# Patient Record
Sex: Female | Born: 1975 | Race: White | Hispanic: No | Marital: Married | Smoking: Current every day smoker
Health system: Southern US, Community
[De-identification: ages and names within clinical notes are randomized; demographics above are authoritative.]

## PROBLEM LIST (undated history)

## (undated) DIAGNOSIS — I639 Cerebral infarction, unspecified: Secondary | ICD-10-CM

## (undated) DIAGNOSIS — K219 Gastro-esophageal reflux disease without esophagitis: Secondary | ICD-10-CM

## (undated) DIAGNOSIS — K315 Obstruction of duodenum: Secondary | ICD-10-CM

## (undated) DIAGNOSIS — S82851A Displaced trimalleolar fracture of right lower leg, initial encounter for closed fracture: Secondary | ICD-10-CM

## (undated) DIAGNOSIS — I10 Essential (primary) hypertension: Secondary | ICD-10-CM

## (undated) HISTORY — PX: TEMPOROMANDIBULAR JOINT SURGERY: SHX35

## (undated) HISTORY — PX: TUMOR REMOVAL: SHX12

## (undated) HISTORY — DX: Essential (primary) hypertension: I10

---

## 1998-07-22 ENCOUNTER — Other Ambulatory Visit: Admission: RE | Admit: 1998-07-22 | Discharge: 1998-07-22 | Payer: Self-pay | Admitting: Obstetrics and Gynecology

## 1999-07-14 ENCOUNTER — Encounter: Payer: Self-pay | Admitting: Family Medicine

## 1999-07-14 ENCOUNTER — Ambulatory Visit (HOSPITAL_COMMUNITY): Admission: RE | Admit: 1999-07-14 | Discharge: 1999-07-14 | Payer: Self-pay | Admitting: Family Medicine

## 1999-08-08 ENCOUNTER — Other Ambulatory Visit: Admission: RE | Admit: 1999-08-08 | Discharge: 1999-08-08 | Payer: Self-pay | Admitting: Obstetrics and Gynecology

## 2001-07-08 ENCOUNTER — Other Ambulatory Visit: Admission: RE | Admit: 2001-07-08 | Discharge: 2001-07-08 | Payer: Self-pay | Admitting: Obstetrics and Gynecology

## 2002-12-10 HISTORY — PX: REPLACEMENT TOTAL KNEE BILATERAL: SUR1225

## 2003-08-30 ENCOUNTER — Other Ambulatory Visit: Admission: RE | Admit: 2003-08-30 | Discharge: 2003-08-30 | Payer: Self-pay | Admitting: Obstetrics and Gynecology

## 2004-08-17 ENCOUNTER — Inpatient Hospital Stay (HOSPITAL_COMMUNITY): Admission: AD | Admit: 2004-08-17 | Discharge: 2004-08-19 | Payer: Self-pay | Admitting: *Deleted

## 2004-12-26 ENCOUNTER — Emergency Department (HOSPITAL_COMMUNITY): Admission: EM | Admit: 2004-12-26 | Discharge: 2004-12-26 | Payer: Self-pay | Admitting: Emergency Medicine

## 2011-07-26 ENCOUNTER — Telehealth: Payer: Self-pay | Admitting: Internal Medicine

## 2011-07-26 NOTE — Telephone Encounter (Signed)
Patient is at [facility].  She reports that she has several day history of RUQ pain, diaphoresis with walking and vomiting.  She reports when she eats or drinks anything but water she has vomiting.  She is not sure if she has a fever or not.  She is advised that she should be seen at an ER or Urgent Care in [facility] today.  She will call when she returns to schedule an appt with Dr Leone Payor if needed

## 2011-07-27 ENCOUNTER — Emergency Department (HOSPITAL_COMMUNITY): Payer: BC Managed Care – PPO

## 2011-07-27 ENCOUNTER — Observation Stay (HOSPITAL_COMMUNITY)
Admission: EM | Admit: 2011-07-27 | Discharge: 2011-07-29 | Disposition: A | Discharge status: Home / Self Care | Payer: BC Managed Care – PPO | Attending: General Surgery | Admitting: General Surgery

## 2011-07-27 DIAGNOSIS — R1011 Right upper quadrant pain: Secondary | ICD-10-CM

## 2011-07-27 DIAGNOSIS — I10 Essential (primary) hypertension: Secondary | ICD-10-CM | POA: Insufficient documentation

## 2011-07-27 DIAGNOSIS — K8010 Calculus of gallbladder with chronic cholecystitis without obstruction: Principal | ICD-10-CM | POA: Insufficient documentation

## 2011-07-27 DIAGNOSIS — R112 Nausea with vomiting, unspecified: Secondary | ICD-10-CM

## 2011-07-27 DIAGNOSIS — R1013 Epigastric pain: Secondary | ICD-10-CM

## 2011-07-27 LAB — URINALYSIS, ROUTINE W REFLEX MICROSCOPIC
Bilirubin Urine: NEGATIVE
Glucose, UA: NEGATIVE mg/dL
Ketones, ur: 15 mg/dL — AB
Leukocytes, UA: NEGATIVE
Nitrite: NEGATIVE
Protein, ur: NEGATIVE mg/dL
Specific Gravity, Urine: 1.01 (ref 1.005–1.030)
Urobilinogen, UA: 0.2 mg/dL (ref 0.0–1.0)
pH: 8.5 — ABNORMAL HIGH (ref 5.0–8.0)

## 2011-07-27 LAB — DIFFERENTIAL
Basophils Absolute: 0 10*3/uL (ref 0.0–0.1)
Basophils Relative: 0 % (ref 0–1)
Eosinophils Absolute: 0.1 10*3/uL (ref 0.0–0.7)
Eosinophils Relative: 1 % (ref 0–5)
Lymphocytes Relative: 26 % (ref 12–46)
Lymphs Abs: 3.7 10*3/uL (ref 0.7–4.0)
Monocytes Absolute: 1.2 10*3/uL — ABNORMAL HIGH (ref 0.1–1.0)
Monocytes Relative: 8 % (ref 3–12)
Neutro Abs: 9.6 10*3/uL — ABNORMAL HIGH (ref 1.7–7.7)
Neutrophils Relative %: 66 % (ref 43–77)

## 2011-07-27 LAB — COMPREHENSIVE METABOLIC PANEL
Albumin: 4.1 g/dL (ref 3.5–5.2)
BUN: 23 mg/dL (ref 6–23)
Calcium: 9.9 mg/dL (ref 8.4–10.5)
Creatinine, Ser: 0.94 mg/dL (ref 0.50–1.10)
GFR calc Af Amer: >60 mL/min (ref >60)
Potassium: 3.6 mEq/L (ref 3.5–5.1)
Total Protein: 7.9 g/dL (ref 6.0–8.3)

## 2011-07-27 LAB — LIPASE, BLOOD: Lipase: 34 U/L (ref 11–59)

## 2011-07-27 LAB — URINE MICROSCOPIC-ADD ON

## 2011-07-27 LAB — CBC
HCT: 40.2 % (ref 36.0–46.0)
MCH: 31.8 pg (ref 26.0–34.0)
MCHC: 36.1 g/dL — ABNORMAL HIGH (ref 30.0–36.0)
RDW: 12.9 % (ref 11.5–15.5)

## 2011-07-27 LAB — POCT PREGNANCY, URINE: Preg Test, Ur: NEGATIVE

## 2011-07-28 ENCOUNTER — Other Ambulatory Visit (INDEPENDENT_AMBULATORY_CARE_PROVIDER_SITE_OTHER): Payer: Self-pay | Admitting: General Surgery

## 2011-07-28 DIAGNOSIS — K8010 Calculus of gallbladder with chronic cholecystitis without obstruction: Secondary | ICD-10-CM

## 2011-07-28 HISTORY — PX: CHOLECYSTECTOMY: SHX55

## 2011-07-28 LAB — COMPREHENSIVE METABOLIC PANEL
ALT: 6 U/L (ref 0–35)
Alkaline Phosphatase: 65 U/L (ref 39–117)
BUN: 14 mg/dL (ref 6–23)
Chloride: 104 mEq/L (ref 96–112)
GFR calc Af Amer: >60 mL/min (ref >60)
Glucose, Bld: 75 mg/dL (ref 70–99)
Potassium: 3.9 mEq/L (ref 3.5–5.1)
Sodium: 138 mEq/L (ref 135–145)
Total Bilirubin: 0.2 mg/dL — ABNORMAL LOW (ref 0.3–1.2)

## 2011-07-28 LAB — CBC
HCT: 34 % — ABNORMAL LOW (ref 36.0–46.0)
Hemoglobin: 11.4 g/dL — ABNORMAL LOW (ref 12.0–15.0)
RBC: 3.75 MIL/uL — ABNORMAL LOW (ref 3.87–5.11)
WBC: 7.6 10*3/uL (ref 4.0–10.5)

## 2011-07-28 LAB — SURGICAL PCR SCREEN: MRSA, PCR: NEGATIVE

## 2011-07-28 NOTE — Op Note (Signed)
  NAMEARAIYAH, [name] Barnett                ACCOUNT NO.:  [id number]  MEDICAL RECORD NO.:  [id number]  LOCATION:  5149                         FACILITY:  [facility]  PHYSICIAN:  Juanetta Gosling, MDDATE OF BIRTH:  [birth year]  DATE OF PROCEDURE:  07/28/2011 DATE OF DISCHARGE:                              OPERATIVE REPORT   PREOPERATIVE DIAGNOSIS:  Acute cholecystitis.  POSTOPERATIVE DIAGNOSIS:  Acute cholecystitis.  PROCEDURE:  Laparoscopic cholecystectomy.  SURGEON:  Juanetta Gosling, MD  ASSISTANT:  None.  ANESTHESIA:  General.  ANESTHESIOLOGIST:  Quita Skye. Krista Blue, MD  SPECIMENS:  Gallbladder to Pathology.  ESTIMATED BLOOD LOSS:  Minimal.  COMPLICATIONS:  None.  DRAINS:  None.  DISPOSITION:  To recovery room in stable condition.  INDICATIONS:  This is a 35 year old female with about a year of intermittent right upper quadrant pain that has become progressive over the last week.  Her ultrasound shows stones and tenderness on her examination.  Her exam has a Murphy's sign.  Her white blood cell count was mildly elevated.  Liver function tests was normal.  She was admitted last night and I met her this morning and we discussed laparoscopic cholecystectomy and the risks and benefits associated with that procedure.  PROCEDURE: After informed consent was obtained, the patient was taken to the operating room.  She was being administered Zosyn on the floor.  She had sequential compression devices placed on lower extremities prior to induction with anesthesia.  She was then placed under general anesthesia without complication.  Her abdomen was prepped and draped in a standard sterile surgical fashion.  A surgical time-out was then performed.  I then infiltrated 0.25% Marcaine below her umbilicus.  I made a vertical incision.  I carried this down the fascia.  This was entered sharply.  The peritoneum was entered bluntly.  A 0 Vicryl purse-string suture was placed through the  fascia.  I then inserted three other 5-mm trocars in the epigastrium and right upper quadrant under direct vision after infiltration with local anesthetic without complication.  Her gallbladder was then retracted cephalad and lateral.  I dissected the triangle of Calot.  I clearly was able to obtain a critical view of safety.  I then clipped her duct and artery and divided them.  I then removed her gallbladder from the liver bed without any difficulty.  This was placed in Endocatch bag and removed from the umbilicus.  The liver bed was then made hemostatic.  Irrigation was performed and this was clear.  I removed the umbilical trocar and I tied this down.  This obliterated the defect.  I viewed this from the epigastric port.  There was no evidence of an entry injury.  I then desufflated the abdomen and removed all ports.  They were closed with 4-0 Monocryl and Dermabond. She tolerated this well, was extubated in the operating room, and transferred to the recovery room in stable condition.     Juanetta Gosling, MD     MCW/MEDQ  D:  07/28/2011  T:  07/28/2011  Job:  [id number]  Electronically Signed by Emelia Loron MD on 07/28/2011 04:43:44 PM

## 2011-07-29 NOTE — H&P (Signed)
[name] Barnett, [name] Barnett                ACCOUNT NO.:  [id number]  MEDICAL RECORD NO.:  [id number]  LOCATION:  5149                         FACILITY:  [facility]  PHYSICIAN:  Abigail Miyamoto, M.D. DATE OF BIRTH:  02/10/1976  DATE OF ADMISSION:  07/27/2011 DATE OF DISCHARGE:                             HISTORY & PHYSICAL   CHIEF COMPLAINT:  Right upper quadrant abdominal pain.  HISTORY:  This is a 35 year old female who presents with a 1-week history of epigastric and right upper quadrant abdominal pain.  She reports the pain is sharp and hurts through to her back.  She has had nausea, vomiting with this for most of the week.  She has also been constipated.  The pain is moderate to severe.  She had been having similar attacks over the past year.  She has difficult time related to fatty meals.  Otherwise, she has no complaints.  She denies fevers or chills.  PAST MEDICAL HISTORY:  Hypertension.  PAST SURGICAL HISTORY:  Negative.  MEDICATIONS:  Lisinopril.  ALLERGIES:  No known drug allergies.  FAMILY HISTORY:  Positive for hypertension.  SOCIAL HISTORY:  She denies smoking or alcohol use.  REVIEW OF SYSTEMS:  GENERAL:  Negative for fever or chills.  PULMONARY: Negative for cough, shortness of breath, or difficulty breathing. CARDIAC:  Negative for chest pain or irregular heartbeat.  Is positive for hypertension.  ABDOMEN:  Listed as above.  There is no hematemesis. Bowel movements are normal up until recently.  URINARY:  Negative for dysuria or hematuria.  The rest of the review of systems including skin, eyes, ears, nose and throat, musculoskeletal, neurologic, psychiatric, endocrine are normal.  PHYSICAL EXAMINATION:  ; GENERAL:  A well-developed, well-nourished female in no acute distress. VITAL SIGNS:  Temperature 98.1, pulse 74, respiratory rate 18, blood pressure is 139/62. EYES:  Anicteric.  Pupils are reactive bilaterally. ENT:  External ears and nose are normal.   Hearing is normal.  Oropharynx clear. NECK:  Supple.  Trachea is midline.  There is no thyromegaly. LUNGS:  Clear to auscultation bilaterally with normal respiratory effort. CARDIOVASCULAR:  Regular rate and rhythm.  There are no murmurs.  There is no peripheral edema. ABDOMEN:  Soft.  There is tenderness with guarding in the epigastric and right upper quadrant.  There are no masses.  There is no organomegaly. There are no hernias. EXTREMITIES:  Warm, well-perfused.  No edema, clubbing or cyanosis. Peripheral pulses are intact to all four extremities.  Strength is normal in all four extremities.  NEUROLOGIC:  Awake, alert and oriented. Her Glasgow Coma Scale is 15.  Motor and sensory functions are gross intact in all four extremities. SKIN:  No rashes and no jaundice.  DATA REVIEWED:  The patient had an ultrasound of the abdomen showing to have cholelithiasis, bile ducts mildly dilated at 7 mm.  There is no gallbladder thickening, no pericholecystic fluid.  There is mild bilateral hydronephrosis.  LABORATORY DATA:  White blood cell count 14.6, hemoglobin is 14.5, platelets are 441.  Electrolytes show a sodium of 141, potassium is 3.6, chloride 77, creatinine 0.94.  Liver function tests show a bilirubin 0.3, alk phos 94, AST 13, ALT 8.  Urinalysis shows 0-2 white blood cells, 0-2 red blood cells.  IMPRESSION:  The patient with cholecystitis with cholelithiasis.  At this point, she had been admitted to the hospital for intravenous antibiotics and bowel rest and eventual laparoscopic cholecystectomy, we will correct her electrolytes.  She may need further investigation of the hydronephrosis.  This may be compensated as an outpatient.     Abigail Miyamoto, M.D.     DB/MEDQ  D:  07/27/2011  T:  07/28/2011  Job:  [id number]  Electronically Signed by Abigail Miyamoto M.D. on 07/29/2011 05:41:00 PM

## 2011-08-07 ENCOUNTER — Emergency Department (HOSPITAL_COMMUNITY)
Admission: EM | Admit: 2011-08-07 | Discharge: 2011-08-07 | Disposition: A | Discharge status: Home / Self Care | Payer: BC Managed Care – PPO | Attending: Emergency Medicine | Admitting: Emergency Medicine

## 2011-08-07 ENCOUNTER — Telehealth (INDEPENDENT_AMBULATORY_CARE_PROVIDER_SITE_OTHER): Payer: Self-pay

## 2011-08-07 ENCOUNTER — Emergency Department (HOSPITAL_COMMUNITY): Payer: BC Managed Care – PPO

## 2011-08-07 DIAGNOSIS — R112 Nausea with vomiting, unspecified: Secondary | ICD-10-CM | POA: Insufficient documentation

## 2011-08-07 DIAGNOSIS — R109 Unspecified abdominal pain: Secondary | ICD-10-CM | POA: Insufficient documentation

## 2011-08-07 DIAGNOSIS — Z9089 Acquired absence of other organs: Secondary | ICD-10-CM | POA: Insufficient documentation

## 2011-08-07 DIAGNOSIS — K219 Gastro-esophageal reflux disease without esophagitis: Secondary | ICD-10-CM | POA: Insufficient documentation

## 2011-08-07 LAB — URINE MICROSCOPIC-ADD ON

## 2011-08-07 LAB — DIFFERENTIAL
Eosinophils Relative: 2 % (ref 0–5)
Lymphocytes Relative: 37 % (ref 12–46)
Lymphs Abs: 4.5 10*3/uL — ABNORMAL HIGH (ref 0.7–4.0)
Monocytes Absolute: 1.1 10*3/uL — ABNORMAL HIGH (ref 0.1–1.0)
Monocytes Relative: 9 % (ref 3–12)
Neutro Abs: 6.4 10*3/uL (ref 1.7–7.7)

## 2011-08-07 LAB — CBC
HCT: 39.4 % (ref 36.0–46.0)
Hemoglobin: 13.5 g/dL (ref 12.0–15.0)
MCH: 30.8 pg (ref 26.0–34.0)
MCHC: 34.3 g/dL (ref 30.0–36.0)
MCV: 90 fL (ref 78.0–100.0)
RDW: 13.5 % (ref 11.5–15.5)

## 2011-08-07 LAB — COMPREHENSIVE METABOLIC PANEL
ALT: 8 U/L (ref 0–35)
AST: 11 U/L (ref 0–37)
Alkaline Phosphatase: 79 U/L (ref 39–117)
Calcium: 10.1 mg/dL (ref 8.4–10.5)
Glucose, Bld: 84 mg/dL (ref 70–99)
Potassium: 3.7 mEq/L (ref 3.5–5.1)
Sodium: 131 mEq/L — ABNORMAL LOW (ref 135–145)
Total Protein: 7.4 g/dL (ref 6.0–8.3)

## 2011-08-07 LAB — URINALYSIS, ROUTINE W REFLEX MICROSCOPIC
Glucose, UA: NEGATIVE mg/dL
Hgb urine dipstick: NEGATIVE
Protein, ur: NEGATIVE mg/dL
Specific Gravity, Urine: 1.016 (ref 1.005–1.030)
Urobilinogen, UA: 0.2 mg/dL (ref 0.0–1.0)

## 2011-08-07 MED ORDER — IOHEXOL 300 MG/ML  SOLN
100.0000 mL | Freq: Once | INTRAMUSCULAR | Status: AC | PRN
  Administered 2011-08-07: 100 mL via INTRAVENOUS

## 2011-08-07 NOTE — Telephone Encounter (Signed)
[name] Barnett called reporting that for the last week she's had vomiting after meals and unable to keep food down (s/p lap chole 07/28/11) Dr. Doreen Salvage not available to speak with regarding this matter.  Per Dr. Luisa Hart (urgent office) patient need's to go to the ED for further evaluation.  Patient was notified and has agreed to the above.

## 2011-08-08 ENCOUNTER — Telehealth (INDEPENDENT_AMBULATORY_CARE_PROVIDER_SITE_OTHER): Payer: Self-pay

## 2011-08-08 NOTE — Telephone Encounter (Signed)
Alisha, Could you please follow up on what happened with Mrs. Boivin? Freddi Starr

## 2011-08-08 NOTE — Telephone Encounter (Signed)
LMOM at work and no answer at home. I was calling to check on her to see how she was feeling today since I saw she went to the ER yesterday. Just want to see how she is doing./ AHS

## 2011-08-08 NOTE — Telephone Encounter (Signed)
I called pt but no answer at home and she is not at work which I expected. I read the ER notes which told her it was GERD and for her to see a GI doctor./ AHS

## 2011-08-09 ENCOUNTER — Telehealth (INDEPENDENT_AMBULATORY_CARE_PROVIDER_SITE_OTHER): Payer: Self-pay

## 2011-08-09 DIAGNOSIS — R112 Nausea with vomiting, unspecified: Secondary | ICD-10-CM

## 2011-08-09 MED ORDER — ONDANSETRON 8 MG PO TBDP: 8.0000 mg | ORAL_TABLET | Freq: Four times a day (QID) | ORAL | Status: AC

## 2011-08-09 NOTE — Telephone Encounter (Signed)
Called pt to check on her after she had to go to the ER the other day b/c N&V. The pt is still not completely better she couldn't go to work today b/c of the nausea. The pt has been taking the nausea medication and did help some that she was able to eat and drink yesterday. I contacted Dr Dwain Sarna to advise him of the pt's condition and we went over the pt's labs and xray report again which are normal. Per Dr Dwain Sarna he wants pt to stay on liquids and take her nausea medication every 6hrs and touch base with her tomorrow to see if any improvement. I advised the pt she needed to be on some sort of laxative to get her bowels moving and the pt is taking something for that now. I will call her in a refill of her nausea medication to [facility]./ AHS

## 2011-08-09 NOTE — Telephone Encounter (Signed)
E-filed Rx to [facility]

## 2011-08-10 LAB — URINE CULTURE

## 2011-08-20 ENCOUNTER — Encounter (INDEPENDENT_AMBULATORY_CARE_PROVIDER_SITE_OTHER): Payer: Self-pay | Admitting: General Surgery

## 2011-08-21 ENCOUNTER — Telehealth (INDEPENDENT_AMBULATORY_CARE_PROVIDER_SITE_OTHER): Payer: Self-pay | Admitting: General Surgery

## 2011-08-21 ENCOUNTER — Encounter (INDEPENDENT_AMBULATORY_CARE_PROVIDER_SITE_OTHER): Payer: BC Managed Care – PPO | Admitting: General Surgery

## 2011-08-21 NOTE — Telephone Encounter (Signed)
Pt did not show for follow up, she is unable to RS until 08/30/11. Pt states she is doing fine and has had no problems.

## 2011-08-30 ENCOUNTER — Ambulatory Visit (INDEPENDENT_AMBULATORY_CARE_PROVIDER_SITE_OTHER): Payer: BC Managed Care – PPO | Admitting: General Surgery

## 2011-08-30 ENCOUNTER — Encounter (INDEPENDENT_AMBULATORY_CARE_PROVIDER_SITE_OTHER): Payer: Self-pay | Admitting: General Surgery

## 2011-08-30 VITALS — BP 118/74 | HR 82 | Temp 98.6°F | Resp 18 | Ht 64.5 in | Wt 132.0 lb

## 2011-08-30 DIAGNOSIS — Z09 Encounter for follow-up examination after completed treatment for conditions other than malignant neoplasm: Secondary | ICD-10-CM

## 2011-08-30 NOTE — Progress Notes (Signed)
Subjective:     Patient ID: [name] Barnett, female   DOB: 06-17-1976, 35 y.o.   MRN: [id number]  HPI This is a 35 year old female who I met while on call to [facility]. She had acute cholecystitis. I took her to the operating room and performed a  laparoscopic cholecystectomy with the pathology returning as chronic cholecystitis and cholelithiasis. She had some nausea and vomiting postoperatively which is now resolved. She is now doing much better without any significant pain and she her appetite is improving. She has no real significant complaints today and feels much better.  Review of Systems     Objective:   Physical Exam Well healing incisions without infection, nontender    Assessment:     S/p lap chole    Plan:     Return to full activity and return to see me as needed.

## 2011-09-11 ENCOUNTER — Ambulatory Visit (HOSPITAL_COMMUNITY)
Admission: RE | Admit: 2011-09-11 | Discharge: 2011-09-11 | Disposition: A | Discharge status: Home / Self Care | Payer: BC Managed Care – PPO | Source: Ambulatory Visit | Attending: Surgery | Admitting: Surgery

## 2011-09-11 ENCOUNTER — Other Ambulatory Visit (INDEPENDENT_AMBULATORY_CARE_PROVIDER_SITE_OTHER): Payer: Self-pay | Admitting: General Surgery

## 2011-09-11 DIAGNOSIS — R109 Unspecified abdominal pain: Secondary | ICD-10-CM | POA: Insufficient documentation

## 2011-09-11 DIAGNOSIS — K5989 Other specified functional intestinal disorders: Secondary | ICD-10-CM | POA: Insufficient documentation

## 2011-09-11 LAB — CREATININE, SERUM: Creatinine, Ser: 1.06 mg/dL (ref 0.50–1.10)

## 2011-09-11 MED ORDER — IOHEXOL 300 MG/ML  SOLN
100.0000 mL | Freq: Once | INTRAMUSCULAR | Status: AC | PRN
  Administered 2011-09-11: 100 mL via INTRAVENOUS

## 2011-09-12 ENCOUNTER — Encounter (INDEPENDENT_AMBULATORY_CARE_PROVIDER_SITE_OTHER): Payer: Self-pay | Admitting: General Surgery

## 2011-09-12 ENCOUNTER — Telehealth (INDEPENDENT_AMBULATORY_CARE_PROVIDER_SITE_OTHER): Payer: Self-pay | Admitting: General Surgery

## 2011-09-12 ENCOUNTER — Ambulatory Visit (INDEPENDENT_AMBULATORY_CARE_PROVIDER_SITE_OTHER): Payer: BC Managed Care – PPO | Admitting: General Surgery

## 2011-09-12 VITALS — BP 132/74 | HR 104 | Temp 98.4°F | Resp 16 | Ht 64.0 in | Wt 128.2 lb

## 2011-09-12 DIAGNOSIS — Z09 Encounter for follow-up examination after completed treatment for conditions other than malignant neoplasm: Secondary | ICD-10-CM

## 2011-09-12 LAB — COMPREHENSIVE METABOLIC PANEL
Albumin: 3.8 g/dL (ref 3.5–5.2)
Alkaline Phosphatase: 79 U/L (ref 39–117)
BUN: 16 mg/dL (ref 6–23)
Calcium: 8.7 mg/dL (ref 8.4–10.5)
Creat: 1.18 mg/dL — ABNORMAL HIGH (ref 0.50–1.10)
Glucose, Bld: 97 mg/dL (ref 70–99)
Potassium: 3.3 mEq/L — ABNORMAL LOW (ref 3.5–5.3)

## 2011-09-12 MED ORDER — ZOLPIDEM TARTRATE 5 MG PO TABS: 5.0000 mg | ORAL_TABLET | Freq: Every evening | ORAL | Status: DC | PRN

## 2011-09-12 MED ORDER — PROMETHAZINE HCL 12.5 MG PO TABS: 12.5000 mg | ORAL_TABLET | Freq: Four times a day (QID) | ORAL | Status: AC | PRN

## 2011-09-12 NOTE — Telephone Encounter (Signed)
PT RETURNED MY CALL TO HER FROM THIS AM. ABDOMINAL PAIN HAS IMPROVED. PT SCHEDULED TO SEE DR. Dwain Sarna TODAY IN URGENT OFFICE.

## 2011-09-12 NOTE — Telephone Encounter (Signed)
I LEFT VOICE MESSAGE FOR MS Weiskopf TO CALL REGARDING HER CT SCAN AND LAB RESULTS DONE YESTERDAY 09-11-11 EVENING.

## 2011-09-12 NOTE — Progress Notes (Signed)
Subjective:     Patient ID: [name] Barnett, female   DOB: [birth year], 35 y.o.   MRN: [id number]  HPI This is a 57 she'll female who I recently did a laparoscopic cholecystectomy on for acute cholecystitis. She did well she had some initial pain afterwards that resolved. The last couple of days she's had recurrence of some right upper quadrant pain associated with some back pain. She has had some nausea as well as some emesis. She is able to keep things down and is eating now. She was discussed yesterday by one of my borders who sent her for to get a CT scan which essentially was normal except for a possible abnormality around her duodenum. She also had a BUN and creatinine had no liver function tests or lipase. She comes in today still complaining of feeling poorly unable to sleep with some abdominal pain.  CT ABDOMEN AND PELVIS WITH CONTRAST  Technique: Multidetector CT imaging of the abdomen and pelvis was  performed following the standard protocol during bolus  administration of intravenous contrast.  Contrast: OMNIPAQUE IOHEXOL 300 MG/ML IV SOLN  Comparison: CT abdomen pelvis - 08/07/2011  Findings:  Normal hepatic contour. No discrete hepatic lesion. Post  cholecystectomy. Interval resolution of stranding within the  cholecystectomy bed. No intra or extrahepatic biliary ductal  dilatation. Hepatic vasculature is patent. No ascites.  There is symmetric enhancement of the bilateral kidneys. No  evidence of urinary obstruction. No discrete renal lesions.  Bilateral adrenal glands are normal.  There is mild enlargement of the duodenal bulb and descending  portion of doudenum, without evidence of enteric obstruction. The  sigmoid colon is noted to be redundant. The bowel is otherwise  normal in course and caliber without wall thickening or evidence of  obstruction. Normal appendix. No pneumoperitoneum, pneumatosis or  portal venous gas. There is scattered minimal atherosclerotic    calcifications of a normal caliber abdominal aorta. The major  branch vessels of the abdominal aorta, including the IMA, are  patent. No retroperitoneal, mesenteric, pelvic or inguinal  lymphadenopathy.  Left-sided approximately 1.7 cm adnexal cyst, likely physiologic.  No discrete right-sided adnexal lesions. Small amount of fluid  within the endometrial canal, likely physiologic. No free fluid  within the pelvis.  Limited visualization of the lower thorax is negative for focal  airspace opacity. Normal heart size. No pericardial effusion.  No acute aggressive osseous abnormalities.  IMPRESSION:  1. Post cholecystectomy without evidence of complication. No  intra or extra panic biliary ductal dilatation. Interval  resolution of small amount with fluid within the cholecystectomy  bed and tiny amount of residual pneumoperitoneum.  2. Mild dilatation of the duodenal bulb and descending portion of  the duodenum of uncertain clinical significance. No evidence of  enteric obstruction. Further evaluation may be performed with an  upper GI or endoscopy as clinically indicated.  3. Likely physiologic 1.7 cm left-sided adnexal cyst.     Review of Systems     Objective:   Physical Exam mild tenderness ruq   Assessment:     RUQ pain s/p cholecystectomy    Plan:     She clearly had acute cholecystitis when I took her gallbladder out. I don't think this is a complication of her surgery she has a normal CT scan at this point with no fluid or biliary dilatation. I am going to send her today to get a lipase as well as a liver function panel. Also to have her see gastroenterology this week  to evaluate for possible EGD and evaluation to see if there is another source of her pain and am unable to identify today.

## 2011-09-13 ENCOUNTER — Telehealth (INDEPENDENT_AMBULATORY_CARE_PROVIDER_SITE_OTHER): Payer: Self-pay | Admitting: General Surgery

## 2011-09-13 NOTE — Telephone Encounter (Signed)
I called patient and left message on machine for her to call me back re: labs.

## 2011-09-13 NOTE — Telephone Encounter (Signed)
Message copied by Liliana Cline on Thu Sep 13, 2011 10:13 AM ------      Message from: Dwain Sarna, MATTHEW      Created: Thu Sep 13, 2011  8:31 AM       Would you call her and ask how she is doing? Her liver tests are fine but this shows she isn't taking anything in and/or vomiting.  If she is not taking in at least liquids right now she is going to possibly have to come to hospital.  She should have these rechecked maybe Friday.      MW      ----- Message -----         From: [facility]         Sent: 09/12/2011  11:01 PM           To: Emelia Loron, MD

## 2011-09-14 ENCOUNTER — Telehealth (INDEPENDENT_AMBULATORY_CARE_PROVIDER_SITE_OTHER): Payer: Self-pay

## 2011-09-14 NOTE — Telephone Encounter (Signed)
Called pt to notify her that her labs were ok. The pt was at her scheduled appt with Dr Bosie Clos today. The pt was actually feeling better today she said the pain stopped hurting last night. I advised her to call us if we could help anymore./ AHS

## 2011-09-14 NOTE — Telephone Encounter (Signed)
Patient called back for lab results and left message on my voicemail.

## 2011-10-16 ENCOUNTER — Emergency Department (HOSPITAL_COMMUNITY): Payer: BC Managed Care – PPO

## 2011-10-16 ENCOUNTER — Inpatient Hospital Stay (HOSPITAL_COMMUNITY)
Admission: EM | Admit: 2011-10-16 | Discharge: 2011-10-20 | DRG: 176 | Disposition: A | Discharge status: Home / Self Care | Payer: BC Managed Care – PPO | Attending: Internal Medicine | Admitting: Internal Medicine

## 2011-10-16 ENCOUNTER — Encounter (HOSPITAL_COMMUNITY): Payer: Self-pay | Admitting: *Deleted

## 2011-10-16 ENCOUNTER — Other Ambulatory Visit: Payer: Self-pay

## 2011-10-16 DIAGNOSIS — N179 Acute kidney failure, unspecified: Secondary | ICD-10-CM | POA: Diagnosis present

## 2011-10-16 DIAGNOSIS — E873 Alkalosis: Secondary | ICD-10-CM | POA: Diagnosis present

## 2011-10-16 DIAGNOSIS — R1115 Cyclical vomiting syndrome unrelated to migraine: Secondary | ICD-10-CM | POA: Diagnosis present

## 2011-10-16 DIAGNOSIS — K263 Acute duodenal ulcer without hemorrhage or perforation: Principal | ICD-10-CM | POA: Diagnosis present

## 2011-10-16 DIAGNOSIS — Z9049 Acquired absence of other specified parts of digestive tract: Secondary | ICD-10-CM | POA: Insufficient documentation

## 2011-10-16 DIAGNOSIS — I10 Essential (primary) hypertension: Secondary | ICD-10-CM | POA: Diagnosis present

## 2011-10-16 DIAGNOSIS — E876 Hypokalemia: Secondary | ICD-10-CM | POA: Diagnosis present

## 2011-10-16 DIAGNOSIS — Z09 Encounter for follow-up examination after completed treatment for conditions other than malignant neoplasm: Secondary | ICD-10-CM

## 2011-10-16 DIAGNOSIS — R1110 Vomiting, unspecified: Secondary | ICD-10-CM

## 2011-10-16 DIAGNOSIS — R112 Nausea with vomiting, unspecified: Secondary | ICD-10-CM | POA: Diagnosis present

## 2011-10-16 DIAGNOSIS — D649 Anemia, unspecified: Secondary | ICD-10-CM | POA: Diagnosis present

## 2011-10-16 DIAGNOSIS — F17200 Nicotine dependence, unspecified, uncomplicated: Secondary | ICD-10-CM | POA: Diagnosis present

## 2011-10-16 DIAGNOSIS — N289 Disorder of kidney and ureter, unspecified: Secondary | ICD-10-CM | POA: Diagnosis present

## 2011-10-16 DIAGNOSIS — Z720 Tobacco use: Secondary | ICD-10-CM | POA: Diagnosis present

## 2011-10-16 DIAGNOSIS — E869 Volume depletion, unspecified: Secondary | ICD-10-CM | POA: Diagnosis present

## 2011-10-16 DIAGNOSIS — I4891 Unspecified atrial fibrillation: Secondary | ICD-10-CM | POA: Diagnosis present

## 2011-10-16 HISTORY — DX: Gastro-esophageal reflux disease without esophagitis: K21.9

## 2011-10-16 LAB — CBC
HCT: 41.2 % (ref 36.0–46.0)
Hemoglobin: 14.4 g/dL (ref 12.0–15.0)
Hemoglobin: 10.6 g/dL — ABNORMAL LOW (ref 12.0–15.0)
MCH: 31.6 pg (ref 26.0–34.0)
MCH: 31.2 pg (ref 26.0–34.0)
MCHC: 35 g/dL (ref 30.0–36.0)
MCV: 90.5 fL (ref 78.0–100.0)
Platelets: 290 10*3/uL (ref 150–400)
RBC: 4.55 MIL/uL (ref 3.87–5.11)
RBC: 3.4 MIL/uL — ABNORMAL LOW (ref 3.87–5.11)

## 2011-10-16 LAB — URINALYSIS, ROUTINE W REFLEX MICROSCOPIC
Leukocytes, UA: NEGATIVE
Nitrite: NEGATIVE
Specific Gravity, Urine: 1.022 (ref 1.005–1.030)
Urobilinogen, UA: 1 mg/dL (ref 0.0–1.0)
pH: 7 (ref 5.0–8.0)

## 2011-10-16 LAB — COMPREHENSIVE METABOLIC PANEL
Albumin: 3.8 g/dL (ref 3.5–5.2)
Alkaline Phosphatase: 111 U/L (ref 39–117)
BUN: 33 mg/dL — ABNORMAL HIGH (ref 6–23)
Calcium: 10.5 mg/dL (ref 8.4–10.5)
Creatinine, Ser: 1.91 mg/dL — ABNORMAL HIGH (ref 0.50–1.10)
GFR calc Af Amer: 38 mL/min — ABNORMAL LOW (ref >90)
Glucose, Bld: 90 mg/dL (ref 70–99)
Total Protein: 7.4 g/dL (ref 6.0–8.3)

## 2011-10-16 LAB — BASIC METABOLIC PANEL
BUN: 23 mg/dL (ref 6–23)
CO2: 28 mEq/L (ref 19–32)
Chloride: 96 mEq/L (ref 96–112)
GFR calc Af Amer: 72 mL/min — ABNORMAL LOW (ref >90)
Potassium: 3 mEq/L — ABNORMAL LOW (ref 3.5–5.1)

## 2011-10-16 LAB — DIFFERENTIAL
Basophils Relative: 0 % (ref 0–1)
Eosinophils Absolute: 0.2 10*3/uL (ref 0.0–0.7)
Eosinophils Relative: 2 % (ref 0–5)
Lymphs Abs: 3.6 10*3/uL (ref 0.7–4.0)
Monocytes Absolute: 1 10*3/uL (ref 0.1–1.0)
Monocytes Relative: 10 % (ref 3–12)
Neutrophils Relative %: 52 % (ref 43–77)

## 2011-10-16 LAB — CREATININE, SERUM
Creatinine, Ser: 1.37 mg/dL — ABNORMAL HIGH (ref 0.50–1.10)
GFR calc non Af Amer: 49 mL/min — ABNORMAL LOW (ref >90)

## 2011-10-16 LAB — LACTIC ACID, PLASMA: Lactic Acid, Venous: 1.4 mmol/L (ref 0.5–2.2)

## 2011-10-16 LAB — RAPID URINE DRUG SCREEN, HOSP PERFORMED
Barbiturates: NOT DETECTED
Cocaine: NOT DETECTED

## 2011-10-16 LAB — URINE MICROSCOPIC-ADD ON

## 2011-10-16 LAB — LIPASE, BLOOD: Lipase: 38 U/L (ref 11–59)

## 2011-10-16 LAB — HIV ANTIBODY (ROUTINE TESTING W REFLEX): HIV: NONREACTIVE

## 2011-10-16 MED ORDER — SODIUM CHLORIDE 0.9 % IV BOLUS (SEPSIS)
1000.0000 mL | Freq: Once | INTRAVENOUS | Status: AC
  Administered 2011-10-16: 1000 mL via INTRAVENOUS

## 2011-10-16 MED ORDER — MORPHINE SULFATE 2 MG/ML IJ SOLN: 1.0000 mg | INTRAMUSCULAR | Status: DC | PRN

## 2011-10-16 MED ORDER — POTASSIUM CHLORIDE 10 MEQ/100ML IV SOLN
10.0000 meq | INTRAVENOUS | Status: AC
  Administered 2011-10-16 (×2): 10 meq via INTRAVENOUS

## 2011-10-16 MED ORDER — POTASSIUM CHLORIDE 10 MEQ/100ML IV SOLN
10.0000 meq | INTRAVENOUS | Status: AC
  Administered 2011-10-16 – 2011-10-17 (×5): 10 meq via INTRAVENOUS
  Filled 2011-10-16 (×5): qty 100

## 2011-10-16 MED ORDER — PANTOPRAZOLE SODIUM 40 MG IV SOLR
40.0000 mg | Freq: Once | INTRAVENOUS | Status: AC
  Administered 2011-10-16: 40 mg via INTRAVENOUS
  Filled 2011-10-16 (×2): qty 40

## 2011-10-16 MED ORDER — SODIUM CHLORIDE 0.9 % IV SOLN
INTRAVENOUS | Status: DC
  Administered 2011-10-16 – 2011-10-18 (×3): via INTRAVENOUS

## 2011-10-16 MED ORDER — ONDANSETRON HCL 4 MG/2ML IJ SOLN
4.0000 mg | Freq: Once | INTRAMUSCULAR | Status: AC
  Administered 2011-10-16: 4 mg via INTRAVENOUS
  Filled 2011-10-16: qty 2

## 2011-10-16 MED ORDER — PROMETHAZINE HCL 25 MG PO TABS
12.5000 mg | ORAL_TABLET | Freq: Four times a day (QID) | ORAL | Status: DC | PRN
  Administered 2011-10-16: 12.5 mg via ORAL
  Filled 2011-10-16: qty 1

## 2011-10-16 MED ORDER — ENOXAPARIN SODIUM 40 MG/0.4ML ~~LOC~~ SOLN
40.0000 mg | SUBCUTANEOUS | Status: DC
Start: 2011-10-16 — End: 2011-10-20
  Administered 2011-10-16 – 2011-10-19 (×4): 40 mg via SUBCUTANEOUS
  Filled 2011-10-16 (×5): qty 0.4

## 2011-10-16 MED ORDER — PROMETHAZINE HCL 25 MG/ML IJ SOLN
12.5000 mg | Freq: Four times a day (QID) | INTRAMUSCULAR | Status: DC | PRN
  Filled 2011-10-16: qty 1

## 2011-10-16 NOTE — ED Notes (Signed)
Pt transported to Xray. 

## 2011-10-16 NOTE — ED Provider Notes (Signed)
History     CSN: [id number] Arrival date & time: 10/16/2011  7:50 AM   First MD Initiated Contact with Patient 10/16/11 [phone]      Chief Complaint  Patient presents with  . Dehydration    (Consider location/radiation/quality/duration/timing/severity/associated sxs/prior treatment) Patient is a 35 y.o. female presenting with vomiting. The history is provided by the patient.  Emesis  This is a recurrent problem. The current episode started more than 1 week ago. The problem occurs 5 to 10 times per day. The problem has been gradually worsening. The emesis has an appearance of stomach contents. There has been no fever. Associated symptoms include diarrhea. Pertinent negatives include no abdominal pain, no chills, no cough, no fever and no headaches.    Past Medical History  Diagnosis Date  . Hypertension     Past Surgical History  Procedure Date  . Cholecystectomy 07/28/11    History reviewed. No pertinent family history.  History  Substance Use Topics  . Smoking status: Current Some Day Smoker -- 1.0 packs/day    Types: Cigarettes  . Smokeless tobacco: Not on file  . Alcohol Use: No    OB History    Grav Para Term Preterm Abortions TAB SAB Ect Mult Living                  Review of Systems  Constitutional: Negative for fever and chills.  Respiratory: Negative for cough.   Gastrointestinal: Positive for vomiting and diarrhea. Negative for abdominal pain.  Genitourinary: Negative for dysuria.  Musculoskeletal: Negative for back pain.  Neurological: Negative for headaches.  All other systems reviewed and are negative.    Allergies  Review of patient's allergies indicates no known allergies.  Home Medications   Current Outpatient Rx  Name Route Sig Dispense Refill  . LISINOPRIL PO Oral Take by mouth daily.        BP 105/91  Pulse 140  Temp(Src) 98.9 F (37.2 C) (Oral)  SpO2 99%  Physical Exam  Nursing note and vitals reviewed. Constitutional: She is  oriented to person, place, and time. She appears well-developed and well-nourished.  HENT:  Head: Normocephalic and atraumatic.  Eyes: Pupils are equal, round, and reactive to light.  Neck: Normal range of motion. Neck supple.  Cardiovascular: Normal heart sounds.        TACHYCARDIC  Pulmonary/Chest: Effort normal and breath sounds normal. No respiratory distress.  Abdominal: Soft. She exhibits no distension. There is no tenderness. There is no rebound and no guarding.  Musculoskeletal: Normal range of motion. She exhibits edema.  Neurological: She is alert and oriented to person, place, and time.  Skin: Skin is warm and dry.    ED Course  Procedures (including critical care time)  Labs Reviewed  CBC - Abnormal; Notable for the following:    Platelets 407 (*)    All other components within normal limits  COMPREHENSIVE METABOLIC PANEL - Abnormal; Notable for the following:    Sodium 134 (*)    Potassium 2.7 (*)    Chloride 78 (*)    CO2 40 (*)    BUN 33 (*)    Creatinine, Ser 1.91 (*)    Total Bilirubin 0.2 (*)    GFR calc non Af Amer 33 (*)    GFR calc Af Amer 38 (*)    All other components within normal limits  DIFFERENTIAL  LACTIC ACID, PLASMA  LIPASE, BLOOD   Dg Abd Acute W/chest  10/16/2011  *RADIOLOGY REPORT*  Clinical Data:  Vomiting, left upper quadrant pain  ACUTE ABDOMEN SERIES (ABDOMEN 2 VIEW & CHEST 1 VIEW)  Comparison: CT abdomen pelvis of 09/11/2011  Findings: The lungs are clear and slightly hyperaerated. Mediastinal contours appear normal.  The heart is within normal limits in size. No bony abnormality is seen.  Supine and erect views of the abdomen show no evidence of bowel obstruction.  No free air is seen on the erect view.  Surgical clips are present in the right upper quadrant from cholecystectomy. No opaque calculi are noted.  IMPRESSION:  1.  No active lung disease.  Slight hyperaeration. 2.  No bowel obstruction.  No free air.  Original Report Authenticated  By: Juline Patch, M.D.     1. Vomiting      Date: 10/16/2011  Rate: 137  Rhythm: sinus tachycardia  QRS Axis: normal  Intervals: normal  ST/T Wave abnormalities: normal  Conduction Disutrbances:none  Narrative Interpretation:   Old EKG Reviewed: none available     MDM  Pt presented due to vomiting, for about 1 week.  Has had similar episodes multiple times since Aug when she had GB removed.  Has f/u with her surgeon and GI.  Started on PPI and symptoms improved but now have returned.  She reports 80 lb wt loss since Dec 2010.  Labs returned showing ARF, electrolyte abnormalties.  Consulted Internal medicine teaching service.  IVF given.  Kcl ordered.          Nena Alexander, MD Resident 10/16/11 [phone]

## 2011-10-16 NOTE — ED Provider Notes (Signed)
I saw and evaluated the patient, reviewed the resident's note and I agree with the findings and plan.   Nelia Shi, MD 10/17/11 [phone]

## 2011-10-16 NOTE — ED Notes (Signed)
Dr. Mills at the bedside.

## 2011-10-16 NOTE — ED Notes (Signed)
Patient had gallbladder surgery 08/18.  She reports she has had no appetite, and unable to eat or drink for 1 week.  Patient has n/v with any po intake.  Patient states she is also feeling dizzy.  Patient surgery was performed by Dr Dwain Sarna

## 2011-10-16 NOTE — H&P (Signed)
Hospital Admission Note Date: 10/16/2011  Patient name: [name] Barnett Medical record number: [id number] Date of birth: 11-Jul-1976 Age: 35 y.o. Gender: female PCP: None; patient is unassigned  Medical Service: Internal medicine teaching service  Attending physician:  Dr. Meredith Pel   1st Contact: Dr. Job Founds    Pager: [phone] 2nd Contact: Dr. Tonny Branch     Pager: [phone] After 5 pm or weekends: 1st Contact:   Pager: [phone] 2nd Contact:   Pager: [phone]  Chief Complaint: Intractable nausea and vomiting  History of Present Illness: Patient is a 35 her old female with past medical history significant for acute cholecystitis status post laparoscopic cholecystectomy on 07/28/2011. She presents today to [facility] emergency room with severe nausea and vomiting. She notes she has experienced 3 similar episodes following her recent laparoscopic cholecystectomy. She states she is unable to tolerate foods and fluids including small sips of water. She has followed up as an outpatient for these symptoms with Dr. Dwain Sarna of [facility] and underwent CT of the abdomen and pelvis earlier in October that was unrevealing for cause of her persistent nausea and vomiting. At that time, she was referred to Dr. Bosie Clos with Gastroenterology. He started her on Dexilant 60 mg daily with plan for EGD in late November. Patient notes her vomiting resolved after starting the Dexilant however she continued to experience significant nausea. She ran out of Dexilant about one week PTA and subsequently began to experience worsening nausea and vomiting approximately 5 days prior to her arrival today. She reports a sensation of "burning" in her epigastric area. She denies any other abdominal pain. She describes her emesis is mostly fluid and denies any hematemesis. She reports 3 days of watery diarrhea that occurred on the weekend prior to admission but is now resolved.  He denies any further diarrhea, loose  stools, bright red blood per rectum, or dark tarry stools. She denies fevers, chills, sick contacts, recent travel, exposure to fresh water sources, animal exposure, or ingestion of raw/undercooked foods or unpasteurized dairy products.  Meds: Dexilant: Agent has not been taking this medication for the last Lisinopril 20 mg daily: Patient has been unable to tolerate this medication for the past week   Allergies: NKDA  Past Medical History  Diagnosis Date  . Hypertension    Past Surgical History  Procedure Date  . Cholecystectomy 07/28/11    Social hx:  She [place] with her husband and 2 children ages 45 and 60. She works as a Psychologist, prison and probation services at [employer]. She is a current smoker she smokes approximately 1/2 to one full pack a day. She denies alcohol, or illicit drug use .  Family hx:  Father: Significant medical hisotry Mother: COPD, hypertension, "heart problems"  Review of Systems: Pertinent items are noted in HPI.  Physical Exam: Blood pressure 101/64, pulse 118, temperature 98.9 F (37.2 C), temperature source Oral, resp. rate 18, SpO2 98.00%. VItal signs reviewed and stable. GEN: No acutedistress.  Alert and oriented x 3.  Pt occasionally tearful during exam. HEENT: head is autraumatic and normocephalic.  Neck is supple without palpable masses or lymphadenopathy.  No JVD or carotid bruits.  Vision intact.  EOMI.  PERRLA.  Sclerae anicteric.  Conjunctivae without pallor or injection. Mucous membranes are slightly dry.  Oropharynx is without erythema, exudates, or other abnormal lesions.  Dentition is poor with numerous teeth missing. RESP:  Lungs are clear to ascultation bilaterally with good air movement.  No wheezes, ronchi, or rubs. CARDIOVASCULAR:  regular rate, normal rhythm.  Clear S1, S2, no murmurs, gallops, or rubs. ABDOMEN: soft, non-tender, non-distended.  Bowels sounds present in all quadrants and normoactive.  No palpable masses. EXT: warm and dry.   Peripheral pulses equal, intact, and +2 globally.  No clubbing or cyanosis.  Trace edema in bilateral lower extremities. SKIN: warm and dry with normal turgor.  No rashes or abnormal lesions observed. NEURO: CN II-XII grossly intact.  Muscle strength +5/5 in bilateral upper and lower extremities.  Sensation is grossly intact.  No focal deficit.   Lab results: Basic Metabolic Panel:  [facility] 10/16/11 0922  NA 134*  K 2.7*  CL 78*  CO2 40*  GLUCOSE 90  BUN 33*  CREATININE 1.91*  CALCIUM 10.5  MG --  PHOS --   Liver Function Tests:  [facility] 10/16/11 0922  AST 20  ALT 18  ALKPHOS 111  BILITOT 0.2*  PROT 7.4  ALBUMIN 3.8    Basename 10/16/11 0922  LIPASE 38  AMYLASE --    CBC:  Basename 10/16/11 0922  WBC 9.9  NEUTROABS 5.1  HGB 14.4  HCT 41.2  MCV 90.5  PLT 407*    Imaging results:  Dg Abd Acute W/chest  10/16/2011  *RADIOLOGY REPORT*  Clinical Data: Vomiting, left upper quadrant pain  ACUTE ABDOMEN SERIES (ABDOMEN 2 VIEW & CHEST 1 VIEW)  Comparison: CT abdomen pelvis of 09/11/2011  Findings: The lungs are clear and slightly hyperaerated. Mediastinal contours appear normal.  The heart is within normal limits in size. No bony abnormality is seen.  Supine and erect views of the abdomen show no evidence of bowel obstruction.  No free air is seen on the erect view.  Surgical clips are present in the right upper quadrant from cholecystectomy. No opaque calculi are noted.  IMPRESSION:  1.  No active lung disease.  Slight hyperaeration. 2.  No bowel obstruction.  No free air.  Original Report Authenticated By: Juline Patch, M.D.     Assessment & Plan by Problem:  #Intractable nausea and vomiting: Etiology of her profound nausea and vomiting is unclear at this point.  It is possible this is an unfortunate sequelae following her cholecystectomy however her symptoms do not seem triggered by consumption of high fat containing foods.  Poorly controlled GERD may explain her  symptoms and this is supported by her history.  Acute gastroenteritis seems unlikely given lack of exposure, lack of fever/leukocytosis, and hx to support the diagnosis.  Normal lipase excludes pancreatitis.  Her recent CT scan did not reveal evidence of a retained stone or other abnormality to explain her symptoms.  Acute abdominal series does not reveal SBO/ileus. - NPO - Phenergan for symptom relief - Protonix for GERD - IVF - Consider discussing case with Dr. Bosie Clos; it may be beneficial to proceed with EGD now to help establish a diagnosis.  #Hypokalemia: It is highly probable this is the result of her emesis.  Will replete and continue to monitor.  Will check magnesium level as well and replete if indicated.  #ARF: Patient's acute renal insufficiency is likely of pre-renal origin and the result of her profound nausea and vomiting.  Will hydrate with IVF and repeat BMET in the morning; urine analysis is currently pending.  If her Cr has not returned to baseline at that time, will obtain urine Na, urine Cr, and consider renal U/S for further workup.  #Tobacco use: Patient declines nicotine patch at this time.  Will obtain smoking cessation consult  #  DVT ppx: lovenox  Signed: Tanayah Squitieri 10/16/2011, 12:06 PM

## 2011-10-16 NOTE — Consult Note (Signed)
[facility] Consult Note  Referring Provider: No ref. provider found Primary Care Physician:  Provider Not In System Primary Gastroenterologist:  Dr.  Antony Contras Complaint: Nausea and vomiting HPI: [name] Barnett is an 35 y.o. white female mid with refractory nausea and vomiting. She had a cholecystectomy in August after having attacks of abdominal pain nausea and vomiting. She was found to have gallstones. She began having persistent nausea and vomiting out of proportion to pain after recovery and was seen in the emergency room twice and had a CT scan and possibly other scans that showed no obvious bile leak no complication of procedure and no elevated liver function tests. She was referred to Dr. Bosie Clos who started her on Dedexilant and she noted significant improvement. However when she ran out that she began having worsening nausea and vomiting and was admitted today. He had set her up for an endoscopy later this month.  Past Medical History  Diagnosis Date  . Hypertension     Past Surgical History  Procedure Date  . Cholecystectomy 07/28/11    Medications Prior to Admission  Medication Dose Route Frequency Provider Last Rate Last Dose  . 0.9 %  sodium chloride infusion   Intravenous Continuous Nelda Bucks      . enoxaparin (LOVENOX) injection 40 mg  40 mg Subcutaneous Q24H Kristin Mills   40 mg at 10/16/11 1646  . morphine 2 MG/ML injection 1 mg  1 mg Intravenous Q4H PRN Nelda Bucks      . ondansetron (ZOFRAN) injection 4 mg  4 mg Intravenous Once Nena Alexander, MD   4 mg at 10/16/11 0921  . pantoprazole (PROTONIX) injection 40 mg  40 mg Intravenous Once [employer]   40 mg at 10/16/11 1648  . potassium chloride 10 mEq in 100 mL IVPB  10 mEq Intravenous Q1 Hr x 2 Nena Alexander, MD   10 mEq at 10/16/11 1225  . potassium chloride 10 mEq in 100 mL IVPB  10 mEq Intravenous Q1 Hr x 5 Kristin Mills   10 mEq at 10/16/11 1652  . promethazine (PHENERGAN) tablet  12.5 mg  12.5 mg Oral Q6H PRN Nelda Bucks       Or  . promethazine (PHENERGAN) injection 12.5 mg  12.5 mg Intravenous Q6H PRN Nelda Bucks      . sodium chloride 0.9 % bolus 1,000 mL  1,000 mL Intravenous Once Nena Alexander, MD   1,000 mL at 10/16/11 0920  . sodium chloride 0.9 % bolus 1,000 mL  1,000 mL Intravenous Once Nena Alexander, MD   1,000 mL at 10/16/11 1038   Medications Prior to Admission  Medication Sig Dispense Refill  . LISINOPRIL PO Take 20 mg by mouth daily.         Allergies: No Known Allergies  History reviewed. No pertinent family history.  Social History:  reports that she has been smoking Cigarettes.  She has been smoking about 1 pack per day. She does not have any smokeless tobacco history on file. She reports that she does not drink alcohol or use illicit drugs.  Negative except as stated above   Blood pressure 103/69, pulse 80, temperature 98.1 F (36.7 C), temperature source Oral, resp. rate 20, height 5\' 3"  (1.6 m), weight 54.9 kg (121 lb 0.5 oz), SpO2 100.00%. Head: Normocephalic, without obvious abnormality, atraumatic Neck: no adenopathy, no carotid bruit, no JVD, supple, symmetrical, trachea midline and thyroid not enlarged, symmetric, no tenderness/mass/nodules Resp: clear to  auscultation bilaterally Cardio: regular rate and rhythm, S1, S2 normal, no murmur, click, rub or gallop GI: Abdomen soft nondistended with normoactive bowel sounds no hepatomegaly masses or Extremities: extremities normal, atraumatic, no cyanosis or edema  Results for orders placed during the hospital encounter of 10/16/11 (from the past 48 hour(s))  CBC     Status: Abnormal   Collection Time   10/16/11  9:22 AM      Component Value Range Comment   WBC 9.9  4.0 - 10.5 (K/uL)    RBC 4.55  3.87 - 5.11 (MIL/uL)    Hemoglobin 14.4  12.0 - 15.0 (g/dL)    HCT 21.3  08.6 - 57.8 (%)    MCV 90.5  78.0 - 100.0 (fL)    MCH 31.6  26.0 - 34.0 (pg)    MCHC 35.0  30.0 - 36.0  (g/dL)    RDW 46.9  62.9 - 52.8 (%)    Platelets 407 (*) 150 - 400 (K/uL)   DIFFERENTIAL     Status: Normal   Collection Time   10/16/11  9:22 AM      Component Value Range Comment   Neutrophils Relative 52  43 - 77 (%)    Neutro Abs 5.1  1.7 - 7.7 (K/uL)    Lymphocytes Relative 36  12 - 46 (%)    Lymphs Abs 3.6  0.7 - 4.0 (K/uL)    Monocytes Relative 10  3 - 12 (%)    Monocytes Absolute 1.0  0.1 - 1.0 (K/uL)    Eosinophils Relative 2  0 - 5 (%)    Eosinophils Absolute 0.2  0.0 - 0.7 (K/uL)    Basophils Relative 0  0 - 1 (%)    Basophils Absolute 0.0  0.0 - 0.1 (K/uL)   COMPREHENSIVE METABOLIC PANEL     Status: Abnormal   Collection Time   10/16/11  9:22 AM      Component Value Range Comment   Sodium 134 (*) 135 - 145 (mEq/L)    Potassium 2.7 (*) 3.5 - 5.1 (mEq/L)    Chloride 78 (*) 96 - 112 (mEq/L)    CO2 40 (*) 19 - 32 (mEq/L)    Glucose, Bld 90  70 - 99 (mg/dL)    BUN 33 (*) 6 - 23 (mg/dL)    Creatinine, Ser 4.13 (*) 0.50 - 1.10 (mg/dL)    Calcium 24.4  8.4 - 10.5 (mg/dL)    Total Protein 7.4  6.0 - 8.3 (g/dL)    Albumin 3.8  3.5 - 5.2 (g/dL)    AST 20  0 - 37 (U/L)    ALT 18  0 - 35 (U/L)    Alkaline Phosphatase 111  39 - 117 (U/L)    Total Bilirubin 0.2 (*) 0.3 - 1.2 (mg/dL)    GFR calc non Af Amer 33 (*) >90 (mL/min)    GFR calc Af Amer 38 (*) >90 (mL/min)   LACTIC ACID, PLASMA     Status: Normal   Collection Time   10/16/11  9:22 AM      Component Value Range Comment   Lactic Acid, Venous 1.4  0.5 - 2.2 (mmol/L)   LIPASE, BLOOD     Status: Normal   Collection Time   10/16/11  9:22 AM      Component Value Range Comment   Lipase 38  11 - 59 (U/L)   CBC     Status: Abnormal   Collection Time   10/16/11  2:16  PM      Component Value Range Comment   WBC 9.4  4.0 - 10.5 (K/uL)    RBC 3.40 (*) 3.87 - 5.11 (MIL/uL)    Hemoglobin 10.6 (*) 12.0 - 15.0 (g/dL)    HCT 40.9 (*) 81.1 - 46.0 (%)    MCV 92.4  78.0 - 100.0 (fL)    MCH 31.2  26.0 - 34.0 (pg)    MCHC 33.8  30.0  - 36.0 (g/dL)    RDW 91.4  78.2 - 95.6 (%)    Platelets 290  150 - 400 (K/uL)   CREATININE, SERUM     Status: Abnormal   Collection Time   10/16/11  2:16 PM      Component Value Range Comment   Creatinine, Ser 1.37 (*) 0.50 - 1.10 (mg/dL)    GFR calc non Af Amer 49 (*) >90 (mL/min)    GFR calc Af Amer 57 (*) >90 (mL/min)   MAGNESIUM     Status: Normal   Collection Time   10/16/11  2:16 PM      Component Value Range Comment   Magnesium 2.0  1.5 - 2.5 (mg/dL)   URINALYSIS, ROUTINE W REFLEX MICROSCOPIC     Status: Abnormal   Collection Time   10/16/11  2:37 PM      Component Value Range Comment   Color, Urine YELLOW  YELLOW     Appearance CLOUDY (*) CLEAR     Specific Gravity, Urine 1.022  1.005 - 1.030     pH 7.0  5.0 - 8.0     Glucose, UA NEGATIVE  NEGATIVE (mg/dL)    Hgb urine dipstick NEGATIVE  NEGATIVE     Bilirubin Urine SMALL (*) NEGATIVE     Ketones, ur 15 (*) NEGATIVE (mg/dL)    Protein, ur 30 (*) NEGATIVE (mg/dL)    Urobilinogen, UA 1.0  0.0 - 1.0 (mg/dL)    Nitrite NEGATIVE  NEGATIVE     Leukocytes, UA NEGATIVE  NEGATIVE    URINE RAPID DRUG SCREEN (HOSP PERFORMED)     Status: Normal   Collection Time   10/16/11  2:37 PM      Component Value Range Comment   Opiates NONE DETECTED  NONE DETECTED     Cocaine NONE DETECTED  NONE DETECTED     Benzodiazepines NONE DETECTED  NONE DETECTED     Amphetamines NONE DETECTED  NONE DETECTED     Tetrahydrocannabinol NONE DETECTED  NONE DETECTED     Barbiturates NONE DETECTED  NONE DETECTED    URINE MICROSCOPIC-ADD ON     Status: Abnormal   Collection Time   10/16/11  2:37 PM      Component Value Range Comment   Squamous Epithelial / LPF MANY (*) RARE     WBC, UA 0-2  <3 (WBC/hpf)    RBC / HPF 0-2  <3 (RBC/hpf)    Bacteria, UA FEW (*) RARE     Casts HYALINE CASTS (*) NEGATIVE     Dg Abd Acute W/chest  10/16/2011  *RADIOLOGY REPORT*  Clinical Data: Vomiting, left upper quadrant pain  ACUTE ABDOMEN SERIES (ABDOMEN 2 VIEW & CHEST 1  VIEW)  Comparison: CT abdomen pelvis of 09/11/2011  Findings: The lungs are clear and slightly hyperaerated. Mediastinal contours appear normal.  The heart is within normal limits in size. No bony abnormality is seen.  Supine and erect views of the abdomen show no evidence of bowel obstruction.  No free air is seen on the  erect view.  Surgical clips are present in the right upper quadrant from cholecystectomy. No opaque calculi are noted.  IMPRESSION:  1.  No active lung disease.  Slight hyperaeration. 2.  No bowel obstruction.  No free air.  Original Report Authenticated By: Juline Patch, M.D.    Assessment: Refractory nausea and vomiting after cholecystectomy with some features suggesting severe acid reflux or possible delayed gastric emptying Plan:  Will proceed directly to EGD. Agree with Protonix already started. Aloma Boch C 10/16/2011, 4:53 PM

## 2011-10-17 ENCOUNTER — Encounter (HOSPITAL_COMMUNITY): Payer: Self-pay | Admitting: Internal Medicine

## 2011-10-17 ENCOUNTER — Encounter (HOSPITAL_COMMUNITY)
Admission: EM | Disposition: A | Discharge status: Home / Self Care | Payer: Self-pay | Source: Home / Self Care | Attending: Internal Medicine

## 2011-10-17 DIAGNOSIS — K263 Acute duodenal ulcer without hemorrhage or perforation: Secondary | ICD-10-CM | POA: Diagnosis present

## 2011-10-17 DIAGNOSIS — K269 Duodenal ulcer, unspecified as acute or chronic, without hemorrhage or perforation: Secondary | ICD-10-CM

## 2011-10-17 HISTORY — PX: ESOPHAGOGASTRODUODENOSCOPY: SHX5428

## 2011-10-17 LAB — CBC
HCT: 32.9 % — ABNORMAL LOW (ref 36.0–46.0)
Hemoglobin: 10.8 g/dL — ABNORMAL LOW (ref 12.0–15.0)
MCH: 31 pg (ref 26.0–34.0)
MCH: 30.8 pg (ref 26.0–34.0)
MCHC: 32.8 g/dL (ref 30.0–36.0)
MCV: 94.4 fL (ref 78.0–100.0)
Platelets: 263 10*3/uL (ref 150–400)
RDW: 13.4 % (ref 11.5–15.5)

## 2011-10-17 LAB — BASIC METABOLIC PANEL
Calcium: 8.2 mg/dL — ABNORMAL LOW (ref 8.4–10.5)
Calcium: 8.6 mg/dL (ref 8.4–10.5)
Creatinine, Ser: 0.91 mg/dL (ref 0.50–1.10)
Creatinine, Ser: 0.83 mg/dL (ref 0.50–1.10)
GFR calc Af Amer: >90 mL/min (ref >90)
GFR calc Af Amer: >90 mL/min (ref >90)
GFR calc non Af Amer: 80 mL/min — ABNORMAL LOW (ref >90)
GFR calc non Af Amer: 90 mL/min — ABNORMAL LOW (ref >90)

## 2011-10-17 LAB — IRON AND TIBC: Saturation Ratios: 24 % (ref 20–55)

## 2011-10-17 LAB — GLUCOSE, CAPILLARY: Glucose-Capillary: 118 mg/dL — ABNORMAL HIGH (ref 70–99)

## 2011-10-17 LAB — RETICULOCYTES: RBC.: 3.51 MIL/uL — ABNORMAL LOW (ref 3.87–5.11)

## 2011-10-17 LAB — VITAMIN B12: Vitamin B-12: 1270 pg/mL — ABNORMAL HIGH (ref 211–911)

## 2011-10-17 SURGERY — EGD (ESOPHAGOGASTRODUODENOSCOPY)
Anesthesia: Moderate Sedation

## 2011-10-17 MED ORDER — MIDAZOLAM HCL 10 MG/2ML IJ SOLN
INTRAMUSCULAR | Status: DC | PRN
  Administered 2011-10-17 (×2): 2 mg via INTRAVENOUS
  Administered 2011-10-17 (×2): 1 mg via INTRAVENOUS

## 2011-10-17 MED ORDER — DIPHENHYDRAMINE HCL 50 MG/ML IJ SOLN
INTRAMUSCULAR | Status: AC
  Filled 2011-10-17: qty 1

## 2011-10-17 MED ORDER — DEXTROSE 50 % IV SOLN
INTRAVENOUS | Status: AC
  Administered 2011-10-17: 10:00:00
  Filled 2011-10-17: qty 50

## 2011-10-17 MED ORDER — PANTOPRAZOLE SODIUM 40 MG IV SOLR
40.0000 mg | Freq: Two times a day (BID) | INTRAVENOUS | Status: DC
  Administered 2011-10-17 – 2011-10-18 (×4): 40 mg via INTRAVENOUS
  Filled 2011-10-17 (×7): qty 40

## 2011-10-17 MED ORDER — BUTAMBEN-TETRACAINE-BENZOCAINE 2-2-14 % EX AERO
INHALATION_SPRAY | CUTANEOUS | Status: DC | PRN
  Administered 2011-10-17: 2 via TOPICAL

## 2011-10-17 MED ORDER — MIDAZOLAM HCL 10 MG/2ML IJ SOLN
INTRAMUSCULAR | Status: AC
  Filled 2011-10-17: qty 2

## 2011-10-17 MED ORDER — DIPHENHYDRAMINE HCL 50 MG/ML IJ SOLN
INTRAMUSCULAR | Status: DC | PRN
  Administered 2011-10-17: 25 mg via INTRAVENOUS

## 2011-10-17 MED ORDER — FENTANYL CITRATE 0.05 MG/ML IJ SOLN
INTRAMUSCULAR | Status: AC
  Filled 2011-10-17: qty 2

## 2011-10-17 MED ORDER — FENTANYL CITRATE 0.05 MG/ML IJ SOLN
INTRAMUSCULAR | Status: DC | PRN
  Administered 2011-10-17 (×3): 25 ug via INTRAVENOUS

## 2011-10-17 NOTE — Progress Notes (Signed)
Subjective: Pt hungry, upset that she has to remain NPO.  No N/V today.  Toelrated EGD well. Please see GI and Surgery notes for updates on today's procedures/events.  Objective: Vital signs in last 24 hours: Filed Vitals:   10/17/11 1219 10/17/11 1229 10/17/11 1239 10/17/11 1500  BP: 115/68 120/74 131/77 100/72  Pulse: 85 75 74 72  Temp:    98 F (36.7 C)  TempSrc:      Resp: 18 15 12 16   Height:      Weight:      SpO2: 95% 96% 96% 96%   Weight change:   Intake/Output Summary (Last 24 hours) at 10/17/11 1627 Last data filed at 10/17/11 1500  Gross per 24 hour  Intake 3177.5 ml  Output   1750 ml  Net 1427.5 ml   Physical Exam: Physical Exam GEN: NAD.  Alert and oriented x 3.  Pleasant, conversant, and cooperative to exam. RESP:  CTAB, no w/r/r CARDIOVASCULAR: RRR, S1, S2, no m/r/g ABDOMEN: soft, NT/ND, NABS EXT: warm and dry. No edema in b/l LE Lab Results: Basic Metabolic Panel:  Lab 10/17/11 1610 10/17/11 0720 10/16/11 1416  NA 140 138 --  K 3.8 3.4* --  CL 105 102 --  CO2 24 21 --  GLUCOSE 61* 35* --  BUN 15 18 --  CREATININE 0.83 0.91 --  CALCIUM 8.6 8.2* --  MG -- -- 2.0  PHOS -- -- --   Liver Function Tests:  Lab 10/16/11 0922  AST 20  ALT 18  ALKPHOS 111  BILITOT 0.2*  PROT 7.4  ALBUMIN 3.8    Lab 10/16/11 0922  LIPASE 38  AMYLASE --   No results found for this basename: AMMONIA:2 in the last 168 hours CBC:  Lab 10/17/11 1407 10/17/11 0720 10/16/11 0922  WBC 7.8 6.8 --  NEUTROABS -- -- 5.1  HGB 10.8* 9.4* --  HCT 32.9* 28.6* --  MCV 93.7 94.4 --  PLT 306 263 --   CBG:  Lab 10/17/11 1021  GLUCAP 118*   Anemia Panel:  Lab 10/17/11 1407  VITAMINB12 --  FOLATE --  FERRITIN --  TIBC --  IRON --  RETICCTPCT 0.7     Micro Results: No results found for this or any previous visit (from the past 240 hour(s)). Studies/Results: Dg Abd Acute W/chest  10/16/2011  *RADIOLOGY REPORT*  Clinical Data: Vomiting, left upper quadrant  pain  ACUTE ABDOMEN SERIES (ABDOMEN 2 VIEW & CHEST 1 VIEW)  Comparison: CT abdomen pelvis of 09/11/2011  Findings: The lungs are clear and slightly hyperaerated. Mediastinal contours appear normal.  The heart is within normal limits in size. No bony abnormality is seen.  Supine and erect views of the abdomen show no evidence of bowel obstruction.  No free air is seen on the erect view.  Surgical clips are present in the right upper quadrant from cholecystectomy. No opaque calculi are noted.  IMPRESSION:  1.  No active lung disease.  Slight hyperaeration. 2.  No bowel obstruction.  No free air.  Original Report Authenticated By: Juline Patch, M.D.   Medications: I have reviewed the patient's current medications. Scheduled Meds:   . dextrose      . enoxaparin  40 mg Subcutaneous Q24H  . pantoprazole (PROTONIX) IV  40 mg Intravenous Once  . pantoprazole (PROTONIX) IV  40 mg Intravenous Q12H  . potassium chloride  10 mEq Intravenous Q1 Hr x 5   Continuous Infusions:   . sodium chloride 150  mL/hr at 10/17/11 0600   PRN Meds:.morphine, promethazine, promethazine, DISCONTD: butamben-tetracaine-benzocaine, DISCONTD: diphenhydrAMINE, DISCONTD: fentaNYL, DISCONTD: midazolam  Assessment/Plan: # Nausea and vomiting EGD today revealed duodenal ulcer with obstruction.  General surgery has consulted and d/w GI.  Rec's for NPO + PPI, awaiting further recs.  Pt extremely hungry and frustrated, would like to eat.  Surgery not a good option per [facility] team.  Will con't to develop good plan for patient. - appreciate surgical rec's - appreciate GI recs - PPI - ugi swallow study tomorrow a.m. - clear liquid diet for now if tolerable, then npo after midnight (d/w PA Earl Gala)  # Acid/Base Disturbance Pt p/w alkalosis that transitioned to anion gap acidosis.  Gap has now closed with fluids.  No clear etiology at this time. - con't to trend  # Anemia Pt p/w relatively acute anemia, likely a reflection of fluid  status, but also could be related to ulcer.  No s/s of bleeding, but will watch closely.  Anemia panel pending. - CBC trend - anemia panel - monitor vitals  #ARF Creatinine trended down to wnl  #Hypokalemia Corrected with repletion and fluids  #PPx: lovenox    LOS: 1 day   WILDMAN-TOBRINER, BEN 10/17/2011, 4:27 PM

## 2011-10-17 NOTE — Brief Op Note (Signed)
10/16/2011 - 10/17/2011  12:16 PM  PATIENT:  [name] Barnett  35 y.o. female  PRE-OPERATIVE DIAGNOSIS:  nausea and vomiting  POST-OPERATIVE DIAGNOSIS:  duodenal ulcer  PROCEDURE:  Procedure(s): ESOPHAGOGASTRODUODENOSCOPY (EGD)  SURGEON:  Surgeon(s): Barrie Folk, MD  PHYSICIAN ASSISTANT:

## 2011-10-17 NOTE — Progress Notes (Signed)
[facility] Progress Note  Subjective: The patient feels better today and has had no further nausea or vomiting  Objective: Vital signs in last 24 hours: Temp:  [97.1 F (36.2 C)-98.5 F (36.9 C)] 97.1 F (36.2 C) (11/07 1107) Pulse Rate:  [65-92] 74  (11/07 1239) Resp:  [12-20] 12  (11/07 1239) BP: (102-141)/(66-112) 131/77 mmHg (11/07 1239) SpO2:  [95 %-99 %] 96 % (11/07 1239) Weight:  [56.9 kg (125 lb 7.1 oz)] 125 lb 7.1 oz (56.9 kg) (11/07 0500) Weight change:    PE: Abdomen soft, nondistended with normoactive bowel sounds. Type SII  Lab Results: Results for orders placed during the hospital encounter of 10/16/11 (from the past 24 hour(s))  CBC     Status: Abnormal   Collection Time   10/16/11  2:16 PM      Component Value Range   WBC 9.4  4.0 - 10.5 (K/uL)   RBC 3.40 (*) 3.87 - 5.11 (MIL/uL)   Hemoglobin 10.6 (*) 12.0 - 15.0 (g/dL)   HCT 78.2 (*) 95.6 - 46.0 (%)   MCV 92.4  78.0 - 100.0 (fL)   MCH 31.2  26.0 - 34.0 (pg)   MCHC 33.8  30.0 - 36.0 (g/dL)   RDW 21.3  08.6 - 57.8 (%)   Platelets 290  150 - 400 (K/uL)  CREATININE, SERUM     Status: Abnormal   Collection Time   10/16/11  2:16 PM      Component Value Range   Creatinine, Ser 1.37 (*) 0.50 - 1.10 (mg/dL)   GFR calc non Af Amer 49 (*) >90 (mL/min)   GFR calc Af Amer 57 (*) >90 (mL/min)  MAGNESIUM     Status: Normal   Collection Time   10/16/11  2:16 PM      Component Value Range   Magnesium 2.0  1.5 - 2.5 (mg/dL)  HIV ANTIBODY     Status: Normal   Collection Time   10/16/11  2:16 PM      Component Value Range   HIV NON REACTIVE  NON REACTIVE   URINALYSIS, ROUTINE W REFLEX MICROSCOPIC     Status: Abnormal   Collection Time   10/16/11  2:37 PM      Component Value Range   Color, Urine YELLOW  YELLOW    Appearance CLOUDY (*) CLEAR    Specific Gravity, Urine 1.022  1.005 - 1.030    pH 7.0  5.0 - 8.0    Glucose, UA NEGATIVE  NEGATIVE (mg/dL)   Hgb urine dipstick NEGATIVE  NEGATIVE    Bilirubin Urine SMALL (*) NEGATIVE    Ketones, ur 15 (*) NEGATIVE (mg/dL)   Protein, ur 30 (*) NEGATIVE (mg/dL)   Urobilinogen, UA 1.0  0.0 - 1.0 (mg/dL)   Nitrite NEGATIVE  NEGATIVE    Leukocytes, UA NEGATIVE  NEGATIVE   URINE RAPID DRUG SCREEN (HOSP PERFORMED)     Status: Normal   Collection Time   10/16/11  2:37 PM      Component Value Range   Opiates NONE DETECTED  NONE DETECTED    Cocaine NONE DETECTED  NONE DETECTED    Benzodiazepines NONE DETECTED  NONE DETECTED    Amphetamines NONE DETECTED  NONE DETECTED    Tetrahydrocannabinol NONE DETECTED  NONE DETECTED    Barbiturates NONE DETECTED  NONE DETECTED   URINE MICROSCOPIC-ADD ON     Status: Abnormal   Collection Time   10/16/11  2:37 PM      Component Value  Range   Squamous Epithelial / LPF MANY (*) RARE    WBC, UA 0-2  <3 (WBC/hpf)   RBC / HPF 0-2  <3 (RBC/hpf)   Bacteria, UA FEW (*) RARE    Casts HYALINE CASTS (*) NEGATIVE   BASIC METABOLIC PANEL     Status: Abnormal   Collection Time   10/16/11  8:07 PM      Component Value Range   Sodium 134 (*) 135 - 145 (mEq/L)   Potassium 3.0 (*) 3.5 - 5.1 (mEq/L)   Chloride 96  96 - 112 (mEq/L)   CO2 28  19 - 32 (mEq/L)   Glucose, Bld 60 (*) 70 - 99 (mg/dL)   BUN 23  6 - 23 (mg/dL)   Creatinine, Ser 2.13 (*) 0.50 - 1.10 (mg/dL)   Calcium 7.7 (*) 8.4 - 10.5 (mg/dL)   GFR calc non Af Amer 62 (*) >90 (mL/min)   GFR calc Af Amer 72 (*) >90 (mL/min)  CBC     Status: Abnormal   Collection Time   10/17/11  7:20 AM      Component Value Range   WBC 6.8  4.0 - 10.5 (K/uL)   RBC 3.03 (*) 3.87 - 5.11 (MIL/uL)   Hemoglobin 9.4 (*) 12.0 - 15.0 (g/dL)   HCT 08.6 (*) 57.8 - 46.0 (%)   MCV 94.4  78.0 - 100.0 (fL)   MCH 31.0  26.0 - 34.0 (pg)   MCHC 32.9  30.0 - 36.0 (g/dL)   RDW 46.9  62.9 - 52.8 (%)   Platelets 263  150 - 400 (K/uL)  BASIC METABOLIC PANEL     Status: Abnormal   Collection Time   10/17/11  7:20 AM      Component Value Range   Sodium 138  135 - 145 (mEq/L)   Potassium  3.4 (*) 3.5 - 5.1 (mEq/L)   Chloride 102  96 - 112 (mEq/L)   CO2 21  19 - 32 (mEq/L)   Glucose, Bld 35 (*) 70 - 99 (mg/dL)   BUN 18  6 - 23 (mg/dL)   Creatinine, Ser 4.13  0.50 - 1.10 (mg/dL)   Calcium 8.2 (*) 8.4 - 10.5 (mg/dL)   GFR calc non Af Amer 80 (*) >90 (mL/min)   GFR calc Af Amer >90  >90 (mL/min)  GLUCOSE, CAPILLARY     Status: Abnormal   Collection Time   10/17/11 10:21 AM      Component Value Range   Glucose-Capillary 118 (*) 70 - 99 (mg/dL)    Studies/Results: @[id number] @    Assessment: Duodenal ulcer with high grade obstruction  Plan: Continue proton pump inhibitor. We'll obtain surgical consult as I suspect this obstructive lesion will not be satisfactorily amenable to medical/endoscopic remedy.    Mairim Bade C 10/17/2011, 1:52 PM

## 2011-10-17 NOTE — H&P (Signed)
Internal Medicine Attending Admission Note Date: 10/17/2011  Patient name: [name] Barnett Medical record number: [id number] Date of birth: Oct 31, 1976 Age: 35 y.o. Gender: female  I saw and evaluated the patient. I reviewed the resident's note and I agree with the resident's findings and plan as documented in the resident's note, with additional comments as noted below..  Chief Complaint(s): Nausea and vomiting  History - key components related to admission: Patient is a 35 year old female with a history of acute cholecystitis, status post laparoscopic cholecystectomy in August of 2012, admitted with nausea and vomiting; she has had other such episodes since her cholecystectomy.  She was started on Dexilant by Dr. Bosie Clos and reports some improvement in her symptoms on that medication; she ran out of Dexilant about one week prior to admission.  She denies abdominal pain.  Physical Exam - key components related to admission:  Filed Vitals:   10/17/11 1211 10/17/11 1219 10/17/11 1229 10/17/11 1239  BP: 122/72 115/68 120/74 131/77  Pulse: 84 85 75 74  Temp:      TempSrc:      Resp: 18 18 15 12   Height:      Weight:      SpO2: 95% 95% 96% 96%   General appearance: alert and no distress Lungs: clear to auscultation bilaterally Heart: regular rate and rhythm, S1, S2 normal, no murmur, click, rub or gallop Abdomen: soft, nontender, bowel sounds present; no hepatosplenomegaly Extremities: no edema   Lab results:  Basic Metabolic Panel:  Basename 10/17/11 1407 10/17/11 0720 10/16/11 2007 10/16/11 1416 10/16/11 0922  NA 140 138 134* -- 134*  K 3.8 3.4* 3.0* -- 2.7*  CL 105 102 96 -- 78*  CO2 24 21 28  -- 40*  BUN 15 18 23  -- 33*  CREATININE 0.83 0.91 1.12* 1.37* 1.91*  GLUCOSE 61* 35* 60* -- 90  CALCIUM 8.6 8.2* 7.7* -- 10.5  MG -- -- -- 2.0 --  PHOS -- -- -- -- --      Liver Function Tests:  [facility] 10/16/11 0922  AST 20  ALT 18  ALKPHOS 111  BILITOT 0.2*  PROT 7.4    ALBUMIN 3.8    Basename 10/16/11 0922  LIPASE 38  AMYLASE --   No results found for this basename: AMMONIA:2 in the last 72 hours CBC:  Basename 10/17/11 1407 10/17/11 0720 10/16/11 0922  WBC 7.8 6.8 --  NEUTROABS -- -- 5.1  HGB 10.8* 9.4* --  HCT 32.9* 28.6* --  MCV 93.7 94.4 --  PLT 306 263 --   CBG:  Basename 10/17/11 1021  GLUCAP 118*   Anemia Panel:  Basename 10/17/11 1407  VITAMINB12 --  FOLATE --  FERRITIN --  TIBC --  IRON --  RETICCTPCT 0.7    Imaging results:   10/16/2011 ACUTE ABDOMEN SERIES (ABDOMEN 2 VIEW & CHEST 1 VIEW)  Comparison: CT abdomen pelvis of 09/11/2011  Findings: The lungs are clear and slightly hyperaerated. Mediastinal contours appear normal.  The heart is within normal limits in size. No bony abnormality is seen.  Supine and erect views of the abdomen show no evidence of bowel obstruction.  No free air is seen on the erect view.  Surgical clips are present in the right upper quadrant from cholecystectomy. No opaque calculi are noted.  IMPRESSION:  1.  No active lung disease.  Slight hyperaeration. 2.  No bowel obstruction.  No free air.     Assessment & Plan by Problem: #1. Nausea and vomiting due  to duodenal ulcer with high grade obstruction.  EGD today by Dr. Madilyn Fireman showed a duodenal ulcer with high grade obstruction.  He advised surgery consultation, and their initial impression is that medical therapy is the best option rather than proceeding to surgery at this time.  They are considering the utility of an upper GI study to evaluate the degree of obstruction.  If patient is unable to take oral nutrition, then parenteral nutrition may be needed.  Plan is IV PPI therapy; await further recommendations from the surgical team.  Would discuss with Dr. Madilyn Fireman whether biopsy was done to look for H. pylori, and also whether empiric antibiotic treatment would be warranted given the obstruction. #2 Volume depletion due to #1. Plan is IV saline volume  replacement. #3. Hypokalemia due to #1.  Plan is to replace and follow potassium. #4. Acute renal insufficiency.  This was likely prerenal; creatinine has normalized with volume replacement.  Plan is to follow BUN and creatinine. #5. Acid-base abnormality.  Patient presented with an elevated bicarbonate consistent with a contraction alkalosis; her bicarbonate and anion gap have normalized.

## 2011-10-17 NOTE — Consult Note (Signed)
[employer] 06-Dec-1976  [id number].   Requesting MD: Dr. Dorena Cookey Chief Complaint/Reason for Consult: Nausea, vomiting with obstructing duodenal ulcer  HPI: This is a 35 year old white female with a history of hypertension. She underwent a laparoscopic cholecystectomy for acute cholecystitis approximately one month ago by Dr. Emelia Loron. She is having abdominal pain for approximately several months before then. After she had her gallbladder removed her pain dissipated. Despite her pain dissipating, she continued to have nausea and vomiting. She was sent to see Dr. Bosie Clos the GI doctor's office. He placed her on Dexilant, which seemed to help her nausea and vomiting. However, she ran out of this after 6 days and her symptoms persisted. She states she has essentially been unable to eat or drink anything for the last several weeks but has been significantly worse since last Tuesday.  She presented to the emergency department yesterday due to continued complaints of persistent nausea and vomiting. She was then admitted. Gastroenterology was consulted. An upper endoscopy was completed today revealing a duodenal ulcer at the junction of the duodenal bulb and the second portion of the duodenum. Secondary to the ulcer and edema, this had caused an obstruction. The endoscopy scope could not be advanced past this obstruction. Because of this finding, we have been asked to evaluate the patient for further recommendations.   Review of systems: Please see history of present illness otherwise all other systems have been reviewed and are negative. The patient does admit to decrease in urine output secondary to dehydration.  History reviewed. No pertinent family history.  Past Medical History  Diagnosis Date  . Hypertension   . GERD (gastroesophageal reflux disease)     Past Surgical History  Procedure Date  . Cholecystectomy 07/28/11  . Temporomandibular joint surgery     2 surgeries     Social History:  reports that she has been smoking Cigarettes.  She has been smoking about 1 pack per day. She does not have any smokeless tobacco history on file. She reports that she does not drink alcohol or use illicit drugs.  Allergies: No Known Allergies  Medications Prior to Admission  Medication Dose Route Frequency Provider Last Rate Last Dose  . 0.9 %  sodium chloride infusion   Intravenous Continuous Kristin Mills 150 mL/hr at 10/17/11 0600    . dextrose 50 % solution           . enoxaparin (LOVENOX) injection 40 mg  40 mg Subcutaneous Q24H Kristin Mills   40 mg at 10/16/11 1646  . morphine 2 MG/ML injection 1 mg  1 mg Intravenous Q4H PRN Nelda Bucks      . ondansetron (ZOFRAN) injection 4 mg  4 mg Intravenous Once Nena Alexander, MD   4 mg at 10/16/11 0921  . pantoprazole (PROTONIX) injection 40 mg  40 mg Intravenous Once [employer]   40 mg at 10/16/11 1648  . pantoprazole (PROTONIX) injection 40 mg  40 mg Intravenous Q12H Letha Cape, PA      . potassium chloride 10 mEq in 100 mL IVPB  10 mEq Intravenous Q1 Hr x 2 Nena Alexander, MD   10 mEq at 10/16/11 1225  . potassium chloride 10 mEq in 100 mL IVPB  10 mEq Intravenous Q1 Hr x 5 Kristin Mills   10 mEq at 10/17/11 0130  . promethazine (PHENERGAN) tablet 12.5 mg  12.5 mg Oral Q6H PRN Nelda Bucks   12.5 mg at 10/16/11 2332   Or  .  promethazine (PHENERGAN) injection 12.5 mg  12.5 mg Intravenous Q6H PRN Nelda Bucks      . sodium chloride 0.9 % bolus 1,000 mL  1,000 mL Intravenous Once Nena Alexander, MD   1,000 mL at 10/16/11 0920  . sodium chloride 0.9 % bolus 1,000 mL  1,000 mL Intravenous Once Nena Alexander, MD   1,000 mL at 10/16/11 1038  . DISCONTD: butamben-tetracaine-benzocaine (CETACAINE) spray    PRN Barrie Folk, MD   2 spray at 10/17/11 1142  . DISCONTD: diphenhydrAMINE (BENADRYL) injection    PRN Barrie Folk, MD   25 mg at 10/17/11 1145  . DISCONTD: fentaNYL (SUBLIMAZE) injection    PRN  Barrie Folk, MD   25 mcg at 10/17/11 1152  . DISCONTD: midazolam (VERSED) 10 MG/2ML injection    PRN Barrie Folk, MD   1 mg at 10/17/11 1154   Medications Prior to Admission  Medication Sig Dispense Refill  . LISINOPRIL PO Take 20 mg by mouth daily.         Blood pressure 131/77, pulse 74, temperature 97.1 F (36.2 C), temperature source Oral, resp. rate 12, height 5\' 3"  (1.6 m), weight 125 lb 7.1 oz (56.9 kg), last menstrual period 09/23/2011, SpO2 96.00%. Physical exam: Gen.: This is a 35 year old well-developed well-nourished white female who is currently lying in bed in no acute distress. HEENT: Head is normocephalic atraumatic. Sclerae are not injected. Pupils are equal round and reactive. Ears and nose with obvious masses or lesions. Mouth is pink. Heart: Regular, rate, rhythm. normal S1-S2 with no murmurs, gallops, rubs. She has palpable pedal pulses bilaterally. Lungs: Clear to auscultation bilaterally. With no wheezes, rhonchi, rails. Respiratory effort is non-labored. Abdomen: Soft, nontender, nondistended, active bowel sounds. No obvious masses hernias or organomegaly are noted. She does have scars present from her prior cholecystectomy. Musculoskeletal: All 4 extremities are symmetrical with no cyanosis, clubbing, edema. Skin: Warm and dry with no obvious masses, lesions, rashes. Psychiatric: The patient is alert and oriented x3 with an appropriate affect. She did seem somewhat agitated; however, I think this is secondary to her current situation.   Results for orders placed during the hospital encounter of 10/16/11 (from the past 48 hour(s))  CBC     Status: Abnormal   Collection Time   10/16/11  9:22 AM      Component Value Range Comment   WBC 9.9  4.0 - 10.5 (K/uL)    RBC 4.55  3.87 - 5.11 (MIL/uL)    Hemoglobin 14.4  12.0 - 15.0 (g/dL)    HCT 16.1  09.6 - 04.5 (%)    MCV 90.5  78.0 - 100.0 (fL)    MCH 31.6  26.0 - 34.0 (pg)    MCHC 35.0  30.0 - 36.0 (g/dL)    RDW  40.9  81.1 - 91.4 (%)    Platelets 407 (*) 150 - 400 (K/uL)   DIFFERENTIAL     Status: Normal   Collection Time   10/16/11  9:22 AM      Component Value Range Comment   Neutrophils Relative 52  43 - 77 (%)    Neutro Abs 5.1  1.7 - 7.7 (K/uL)    Lymphocytes Relative 36  12 - 46 (%)    Lymphs Abs 3.6  0.7 - 4.0 (K/uL)    Monocytes Relative 10  3 - 12 (%)    Monocytes Absolute 1.0  0.1 - 1.0 (K/uL)    Eosinophils Relative 2  0 - 5 (%)    Eosinophils Absolute 0.2  0.0 - 0.7 (K/uL)    Basophils Relative 0  0 - 1 (%)    Basophils Absolute 0.0  0.0 - 0.1 (K/uL)   COMPREHENSIVE METABOLIC PANEL     Status: Abnormal   Collection Time   10/16/11  9:22 AM      Component Value Range Comment   Sodium 134 (*) 135 - 145 (mEq/L)    Potassium 2.7 (*) 3.5 - 5.1 (mEq/L)    Chloride 78 (*) 96 - 112 (mEq/L)    CO2 40 (*) 19 - 32 (mEq/L)    Glucose, Bld 90  70 - 99 (mg/dL)    BUN 33 (*) 6 - 23 (mg/dL)    Creatinine, Ser 2.13 (*) 0.50 - 1.10 (mg/dL)    Calcium 08.6  8.4 - 10.5 (mg/dL)    Total Protein 7.4  6.0 - 8.3 (g/dL)    Albumin 3.8  3.5 - 5.2 (g/dL)    AST 20  0 - 37 (U/L)    ALT 18  0 - 35 (U/L)    Alkaline Phosphatase 111  39 - 117 (U/L)    Total Bilirubin 0.2 (*) 0.3 - 1.2 (mg/dL)    GFR calc non Af Amer 33 (*) >90 (mL/min)    GFR calc Af Amer 38 (*) >90 (mL/min)   LACTIC ACID, PLASMA     Status: Normal   Collection Time   10/16/11  9:22 AM      Component Value Range Comment   Lactic Acid, Venous 1.4  0.5 - 2.2 (mmol/L)   LIPASE, BLOOD     Status: Normal   Collection Time   10/16/11  9:22 AM      Component Value Range Comment   Lipase 38  11 - 59 (U/L)   CBC     Status: Abnormal   Collection Time   10/16/11  2:16 PM      Component Value Range Comment   WBC 9.4  4.0 - 10.5 (K/uL)    RBC 3.40 (*) 3.87 - 5.11 (MIL/uL)    Hemoglobin 10.6 (*) 12.0 - 15.0 (g/dL)    HCT 57.8 (*) 46.9 - 46.0 (%)    MCV 92.4  78.0 - 100.0 (fL)    MCH 31.2  26.0 - 34.0 (pg)    MCHC 33.8  30.0 - 36.0 (g/dL)     RDW 62.9  52.8 - 41.3 (%)    Platelets 290  150 - 400 (K/uL)   CREATININE, SERUM     Status: Abnormal   Collection Time   10/16/11  2:16 PM      Component Value Range Comment   Creatinine, Ser 1.37 (*) 0.50 - 1.10 (mg/dL)    GFR calc non Af Amer 49 (*) >90 (mL/min)    GFR calc Af Amer 57 (*) >90 (mL/min)   MAGNESIUM     Status: Normal   Collection Time   10/16/11  2:16 PM      Component Value Range Comment   Magnesium 2.0  1.5 - 2.5 (mg/dL)   HIV ANTIBODY     Status: Normal   Collection Time   10/16/11  2:16 PM      Component Value Range Comment   HIV NON REACTIVE  NON REACTIVE    URINALYSIS, ROUTINE W REFLEX MICROSCOPIC     Status: Abnormal   Collection Time   10/16/11  2:37 PM      Component Value Range Comment  Color, Urine YELLOW  YELLOW     Appearance CLOUDY (*) CLEAR     Specific Gravity, Urine 1.022  1.005 - 1.030     pH 7.0  5.0 - 8.0     Glucose, UA NEGATIVE  NEGATIVE (mg/dL)    Hgb urine dipstick NEGATIVE  NEGATIVE     Bilirubin Urine SMALL (*) NEGATIVE     Ketones, ur 15 (*) NEGATIVE (mg/dL)    Protein, ur 30 (*) NEGATIVE (mg/dL)    Urobilinogen, UA 1.0  0.0 - 1.0 (mg/dL)    Nitrite NEGATIVE  NEGATIVE     Leukocytes, UA NEGATIVE  NEGATIVE    URINE RAPID DRUG SCREEN (HOSP PERFORMED)     Status: Normal   Collection Time   10/16/11  2:37 PM      Component Value Range Comment   Opiates NONE DETECTED  NONE DETECTED     Cocaine NONE DETECTED  NONE DETECTED     Benzodiazepines NONE DETECTED  NONE DETECTED     Amphetamines NONE DETECTED  NONE DETECTED     Tetrahydrocannabinol NONE DETECTED  NONE DETECTED     Barbiturates NONE DETECTED  NONE DETECTED    URINE MICROSCOPIC-ADD ON     Status: Abnormal   Collection Time   10/16/11  2:37 PM      Component Value Range Comment   Squamous Epithelial / LPF MANY (*) RARE     WBC, UA 0-2  <3 (WBC/hpf)    RBC / HPF 0-2  <3 (RBC/hpf)    Bacteria, UA FEW (*) RARE     Casts HYALINE CASTS (*) NEGATIVE    BASIC METABOLIC PANEL      Status: Abnormal   Collection Time   10/16/11  8:07 PM      Component Value Range Comment   Sodium 134 (*) 135 - 145 (mEq/L)    Potassium 3.0 (*) 3.5 - 5.1 (mEq/L)    Chloride 96  96 - 112 (mEq/L) DELTA CHECK NOTED   CO2 28  19 - 32 (mEq/L)    Glucose, Bld 60 (*) 70 - 99 (mg/dL)    BUN 23  6 - 23 (mg/dL)    Creatinine, Ser 6.29 (*) 0.50 - 1.10 (mg/dL)    Calcium 7.7 (*) 8.4 - 10.5 (mg/dL)    GFR calc non Af Amer 62 (*) >90 (mL/min)    GFR calc Af Amer 72 (*) >90 (mL/min)   CBC     Status: Abnormal   Collection Time   10/17/11  7:20 AM      Component Value Range Comment   WBC 6.8  4.0 - 10.5 (K/uL)    RBC 3.03 (*) 3.87 - 5.11 (MIL/uL)    Hemoglobin 9.4 (*) 12.0 - 15.0 (g/dL)    HCT 52.8 (*) 41.3 - 46.0 (%)    MCV 94.4  78.0 - 100.0 (fL)    MCH 31.0  26.0 - 34.0 (pg)    MCHC 32.9  30.0 - 36.0 (g/dL)    RDW 24.4  01.0 - 27.2 (%)    Platelets 263  150 - 400 (K/uL)   BASIC METABOLIC PANEL     Status: Abnormal   Collection Time   10/17/11  7:20 AM      Component Value Range Comment   Sodium 138  135 - 145 (mEq/L)    Potassium 3.4 (*) 3.5 - 5.1 (mEq/L)    Chloride 102  96 - 112 (mEq/L)    CO2 21  19 -  32 (mEq/L)    Glucose, Bld 35 (*) 70 - 99 (mg/dL)    BUN 18  6 - 23 (mg/dL)    Creatinine, Ser 1.61  0.50 - 1.10 (mg/dL)    Calcium 8.2 (*) 8.4 - 10.5 (mg/dL)    GFR calc non Af Amer 80 (*) >90 (mL/min)    GFR calc Af Amer >90  >90 (mL/min)   GLUCOSE, CAPILLARY     Status: Abnormal   Collection Time   10/17/11 10:21 AM      Component Value Range Comment   Glucose-Capillary 118 (*) 70 - 99 (mg/dL)   RETICULOCYTES     Status: Abnormal   Collection Time   10/17/11  2:07 PM      Component Value Range Comment   Retic Ct Pct 0.7  0.4 - 3.1 (%)    RBC. 3.51 (*) 3.87 - 5.11 (MIL/uL)    Retic Count, Manual 24.6  19.0 - 186.0 (K/uL)   CBC     Status: Abnormal   Collection Time   10/17/11  2:07 PM      Component Value Range Comment   WBC 7.8  4.0 - 10.5 (K/uL)    RBC 3.51 (*) 3.87 -  5.11 (MIL/uL)    Hemoglobin 10.8 (*) 12.0 - 15.0 (g/dL)    HCT 09.6 (*) 04.5 - 46.0 (%)    MCV 93.7  78.0 - 100.0 (fL)    MCH 30.8  26.0 - 34.0 (pg)    MCHC 32.8  30.0 - 36.0 (g/dL)    RDW 40.9  81.1 - 91.4 (%)    Platelets 306  150 - 400 (K/uL)    Dg Abd Acute W/chest  10/16/2011  *RADIOLOGY REPORT*  Clinical Data: Vomiting, left upper quadrant pain  ACUTE ABDOMEN SERIES (ABDOMEN 2 VIEW & CHEST 1 VIEW)  Comparison: CT abdomen pelvis of 09/11/2011  Findings: The lungs are clear and slightly hyperaerated. Mediastinal contours appear normal.  The heart is within normal limits in size. No bony abnormality is seen.  Supine and erect views of the abdomen show no evidence of bowel obstruction.  No free air is seen on the erect view.  Surgical clips are present in the right upper quadrant from cholecystectomy. No opaque calculi are noted.  IMPRESSION:  1.  No active lung disease.  Slight hyperaeration. 2.  No bowel obstruction.  No free air.  Original Report Authenticated By: Juline Patch, M.D.       Assessment/Plan 1. Persistent nausea and vomiting 2. Duodenal ulcer with obstruction 3. GERD 4. History of hypertension  Plan: I will discuss this patient with Dr. Michaell Cowing. I have discussed this patient with Dr. Dorena Cookey of gastroenterology. Initially, we will likely plan on treating the patient with conservative management including aggressive PPI therapy and n.p.o. status. We would like to hold off on surgical intervention unless absolutely necessary. We may need to get an upper GI study to evaluate this obstruction to determine how much of the lumen is patent. In the meantime, if the patient can tolerate sips of clears, this is fine. She may require nutritional supplement with TNA for additional support. I have currently placed the patient on Protonix 40 mg IV twice a day. Further recommendations will be made after discussion with Dr. Michaell Cowing.  OSBORNE,KELLY E 10/17/2011, 2:32 PM  Pt w  near-obstructing duodenal inflammation presumed due to ulcer.  She is obviously frustrated & impatient to have the problem immediately resolved (which it cannot).  -F/u biopsies (  if done at EGD) ?Hpylori +? Any cancer? -PPI (?H2Blocker also) per GI.  ?3-6 months minimum vs indefinitely.  Consider repeat endoscopy but defer to GI. -No NSAIDs  -No carbonation (fizzy acid BAD) -It is reasonable to get an UGI w Gastrograffin to see if this is a complete obstruction.  If not, try thin liquids -NGT if worse.   This is the first evidence of ulcer disease in this patient.  PPIs should help heal the edema and ulcer over time, hopefully in 1-2 weeks a significant improvement.  Let GI guide care on this ulcer pt.    The patient is stable.  There is no evidence of peritonitis, acute abdomen, nor shock.  There is no strong evidence of failure of improvement nor decline with current non-operative management.  There is no need for surgery at the present moment.  We will available should the patient's status markedly worsen or fail to improve.    Surgery should be the LAST OPTION in this patient.  It would involve major resection & reconstruction with even more prolonged hospital stay/recovery.  I would not rush to this.  It is inconvenient for her to have this, but avoiding treatment will have far worse consequences.

## 2011-10-18 ENCOUNTER — Encounter (HOSPITAL_COMMUNITY): Payer: Self-pay | Admitting: General Practice

## 2011-10-18 ENCOUNTER — Inpatient Hospital Stay (HOSPITAL_COMMUNITY): Payer: BC Managed Care – PPO

## 2011-10-18 DIAGNOSIS — R1110 Vomiting, unspecified: Secondary | ICD-10-CM

## 2011-10-18 LAB — BASIC METABOLIC PANEL
Calcium: 8.6 mg/dL (ref 8.4–10.5)
Creatinine, Ser: 0.69 mg/dL (ref 0.50–1.10)
GFR calc Af Amer: >90 mL/min (ref >90)
GFR calc non Af Amer: >90 mL/min (ref >90)

## 2011-10-18 LAB — CBC
MCH: 30.8 pg (ref 26.0–34.0)
MCHC: 33.3 g/dL (ref 30.0–36.0)
MCV: 92.5 fL (ref 78.0–100.0)
Platelets: 278 10*3/uL (ref 150–400)
RDW: 13.4 % (ref 11.5–15.5)

## 2011-10-18 MED ORDER — BOOST / RESOURCE BREEZE PO LIQD
1.0000 | Freq: Three times a day (TID) | ORAL | Status: DC
  Administered 2011-10-18 – 2011-10-20 (×5): 1 via ORAL

## 2011-10-18 MED ORDER — IOHEXOL 300 MG/ML  SOLN
125.0000 mL | Freq: Once | INTRAMUSCULAR | Status: AC | PRN
  Administered 2011-10-18: 125 mL via ORAL

## 2011-10-18 NOTE — Progress Notes (Signed)
Internal Medicine Attending  Date: 10/18/2011  Patient name: [name] Barnett Medical record number: [id number] Date of birth: [birth year] Age: 35 y.o. Gender: female  I saw and evaluated the patient. I reviewed the resident's note by Dr. Abner Greenspan;  see his note for details of clinical findings and plans.  Upper GI study did not show small bowel obstruction.  Plan is to continue clear liquid diet and possibly advance to full liquids if GI consult agrees; continue intravenous PPI therapy.

## 2011-10-18 NOTE — Progress Notes (Signed)
INITIAL ADULT NUTRITION ASSESSMENT Date: 10/18/2011   Time: 11:07 AM Reason for Assessment: Consult  ASSESSMENT: Female 35 y.o.  Dx: Nausea and vomiting  Patient Active Problem List  Diagnoses  . HTN (hypertension)  . S/P laparoscopic cholecystectomy  . Nausea and vomiting  . Tobacco abuse  . Duodenal ulcer, acute with obstruction    Hx:  Past Medical History  Diagnosis Date  . Hypertension   . GERD (gastroesophageal reflux disease)    Related Meds:     . enoxaparin  40 mg Subcutaneous Q24H  . pantoprazole (PROTONIX) IV  40 mg Intravenous Q12H    Ht: 5\' 3"  (160 cm)  Wt: 126 lb 15.8 oz (57.6 kg)  Ideal Wt: 52.4 kg  % Ideal Wt: 110%  Usual Wt: 145lbs august % Usual Wt: 87%  Body mass index is 22.49 kg/(m^2).  Food/Nutrition Related Hx: 1 week N/V and about 5-6lbs wt loss x 1 week. Started having early satiety after gallbladder removed  Labs:      Sodium 141 mEq/L      Potassium 3.6 mEq/L      Chloride 109 mEq/L      CO2 24 mEq/L      Glucose, Bld 85 mg/dL      BUN 9 mg/dL      Creatinine, Ser 1.61 mg/dL      Calcium 8.6 mg/dL      GFR calc non Af Amer >90 mL/min      GFR calc Af Amer >90 mL/min   Intake: I/O last 3 completed shifts: In: 3907.5 [P.O.:960; I.V.:2197.5; IV Piggyback:750] Out: 2100 [Urine:2100] Total I/O In: 240 [P.O.:240] Out: -     Diet Order: Clear Liquid  Supplements/Tube Feeding:  IVF:    sodium chloride    Estimated Nutritional Needs:   Kcal: 1450-1750 Protein: 60-75gm Fluid: 54ml/kcal  NUTRITION DIAGNOSIS: -Inadequate oral intake (NI-2.1).  Status: Ongoing  RELATED TO: obstruction n/v  AS EVIDENCE BY:  5% wt loss x 1week, N/V for one week.   MONITORING/EVALUATION(Goals): Goal meet greater than or equal 90% nutrition needs Monitor: diet advancement, po intake, wt trends, labs  EDUCATION NEEDS: -No education needs identified at this time  INTERVENTION: 1. Continue with clear liquids if tolerating advance as tolerated  to regular diet 6 small meals 2. While on clear liquids add resource breeze TID p.o. To better meet nutritional needs  Dietitian [phone]  DOCUMENTATION CODES Per approved criteria  -Not Applicable    Debbe Mounts 10/18/2011, 11:07 AM

## 2011-10-18 NOTE — Progress Notes (Signed)
[facility] Progress Note  Subjective: Patient without nausea or vomiting. She is frustrated regarding her diagnosis and the fact that there is no easy or quick solution that is not without some risk of morbidity.  Objective: Vital signs in last 24 hours: Temp:  [97.1 F (36.2 C)-98 F (36.7 C)] 97.4 F (36.3 C) (11/08 0459) Pulse Rate:  [65-92] 74  (11/08 0459) Resp:  [12-20] 18  (11/08 0459) BP: (100-141)/(66-112) 116/77 mmHg (11/08 0459) SpO2:  [95 %-100 %] 96 % (11/08 0459) Weight:  [57.6 kg (126 lb 15.8 oz)] 126 lb 15.8 oz (57.6 kg) (11/08 0459) Weight change: 2.7 kg (5 lb 15.2 oz)   PE: Unremarkable. Abdomen soft.  Lab Results: Results for orders placed during the hospital encounter of 10/16/11 (from the past 24 hour(s))  GLUCOSE, CAPILLARY     Status: Abnormal   Collection Time   10/17/11 10:21 AM      Component Value Range   Glucose-Capillary 118 (*) 70 - 99 (mg/dL)  BASIC METABOLIC PANEL     Status: Abnormal   Collection Time   10/17/11  2:07 PM      Component Value Range   Sodium 140  135 - 145 (mEq/L)   Potassium 3.8  3.5 - 5.1 (mEq/L)   Chloride 105  96 - 112 (mEq/L)   CO2 24  19 - 32 (mEq/L)   Glucose, Bld 61 (*) 70 - 99 (mg/dL)   BUN 15  6 - 23 (mg/dL)   Creatinine, Ser 4.54  0.50 - 1.10 (mg/dL)   Calcium 8.6  8.4 - 09.8 (mg/dL)   GFR calc non Af Amer 90 (*) >90 (mL/min)   GFR calc Af Amer >90  >90 (mL/min)  VITAMIN B12     Status: Abnormal   Collection Time   10/17/11  2:07 PM      Component Value Range   Vitamin B-12 1270 (*) 211 - 911 (pg/mL)  FOLATE     Status: Normal   Collection Time   10/17/11  2:07 PM      Component Value Range   Folate 6.0    IRON AND TIBC     Status: Normal   Collection Time   10/17/11  2:07 PM      Component Value Range   Iron 60  42 - 135 (ug/dL)   TIBC 119  147 - 829 (ug/dL)   Saturation Ratios 24  20 - 55 (%)   UIBC 194  125 - 400 (ug/dL)  FERRITIN     Status: Normal   Collection Time   10/17/11  2:07 PM        Component Value Range   Ferritin 53  10 - 291 (ng/mL)  RETICULOCYTES     Status: Abnormal   Collection Time   10/17/11  2:07 PM      Component Value Range   Retic Ct Pct 0.7  0.4 - 3.1 (%)   RBC. 3.51 (*) 3.87 - 5.11 (MIL/uL)   Retic Count, Manual 24.6  19.0 - 186.0 (K/uL)  CBC     Status: Abnormal   Collection Time   10/17/11  2:07 PM      Component Value Range   WBC 7.8  4.0 - 10.5 (K/uL)   RBC 3.51 (*) 3.87 - 5.11 (MIL/uL)   Hemoglobin 10.8 (*) 12.0 - 15.0 (g/dL)   HCT 56.2 (*) 13.0 - 46.0 (%)   MCV 93.7  78.0 - 100.0 (fL)   MCH 30.8  26.0 -  34.0 (pg)   MCHC 32.8  30.0 - 36.0 (g/dL)   RDW 40.9  81.1 - 91.4 (%)   Platelets 306  150 - 400 (K/uL)  CBC     Status: Abnormal   Collection Time   10/18/11  6:50 AM      Component Value Range   WBC 6.0  4.0 - 10.5 (K/uL)   RBC 3.18 (*) 3.87 - 5.11 (MIL/uL)   Hemoglobin 9.8 (*) 12.0 - 15.0 (g/dL)   HCT 78.2 (*) 95.6 - 46.0 (%)   MCV 92.5  78.0 - 100.0 (fL)   MCH 30.8  26.0 - 34.0 (pg)   MCHC 33.3  30.0 - 36.0 (g/dL)   RDW 21.3  08.6 - 57.8 (%)   Platelets 278  150 - 400 (K/uL)  BASIC METABOLIC PANEL     Status: Normal   Collection Time   10/18/11  6:50 AM      Component Value Range   Sodium 141  135 - 145 (mEq/L)   Potassium 3.6  3.5 - 5.1 (mEq/L)   Chloride 109  96 - 112 (mEq/L)   CO2 24  19 - 32 (mEq/L)   Glucose, Bld 85  70 - 99 (mg/dL)   BUN 9  6 - 23 (mg/dL)   Creatinine, Ser 4.69  0.50 - 1.10 (mg/dL)   Calcium 8.6  8.4 - 62.9 (mg/dL)   GFR calc non Af Amer >90  >90 (mL/min)   GFR calc Af Amer >90  >90 (mL/min)    Studies/Results: Gastrografin swallow just completed, results pending.   Assessment: Duodenal ulcer with duodenal outlet obstruction.  Plan: 1. Await Gastrografin study. 2. Continue IV Protonix 3. Begin clear liquid diet 4. Check H. pylori status Confer with surgery regarding threshold for surgery.   Earlie Arciga C 10/18/2011, 9:05 AM

## 2011-10-18 NOTE — Progress Notes (Signed)
Subjective: Tolerating clears well.  No further n/v.  Tolerated gastrograffin study well.  Objective: Vital signs in last 24 hours: Filed Vitals:   10/17/11 1239 10/17/11 1500 10/17/11 2029 10/18/11 0459  BP: 131/77 100/72 101/68 116/77  Pulse: 74 72 66 74  Temp:  98 F (36.7 C) 97.5 F (36.4 C) 97.4 F (36.3 C)  TempSrc:      Resp: 12 16 18 18   Height:      Weight:    126 lb 15.8 oz (57.6 kg)  SpO2: 96% 96% 100% 96%   Weight change: 5 lb 15.2 oz (2.7 kg)  Intake/Output Summary (Last 24 hours) at 10/18/11 1050 Last data filed at 10/18/11 0900  Gross per 24 hour  Intake   1670 ml  Output    850 ml  Net    820 ml   Physical Exam: Physical Exam GEN: NAD.  Alert and oriented x 3.  Pleasant, conversant, and cooperative to exam. RESP:  CTAB, no w/r/r CARDIOVASCULAR: RRR, S1, S2, no m/r/g ABDOMEN: soft, NT/ND, NABS EXT: warm and dry. No edema in b/l LE  Lab Results: Basic Metabolic Panel:  Lab 10/18/11 8657 10/17/11 1407 10/16/11 1416  NA 141 140 --  K 3.6 3.8 --  CL 109 105 --  CO2 24 24 --  GLUCOSE 85 61* --  BUN 9 15 --  CREATININE 0.69 0.83 --  CALCIUM 8.6 8.6 --  MG -- -- 2.0  PHOS -- -- --   Liver Function Tests:  Lab 10/16/11 0922  AST 20  ALT 18  ALKPHOS 111  BILITOT 0.2*  PROT 7.4  ALBUMIN 3.8    Lab 10/16/11 0922  LIPASE 38  AMYLASE --   No results found for this basename: AMMONIA:2 in the last 168 hours CBC:  Lab 10/18/11 0650 10/17/11 1407 10/16/11 0922  WBC 6.0 7.8 --  NEUTROABS -- -- 5.1  HGB 9.8* 10.8* --  HCT 29.4* 32.9* --  MCV 92.5 93.7 --  PLT 278 306 --   CBG:  Lab 10/17/11 1021  GLUCAP 118*   Anemia Panel:  Lab 10/17/11 1407  VITAMINB12 1270*  FOLATE 6.0  FERRITIN 53  TIBC 254  IRON 60  RETICCTPCT 0.7     Micro Results: No results found for this or any previous visit (from the past 240 hour(s)). Studies/Results: Dg Ugi W/water Sol Cm  10/18/2011  *RADIOLOGY REPORT*  Clinical Data:Evaluate for duodenal  obstruction  WATER SOLUBLE UPPER GI SERIES  Technique:  Single-column upper GI series was performed using water soluble contrast.  Fluoroscopy Time: 1.32  Contrast: 125 ml Omnipaque-300 IV  Comparison: CT abdomen/pelvis dated 09/11/2011.  Findings: Scout radiograph demonstrates cholecystectomy clips and a nonobstructive bowel gas pattern.  Normal esophageal peristalsis.  Normal gastric folds.  Stable mild prominence of the duodenal bulb.  Contrast spills into the second portion of the duodenum, which is nondilated.  Although evaluation is constrained by use of water soluble contrast, dilute contrast eventually flows into distal loops of nondilated small bowel.  No evidence of small bowel obstruction.  IMPRESSION: No evidence of small bowel obstruction.  Stable mild prominence of the duodenal bulb.  Original Report Authenticated By: Charline Bills, M.D.   Medications: I have reviewed the patient's current medications. Scheduled Meds:   . enoxaparin  40 mg Subcutaneous Q24H  . pantoprazole (PROTONIX) IV  40 mg Intravenous Q12H   Continuous Infusions:   . sodium chloride     PRN Meds:.iohexol, morphine, promethazine, promethazine, DISCONTD:  butamben-tetracaine-benzocaine, DISCONTD: diphenhydrAMINE, DISCONTD: fentaNYL, DISCONTD: midazolam Assessment/Plan: # Duodenal ulcer with obstruction Gastrograffin study today revealed passage of fluid, though delayed.  Pt tolerating clears well.  Will await formal rec's from GI and Surg, but likely clears-->full liquids for some extended time to allow ulcer to heal (try to avoid surg).  Will get nutrition consult for help with diet as it progresses. Getting H pylori Ab.  Wonder about treating even if its negative - appreciate GI rec's - f/u h pylori test - treat anyway? - f/u gensurg recs: surger not a good option  - PPI IV - clear liquid diet, await advice from GI about advancing  # Anemia  Pt p/w relatively acute anemia, likely a reflection of fluid  status, but also could be related to ulcer. No s/s of bleeding, but will watch closely.  Anemia panel indicates some degree of Fe deficiency. - CBC trend  - consider Fe supplementation   #PPx:  lovenox   LOS: 2 days   WILDMAN-TOBRINER, BEN 10/18/2011, 10:50 AM

## 2011-10-18 NOTE — Progress Notes (Signed)
1 Day Post-Op  Subjective: Feels better today.  Toerating clears without difficulty. UGI shows slow  flow into duodenum, no obstruction.  Objective: Vital signs in last 24 hours: Temp:  [97.4 F (36.3 C)-98 F (36.7 C)] 97.4 F (36.3 C) (11/08 0459) Pulse Rate:  [66-92] 74  (11/08 0459) Resp:  [12-20] 18  (11/08 0459) BP: (100-141)/(68-112) 116/77 mmHg (11/08 0459) SpO2:  [95 %-100 %] 96 % (11/08 0459) Weight:  [126 lb 15.8 oz (57.6 kg)] 126 lb 15.8 oz (57.6 kg) (11/08 0459) Last BM Date: 10/17/11  Intake/Output from previous day: 11/07 0701 - 11/08 0700 In: 1430 [P.O.:480; I.V.:500; IV Piggyback:450] Out: 1250 [Urine:1250] Intake/Output this shift: Total I/O In: 240 [P.O.:240] Out: -   General appearance: alert, cooperative and no distress Throat: lips, mucosa, and tongue normal; teeth and gums normal Resp: clear to auscultation bilaterally GI: soft, non-tender; bowel sounds normal; no masses,  no organomegaly and Abd incisions healing nicely.  Lab Results:   Basename 10/18/11 0650 10/17/11 1407  WBC 6.0 7.8  HGB 9.8* 10.8*  HCT 29.4* 32.9*  PLT 278 306    BMET  Basename 10/18/11 0650 10/17/11 1407  NA 141 140  K 3.6 3.8  CL 109 105  CO2 24 24  GLUCOSE 85 61*  BUN 9 15  CREATININE 0.69 0.83  CALCIUM 8.6 8.6   PT/INR No results found for this basename: LABPROT:2,INR:2 in the last 72 hours   Studies/Results: Dg Ugi W/water Sol Cm  10/18/2011  *RADIOLOGY REPORT*  Clinical Data:Evaluate for duodenal obstruction  WATER SOLUBLE UPPER GI SERIES  Technique:  Single-column upper GI series was performed using water soluble contrast.  Fluoroscopy Time: 1.32  Contrast: 125 ml Omnipaque-300 IV  Comparison: CT abdomen/pelvis dated 09/11/2011.  Findings: Scout radiograph demonstrates cholecystectomy clips and a nonobstructive bowel gas pattern.  Normal esophageal peristalsis.  Normal gastric folds.  Stable mild prominence of the duodenal bulb.  Contrast spills into the  second portion of the duodenum, which is nondilated.  Although evaluation is constrained by use of water soluble contrast, dilute contrast eventually flows into distal loops of nondilated small bowel.  No evidence of small bowel obstruction.  IMPRESSION: No evidence of small bowel obstruction.  Stable mild prominence of the duodenal bulb.  Original Report Authenticated By: Charline Bills, M.D.    Anti-infectives: Anti-infectives    None     Current Facility-Administered Medications  Medication Dose Route Frequency Provider Last Rate Last Dose  . 0.9 %  sodium chloride infusion   Intravenous Continuous Nelda Bucks      . enoxaparin (LOVENOX) injection 40 mg  40 mg Subcutaneous Q24H Kristin Mills   40 mg at 10/17/11 1415  . iohexol (OMNIPAQUE) 300 MG/ML injection 125 mL  125 mL Oral Once PRN Sriyesh Krishnan   125 mL at 10/18/11 0830  . morphine 2 MG/ML injection 1 mg  1 mg Intravenous Q4H PRN Nelda Bucks      . pantoprazole (PROTONIX) injection 40 mg  40 mg Intravenous Q12H Letha Cape, PA   40 mg at 10/18/11 1117  . promethazine (PHENERGAN) tablet 12.5 mg  12.5 mg Oral Q6H PRN Nelda Bucks   12.5 mg at 10/16/11 2332   Or  . promethazine (PHENERGAN) injection 12.5 mg  12.5 mg Intravenous Q6H PRN Nelda Bucks      . DISCONTD: butamben-tetracaine-benzocaine (CETACAINE) spray    PRN Barrie Folk, MD   2 spray at 10/17/11 1142  . DISCONTD: diphenhydrAMINE (BENADRYL) injection  PRN Barrie Folk, MD   25 mg at 10/17/11 1145  . DISCONTD: fentaNYL (SUBLIMAZE) injection    PRN Barrie Folk, MD   25 mcg at 10/17/11 1152  . DISCONTD: midazolam (VERSED) 10 MG/2ML injection    PRN Barrie Folk, MD   1 mg at 10/17/11 1154    Assessment/Plan  1. Duodenal ulcer without obstruction. 2. S/P cholecystectomy. Plan: Medical management.  Pt also reports she's going to stop smoking.  LOS: 2 days    [name] Barnett,[name] Barnett 10/18/2011  UGI shows no mjr obstruction at duodenum. I d/w GI (Dr.  Madilyn Fireman)  -agree with liquids -maybe adv to pureed diet tomorrow if tolerates liquids -? Change to oral PPI per GI -Hpylori w/u & Tx per GI  This is the first evidence of ulcer disease in this patient. PPIs should help improve the the edema and heal the ulcer over time, hopefully in 1-2 weeks a significant improvement. Let GI guide care on this ulcer pt.    The patient is stable. There is no evidence of peritonitis, acute abdomen, nor shock. There is no strong evidence of failure of improvement nor decline with current non-operative management. There is no need for surgery at the present moment. We will available should the patient's status markedly worsen or fail to improve.   Surgery should be the LAST OPTION in this patient. It would involve major resection & reconstruction with even more prolonged hospital stay/recovery. I would not rush to this. It is inconvenient & frustrating for her to have this, but avoiding treatment will have far worse consequences.

## 2011-10-19 ENCOUNTER — Encounter (HOSPITAL_COMMUNITY): Payer: Self-pay

## 2011-10-19 LAB — OCCULT BLOOD X 1 CARD TO LAB, STOOL: Fecal Occult Bld: NEGATIVE

## 2011-10-19 LAB — CBC
HCT: 29.5 % — ABNORMAL LOW (ref 36.0–46.0)
MCHC: 32.9 g/dL (ref 30.0–36.0)
RDW: 13.6 % (ref 11.5–15.5)

## 2011-10-19 MED ORDER — PANTOPRAZOLE SODIUM 40 MG PO TBEC
40.0000 mg | DELAYED_RELEASE_TABLET | Freq: Every day | ORAL | Status: DC
  Administered 2011-10-19: 40 mg via ORAL
  Filled 2011-10-19: qty 1

## 2011-10-19 NOTE — Progress Notes (Signed)
[facility] Progress Note  Subjective: The patient has no complaints of nausea or vomiting. She is eager to advance her diet can go home when possible.  Objective: Vital signs in last 24 hours: Temp:  [97.2 F (36.2 C)-98.7 F (37.1 C)] 97.2 F (36.2 C) (11/09 0409) Pulse Rate:  [66-72] 70  (11/09 0409) Resp:  [18] 18  (11/09 0409) BP: (124-141)/(71-94) 126/84 mmHg (11/09 0409) SpO2:  [97 %-99 %] 97 % (11/09 0409) Weight:  [59 kg (130 lb 1.1 oz)] 130 lb 1.1 oz (59 kg) (11/09 0409) Weight change: 1.4 kg (3 lb 1.4 oz)   PE: Unchanged  Lab Results: Results for orders placed during the hospital encounter of 10/16/11 (from the past 24 hour(s))  CBC     Status: Abnormal   Collection Time   10/19/11  5:55 AM      Component Value Range   WBC 6.2  4.0 - 10.5 (K/uL)   RBC 3.16 (*) 3.87 - 5.11 (MIL/uL)   Hemoglobin 9.7 (*) 12.0 - 15.0 (g/dL)   HCT 47.8 (*) 29.5 - 46.0 (%)   MCV 93.4  78.0 - 100.0 (fL)   MCH 30.7  26.0 - 34.0 (pg)   MCHC 32.9  30.0 - 36.0 (g/dL)   RDW 62.1  30.8 - 65.7 (%)   Platelets 262  150 - 400 (K/uL)    Studies/Results: Dg Abd Acute W/chest  10/16/2011  *RADIOLOGY REPORT*  Clinical Data: Vomiting, left upper quadrant pain  ACUTE ABDOMEN SERIES (ABDOMEN 2 VIEW & CHEST 1 VIEW)  Comparison: CT abdomen pelvis of 09/11/2011  Findings: The lungs are clear and slightly hyperaerated. Mediastinal contours appear normal.  The heart is within normal limits in size. No bony abnormality is seen.  Supine and erect views of the abdomen show no evidence of bowel obstruction.  No free air is seen on the erect view.  Surgical clips are present in the right upper quadrant from cholecystectomy. No opaque calculi are noted.  IMPRESSION:  1.  No active lung disease.  Slight hyperaeration. 2.  No bowel obstruction.  No free air.  Original Report Authenticated By: Juline Patch, M.D.   Dg Kayleen Memos W/water Sol Cm  10/18/2011  *RADIOLOGY REPORT*  Clinical Data:Evaluate for duodenal  obstruction  WATER SOLUBLE UPPER GI SERIES  Technique:  Single-column upper GI series was performed using water soluble contrast.  Fluoroscopy Time: 1.32  Contrast: 125 ml Omnipaque-300 IV  Comparison: CT abdomen/pelvis dated 09/11/2011.  Findings: Scout radiograph demonstrates cholecystectomy clips and a nonobstructive bowel gas pattern.  Normal esophageal peristalsis.  Normal gastric folds.  Stable mild prominence of the duodenal bulb.  Contrast spills into the second portion of the duodenum, which is nondilated.  Although evaluation is constrained by use of water soluble contrast, dilute contrast eventually flows into distal loops of nondilated small bowel.  No evidence of small bowel obstruction.  IMPRESSION: No evidence of small bowel obstruction.  Stable mild prominence of the duodenal bulb.  Original Report Authenticated By: Charline Bills, M.D.  H. pylori antibody is pending    Assessment: Second portion duodenal bulb ulcer with apparent high-grade obstruction endoscopically.  Plan: 1. I am a little puzzled by the apparent discrepancy between my endoscopic impression and the barium study regarding the degree of her obstruction but hopefully the latter portends hoped for medical success and avoidance of surgery. 2. We'll continue double dose proton pump inhibitor 3. Treatment for eradication of Helicobacter if serology positive 4. Advanced appear a diet  and if tolerates this and soft mechanical diet can possibly go home in the next day or so with followup with me.    Chonda Baney C 10/19/2011, 8:34 AM

## 2011-10-19 NOTE — Progress Notes (Signed)
Internal Medicine Attending  Date: 10/19/2011  Patient name: [name] Barnett Medical record number: [id number] Date of birth: December 27, 1975 Age: 35 y.o. Gender: female  I saw and evaluated the patient and discussed her care with the resident Dr. Abner Greenspan.  She had a soft diet for breakfast and reports no problems so far.  Plan is to continue PPI therapy and current diet.

## 2011-10-19 NOTE — Progress Notes (Signed)
Subjective: Feels well, no N/V.  Some blood in toilet.  Pt menstruating but says this is different.  Hemoccult collected.  Vitals stable.  Objective: Vital signs in last 24 hours: Filed Vitals:   10/18/11 0459 10/18/11 1500 10/18/11 2130 10/19/11 0409  BP: 116/77 141/94 124/71 126/84  Pulse: 74 72 66 70  Temp: 97.4 F (36.3 C) 98.7 F (37.1 C) 98 F (36.7 C) 97.2 F (36.2 C)  TempSrc:  Oral    Resp: 18 18 18 18   Height:      Weight: 126 lb 15.8 oz (57.6 kg)   130 lb 1.1 oz (59 kg)  SpO2: 96% 99% 97% 97%   Weight change: 3 lb 1.4 oz (1.4 kg)  Intake/Output Summary (Last 24 hours) at 10/19/11 1056 Last data filed at 10/19/11 1000  Gross per 24 hour  Intake    840 ml  Output   1151 ml  Net   -311 ml   Physical Exam: GEN: NAD. Alert and oriented x 3. Pleasant, conversant, and cooperative to exam.  RESP: CTAB, no w/r/r  CARDIOVASCULAR: RRR, S1, S2, no m/r/g  ABDOMEN: soft, NT/ND, NABS  EXT: warm and dry. No edema in b/l LE  Lab Results: Basic Metabolic Panel:  Lab 10/18/11 4098 10/17/11 1407 10/16/11 1416  NA 141 140 --  K 3.6 3.8 --  CL 109 105 --  CO2 24 24 --  GLUCOSE 85 61* --  BUN 9 15 --  CREATININE 0.69 0.83 --  CALCIUM 8.6 8.6 --  MG -- -- 2.0  PHOS -- -- --   Liver Function Tests:  Lab 10/16/11 0922  AST 20  ALT 18  ALKPHOS 111  BILITOT 0.2*  PROT 7.4  ALBUMIN 3.8    Lab 10/16/11 0922  LIPASE 38  AMYLASE --   No results found for this basename: AMMONIA:2 in the last 168 hours CBC:  Lab 10/19/11 0555 10/18/11 0650 10/16/11 0922  WBC 6.2 6.0 --  NEUTROABS -- -- 5.1  HGB 9.7* 9.8* --  HCT 29.5* 29.4* --  MCV 93.4 92.5 --  PLT 262 278 --   CBG:  Lab 10/17/11 1021  GLUCAP 118*   Anemia Panel:  Lab 10/17/11 1407  VITAMINB12 1270*  FOLATE 6.0  FERRITIN 53  TIBC 254  IRON 60  RETICCTPCT 0.7    Micro Results: No results found for this or any previous visit (from the past 240 hour(s)). Studies/Results: Dg Ugi W/water Sol  Cm  10/18/2011  *RADIOLOGY REPORT*  Clinical Data:Evaluate for duodenal obstruction  WATER SOLUBLE UPPER GI SERIES  Technique:  Single-column upper GI series was performed using water soluble contrast.  Fluoroscopy Time: 1.32  Contrast: 125 ml Omnipaque-300 IV  Comparison: CT abdomen/pelvis dated 09/11/2011.  Findings: Scout radiograph demonstrates cholecystectomy clips and a nonobstructive bowel gas pattern.  Normal esophageal peristalsis.  Normal gastric folds.  Stable mild prominence of the duodenal bulb.  Contrast spills into the second portion of the duodenum, which is nondilated.  Although evaluation is constrained by use of water soluble contrast, dilute contrast eventually flows into distal loops of nondilated small bowel.  No evidence of small bowel obstruction.  IMPRESSION: No evidence of small bowel obstruction.  Stable mild prominence of the duodenal bulb.  Original Report Authenticated By: Charline Bills, M.D.   Medications: I have reviewed the patient's current medications. Scheduled Meds:   . enoxaparin  40 mg Subcutaneous Q24H  . feeding supplement  1 Container Oral TID WC  . pantoprazole  40 mg Oral Q1200  . DISCONTD: pantoprazole (PROTONIX) IV  40 mg Intravenous Q12H   Continuous Infusions:   . DISCONTD: sodium chloride     PRN Meds:.morphine, promethazine, promethazine  Assessment/Plan: # Duodenal ulcer with obstruction  Pt doing well, no N/V.  GI rec's for dysphagia diet, soft mechanical to follow.  If tolerates, likely d/c with close f/u.  Surgery signing off, hoping to avoid any operation.  H pylori Ab pending, will f/u results.  Will defer to GI opinion on d/c status, pending how she tolerates diet. - appreciate GI rec's  - appreciate nutrition rec's - f/u h pylori test - PPI IV  - advancing towards soft mechanical as tolerated  # Anemia  Pt p/w relatively acute anemia, likely a reflection of fluid status, but also could be related to ulcer. Anemia panel indicates  some degree of Fe deficiency.  Reticulocyte count appears inappropriately normal in the setting of bleeding. - CBC trend  - consider Fe supplementation  - f/u hemoccult  #PPx:  lovenox   LOS: 3 days   WILDMAN-TOBRINER, BEN 10/19/2011, 10:56 AM

## 2011-10-19 NOTE — Progress Notes (Signed)
2 Days Post-Op  Subjective: Pt without c/o.  Tolerating a mechanical soft diet currently without any problems.  Objective: Vital signs in last 24 hours: Temp:  [97.2 F (36.2 C)-98.7 F (37.1 C)] 97.2 F (36.2 C) (11/09 0409) Pulse Rate:  [66-72] 70  (11/09 0409) Resp:  [18] 18  (11/09 0409) BP: (124-141)/(71-94) 126/84 mmHg (11/09 0409) SpO2:  [97 %-99 %] 97 % (11/09 0409) Weight:  [130 lb 1.1 oz (59 kg)] 130 lb 1.1 oz (59 kg) (11/09 0409) Last BM Date: 10/17/11  Intake/Output from previous day: 11/08 0701 - 11/09 0700 In: 840 [P.O.:840] Out: 800 [Urine:800] Intake/Output this shift:    PE: Abd: soft, NT, ND, +BS  Lab Results:   [facility] 10/19/11 0555 10/18/11 0650  WBC 6.2 6.0  HGB 9.7* 9.8*  HCT 29.5* 29.4*  PLT 262 278   BMET  Basename 10/18/11 0650 10/17/11 1407  NA 141 140  K 3.6 3.8  CL 109 105  CO2 24 24  GLUCOSE 85 61*  BUN 9 15  CREATININE 0.69 0.83  CALCIUM 8.6 8.6   PT/INR No results found for this basename: LABPROT:2,INR:2 in the last 72 hours   Studies/Results: @RISRSLT2 @  Anti-infectives:    Assessment/Plan  1. Duodenal ulcer with partial obstruction  Plan: 1. Agree with GI's note.  Aggressive PPI therapy.  Follow-up with GI as outpt for repeat ENDO.  Would not pursue surgery unless all conservative management fails.  Will sign off.  Please call as needed.  2. IF pt is unable to tolerate a mechanical soft diet, should d/c on fulls...yogurt, breeze, pudding, protein shakes, milkshakes, etc.  LOS: 3 days    OSBORNE,KELLY E 10/19/2011  Ardeth Sportsman, M.D., F.A.C.S. Gastrointestinal and Minimally Invasive Surgery [facility] [address] [place], [place] [address] [phone] Main / Paging [phone] Voice Mail

## 2011-10-20 LAB — CBC
HCT: 29.2 % — ABNORMAL LOW (ref 36.0–46.0)
Hemoglobin: 9.8 g/dL — ABNORMAL LOW (ref 12.0–15.0)
MCH: 31.2 pg (ref 26.0–34.0)
MCHC: 33.6 g/dL (ref 30.0–36.0)
MCV: 93 fL (ref 78.0–100.0)
Platelets: 269 10*3/uL (ref 150–400)
RBC: 3.14 MIL/uL — ABNORMAL LOW (ref 3.87–5.11)
RDW: 13.7 % (ref 11.5–15.5)
WBC: 6.7 10*3/uL (ref 4.0–10.5)

## 2011-10-20 MED ORDER — DEXLANSOPRAZOLE 60 MG PO CPDR: 60.0000 mg | DELAYED_RELEASE_CAPSULE | Freq: Two times a day (BID) | ORAL | Status: DC

## 2011-10-20 NOTE — Progress Notes (Signed)
Subjective: Feels well, diet advanced yesterday.  No N/V, no pain.  Objective: Vital signs in last 24 hours: Filed Vitals:   10/19/11 0409 10/19/11 1500 10/19/11 2111 10/20/11 0507  BP: 126/84 117/80 147/89 142/83  Pulse: 70 71 63 65  Temp: 97.2 F (36.2 C) 97.1 F (36.2 C) 97.5 F (36.4 C) 97.6 F (36.4 C)  TempSrc:      Resp: 18 16 16 16   Height:      Weight: 130 lb 1.1 oz (59 kg)   132 lb (59.875 kg)  SpO2: 97% 98% 99% 98%   Weight change: 1 lb 14.9 oz (0.875 kg)  Intake/Output Summary (Last 24 hours) at 10/20/11 0811 Last data filed at 10/19/11 2100  Gross per 24 hour  Intake   1320 ml  Output    751 ml  Net    569 ml   Physical Exam: Physical Exam GEN: NAD.  Alert and oriented x 3.  Pleasant, conversant, and cooperative to exam. RESP:  CTAB, no w/r/r CARDIOVASCULAR: RRR, S1, S2, no m/r/g ABDOMEN: soft, NT/ND, NABS EXT: warm and dry. No edema in b/l LE  Lab Results: Basic Metabolic Panel:  Lab 10/18/11 6962 10/17/11 1407 10/16/11 1416  NA 141 140 --  K 3.6 3.8 --  CL 109 105 --  CO2 24 24 --  GLUCOSE 85 61* --  BUN 9 15 --  CREATININE 0.69 0.83 --  CALCIUM 8.6 8.6 --  MG -- -- 2.0  PHOS -- -- --   Liver Function Tests:  Lab 10/16/11 0922  AST 20  ALT 18  ALKPHOS 111  BILITOT 0.2*  PROT 7.4  ALBUMIN 3.8    Lab 10/16/11 0922  LIPASE 38  AMYLASE --   No results found for this basename: AMMONIA:2 in the last 168 hours CBC:  Lab 10/20/11 0500 10/19/11 0555 10/16/11 0922  WBC 6.7 6.2 --  NEUTROABS -- -- 5.1  HGB 9.8* 9.7* --  HCT 29.2* 29.5* --  MCV 93.0 93.4 --  PLT 269 262 --   CBG:  Lab 10/17/11 1021  GLUCAP 118*   Anemia Panel:  Lab 10/17/11 1407  VITAMINB12 1270*  FOLATE 6.0  FERRITIN 53  TIBC 254  IRON 60  RETICCTPCT 0.7    Micro Results: No results found for this or any previous visit (from the past 240 hour(s)). Studies/Results: Dg Ugi W/water Sol Cm  10/18/2011  *RADIOLOGY REPORT*  Clinical Data:Evaluate for  duodenal obstruction  WATER SOLUBLE UPPER GI SERIES  Technique:  Single-column upper GI series was performed using water soluble contrast.  Fluoroscopy Time: 1.32  Contrast: 125 ml Omnipaque-300 IV  Comparison: CT abdomen/pelvis dated 09/11/2011.  Findings: Scout radiograph demonstrates cholecystectomy clips and a nonobstructive bowel gas pattern.  Normal esophageal peristalsis.  Normal gastric folds.  Stable mild prominence of the duodenal bulb.  Contrast spills into the second portion of the duodenum, which is nondilated.  Although evaluation is constrained by use of water soluble contrast, dilute contrast eventually flows into distal loops of nondilated small bowel.  No evidence of small bowel obstruction.  IMPRESSION: No evidence of small bowel obstruction.  Stable mild prominence of the duodenal bulb.  Original Report Authenticated By: Charline Bills, M.D.   Medications: I have reviewed the patient's current medications. Scheduled Meds:   . enoxaparin  40 mg Subcutaneous Q24H  . feeding supplement  1 Container Oral TID WC  . pantoprazole  40 mg Oral Q1200  . DISCONTD: pantoprazole (PROTONIX) IV  40  mg Intravenous Q12H   Continuous Infusions:  PRN Meds:.morphine, promethazine, promethazine Assessment/Plan: # Duodenal ulcer with obstruction  Pt doing well, no N/V. Diet advanced yesterday in a.m. and in evening, patient tolerating well.  Will defer to GI opinion on d/c and f/u course.  May be ready for d/c today with close f/u. - appreciate GI rec's re: d/c status, exact PPI regimen for outpt, and what foods to eat after d/c. - appreciate nutrition rec's  - f/u h pylori test  - PPI IV   # Anemia  Pt p/w relatively acute anemia, likely a reflection of fluid status, but also could be related to ulcer. Anemia panel indicates some degree of Fe deficiency. Reticulocyte count appears inappropriately normal in the setting of bleeding. Stable Hb, pt menstruating currently as well. - consider Fe  supplementation  - f/u hemoccult   #PPx:  lovenox   LOS: 4 days   WILDMAN-TOBRINER, BEN 10/20/2011, 8:11 AM

## 2011-10-20 NOTE — Discharge Summary (Signed)
Internal Medicine Teaching [facility] Discharge Note  Name: [name] Barnett MRN: [id number] DOB: 01/21/1976 35 y.o.  Date of Admission: 10/16/2011  7:50 AM Date of Discharge: 10/20/2011 Attending Physician: Farley Ly, MD  Discharge Diagnosis: Active Problems: 1. Duodenal ulcer, acute with obstruction 2. Tobacco abuse 3. Normocytic anemia 4. HTN  Discharge Medications: Current Discharge Medication List    CONTINUE these medications which have CHANGED   Details  dexlansoprazole (DEXILANT) 60 MG capsule Take 1 capsule (60 mg total) by mouth 2 (two) times daily. Qty: 60 capsule, Refills: 1      CONTINUE these medications which have NOT CHANGED   Details  LISINOPRIL PO Take 20 mg by mouth daily.         Disposition and follow-up:   [name] Barnett was discharged from [facility] in Stable condition.    Follow-up Appointments: - Pt will call Dr. Madilyn Fireman office for an appointment in 1-2 weeks.  She is reliable and knows this is important follow up.  - IM OPC will be calling [name] to help her establish care with [place] and for hospital f/u.  Discharge Orders    Future Orders Please Complete By Expires   Increase activity slowly      Discharge instructions      Comments:   Please take the dexilant twice daily until your follow up appt with Dr. Madilyn Fireman.  You will need to call Dr. Madilyn Fireman Office for an appointment.  It should be within 1-2 weeks. Keep eating soft foods, no spicy foods.   Call MD for:  persistant nausea and vomiting         Consultations: Treatment Team:  Veva Holes, MD  Procedures Performed:  Dg Abd Acute W/chest  10/16/2011  *RADIOLOGY REPORT*  Clinical Data: Vomiting, left upper quadrant pain  ACUTE ABDOMEN SERIES (ABDOMEN 2 VIEW & CHEST 1 VIEW)  Comparison: CT abdomen pelvis of 09/11/2011  Findings: The lungs are clear and slightly hyperaerated. Mediastinal contours appear normal.  The heart is within normal  limits in size. No bony abnormality is seen.  Supine and erect views of the abdomen show no evidence of bowel obstruction.  No free air is seen on the erect view.  Surgical clips are present in the right upper quadrant from cholecystectomy. No opaque calculi are noted.  IMPRESSION:  1.  No active lung disease.  Slight hyperaeration. 2.  No bowel obstruction.  No free air.  Original Report Authenticated By: Juline Patch, M.D.   Dg Kayleen Memos W/water Sol Cm  10/18/2011  *RADIOLOGY REPORT*  Clinical Data:Evaluate for duodenal obstruction  WATER SOLUBLE UPPER GI SERIES  Technique:  Single-column upper GI series was performed using water soluble contrast.  Fluoroscopy Time: 1.32  Contrast: 125 ml Omnipaque-300 IV  Comparison: CT abdomen/pelvis dated 09/11/2011.  Findings: Scout radiograph demonstrates cholecystectomy clips and a nonobstructive bowel gas pattern.  Normal esophageal peristalsis.  Normal gastric folds.  Stable mild prominence of the duodenal bulb.  Contrast spills into the second portion of the duodenum, which is nondilated.  Although evaluation is constrained by use of water soluble contrast, dilute contrast eventually flows into distal loops of nondilated small bowel.  No evidence of small bowel obstruction.  IMPRESSION: No evidence of small bowel obstruction.  Stable mild prominence of the duodenal bulb.  Original Report Authenticated By: Charline Bills, M.D.    EGD on 10/17/11: Please see EPIC note for full details Showed an obstructing ulcer of  the duodenal bulb w/o evidence of active bleeding.  Admission HPI: Patient is a 66 her old female with past medical history significant for acute cholecystitis status post laparoscopic cholecystectomy on 07/28/2011. She presents today to [facility] emergency room with severe nausea and vomiting. She notes she has experienced 3 similar episodes following her recent laparoscopic cholecystectomy. She states she is unable to tolerate foods and fluids including  small sips of water. She has followed up as an outpatient for these symptoms with Dr. Dwain Sarna of [facility] surgery and underwent CT of the abdomen and pelvis earlier in October that was unrevealing for cause of her persistent nausea and vomiting. At that time, she was referred to Dr. Bosie Clos with Gastroenterology. He started her on Dexilant 60 mg daily with plan for EGD in late November. Patient notes her vomiting resolved after starting the Dexilant  however she continued to experience significant nausea. She ran out of Dexilant about one week PTA and subsequently began to experience worsening nausea and vomiting approximately 5 days prior to her arrival today. She reports a sensation of "burning" in her epigastric area. She denies any other abdominal pain. She describes her emesis is mostly fluid and denies any hematemesis. She reports 3 days of watery diarrhea that occurred on the weekend prior to admission but is now resolved. He denies any further diarrhea, loose stools, bright red blood per rectum, or dark tarry stools. She denies fevers, chills, sick contacts, recent travel, exposure to fresh water sources, animal exposure, or ingestion of raw/undercooked foods or unpasteurized dairy products.   Hospital Course by problem list: 1. Duodenal Ulcer: Patient was initially seen for intractable nausea and vomiting for 5 days. GI was consulted and proceeded directly to EGD, which found an obstructing duodenal ulcer in the duodenal bulb. They recommended a surgical consult: Surgery recommended against operating, and that a surgery would be a large operation that should be avoided if possible.  An upper GI study on 10/18/2011 showed no evidence of small bowel obstruction. Consequently, we decided to pursue conservative therapy for this patient. Her diet was slowly advanced, and she was put on aggressive PPI therapy. She responded well to this treatment and by the time of discharge was tolerating a soft  mechanical diet. She will followup in one to 2 weeks with Dr. Madilyn Fireman and [name], to decide how to proceed. H. pylori antibodies were obtained, which will be followed up, and treatment will proceed if necessary. No specimens were taken during this first EGD. She was discharged on twice a day dexilant that can be brought down to daily at her followup appointment. She was counseled on avoidance of spicy foods, small meals, liquid meals, and to return if she gets further nausea, vomiting, dark tarry stools, or blood.  2. Anemia: This patient was found to be anemic in relation to her initial hemoglobin. There might be some degree of hemoconcentration given her initial presentation of nausea and vomiting. However, anemia panel indicates a mild degree of iron deficiency.  She may benefit from iron therapy, but we decided to hold off on this until her ulcer has resolved or become less acute. The patient was also menstruating during this admission. In addition, a Hemoccult was negative x1. Her ulcer may have contributed to her anemia, but there was no evidence of bleeding on anoscopic examination.  3. Electrolyte abnormalities: The patient was found initially to have an elevated anion gap, which closed with fluid resuscitation. She was also found to have  hypokalemia, which resolved with repletion. No further action was taken during this admission.  4. AKI: Patient had an elevated creatinine on admission, likely secondary to hypovolemia in the setting of nausea and vomiting. Her creatinine normalized with fluid resuscitation.  5. Tobacco: Patient was counseled on smoking cessation, and she did not request any nicotine replacement in the hospital. She says that she will stop smoking, and that she did not want any pharmacologic assistance.   Discharge Vitals:  BP 142/83  Pulse 65  Temp(Src) 97.6 F (36.4 C) (Oral)  Resp 16  Ht 5\' 3"  (1.6 m)  Wt 132 lb (59.875 kg)  BMI 23.38 kg/m2  SpO2 98%  LMP  10/18/2011  Discharge Labs:  Results for orders placed during the hospital encounter of 10/16/11 (from the past 24 hour(s))  CBC     Status: Abnormal   Collection Time   10/20/11  5:00 AM      Component Value Range   WBC 6.7  4.0 - 10.5 (K/uL)   RBC 3.14 (*) 3.87 - 5.11 (MIL/uL)   Hemoglobin 9.8 (*) 12.0 - 15.0 (g/dL)   HCT 78.2 (*) 95.6 - 46.0 (%)   MCV 93.0  78.0 - 100.0 (fL)   MCH 31.2  26.0 - 34.0 (pg)   MCHC 33.6  30.0 - 36.0 (g/dL)   RDW 21.3  08.6 - 57.8 (%)   Platelets 269  150 - 400 (K/uL)    SignedAbner Greenspan, BEN 10/20/2011, 11:04 AM

## 2011-10-20 NOTE — Progress Notes (Signed)
Pt discharged per MD's order. Pt received all discharge information. Pt stated understanding. Pt escorted to car safley with no complaints.

## 2011-10-20 NOTE — Progress Notes (Signed)
Subjective: The patient is doing much better and wants to go home she's been eating soft solids without pain nausea or vomiting. Her back hurts from the bed but she has no other complaints and no signs of bleeding or fever chills or night sweats.  Objective: Vital signs stable afebrile patient looks well no acute distress. Abdomen soft nontender Hemoglobin stable 9.8  Assessment: Improved  Plan: Okay with me to go home we discussed diet slowly advanced and to watch out for her nausea vomiting pain or black tarry stools. She was instructed to use Tylenol only over-the-counter and no aspirin or nonsteroidals and will call when necessary. Otherwise followup at my partner Dr. Madilyn Fireman in 1-2 weeks and he will check H. pylori blood test and decide any further workup and plan. Fauna Neuner E 11:16 AM

## 2011-10-22 LAB — H. PYLORI ANTIBODY, IGG: H Pylori IgG: <0.4 {ISR}

## 2011-10-23 NOTE — ED Provider Notes (Signed)
I saw and evaluated the patient, reviewed the resident's note and I agree with the findings and plan.    Nelia Shi, MD 10/23/11 [phone]

## 2011-11-02 ENCOUNTER — Encounter (HOSPITAL_COMMUNITY): Payer: Self-pay | Admitting: Gastroenterology

## 2011-11-20 ENCOUNTER — Encounter: Payer: BC Managed Care – PPO | Admitting: Internal Medicine

## 2011-12-31 ENCOUNTER — Ambulatory Visit (HOSPITAL_COMMUNITY)
Admission: RE | Admit: 2011-12-31 | Discharge: 2011-12-31 | Disposition: A | Discharge status: Home / Self Care | Payer: BC Managed Care – PPO | Source: Ambulatory Visit | Attending: Gastroenterology | Admitting: Gastroenterology

## 2011-12-31 ENCOUNTER — Encounter (HOSPITAL_COMMUNITY): Payer: Self-pay | Admitting: *Deleted

## 2011-12-31 ENCOUNTER — Encounter (HOSPITAL_COMMUNITY)
Admission: RE | Disposition: A | Discharge status: Home / Self Care | Payer: Self-pay | Source: Ambulatory Visit | Attending: Gastroenterology

## 2011-12-31 DIAGNOSIS — K315 Obstruction of duodenum: Secondary | ICD-10-CM | POA: Insufficient documentation

## 2011-12-31 HISTORY — PX: BALLOON DILATION: SHX5330

## 2011-12-31 SURGERY — ESOPHAGOGASTRODUODENOSCOPY (EGD) WITH ESOPHAGEAL DILATION
Anesthesia: Moderate Sedation

## 2011-12-31 MED ORDER — BUTAMBEN-TETRACAINE-BENZOCAINE 2-2-14 % EX AERO
INHALATION_SPRAY | CUTANEOUS | Status: DC | PRN
  Administered 2011-12-31: 2 via TOPICAL

## 2011-12-31 MED ORDER — FENTANYL CITRATE 0.05 MG/ML IJ SOLN
INTRAMUSCULAR | Status: AC
  Filled 2011-12-31: qty 2

## 2011-12-31 MED ORDER — FENTANYL CITRATE 0.05 MG/ML IJ SOLN
INTRAMUSCULAR | Status: DC | PRN
  Administered 2011-12-31 (×4): 25 ug via INTRAVENOUS

## 2011-12-31 MED ORDER — MIDAZOLAM HCL 10 MG/2ML IJ SOLN
INTRAMUSCULAR | Status: DC | PRN
  Administered 2011-12-31: 1 mg via INTRAVENOUS
  Administered 2011-12-31 (×2): 2.5 mg via INTRAVENOUS
  Administered 2011-12-31: 2 mg via INTRAVENOUS

## 2011-12-31 MED ORDER — SODIUM CHLORIDE 0.9 % IV SOLN
Freq: Once | INTRAVENOUS | Status: AC
  Administered 2011-12-31: 500 mL via INTRAVENOUS

## 2011-12-31 MED ORDER — MIDAZOLAM HCL 10 MG/2ML IJ SOLN
INTRAMUSCULAR | Status: AC
  Filled 2011-12-31: qty 2

## 2011-12-31 NOTE — Brief Op Note (Signed)
12/31/2011  10:15 AM  PATIENT:  [name] Barnett  36 y.o. female  PRE-OPERATIVE DIAGNOSIS:  pyloric ulcer  POST-OPERATIVE DIAGNOSIS:  prloric channel dilation  PROCEDURE:  Procedure(s): ESOPHAGOGASTRODUODENOSCOPY (EGD) WITH ESOPHAGEAL DILATION BALLOON DILATION  SURGEON:  Surgeon(s): Barrie Folk, MD  Please see formal procedure note. There was a post bulbar duodenal tight stricture with retained food in the stomach. An adjustable 8-10 mm pyloric balloon dilator was inflated within the stricture. It is unclear whether this resulted in effective increase in diameter of the stricture.[name]

## 2011-12-31 NOTE — Op Note (Signed)
[facility] [address] [place], [place]  [address]  ENDOSCOPY PROCEDURE REPORT  PATIENT:  [name], Barnett  MR#:  #[id number] BIRTHDATE:  Jun 27, 1976, 35 yrs. old  GENDER:  female  ENDOSCOPIST:  Dorena Cookey Referred by:  PROCEDURE DATE:  12/31/2011 PROCEDURE: EGD with esophageal dilatation ASA CLASS: INDICATIONS: Duodenal stenosis from ulcer  MEDICATIONS: Fentanyl 100 mcg Versed 10 mg TOPICAL ANESTHETIC: Cetacaine  DESCRIPTION OF PROCEDURE:   After the risks benefits and alternatives of the procedure were thoroughly explained, informed consent was obtained.  The EG-[id number] ([id number]) and [id number] ([id number]) endoscope was introduced through the mouth and advanced to the , without limitations.  The instrument was slowly withdrawn as the mucosa was fully examined. <<PROCEDUREIMAGES>>  FINDINGS:  Residual food in stomach, narrow stenotic stricture at the junction of the duodenal bulb and second portion of the duodenum, could not traverse with the endoscope. In 8-10 mm pyloric channel balloon dilator inflated within the stricture. 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  RECOMMENDATIONS: Observe response to today's dilatation in continued medical management. If continues to vomit will need surgery.  REPEAT EXAM:  ______________________________ Dorena Cookey  CC:  n. eSIGNED:   Dorena Cookey at 12/31/2011 10:14 AM  Margaretmary Lombard, #[phone]

## 2012-01-01 ENCOUNTER — Encounter (HOSPITAL_COMMUNITY): Payer: Self-pay | Admitting: Gastroenterology

## 2012-01-20 ENCOUNTER — Other Ambulatory Visit: Payer: Self-pay

## 2012-01-20 ENCOUNTER — Encounter (HOSPITAL_COMMUNITY): Payer: Self-pay | Admitting: Emergency Medicine

## 2012-01-20 ENCOUNTER — Inpatient Hospital Stay (HOSPITAL_COMMUNITY)
Admission: EM | Admit: 2012-01-20 | Discharge: 2012-01-30 | DRG: 154 | Disposition: A | Discharge status: Home / Self Care | Payer: BC Managed Care – PPO | Attending: General Surgery | Admitting: General Surgery

## 2012-01-20 ENCOUNTER — Emergency Department (HOSPITAL_COMMUNITY): Payer: BC Managed Care – PPO

## 2012-01-20 DIAGNOSIS — I4891 Unspecified atrial fibrillation: Secondary | ICD-10-CM | POA: Diagnosis present

## 2012-01-20 DIAGNOSIS — K315 Obstruction of duodenum: Secondary | ICD-10-CM | POA: Diagnosis present

## 2012-01-20 DIAGNOSIS — R112 Nausea with vomiting, unspecified: Secondary | ICD-10-CM | POA: Diagnosis present

## 2012-01-20 DIAGNOSIS — Z9089 Acquired absence of other organs: Secondary | ICD-10-CM

## 2012-01-20 DIAGNOSIS — K311 Adult hypertrophic pyloric stenosis: Secondary | ICD-10-CM | POA: Diagnosis present

## 2012-01-20 DIAGNOSIS — E871 Hypo-osmolality and hyponatremia: Secondary | ICD-10-CM | POA: Diagnosis present

## 2012-01-20 DIAGNOSIS — K263 Acute duodenal ulcer without hemorrhage or perforation: Secondary | ICD-10-CM | POA: Diagnosis present

## 2012-01-20 DIAGNOSIS — R1013 Epigastric pain: Secondary | ICD-10-CM | POA: Diagnosis present

## 2012-01-20 DIAGNOSIS — Z720 Tobacco use: Secondary | ICD-10-CM | POA: Diagnosis present

## 2012-01-20 DIAGNOSIS — M549 Dorsalgia, unspecified: Secondary | ICD-10-CM | POA: Diagnosis not present

## 2012-01-20 DIAGNOSIS — F17200 Nicotine dependence, unspecified, uncomplicated: Secondary | ICD-10-CM | POA: Diagnosis present

## 2012-01-20 DIAGNOSIS — K269 Duodenal ulcer, unspecified as acute or chronic, without hemorrhage or perforation: Principal | ICD-10-CM | POA: Diagnosis present

## 2012-01-20 DIAGNOSIS — E46 Unspecified protein-calorie malnutrition: Secondary | ICD-10-CM | POA: Diagnosis present

## 2012-01-20 DIAGNOSIS — D649 Anemia, unspecified: Secondary | ICD-10-CM | POA: Diagnosis present

## 2012-01-20 DIAGNOSIS — E876 Hypokalemia: Secondary | ICD-10-CM | POA: Diagnosis present

## 2012-01-20 DIAGNOSIS — I10 Essential (primary) hypertension: Secondary | ICD-10-CM | POA: Diagnosis present

## 2012-01-20 DIAGNOSIS — K560 Paralytic ileus: Secondary | ICD-10-CM | POA: Diagnosis not present

## 2012-01-20 DIAGNOSIS — K929 Disease of digestive system, unspecified: Secondary | ICD-10-CM | POA: Diagnosis not present

## 2012-01-20 HISTORY — DX: Obstruction of duodenum: K31.5

## 2012-01-20 LAB — CBC
Hemoglobin: 12.9 g/dL (ref 12.0–15.0)
MCH: 30.7 pg (ref 26.0–34.0)
MCHC: 32.8 g/dL (ref 30.0–36.0)
RDW: 14.2 % (ref 11.5–15.5)

## 2012-01-20 LAB — URINALYSIS, ROUTINE W REFLEX MICROSCOPIC
Glucose, UA: NEGATIVE mg/dL
Ketones, ur: 15 mg/dL — AB
Leukocytes, UA: NEGATIVE
Protein, ur: 30 mg/dL — AB
Urobilinogen, UA: 1 mg/dL (ref 0.0–1.0)

## 2012-01-20 LAB — DIFFERENTIAL
Basophils Absolute: 0 10*3/uL (ref 0.0–0.1)
Basophils Relative: 0 % (ref 0–1)
Eosinophils Absolute: 0.1 10*3/uL (ref 0.0–0.7)
Monocytes Absolute: 1 10*3/uL (ref 0.1–1.0)
Monocytes Relative: 9 % (ref 3–12)
Neutrophils Relative %: 64 % (ref 43–77)

## 2012-01-20 LAB — LACTIC ACID, PLASMA: Lactic Acid, Venous: 0.8 mmol/L (ref 0.5–2.2)

## 2012-01-20 LAB — POCT PREGNANCY, URINE: Preg Test, Ur: NEGATIVE

## 2012-01-20 LAB — COMPREHENSIVE METABOLIC PANEL
AST: 10 U/L (ref 0–37)
Albumin: 3.7 g/dL (ref 3.5–5.2)
BUN: 18 mg/dL (ref 6–23)
Creatinine, Ser: 1.05 mg/dL (ref 0.50–1.10)
Potassium: 2.9 mEq/L — ABNORMAL LOW (ref 3.5–5.1)
Total Protein: 7.3 g/dL (ref 6.0–8.3)

## 2012-01-20 LAB — LIPASE, BLOOD: Lipase: 30 U/L (ref 11–59)

## 2012-01-20 MED ORDER — MORPHINE SULFATE 4 MG/ML IJ SOLN
4.0000 mg | Freq: Once | INTRAMUSCULAR | Status: AC
  Administered 2012-01-20: 4 mg via INTRAVENOUS
  Filled 2012-01-20: qty 1

## 2012-01-20 MED ORDER — ONDANSETRON HCL 4 MG/2ML IJ SOLN: 4.0000 mg | Freq: Four times a day (QID) | INTRAMUSCULAR | Status: DC | PRN

## 2012-01-20 MED ORDER — SODIUM CHLORIDE 0.9 % IV BOLUS (SEPSIS)
1000.0000 mL | Freq: Once | INTRAVENOUS | Status: AC
  Administered 2012-01-20: 1000 mL via INTRAVENOUS

## 2012-01-20 MED ORDER — ONDANSETRON HCL 4 MG/2ML IJ SOLN
4.0000 mg | Freq: Once | INTRAMUSCULAR | Status: AC
  Administered 2012-01-20: 4 mg via INTRAVENOUS
  Filled 2012-01-20: qty 2

## 2012-01-20 MED ORDER — ONDANSETRON HCL 4 MG PO TABS: 4.0000 mg | ORAL_TABLET | Freq: Four times a day (QID) | ORAL | Status: DC | PRN

## 2012-01-20 MED ORDER — MORPHINE SULFATE 4 MG/ML IJ SOLN: 4.0000 mg | Freq: Once | INTRAMUSCULAR | Status: DC

## 2012-01-20 MED ORDER — HYDROMORPHONE HCL PF 1 MG/ML IJ SOLN
0.5000 mg | INTRAMUSCULAR | Status: DC | PRN
  Administered 2012-01-20 – 2012-01-21 (×2): 1 mg via INTRAVENOUS
  Filled 2012-01-20 (×2): qty 1

## 2012-01-20 MED ORDER — LORAZEPAM 2 MG/ML IJ SOLN
1.0000 mg | Freq: Every evening | INTRAMUSCULAR | Status: DC | PRN
  Administered 2012-01-20 – 2012-01-23 (×2): 1 mg via INTRAVENOUS
  Filled 2012-01-20 (×2): qty 1

## 2012-01-20 MED ORDER — SODIUM CHLORIDE 0.9 % IV SOLN
INTRAVENOUS | Status: DC
  Administered 2012-01-20 – 2012-01-23 (×5): via INTRAVENOUS
  Filled 2012-01-20 (×7): qty 1000

## 2012-01-20 MED ORDER — ONDANSETRON HCL 4 MG/2ML IJ SOLN: 4.0000 mg | Freq: Three times a day (TID) | INTRAMUSCULAR | Status: DC | PRN

## 2012-01-20 MED ORDER — ONDANSETRON HCL 4 MG/2ML IJ SOLN: 4.0000 mg | Freq: Once | INTRAMUSCULAR | Status: DC

## 2012-01-20 MED ORDER — FAMOTIDINE IN NACL 20-0.9 MG/50ML-% IV SOLN
20.0000 mg | Freq: Once | INTRAVENOUS | Status: AC
  Administered 2012-01-20: 20 mg via INTRAVENOUS
  Filled 2012-01-20: qty 50

## 2012-01-20 MED ORDER — SODIUM CHLORIDE 0.9 % IV SOLN
8.0000 mg/h | INTRAVENOUS | Status: DC
  Administered 2012-01-20 – 2012-01-28 (×12): 8 mg/h via INTRAVENOUS
  Filled 2012-01-20 (×32): qty 80

## 2012-01-20 MED ORDER — POTASSIUM CHLORIDE 10 MEQ/100ML IV SOLN
10.0000 meq | INTRAVENOUS | Status: AC
  Administered 2012-01-20 (×3): 10 meq via INTRAVENOUS
  Filled 2012-01-20 (×3): qty 100

## 2012-01-20 MED ORDER — DEXTROSE-NACL 5-0.45 % IV SOLN: INTRAVENOUS | Status: DC

## 2012-01-20 MED ORDER — SODIUM CHLORIDE 0.9 % IV SOLN
80.0000 mg | Freq: Once | INTRAVENOUS | Status: DC
  Filled 2012-01-20: qty 80

## 2012-01-20 NOTE — H&P (Addendum)
PCP:  None, unassigned, did not yet followup with [facility] after recent admission   Gastroenterologist: Dr. Dorena Cookey  Chief Complaint:  Epigastric pain, nausea and vomiting  HPI: [name] Barnett is a 36 year old white female with history of duodenal ulcer with obstruction which was diagnosed back in November after an EGD, she was then prescribed Dexilant twice a day, did well for a while and then had recurrence of her symptoms in late January, saw Dr. Madilyn Fireman underwent an EGD and dilation on January 21 during this time also stopped taking her Dexilant since she could not afford the co-pay. Today she presents to the ER with epigastric pain radiating to the back, intermittent nausea and vomiting since Tuesday, poor by mouth intake and weakness. She denies using any NSAIDs, alcohol. She denies hematemesis or melena. KUB done in the ER which was unremarkable, had abnormal electrolytes and triad the hospitalist were consulted for further evaluation and management.  Allergies:   Allergies  Allergen Reactions  . Nsaids     Pt diagnosed with near-obstructing circumferential ulcer of duodenum [id number]      Past Medical History  Diagnosis Date  . Hypertension   . GERD (gastroesophageal reflux disease)   . Duodenal obstruction     Past Surgical History  Procedure Date  . Cholecystectomy 07/28/11  . Temporomandibular joint surgery     2 surgeries  . Esophagogastroduodenoscopy 10/17/2011    Procedure: ESOPHAGOGASTRODUODENOSCOPY (EGD);  Surgeon: Barrie Folk, MD;  Location: [facility] ENDOSCOPY;  Service: Endoscopy;  Laterality: N/A;  . Balloon dilation 12/31/2011    Procedure: BALLOON DILATION;  Surgeon: Barrie Folk, MD;  Location: [facility] ENDOSCOPY;  Service: Endoscopy;  Laterality: N/A;    Prior to Admission medications   Medication Sig Start Date End Date Taking? Authorizing Provider  lisinopril (PRINIVIL,ZESTRIL) 20 MG tablet Take 20 mg by mouth daily.   Yes Historical Provider, MD     Social History: Married, lives at home with her husband currently employed. Smokes one pack per day for approximately 20 years Denies alcohol abuse or illicit drug use   Family History  Problem Relation Age of Onset  . Malignant hyperthermia Neg Hx     Review of Systems:  Constitutional: Denies fever, chills, diaphoresis, appetite change and fatigue.  HEENT: Denies photophobia, eye pain, redness, hearing loss, ear pain, congestion, sore throat, rhinorrhea, sneezing, mouth sores, trouble swallowing, neck pain, neck stiffness and tinnitus.   Respiratory: Denies SOB, DOE, cough, chest tightness,  and wheezing.   Cardiovascular: Denies chest pain, palpitations and leg swelling.   Genitourinary: Denies dysuria, urgency, frequency, hematuria, flank pain and difficulty urinating.  Musculoskeletal: Denies myalgias, back pain, joint swelling, arthralgias and gait problem.  Skin: Denies pallor, rash and wound.  Neurological: Denies dizziness, seizures, syncope, weakness, light-headedness, numbness and headaches.  Hematological: Denies adenopathy. Easy bruising, personal or family bleeding history  Psychiatric/Behavioral: Denies suicidal ideation, mood changes, confusion, nervousness, sleep disturbance and agitation   Physical Exam: Blood pressure 120/73, pulse 92, temperature 98 F (36.7 C), temperature source Oral, resp. rate 18, last menstrual period 12/21/2011, SpO2 100.00%. Gen. exams are currently built white female laying in stretcher in no acute distress HEENT: Pupils equal reactive to light oral mucosa is dry poor dental hygiene CVS S1-S2 regular rate rhythm no murmurs rubs or gallops Lungs are clear to auscultation bilaterally Abdomen is soft there's epigastric and periumbilical tenderness no rigidity or rebound negative Murphy's no flank tenderness, positive bowel sounds no organomegaly Extremities no edema  clubbing or cyanosis Neuro muscle extremities no localizing  signs  Labs on Admission:  Results for orders placed during the hospital encounter of 01/20/12 (from the past 48 hour(s))  URINALYSIS, ROUTINE W REFLEX MICROSCOPIC     Status: Abnormal   Collection Time   01/20/12  4:40 PM      Component Value Range Comment   Color, Urine YELLOW  YELLOW     APPearance CLEAR  CLEAR     Specific Gravity, Urine 1.021  1.005 - 1.030     pH 8.5 (*) 5.0 - 8.0     Glucose, UA NEGATIVE  NEGATIVE (mg/dL)    Hgb urine dipstick NEGATIVE  NEGATIVE     Bilirubin Urine NEGATIVE  NEGATIVE     Ketones, ur 15 (*) NEGATIVE (mg/dL)    Protein, ur 30 (*) NEGATIVE (mg/dL)    Urobilinogen, UA 1.0  0.0 - 1.0 (mg/dL)    Nitrite NEGATIVE  NEGATIVE     Leukocytes, UA NEGATIVE  NEGATIVE    URINE MICROSCOPIC-ADD ON     Status: Abnormal   Collection Time   01/20/12  4:40 PM      Component Value Range Comment   Squamous Epithelial / LPF MANY (*) RARE     Urine-Other AMORPHOUS URATES/PHOSPHATES     POCT PREGNANCY, URINE     Status: Normal   Collection Time   01/20/12  4:52 PM      Component Value Range Comment   Preg Test, Ur NEGATIVE  NEGATIVE    CBC     Status: Abnormal   Collection Time   01/20/12  5:05 PM      Component Value Range Comment   WBC 11.9 (*) 4.0 - 10.5 (K/uL)    RBC 4.20  3.87 - 5.11 (MIL/uL)    Hemoglobin 12.9  12.0 - 15.0 (g/dL)    HCT 16.1  09.6 - 04.5 (%)    MCV 93.6  78.0 - 100.0 (fL)    MCH 30.7  26.0 - 34.0 (pg)    MCHC 32.8  30.0 - 36.0 (g/dL)    RDW 40.9  81.1 - 91.4 (%)    Platelets 473 (*) 150 - 400 (K/uL)   DIFFERENTIAL     Status: Normal   Collection Time   01/20/12  5:05 PM      Component Value Range Comment   Neutrophils Relative 64  43 - 77 (%)    Neutro Abs 7.6  1.7 - 7.7 (K/uL)    Lymphocytes Relative 27  12 - 46 (%)    Lymphs Abs 3.2  0.7 - 4.0 (K/uL)    Monocytes Relative 9  3 - 12 (%)    Monocytes Absolute 1.0  0.1 - 1.0 (K/uL)    Eosinophils Relative 1  0 - 5 (%)    Eosinophils Absolute 0.1  0.0 - 0.7 (K/uL)    Basophils  Relative 0  0 - 1 (%)    Basophils Absolute 0.0  0.0 - 0.1 (K/uL)   COMPREHENSIVE METABOLIC PANEL     Status: Abnormal   Collection Time   01/20/12  5:05 PM      Component Value Range Comment   Sodium 132 (*) 135 - 145 (mEq/L)    Potassium 2.9 (*) 3.5 - 5.1 (mEq/L)    Chloride 90 (*) 96 - 112 (mEq/L)    CO2 34 (*) 19 - 32 (mEq/L)    Glucose, Bld 101 (*) 70 - 99 (mg/dL)    BUN 18  6 - 23 (mg/dL)    Creatinine, Ser 4.09  0.50 - 1.10 (mg/dL)    Calcium 9.9  8.4 - 10.5 (mg/dL)    Total Protein 7.3  6.0 - 8.3 (g/dL)    Albumin 3.7  3.5 - 5.2 (g/dL)    AST 10  0 - 37 (U/L)    ALT <5  0 - 35 (U/L)    Alkaline Phosphatase 88  39 - 117 (U/L)    Total Bilirubin 0.2 (*) 0.3 - 1.2 (mg/dL)    GFR calc non Af Amer 67 (*) >90 (mL/min)    GFR calc Af Amer 78 (*) >90 (mL/min)   LIPASE, BLOOD     Status: Normal   Collection Time   01/20/12  5:05 PM      Component Value Range Comment   Lipase 30  11 - 59 (U/L)   LACTIC ACID, PLASMA     Status: Normal   Collection Time   01/20/12  5:06 PM      Component Value Range Comment   Lactic Acid, Venous 0.8  0.5 - 2.2 (mmol/L)     Radiological Exams on Admission: Dg Abd Acute W/chest  01/20/2012  *RADIOLOGY REPORT*  Clinical Data: Abdominal pain  ACUTE ABDOMEN SERIES (ABDOMEN 2 VIEW & CHEST 1 VIEW)  Comparison: 10/15/2009  Findings: The  cardiomediastinal silhouette is stable.  No acute infiltrate or pleural effusion.  No pulmonary edema.  Mild hyperinflation again noted.  Post cholecystectomy surgical clips are noted.  There is nonspecific nonobstructive bowel gas pattern. Moderate colonic stool.  IMPRESSION: No acute disease.  Post cholecystectomy surgical clips are noted. Nonobstructive bowel gas pattern.  Moderate colonic stool.  Original Report Authenticated By: Natasha Mead, M.D.    Assessment/Plan 1. nausea vomiting and epigastric abdominal pain: suspect secondary to recurrence of duodenal ulcer with possible obstruction. Will admit to regular medical  floor start IV fluids with potassium supplementation IV Protonix drip KUB does not show evidence of free air. I have consulted Dr. Randa Evens with [name] Will also need case management assistance with regards to medication affordability The other question is whether she would require surgical repair which we will defer to GI expertise. Supportive care with IV narcotics and anti-emetics 2. Hypokalemia: Secondary to vomiting, replace 3. hyponatremia secondary to dehydration: Rehydrate with normal saline and follow trend. 4. tobacco abuse counseled 5. DVT prophylaxis: SCDs  Time Spent on Admission:  Jacey Pelc [facility] Pager: [phone] 01/20/2012, 8:19 PM

## 2012-01-20 NOTE — ED Provider Notes (Addendum)
History     CSN: [id number]  Arrival date & time 01/20/12  1613   First MD Initiated Contact with Patient 01/20/12 1657      Chief Complaint  Patient presents with  . Abdominal Pain    (Consider location/radiation/quality/duration/timing/severity/associated sxs/prior treatment) HPI Patient is a 7 row female with history of hypertension and duodenal ulcer who presents today complaining of 10 out of 10 epigastric abdominal pain. Patient says this is single and on since late December. Patient did have a recent duodenal dilation by Dr. Madilyn Fireman from [facility] gastroenterology. She had improvement in her symptoms for about a week but then had return of these. She's been taking over-the-counter medications for her symptoms. Patient has had nausea and vomiting and reports that she hasn't been able to keep anything down for at least 24 hours. She denies any blood in her stools or hematemesis. She says while she can initially swallow fluids that they will come back up within 30 minutes. She denies any fevers. She denies bowel movement but reports she's been unable to eat. Pain is worse with ingestion of anything and vomiting as well as palpation. Nothing makes her pain better.There are no other associated or modifying factors.  Past Medical History  Diagnosis Date  . Hypertension   . GERD (gastroesophageal reflux disease)     Past Surgical History  Procedure Date  . Cholecystectomy 07/28/11  . Temporomandibular joint surgery     2 surgeries  . Esophagogastroduodenoscopy 10/17/2011    Procedure: ESOPHAGOGASTRODUODENOSCOPY (EGD);  Surgeon: Barrie Folk, MD;  Location: [facility] ENDOSCOPY;  Service: Endoscopy;  Laterality: N/A;  . Balloon dilation 12/31/2011    Procedure: BALLOON DILATION;  Surgeon: Barrie Folk, MD;  Location: [facility] ENDOSCOPY;  Service: Endoscopy;  Laterality: N/A;    Family History  Problem Relation Age of Onset  . Malignant hyperthermia Neg Hx     History  Substance Use Topics  .  Smoking status: Former Smoker -- 0.5 packs/day    Types: Cigarettes    Quit date: 10/17/2011  . Smokeless tobacco: Never Used  . Alcohol Use: No    OB History    Grav Para Term Preterm Abortions TAB SAB Ect Mult Living                  Review of Systems  Constitutional: Positive for appetite change.  HENT: Negative.   Eyes: Negative.   Respiratory: Negative.   Cardiovascular: Negative.   Gastrointestinal: Positive for nausea, vomiting and abdominal pain.  Genitourinary: Negative.   Musculoskeletal: Negative.   Skin: Negative.   Neurological: Negative.   Hematological: Negative.   Psychiatric/Behavioral: Negative.   All other systems reviewed and are negative.    Allergies  Nsaids  Home Medications   Current Outpatient Rx  Name Route Sig Dispense Refill  . LISINOPRIL 20 MG PO TABS Oral Take 20 mg by mouth daily.      BP 120/73  Pulse 92  Temp(Src) 98 F (36.7 C) (Oral)  Resp 18  SpO2 100%  LMP 12/21/2011  Physical Exam  Nursing note and vitals reviewed. GEN: Well-developed, well-nourished female in mod distress HEENT: Atraumatic, normocephalic. Oropharynx clear without erythema EYES: PERRLA BL, no scleral icterus. NECK: Trachea midline, no meningismus CV: regular rate and rhythm. No murmurs, rubs, or gallops PULM: No respiratory distress.  No crackles, wheezes, or rales. GI: soft, very tender to palpation in the epigastrium. Positive bowel sounds noted. Patient does not have an acute abdomen. She does have guarding  but no rebound.  Neuro: cranial nerves 2-12 intact, no abnormalities of strength or sensation, A and O x 3 MSK: Patient moves all 4 extremities symmetrically, no deformity, edema, or injury noted Psych: no abnormality of mood   ED Course  Procedures (including critical care time)  Labs Reviewed  URINALYSIS, ROUTINE W REFLEX MICROSCOPIC - Abnormal; Notable for the following:    pH 8.5 (*)    Ketones, ur 15 (*)    Protein, ur 30 (*)    All  other components within normal limits  CBC - Abnormal; Notable for the following:    WBC 11.9 (*)    Platelets 473 (*)    All other components within normal limits  COMPREHENSIVE METABOLIC PANEL - Abnormal; Notable for the following:    Sodium 132 (*)    Potassium 2.9 (*)    Chloride 90 (*)    CO2 34 (*)    Glucose, Bld 101 (*)    Total Bilirubin 0.2 (*)    GFR calc non Af Amer 67 (*)    GFR calc Af Amer 78 (*)    All other components within normal limits  URINE MICROSCOPIC-ADD ON - Abnormal; Notable for the following:    Squamous Epithelial / LPF MANY (*)    All other components within normal limits  POCT PREGNANCY, URINE  DIFFERENTIAL  LIPASE, BLOOD  LACTIC ACID, PLASMA   Dg Abd Acute W/chest  01/20/2012  *RADIOLOGY REPORT*  Clinical Data: Abdominal pain  ACUTE ABDOMEN SERIES (ABDOMEN 2 VIEW & CHEST 1 VIEW)  Comparison: 10/15/2009  Findings: The  cardiomediastinal silhouette is stable.  No acute infiltrate or pleural effusion.  No pulmonary edema.  Mild hyperinflation again noted.  Post cholecystectomy surgical clips are noted.  There is nonspecific nonobstructive bowel gas pattern. Moderate colonic stool.  IMPRESSION: No acute disease.  Post cholecystectomy surgical clips are noted. Nonobstructive bowel gas pattern.  Moderate colonic stool.  Original Report Authenticated By: Natasha Mead, M.D.    Date: 01/20/2012  Rate: 81  Rhythm: normal sinus rhythm  QRS Axis: normal  Intervals: normal  ST/T Wave abnormalities: nonspecific T wave changes  Conduction Disutrbances:none  Narrative Interpretation:   Old EKG Reviewed: unchanged    1. Abdominal pain   2. Hypokalemia       MDM   Patient was evaluated by myself. Based on her workup she did have labs ordered for abdominal pain. Patient did have elevation of her creatinine to 1.05 from previous value of 0.6. She was also hypokalemic to 2.9. She did not have any change in her intervals on EKG which was performed.  Creatinine was  increased from previous visit. Liver panel and lipase were unremarkable. Urinalysis was remarkable for ketones. Acute abdominal series showed no signs of perforation. Patient was treated with morphine, Zofran, and Pepcid. She was also given IV fluids. Given her symptoms she did not have CAT scan ordered initially. She did have IV potassium ordered to replace this deficiency. Patient will be admitted for further management.  IV protonix was also given.  Internal medicine teaching service was paged date had a patient last here       Cyndra Numbers, MD 01/20/12 9811  Cyndra Numbers, MD 01/20/12 9147

## 2012-01-20 NOTE — ED Notes (Addendum)
C/o abd pain that radiates to back since 1:30pm yesterday.  Also reports nausea and vomiting.  Pt states she had a balloon dilation on 1/21 by Dr. Madilyn Fireman.

## 2012-01-20 NOTE — ED Notes (Signed)
Patient c/o pain to IV site.  No swelling noted.  Stated "it doesn't feel like it is in there right"  Will check after MD complete

## 2012-01-21 DIAGNOSIS — K311 Adult hypertrophic pyloric stenosis: Secondary | ICD-10-CM

## 2012-01-21 DIAGNOSIS — K279 Peptic ulcer, site unspecified, unspecified as acute or chronic, without hemorrhage or perforation: Secondary | ICD-10-CM

## 2012-01-21 DIAGNOSIS — E876 Hypokalemia: Secondary | ICD-10-CM | POA: Diagnosis present

## 2012-01-21 MED ORDER — POTASSIUM CHLORIDE 10 MEQ/100ML IV SOLN
10.0000 meq | Freq: Once | INTRAVENOUS | Status: AC
  Administered 2012-01-21: 10 meq via INTRAVENOUS
  Filled 2012-01-21: qty 100

## 2012-01-21 NOTE — Progress Notes (Signed)
Patient ID: [name] Barnett, female   DOB: [birth year], 36 y.o.   MRN: [id number]  Assessment/Plan:  Principal Problem:   *Duodenal ulcer, acute with obstruction - GI and surgery are following - will follow up on recommendations - clear liquid for now - PPI and antiemetics  Active Problems:   Epigastric pain - secondary to duodenal ulcer   Nausea & vomiting - patient reports no vomiting since admission - continue zofran as needed   Hypokalemia - secondary to GI losses - repleted - check BMP in am   EDUCATION - test results and diagnostic studies were discussed with patient  at the bedside - patient has verbalized the understanding - questions were answered at the bedside and contact information was provided for additional questions or concerns   Subjective:  No events overnight. Patient denies chest pain, shortness of breath, reports some abdominal pain.   Objective:  Vital signs in last 24 hours:  Filed Vitals:   01/20/12 2204 01/21/12 0010 01/21/12 0500 01/21/12 1335  BP: 107/73  84/53 122/79  Pulse: 69  72 79  Temp: 98 F (36.7 C)  98 F (36.7 C) 97.3 F (36.3 C)  TempSrc: Oral  Oral Axillary  Resp: 18  18 18   Height:  5\' 3"  (1.6 m)    Weight:  52.4 kg (115 lb 8.3 oz)    SpO2: 98%  94% 100%    Intake/Output from previous day:   Intake/Output Summary (Last 24 hours) at 01/21/12 2041 Last data filed at 01/21/12 1855  Gross per 24 hour  Intake 2144.58 ml  Output    500 ml  Net 1644.58 ml    Physical Exam: General: Alert, awake, oriented x3, in no acute distress. HEENT: No bruits, no goiter. Moist mucous membranes, no scleral icterus, no conjunctival pallor. Heart: Regular rate and rhythm, S1/S2 +, no murmurs, rubs, gallops. Lungs: Clear to auscultation bilaterally. No wheezing, no rhonchi, no rales.  Abdomen: Soft, nontender, nondistended, positive bowel sounds. Extremities: No clubbing or cyanosis, no pitting edema,  positive pedal pulses. Neuro:  Grossly nonfocal.  Lab Results:  Basic Metabolic Panel:    Component Value Date/Time   NA 132* 01/20/2012 1705   K 2.9* 01/20/2012 1705   CL 90* 01/20/2012 1705   CO2 34* 01/20/2012 1705   BUN 18 01/20/2012 1705   CREATININE 1.05 01/20/2012 1705   CREATININE 1.18* 09/12/2011 1533   GLUCOSE 101* 01/20/2012 1705   CALCIUM 9.9 01/20/2012 1705   CBC:    Component Value Date/Time   WBC 11.9* 01/20/2012 1705   HGB 12.9 01/20/2012 1705   HCT 39.3 01/20/2012 1705   PLT 473* 01/20/2012 1705   MCV 93.6 01/20/2012 1705   NEUTROABS 7.6 01/20/2012 1705   LYMPHSABS 3.2 01/20/2012 1705   MONOABS 1.0 01/20/2012 1705   EOSABS 0.1 01/20/2012 1705   BASOSABS 0.0 01/20/2012 1705      Lab 01/20/12 1705  WBC 11.9*  HGB 12.9  HCT 39.3  PLT 473*  MCV 93.6  MCH 30.7  MCHC 32.8  RDW 14.2  LYMPHSABS 3.2  MONOABS 1.0  EOSABS 0.1  BASOSABS 0.0  BANDABS --    Lab 01/20/12 1705  NA 132*  K 2.9*  CL 90*  CO2 34*  GLUCOSE 101*  BUN 18  CREATININE 1.05  CALCIUM 9.9  MG --   No results found for this basename: INR:5,PROTIME:5 in the last 168 hours Cardiac markers: No results found for this basename: CK:3,CKMB:3,TROPONINI:3,MYOGLOBIN:3 in the last 168 hours  No components found with this basename: POCBNP:3 No results found for this or any previous visit (from the past 240 hour(s)).  Studies/Results: Dg Abd Acute W/chest  01/20/2012  *RADIOLOGY REPORT*  Clinical Data: Abdominal pain  ACUTE ABDOMEN SERIES (ABDOMEN 2 VIEW & CHEST 1 VIEW)  Comparison: 10/15/2009  Findings: The  cardiomediastinal silhouette is stable.  No acute infiltrate or pleural effusion.  No pulmonary edema.  Mild hyperinflation again noted.  Post cholecystectomy surgical clips are noted.  There is nonspecific nonobstructive bowel gas pattern. Moderate colonic stool.  IMPRESSION: No acute disease.  Post cholecystectomy surgical clips are noted. Nonobstructive bowel gas pattern.  Moderate colonic stool.  Original Report Authenticated By:  Natasha Mead, M.D.    Medications: Scheduled Meds:   . potassium chloride  10 mEq Intravenous Q1 Hr x 4  . potassium chloride  10 mEq Intravenous Once  . DISCONTD: dextrose 5 % and 0.45% NaCl   Intravenous STAT  . DISCONTD:  morphine injection  4 mg Intravenous Once  . DISCONTD: ondansetron  4 mg Intravenous Once  . DISCONTD: pantoprazole (PROTONIX) IV  80 mg Intravenous Once   Continuous Infusions:   . pantoprozole (PROTONIX) infusion 8 mg/hr (01/21/12 1206)  . sodium chloride 0.9 % 1,000 mL with potassium chloride 20 mEq infusion 75 mL/hr at 01/21/12 1129   PRN Meds:.HYDROmorphone, LORazepam, ondansetron (ZOFRAN) IV, ondansetron, DISCONTD: ondansetron (ZOFRAN) IV   LOS: 1 day   Leshon Armistead 01/21/2012, 8:41 PM  [facility] Pager: [phone]

## 2012-01-21 NOTE — Consult Note (Signed)
[facility] Consultation Note  Referring Provider: Dr. Manson Passey Primary Gastroenterologist:  Dr. Dorena Cookey  Reason for Consultation:  Nausea, vomiting, post-bulbar stricture  HPI: [name] Barnett is a 36 y.o. female admitted for nausea, vomiting, failure-to-thrive and weakness.  Has history of heavy NSAID (Goody's and ibuprofen) use for headaches and right-sided abdominal pain (ultimately diagnosed as gallbladder disease requiring cholecystectomy).  She was otherwise in static state of GI health when she experienced right-sided abdominal pain in mid August.  Ultimately diagnosed with acute cholecystitis and underwent cholecystectomy.  Had persistent nausea/vomiting post-operatively and had variety of empiric outpatient medication trials (e.g., Dexilant) which failed.   Had endoscopy in early November showing duodenal ulcer with post-bulbar stricture.  In November, she stopped all NSAIDs and was placed on PPI.  Since that time, she has had periodic troubles with post-prandial vomiting, especially worse over the past several weeks.  For this reason, she had repeat endoscopy by Dr. Madilyn Fireman about three weeks ago, which showed persistent post-bulbar stenosis, which was dilated serially from 8-->10 mm with TTS balloon dilation catheter.  Symptoms improved for about one week post-dilatation, but for the past couple weeks, she has essentially been unable to tolerate much re: diet at this, and has periodic vomiting.  Has lost over 15 lbs in the past few weeks.  Symptoms are compounded by periodic burning epigastric discomfort.  No obvious hematemesis, melena, or hematochezia.   Past Medical History  Diagnosis Date  . Hypertension   . GERD (gastroesophageal reflux disease)   . Duodenal obstruction     Past Surgical History  Procedure Date  . Cholecystectomy 07/28/11  . Temporomandibular joint surgery     2 surgeries  . Esophagogastroduodenoscopy 10/17/2011    Procedure:  ESOPHAGOGASTRODUODENOSCOPY (EGD);  Surgeon: Barrie Folk, MD;  Location: [facility] ENDOSCOPY;  Service: Endoscopy;  Laterality: N/A;  . Balloon dilation 12/31/2011    Procedure: BALLOON DILATION;  Surgeon: Barrie Folk, MD;  Location: [facility] ENDOSCOPY;  Service: Endoscopy;  Laterality: N/A;    Prior to Admission medications   Medication Sig Start Date End Date Taking? Authorizing Provider  lisinopril (PRINIVIL,ZESTRIL) 20 MG tablet Take 20 mg by mouth daily.   Yes Historical Provider, MD    Current Facility-Administered Medications  Medication Dose Route Frequency Provider Last Rate Last Dose  . famotidine (PEPCID) IVPB 20 mg  20 mg Intravenous Once Cyndra Numbers, MD   20 mg at 01/20/12 1755  . HYDROmorphone (DILAUDID) injection 0.5-1 mg  0.5-1 mg Intravenous Q4H PRN Zannie Cove, MD   1 mg at 01/20/12 2340  . LORazepam (ATIVAN) injection 1 mg  1 mg Intravenous QHS PRN,MR X 1 Mary A. Burnadette Peter, NP   1 mg at 01/20/12 2339  . morphine 4 MG/ML injection 4 mg  4 mg Intravenous Once Cyndra Numbers, MD   4 mg at 01/20/12 1755  . ondansetron (ZOFRAN) injection 4 mg  4 mg Intravenous Once Cyndra Numbers, MD   4 mg at 01/20/12 1756  . ondansetron (ZOFRAN) tablet 4 mg  4 mg Oral Q6H PRN Zannie Cove, MD       Or  . ondansetron [facility]) injection 4 mg  4 mg Intravenous Q6H PRN Zannie Cove, MD      . pantoprazole (PROTONIX) 80 mg in sodium chloride 0.9 % 250 mL infusion  8 mg/hr Intravenous Continuous Zannie Cove, MD 25 mL/hr at 01/21/12 1206 8 mg/hr at 01/21/12 1206  . potassium chloride 10 mEq in 100 mL IVPB  10  mEq Intravenous Q1 Hr x 4 Meagan Hunt, MD   10 mEq at 01/20/12 2319  . potassium chloride 10 mEq in 100 mL IVPB  10 mEq Intravenous Once Zannie Cove, MD   10 mEq at 01/21/12 0131  . sodium chloride 0.9 % 1,000 mL with potassium chloride 20 mEq infusion   Intravenous Continuous Zannie Cove, MD 75 mL/hr at 01/21/12 1129    . sodium chloride 0.9 % bolus 1,000 mL  1,000 mL Intravenous Once Cyndra Numbers, MD    1,000 mL at 01/20/12 1754  . sodium chloride 0.9 % bolus 1,000 mL  1,000 mL Intravenous Once Cyndra Numbers, MD   1,000 mL at 01/20/12 1930  . DISCONTD: dextrose 5 %-0.45 % sodium chloride infusion   Intravenous STAT Cyndra Numbers, MD      . DISCONTD: morphine 4 MG/ML injection 4 mg  4 mg Intravenous Once Cyndra Numbers, MD      . DISCONTD: ondansetron (ZOFRAN) injection 4 mg  4 mg Intravenous Once Cyndra Numbers, MD      . DISCONTD: ondansetron (ZOFRAN) injection 4 mg  4 mg Intravenous Q8H PRN Cyndra Numbers, MD      . DISCONTD: pantoprazole (PROTONIX) 80 mg in sodium chloride 0.9 % 100 mL IVPB  80 mg Intravenous Once Cyndra Numbers, MD        Allergies as of 01/20/2012 - Review Complete 01/20/2012  Allergen Reaction Noted  . Nsaids  10/17/2011    Family History  Problem Relation Age of Onset  . Malignant hyperthermia Neg Hx     History   Social History  . Marital Status: Married    Spouse Name: N/A    Number of Children: N/A  . Years of Education: N/A   Occupational History  . Not on file.   Social History Main Topics  . Smoking status: Current Everyday Smoker -- 1.0 packs/day for 20 years    Types: Cigarettes    Last Attempt to Quit: 10/17/2011  . Smokeless tobacco: Never Used  . Alcohol Use: No  . Drug Use: No  . Sexually Active: Not on file   Other Topics Concern  . Not on file   Social History Narrative   Lives in [place] with husband and 2 kids. Works as a Psychologist, prison and probation services.    Review of Systems: Positive for: anorexia, fatigue, weakness, malaise, involuntary weight loss, headaches Gen: Denies any fever, chills, rigors, night sweats, and sleep disorder CV: Denies chest pain, angina, palpitations, syncope, orthopnea, PND, peripheral edema, and claudication. Resp: Denies dyspnea, cough, sputum, wheezing, coughing up blood. GI: Described in detail in HPI.    GU : Denies urinary burning, blood in urine, urinary frequency, urinary hesitancy, nocturnal urination, and urinary  incontinence. MS: Denies joint pain or swelling.  Denies muscle weakness, cramps, atrophy.  Derm: Denies rash, itching, oral ulcerations, hives, unhealing ulcers.  Psych: Denies depression, anxiety, memory loss, suicidal ideation, hallucinations,  and confusion. Heme: Denies bruising, bleeding, and enlarged lymph nodes. Neuro:  Denies any , dizziness, paresthesias. Endo:  Denies any problems with DM, thyroid, adrenal function.  Physical Exam: Vital signs in last 24 hours: Temp:  [97.3 F (36.3 C)-98 F (36.7 C)] 97.3 F (36.3 C) (02/11 1335) Pulse Rate:  [69-92] 79  (02/11 1335) Resp:  [18] 18  (02/11 1335) BP: (84-122)/(53-79) 122/79 mmHg (02/11 1335) SpO2:  [94 %-100 %] 100 % (02/11 1335) Weight:  [52.4 kg (115 lb 8.3 oz)] 52.4 kg (115 lb 8.3 oz) (02/11 0010)  General:   Alert,  Somewhat thin-appearing, but is in no acute distress Head:  Normocephalic and atraumatic. Eyes:  Sclera clear, no icterus.   Conjunctiva pink. Ears:  Normal auditory acuity. Nose:  No deformity, discharge,  or lesions. Mouth:  No deformity or lesions.  Oropharynx pink, somewhat dry mucous membranes Neck:  Supple; no masses or thyromegaly. Lungs:  Clear throughout to auscultation.   No wheezes, crackles, or rhonchi. No acute distress. Heart:  Regular rate and rhythm; no murmurs, clicks, rubs,  or gallops. Abdomen:  Soft, mild epigastric tenderness; no appreciable distention. No masses, hepatosplenomegaly or hernias noted. Normal bowel sounds, without guarding, and without rebound.     Msk:  Symmetrical without gross deformities. Normal posture. Pulses:  Normal pulses noted. Extremities:  Without clubbing or edema. Neurologic:  Alert and  oriented x4;  grossly normal neurologically. Skin:  Intact without significant lesions or rashes. Cervical Nodes:  No significant cervical adenopathy. Psych:  Alert and cooperative. Normal mood and affect.   Lab Results:  [facility] 01/20/12 1705  WBC 11.9*  HGB  12.9  HCT 39.3  PLT 473*   BMET  Basename 01/20/12 1705  NA 132*  K 2.9*  CL 90*  CO2 34*  GLUCOSE 101*  BUN 18  CREATININE 1.05  CALCIUM 9.9   LFT  Basename 01/20/12 1705  PROT 7.3  ALBUMIN 3.7  AST 10  ALT <5  ALKPHOS 88  BILITOT 0.2*  BILIDIR --  IBILI --   PT/INR No results found for this basename: LABPROT:2,INR:2 in the last 72 hours  Studies/Results: Dg Abd Acute W/chest  01/20/2012  *RADIOLOGY REPORT*  Clinical Data: Abdominal pain  ACUTE ABDOMEN SERIES (ABDOMEN 2 VIEW & CHEST 1 VIEW)  Comparison: 10/15/2009  Findings: The  cardiomediastinal silhouette is stable.  No acute infiltrate or pleural effusion.  No pulmonary edema.  Mild hyperinflation again noted.  Post cholecystectomy surgical clips are noted.  There is nonspecific nonobstructive bowel gas pattern. Moderate colonic stool.  IMPRESSION: No acute disease.  Post cholecystectomy surgical clips are noted. Nonobstructive bowel gas pattern.  Moderate colonic stool.  Original Report Authenticated By: Natasha Mead, M.D.   Impression:  1.  Nausea with vomiting. 2.  Peptic duodenal ulcer, seemingly NSAID-related. 3.  Post-bulbar duodenal stricture, ulcer-related. 4.  Failure-to-thrive with unintentional weight loss.  Likely secondary to post-bulbar stricture.  Plan:  1.  Supportive management with PPI and antiemetics. 2.  Clear liquid diet.  Would not advance at this time. 3.  Surgical consultation for consideration of diverting gastrojejunostomy versus (less likely) stricture resection. 4.  I have discussed case personally with Dr. Madilyn Fireman.  Both he and I feel that further endoscopic attempts at stricture dilatation would highly likely be unhelpful and would carry not insignificant risk of complications (especially enteric perforation). 5.  Could consider upper GI series, at discretion of surgical consultants. 6.  Will follow.  Thank you for the consultation.   LOS: 1 day   Jeraline Marcinek M  01/21/2012, 2:00  PM

## 2012-01-21 NOTE — Consult Note (Signed)
Reason for Consult: Gastric Outlet obstruction from peptic ulcer disease Consulting Surgeon: Dr. Jamey Ripa  Referring Physician: Dr. Dulce Sellar   HPI: [name] Barnett is an 36 y.o. female who is known to our group. She was seen and treated for gallbladder disease in August by Dr. Dwain Sarna, undergoing lap chol. Initially, she did well, but developed some recurrent upper abd discomfort and N/V. She had a CT in October that showed no post-op complications but did find mild dilatation of the duodenal bulb. She was referred to GI for further workup, which included UGI again showing mild prominence of the duodenal bulb. She was treated conservatively but eventually needed EGD with balloon dilatation of a post-bulbar duodenal stricture, however review of the Op Note reveals that they were still not able to pass the stricture after ballooning. This was a few weeks ago 12/31/11. She did have about  a week and a half of improved sxs. However, she has now had recurrent upper abd discomfort and N/V. She has been readmitted for N/V. GI has seen her again and recommended surgical consultation. She is trying clears today and feels ok so far. She is on PPI drip as well as pepcid.  Past Medical History:  Past Medical History  Diagnosis Date  . Hypertension   . GERD (gastroesophageal reflux disease)   . Duodenal obstruction     Surgical History:  Past Surgical History  Procedure Date  . Cholecystectomy 07/28/11  . Temporomandibular joint surgery     2 surgeries  . Esophagogastroduodenoscopy 10/17/2011    Procedure: ESOPHAGOGASTRODUODENOSCOPY (EGD);  Surgeon: Barrie Folk, MD;  Location[facility] ENDOSCOPY;  Service: Endoscopy;  Laterality: N/A;  . Balloon dilation 12/31/2011    Procedure: BALLOON DILATION;  Surgeon: Barrie Folk, MD;  Location: [facility] ENDOSCOPY;  Service: Endoscopy;  Laterality: N/A;    Family History:  Family History  Problem Relation Age of Onset  . Malignant hyperthermia Neg Hx     Social History:   reports that she has been smoking Cigarettes.  She has a 20 pack-year smoking history. She has never used smokeless tobacco. She reports that she does not drink alcohol or use illicit drugs.  Allergies:  Allergies  Allergen Reactions  . Nsaids     Pt diagnosed with near-obstructing circumferential ulcer of duodenum [id number]    Medications:  Prior to Admission:  Prescriptions prior to admission  Medication Sig Dispense Refill  . lisinopril (PRINIVIL,ZESTRIL) 20 MG tablet Take 20 mg by mouth daily.        ROS: See HPI for pertinent findings, otherwise complete 10 system review negative.  Physical Exam: Blood pressure 84/53, pulse 72, temperature 98 F (36.7 C), temperature source Oral, resp. rate 18, height 5\' 3"  (1.6 m), weight 52.4 kg (115 lb 8.3 oz), last menstrual period 12/21/2011, SpO2 94.00%.  General Appearance:  Alert, cooperative, no distress, appears stated age.  Head:  Normocephalic, without obvious abnormality, atraumatic  ENT: Unremarkable  Neck: Supple, symmetrical, trachea midline, no adenopathy, thyroid: not enlarged, symmetric, no tenderness/mass/nodules  Lungs:   Clear to auscultation bilaterally, no w/r/r, respirations unlabored without use of accessory muscles.  Chest Wall:  No tenderness or deformity  Heart:  Regular rate and rhythm, S1, S2 normal, no murmur, rub or gallop. Carotids 2+ without bruit.  Abdomen:   Soft, mild tenderness RUQ, non distended. Bowel sounds active all four quadrants,  no masses, no organomegaly.  Genitalia:  Normal. No hernias  Rectal:  Deferred.  Extremities: Extremities normal, atraumatic, no cyanosis  or edema  Pulses: 2+ and symmetric  Skin: Skin color, texture, turgor normal, no rashes or lesions  Neurologic: Normal affect, no gross deficits.     Labs: CBC  Basename 01/20/12 1705  WBC 11.9*  HGB 12.9  HCT 39.3  PLT 473*   MET  Basename 01/20/12 1705  NA 132*  K 2.9*  CL 90*  CO2 34*  GLUCOSE 101*  BUN 18    CREATININE 1.05  CALCIUM 9.9    Basename 01/20/12 1705  PROT 7.3  ALBUMIN 3.7  AST 10  ALT <5  ALKPHOS 88  BILITOT 0.2*  BILIDIR --  IBILI --  LIPASE 30   PT/INR No results found for this basename: LABPROT:2,INR:2 in the last 72 hours ABG No results found for this basename: PHART:2,PCO2:2,PO2:2,HCO3:2 in the last 72 hours    Dg Abd Acute W/chest  01/20/2012  *RADIOLOGY REPORT*  Clinical Data: Abdominal pain  ACUTE ABDOMEN SERIES (ABDOMEN 2 VIEW & CHEST 1 VIEW)  Comparison: 10/15/2009  Findings: The  cardiomediastinal silhouette is stable.  No acute infiltrate or pleural effusion.  No pulmonary edema.  Mild hyperinflation again noted.  Post cholecystectomy surgical clips are noted.  There is nonspecific nonobstructive bowel gas pattern. Moderate colonic stool.  IMPRESSION: No acute disease.  Post cholecystectomy surgical clips are noted. Nonobstructive bowel gas pattern.  Moderate colonic stool.  Original Report Authenticated By: Natasha Mead, M.D.    Assessment/Plan: Principal Problem:  *Epigastric pain Active Problems:  Duodenal ulcer, acute with obstruction  Nausea & vomiting  Hypokalemia  Will check gastrin level. Repeat attempt at dilatation unlikely to be helpful as first attempt didn't work. She is frustrated by this course and wants to proceed with "something" We explained surgical options to pt for treatment of stricture including Vagotomy and Antrectomy with B-I vs. B-II We explained procedure and risks, as well as potential complications and post-op course. She will discuss with husband before deciding anything. Will tentatively plan for Wednesday afternoon. Dr. Jamey Ripa has seen and will follow.  Marianna Fuss PA-C 01/21/2012, 1:23 PM

## 2012-01-21 NOTE — Consult Note (Signed)
As noted she has a stricture not relieved by an attempt at dilation. Now her options are to have another dilation vs surgery. I reviewed the surgical options with her including risks and complications. Will touch bases with GI to be sure they agree its time for surgery

## 2012-01-21 NOTE — Progress Notes (Signed)
Patient arrived at 5500 at approximately 21:00 on 01/20/12.  She was oriented to the unit and the call light.  She is from home with family, alert and oriented times four, and ambulatory with no assist.  She came in for bouts of N/V.  VS are WNL.  Plan of care is to control nausea and pain.  Will continue to monitor patient.  Amel Kitch, Joan Mayans, RN

## 2012-01-21 NOTE — Progress Notes (Signed)
Utilization Review Completed.Lilou Kneip T2/10/2012   

## 2012-01-22 LAB — COMPREHENSIVE METABOLIC PANEL
Albumin: 2.7 g/dL — ABNORMAL LOW (ref 3.5–5.2)
BUN: 8 mg/dL (ref 6–23)
Creatinine, Ser: 0.68 mg/dL (ref 0.50–1.10)
GFR calc Af Amer: >90 mL/min (ref >90)
Total Protein: 5.3 g/dL — ABNORMAL LOW (ref 6.0–8.3)

## 2012-01-22 LAB — CBC
HCT: 29.4 % — ABNORMAL LOW (ref 36.0–46.0)
Hemoglobin: 10.4 g/dL — ABNORMAL LOW (ref 12.0–15.0)
MCH: 31.1 pg (ref 26.0–34.0)
MCH: 31.2 pg (ref 26.0–34.0)
MCHC: 32.7 g/dL (ref 30.0–36.0)
MCHC: 32.6 g/dL (ref 30.0–36.0)
MCV: 95.1 fL (ref 78.0–100.0)
MCV: 95.8 fL (ref 78.0–100.0)
RDW: 14.3 % (ref 11.5–15.5)

## 2012-01-22 MED ORDER — CEFAZOLIN SODIUM 1-5 GM-% IV SOLN
1.0000 g | INTRAVENOUS | Status: AC
  Administered 2012-01-23: 1 g via INTRAVENOUS
  Filled 2012-01-22: qty 50

## 2012-01-22 MED ORDER — CHLORHEXIDINE GLUCONATE 4 % EX LIQD
1.0000 "application " | Freq: Once | CUTANEOUS | Status: DC
  Filled 2012-01-22 (×2): qty 15

## 2012-01-22 MED ORDER — LACTATED RINGERS IV SOLN: INTRAVENOUS | Status: DC

## 2012-01-22 MED ORDER — NICOTINE 21 MG/24HR TD PT24
21.0000 mg | MEDICATED_PATCH | Freq: Every day | TRANSDERMAL | Status: DC
  Administered 2012-01-22: 21 mg via TRANSDERMAL
  Filled 2012-01-22 (×2): qty 1

## 2012-01-22 MED ORDER — MIDAZOLAM HCL 2 MG/2ML IJ SOLN: 1.0000 mg | INTRAMUSCULAR | Status: DC | PRN

## 2012-01-22 MED ORDER — ACETAMINOPHEN 325 MG PO TABS: 650.0000 mg | ORAL_TABLET | ORAL | Status: DC | PRN

## 2012-01-22 MED ORDER — FENTANYL CITRATE 0.05 MG/ML IJ SOLN: 50.0000 ug | INTRAMUSCULAR | Status: DC | PRN

## 2012-01-22 NOTE — Progress Notes (Signed)
Patient ID: [name] Barnett, female   DOB: 1976/03/25, 36 y.o.   MRN: [id number]    Subjective: Pt is tired of dealing with this situation.  She wants to go ahead and pursue surgical intervention.  Objective: Vital signs in last 24 hours: Temp:  [97.3 F (36.3 C)-97.9 F (36.6 C)] 97.9 F (36.6 C) (02/12 0529) Pulse Rate:  [64-79] 64  (02/12 0529) Resp:  [17-18] 17  (02/12 0529) BP: (92-122)/(59-79) 92/59 mmHg (02/12 0529) SpO2:  [99 %-100 %] 99 % (02/12 0529)    Intake/Output from previous day: 02/11 0701 - 02/12 0700 In: 1360 [P.O.:1360] Out: -  Intake/Output this shift:    PE: Abd: soft, minimally tender, +BS, ND  Lab Results:   [facility] 01/22/12 0530 01/20/12 1705  WBC 6.7 11.9*  HGB 9.6* 12.9  HCT 29.4* 39.3  PLT 348 473*   BMET  Basename 01/22/12 0530 01/20/12 1705  NA 141 132*  K 4.2 2.9*  CL 111 90*  CO2 22 34*  GLUCOSE 83 101*  BUN 8 18  CREATININE 0.68 1.05  CALCIUM 9.0 9.9   PT/INR No results found for this basename: LABPROT:2,INR:2 in the last 72 hours   Studies/Results: Dg Abd Acute W/chest  01/20/2012  *RADIOLOGY REPORT*  Clinical Data: Abdominal pain  ACUTE ABDOMEN SERIES (ABDOMEN 2 VIEW & CHEST 1 VIEW)  Comparison: 10/15/2009  Findings: The  cardiomediastinal silhouette is stable.  No acute infiltrate or pleural effusion.  No pulmonary edema.  Mild hyperinflation again noted.  Post cholecystectomy surgical clips are noted.  There is nonspecific nonobstructive bowel gas pattern. Moderate colonic stool.  IMPRESSION: No acute disease.  Post cholecystectomy surgical clips are noted. Nonobstructive bowel gas pattern.  Moderate colonic stool.  Original Report Authenticated By: Natasha Mead, M.D.    Anti-infectives: Anti-infectives    None       Assessment/Plan  1. PUD with duodenal stricture 2. Tobacco abuse  Plan: 1. D/w Dr. Matthias Hughs, GI does not feel as though further dilatations would be useful.  They agree to go ahead and pursue surgical  intervention as well.  We will plan for OR tomorrow. 2. NPO after MN   LOS: 2 days    Stacey Maura E 01/22/2012

## 2012-01-22 NOTE — Progress Notes (Addendum)
Patient ID: [name] Barnett, female   DOB: Mar 24, 1976, 36 y.o.   MRN: [id number]  Assessment/Plan:   Principal Problem:   *Duodenal ulcer, acute with obstruction  - GI and surgery are following  - will follow up on recommendations  - clear liquid for now but NPO after midnight for surgery in am - PPI and antiemetics   Active Problems:   Anemia - unclear why is there a drop of 2-3 units - no evidence of active bleed - follow up CBC in am and transfuse PRN  Epigastric pain  - secondary to duodenal ulcer   Nausea & vomiting  - patient reports no vomiting since admission  - continue zofran as needed   Hypokalemia  - secondary to GI losses  - resolved  EDUCATION  - test results and diagnostic studies were discussed with patient at the bedside  - patient has verbalized the understanding  - questions were answered at the bedside and contact information was provided for additional questions or concerns    Subjective: No events overnight. Patient denies chest pain, shortness of breath, abdominal pain. Had bowel movement and reports ambulating.  Objective:  Vital signs in last 24 hours:  Filed Vitals:   01/21/12 1335 01/21/12 2158 01/22/12 0529 01/22/12 1340  BP: 122/79 97/60 92/59  108/72  Pulse: 79 78 64 72  Temp: 97.3 F (36.3 C) 97.9 F (36.6 C) 97.9 F (36.6 C) 97.8 F (36.6 C)  TempSrc: Axillary  Oral Oral  Resp: 18 18 17 18   Height:      Weight:      SpO2: 100% 99% 99% 99%    Intake/Output from previous day:   Intake/Output Summary (Last 24 hours) at 01/22/12 2104 Last data filed at 01/22/12 1853  Gross per 24 hour  Intake   2940 ml  Output      0 ml  Net   2940 ml    Physical Exam: General: Alert, awake, oriented x3, in no acute distress. HEENT: No bruits, no goiter. Moist mucous membranes, no scleral icterus, no conjunctival pallor. Heart: Regular rate and rhythm, S1/S2 +, no murmurs, rubs, gallops. Lungs: Clear to auscultation bilaterally. No  wheezing, no rhonchi, no rales.  Abdomen: Soft, nontender, nondistended, positive bowel sounds. Extremities: No clubbing or cyanosis, no pitting edema,  positive pedal pulses. Neuro: Grossly nonfocal.  Lab Results:  Basic Metabolic Panel:    Component Value Date/Time   NA 141 01/22/2012 0530   K 4.2 01/22/2012 0530   CL 111 01/22/2012 0530   CO2 22 01/22/2012 0530   BUN 8 01/22/2012 0530   CREATININE 0.68 01/22/2012 0530   CREATININE 1.18* 09/12/2011 1533   GLUCOSE 83 01/22/2012 0530   CALCIUM 9.0 01/22/2012 0530   CBC:    Component Value Date/Time   WBC 6.7 01/22/2012 0530   HGB 9.6* 01/22/2012 0530   HCT 29.4* 01/22/2012 0530   PLT 348 01/22/2012 0530   MCV 95.1 01/22/2012 0530   NEUTROABS 7.6 01/20/2012 1705   LYMPHSABS 3.2 01/20/2012 1705   MONOABS 1.0 01/20/2012 1705   EOSABS 0.1 01/20/2012 1705   BASOSABS 0.0 01/20/2012 1705      Lab 01/22/12 0530 01/20/12 1705  WBC 6.7 11.9*  HGB 9.6* 12.9  HCT 29.4* 39.3  PLT 348 473*  MCV 95.1 93.6  MCH 31.1 30.7  MCHC 32.7 32.8  RDW 14.3 14.2  LYMPHSABS -- 3.2  MONOABS -- 1.0  EOSABS -- 0.1  BASOSABS -- 0.0  BANDABS -- --  Lab 01/22/12 0530 01/20/12 1705  NA 141 132*  K 4.2 2.9*  CL 111 90*  CO2 22 34*  GLUCOSE 83 101*  BUN 8 18  CREATININE 0.68 1.05  CALCIUM 9.0 9.9  MG -- --   Studies/Results: No results found.  Medications: Scheduled Meds:   .  ceFAZolin (ANCEF) IV  1 g Intravenous 60 min Pre-Op  . chlorhexidine  1 application Topical Once  . nicotine  21 mg Transdermal Daily   Continuous Infusions:   . lactated ringers    . pantoprozole (PROTONIX) infusion 8 mg/hr (01/22/12 1340)  . sodium chloride 0.9 % 1,000 mL with potassium chloride 20 mEq infusion 75 mL/hr at 01/22/12 1341   PRN Meds:.acetaminophen, fentaNYL, HYDROmorphone, LORazepam, midazolam, ondansetron (ZOFRAN) IV, ondansetron    LOS: 2 days   Shelbey Spindler 01/22/2012, 9:04 PM  [facility] Pager: [phone]

## 2012-01-22 NOTE — Progress Notes (Signed)
Plans for surgery noted.  Pt's outpt procedure repts reviewed.  We agree with surgical management at this time.  We believe that this is the better option, compared to further attempts at medical therapy and endoscopic dilatation.  The patient herself indicates a desire to get the problem resolved without need for "coming back," and it is not clear that endoscopic dilatation would be successful or durable in its effect.  We will sign off, but would be happy to see the patient again at your request if we can be of any help.  Florencia Reasons, M.D. [phone]

## 2012-01-23 ENCOUNTER — Encounter (HOSPITAL_COMMUNITY): Payer: Self-pay | Admitting: Anesthesiology

## 2012-01-23 ENCOUNTER — Inpatient Hospital Stay (HOSPITAL_COMMUNITY): Payer: BC Managed Care – PPO | Admitting: Anesthesiology

## 2012-01-23 ENCOUNTER — Encounter (HOSPITAL_COMMUNITY)
Admission: EM | Disposition: A | Discharge status: Home / Self Care | Payer: Self-pay | Source: Home / Self Care | Attending: Internal Medicine

## 2012-01-23 ENCOUNTER — Other Ambulatory Visit (INDEPENDENT_AMBULATORY_CARE_PROVIDER_SITE_OTHER): Payer: Self-pay | Admitting: Surgery

## 2012-01-23 HISTORY — PX: VAGOTOMY: SHX5282

## 2012-01-23 LAB — BASIC METABOLIC PANEL
Calcium: 8.9 mg/dL (ref 8.4–10.5)
Chloride: 113 mEq/L — ABNORMAL HIGH (ref 96–112)
Creatinine, Ser: 0.63 mg/dL (ref 0.50–1.10)
GFR calc Af Amer: >90 mL/min (ref >90)
GFR calc non Af Amer: >90 mL/min (ref >90)

## 2012-01-23 LAB — CBC
MCHC: 33.4 g/dL (ref 30.0–36.0)
MCV: 94.6 fL (ref 78.0–100.0)
Platelets: 356 10*3/uL (ref 150–400)
RDW: 14.1 % (ref 11.5–15.5)
WBC: 6.9 10*3/uL (ref 4.0–10.5)

## 2012-01-23 LAB — MAGNESIUM: Magnesium: 1.6 mg/dL (ref 1.5–2.5)

## 2012-01-23 SURGERY — VAGOTOMY
Anesthesia: General | Site: Abdomen | Wound class: Clean Contaminated

## 2012-01-23 MED ORDER — NEOSTIGMINE METHYLSULFATE 1 MG/ML IJ SOLN
INTRAMUSCULAR | Status: DC | PRN
  Administered 2012-01-23: 4 mg via INTRAVENOUS

## 2012-01-23 MED ORDER — GLYCOPYRROLATE 0.2 MG/ML IJ SOLN
INTRAMUSCULAR | Status: DC | PRN
  Administered 2012-01-23: .6 mg via INTRAVENOUS

## 2012-01-23 MED ORDER — DEXAMETHASONE SODIUM PHOSPHATE 4 MG/ML IJ SOLN
INTRAMUSCULAR | Status: DC | PRN
  Administered 2012-01-23: 4 mg via INTRAVENOUS

## 2012-01-23 MED ORDER — DEXTROSE 5 % IV SOLN
500.0000 mg | Freq: Three times a day (TID) | INTRAVENOUS | Status: AC
  Administered 2012-01-23 – 2012-01-26 (×8): 500 mg via INTRAVENOUS
  Filled 2012-01-23 (×15): qty 5

## 2012-01-23 MED ORDER — NALOXONE HCL 0.4 MG/ML IJ SOLN: 0.4000 mg | INTRAMUSCULAR | Status: DC | PRN

## 2012-01-23 MED ORDER — KCL IN DEXTROSE-NACL 20-5-0.45 MEQ/L-%-% IV SOLN
INTRAVENOUS | Status: DC
  Administered 2012-01-23 – 2012-01-24 (×2): via INTRAVENOUS
  Administered 2012-01-24: 125 mL/h via INTRAVENOUS
  Administered 2012-01-25: 09:00:00 via INTRAVENOUS
  Administered 2012-01-25: 1000 mL via INTRAVENOUS
  Administered 2012-01-26 – 2012-01-27 (×2): via INTRAVENOUS
  Administered 2012-01-28: 1000 mL via INTRAVENOUS
  Administered 2012-01-29: 01:00:00 via INTRAVENOUS
  Filled 2012-01-23 (×17): qty 1000

## 2012-01-23 MED ORDER — HYDROMORPHONE 0.3 MG/ML IV SOLN
INTRAVENOUS | Status: AC
  Filled 2012-01-23: qty 25

## 2012-01-23 MED ORDER — 0.9 % SODIUM CHLORIDE (POUR BTL) OPTIME
TOPICAL | Status: DC | PRN
  Administered 2012-01-23: 1000 mL

## 2012-01-23 MED ORDER — ONDANSETRON HCL 4 MG/2ML IJ SOLN: 4.0000 mg | Freq: Four times a day (QID) | INTRAMUSCULAR | Status: DC | PRN

## 2012-01-23 MED ORDER — KCL IN DEXTROSE-NACL 20-5-0.45 MEQ/L-%-% IV SOLN
INTRAVENOUS | Status: AC
  Filled 2012-01-23: qty 1000

## 2012-01-23 MED ORDER — HYDROMORPHONE HCL PF 1 MG/ML IJ SOLN
INTRAMUSCULAR | Status: AC
  Filled 2012-01-23: qty 1

## 2012-01-23 MED ORDER — METOPROLOL TARTRATE 1 MG/ML IV SOLN
INTRAVENOUS | Status: DC | PRN
  Administered 2012-01-23: 1 mg via INTRAVENOUS

## 2012-01-23 MED ORDER — PROPOFOL 10 MG/ML IV EMUL
INTRAVENOUS | Status: DC | PRN
  Administered 2012-01-23: 200 mg via INTRAVENOUS

## 2012-01-23 MED ORDER — LACTATED RINGERS IV SOLN
INTRAVENOUS | Status: DC | PRN
  Administered 2012-01-23 (×2): via INTRAVENOUS

## 2012-01-23 MED ORDER — DIPHENHYDRAMINE HCL 12.5 MG/5ML PO ELIX
12.5000 mg | ORAL_SOLUTION | Freq: Four times a day (QID) | ORAL | Status: DC | PRN
  Filled 2012-01-23: qty 5

## 2012-01-23 MED ORDER — DIPHENHYDRAMINE HCL 50 MG/ML IJ SOLN: 12.5000 mg | Freq: Four times a day (QID) | INTRAMUSCULAR | Status: DC | PRN

## 2012-01-23 MED ORDER — MIDAZOLAM HCL 5 MG/5ML IJ SOLN
INTRAMUSCULAR | Status: DC | PRN
  Administered 2012-01-23: 2 mg via INTRAVENOUS

## 2012-01-23 MED ORDER — PROMETHAZINE HCL 25 MG/ML IJ SOLN: 6.2500 mg | INTRAMUSCULAR | Status: DC | PRN

## 2012-01-23 MED ORDER — HYDROMORPHONE HCL PF 1 MG/ML IJ SOLN
0.2500 mg | INTRAMUSCULAR | Status: DC | PRN
  Administered 2012-01-23 (×3): 0.5 mg via INTRAVENOUS

## 2012-01-23 MED ORDER — CEFAZOLIN SODIUM 1-5 GM-% IV SOLN
1.0000 g | Freq: Four times a day (QID) | INTRAVENOUS | Status: AC
  Administered 2012-01-23 – 2012-01-24 (×3): 1 g via INTRAVENOUS
  Filled 2012-01-23 (×3): qty 50

## 2012-01-23 MED ORDER — ROCURONIUM BROMIDE 100 MG/10ML IV SOLN
INTRAVENOUS | Status: DC | PRN
  Administered 2012-01-23: 50 mg via INTRAVENOUS

## 2012-01-23 MED ORDER — METHOCARBAMOL 100 MG/ML IJ SOLN
500.0000 mg | Freq: Three times a day (TID) | INTRAMUSCULAR | Status: DC
  Filled 2012-01-23 (×3): qty 5

## 2012-01-23 MED ORDER — HEPARIN SODIUM (PORCINE) 5000 UNIT/ML IJ SOLN
5000.0000 [IU] | Freq: Three times a day (TID) | INTRAMUSCULAR | Status: DC
  Administered 2012-01-23: 5000 [IU] via SUBCUTANEOUS
  Filled 2012-01-23 (×23): qty 1

## 2012-01-23 MED ORDER — NICOTINE 21 MG/24HR TD PT24
21.0000 mg | MEDICATED_PATCH | Freq: Every day | TRANSDERMAL | Status: DC
  Administered 2012-01-24 – 2012-01-30 (×7): 21 mg via TRANSDERMAL
  Filled 2012-01-23 (×7): qty 1

## 2012-01-23 MED ORDER — VECURONIUM BROMIDE 10 MG IV SOLR
INTRAVENOUS | Status: DC | PRN
  Administered 2012-01-23: 2 mg via INTRAVENOUS
  Administered 2012-01-23: 1 mg via INTRAVENOUS

## 2012-01-23 MED ORDER — HYDROMORPHONE 0.3 MG/ML IV SOLN
INTRAVENOUS | Status: DC
  Administered 2012-01-23 – 2012-01-24 (×3): via INTRAVENOUS
  Administered 2012-01-24: 4.39 mg via INTRAVENOUS
  Administered 2012-01-25: 07:00:00 via INTRAVENOUS
  Administered 2012-01-26: 0.6 mg via INTRAVENOUS
  Administered 2012-01-26: 1.79 mg via INTRAVENOUS
  Administered 2012-01-27: 0.399 mg via INTRAVENOUS
  Administered 2012-01-27: 0.8 mg via INTRAVENOUS
  Administered 2012-01-27: 0.4 mg via INTRAVENOUS
  Administered 2012-01-28 (×2): 0.399 mg via INTRAVENOUS
  Administered 2012-01-28: 0.8 mg via INTRAVENOUS
  Administered 2012-01-28: 0.5999 mg via INTRAVENOUS
  Administered 2012-01-29: 1.39 mg via INTRAVENOUS
  Administered 2012-01-29: 1.19 mg via INTRAVENOUS
  Administered 2012-01-29: 0.8 mg via INTRAVENOUS
  Filled 2012-01-23: qty 25

## 2012-01-23 MED ORDER — MEPERIDINE HCL 25 MG/ML IJ SOLN: 6.2500 mg | INTRAMUSCULAR | Status: DC | PRN

## 2012-01-23 MED ORDER — METHOCARBAMOL 100 MG/ML IJ SOLN
500.0000 mg | Freq: Three times a day (TID) | INTRAMUSCULAR | Status: DC
  Filled 2012-01-23 (×2): qty 5

## 2012-01-23 MED ORDER — ONDANSETRON HCL 4 MG/2ML IJ SOLN
INTRAMUSCULAR | Status: DC | PRN
  Administered 2012-01-23: 4 mg via INTRAVENOUS

## 2012-01-23 MED ORDER — HEMOSTATIC AGENTS (NO CHARGE) OPTIME
TOPICAL | Status: DC | PRN
  Administered 2012-01-23: 1 via TOPICAL

## 2012-01-23 MED ORDER — FENTANYL CITRATE 0.05 MG/ML IJ SOLN
INTRAMUSCULAR | Status: DC | PRN
  Administered 2012-01-23 (×2): 50 ug via INTRAVENOUS
  Administered 2012-01-23 (×2): 100 ug via INTRAVENOUS
  Administered 2012-01-23 (×2): 50 ug via INTRAVENOUS
  Administered 2012-01-23: 100 ug via INTRAVENOUS

## 2012-01-23 MED ORDER — SODIUM CHLORIDE 0.9 % IJ SOLN: 9.0000 mL | INTRAMUSCULAR | Status: DC | PRN

## 2012-01-23 SURGICAL SUPPLY — 59 items
BLADE SURG 10 STRL SS (BLADE) ×2 IMPLANT
BLADE SURG ROTATE 9660 (MISCELLANEOUS) IMPLANT
CANISTER SUCTION 2500CC (MISCELLANEOUS) ×2 IMPLANT
CATH FOLEY 2WAY SLVR  5CC 20FR (CATHETERS)
CATH FOLEY 2WAY SLVR  5CC 22FR (CATHETERS)
CATH FOLEY 2WAY SLVR 5CC 20FR (CATHETERS) IMPLANT
CATH FOLEY 2WAY SLVR 5CC 22FR (CATHETERS) IMPLANT
CHLORAPREP W/TINT 26ML (MISCELLANEOUS) ×2 IMPLANT
CLIP TI LARGE 6 (CLIP) ×4 IMPLANT
CLIP TI MEDIUM 6 (CLIP) ×2 IMPLANT
CLOTH BEACON ORANGE TIMEOUT ST (SAFETY) ×2 IMPLANT
CONT SPEC 4OZ CLIKSEAL STRL BL (MISCELLANEOUS) ×4 IMPLANT
COVER SURGICAL LIGHT HANDLE (MISCELLANEOUS) ×2 IMPLANT
DRAIN PENROSE 1/2X36 STERILE (WOUND CARE) ×2 IMPLANT
DRAPE LAPAROSCOPIC ABDOMINAL (DRAPES) ×2 IMPLANT
DRAPE UTILITY 15X26 W/TAPE STR (DRAPE) ×4 IMPLANT
DRAPE WARM FLUID 44X44 (DRAPE) ×2 IMPLANT
ELECT BLADE 6.5 EXT (BLADE) ×4 IMPLANT
ELECT CAUTERY BLADE 6.4 (BLADE) ×2 IMPLANT
ELECT REM PT RETURN 9FT ADLT (ELECTROSURGICAL) ×2
ELECTRODE REM PT RTRN 9FT ADLT (ELECTROSURGICAL) ×1 IMPLANT
GAUZE SPONGE 4X4 16PLY XRAY LF (GAUZE/BANDAGES/DRESSINGS) IMPLANT
GLOVE BIO SURGEON STRL SZ7.5 (GLOVE) ×2 IMPLANT
GLOVE BIOGEL PI IND STRL 7.5 (GLOVE) ×1 IMPLANT
GLOVE BIOGEL PI INDICATOR 7.5 (GLOVE) ×1
GLOVE EUDERMIC 7 POWDERFREE (GLOVE) ×4 IMPLANT
GOWN PREVENTION PLUS XLARGE (GOWN DISPOSABLE) ×2 IMPLANT
GOWN STRL NON-REIN LRG LVL3 (GOWN DISPOSABLE) ×4 IMPLANT
HEMOSTAT SNOW SURGICEL 2X4 (HEMOSTASIS) ×2 IMPLANT
KIT BASIN OR (CUSTOM PROCEDURE TRAY) ×2 IMPLANT
KIT ROOM TURNOVER OR (KITS) ×2 IMPLANT
LIGASURE IMPACT 36 18CM CVD LR (INSTRUMENTS) ×2 IMPLANT
NS IRRIG 1000ML POUR BTL (IV SOLUTION) ×6 IMPLANT
PACK GENERAL/GYN (CUSTOM PROCEDURE TRAY) ×2 IMPLANT
PAD ARMBOARD 7.5X6 YLW CONV (MISCELLANEOUS) ×2 IMPLANT
RELOAD AUTO 90-4.8 TA90 GRN (ENDOMECHANICALS) ×2 IMPLANT
SPECIMEN JAR X LARGE (MISCELLANEOUS) IMPLANT
SPONGE GAUZE 4X4 12PLY (GAUZE/BANDAGES/DRESSINGS) IMPLANT
SPONGE INTESTINAL PEANUT (DISPOSABLE) IMPLANT
SPONGE LAP 18X18 X RAY DECT (DISPOSABLE) ×2 IMPLANT
STAPLER TA90 4.8 THK SLIMI (STAPLE) ×2 IMPLANT
STAPLER VISISTAT 35W (STAPLE) ×2 IMPLANT
SUCTION POOLE TIP (SUCTIONS) ×2 IMPLANT
SUT CHROMIC 3 0 SH 27 (SUTURE) ×4 IMPLANT
SUT NOVA 1 T20/GS 25DT (SUTURE) IMPLANT
SUT NOVA NAB DX-16 0-1 5-0 T12 (SUTURE) IMPLANT
SUT PDS AB 1 CTX 36 (SUTURE) IMPLANT
SUT PDS AB 1 TP1 96 (SUTURE) ×4 IMPLANT
SUT SILK 2 0 SH CR/8 (SUTURE) ×4 IMPLANT
SUT SILK 2 0 TIES 10X30 (SUTURE) ×2 IMPLANT
SUT SILK 3 0 SH CR/8 (SUTURE) ×8 IMPLANT
SUT SILK 3 0 TIES 10X30 (SUTURE) ×2 IMPLANT
SUT SILK 4 0 TIE 10X30 (SUTURE) IMPLANT
SYRINGE TOOMEY DISP (SYRINGE) ×2 IMPLANT
TOWEL OR 17X24 6PK STRL BLUE (TOWEL DISPOSABLE) ×2 IMPLANT
TOWEL OR 17X26 10 PK STRL BLUE (TOWEL DISPOSABLE) ×2 IMPLANT
TRAY FOLEY CATH 14FRSI W/METER (CATHETERS) IMPLANT
TUBE CONNECTING 12X1/4 (SUCTIONS) ×2 IMPLANT
YANKAUER SUCT BULB TIP NO VENT (SUCTIONS) ×4 IMPLANT

## 2012-01-23 NOTE — Progress Notes (Signed)
Subjective: Appeard comfortable, pain-wise,immediately post-op.  Objective: Vital signs in last 24 hours: Temp:  [97 F (36.1 C)-98.1 F (36.7 C)] 97.9 F (36.6 C) (02/13 1811) Pulse Rate:  [69-96] 80  (02/13 1811) Resp:  [13-19] 19  (02/13 1811) BP: (107-168)/(72-90) 152/82 mmHg (02/13 1811) SpO2:  [97 %-100 %] 100 % (02/13 1811) Weight change:  Last BM Date: 01/22/12  Intake/Output from previous day: 02/12 0701 - 02/13 0700 In: 4015 [P.O.:2515; I.V.:1500] Out: -  Total I/O In: 2000 [I.V.:2000] Out: 695 [Urine:545; Blood:150]   Physical Exam: General: Comfortable, drowsy, buy easily rousable, communicative, fully oriented, not short of breath at rest.  HEENT:  Mild clinical pallor, no jaundice, no conjunctival injection or discharge. Hydration status appears fair. Has NG-T in situ. NECK:  Supple, JVP not seen, no carotid bruits, no palpable lymphadenopathy, no palpable goiter. CHEST:  Clinically clear to auscultation, no wheezes, no crackles. HEART:  Sounds 1 and 2 heard, normal, regular, no murmurs. ABDOMEN:  Full, soft, dry, clean dressings. Bowel sounds are muted. GENITALIA:  Not examined. LOWER EXTREMITIES:  No pitting edema, palpable peripheral pulses. MUSCULOSKELETAL SYSTEM:  Unremarkable. CENTRAL NERVOUS SYSTEM:  No focal neurologic deficit on gross examination.  Lab Results:  Basename 01/23/12 0550 01/22/12 2111  WBC 6.9 7.9  HGB 10.0* 10.4*  HCT 29.9* 31.9*  PLT 356 360    Basename 01/23/12 0550 01/22/12 0530  NA 141 141  K 4.5 4.2  CL 113* 111  CO2 20 22  GLUCOSE 87 83  BUN 4* 8  CREATININE 0.63 0.68  CALCIUM 8.9 9.0   No results found for this or any previous visit (from the past 240 hour(s)).   Studies/Results: No results found.  Medications: Scheduled Meds:   .  ceFAZolin (ANCEF) IV  1 g Intravenous 60 min Pre-Op  .  ceFAZolin (ANCEF) IV  1 g Intravenous Q6H  . dextrose 5 % and 0.45 % NaCl with KCl 20 mEq/L      . heparin  5,000 Units  Subcutaneous Q8H  . HYDROmorphone      . HYDROmorphone PCA 0.3 mg/mL   Intravenous Q4H  . HYDROmorphone PCA 0.3 mg/mL      . methocarbamol  500 mg Intravenous Q8H  . nicotine  21 mg Transdermal Daily  . DISCONTD: chlorhexidine  1 application Topical Once  . DISCONTD: nicotine  21 mg Transdermal Daily   Continuous Infusions:   . dextrose 5 % and 0.45 % NaCl with KCl 20 mEq/L 125 mL/hr at 01/23/12 1509  . pantoprozole (PROTONIX) infusion 8 mg/hr (01/22/12 2339)  . DISCONTD: lactated ringers    . DISCONTD: sodium chloride 0.9 % 1,000 mL with potassium chloride 20 mEq infusion 75 mL/hr at 01/23/12 0024   PRN Meds:.diphenhydrAMINE, diphenhydrAMINE, HYDROmorphone, naloxone, ondansetron (ZOFRAN) IV, sodium chloride, DISCONTD: 0.9 % irrigation (POUR BTL), DISCONTD: 0.9 % irrigation (POUR BTL), DISCONTD: 0.9 % irrigation (POUR BTL), DISCONTD: acetaminophen, DISCONTD: fentaNYL, DISCONTD: hemostatic agents, DISCONTD: HYDROmorphone, DISCONTD: LORazepam, DISCONTD: meperidine, DISCONTD: midazolam, DISCONTD: ondansetron (ZOFRAN) IV, DISCONTD: ondansetron DISCONTD: promethazine  Assessment/Plan:  Principal Problem:  *Duodenal ulcer, acute with obstruction: 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 Patient is status post gastric antrectomy/Vagotomy on 01/23/12, by Dr Cyndia Bent. Appears stable post-op. Managing per surgical team. Active Problems:  1. Nausea & vomiting: See above.  2. Epigastric pain: See above.   3.Hypokalemia: secondary to #1. Repleting as indicated.  Comment: Patient complained of musculo-skeletal upper back pain today. Likely positional, due to restricted mobility, post-op./ Will  manage with prn Robaxin. Disposition, per surgical team.  LOS: 3 days   [name] Barnett,[name] Barnett 01/23/2012, 6:41 PM

## 2012-01-23 NOTE — Progress Notes (Signed)
Duodenal ulcer, acute with obstruction   Subjective: Patient feels OK and feels ready for surgery today.  Objective: Vital signs in last 24 hours: Temp:  [97.4 F (36.3 C)-98 F (36.7 C)] 98 F (36.7 C) (02/13 0646) Pulse Rate:  [72-75] 75  (02/13 0646) Resp:  [18] 18  (02/13 0646) BP: (107-127)/(72-82) 107/72 mmHg (02/13 0646) SpO2:  [97 %-100 %] 97 % (02/13 0646)    Intake/Output from previous day: 02/12 0701 - 02/13 0700 In: 4015 [P.O.:2515; I.V.:1500] Out: -  Intake/Output this shift:    General appearance: alert and no distress GI: soft, non-tender; bowel sounds normal; no masses,  no organomegaly  Lab Results:  Results for orders placed during the hospital encounter of 01/20/12 (from the past 24 hour(s))  CBC     Status: Abnormal   Collection Time   01/22/12  9:11 PM      Component Value Range   WBC 7.9  4.0 - 10.5 (K/uL)   RBC 3.33 (*) 3.87 - 5.11 (MIL/uL)   Hemoglobin 10.4 (*) 12.0 - 15.0 (g/dL)   HCT 45.4 (*) 09.8 - 46.0 (%)   MCV 95.8  78.0 - 100.0 (fL)   MCH 31.2  26.0 - 34.0 (pg)   MCHC 32.6  30.0 - 36.0 (g/dL)   RDW 11.9  14.7 - 82.9 (%)   Platelets 360  150 - 400 (K/uL)  CBC     Status: Abnormal   Collection Time   01/23/12  5:50 AM      Component Value Range   WBC 6.9  4.0 - 10.5 (K/uL)   RBC 3.16 (*) 3.87 - 5.11 (MIL/uL)   Hemoglobin 10.0 (*) 12.0 - 15.0 (g/dL)   HCT 56.2 (*) 13.0 - 46.0 (%)   MCV 94.6  78.0 - 100.0 (fL)   MCH 31.6  26.0 - 34.0 (pg)   MCHC 33.4  30.0 - 36.0 (g/dL)   RDW 86.5  78.4 - 69.6 (%)   Platelets 356  150 - 400 (K/uL)  BASIC METABOLIC PANEL     Status: Abnormal   Collection Time   01/23/12  5:50 AM      Component Value Range   Sodium 141  135 - 145 (mEq/L)   Potassium 4.5  3.5 - 5.1 (mEq/L)   Chloride 113 (*) 96 - 112 (mEq/L)   CO2 20  19 - 32 (mEq/L)   Glucose, Bld 87  70 - 99 (mg/dL)   BUN 4 (*) 6 - 23 (mg/dL)   Creatinine, Ser 2.95  0.50 - 1.10 (mg/dL)   Calcium 8.9  8.4 - 28.4 (mg/dL)   GFR calc non Af Amer  >90  >90 (mL/min)   GFR calc Af Amer >90  >90 (mL/min)  MAGNESIUM     Status: Normal   Collection Time   01/23/12  5:50 AM      Component Value Range   Magnesium 1.6  1.5 - 2.5 (mg/dL)     Studies/Results Radiology     MEDS, Scheduled    .  ceFAZolin (ANCEF) IV  1 g Intravenous 60 min Pre-Op  . chlorhexidine  1 application Topical Once  . nicotine  21 mg Transdermal Daily     Assessment: Duodenal ulcer, acute with obstruction   Plan: To OR today for likely V&A. Discussed with her in detail yesterday evening and she has no more questions this AM. We reviewed risks and complications including leaks, delayed gastric emptying and occasional anastomotic stricture.   LOS: 3 days  Currie Paris, MD, [facility], [place] [phone]   01/23/2012 9:07 AM

## 2012-01-23 NOTE — Anesthesia Preprocedure Evaluation (Signed)
Anesthesia Evaluation  Patient identified by MRN, date of birth, ID band Patient awake    Reviewed: Allergy & Precautions, H&P , NPO status , Patient's Chart, lab work & pertinent test results  Airway Mallampati: I  Neck ROM: Full    Dental  (+) Teeth Intact   Pulmonary neg pulmonary ROS,  clear to auscultation        Cardiovascular hypertension, Regular Normal    Neuro/Psych    GI/Hepatic Neg liver ROS, PUD, GERD-  ,  Endo/Other  Negative Endocrine ROS  Renal/GU negative Renal ROS     Musculoskeletal   Abdominal   Peds  Hematology   Anesthesia Other Findings   Reproductive/Obstetrics                           Anesthesia Physical Anesthesia Plan  ASA: II  Anesthesia Plan: General   Post-op Pain Management:    Induction: Intravenous  Airway Management Planned: Oral ETT  Additional Equipment:   Intra-op Plan:   Post-operative Plan: Extubation in OR  Informed Consent: I have reviewed the patients History and Physical, chart, labs and discussed the procedure including the risks, benefits and alternatives for the proposed anesthesia with the patient or authorized representative who has indicated his/her understanding and acceptance.   Dental advisory given  Plan Discussed with: CRNA and Surgeon  Anesthesia Plan Comments:         Anesthesia Quick Evaluation

## 2012-01-23 NOTE — Op Note (Signed)
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 Emelia Loron  Anesthesia: General   Clinical History and Indications: Patient has a known duodenal stricture beyond the prepyloric area which was not improved within attempted dilation. She has had continued problems with gastric outlet obstruction despite aggressive medical management for her ulcer disease. At this point she elected to proceed to some definitive surgery with plans for a vagotomy and antrectomy.    Description of Procedure: I met the patient again in the preoperative area and reviewed the plans and answer questions. She was taken to the operating room and after satisfactory general anesthesia had been obtained the abdomen was prepped and draped in the time out was performed. A midline incision was made from the xiphoid to above the umbilicus and the peritoneal cavity entered. The stomach was somewhat dilated and boggy. There was chronic inflammatory changes around the C-loop. The liver looked normal and there didn't appear to be any other obvious abnormality. There were no particular adhesions from her prior laparoscopic cholecystectomy.  I used the cautery to open the peritoneum lateral to the C-loop to see if we can mobilize this up for her resection with an attempt for a Billroth I anastomosis. However it was clear after just a little bit of dissection that we would not be able to resect the duodenum past the stricture because of the distal nature of the stricture. I therefore elected to do a vagotomy and antrectomy with Billroth II.  I used a combination of the LigaSure and 2-0 silk ties to divide the omentum off of the greater curvature from the pylorus up  until the mid portion of the stomach. Likewise the omentum along the lesser curve was taken down to allow was proximal to the incisura. I had excellent mobility of the stomach and the pylorus was well up away from the pancreas.  At this point I turned my attention to doing the vagotomy. I was able to open the peritoneum around the esophagus and using gentle blunt dissection it my finger behind the esophagus and elevated from the posterior tissues. The esophagus was encircled with a Penrose drain for traction. With distraction I was able to identify a posterior structure consistent with the posterior vagus nerve. This was resected between clips. The anterior vagus nerve was identified as a fairly small structure which is also resected between clips. There cervical smaller strands which I thought represented small fibers of vagus nerve. At the completion of the vagotomy the esophagus was fairly cleared off and there were no nervous structures visible. I put a small pack here for a while and turned my attention back to the stomach.  The stomach was divided with the TA 90 stapler. The duodenum was then divided with a TA 90 stapler just beyond the pylorus so we had complete antrectomy. The duodenal staple line was inverted with interrupted 3-0 silk sutures of the Lembert technique.Likewise the majority of the gastric staple line was closed with 2-0 silk Lembert sutures. I then put a Coker clamp across the inferior portion of the staple line from the greater curve for the anastomosis. I brought a limb of jejunum through a small window. A gastrojejunostomy was then created.I put a row of 3-0 silk Lembert sutures from the jejunum to the posterior wall of  the stomach and tied that row down.I then opened the jejunum with the cautery and used a knife to open the stomach cutting the Coker clamp off of the staple line. I used 3-0 chromic to do and anaerobes starting in mid portion of the posterior row and running lock  sutures along the back and canal sutures for the anterior row. A final anterior row was placed with Lembert silk sutures.The stomach was then brought through the window in the Transverse nasal colon and brought the anastomosis below that and then sutured the mesocolon to the stomach so that the anastomosis was below the mesocolon.  The anastomosis was widely patent. The stomach was then irrigated with the NG tube until it was clear. We then checked around the esophagus for bleeding and made sure everything was dry. A little bit of Jamelle Haring was left in this area just to be sure there would be no oozing later on. The abdomen was then copiously irrigated and we made sure everything was dry. The fascia was then closed with a running looped #1 PDS and the skin with staples. The patient are the procedure well. Estimated blood loss was about 150 cc. There were no operative complications. All counts are correct. Larissa Pegg J  .01/23/2012 2:28 PM

## 2012-01-23 NOTE — Preoperative (Signed)
Beta Blockers   Reason not to administer Beta Blockers:Not Applicable, pt. Not on beta blocker 

## 2012-01-23 NOTE — Anesthesia Procedure Notes (Signed)
Procedure Name: Intubation Date/Time: 01/23/2012 12:07 PM Performed by: Elon Alas Pre-anesthesia Checklist: Patient identified, Timeout performed and Emergency Drugs available Patient Re-evaluated:Patient Re-evaluated prior to inductionOxygen Delivery Method: Circle System Utilized Preoxygenation: Pre-oxygenation with 100% oxygen Intubation Type: IV induction and Cricoid Pressure applied Ventilation: Mask ventilation without difficulty Grade View: Grade III Tube type: Oral Tube size: 7.5 mm Number of attempts: 1 Airway Equipment and Method: stylet Placement Confirmation: positive ETCO2,  ETT inserted through vocal cords under direct vision and breath sounds checked- equal and bilateral Secured at: 21 cm Tube secured with: Tape Dental Injury: Teeth and Oropharynx as per pre-operative assessment

## 2012-01-23 NOTE — Anesthesia Postprocedure Evaluation (Signed)
  Anesthesia Post-op Note  Patient: Hospital doctor F Whiters  Procedure(s) Performed: Procedure(s) (LRB): VAGOTOMY (N/A) ANTRECTOMY (N/A)  Patient Location: PACU  Anesthesia Type: General  Level of Consciousness: awake  Airway and Oxygen Therapy: Patient Spontanous Breathing  Post-op Pain: mild  Post-op Assessment: Post-op Vital signs reviewed  Post-op Vital Signs: stable  Complications: No apparent anesthesia complications

## 2012-01-23 NOTE — Progress Notes (Signed)
Discussed at length with her including risks and complications and told her no guarantee of results. All questions answered.

## 2012-01-23 NOTE — Transfer of Care (Signed)
Immediate Anesthesia Transfer of Care Note  Patient: [name] Barnett  Procedure(s) Performed: Procedure(s) (LRB): VAGOTOMY (N/A) ANTRECTOMY (N/A)  Patient Location: PACU  Anesthesia Type: General  Level of Consciousness: awake, alert  and oriented  Airway & Oxygen Therapy: Patient Spontanous Breathing and Patient connected to nasal cannula oxygen  Post-op Assessment: Report given to PACU RN, Post -op Vital signs reviewed and stable and Patient moving all extremities X 4  Post vital signs: Reviewed and stable  Complications: No apparent anesthesia complications

## 2012-01-24 ENCOUNTER — Encounter (HOSPITAL_COMMUNITY): Payer: Self-pay | Admitting: Surgery

## 2012-01-24 LAB — COMPREHENSIVE METABOLIC PANEL
ALT: 14 U/L (ref 0–35)
AST: 18 U/L (ref 0–37)
Albumin: 2.7 g/dL — ABNORMAL LOW (ref 3.5–5.2)
Calcium: 9 mg/dL (ref 8.4–10.5)
Chloride: 109 mEq/L (ref 96–112)
Creatinine, Ser: 0.66 mg/dL (ref 0.50–1.10)
Sodium: 137 mEq/L (ref 135–145)

## 2012-01-24 LAB — CBC
HCT: 33.8 % — ABNORMAL LOW (ref 36.0–46.0)
Hemoglobin: 11.1 g/dL — ABNORMAL LOW (ref 12.0–15.0)
MCH: 31 pg (ref 26.0–34.0)
MCHC: 32.8 g/dL (ref 30.0–36.0)
MCV: 94.4 fL (ref 78.0–100.0)
RBC: 3.58 MIL/uL — ABNORMAL LOW (ref 3.87–5.11)

## 2012-01-24 MED ORDER — HYDROMORPHONE 0.3 MG/ML IV SOLN
INTRAVENOUS | Status: AC
  Administered 2012-01-24: 10:00:00
  Filled 2012-01-24: qty 25

## 2012-01-24 MED ORDER — HYDROMORPHONE 0.3 MG/ML IV SOLN
INTRAVENOUS | Status: AC
  Filled 2012-01-24: qty 25

## 2012-01-24 NOTE — Progress Notes (Signed)
1 Day Post-Op  Subjective: Pt ok. Pain control good with PCA. She's OOB to chair already this am. Denies CP, SOB, leg pain  Objective: Vital signs in last 24 hours: Temp:  [97 F (36.1 C)-98.3 F (36.8 C)] 98.3 F (36.8 C) (02/14 0513) Pulse Rate:  [69-96] 74  (02/14 0513) Resp:  [13-19] 16  (02/14 0513) BP: (124-168)/(71-90) 124/71 mmHg (02/14 0513) SpO2:  [97 %-100 %] 97 % (02/14 0513) Last BM Date: 01/22/12  Intake/Output this shift:    Physical Exam: BP 124/71  Pulse 74  Temp(Src) 98.3 F (36.8 C) (Oral)  Resp 16  Ht 5\' 3"  (1.6 m)  Wt 52.4 kg (115 lb 8.3 oz)  BMI 20.46 kg/m2  SpO2 97%  LMP 12/21/2011 Lungs: CTA without w/r/r Heart: Regular Abdomen: soft, ND, appropriately tender   Incision c/d/i without erythema or hematoma. Ext: No edema or tenderness   Labs: CBC  Basename 01/24/12 0630 01/23/12 0550  WBC 14.8* 6.9  HGB 11.1* 10.0*  HCT 33.8* 29.9*  PLT 355 356   BMET  Basename 01/24/12 0630 01/23/12 0550  NA 137 141  K 4.4 4.5  CL 109 113*  CO2 22 20  GLUCOSE 102* 87  BUN 5* 4*  CREATININE 0.66 0.63  CALCIUM 9.0 8.9   LFT  Basename 01/24/12 0630  PROT 5.6*  ALBUMIN 2.7*  AST 18  ALT 14  ALKPHOS 66  BILITOT 0.2*  BILIDIR --  IBILI --  LIPASE --   PT/INR No results found for this basename: LABPROT:2,INR:2 in the last 72 hours ABG No results found for this basename: PHART:2,PCO2:2,PO2:2,HCO3:2 in the last 72 hours  Studies/Results: No results found.  Assessment: Principal Problem:  *Duodenal ulcer, acute with obstruction Active Problems:  Nausea & vomiting  Epigastric pain  Hypokalemia   Procedure(s): V&A, B2  Plan: Cont NPO/NGT OOB/IS DC Foley today Follow labs.  LOS: 4 days    Alyse Low 01/24/2012 8:40 AM

## 2012-01-24 NOTE — Progress Notes (Signed)
Subjective: Comfortable, sitting in chair.  Objective: Vital signs in last 24 hours: Temp:  [97.8 F (36.6 C)-98.3 F (36.8 C)] 98.2 F (36.8 C) (02/14 1500) Pulse Rate:  [72-102] 102  (02/14 1500) Resp:  [16-20] 19  (02/14 1500) BP: (111-159)/(71-88) 111/72 mmHg (02/14 1500) SpO2:  [94 %-100 %] 98 % (02/14 1500) FiO2 (%):  [98 %] 98 % (02/14 0837) Weight change:  Last BM Date: 01/22/12  Intake/Output from previous day: 02/13 0701 - 02/14 0700 In: 2905 [I.V.:2750; IV Piggyback:155] Out: 1595 [Urine:1445; Blood:150] Total I/O In: -  Out: 1350 [Urine:1350]   Physical Exam: General: Comfortable, alert, communicative, fully oriented, not short of breath at rest.  HEENT:  Mild clinical pallor, no jaundice, no conjunctival injection or discharge. Hydration status appears fair. Still has NG-T in situ. NECK:  Supple, JVP not seen, no carotid bruits, no palpable lymphadenopathy, no palpable goiter. CHEST:  Clinically clear to auscultation, no wheezes, no crackles. HEART:  Sounds 1 and 2 heard, normal, regular, no murmurs. ABDOMEN:  Full, soft, dry, clean dressings. Bowel sounds are muted. GENITALIA:  Not examined. LOWER EXTREMITIES:  No pitting edema, palpable peripheral pulses. MUSCULOSKELETAL SYSTEM:  Unremarkable. CENTRAL NERVOUS SYSTEM:  No focal neurologic deficit on gross examination.  Lab Results:  Basename 01/24/12 0630 01/23/12 0550  WBC 14.8* 6.9  HGB 11.1* 10.0*  HCT 33.8* 29.9*  PLT 355 356    Basename 01/24/12 0630 01/23/12 0550  NA 137 141  K 4.4 4.5  CL 109 113*  CO2 22 20  GLUCOSE 102* 87  BUN 5* 4*  CREATININE 0.66 0.63  CALCIUM 9.0 8.9   No results found for this or any previous visit (from the past 240 hour(s)).   Studies/Results: No results found.  Medications: Scheduled Meds:    .  ceFAZolin (ANCEF) IV  1 g Intravenous Q6H  . dextrose 5 % and 0.45 % NaCl with KCl 20 mEq/L      . heparin  5,000 Units Subcutaneous Q8H  . HYDROmorphone       . HYDROmorphone PCA 0.3 mg/mL   Intravenous Q4H  . HYDROmorphone PCA 0.3 mg/mL      . HYDROmorphone PCA 0.3 mg/mL      . HYDROmorphone PCA 0.3 mg/mL      . methocarbamol(ROBAXIN) IV  500 mg Intravenous Q8H  . nicotine  21 mg Transdermal Daily  . DISCONTD: methocarbamol  500 mg Intravenous Q8H  . DISCONTD: methocarbamol  500 mg Intravenous Q8H   Continuous Infusions:    . dextrose 5 % and 0.45 % NaCl with KCl 20 mEq/L 125 mL/hr at 01/24/12 0320  . pantoprozole (PROTONIX) infusion 8 mg/hr (01/24/12 0834)   PRN Meds:.diphenhydrAMINE, diphenhydrAMINE, HYDROmorphone, naloxone, ondansetron (ZOFRAN) IV, sodium chloride  Assessment/Plan:  Principal Problem:  *Duodenal ulcer, acute with obstruction: 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 Patient is status post gastric antrectomy/Vagotomy on 01/23/12, by Dr Cyndia Bent. Appears stable on post-op day# 1. Managing per surgical team. Active Problems:  1. Nausea & vomiting: See above.  2. Epigastric pain: See above.   3. Hypokalemia: Secondary to #1. Repleting as indicated.  4. Musculo-skeletal upper back pain: This occurred on 01/23/12, and was positional, due to restricted mobility, post-op. Managed with prn Robaxin. Now resolved.  5. HTN: Controlled.  Comment: Disposition, per surgical team.  LOS: 4 days   [name] Barnett,[name] 01/24/2012, 4:48 PM

## 2012-01-24 NOTE — Progress Notes (Signed)
Appears to be doing well one day post op. Discussed surgical findings and procedure with her. Agree with A&P per KB,PA

## 2012-01-25 ENCOUNTER — Inpatient Hospital Stay (HOSPITAL_COMMUNITY): Payer: BC Managed Care – PPO

## 2012-01-25 DIAGNOSIS — E164 Increased secretion of gastrin: Secondary | ICD-10-CM | POA: Insufficient documentation

## 2012-01-25 LAB — CBC
HCT: 30.8 % — ABNORMAL LOW (ref 36.0–46.0)
Hemoglobin: 10.3 g/dL — ABNORMAL LOW (ref 12.0–15.0)
MCH: 31.4 pg (ref 26.0–34.0)
MCHC: 33.4 g/dL (ref 30.0–36.0)
MCV: 93.9 fL (ref 78.0–100.0)
Platelets: 315 10*3/uL (ref 150–400)
RBC: 3.28 MIL/uL — ABNORMAL LOW (ref 3.87–5.11)
RDW: 14 % (ref 11.5–15.5)
WBC: 11.7 10*3/uL — ABNORMAL HIGH (ref 4.0–10.5)

## 2012-01-25 LAB — COMPREHENSIVE METABOLIC PANEL WITH GFR
ALT: 8 U/L (ref 0–35)
AST: 13 U/L (ref 0–37)
Albumin: 2.3 g/dL — ABNORMAL LOW (ref 3.5–5.2)
Alkaline Phosphatase: 69 U/L (ref 39–117)
BUN: 4 mg/dL — ABNORMAL LOW (ref 6–23)
CO2: 21 meq/L (ref 19–32)
Calcium: 8.7 mg/dL (ref 8.4–10.5)
Chloride: 107 meq/L (ref 96–112)
Creatinine, Ser: 0.59 mg/dL (ref 0.50–1.10)
GFR calc Af Amer: >90 mL/min
GFR calc non Af Amer: >90 mL/min
Glucose, Bld: 97 mg/dL (ref 70–99)
Potassium: 4.1 meq/L (ref 3.5–5.1)
Sodium: 139 meq/L (ref 135–145)
Total Bilirubin: 0.2 mg/dL — ABNORMAL LOW (ref 0.3–1.2)
Total Protein: 5.5 g/dL — ABNORMAL LOW (ref 6.0–8.3)

## 2012-01-25 MED ORDER — HYDROMORPHONE 0.3 MG/ML IV SOLN
INTRAVENOUS | Status: AC
  Filled 2012-01-25: qty 25

## 2012-01-25 MED ORDER — PHENOL 1.4 % MT LIQD
1.0000 | OROMUCOSAL | Status: DC | PRN
  Administered 2012-01-25: 1 via OROMUCOSAL
  Filled 2012-01-25: qty 177

## 2012-01-25 MED ORDER — MENTHOL 3 MG MT LOZG
1.0000 | LOZENGE | OROMUCOSAL | Status: DC | PRN
  Filled 2012-01-25: qty 9

## 2012-01-25 NOTE — Progress Notes (Signed)
Patient ID: [name] Barnett, female   DOB: 1976-03-24, 36 y.o.   MRN: [id number] 2 Days Post-Op  Subjective: Pt c/o back pain. Otherwise very irritated and frustrated with NGT and multiple lines.  No flatus.  Objective: Vital signs in last 24 hours: Temp:  [98.1 F (36.7 C)-98.4 F (36.9 C)] 98.4 F (36.9 C) (02/15 0500) Pulse Rate:  [102-109] 108  (02/15 0500) Resp:  [16-20] 16  (02/15 0654) BP: (101-140)/(66-87) 140/87 mmHg (02/15 0500) SpO2:  [94 %-98 %] 94 % (02/15 0654) FiO2 (%):  [94 %-98 %] 94 % (02/14 2023) Last BM Date: 01/22/12  Intake/Output from previous day: 02/14 0701 - 02/15 0700 In: 3477.5 [I.V.:3322.5; IV Piggyback:155] Out: 3050 [Urine:2850; Emesis/NG output:200] Intake/Output this shift: Total I/O In: 55 [IV Piggyback:55] Out: -   PE: Abd: soft, tender, -BS, ND, incision c/d/i, NGT with bilious output Back: there is an area of swelling, like a lump on her lower back.  Nontender, no erythema or signs of infection  Lab Results:   Basename 01/25/12 0630 01/24/12 0630  WBC 11.7* 14.8*  HGB 10.3* 11.1*  HCT 30.8* 33.8*  PLT 315 355   BMET  Basename 01/24/12 0630 01/23/12 0550  NA 137 141  K 4.4 4.5  CL 109 113*  CO2 22 20  GLUCOSE 102* 87  BUN 5* 4*  CREATININE 0.66 0.63  CALCIUM 9.0 8.9   PT/INR No results found for this basename: LABPROT:2,INR:2 in the last 72 hours   Studies/Results: No results found.  Anti-infectives: Anti-infectives     Start     Dose/Rate Route Frequency Ordered Stop   01/23/12 1800   ceFAZolin (ANCEF) IVPB 1 g/50 mL premix        1 g 100 mL/hr over 30 Minutes Intravenous Every 6 hours 01/23/12 1601 01/24/12 0716   01/23/12 0500   ceFAZolin (ANCEF) IVPB 1 g/50 mL premix        1 g 100 mL/hr over 30 Minutes Intravenous 60 min pre-op 01/22/12 1703 01/23/12 1205           Assessment/Plan  1. S/p V&A, with B2  2. Post-op ileus  Plan: 1. Continue NGT and await bowel function. 2. Patient needs to get up and  mobilize. 3. Will order spray and lozenges for throat   LOS: 5 days    [name] Barnett E 01/25/2012

## 2012-01-25 NOTE — Plan of Care (Signed)
Problem: Phase I Progression Outcomes Goal: Initial discharge plan identified Outcome: Completed/Met Date Met:  01/25/12 To return home with husband

## 2012-01-25 NOTE — Progress Notes (Signed)
Subjective: No flatus yet, not in acute pain, has mild cough, with yellowish phlegm  Objective: Vital signs in last 24 hours: Temp:  [97.9 F (36.6 C)-98.4 F (36.9 C)] 97.9 F (36.6 C) (02/15 1316) Pulse Rate:  [91-109] 91  (02/15 1316) Resp:  [16-20] 18  (02/15 1613) BP: (101-140)/(66-87) 114/75 mmHg (02/15 1316) SpO2:  [94 %-100 %] 99 % (02/15 1613) FiO2 (%):  [94 %] 94 % (02/14 2023) Weight change:  Last BM Date: 01/22/12  Intake/Output from previous day: 02/14 0701 - 02/15 0700 In: 3477.5 [I.V.:3322.5; IV Piggyback:155] Out: 3050 [Urine:2850; Emesis/NG output:200] Total I/O In: 55 [IV Piggyback:55] Out: -    Physical Exam: General: Comfortable, alert, communicative, fully oriented, not short of breath at rest.  HEENT:  Mild clinical pallor, no jaundice, no conjunctival injection or discharge. Hydration status appears fair. Still has NG-T in situ, draining bile. NECK:  Supple, JVP not seen, no carotid bruits, no palpable lymphadenopathy, no palpable goiter. CHEST:  Clinically clear to auscultation, no wheezes, no crackles. HEART:  Sounds 1 and 2 heard, normal, regular, no murmurs. ABDOMEN:  Full, soft, dry, clean dressings. Bowel sounds are muted. GENITALIA:  Not examined. LOWER EXTREMITIES:  No pitting edema, palpable peripheral pulses. MUSCULOSKELETAL SYSTEM:  Unremarkable. CENTRAL NERVOUS SYSTEM:  No focal neurologic deficit on gross examination.  Lab Results:  Basename 01/25/12 0630 01/24/12 0630  WBC 11.7* 14.8*  HGB 10.3* 11.1*  HCT 30.8* 33.8*  PLT 315 355    Basename 01/25/12 0630 01/24/12 0630  NA 139 137  K 4.1 4.4  CL 107 109  CO2 21 22  GLUCOSE 97 102*  BUN 4* 5*  CREATININE 0.59 0.66  CALCIUM 8.7 9.0   No results found for this or any previous visit (from the past 240 hour(s)).   Studies/Results: No results found.  Medications: Scheduled Meds:    . heparin  5,000 Units Subcutaneous Q8H  . HYDROmorphone PCA 0.3 mg/mL   Intravenous Q4H   . methocarbamol(ROBAXIN) IV  500 mg Intravenous Q8H  . nicotine  21 mg Transdermal Daily   Continuous Infusions:    . dextrose 5 % and 0.45 % NaCl with KCl 20 mEq/L 125 mL/hr at 01/25/12 0857  . 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 Patient is status post gastric antrectomy/Vagotomy on 01/23/12, by Dr Cyndia Bent. Appears stable on post-op day# 2, albeit, with post-op ileus. Managing per surgical team. Opioid analgesics, may be contributory. Active Problems:  1. Nausea & vomiting: See above.  2. Epigastric pain: See above. Comfortable on PCA pump.  3. Hypokalemia: Secondary to #1. Repleting as indicated and appears to have resolved..  4. Musculo-skeletal upper back pain: This occurred on 01/23/12, and was positional, due to restricted mobility, post-op. Managed with prn Robaxin. Now resolved.  5. HTN: Controlled.  6. Productive cough: Could be due to URI, and post-nasal drip. Phlegm looks purulent, so will do CXR, to rule out pneumonia. Will maintain low threshold for addressing with antibiotics.  Comment: Disposition, per surgical team.  LOS: 5 days   Micaiah Remillard,[name] 01/25/2012, 4:55 PM

## 2012-01-25 NOTE — Progress Notes (Signed)
As per note by KO,PA. Lungs sound ok no BS yet.  Gastrin slightly elevated and will need repeating in a month or so

## 2012-01-26 LAB — COMPREHENSIVE METABOLIC PANEL
ALT: 7 U/L (ref 0–35)
Alkaline Phosphatase: 67 U/L (ref 39–117)
Chloride: 108 mEq/L (ref 96–112)
GFR calc Af Amer: >90 mL/min (ref >90)
Glucose, Bld: 84 mg/dL (ref 70–99)
Potassium: 3.8 mEq/L (ref 3.5–5.1)
Sodium: 139 mEq/L (ref 135–145)
Total Protein: 5.3 g/dL — ABNORMAL LOW (ref 6.0–8.3)

## 2012-01-26 LAB — CBC
Hemoglobin: 9.9 g/dL — ABNORMAL LOW (ref 12.0–15.0)
MCHC: 32.9 g/dL (ref 30.0–36.0)
RBC: 3.27 MIL/uL — ABNORMAL LOW (ref 3.87–5.11)
WBC: 9 10*3/uL (ref 4.0–10.5)

## 2012-01-26 MED ORDER — SODIUM CHLORIDE 0.9 % IV SOLN
250.0000 mg | Freq: Three times a day (TID) | INTRAVENOUS | Status: DC
  Administered 2012-01-26 – 2012-01-30 (×12): 250 mg via INTRAVENOUS
  Filled 2012-01-26 (×14): qty 250

## 2012-01-26 MED ORDER — HYDROMORPHONE 0.3 MG/ML IV SOLN
INTRAVENOUS | Status: AC
  Filled 2012-01-26: qty 25

## 2012-01-26 NOTE — Progress Notes (Deleted)
5pm pt passed flatus, faint bowel sounds heard RUQ, NG drainage yellow/orange like.

## 2012-01-26 NOTE — Progress Notes (Addendum)
Wasted 2cc Dilaudid prior to adding new syringe to PCA pump, witnessed by Ginger Gleason RN.  Bonney Leitz RN

## 2012-01-26 NOTE — Progress Notes (Signed)
Patient ID: [name] Barnett, female   DOB: 1976-01-24, 36 y.o.   MRN: [id number] 3 Days Post-Op  Subjective: Pt continues to have no flatus.  NGT continues to put out bilious emesis.  Objective: Vital signs in last 24 hours: Temp:  [97.7 F (36.5 C)-98.2 F (36.8 C)] 97.7 F (36.5 C) (02/16 0518) Pulse Rate:  [91-102] 93  (02/16 0518) Resp:  [16-22] 22  (02/16 0518) BP: (103-136)/(68-82) 136/82 mmHg (02/16 0518) SpO2:  [94 %-100 %] 97 % (02/16 0518) Last BM Date: 01/22/12  Intake/Output from previous day: 02/15 0701 - 02/16 0700 In: 2765 [I.V.:2600; IV Piggyback:165] Out: 1850 [Urine:1550; Emesis/NG output:300] Intake/Output this shift:    PE: Sleeping, but arousable.   Abd: soft, non tender except at incision, -BS, ND, incision c/d/i, NGT with bilious output Ext: warm, well perfused.   Lab Results:   Basename 01/26/12 0614 01/25/12 0630  WBC 9.0 11.7*  HGB 9.9* 10.3*  HCT 30.1* 30.8*  PLT 294 315   BMET  Basename 01/26/12 0614 01/25/12 0630  NA 139 139  K 3.8 4.1  CL 108 107  CO2 21 21  GLUCOSE 84 97  BUN 3* 4*  CREATININE 0.58 0.59  CALCIUM 8.9 8.7   PT/INR No results found for this basename: LABPROT:2,INR:2 in the last 72 hours   Studies/Results: Dg Chest Port 1 View  01/25/2012  *RADIOLOGY REPORT*  Clinical Data: Chest pain.  Recent vagotomy and antrectomy on 01/23/2012.  PORTABLE CHEST - 1 VIEW  Comparison: Chest x-ray dated 01/20/2012  Findings: NG tube tip is below the diaphragm.  Surgical clips from the recent vagotomy and antrectomy.  Prominent free air in the abdomen.  Heart size and vascularity are normal.  Lungs are clear. No acute osseous abnormality.  IMPRESSION:  1.  No acute disease in the chest. 2.  Extensive free air in the abdomen, probably related to the recent antrectomy.  The patient's nurse was notified findings by Dr. Jena Gauss.  Original Report Authenticated By: Gwynn Burly, M.D.    Anti-infectives: Anti-infectives     Start      Dose/Rate Route Frequency Ordered Stop   01/23/12 1800   ceFAZolin (ANCEF) IVPB 1 g/50 mL premix        1 g 100 mL/hr over 30 Minutes Intravenous Every 6 hours 01/23/12 1601 01/24/12 0716   01/23/12 0500   ceFAZolin (ANCEF) IVPB 1 g/50 mL premix        1 g 100 mL/hr over 30 Minutes Intravenous 60 min pre-op 01/22/12 1703 01/23/12 1205           Assessment/Plan  1. S/p V&A, with B2  2. Post-op ileus  Plan: 1. Continue NGT and await bowel function. 2. Will try erythromycin IV 3.  If ileus continues much longer, may require TNA  LOS: 6 days    [facility] 01/26/2012

## 2012-01-26 NOTE — Progress Notes (Deleted)
5pm pt passed flatus, faint bowel sounds heard RUQ, NG drainage yellow orange like in color.  Bonney Leitz RN

## 2012-01-26 NOTE — Progress Notes (Signed)
5pm pt passed flatus, NG drainage yellow orange color, faint bowel sounds heard RUQ.  Bonney Leitz RN

## 2012-01-26 NOTE — Progress Notes (Signed)
Subjective: Still no flatus yet.  Objective: Vital signs in last 24 hours: Temp:  [97.7 F (36.5 C)-98.3 F (36.8 C)] 98.3 F (36.8 C) (02/16 1350) Pulse Rate:  [82-102] 82  (02/16 1350) Resp:  [16-22] 20  (02/16 1350) BP: (103-136)/(68-82) 127/82 mmHg (02/16 1350) SpO2:  [94 %-99 %] 96 % (02/16 1350) Weight change:  Last BM Date: 01/22/12 (pre surgery)  Intake/Output from previous day: 02/15 0701 - 02/16 0700 In: 2765 [I.V.:2600; IV Piggyback:165] Out: 1850 [Urine:1550; Emesis/NG output:300]     Physical Exam: General: Comfortable, alert, communicative, fully oriented, not short of breath at rest.  HEENT:  Mild clinical pallor, no jaundice, no conjunctival injection or discharge. Hydration status appears fair. Still has NG-T in situ, draining bile. NECK:  Supple, JVP not seen, no carotid bruits, no palpable lymphadenopathy, no palpable goiter. CHEST:  Clinically clear to auscultation, no wheezes, no crackles. HEART:  Sounds 1 and 2 heard, normal, regular, no murmurs. ABDOMEN:  Full, soft, dry, clean dressings. Bowel sounds are muted. GENITALIA:  Not examined. LOWER EXTREMITIES:  No pitting edema, palpable peripheral pulses. MUSCULOSKELETAL SYSTEM:  Unremarkable. CENTRAL NERVOUS SYSTEM:  No focal neurologic deficit on gross examination.  Lab Results:  Basename 01/26/12 0614 01/25/12 0630  WBC 9.0 11.7*  HGB 9.9* 10.3*  HCT 30.1* 30.8*  PLT 294 315    Basename 01/26/12 0614 01/25/12 0630  NA 139 139  K 3.8 4.1  CL 108 107  CO2 21 21  GLUCOSE 84 97  BUN 3* 4*  CREATININE 0.58 0.59  CALCIUM 8.9 8.7   No results found for this or any previous visit (from the past 240 hour(s)).   Studies/Results: Dg Chest Port 1 View  01/25/2012  *RADIOLOGY REPORT*  Clinical Data: Chest pain.  Recent vagotomy and antrectomy on 01/23/2012.  PORTABLE CHEST - 1 VIEW  Comparison: Chest x-ray dated 01/20/2012  Findings: NG tube tip is below the diaphragm.  Surgical clips from the  recent vagotomy and antrectomy.  Prominent free air in the abdomen.  Heart size and vascularity are normal.  Lungs are clear. No acute osseous abnormality.  IMPRESSION:  1.  No acute disease in the chest. 2.  Extensive free air in the abdomen, probably related to the recent antrectomy.  The patient's nurse was notified findings by Dr. Jena Gauss.  Original Report Authenticated By: Gwynn Burly, M.D.    Medications: Scheduled Meds:    . erythromycin  250 mg Intravenous Q8H  . heparin  5,000 Units Subcutaneous Q8H  . HYDROmorphone PCA 0.3 mg/mL   Intravenous Q4H  . methocarbamol(ROBAXIN) IV  500 mg Intravenous Q8H  . nicotine  21 mg Transdermal Daily   Continuous Infusions:    . dextrose 5 % and 0.45 % NaCl with KCl 20 mEq/L 75 mL/hr at 01/26/12 0723  . 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 Patient is status post gastric antrectomy/Vagotomy on 01/23/12, by Dr Cyndia Bent. Appears stable on post-op day# 3, but still has post-op ileus. Managing per surgical team. Opioid analgesics, may be contributory. Dr Luz Brazen has started iv Erythromycin today, as a prokinetic. Active Problems:  1. Nausea & vomiting: See above.  2. Epigastric pain: See above. Comfortable on PCA pump.  3. Hypokalemia: Secondary to #1. Repleting as indicated and appears to have resolved.[name]  4.  Musculo-skeletal upper back pain: This occurred on 01/23/12, and was positional, due to restricted mobility, post-op. Managed with prn Robaxin. Now resolved.  5. HTN: Controlled.  6. Productive cough: Could be due to URI, and post-nasal drip. CXR of 01/25/12, was devoid of acute changes. Erythromycin started for ileus, should be adequate cover for  possible URI.  Comment: Disposition, per surgical team.  LOS: 6 days   [name] Barnett,[name] Barnett 01/26/2012, 3:57 PM

## 2012-01-27 LAB — CBC
HCT: 28.2 % — ABNORMAL LOW (ref 36.0–46.0)
MCHC: 33.7 g/dL (ref 30.0–36.0)
MCV: 92.2 fL (ref 78.0–100.0)
RDW: 13.7 % (ref 11.5–15.5)

## 2012-01-27 LAB — BASIC METABOLIC PANEL
BUN: 3 mg/dL — ABNORMAL LOW (ref 6–23)
Creatinine, Ser: 0.6 mg/dL (ref 0.50–1.10)
GFR calc Af Amer: >90 mL/min (ref >90)
GFR calc non Af Amer: >90 mL/min (ref >90)
Potassium: 3.6 mEq/L (ref 3.5–5.1)

## 2012-01-27 NOTE — Progress Notes (Signed)
Patient ID: Clovia Cuff, female   DOB: 1976-07-23, 36 y.o.   MRN: [id number] 4 Days Post-Op  Subjective: Pt continues to have no flatus, but says abdomen feels "rumbly, like she is about to."  NGT still with a significant amount of output.  Objective: Vital signs in last 24 hours: Temp:  [97.8 F (36.6 C)-98.3 F (36.8 C)] 97.8 F (36.6 C) (02/17 0536) Pulse Rate:  [76-87] 76  (02/17 0536) Resp:  [16-20] 18  (02/17 0554) BP: (111-129)/(72-83) 111/72 mmHg (02/17 0536) SpO2:  [96 %] 96 % (02/17 0554) Last BM Date: 01/22/12  Intake/Output from previous day: 02/16 0701 - 02/17 0700 In: 2380 [P.O.:30; I.V.:2100; IV Piggyback:250] Out: 1327 [Urine:2; Emesis/NG output:1325] Intake/Output this shift:    PE: A&O x3  Abd: soft, non tender except at incision, BS, ND, incision c/d/i Ext: warm, well perfused.   Lab Results:   Basename 01/26/12 0614 01/25/12 0630  WBC 9.0 11.7*  HGB 9.9* 10.3*  HCT 30.1* 30.8*  PLT 294 315   BMET  Basename 01/26/12 0614 01/25/12 0630  NA 139 139  K 3.8 4.1  CL 108 107  CO2 21 21  GLUCOSE 84 97  BUN 3* 4*  CREATININE 0.58 0.59  CALCIUM 8.9 8.7   PT/INR No results found for this basename: LABPROT:2,INR:2 in the last 72 hours   Studies/Results: Dg Chest Port 1 View  01/25/2012  *RADIOLOGY REPORT*  Clinical Data: Chest pain.  Recent vagotomy and antrectomy on 01/23/2012.  PORTABLE CHEST - 1 VIEW  Comparison: Chest x-ray dated 01/20/2012  Findings: NG tube tip is below the diaphragm.  Surgical clips from the recent vagotomy and antrectomy.  Prominent free air in the abdomen.  Heart size and vascularity are normal.  Lungs are clear. No acute osseous abnormality.  IMPRESSION:  1.  No acute disease in the chest. 2.  Extensive free air in the abdomen, probably related to the recent antrectomy.  The patient's nurse was notified findings by Dr. Jena Gauss.  Original Report Authenticated By: Gwynn Burly, M.D.    Anti-infectives: Anti-infectives     Start     Dose/Rate Route Frequency Ordered Stop   01/26/12 0930   erythromycin 250 mg in sodium chloride 0.9 % 100 mL IVPB        250 mg 100 mL/hr over 60 Minutes Intravenous Every 8 hours 01/26/12 0832     01/23/12 1800   ceFAZolin (ANCEF) IVPB 1 g/50 mL premix        1 g 100 mL/hr over 30 Minutes Intravenous Every 6 hours 01/23/12 1601 01/24/12 0716   01/23/12 0500   ceFAZolin (ANCEF) IVPB 1 g/50 mL premix        1 g 100 mL/hr over 30 Minutes Intravenous 60 min pre-op 01/22/12 1703 01/23/12 1205           Assessment/Plan  1. S/p V&A, with B2  2. Post-op ileus  Plan: 1.  Abdomen is flat and pt is not nauseated.  Will try clamp NGT.  Hope to D/C NGT tomorrow.   2. Will try erythromycin IV 3.  If ileus continues much longer, may require TNA  LOS: 7 days    [facility] 01/27/2012

## 2012-01-27 NOTE — Progress Notes (Addendum)
Subjective: Passing flatus since yesterday.  Objective: Vital signs in last 24 hours: Temp:  [97.8 F (36.6 C)-98.3 F (36.8 C)] 97.8 F (36.6 C) (02/17 0536) Pulse Rate:  [76-87] 76  (02/17 0536) Resp:  [16-20] 18  (02/17 0554) BP: (111-129)/(72-83) 111/72 mmHg (02/17 0536) SpO2:  [96 %] 96 % (02/17 0554) Weight change:  Last BM Date: 01/22/12  Intake/Output from previous day: 02/16 0701 - 02/17 0700 In: 2380 [P.O.:30; I.V.:2100; IV Piggyback:250] Out: 1327 [Urine:2; Emesis/NG output:1325]     Physical Exam: General: Comfortable, alert, communicative, fully oriented, not short of breath at rest.  HEENT:  Mild clinical pallor, no jaundice, no conjunctival injection or discharge. Hydration status appears fair. Still has NG-T in situ, draining bile. NECK:  Supple, JVP not seen, no carotid bruits, no palpable lymphadenopathy, no palpable goiter. CHEST:  Clinically clear to auscultation, no wheezes, no crackles. HEART:  Sounds 1 and 2 heard, normal, regular, no murmurs. ABDOMEN:  Full, soft, dry, clean dressings. Bowel sounds heard. GENITALIA:  Not examined. LOWER EXTREMITIES:  No pitting edema, palpable peripheral pulses. MUSCULOSKELETAL SYSTEM:  Unremarkable. CENTRAL NERVOUS SYSTEM:  No focal neurologic deficit on gross examination.  Lab Results:  Basename 01/27/12 0817 01/26/12 0614  WBC 6.4 9.0  HGB 9.5* 9.9*  HCT 28.2* 30.1*  PLT 302 294    Basename 01/27/12 0817 01/26/12 0614  NA 140 139  K 3.6 3.8  CL 107 108  CO2 24 21  GLUCOSE 93 84  BUN 3* 3*  CREATININE 0.60 0.58  CALCIUM 9.1 8.9   No results found for this or any previous visit (from the past 240 hour(s)).   Studies/Results: Dg Chest Port 1 View  01/25/2012  *RADIOLOGY REPORT*  Clinical Data: Chest pain.  Recent vagotomy and antrectomy on 01/23/2012.  PORTABLE CHEST - 1 VIEW  Comparison: Chest x-ray dated 01/20/2012  Findings: NG tube tip is below the diaphragm.  Surgical clips from the recent  vagotomy and antrectomy.  Prominent free air in the abdomen.  Heart size and vascularity are normal.  Lungs are clear. No acute osseous abnormality.  IMPRESSION:  1.  No acute disease in the chest. 2.  Extensive free air in the abdomen, probably related to the recent antrectomy.  The patient's nurse was notified findings by Dr. Jena Gauss.  Original Report Authenticated By: Gwynn Burly, M.D.    Medications: Scheduled Meds:    . erythromycin  250 mg Intravenous Q8H  . heparin  5,000 Units Subcutaneous Q8H  . HYDROmorphone PCA 0.3 mg/mL   Intravenous Q4H  . HYDROmorphone PCA 0.3 mg/mL      . methocarbamol(ROBAXIN) IV  500 mg Intravenous Q8H  . nicotine  21 mg Transdermal Daily   Continuous Infusions:    . dextrose 5 % and 0.45 % NaCl with KCl 20 mEq/L 75 mL/hr at 01/27/12 0907  . 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 Patient is status post gastric antrectomy/Vagotomy on 01/23/12, by Dr Cyndia Bent. Appears stable on post-op day# 4, but still has post-op ileus, although, clinically, this appears to have started improving. Surgical team plan to clamp NG-T today. Managing per surgical team. Opioid analgesics, may be contributory. Dr Luz Brazen started iv Erythromycin on 01/26/12, as a prokinetic. Active Problems:  1. Nausea & vomiting: See above.  2. Epigastric pain:  See above. Comfortable on PCA pump. I strongly feel that at this point, the surgical team should consider discontinuing PCA, particularly, given tardy resolution of ileus.  3. Hypokalemia: Secondary to #1. Repleting as indicated. Resolved..  4. Musculo-skeletal upper back pain: This occurred on 01/23/12, and was positional, due to restricted  mobility, post-op. Managed with prn Robaxin. Now resolved.  5. HTN: Controlled.  6. Productive cough: Could be due to URI, and post-nasal drip. CXR of 01/25/12, was devoid of acute changes. Erythromycin started for ileus, should be adequate cover for possible URI. No longer productive, as of 01/27/12.  Comment: Disposition, per surgical team.  LOS: 7 days   Caydan Mctavish,[name] 01/27/2012, 10:16 AM  Addendum: 5:21 PM. Have discussed in detail with Dr Gaynelle Adu, surgeon. As input by medical team is minimal at this point, surgical team has kindly agreed to take over care. Patient has therefore been transferred to surgical service.

## 2012-01-27 NOTE — Progress Notes (Signed)
01/26/12 2200 Pt refused  Heparin injections due to excessive bruising on previous admission. Dahlia Byes Boschen

## 2012-01-28 LAB — BASIC METABOLIC PANEL
BUN: 3 mg/dL — ABNORMAL LOW (ref 6–23)
CO2: 23 mEq/L (ref 19–32)
Calcium: 8.9 mg/dL (ref 8.4–10.5)
Creatinine, Ser: 0.65 mg/dL (ref 0.50–1.10)
GFR calc non Af Amer: >90 mL/min (ref >90)
Glucose, Bld: 86 mg/dL (ref 70–99)

## 2012-01-28 LAB — CBC
Hemoglobin: 9.3 g/dL — ABNORMAL LOW (ref 12.0–15.0)
MCH: 30.7 pg (ref 26.0–34.0)
MCHC: 33.7 g/dL (ref 30.0–36.0)
MCV: 91.1 fL (ref 78.0–100.0)
RBC: 3.03 MIL/uL — ABNORMAL LOW (ref 3.87–5.11)

## 2012-01-28 MED ORDER — BOOST / RESOURCE BREEZE PO LIQD: 1.0000 | Freq: Two times a day (BID) | ORAL | Status: DC

## 2012-01-28 MED ORDER — PANTOPRAZOLE SODIUM 40 MG IV SOLR
40.0000 mg | Freq: Two times a day (BID) | INTRAVENOUS | Status: DC
  Administered 2012-01-28 – 2012-01-29 (×3): 40 mg via INTRAVENOUS
  Filled 2012-01-28 (×6): qty 40

## 2012-01-28 NOTE — Progress Notes (Signed)
Pt had difficulty time sleeping last night and has requested kindly for fewer people coming in to check on her during the night. [name] Barnett

## 2012-01-28 NOTE — Progress Notes (Signed)
INITIAL ADULT NUTRITION ASSESSMENT Date: 01/28/2012   Time: 9:49 AM Reason for Assessment: NPO/CL day 8  ASSESSMENT: Female 36 y.o.  Dx: Duodenal ulcer, acute with obstruction  Hx:  Past Medical History  Diagnosis Date  . Hypertension   . GERD (gastroesophageal reflux disease)   . Duodenal obstruction    Past Surgical History  Procedure Date  . Cholecystectomy 07/28/11  . Temporomandibular joint surgery     2 surgeries  . Esophagogastroduodenoscopy 10/17/2011    Procedure: ESOPHAGOGASTRODUODENOSCOPY (EGD);  Surgeon: Barrie Folk, MD;  Location: [facility] ENDOSCOPY;  Service: Endoscopy;  Laterality: N/A;  . Balloon dilation 12/31/2011    Procedure: BALLOON DILATION;  Surgeon: Barrie Folk, MD;  Location: [facility] ENDOSCOPY;  Service: Endoscopy;  Laterality: N/A;  . Vagotomy 01/23/2012    Procedure: VAGOTOMY;  Surgeon: Currie Paris, MD;  Location: [facility] OR;  Service: General;  Laterality: N/A;  Laparotomy with vagotomy.    Related Meds:     . erythromycin  250 mg Intravenous Q8H  . heparin  5,000 Units Subcutaneous Q8H  . HYDROmorphone PCA 0.3 mg/mL   Intravenous Q4H  . nicotine  21 mg Transdermal Daily     Ht: 5\' 3"  (160 cm)  Wt: 115 lb 8.3 oz (52.4 kg)  Ideal Wt: 52.7 kg % Ideal Wt: 99%  Usual Wt: 145 lbs before gallbladder removal (07/18/11) Wt Readings from Last 3 Encounters:  01/21/12 115 lb 8.3 oz (52.4 kg)  01/21/12 115 lb 8.3 oz (52.4 kg)  12/31/11 118 lb (53.524 kg)  09/12/2011 128 lbs 08/30/2011 132 lbs   % Usual Wt: 79%  Body mass index is 20.46 kg/(m^2). WNL  Food/Nutrition Related Hx: Patient reports she has been losing weight since prior to August when she had her gallbladder out. She had N/V since then with weight loss. Patient is comfortable with current weight, it is close to her ideal body weight and does not want to gain any back, just maintain.   Labs:  CMP     Component Value Date/Time   NA 141 01/28/2012 0530   K 3.9 01/28/2012 0530   CL 110  01/28/2012 0530   CO2 23 01/28/2012 0530   GLUCOSE 86 01/28/2012 0530   BUN 3* 01/28/2012 0530   CREATININE 0.65 01/28/2012 0530   CREATININE 1.18* 09/12/2011 1533   CALCIUM 8.9 01/28/2012 0530   PROT 5.3* 01/26/2012 0614   ALBUMIN 2.2* 01/26/2012 0614   AST 9 01/26/2012 0614   ALT 7 01/26/2012 0614   ALKPHOS 67 01/26/2012 0614   BILITOT 0.2* 01/26/2012 0614   GFRNONAA >90 01/28/2012 0530   GFRAA >90 01/28/2012 0530     Intake/Output Summary (Last 24 hours) at 01/28/12 0952 Last data filed at 01/27/12 1800  Gross per 24 hour  Intake   1430 ml  Output      0 ml  Net   1430 ml     Diet Order: Clear Liquid  Supplements/Tube Feeding:  none  IVF:    dextrose 5 % and 0.45 % NaCl with KCl 20 mEq/L Last Rate: 75 mL/hr at 01/27/12 1610  pantoprozole (PROTONIX) infusion Last Rate: 8 mg/hr (01/28/12 0430)    Estimated Nutritional Needs:   Kcal: 1900-2100 Protein:95-105 gm Fluid: 1.9 - 2.1 L  Patient is s/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 Previously with duodenal strictures, gastric outlet obstruction. Patient has been NPO until today waiting for bowel function to return. Patient was started on clear liquids this morning. At time  of RD visit patient had only a few sips of clear, no abdominal pain. When RD recommended a supplement, patient was adament that she did not want to gain weight back. Willing to drink [employer] to maintain lean body mass.   Patient has 20.6% weight loss in 6 months per patient report. Patient with N/V limiting PO intake to less then 75% of needs for greater then 1 month. Patient appears to have some fat and muscle wasting. Patient meets criteria for severe malnutrition in the context of chronic illness 2/2 to the above. Patient states that before she had any abdominal problems her usual weight was close to 200 lbs, and that she has had continual weight loss for greater then one year.   Due to patients NPO status for 7 days and poor po  intake prior to admission, patient is at risk for refeeding syndrome. Magnesium, sodium and potassium today were WNL.  NUTRITION DIAGNOSIS: -Inadequate oral intake (NI-2.1).  Status: Ongoing  RELATED TO: inability to eat  AS EVIDENCE BY: NPO for 7 days  MONITORING/EVALUATION(Goals): Goal: Diet will advance to be able to meet needs Monitor: Diet advance, PO intake/tolerance, weight, labs  EDUCATION NEEDS: -No education needs identified at this time  INTERVENTION: 1. Resource Breeze BID while on clear liquids, if patient remains on CL diet for more then a few days, will increase to TID 2. Recommend slow advancement of diet, continue to monitor electrolytes for possible refeeding 3. RD will follow  Dietitian [phone]  DOCUMENTATION CODES Per approved criteria  -Severe malnutrition in the context of chronic illness    [name] Barnett, [name] Barnett 01/28/2012, 9:49 AM

## 2012-01-28 NOTE — Progress Notes (Signed)
5 Days Post-Op  Subjective: Pt ok. Pain control good. Only using PCA after activity. Passing plenty flatus now. Tol NG clamp without nausea.  Objective: Vital signs in last 24 hours: Temp:  [97.5 F (36.4 C)-98.2 F (36.8 C)] 98.2 F (36.8 C) (02/18 0534) Pulse Rate:  [71-84] 71  (02/18 0534) Resp:  [16-20] 16  (02/18 0612) BP: (132-147)/(79-86) 132/79 mmHg (02/18 0534) SpO2:  [96 %-100 %] 96 % (02/18 0612) Last BM Date: 01/22/12  Intake/Output this shift:    Physical Exam: BP 132/79  Pulse 71  Temp(Src) 98.2 F (36.8 C) (Oral)  Resp 16  Ht 5\' 3"  (1.6 m)  Wt 52.4 kg (115 lb 8.3 oz)  BMI 20.46 kg/m2  SpO2 96%  LMP 12/21/2011 Lungs: CTA without w/r/r Heart: Regular Abdomen: soft, ND, appropriately tender   Incisions all c/d/i without erythema or hematoma. Ext: No edema or tenderness   Labs: CBC  Basename 01/28/12 0530 01/27/12 0817  WBC 7.4 6.4  HGB 9.3* 9.5*  HCT 27.6* 28.2*  PLT 334 302   BMET  Basename 01/28/12 0530 01/27/12 0817  NA 141 140  K 3.9 3.6  CL 110 107  CO2 23 24  GLUCOSE 86 93  BUN 3* 3*  CREATININE 0.65 0.60  CALCIUM 8.9 9.1   LFT  Basename 01/26/12 0614  PROT 5.3*  ALBUMIN 2.2*  AST 9  ALT 7  ALKPHOS 67  BILITOT 0.2*  BILIDIR --  IBILI --  LIPASE --   PT/INR No results found for this basename: LABPROT:2,INR:2 in the last 72 hours ABG No results found for this basename: PHART:2,PCO2:2,PO2:2,HCO3:2 in the last 72 hours  Studies/Results: No results found.  Assessment: Principal Problem:  *Duodenal ulcer, acute with obstruction Active Problems:  Nausea & vomiting  Epigastric pain  Hypokalemia   Procedure(s): VAGOTOMY ANTRECTOMY BILLROTH 2 Plan: DC NG and start clears. Keep PCA one more day. Encouraged OOB  LOS: 8 days    Alyse Low 01/28/2012 7:46 AM  Looks and feels well.  NG out and starting diet.

## 2012-01-29 MED ORDER — OXYCODONE-ACETAMINOPHEN 5-325 MG PO TABS
1.0000 | ORAL_TABLET | ORAL | Status: DC | PRN
  Administered 2012-01-29 – 2012-01-30 (×2): 2 via ORAL
  Filled 2012-01-29 (×2): qty 2

## 2012-01-29 MED ORDER — ACETAMINOPHEN 325 MG PO TABS: 650.0000 mg | ORAL_TABLET | ORAL | Status: DC | PRN

## 2012-01-29 NOTE — Progress Notes (Signed)
6 Days Post-Op  Subjective: On clear liquids,1160 ml recorded. Very sore, Allot of flatus, no BM so far    Objective: Vital signs in last 24 hours: Temp:  [98 F (36.7 C)-98.1 F (36.7 C)] 98 F (36.7 C) (02/19 0541) Pulse Rate:  [79-98] 81  (02/19 0541) Resp:  [17-20] 17  (02/19 0541) BP: (101-127)/(63-79) 101/63 mmHg (02/19 0541) SpO2:  [93 %-100 %] 94 % (02/19 0541) Last BM Date: 01/22/12  Intake/Output from previous day: 02/18 0701 - 02/19 0700 In: 1160 [P.O.:1160] Out: -  Intake/Output this shift:    PE:  Alert, Chest: clear. AbdL soft, not distended, tender/sore.  Bowel sounds hypoactive.  Incision looks great.  Lab Results:   Basename 01/28/12 0530 01/27/12 0817  WBC 7.4 6.4  HGB 9.3* 9.5*  HCT 27.6* 28.2*  PLT 334 302    BMET  Basename 01/28/12 0530 01/27/12 0817  NA 141 140  K 3.9 3.6  CL 110 107  CO2 23 24  GLUCOSE 86 93  BUN 3* 3*  CREATININE 0.65 0.60  CALCIUM 8.9 9.1   PT/INR No results found for this basename: LABPROT:2,INR:2 in the last 72 hours   Studies/Results: No results found.  Anti-infectives: Anti-infectives     Start     Dose/Rate Route Frequency Ordered Stop   01/26/12 0930   erythromycin 250 mg in sodium chloride 0.9 % 100 mL IVPB        250 mg 100 mL/hr over 60 Minutes Intravenous Every 8 hours 01/26/12 0832     01/23/12 1800   ceFAZolin (ANCEF) IVPB 1 g/50 mL premix        1 g 100 mL/hr over 30 Minutes Intravenous Every 6 hours 01/23/12 1601 01/24/12 0716   01/23/12 0500   ceFAZolin (ANCEF) IVPB 1 g/50 mL premix        1 g 100 mL/hr over 30 Minutes Intravenous 60 min pre-op 01/22/12 1703 01/23/12 1205         Current Facility-Administered Medications  Medication Dose Route Frequency Provider Last Rate Last Dose  . dextrose 5 % and 0.45 % NaCl with KCl 20 mEq/L infusion   Intravenous Continuous Atilano Ina, MD 75 mL/hr at 01/29/12 0033    . diphenhydrAMINE (BENADRYL) injection 12.5 mg  12.5 mg Intravenous Q6H PRN  Currie Paris, MD       Or  . diphenhydrAMINE (BENADRYL) 12.5 MG/5ML elixir 12.5 mg  12.5 mg Oral Q6H PRN Currie Paris, MD      . erythromycin 250 mg in sodium chloride 0.9 % 100 mL IVPB  250 mg Intravenous Q8H Almond Lint, MD   250 mg at 01/29/12 0140  . feeding supplement (RESOURCE BREEZE) liquid 1 Container  1 Container Oral BID WC Rudean Haskell, RD      . heparin injection 5,000 Units  5,000 Units Subcutaneous Q8H Currie Paris, MD   5,000 Units at 01/23/12 2215  . HYDROmorphone (DILAUDID) injection 0.5-1 mg  0.5-1 mg Intravenous Q4H PRN Zannie Cove, MD   1 mg at 01/21/12 2331  . HYDROmorphone (DILAUDID) PCA injection 0.3 mg/mL   Intravenous Q4H Currie Paris, MD   1.19 mg at 01/29/12 5784  . menthol-cetylpyridinium (CEPACOL) lozenge 3 mg  1 lozenge Oral PRN Letha Cape, PA      . naloxone [facility]) injection 0.4 mg  0.4 mg Intravenous PRN Currie Paris, MD       And  . sodium chloride 0.9 % injection  9 mL  9 mL Intravenous PRN Currie Paris, MD      . nicotine (NICODERM CQ - dosed in mg/24 hours) patch 21 mg  21 mg Transdermal Daily Manson Passey, MD   21 mg at 01/28/12 0932  . ondansetron (ZOFRAN) injection 4 mg  4 mg Intravenous Q6H PRN Currie Paris, MD      . pantoprazole (PROTONIX) injection 40 mg  40 mg Intravenous Q12H Sherrie George, [place]   40 mg at 01/28/12 2205  . phenol (CHLORASEPTIC) mouth spray 1 spray  1 spray Mouth/Throat PRN Letha Cape, PA   1 spray at 01/25/12 [phone]  . DISCONTD: pantoprazole (PROTONIX) 80 mg in sodium chloride 0.9 % 250 mL infusion  8 mg/hr Intravenous Continuous Zannie Cove, MD 25 mL/hr at 01/28/12 0430 8 mg/hr at 01/28/12 0430    Assessment/Plan Duodenal ulcer, acute with obstruction (01/23/12 DR. Streck) POD6 Active Problems:  Nausea & vomiting  Epigastric pain  Hypokalemia   Procedure(s):  VAGOTOMY  ANTRECTOMY  BILLROTH 2  Plan: Will leave on clears, for now, discussed Supp, but pt doesn't  want one.  Will start weaning PCA. Seen by DR. Biagio Quint, go to post gastrectomy diet, D/c PCA, saline lock IV.                                                                                                                                                                                                        LOS: 9 days    JENNINGS,WILLARD 01/29/2012  She looks and feels well.  Tolerating diet.  Will advance and convert to PO pain meds.  Should be ready for discharge to home soon.

## 2012-01-29 NOTE — Progress Notes (Signed)
0900 Dilaudid PCA pump d/c per MD order, wasted 2cc of dialudid witnessed by myself and Ledon Snare.  Bonney Leitz RN

## 2012-01-30 MED ORDER — OXYCODONE-ACETAMINOPHEN 5-325 MG PO TABS: 1.0000 | ORAL_TABLET | ORAL | Status: AC | PRN

## 2012-01-30 MED ORDER — NICOTINE 21 MG/24HR TD PT24: 1.0000 | MEDICATED_PATCH | Freq: Every day | TRANSDERMAL | Status: AC

## 2012-01-30 MED ORDER — ACETAMINOPHEN 325 MG PO TABS: 650.0000 mg | ORAL_TABLET | ORAL | Status: AC | PRN

## 2012-01-30 MED ORDER — BOOST / RESOURCE BREEZE PO LIQD: 1.0000 | Freq: Two times a day (BID) | ORAL | Status: DC

## 2012-01-30 MED ORDER — PANTOPRAZOLE SODIUM 40 MG PO TBEC: 40.0000 mg | DELAYED_RELEASE_TABLET | Freq: Two times a day (BID) | ORAL | Status: DC

## 2012-01-30 NOTE — Discharge Instructions (Signed)
[facility]      [facility] [phone]  OPEN ABDOMINAL SURGERY: POST OP INSTRUCTIONS  Always review your discharge instruction sheet given to you by the facility where your surgery was performed.  IF YOU HAVE DISABILITY OR FAMILY LEAVE FORMS, YOU MUST BRING THEM TO THE OFFICE FOR PROCESSING.  PLEASE DO NOT GIVE THEM TO YOUR DOCTOR.  1. A prescription for pain medication may be given to you upon discharge.  Take your pain medication as prescribed, if needed.  If narcotic pain medicine is not needed, then you may take acetaminophen (Tylenol) or ibuprofen (Advil) as needed. 2. Take your usually prescribed medications unless otherwise directed. 3. If you need a refill on your pain medication, please contact your pharmacy. They will contact our office to request authorization.  Prescriptions will not be filled after 5pm or on week-ends. 4. You should follow a light diet the first few days after arrival home, such as soup and crackers, pudding, etc.unless your doctor has advised otherwise. A high-fiber, low fat diet can be resumed as tolerated.   Be sure to include lots of fluids daily. Most patients will experience some swelling and bruising on the chest and neck area.  Ice packs will help.  Swelling and bruising can take several days to resolve 5. Most patients will experience some swelling and bruising in the area of the incision. Ice pack will help. Swelling and bruising can take several days to resolve..  6. It is common to experience some constipation if taking pain medication after surgery.  Increasing fluid intake and taking a stool softener will usually help or prevent this problem from occurring.  A mild laxative (Milk of Magnesia or Miralax) should be taken according to package directions if there are no bowel movements after 48 hours. 7.  You may have steri-strips (small skin tapes) in place directly over the incision.  These strips should be left on the skin for 7-10 days.  If your  surgeon used skin glue on the incision, you may shower in 24 hours.  The glue will flake off over the next 2-3 weeks.  Any sutures or staples will be removed at the office during your follow-up visit. You may find that a light gauze bandage over your incision may keep your staples from being rubbed or pulled. You may shower and replace the bandage daily. 8. ACTIVITIES:  You may resume regular (light) daily activities beginning the next day--such as daily self-care, walking, climbing stairs--gradually increasing activities as tolerated.  You may have sexual intercourse when it is comfortable.  Refrain from any heavy lifting or straining until approved by your doctor. a. You may drive when you no longer are taking prescription pain medication, you can comfortably wear a seatbelt, and you can safely maneuver your car and apply brakes b. Return to Work: ___________________________________ 9. You should see your doctor in the office for a follow-up appointment approximately two weeks after your surgery.  Make sure that you call for this appointment within a day or two after you arrive home to insure a convenient appointment time. OTHER INSTRUCTIONS:  _____________________________________________________________ _____________________________________________________________  WHEN TO CALL YOUR DOCTOR: 1. Fever over 101.0 2. Inability to urinate 3. Nausea and/or vomiting 4. Extreme swelling or bruising 5. Continued bleeding from incision. 6. Increased pain, redness, or drainage from the incision. 7. Difficulty swallowing or breathing 8. Muscle cramping or spasms. 9. Numbness or tingling in hands or feet or around lips.  The clinic staff is available to   answer your questions during regular business hours.  Please don't hesitate to call and ask to speak to one of the nurses if you have concerns.  For further questions, please visit [email or web address]  Bilroth II Vagotomy, and Antrectomy    Care After Refer to this sheet in the next few weeks. These discharge instructions provide you with general information on caring for yourself after you leave the hospital. Your caregiver may also give you specific instructions. Your treatment has been planned according to the most current medical practices available, but unavoidable complications sometimes occur. If you have any problems or questions after discharge, please call your caregiver. Recovery from antrectomy, partial, or subtotal gastrectomy can take up to 8 weeks or more. Allow time to heal and pay close attention to your diet during recovery. Most patients adjust well over time. HOME CARE INSTRUCTIONS  Everyone recovers at a different pace. Proper self-care will help you to recover fully and return to your normal activities.  Follow your caregiver's instructions.   Rest often, but try to move about each day.   Gradually add light activity to your daily routine.   Take pain or other medicines as directed. Do not drive if you are taking narcotics for pain.   Keep the incision area(s) clean, dry, and protected. Follow your caregiver's instructions for incision care and changing your bandages (dressings).   Follow up with your caregiver as directed. Regular blood tests to check for vitamin deficiencies and vitamin B injections are sometimes necessary.   You may develop a scar(s) at the incision site(s). Follow your caregiver's instructions for scar care after the incision area has healed.   You may gradually resume most activities, including sexual activity, as directed.   Menstrual cycles are commonly disrupted after surgery. You may be off schedule for up to a year.   Always discuss your medicines with your caregiver before taking them.   If you smoke, quit. Smoking slows the healing process.  Special diet instructions:  Eat small amounts of food throughout the day instead of large meals. If too much food moves quickly through  your system, this can lead to "dumping syndrome" (cramps, nausea, dizziness, discomfort).   Your caregiver will help you understand the best foods to eat to prevent complications. Eat foods high in calcium, iron, vitamin C, and vitamin D.   It is common to experience heartburn and/or diarrhea at first. Ask your doctor about foods or medicines to help lessen these symptoms.   Keeping a food diary may help you to know which foods to avoid.   Follow all of your caregiver's dietary instructions.  SEEK MEDICAL CARE IF:   You are having trouble caring for your incision(s).   Your pain is not controlled by the medicines you have been given.   You have an oral temperature above 102 F (38.9 C).   You develop chills.   You are feeling sick to your stomach (nauseous).   You are throwing up (vomiting).   You notice bleeding, skin irritation, drainage, redness, or pain.   You have abdominal pain, bloating, pressure, or cramping.   Your stools do not become firmer over time.   You have frequent diarrhea or heartburn.   You experience lightheadedness.   You experience mental confusion.   You have other new symptoms.   You have questions or concerns.  SEEK IMMEDIATE MEDICAL CARE IF:   Abdominal pain does not go away or becomes severe.   You have an oral temperature  above 102 F (38.9 C), not controlled by medicine.   Repeated vomiting occurs.   You develop an irregular heartbeat or chest pain.   There is swelling or pain in your legs, calves, or feet.  MAKE SURE YOU:   Understand these instructions.   Will watch your condition.   Will get help right away if you are not doing well or get worse.  Document Released: 02/20/2010 Document Revised: 08/08/2011 Document Reviewed: 02/20/2010 [facility] Patient Information 2012 [place], [place].

## 2012-01-30 NOTE — Discharge Summary (Signed)
Physician Discharge Summary  Patient ID: [name] Barnett MRN: [id number] DOB/AGE: 12/17/1975 36 y.o.  Admit date: 01/20/2012 Discharge date: 01/30/2012  Admission Diagnoses: Nausea, vomiting, with history of duodenal ulcer/obstruction. Weight Loss Tobacco use Discharge Diagnoses:  Principal Problem:  *Duodenal ulcer, acute with obstruction Active Problems:  Nausea & vomiting  Epigastric pain  Hypokalemia Tobacco use Protein Calorie Malnutrition PROCEDURES: Vagotomy, Antrectomy, Bilroth II 01/23/12 Dr. Cyndia Bent    Consults: GI: Dr. Willis Modena.  Hospital Course: This 36 year old female who was admitted for nausea vomiting failure to thrive and weakness. She has a history of NSAID use for headaches and right-sided pain. 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 Problems been ongoing since November of 2012th. She recently underwent endoscopy by Dr. Madilyn Fireman 3 weeks prior to admission which showed a persistent post valvular stenosis which was dilated from 8 mm to 10 mm with a TTS balloon catheter. She did well for approximately one week and then developed symptoms again. She lost approximately 15 pounds over the past few weeks. She was seen by Dr. Dulce Sellar it was their opinion further endoscopic attempts at dilating the stricture would be unhelpful anterior high-risk for perforation. Dr. Jamey Ripa was consulted, and it was ultimately determined that we should go forward with surgical treatment. On 01/20/2012 she was taken the operating room, and underwent vagotomy and antrectomy with a Billroth II gastrojejunostomy. Postoperatively she did well. A postoperative ileus for the first 5 days. Her NG was removed she was started on clear liquid, and advanced to a gastrectomy diet on her sixth postoperative day. She had a bowel movement yesterday, she's tolerating the stricture may diet  well, and is anxious to go home. Her incision is healing well protein, and albumin value to a low but her labs are otherwise normal. We will plan to discharge her home today. Will remove every other staple and Steri-Strip her incision. We'll have her come back to the office for the remaining staples to be removed in 10-14 days. Followup with Dr. Jamey Ripa: 2 weeks Primary care: Her current primary care giver is no longer available. She is instructed to call [facility] family practice for primary care followup. Followup with Dr. Dorena Cookey: make an appointment in 2 weeks. Condition on discharge: Improved   Disposition: 01-Home or Self Care  Discharge Orders    Future Appointments: Provider: Department: Dept Phone: Center:   02/04/2012 9:00 AM [facility] [phone] None     Medication List  As of 01/30/2012  9:44 AM   STOP taking these medications         lisinopril 20 MG tablet         TAKE these medications         acetaminophen 325 MG tablet   Commonly known as: TYLENOL   Take 2 tablets (650 mg total) by mouth every 4 (four) hours as needed.      feeding supplement Liqd   Take 1 Container by mouth 2 (two) times daily with a meal.      nicotine 21 mg/24hr patch   Commonly known as: NICODERM CQ - dosed in mg/24 hours   Place 1 patch onto the skin daily.      oxyCODONE-acetaminophen 5-325 MG per tablet   Commonly known as: PERCOCET   Take 1-2 tablets by mouth every 4 (four) hours as needed.      pantoprazole 40 MG  tablet   Commonly known as: PROTONIX   Take 1 tablet (40 mg total) by mouth 2 (two) times daily before a meal.           Follow-up Information    Follow up with Currie Paris, MD. (Staple removal 02/04/12 at 9:00AM)    Contact information:   [facility] [address] [place] [address] [phone]       Follow up with Currie Paris, MD. Schedule an appointment as soon  as possible for a visit in 2 weeks.   Contact information:   [employer] [address] [place] [address] [phone]       Follow up with [facility]. (Call for a follow up appointment and let them restart your blood pressure medicine .)       Follow up with HAYES,JOHN C, MD. Schedule an appointment as soon as possible for a visit in 2 weeks.   Contact information:   [address] [employer], [place]. [place] [address] [phone]          Signed: Sherrie George 01/30/2012, 9:44 AM

## 2012-01-30 NOTE — Progress Notes (Signed)
7 Days Post-Op  Subjective: Afebrile, + BM, labs all look good, on gastrectomy diet.  Getting around well.  Wants to go home.  Objective: Vital signs in last 24 hours: Temp:  [97.5 F (36.4 C)-98.1 F (36.7 C)] 97.7 F (36.5 C) (02/20 0603) Pulse Rate:  [76-92] 76  (02/20 0603) Resp:  [20] 20  (02/20 0603) BP: (101-131)/(67-85) 131/85 mmHg (02/20 0603) SpO2:  [97 %-98 %] 97 % (02/20 0603) Last BM Date: 01/22/12  Intake/Output from previous day: 02/19 0701 - 02/20 0700 In: 3681.3 [P.O.:1640; I.V.:2041.3] Out: -  Intake/Output this shift:    PE:  Alert walking halls earlier, Chest: clear, Abd; soft eating regular diet, incision looks good.  Lab Results:   [facility] 01/28/12 0530  WBC 7.4  HGB 9.3*  HCT 27.6*  PLT 334    BMET  Basename 01/28/12 0530  NA 141  K 3.9  CL 110  CO2 23  GLUCOSE 86  BUN 3*  CREATININE 0.65  CALCIUM 8.9     Lab 01/26/12 0614 01/25/12 0630 01/24/12 0630  AST 9 13 18   ALT 7 8 14   ALKPHOS 67 69 66  BILITOT 0.2* 0.2* 0.2*  PROT 5.3* 5.5* 5.6*  ALBUMIN 2.2* 2.3* 2.7*    PT/INR No results found for this basename: LABPROT:2,INR:2 in the last 72 hours   Studies/Results: No results found.  Anti-infectives: Anti-infectives     Start     Dose/Rate Route Frequency Ordered Stop   01/26/12 0930   erythromycin 250 mg in sodium chloride 0.9 % 100 mL IVPB        250 mg 100 mL/hr over 60 Minutes Intravenous Every 8 hours 01/26/12 0832     01/23/12 1800   ceFAZolin (ANCEF) IVPB 1 g/50 mL premix        1 g 100 mL/hr over 30 Minutes Intravenous Every 6 hours 01/23/12 1601 01/24/12 0716   01/23/12 0500   ceFAZolin (ANCEF) IVPB 1 g/50 mL premix        1 g 100 mL/hr over 30 Minutes Intravenous 60 min pre-op 01/22/12 1703 01/23/12 1205         Current Facility-Administered Medications  Medication Dose Route Frequency Provider Last Rate Last Dose  . acetaminophen (TYLENOL) tablet 650 mg  650 mg Oral Q4H PRN Sherrie George, PA      .  dextrose 5 % and 0.45 % NaCl with KCl 20 mEq/L infusion   Intravenous Continuous Atilano Ina, MD 75 mL/hr at 01/29/12 0033    . erythromycin 250 mg in sodium chloride 0.9 % 100 mL IVPB  250 mg Intravenous Q8H Almond Lint, MD   250 mg at 01/30/12 0218  . feeding supplement (RESOURCE BREEZE) liquid 1 Container  1 Container Oral BID WC Rudean Haskell, RD      . heparin injection 5,000 Units  5,000 Units Subcutaneous Q8H Currie Paris, MD   5,000 Units at 01/23/12 2215  . HYDROmorphone (DILAUDID) injection 0.5-1 mg  0.5-1 mg Intravenous Q4H PRN Zannie Cove, MD   1 mg at 01/21/12 2331  . menthol-cetylpyridinium (CEPACOL) lozenge 3 mg  1 lozenge Oral PRN Letha Cape, PA      . nicotine (NICODERM CQ - dosed in mg/24 hours) patch 21 mg  21 mg Transdermal Daily Manson Passey, MD   21 mg at 01/29/12 0958  . oxyCODONE-acetaminophen (PERCOCET) 5-325 MG per tablet 1-2 tablet  1-2 tablet Oral Q4H PRN Sherrie George, PA   2 tablet at  01/29/12 1941  . pantoprazole (PROTONIX) injection 40 mg  40 mg Intravenous Q12H Sherrie George, [place]   40 mg at 01/29/12 2326  . phenol (CHLORASEPTIC) mouth spray 1 spray  1 spray Mouth/Throat PRN Letha Cape, PA   1 spray at 01/25/12 [phone]  . DISCONTD: diphenhydrAMINE (BENADRYL) 12.5 MG/5ML elixir 12.5 mg  12.5 mg Oral Q6H PRN Currie Paris, MD      . DISCONTD: diphenhydrAMINE (BENADRYL) injection 12.5 mg  12.5 mg Intravenous Q6H PRN Currie Paris, MD      . DISCONTD: HYDROmorphone (DILAUDID) PCA injection 0.3 mg/mL   Intravenous Q4H Currie Paris, MD   1.39 mg at 01/29/12 0800  . DISCONTD: naloxone (NARCAN) injection 0.4 mg  0.4 mg Intravenous PRN Currie Paris, MD      . DISCONTD: ondansetron [facility] [address]) injection 4 mg  4 mg Intravenous Q6H PRN Currie Paris, MD      . DISCONTD: sodium chloride 0.9 % injection 9 mL  9 mL Intravenous PRN Currie Paris, MD        Assessment/Plan Duodenal ulcer, acute with obstruction (01/23/12 DR.  Streck) POD6  Active Problems:  Nausea & vomiting  Epigastric pain  Hypokalemia  Procedure(s):  VAGOTOMY  ANTRECTOMY  BILLROTH 2   Plan:  I hope we can get her home today.  Will discuss with DR. Layton. I will ask dietician to teach Gastrectomy and high protein diet.    LOS: 10 days    [name] Barnett 01/30/2012

## 2012-01-30 NOTE — Plan of Care (Signed)
Problem: Food- and Nutrition-Related Knowledge Deficit (NB-1.1) Goal: Nutrition education Formal process to instruct or train a patient/client in a skill or to impart knowledge to help patients/clients voluntarily manage or modify food choices and eating behavior to maintain or improve health.  Outcome: Completed/Met Date Met:  01/30/12 RD consulted to speak with patient prior to d/c regarding her diet. Patient had a gastrectomy at this admission. Patient was unfamiliar with this diet, RD provided hand outs and verbal instruction. Patient instructed to eat small frequent meals, avoid high sugar foods, drink between meals, and avoid high fiber foods. Patient also received education regarding how to increase protein in diet to promote wound healing and prevent further weight loss. Patient has a large amount of weight loss PTA, BMI is now 20.5 kg/(m^2), no new weights since admission. For more details please see full assessment on 2/18. Patient had no additional questions, contact information was given. No additional interventions at this time.   [name] [phone]

## 2012-01-31 NOTE — Progress Notes (Signed)
Left before I could see her 

## 2012-02-04 ENCOUNTER — Ambulatory Visit (INDEPENDENT_AMBULATORY_CARE_PROVIDER_SITE_OTHER): Payer: BC Managed Care – PPO | Admitting: General Surgery

## 2012-02-04 DIAGNOSIS — Z4802 Encounter for removal of sutures: Secondary | ICD-10-CM

## 2012-02-04 NOTE — Patient Instructions (Signed)
Call with any changes to wound. Increased redness. Drainage or fevers. Keep follow up with Dr Jamey Ripa.

## 2012-02-04 NOTE — Progress Notes (Signed)
Patient comes in with midline abdominal wound. Wound looks good. Staples were removed and steri strips placed. Patient instructed on signs of wound infection. She will keep her appt with Dr Jamey Ripa and call with any problems.

## 2012-02-08 ENCOUNTER — Ambulatory Visit (INDEPENDENT_AMBULATORY_CARE_PROVIDER_SITE_OTHER): Payer: BC Managed Care – PPO | Admitting: Surgery

## 2012-02-08 ENCOUNTER — Encounter (INDEPENDENT_AMBULATORY_CARE_PROVIDER_SITE_OTHER): Payer: Self-pay | Admitting: Surgery

## 2012-02-08 VITALS — BP 110/68 | HR 76 | Resp 16 | Ht 63.25 in | Wt 114.0 lb

## 2012-02-08 DIAGNOSIS — Z9889 Other specified postprocedural states: Secondary | ICD-10-CM

## 2012-02-08 DIAGNOSIS — K311 Adult hypertrophic pyloric stenosis: Secondary | ICD-10-CM

## 2012-02-08 NOTE — Progress Notes (Signed)
NAME: [name] Barnett                                            DOB: 1976-07-30 DATE: 02/08/2012                                                  MRN: [id number]  CC: Post op   HPI: This patient comes in for post op follow-up.Sheunderwent V&A with B II on 01/23/2012. She feels that she is doing well.She is eating much better than preop. No nausea. Bowels working OK. Wants to go back to work soon PE: General: The patient appears to be healthy, NAD Abd is soft and benign. Wound is healingl  DATA REVIEWED: NO new data  IMPRESSION: The patient is doing well S/P V&A.    PLAN: RTC four weeks Re check gastrin - was a little elevated in hosptial May resume work in two weeks

## 2012-02-08 NOTE — Patient Instructions (Signed)
Avoid lifting more than 15 pounds for another 4 weeks. See me again in four weeks. You may return to work in two weeks. We need to check some blood work on you today, so go by the lab after you leave here

## 2012-02-26 ENCOUNTER — Telehealth (INDEPENDENT_AMBULATORY_CARE_PROVIDER_SITE_OTHER): Payer: Self-pay

## 2012-02-26 NOTE — Telephone Encounter (Signed)
Prior authorization paperwork completed by Dr Jamey Ripa and faxed to [employer] at [phone]. Called patient and received voicemail. Made her aware paperwork has been completed and faxed from our standpoint and to call if any problems.

## 2012-02-26 NOTE — Telephone Encounter (Signed)
Patient went to refill protonix and insurance will not pay unless insurance is contacted due to dosage amount. Phone number is [phone] to insurance company. Patient wants you to contact her [phone] after taken care of.

## 2012-03-10 ENCOUNTER — Encounter (INDEPENDENT_AMBULATORY_CARE_PROVIDER_SITE_OTHER): Payer: BC Managed Care – PPO | Admitting: Surgery

## 2012-03-11 ENCOUNTER — Encounter (INDEPENDENT_AMBULATORY_CARE_PROVIDER_SITE_OTHER): Payer: Self-pay | Admitting: Surgery

## 2013-07-14 IMAGING — CT CT ABD-PELV W/ CM
2 of 4 series · 16 of 46 positions shown, 18 images · IV contrast (APPLIED)
Comparison: CT abdomen pelvis - 08/07/2011

CLINICAL DATA: Right upper quadrant abdominal pain post
cholecystectomy performed 07/30/2011

CT ABDOMEN AND PELVIS WITH CONTRAST
TECHNIQUE: Multidetector CT imaging of the abdomen and pelvis was
performed following the standard protocol during bolus
administration of intravenous contrast.
Contrast: 100mL OMNIPAQUE IOHEXOL 300 MG/ML IV SOLN

[Series 2: rtn a/p with · axial · 0.81mm/px · z∈[+750,+1154]mm · 13 of 89 slices shown, 15 images]
[im 4/89  soft-tissue]
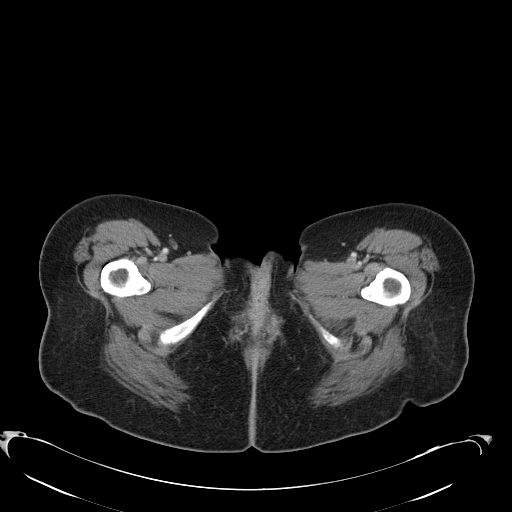
[im 4/89  bone]
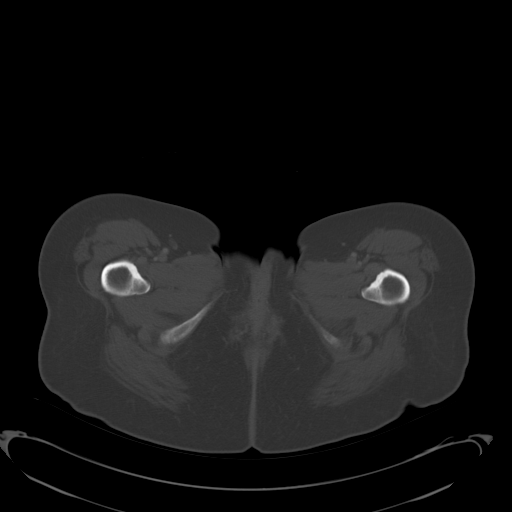
[im 11/89  soft-tissue]
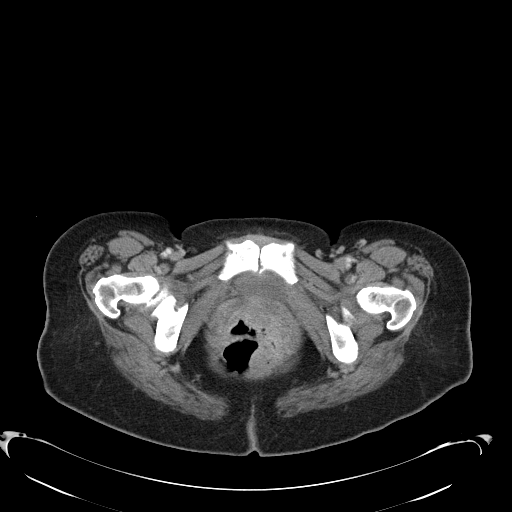
[im 18/89  soft-tissue]
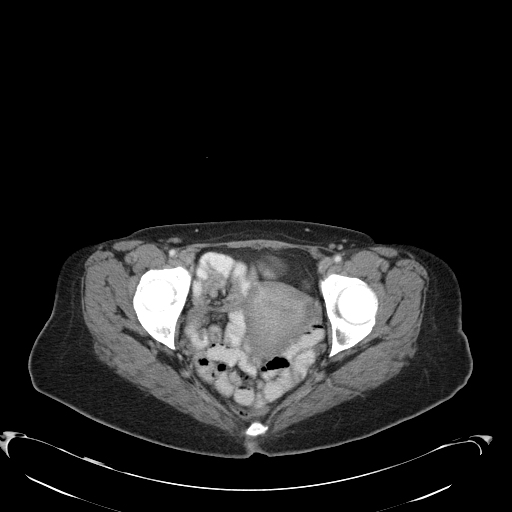
[im 25/89  soft-tissue]
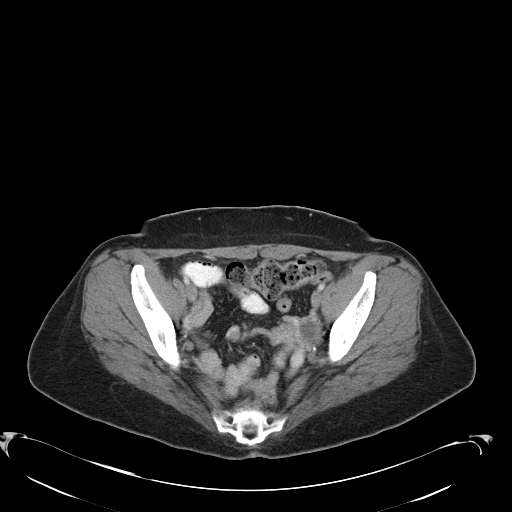
[im 32/89  soft-tissue]
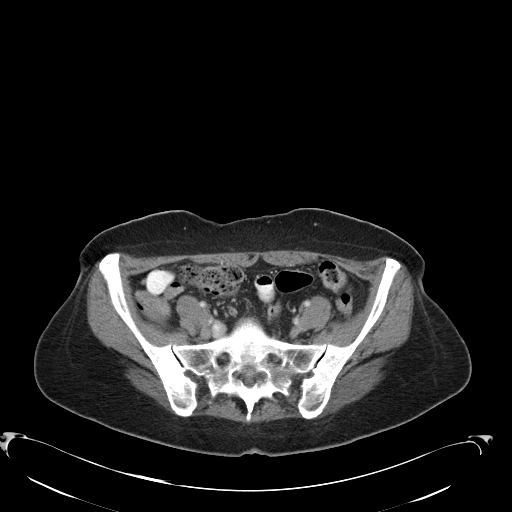
[im 39/89  soft-tissue]
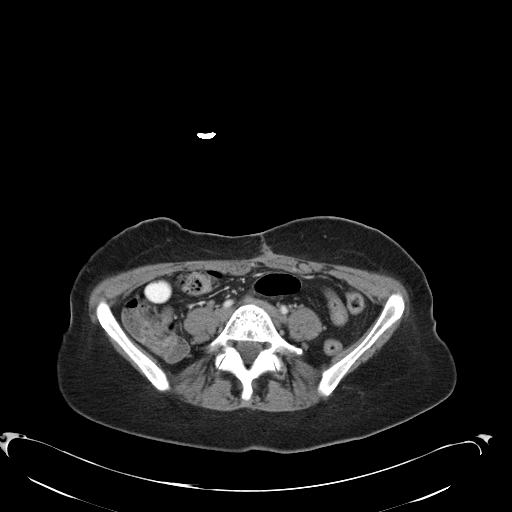
[im 46/89  soft-tissue]
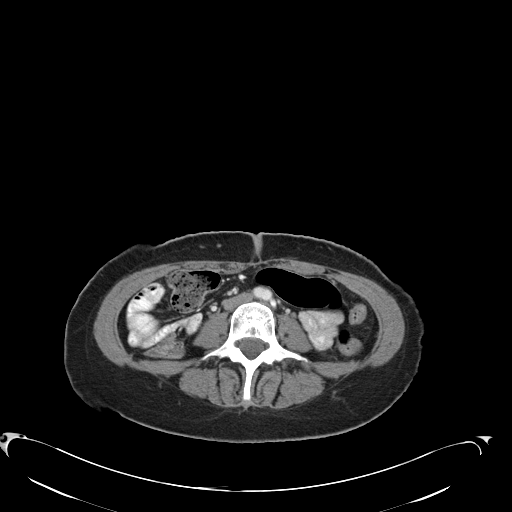
[im 50/89  soft-tissue]
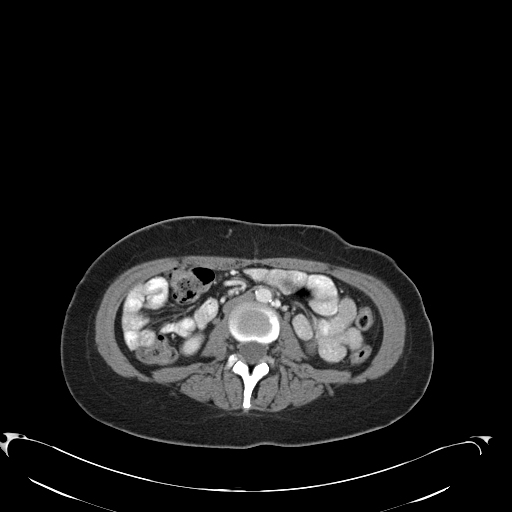
[im 57/89  soft-tissue]
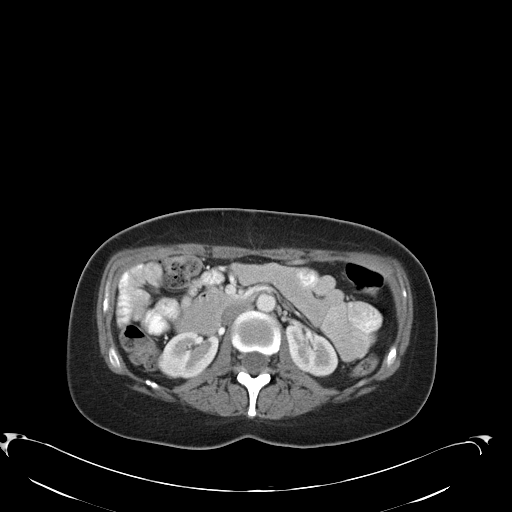
[im 57/89  bone]
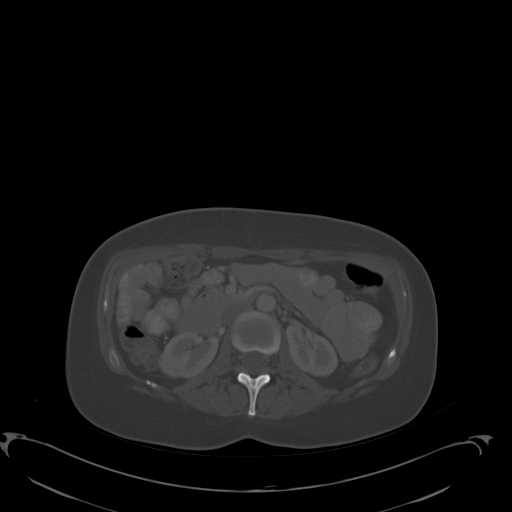
[im 64/89  soft-tissue]
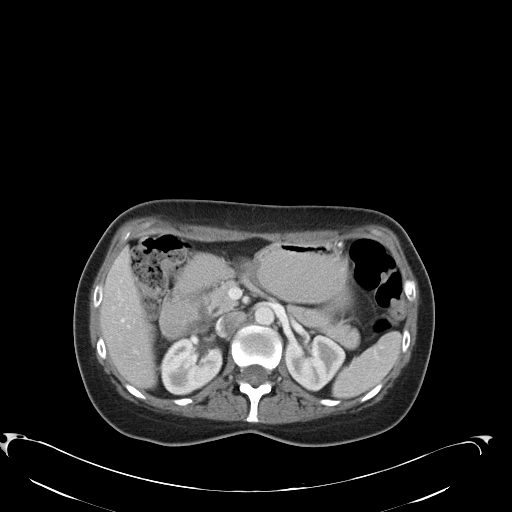
[im 71/89  soft-tissue]
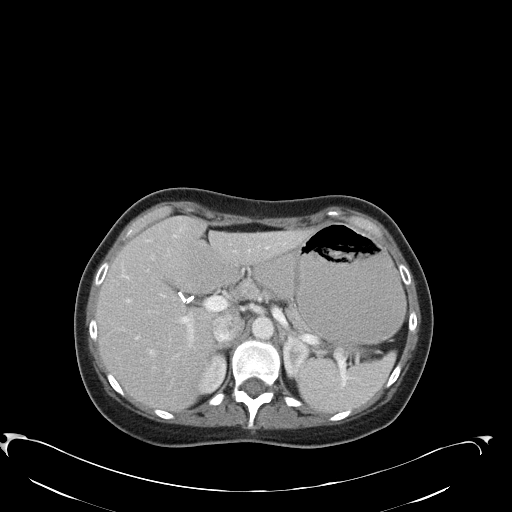
[im 78/89  soft-tissue]
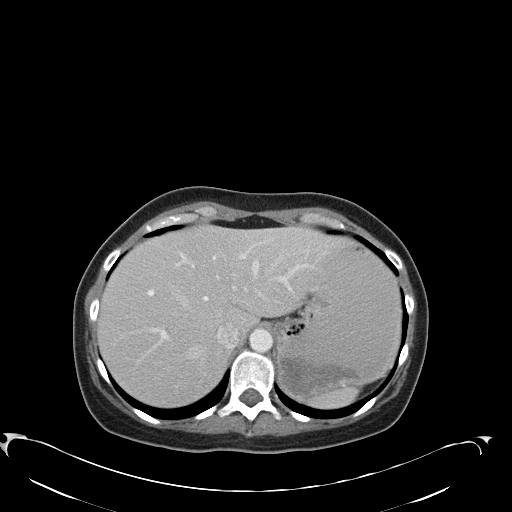
[im 85/89  soft-tissue]
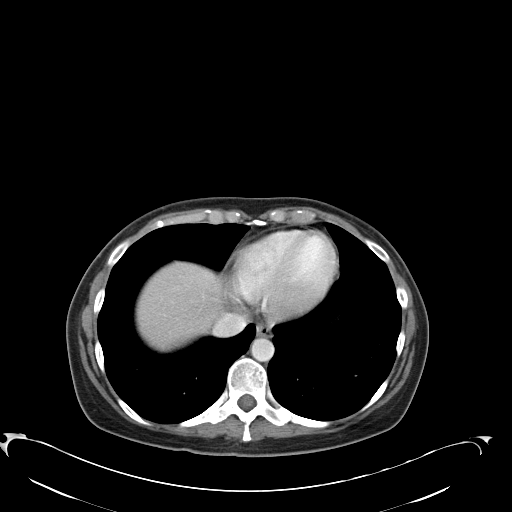

[Series 602: cor · coronal · 0.87mm/px · 3 of 96 slices shown]
[im 32/96  soft-tissue]
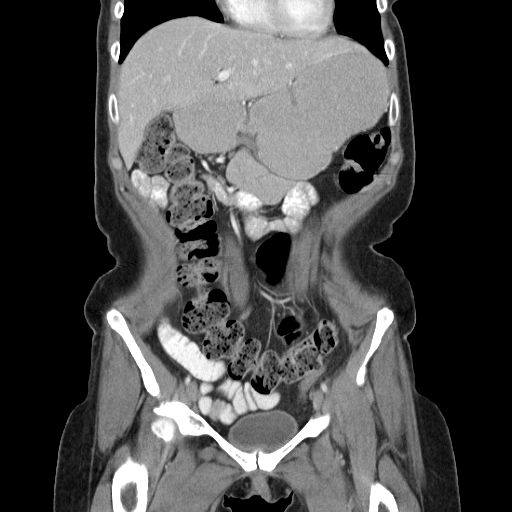
[im 43/96  soft-tissue]
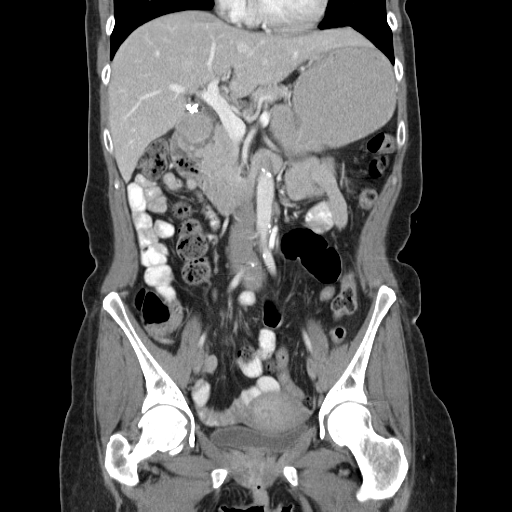
[im 53/96  soft-tissue]
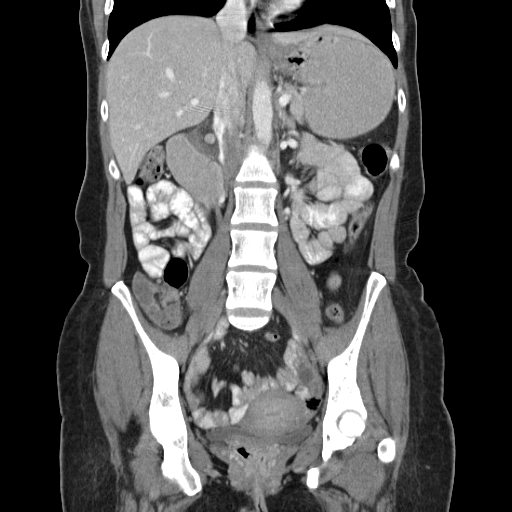

[16 of 46 positions shown; findings below may reference images not displayed]

FINDINGS: Normal hepatic contour.  No discrete hepatic lesion.  Post
cholecystectomy.  Interval resolution of stranding within the
cholecystectomy bed. No intra or extrahepatic biliary ductal
dilatation.  Hepatic vasculature is patent.  No ascites.

There is symmetric enhancement of the bilateral kidneys.  No
evidence of urinary obstruction.  No discrete renal lesions.
Bilateral adrenal glands are normal.

There is mild enlargement of the duodenal bulb and descending
portion of doudenum, without evidence of enteric obstruction.  The
sigmoid colon is noted to be redundant.  The bowel is otherwise
normal in course and caliber without wall thickening or evidence of
obstruction.  Normal appendix.  No pneumoperitoneum, pneumatosis or
portal venous gas.  There is scattered minimal atherosclerotic
calcifications of a normal caliber abdominal aorta.  The major
branch vessels of the abdominal aorta, including the IMA, are
patent.  No retroperitoneal, mesenteric, pelvic or inguinal
lymphadenopathy.

Left-sided approximately 1.7 cm adnexal cyst, likely physiologic.
No discrete right-sided adnexal lesions.  Small amount of fluid
within the endometrial canal, likely physiologic.  No free fluid
within the pelvis.

Limited visualization of the lower thorax is negative for focal
airspace opacity.  Normal heart size.  No pericardial effusion.

No acute aggressive osseous abnormalities.
IMPRESSION: 1.  Post cholecystectomy without evidence of complication.  No
intra or extra panic biliary ductal dilatation.  Interval
resolution of small amount with fluid within the cholecystectomy
bed and tiny amount of residual pneumoperitoneum.
2.  Mild dilatation of the duodenal bulb and descending portion of
the duodenum of uncertain clinical significance.  No evidence of
enteric obstruction.  Further evaluation may be performed with an
upper GI or endoscopy as clinically indicated.
3. Likely physiologic 1.7 cm left-sided adnexal cyst.

## 2017-04-07 ENCOUNTER — Emergency Department (HOSPITAL_COMMUNITY): Payer: BLUE CROSS/BLUE SHIELD

## 2017-04-07 ENCOUNTER — Inpatient Hospital Stay (HOSPITAL_COMMUNITY): Payer: BLUE CROSS/BLUE SHIELD | Admitting: Anesthesiology

## 2017-04-07 ENCOUNTER — Encounter (HOSPITAL_COMMUNITY): Payer: Self-pay

## 2017-04-07 ENCOUNTER — Encounter (HOSPITAL_COMMUNITY)
Admission: EM | Disposition: A | Discharge status: Rehabilitation Facility | Payer: Self-pay | Source: Home / Self Care | Attending: Neurosurgery

## 2017-04-07 ENCOUNTER — Inpatient Hospital Stay (HOSPITAL_COMMUNITY)
Admission: EM | Admit: 2017-04-07 | Discharge: 2017-04-19 | DRG: 021 | Disposition: A | Discharge status: Rehabilitation Facility | Payer: BLUE CROSS/BLUE SHIELD | Attending: Neurosurgery | Admitting: Neurosurgery

## 2017-04-07 ENCOUNTER — Inpatient Hospital Stay (HOSPITAL_COMMUNITY): Payer: BLUE CROSS/BLUE SHIELD

## 2017-04-07 DIAGNOSIS — I10 Essential (primary) hypertension: Secondary | ICD-10-CM | POA: Diagnosis present

## 2017-04-07 DIAGNOSIS — R0603 Acute respiratory distress: Secondary | ICD-10-CM

## 2017-04-07 DIAGNOSIS — J189 Pneumonia, unspecified organism: Secondary | ICD-10-CM

## 2017-04-07 DIAGNOSIS — D72829 Elevated white blood cell count, unspecified: Secondary | ICD-10-CM | POA: Diagnosis not present

## 2017-04-07 DIAGNOSIS — G911 Obstructive hydrocephalus: Secondary | ICD-10-CM | POA: Diagnosis not present

## 2017-04-07 DIAGNOSIS — K219 Gastro-esophageal reflux disease without esophagitis: Secondary | ICD-10-CM | POA: Diagnosis present

## 2017-04-07 DIAGNOSIS — J9601 Acute respiratory failure with hypoxia: Secondary | ICD-10-CM | POA: Diagnosis not present

## 2017-04-07 DIAGNOSIS — F17210 Nicotine dependence, cigarettes, uncomplicated: Secondary | ICD-10-CM | POA: Diagnosis present

## 2017-04-07 DIAGNOSIS — K5900 Constipation, unspecified: Secondary | ICD-10-CM | POA: Diagnosis not present

## 2017-04-07 DIAGNOSIS — H4310 Vitreous hemorrhage, unspecified eye: Secondary | ICD-10-CM | POA: Diagnosis not present

## 2017-04-07 DIAGNOSIS — R0902 Hypoxemia: Secondary | ICD-10-CM | POA: Diagnosis not present

## 2017-04-07 DIAGNOSIS — G441 Vascular headache, not elsewhere classified: Secondary | ICD-10-CM | POA: Diagnosis not present

## 2017-04-07 DIAGNOSIS — D72828 Other elevated white blood cell count: Secondary | ICD-10-CM | POA: Diagnosis not present

## 2017-04-07 DIAGNOSIS — I609 Nontraumatic subarachnoid hemorrhage, unspecified: Principal | ICD-10-CM | POA: Diagnosis present

## 2017-04-07 DIAGNOSIS — Y92002 Bathroom of unspecified non-institutional (private) residence single-family (private) house as the place of occurrence of the external cause: Secondary | ICD-10-CM | POA: Diagnosis not present

## 2017-04-07 DIAGNOSIS — Z09 Encounter for follow-up examination after completed treatment for conditions other than malignant neoplasm: Secondary | ICD-10-CM

## 2017-04-07 DIAGNOSIS — R2689 Other abnormalities of gait and mobility: Secondary | ICD-10-CM | POA: Diagnosis not present

## 2017-04-07 DIAGNOSIS — T380X5A Adverse effect of glucocorticoids and synthetic analogues, initial encounter: Secondary | ICD-10-CM | POA: Diagnosis not present

## 2017-04-07 DIAGNOSIS — I639 Cerebral infarction, unspecified: Secondary | ICD-10-CM | POA: Insufficient documentation

## 2017-04-07 DIAGNOSIS — R260 Ataxic gait: Secondary | ICD-10-CM | POA: Diagnosis not present

## 2017-04-07 DIAGNOSIS — H4313 Vitreous hemorrhage, bilateral: Secondary | ICD-10-CM | POA: Diagnosis not present

## 2017-04-07 DIAGNOSIS — I725 Aneurysm of other precerebral arteries: Secondary | ICD-10-CM

## 2017-04-07 DIAGNOSIS — W1830XA Fall on same level, unspecified, initial encounter: Secondary | ICD-10-CM | POA: Diagnosis present

## 2017-04-07 DIAGNOSIS — J690 Pneumonitis due to inhalation of food and vomit: Secondary | ICD-10-CM | POA: Diagnosis not present

## 2017-04-07 DIAGNOSIS — I161 Hypertensive emergency: Secondary | ICD-10-CM

## 2017-04-07 DIAGNOSIS — I608 Other nontraumatic subarachnoid hemorrhage: Secondary | ICD-10-CM | POA: Diagnosis present

## 2017-04-07 DIAGNOSIS — H4311 Vitreous hemorrhage, right eye: Secondary | ICD-10-CM | POA: Diagnosis not present

## 2017-04-07 DIAGNOSIS — R29701 NIHSS score 1: Secondary | ICD-10-CM | POA: Diagnosis present

## 2017-04-07 DIAGNOSIS — W19XXXA Unspecified fall, initial encounter: Secondary | ICD-10-CM

## 2017-04-07 DIAGNOSIS — R269 Unspecified abnormalities of gait and mobility: Secondary | ICD-10-CM | POA: Diagnosis not present

## 2017-04-07 DIAGNOSIS — I69398 Other sequelae of cerebral infarction: Secondary | ICD-10-CM | POA: Diagnosis not present

## 2017-04-07 DIAGNOSIS — E876 Hypokalemia: Secondary | ICD-10-CM | POA: Diagnosis not present

## 2017-04-07 DIAGNOSIS — D62 Acute posthemorrhagic anemia: Secondary | ICD-10-CM | POA: Diagnosis not present

## 2017-04-07 DIAGNOSIS — J9811 Atelectasis: Secondary | ICD-10-CM | POA: Diagnosis not present

## 2017-04-07 DIAGNOSIS — J9690 Respiratory failure, unspecified, unspecified whether with hypoxia or hypercapnia: Secondary | ICD-10-CM

## 2017-04-07 DIAGNOSIS — H4902 Third [oculomotor] nerve palsy, left eye: Secondary | ICD-10-CM | POA: Diagnosis not present

## 2017-04-07 DIAGNOSIS — S0083XA Contusion of other part of head, initial encounter: Secondary | ICD-10-CM | POA: Diagnosis present

## 2017-04-07 DIAGNOSIS — R5383 Other fatigue: Secondary | ICD-10-CM

## 2017-04-07 DIAGNOSIS — F4323 Adjustment disorder with mixed anxiety and depressed mood: Secondary | ICD-10-CM | POA: Diagnosis not present

## 2017-04-07 DIAGNOSIS — A499 Bacterial infection, unspecified: Secondary | ICD-10-CM | POA: Diagnosis not present

## 2017-04-07 DIAGNOSIS — R270 Ataxia, unspecified: Secondary | ICD-10-CM | POA: Diagnosis not present

## 2017-04-07 DIAGNOSIS — I69319 Unspecified symptoms and signs involving cognitive functions following cerebral infarction: Secondary | ICD-10-CM | POA: Diagnosis not present

## 2017-04-07 DIAGNOSIS — R1310 Dysphagia, unspecified: Secondary | ICD-10-CM | POA: Diagnosis not present

## 2017-04-07 DIAGNOSIS — N390 Urinary tract infection, site not specified: Secondary | ICD-10-CM | POA: Diagnosis not present

## 2017-04-07 DIAGNOSIS — Z79899 Other long term (current) drug therapy: Secondary | ICD-10-CM

## 2017-04-07 HISTORY — PX: IR ANGIO VERTEBRAL SEL VERTEBRAL UNI R MOD SED: IMG5368

## 2017-04-07 HISTORY — PX: IR ANGIOGRAM FOLLOW UP STUDY: IMG697

## 2017-04-07 HISTORY — PX: RADIOLOGY WITH ANESTHESIA: SHX6223

## 2017-04-07 HISTORY — PX: IR TRANSCATH/EMBOLIZ: IMG695

## 2017-04-07 HISTORY — PX: IR ANGIO INTRA EXTRACRAN SEL INTERNAL CAROTID BILAT MOD SED: IMG5363

## 2017-04-07 HISTORY — PX: IR ANGIOGRAM SELECTIVE EACH ADDITIONAL VESSEL: IMG667

## 2017-04-07 LAB — BASIC METABOLIC PANEL
Anion gap: 13 (ref 5–15)
BUN: 8 mg/dL (ref 6–20)
CALCIUM: 8.6 mg/dL — AB (ref 8.9–10.3)
CHLORIDE: 102 mmol/L (ref 101–111)
CO2: 20 mmol/L — ABNORMAL LOW (ref 22–32)
CREATININE: 0.7 mg/dL (ref 0.44–1.00)
GFR calc non Af Amer: >60 mL/min (ref >60)
Glucose, Bld: 132 mg/dL — ABNORMAL HIGH (ref 65–99)
Potassium: 4.1 mmol/L (ref 3.5–5.1)
SODIUM: 135 mmol/L (ref 135–145)

## 2017-04-07 LAB — CBC WITH DIFFERENTIAL/PLATELET
BASOS ABS: 0 10*3/uL (ref 0.0–0.1)
BASOS PCT: 0 %
Eosinophils Absolute: 0.1 10*3/uL (ref 0.0–0.7)
Eosinophils Relative: 1 %
HCT: 36.4 % (ref 36.0–46.0)
HEMOGLOBIN: 11.1 g/dL — AB (ref 12.0–15.0)
Lymphocytes Relative: 10 %
Lymphs Abs: 1.7 10*3/uL (ref 0.7–4.0)
MCH: 25.6 pg — AB (ref 26.0–34.0)
MCHC: 30.5 g/dL (ref 30.0–36.0)
MCV: 84.1 fL (ref 78.0–100.0)
MONOS PCT: 5 %
Monocytes Absolute: 0.8 10*3/uL (ref 0.1–1.0)
NEUTROS ABS: 14 10*3/uL — AB (ref 1.7–7.7)
NEUTROS PCT: 84 %
PLATELETS: 368 10*3/uL (ref 150–400)
RBC: 4.33 MIL/uL (ref 3.87–5.11)
RDW: 17.7 % — AB (ref 11.5–15.5)
WBC: 16.7 10*3/uL — ABNORMAL HIGH (ref 4.0–10.5)

## 2017-04-07 LAB — CBC
HCT: 34 % — ABNORMAL LOW (ref 36.0–46.0)
Hemoglobin: 10.5 g/dL — ABNORMAL LOW (ref 12.0–15.0)
MCH: 25.6 pg — ABNORMAL LOW (ref 26.0–34.0)
MCHC: 30.9 g/dL (ref 30.0–36.0)
MCV: 82.9 fL (ref 78.0–100.0)
Platelets: 369 10*3/uL (ref 150–400)
RBC: 4.1 MIL/uL (ref 3.87–5.11)
RDW: 17.6 % — ABNORMAL HIGH (ref 11.5–15.5)
WBC: 19.5 10*3/uL — ABNORMAL HIGH (ref 4.0–10.5)

## 2017-04-07 LAB — ABO/RH: ABO/RH(D): O POS

## 2017-04-07 LAB — PROTIME-INR
INR: 0.89
INR: 0.98
Prothrombin Time: 12 seconds (ref 11.4–15.2)
Prothrombin Time: 13 seconds (ref 11.4–15.2)

## 2017-04-07 LAB — I-STAT BETA HCG BLOOD, ED (MC, WL, AP ONLY)

## 2017-04-07 LAB — PREPARE RBC (CROSSMATCH)

## 2017-04-07 LAB — APTT: aPTT: 49 seconds — ABNORMAL HIGH (ref 24–36)

## 2017-04-07 SURGERY — RADIOLOGY WITH ANESTHESIA
Anesthesia: General

## 2017-04-07 MED ORDER — CLEVIDIPINE BUTYRATE 0.5 MG/ML IV EMUL
0.0000 mg/h | INTRAVENOUS | Status: DC
  Filled 2017-04-07: qty 50

## 2017-04-07 MED ORDER — ONDANSETRON HCL 4 MG/2ML IJ SOLN
4.0000 mg | Freq: Once | INTRAMUSCULAR | Status: AC
Start: 2017-04-07 — End: 2017-04-07
  Administered 2017-04-07: 4 mg via INTRAVENOUS
  Filled 2017-04-07: qty 2

## 2017-04-07 MED ORDER — NICARDIPINE HCL IN NACL 20-0.86 MG/200ML-% IV SOLN
3.0000 mg/h | Freq: Once | INTRAVENOUS | Status: AC
  Administered 2017-04-07: 5 mg/h via INTRAVENOUS
  Filled 2017-04-07 (×2): qty 200

## 2017-04-07 MED ORDER — IOPAMIDOL (ISOVUE-300) INJECTION 61%
INTRAVENOUS | Status: AC
  Administered 2017-04-07: 70 mL
  Filled 2017-04-07: qty 150

## 2017-04-07 MED ORDER — LABETALOL HCL 5 MG/ML IV SOLN
INTRAVENOUS | Status: AC
  Administered 2017-04-07: 10 mg
  Filled 2017-04-07: qty 4

## 2017-04-07 MED ORDER — ACETAMINOPHEN 650 MG RE SUPP: 650.0000 mg | RECTAL | Status: DC | PRN

## 2017-04-07 MED ORDER — SODIUM CHLORIDE 0.9 % IV SOLN
Freq: Once | INTRAVENOUS | Status: DC
Start: 2017-04-07 — End: 2017-04-19

## 2017-04-07 MED ORDER — ACETAMINOPHEN 325 MG PO TABS
650.0000 mg | ORAL_TABLET | ORAL | Status: DC | PRN
  Administered 2017-04-07 – 2017-04-19 (×12): 650 mg via ORAL
  Filled 2017-04-07 (×12): qty 2

## 2017-04-07 MED ORDER — MIDAZOLAM HCL 2 MG/2ML IJ SOLN
INTRAMUSCULAR | Status: AC
  Filled 2017-04-07: qty 2

## 2017-04-07 MED ORDER — ONDANSETRON 4 MG PO TBDP: 4.0000 mg | ORAL_TABLET | Freq: Four times a day (QID) | ORAL | Status: DC | PRN

## 2017-04-07 MED ORDER — PANTOPRAZOLE SODIUM 40 MG PO TBEC
40.0000 mg | DELAYED_RELEASE_TABLET | Freq: Every day | ORAL | Status: DC
  Administered 2017-04-08 – 2017-04-19 (×12): 40 mg via ORAL
  Filled 2017-04-07 (×12): qty 1

## 2017-04-07 MED ORDER — STROKE: EARLY STAGES OF RECOVERY BOOK
Freq: Once | Status: AC
Start: 2017-04-07 — End: 2017-04-19
  Administered 2017-04-19: 17:00:00
  Filled 2017-04-07 (×3): qty 1

## 2017-04-07 MED ORDER — ONDANSETRON HCL 4 MG/2ML IJ SOLN
INTRAMUSCULAR | Status: DC | PRN
  Administered 2017-04-07: 4 mg via INTRAVENOUS

## 2017-04-07 MED ORDER — ONDANSETRON HCL 4 MG/2ML IJ SOLN
4.0000 mg | Freq: Four times a day (QID) | INTRAMUSCULAR | Status: DC | PRN
  Administered 2017-04-07: 4 mg via INTRAVENOUS
  Filled 2017-04-07: qty 2

## 2017-04-07 MED ORDER — HEPARIN SODIUM (PORCINE) 1000 UNIT/ML IJ SOLN
INTRAMUSCULAR | Status: AC
  Filled 2017-04-07: qty 1

## 2017-04-07 MED ORDER — IOPAMIDOL (ISOVUE-370) INJECTION 76%
50.0000 mL | Freq: Once | INTRAVENOUS | Status: AC | PRN
  Administered 2017-04-07: 50 mL via INTRAVENOUS

## 2017-04-07 MED ORDER — NIMODIPINE 30 MG PO CAPS
60.0000 mg | ORAL_CAPSULE | ORAL | Status: DC
  Administered 2017-04-07 – 2017-04-19 (×71): 60 mg via ORAL
  Filled 2017-04-07 (×72): qty 2

## 2017-04-07 MED ORDER — DEXAMETHASONE SODIUM PHOSPHATE 10 MG/ML IJ SOLN
10.0000 mg | Freq: Once | INTRAMUSCULAR | Status: AC
  Administered 2017-04-07: 10 mg via INTRAVENOUS
  Filled 2017-04-07: qty 1

## 2017-04-07 MED ORDER — DOCUSATE SODIUM 100 MG PO CAPS
100.0000 mg | ORAL_CAPSULE | Freq: Two times a day (BID) | ORAL | Status: DC
  Administered 2017-04-09 – 2017-04-19 (×18): 100 mg via ORAL
  Filled 2017-04-07 (×19): qty 1

## 2017-04-07 MED ORDER — PHENYLEPHRINE HCL 10 MG/ML IJ SOLN
INTRAMUSCULAR | Status: DC | PRN
  Administered 2017-04-07: 25 ug/min via INTRAVENOUS

## 2017-04-07 MED ORDER — ACETAMINOPHEN 160 MG/5ML PO SOLN: 650.0000 mg | ORAL | Status: DC | PRN

## 2017-04-07 MED ORDER — NIMODIPINE 60 MG/20ML PO SOLN
60.0000 mg | ORAL | Status: DC
  Administered 2017-04-08: 60 mg
  Filled 2017-04-07: qty 20

## 2017-04-07 MED ORDER — SODIUM CHLORIDE 0.9 % IV SOLN
INTRAVENOUS | Status: DC
  Administered 2017-04-07 – 2017-04-11 (×5): via INTRAVENOUS
  Administered 2017-04-12: 1000 mL via INTRAVENOUS
  Administered 2017-04-12: 07:00:00 via INTRAVENOUS
  Administered 2017-04-13: 1000 mL via INTRAVENOUS
  Administered 2017-04-13 – 2017-04-15 (×2): via INTRAVENOUS
  Administered 2017-04-15: 1000 mL via INTRAVENOUS
  Administered 2017-04-16 – 2017-04-19 (×5): via INTRAVENOUS

## 2017-04-07 MED ORDER — ROCURONIUM BROMIDE 100 MG/10ML IV SOLN
INTRAVENOUS | Status: DC | PRN
  Administered 2017-04-07: 50 mg via INTRAVENOUS
  Administered 2017-04-07: 20 mg via INTRAVENOUS

## 2017-04-07 MED ORDER — ACETAMINOPHEN-CODEINE #3 300-30 MG PO TABS
1.0000 | ORAL_TABLET | ORAL | Status: DC | PRN
  Administered 2017-04-09 (×2): 1 via ORAL
  Filled 2017-04-07: qty 1
  Filled 2017-04-07: qty 2

## 2017-04-07 MED ORDER — NICARDIPINE HCL IN NACL 20-0.86 MG/200ML-% IV SOLN
3.0000 mg/h | INTRAVENOUS | Status: DC
  Administered 2017-04-07: 5 mg/h via INTRAVENOUS
  Administered 2017-04-07 – 2017-04-08 (×3): 15 mg/h via INTRAVENOUS
  Administered 2017-04-08: 12.5 mg/h via INTRAVENOUS
  Administered 2017-04-08: 10 mg/h via INTRAVENOUS
  Filled 2017-04-07 (×2): qty 200
  Filled 2017-04-07: qty 400
  Filled 2017-04-07 (×2): qty 200
  Filled 2017-04-07: qty 400
  Filled 2017-04-07: qty 200

## 2017-04-07 MED ORDER — IOPAMIDOL (ISOVUE-300) INJECTION 61%
INTRAVENOUS | Status: AC
  Filled 2017-04-07: qty 150

## 2017-04-07 MED ORDER — SUCCINYLCHOLINE CHLORIDE 200 MG/10ML IV SOSY
PREFILLED_SYRINGE | INTRAVENOUS | Status: DC | PRN
  Administered 2017-04-07: 60 mg via INTRAVENOUS

## 2017-04-07 MED ORDER — SUGAMMADEX SODIUM 200 MG/2ML IV SOLN
INTRAVENOUS | Status: DC | PRN
  Administered 2017-04-07: 300 mg via INTRAVENOUS

## 2017-04-07 MED ORDER — SODIUM CHLORIDE 0.9 % IV SOLN
INTRAVENOUS | Status: DC | PRN
  Administered 2017-04-07: 14:00:00 via INTRAVENOUS

## 2017-04-07 MED ORDER — FENTANYL CITRATE (PF) 100 MCG/2ML IJ SOLN
50.0000 ug | Freq: Once | INTRAMUSCULAR | Status: AC
  Administered 2017-04-07: 50 ug via INTRAVENOUS
  Filled 2017-04-07: qty 2

## 2017-04-07 MED ORDER — FENTANYL CITRATE (PF) 100 MCG/2ML IJ SOLN
INTRAMUSCULAR | Status: AC
  Filled 2017-04-07: qty 2

## 2017-04-07 MED ORDER — PANTOPRAZOLE SODIUM 40 MG PO PACK
40.0000 mg | PACK | Freq: Every day | ORAL | Status: DC
  Filled 2017-04-07 (×2): qty 20

## 2017-04-07 MED ORDER — SODIUM CHLORIDE 0.9 % IV SOLN
0.0125 ug/kg/min | INTRAVENOUS | Status: AC
  Administered 2017-04-07: .3 ug/kg/min via INTRAVENOUS
  Filled 2017-04-07: qty 2000

## 2017-04-07 MED ORDER — LIDOCAINE 2% (20 MG/ML) 5 ML SYRINGE
INTRAMUSCULAR | Status: DC | PRN
  Administered 2017-04-07: 60 mg via INTRAVENOUS

## 2017-04-07 MED ORDER — PROPOFOL 10 MG/ML IV BOLUS
INTRAVENOUS | Status: DC | PRN
  Administered 2017-04-07: 110 mg via INTRAVENOUS

## 2017-04-07 MED ORDER — NICARDIPINE HCL IN NACL 20-0.86 MG/200ML-% IV SOLN
INTRAVENOUS | Status: DC | PRN
  Administered 2017-04-07: 2.5 mg/h via INTRAVENOUS

## 2017-04-07 NOTE — Consult Note (Signed)
Neurology Consultation Reason for Consult: Syncope Referring Physician: Para Skeans  CC: headache.   History is obtained from: Patient  HPI: KYNSLEIGH WESTENDORF is a 41 y.o. female he states that she woke up with a mild headache. Then around 9:15, she had sudden onset of abrupt worsening of her headache followed by nausea. She vomited and lost consciousness and fell and hit her head. Following this, 911 was called and she was noted to have some mild left leg weakness en route. For this reason a code stroke was called.   LKW: 9:15 AM tpa given?: no, sah Hunt and hess: She does have mild focal deficits, though with relatively preserved consciousness. I think this makes her a grade 3   ROS: A 14 point ROS was performed and is negative except as noted in the HPI.   Past Medical History:  Diagnosis Date  . Duodenal obstruction   . GERD (gastroesophageal reflux disease)   . Hypertension      Family History  Problem Relation Age of Onset  . Malignant hyperthermia Neg Hx      Social History:  reports that she has been smoking Cigarettes.  She has a 20.00 pack-year smoking history. She has never used smokeless tobacco. She reports that she does not drink alcohol or use drugs.   Exam: Current vital signs: BP (!) 158/75 (BP Location: Left Arm)   Pulse 84   Temp 97.6 F (36.4 C) (Oral)   Resp 11   Ht  (1.626 m)   Wt 66.1 kg (145 lb 11.6 oz)   SpO2 99%   BMI 25.01 kg/m  Vital signs in last 24 hours: Temp:  [97.6 F (36.4 C)] 97.6 F (36.4 C) (04/29 1202) Pulse Rate:  [84-101] 84 (04/29 1211) Resp:  [11-35] 11 (04/29 1211) BP: (158-177)/(75-89) 158/75 (04/29 1211) SpO2:  [99 %-100 %] 99 % (04/29 1211) Weight:  [66.1 kg (145 lb 11.6 oz)] 66.1 kg (145 lb 11.6 oz) (04/29 1208)   Physical Exam  Constitutional: Appears well-developed and well-nourished.  Psych: Affect appropriate to situation Eyes: No scleral injection HENT: No OP obstrucion Head: Normocephalic.   Cardiovascular: Normal rate and regular rhythm.  Respiratory: Effort normal and breath sounds normal to anterior ascultation GI: Soft.  No distension. There is no tenderness.  Skin: WDI  Neuro: Mental Status: Patient is awake, alert, oriented to person, place, month, year, and situation. Patient is able to give a clear and coherent history. No signs of aphasia or neglect Cranial Nerves: II: Visual Fields are full. Pupils are equal, round, and reactive to light.   III,IV, VI: EOMI without ptosis or diploplia.  V: Facial sensation is symmetric to temperature VII: Facial movement is symmetric.  VIII: hearing is intact to voice X: Uvula elevates symmetrically XI: Shoulder shrug is symmetric. XII: tongue is midline without atrophy or fasciculations.  Motor: Tone is normal. Bulk is normal. She has mild left leg weakness Sensory: Sensation is symmetric to light touch and temperature in the arms and legs. Cerebellar: No clear ataxia   I have reviewed labs in epic and the results pertinent to this consultation are: BMP-unremarkable  I have reviewed the images obtained: CT head-subarachnoid hemorrhage  Impression: 41 year old female with abrupt onset headache/syncope with subarachnoid hemorrhage on CT.  Recommendations: 1) CT angiogram of the head 2) blood pressure control, goal BP less than 160 systolic 3) neurosurgical consultation 4) will defer to neurosurgery for further management, please call if neurology can be of further assistance.  Roland Rack, MD [facility] [phone]  If 7pm- 7am, please page neurology on call as listed in [place].

## 2017-04-07 NOTE — ED Provider Notes (Signed)
[facility]-EMERGENCY DEPT Provider Note   CSN: [id number] Arrival date & time: 04/07/17  1128     History   Chief Complaint Chief Complaint  Patient presents with  . Code Stroke    Code Stroke     HPI [name] Barnett is a 41 y.o. female.  Pt presents to the ED via EMS as a code stroke.  She woke up this morning with a severe headache with n/v.  She then had a syncopal episode and hit her head.   When EMS arrived at her home, her sbp was over 200.  She has a hx of htn, but has not been taking her meds.       Past Medical History:  Diagnosis Date  . Duodenal obstruction   . GERD (gastroesophageal reflux disease)   . Hypertension     Patient Active Problem List   Diagnosis Date Noted  . 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 Elevated gastrin level 01/25/2012  . Hypokalemia 01/21/2012  . Nausea & vomiting 01/20/2012  . Epigastric pain 01/20/2012  . Duodenal ulcer, acute with obstruction 10/17/2011  . S/P laparoscopic cholecystectomy 10/16/2011    Past Surgical History:  Procedure Laterality Date  . BALLOON DILATION  12/31/2011   Procedure: BALLOON DILATION;  Surgeon: Barrie Folk, MD;  Location: [facility] ENDOSCOPY;  Service: Endoscopy;  Laterality: N/A;  . CHOLECYSTECTOMY  07/28/11  . ESOPHAGOGASTRODUODENOSCOPY  10/17/2011   Procedure: ESOPHAGOGASTRODUODENOSCOPY (EGD);  Surgeon: Barrie Folk, MD;  Location: [facility] ENDOSCOPY;  Service: Endoscopy;  Laterality: N/A;  . TEMPOROMANDIBULAR JOINT SURGERY     2 surgeries  . VAGOTOMY  01/23/2012   Procedure: VAGOTOMY, antrectomy and BII;  Surgeon: Currie Paris, MD;  Location: [facility] OR;  Service: General;  Laterality: N/A;  Laparotomy with vagotomy.    OB History    No data available       Home Medications    Prior to Admission medications   Medication Sig Start Date End Date Taking? Authorizing Provider  ibuprofen (ADVIL,MOTRIN) 200 MG tablet Take 200 mg by mouth every 6 (six) hours as needed for  moderate pain.   Yes Historical Provider, MD  feeding supplement (RESOURCE BREEZE) LIQD Take 1 Container by mouth 2 (two) times daily with a meal. Patient not taking: Reported on 04/07/2017 01/30/12   Sherrie George, PA-C  pantoprazole (PROTONIX) 40 MG tablet Take 1 tablet (40 mg total) by mouth 2 (two) times daily before a meal. 01/30/12 01/29/13  Sherrie George, PA-C    Family History Family History  Problem Relation Age of Onset  . Malignant hyperthermia Neg Hx     Social History Social History  Substance Use Topics  . Smoking status: Current Every Day Smoker    Packs/day: 1.00    Years: 20.00    Types: Cigarettes    Last attempt to quit: 10/17/2011  . Smokeless tobacco: Never Used  . Alcohol use No     Allergies   Nsaids   Review of Systems Review of Systems  Neurological: Positive for headaches.  All other systems reviewed and are negative.    Physical Exam Updated Vital Signs BP (!) 162/92 (BP Location: Right Arm)   Pulse 74   Temp 97.6 F (36.4 C) (Oral)   Resp 18   Ht  (1.626 m)   Wt 145 lb 11.6 oz (66.1 kg)   SpO2 100%   BMI 25.01 kg/m   Physical Exam  Constitutional: She is oriented to person,  place, and time. She appears well-developed and well-nourished.  HENT:  Head: Normocephalic.    Right Ear: External ear normal.  Left Ear: External ear normal.  Nose: Nose normal.  Mouth/Throat: Oropharynx is clear and moist.  Eyes: Conjunctivae and EOM are normal. Pupils are equal, round, and reactive to light.  No pain with eom movement  Neck: Normal range of motion. Neck supple.  Cardiovascular: Normal rate, regular rhythm, normal heart sounds and intact distal pulses.   Pulmonary/Chest: Effort normal and breath sounds normal.  Abdominal: Soft. Bowel sounds are normal.  Musculoskeletal: Normal range of motion.  Neurological: She is alert and oriented to person, place, and time.  Weakness to left lower extremity  Skin: Skin is warm and dry.  Capillary refill takes less than 2 seconds.  Psychiatric: She has a normal mood and affect. Her behavior is normal. Judgment and thought content normal.  Nursing note and vitals reviewed.    ED Treatments / Results  Labs (all labs ordered are listed, but only abnormal results are displayed) Labs Reviewed  CBC WITH DIFFERENTIAL/PLATELET - Abnormal; Notable for the following:       Result Value   WBC 16.7 (*)    Hemoglobin 11.1 (*)    MCH 25.6 (*)    RDW 17.7 (*)    Neutro Abs 14.0 (*)    All other components within normal limits  BASIC METABOLIC PANEL - Abnormal; Notable for the following:    CO2 20 (*)    Glucose, Bld 132 (*)    Calcium 8.6 (*)    All other components within normal limits  PROTIME-INR  HIV ANTIBODY (ROUTINE TESTING)  CBC  PROTIME-INR  APTT  RAPID URINE DRUG SCREEN, HOSP PERFORMED  I-STAT TROPOININ, ED  I-STAT CHEM 8, ED  I-STAT BETA HCG BLOOD, ED ([facility], [facility], [facility] ONLY)  CBG MONITORING, ED  TYPE AND SCREEN  PREPARE RBC (CROSSMATCH)    EKG  EKG Interpretation  Date/Time:  Sunday April 07 2017 12:12:50 EDT Ventricular Rate:  82 PR Interval:    QRS Duration: 85 QT Interval:  430 QTC Calculation: 503 R Axis:   66 Text Interpretation:  Sinus rhythm Right atrial enlargement Consider left ventricular hypertrophy ST elev, probable normal early repol pattern Borderline prolonged QT interval Confirmed by Particia Nearing MD, Maxon Kresse ([id number]) on 04/07/2017 12:42:39 PM       Radiology Ct Angio Head W Or Wo Contrast  Result Date: 04/07/2017 CLINICAL DATA:  Severe headache with subsequent fall. EXAM: CT ANGIOGRAPHY HEAD AND NECK TECHNIQUE: Multidetector CT imaging of the head and neck was performed using the standard protocol during bolus administration of intravenous contrast. Multiplanar CT image reconstructions and MIPs were obtained to evaluate the vascular anatomy. Carotid stenosis measurements (when applicable) are obtained utilizing NASCET criteria, using the distal  internal carotid diameter as the denominator. CONTRAST:  50 mL Isovue 370. COMPARISON:  CT head reported separately. FINDINGS: CTA NECK Aortic arch: Standard branching. Imaged portion shows no evidence of aneurysm or dissection. No significant stenosis of the major arch vessel origins. Right carotid system: Minor atheromatous change. Slight beading and irregularity of the cervical ICA, consistent with mild fibromuscular dysplasia, no dissection. No evidence of dissection, stenosis (50% or greater) or occlusion. Left carotid system: Minor atheromatous change. No evidence of dissection, stenosis (50% or greater) or occlusion. Slight beading and irregularity of the cervical ICA, consistent with mild fibromuscular dysplasia. No dissection. Vertebral arteries: RIGHT vertebral dominant. No evidence of dissection, stenosis (50% or greater) or occlusion.  Nonvascular soft tissues:  Described separately. CTA HEAD Anterior circulation: Unremarkable. Minor calcific atheromatous change. No significant stenosis, proximal occlusion, aneurysm, or vascular malformation. Posterior circulation: There is a moderately wide necked saccular aneurysm projecting leftward and slightly anterior, originating from the basilar artery, at the LEFT PCA origin, 6 x 8 x 4 mm. No significant stenosis, proximal occlusion, or vascular malformation. Venous sinuses: As permitted by contrast timing, patent. Anatomic variants: None of significance. Delayed phase:   No abnormal intracranial enhancement. Review of the MIP images confirms the above findings. 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 6 x 8 x 4 mm. Neurointerventional consultation is warranted. Findings discussed with Dr. Amada Jupiter at 12:06 p.m. 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 Electronically Signed   By: Elsie Stain M.D.   On:  04/07/2017 12:32   Ct Angio Neck W And/or Wo Contrast  Result Date: 04/07/2017 CLINICAL DATA:  Severe headache with subsequent fall. EXAM: CT ANGIOGRAPHY HEAD AND NECK TECHNIQUE: Multidetector CT imaging of the head and neck was performed using the standard protocol during bolus administration of intravenous contrast. Multiplanar CT image reconstructions and MIPs were obtained to evaluate the vascular anatomy. Carotid stenosis measurements (when applicable) are obtained utilizing NASCET criteria, using the distal internal carotid diameter as the denominator. CONTRAST:  50 mL Isovue 370. COMPARISON:  CT head reported separately. FINDINGS: CTA NECK Aortic arch: Standard branching. Imaged portion shows no evidence of aneurysm or dissection. No significant stenosis of the major arch vessel origins. Right carotid system: Minor atheromatous change. Slight beading and irregularity of the cervical ICA, consistent with mild fibromuscular dysplasia, no dissection. No evidence of dissection, stenosis (50% or greater) or occlusion. Left carotid system: Minor atheromatous change. No evidence of dissection, stenosis (50% or greater) or occlusion. Slight beading and irregularity of the cervical ICA, consistent with mild fibromuscular dysplasia. No dissection. Vertebral arteries: RIGHT vertebral dominant. No evidence of dissection, stenosis (50% or greater) or occlusion. Nonvascular soft tissues:  Described separately. CTA HEAD Anterior circulation: Unremarkable. Minor calcific atheromatous change. No significant stenosis, proximal occlusion, aneurysm, or vascular malformation. Posterior circulation: There is a moderately wide necked saccular aneurysm projecting leftward and slightly anterior, originating from the basilar artery, at the LEFT PCA origin, 6 x 8 x 4 mm. No significant stenosis, proximal occlusion, or vascular malformation. Venous sinuses: As permitted by contrast timing, patent. Anatomic variants: None of  significance. Delayed phase:   No abnormal intracranial enhancement. Review of the MIP images confirms the above findings. 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 6 x 8 x 4 mm. Neurointerventional consultation is warranted. Findings discussed with Dr. Amada Jupiter at 12:06 p.m. 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 Electronically Signed   By: Elsie Stain M.D.   On: 04/07/2017 12:32   Ct C-spine No Charge  Result Date: 04/07/2017 CLINICAL DATA:  Severe headache and dizziness followed by a fall. Large facial hematoma. EXAM: CT CERVICAL SPINE WITHOUT CONTRAST TECHNIQUE: Multidetector CT imaging of the cervical spine was performed without intravenous contrast. Multiplanar CT image reconstructions were also generated. COMPARISON:  CTA head neck reported separately. CT maxillofacial reported separately. FINDINGS: Alignment: Straightening of the normal cervical lordosis. No traumatic subluxation. Skull base and vertebrae: No acute fracture. No primary bone lesion or focal pathologic process. Soft tissues and spinal canal: No prevertebral  fluid or swelling. No visible canal hematoma. Disc levels: No disc protrusion or stenosis. No significant disc space narrowing. Upper chest: No pneumothorax or upper rib fracture. Other: No neck masses of significance. IMPRESSION: Slight straightening of the cervical lordosis could be positional or due to spasm. There is no cervical spine fracture or traumatic subluxation Electronically Signed   By: Elsie Stain M.D.   On: 04/07/2017 12:34   Ct Head Code Stroke W/o Cm  Result Date: 04/07/2017 CLINICAL DATA:  Code stroke.  Headaches. EXAM: CT HEAD WITHOUT CONTRAST TECHNIQUE: Contiguous axial images were obtained from the base of the skull through the vertex without intravenous contrast. COMPARISON:  CTA head neck is pending.  FINDINGS: Brain: Diffuse subarachnoid blood throughout the basilar cisterns, greater than would be expected for simple head trauma. Intraventricular blood is also noted. No hydrocephalus or parenchymal hemorrhage. No focal extra-axial collection. No parenchymal hemorrhage. Vascular: No hyperdense vessel or unexpected calcification. Skull: Normal. Negative for fracture or focal lesion. Sinuses/Orbits: There is preseptal periorbital hematoma on the LEFT, but the globe is intact. Other: LEFT frontal scalp hematoma. ASPECTS [facility] Stroke Program Early CT Score) - Ganglionic level infarction (caudate, lentiform nuclei, internal capsule, insula, M1-M3 cortex): 7 - Supraganglionic infarction (M4-M6 cortex): 3 Total score (0-10 with 10 being normal): 10 IMPRESSION: 1. Diffuse subarachnoid blood throughout the basilar cisterns, consistent with aneurysmal SAH. CTA head neck is pending. 2. ASPECTS is 10. These results were called by telephone at the time of interpretation on 04/07/2017 at 12:09 pm to Dr. Amada Jupiter, who verbally acknowledged these results. Electronically Signed   By: Elsie Stain M.D.   On: 04/07/2017 12:10   Ct Maxillofacial Wo Contrast  Result Date: 04/07/2017 CLINICAL DATA:  Patient fell and hit face. EXAM: CT MAXILLOFACIAL WITHOUT CONTRAST TECHNIQUE: Multidetector CT imaging of the maxillofacial structures was performed. Multiplanar CT image reconstructions were also generated. A small metallic BB was placed on the right temple in order to reliably differentiate right from left. COMPARISON:  CTA head neck reported separately. CT cervical spine reported separately. FINDINGS: Osseous: No fracture or mandibular dislocation. No destructive process. Orbits: No intraorbital traumatic or inflammatory finding. LEFT preseptal periorbital soft tissue swelling related to the hematoma. No postseptal hematoma or globe injury. Sinuses: Clear.  No blowout injury. Soft tissues: LEFT facial soft tissue swelling.  Limited intracranial: Reported separately. IMPRESSION: LEFT preseptal periorbital soft tissue swelling/hematoma. No postseptal hematoma, or globe injury.  No blowout fracture Electronically Signed   By: Elsie Stain M.D.   On: 04/07/2017 12:38    Procedures Procedures (including critical care time)  Medications Ordered in ED Medications   stroke: mapping our early stages of recovery book (not administered)  0.9 %  sodium chloride infusion (not administered)  acetaminophen (TYLENOL) tablet 650 mg (not administered)    Or  acetaminophen (TYLENOL) solution 650 mg (not administered)    Or  acetaminophen (TYLENOL) suppository 650 mg (not administered)  docusate sodium (COLACE) capsule 100 mg (not administered)  ondansetron (ZOFRAN-ODT) disintegrating tablet 4 mg (not administered)    Or  ondansetron (ZOFRAN) injection 4 mg (not administered)  niMODipine (NIMOTOP) capsule 60 mg (not administered)    Or  NiMODipine (NYMALIZE) 60 MG/20ML oral solution 60 mg (not administered)  pantoprazole (PROTONIX) EC tablet 40 mg (not administered)    Or  pantoprazole sodium (PROTONIX) 40 mg/20 mL oral suspension 40 mg (not administered)  acetaminophen-codeine (TYLENOL #3) 300-30 MG per tablet 1-2 tablet (not administered)  clevidipine (CLEVIPREX) infusion 0.5 mg/mL (  not administered)  fentaNYL (SUBLIMAZE) 100 MCG/2ML injection (not administered)  midazolam (VERSED) 2 MG/2ML injection (not administered)  iopamidol (ISOVUE-300) 61 % injection (not administered)  heparin 1000 UNIT/ML injection (not administered)  0.9 %  sodium chloride infusion (not administered)  nicardipine (CARDENE)  in 0.86% saline IV infusion (0.1 mg/ml) (2.5 mg/hr Intravenous Transfusing/Transfer 04/07/17 1442)  iopamidol (ISOVUE-370) 76 % injection 50 mL (50 mLs Intravenous Contrast Given 04/07/17 1200)  labetalol (NORMODYNE,TRANDATE) 5 MG/ML injection (10 mg  Given 04/07/17 1209)  ondansetron (ZOFRAN) injection 4 mg (4 mg  Intravenous Given 04/07/17 1312)  fentaNYL (SUBLIMAZE) injection 50 mcg (50 mcg Intravenous Given 04/07/17 1314)  dexamethasone (DECADRON) injection 10 mg (10 mg Intravenous Given 04/07/17 1330)  remifentanil (ULTIVA) 2 mg in 100 mL normal saline (20 mcg/mL) Optime (0.1 mcg/kg/min  66.1 kg Intravenous Rate/Dose Change 04/07/17 1501)     Initial Impression / Assessment and Plan / ED Course  I have reviewed the triage vital signs and the nursing notes.  Pertinent labs & imaging results that were available during my care of the patient were reviewed by me and considered in my medical decision making (see chart for details).    Dr. Amada Jupiter (neurology) responded to the code stroke.  He ordered the CTA after seeing the head CT.    I spoke with NS who will see pt.  They will take her to the OR.  Pt started on cardene for bp control with goal of sbp less than 160.  CRITICAL CARE Performed by: Jacalyn Lefevre   Total critical care time:  30 minutes  Critical care time was exclusive of separately billable procedures and treating other patients.  Critical care was necessary to treat or prevent imminent or life-threatening deterioration.  Critical care was time spent personally by me on the following activities: development of treatment plan with patient and/or surrogate as well as nursing, discussions with consultants, evaluation of patient's response to treatment, examination of patient, obtaining history from patient or surrogate, ordering and performing treatments and interventions, ordering and review of laboratory studies, ordering and review of radiographic studies, pulse oximetry and re-evaluation of patient's condition.   Final Clinical Impressions(s) / ED Diagnoses   Final diagnoses:  SAH (subarachnoid hemorrhage) ([name])  Basilar artery aneurysm ([name])  Facial contusion, initial encounter  Hypertensive emergency    New Prescriptions New Prescriptions   No medications on file      Jacalyn Lefevre, MD 04/07/17 1511

## 2017-04-07 NOTE — ED Notes (Signed)
Pt. Remains on monitor family at bedside no change in condition

## 2017-04-07 NOTE — ED Notes (Signed)
Pt brought back to ER room Neuro to room

## 2017-04-07 NOTE — ED Notes (Signed)
Pt moved up in the bed and placed on bedpan pt c/o increasing pain to head ER MD made aware orders received and carried out

## 2017-04-07 NOTE — Anesthesia Preprocedure Evaluation (Addendum)
Anesthesia Evaluation  Patient identified by MRN, date of birth, ID band Patient awake    Reviewed: Allergy & Precautions, NPO status , Patient's Chart, lab work & pertinent test results  History of Anesthesia Complications Negative for: history of anesthetic complications  Airway Mallampati: III  TM Distance: <3 FB Neck ROM: Full    Dental  (+) Teeth Intact   Pulmonary Current Smoker,    breath sounds clear to auscultation       Cardiovascular hypertension, negative cardio ROS   Rhythm:Regular     Neuro/Psych SAH, left weakness, awake and alert CVA negative psych ROS   GI/Hepatic Neg liver ROS, PUD, GERD  Medicated and Controlled,  Endo/Other  negative endocrine ROS  Renal/GU negative Renal ROS  negative genitourinary   Musculoskeletal negative musculoskeletal ROS (+)   Abdominal   Peds negative pediatric ROS (+)  Hematology negative hematology ROS (+)   Anesthesia Other Findings   Reproductive/Obstetrics negative OB ROS                             Anesthesia Physical Anesthesia Plan  ASA: III and emergent  Anesthesia Plan: General   Post-op Pain Management:    Induction: Intravenous  Airway Management Planned: Oral ETT  Additional Equipment: Arterial line  Intra-op Plan:   Post-operative Plan: Possible Post-op intubation/ventilation and Extubation in OR  Informed Consent: I have reviewed the patients History and Physical, chart, labs and discussed the procedure including the risks, benefits and alternatives for the proposed anesthesia with the patient or authorized representative who has indicated his/her understanding and acceptance.   Dental advisory given  Plan Discussed with: Surgeon and CRNA  Anesthesia Plan Comments: (PIV x  2 or CVL)       Anesthesia Quick Evaluation

## 2017-04-07 NOTE — ED Notes (Signed)
Neurosurgeon PA at bedside pt c/o increasing pain to her head and neck

## 2017-04-07 NOTE — H&P (Signed)
CC:  Chief Complaint  Patient presents with  . Code Stroke    Code Stroke     HPI: [name] Barnett is a 41 y.o. female who was brought to the ER as a code stroke. Woke up this morning with mild occipital headache with acute worsening at 0915. Reports sharp pains from back of head to neck with immediate nausea. Went to restroom where she vomited, lost consciousness and fell striking head. Since arrival to ER, headache decreased in severity, but is still present. + posterior neck pain. Endorses blurry vision right eye and is unable to open left eye. States her left leg is weaker than right leg but denies paresthesias.  Not on anti-coag. No FHx of aneurysm.   PMH: Past Medical History:  Diagnosis Date  . Duodenal obstruction   . GERD (gastroesophageal reflux disease)   . Hypertension     PSH: Past Surgical History:  Procedure Laterality Date  . BALLOON DILATION  12/31/2011   Procedure: BALLOON DILATION;  Surgeon: Barrie Folk, MD;  Location: [facility] ENDOSCOPY;  Service: Endoscopy;  Laterality: N/A;  . CHOLECYSTECTOMY  07/28/11  . ESOPHAGOGASTRODUODENOSCOPY  10/17/2011   Procedure: ESOPHAGOGASTRODUODENOSCOPY (EGD);  Surgeon: Barrie Folk, MD;  Location: [facility] ENDOSCOPY;  Service: Endoscopy;  Laterality: N/A;  . TEMPOROMANDIBULAR JOINT SURGERY     2 surgeries  . VAGOTOMY  01/23/2012   Procedure: VAGOTOMY, antrectomy and BII;  Surgeon: Currie Paris, MD;  Location: [facility] OR;  Service: General;  Laterality: N/A;  Laparotomy with vagotomy.    SH: Social History  Substance Use Topics  . Smoking status: Current Every Day Smoker    Packs/day: 1.00    Years: 20.00    Types: Cigarettes    Last attempt to quit: 10/17/2011  . Smokeless tobacco: Never Used  . Alcohol use No    MEDS: Prior to Admission medications   Medication Sig Start Date End Date Taking? Authorizing Provider  DEXILANT 60 MG capsule  11/24/11   Historical Provider, MD  feeding supplement (RESOURCE BREEZE) LIQD Take 1 Container  by mouth 2 (two) times daily with a meal. 01/30/12   Sherrie George, PA-C  pantoprazole (PROTONIX) 40 MG tablet Take 1 tablet (40 mg total) by mouth 2 (two) times daily before a meal. 01/30/12 01/29/13  Sherrie George, PA-C    ALLERGY: Allergies  Allergen Reactions  . 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  HENT: Negative.   Eyes: Positive for blurred vision.  Respiratory: Negative.   Cardiovascular: Negative.   Gastrointestinal: Positive for nausea and vomiting.  Genitourinary: Negative.   Musculoskeletal: Positive for back pain, myalgias and neck pain.  Skin: Negative.   Neurological: Positive for focal weakness, loss of consciousness, weakness and headaches. Negative for dizziness, tingling, tremors, sensory change, speech change and seizures.    Vitals:   04/07/17 1220 04/07/17 1230  BP: (!) 156/80 (!) 151/83  Pulse: 77 79  Resp: 10 13  Temp:     General appearance: Resting comfortably. Not in acute distress.  Skin: Bruising and swelling left eyelid, Eyes: PERRL, Fundoscopic: normal Cardiovascular: Regular rate and rhythm without murmurs, rubs, gallops. No edema or variciosities. Distal pulses normal. Pulmonary: Clear to auscultation Musculoskeletal:     Muscle tone upper extremities: Normal    Muscle tone lower extremities: Normal    Motor exam: Upper Extremities Deltoid Bicep Tricep Grip  Right 5/5  5/5 5/5 5/5  Left 5/5 5/5 5/5 5/5   Lower Extremity IP Quad PF DF EHL  Right 5/5 5/5 5/5 5/5 5/5  Left 4+/5 4+/5 4+/5 4+/5 4+/5   Neurological Awake, alert, oriented Memory and concentration grossly intact Speech fluent, appropriate CNII: Visual fields normal CNIII/IV/VI: EOMI CNV: Facial sensation normal CNVII: Symmetric, normal strength CNVIII: Grossly normal CNIX: Normal palate movement CNXI: Trap and SCM strength normal CN XII:  Tongue protrusion normal Sensation grossly intact to LT  IMAGING: CT HEAD IMPRESSION: 1. Diffuse subarachnoid blood throughout the basilar cisterns, consistent with aneurysmal SAH. CTA head neck is pending. 2. ASPECTS is 10.  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 6 x 8 x 4 mm. Neurointerventional consultation is warranted.  IMPRESSION/PLAN - 41 y.o. 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 She has mild deficits on exam which is fortunate considering her imaging. I have discussed the case with Dr Conchita Paris who has also reviewed the imaging. She will need to undergo clipping of the aneurysm. I have discussed the risks and benefits of the procedure including alternatives with the patient and husband at bedside. They wish to proceed. Will schedule for today as soon as possible.

## 2017-04-07 NOTE — Progress Notes (Signed)
eLink Physician-Brief Progress Note Patient Name: [name] Barnett DOB: 1976-01-07 MRN: [id number]   Date of Service  04/07/2017  HPI/Events of Note  SAH, s/p coiling of basilar aneurysm Extubated post procedure  eICU Interventions  Monitor BP, clevidipine with parameters     Intervention Category Evaluation Type: New Patient Evaluation  Mileydi Milsap V. 04/07/2017, 6:47 PM

## 2017-04-07 NOTE — ED Notes (Signed)
CBG 138

## 2017-04-07 NOTE — ED Notes (Signed)
Labetalol push given

## 2017-04-07 NOTE — ED Triage Notes (Signed)
Pt arrives from home post syncopal episode pt describes the moments up until she passed out when suddenly she had a headache to the back of her head than vomitted than passed out and hit her head

## 2017-04-07 NOTE — Brief Op Note (Signed)
PREOP DX: Subarachnoid hemorrhage  POSTOP DX: Same  PROCEDURE:  1. Diagnostic angiogram 2. Coil embolization of basilar artery aneurysm  SURGEON: Shaivi Rothschild  ANESTHESIA: GETA  EBL: Minimal  SPECIMENS: None  DRAINS: None  COMPLICATIONS: None immediate  CONDITION: Stable to PACU  FINDINGS:  1. 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 This is the likely source of hemorrhage 2. Complete occlusion of the aneurysm post coiling

## 2017-04-07 NOTE — Progress Notes (Signed)
PT Cancellation Note  Patient Details Name: [name] Barnett MRN: [id number] DOB: May 07, 1976   Cancelled Treatment:    Reason Eval/Treat Not Completed: Patient at procedure or test/unavailable;Patient not medically ready.  Patient to OR today.  Will check on patient tomorrow.  **MD:  Please write activity orders for patient, and PT will initiate evaluation at that time.  Thank you.   Vena Austria 04/07/2017, 5:06 PM Durenda Hurt. Renaldo Fiddler, [facility] Acute Rehab Services Pager [phone]

## 2017-04-07 NOTE — Transfer of Care (Signed)
Immediate Anesthesia Transfer of Care Note  Patient: [name] Barnett  Procedure(s) Performed: Procedure(s): RADIOLOGY WITH ANESTHESIA (N/A)  Patient Location: PACU  Anesthesia Type:General  Level of Consciousness: drowsy  Airway & Oxygen Therapy: Patient Spontanous Breathing and Patient connected to face mask oxygen  Post-op Assessment: Report given to RN and Post -op Vital signs reviewed and stable  Post vital signs: Reviewed and stable  Last Vitals:  Vitals:   04/07/17 1330 04/07/17 1442  BP: (!) 160/93 (!) 162/92  Pulse: 73 74  Resp: 20 18  Temp:      Last Pain:  Vitals:   04/07/17 1442  TempSrc:   PainSc: 8          Complications: No apparent anesthesia complications

## 2017-04-07 NOTE — Anesthesia Procedure Notes (Signed)
Procedure Name: Intubation Date/Time: 04/07/2017 2:43 PM Performed by: Oletta Lamas Pre-anesthesia Checklist: Patient identified, Emergency Drugs available, Suction available and Patient being monitored Patient Re-evaluated:Patient Re-evaluated prior to inductionOxygen Delivery Method: Circle System Utilized Preoxygenation: Pre-oxygenation with 100% oxygen Intubation Type: IV induction Ventilation: Mask ventilation without difficulty Laryngoscope Size: Mac, McGraph, 4 and 3 Grade View: Grade II Tube type: Oral Number of attempts: 2 Airway Equipment and Method: Stylet and Oral airway Placement Confirmation: ETT inserted through vocal cords under direct vision,  positive ETCO2 and breath sounds checked- equal and bilateral Secured at: 23 cm Tube secured with: Tape Dental Injury: Teeth and Oropharynx as per pre-operative assessment

## 2017-04-07 NOTE — ED Notes (Signed)
Pt's BP dropped (133/73) Cardene drip adjusted

## 2017-04-07 NOTE — Code Documentation (Signed)
40 y.o. Female w/ PMHx of HTN who woke up this morning around 0830 w/ a severe headache. Latter she states she went and laid down on her couch and was very diaphoretic. She then states she had 1 episode of emesis and then had a LOC w/ associated fall. Length of LOC unknown. Husband found her and called EMS. [facility] appreciated LLE weakness and reporting elevated BP. Code stroke called by EMS. On arrival to [facility], pt was met by the stroke team. Labs were drawn and airway cleared. Pt to CT3. CT showing diffuse subarachnoid blood throughout the basilar cisterns. ASPECTS 10. NIHSS 1 for left foot weakness. See EMR for NIHSS and code stroke times. Pt reporting 8/10 headache and blurry vision. Pt w/ hematoma to left eye preventing left eye from opening.   BP to be <160 per Neuro MD. BP at this time within parameters. Decision to attain CTA and CT maxillofacial made. Pt taken back to the ED awaiting CT2 availability. Delay in CTA d/t CT2 being occupied. CTA results pending at this time. Nsx consult. Pt taken back to ED. BP elevated at this time. 20 mg IV labetalol given w/ resultant lowering of BP back to within parameters. BP monitored frequently. BP above parameters and Cardene gtt started. Pt monitored closely and frequently. Bedside handoff w/ ED RN Alexis.  

## 2017-04-08 ENCOUNTER — Inpatient Hospital Stay (HOSPITAL_COMMUNITY): Payer: BLUE CROSS/BLUE SHIELD

## 2017-04-08 ENCOUNTER — Encounter (HOSPITAL_COMMUNITY): Payer: Self-pay | Admitting: Neurosurgery

## 2017-04-08 DIAGNOSIS — J9601 Acute respiratory failure with hypoxia: Secondary | ICD-10-CM

## 2017-04-08 DIAGNOSIS — I608 Other nontraumatic subarachnoid hemorrhage: Secondary | ICD-10-CM

## 2017-04-08 DIAGNOSIS — I609 Nontraumatic subarachnoid hemorrhage, unspecified: Secondary | ICD-10-CM

## 2017-04-08 DIAGNOSIS — J690 Pneumonitis due to inhalation of food and vomit: Secondary | ICD-10-CM

## 2017-04-08 LAB — POCT I-STAT TROPONIN I: Troponin i, poc: 0.16 ng/mL (ref 0.00–0.08)

## 2017-04-08 LAB — POCT I-STAT 3, ART BLOOD GAS (G3+)
ACID-BASE DEFICIT: 8 mmol/L — AB (ref 0.0–2.0)
Bicarbonate: 16.4 mmol/L — ABNORMAL LOW (ref 20.0–28.0)
O2 Saturation: 90 %
PCO2 ART: 27.7 mmHg — AB (ref 32.0–48.0)
PH ART: 7.379 (ref 7.350–7.450)
PO2 ART: 58 mmHg — AB (ref 83.0–108.0)
TCO2: 17 mmol/L (ref 0–100)

## 2017-04-08 LAB — URINALYSIS, ROUTINE W REFLEX MICROSCOPIC
BILIRUBIN URINE: NEGATIVE
KETONES UR: NEGATIVE mg/dL
Leukocytes, UA: NEGATIVE
Nitrite: NEGATIVE
Protein, ur: NEGATIVE mg/dL
SQUAMOUS EPITHELIAL / LPF: NONE SEEN
Specific Gravity, Urine: 1.014 (ref 1.005–1.030)
pH: 5 (ref 5.0–8.0)

## 2017-04-08 LAB — MRSA PCR SCREENING: MRSA by PCR: NEGATIVE

## 2017-04-08 LAB — POCT I-STAT, CHEM 8
BUN: 10 mg/dL (ref 6–20)
CALCIUM ION: 1.04 mmol/L — AB (ref 1.15–1.40)
CREATININE: 0.6 mg/dL (ref 0.44–1.00)
Chloride: 107 mmol/L (ref 101–111)
Glucose, Bld: 140 mg/dL — ABNORMAL HIGH (ref 65–99)
HCT: 37 % (ref 36.0–46.0)
HEMOGLOBIN: 12.6 g/dL (ref 12.0–15.0)
POTASSIUM: 4 mmol/L (ref 3.5–5.1)
Sodium: 137 mmol/L (ref 135–145)
TCO2: 23 mmol/L (ref 0–100)

## 2017-04-08 LAB — HIV ANTIBODY (ROUTINE TESTING W REFLEX): HIV Screen 4th Generation wRfx: NONREACTIVE

## 2017-04-08 LAB — PROCALCITONIN: Procalcitonin: 0.16 ng/mL

## 2017-04-08 LAB — GLUCOSE, CAPILLARY: GLUCOSE-CAPILLARY: 138 mg/dL — AB (ref 65–99)

## 2017-04-08 MED ORDER — RESOURCE THICKENUP CLEAR PO POWD
ORAL | Status: DC | PRN
  Filled 2017-04-08: qty 125

## 2017-04-08 MED ORDER — ORAL CARE MOUTH RINSE
15.0000 mL | Freq: Two times a day (BID) | OROMUCOSAL | Status: DC
  Administered 2017-04-08 – 2017-04-09 (×5): 15 mL via OROMUCOSAL

## 2017-04-08 MED ORDER — LABETALOL HCL 5 MG/ML IV SOLN
10.0000 mg | INTRAVENOUS | Status: DC | PRN
  Administered 2017-04-08 – 2017-04-09 (×4): 10 mg via INTRAVENOUS
  Filled 2017-04-08 (×4): qty 4

## 2017-04-08 MED ORDER — FENTANYL CITRATE (PF) 100 MCG/2ML IJ SOLN
INTRAMUSCULAR | Status: AC
  Administered 2017-04-08: 50 ug
  Filled 2017-04-08: qty 2

## 2017-04-08 NOTE — Anesthesia Postprocedure Evaluation (Signed)
Anesthesia Post Note  Patient: Hospital doctor F Corpus  Procedure(s) Performed: Procedure(s) (LRB): RADIOLOGY WITH ANESTHESIA (N/A)  Patient location during evaluation: PACU Anesthesia Type: General Level of consciousness: awake Pain management: pain level controlled Vital Signs Assessment: post-procedure vital signs reviewed and stable Respiratory status: spontaneous breathing, nonlabored ventilation, respiratory function stable and patient connected to nasal cannula oxygen Cardiovascular status: stable Postop Assessment: no signs of nausea or vomiting Anesthetic complications: no       Last Vitals:  Vitals:   04/08/17 0715 04/08/17 0800  BP: (!) 144/74   Pulse: 93   Resp: 20   Temp:  36.9 C    Last Pain:  Vitals:   04/08/17 0800  TempSrc: Axillary  PainSc:                  [name] Barnett

## 2017-04-08 NOTE — Progress Notes (Signed)
PT Cancellation Note  Patient Details Name: MESHA SCHAMBERGER MRN: [id number] DOB: 12-Jul-1976   Cancelled Treatment:    Reason Eval/Treat Not Completed: Medical issues which prohibited therapy; just s/p ventricular drain this AM.  Will attempt tomorrow.   Elray Mcgregor 04/08/2017, 10:46 AM  Sheran Lawless, PT [phone] 04/08/2017

## 2017-04-08 NOTE — Evaluation (Signed)
Clinical/Bedside Swallow Evaluation Patient Details  Name: [name] Barnett MRN: [id number] Date of Birth: [birth year]  Today's Date: 04/08/2017 Time: SLP Start Time (ACUTE ONLY): 1216 SLP Stop Time (ACUTE ONLY): 1240 SLP Time Calculation (min) (ACUTE ONLY): 24 min  Past Medical History:  Past Medical History:  Diagnosis Date  . Duodenal obstruction   . GERD (gastroesophageal reflux disease)   . Hypertension    Past Surgical History:  Past Surgical History:  Procedure Laterality Date  . BALLOON DILATION  12/31/2011   Procedure: BALLOON DILATION;  Surgeon: [name] Folk, MD;  Location: [facility] ENDOSCOPY;  Service: Endoscopy;  Laterality: N/A;  . CHOLECYSTECTOMY  07/28/11  . ESOPHAGOGASTRODUODENOSCOPY  10/17/2011   Procedure: ESOPHAGOGASTRODUODENOSCOPY (EGD);  Surgeon: [name] Folk, MD;  Location: [facility] ENDOSCOPY;  Service: Endoscopy;  Laterality: N/A;  . RADIOLOGY WITH ANESTHESIA N/A 04/07/2017   Procedure: RADIOLOGY WITH ANESTHESIA;  Surgeon: [name] Renshaw, MD;  Location: [facility] OR;  Service: Radiology;  Laterality: N/A;  . TEMPOROMANDIBULAR JOINT SURGERY     2 surgeries  . VAGOTOMY  01/23/2012   Procedure: VAGOTOMY, antrectomy and BII;  Surgeon: [name] Paris, MD;  Location: [facility] OR;  Service: General;  Laterality: N/A;  Laparotomy with vagotomy.   HPI:  41 year old female admitted on 4/29 with acute aneurysmal SAH s/p coiling. 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 Pt initially passed the stroke swallow screen but was observed to be coughing with thin liquid intake.    Assessment / Plan / Recommendation Clinical Impression  Pt has immediate throat clearing with trials of thin liquids and is unable to complete 3 oz water test without overt coughing. Nectar thick liquids and solids are consumed without overt signs of aspiration, although she does have mild anterior spillage of liquids. Given the above as well as pt's drowsiness and medical change  overnight, recommend Dys 3 diet and nectar thick liquids with close monitoring for tolerance. Will f/u on next date for possible need for instrumental testing if signs of dysphagia persist. SLP Visit Diagnosis: Dysphagia, unspecified (R13.10)    Aspiration Risk  Mild aspiration risk    Diet Recommendation Dysphagia 3 (Mech soft);Nectar-thick liquid   Liquid Administration via: Cup;Straw Medication Administration: Whole meds with puree Supervision: Patient able to self feed;Full supervision/cueing for compensatory strategies Compensations: Slow rate;Small sips/bites Postural Changes: Seated upright at 90 degrees    Other  Recommendations Oral Care Recommendations: Oral care BID Other Recommendations: Order thickener from pharmacy;Prohibited food (jello, ice cream, thin soups);Remove water pitcher   Follow up Recommendations  (tba)      Frequency and Duration min 2x/week  2 weeks       Prognosis Prognosis for Safe Diet Advancement: Good      Swallow Study   General HPI: 41 year old female admitted on 4/29 with acute aneurysmal SAH s/p coiling. 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 Pt initially passed the stroke swallow screen but was observed to be coughing with thin liquid intake.  Type of Study: Bedside Swallow Evaluation Previous Swallow Assessment: none in chart Diet Prior to this Study: Regular;Thin liquids Temperature Spikes Noted: No Respiratory Status: Nasal cannula History of Recent Intubation: Yes Length of Intubations (days):  (for procedure only) Date extubated: 04/07/17 Behavior/Cognition: Other (Comment);Cooperative;Pleasant mood (drowsy) Oral Cavity Assessment: Within Functional Limits Oral Care Completed by SLP: Yes Oral Cavity - Dentition: Adequate natural dentition Vision: Functional for self-feeding Self-Feeding Abilities:  Able to feed self Patient Positioning: Upright in bed Baseline Vocal Quality:  Normal Volitional Cough: Strong    Oral/Motor/Sensory Function     Ice Chips Ice chips: Within functional limits   Thin Liquid Thin Liquid: Impaired Presentation: Cup;Self Fed;Straw Oral Phase Impairments: Reduced labial seal Oral Phase Functional Implications: Right anterior spillage Pharyngeal  Phase Impairments: Throat Clearing - Immediate;Cough - Immediate    Nectar Thick Nectar Thick Liquid: Impaired Presentation: Cup;Self Fed;Straw Oral Phase Impairments: Reduced labial seal Oral phase functional implications: Right anterior spillage   Honey Thick Honey Thick Liquid: Not tested   Puree Puree: Within functional limits Presentation: Self Fed;Spoon   Solid   GO   Solid: Within functional limits Presentation: Self [name] Barnett 04/08/2017,4:39 PM  [name] Barnett, M.A. CCC-SLP [phone]

## 2017-04-08 NOTE — Progress Notes (Signed)
Pt seen and examined. Pt was found to be more lethargic this am. CT head was obtained.  EXAM: Temp:  [97 F (36.1 C)-98.4 F (36.9 C)] 98.4 F (36.9 C) (04/30 0800) Pulse Rate:  [65-101] 85 (04/30 1000) Resp:  [10-35] 14 (04/30 1000) BP: (111-177)/(53-93) 113/53 (04/30 1000) SpO2:  [88 %-100 %] 96 % (04/30 1000) Arterial Line BP: (142-184)/(54-67) 184/64 (04/30 0715) Weight:  [65.8 kg (145 lb 1 oz)-66.1 kg (145 lb 11.6 oz)] 65.8 kg (145 lb 1 oz) (04/29 1815) Intake/Output      04/29 0701 - 04/30 0700 04/30 0701 - 05/01 0700   I.V. (mL/kg) 3507.5 (53.3)    Total Intake(mL/kg) 3507.5 (53.3)    Urine (mL/kg/hr) 1950    Total Output 1950     Net +1557.5           Drowsy be easily arousable.  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 No new hemorrhage  IMPRESSION: - 41 y.o. 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

## 2017-04-08 NOTE — Progress Notes (Addendum)
OT Cancellation Note  Patient Details Name: [name] Barnett MRN: [id number] DOB: 06-25-1976   Cancelled Treatment:    Reason Eval/Treat Not Completed: Patient not medically ready. Pt s/p ventricular drain placement this am. Will check back as able and appropriate for OT evaluation.  Doristine Section, MS OTR/L  Pager: [phone]   Doristine Section 04/08/2017, 10:46 AM

## 2017-04-08 NOTE — Progress Notes (Signed)
Transcranial Doppler  Date POD PCO2 HCT BP  MCA ACA PCA OPHT SIPH VERT Basilar  4/30/ rds     Right  Left   73  75   -67  -57   50  47   52  26   46  77   -58  -40   -52           Right  Left                                            Right  Left                                             Right  Left                                             Right  Left                                            Right  Left                                            Right  Left                                        MCA = Middle Cerebral Artery      OPHT = Opthalmic Artery     BASILAR = Basilar Artery   ACA = Anterior Cerebral Artery     SIPH = Carotid Siphon PCA = Posterior Cerebral Artery   VERT = Verterbral Artery                   Normal MCA = 62+\-12 ACA = 50+\-12 PCA = 42+\-23   04/08/17 - Lindegaard Ratio - Right 2.2, Left 2.4

## 2017-04-08 NOTE — Procedures (Signed)
PROCEDURE: Right frontal ventriculostomy   SURGEON: Dr. Lisbeth Renshaw, MD  ANESTHESIA: IV Fentanyl with Local  EBL: Minimal  SPECIMENS: None  COMPLICATIONS: None  CONDITION: Hemodynamically stable  INDICATIONS: Mrs. [name] Barnett is a 41 y.o. female admitted yesterday with SAH and underwent coiling of basilar aneurysm. She developed lethargy and increasingly labored breathing. CT demonstrated possible left SCA stroke with edema and increasing ventriculomegaly. EVD was therefore indicated. Risks and benefits were reviewed with the patient's husband including but not limited to intracranial bleeding and infection. All questions were answered and consent was obtained.  PROCEDURE IN DETAIL: After consent was obtained from the patient's family, skin of the right frontal scalp was clipped, prepped and draped in the usual sterile fashion.  Scalp was then infiltrated with local anesthetic with epinephrine.  Skin incision was made sharply, and twist drill burr hole was made.  The dura was then incised, and the ventricular catheter was passed in one attempt into the right lateral ventricle.  Good CSF flow was obtained.  The catheter was then tunneled subcutaneously and connected to a drainage system and the skin incision closed.  The drain was then secured in place.  FINDINGS: 1. Opening pressure >20cm H2O 2. Bloody CSF returned

## 2017-04-08 NOTE — Consult Note (Signed)
PULMONARY / CRITICAL CARE MEDICINE   Name: [name] Barnett MRN: [id number] DOB: [birth year]    ADMISSION DATE:  04/07/2017 CONSULTATION DATE:  04/08/2017  REFERRING MD:  Dr. Conchita Paris  CHIEF COMPLAINT:  SAH  HISTORY OF PRESENT ILLNESS:  The information is obtained from a thorough medical chart review and from the patient.   41 year old female admitted on 4/29 with acute aneurysmal SAH.   Past medical history as presented below.  On 4/29, she woke up with a mild occipital headache that acutely worsened that morning with blurry vision.  She vomited, loss consciousness, and fell hitting her head.  She presented to the ER, awake as a code stroke with LLE weakness and elevated BP.  NIHSS was 1 for left foot weakness.  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  ASPECTS 10.  She was taken to neuro IR for coil embolization on 4/29 and extubated post procedure in PACU.  Overnight, she became more lethargic.  She developed increased work of breathing and hypoxia with desaturations on room air in 80% that improved to mid 90's on [place] O2.  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  An ventriculostomy drain was placed this morning by neurosurgery with improvement in her level of consciousness.  ABG 7.379/ 27.7/ 58 on 4L [place].  PCCM asked to evaluate given concern for airway protection.    PAST MEDICAL HISTORY :  She  has a past medical history of Duodenal obstruction; GERD (gastroesophageal reflux disease); and Hypertension.  PAST SURGICAL HISTORY: She  has a past surgical history that includes Cholecystectomy (07/28/11); Temporomandibular joint surgery; Esophagogastroduodenoscopy (10/17/2011); Balloon dilation (12/31/2011); and Vagotomy (01/23/2012).  Allergies  Allergen Reactions  . 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    Current Outpatient Prescriptions on File Prior to Encounter  Medication Sig  . feeding supplement (RESOURCE BREEZE) LIQD Take 1 Container by mouth 2 (two) times daily with a meal. (Patient not taking: Reported on 04/07/2017)  . pantoprazole (PROTONIX) 40 MG tablet Take 1 tablet (40 mg total) by mouth 2 (two) times daily before a meal.    FAMILY HISTORY:  Her indicated that her mother is alive. She indicated that her father is alive. She indicated that the status of her neg hx is unknown.    SOCIAL HISTORY: She  reports that she has been smoking Cigarettes.  She has a 20.00 pack-year smoking history. She has never used smokeless tobacco. She reports that she does not drink alcohol or use drugs.  REVIEW OF SYSTEMS:  POSITIVES IN BOLD Gen: Denies fever, chills, weight change, fatigue, night sweats HEENT: Denies blurred vision, double vision, hearing loss, tinnitus, sinus congestion, rhinorrhea, sore throat, neck stiffness, dysphagia PULM: Denies shortness of breath, cough, sputum production, hemoptysis, wheezing CV: Denies chest pain, edema, orthopnea, paroxysmal nocturnal dyspnea, palpitations GI: Denies abdominal pain, nausea, vomiting, diarrhea, hematochezia, melena, constipation, change in bowel habits GU: Denies dysuria, hematuria, polyuria, oliguria, urethral discharge Endocrine: Denies hot or cold intolerance, polyuria, polyphagia or appetite change Derm: Denies rash, dry skin, scaling or peeling skin change Heme: Denies easy bruising, bleeding, bleeding gums Neuro: Denies headache, numbness, weakness, slurred speech, loss of memory or consciousness, neck pain  SUBJECTIVE:  Complains of continued headache.  VITAL SIGNS:  BP (!) 113/53   Pulse 85   Temp 98.4 F (36.9 C) (Axillary)   Resp 14   Ht 5\' 4"  (1.626 m)   Wt 145 lb 1 oz (65.8 kg)   SpO2 96%   BMI 24.90 kg/m   HEMODYNAMICS:    VENTILATOR SETTINGS:     INTAKE / OUTPUT: I/O last 3 completed shifts: In: 3507.5 [I.V.:3507.5] Out: 1950 [Urine:1950]  PHYSICAL EXAMINATION: General:  Adult female, awake sitting in bed in NAD HEENT: MM pink/dry, pupils 4/=/brisk, mild swelling/ eccomyochosis to left orbit, EVD to right frontal  Neuro: Awake, AOx3, MAE, no focal weakness, speech clear  CV: s1s2 rrr, no m/r/g, ST 102 PULM: even/non-labored, lungs bilaterally clear, diminished in the bases ZO:XWRU, non-tender, bsx4 active  Extremities: warm/dry, no edema, right groin site wnl Skin: no rashes or lesions   LABS:  BMET  Recent Labs Lab 04/07/17 1132 04/07/17 1137  NA 135 137  K 4.1 4.0  CL 102 107  CO2 20*  --   BUN 8 10  CREATININE 0.70 0.60  GLUCOSE 132* 140*    Electrolytes  Recent Labs Lab 04/07/17 1132  CALCIUM 8.6*    CBC  Recent Labs Lab 04/07/17 1132 04/07/17 1137 04/07/17 1900  WBC 16.7*  --  19.5*  HGB 11.1* 12.6 10.5*  HCT 36.4 37.0 34.0*  PLT 368  --  369    Coag's  Recent Labs Lab 04/07/17 1132 04/07/17 1900  APTT  --  49*  INR 0.89 0.98    Sepsis Markers No results for input(s): LATICACIDVEN, PROCALCITON, O2SATVEN in the last 168 hours.  ABG  Recent Labs Lab 04/08/17 0933  PHART 7.379  PCO2ART 27.7*  PO2ART 58.0*    Liver Enzymes No results for input(s): AST, ALT, ALKPHOS, BILITOT, ALBUMIN in the last 168 hours.  Cardiac Enzymes No results for input(s): TROPONINI, PROBNP in the last 168 hours.  Glucose  Recent Labs Lab 04/07/17 1131  GLUCAP 138*    Imaging Ct Angio Head W Or Wo Contrast  Result Date: 04/07/2017 CLINICAL DATA:  Severe headache with subsequent fall. EXAM: CT ANGIOGRAPHY HEAD AND NECK TECHNIQUE: Multidetector CT imaging of the head and neck was performed using the standard protocol during bolus administration of intravenous contrast. Multiplanar CT image reconstructions and MIPs were obtained to evaluate the vascular anatomy. Carotid stenosis  measurements (when applicable) are obtained utilizing NASCET criteria, using the distal internal carotid diameter as the denominator. CONTRAST:  50 mL Isovue 370. COMPARISON:  CT head reported separately. FINDINGS: CTA NECK Aortic arch: Standard branching. Imaged portion shows no evidence of aneurysm or dissection. No significant stenosis of the major arch vessel origins. Right carotid system: Minor atheromatous change. Slight beading and irregularity of the cervical ICA, consistent with mild fibromuscular dysplasia, no dissection. No evidence of dissection, stenosis (50% or greater) or occlusion. Left carotid system: Minor atheromatous change. No evidence of dissection, stenosis (50% or greater) or occlusion. Slight beading and irregularity of the cervical ICA, consistent with mild fibromuscular dysplasia. No dissection. Vertebral arteries: RIGHT vertebral dominant. No evidence of dissection, stenosis (50% or greater) or occlusion. Nonvascular soft tissues:  Described separately. CTA HEAD Anterior circulation: Unremarkable. Minor calcific atheromatous change. No significant stenosis, proximal occlusion, aneurysm, or vascular malformation. Posterior circulation: There is a moderately wide necked saccular aneurysm projecting leftward and slightly anterior, originating from the basilar artery, at the LEFT PCA origin, 6 x 8 x 4 mm. No significant stenosis, proximal occlusion, or vascular malformation. Venous sinuses: As  permitted by contrast timing, patent. Anatomic variants: None of significance. Delayed phase:   No abnormal intracranial enhancement. Review of the MIP images confirms the above findings. 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 6 x 8 x 4 mm. Neurointerventional consultation is warranted. Findings discussed with Dr. Amada Jupiter at 12:06 p.m. 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 Electronically Signed   By: Elsie Stain M.D.   On: 04/07/2017 12:32   Ct Head Wo Contrast  Result Date: 04/08/2017 CLINICAL DATA:  Lethargy. Status post ruptured basilar artery aneurysm and coil embolization. EXAM: CT HEAD WITHOUT CONTRAST TECHNIQUE: Contiguous axial images were obtained from the base of the skull through the vertex without intravenous contrast. COMPARISON:  CT HEAD April 07, 2017 FINDINGS: Moderately motion degraded examination. BRAIN: Mild, new hydrocephalus with dependent blood products in the occipital horns. Diffuse sulcal effacement with moderate residual subarachnoid hemorrhage. No intraparenchymal hemorrhage, midline shift. No acute large vascular territory infarct. Effaced basal cisterns. Mild cerebellar tonsillar ectopia, progressed from prior examination. VASCULAR: Coil embolization at the level of the basilar tip. SKULL/SOFT TISSUES: No skull fracture. No significant soft tissue swelling. ORBITS/SINUSES: The included ocular globes and orbital contents are normal.The mastoid aircells and included paranasal sinuses are well-aerated. OTHER: None. IMPRESSION: Moderately motion degraded examination. Moderate subarachnoid hemorrhage with re- distributed intraventricular blood products and early hydrocephalus. Mild global edema. Status post coil embolization of basilar tip aneurysm. These results will be called to the ordering clinician or representative by the Radiologist Assistant, and communication documented in the [facility] Dashboard. Electronically Signed   By: Awilda Metro M.D.   On: 04/08/2017 06:45   Ct Angio Neck W And/or Wo Contrast  Result Date: 04/07/2017 CLINICAL DATA:  Severe headache with subsequent fall. EXAM: CT ANGIOGRAPHY HEAD AND NECK TECHNIQUE: Multidetector CT imaging of the head and neck was performed using the standard protocol during bolus administration of intravenous contrast. Multiplanar CT image reconstructions and MIPs were  obtained to evaluate the vascular anatomy. Carotid stenosis measurements (when applicable) are obtained utilizing NASCET criteria, using the distal internal carotid diameter as the denominator. CONTRAST:  50 mL Isovue 370. COMPARISON:  CT head reported separately. FINDINGS: CTA NECK Aortic arch: Standard branching. Imaged portion shows no evidence of aneurysm or dissection. No significant stenosis of the major arch vessel origins. Right carotid system: Minor atheromatous change. Slight beading and irregularity of the cervical ICA, consistent with mild fibromuscular dysplasia, no dissection. No evidence of dissection, stenosis (50% or greater) or occlusion. Left carotid system: Minor atheromatous change. No evidence of dissection, stenosis (50% or greater) or occlusion. Slight beading and irregularity of the cervical ICA, consistent with mild fibromuscular dysplasia. No dissection. Vertebral arteries: RIGHT vertebral dominant. No evidence of dissection, stenosis (50% or greater) or occlusion. Nonvascular soft tissues:  Described separately. CTA HEAD Anterior circulation: Unremarkable. Minor calcific atheromatous change. No significant stenosis, proximal occlusion, aneurysm, or vascular malformation. Posterior circulation: There is a moderately wide necked saccular aneurysm projecting leftward and slightly anterior, originating from the basilar artery, at the LEFT PCA origin, 6 x 8 x 4 mm. No significant stenosis, proximal occlusion, or vascular malformation. Venous sinuses: As permitted by contrast timing, patent. Anatomic variants: None of significance. Delayed phase:   No abnormal intracranial enhancement. Review of the MIP images confirms the above findings. IMPRESSION: Aneurysmal subarachnoid hemorrhage secondary to a moderately wide necked saccular aneurysm arising from the basilar artery at  the LEFT PCA origin. 6 x 8 x 4 mm. Neurointerventional consultation is warranted. Findings discussed with Dr. Amada Jupiter  at 12:06 p.m. 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 Electronically Signed   By: Elsie Stain M.D.   On: 04/07/2017 12:32   Ct C-spine No Charge  Result Date: 04/07/2017 CLINICAL DATA:  Severe headache and dizziness followed by a fall. Large facial hematoma. EXAM: CT CERVICAL SPINE WITHOUT CONTRAST TECHNIQUE: Multidetector CT imaging of the cervical spine was performed without intravenous contrast. Multiplanar CT image reconstructions were also generated. COMPARISON:  CTA head neck reported separately. CT maxillofacial reported separately. FINDINGS: Alignment: Straightening of the normal cervical lordosis. No traumatic subluxation. Skull base and vertebrae: No acute fracture. No primary bone lesion or focal pathologic process. Soft tissues and spinal canal: No prevertebral fluid or swelling. No visible canal hematoma. Disc levels: No disc protrusion or stenosis. No significant disc space narrowing. Upper chest: No pneumothorax or upper rib fracture. Other: No neck masses of significance. IMPRESSION: Slight straightening of the cervical lordosis could be positional or due to spasm. There is no cervical spine fracture or traumatic subluxation Electronically Signed   By: Elsie Stain M.D.   On: 04/07/2017 12:34   Dg Chest Port 1 View  Result Date: 04/08/2017 CLINICAL DATA:  Respiratory its distress, stroke symptoms EXAM: PORTABLE CHEST 1 VIEW COMPARISON:  Portable chest x-ray of November 23, 2012 FINDINGS: The lungs are well-expanded. The infrahilar lung markings on the right are coarse. The heart and pulmonary vascularity are normal. The mediastinum is normal in width. The trachea is midline. The bony thorax exhibits no acute abnormality. IMPRESSION: Patchy right infrahilar density suggests subsegmental atelectasis or early infiltrate. No CHF or pleural effusion. Electronically Signed   By: David  [place] M.D.   On: 04/08/2017 07:20   Ct Head Code Stroke W/o Cm  Result  Date: 04/07/2017 CLINICAL DATA:  Code stroke.  Headaches. EXAM: CT HEAD WITHOUT CONTRAST TECHNIQUE: Contiguous axial images were obtained from the base of the skull through the vertex without intravenous contrast. COMPARISON:  CTA head neck is pending. FINDINGS: Brain: Diffuse subarachnoid blood throughout the basilar cisterns, greater than would be expected for simple head trauma. Intraventricular blood is also noted. No hydrocephalus or parenchymal hemorrhage. No focal extra-axial collection. No parenchymal hemorrhage. Vascular: No hyperdense vessel or unexpected calcification. Skull: Normal. Negative for fracture or focal lesion. Sinuses/Orbits: There is preseptal periorbital hematoma on the LEFT, but the globe is intact. Other: LEFT frontal scalp hematoma. ASPECTS [facility] Stroke Program Early CT Score) - Ganglionic level infarction (caudate, lentiform nuclei, internal capsule, insula, M1-M3 cortex): 7 - Supraganglionic infarction (M4-M6 cortex): 3 Total score (0-10 with 10 being normal): 10 IMPRESSION: 1. Diffuse subarachnoid blood throughout the basilar cisterns, consistent with aneurysmal SAH. CTA head neck is pending. 2. ASPECTS is 10. These results were called by telephone at the time of interpretation on 04/07/2017 at 12:09 pm to Dr. Amada Jupiter, who verbally acknowledged these results. Electronically Signed   By: Elsie Stain M.D.   On: 04/07/2017 12:10   Ct Maxillofacial Wo Contrast  Result Date: 04/07/2017 CLINICAL DATA:  Patient fell and hit face. EXAM: CT MAXILLOFACIAL WITHOUT CONTRAST TECHNIQUE: Multidetector CT imaging of the maxillofacial structures was performed. Multiplanar CT image reconstructions were also generated. A small metallic BB was placed on the right temple in order to reliably differentiate right from left. COMPARISON:  CTA head neck reported separately. CT cervical spine reported separately. FINDINGS: Osseous: No fracture or mandibular dislocation.  No destructive process.  Orbits: No intraorbital traumatic or inflammatory finding. LEFT preseptal periorbital soft tissue swelling related to the hematoma. No postseptal hematoma or globe injury. Sinuses: Clear.  No blowout injury. Soft tissues: LEFT facial soft tissue swelling. Limited intracranial: Reported separately. IMPRESSION: LEFT preseptal periorbital soft tissue swelling/hematoma. No postseptal hematoma, or globe injury.  No blowout fracture Electronically Signed   By: JohnElsie StainM.D.   On: 04/07/2017 12:38     STUDIES:  CTA head/ neck 4/29 > aneurysmal SAH secondary to basilar artery at the left PCA 6 x 8 x 4 mm ; mild changes suggesting bilateral cervical ICA fibromuscular dysplasia.  CT maxillofacial 4/29 > left periorbital soft tissue swelling/hematoma, neg fx  Head CT 4/30 > no new hemorrhage,  ? SCA territory stroke with some compression of the aqueduct and progressive ventriculomegaly.  PCXR 4/30 > patchy right infrahilar density either ? 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  Increasingly lethargic and hypoxic overnight.  EVD placed 4/30 for obstructive HCP.    ASSESSMENT / PLAN:  PULMONARY A: Acute hypoxic respiratory insufficiency - in the setting of SAH/ obstructive HCP - improved LOC after EVD-  Right hilar ? Atelectasis vs aspiration  Tobacco Abuse  P:   Monitor closely in ICU for respiratory compromise ABG and improved mental status reassuring  Wean O2 for sats > 92% CXR in am ABG prn  SLP pending Tobacco cessation counseling when appropiate    CARDIOVASCULAR A:  Hx HTN P:  ICU monitoring Cardene for goal SBP < 140 Labetalol prn   RENAL A:   No  acute issues  P:   NS @ 186ml/ hr  Trend BMP / urinary output Replace electrolytes as indicated Avoid nephrotoxic agents, ensure adequate renal perfusion  GASTROINTESTINAL A:   NPO Hx GERD, duodenal obstruction P:   SLP pending Protonix for SUP Colace for bowel regimen  HEMATOLOGIC A:   No acute issues  Chronic Normocytic anemia -  P:  Monitor CBC  INFECTIOUS A:   Leukocytosis - ? inflammatory vs infection  P:   Monitor fever curve/ WBC Check PCT  ENDOCRINE A:   No acute issues   P:   Monitor glucose on BMP  NEUROLOGIC A:   SAH basilar artery at left PCA origin s/p coiling on 4/29 Obstructive HCP 4/30- s/p EVD 4/30 P:   Management per NSGY EVD per NSGY Frequent neuro checks  Continue nimodipine for SBP  TCD pending Continue Cardene for SBP goals Minimize sedation as tolerated    FAMILY  - Updates:   - Inter-disciplinary family meet or Palliative Care meeting due by:  04/14/2017  CC time: 45 mins  Posey Boyer, AGACNP-BC [place] Pgr: [phone] or if no answer [phone] 04/08/2017, 11:18 AM  Attending Note:  41 year old female with ICH who presents to PCCM with AMS and increased ICP.  Patient had an IVD placed and with improvement of the mental status.  On exam, she is alert and interactive.  Moving all ext to command.  I reviewed CXR myself, concern for RLL infiltrate.  CXR in AM.  Pan culture.  Check PCT, no  abx for now unless febrile.  IS per RT protocol and mobilize as able.  SLP.  D/C BiPAP and monitor closely in the ICU given high risk for aspiration.  The patient is critically ill with multiple organ systems failure and requires high complexity decision making for assessment and support, frequent evaluation and titration of therapies, application of advanced monitoring technologies and extensive interpretation of multiple databases.   Critical Care Time devoted to patient care services described in this note is  45   Minutes. This time reflects time of care of this signee Dr Koren Bound. This critical care time does not reflect procedure time, or teaching time or supervisory time of PA/NP/Med student/Med Resident etc but could involve care discussion time.  Alyson Reedy, M.D. [facility] Pulmonary/Critical Care Medicine. Pager: [phone]. After hours pager: [phone].

## 2017-04-08 NOTE — Progress Notes (Signed)
Pt. Began to desat on RA at 0515 to low 80s, and needed more stimulation to arouse, but able to answer all questions and follow commands, labored breathing, diminished lung sounds upper bilat lobes. 2LO2 per nasal cannula and O2 sats increased to low 90s. NIH repeated at 0600 and pt. Unable to track with both eyes and more lethargic. MD made aware. RN transported pt. To CT and back to room. CT results called to MD. RN will continue to monitor.

## 2017-04-08 NOTE — Progress Notes (Signed)
eLink Physician-Brief Progress Note Patient Name: [name] Barnett DOB: [birth year] MRN: [id number]   Date of Service  04/08/2017  HPI/Events of Note  41 yo female admitted with HA, N/V, LOC from [facility].  Had coil embolization to basilar artery aneurysm.  Extubated in PACU.  Developed hypoxia overnight.     eICU Interventions  Will adjust oxygen to keep SpO2 > 92%.  Bipap as needed.  Get CXR and ABG.      Intervention Category Major Interventions: Other:  [name] 04/08/2017, 6:08 AM

## 2017-04-09 ENCOUNTER — Inpatient Hospital Stay (HOSPITAL_COMMUNITY): Payer: BLUE CROSS/BLUE SHIELD

## 2017-04-09 DIAGNOSIS — R0902 Hypoxemia: Secondary | ICD-10-CM

## 2017-04-09 DIAGNOSIS — E876 Hypokalemia: Secondary | ICD-10-CM

## 2017-04-09 DIAGNOSIS — J9811 Atelectasis: Secondary | ICD-10-CM

## 2017-04-09 LAB — BASIC METABOLIC PANEL
ANION GAP: 7 (ref 5–15)
BUN: 7 mg/dL (ref 6–20)
CHLORIDE: 110 mmol/L (ref 101–111)
CO2: 21 mmol/L — ABNORMAL LOW (ref 22–32)
Calcium: 8.5 mg/dL — ABNORMAL LOW (ref 8.9–10.3)
Creatinine, Ser: 0.48 mg/dL (ref 0.44–1.00)
GFR calc Af Amer: >60 mL/min (ref >60)
GLUCOSE: 133 mg/dL — AB (ref 65–99)
POTASSIUM: 3.4 mmol/L — AB (ref 3.5–5.1)
Sodium: 138 mmol/L (ref 135–145)

## 2017-04-09 LAB — CBC
HEMATOCRIT: 29.4 % — AB (ref 36.0–46.0)
HEMOGLOBIN: 9 g/dL — AB (ref 12.0–15.0)
MCH: 25.1 pg — ABNORMAL LOW (ref 26.0–34.0)
MCHC: 30.6 g/dL (ref 30.0–36.0)
MCV: 82.1 fL (ref 78.0–100.0)
Platelets: 328 10*3/uL (ref 150–400)
RBC: 3.58 MIL/uL — ABNORMAL LOW (ref 3.87–5.11)
RDW: 18.1 % — AB (ref 11.5–15.5)
WBC: 18.8 10*3/uL — ABNORMAL HIGH (ref 4.0–10.5)

## 2017-04-09 LAB — PROCALCITONIN: Procalcitonin: <0.1 ng/mL

## 2017-04-09 MED ORDER — CHLORHEXIDINE GLUCONATE CLOTH 2 % EX PADS
6.0000 | MEDICATED_PAD | Freq: Every day | CUTANEOUS | Status: DC
  Administered 2017-04-09 – 2017-04-11 (×3): 6 via TOPICAL

## 2017-04-09 MED ORDER — HYDROCODONE-ACETAMINOPHEN 5-325 MG PO TABS
1.0000 | ORAL_TABLET | ORAL | Status: DC | PRN
Start: 2017-04-09 — End: 2017-04-13
  Administered 2017-04-09 – 2017-04-13 (×21): 1 via ORAL
  Filled 2017-04-09 (×24): qty 1

## 2017-04-09 MED ORDER — SODIUM CHLORIDE 0.9% FLUSH
10.0000 mL | Freq: Two times a day (BID) | INTRAVENOUS | Status: DC
Start: 2017-04-09 — End: 2017-04-19
  Administered 2017-04-09: 10 mL
  Administered 2017-04-09: 20 mL
  Administered 2017-04-10 – 2017-04-11 (×4): 10 mL
  Administered 2017-04-12: 40 mL
  Administered 2017-04-12 – 2017-04-13 (×2): 10 mL
  Administered 2017-04-14: 20 mL
  Administered 2017-04-14 – 2017-04-16 (×5): 10 mL
  Administered 2017-04-17 – 2017-04-19 (×3): 20 mL

## 2017-04-09 MED ORDER — SODIUM CHLORIDE 0.9 % IV SOLN
30.0000 meq | Freq: Once | INTRAVENOUS | Status: AC
  Administered 2017-04-09: 30 meq via INTRAVENOUS
  Filled 2017-04-09: qty 15

## 2017-04-09 MED ORDER — LABETALOL HCL 5 MG/ML IV SOLN
10.0000 mg | INTRAVENOUS | Status: DC | PRN
Start: 2017-04-09 — End: 2017-04-17
  Administered 2017-04-09 – 2017-04-15 (×10): 10 mg via INTRAVENOUS
  Filled 2017-04-09 (×10): qty 4

## 2017-04-09 MED ORDER — SODIUM CHLORIDE 0.9% FLUSH
10.0000 mL | INTRAVENOUS | Status: DC | PRN
  Administered 2017-04-19: 20 mL
  Filled 2017-04-09: qty 40

## 2017-04-09 MED ORDER — LIDOCAINE 5 % EX PTCH
1.0000 | MEDICATED_PATCH | CUTANEOUS | Status: DC
  Administered 2017-04-09 – 2017-04-19 (×8): 1 via TRANSDERMAL
  Filled 2017-04-09 (×11): qty 1

## 2017-04-09 NOTE — Progress Notes (Signed)
Stopped by to visit w/ pt and husband in rm. She was sitting in a chair bedside and appeared to be sleeping, but her husband said she was awake. She opened her eyes and, when I introduced myself as chaplain, she thought I said "chocolate" and wanted some. Her husband said she could eat a variety of foods, but so far they'd only found vanilla pudding. When pt began speaking to her husband about how her head hurt, I withdrew. Will mention pt's desire for chocolate to her nurse, and return another time.   04/09/17 1100  Clinical Encounter Type  Visited With Patient and family together;Patient not available  Visit Type Initial;Psychological support;Spiritual support;Social support;Critical Care  Referral From Chaplain   Ephraim Hamburger, [place]

## 2017-04-09 NOTE — Progress Notes (Signed)
OT Cancellation Note  Patient Details Name: [name] Barnett MRN: [id number] DOB: 10/20/1976   Cancelled Treatment:    Reason Eval/Treat Not Completed: Patient not medically ready. Pt currently on bedrest. Will await increase in activity orders prior to OT evaluation. Thank you!  Doristine Section, MS OTR/L  Pager: [phone]   [name] Barnett 04/09/2017, 10:25 AM

## 2017-04-09 NOTE — Progress Notes (Signed)
Peripherally Inserted Central Catheter/Midline Placement  The IV Nurse has discussed with the patient and/or persons authorized to consent for the patient, the purpose of this procedure and the potential benefits and risks involved with this procedure.  The benefits include less needle sticks, lab draws from the catheter, and the patient may be discharged home with the catheter. Risks include, but not limited to, infection, bleeding, blood clot (thrombus formation), and puncture of an artery; nerve damage and irregular heartbeat and possibility to perform a PICC exchange if needed/ordered by physician.  Alternatives to this procedure were also discussed.  Bard Power PICC patient education guide, fact sheet on infection prevention and patient information card has been provided to patient /or left at bedside.    PICC/Midline Placement Documentation   Consent signed by Husband at bedside  Maximino Greenland 04/09/2017, 9:36 AM

## 2017-04-09 NOTE — Progress Notes (Signed)
PT Cancellation Note  Patient Details Name: [name] Barnett MRN: [id number] DOB: Oct 07, 1976   Cancelled Treatment:    Reason Eval/Treat Not Completed: Patient not medically ready Pt on bedrest. Will await increase in activity orders prior to PT evaluation.    Blake Divine A Zariana Strub 04/09/2017, 10:16 AM Mylo Red, PT, DPT [phone]

## 2017-04-09 NOTE — Evaluation (Signed)
Physical Therapy Evaluation Patient Details Name: [name] Barnett MRN: [id number] DOB: 1976/07/19 Today's Date: 04/09/2017   History of Present Illness  Pt is a 41 y.o. female admitted to ED on 04/07/17 with occipital headache, nausea/vomitting, and LOC leading to fall striking her head. 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 S/p coil embolization 4/29. 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 Pertinent PMH includes HTN, GERD.   Clinical Impression  Pt presents to PT with increased pain, blurred vision, decreased balance, and an overall decrease in functional mobility secondary to above. PTA, pt indep with all mobility, lives at home with husband and daughter; family can provide 24/7 assist if needed. Today, pt able to take steps towards bedside chair with bilat HHA and minA+2; further mobility limited secondary to increased suboccipital pain. Pt would benefit from continued acute PT services to maximize functional mobility and independence.    Follow Up Recommendations CIR;Supervision for mobility/OOB    Equipment Recommendations  Other (comment) (Defer to next venue)    Recommendations for Other Services OT consult     Precautions / Restrictions Precautions Precautions: Fall Precaution Comments: EVD Restrictions Weight Bearing Restrictions: No      Mobility  Bed Mobility Overal bed mobility: Needs Assistance Bed Mobility: Supine to Sit     Supine to sit: Min assist;HOB elevated     General bed mobility comments: MinA for UE support to pull into sitting.  Transfers Overall transfer level: Needs assistance Equipment used: 2 person hand held assist Transfers: Sit to/from Stand Sit to Stand: Min assist;+2 safety/equipment         General transfer comment: Increased neck pain in sitting  Ambulation/Gait Ambulation/Gait assistance: +2 safety/equipment;Min assist Ambulation Distance  (Feet): 2 Feet Assistive device: 2 person hand held assist Gait Pattern/deviations: Step-to pattern     General Gait Details: Took steps to bedside chair with bilat HHA and minA for balance; further mobility limited secondary to increased neck pain.  Stairs            Wheelchair Mobility    Modified Rankin (Stroke Patients Only) Modified Rankin (Stroke Patients Only) Pre-Morbid Rankin Score: No symptoms Modified Rankin: Moderately severe disability     Balance Overall balance assessment: Needs assistance Sitting-balance support: Bilateral upper extremity supported;Feet supported Sitting balance-Leahy Scale: Fair Sitting balance - Comments: Pt laying down and sitting up intermittently secondary to increased neck pain; min guard for balance     Standing balance-Leahy Scale: Poor Standing balance comment: Reliant on BUE support and minA for standing                             Pertinent Vitals/Pain Pain Assessment: 0-10 Pain Score: 7  Pain Location: neck Pain Descriptors / Indicators: Grimacing;Guarding Pain Intervention(s): Limited activity within patient's tolerance;Monitored during session;Repositioned;Heat applied    Home Living Family/patient expects to be discharged to:: Private residence Living Arrangements: Spouse/significant other;Children Available Help at Discharge: Family;Available 24 hours/day Type of Home: House Home Access: Stairs to enter Entrance Stairs-Rails: Can reach both Entrance Stairs-Number of Steps: 3 Home Layout: One level Home Equipment: Crutches Additional Comments: Husband works night; mother and mother-in-law live nearby and are able to help if needed.    Prior Function Level of Independence: Independent         Comments: Works as Airline pilot; Theatre stage manager  Extremity/Trunk Assessment   Upper Extremity Assessment Upper Extremity Assessment: Generalized weakness    Lower Extremity  Assessment Lower Extremity Assessment: Generalized weakness       Communication   Communication: No difficulties  Cognition Arousal/Alertness: Awake/alert Behavior During Therapy: WFL for tasks assessed/performed Overall Cognitive Status: Within Functional Limits for tasks assessed                                        General Comments      Exercises     Assessment/Plan    PT Assessment Patient needs continued PT services  PT Problem List Decreased strength;Decreased mobility;Pain;Decreased knowledge of use of DME;Decreased balance;Decreased activity tolerance       PT Treatment Interventions DME instruction;Therapeutic activities;Gait training;Therapeutic exercise;Patient/family education;Stair training;Balance training;Functional mobility training    PT Goals (Current goals can be found in the Care Plan section)  Acute Rehab PT Goals Patient Stated Goal: Return home PT Goal Formulation: With patient Time For Goal Achievement: 04/23/17 Potential to Achieve Goals: Good    Frequency Min 4X/week   Barriers to discharge        Co-evaluation               AM-PAC PT "6 Clicks" Daily Activity  Outcome Measure Difficulty turning over in bed (including adjusting bedclothes, sheets and blankets)?: Total Difficulty moving from lying on back to sitting on the side of the bed? : Total Difficulty sitting down on and standing up from a chair with arms (e.g., wheelchair, bedside commode, etc,.)?: Total Help needed moving to and from a bed to chair (including a wheelchair)?: Total Help needed walking in hospital room?: A Little Help needed climbing 3-5 steps with a railing? : A Lot 6 Click Score: 9    End of Session Equipment Utilized During Treatment: Gait belt Activity Tolerance: Patient limited by pain;Patient tolerated treatment well Patient left: in chair;with call bell/phone within reach;with chair alarm set;with family/visitor present Nurse  Communication: Mobility status PT Visit Diagnosis: Pain;Other abnormalities of gait and mobility (R26.89) Pain - part of body:  (Neck/head)    Time: 1610-9604 PT Time Calculation (min) (ACUTE ONLY): 24 min   Charges:   PT Evaluation $PT Eval Moderate Complexity: 1 Procedure PT Treatments $Therapeutic Activity: 8-22 mins   PT G Codes:       Dewayne Hatch, SPT Office-[phone]  [name] Barnett 04/09/2017, 1:10 PM

## 2017-04-09 NOTE — Evaluation (Signed)
Occupational Therapy Evaluation Patient Details Name: [name] Barnett MRN: [id number] DOB: [birth year] Today's Date: 04/09/2017    History of Present Illness Pt is a 41 y.o. female admitted to ED on 04/07/17 with occipital headache, nausea/vomitting, and LOC leading to fall striking her head. 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 S/p coil embolization 4/29. 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 Pertinent PMH includes HTN, GERD.    Clinical Impression   PTA, pt was independent with ADL and functional mobility, was working and living at home with her husband and daughter. Pt was able to complete stand-pivot toilet transfer with min assist +2 for safety, LB ADL with max assist, and UB ADL with min assist this session. She presents with blurred vision, double vision with L gaze, decreased balance, and generalized weakness impacting her ability to participate in ADL at [facility]. She was limited this session by neck and headache pain as well as dizziness. She would benefit from continued OT services while admitted to improve independence with ADL and functional mobility. Recommend CIR level therapies post-acute D/C in order to maximize return to PLOF. OT will continue to follow while admitted.    Follow Up Recommendations  CIR;Supervision/Assistance - 24 hour    Equipment Recommendations  None recommended by OT (TBD at next venue of care)    Recommendations for Other Services Rehab consult     Precautions / Restrictions Precautions Precautions: Fall Precaution Comments: EVD Restrictions Weight Bearing Restrictions: No      Mobility Bed Mobility Overal bed mobility: Needs Assistance Bed Mobility: Sit to Supine     Supine to sit: Min assist;HOB elevated Sit to supine: Supervision   General bed mobility comments: Supervision for safety and VC's to slow down to improve safety with  lines.  Transfers Overall transfer level: Needs assistance Equipment used: 1 person hand held assist Transfers: Sit to/from Stand Sit to Stand: Min assist;+2 safety/equipment         General transfer comment: Increased dizziness and neck pain in sitting    Balance Overall balance assessment: Needs assistance Sitting-balance support: Bilateral upper extremity supported;Feet supported Sitting balance-Leahy Scale: Fair Sitting balance - Comments: Pt moving quickly and slighlty impulsive due to dizziness.    Standing balance support: Bilateral upper extremity supported;Single extremity supported;During functional activity Standing balance-Leahy Scale: Poor Standing balance comment: Reliant on UE support and min assist for standing.                           ADL either performed or assessed with clinical judgement   ADL Overall ADL's : Needs assistance/impaired Eating/Feeding: Set up (sitting up in bed)   Grooming: Minimal assistance Grooming Details (indicate cue type and reason): Sitting up in bed; Dizziness impacting ability to sit without back support. Upper Body Bathing: Minimal assistance;Sitting (with back support)   Lower Body Bathing: Maximal assistance;Sit to/from stand Lower Body Bathing Details (indicate cue type and reason): Due to dizziness Upper Body Dressing : Minimal assistance;Sitting (with back support)   Lower Body Dressing: Maximal assistance;Sit to/from stand Lower Body Dressing Details (indicate cue type and reason): Due to dizziness Toilet Transfer: Minimal assistance;+2 for safety/equipment;Stand-pivot Toilet Transfer Details (indicate cue type and reason): One person handheld assist Toileting- Clothing Manipulation and Hygiene: Maximal assistance;Sit to/from stand       Functional mobility during ADLs: Minimal assistance;+2 for safety/equipment General ADL Comments: Pt  with significant dizziness and nausea with movement creating  instability.      Vision Patient Visual Report: Diplopia;Eye fatigue/eye pain/headache (When looking L) Vision Assessment?: Yes Eye Alignment: Impaired (comment) (disconjugate gaze slightly) Ocular Range of Motion: Impaired-to be further tested in functional context Alignment/Gaze Preference: Within Defined Limits Tracking/Visual Pursuits: Left eye does not track laterally;Left eye does not track medially (L eye does not track) Additional Comments: L eye with significant orbital region swelling. Pt with difficulty tracking with L eye and with diplopia with L gaze. Reports eye fatigue and difficulty keeping eyes open.     Perception     Praxis      Pertinent Vitals/Pain Pain Assessment: Faces Pain Score: 7  Faces Pain Scale: Hurts whole lot Pain Location: neck and head Pain Descriptors / Indicators: Grimacing;Guarding Pain Intervention(s): Limited activity within patient's tolerance;Monitored during session;Repositioned;Heat applied     Hand Dominance     Extremity/Trunk Assessment Upper Extremity Assessment Upper Extremity Assessment: Generalized weakness   Lower Extremity Assessment Lower Extremity Assessment: Generalized weakness       Communication Communication Communication: No difficulties   Cognition Arousal/Alertness: Awake/alert Behavior During Therapy: WFL for tasks assessed/performed Overall Cognitive Status: Within Functional Limits for tasks assessed                                     General Comments       Exercises     Shoulder Instructions      Home Living Family/patient expects to be discharged to:: Private residence Living Arrangements: Spouse/significant other;Children Available Help at Discharge: Family;Available 24 hours/day Type of Home: House Home Access: Stairs to enter [employer] of Steps: 3 Entrance Stairs-Rails: Can reach both Home Layout: One level     Bathroom Shower/Tub: Walk-in shower (and  Secondary school teacher)   Bathroom Toilet: Standard     Home Equipment: Crutches   Additional Comments: Husband works night; mother and mother-in-law live nearby and are able to help if needed.      Prior Functioning/Environment Level of Independence: Independent        Comments: Works as Airline pilot; drives        OT Problem List: Decreased strength;Decreased range of motion;Decreased activity tolerance;Impaired balance (sitting and/or standing);Impaired vision/perception;Decreased safety awareness;Decreased knowledge of use of DME or AE;Decreased knowledge of precautions;Pain;Impaired UE functional use      OT Treatment/Interventions: Self-care/ADL training;Therapeutic exercise;Energy conservation;DME and/or AE instruction;Therapeutic activities;Visual/perceptual remediation/compensation;Patient/family education;Balance training    OT Goals(Current goals can be found in the care plan section) Acute Rehab OT Goals Patient Stated Goal: Return home OT Goal Formulation: With patient/family Time For Goal Achievement: 04/23/17 Potential to Achieve Goals: Good ADL Goals Pt Will Perform Grooming: (P) with supervision;sitting Pt Will Perform Upper Body Dressing: (P) with supervision;sitting Pt Will Perform Lower Body Dressing: (P) with min assist;sit to/from stand Pt Will Transfer to Toilet: (P) with supervision;ambulating;regular height toilet Pt Will Perform Toileting - Clothing Manipulation and hygiene: (P) with supervision;sit to/from stand Pt/caregiver will Perform Home Exercise Program: (P) Both right and left upper extremity;Increased strength;With written HEP provided Additional ADL Goal #1: (P) Pt will demonstrate improved functional use of vision   OT Frequency: Min 2X/week   Barriers to D/C:            Co-evaluation              AM-PAC PT "6 Clicks" Daily Activity  Outcome Measure Help from another person eating meals?: None Help from another person taking care of  personal grooming?: A Little Help from another person toileting, which includes using toliet, bedpan, or urinal?: A Lot Help from another person bathing (including washing, rinsing, drying)?: A Lot Help from another person to put on and taking off regular upper body clothing?: A Little Help from another person to put on and taking off regular lower body clothing?: A Lot 6 Click Score: 16   End of Session Equipment Utilized During Treatment: Gait belt Nurse Communication: Mobility status  Activity Tolerance: Patient tolerated treatment well Patient left: in bed;with call bell/phone within reach;with family/visitor present  OT Visit Diagnosis: Unsteadiness on feet (R26.81);Low vision, both eyes (H54.2);Muscle weakness (generalized) (M62.81)                Time: 4010-2725 OT Time Calculation (min): 24 min Charges:  OT General Charges $OT Visit: 1 Procedure OT Evaluation $OT Eval Moderate Complexity: 1 Procedure OT Treatments $Self Care/Home Management : 8-22 mins G-Codes:     Doristine Section, MS OTR/L  Pager: [phone]   Gustabo Gordillo A Melynda Krzywicki 04/09/2017, 2:32 PM

## 2017-04-09 NOTE — Progress Notes (Signed)
  Speech Language Pathology Treatment: Dysphagia  Patient Details Name: [name] Barnett MRN: [id number] DOB: 20-Feb-1976 Today's Date: 04/09/2017 Time: 4098-1191 SLP Time Calculation (min) (ACUTE ONLY): 16 min  Assessment / Plan / Recommendation Clinical Impression  Pt shows improved alertness and reduced signs of dysphagia. Trace amounts of anterior spillage was noted x1 with thin liquids, and 1 immediate cough followed a sip when pt tipped her head back to finish the cup. Otherwise she showed no overt signs of aspiration even with challenging. She does report that some of her food has been too dry to manage comfortably. Recommend continuing Dys 3 diet but with advancement to thin liquids. Will continue to monitor closely.   HPI HPI: 41 year old female admitted on 4/29 with acute aneurysmal SAH s/p coiling. 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 Pt initially passed the stroke swallow screen but was observed to be coughing with thin liquid intake.       SLP Plan  Continue with current plan of care       Recommendations  Diet recommendations: Dysphagia 3 (mechanical soft);Thin liquid Liquids provided via: Cup;Straw Medication Administration: Whole meds with puree Supervision: Patient able to self feed;Full supervision/cueing for compensatory strategies Compensations: Slow rate;Small sips/bites Postural Changes and/or Swallow Maneuvers: Seated upright 90 degrees                Oral Care Recommendations: Oral care BID Follow up Recommendations: Inpatient Rehab SLP Visit Diagnosis: Dysphagia, unspecified (R13.10) Plan: Continue with current plan of care       GO                Maxcine Ham 04/09/2017, 4:44 PM  Maxcine Ham, M.A. CCC-SLP [phone]

## 2017-04-09 NOTE — Progress Notes (Signed)
Rehab Admissions Coordinator Note:  Patient was screened by [name] Barnett for appropriateness for an Inpatient Acute Rehab Consult per PT recommendation. Noted ventric. At this time, we are recommending Inpatient Rehab consult when medically appropriate.  [name] Barnett 04/09/2017, 1:11 PM  I can be reached at [phone].

## 2017-04-09 NOTE — Progress Notes (Signed)
PULMONARY / CRITICAL CARE MEDICINE   Name: [name] Barnett MRN: [id number] DOB: February 15, 1976    ADMISSION DATE:  04/07/2017 CONSULTATION DATE:  04/08/2017  REFERRING MD:  Dr. Conchita Paris  CHIEF COMPLAINT:  SAH  HISTORY OF PRESENT ILLNESS:  The information is obtained from a thorough medical chart review and from the patient.   41 year old female admitted on 4/29 with acute aneurysmal SAH.   Past medical history as presented below.  On 4/29, she woke up with a mild occipital headache that acutely worsened that morning with blurry vision.  She vomited, loss consciousness, and fell hitting her head.  She presented to the ER, awake as a code stroke with LLE weakness and elevated BP.  NIHSS was 1 for left foot weakness.  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  ASPECTS 10.  She was taken to neuro IR for coil embolization on 4/29 and extubated post procedure in PACU.  Overnight, she became more lethargic.  She developed increased work of breathing and hypoxia with desaturations on room air in 80% that improved to mid 90's on [place] O2.  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  An ventriculostomy drain was placed 4/30 by neurosurgery with improvement in her level of consciousness.  ABG 7.379/ 27.7/ 58 on 4L [place].  PCCM asked to evaluate given concern for airway protection.    SUBJECTIVE: Awake  alert and interactive , OOB in chair, answers questions appropriately.Continues to complain of neck pain and headache.   VITAL SIGNS: BP 119/62   Pulse 66   Temp 98.7 F (37.1 C) (Oral)   Resp 13   Ht  (1.626 m)   Wt 145 lb 1 oz (65.8 kg)   SpO2 99%   BMI 24.90 kg/m   HEMODYNAMICS:    INTAKE / OUTPUT: I/O last 3 completed shifts: In: 5307.5 [I.V.:4607.5; Other:700] Out: 3403 [Urine:3125; Drains:278]  PHYSICAL EXAMINATION: General:  Adult female, awake and alert, OOB in  chair, MAE x 4 HEENT: MM Pink and dry, pupils are  4/=/brisk, continued mild swelling/ eccomyochosis to left orbit, EVD to right frontal, dressing intact  Neuro: Awake, AOx 2, MAE x 4, no focal weakness, speech clear  CV: RRR, S1. S2, no RMG, Tele with rate of 70, NSR PULM: even/non-labored, Clear throughout, diminished per bases, RR 12 BJ:YNWG, non-tender,non-distended bsx4 active  Extremities: warm/dry, no edema, Right groin site intact Skin:Warm and dry, no rashes or lesions   LABS:  BMET  Recent Labs Lab 04/07/17 1132 04/07/17 1137 04/09/17 0711  NA 135 137 138  K 4.1 4.0 3.4*  CL 102 107 110  CO2 20*  --  21*  BUN CREATININE 0.70 0.60 0.48  GLUCOSE 132* 140* 133*    Electrolytes  Recent Labs Lab 04/07/17 1132 04/09/17 0711  CALCIUM 8.6* 8.5*    CBC  Recent Labs Lab 04/07/17 1132 04/07/17 1137 04/07/17 1900 04/09/17 0711  WBC 16.7*  --  19.5* 18.8*  HGB 11.1* 12.6 10.5* 9.0*  HCT 36.4 37.0 34.0* 29.4*  PLT 368  --  369 328    Coag's  Recent Labs Lab 04/07/17 1132 04/07/17 1900  APTT  --  49*  INR 0.89 0.98    Sepsis Markers  Recent Labs Lab 04/08/17 1400 04/09/17 0711  PROCALCITON 0.16 <0.10    ABG  Recent Labs  Lab 04/08/17 0933  PHART 7.379  PCO2ART 27.7*  PO2ART 58.0*    Liver Enzymes No results for input(s): AST, ALT, ALKPHOS, BILITOT, ALBUMIN in the last 168 hours.  Cardiac Enzymes No results for input(s): TROPONINI, PROBNP in the last 168 hours.  Glucose  Recent Labs Lab 04/07/17 1131  GLUCAP 138*    Imaging Dg Chest Port 1 View  Result Date: 04/09/2017 CLINICAL DATA:  Community-acquired pneumonia. History of hypertension, subarachnoid hemorrhage due to ruptured aneurysm, current smoker. EXAM: PORTABLE CHEST 1 VIEW COMPARISON:  Portable chest x-ray of April 08, 2017 FINDINGS: The lungs are well-expanded. The interstitial markings are coarse and have become more conspicuous in the infrahilar regions  bilaterally. The heart and pulmonary vascularity are normal. The mediastinum is normal in width. The trachea is midline. The bony thorax exhibits no acute abnormality. IMPRESSION: Slight interval increase in interstitial density bilaterally may reflect worsening atelectasis or early pneumonia. No overt evidence of CHF. Electronically Signed   By: David  [place]and M.D.   On: 04/09/2017 07:38   Dg Chest Port 1 View  Result Date: 04/08/2017 CLINICAL DATA:  Hypoxia EXAM: PORTABLE CHEST 1 VIEW COMPARISON:  04/08/2017 FINDINGS: Cardiac shadow is within normal limits. There is been some improved aeration in the right lung base when compared with the prior exam. No new focal infiltrate is seen. No sizable effusion is noted. IMPRESSION: Improving atelectasis in the right lung base. Electronically Signed   By: Alcide Clever M.D.   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  CT maxillofacial 4/29 > left periorbital soft tissue swelling/hematoma, neg fx  Head CT 4/30 > no new hemorrhage,  ? SCA territory stroke with some compression of the aqueduct and progressive ventriculomegaly.  PCXR 4/30 > patchy right infrahilar density either ? atelectasis or early infiltrate  PCXR 5/1> Slight interval increase in interstitial density bilaterally may reflect worsening atelectasis or early pneumonia.   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  Increasingly  lethargic and hypoxic overnight.  EVD placed 4/30 for obstructive HCP.    ASSESSMENT / PLAN:  PULMONARY A: Acute hypoxic respiratory insufficiency - in the setting of SAH/ obstructive HCP - improved LOC after EVD-  Right hilar ? Atelectasis vs aspiration  Tobacco Abuse  RA on 5/1 P:   Continue to monitor in ICU for airway protection/ compromise ABG prn worsening MS Wean O2 for sats > 92% CXR 5/2 SLP : Mild aspiration risk thin liquids: See diet recommendations below Tobacco cessation counseling when appropiate  Mobilize  Aggressive Pulmonary Toilet IS, Flutter Valve   CARDIOVASCULAR A:  Hx HTN P:  ICU monitoring Cardene for goal SBP < 140 Labetalol prn  Maintain MAP >65 mm HG  RENAL A:   Hypokalemia P:   NS @ 50 ml/ hr  Trend BMP / urinary output Replace electrolytes as indicated ( K repletion 5/1) Avoid nephrotoxic agents, ensure adequate renal  perfusion  GASTROINTESTINAL A:   NPO Hx GERD, duodenal obstruction P:   SLP Recommendation Dysphagia 3 diet with nectar thick liquids/ close supervision Protonix for SUP Colace for bowel regimen Aspiration precutions  HEMATOLOGIC A:   No acute issues  Chronic Normocytic anemia -  P:  Monitor CBC  INFECTIOUS A:   Leukocytosis - ? inflammatory vs infection  PCT <0.10 Afebrile  P:   Monitor fever curve/ WBC Trend PCT Follow Micro/ Cultures CXR 5/2  ENDOCRINE A:   No acute issues   P:   Monitor glucose on BMP  NEUROLOGIC A:   SAH basilar artery at left PCA origin s/p coiling on 4/29 Obstructive HCP 4/30- s/p EVD 4/30 P:   Management per NS EVD per NS Frequent neuro checks  Continue nimodipine for SBP  Continue Cardene for SBP goals Minimize sedation as tolerated  Frequent re-orientation PT/ OT    FAMILY  - Updates: Husband and patient updated at bedside regarding pulmonary status monitoring  - Inter-disciplinary family meet or Palliative Care meeting due by:  04/14/2017   Bevelyn Ngo, AGACNP-BC [facility] Pulmonary/Critical Care Medicine Pager # [phone] or if no answer [phone] 04/09/2017, 11:18 AM  Attending Note:  41 year old female with ICH who presents to PCCM with AMS and increased ICP.  Patient had an IVD placed with subsequent improvement in mental status.  On exam, patient is alert and interactive with decreased lungs bilaterally.  I reviewed CXR myself, low lung volumes with atelectasis.  Discussed with PCCM-NP.  Hemorrhagic CVA:  - Drain to gravity.  - Neuro checks  Hypoxemia:  - Titrate O2 for sat of 88-92%  Atelectasis:  - OOB to chair  - IS  - Flutter valve  - Ambulate as able  Hypokalemia:  - Replace K  - BMET in AM  Patient seen and examined, agree with above note.  I dictated the care and orders written for this patient under my direction.  Alyson Reedy, MD [phone]

## 2017-04-09 NOTE — Progress Notes (Signed)
Pt seen and examined. No issues overnight. Pt c/o HA and neck stiffness.  EXAM: Temp:  [97.9 F (36.6 C)-98.7 F (37.1 C)] 98.7 F (37.1 C) (05/01 0800) Pulse Rate:  [64-107] 66 (05/01 1000) Resp:  [12-29] 13 (05/01 1000) BP: (110-149)/(49-97) 119/62 (05/01 1000) SpO2:  [88 %-99 %] 99 % (05/01 1000) Intake/Output      04/30 0701 - 05/01 0700 05/01 0701 - 05/02 0700   I.V. (mL/kg) 2000 (30.4)    Other 700    Total Intake(mL/kg) 2700 (41)    Urine (mL/kg/hr) 1775 (1.1) 350 (1.5)   Drains 278 (0.2) 34 (0.1)   Total Output 2053 384   Net +647 -384         Awake, alert, oriented Speech fluent MAE well EVD in place, open @ above EAM  LABS: Lab Results  Component Value Date   CREATININE 0.48 04/09/2017   BUN 7 04/09/2017   NA 138 04/09/2017   K 3.4 (L) 04/09/2017   CL 110 04/09/2017   CO2 21 (L) 04/09/2017   Lab Results  Component Value Date   WBC 18.8 (H) 04/09/2017   HGB 9.0 (L) 04/09/2017   HCT 29.4 (L) 04/09/2017   MCV 82.1 04/09/2017   PLT 328 04/09/2017   TCD reviewed, normal velocities  IMPRESSION: - 41 y.o. female SAH d#3 s/p basilar aneurysm coiling and EVD placement. Significantly more awake today after CSF drainage.  PLAN: - Cont supportive care, can mobilize - Will change Tylenol 3 to Norco for HA

## 2017-04-09 NOTE — Progress Notes (Signed)
Dear Doctor:  This patient has been identified as a candidate for PICC for the following reason (s): poor veins/poor circulatory system (CHF, COPD, emphysema, diabetes, steroid use, IV drug abuse, etc.) If you agree, please write an order for the indicated device. For any questions contact the Vascular Access Team at [phone] if no answer, please leave a message.  Thank you for supporting the early vascular access assessment program. 

## 2017-04-09 NOTE — Plan of Care (Signed)
Problem: Fluid Volume: Goal: Ability to maintain a balanced intake and output will improve Outcome: Progressing IVF adjusted

## 2017-04-09 NOTE — Care Management Note (Signed)
Case Management Note  Patient Details  Name: [name] Barnett MRN: [id number] Date of Birth: 1976/01/31  Subjective/Objective:    Pt is a 41 y.o. female admitted to ED on 04/07/17 with occipital headache, nausea/vomitting, and LOC leading to fall striking her head. CT shows aneurysmal SAH from basilar artery at L PCA origin; s/p coil embolization 4/29. 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  PTA, pt independent, lives with spouse.               Action/Plan: PT/OT recommending CIR; will follow as pt progresses.    Expected Discharge Date:                  Expected Discharge Plan:  IP Rehab Facility  In-House Referral:     Discharge planning Services  CM Consult  Post Acute Care Choice:    Choice offered to:     DME Arranged:    DME Agency:     HH Arranged:    HH Agency:     Status of Service:  In process, will continue to follow  If discussed at Long Length of Stay Meetings, dates discussed:    Additional Comments:  Quintella Baton, RN, BSN  Trauma/Neuro ICU Case Manager [phone]

## 2017-04-10 ENCOUNTER — Inpatient Hospital Stay (HOSPITAL_COMMUNITY): Payer: BLUE CROSS/BLUE SHIELD

## 2017-04-10 DIAGNOSIS — I725 Aneurysm of other precerebral arteries: Secondary | ICD-10-CM

## 2017-04-10 DIAGNOSIS — R1310 Dysphagia, unspecified: Secondary | ICD-10-CM

## 2017-04-10 DIAGNOSIS — I10 Essential (primary) hypertension: Secondary | ICD-10-CM

## 2017-04-10 DIAGNOSIS — R0902 Hypoxemia: Secondary | ICD-10-CM

## 2017-04-10 LAB — PHOSPHORUS: PHOSPHORUS: 2.8 mg/dL (ref 2.5–4.6)

## 2017-04-10 LAB — PROCALCITONIN

## 2017-04-10 LAB — BASIC METABOLIC PANEL
ANION GAP: 7 (ref 5–15)
BUN: <5 mg/dL — ABNORMAL LOW (ref 6–20)
CALCIUM: 8.7 mg/dL — AB (ref 8.9–10.3)
CO2: 27 mmol/L (ref 22–32)
Chloride: 106 mmol/L (ref 101–111)
Creatinine, Ser: 0.43 mg/dL — ABNORMAL LOW (ref 0.44–1.00)
GLUCOSE: 109 mg/dL — AB (ref 65–99)
POTASSIUM: 3.3 mmol/L — AB (ref 3.5–5.1)
Sodium: 140 mmol/L (ref 135–145)

## 2017-04-10 LAB — CBC
HEMATOCRIT: 29.4 % — AB (ref 36.0–46.0)
Hemoglobin: 8.7 g/dL — ABNORMAL LOW (ref 12.0–15.0)
MCH: 24.6 pg — ABNORMAL LOW (ref 26.0–34.0)
MCHC: 29.6 g/dL — ABNORMAL LOW (ref 30.0–36.0)
MCV: 83.1 fL (ref 78.0–100.0)
PLATELETS: 318 10*3/uL (ref 150–400)
RBC: 3.54 MIL/uL — AB (ref 3.87–5.11)
RDW: 18.2 % — AB (ref 11.5–15.5)
WBC: 12.7 10*3/uL — AB (ref 4.0–10.5)

## 2017-04-10 LAB — URINE CULTURE: Culture: NO GROWTH

## 2017-04-10 LAB — MAGNESIUM: MAGNESIUM: 2 mg/dL (ref 1.7–2.4)

## 2017-04-10 MED ORDER — SODIUM CHLORIDE 0.9 % IV SOLN
30.0000 meq | Freq: Once | INTRAVENOUS | Status: AC
  Administered 2017-04-10: 30 meq via INTRAVENOUS
  Filled 2017-04-10: qty 15

## 2017-04-10 MED ORDER — DEXAMETHASONE SODIUM PHOSPHATE 4 MG/ML IJ SOLN
4.0000 mg | Freq: Four times a day (QID) | INTRAMUSCULAR | Status: DC
  Administered 2017-04-10 – 2017-04-19 (×37): 4 mg via INTRAVENOUS
  Filled 2017-04-10 (×36): qty 1

## 2017-04-10 NOTE — Progress Notes (Signed)
PULMONARY / CRITICAL CARE MEDICINE   Name: [name] Barnett MRN: [id number] DOB: 1976-10-14    ADMISSION DATE:  04/07/2017 CONSULTATION DATE:  04/08/2017  REFERRING MD:  Dr. Conchita Paris  CHIEF COMPLAINT:  SAH  HISTORY OF PRESENT ILLNESS:  The information is obtained from a thorough medical chart review and from the patient.   41 year old female admitted on 4/29 with acute aneurysmal SAH.   Past medical history as presented below.  On 4/29, she woke up with a mild occipital headache that acutely worsened that morning with blurry vision.  She vomited, loss consciousness, and fell hitting her head.  She presented to the ER, awake as a code stroke with LLE weakness and elevated BP.  NIHSS was 1 for left foot weakness.  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  ASPECTS 10.  She was taken to neuro IR for coil embolization on 4/29 and extubated post procedure in PACU.  Overnight, she became more lethargic.  She developed increased work of breathing and hypoxia with desaturations on room air in 80% that improved to mid 90's on [place] O2.  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  An ventriculostomy drain was placed 4/30 by neurosurgery with improvement in her level of consciousness.  ABG 7.379/ 27.7/ 58 on 4L [place].  PCCM asked to evaluate given concern for airway protection.    SUBJECTIVE: No events per RN.  Patient complains of severe headache and neck pain.   VITAL SIGNS: BP (!) 156/80   Pulse 88   Temp 97.9 F (36.6 C) (Oral)   Resp 16   Ht  (1.626 m)   Wt 145 lb 1 oz (65.8 kg)   SpO2 99%   BMI 24.90 kg/m   HEMODYNAMICS:    INTAKE / OUTPUT: I/O last 3 completed shifts: In: 4006.6 [P.O.:220; I.V.:3520; IV Piggyback:266.6] Out: 6344 [Urine:5900; Drains:444]  PHYSICAL EXAMINATION: General:  Adult female, sitting in bed in NAD HEENT: MM pink/dry, PERRLA, left  orbital ecchymosis, EVD drain to right frontal  Neuro: Awake, AOx3, MAE, no focal deficits CV: s1s2 rrr, no m/r/g PULM: even/non-labored, lungs bilaterally clear NW:GNFA, non-tender, bsx4 active  Extremities: warm/dry, no edema  Skin: no rashes or lesions   LABS:  BMET  Recent Labs Lab 04/07/17 1132 04/07/17 1137 04/09/17 0711 04/10/17 0533  NA 135 137 138 140  K 4.1 4.0 3.4* 3.3*  CL 102 107 110 106  CO2 20*  --  21* 27  BUN <5*  CREATININE 0.70 0.60 0.48 0.43*  GLUCOSE 132* 140* 133* 109*    Electrolytes  Recent Labs Lab 04/07/17 1132 04/09/17 0711 04/10/17 0533  CALCIUM 8.6* 8.5* 8.7*  MG  --   --  2.0  PHOS  --   --  2.8    CBC  Recent Labs Lab 04/07/17 1900 04/09/17 0711 04/10/17 0533  WBC 19.5* 18.8* 12.7*  HGB 10.5* 9.0* 8.7*  HCT 34.0* 29.4* 29.4*  PLT 369 328 318    Coag's  Recent Labs Lab 04/07/17 1132 04/07/17 1900  APTT  --  49*  INR 0.89 0.98    Sepsis Markers  Recent Labs Lab 04/08/17 1400 04/09/17 0711 04/10/17 0533  PROCALCITON 0.16 <0.10 <0.10    ABG  Recent Labs Lab 04/08/17 0933  PHART 7.379  PCO2ART 27.7*  PO2ART 58.0*    Liver  Enzymes No results for input(s): AST, ALT, ALKPHOS, BILITOT, ALBUMIN in the last 168 hours.  Cardiac Enzymes No results for input(s): TROPONINI, PROBNP in the last 168 hours.  Glucose  Recent Labs Lab 04/07/17 1131  GLUCAP 138*    Imaging Dg Chest Port 1 View  Result Date: 04/10/2017 CLINICAL DATA:  Respiratory failure, recent CVA secondary to hemorrhage following ruptured intracranial aneurysm. EXAM: PORTABLE CHEST 1 VIEW COMPARISON:  Portable chest x-ray of Apr 09, 2017 FINDINGS: The lungs are well-expanded. The interstitial prominence noted on yesterday's study has decreased. The heart and pulmonary vascularity are normal. The mediastinum is normal in width. The PICC line tip projects over the midportion of the SVC. IMPRESSION: There is no pneumonia. Improved  appearance of the pulmonary interstitium consistent with resolution of interstitial edema. Electronically Signed   By: David  [place] M.D.   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  CT maxillofacial 4/29 > left periorbital soft tissue swelling/hematoma, neg fx  Head CT 4/30 > no new hemorrhage,  ? SCA territory stroke with some compression of the aqueduct and progressive ventriculomegaly.  PCXR 4/30 > patchy right infrahilar density either ? atelectasis or early infiltrate  PCXR 5/1> Slight interval increase in interstitial density bilaterally may reflect worsening atelectasis or early pneumonia.   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  Increasingly lethargic and hypoxic overnight.  EVD placed 4/30 for obstructive HCP with improvement in mental status  ASSESSMENT / PLAN:  NEUROLOGIC A:   SAH basilar artery at left PCA origin s/p coiling on 4/29 Obstructive HCP 4/30- s/p EVD 4/30 Headache/ neck stiffness P:   Management per NS EVD per NS Continue frequent neuro checks / ICU monitoring Continue nimodipine for vasospasm prophylaxis  Cardene for SBP goals <  Minimize sedation PT/ OT  Norco for HA per NS   PULMONARY A: Acute hypoxic respiratory insufficiency - in the setting of SAH/ obstructive HCP  improved after EVD-  Resolved  RA on 5/1 Atelectasis   Mild aspiration  risk- dysphagia 3 w/thin liquids - per SLP 5/1 Tobacco Abuse  P:   Aggressive pulmonary hygiene w/ IS, flutter valve, mobilize per PT/ OT ( ok per NS)  O2 to keep sats > 92% SLP following  CXR as needed Tobacco abuse cessation when appropriate    CARDIOVASCULAR A:  Hx HTN P:  tele monitoring Cardene as needed for goal SBP < 160 Labetalol prn  Maintain MAP > 65  RENAL A:   Hypokalemia- ongoing P:   NS at 75 ml/hr Replete KCL 30 meq 5/2 Trend Mag  Trend BMP / urinary output Replace electrolytes as indicated Avoid nephrotoxic agents, ensure adequate renal perfusion   GASTROINTESTINAL A:   Dysphagia 3 diet/ thin liquids Hx GERD, duodenal obstruction P:   Ongoing SLP recs Continue Protonix Colace for bowel regimen  HEMATOLOGIC A:  No acute issues  Chronic Normocytic anemia -  P:  Monitor CBC  INFECTIOUS A:   Leukocytosis - ? inflammatory vs infection, favor inflammatory- PCT <0.10 Anemia  Afebrile  P:   [place] fever curve/ WBC Follow cultures Trend CBC  ENDOCRINE A:   No acute issues   P:   Monitor glucose on BMP   FAMILY  - Updates: Husband and patient updated at bedside regarding pulmonary status monitoring 5/2.    - Inter-disciplinary family meet or Palliative Care meeting due by:  04/14/2017  CC time 30 mins  PCCM will sign off and be available as needed.  Please call if needed.   Posey Boyer, AGACNP-BC [place] [facility] Pgr: [phone] or if no answer [phone] 04/10/2017, 9:15 AM   Attending Note:  41 year old female with ICH that PCCM was consulted for AMS.  AMS improved with placement of a drain.  On exam, patient is clearing her airway well and lungs are clear.  I reviewed CXR myself, low lung volumes but otherwise clear.  Discussed with PCCM-NP.  Hypoxemia:  - Titrate O2 for sat of 88-92%  Dysphagia:  - Dysphagia 3 diet  - Monitor closely for airway protection  Hypertension:  - Cardene drip  - Titrate for SBP  of 120  Hypokalemia:  - Replace K   - BMET in AM  Low lung volumes:  - IS per RT protocol  - Ambulate as able  PCCM will sign off, please call back if needed  Patient seen and examined, agree with above note.  I dictated the care and orders written for this patient under my direction.  Alyson Reedy, MD [phone]

## 2017-04-10 NOTE — Plan of Care (Signed)
Problem: Bowel/Gastric: Goal: Will not experience complications related to bowel motility Outcome: Progressing Pt wanted to receive dose of Colace, and has been tolerating thin liquids well.

## 2017-04-10 NOTE — Progress Notes (Signed)
Pt seen and examined. No issues overnight. Cont to c/o HA, neck stiffness  EXAM: Temp:  [97.9 F (36.6 C)-98.6 F (37 C)] 98.6 F (37 C) (05/02 0800) Pulse Rate:  [62-91] 82 (05/02 0900) Resp:  [7-19] 7 (05/02 0900) BP: (107-167)/(45-107) 135/59 (05/02 0900) SpO2:  [85 %-99 %] 96 % (05/02 0900) Intake/Output      05/01 0701 - 05/02 0700 05/02 0701 - 05/03 0700   P.O. 220    I.V. 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 Open at  LABS: Lab Results  Component Value Date   CREATININE 0.43 (L) 04/10/2017   BUN <5 (L) 04/10/2017   NA 140 04/10/2017   K 3.3 (L) 04/10/2017   CL 106 04/10/2017   CO2 27 04/10/2017   Lab Results  Component Value Date   WBC 12.7 (H) 04/10/2017   HGB 8.7 (L) 04/10/2017   HCT 29.4 (L) 04/10/2017   MCV 83.1 04/10/2017   PLT 318 04/10/2017    IMPRESSION: - 41 y.o. female SAH d# 4 s/p coiling basilar aneurysm. Neurologically stable with left 3rd/6th palsies.  PLAN: - Will start decadron today - TCD today - Cont supportive carfe

## 2017-04-10 NOTE — Progress Notes (Signed)
PT Cancellation Note  Patient Details Name: [name] Barnett MRN: [id number] DOB: May 31, 1976   Cancelled Treatment:    Reason Eval/Treat Not Completed: Pain limiting ability to participate;Patient not medically ready Pt with intense neck pain, sleeping in bed in dark room. Per Rn, to start steroids. Will follow up after dose if time allows or tomorrow as appropriate.   [name] 04/10/2017, 9:26 AM Mylo Red, PT, DPT [phone]

## 2017-04-10 NOTE — Progress Notes (Signed)
  Speech Language Pathology Treatment: Dysphagia  Patient Details Name: [name] Barnett MRN: [id number] DOB: 08-Jan-1976 Today's Date: 04/10/2017 Time: 7829-5621 SLP Time Calculation (min) (ACUTE ONLY): 10 min  Assessment / Plan / Recommendation Clinical Impression  Pt consumed solids and thin liquids with swift oral phase and no overt signs of aspiration. She is asking for regular-textured solids. Recommend advancement to regular diet and thin liquids. Will f/u briefly for tolerance.   HPI HPI: 41 year old female admitted on 4/29 with acute aneurysmal SAH s/p coiling. 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 Pt initially passed the stroke swallow screen but was observed to be coughing with thin liquid intake.       SLP Plan  Continue with current plan of care       Recommendations  Diet recommendations: Regular;Thin liquid Liquids provided via: Cup;Straw Medication Administration: Whole meds with puree Supervision: Patient able to self feed;Full supervision/cueing for compensatory strategies Compensations: Slow rate;Small sips/bites Postural Changes and/or Swallow Maneuvers: Seated upright 90 degrees                Oral Care Recommendations: Oral care BID Follow up Recommendations: Inpatient Rehab SLP Visit Diagnosis: Dysphagia, unspecified (R13.10) Plan: Continue with current plan of care       GO                Maxcine Ham 04/10/2017, 4:04 PM  Maxcine Ham, M.A. CCC-SLP [phone]

## 2017-04-10 NOTE — Evaluation (Signed)
Speech Language Pathology Evaluation Patient Details Name: [name] Barnett MRN: [id number] DOB: 14-Jan-1976 Today's Date: 04/10/2017 Time: 4098-1191 SLP Time Calculation (min) (ACUTE ONLY): 10 min  Problem List:  Patient Active Problem List   Diagnosis Date Noted  . Basilar artery aneurysm ([name])   . Hypoxia   . 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 Elevated gastrin level 01/25/2012  . Hypokalemia 01/21/2012  . Nausea & vomiting 01/20/2012  . Epigastric pain 01/20/2012  . Duodenal ulcer, acute with obstruction 10/17/2011  . S/P laparoscopic cholecystectomy 10/16/2011   Past Medical History:  Past Medical History:  Diagnosis Date  . Duodenal obstruction   . GERD (gastroesophageal reflux disease)   . Hypertension    Past Surgical History:  Past Surgical History:  Procedure Laterality Date  . BALLOON DILATION  12/31/2011   Procedure: BALLOON DILATION;  Surgeon: Barrie Folk, MD;  Location: [facility] ENDOSCOPY;  Service: Endoscopy;  Laterality: N/A;  . CHOLECYSTECTOMY  07/28/11  . ESOPHAGOGASTRODUODENOSCOPY  10/17/2011   Procedure: ESOPHAGOGASTRODUODENOSCOPY (EGD);  Surgeon: Barrie Folk, MD;  Location: [facility] ENDOSCOPY;  Service: Endoscopy;  Laterality: N/A;  . RADIOLOGY WITH ANESTHESIA N/A 04/07/2017   Procedure: RADIOLOGY WITH ANESTHESIA;  Surgeon: Lisbeth Renshaw, MD;  Location: [facility] OR;  Service: Radiology;  Laterality: N/A;  . TEMPOROMANDIBULAR JOINT SURGERY     2 surgeries  . VAGOTOMY  01/23/2012   Procedure: VAGOTOMY, antrectomy and BII;  Surgeon: Currie Paris, MD;  Location: [facility] OR;  Service: General;  Laterality: N/A;  Laparotomy with vagotomy.   HPI:  41 year old female admitted on 4/29 with acute aneurysmal SAH s/p coiling. 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 Pt initially passed the stroke swallow screen but was observed to be coughing with thin liquid intake.    Assessment / Plan /  Recommendation Clinical Impression  Pt scored a 17/22 on the MoCA-Blind, which was administered due to pt's blurry vision. She has difficulty in the areas of alternating attention and recall of new information primarily, although with reduced safety awareness as well. She would benefit from SLP f/u to address higher level cognitive skills and safety.    SLP Assessment  SLP Recommendation/Assessment: Patient needs continued Speech Lanaguage Pathology Services SLP Visit Diagnosis: Cognitive communication deficit (R41.841)    Follow Up Recommendations  Inpatient Rehab    Frequency and Duration min 2x/week  2 weeks      SLP Evaluation Cognition  Overall Cognitive Status: Impaired/Different from baseline Arousal/Alertness: Awake/alert Orientation Level: Oriented X4 Attention: Alternating Alternating Attention: Impaired Alternating Attention Impairment: Functional basic;Verbal basic Memory: Impaired Memory Impairment: Decreased recall of new information;Retrieval deficit Awareness: Impaired Awareness Impairment: Anticipatory impairment Problem Solving: Appears intact Safety/Judgment: Impaired       Comprehension  Auditory Comprehension Overall Auditory Comprehension: Appears within functional limits for tasks assessed    Expression Expression Primary Mode of Expression: Verbal Verbal Expression Overall Verbal Expression: Appears within functional limits for tasks assessed   Oral / Motor  Motor Speech Overall Motor Speech: Appears within functional limits for tasks assessed   GO                    Maxcine Ham 04/10/2017, 4:11 PM  Maxcine Ham, M.A. CCC-SLP [phone]

## 2017-04-10 NOTE — Progress Notes (Signed)
Transcranial Doppler  Date POD PCO2 HCT BP  MCA ACA PCA OPHT SIPH VERT Basilar  4/30/ rds     Right  Left   73  75   -67  -57   50  47   52  26   46  77   -58  -40   -52      04/10/17 rds     Right  Left   66  146   -71  -109   41  51   17  25   83  70   -66  -60   -60           Right  Left                                             Right  Left                                             Right  Left                                            Right  Left                                            Right  Left                                        MCA = Middle Cerebral Artery      OPHT = Opthalmic Artery     BASILAR = Basilar Artery   ACA = Anterior Cerebral Artery     SIPH = Carotid Siphon PCA = Posterior Cerebral Artery   VERT = Verterbral Artery                   Normal MCA = 62+\-12 ACA = 50+\-12 PCA = 42+\-23   04/08/17 - Lindegaard Ratio - Right 2.2, Left 2.4 rds 04/10/17 - Lindegaard Ratio - Right 2.0, Left 4.1 rds

## 2017-04-11 LAB — TYPE AND SCREEN
ABO/RH(D): O POS
ANTIBODY SCREEN: NEGATIVE
UNIT DIVISION: 0
UNIT DIVISION: 0
Unit division: 0
Unit division: 0
Unit division: 0
Unit division: 0
Unit division: 0
Unit division: 0

## 2017-04-11 LAB — BPAM RBC
BLOOD PRODUCT EXPIRATION DATE: 201805242359
BLOOD PRODUCT EXPIRATION DATE: 201805242359
BLOOD PRODUCT EXPIRATION DATE: 201805242359
BLOOD PRODUCT EXPIRATION DATE: 201805242359
BLOOD PRODUCT EXPIRATION DATE: 201805242359
Blood Product Expiration Date: 201805242359
Blood Product Expiration Date: 201805242359
Blood Product Expiration Date: 201805242359
ISSUE DATE / TIME: 201804302238
ISSUE DATE / TIME: 201805011245
ISSUE DATE / TIME: 201805011245
ISSUE DATE / TIME: 201805020930
ISSUE DATE / TIME: 201805022332
UNIT TYPE AND RH: 5100
UNIT TYPE AND RH: 5100
UNIT TYPE AND RH: 5100
UNIT TYPE AND RH: 5100
UNIT TYPE AND RH: 5100
Unit Type and Rh: 5100
Unit Type and Rh: 5100
Unit Type and Rh: 5100

## 2017-04-11 NOTE — Progress Notes (Signed)
  Speech Language Pathology Treatment: Dysphagia  Patient Details Name: [name] Barnett MRN: [id number] DOB: 11/19/1976 Today's Date: 04/11/2017 Time: [id number] SLP Time Calculation (min) (ACUTE ONLY): 14 min  Assessment / Plan / Recommendation Clinical Impression  RN called, asking for SLP to see pt. - husband concerned that pt is not tolerating thin liquids, coughing/clearing throat constantly throughout meal.  Pt denies trouble, but when provided three oz water, she self-regulated intake, stopped midway with subsequent coughing.  She consumed large volumes of nectar thick liquid with no cough/throat clear.  For now, recommend resuming nectar-thick liquids.  Demonstrated to pt how to prepare liquids to correct viscosity - she was amenable to modifying diet, particularly after explaining that her dysphagia is mild and only temporary.  D/W RN.   HPI HPI: 41 year old female admitted on 4/29 with acute aneurysmal SAH s/p coiling. 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 Pt initially passed the stroke swallow screen but was observed to be coughing with thin liquid intake.       SLP Plan  Continue with current plan of care       Recommendations  Diet recommendations: Nectar-thick liquid;Regular Liquids provided via: Cup;Straw Medication Administration: Whole meds with puree Supervision: Patient able to self feed;Intermittent supervision to cue for compensatory strategies Compensations: Slow rate;Small sips/bites Postural Changes and/or Swallow Maneuvers: Seated upright 90 degrees                Oral Care Recommendations: Oral care BID Plan: Continue with current plan of care       GO                Blenda MountsCouture, Fransisco Messmer Laurice 04/11/2017, 2:15 PM

## 2017-04-11 NOTE — Progress Notes (Signed)
Physical Therapy Treatment Patient Details Name: [name] Barnett MRN: [id number] DOB: 08-23-1976 Today's Date: 04/11/2017    History of Present Illness Pt is a 41 y.o. female admitted to ED on 04/07/17 with occipital headache, nausea/vomitting, and LOC leading to fall striking her head. 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 S/p coil embolization 4/29. 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 Pertinent PMH includes HTN, GERD.    PT Comments    Pt progressing with mobility. Able to amb 75'x2 in hallway with 1-2 person HHA and min-maxA for balance; no c/o of headache while walking until pt returns to seated position. Pt demonstrates good awareness of deficits, but requires multiple cues for gait initiation, posture, and balance. Continues to have difficulty seeing from L eye and blurred vision in R eye, as well as L gaze palsy and nystagmus. Pt motivated to participate with PT. Husband present throughout session and remains supportive. Will continue to follow acutely.   Follow Up Recommendations  CIR;Supervision for mobility/OOB     Equipment Recommendations  Other (comment) (TBD next venue)    Recommendations for Other Services       Precautions / Restrictions Precautions Precautions: Fall Precaution Comments: EVD Restrictions Weight Bearing Restrictions: No    Mobility  Bed Mobility Overal bed mobility: Needs Assistance Bed Mobility: Supine to Sit     Supine to sit: HOB elevated;Min guard     General bed mobility comments: Increased time and min guard for bed mobility; no physical assist required except for HHA for balance once sitting secondary to dizziness  Transfers Overall transfer level: Needs assistance Equipment used: None Transfers: Sit to/from Stand Sit to Stand: Min guard         General transfer comment: Stood x2 with min guard for balance, no physical assist required.  Required cues for safety and hand placement  Ambulation/Gait Ambulation/Gait assistance: +2 physical assistance;Mod assist Ambulation Distance (Feet): 75 Feet (+75) Assistive device: 1 person hand held assist;2 person hand held assist Gait Pattern/deviations: Step-through pattern;Decreased stride length;Decreased weight shift to left;Staggering right;Narrow base of support Gait velocity: Decreased Gait velocity interpretation: Below normal speed for age/gender General Gait Details: Pt amb in hallway 75' x2 (1x seated rest break) with min-maxA and 1-2 person HHA for balance; required +2 assist for balance intermittently, in addition to chair follow and assist for lines/leads. Pt aware of increased R lateral lean, requiring multiple cues to correct. Shuffling gait requiring increased cues to take purposeful steps, especially with LLE. Significant decreased balance, pt able to self-correct <75% of time, requiring min-maxA to prevent LOB.   Stairs            Wheelchair Mobility    Modified Rankin (Stroke Patients Only) Modified Rankin (Stroke Patients Only) Pre-Morbid Rankin Score: No symptoms Modified Rankin: Moderately severe disability     Balance Overall balance assessment: Needs assistance Sitting-balance support: Bilateral upper extremity supported;Feet supported Sitting balance-Leahy Scale: Fair Sitting balance - Comments: Pt moving quickly and slighlty impulsive due to dizziness.    Standing balance support: Bilateral upper extremity supported;Single extremity supported;During functional activity Standing balance-Leahy Scale: Poor Standing balance comment: Reliant on UE support and/or at least minA for standing balance                            Cognition Arousal/Alertness: Awake/alert Behavior During Therapy: WFL for tasks assessed/performed Overall Cognitive  Status: Impaired/Different from baseline Area of Impairment: Problem solving                              Problem Solving: Requires verbal cues;Requires tactile cues General Comments: Pt aware of deficits, requiring intermittent verbal/tactile cues for mobility and positioning. C/o blurred vision in R eye with difficulty seeing out of L eye; L eye palsy and nystagmus noted with horizontal eye movements.      Exercises      General Comments        Pertinent Vitals/Pain Pain Score: 8  Pain Location: neck and head Pain Descriptors / Indicators: Grimacing;Guarding Pain Intervention(s): Limited activity within patient's tolerance;Monitored during session;Premedicated before session    Home Living                      Prior Function            PT Goals (current goals can now be found in the care plan section) Acute Rehab PT Goals Patient Stated Goal: Return home PT Goal Formulation: With patient Time For Goal Achievement: 04/23/17 Potential to Achieve Goals: Good Progress towards PT goals: Progressing toward goals    Frequency    Min 4X/week      PT Plan Current plan remains appropriate    Co-evaluation              AM-PAC PT "6 Clicks" Daily Activity  Outcome Measure  Difficulty turning over in bed (including adjusting bedclothes, sheets and blankets)?: None Difficulty moving from lying on back to sitting on the side of the bed? : None Difficulty sitting down on and standing up from a chair with arms (e.g., wheelchair, bedside commode, etc,.)?: Total Help needed moving to and from a bed to chair (including a wheelchair)?: Total Help needed walking in hospital room?: A Lot Help needed climbing 3-5 steps with a railing? : Total 6 Click Score: 13    End of Session Equipment Utilized During Treatment: Gait belt Activity Tolerance: Patient tolerated treatment well Patient left: in chair;with call bell/phone within reach;with family/visitor present;with nursing/sitter in room Nurse Communication: Mobility status PT Visit Diagnosis:  Pain;Other abnormalities of gait and mobility (R26.89) Pain - part of body:  (Neck/head)     Time: [id number] PT Time Calculation (min) (ACUTE ONLY): 32 min  Charges:  $Gait Training: 8-22 mins $Neuromuscular Re-education: 8-22 mins                    G Codes:      [name] Barnett, SPT Office-[phone]  [name] Barnett 04/11/2017, 1:52 PM

## 2017-04-11 NOTE — Plan of Care (Signed)
Problem: Activity: Goal: Risk for activity intolerance will decrease Outcome: Progressing Pt walked around the unit a couple of times today and tolerated it well.

## 2017-04-11 NOTE — Progress Notes (Signed)
Pt seen and examined. No issues overnight. Pt has some improvement in HA/neck pain with steroids.  EXAM: Temp:  [98 F (36.7 C)-98.3 F (36.8 C)] 98.1 F (36.7 C) (05/03 0400) Pulse Rate:  [55-92] 55 (05/03 0800) Resp:  [7-23] 14 (05/03 0800) BP: (135-171)/(59-100) 150/82 (05/03 0800) SpO2:  [95 %-100 %] 98 % (05/03 0800) Intake/Output      05/02 0701 - 05/03 0700 05/03 0701 - 05/04 0700   P.O. 240    I.V. (mL/kg) 1800 (27.4) 75 (1.1)   IV Piggyback 265    Total Intake(mL/kg) 2305 (35) 75 (1.1)   Urine (mL/kg/hr) 4975 (3.2)    Drains 244 (0.2) 14 (0.1)   Total Output 5219 14   Net -2914 +61         Awake, alert, oriented Speech fluent Left VI/III palsy Good strength  LABS: Lab Results  Component Value Date   CREATININE 0.43 (L) 04/10/2017   BUN <5 (L) 04/10/2017   NA 140 04/10/2017   K 3.3 (L) 04/10/2017   CL 106 04/10/2017   CO2 27 04/10/2017   Lab Results  Component Value Date   WBC 12.7 (H) 04/10/2017   HGB 8.7 (L) 04/10/2017   HCT 29.4 (L) 04/10/2017   MCV 83.1 04/10/2017   PLT 318 04/10/2017    IMAGING: TCD, marginal increase in LMCA/LACA.  IMPRESSION: - 41 y.o. female SAH d# 5, neurologically stable  PLAN: - Cont supportive care - OOB today

## 2017-04-12 ENCOUNTER — Inpatient Hospital Stay (HOSPITAL_COMMUNITY): Payer: BLUE CROSS/BLUE SHIELD

## 2017-04-12 ENCOUNTER — Encounter (HOSPITAL_COMMUNITY): Payer: Self-pay | Admitting: Anesthesiology

## 2017-04-12 DIAGNOSIS — I609 Nontraumatic subarachnoid hemorrhage, unspecified: Secondary | ICD-10-CM

## 2017-04-12 MED ORDER — CHLORHEXIDINE GLUCONATE CLOTH 2 % EX PADS
6.0000 | MEDICATED_PAD | Freq: Every day | CUTANEOUS | Status: DC
  Administered 2017-04-12 – 2017-04-19 (×8): 6 via TOPICAL

## 2017-04-12 NOTE — Progress Notes (Signed)
Pt seen and examined. No issues overnight. Has some HA, somewhat improved.  EXAM: Temp:  [97.8 F (36.6 C)-98.3 F (36.8 C)] 98.3 F (36.8 C) (05/04 1200) Pulse Rate:  [56-87] 70 (05/04 1700) Resp:  [11-21] 17 (05/04 1700) BP: (133-181)/(65-104) 157/75 (05/04 1700) SpO2:  [96 %-100 %] 99 % (05/04 1700) Intake/Output      05/04 0701 - 05/05 0700   P.O.    I.V. (mL/kg) 750 (11.4)   Total Intake(mL/kg) 750 (11.4)   Urine (mL/kg/hr) 800 (1)   Drains 73 (0.1)   Total Output 873   Net -123        Awake, alert, oriented Left III/VI palsy MAE well, minimal left weakness EVD in place, clear csf. Open @ 5mmHg  LABS: Lab Results  Component Value Date   CREATININE 0.43 (L) 04/10/2017   BUN <5 (L) 04/10/2017   NA 140 04/10/2017   K 3.3 (L) 04/10/2017   CL 106 04/10/2017   CO2 27 04/10/2017   Lab Results  Component Value Date   WBC 12.7 (H) 04/10/2017   HGB 8.7 (L) 04/10/2017   HCT 29.4 (L) 04/10/2017   MCV 83.1 04/10/2017   PLT 318 04/10/2017    IMPRESSION: - 41 y.o. female SAH d#6 s/p basilar aneurysm coiling, neurologically stable  PLAN: - Cont supportive care - Will cont EVD at 5, may consider raising next week.

## 2017-04-12 NOTE — Progress Notes (Signed)
Occupational Therapy Treatment Patient Details Name: [name] Barnett MRN: [id number] DOB: 08-09-1976 Today's Date: 04/12/2017    History of present illness Pt is a 41 y.o. female admitted to ED on 04/07/17 with occipital headache, nausea/vomitting, and LOC leading to fall striking her head. 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 S/p coil embolization 4/29. 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 Pertinent PMH includes HTN, GERD.    OT comments  Seen as cotreat with PT. Pt appears to demonstrate increased difficulties with coordination of movement, balance and is complaining of "black areas" in her visual field. Apparent dysconjugate gaze. Pt impulsive at times. Attempted to ambulate with RW, however, able to facilitate movement patterns and weight shifts with +2 HHA. Continue to recommend DC to CIR for rehab when medically stable. Very motivated to return to PLOF.  Recommend beginning gaze stabilization activities with pt.  Follow Up Recommendations  CIR;Supervision/Assistance - 24 hour    Equipment Recommendations  Other (comment) (will further assess)    Recommendations for Other Services Rehab consult    Precautions / Restrictions Precautions Precautions: Fall Precaution Comments: EVD       Mobility Bed Mobility Overal bed mobility: Needs Assistance Bed Mobility: Supine to Sit     Supine to sit: Min assist;HOB elevated Sit to supine: Supervision   General bed mobility comments: appers impulsive at times  Transfers Overall transfer level: Needs assistance Equipment used: 2 person hand held assist Transfers: Sit to/from Stand Sit to Stand: Min assist;+2 physical assistance         General transfer comment: pt impulsively jumping up  to stand, legs thrust back against the bed frame, needing stability assist  +2 safety    Balance Overall balance assessment: Needs  assistance Sitting-balance support: No upper extremity supported;Single extremity supported Sitting balance-Leahy Scale: Fair Sitting balance - Comments: tends to list Left and posteriorly if not cued to midline,.  Head held laterally unless cued.   Standing balance support: Single extremity supported Standing balance-Leahy Scale: Poor Standing balance comment: Reliant on UE support and/or at least minA for standing balance.  Tendency to list Left of midline with upper trunk and head scewed left to compensate.                           ADL either performed or assessed with clinical judgement   ADL Overall ADL's : Needs assistance/impaired                     Lower Body Dressing: Moderate assistance Lower Body Dressing Details (indicate cue type and reason): able to doff socks at bed level             Functional mobility during ADLs: Moderate assistance;+2 for physical assistance;Cueing for safety;Cueing for sequencing;Rolling walker General ADL Comments: Pt requiring increased assistnace with mobility. Dizziness appears to have improved since evaluation. Visual deficits are significantly impacting function.     Vision   Vision Assessment?: Yes Eye Alignment: Impaired (comment) Ocular Range of Motion: Restricted on the right Alignment/Gaze Preference: Head turned (R/tilted) Tracking/Visual Pursuits: Left eye does not track medially;Decreased smoothness of vertical tracking;Decreased smoothness of horizontal tracking;Impaired - to be further tested in functional context Saccades: Additional head turns occurred during testing Visual Fields: Impaired-to be further tested in functional context Additional Comments: pt complaining of "black areas in her field. Will further assess  Perception     Praxis      Cognition Arousal/Alertness: Awake/alert Behavior During Therapy: WFL for tasks assessed/performed Overall Cognitive Status: Impaired/Different from  baseline (some impulsivities) Area of Impairment: Safety/judgement;Attention                   Current Attention Level: Selective     Safety/Judgement: Decreased awareness of safety     General Comments: will further assess; appears impulsive at times; husband states she is not at basleine        Exercises     Shoulder Instructions       General Comments Vision not conjugate.  left not tracking adequately, appears to have a superior medial field cut--See OT note for further details.  Vitals generally stable throughout.    Pertinent Vitals/ Pain       Pain Assessment: 0-10 Pain Score: 7  Pain Location: head Pain Descriptors / Indicators: Grimacing;Guarding;Aching Pain Intervention(s): Limited activity within patient's tolerance  Home Living                                          Prior Functioning/Environment              Frequency  Min 2X/week        Progress Toward Goals  OT Goals(current goals can now be found in the care plan section)  Progress towards OT goals: Progressing toward goals  Acute Rehab OT Goals Patient Stated Goal: Return home OT Goal Formulation: With patient/family Time For Goal Achievement: 04/23/17 Potential to Achieve Goals: Good ADL Goals Pt Will Perform Grooming: with supervision;sitting Pt Will Perform Upper Body Dressing: with supervision;sitting Pt Will Perform Lower Body Dressing: with min assist;sit to/from stand Pt Will Transfer to Toilet: with supervision;ambulating;regular height toilet Pt Will Perform Toileting - Clothing Manipulation and hygiene: with supervision;sit to/from stand Pt/caregiver will Perform Home Exercise Program: Both right and left upper extremity;Increased strength;With written HEP provided Additional ADL Goal #1: Pt will demonstrate improved functional use of vision by incorporating compensatory strategies for double vision with L gaze during ADL.  Plan Discharge plan remains  appropriate    Co-evaluation    PT/OT/SLP Co-Evaluation/Treatment: Yes Reason for Co-Treatment: Complexity of the patient's impairments (multi-system involvement);For patient/therapist safety;To address functional/ADL transfers PT goals addressed during session: Mobility/safety with mobility OT goals addressed during session: ADL's and self-care      AM-PAC PT "6 Clicks" Daily Activity     Outcome Measure   Help from another person eating meals?: A Little Help from another person taking care of personal grooming?: A Lot Help from another person toileting, which includes using toliet, bedpan, or urinal?: A Lot Help from another person bathing (including washing, rinsing, drying)?: A Lot Help from another person to put on and taking off regular upper body clothing?: A Little Help from another person to put on and taking off regular lower body clothing?: A Lot 6 Click Score: 14    End of Session Equipment Utilized During Treatment: Gait belt;Rolling walker  OT Visit Diagnosis: Unsteadiness on feet (R26.81);Low vision, both eyes (H54.2);Muscle weakness (generalized) (M62.81)   Activity Tolerance Patient tolerated treatment well   Patient Left in bed;with call bell/phone within reach;with family/visitor present   Nurse Communication Mobility status;Other (comment) (recommend eye patch to improve functional vision with mobili)        Time: 1529-1600 OT Time Calculation (min): 31  min  Charges: OT General Charges $OT Visit: 1 Procedure OT Treatments $Neuromuscular Re-education: 8-22 mins  [facility], OT/L  [phone] 04/12/2017   Kylan Liberati,HILLARY 04/12/2017, 5:27 PM

## 2017-04-12 NOTE — Progress Notes (Signed)
Physical Therapy Treatment Patient Details Name: [name] Barnett MRN: [id number] DOB: 04/14/1976 Today's Date: 04/12/2017    History of Present Illness Pt is a 41 y.o. female admitted to ED on 04/07/17 with occipital headache, nausea/vomitting, and LOC leading to fall striking her head. 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 S/p coil embolization 4/29. 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 Pertinent PMH includes HTN, GERD.     PT Comments    Pt's general incoordination of movement with back mobility and noticeable ataxia and incoordination with gait more pronounced than originally thought looking at pt on paper.  That in addition to a skewed position sense and visual disturbance made working on normalizing gait a 2 person assist activity.    Follow Up Recommendations  CIR;Supervision for mobility/OOB     Equipment Recommendations  Other (comment) (TBA prior to d/c to home)    Recommendations for Other Services       Precautions / Restrictions Precautions Precautions: Fall Precaution Comments: EVD    Mobility  Bed Mobility Overal bed mobility: Needs Assistance Bed Mobility: Supine to Sit     Supine to sit: Min assist;HOB elevated Sit to supine: Supervision   General bed mobility comments: pt moving to EOB before some of the lines cleared.  Needed guarding due to mild truncal ataxia coupled with visual dysfunction decreasing safety at EOB  Transfers Overall transfer level: Needs assistance Equipment used: 1 person hand held assist;None Transfers: Sit to/from Stand Sit to Stand: Min assist;+2 safety/equipment         General transfer comment: pt impulsively jumping up  to stand, legs thrust back against the bed frame, needing stability assist  +2 safety  Ambulation/Gait Ambulation/Gait assistance: Mod assist;+2 physical assistance Ambulation Distance (Feet): 120 Feet (total  with frequent stops to regroup) Assistive device: 2 person hand held assist (and with RW) Gait Pattern/deviations: Step-through pattern;Decreased step length - right;Decreased stance time - left;Shuffle;Scissoring;Ataxic;Narrow base of support Gait velocity: Decreased Gait velocity interpretation: Below normal speed for age/gender General Gait Details: 2 person facilitation of more normalized gait pattern.  Gait characterized by bil forefoot contact, gross overstepping of left and less advancement of hte R LE.  Extra assist needed to w/shift R with corresponding assist to keep upper trunk in midline to the Left.  Head held laterally from midline about 20+*.  RW made pt visually more aligned, but not taining w/shifting or letting pt feel midline.  Gait degraded with fatigue.   Stairs            Wheelchair Mobility    Modified Rankin (Stroke Patients Only) Modified Rankin (Stroke Patients Only) Pre-Morbid Rankin Score: No symptoms Modified Rankin: Moderately severe disability     Balance Overall balance assessment: Needs assistance Sitting-balance support: No upper extremity supported;Single extremity supported Sitting balance-Leahy Scale: Fair Sitting balance - Comments: tends to list Left and posteriorly if not cued to midline,.  Head held laterally unless cued.   Standing balance support: Single extremity supported Standing balance-Leahy Scale: Poor Standing balance comment: Reliant on UE support and/or at least minA for standing balance.  Tendency to list Left of midline with upper trunk and head scewed left to compensate.                            Cognition Arousal/Alertness: Awake/alert Behavior During Therapy: WFL for tasks assessed/performed Overall Cognitive  Status: Within Functional Limits for tasks assessed (some impulsivities) Area of Impairment: Safety/judgement                                      Exercises      General Comments  General comments (skin integrity, edema, etc.): Vision not conjugate.  left not tracking adequately, appears to have a superior medial field cut--See OT note for further details.  Vitals generally stable throughout.      Pertinent Vitals/Pain Pain Assessment: 0-10 Pain Score: 7  (up walking pain almost gone.) Pain Location: head Pain Descriptors / Indicators: Grimacing;Guarding;Aching Pain Intervention(s): Monitored during session;Premedicated before session    Home Living                      Prior Function            PT Goals (current goals can now be found in the care plan section) Acute Rehab PT Goals Patient Stated Goal: Return home PT Goal Formulation: With patient Time For Goal Achievement: 04/23/17 Potential to Achieve Goals: Good Progress towards PT goals: Progressing toward goals    Frequency    Min 4X/week      PT Plan Current plan remains appropriate    Co-evaluation PT/OT/SLP Co-Evaluation/Treatment: Yes Reason for Co-Treatment: Complexity of the patient's impairments (multi-system involvement);For patient/therapist safety PT goals addressed during session: Mobility/safety with mobility        AM-PAC PT "6 Clicks" Daily Activity  Outcome Measure  Difficulty turning over in bed (including adjusting bedclothes, sheets and blankets)?: None Difficulty moving from lying on back to sitting on the side of the bed? : A Little Difficulty sitting down on and standing up from a chair with arms (e.g., wheelchair, bedside commode, etc,.)?: A Little Help needed moving to and from a bed to chair (including a wheelchair)?: A Lot Help needed walking in hospital room?: A Lot Help needed climbing 3-5 steps with a railing? : A Lot 6 Click Score: 16    End of Session   Activity Tolerance: Patient tolerated treatment well Patient left: in bed;with call bell/phone within reach;with bed alarm set;with family/visitor present Nurse Communication: Mobility  status PT Visit Diagnosis: Other abnormalities of gait and mobility (R26.89);Ataxic gait (R26.0)     Time: 1529-1600 PT Time Calculation (min) (ACUTE ONLY): 31 min  Charges:  $Gait Training: 8-22 mins                    G Codes:       04/30/17  Gould Bing, PT [phone]  (pager)   Eliseo Gum Axten Pascucci Apr 30, 2017, 4:52 PM

## 2017-04-13 LAB — CULTURE, BLOOD (ROUTINE X 2)
CULTURE: NO GROWTH
Culture: NO GROWTH
SPECIAL REQUESTS: ADEQUATE
Special Requests: ADEQUATE

## 2017-04-13 MED ORDER — HYDROCODONE-ACETAMINOPHEN 5-325 MG PO TABS
2.0000 | ORAL_TABLET | Freq: Four times a day (QID) | ORAL | Status: DC | PRN
  Administered 2017-04-13 – 2017-04-15 (×7): 2 via ORAL
  Filled 2017-04-13 (×6): qty 2

## 2017-04-13 NOTE — Progress Notes (Signed)
Patient ID: [name] Barnett, female   DOB: 05/28/1976, 41 y.o.   MRN: [id number] Subjective:  The patient is alert and pleasant. She complains of a headache.  Objective: Vital signs in last 24 hours: Temp:  [98.1 F (36.7 C)-98.7 F (37.1 C)] 98.1 F (36.7 C) (05/05 0000) Pulse Rate:  [57-76] 59 (05/05 0700) Resp:  [11-32] 14 (05/05 0700) BP: (141-182)/(64-104) 156/87 (05/05 0700) SpO2:  [97 %-100 %] 98 % (05/05 0700)  Intake/Output from previous day: 05/04 0701 - 05/05 0700 In: 2040 [P.O.:240; I.V.:1800] Out: 3144 [Urine:2925; Drains:219] Intake/Output this shift: No intake/output data recorded.  Physical exam the patient is alert and oriented 3, Glasgow Coma Scale 15. She is moving all 4 extremities well. She has a left cranial nerve VI palsy.  The patient's ventriculostomy is patent and draining.  Lab Results: No results for input(s): WBC, HGB, HCT, PLT in the last 72 hours. BMET No results for input(s): NA, K, CL, CO2, GLUCOSE, BUN, CREATININE, CALCIUM in the last 72 hours.  Studies/Results: No results found.  Assessment/Plan: Subarachnoid hemorrhage: The patient is progressing well status post coiling of her aneurysm. There are no clinical signs of vasospasm.  LOS: 6 days     Jorje Vanatta D 04/13/2017, 8:25 AM

## 2017-04-14 NOTE — Progress Notes (Signed)
Patient ID: [name] Barnett, female   DOB: 09/10/1976, 41 y.o.   MRN: [id number] Subjective:  The patient is alert and pleasant. She still has a headache. Her husband is at the bedside.  Objective: Vital signs in last 24 hours: Temp:  [97.6 F (36.4 C)-98.3 F (36.8 C)] 98 F (36.7 C) (05/06 0745) Pulse Rate:  [58-81] 64 (05/06 0800) Resp:  [11-19] 14 (05/06 0800) BP: (127-184)/(61-93) 164/87 (05/06 0800) SpO2:  [96 %-100 %] 97 % (05/06 0800)  Intake/Output from previous day: 05/05 0701 - 05/06 0700 In: 1800 [I.V.:1800] Out: 2451 [Urine:2225; Drains:226] Intake/Output this shift: Total I/O In: 75 [I.V.:75] Out: 8 [Drains:8]  Physical exam the patient is alert and oriented 3. She is moving all 4 extremities well.  The patient's ventriculostomy is patent and draining.  Lab Results: No results for input(s): WBC, HGB, HCT, PLT in the last 72 hours. BMET No results for input(s): NA, K, CL, CO2, GLUCOSE, BUN, CREATININE, CALCIUM in the last 72 hours.  Studies/Results: No results found.  Assessment/Plan: Status post subarachnoid hemorrhage day #7: The patient is doing well clinically. We will continue her ventriculostomy.  LOS: 7 days     Starlyn Droge D 04/14/2017, 9:23 AM

## 2017-04-15 ENCOUNTER — Inpatient Hospital Stay (HOSPITAL_COMMUNITY): Payer: BLUE CROSS/BLUE SHIELD

## 2017-04-15 DIAGNOSIS — I609 Nontraumatic subarachnoid hemorrhage, unspecified: Secondary | ICD-10-CM

## 2017-04-15 MED ORDER — BUTALBITAL-APAP-CAFFEINE 50-325-40 MG PO TABS
1.0000 | ORAL_TABLET | Freq: Four times a day (QID) | ORAL | Status: DC | PRN
  Administered 2017-04-15 – 2017-04-18 (×9): 2 via ORAL
  Administered 2017-04-18 (×2): 1 via ORAL
  Administered 2017-04-18 – 2017-04-19 (×2): 2 via ORAL
  Administered 2017-04-19 (×2): 1 via ORAL
  Filled 2017-04-15: qty 2
  Filled 2017-04-15 (×2): qty 1
  Filled 2017-04-15 (×2): qty 2
  Filled 2017-04-15: qty 1
  Filled 2017-04-15 (×2): qty 2
  Filled 2017-04-15 (×3): qty 1
  Filled 2017-04-15 (×4): qty 2
  Filled 2017-04-15: qty 1
  Filled 2017-04-15: qty 2

## 2017-04-15 NOTE — Progress Notes (Signed)
Transcranial Doppler  Date POD PCO2 HCT BP  MCA ACA PCA OPHT SIPH VERT Basilar  4/30/ rds     Right  Left   73  75   -67  -57   50  47   52  26   46  77   -58  -40   -52      04/10/17 rds     Right  Left   66  146   -71  -109   41  51   17  25   83  70   -66  -60   -60      5/4 JE     Right  Left   83  90   -76  -66   43  44   33  43   89  101   -43  -51   -60            Right  Left                                             Right  Left                                            Right  Left                                            Right  Left                                        MCA = Middle Cerebral Artery      OPHT = Opthalmic Artery     BASILAR = Basilar Artery   ACA = Anterior Cerebral Artery     SIPH = Carotid Siphon PCA = Posterior Cerebral Artery   VERT = Verterbral Artery                   Normal MCA = 62+\-12 ACA = 50+\-12 PCA = 42+\-23   04/08/17 - Lindegaard Ratio - Right 2.2, Left 2.4 rds 04/10/17 - Lindegaard Ratio - Right 2.0, Left 4.1 rds 5/4 Lindegaard ratio- right 1.6. Left 2.0.

## 2017-04-15 NOTE — Progress Notes (Signed)
Pt seen and examined. No issues overnight. No complaints.  EXAM: Temp:  [98 F (36.7 C)-98.7 F (37.1 C)] 98 F (36.7 C) (05/07 0400) Pulse Rate:  [59-104] 64 (05/07 0800) Resp:  [11-23] 12 (05/07 0800) BP: (143-186)/(71-102) 146/72 (05/07 0800) SpO2:  [96 %-100 %] 97 % (05/07 0800) Intake/Output      05/06 0701 - 05/07 0700 05/07 0701 - 05/08 0700   I.V. (mL/kg) 1820 (27.7) 75 (1.1)   Total Intake(mL/kg) 1820 (27.7) 75 (1.1)   Urine (mL/kg/hr) 2825 (1.8)    Drains 169 (0.1) 10 (0.1)   Total Output 2994 10   Net -1174 +65         Awake, alert, oriented Speech fluent Left CN III/VI palsy Good strength EVD in place, clear CSF  LABS: Lab Results  Component Value Date   CREATININE 0.43 (L) 04/10/2017   BUN <5 (L) 04/10/2017   NA 140 04/10/2017   K 3.3 (L) 04/10/2017   CL 106 04/10/2017   CO2 27 04/10/2017   Lab Results  Component Value Date   WBC 12.7 (H) 04/10/2017   HGB 8.7 (L) 04/10/2017   HCT 29.4 (L) 04/10/2017   MCV 83.1 04/10/2017   PLT 318 04/10/2017    IMPRESSION: - 41 y.o. female SAH d#9 s/p basilar coiling, neurologically stable  PLAN: - Cont efforts to mobilize as tolerated - Raise EVD to 15mmHg today - Cont supportive care, nimotop, TCD

## 2017-04-15 NOTE — Progress Notes (Signed)
  Speech Language Pathology Treatment: Dysphagia  Patient Details Name: [name] Barnett MRN: [id number] DOB: 07/02/1976 Today's Date: 04/15/2017 Time: [id number] SLP Time Calculation (min) (ACUTE ONLY): 19 min  Assessment / Plan / Recommendation Clinical Impression  Pt continues to have coughing and throat clearing with thin liquids, particularly when she is drinking following solid boluses. She reports that she had some coughing with thin liquid intake PTA. Recommend to proceed with FEES to better assess oropharyngeal swallow - contacted MD's office requesting order. Until FEES can be completed would continue with regular diet and nectar thick liquids, but pt can have sips of thin water in between meals.   HPI HPI: 41 year old female admitted on 4/29 with acute aneurysmal SAH s/p coiling. 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 Pt initially passed the stroke swallow screen but was observed to be coughing with thin liquid intake.       SLP Plan  Other (Comment) (FEES)       Recommendations  Diet recommendations: Regular;Nectar-thick liquid;Other(comment) (sips of water in between meals) Liquids provided via: Cup;Straw Medication Administration: Whole meds with puree Supervision: Patient able to self feed;Intermittent supervision to cue for compensatory strategies Compensations: Slow rate;Small sips/bites Postural Changes and/or Swallow Maneuvers: Seated upright 90 degrees                Oral Care Recommendations: Oral care BID Follow up Recommendations: Inpatient Rehab SLP Visit Diagnosis: Dysphagia, unspecified (R13.10) Plan: Other (Comment) (FEES)       GO                Maxcine Hamaiewonsky, Xochitl Egle 04/15/2017, 12:15 PM  Maxcine HamLaura Paiewonsky, M.A. CCC-SLP [phone]

## 2017-04-15 NOTE — Plan of Care (Signed)
Problem: Education: Goal: Knowledge of patient specific risk factors addressed and post discharge goals established will improve Outcome: Progressing Discussed with patient the importance of taking all prescribed medications accurately and in a timely manner. Patient was able to give verbal feedback and states she understands the importance. Husband was also present and verbalized understanding.

## 2017-04-15 NOTE — Progress Notes (Addendum)
Occupational Therapy Treatment Patient Details Name: [name] Barnett MRN: [id number] DOB: 06-18-1976 Today's Date: 04/15/2017    History of present illness Pt is a 41 y.o. female admitted to ED on 04/07/17 with occipital headache, nausea/vomitting, and LOC leading to fall striking her head. 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 S/p coil embolization 4/29. 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 Pertinent PMH includes HTN, GERD.    OT comments  Pt progressing well toward OT goals. She was able to don socks with supervision sitting up in bed this session. Visual deficits, decreased depth perception, decreased spatial awareness are significantly limiting her independence with ADL. Pt with difficulty texting on her phone but is able to read large text when on a vertical surface at midline. She was able to track with her L eye to the L and stabilize gaze to the L and at midline during ADL tasks but remains limited to the R. Additionally, during seated ADL, pt demonstrating poor L UE coordination. D/C plan remains appropriate. OT will continue to follow acutely.   Follow Up Recommendations  CIR;Supervision/Assistance - 24 hour    Equipment Recommendations  Other (comment) (TBD at next venue of care)    Recommendations for Other Services Rehab consult    Precautions / Restrictions Precautions Precautions: Fall Precaution Comments: EVD Restrictions Weight Bearing Restrictions: No       Mobility Bed Mobility Overal bed mobility: Needs Assistance Bed Mobility: Supine to Sit     Supine to sit: Min assist;HOB elevated     General bed mobility comments: pt impulsive with movement- truncal ataxia present getting to EOB, assist for steadying.   Transfers Overall transfer level: Needs assistance Equipment used: 1 person hand held assist Transfers: Sit to/from [employer] Sit to  Stand: Min guard;+2 safety/equipment Stand pivot transfers: Min assist;+2 safety/equipment       General transfer comment: Impulsively standing from EOB x1, from chair x1 with truncal ataxia present; assist of 2 for safety with lines. SPT to chair Min A for balance due to LE ataxia. Stood from chair x3.    Balance Overall balance assessment: Needs assistance Sitting-balance support: Feet supported;No upper extremity supported Sitting balance-Leahy Scale: Fair Sitting balance - Comments: lists left and posteriorly during movements due to ataxia but able to self correct. When moving forward to EOB, requires min A due ot forward propulsion.   Standing balance support: During functional activity;Single extremity supported Standing balance-Leahy Scale: Poor Standing balance comment: Requires UE support for standing- static and dynamic. Less compensation of head movements today.                           ADL either performed or assessed with clinical judgement   ADL Overall ADL's : Needs assistance/impaired                     Lower Body Dressing: Supervision/safety;Sitting/lateral leans Lower Body Dressing Details (indicate cue type and reason): able to don socks sitting in bed Toilet Transfer: +2 for safety/equipment;Moderate assistance;Ambulation (using hallway rail)           Functional mobility during ADLs: Moderate assistance;+2 for physical assistance;Cueing for safety;Cueing for sequencing;Rolling walker General ADL Comments: L UE ataxic with movement and pt with poor spatial awareness and depth perception impacting function with L UE reaching.     Vision   Additional  Comments: Pt able to track to the L with L eye but minimally to the R and reports no diplopia. Disconjugate gaze present throughout. Challenged ability to stabilize gaze and utilize binocular vision with improved ability to incorporate this into ADL.   Perception     Praxis      Cognition  Arousal/Alertness: Awake/alert Behavior During Therapy: WFL for tasks assessed/performed;Impulsive Overall Cognitive Status: Impaired/Different from baseline Area of Impairment: Safety/judgement;Attention                   Current Attention Level: Selective     Safety/Judgement: Decreased awareness of safety     General Comments: Impulsive with mobility and aware of deficits mostly. VC's for safety throughout.        Exercises     Shoulder Instructions       General Comments Not tracking with left eye today. Spouse present during session.    Pertinent Vitals/ Pain       Pain Assessment: 0-10 Pain Score: 5  Faces Pain Scale: Hurts little more Pain Location: head Pain Descriptors / Indicators: Aching;Headache Pain Intervention(s): Monitored during session;Premedicated before session  Home Living                                          Prior Functioning/Environment              Frequency  Min 2X/week        Progress Toward Goals  OT Goals(current goals can now be found in the care plan section)  Progress towards OT goals: Progressing toward goals  Acute Rehab OT Goals Patient Stated Goal: Return home OT Goal Formulation: With patient/family Time For Goal Achievement: 04/23/17 Potential to Achieve Goals: Good ADL Goals Pt Will Perform Grooming: with supervision;sitting Pt Will Perform Upper Body Dressing: with supervision;sitting Pt Will Perform Lower Body Dressing: with min assist;sit to/from stand Pt Will Transfer to Toilet: with supervision;ambulating;regular height toilet Pt Will Perform Toileting - Clothing Manipulation and hygiene: with supervision;sit to/from stand Pt/caregiver will Perform Home Exercise Program: Both right and left upper extremity;Increased strength;With written HEP provided Additional ADL Goal #1: Pt will demonstrate improved functional use of vision by incorporating compensatory strategies for double  vision with L gaze during ADL.  Plan Discharge plan remains appropriate    Co-evaluation    PT/OT/SLP Co-Evaluation/Treatment: Yes Reason for Co-Treatment: Complexity of the patient's impairments (multi-system involvement);For patient/therapist safety;To address functional/ADL transfers PT goals addressed during session: Mobility/safety with mobility OT goals addressed during session: ADL's and self-care;Strengthening/ROM      AM-PAC PT "6 Clicks" Daily Activity     Outcome Measure   Help from another person eating meals?: A Little Help from another person taking care of personal grooming?: A Lot Help from another person toileting, which includes using toliet, bedpan, or urinal?: A Lot Help from another person bathing (including washing, rinsing, drying)?: A Lot Help from another person to put on and taking off regular upper body clothing?: A Little Help from another person to put on and taking off regular lower body clothing?: A Lot 6 Click Score: 14    End of Session Equipment Utilized During Treatment: Gait belt;Rolling walker  OT Visit Diagnosis: Unsteadiness on feet (R26.81);Low vision, both eyes (H54.2);Muscle weakness (generalized) (M62.81)   Activity Tolerance Patient tolerated treatment well   Patient Left in bed;with call bell/phone within reach;with family/visitor present  Nurse Communication Mobility status        Time: [id number] OT Time Calculation (min): 36 min  Charges: OT General Charges $OT Visit: 1 Procedure OT Treatments $Therapeutic Activity: 8-22 mins  Doristine Sectionharity A Corrinne Benegas, MS OTR/L  Pager: [phone]    Savino Whisenant A Anielle Headrick 04/15/2017, 1:43 PM

## 2017-04-15 NOTE — Progress Notes (Signed)
Physical Therapy Treatment Patient Details Name: [name] Barnett MRN: [id number] DOB: 02/14/1976 Today's Date: 04/15/2017    History of Present Illness Pt is a 41 y.o. female admitted to ED on 04/07/17 with occipital headache, nausea/vomitting, and LOC leading to fall striking her head. 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 S/p coil embolization 4/29. 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 Pertinent PMH includes HTN, GERD.     PT Comments    Patient progressing slowly with mobility. Continues to demonstrate truncal and LE ataxia most notable during functional mobility and gait training. Tolerated short bouts of gait training to focus more on quality of gait mechanics as pt's ataxia tends to worsen with fatigue. Pt more aware of midline today with less use of head listing to self correct for skewed version of upright. Continues to exhibit visual deficits and impulsivity putting pt at increased risk for falls. Highly motivated. Will continue to follow.   Follow Up Recommendations  CIR;Supervision for mobility/OOB     Equipment Recommendations  Other (comment) (TBA)    Recommendations for Other Services       Precautions / Restrictions Precautions Precautions: Fall Precaution Comments: EVD Restrictions Weight Bearing Restrictions: No    Mobility  Bed Mobility Overal bed mobility: Needs Assistance Bed Mobility: Supine to Sit     Supine to sit: Min assist;HOB elevated     General bed mobility comments: pt impulsive with movement- truncal ataxia present getting to EOB, assist for steadying.   Transfers Overall transfer level: Needs assistance Equipment used: 1 person hand held assist Transfers: Sit to/from [employer] Sit to Stand: Min guard;+2 safety/equipment Stand pivot transfers: Min assist;+2 safety/equipment       General transfer comment: Impulsively standing  from EOB x1, from chair x1 with truncal ataxia present; assist of 2 for safety with lines. SPT to chair Min A for balance due to LE ataxia. Stood from chair x3.  Ambulation/Gait Ambulation/Gait assistance: Mod assist;+2 safety/equipment Ambulation Distance (Feet): 16 Feet (x3 bouts) Assistive device: 1 person hand held assist (rail for support) Gait Pattern/deviations: Step-to pattern;Step-through pattern;Decreased step length - right;Decreased step length - left;Ataxic;Staggering right;Staggering left;Narrow base of support;Decreased dorsiflexion - left;Decreased dorsiflexion - right Gait velocity: Decreased Gait velocity interpretation: Below normal speed for age/gender General Gait Details: Using rail and HHA for support; initially step to gait progressing to step through with increased ataxia BLEs. Bil forefoot contact with gross overstepping of LLE. LOB x2 towards left. Difficulty with visual deficits. 3 seated rest breaks. Able to obtain midline most of time today.   Stairs            Wheelchair Mobility    Modified Rankin (Stroke Patients Only) Modified Rankin (Stroke Patients Only) Pre-Morbid Rankin Score: No symptoms Modified Rankin: Moderately severe disability     Balance Overall balance assessment: Needs assistance Sitting-balance support: Feet supported;No upper extremity supported Sitting balance-Leahy Scale: Fair Sitting balance - Comments: lists left and posteriorly during movements due to ataxia but able to self correct. When moving forward to EOB, requires min A due ot forward propulsion.   Standing balance support: During functional activity;Single extremity supported Standing balance-Leahy Scale: Poor Standing balance comment: Requires UE support for standing- static and dynamic. Less compensation of head movements today.  Cognition Arousal/Alertness: Awake/alert Behavior During Therapy: WFL for tasks  assessed/performed;Impulsive Overall Cognitive Status: Impaired/Different from baseline Area of Impairment: Safety/judgement;Attention                   Current Attention Level: Selective     Safety/Judgement: Decreased awareness of safety     General Comments: Some impulsivity- aware of most of her deficits. Not baseline per spouse.      Exercises      General Comments General comments (skin integrity, edema, etc.): Not tracking with left eye today. Spouse present during session.      Pertinent Vitals/Pain Pain Assessment: 0-10 Pain Score: 5  Pain Location: head Pain Descriptors / Indicators: Aching;Headache Pain Intervention(s): Monitored during session;Repositioned;Premedicated before session    Home Living                      Prior Function            PT Goals (current goals can now be found in the care plan section) Progress towards PT goals: Progressing toward goals    Frequency           PT Plan Current plan remains appropriate    Co-evaluation PT/OT/SLP Co-Evaluation/Treatment: Yes Reason for Co-Treatment: Complexity of the patient's impairments (multi-system involvement);To address functional/ADL transfers;For patient/therapist safety PT goals addressed during session: Mobility/safety with mobility        AM-PAC PT "6 Clicks" Daily Activity  Outcome Measure  Difficulty turning over in bed (including adjusting bedclothes, sheets and blankets)?: None Difficulty moving from lying on back to sitting on the side of the bed? : Total Difficulty sitting down on and standing up from a chair with arms (e.g., wheelchair, bedside commode, etc,.)?: Total Help needed moving to and from a bed to chair (including a wheelchair)?: A Little Help needed walking in hospital room?: A Lot Help needed climbing 3-5 steps with a railing? : A Lot 6 Click Score: 13    End of Session Equipment Utilized During Treatment: Gait belt Activity Tolerance:  Patient tolerated treatment well Patient left: in chair;with call bell/phone within reach;with family/visitor present;Other (comment);with nursing/sitter in room (OT present) Nurse Communication: Mobility status PT Visit Diagnosis: Other abnormalities of gait and mobility (R26.89);Ataxic gait (R26.0)     Time: [id number] PT Time Calculation (min) (ACUTE ONLY): 27 min  Charges:  $Gait Training: 8-22 mins                    G Codes:       Mylo RedShauna Michaiah Maiden, PT, DPT [phone]     Blake DivineShauna A Javonda Suh 04/15/2017, 11:57 AM

## 2017-04-15 NOTE — Progress Notes (Signed)
Transcranial Doppler  Date POD PCO2 HCT BP  MCA ACA PCA OPHT SIPH VERT Basilar  4/30/ rds     Right  Left   73  75   -67  -57   50  47   52  26   46  77   -58  -40   -52      04/10/17 rds     Right  Left   66  146   -71  -109   41  51   17  25   83  70   -66  -60   -60      5/4 JE     Right  Left   83  90   -76  -66   43  44   33  43   89  101   -43  -51   -60      5/7/rds      Right  Left   140  114   -129  -109   27  46   26  18   62  36   -58  -37   -54            Right  Left                                            Right  Left                                            Right  Left                                        MCA = Middle Cerebral Artery      OPHT = Opthalmic Artery     BASILAR = Basilar Artery   ACA = Anterior Cerebral Artery     SIPH = Carotid Siphon PCA = Posterior Cerebral Artery   VERT = Verterbral Artery                   Normal MCA = 62+\-12 ACA = 50+\-12 PCA = 42+\-23   04/08/17 - Lindegaard Ratio - Right 2.2, Left 2.4 rds 04/10/17 - Lindegaard Ratio - Right 2.0, Left 4.1 rds 5/4 Lindegaard ratio- right 1.6. Left 2.0. 04/15/17 - Lindegaard ratio - Right 5.2, Left 5.3, rds

## 2017-04-15 NOTE — Procedures (Signed)
Objective Swallowing Evaluation: Type of Study: FEES-Fiberoptic Endoscopic Evaluation of Swallow  Patient Details  Name: [name] Barnett MRN: [id number] Date of Birth: [birth year]  Today's Date: 04/15/2017 Time: SLP Start Time (ACUTE ONLY): 1320-SLP Stop Time (ACUTE ONLY): 1400 SLP Time Calculation (min) (ACUTE ONLY): 40 min  Past Medical History:  Past Medical History:  Diagnosis Date  . Duodenal obstruction   . GERD (gastroesophageal reflux disease)   . Hypertension    Past Surgical History:  Past Surgical History:  Procedure Laterality Date  . BALLOON DILATION  12/31/2011   Procedure: BALLOON DILATION;  Surgeon: Barrie Folk, MD;  Location: [facility] ENDOSCOPY;  Service: Endoscopy;  Laterality: N/A;  . CHOLECYSTECTOMY  07/28/11  . ESOPHAGOGASTRODUODENOSCOPY  10/17/2011   Procedure: ESOPHAGOGASTRODUODENOSCOPY (EGD);  Surgeon: Barrie Folk, MD;  Location: [facility] ENDOSCOPY;  Service: Endoscopy;  Laterality: N/A;  . RADIOLOGY WITH ANESTHESIA N/A 04/07/2017   Procedure: RADIOLOGY WITH ANESTHESIA;  Surgeon: Lisbeth Renshaw, MD;  Location: [facility] OR;  Service: Radiology;  Laterality: N/A;  . TEMPOROMANDIBULAR JOINT SURGERY     2 surgeries  . VAGOTOMY  01/23/2012   Procedure: VAGOTOMY, antrectomy and BII;  Surgeon: Currie Paris, MD;  Location: [facility] OR;  Service: General;  Laterality: N/A;  Laparotomy with vagotomy.   HPI: 41 year old female admitted on 4/29 with acute aneurysmal SAH s/p coiling. 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 Pt initially passed the stroke swallow screen but was observed to be coughing with thin liquid intake.   Subjective: pt alert, hungry   Assessment / Plan / Recommendation  CHL IP CLINICAL IMPRESSIONS 04/15/2017  Clinical Impression Pt has a mild pharyngeal dysphagia characterized by impaired timing, with late glottal closure allowing for intermittent aspiration before the swallow with thin liquids. Pt consistently triggers a cough  reflex when aspiration occurs that appears strong, although it is difficult to visualize the anterior portion of her vocal cords to assess for full clearance and pt did not tolerate SLP attempts to advance the scope further. Pt's timing was improved with use of chin tuck that helped contain the thin liquids before she swallowed. Recommend a regular diet and thin liquids as long as pt can utilize a chin tuck while drinking.  SLP Visit Diagnosis Dysphagia, pharyngeal phase (R13.13)  Attention and concentration deficit following --  Frontal lobe and executive function deficit following --  Impact on safety and function Mild aspiration risk      CHL IP TREATMENT RECOMMENDATION 04/15/2017  Treatment Recommendations Therapy as outlined in treatment plan below     Prognosis 04/15/2017  Prognosis for Safe Diet Advancement Good  Barriers to Reach Goals --  Barriers/Prognosis Comment --    CHL IP DIET RECOMMENDATION 04/15/2017  SLP Diet Recommendations Regular solids;Thin liquid  Liquid Administration via Cup;Straw  Medication Administration Whole meds with puree  Compensations Slow rate;Small sips/bites;Chin tuck  Postural Changes Seated upright at 90 degrees      CHL IP OTHER RECOMMENDATIONS 04/15/2017  Recommended Consults --  Oral Care Recommendations Oral care BID  Other Recommendations --      CHL IP FOLLOW UP RECOMMENDATIONS 04/15/2017  Follow up Recommendations Inpatient Rehab      CHL IP FREQUENCY AND DURATION 04/15/2017  Speech Therapy Frequency (ACUTE ONLY) min 2x/week  Treatment Duration 2 weeks           CHL IP ORAL PHASE 04/15/2017  Oral Phase WFL  Oral - Pudding Teaspoon --  Oral -  Pudding Cup --  Oral - Honey Teaspoon --  Oral - Honey Cup --  Oral - Nectar Teaspoon --  Oral - Nectar Cup --  Oral - Nectar Straw --  Oral - Thin Teaspoon --  Oral - Thin Cup --  Oral - Thin Straw --  Oral - Puree --  Oral - Mech Soft --  Oral - Regular --  Oral - Multi-Consistency --   Oral - Pill --  Oral Phase - Comment --    CHL IP PHARYNGEAL PHASE 04/15/2017  Pharyngeal Phase Impaired  Pharyngeal- Pudding Teaspoon --  Pharyngeal --  Pharyngeal- Pudding Cup --  Pharyngeal --  Pharyngeal- Honey Teaspoon --  Pharyngeal --  Pharyngeal- Honey Cup --  Pharyngeal --  Pharyngeal- Nectar Teaspoon --  Pharyngeal --  Pharyngeal- Nectar Cup --  Pharyngeal --  Pharyngeal- Nectar Straw --  Pharyngeal --  Pharyngeal- Thin Teaspoon --  Pharyngeal --  Pharyngeal- Thin Cup Delayed swallow initiation-pyriform sinuses;Penetration/Aspiration before swallow  Pharyngeal Material enters airway, passes BELOW cords and not ejected out despite cough attempt by patient  Pharyngeal- Thin Straw Delayed swallow initiation-pyriform sinuses;Penetration/Aspiration before swallow;Compensatory strategies attempted (with notebox)  Pharyngeal Material enters airway, passes BELOW cords and not ejected out despite cough attempt by patient  Pharyngeal- Puree --  Pharyngeal --  Pharyngeal- Mechanical Soft --  Pharyngeal --  Pharyngeal- Regular WFL  Pharyngeal --  Pharyngeal- Multi-consistency --  Pharyngeal --  Pharyngeal- Pill --  Pharyngeal --  Pharyngeal Comment --     CHL IP CERVICAL ESOPHAGEAL PHASE 04/15/2017  Cervical Esophageal Phase WFL  Pudding Teaspoon --  Pudding Cup --  Honey Teaspoon --  Honey Cup --  Nectar Teaspoon --  Nectar Cup --  Nectar Straw --  Thin Teaspoon --  Thin Cup --  Thin Straw --  Puree --  Mechanical Soft --  Regular --  Multi-consistency --  Pill --  Cervical Esophageal Comment --    No flowsheet data found.  Maxcine Hamaiewonsky, Ltanya Bayley 04/15/2017, 2:09 PM   Maxcine HamLaura Paiewonsky, M.A. CCC-SLP [phone]

## 2017-04-16 DIAGNOSIS — R270 Ataxia, unspecified: Secondary | ICD-10-CM

## 2017-04-16 NOTE — Progress Notes (Signed)
Physical Therapy Treatment Patient Details Name: [name] Barnett MRN: [id number] DOB: [birth year] Today's Date: 04/16/2017    History of Present Illness Pt is a 41 y.o. female admitted to ED on 04/07/17 with occipital headache, nausea/vomitting, and LOC leading to fall striking her head. 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 S/p coil embolization 4/29. 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 Pertinent PMH includes HTN, GERD.     PT Comments    Patient complaining of left hip pain today impacting gait training. Continues to demonstrate truncal and LE ataxia. Pt with visual deficits affecting balance and mobility. Tolerated gait training and focused on static standing balance today. Requires Mod A for gait training. Motivated to work with PT. Will follow acutely.    Follow Up Recommendations  CIR;Supervision for mobility/OOB     Equipment Recommendations  Other (comment) (TBA)    Recommendations for Other Services       Precautions / Restrictions Precautions Precautions: Fall Precaution Comments: EVD Restrictions Weight Bearing Restrictions: No    Mobility  Bed Mobility Overal bed mobility: Needs Assistance Bed Mobility: Supine to Sit     Supine to sit: Min guard;HOB elevated     General bed mobility comments: pt impulsive with movement- truncal ataxia present getting to EOB, assist for steadying.   Transfers Overall transfer level: Needs assistance Equipment used: None Transfers: Sit to/from [employer] Sit to Stand: Min guard;+2 safety/equipment Stand pivot transfers: Min assist;+2 safety/equipment       General transfer comment: Impulsively standing from EOB x1, from chair x1 with truncal ataxia present. SPT to chair Min A for balance due to LLE ataxia. Stood from chair x3.  Ambulation/Gait Ambulation/Gait assistance: Mod assist;+2  safety/equipment Ambulation Distance (Feet): 18 Feet (x3 bouts) Assistive device: 1 person hand held assist (rail for support) Gait Pattern/deviations: Step-to pattern;Step-through pattern;Decreased step length - right;Decreased step length - left;Ataxic;Staggering right;Staggering left;Narrow base of support;Decreased dorsiflexion - left;Decreased dorsiflexion - right Gait velocity: Decreased Gait velocity interpretation: Below normal speed for age/gender General Gait Details: Using rail and HHA for support; initially step to gait progressing to step through with increased ataxia BLEs. Bil forefoot contact with gross overstepping of LLE. Pain with WB LLE. LOB x4 towards left. Cues for gaze stabilization.   Stairs            Wheelchair Mobility    Modified Rankin (Stroke Patients Only) Modified Rankin (Stroke Patients Only) Pre-Morbid Rankin Score: No symptoms Modified Rankin: Moderately severe disability     Balance Overall balance assessment: Needs assistance Sitting-balance support: Feet supported;No upper extremity supported Sitting balance-Leahy Scale: Fair Sitting balance - Comments: lists left and posteriorly during movements due to ataxia but able to self correct. When moving forward to EOB, requires min A due ot forward propulsion.   Standing balance support: During functional activity;Single extremity supported Standing balance-Leahy Scale: Poor Standing balance comment: Requires UE support for standing- static and dynamic. Practiced static standing balance with gaze stabilization- posterior lean noted, pt able to self correct for a few secs with max cues.                            Cognition Arousal/Alertness: Awake/alert Behavior During Therapy: WFL for tasks assessed/performed Overall Cognitive Status: Impaired/Different from baseline Area of Impairment: Safety/judgement;Attention  Current Attention Level: Selective      Safety/Judgement: Decreased awareness of safety     General Comments: Impulsive with mobility at times- aware of deficits. Cues for safety.      Exercises      General Comments        Pertinent Vitals/Pain Pain Assessment: Faces Faces Pain Scale: Hurts a little bit Pain Location: head Pain Descriptors / Indicators: Aching;Headache Pain Intervention(s): Monitored during session;Repositioned;Premedicated before session    Home Living                      Prior Function            PT Goals (current goals can now be found in the care plan section) Progress towards PT goals: Progressing toward goals    Frequency    Min 4X/week      PT Plan Current plan remains appropriate    Co-evaluation              AM-PAC PT "6 Clicks" Daily Activity  Outcome Measure  Difficulty turning over in bed (including adjusting bedclothes, sheets and blankets)?: None Difficulty moving from lying on back to sitting on the side of the bed? : None Difficulty sitting down on and standing up from a chair with arms (e.g., wheelchair, bedside commode, etc,.)?: Total Help needed moving to and from a bed to chair (including a wheelchair)?: A Little Help needed walking in hospital room?: A Lot Help needed climbing 3-5 steps with a railing? : A Lot 6 Click Score: 16    End of Session Equipment Utilized During Treatment: Gait belt Activity Tolerance: Patient tolerated treatment well Patient left: in chair;with call bell/phone within reach;with chair alarm set Nurse Communication: Mobility status PT Visit Diagnosis: Other abnormalities of gait and mobility (R26.89);Ataxic gait (R26.0)     Time: [id number] PT Time Calculation (min) (ACUTE ONLY): 32 min  Charges:  $Gait Training: 8-22 mins $Neuromuscular Re-education: 8-22 mins                    G Codes:       Mylo RedShauna Shuntavia Yerby, PT, DPT [phone]     Blake DivineShauna A Ibtisam Benge 04/16/2017, 1:18 PM

## 2017-04-16 NOTE — Consult Note (Signed)
Physical Medicine and Rehabilitation Consult   Reason for Consult: Ruptured SAH Referring Physician:  Dr. Conchita ParisNundkumar   HPI: [name] Barnett is a 41 y.o. female with history of HTN, GERD who was admitted on 04/07/17 with worsening of HA, episode of emesis followed by LOC with fall and LLE weakness. 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 She was taken to OR by Dr. Conchita ParisNundkumar and underwent coil embolization of basilar artery aneurysm.[name]. 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 EVD placed at bedside with improvement in LOC and IS used to help improve low lung volumes. Patient with resultant balance deficits, left gaze palsy with decreased vision Left > Right as well as headaches .  EVD clamped today and CT head to be repeated tomorrow. MBS done due to reports of coughing with liquids and chin tuck recommended with liquids to prevent aspiration. Therapy ongoing and patient with   E She was     Review of Systems  HENT: Negative for hearing loss.   Eyes: Positive for blurred vision. Negative for pain.  Respiratory: Negative for cough and shortness of breath.   Cardiovascular: Negative for chest pain, palpitations and leg swelling.  Gastrointestinal: Positive for constipation. Negative for heartburn and nausea.       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  Skin: Negative for itching and rash.  Neurological: Positive for dizziness, speech change, focal weakness, weakness and headaches.  Psychiatric/Behavioral: Negative for depression. The patient is not nervous/anxious.    Past Medical History:  Diagnosis Date  . Duodenal obstruction     . GERD (gastroesophageal reflux disease)   . Hypertension    Past Surgical History:  Procedure Laterality Date  . BALLOON DILATION  12/31/2011   Procedure: BALLOON DILATION;  Surgeon: Barrie FolkJohn C Hayes, MD;  [facility];  Service: Endoscopy;  Laterality: N/A;  . CHOLECYSTECTOMY  07/28/11  . ESOPHAGOGASTRODUODENOSCOPY  10/17/2011   Procedure: ESOPHAGOGASTRODUODENOSCOPY (EGD);  Surgeon: Barrie FolkJohn C Hayes, MD;  [facility];  Service: Endoscopy;  Laterality: N/A;  . RADIOLOGY WITH ANESTHESIA N/A 04/07/2017   Procedure: RADIOLOGY WITH ANESTHESIA;  Surgeon: [name], MD;  Location: [facility] OR;  Service: Radiology;  Laterality: N/A;  . TEMPOROMANDIBULAR JOINT SURGERY     2 surgeries  . VAGOTOMY  01/23/2012   Procedure: VAGOTOMY, antrectomy and BII;  Surgeon: [name], MD;  [facility];  Service: General;  Laterality: N/A;  Laparotomy with vagotomy.    Family History  Problem Relation Age of Onset  . Malignant hyperthermia Neg Hx     Social History: Married. Husband works nights for [employer]. Works in an office. She reports that she has been smoking Cigarettes  1- 1.5 PPD depending on stress.  She has a 20.00 pack-year smoking history. She has never used smokeless tobacco. She drinks 10 mixed drinks (T&T) on the weekends. She does not use drugs.    Allergies  Allergen Reactions  . 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 ibuprofen (ADVIL,MOTRIN) 200 MG tablet Take 200 mg by  mouth every 6 (six) hours as needed for moderate pain.    . feeding supplement (RESOURCE BREEZE) LIQD Take 1 Container by mouth 2 (two) times daily with a meal. (Patient not taking: Reported on 04/07/2017)    . pantoprazole (PROTONIX) 40 MG tablet Take 1 tablet (40 mg total) by mouth 2 (two) times daily before a meal. 60 tablet 1    Home: Home Living Family/patient expects to be  discharged to:: Private residence Living Arrangements: Spouse/significant other, Children Available Help at Discharge: Family, Available 24 hours/day Type of Home: House Home Access: Stairs to enter [employer] of Steps: 3 Entrance Stairs-Rails: Can reach both Home Layout: One level Bathroom Shower/Tub: Walk-in shower (and Secondary school teacher) Bathroom Toilet: Standard Home Equipment: Crutches Additional Comments: Husband works night; mother and mother-in-law live nearby and are able to help if needed.  Functional History: Prior Function Level of Independence: Independent Comments: Works as Airline pilot; drives Functional Status:  Mobility: Bed Mobility Overal bed mobility: Needs Assistance Bed Mobility: Supine to Sit Supine to sit: Min assist, HOB elevated Sit to supine: Supervision General bed mobility comments: pt impulsive with movement- truncal ataxia present getting to EOB, assist for steadying.  Transfers Overall transfer level: Needs assistance Equipment used: 1 person hand held assist Transfers: Sit to/from Stand, Stand Pivot Transfers Sit to Stand: Min guard, +2 safety/equipment Stand pivot transfers: Min assist, +2 safety/equipment General transfer comment: Impulsively standing from EOB x1, from chair x1 with truncal ataxia present; assist of 2 for safety with lines. SPT to chair Min A for balance due to LE ataxia. Stood from chair x3. Ambulation/Gait Ambulation/Gait assistance: Mod assist, +2 safety/equipment Ambulation Distance (Feet): 16 Feet (x3 bouts) Assistive device: 1 person hand held assist (rail for support) Gait Pattern/deviations: Step-to pattern, Step-through pattern, Decreased step length - right, Decreased step length - left, Ataxic, Staggering right, Staggering left, Narrow base of support, Decreased dorsiflexion - left, Decreased dorsiflexion - right General Gait Details: Using rail and HHA for support; initially step to gait progressing to step  through with increased ataxia BLEs. Bil forefoot contact with gross overstepping of LLE. LOB x2 towards left. Difficulty with visual deficits. 3 seated rest breaks. Able to obtain midline most of time today. Gait velocity: Decreased Gait velocity interpretation: Below normal speed for age/gender    ADL: ADL Overall ADL's : Needs assistance/impaired Eating/Feeding: Set up (sitting up in bed) Grooming: Minimal assistance Grooming Details (indicate cue type and reason): Sitting up in bed; Dizziness impacting ability to sit without back support. Upper Body Bathing: Minimal assistance, Sitting (with back support) Lower Body Bathing: Maximal assistance, Sit to/from stand Lower Body Bathing Details (indicate cue type and reason): Due to dizziness Upper Body Dressing : Minimal assistance, Sitting (with back support) Lower Body Dressing: Supervision/safety, Sitting/lateral leans Lower Body Dressing Details (indicate cue type and reason): able to don socks sitting in bed Toilet Transfer: +2 for safety/equipment, Moderate assistance, Ambulation (using hallway rail) Toilet Transfer Details (indicate cue type and reason): One person handheld assist Toileting- Clothing Manipulation and Hygiene: Maximal assistance, Sit to/from stand Functional mobility during ADLs: Moderate assistance, +2 for physical assistance, Cueing for safety, Cueing for sequencing, Rolling walker General ADL Comments: L UE ataxic with movement and pt with poor spatial awareness and depth perception impacting function with L UE reaching.  Cognition: Cognition Overall Cognitive Status: Impaired/Different from baseline Arousal/Alertness: Awake/alert Orientation Level: Oriented X4 Attention: Alternating Alternating Attention: Impaired Alternating Attention Impairment: Functional basic, Verbal basic Memory: Impaired Memory Impairment: Decreased recall  of new information, Retrieval deficit Awareness: Impaired Awareness Impairment:  Anticipatory impairment Problem Solving: Appears intact Safety/Judgment: Impaired Cognition Arousal/Alertness: Awake/alert Behavior During Therapy: WFL for tasks assessed/performed, Impulsive Overall Cognitive Status: Impaired/Different from baseline Area of Impairment: Safety/judgement, Attention Current Attention Level: Selective Safety/Judgement: Decreased awareness of safety Problem Solving: Requires verbal cues, Requires tactile cues General Comments: Impulsive with mobility and aware of deficits mostly. VC's for safety throughout.  Blood pressure (!) 147/71, pulse (!) 58, temperature 98.4 F (36.9 C), temperature source Oral, resp. rate 18, height 5\' 4"  (1.626 m), weight 65.8 kg (145 lb 1 oz), SpO2 98 %. Physical Exam  Nursing note and vitals reviewed. Constitutional: She is oriented to person, place, and time. She appears well-developed and well-nourished.  HENT:  Head: Normocephalic.  Mouth/Throat: Oropharynx is clear and moist.  Eyes: Left conjunctiva has a hemorrhage. Right eye exhibits nystagmus. Left eye exhibits abnormal extraocular motion and nystagmus. Left pupil is not reactive.  Left peri-orbital ecchymosis with dysconjugate gaze. Left eye with minimal movement.   Neck: Normal range of motion. Neck supple.  Cardiovascular: Normal rate and regular rhythm.   Respiratory: Effort normal and breath sounds normal. No stridor. No respiratory distress. She has no wheezes.  GI: Soft. Bowel sounds are normal. She exhibits no distension. There is no tenderness.  Musculoskeletal: She exhibits no edema.  Bilateral hip discomfort with SLR.   Neurological: She is alert and oriented to person, place, and time. A cranial nerve deficit is present. Coordination abnormal.  Right  facial weakness. Speech clear. Horizontal nystagmus with visual deficits in superior and inferior visual field. Has difficulty with horizontal tracking as well.  Also has some difficulty tracking superiorly.  Ataxia right upper and lower extremity. Right pronator drift. Strength 4/5 RUE and RLE. 4+ LUE and LLE. Fair insight and awareness    Skin: Skin is warm and dry.  Psychiatric: She has a normal mood and affect. Her behavior is normal. Thought content normal.  A bit impulsive and disinhibited    No results found for this or any previous visit (from the past 24 hour(s)). No results found.  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 Does the need for close, 24 hr/day medical supervision in concert with the patient's rehab needs make it unreasonable for this patient to be served in a less intensive setting? Yes 2. Co-Morbidities requiring supervision/potential complications: htn, 3. Due to bladder management, bowel management, safety, skin/wound care, disease management, medication administration, pain management and patient education, does the patient require 24 hr/day rehab nursing? Yes 4. Does the patient require coordinated care of a physician, rehab nurse, PT (1-2 hrs/day, 5 days/week) and OT (1-2 hrs/day, 5 days/week) to address physical and functional deficits in the context of the above medical diagnosis(es)? Yes Addressing deficits in the following areas: balance, endurance, locomotion, strength, transferring, bowel/bladder control, bathing, dressing, feeding, grooming, toileting and psychosocial support 5. Can the patient actively participate in an intensive therapy program of at least 3 hrs of therapy per day at least 5 days per week? Yes 6. The potential for patient to make measurable gains while on inpatient rehab is excellent 7. Anticipated functional outcomes upon discharge from inpatient rehab are modified independent and supervision  with PT, modified independent and supervision with OT, n/a with SLP. 8. Estimated rehab length of stay to reach the above functional goals is: 9-12 days 9. Does the patient have  adequate social supports and living environment to accommodate these  discharge functional goals? Yes 10. Anticipated D/C setting: Home 11. Anticipated post D/C treatments: HH therapy and Outpatient therapy 12. Overall Rehab/Functional Prognosis: excellent  RECOMMENDATIONS: This patient's condition is appropriate for continued rehabilitative care in the following setting: CIR Patient has agreed to participate in recommended program. Yes Note that insurance prior authorization may be required for reimbursement for recommended care.  Comment: Pt was active and independent prior to admit.  Rehab Admissions Coordinator to follow up.  Thanks,  Ranelle Oyster, MD, Earlie Counts, PA-C 04/16/2017

## 2017-04-16 NOTE — Progress Notes (Signed)
I will begin insurance authorization with [employer] [place] and f/u with pt and family for possible admission later this week. [phone]

## 2017-04-16 NOTE — Progress Notes (Signed)
  Speech Language Pathology Treatment: Dysphagia;Cognitive-Linquistic  Patient Details Name: [name] Barnett MRN: [id number] DOB: 08/26/1976 Today's Date: 04/16/2017 Time: [id number] SLP Time Calculation (min) (ACUTE ONLY): 10 min  Assessment / Plan / Recommendation Clinical Impression  Pt verbalized results and recommendations from FEES on previous date with Mod I; however, she needed Mod cues for recall and implementation of chin tuck strategy while consuming her lunch tray. An immediate cough was observed x1 when she did not keep her chin tucked throughout the duration of her drinking. She showed good emergent awareness, stating that the chin tuck "really works" as she noticed it eliminated any further coughing. Continue to recommend CIR for f/u.   HPI HPI: 41 year old female admitted on 4/29 with acute aneurysmal SAH s/p coiling. 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 Pt initially passed the stroke swallow screen but was observed to be coughing with thin liquid intake.       SLP Plan  Continue with current plan of care       Recommendations  Diet recommendations: Regular;Thin liquid Liquids provided via: Cup;Straw Medication Administration: Whole meds with puree Supervision: Patient able to self feed;Full supervision/cueing for compensatory strategies Compensations: Slow rate;Small sips/bites;Chin tuck Postural Changes and/or Swallow Maneuvers: Seated upright 90 degrees                Oral Care Recommendations: Oral care BID Follow up Recommendations: Inpatient Rehab SLP Visit Diagnosis: Dysphagia, pharyngeal phase (R13.13);Cognitive communication deficit (H08.657(R41.841) Plan: Continue with current plan of care       GO                Maxcine Hamaiewonsky, Rosiland Sen 04/16/2017, 2:34 PM  Maxcine HamLaura Paiewonsky, M.A. CCC-SLP [phone]

## 2017-04-16 NOTE — Progress Notes (Signed)
Pt seen and examined. No issues overnight. Says Fioricet works much better for her HA.   EXAM: Temp:  [97.8 F (36.6 C)-98.6 F (37 C)] 98.4 F (36.9 C) (05/08 0800) Pulse Rate:  [57-101] 58 (05/08 0800) Resp:  [11-21] 13 (05/08 0800) BP: (131-174)/(59-96) 174/83 (05/08 0800) SpO2:  [96 %-100 %] 98 % (05/08 0800) Intake/Output      05/08 0730 - 05/09 0729   I.V. (mL/kg) 75 (1.1)   Total Intake(mL/kg) 75 (1.1)   Urine (mL/kg/hr)    Drains 0 (0)   Total Output 0   Net +75        Awake, alert, oriented Speech fluent Left CN III/VI palsy Good strength EVD in place, clear CSF open @ 15mmHg  LABS: Lab Results  Component Value Date   CREATININE 0.43 (L) 04/10/2017   BUN <5 (L) 04/10/2017   NA 140 04/10/2017   K 3.3 (L) 04/10/2017   CL 106 04/10/2017   CO2 27 04/10/2017   Lab Results  Component Value Date   WBC 12.7 (H) 04/10/2017   HGB 8.7 (L) 04/10/2017   HCT 29.4 (L) 04/10/2017   MCV 83.1 04/10/2017   PLT 318 04/10/2017   TCD reviewed, not concerning for spasm, velocities nearly normal.  IMPRESSION: - 41 y.o. female SAH d#10 s/p basilar coiling, neurologically stable.   PLAN: - Will clamp EVD today, CTH in am - Cont to mobilize as tolerated, assuming she is not going to be shunt dependant, may be ready for CIR later this week. - Cont supportive care, nimotop, TCD

## 2017-04-17 ENCOUNTER — Inpatient Hospital Stay (HOSPITAL_COMMUNITY): Payer: BLUE CROSS/BLUE SHIELD

## 2017-04-17 DIAGNOSIS — I609 Nontraumatic subarachnoid hemorrhage, unspecified: Secondary | ICD-10-CM

## 2017-04-17 MED ORDER — LABETALOL HCL 5 MG/ML IV SOLN: 10.0000 mg | INTRAVENOUS | Status: DC | PRN

## 2017-04-17 NOTE — Progress Notes (Signed)
Physical Therapy Treatment Patient Details Name: [name] Barnett MRN: [id number] DOB: [birth year] Today's Date: 04/17/2017    History of Present Illness Pt is a 41 y.o. female admitted to ED on 04/07/17 with occipital headache, nausea/vomitting, and LOC leading to fall striking her head. 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 S/p coil embolization 4/29. 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 Pertinent PMH includes HTN, GERD.     PT Comments    Patient progressing slowly toward PT goals. Demonstrates improved trunk control and ataxia today but continues to require variable assist Min-Max A during mobility due to impulsivity, poor proprioception and awareness. Continues to have left hip pain. Requires Max tactile and verbal cues for gait training today using closed chair for UEs and assist to activate left hip stabilizers. Appropriate for CIR. Will follow.   Follow Up Recommendations  CIR;Supervision for mobility/OOB     Equipment Recommendations  Other (comment)    Recommendations for Other Services       Precautions / Restrictions Precautions Precautions: Fall Precaution Comments: EVD Restrictions Weight Bearing Restrictions: No    Mobility  Bed Mobility Overal bed mobility: Needs Assistance Bed Mobility: Supine to Sit     Supine to sit: Min guard;HOB elevated Sit to supine: Supervision   General bed mobility comments: supervision for safety due to impulsivity   Transfers Overall transfer level: Needs assistance Equipment used: 1 person hand held assist Transfers: Sit to/from [employer] Sit to Stand: Min assist;+2 safety/equipment Stand pivot transfers: Min assist;Mod assist       General transfer comment: with bil. hands on bil. Knees, worked on sit to stand with controlled trunk and balance.  She initially required min A, but progressed to mod A as she  fatigued.  Heavy lean to Lt as she fatigues    Ambulation/Gait Ambulation/Gait assistance: Mod assist;+2 safety/equipment Ambulation Distance (Feet): 10 Feet (+ 15') Assistive device: 2 person hand held assist Gait Pattern/deviations: Step-through pattern;Decreased stance time - left;Decreased step length - right;Narrow base of support;Ataxic;Leaning posteriorly;Staggering right;Staggering left Gait velocity: Decreased   General Gait Details: Pt with bil hands on shoulders of therapist, cues to decrease speed, assist with weight shifting to advance RLE; tactile and verbal cues to activate left glutes to stabilize during stance phase. posterior lean and lean to the left worsened with fatigue. 1 seated rest break.   Stairs            Wheelchair Mobility    Modified Rankin (Stroke Patients Only) Modified Rankin (Stroke Patients Only) Pre-Morbid Rankin Score: No symptoms Modified Rankin: Moderately severe disability     Balance Overall balance assessment: Needs assistance Sitting-balance support: Feet supported;No upper extremity supported Sitting balance-Leahy Scale: Poor Sitting balance - Comments: requires bil. UE support.  With UEs supported on bil. knees, she was able to maintain static EOC sitting with min guard assist - min A.  loses balance to Lt at times requiring physical assist to recover. Pt with poor proprioceptive awareness    Standing balance support: During functional activity;Single extremity supported;Bilateral upper extremity supported Standing balance-Leahy Scale: Poor Standing balance comment: With bil. UEs supported (closed chain), pt able to stand with min A - max A.  Status variable throughout session depending on fatigue level and distraction.  Pt with poor proprioceptive awareness which appears worse on the Lt  facilitation provided Lt scap/shoulder, Lt hip and trunk  Cognition Arousal/Alertness: Awake/alert Behavior  During Therapy: WFL for tasks assessed/performed Overall Cognitive Status: Impaired/Different from baseline Area of Impairment: Safety/judgement;Attention;Awareness                   Current Attention Level: Selective     Safety/Judgement: Decreased awareness of deficits;Decreased awareness of safety Awareness: Emergent   General Comments: Pt is very impulsive.  Decreased awareness of severity of impairments and impact on function       Exercises      General Comments        Pertinent Vitals/Pain Pain Assessment: Faces Faces Pain Scale: Hurts even more Pain Location: Lt hip  Pain Descriptors / Indicators: Aching;Grimacing;Guarding Pain Intervention(s): Monitored during session;Repositioned    Home Living                      Prior Function            PT Goals (current goals can now be found in the care plan section) Progress towards PT goals: Progressing toward goals    Frequency    Min 4X/week      PT Plan Current plan remains appropriate    Co-evaluation PT/OT/SLP Co-Evaluation/Treatment: Yes Reason for Co-Treatment: Complexity of the patient's impairments (multi-system involvement);For patient/therapist safety;To address functional/ADL transfers PT goals addressed during session: Mobility/safety with mobility OT goals addressed during session: ADL's and self-care      AM-PAC PT "6 Clicks" Daily Activity  Outcome Measure  Difficulty turning over in bed (including adjusting bedclothes, sheets and blankets)?: None Difficulty moving from lying on back to sitting on the side of the bed? : None Difficulty sitting down on and standing up from a chair with arms (e.g., wheelchair, bedside commode, etc,.)?: Total Help needed moving to and from a bed to chair (including a wheelchair)?: A Little Help needed walking in hospital room?: A Lot Help needed climbing 3-5 steps with a railing? : A Lot 6 Click Score: 16    End of Session Equipment  Utilized During Treatment: Gait belt Activity Tolerance: Patient tolerated treatment well Patient left: in bed;with call bell/phone within reach;with family/visitor present Nurse Communication: Mobility status PT Visit Diagnosis: Other abnormalities of gait and mobility (R26.89);Ataxic gait (R26.0) Pain - Right/Left: Left Pain - part of body: Hip     Time: [id number] PT Time Calculation (min) (ACUTE ONLY): 55 min  Charges:  $Gait Training: 8-22 mins $Neuromuscular Re-education: 8-22 mins                    G Codes:       Mylo RedShauna Caty Tessler, PT, DPT [phone]     Blake DivineShauna A Alexy Heldt 04/17/2017, 1:47 PM

## 2017-04-17 NOTE — Progress Notes (Signed)
I met with patient, her husband, PT and OT at bedside. We discussed briefly an inpt rehab admit when pt medically ready. I have begun insurance authorization. [phone]

## 2017-04-17 NOTE — Progress Notes (Signed)
Preliminary results by tech - Transcranial Doppler attempted. Non diagnostic at this time due to poor windows. [name] Barnett, BS, RDMS, RVT

## 2017-04-17 NOTE — Progress Notes (Signed)
Pt seen and examined. No issues overnight. Continues to work with therapy.  EXAM: Temp:  [97.7 F (36.5 C)-98.6 F (37 C)] 98.1 F (36.7 C) (05/09 1600) Pulse Rate:  [54-98] 59 (05/09 1700) Resp:  [12-21] 14 (05/09 1700) BP: (132-172)/(67-108) 149/83 (05/09 1700) SpO2:  [96 %-100 %] 96 % (05/09 1700) Intake/Output      05/09 0730 - 05/10 0729   P.O.    I.V. (mL/kg) 770 (11.7)   Total Intake(mL/kg) 770 (11.7)   Urine (mL/kg/hr) 2400 (3.6)   Drains 0 (0)   Total Output 2400   Net -1630        Awake, alert, oriented Speech fluent Left CN III, possible superior hemifield cut Good strength EVD in place, clear CSF open @ 15mmHg  LABS: Lab Results  Component Value Date   CREATININE 0.43 (L) 04/10/2017   BUN <5 (L) 04/10/2017   NA 140 04/10/2017   K 3.3 (L) 04/10/2017   CL 106 04/10/2017   CO2 27 04/10/2017   Lab Results  Component Value Date   WBC 12.7 (H) 04/10/2017   HGB 8.7 (L) 04/10/2017   HCT 29.4 (L) 04/10/2017   MCV 83.1 04/10/2017   PLT 318 04/10/2017   CT Head reviewed, catheter in good position, no HCP.   IMPRESSION: - 41 y.o. female SAH d#11 s/p basilar coiling, neurologically stable.  - Question whether field cut might be related to direct ocular trauma as this would not be explained by a cerebellar/SCA territory infarct  PLAN: - Will d/c EVD tomorrow, may be able to go to CIR later this week - Will get ophthalmology consult - Cont supportive care, nimotop, TCD

## 2017-04-17 NOTE — Progress Notes (Signed)
Occupational Therapy Treatment Patient Details Name: [name] Barnett MRN: [id number] DOB: [birth year] Today's Date: 04/17/2017    History of present illness Pt is a 41 y.o. female admitted to ED on 04/07/17 with occipital headache, nausea/vomitting, and LOC leading to fall striking her head. 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 S/p coil embolization 4/29. 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 Pertinent PMH includes HTN, GERD.    OT comments  Pt demonstrates improving trunk control and balance, but as she fatigues continues to require significant amount of assist. - min A - max A. She has poor righting reactions and poor proprioception.   Pt complaining of Lt hip pain.  Pt with decreased visual acuity Lt eye - only able to see outlines of objects in central field, but was ~95% accurate with  confrontation testing.  Spouse reports she fell on her eye.  Question if may benefit from ophthalmology consult.  Continue to recommend CIR.  Will continue to follow.   Follow Up Recommendations  CIR;Supervision/Assistance - 24 hour    Equipment Recommendations  None recommended by OT    Recommendations for Other Services Rehab consult    Precautions / Restrictions Precautions Precautions: Fall Precaution Comments: EVD       Mobility Bed Mobility Overal bed mobility: Needs Assistance         Sit to supine: Supervision   General bed mobility comments: supervision for safety due to impulsivity   Transfers Overall transfer level: Needs assistance Equipment used: 1 person hand held assist Transfers: Sit to/from Stand;Stand Pivot Transfers Sit to Stand: Min assist;+2 safety/equipment Stand pivot transfers: Min assist;Mod assist       General transfer comment: with bil. hands on bil. Knees, worked on sit to stand with controlled trunk and balance.  She initially required min A, but progressed to min  A as she fatigued.  Heavy lean to Lt as she fatigues      Balance Overall balance assessment: Needs assistance Sitting-balance support: Feet supported Sitting balance-Leahy Scale: Poor Sitting balance - Comments: requires bil. UE support.  With UEs supported on bil. knees, she was able to maintain static EOC sitting with min guard assist - min A.  loses balance to Lt at times requiring physical assist to recover. Pt with poor proprioceptive awareness    Standing balance support: During functional activity;Single extremity supported;Bilateral upper extremity supported Standing balance-Leahy Scale: Poor Standing balance comment: With bil. UEs supported (closed chain), pt able to stand with min A - max A.  Status variable throughout session depending on fatigue level and distraction.  Pt with poor proprioceptive awareness which appears worse on the Lt  facilitation provided Lt scap/shoulder, Lt hip and trunk                            ADL either performed or assessed with clinical judgement   ADL                           Toilet Transfer: Moderate assistance;+2 for physical assistance;+2 for safety/equipment Toilet Transfer Details (indicate cue type and reason): Pt initially requiring min A, but progressed to mod A as she fatigued          Functional mobility during ADLs: Moderate assistance;+2 for physical assistance;+2 for safety/equipment       Vision  Additional Comments: Pt with dysconjugate gaze.   She denies diplopia despite obvious malalignment of eyes.   With Lt eye patched, pt with poor acuity - only able to make out shadows and outlines of images.  She reports vision goes black in her superior fields, but was able to identify fingers with 95% accuracy during confrontation testing.   Spouse reports pt fell on her Lt eye.   Spoke with RN re: findings who in turn phoned MD    Perception     Praxis      Cognition Arousal/Alertness: Awake/alert Behavior  During Therapy: WFL for tasks assessed/performed Overall Cognitive Status: Impaired/Different from baseline Area of Impairment: Safety/judgement;Attention;Awareness                           Awareness: Emergent   General Comments: Pt is very impulsive.  Decreased awareness of severity of impairments and impact on function         Exercises     Shoulder Instructions       General Comments      Pertinent Vitals/ Pain       Pain Assessment: Faces Faces Pain Scale: Hurts even more Pain Location: Lt hip  Pain Descriptors / Indicators: Aching;Grimacing;Guarding Pain Intervention(s): Monitored during session;Repositioned  Home Living                                          Prior Functioning/Environment              Frequency  Min 2X/week        Progress Toward Goals  OT Goals(current goals can now be found in the care plan section)  Progress towards OT goals: Progressing toward goals     Plan Discharge plan remains appropriate    Co-evaluation    PT/OT/SLP Co-Evaluation/Treatment: Yes Reason for Co-Treatment: Complexity of the patient's impairments (multi-system involvement);For patient/therapist safety;To address functional/ADL transfers   OT goals addressed during session: ADL's and self-care      AM-PAC PT "6 Clicks" Daily Activity     Outcome Measure   Help from another person eating meals?: A Little Help from another person taking care of personal grooming?: A Lot Help from another person toileting, which includes using toliet, bedpan, or urinal?: A Lot Help from another person bathing (including washing, rinsing, drying)?: A Lot Help from another person to put on and taking off regular upper body clothing?: A Lot Help from another person to put on and taking off regular lower body clothing?: A Lot 6 Click Score: 13    End of Session Equipment Utilized During Treatment: Gait belt  OT Visit Diagnosis: Unsteadiness on  feet (R26.81);Low vision, both eyes (H54.2);Muscle weakness (generalized) (M62.81)   Activity Tolerance Patient tolerated treatment well   Patient Left in bed;with call bell/phone within reach;with family/visitor present   Nurse Communication Mobility status;Other (comment) (vision deficits )        Time: 1191-47821116-1212 OT Time Calculation (min): 56 min  Charges: OT General Charges $OT Visit: 1 Procedure OT Treatments $Neuromuscular Re-education: 23-37 mins  Reynolds AmericanWendi Jessic Standifer, OTR/L [phone]    Jeani HawkingConarpe, Birdie Fetty M 04/17/2017, 12:55 PM

## 2017-04-17 NOTE — Progress Notes (Signed)
Transcranial Doppler  Date POD PCO2 HCT BP  MCA ACA PCA OPHT SIPH VERT Basilar  4/30/ rds     Right  Left   73  75   -67  -57   50  47   52  26   46  77   -58  -40   -52      04/10/17 rds     Right  Left   66  146   -71  -109   41  51   17  25   83  70   -66  -60   -60      5/4 JE     Right  Left   83  90   -76  -66   43  44   33  43   89  101   -43  -51   -60      5/7/rds      Right  Left   140  114   -129  -109   27  46   26  18   62  36   -58  -37   -54      5/9rs      Right  Left   117  126   --69  -74   25  32   16  15   *  *   -59  -28   -49           Right  Left                                            Right  Left                                        MCA = Middle Cerebral Artery      OPHT = Opthalmic Artery     BASILAR = Basilar Artery   ACA = Anterior Cerebral Artery     SIPH = Carotid Siphon PCA = Posterior Cerebral Artery   VERT = Verterbral Artery                   Normal MCA = 62+\-12 ACA = 50+\-12 PCA = 42+\-23   04/08/17 - Lindegaard Ratio - Right 2.2, Left 2.4 rds 04/10/17 - Lindegaard Ratio - Right 2.0, Left 4.1 rds 5/4 Lindegaard ratio- right 1.6. Left 2.0. 04/15/17 - Lindegaard ratio - Right 5.2, Left 5.3, rds 04/17/17 - Lindegaard ratio - Right 4.0. Left 5.4, * not insonated, rds

## 2017-04-17 NOTE — Progress Notes (Signed)
  Speech Language Pathology Treatment: Dysphagia;Cognitive-Linquistic  Patient Details Name: [name] Barnett MRN: [id number] DOB: 02/25/1976 Today's Date: 04/17/2017 Time: [id number] SLP Time Calculation (min) (ACUTE ONLY): 13 min  Assessment / Plan / Recommendation Clinical Impression  Pt needs Mod cues for appropriate sequencing for adequate completion of chin tuck throughout breakfast meal. When she does not have sufficient coordination or she takes large, consecutive straw sips, she does still have some coughing with thin liquids. It is reduced with cueing from therapist. She continues to need full supervision for recall and implementation of strategies. Will continue to follow acutely - recommend CIR at d/c.   HPI HPI: 41 year old female admitted on 4/29 with acute aneurysmal SAH s/p coiling. 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 Pt initially passed the stroke swallow screen but was observed to be coughing with thin liquid intake.       SLP Plan  Continue with current plan of care       Recommendations  Diet recommendations: Regular;Thin liquid Liquids provided via: Cup;Straw Medication Administration: Whole meds with puree Supervision: Patient able to self feed;Full supervision/cueing for compensatory strategies Compensations: Slow rate;Small sips/bites;Chin tuck Postural Changes and/or Swallow Maneuvers: Seated upright 90 degrees                Oral Care Recommendations: Oral care BID Follow up Recommendations: Inpatient Rehab SLP Visit Diagnosis: Dysphagia, pharyngeal phase (R13.13);Cognitive communication deficit (Y78.295(R41.841) Plan: Continue with current plan of care       GO                Maxcine Hamaiewonsky, Russ Looper 04/17/2017, 12:15 PM  Maxcine HamLaura Paiewonsky, M.A. CCC-SLP [phone]

## 2017-04-17 NOTE — Plan of Care (Signed)
Problem: Pain Managment: Goal: General experience of comfort will improve Outcome: Progressing Patient reports PRN fioricet is improving her pain.

## 2017-04-18 MED ORDER — MORPHINE SULFATE (PF) 4 MG/ML IV SOLN
2.0000 mg | Freq: Once | INTRAVENOUS | Status: AC
  Administered 2017-04-18: 2 mg via INTRAVENOUS

## 2017-04-18 MED ORDER — MORPHINE SULFATE (PF) 4 MG/ML IV SOLN
INTRAVENOUS | Status: AC
  Administered 2017-04-18: 2 mg via INTRAVENOUS
  Filled 2017-04-18: qty 1

## 2017-04-18 NOTE — Progress Notes (Signed)
Pt seen and examined. May have fallen yesterday night while getting into bed. Worsened HA today.   EXAM: Temp:  [97.8 F (36.6 C)-98.2 F (36.8 C)] 98.2 F (36.8 C) (05/10 1151) Pulse Rate:  [57-98] 65 (05/10 1000) Resp:  [12-21] 15 (05/10 1000) BP: (130-177)/(60-88) 155/78 (05/10 1000) SpO2:  [95 %-99 %] 98 % (05/10 1000) Intake/Output      05/10 0730 - 05/11 0729   P.O. 240   I.V. (mL/kg) 225 (3.4)   Total Intake(mL/kg) 465 (7.1)   Urine (mL/kg/hr)    Drains 0 (0)   Total Output 0   Net +465        Awake, alert, oriented Speech fluent CN intact x left IIIrd MAE well  LABS: Lab Results  Component Value Date   CREATININE 0.43 (L) 04/10/2017   BUN <5 (L) 04/10/2017   NA 140 04/10/2017   K 3.3 (L) 04/10/2017   CL 106 04/10/2017   CO2 27 04/10/2017   Lab Results  Component Value Date   WBC 12.7 (H) 04/10/2017   HGB 8.7 (L) 04/10/2017   HCT 29.4 (L) 04/10/2017   MCV 83.1 04/10/2017   PLT 318 04/10/2017    IMPRESSION: - 41 y.o. female SAH d# 12 s/p basilar aneurysm coiling. Neurologically stable. Has had EVD clamped x2 days  PLAN: - EVD d/c'ed at bedside - Ophtho consult - If stable, can likely d/c to CIR tomorrow

## 2017-04-18 NOTE — Progress Notes (Signed)
Met with pt and spouse at bedside. Complaints of not feeling well today as yesterday. I will follow. [phone]

## 2017-04-18 NOTE — H&P (Signed)
Physical Medicine and Rehabilitation Admission H&P    Chief Complaint  Patient presents with  . SAH    Due to aneurysmal bleed    HPI:  [name] Barnett is a 41 y.o. female with history of HTN, GERD who was admitted on 04/07/17 with worsening of HA, episode of emesis followed by LOC with fall and LLE weakness. 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 She was taken to OR by Dr. Conchita ParisNundkumar and underwent coil embolization of basilar artery aneurysm.[name]. 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 EVD placed at bedside with improvement in LOC and IS used to help improve low lung volumes. Patient with resultant balance deficits, left gaze palsy with decreased vision Left > Right as well as persistent headaches .  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 EVD removed 5/10  MBS done due to reports of coughing with liquids and chin tuck recommended with liquids to prevent aspiration. She has had reports of changes in vision and opthalmology evaluation showed evidence of bilateral preretinal/vitreous hemorrhage associated with SAH. Dr. Genia DelMincey indicates that vitreous hemorrhage should resolve over next several weeks and no signs of retinal tear or detachment--call for worsening of vision/   Therapy ongoing and patient with ataxic gait with balance deficits, tendency to lean to the left, decrease in visual acuity as well as proprioceptive deficits. CIR recommended due to significant deficits in mobility and ability to perform ADL tasks.    Review of Systems  Constitutional: Negative for fever.  HENT: Negative for hearing loss.   Eyes: Positive for blurred vision and double vision.  Respiratory:  Negative for cough.   Cardiovascular: Negative for chest pain.  Gastrointestinal: Positive for nausea.  Genitourinary: Negative for dysuria.  Musculoskeletal: Negative for myalgias.  Skin: Negative for rash.  Neurological: Positive for dizziness, focal weakness and headaches.  Psychiatric/Behavioral: Negative for depression.      Past Medical History:  Diagnosis Date  . Duodenal obstruction   . GERD (gastroesophageal reflux disease)   . Hypertension     Past Surgical History:  Procedure Laterality Date  . BALLOON DILATION  12/31/2011   Procedure: BALLOON DILATION;  Surgeon: Barrie FolkJohn C Hayes, MD;  [facility];  Service: Endoscopy;  Laterality: N/A;  . CHOLECYSTECTOMY  07/28/11  . ESOPHAGOGASTRODUODENOSCOPY  10/17/2011   Procedure: ESOPHAGOGASTRODUODENOSCOPY (EGD);  Surgeon: Barrie FolkJohn C Hayes, MD;  [facility];  Service: Endoscopy;  Laterality: N/A;  . RADIOLOGY WITH ANESTHESIA N/A 04/07/2017   Procedure: RADIOLOGY WITH ANESTHESIA;  Surgeon: [name], MD;  Location: [facility] OR;  Service: Radiology;  Laterality: N/A;  . TEMPOROMANDIBULAR JOINT SURGERY     2 surgeries  . VAGOTOMY  01/23/2012   Procedure: VAGOTOMY, antrectomy and BII;  Surgeon: [name], MD;  [facility];  Service: General;  Laterality: N/A;  Laparotomy with vagotomy.    Family History  Problem Relation Age of Onset  . Malignant hyperthermia Neg Hx     Social History:  Married. Husband works nights for [employer]. Works in an office. She reports that she has been smoking Cigarettes  1- 1.5 PPD depending on stress.  She has a 20.00 pack-year smoking history. She has never used smokeless tobacco. She drinks 10 mixed drinks (  [employer]) on the weekends. She does not use drugs.    Allergies  Allergen Reactions  . 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 ibuprofen (ADVIL,MOTRIN) 200  MG tablet Take 200 mg by mouth every 6 (six) hours as needed for moderate pain.    . feeding supplement (RESOURCE BREEZE) LIQD Take 1 Container by mouth 2 (two) times daily with a meal. (Patient not taking: Reported on 04/07/2017)    . pantoprazole (PROTONIX) 40 MG tablet Take 1 tablet (40 mg total) by mouth 2 (two) times daily before a meal. 60 tablet 1    Home: Home Living Family/patient expects to be discharged to:: Private residence Living Arrangements: Spouse/significant other, Children Available Help at Discharge: Family, Available 24 hours/day Type of Home: House Home Access: Stairs to enter [employer] of Steps: 3 Entrance Stairs-Rails: Can reach both Home Layout: One level Bathroom Shower/Tub: Health visitor: Standard Bathroom Accessibility: Yes Home Equipment: Crutches Additional Comments: Husband works night; mother and mother-in-law live nearby and are able to help if needed.  Lives With: Spouse, Daughter (43 yo daughter and 86 yo son)   Functional History: Prior Function Level of Independence: Independent Comments: Works as Airline pilot; Armed forces training and education officer Status:  Mobility: Bed Mobility Overal bed mobility: Needs Assistance Bed Mobility: Supine to Sit Supine to sit: Min guard, HOB elevated Sit to supine: Supervision General bed mobility comments: supervision for safety due to impulsivity  Transfers Overall transfer level: Needs assistance Equipment used: 1 person hand held assist Transfers: Sit to/from Stand, Stand Pivot Transfers Sit to Stand: Min assist, +2 safety/equipment Stand pivot transfers: Min assist, Mod assist General transfer comment: with bil. hands on bil. Knees, worked on sit to stand with controlled trunk and balance.  She initially required min A, but progressed to mod A as she fatigued.  Heavy lean to Lt as she fatigues   Ambulation/Gait Ambulation/Gait assistance: Mod assist, +2 safety/equipment Ambulation Distance  (Feet): 10 Feet (+ 15') Assistive device: 2 person hand held assist Gait Pattern/deviations: Step-through pattern, Decreased stance time - left, Decreased step length - right, Narrow base of support, Ataxic, Leaning posteriorly, Staggering right, Staggering left General Gait Details: Pt with bil hands on shoulders of therapist, cues to decrease speed, assist with weight shifting to advance RLE; tactile and verbal cues to activate left glutes to stabilize during stance phase. posterior lean and lean to the left worsened with fatigue. 1 seated rest break. Gait velocity: Decreased Gait velocity interpretation: Below normal speed for age/gender    ADL: ADL Overall ADL's : Needs assistance/impaired Eating/Feeding: Set up (sitting up in bed) Grooming: Minimal assistance Grooming Details (indicate cue type and reason): Sitting up in bed; Dizziness impacting ability to sit without back support. Upper Body Bathing: Minimal assistance, Sitting (with back support) Lower Body Bathing: Maximal assistance, Sit to/from stand Lower Body Bathing Details (indicate cue type and reason): Due to dizziness Upper Body Dressing : Minimal assistance, Sitting (with back support) Lower Body Dressing: Supervision/safety, Sitting/lateral leans Lower Body Dressing Details (indicate cue type and reason): able to don socks sitting in bed Toilet Transfer: Moderate assistance, +2 for physical assistance, +2 for safety/equipment Toilet Transfer Details (indicate cue type and reason): Pt initially requiring min A, but progressed to mod A as she fatigued  Toileting- Clothing Manipulation and Hygiene: Maximal assistance, Sit to/from stand Functional mobility during ADLs: Moderate assistance, +2  for physical assistance, +2 for safety/equipment General ADL Comments: L UE ataxic with movement and pt with poor spatial awareness and depth perception impacting function with L UE reaching.  Cognition: Cognition Overall Cognitive  Status: Impaired/Different from baseline Arousal/Alertness: Awake/alert Orientation Level: Oriented X4 Attention: Alternating Alternating Attention: Impaired Alternating Attention Impairment: Functional basic, Verbal basic Memory: Impaired Memory Impairment: Decreased recall of new information, Retrieval deficit Awareness: Impaired Awareness Impairment: Anticipatory impairment Problem Solving: Appears intact Safety/Judgment: Impaired Cognition Arousal/Alertness: Awake/alert Behavior During Therapy: WFL for tasks assessed/performed Overall Cognitive Status: Impaired/Different from baseline Area of Impairment: Safety/judgement, Attention, Awareness Current Attention Level: Selective Safety/Judgement: Decreased awareness of deficits, Decreased awareness of safety Awareness: Emergent Problem Solving: Requires verbal cues, Requires tactile cues General Comments: Pt is very impulsive.  Decreased awareness of severity of impairments and impact on function    Blood pressure 131/66, pulse 65, temperature 97.8 F (36.6 C), temperature source Oral, resp. rate 12, height 5\' 4"  (1.626 m), weight 65.8 kg (145 lb 1 oz), SpO2 97 %. Physical Exam  Constitutional: She appears well-developed. No distress.  HENT:  Mouth/Throat: Oropharynx is clear and moist.  Eyes:  Left peri-orbital ecchymosis   Neck: No tracheal deviation present. No thyromegaly present.  Cardiovascular: Normal rate.  Exam reveals no gallop and no friction rub.   No murmur heard. Respiratory: No respiratory distress. She has no wheezes. She has no rales.  GI: Soft. She exhibits no distension. There is no tenderness.  Neurological:  Right  facial weakness is persistent. Speech clear. Horizontal nystagmus with visual deficits noted in superior and inferior visual field. Left lid ptosis.  Has difficulty with horizontal tracking as well as superiorly. Ataxia right upper and lower extremity. Right pronator drift. Strength 4/5 RUE  prox to distal and RLE prox to distal. 4+ LUE and LLE. Fair insight and awareness. Follows commands. Provides biographical information.  Psychiatric: She has a normal mood and affect. Her behavior is normal.  A little disinhibited    No results found for this or any previous visit (from the past 48 hour(s)). No results found.     Medical Problem List and Plan: 1.  Functional and mobility deficits secondary to basilar artery aneurysm and subsequent SAH.   -admit to inpatient rehab 2.  DVT Prophylaxis/Anticoagulation: Mechanical: Sequential compression devices, below knee Bilateral lower extremities 3. Headaches/Pain Management: Fioriect  Prn ineffective.Will start on topamax and titrate as indicated.   4. Mood: LCSW to follow for evaluation and support.  5. Neuropsych: This patient is capable of making decisions on her own behalf. 6. Skin/Wound Care: Routine pressure relief measures.  7. Fluids/Electrolytes/Nutrition: Monitor I/O. Offer supplements between meals.  8. HTN: blood pressures continue to be elevated despite nimotop on board--question pain mediated. Will order prn clonidine with set parameters.  9. 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 Hypokalemia: Encourage po intake. Recheck labs in am.  11. ABLA: Will recheck in am.  12. Leucocytosis: Improving--Likely due to steroids. Monitor for signs of infection. Started on dose pack for taper.  13. Constipation: Augment bowel program.      Post Admission Physician Evaluation: 1. Functional deficits secondary  to [facility]. 2. Patient is admitted to receive collaborative, interdisciplinary care between the physiatrist, rehab nursing staff, and therapy team. 3. Patient's level of medical complexity and substantial therapy  needs  in context of that medical necessity cannot be provided at a lesser intensity of care such as a SNF. 4. Patient has experienced substantial functional loss from his/her baseline which was documented above under the "Functional History" and "Functional Status" headings.  Judging by the patient's diagnosis, physical exam, and functional history, the patient has potential for functional progress which will result in measurable gains while on inpatient rehab.  These gains will be of substantial and practical use upon discharge  in facilitating mobility and self-care at the household level. 5. Physiatrist will provide 24 hour management of medical needs as well as oversight of the therapy plan/treatment and provide guidance as appropriate regarding the interaction of the two. 6. The Preadmission Screening has been reviewed and patient status is unchanged unless otherwise stated above. 7. 24 hour rehab nursing will assist with bladder management, bowel management, safety, skin/wound care, disease management, medication administration, pain management and patient education  and help integrate therapy concepts, techniques,education, etc. 8. PT will assess and treat for/with: Lower extremity strength, range of motion, stamina, balance, functional mobility, safety, adaptive techniques and equipment, NMR, visual-spatial awareness, vestibular sx mgt, pain control.   Goals are: supervision to mod I. 9. OT will assess and treat for/with: ADL's, functional mobility, safety, upper extremity strength, adaptive techniques and equipment, NMR, visual-spatial awareness, pain mgt, family education, ego support.   Goals are: mod I to min assist. Therapy may proceed with showering this patient. 10. SLP will assess and treat for/with: cognition, communication, family education.  Goals are: supervision to mod I. 11. Case Management and Social Worker will assess and treat for psychological issues and discharge  planning. 12. Team conference will be held weekly to assess progress toward goals and to determine barriers to discharge. 13. Patient will receive at least 3 hours of therapy per day at least 5 days per week. 14. ELOS: 10-16 days       15. Prognosis:  excellent     Ranelle Oyster, MD, [facility] Physical Medicine & Rehabilitation 04/19/2017  Jacquelynn Cree, Cordelia Poche 04/19/2017

## 2017-04-18 NOTE — Consult Note (Signed)
Chief Complaint  Patient presents with  . Code Stroke    Code Stroke   :       Ophthalmology HPI: This is a 41 y.o.  female with a past ocular history listed below that presents with visual field defect following coiling for basilar artery aneurysm and subarrachnoid hemorrhage. Patient initially present4/29 with headache, neck pain,, an dnausea. She had LOC and ground level fall with left periorbital ecchymosis.  She underwent choiling o fher anuerysm 4/29. She has had persistent diplopia since then resolved with patching.  She has some tenderness around the left eye. She states she sees "blood drops floating in the back of the left eye.      Past Ocular History:  Refractive Error    Last Eye Exam: 1 year ago    Primary Eye Care: [facility]   Past Medical History:  Diagnosis Date  . Duodenal obstruction   . GERD (gastroesophageal reflux disease)   . Hypertension      Past Surgical History:  Procedure Laterality Date  . BALLOON DILATION  12/31/2011   Procedure: BALLOON DILATION;  Surgeon: Barrie Folk, MD;  [facility] END[facility]COPY;  Service: Endoscopy;  Laterality: N/A;  . CHOLECYSTECTOMY  07/28/11  . ESOPHAGOGASTRODUODENOSCOPY  10/17/2011   Procedure: ESOPHAGOGASTRODUODENOSCOPY (EGD);  Surgeon: Barrie Folk, MD;  [facility];  Service: Endoscopy;  Laterality: N/A;  . RADIOLOGY WITH ANESTHESIA N/A 04/07/2017   Procedure: RADIOLOGY WITH ANESTHESIA;  Surgeon: Lisbeth Renshaw, MD;  Location: [facility] OR;  Service: Radiology;  Laterality: N/A;  . TEMPOROMANDIBULAR JOINT SURGERY     2 surgeries  . VAGOTOMY  01/23/2012   Procedure: VAGOTOMY, antrectomy and BII;  Surgeon: Currie Paris, MD;  Location: [facility] OR;  Service: General;  Laterality: N/A;  Laparotomy with vagotomy.     Social History   Social History  . Marital status: Married    Spouse name: N/A  . Number of children: N/A  . Years of education: N/A   Occupational History  . Not on file.    Social History Main Topics  . Smoking status: Current Every Day Smoker    Packs/day: 1.00    Years: 20.00    Types: Cigarettes    Last attempt to quit: 10/17/2011  . Smokeless tobacco: Never Used  . Alcohol use No  . Drug use: No  . Sexual activity: Not on file   Other Topics Concern  . Not on file   Social History Narrative   Lives in [place] with husband and 2 kids. Works as a Psychologist, prison and probation services.     Allergies  Allergen Reactions  . 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    Current Outpatient Prescriptions on File Prior to Encounter  Medication Sig Dispense Refill  . feeding supplement (RESOURCE BREEZE) LIQD Take 1 Container by mouth 2 (two) times daily with a meal. (Patient not taking: Reported on 04/07/2017)    . pantoprazole (PROTONIX) 40 MG tablet Take 1 tablet (40 mg total) by mouth 2 (two) times daily before a meal. 60 tablet 1     Review of Systems  HENT: Negative.   Eyes: Positive for blurred vision, double vision and pain.  Respiratory: Negative.   Cardiovascular: Negative.   Gastrointestinal: Negative.   Genitourinary: Negative.   Musculoskeletal: Negative.   Skin: Negative.   Neurological: Positive for dizziness and weakness.  Endo/Heme/Allergies: Negative.   Psychiatric/Behavioral: Negative.       Exam:  General: Awake, Alert, Oriented *3  Vision (near): without correction   OD: 5 point (20/30)  OS: 10 point (20/70)  Confrontational Field:   Full to count fingers, both eyes  Extraocular Motility:  Large adduction deficit left eye  [place]ox:   Exo increasing on right gaze, Left hyper on down gaze, Left hypo on up gaze.    External:   Left periorbital ecchymosis, V1-3 intact, infraorbital nerve appears intact.   Hertel:   16/16 (102)  Pupils  OD: 4mm to 3mm reactive without afferent pupillary defect (APD)  OS:  4mm to 3mm reactive without afferent pupillary defect (APD)   IOP(tactile)  OD: Soft  OS: Soft  Slit Lamp Exam:  Lids/Lashes  OD: Normal Lids and lashes, No lesion or injury  OS: periorbital ecchymosis  Conjucntiva/Sclera  OD: White and quiet  OS: White and quiet  Cornea  OD: Clear without abrasion or defect  OS: Clear without abrasion or defect  Anterior Chamber  OD: Deep and quiet  OS: Deep and quiet  Iris  OD: Normal iris architecture  OS: Normal Iris Architecture   Lens  OD: Clear, Without significant opacities  OS: Clear, Without significant opacities  Anterior Vitreous  OD: Clear, without cell  OS: No obvious cell    POSTERIOR POLE EXAM (Dialated with phenylephrine and tropicamide.Dilation may last up to 24 hours)  View:   OD: 20/20 view without opacities  OS: 20/40 view  Vitreous:   OD: Clear, no cell  OS: Vit hemorrhage, inferior obsercuration.   Disc:   OD: flat, sharp margin, with appropriate color  OS: flat, sharp margin, with appropriate color  C:D Ratio:   OD: 0.2   OS: 0.2  Macula  OD: Flat, with appropriate light reflex  OS: Hemorrhage   Vessels  OD: Normal vasculature  OS: Normal vasculature  Periphery  OD: pre retinal hemorrhage, multiple dbh  OS: Vitreous hemorrhage, preretinal hermorrhage. No visible retinal tear or retinal detachment.      Assessment and Plan:   This is 41 y.o.  female with SAH, basilar artery aneurysm s/p coiling, left third nerve palsy, and Terson Syndrome OU    Left Third Nerve Palsy -  Discussed diagnosis, prognosis and treatment options.  -  Recommend observation for now.  -  Patient patching either eye to eliminate double and relieve symptoms for now.  -  Follow up with outpatient ophthalmology within 2 weeks of discharge for further evaluation and management.    Terson Syndrome - Bilateral preretinal hemorrhage/vitreous hemorrhage associated with subarachnoid hemmorhage.  - No signs of  retinal tear or detachment on exam.  - Vitreous hemorrhage should resolve over the next several weeks. Unlikley to cause lasting vision loss. Counseled patient - If worsening field loss or worsening vision, recommend calling ophthalmology for repeat evaluation.    Follow up in 1-2 weeks from discharge with ophthalmology.    Mack HookAndrew Alani Lacivita, M.D.  [facility] [address] [place] [address] [phone](o) [phone] ([phone]) [phone]

## 2017-04-18 NOTE — PMR Pre-admission (Signed)
PMR Admission Coordinator Pre-Admission Assessment  Patient: [name] Barnett is an 41 y.o., female MRN: [id number] DOB: 05/09/1976 Height: 5\' 4"  (162.6 cm) Weight: 65.8 kg (145 lb 1 oz)              Insurance Information HMO:     PPO: yes     PCP:      IPA:      80/20:      OTHER:  PRIMARY: [employer] [place]      Policy#: [id number]      Subscriber: pt CM Name: Hasus     Phone#: [phone]     Fax#: [phone] Pre-Cert#: [id number]  Update needed in 7 days from admit    Employer: [employer] Benefits:  Phone #: [phone]     Name: 04/17/17 Eff. 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 She was taken to OR by Dr. 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 EVD placed at bedside with improvement in LOC and IS used to help improve low lung volumes. Patient  with resultant balance deficits, left gaze palsy with decreased vision  Left >Right as well as headaches . EVD clamped today and CT head to be repeated tomorrow. MBS done due to reports of coughing with liquids and chin tuck recommended with liquids to prevent aspiration. Therapy ongoing and patient with ataxic gait with balance deficits, tendency to lean to the left, decrease in visual acuity as well as proprioceptive deficits.   Total: 3 NIH    Past Medical History  Past Medical History:  Diagnosis Date  . Duodenal obstruction   . GERD (gastroesophageal reflux disease)   . Hypertension     Family History  family history is not on file.  Prior Rehab/Hospitalizations:  Has the patient had major surgery during 100 days prior to admission? No  Current Medications   Current Facility-Administered Medications:  .   stroke: mapping our early stages of recovery book, , Does not apply, Once, Costella, Vincent J, PA-C .  0.9 %  sodium chloride infusion, , Intravenous, Continuous, Nundkumar, Marlane Hatcher, MD, Last Rate: 75 mL/hr at 04/19/17 1000 .  0.9 %  sodium chloride infusion, , Intravenous, Once, Claudina Lick, CRNA .  acetaminophen (TYLENOL) tablet 650 mg, 650 mg, Oral, Q4H PRN, 650 mg at 04/19/17 1241 **OR** acetaminophen (TYLENOL) solution 650 mg, 650 mg, Per Tube, Q4H PRN **OR** acetaminophen (TYLENOL) suppository 650 mg, 650 mg, Rectal, Q4H PRN, Costella, Darci Current, PA-C .  butalbital-acetaminophen-caffeine (FIORICET, ESGIC) 50-325-40 MG per tablet 1-2 tablet, 1-2 tablet, Oral, Q6H PRN, Lisbeth Renshaw, MD, 1 tablet at 04/19/17 0933 .  Chlorhexidine Gluconate Cloth 2 % PADS 6 each, 6 each, Topical, Daily, Lisbeth Renshaw, MD, 6 each at 04/18/17 1656 .  dexamethasone (DECADRON) injection 4 mg, 4 mg, Intravenous, Q6H, Lisbeth Renshaw, MD, 4 mg at 04/19/17 1149 .  docusate sodium (COLACE) capsule 100 mg, 100 mg, Oral, BID, Costella, Vincent J, PA-C, 100 mg at 04/19/17 0933 .   labetalol (NORMODYNE,TRANDATE) injection 10 mg, 10 mg, Intravenous, Q2H PRN, Nundkumar, Neelesh, MD .  lidocaine (LIDODERM) 5 % 1 patch, 1 patch, Transdermal, Q24H, Alyson Reedy, MD, 1 patch at 04/19/17 1154 .  nicardipine (CARDENE) 20mg  in 0.86% saline IV infusion (0.1 mg/ml), 3-15 mg/hr, Intravenous, Continuous, Lisbeth Renshaw, MD, Stopped at 04/08/17 1855 .  niMODipine (NIMOTOP) capsule 60 mg, 60 mg, Oral, Q4H, 60 mg at 04/19/17 1149 **OR** NiMODipine (NYMALIZE) 60 MG/20ML oral solution 60 mg, 60 mg, Per Tube, Q4H, Costella, Vincent J, PA-C, 60 mg at 04/08/17 0935 .  ondansetron (ZOFRAN-ODT) disintegrating tablet 4 mg, 4 mg, Oral, Q6H PRN **OR** ondansetron (ZOFRAN) injection 4 mg, 4 mg, Intravenous, Q6H PRN, Costella, Vincent J, PA-C, 4 mg at 04/07/17 1820 .  pantoprazole (PROTONIX) EC tablet 40 mg, 40 mg, Oral, Daily, 40 mg at 04/19/17 0933 **OR** pantoprazole sodium (PROTONIX) 40 mg/20 mL oral suspension 40 mg, 40 mg, Per Tube, Daily, Costella, Vincent J, PA-C .  RESOURCE THICKENUP CLEAR, , Oral, PRN, Lisbeth Renshaw, MD .  sodium chloride flush (NS) 0.9 % injection 10-40 mL, 10-40 mL, Intracatheter, Q12H, Lisbeth Renshaw, MD, 20 mL at 04/19/17 0800 .  sodium chloride flush (NS) 0.9 % injection 10-40 mL, 10-40 mL, Intracatheter, PRN, Lisbeth Renshaw, MD, 20 mL at 04/19/17 1610  Patients Current Diet: Diet regular Room service appropriate? Yes; Fluid consistency: Thin Diet - low sodium heart healthy  Precautions / Restrictions Precautions Precautions: Fall Precaution Comments: EVD Restrictions Weight Bearing Restrictions: No   Has the patient had 2 or more falls or a fall with injury in the past year?No  Prior Activity Level Community (5-7x/wk): driving and working for [employer] / Equipment Home Assistive Devices/Equipment: None Home Equipment: Crutches  Prior Device Use: Indicate devices/aids used by the patient prior to current  illness, exacerbation or injury? None of the above  Prior Functional Level Prior Function Level of Independence: Independent Comments: Works as Airline pilot; drives  Self Care: Did the patient need help bathing, dressing, using the toilet or eating?  Independent  Indoor Mobility: Did the patient need assistance with walking from room to room (with or without device)? Independent  Stairs: Did the patient need assistance with internal or external stairs (with or without device)? Independent  Functional Cognition: Did the patient need help planning regular tasks such as shopping or remembering to take medications? Independent  Current Functional Level Cognition  Arousal/Alertness: Awake/alert Overall Cognitive Status: Impaired/Different from baseline Current Attention Level: Selective Orientation Level: Oriented X4 Safety/Judgement: Decreased awareness of deficits, Decreased awareness of safety General Comments: Pt is very impulsive.  Decreased awareness of severity of impairments and impact on function  Attention: Alternating Alternating Attention: Impaired Alternating Attention  Impairment: Functional basic, Verbal basic Memory: Impaired Memory Impairment: Decreased recall of new information, Retrieval deficit Awareness: Impaired Awareness Impairment: Anticipatory impairment Problem Solving: Appears intact Safety/Judgment: Impaired    Extremity Assessment (includes Sensation/Coordination)  Upper Extremity Assessment: Generalized weakness  Lower Extremity Assessment: Generalized weakness    ADLs  Overall ADL's : Needs assistance/impaired Eating/Feeding: Set up (sitting up in bed) Grooming: Minimal assistance Grooming Details (indicate cue type and reason): Sitting up in bed; Dizziness impacting ability to sit without back support. Upper Body Bathing: Minimal assistance, Sitting (with back support) Lower Body Bathing: Maximal assistance, Sit to/from stand Lower Body Bathing  Details (indicate cue type and reason): Due to dizziness Upper Body Dressing : Minimal assistance, Sitting (with back support) Lower Body Dressing: Supervision/safety, Sitting/lateral leans Lower Body Dressing Details (indicate cue type and reason): able to don socks sitting in bed Toilet Transfer: Moderate assistance, +2 for physical assistance, +2 for safety/equipment Toilet Transfer Details (indicate cue type and reason): Pt initially requiring min A, but progressed to mod A as she fatigued  Toileting- Clothing Manipulation and Hygiene: Maximal assistance, Sit to/from stand Functional mobility during ADLs: Moderate assistance, +2 for physical assistance, +2 for safety/equipment General ADL Comments: L UE ataxic with movement and pt with poor spatial awareness and depth perception impacting function with L UE reaching.    Mobility  Overal bed mobility: Needs Assistance Bed Mobility: Supine to Sit Supine to sit: Min guard, HOB elevated Sit to supine: Supervision General bed mobility comments: supervision for safety due to impulsivity     Transfers  Overall transfer level: Needs assistance Equipment used: 1 person hand held assist Transfers: Sit to/from Stand, Stand Pivot Transfers Sit to Stand: Min assist, +2 safety/equipment Stand pivot transfers: Min assist, Mod assist General transfer comment: with bil. hands on bil. Knees, worked on sit to stand with controlled trunk and balance.  She initially required min A, but progressed to mod A as she fatigued.  Heavy lean to Lt as she fatigues      Ambulation / Gait / Stairs / Wheelchair Mobility  Ambulation/Gait Ambulation/Gait assistance: Mod assist, +2 safety/equipment Ambulation Distance (Feet): 10 Feet (+ 15') Assistive device: 2 person hand held assist Gait Pattern/deviations: Step-through pattern, Decreased stance time - left, Decreased step length - right, Narrow base of support, Ataxic, Leaning posteriorly, Staggering right,  Staggering left General Gait Details: Pt with bil hands on shoulders of therapist, cues to decrease speed, assist with weight shifting to advance RLE; tactile and verbal cues to activate left glutes to stabilize during stance phase. posterior lean and lean to the left worsened with fatigue. 1 seated rest break. Gait velocity: Decreased Gait velocity interpretation: Below normal speed for age/gender    Posture / Balance Dynamic Sitting Balance Sitting balance - Comments: requires bil. UE support.  With UEs supported on bil. knees, she was able to maintain static EOC sitting with min guard assist - min A.  loses balance to Lt at times requiring physical assist to recover. Pt with poor proprioceptive awareness  Balance Overall balance assessment: Needs assistance Sitting-balance support: Feet supported, No upper extremity supported Sitting balance-Leahy Scale: Poor Sitting balance - Comments: requires bil. UE support.  With UEs supported on bil. knees, she was able to maintain static EOC sitting with min guard assist - min A.  loses balance to Lt at times requiring physical assist to recover. Pt with poor proprioceptive awareness  Standing balance support: During functional activity, Single extremity supported, Bilateral upper extremity supported  Standing balance-Leahy Scale: Poor Standing balance comment: With bil. UEs supported (closed chain), pt able to stand with min A - max A.  Status variable throughout session depending on fatigue level and distraction.  Pt with poor proprioceptive awareness which appears worse on the Lt  facilitation provided Lt scap/shoulder, Lt hip and trunk     Special needs/care consideration BiPAP/CPAP  N/a CPM  N/a Continuous Drip IV  N/a Dialysis  N/a Life Vest  N/a Oxygen N/a Special Bed  N/a Trach Size  N/a Wound Vac (area)  N/a Skin ventric site, ecchymosis left arm and eye and chest                              Bowel mgmt: LBM 4/29  Bladder mgmt: indwelling  catheter Diabetic mgmt  N/a   Previous Home Environment Living Arrangements: Spouse/significant other, Children  Lives With: Spouse, Daughter 60(12 yo daughter and 42 yo son) Available Help at Discharge: Family, Available 24 hours/day Type of Home: House Home Layout: One level Home Access: Stairs to enter Entrance Stairs-Rails: Can reach both Entrance Stairs-Number of Steps: 3 Bathroom Shower/Tub: Health visitorWalk-in shower Bathroom Toilet: Standard Bathroom Accessibility: Yes How Accessible: Accessible via walker Home Care Services: No Additional Comments: Husband works night; mother and mother-in-law live nearby and are able to help if needed.  Discharge Living Setting Plans for Discharge Living Setting: Patient's home, Lives with (comment) (spouse and 42 yo daughter; 42 yo son does not live with them) Type of Home at Discharge: House Discharge Home Layout: One level Discharge Home Access: Stairs to enter Entrance Stairs-Rails: Right, Left, Can reach both Entrance Stairs-Number of Steps: 3 Discharge Bathroom Shower/Tub: Walk-in shower Discharge Bathroom Toilet: Standard Discharge Bathroom Accessibility: Yes How Accessible: Accessible via walker Does the patient have any problems obtaining your medications?: No  Social/Family/Support Systems Patient Roles: Spouse, Parent (employee) Contact Information: Financial traderTimothy, spouse Anticipated Caregiver: spouse, Mom and mother in law Anticipated Caregiver's Contact Information: see above Ability/Limitations of Caregiver: spouse works part time nights for [employer] Caregiver Availability: 24/7 Discharge Plan Discussed with Primary Caregiver: Yes Is Caregiver In Agreement with Plan?: Yes Does Caregiver/Family have Issues with Lodging/Transportation while Pt is in Rehab?: No  Goals/Additional Needs Patient/Family Goal for Rehab: Mod I with PT, OT, and SLP Expected length of stay: ELOS 9- 12 days Pt/Family Agrees to Admission and willing to participate:  Yes Program Orientation Provided & Reviewed with Pt/Caregiver Including Roles  & Responsibilities: Yes  Decrease burden of Care through IP rehab admission: n/a  Possible need for SNF placement upon discharge:not anticipated  Patient Condition: This patient's medical and functional status has changed since the consult dated: 04/16/2017 in which the Rehabilitation Physician determined and documented that the patient's condition is appropriate for intensive rehabilitative care in an inpatient rehabilitation facility. See "History of Present Illness" (above) for medical update. Functional changes are: Currently requiring mod assist +2 to ambulate 10 feet +2 HHA. Patient's medical and functional status update has been discussed with the Rehabilitation physician and patient remains appropriate for inpatient rehabilitation. Will admit to inpatient rehab today.  Preadmission Screen Completed By:  Clois DupesBoyette, Barbara Godwin, 04/19/2017 12:45 PM with updates by Lelon FrohlichEugenia Surah Pelley 04/19/17 ______________________________________________________________________   Discussed status with Dr. Riley KillSwartz on 04/19/17 at 1244 and received telephone approval for admission today.  Admission Coordinator:  Trish MageLogue, Alfonso Carden M, time 1245/Date 04/19/17

## 2017-04-18 NOTE — Progress Notes (Signed)
Occupational Therapy Treatment Patient Details Name: [name] Barnett MRN: [id number] DOB: 08/10/1976 Today's Date: 04/18/2017    History of present illness Pt is a 41 y.o. female admitted to ED on 04/07/17 with occipital headache, nausea/vomitting, and LOC leading to fall striking her head. 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 S/p coil embolization 4/29. 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 Pertinent PMH includes HTN, GERD.    OT comments  Pt limited by severe headache today.  Soft tissue mobilization and massage performed bil. Shoulders due to increased tightness and limited neck ROM which was making headache more intense.  Subjective reduction in neck/shoulder tightness and pain.  She was unable to progress to OOB due to pain.  Will continue to follow.   Follow Up Recommendations  CIR;Supervision/Assistance - 24 hour    Equipment Recommendations  None recommended by OT    Recommendations for Other Services Rehab consult    Precautions / Restrictions Precautions Precautions: Fall       Mobility Bed Mobility                  Transfers                      Balance                                           ADL either performed or assessed with clinical judgement   ADL                                               Vision       Perception     Praxis      Cognition Arousal/Alertness: Awake/alert Behavior During Therapy: WFL for tasks assessed/performed Overall Cognitive Status: Impaired/Different from baseline Area of Impairment: Safety/judgement;Attention;Awareness                                        Exercises Exercises: Other exercises Other Exercises Other Exercises: Pt with complaint of 9-10/10 HA.  Reports signficant tightness bil. shoulders after a near fall last pm.   Soft tissue  mobilization and massage bil. levator, scalenes, and upper traps.  Pt reports subjective reduction in neck tightness, but reports no change in headache pain.      Shoulder Instructions       General Comments Pt reports headache pain too severe to attempt OOB activity today     Pertinent Vitals/ Pain       Pain Assessment: 0-10 Pain Score: 9  Pain Location: head and Lt hip  Pain Descriptors / Indicators: Aching;Grimacing;Guarding;Sharp;Stabbing Pain Intervention(s): Monitored during session;Other (comment) (massage )  Home Living                                          Prior Functioning/Environment              Frequency  Min 2X/week        Progress Toward Goals  OT Goals(current goals can now be found in the care plan section)  Progress towards OT goals: Not progressing toward goals - comment (due to pain )     Plan Discharge plan remains appropriate    Co-evaluation                 AM-PAC PT "6 Clicks" Daily Activity     Outcome Measure   Help from another person eating meals?: A Little Help from another person taking care of personal grooming?: A Lot Help from another person toileting, which includes using toliet, bedpan, or urinal?: A Lot Help from another person bathing (including washing, rinsing, drying)?: A Lot Help from another person to put on and taking off regular upper body clothing?: A Lot Help from another person to put on and taking off regular lower body clothing?: A Lot 6 Click Score: 13    End of Session    OT Visit Diagnosis: Unsteadiness on feet (R26.81);Low vision, both eyes (H54.2);Muscle weakness (generalized) (M62.81);Pain Pain - part of body:  (head)   Activity Tolerance Patient limited by pain   Patient Left in bed;with call bell/phone within reach   Nurse Communication Mobility status        Time: 1610-9604 OT Time Calculation (min): 33 min  Charges: OT General Charges $OT Visit: 1  Procedure OT Treatments $Therapeutic Activity: 23-37 mins  [employer], OTR/L [phone]    Jeani Hawking M 04/18/2017, 3:07 PM

## 2017-04-19 ENCOUNTER — Inpatient Hospital Stay (HOSPITAL_COMMUNITY): Payer: BLUE CROSS/BLUE SHIELD

## 2017-04-19 ENCOUNTER — Inpatient Hospital Stay (HOSPITAL_COMMUNITY)
Admission: RE | Admit: 2017-04-19 | Discharge: 2017-05-07 | DRG: 987 | Disposition: A | Discharge status: Home Health | Payer: BLUE CROSS/BLUE SHIELD | Source: Intra-hospital | Attending: Physical Medicine & Rehabilitation | Admitting: Physical Medicine & Rehabilitation

## 2017-04-19 DIAGNOSIS — I609 Nontraumatic subarachnoid hemorrhage, unspecified: Secondary | ICD-10-CM

## 2017-04-19 DIAGNOSIS — H3563 Retinal hemorrhage, bilateral: Secondary | ICD-10-CM | POA: Diagnosis present

## 2017-04-19 DIAGNOSIS — I608 Other nontraumatic subarachnoid hemorrhage: Secondary | ICD-10-CM | POA: Diagnosis not present

## 2017-04-19 DIAGNOSIS — H4902 Third [oculomotor] nerve palsy, left eye: Secondary | ICD-10-CM | POA: Diagnosis present

## 2017-04-19 DIAGNOSIS — I604 Nontraumatic subarachnoid hemorrhage from basilar artery: Secondary | ICD-10-CM | POA: Diagnosis present

## 2017-04-19 DIAGNOSIS — I10 Essential (primary) hypertension: Secondary | ICD-10-CM | POA: Diagnosis present

## 2017-04-19 DIAGNOSIS — I69319 Unspecified symptoms and signs involving cognitive functions following cerebral infarction: Secondary | ICD-10-CM

## 2017-04-19 DIAGNOSIS — Z886 Allergy status to analgesic agent status: Secondary | ICD-10-CM | POA: Diagnosis not present

## 2017-04-19 DIAGNOSIS — D72828 Other elevated white blood cell count: Secondary | ICD-10-CM | POA: Diagnosis not present

## 2017-04-19 DIAGNOSIS — K219 Gastro-esophageal reflux disease without esophagitis: Secondary | ICD-10-CM | POA: Diagnosis present

## 2017-04-19 DIAGNOSIS — H4313 Vitreous hemorrhage, bilateral: Secondary | ICD-10-CM | POA: Diagnosis present

## 2017-04-19 DIAGNOSIS — I69093 Ataxia following nontraumatic subarachnoid hemorrhage: Secondary | ICD-10-CM | POA: Diagnosis not present

## 2017-04-19 DIAGNOSIS — I69092 Facial weakness following nontraumatic subarachnoid hemorrhage: Secondary | ICD-10-CM | POA: Diagnosis not present

## 2017-04-19 DIAGNOSIS — F4323 Adjustment disorder with mixed anxiety and depressed mood: Secondary | ICD-10-CM | POA: Diagnosis present

## 2017-04-19 DIAGNOSIS — I69044 Monoplegia of lower limb following nontraumatic subarachnoid hemorrhage affecting left non-dominant side: Secondary | ICD-10-CM

## 2017-04-19 DIAGNOSIS — D62 Acute posthemorrhagic anemia: Secondary | ICD-10-CM | POA: Diagnosis present

## 2017-04-19 DIAGNOSIS — R2689 Other abnormalities of gait and mobility: Secondary | ICD-10-CM | POA: Diagnosis present

## 2017-04-19 DIAGNOSIS — F17210 Nicotine dependence, cigarettes, uncomplicated: Secondary | ICD-10-CM | POA: Diagnosis present

## 2017-04-19 DIAGNOSIS — Z9049 Acquired absence of other specified parts of digestive tract: Secondary | ICD-10-CM

## 2017-04-19 DIAGNOSIS — I725 Aneurysm of other precerebral arteries: Secondary | ICD-10-CM

## 2017-04-19 DIAGNOSIS — G441 Vascular headache, not elsewhere classified: Secondary | ICD-10-CM | POA: Diagnosis not present

## 2017-04-19 DIAGNOSIS — F32A Depression, unspecified: Secondary | ICD-10-CM

## 2017-04-19 DIAGNOSIS — N390 Urinary tract infection, site not specified: Secondary | ICD-10-CM | POA: Diagnosis not present

## 2017-04-19 DIAGNOSIS — F419 Anxiety disorder, unspecified: Secondary | ICD-10-CM

## 2017-04-19 DIAGNOSIS — R269 Unspecified abnormalities of gait and mobility: Secondary | ICD-10-CM | POA: Diagnosis not present

## 2017-04-19 DIAGNOSIS — I69398 Other sequelae of cerebral infarction: Secondary | ICD-10-CM | POA: Diagnosis not present

## 2017-04-19 DIAGNOSIS — E876 Hypokalemia: Secondary | ICD-10-CM | POA: Diagnosis present

## 2017-04-19 DIAGNOSIS — K5900 Constipation, unspecified: Secondary | ICD-10-CM | POA: Diagnosis present

## 2017-04-19 DIAGNOSIS — T380X5A Adverse effect of glucocorticoids and synthetic analogues, initial encounter: Secondary | ICD-10-CM | POA: Diagnosis present

## 2017-04-19 DIAGNOSIS — Z79899 Other long term (current) drug therapy: Secondary | ICD-10-CM

## 2017-04-19 DIAGNOSIS — H4311 Vitreous hemorrhage, right eye: Secondary | ICD-10-CM

## 2017-04-19 DIAGNOSIS — B9620 Unspecified Escherichia coli [E. coli] as the cause of diseases classified elsewhere: Secondary | ICD-10-CM | POA: Diagnosis not present

## 2017-04-19 DIAGNOSIS — D72829 Elevated white blood cell count, unspecified: Secondary | ICD-10-CM | POA: Diagnosis present

## 2017-04-19 MED ORDER — FLEET ENEMA 7-19 GM/118ML RE ENEM: 1.0000 | ENEMA | Freq: Once | RECTAL | Status: DC | PRN

## 2017-04-19 MED ORDER — ONDANSETRON 4 MG PO TBDP
4.0000 mg | ORAL_TABLET | Freq: Four times a day (QID) | ORAL | Status: DC | PRN
  Filled 2017-04-19 (×2): qty 1

## 2017-04-19 MED ORDER — PROCHLORPERAZINE EDISYLATE 5 MG/ML IJ SOLN: 5.0000 mg | Freq: Four times a day (QID) | INTRAMUSCULAR | Status: DC | PRN

## 2017-04-19 MED ORDER — GUAIFENESIN-DM 100-10 MG/5ML PO SYRP: 5.0000 mL | ORAL_SOLUTION | Freq: Four times a day (QID) | ORAL | Status: DC | PRN

## 2017-04-19 MED ORDER — PROCHLORPERAZINE MALEATE 5 MG PO TABS: 5.0000 mg | ORAL_TABLET | Freq: Four times a day (QID) | ORAL | Status: DC | PRN

## 2017-04-19 MED ORDER — ONDANSETRON HCL 4 MG/2ML IJ SOLN: 4.0000 mg | Freq: Four times a day (QID) | INTRAMUSCULAR | Status: DC | PRN

## 2017-04-19 MED ORDER — NIMODIPINE 60 MG/20ML PO SOLN
60.0000 mg | ORAL | Status: DC
  Administered 2017-04-20: 60 mg
  Filled 2017-04-19 (×5): qty 20

## 2017-04-19 MED ORDER — PREDNISONE 10 MG (21) PO TBPK
ORAL_TABLET | Freq: Four times a day (QID) | ORAL | Status: DC
  Administered 2017-04-19: 10 mg via ORAL
  Administered 2017-04-20: 08:00:00 via ORAL
  Administered 2017-04-20: 10 mg via ORAL
  Administered 2017-04-20 – 2017-04-21 (×2): via ORAL
  Filled 2017-04-19: qty 21

## 2017-04-19 MED ORDER — ALUM & MAG HYDROXIDE-SIMETH 200-200-20 MG/5ML PO SUSP
30.0000 mL | ORAL | Status: DC | PRN
  Administered 2017-04-21: 30 mL via ORAL
  Filled 2017-04-19 (×2): qty 30

## 2017-04-19 MED ORDER — SODIUM CHLORIDE 0.9% FLUSH
10.0000 mL | INTRAVENOUS | Status: DC | PRN
  Administered 2017-04-20: 20 mL
  Filled 2017-04-19: qty 40

## 2017-04-19 MED ORDER — TRAZODONE HCL 50 MG PO TABS
25.0000 mg | ORAL_TABLET | Freq: Every evening | ORAL | Status: DC | PRN
  Administered 2017-04-26: 25 mg via ORAL
  Administered 2017-04-27 – 2017-05-06 (×8): 50 mg via ORAL
  Filled 2017-04-19 (×10): qty 1

## 2017-04-19 MED ORDER — PROCHLORPERAZINE 25 MG RE SUPP: 12.5000 mg | Freq: Four times a day (QID) | RECTAL | Status: DC | PRN

## 2017-04-19 MED ORDER — LIDOCAINE 5 % EX PTCH
1.0000 | MEDICATED_PATCH | CUTANEOUS | Status: DC
  Administered 2017-04-20 – 2017-05-02 (×12): 1 via TRANSDERMAL
  Filled 2017-04-19 (×13): qty 1

## 2017-04-19 MED ORDER — POLYETHYLENE GLYCOL 3350 17 G PO PACK
17.0000 g | PACK | Freq: Every day | ORAL | Status: DC
  Administered 2017-04-19 – 2017-04-27 (×6): 17 g via ORAL
  Filled 2017-04-19 (×8): qty 1

## 2017-04-19 MED ORDER — NIMODIPINE 30 MG PO CAPS
60.0000 mg | ORAL_CAPSULE | ORAL | Status: DC
  Administered 2017-04-19 – 2017-04-25 (×32): 60 mg via ORAL
  Filled 2017-04-19 (×31): qty 2

## 2017-04-19 MED ORDER — BUTALBITAL-APAP-CAFFEINE 50-325-40 MG PO TABS: 1.0000 | ORAL_TABLET | Freq: Four times a day (QID) | ORAL | 0 refills | Status: DC | PRN

## 2017-04-19 MED ORDER — PREDNISONE 10 MG (21) PO TBPK: ORAL_TABLET | ORAL | Status: DC

## 2017-04-19 MED ORDER — ALTEPLASE 2 MG IJ SOLR
2.0000 mg | Freq: Once | INTRAMUSCULAR | Status: AC
  Administered 2017-04-19: 2 mg
  Filled 2017-04-19: qty 2

## 2017-04-19 MED ORDER — POLYETHYLENE GLYCOL 3350 17 G PO PACK
17.0000 g | PACK | Freq: Every day | ORAL | Status: DC | PRN
  Filled 2017-04-19: qty 1

## 2017-04-19 MED ORDER — PANTOPRAZOLE SODIUM 40 MG PO PACK
40.0000 mg | PACK | Freq: Every day | ORAL | Status: DC
  Filled 2017-04-19: qty 20

## 2017-04-19 MED ORDER — DIPHENHYDRAMINE HCL 12.5 MG/5ML PO ELIX: 12.5000 mg | ORAL_SOLUTION | Freq: Four times a day (QID) | ORAL | Status: DC | PRN

## 2017-04-19 MED ORDER — DEXAMETHASONE SODIUM PHOSPHATE 4 MG/ML IJ SOLN
4.0000 mg | Freq: Four times a day (QID) | INTRAMUSCULAR | Status: DC
  Administered 2017-04-20 (×4): 4 mg via INTRAVENOUS
  Filled 2017-04-19 (×5): qty 1

## 2017-04-19 MED ORDER — ACETAMINOPHEN 325 MG PO TABS
325.0000 mg | ORAL_TABLET | ORAL | Status: DC | PRN
  Administered 2017-04-19: 650 mg via ORAL
  Administered 2017-04-20: 325 mg via ORAL
  Administered 2017-04-21 – 2017-05-07 (×21): 650 mg via ORAL
  Filled 2017-04-19 (×25): qty 2

## 2017-04-19 MED ORDER — TOPIRAMATE 25 MG PO TABS
25.0000 mg | ORAL_TABLET | Freq: Every day | ORAL | Status: DC
  Administered 2017-04-19 – 2017-04-20 (×2): 25 mg via ORAL
  Filled 2017-04-19 (×2): qty 1

## 2017-04-19 MED ORDER — BISACODYL 10 MG RE SUPP
10.0000 mg | Freq: Every day | RECTAL | Status: DC | PRN
  Filled 2017-04-19: qty 1

## 2017-04-19 MED ORDER — PANTOPRAZOLE SODIUM 40 MG PO TBEC
40.0000 mg | DELAYED_RELEASE_TABLET | Freq: Every day | ORAL | Status: DC
  Administered 2017-04-20 – 2017-05-07 (×17): 40 mg via ORAL
  Filled 2017-04-19 (×17): qty 1

## 2017-04-19 MED ORDER — NIMODIPINE 30 MG PO CAPS: 60.0000 mg | ORAL_CAPSULE | ORAL | Status: DC

## 2017-04-19 NOTE — Progress Notes (Signed)
Transcranial Doppler  Date POD PCO2 HCT BP  MCA ACA PCA OPHT SIPH VERT Basilar  4/30 rds     Right  Left   73  75   -67  -57   50  47   52  26   46  77   -58  -40   -52      5/2 rds     Right  Left   66  146   -71  -109   41  51   17  25   83  70   -66  -60   -60      5/4 JE     Right  Left   83  90   -76  -66   43  44   33  43   89  101   -43  -51   -60      5/7 rds      Right  Left   140  114   -129  -109   27  46   26  18   62  36   -58  -37   -54      5/9 rs      Right  Left   117  126   -69  -74   25  32   16  15   *  *   -59  -28   -49      5/11 JE     Right  Left   85  108   -84  -60   37  37   33  27   68  109   -30  -16   -55           Right  Left                                        MCA = Middle Cerebral Artery      OPHT = Opthalmic Artery     BASILAR = Basilar Artery   ACA = Anterior Cerebral Artery     SIPH = Carotid Siphon PCA = Posterior Cerebral Artery   VERT = Verterbral Artery                   Normal MCA = 62+\-12 ACA = 50+\-12 PCA = 42+\-23   04/08/17 - Lindegaard Ratio - Right 2.2, Left 2.4 rds 04/10/17 - Lindegaard Ratio - Right 2.0, Left 4.1 rds 5/4 Lindegaard ratio- right 1.6. Left 2.0. 04/15/17 - Lindegaard ratio - Right 5.2, Left 5.3, rds 04/17/17 - Lindegaard ratio - Right 4.0. Left 5.4, * not insonated, rds 5/11 Lindegaard ratio- right 2.8, left 2.7

## 2017-04-19 NOTE — Progress Notes (Signed)
Stopped by to visit w/ pt, who'll soon be discharged to rehab. Provided emotional/spiritual support and prayer, which pt appreciated. She even apologized if she were rude to me during our initial visit, saying she "shut down" when she heard chaplain. We discussed at length how she feels about this illness, and going now to rehab, and her (strong) desire to get home to her family and dog (husband I met, son 51, daughter 69, small chihuahua).   But she feels it's good to accept help, to know she can't be in control any more, she'd been such a control freak, and this is a lesson in that -- for her to show her daughter too,  that there's another way to be.  Encouraged her to work hard in rehab as best means to go home faster and do better there. She's motivated, thanked me for our conversation (during which she told nurse her pain was 4).She's been trying to keep track of the days, and her husband leaves the blinds partly open to help w/ that, but sometimes the light is too bright for her - she prefers it to be darker. She feels she's eating a lot and craves sweets. Chaplain available for f/u.   04/19/17 1400  Clinical Encounter Type  Visited With Patient;Health care provider  Visit Type Follow-up;Psychological support;Spiritual support;Social support  Referral From Chaplain  Spiritual Encounters  Spiritual Needs Prayer;Emotional  Stress Factors  Patient Stress Factors Health changes;Loss of control   Gerrit Heck, Chaplain

## 2017-04-19 NOTE — Progress Notes (Signed)
Pt admitted to unit with husband at bedside. RN educated pt and husband on safety plan and rehab schedule. Call bell within reach, bed alarm in place. A&Ox4 with impulsive behaviors.

## 2017-04-19 NOTE — Care Management Note (Signed)
Case Management Note  Patient Details  Name: [name] Barnett MRN: [id number] Date of Birth: 06/01/1976  Subjective/Objective:    Pt is a 41 y.o. female admitted to ED on 04/07/17 with occipital headache, nausea/vomitting, and LOC leading to fall striking her head. CT shows aneurysmal SAH from basilar artery at L PCA origin; s/p coil embolization 4/29. 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  PTA, pt independent, lives with spouse.               Action/Plan: PT/OT recommending CIR; will follow as pt progresses.    Expected Discharge Date:  04/19/17               Expected Discharge Plan:  IP Rehab Facility  In-House Referral:     Discharge planning Services  CM Consult  Post Acute Care Choice:    Choice offered to:     DME Arranged:    DME Agency:     HH Arranged:    HH Agency:     Status of Service:  Completed, signed off  If discussed at [employer] Meetings, dates discussed:    Additional Comments: 04/19/17 J. Rishi Vicario, RN, BSN Pt medically stable and insurance has authorized admission to inpatient rehab.  Plan dc to [facility] later today.    Quintella BatonJulie W. Junia Nygren, RN, BSN  Trauma/Neuro ICU Case Manager [phone]

## 2017-04-19 NOTE — H&P (Signed)
Physical Medicine and Rehabilitation Admission H&P        Chief Complaint  Patient presents with  . SAH    Due to aneurysmal bleed    HPI:  [employer] a 41 y.o.femalewith history of HTN, GERD who was admitted on 04/07/17 with worsening of HA, episode of emesis followed by LOC with fall and LLE weakness. 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 She was taken to OR by Dr. 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 EVD placed at bedside with improvement in LOC and IS used to help improve low lung volumes. Patient with resultant balance deficits, left gaze palsy with decreased vision Left >Right as well as persistent headaches . 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 EVD removed 5/10  MBS done due to reports of coughing with liquids and chin tuck recommended with liquids to prevent aspiration. She has had reports of changes in vision and opthalmology evaluation showed evidence of bilateral preretinal/vitreous hemorrhage associated with SAH. Dr. Genia Del indicates that vitreous hemorrhage should resolve over next several weeks and no signs of retinal tear or detachment--call for worsening of vision/   Therapy ongoing and patient with ataxic gait with balance deficits, tendency to lean to the left, decrease in visual acuity as well as proprioceptive deficits. CIR recommended due to significant deficits in mobility and ability to perform ADL tasks.    Review of Systems  Constitutional: Negative for fever.  HENT: Negative for hearing loss.   Eyes: Positive for blurred vision and double vision.    Respiratory: Negative for cough.   Cardiovascular: Negative for chest pain.  Gastrointestinal: Positive for nausea.  Genitourinary: Negative for dysuria.  Musculoskeletal: Negative for myalgias.  Skin: Negative for rash.  Neurological: Positive for dizziness, focal weakness and headaches.  Psychiatric/Behavioral: Negative for depression.          Past Medical History:  Diagnosis Date  . Duodenal obstruction   . GERD (gastroesophageal reflux disease)   . Hypertension          Past Surgical History:  Procedure Laterality Date  . BALLOON DILATION  12/31/2011   Procedure: BALLOON DILATION;  Surgeon: Barrie Folk, MD;  Location[facility] ENDOSCOPY;  Service: Endoscopy;  Laterality: N/A;  . CHOLECYSTECTOMY  07/28/11  . ESOPHAGOGASTRODUODENOSCOPY  10/17/2011   Procedure: ESOPHAGOGASTRODUODENOSCOPY (EGD);  Surgeon: Barrie Folk, MD;  Location: [facility] ENDOSCOPY;  Service: Endoscopy;  Laterality: N/A;  . RADIOLOGY WITH ANESTHESIA N/A 04/07/2017   Procedure: RADIOLOGY WITH ANESTHESIA;  Surgeon: Lisbeth Renshaw, MD;  Location: [facility] OR;  Service: Radiology;  Laterality: N/A;  . TEMPOROMANDIBULAR JOINT SURGERY     2 surgeries  . VAGOTOMY  01/23/2012   Procedure: VAGOTOMY, antrectomy and BII;  Surgeon: Currie Paris, MD;  Location: [facility] OR;  Service: General;  Laterality: N/A;  Laparotomy with vagotomy.         Family History  Problem Relation Age of Onset  . Malignant hyperthermia Neg Hx     Social History: Married. Husband works nights for [employer]. Works in an office. She reports that she has been smoking Cigarettes 1- 1.5 PPD depending on stress. She has a 20.00 pack-year smoking history.  She has never used smokeless tobacco. She drinks 10 mixed drinks (T&T) on the weekends. She does not use drugs.    Allergies  Allergen Reactions  . 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 ibuprofen (ADVIL,MOTRIN) 200 MG tablet Take 200 mg by mouth every 6 (six) hours as needed for moderate pain.    . feeding supplement (RESOURCE BREEZE) LIQD Take 1 Container by mouth 2 (two) times daily with a meal. (Patient not taking: Reported on 04/07/2017)    . pantoprazole (PROTONIX) 40 MG tablet Take 1 tablet (40 mg total) by mouth 2 (two) times daily before a meal. 60 tablet 1    Home: Home Living Family/patient expects to be discharged to:: Private residence Living Arrangements: Spouse/significant other, Children Available Help at Discharge: Family, Available 24 hours/day Type of Home: House Home Access: Stairs to enter Entergy CorporationEntrance Stairs-Number of Steps: 3 Entrance Stairs-Rails: Can reach both Home Layout: One level Bathroom Shower/Tub: Health visitorWalk-in shower Bathroom Toilet: Standard Bathroom Accessibility: Yes Home Equipment: Crutches Additional Comments: Husband works night; mother and mother-in-law live nearby and are able to help if needed.  Lives With: Spouse, Daughter 17(12 yo daughter and 921 yo son)   Functional History: Prior Function Level of Independence: Independent Comments: Works as Airline pilotaccountant; Armed forces training and education officerdrives  Functional Status:  Mobility: Bed Mobility Overal bed mobility: Needs Assistance Bed Mobility: Supine to Sit Supine to sit: Min guard, HOB elevated Sit to supine: Supervision General bed mobility comments: supervision for safety due to impulsivity  Transfers Overall transfer level: Needs assistance Equipment used: 1 person hand held assist Transfers: Sit to/from Stand, Stand Pivot Transfers Sit to Stand: Min assist, +2 safety/equipment Stand pivot transfers: Min assist, Mod assist General transfer comment: with bil. hands on bil. Knees, worked on sit to stand with controlled trunk and balance.  She initially required min A, but progressed to mod A as she fatigued.  Heavy lean to Lt as she fatigues    Ambulation/Gait Ambulation/Gait assistance: Mod assist, +2 safety/equipment Ambulation Distance (Feet): 10 Feet (+ 15') Assistive device: 2 person hand held assist Gait Pattern/deviations: Step-through pattern, Decreased stance time - left, Decreased step length - right, Narrow base of support, Ataxic, Leaning posteriorly, Staggering right, Staggering left General Gait Details: Pt with bil hands on shoulders of therapist, cues to decrease speed, assist with weight shifting to advance RLE; tactile and verbal cues to activate left glutes to stabilize during stance phase. posterior lean and lean to the left worsened with fatigue. 1 seated rest break. Gait velocity: Decreased Gait velocity interpretation: Below normal speed for age/gender  ADL: ADL Overall ADL's : Needs assistance/impaired Eating/Feeding: Set up (sitting up in bed) Grooming: Minimal assistance Grooming Details (indicate cue type and reason): Sitting up in bed; Dizziness impacting ability to sit without back support. Upper Body Bathing: Minimal assistance, Sitting (with back support) Lower Body Bathing: Maximal assistance, Sit to/from stand Lower Body Bathing Details (indicate cue type and reason): Due to dizziness Upper Body Dressing : Minimal assistance, Sitting (with back support) Lower Body Dressing: Supervision/safety, Sitting/lateral leans Lower Body Dressing Details (indicate cue type and reason): able to don socks sitting in bed Toilet Transfer: Moderate assistance, +2 for physical assistance, +2 for safety/equipment Toilet Transfer Details (indicate cue type and reason): Pt initially requiring min A, but progressed to mod A as she fatigued  Toileting- Clothing  Manipulation and Hygiene: Maximal assistance, Sit to/from stand Functional mobility during ADLs: Moderate assistance, +2 for physical assistance, +2 for safety/equipment General ADL Comments: L UE ataxic with movement and pt with poor spatial awareness and depth  perception impacting function with L UE reaching.  Cognition: Cognition Overall Cognitive Status: Impaired/Different from baseline Arousal/Alertness: Awake/alert Orientation Level: Oriented X4 Attention: Alternating Alternating Attention: Impaired Alternating Attention Impairment: Functional basic, Verbal basic Memory: Impaired Memory Impairment: Decreased recall of new information, Retrieval deficit Awareness: Impaired Awareness Impairment: Anticipatory impairment Problem Solving: Appears intact Safety/Judgment: Impaired Cognition Arousal/Alertness: Awake/alert Behavior During Therapy: WFL for tasks assessed/performed Overall Cognitive Status: Impaired/Different from baseline Area of Impairment: Safety/judgement, Attention, Awareness Current Attention Level: Selective Safety/Judgement: Decreased awareness of deficits, Decreased awareness of safety Awareness: Emergent Problem Solving: Requires verbal cues, Requires tactile cues General Comments: Pt is very impulsive.  Decreased awareness of severity of impairments and impact on function    Blood pressure 131/66, pulse 65, temperature 97.8 F (36.6 C), temperature source Oral, resp. rate 12, height 5\' 4"  (1.626 m), weight 65.8 kg (145 lb 1 oz), SpO2 97 %. Physical Exam  Constitutional: She appears well-developed. No distress.  HENT:  Mouth/Throat: Oropharynx is clear and moist.  Eyes:  Left peri-orbital ecchymosis   Neck: No tracheal deviation present. No thyromegaly present.  Cardiovascular: Normal rate.  Exam reveals no gallop and no friction rub.   No murmur heard. Respiratory: No respiratory distress. She has no wheezes. She has no rales.  GI: Soft. She exhibits no distension. There is no tenderness.  Neurological:  Right  facial weakness is persistent. Speech clear. Horizontal nystagmus with visual deficits noted in superior and inferior visual field. Left lid ptosis.  Has difficulty with horizontal tracking as well  as superiorly. Ataxia right upper and lower extremity. Right pronator drift. Strength 4/5 RUE prox to distal and RLE prox to distal. 4+ LUE and LLE. Fair insight and awareness. Follows commands. Provides biographical information.  Psychiatric: She has a normal mood and affect. Her behavior is normal.  A little disinhibited    Lab Results Last 48 Hours  No results found for this or any previous visit (from the past 48 hour(s)).   Imaging Results (Last 48 hours)  No results found.       Medical Problem List and Plan: 1.  Functional and mobility deficits secondary to basilar artery aneurysm and subsequent SAH.              -admit to inpatient rehab 2.  DVT Prophylaxis/Anticoagulation: Mechanical: Sequential compression devices, below knee Bilateral lower extremities 3. Headaches/Pain Management: Fioriect  Prn ineffective.Will start on topamax and titrate as indicated.   4. Mood: LCSW to follow for evaluation and support.  5. Neuropsych: This patient is capable of making decisions on her own behalf. 6. Skin/Wound Care: Routine pressure relief measures.  7. Fluids/Electrolytes/Nutrition: Monitor I/O. Offer supplements between meals.  8. HTN: blood pressures continue to be elevated despite nimotop on board--question pain mediated. Will order prn clonidine with set parameters.  9. 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 Hypokalemia: Encourage po intake. Recheck labs in am.  11. ABLA: Will recheck in am.  12. Leucocytosis: Improving--Likely due to steroids. Monitor for signs of infection. Started on dose pack for taper.  13. Constipation: Augment bowel program.      Post Admission Physician Evaluation: 1. Functional deficits  secondary  to [facility]. 2. Patient is admitted to receive collaborative, interdisciplinary care between the physiatrist, rehab nursing staff, and therapy team. 3. Patient's level of medical complexity and substantial therapy needs in context of that medical necessity cannot be provided at a lesser intensity of care such as a SNF. 4. Patient has experienced substantial functional loss from his/her baseline which was documented above under the "Functional History" and "Functional Status" headings.  Judging by the patient's diagnosis, physical exam, and functional history, the patient has potential for functional progress which will result in measurable gains while on inpatient rehab.  These gains will be of substantial and practical use upon discharge  in facilitating mobility and self-care at the household level. 5. Physiatrist will provide 24 hour management of medical needs as well as oversight of the therapy plan/treatment and provide guidance as appropriate regarding the interaction of the two. 6. The Preadmission Screening has been reviewed and patient status is unchanged unless otherwise stated above. 7. 24 hour rehab nursing will assist with bladder management, bowel management, safety, skin/wound care, disease management, medication administration, pain management and patient education  and help integrate therapy concepts, techniques,education, etc. 8. PT will assess and treat for/with: Lower extremity strength, range of motion, stamina, balance, functional mobility, safety, adaptive techniques and equipment, NMR, visual-spatial awareness, vestibular sx mgt, pain control.   Goals are: supervision to mod I. 9. OT will assess and treat for/with: ADL's, functional mobility, safety, upper extremity strength, adaptive techniques and equipment, NMR, visual-spatial awareness, pain mgt, family education, ego support.   Goals are: mod I to min assist. Therapy may proceed with showering this patient. 10. SLP will  assess and treat for/with: cognition, communication, family education.  Goals are: supervision to mod I. 11. Case Management and Social Worker will assess and treat for psychological issues and discharge planning. 12. Team conference will be held weekly to assess progress toward goals and to determine barriers to discharge. 13. Patient will receive at least 3 hours of therapy per day at least 5 days per week. 14. ELOS: 10-16 days       15. Prognosis:  excellent     Ranelle Oyster, MD, [facility] Physical Medicine & Rehabilitation 04/19/2017  Jacquelynn Cree, Cordelia Poche 04/19/2017

## 2017-04-19 NOTE — Progress Notes (Signed)
Physical Therapy Treatment Patient Details Name: [name] Barnett F Walder MRN: [id number] DOB: 01/25/1976 Today's Date: 04/19/2017    History of Present Illness Pt is a 41 y.o. female admitted to ED on 04/07/17 with occipital headache, nausea/vomitting, and LOC leading to fall striking her head. 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 S/p coil embolization 4/29. 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 Pertinent PMH includes HTN, GERD.     PT Comments    Patient required assist to remain focused on task.  Gait remains ataxic, with significant lean to Lt that increases with fatigue.  Agree with need for CIR prior to discharge home.   Follow Up Recommendations  CIR;Supervision for mobility/OOB     Equipment Recommendations  Other (comment) (TBD on CIR)    Recommendations for Other Services       Precautions / Restrictions Precautions Precautions: Fall Precaution Comments: Impulsive Restrictions Weight Bearing Restrictions: No    Mobility  Bed Mobility Overal bed mobility: Needs Assistance Bed Mobility: Supine to Sit;Sit to Supine     Supine to sit: Min assist;HOB elevated Sit to supine: Min assist   General bed mobility comments: Min assist for balance/safety moving supine <> sit.  Patient impulsive.    Transfers Overall transfer level: Needs assistance Equipment used: Rolling walker (2 wheeled) Transfers: Sit to/from Stand Sit to Stand: Min assist;+2 safety/equipment         General transfer comment: Verbal cues for hand placement on bed and recliner to move to stance. Worked on moving to standing, maintaining midline position and control of transfer. Assist to steady during transfer.  Assist to control descent into recliner and bed.  Ambulation/Gait Ambulation/Gait assistance: Mod assist;+2 safety/equipment Ambulation Distance (Feet): 70 Feet (30' and 5740' with sitting rest break  between) Assistive device: Rolling walker (2 wheeled) Gait Pattern/deviations: Step-through pattern;Decreased stride length;Steppage;Ataxic;Staggering left Gait velocity: decreased Gait velocity interpretation: Below normal speed for age/gender General Gait Details: Assist required to maneuver RW.  Patient able to take longer steps today per husband.  Steppage gait with LLE at times, with uncontrolled movement of LLE during swing phase.  As patient fatigues, leans more to Lt, and knees begin to buckle.   Stairs            Wheelchair Mobility    Modified Rankin (Stroke Patients Only) Modified Rankin (Stroke Patients Only) Pre-Morbid Rankin Score: No symptoms Modified Rankin: Moderately severe disability     Balance Overall balance assessment: Needs assistance Sitting-balance support: No upper extremity supported;Feet supported Sitting balance-Leahy Scale: Poor Sitting balance - Comments: Requires assist for balance.   Standing balance support: Bilateral upper extremity supported;During functional activity Standing balance-Leahy Scale: Poor Standing balance comment: Requires UE support on RW, and external assist for balance.                            Cognition Arousal/Alertness: Awake/alert Behavior During Therapy: Impulsive Overall Cognitive Status: Impaired/Different from baseline Area of Impairment: Safety/judgement;Attention;Awareness;Problem solving                   Current Attention Level: Selective     Safety/Judgement: Decreased awareness of deficits;Decreased awareness of safety Awareness: Emergent Problem Solving: Slow processing;Difficulty sequencing;Requires verbal cues        Exercises      General Comments        Pertinent Vitals/Pain Pain Assessment: 0-10 Pain  Score: 7  Pain Location: Rt hip Pain Descriptors / Indicators: Aching;Grimacing;Guarding;Sore Pain Intervention(s): Limited activity within patient's  tolerance;Monitored during session;Repositioned;Heat applied    Home Living                      Prior Function            PT Goals (current goals can now be found in the care plan section) Acute Rehab PT Goals Patient Stated Goal: Return home Progress towards PT goals: Progressing toward goals    Frequency    Min 4X/week      PT Plan Current plan remains appropriate    Co-evaluation              AM-PAC PT "6 Clicks" Daily Activity  Outcome Measure  Difficulty turning over in bed (including adjusting bedclothes, sheets and blankets)?: None Difficulty moving from lying on back to sitting on the side of the bed? : Total Difficulty sitting down on and standing up from a chair with arms (e.g., wheelchair, bedside commode, etc,.)?: Total Help needed moving to and from a bed to chair (including a wheelchair)?: A Lot Help needed walking in hospital room?: A Lot Help needed climbing 3-5 steps with a railing? : A Lot 6 Click Score: 12    End of Session Equipment Utilized During Treatment: Gait belt Activity Tolerance: Patient tolerated treatment well;Patient limited by pain Patient left: in bed;with call bell/phone within reach;with bed alarm set;with family/visitor present;with SCD's reapplied (To bed for transfer to CIR)   PT Visit Diagnosis: Other abnormalities of gait and mobility (R26.89);Ataxic gait (R26.0) Pain - Right/Left: Left Pain - part of body: Hip     Time: 6045-4098 PT Time Calculation (min) (ACUTE ONLY): 24 min  Charges:  $Gait Training: 8-22 mins $Therapeutic Activity: 8-22 mins                    G Codes:       Durenda Hurt. Renaldo Fiddler, [facility] Acute Rehab Services Pager [phone]     Vena Austria 04/19/2017, 9:37 PM

## 2017-04-19 NOTE — Discharge Summary (Signed)
Physician Discharge Summary  Patient ID: [name] Barnett MRN: [id number] DOB/AGE: 42/12/1975 42 y.o.  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 female admitted with subarachnoid hemorrhage. She underwent coiling of a basilar aneurysm. She was found to have hydrocephalus and EVD was placed the following morning. She returned to neurologic baseline and was monitored without clinical sign of vasospasm. EVD was ultimately clamped and removed. She had stable left IIIrd nerve palsy, but was complaining of visual field cut and ophthalmology was consulted and found her to have bilateral retinal hemorrhage which will likely resolve spontaneously. She was therefore stable for D/C to CIR.  Treatments: Surgery - Coiling of basilar aneurysm EVD placement  Discharge Exam: Blood pressure (!) 155/70, pulse 63, temperature 98.2 F (36.8 C), temperature source Oral, resp. rate 14, height 5\' 4"  (1.626 m), weight 145 lb 1 oz (65.8 kg), SpO2 98 %. Awake, alert, oriented Speech fluent, appropriate CN grossly intact 5/5 BUE/BLE Wound c/d/i  Disposition: CIR  Discharge Instructions    Diet - low sodium heart healthy    Complete by:  As directed    Discharge instructions    Complete by:  As directed    Walk at home as much as possible, at least 4 times / day   Increase activity slowly    Complete by:  As directed    May shower / Bathe    Complete by:  As directed    48 hours after surgery   May walk up steps    Complete by:  As directed    No dressing needed    Complete by:  As directed    Other Restrictions    Complete by:  As directed    No bending/twisting at waist      Allergies as of 04/19/2017      Reactions   Nsaids    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   feeding supplement Liqd Take 1 Container by mouth 2 (two) times daily with a meal.   ibuprofen 200 MG tablet Commonly known as:  ADVIL,MOTRIN Take 200 mg by mouth every 6 (six) hours as needed for moderate pain.   niMODipine 30 MG capsule Commonly known as:  NIMOTOP Take 2 capsules (60 mg total) by mouth every 4 (four) hours.   pantoprazole 40 MG tablet Commonly known as:  PROTONIX Take 1 tablet (40 mg total) by mouth 2 (two) times daily before a meal.   predniSONE 10 MG (21) Tbpk tablet Commonly known as:  STERAPRED UNI-PAK 21 TAB Take tapering dose per package instructions      Follow-up Information    Lisbeth RenshawNundkumar, Shalea Tomczak, MD Follow up.   Specialty:  Neurosurgery Contact information: [address] [place] [address] [phone]        Marcelline DeistMincey, Andrew Julian, MD Follow up in 2 week(s).   Specialty:  Ophthalmology Contact information: [address]  [place] [address] [phone]  SignedLisbeth Renshaw, C 04/19/2017, 9:37 AM

## 2017-04-19 NOTE — Progress Notes (Signed)
Rehab admissions - patient has been cleared by attending MD for acute inpatient rehab admission for today.  Bed available and will admit to inpatient rehab today.  Call me for questions.  #[phone]

## 2017-04-20 ENCOUNTER — Inpatient Hospital Stay (HOSPITAL_COMMUNITY): Payer: BLUE CROSS/BLUE SHIELD | Admitting: Physical Therapy

## 2017-04-20 ENCOUNTER — Inpatient Hospital Stay (HOSPITAL_COMMUNITY): Payer: BLUE CROSS/BLUE SHIELD | Admitting: Occupational Therapy

## 2017-04-20 ENCOUNTER — Inpatient Hospital Stay (HOSPITAL_COMMUNITY): Payer: BLUE CROSS/BLUE SHIELD | Admitting: Speech Pathology

## 2017-04-20 DIAGNOSIS — D72829 Elevated white blood cell count, unspecified: Secondary | ICD-10-CM

## 2017-04-20 DIAGNOSIS — H4902 Third [oculomotor] nerve palsy, left eye: Secondary | ICD-10-CM

## 2017-04-20 LAB — COMPREHENSIVE METABOLIC PANEL
ALBUMIN: 2.9 g/dL — AB (ref 3.5–5.0)
ALT: 18 U/L (ref 14–54)
ANION GAP: 8 (ref 5–15)
AST: 17 U/L (ref 15–41)
Alkaline Phosphatase: 68 U/L (ref 38–126)
BUN: 14 mg/dL (ref 6–20)
CO2: 28 mmol/L (ref 22–32)
Calcium: 9.1 mg/dL (ref 8.9–10.3)
Chloride: 101 mmol/L (ref 101–111)
Creatinine, Ser: 0.61 mg/dL (ref 0.44–1.00)
GFR calc Af Amer: >60 mL/min (ref >60)
GFR calc non Af Amer: >60 mL/min (ref >60)
GLUCOSE: 114 mg/dL — AB (ref 65–99)
POTASSIUM: 4.2 mmol/L (ref 3.5–5.1)
SODIUM: 137 mmol/L (ref 135–145)
Total Bilirubin: 0.3 mg/dL (ref 0.3–1.2)
Total Protein: 5.9 g/dL — ABNORMAL LOW (ref 6.5–8.1)

## 2017-04-20 LAB — URINALYSIS, ROUTINE W REFLEX MICROSCOPIC
Bilirubin Urine: NEGATIVE
Glucose, UA: NEGATIVE mg/dL
Ketones, ur: NEGATIVE mg/dL
NITRITE: POSITIVE — AB
Protein, ur: NEGATIVE mg/dL
SPECIFIC GRAVITY, URINE: 1.013 (ref 1.005–1.030)
pH: 6 (ref 5.0–8.0)

## 2017-04-20 LAB — CBC WITH DIFFERENTIAL/PLATELET
Basophils Absolute: 0 10*3/uL (ref 0.0–0.1)
Basophils Relative: 0 %
EOS ABS: 0 10*3/uL (ref 0.0–0.7)
Eosinophils Relative: 0 %
HCT: 35.9 % — ABNORMAL LOW (ref 36.0–46.0)
HEMOGLOBIN: 11 g/dL — AB (ref 12.0–15.0)
LYMPHS PCT: 14 %
Lymphs Abs: 2.9 10*3/uL (ref 0.7–4.0)
MCH: 25.3 pg — ABNORMAL LOW (ref 26.0–34.0)
MCHC: 30.6 g/dL (ref 30.0–36.0)
MCV: 82.7 fL (ref 78.0–100.0)
Monocytes Absolute: 0.8 10*3/uL (ref 0.1–1.0)
Monocytes Relative: 4 %
NEUTROS ABS: 16.8 10*3/uL — AB (ref 1.7–7.7)
Neutrophils Relative %: 82 %
Platelets: 329 10*3/uL (ref 150–400)
RBC: 4.34 MIL/uL (ref 3.87–5.11)
RDW: 17.1 % — AB (ref 11.5–15.5)
WBC: 20.5 10*3/uL — ABNORMAL HIGH (ref 4.0–10.5)

## 2017-04-20 NOTE — Progress Notes (Signed)
[place] PHYSICAL MEDICINE & REHABILITATION     PROGRESS NOTE    Subjective/Complaints: Still having headaches. Says she was interrupted too much last night--didn't sleep as a result  ROS: pt denies nausea, vomiting, diarrhea, cough, shortness of breath or chest pain   Objective: Vital Signs: Blood pressure (!) 157/82, pulse (!) 57, temperature 98.1 F (36.7 C), temperature source Oral, resp. rate 18, height 5\' 4"  (1.626 m), SpO2 97 %. No results found.  Recent Labs  04/20/17 0458  WBC 20.5*  HGB 11.0*  HCT 35.9*  PLT 329    Recent Labs  04/20/17 0458  NA 137  K 4.2  CL 101  GLUCOSE 114*  BUN 14  CREATININE 0.61  CALCIUM 9.1   CBG (last 3)  No results for input(s): GLUCAP in the last 72 hours.  Wt Readings from Last 3 Encounters:  04/07/17 65.8 kg (145 lb 1 oz)  02/08/12 51.7 kg (114 lb)  01/21/12 52.4 kg (115 lb 8.3 oz)    Physical Exam:  Constitutional: She appears well-developed. No distress.  HENT:  Mouth/Throat: Oropharynx is clear and moist.  Eyes:  Left peri-orbital ecchymosis, lid swelling  Neck: No tracheal deviationpresent. No thyromegalypresent.  Cardiovascular: Normal rate. Exam reveals no gallopand no friction rub.  No murmurheard. Respiratory: No respiratory distress. She has no wheezes. She has no rales.  GI: Soft. She exhibits no distension. There is no tenderness.  Neurological:  Right facial weakness is persistent. Speech clear. Horizontal nystagmus with visual deficits noted in superior and inferior visual field. Left lid ptosis. struggles with horizontal tracking as well as superiorly. Ataxia right upper and lower extremity. Right pronator drift. Strength 4/5 RUE prox to distal and RLE prox to distal. 4+ LUE and LLE. Fair insight and awareness. Follows commands. Provides biographical information. Psychiatric: She has a normal mood and affect. Her behavior is normal.  A little disinhibited Skin: sutures/wounds clean on  scalp  Assessment/Plan: 1. Functional, mobility deficits  [facility] which require 3+ hours per day of interdisciplinary therapy in a comprehensive inpatient rehab setting. Physiatrist is providing close team supervision and 24 hour management of active medical problems listed below. Physiatrist and rehab team continue to assess barriers to discharge/monitor patient progress toward functional and medical goals.  Function:  Bathing Bathing position      Bathing parts      Bathing assist        Upper Body Dressing/Undressing Upper body dressing                    Upper body assist        Lower Body Dressing/Undressing Lower body dressing                                  Lower body assist        Toileting Toileting          Toileting assist     Transfers Chair/bed transfer             Locomotion Ambulation           Wheelchair          Cognition Comprehension Comprehension assist level: Follows complex conversation/direction with extra time/assistive device  Expression Expression assist level: Expresses complex ideas: With extra time/assistive device  Social Interaction Social Interaction assist level: Interacts appropriately with others with medication or extra time (anti-anxiety, antidepressant).  Problem Solving  Problem solving assist level: Solves complex 90% of the time/cues < 10% of the time  Memory     Medical Problem List and Plan: 1. 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  -begin therapies today 2. DVT Prophylaxis/Anticoagulation: Mechanical: Sequential compression devices, below knee Bilateral lower extremities 3. Headaches/Pain Management: Fioriect Prn ineffective.  -topamax initiated last night--titrate as needed.  4. Mood: LCSW to follow for evaluation and support.  5. Neuropsych: This patient iscapable of making decisions on herown behalf. 6.  Skin/Wound Care: Routine pressure relief measures.  7. Fluids/Electrolytes/Nutrition: Monitor I/O. Encourage PO  -I personally reviewed the patient's labs today.   -potassium wnl  8. HTN: nimotop taper for vasospasms  -prn clonidine  -some improvement today 9. 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 ABLA: Hgb 11.0---no signs of blood loss  12. Leucocytosis: up to 20k  -afebrile, lungs clear, wounds clean  -on steroid taper too  -check UA, UCX .  13. Constipation: Augment bowel program.  LOS (Days) 1 A FACE TO FACE EVALUATION WAS PERFORMED  Faith Rogue T, MD 04/20/2017 9:05 AM

## 2017-04-20 NOTE — Evaluation (Signed)
Physical Therapy Assessment and Plan  Patient Details  Name: [name] Barnett MRN: [id number] Date of Birth: [birth year]  PT Diagnosis: Ataxia, Ataxic gait, Impaired cognition, Impaired sensation and Muscle weakness Rehab Potential: Good ELOS: 16 to 18 days   Today's Date: 04/20/2017 PT Individual Time: 1300-1415 PT Individual Time Calculation (min): 75 min    Problem List:  Patient Active Problem List   Diagnosis Date Noted  . SAH (subarachnoid hemorrhage) ([place]) 04/19/2017  . 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 Hypoxia   . Subarachnoid hemorrhage due to ruptured aneurysm ([place]) 04/07/2017  . Elevated gastrin level 01/25/2012  . Hypokalemia 01/21/2012  . Nausea & vomiting 01/20/2012  . Epigastric pain 01/20/2012  . Duodenal ulcer, acute with obstruction 10/17/2011  . S/P laparoscopic cholecystectomy 10/16/2011    Past Medical History:  Past Medical History:  Diagnosis Date  . Duodenal obstruction   . GERD (gastroesophageal reflux disease)   . Hypertension    Past Surgical History:  Past Surgical History:  Procedure Laterality Date  . BALLOON DILATION  12/31/2011   Procedure: BALLOON DILATION;  Surgeon: Missy Sabins, MD;  Location: [facility] ENDOSCOPY;  Service: Endoscopy;  Laterality: N/A;  . CHOLECYSTECTOMY  07/28/11  . ESOPHAGOGASTRODUODENOSCOPY  10/17/2011   Procedure: ESOPHAGOGASTRODUODENOSCOPY (EGD);  Surgeon: Missy Sabins, MD;  Location[facility] ENDOSCOPY;  Service: Endoscopy;  Laterality: N/A;  . RADIOLOGY WITH ANESTHESIA N/A 04/07/2017   Procedure: RADIOLOGY WITH ANESTHESIA;  Surgeon: Consuella Lose, MD;  Location: [place];  Service: Radiology;  Laterality: N/A;  . TEMPOROMANDIBULAR JOINT SURGERY     2 surgeries  . VAGOTOMY  01/23/2012   Procedure: VAGOTOMY, antrectomy and BII;  Surgeon: Haywood Lasso, MD;  Location: [place];  Service: General;  Laterality: N/A;  Laparotomy with vagotomy.    Assessment & Plan Clinical Impression: Patient is a 41 y.o.femalewith history of HTN,  GERD who was admitted on 04/07/17 with worsening of HA, episode of emesis followed by LOC with fall and LLE weakness. 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 She was taken to OR by Dr. Kathyrn Sheriff and underwent coil embolization of basilar artery aneurysm.[name] overnight she developed hypoxia with increase in lethargy and CT head showed L-SCA stroke with compression of aqueduct and progressive ventriculomegaly. EVD placed at bedside with improvement in LOC and IS used to help improve low lung volumes. Patient with resultant balance deficits, left gaze palsy with decreased vision Left >Right as well as persistentheadaches . 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 EVD removed 5/10  MBS done due to reports of coughing with liquids and chin tuck recommended with liquids to prevent aspiration. She has had reports of changes in vision and opthalmology evaluation showed evidence of bilateral preretinal/vitreous hemorrhage associated with SAH. Dr. Alanda Slim indicates that vitreous hemorrhage should resolve over next several weeks and no signs of retinal tear or detachment--call for worsening of vision/  Therapy ongoing and patient with ataxic gait with balance deficits, tendency to lean to the left, decrease in visual acuity as well as proprioceptive deficits. CIR recommended due to significant deficits in mobility and ability to perform ADL tasks.   Patient transferred to CIR on 04/19/2017 .   Patient currently requires mod with mobility secondary to muscle weakness, decreased cardiorespiratoy endurance, ataxia, decreased attention to left and decreased safety awareness.  Prior to hospitalization, patient was independent  with mobility and lived with Spouse, Daughter in a House home.  Home access is 4Stairs to  enter.  Patient will benefit from skilled PT intervention to maximize safe functional mobility, minimize fall risk and decrease caregiver burden for planned discharge home with 24 hour supervision.  Anticipate patient will benefit from follow up [place] at discharge.  PT - End of Session Activity Tolerance: Tolerates 30+ min activity with multiple rests Endurance Deficit: Yes Endurance Deficit Description: Required multiple rest breaks throughout session  PT Assessment Rehab Potential (ACUTE/IP ONLY): Good Barriers to Discharge: Inaccessible home environment;Decreased caregiver support PT Patient demonstrates impairments in the following area(s): Behavior;Motor;Perception;Balance;Endurance PT Transfers Functional Problem(s): Bed Mobility;Bed to Chair;Car PT Locomotion Functional Problem(s): Ambulation;Wheelchair Mobility;Stairs PT Plan PT Intensity: Minimum of 1-2 x/day ,45 to 90 minutes PT Frequency: 5 out of 7 days PT Duration Estimated Length of Stay: 16 to 18 days PT Treatment/Interventions: Ambulation/gait training;Balance/vestibular training;Discharge planning;DME/adaptive equipment instruction;Neuromuscular re-education;Functional mobility training;Patient/family education;Splinting/orthotics;Stair training;Therapeutic Activities;Therapeutic Exercise;UE/LE Strength taining/ROM;UE/LE Coordination activities;Visual/perceptual remediation/compensation;Wheelchair propulsion/positioning PT Transfers Anticipated Outcome(s): mod I  PT Locomotion Anticipated Outcome(s): S gait, S stairs, mod I w/c mobility PT Recommendation Follow Up Recommendations: Home health PT Patient destination: Home Equipment Recommended: To be determined  Skilled Therapeutic Intervention PT evaluation completed and treatment plan initiated. Pt performed multiple sit to stand transfers with min A. Pt ambulated 25 and 50 feet without assistive device and mod to max A with verbal cues. Following treatment, pt returned to  room. Pt transferred w.c to edge of bed with min to mod A and verbal cues. Pt transferred edge of bed to supine with min guard. Pt left sitting up in bed with call bell within reach.   PT Evaluation Precautions/Restrictions Precautions Precautions: Fall Precaution Comments: Impulsive Restrictions Weight Bearing Restrictions: No General Chart Reviewed: Yes Family/Caregiver Present: Yes  Pain Pt c/o 8/10 pain headache and back pain. Pt also c/o L hip pain with activity.  Home Living/Prior Functioning Home Living Available Help at Discharge: Family;Available 24 hours/day Type of Home: House Home Access: Stairs to enter [employer] of Steps: 4 Entrance Stairs-Rails: Can reach both;Right;Left Home Layout: One level Bathroom Shower/Tub: Multimedia programmer: Standard Bathroom Accessibility: Yes Additional Comments: Husband works night; mother and mother-in-law live nearby and are able to help if needed.  Lives With: Spouse;Daughter Prior Function Level of Independence: Independent with gait;Independent with transfers  Able to Take Stairs?: Yes Driving: Yes Vocation: Full time employment Comments: Works as Optometrist; Materials engineer: Impaired Inattention/Neglect: Does not attend to left visual field;Does not attend to left side of body  Cognition Overall Cognitive Status: Impaired/Different from baseline Arousal/Alertness: Awake/alert Orientation Level: Oriented X4 Alternating Attention: Appears intact Memory: Impaired Memory Impairment: Decreased recall of new information Awareness: Impaired Awareness Impairment: Anticipatory impairment Problem Solving: Impaired Problem Solving Impairment: Functional complex Behaviors: Impulsive Safety/Judgment: Impaired Sensation Sensation Proprioception: Impaired by gross assessment Additional Comments: L LE  Coordination Gross Motor Movements are Fluid and Coordinated: No Fine  Motor Movements are Fluid and Coordinated: No Coordination and Movement Description: L UE ataxia Finger Nose Finger Test: Overshoooting Motor  Motor Motor: Ataxia  Mobility Bed Mobility Bed Mobility: Rolling Right;Supine to Sit;Sit to Supine Rolling Right: 5: Supervision Supine to Sit: 3: Mod assist Sit to Supine: 4: Min assist Transfers Transfers: Yes Sit to Stand: 4: Min assist Stand to Sit: 4: Min assist Stand Pivot Transfers: 3: Mod assist Locomotion  Ambulation Ambulation: Yes Ambulation/Gait Assistance:  3: Mod assist;2: Max Wellsite geologist (Feet): 50 Feet Assistive device: None Stairs / Additional Locomotion Stairs: Yes Stairs Assistance: 3: Mod assist Stair Management Technique: Two rails Number of Stairs: 4 Height of Stairs: 6 Wheelchair Mobility Wheelchair Mobility: Yes Wheelchair Assistance: 5: Careers information officer: Both upper extremities Distance: 25  Trunk/Postural Assessment  Cervical Assessment Cervical Assessment: Within Scientist, physiological Assessment: Within Functional Limits Lumbar Assessment Lumbar Assessment: Within Functional Limits Postural Control Postural Control: Deficits on evaluation  Balance Balance Balance Assessed: Yes Static Sitting Balance Static Sitting - Balance Support: Feet supported Static Sitting - Level of Assistance: 5: Stand by assistance Dynamic Sitting Balance Dynamic Sitting - Balance Support: During functional activity Dynamic Sitting - Level of Assistance: 4: Min Insurance risk surveyor Standing - Balance Support: During functional activity Static Standing - Level of Assistance: 3: Mod assist;2: Max assist Dynamic Standing Balance Dynamic Standing - Balance Support: During functional activity Dynamic Standing - Level of Assistance: 2: Max assist Extremity Assessment B UEs as per OT evaluation.   RLE Assessment RLE Assessment: Within Functional  Limits LLE Assessment LLE Assessment: Exceptions to WFL LLE PROM (degrees) Overall PROM Left Lower Extremity: Within functional limits for tasks assessed LLE Strength LLE Overall Strength: Deficits LLE Overall Strength Comments: 3-/5 to 3/5   See Function Navigator for Current Functional Status.   Refer to Care Plan for Long Term Goals  Recommendations for other services: None   Discharge Criteria: Patient will be discharged from PT if patient refuses treatment 3 consecutive times without medical reason, if treatment goals not met, if there is a change in medical status, if patient makes no progress towards goals or if patient is discharged from hospital.  The above assessment, treatment plan, treatment alternatives and goals were discussed and mutually agreed upon: by patient and by family  Dub Amis 04/20/2017, 4:15 PM

## 2017-04-20 NOTE — Evaluation (Signed)
Speech Language Pathology Assessment and Plan  Patient Details  Name: [name] Barnett MRN: [id number] Date of Birth: [birth year]  SLP Diagnosis: Cognitive Impairments;Dysphagia  Rehab Potential: Excellent ELOS: 12-14 days     Today's Date: 04/20/2017 SLP Individual Time: 1100-1200 SLP Individual Time Calculation (min): 60 min   Problem List:  Patient Active Problem List   Diagnosis Date Noted  . SAH (subarachnoid hemorrhage) ([place]) 04/19/2017  . 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 Hypoxia   . Subarachnoid hemorrhage due to ruptured aneurysm ([place]) 04/07/2017  . Elevated gastrin level 01/25/2012  . Hypokalemia 01/21/2012  . Nausea & vomiting 01/20/2012  . Epigastric pain 01/20/2012  . Duodenal ulcer, acute with obstruction 10/17/2011  . S/P laparoscopic cholecystectomy 10/16/2011   Past Medical History:  Past Medical History:  Diagnosis Date  . Duodenal obstruction   . GERD (gastroesophageal reflux disease)   . Hypertension    Past Surgical History:  Past Surgical History:  Procedure Laterality Date  . BALLOON DILATION  12/31/2011   Procedure: BALLOON DILATION;  Surgeon: Missy Sabins, MD;  Location: [facility] ENDOSCOPY;  Service: Endoscopy;  Laterality: N/A;  . CHOLECYSTECTOMY  07/28/11  . ESOPHAGOGASTRODUODENOSCOPY  10/17/2011   Procedure: ESOPHAGOGASTRODUODENOSCOPY (EGD);  Surgeon: Missy Sabins, MD;  Location: [facility] ENDOSCOPY;  Service: Endoscopy;  Laterality: N/A;  . RADIOLOGY WITH ANESTHESIA N/A 04/07/2017   Procedure: RADIOLOGY WITH ANESTHESIA;  Surgeon: Consuella Lose, MD;  Location: [place];  Service: Radiology;  Laterality: N/A;  . TEMPOROMANDIBULAR JOINT SURGERY     2 surgeries  . VAGOTOMY  01/23/2012   Procedure: VAGOTOMY, antrectomy and BII;  Surgeon: Haywood Lasso, MD;  Location: [place];  Service: General;  Laterality: N/A;  Laparotomy with vagotomy.    Assessment / Plan / Recommendation Clinical Impression Patient is a 40 y.o.femalewith history of HTN, GERD who was  admitted on 04/07/17 with worsening of HA, episode of emesis followed by LOC with fall and LLE weakness. 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 She was taken to OR by Dr. Kathyrn Sheriff and underwent coil embolization of basilar artery aneurysm.[name] overnight she developed hypoxia with increase in lethargy and CT head showed L-SCA stroke with compression of aqueduct and progressive ventriculomegaly. EVD placed at bedside with improvement in LOC and IS used to help improve low lung volumes. Patient with resultant balance deficits, left gaze palsy with decreased vision Left >Right as well as persistentheadaches . 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 EVD removed 5/10. FEES done due to reports of coughing with liquids and chin tuck recommended with liquids to prevent aspiration. She has had reports of changes in vision and opthalmology evaluation showed evidence of bilateral preretinal/vitreous hemorrhage associated with SAH. Dr. Alanda Slim indicates that vitreous hemorrhage should resolve over next several weeks and no signs of retinal tear or detachment--call for worsening of vision. Therapy ongoing and patient with ataxic gait with balance deficits, tendency to lean to the left, decrease in visual acuity as well as proprioceptive deficits. CIR recommended due to significant deficits in mobility and ability to perform ADL tasks and patient admitted 04/19/17.  Patient demonstrates a mild pharyngeal dysphagia consistent with results from most recent FEES. Patient demonstrated subtle throat clearing with thin liquids when not utilizing chin tuck correctly, suspect due to impaired timing. However, throat clearing eliminated with  complete chin tuck. Patient also consumed regular textures and demonstrated efficient  mastication without oral reside. Recommend patient continue current diet with full supervision for use of compensatory strategies. Patient also demonstrates mild cognitive impairments impacting recall, problem solving, awareness and overall safety with functional tasks. Patient would benefit from skilled SLP intervention to maximize her cognitive and swallowing function and overall functional independence prior to discharge.      Skilled Therapeutic Interventions          Administered a BSE and cognitive-linguistic evaluation. Please see above for details.   SLP Assessment  Patient will need skilled Speech Lanaguage Pathology Services during CIR admission    Recommendations  SLP Diet Recommendations: Age appropriate regular solids;Thin Liquid Administration via: Cup;Straw Medication Administration: Whole meds with liquid (or puree) Supervision: Patient able to self feed;Full supervision/cueing for compensatory strategies Compensations: Slow rate;Small sips/bites;Chin tuck Postural Changes and/or Swallow Maneuvers: Seated upright 90 degrees Oral Care Recommendations: Oral care BID Patient destination: Home Follow up Recommendations:  (TBD) Equipment Recommended: None recommended by SLP    SLP Frequency 3 to 5 out of 7 days   SLP Duration  SLP Intensity  SLP Treatment/Interventions 12-14 days   Minumum of 1-2 x/day, 30 to 90 minutes  Cognitive remediation/compensation;Dysphagia/aspiration precaution training;Cueing hierarchy;Internal/external aids;Functional tasks;Patient/family education;Therapeutic Activities;Environmental controls    Pain Pain Assessment Pain Score: 3   Prior Functioning Type of Home: House  Lives With: Spouse;Daughter (64 yo daughter and 12 yo son) Available Help at Discharge: Family;Available 24 hours/day  Function:  Eating Eating   Modified Consistency Diet: No Eating Assist Level: Supervision or verbal cues           Cognition Comprehension  Comprehension assist level: Follows complex conversation/direction with extra time/assistive device  Expression   Expression assist level: Expresses complex ideas: With extra time/assistive device  Social Interaction Social Interaction assist level: Interacts appropriately with others with medication or extra time (anti-anxiety, antidepressant).  Problem Solving Problem solving assist level: Solves basic 75 - 89% of the time/requires cueing 10 - 24% of the time  Memory Memory assist level: Recognizes or recalls 75 - 89% of the time/requires cueing 10 - 24% of the time   Short Term Goals: Week 1: SLP Short Term Goal 1 (Week 1): Patient will consume current diet without overt s/s of aspiration and supervision verbal cues for use of cin tuck. SLP Short Term Goal 2 (Week 1): Patient will recall new, daily information with Mod I.  SLP Short Term Goal 3 (Week 1): Patient will demonstrate complex problem solving with Mod I.  SLP Short Term Goal 4 (Week 1): Patient will identify activities that she will need assistance with at discharge with supervision verbal cues.  SLP Short Term Goal 5 (Week 1): Patient will attend to LUE during functional tasks with Min A verbal cues.   Refer to Care Plan for Long Term Goals  Recommendations for other services: None   Discharge Criteria: Patient will be discharged from SLP if patient refuses treatment 3 consecutive times without medical reason, if treatment goals not met, if there is a change in medical status, if patient makes no progress towards goals or if patient is discharged from hospital.  The above assessment, treatment plan, treatment alternatives and goals were discussed and mutually agreed upon: by patient  Shazia Mitchener 04/20/2017, 3:54 PM

## 2017-04-20 NOTE — Evaluation (Addendum)
Occupational Therapy Assessment and Plan  Patient Details  Name: [name] Barnett MRN: [id number] Date of Birth: Dec 16, 1975  OT Diagnosis: ataxia, disturbance of vision and muscle weakness (generalized) Rehab Potential: Rehab Potential (ACUTE ONLY): Excellent ELOS: 2 weeks   Today's Date: 04/20/2017  OT Individual Time: 0800-0900 OT Individual Time Calculation (min): 60 min    Problem List:  Patient Active Problem List   Diagnosis Date Noted  . SAH (subarachnoid hemorrhage) ([place]) 04/19/2017  . 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 Hypoxia   . Subarachnoid hemorrhage due to ruptured aneurysm ([place]) 04/07/2017  . Elevated gastrin level 01/25/2012  . Hypokalemia 01/21/2012  . Nausea & vomiting 01/20/2012  . Epigastric pain 01/20/2012  . Duodenal ulcer, acute with obstruction 10/17/2011  . S/P laparoscopic cholecystectomy 10/16/2011    Past Medical History:  Past Medical History:  Diagnosis Date  . Duodenal obstruction   . GERD (gastroesophageal reflux disease)   . Hypertension    Past Surgical History:  Past Surgical History:  Procedure Laterality Date  . BALLOON DILATION  12/31/2011   Procedure: BALLOON DILATION;  Surgeon: Missy Sabins, MD;  Location: [facility] ENDOSCOPY;  Service: Endoscopy;  Laterality: N/A;  . CHOLECYSTECTOMY  07/28/11  . ESOPHAGOGASTRODUODENOSCOPY  10/17/2011   Procedure: ESOPHAGOGASTRODUODENOSCOPY (EGD);  Surgeon: Missy Sabins, MD;  Location: [facility] ENDOSCOPY;  Service: Endoscopy;  Laterality: N/A;  . RADIOLOGY WITH ANESTHESIA N/A 04/07/2017   Procedure: RADIOLOGY WITH ANESTHESIA;  Surgeon: Consuella Lose, MD;  Location: [place];  Service: Radiology;  Laterality: N/A;  . TEMPOROMANDIBULAR JOINT SURGERY     2 surgeries  . VAGOTOMY  01/23/2012   Procedure: VAGOTOMY, antrectomy and BII;  Surgeon: Haywood Lasso, MD;  Location: [place];  Service: General;  Laterality: N/A;  Laparotomy with vagotomy.    Assessment & Plan Clinical Impression: Patient is a 41 y.o. year old  female with recent admission to the hospital on 04/07/17 with occipital headache, nausea/vomitting, and LOC leading to fall striking her head. 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 S/p coil embolization 4/29. 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 Pertinent PMH includes HTN, GERD. Patient transferred to CIR on 04/19/2017 .    Patient currently requires mod/Max A with basic self-care skills secondary to muscle weakness, ataxia and decreased coordination, decreased visual acuity and decreased visual perceptual skills, decreased attention to left, and decreased sitting balance, decreased standing balance, decreased postural control and decreased balance strategies.  Prior to hospitalization, patient could complete ADL and iADL with independent .  Patient will benefit from skilled intervention to increase independence with basic self-care skills prior to discharge home with care partner.  Anticipate patient will require intermittent supervision and follow up home health.  OT - End of Session Endurance Deficit: Yes Endurance Deficit Description: Required multiple rest breaks throughout session  OT Assessment Rehab Potential (ACUTE ONLY): Excellent OT Patient demonstrates impairments in the following area(s): Balance;Cognition;Endurance;Motor;Perception;Safety;Vision OT Basic ADL's Functional Problem(s): Eating;Grooming;Bathing;Dressing;Toileting OT Transfers Functional Problem(s): Toilet;Tub/Shower OT Additional Impairment(s): Fuctional Use of Upper Extremity OT Plan OT Intensity: Minimum of 1-2 x/day, 45 to 90 minutes OT Frequency: 5 out of 7 days OT Duration/Estimated Length of Stay: 2 weeks OT Treatment/Interventions: Balance/vestibular training;Cognitive remediation/compensation;Community reintegration;Discharge planning;DME/adaptive equipment instruction;Functional mobility training;Neuromuscular  re-education;Patient/family education;Pain management;Psychosocial support;Self Care/advanced ADL retraining;Therapeutic Activities;UE/LE Strength taining/ROM;Therapeutic Exercise;UE/LE Coordination activities;Visual/perceptual remediation/compensation;Wheelchair propulsion/positioning OT Self Feeding Anticipated Outcome(s): Mod I OT Basic Self-Care Anticipated Outcome(s): Mod I  OT Toileting Anticipated Outcome(s): Mod I OT Bathroom Transfers Anticipated Outcome(s): Mod I OT Recommendation Recommendations for Other Services: Therapeutic Recreation consult;Neuropsych consult Patient destination: Home Follow Up Recommendations: Home health OT Equipment Recommended: To be determined   Skilled Therapeutic Intervention OT eval completed addressing rehab process, OT purpose, POC, ELOS, and goals.  Stand-pivot trasnfers bed>wc>toilet>shower bench with overall Mod A. Pt voided bladder and required min guard A for sitting balance on toilet 2/2 lateral LOB to left. Pt required VC to integrate L UE into  functional bathing/dressing tasks with pt reporting " I have to really think about it to use my left hand." Mod-Max A standing balance when pulling up pants 2/2 posterior and lateral sway/LOB. Pt slightly impulsive, with diminished proprioception, and awareness. Pt returned to bed at end of session and left with needs met and bed alarm on.    OT Evaluation Precautions/Restrictions  Precautions Precautions: Fall Precaution Comments: Impulsive Restrictions Weight Bearing Restrictions: No Pain Pain Assessment Pain Assessment: 0-10 Pain Score: 6  Pain Type: Acute pain Pain Location: Head Pain Orientation: Left Pain Descriptors / Indicators: Headache Pain Onset: On-going Pain Intervention(s): Repositioned Home Living/Prior Functioning Home Living Available Help at Discharge: Family, Available 24 hours/day Type of Home: House Home Access: Stairs to enter Technical brewer of Steps:  3 Entrance Stairs-Rails: Can reach both Home Layout: One level Bathroom Shower/Tub: Multimedia programmer: Standard Bathroom Accessibility: Yes Additional Comments: Husband works night; mother and mother-in-law live nearby and are able to help if needed.  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; drives ADL ADL ADL Comments: Please see functional navigator Vision Patient Visual Report: Eye fatigue/eye pain/headache Vision Assessment?: Vision impaired- to be further tested in functional context Visual Fields: Impaired-to be further tested in functional context Perception  Perception: Impaired Inattention/Neglect: Does not attend to left visual field;Does not attend to left side of body Cognition Overall Cognitive Status: Impaired/Different from baseline Arousal/Alertness: Awake/alert Orientation Level: Place;Situation;Person Person: Oriented Place: Oriented Situation: Oriented Year: 2018 Month: May Day of Week: Correct Memory: Impaired Memory Impairment: Decreased recall of new information;Retrieval deficit Immediate Memory Recall: Sock;Blue;Bed Memory Recall: Blue Memory Recall Blue: With Cue Awareness: Impaired Awareness Impairment: Anticipatory impairment Problem Solving: Impaired Problem Solving Impairment: Functional complex Behaviors: Impulsive Safety/Judgment: Impaired Sensation Sensation Proprioception: Impaired by gross assessment Coordination Gross Motor Movements are Fluid and Coordinated: No Fine Motor Movements are Fluid and Coordinated: No Coordination and Movement Description: L UE ataxia Finger Nose Finger Test: Overshoooting Motor  Motor Motor: Ataxia Balance Dynamic Sitting Balance Dynamic Sitting - Balance  Support: During functional activity Dynamic Sitting - Level of Assistance: 4: Min assist;3: Mod assist Static Standing Balance Static Standing - Balance Support: During functional activity Static Standing - Level of Assistance: 3: Mod assist Dynamic Standing Balance Dynamic Standing - Balance Support: During functional activity Dynamic Standing - Level of Assistance: 2: Max assist Extremity/Trunk Assessment RUE Assessment RUE Assessment: Within Functional Limits (4/5) LUE Assessment LUE Assessment: Within Functional Limits (4/5, deficits in coordination)   See Function Navigator for Current Functional Status.   Refer to Care Plan for Long Term Goals  Recommendations for other services: Neuropsych and Therapeutic Recreation  Stress management   Discharge Criteria: Patient will be discharged from OT if patient refuses treatment 3 consecutive times without medical reason, if treatment goals not  met, if there is a change in medical status, if patient makes no progress towards goals or if patient is discharged from hospital.  The above assessment, treatment plan, treatment alternatives and goals were discussed and mutually agreed upon: by patient  Valma Cava 04/20/2017, 4:04 PM

## 2017-04-21 ENCOUNTER — Inpatient Hospital Stay (HOSPITAL_COMMUNITY): Payer: BLUE CROSS/BLUE SHIELD | Admitting: Physical Therapy

## 2017-04-21 DIAGNOSIS — N390 Urinary tract infection, site not specified: Secondary | ICD-10-CM

## 2017-04-21 DIAGNOSIS — A499 Bacterial infection, unspecified: Secondary | ICD-10-CM

## 2017-04-21 DIAGNOSIS — G441 Vascular headache, not elsewhere classified: Secondary | ICD-10-CM

## 2017-04-21 DIAGNOSIS — D72828 Other elevated white blood cell count: Secondary | ICD-10-CM

## 2017-04-21 LAB — CBC
HEMATOCRIT: 35.7 % — AB (ref 36.0–46.0)
HEMOGLOBIN: 10.9 g/dL — AB (ref 12.0–15.0)
MCH: 25.3 pg — ABNORMAL LOW (ref 26.0–34.0)
MCHC: 30.5 g/dL (ref 30.0–36.0)
MCV: 83 fL (ref 78.0–100.0)
Platelets: 317 10*3/uL (ref 150–400)
RBC: 4.3 MIL/uL (ref 3.87–5.11)
RDW: 17 % — AB (ref 11.5–15.5)
WBC: 25.1 10*3/uL — AB (ref 4.0–10.5)

## 2017-04-21 MED ORDER — TRAMADOL HCL 50 MG PO TABS
50.0000 mg | ORAL_TABLET | Freq: Four times a day (QID) | ORAL | Status: DC | PRN
  Administered 2017-04-23 – 2017-05-01 (×13): 50 mg via ORAL
  Filled 2017-04-21 (×14): qty 1

## 2017-04-21 MED ORDER — TOPIRAMATE 25 MG PO TABS
25.0000 mg | ORAL_TABLET | Freq: Two times a day (BID) | ORAL | Status: DC
  Administered 2017-04-21 – 2017-04-22 (×3): 25 mg via ORAL
  Filled 2017-04-21 (×4): qty 1

## 2017-04-21 MED ORDER — AMOXICILLIN 500 MG PO CAPS
500.0000 mg | ORAL_CAPSULE | Freq: Three times a day (TID) | ORAL | Status: DC
  Administered 2017-04-21 – 2017-04-28 (×22): 500 mg via ORAL
  Filled 2017-04-21 (×22): qty 1

## 2017-04-21 MED ORDER — TOPIRAMATE 25 MG PO TABS
25.0000 mg | ORAL_TABLET | Freq: Once | ORAL | Status: AC
  Administered 2017-04-21: 25 mg via ORAL
  Filled 2017-04-21: qty 1

## 2017-04-21 MED ORDER — DEXAMETHASONE 4 MG PO TABS
4.0000 mg | ORAL_TABLET | Freq: Four times a day (QID) | ORAL | Status: DC
  Administered 2017-04-21 – 2017-04-22 (×6): 4 mg via ORAL
  Filled 2017-04-21 (×6): qty 1

## 2017-04-21 NOTE — Progress Notes (Addendum)
[facility] PHYSICAL MEDICINE & REHABILITATION     PROGRESS NOTE    Subjective/Complaints: Still having headaches. Says she was interrupted too much last night--didn't sleep as a result  ROS: pt denies nausea, vomiting, diarrhea, cough, shortness of breath or chest pain   Objective: Vital Signs: Blood pressure (!) 141/67, pulse 63, temperature 98.1 F (36.7 C), temperature source Oral, resp. rate 17, height 5\' 4"  (1.626 m), SpO2 99 %. No results found.  Recent Labs  04/20/17 0458 04/21/17 0500  WBC 20.5* 25.1*  HGB 11.0* 10.9*  HCT 35.9* 35.7*  PLT 329 317    Recent Labs  04/20/17 0458  NA 137  K 4.2  CL 101  GLUCOSE 114*  BUN 14  CREATININE 0.61  CALCIUM 9.1   CBG (last 3)  No results for input(s): GLUCAP in the last 72 hours.  Wt Readings from Last 3 Encounters:  04/07/17 65.8 kg (145 lb 1 oz)  02/08/12 51.7 kg (114 lb)  01/21/12 52.4 kg (115 lb 8.3 oz)    Physical Exam:  Constitutional: She appears well-developed. No distress.  HENT:  Mouth/Throat: Oropharynx is clear and moist.  Eyes:  Left peri-orbital ecchymosis, lid swelling  Neck: No tracheal deviationpresent. No thyromegalypresent.  Cardiovascular: Normal rate. Exam reveals no gallopand no friction rub.  No murmurheard. Respiratory: No respiratory distress. She has no wheezes. She has no rales.  GI: Soft. She exhibits no distension. There is no tenderness.  Neurological:  Right facial weakness is persistent. Speech clear. Horizontal nystagmus with visual deficits noted in superior and inferior visual field. Left lid ptosis. struggles with horizontal tracking as well as superiorly. Ataxia right upper and lower extremity. Right pronator drift. Strength 4/5 RUE prox to distal and RLE prox to distal. 4+ LUE and LLE. Fair insight and awareness. Follows commands. Provides biographical information. Psychiatric: She has a normal mood and affect. Her behavior is normal.  A little  disinhibited Skin: sutures/wounds clean on scalp  Assessment/Plan: 1. Functional, mobility deficits  secondary to [facility] which require 3+ hours per day of interdisciplinary therapy in a comprehensive inpatient rehab setting. Physiatrist is providing close team supervision and 24 hour management of active medical problems listed below. Physiatrist and rehab team continue to assess barriers to discharge/monitor patient progress toward functional and medical goals.  Function:  Bathing Bathing position   Position: Shower  Bathing parts Body parts bathed by patient: Right arm, Left arm, Chest, Abdomen, Front perineal area, Right upper leg, Left upper leg Body parts bathed by helper: Left lower leg, Right lower leg, Buttocks  Bathing assist Assist Level: Touching or steadying assistance(Pt > 75%)      Upper Body Dressing/Undressing Upper body dressing   What is the patient wearing?: Pull over shirt/dress     Pull over shirt/dress - Perfomed by patient: Thread/unthread right sleeve, Thread/unthread left sleeve Pull over shirt/dress - Perfomed by helper: Put head through opening, Pull shirt over trunk        Upper body assist Assist Level: Touching or steadying assistance(Pt > 75%)      Lower Body Dressing/Undressing Lower body dressing   What is the patient wearing?: Non-skid slipper socks, Pants, Underwear Underwear - Performed by patient: Thread/unthread right underwear leg, Thread/unthread left underwear leg Underwear - Performed by helper: Pull underwear up/down Pants- Performed by patient: Thread/unthread right pants leg, Thread/unthread left pants leg Pants- Performed by helper: Pull pants up/down   Non-skid slipper socks- Performed by helper: Don/doff left sock, Don/doff right sock  Lower body assist Assist for lower body dressing: Touching or steadying assistance (Pt > 75%)      Toileting Toileting   Toileting steps completed by patient: Performs  perineal hygiene Toileting steps completed by helper: Adjust clothing prior to toileting, Adjust clothing after toileting Toileting Assistive Devices: Other (comment)  Toileting assist Assist level: More than reasonable time, Set up/obtain supplies   Transfers Chair/bed transfer   Chair/bed transfer method: Squat pivot Chair/bed transfer assist level: Touching or steadying assistance (Pt > 75%) Chair/bed transfer assistive device: Armrests     Locomotion Ambulation     Max distance: 50 Assist level: Maximal assist (Pt 25 - 49%)   Wheelchair   Type: Manual Max wheelchair distance: 25 Assist Level: Supervision or verbal cues  Cognition Comprehension Comprehension assist level: Follows complex conversation/direction with extra time/assistive device  Expression Expression assist level: Expresses complex ideas: With extra time/assistive device  Social Interaction Social Interaction assist level: Interacts appropriately with others - No medications needed.  Problem Solving Problem solving assist level: Solves basic problems with no assist  Memory Memory assist level: Recognizes or recalls 75 - 89% of the time/requires cueing 10 - 24% of the time   Medical Problem List and Plan: 1. 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  -continue CIR therapies. Patient participating despite pain symptoms 2. DVT Prophylaxis/Anticoagulation: Mechanical: Sequential compression devices, below knee Bilateral lower extremities 3. Headaches/Pain Management: having constant, severe headache  -add prn tramadol  -topamax 25mg  qhs initiated at admission--increase to bid.  4. Mood: LCSW to follow for evaluation and support.  5. Neuropsych: This patient iscapable of making decisions on herown behalf. 6. Skin/Wound Care: Routine pressure relief measures.  7. Fluids/Electrolytes/Nutrition: Monitor I/O. Encourage PO  --potassium wnl  8. HTN:  nimotop taper for vasospasms  -prn clonidine  -some improvement  9. 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 ABLA: Hgb 11.0---no signs of blood loss  12. Leukocytosis: WBC's up to 25k today  -afebrile, lungs remain clear, wounds clean  -UA +, UCX pending---begin empiric amoxil  -steroid component to elevation as well.   -remove PICC  -follow up cbc in am .  13. Constipation: Augment bowel program.  LOS (Days) 2 A FACE TO FACE EVALUATION WAS PERFORMED  Ranelle OysterSWARTZ,Camille Thau T, MD 04/21/2017 8:52 AM

## 2017-04-21 NOTE — Progress Notes (Signed)
Physical Therapy Session Note  Patient Details  Name: [name] Barnett F Harb MRN: [id number] Date of Birth: 03/11/1976  Today's Date: 04/21/2017 PT Individual Time: 1300-1330 PT Individual Time Calculation (min): 30 min   Short Term Goals: Week 1:  PT Short Term Goal 1 (Week 1): Pt will increase bed mobility to S.  PT Short Term Goal 2 (Week 1): Pt will increase transfers to min A.  PT Short Term Goal 3 (Week 1): Pt will increase ambulation with LRAD to min A about 150 feet.  PT Short Term Goal 4 (Week 1): Pt will ascend/descend 4 stairs with B rails and min A PT Short Term Goal 5 (Week 1): Pt will propel w/c about 150 feet with S.   Skilled Therapeutic Interventions/Progress Updates:  Pt was seen bedside in the pm. Pt transferred supine to edge of bed with side rail, head of bed elevated and min A. Pt transferred stand pivot from edge of bed to w/c with min to mod A with verbal cues. Pt ambulated 100 feet with no assistive device, mod to max A with verbal cues. Treatment worked on sit to stand transfers. Pt performed sit tot stand transfers 3 sets x 5 reps each with min A and verbal cues. Pt left sitting up in w/c with quick release belt in place and call within reach.   Therapy Documentation Precautions:  Precautions Precautions: Fall Precaution Comments: Impulsive Restrictions Weight Bearing Restrictions: No General:   Vital Signs:  Pain: No c/o headache, c/o L hip pain with activity.   See Function Navigator for Current Functional Status.   Therapy/Group: Individual Therapy  Rayford HalstedMitchell, Othar Curto G 04/21/2017, 2:35 PM

## 2017-04-22 ENCOUNTER — Inpatient Hospital Stay (HOSPITAL_COMMUNITY): Payer: BLUE CROSS/BLUE SHIELD | Admitting: Physical Therapy

## 2017-04-22 ENCOUNTER — Inpatient Hospital Stay (HOSPITAL_COMMUNITY): Payer: BLUE CROSS/BLUE SHIELD | Admitting: Occupational Therapy

## 2017-04-22 ENCOUNTER — Encounter (HOSPITAL_COMMUNITY): Payer: Self-pay | Admitting: Neurosurgery

## 2017-04-22 ENCOUNTER — Inpatient Hospital Stay (HOSPITAL_COMMUNITY): Payer: BLUE CROSS/BLUE SHIELD | Admitting: Speech Pathology

## 2017-04-22 DIAGNOSIS — I69398 Other sequelae of cerebral infarction: Secondary | ICD-10-CM

## 2017-04-22 DIAGNOSIS — R269 Unspecified abnormalities of gait and mobility: Secondary | ICD-10-CM

## 2017-04-22 LAB — CBC
HCT: 35.8 % — ABNORMAL LOW (ref 36.0–46.0)
Hemoglobin: 11 g/dL — ABNORMAL LOW (ref 12.0–15.0)
MCH: 25.6 pg — ABNORMAL LOW (ref 26.0–34.0)
MCHC: 30.7 g/dL (ref 30.0–36.0)
MCV: 83.3 fL (ref 78.0–100.0)
PLATELETS: 339 10*3/uL (ref 150–400)
RBC: 4.3 MIL/uL (ref 3.87–5.11)
RDW: 17.3 % — ABNORMAL HIGH (ref 11.5–15.5)
WBC: 20.9 10*3/uL — AB (ref 4.0–10.5)

## 2017-04-22 MED ORDER — DEXAMETHASONE 4 MG PO TABS
4.0000 mg | ORAL_TABLET | Freq: Three times a day (TID) | ORAL | Status: AC
  Administered 2017-04-22 – 2017-04-23 (×5): 4 mg via ORAL
  Filled 2017-04-22 (×5): qty 1

## 2017-04-22 MED ORDER — DEXAMETHASONE 4 MG PO TABS
4.0000 mg | ORAL_TABLET | Freq: Every day | ORAL | Status: AC
  Administered 2017-04-26 – 2017-04-27 (×2): 4 mg via ORAL
  Filled 2017-04-22 (×2): qty 1

## 2017-04-22 MED ORDER — CLONIDINE HCL 0.1 MG PO TABS: 0.1000 mg | ORAL_TABLET | ORAL | Status: DC | PRN

## 2017-04-22 MED ORDER — DEXAMETHASONE 4 MG PO TABS
4.0000 mg | ORAL_TABLET | Freq: Two times a day (BID) | ORAL | Status: AC
  Administered 2017-04-24 – 2017-04-25 (×4): 4 mg via ORAL
  Filled 2017-04-22 (×4): qty 1

## 2017-04-22 NOTE — Progress Notes (Signed)
Patient information reviewed and entered into eRehab system by Latrail Pounders, RN, CRRN, PPS Coordinator.  Information including medical coding and functional independence measure will be reviewed and updated through discharge.    

## 2017-04-22 NOTE — Progress Notes (Signed)
[facility] PHYSICAL MEDICINE & REHABILITATION     PROGRESS NOTE    Subjective/Complaints: No issues overnight. Patient still with occasional headache and is unaware whether she has received any medications. She states she's not really sure what they give her.  ROS: pt denies nausea, vomiting, diarrhea, cough, shortness of breath or chest pain   Objective: Vital Signs: Blood pressure 118/68, pulse 69, temperature 97.8 F (36.6 C), temperature source Oral, resp. rate 18, height 5\' 4"  (1.626 m), SpO2 99 %. No results found.  Recent Labs  04/20/17 0458 04/21/17 0500  WBC 20.5* 25.1*  HGB 11.0* 10.9*  HCT 35.9* 35.7*  PLT 329 317    Recent Labs  04/20/17 0458  NA 137  K 4.2  CL 101  GLUCOSE 114*  BUN 14  CREATININE 0.61  CALCIUM 9.1   CBG (last 3)  No results for input(s): GLUCAP in the last 72 hours.  Wt Readings from Last 3 Encounters:  04/07/17 65.8 kg (145 lb 1 oz)  02/08/12 51.7 kg (114 lb)  01/21/12 52.4 kg (115 lb 8.3 oz)    Physical Exam:  Constitutional: She appears well-developed. No distress.  HENT:  Mouth/Throat: Oropharynx is clear and moist.  Eyes:  Left peri-orbital ecchymosis, lid swelling  Neck: No tracheal deviationpresent. No thyromegalypresent.  Cardiovascular: Normal rate. Exam reveals no gallopand no friction rub.  No murmurheard. Respiratory: No respiratory distress. She has no wheezes. She has no rales.  GI: Soft. She exhibits no distension. There is no tenderness.  Neurological:  Right facial weakness is persistent. Speech clear. Horizontal nystagmus with visual deficits noted in superior and inferior visual field. Left lid ptosis. struggles with horizontal tracking as well as superiorly. Ataxia right upper and lower extremity. Right pronator drift. Strength 4/5 RUE prox to distal and RLE prox to distal. 4+ LUE and LLE. Fair insight and awareness. Follows commands.  Psychiatric: She has a normal mood and affect. Her behavior  is normal.   Skin: sutures/wounds clean on scalp  Assessment/Plan: 1. Functional, mobility deficits  secondary to [facility] which require 3+ hours per day of interdisciplinary therapy in a comprehensive inpatient rehab setting. Physiatrist is providing close team supervision and 24 hour management of active medical problems listed below. Physiatrist and rehab team continue to assess barriers to discharge/monitor patient progress toward functional and medical goals.  Function:  Bathing Bathing position   Position: Shower  Bathing parts Body parts bathed by patient: Right arm, Left arm, Chest, Abdomen, Front perineal area, Right upper leg, Left upper leg Body parts bathed by helper: Left lower leg, Right lower leg, Buttocks  Bathing assist Assist Level: Touching or steadying assistance(Pt > 75%)      Upper Body Dressing/Undressing Upper body dressing   What is the patient wearing?: Pull over shirt/dress     Pull over shirt/dress - Perfomed by patient: Thread/unthread right sleeve, Thread/unthread left sleeve Pull over shirt/dress - Perfomed by helper: Put head through opening, Pull shirt over trunk        Upper body assist Assist Level: Touching or steadying assistance(Pt > 75%)      Lower Body Dressing/Undressing Lower body dressing   What is the patient wearing?: Non-skid slipper socks, Pants, Underwear Underwear - Performed by patient: Thread/unthread right underwear leg, Thread/unthread left underwear leg Underwear - Performed by helper: Pull underwear up/down Pants- Performed by patient: Thread/unthread right pants leg, Thread/unthread left pants leg Pants- Performed by helper: Pull pants up/down   Non-skid slipper socks- Performed by helper:  Don/doff left sock, Don/doff right sock                  Lower body assist Assist for lower body dressing: Touching or steadying assistance (Pt > 75%)      Toileting Toileting   Toileting steps completed by patient: Performs  perineal hygiene Toileting steps completed by helper: Adjust clothing prior to toileting, Adjust clothing after toileting Toileting Assistive Devices: Other (comment)  Toileting assist Assist level: Set up/obtain supplies, Touching or steadying assistance (Pt.75%)   Transfers Chair/bed transfer   Chair/bed transfer method: Squat pivot Chair/bed transfer assist level: Touching or steadying assistance (Pt > 75%) Chair/bed transfer assistive device: Armrests     Locomotion Ambulation     Max distance: 100 Assist level: Maximal assist (Pt 25 - 49%)   Wheelchair   Type: Manual Max wheelchair distance: 25 Assist Level: Supervision or verbal cues  Cognition Comprehension Comprehension assist level: Follows complex conversation/direction with extra time/assistive device  Expression Expression assist level: Expresses complex ideas: With extra time/assistive device  Social Interaction Social Interaction assist level: Interacts appropriately with others - No medications needed.  Problem Solving Problem solving assist level: Solves basic problems with no assist  Memory Memory assist level: Recognizes or recalls 75 - 89% of the time/requires cueing 10 - 24% of the time   Medical Problem List and Plan: 1. 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  -continue CIR therapies. Patient participating despite pain symptoms 2. DVT Prophylaxis/Anticoagulation: Mechanical: Sequential compression devices, below knee Bilateral lower extremities 3. Headaches/Pain Management: having constant, severe headache  -add prn tramadol  -topamax 25mg  qhs initiated at admission--increase to bid.  4. Mood: LCSW to follow for evaluation and support.  5. Neuropsych: This patient iscapable of making decisions on herown behalf. 6. Skin/Wound Care: Routine pressure relief measures.  7. Fluids/Electrolytes/Nutrition: Monitor I/O. Encourage PO  --potassium  wnl  8. HTN: nimotop taper for vasospasms  -prn clonidine  -some improvement  9. 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 ABLA: Hgb 11.0---no signs of blood loss  12. Leukocytosis: WBC's up to 25k today  -afebrile, lungs remain clear, wounds clean  -UA +, UCX pending---begin empiric amoxil  -steroid component to elevation as well.   -remove PICC  -follow up cbc still pending .  13. Constipation: Augment bowel program.  LOS (Days) 3 A FACE TO FACE EVALUATION WAS PERFORMED  Erick ColaceKIRSTEINS,Azeneth Carbonell E, MD 04/22/2017 9:53 AM

## 2017-04-22 NOTE — Progress Notes (Signed)
Physical Therapy Session Note  Patient Details  Name: [name] Barnett MRN: [id number] Date of Birth: 04/12/1976  Today's Date: 04/22/2017 PT Individual Time: 1300-1415 PT Individual Time Calculation (min): 75 min   Short Term Goals: Week 1:  PT Short Term Goal 1 (Week 1): Pt will increase bed mobility to S.  PT Short Term Goal 2 (Week 1): Pt will increase transfers to min A.  PT Short Term Goal 3 (Week 1): Pt will increase ambulation with LRAD to min A about 150 feet.  PT Short Term Goal 4 (Week 1): Pt will ascend/descend 4 stairs with B rails and min A PT Short Term Goal 5 (Week 1): Pt will propel w/c about 150 feet with S.   Skilled Therapeutic Interventions/Progress Updates:    no c/o pain at rest.  Session focus on NMR for motor planning and coordination.  Rest breaks provided throughout session for dizziness and fatigue.   Gait training with HHA x25' with max assist for weight shifting and upright posture with max multimodal cues for step length and heel strike.  Gait x25' with RW with min assist overall and improvement in ataxia, weight shifting, and posture.  Bed mobility on flat mat x2 throughout session with steady assist and verbal cues for accomodation to decrease dizziness.    NMR in hook lying with marching (2x8 focus on heel strike), and 2x8 bridging with adductor hold.  PT instructed pt in glute and lower torso rotation stretch 2x15 seconds each side.  NMR in standing with RW standing marching with min/mod assist focus on LLE placement.  Pt c/o pain in L SI joint with LLE weight bearing, PT applied 2 power strips of kinesiotape for decompression and pain control.    Pt returned to room at end of session, requesting to toilet.  Pt able to complete toilet transfer and 3/3 toileting steps with min assist.  PT switched pt w/c cushion for hybrid gel cushion for improved sitting tolerance.  Left upright in w/c with NT present for lunch.   Therapy Documentation Precautions:   Precautions Precautions: Fall Precaution Comments: Impulsive Restrictions Weight Bearing Restrictions: No   See Function Navigator for Current Functional Status.   Therapy/Group: Individual Therapy  Ladora DanielCaitlin E Penven-Crew 04/22/2017, 2:57 PM

## 2017-04-22 NOTE — Progress Notes (Signed)
Speech Language Pathology Daily Session Note  Patient Details  Name: [name] Barnett MRN: [id number] Date of Birth: 02/15/1976  Today's Date: 04/22/2017 SLP Individual Time: 1000-1100 SLP Individual Time Calculation (min): 60 min  Short Term Goals: Week 1: SLP Short Term Goal 1 (Week 1): Patient will consume current diet without overt s/s of aspiration and supervision verbal cues for use of cin tuck. SLP Short Term Goal 2 (Week 1): Patient will recall new, daily information with Mod I.  SLP Short Term Goal 3 (Week 1): Patient will demonstrate complex problem solving with Mod I.  SLP Short Term Goal 4 (Week 1): Patient will identify activities that she will need assistance with at discharge with supervision verbal cues.  SLP Short Term Goal 5 (Week 1): Patient will attend to LUE during functional tasks with Min A verbal cues.   Skilled Therapeutic Interventions: Skilled treatment session focused on addressing cognition goals.  Upon SLP arrival patient reported that she was doing well but could not see anything.  Upon further questioning patient able to see shapes and colors but they were blurry.  Patient also able to complete basic money management task by sorting and counting change with Min assist question cues to utilize tactile cues to assist with differentiating between nickel and quarter x1, other wise patient 100% accurate.  Patient Mod I for counting accuracy.  SLP also facilitated session with color block sorting and matching task which patient completed with Min question cues to differentiate between colors that were similar such as blue and purple and black and brown.  Patient completed task with use of both upper extremities with Mod assist multimodal cues to attend to and monitor left upper extremity.  Continue with current plan of care.    Function:  Cognition Comprehension Comprehension assist level: Follows complex conversation/direction with extra time/assistive device   Expression   Expression assist level: Expresses complex ideas: With extra time/assistive device  Social Interaction Social Interaction assist level: Interacts appropriately with others with medication or extra time (anti-anxiety, antidepressant).  Problem Solving Problem solving assist level: Solves basic 75 - 89% of the time/requires cueing 10 - 24% of the time  Memory Memory assist level: Recognizes or recalls 75 - 89% of the time/requires cueing 10 - 24% of the time    Pain Pain Assessment Pain Assessment: No/denies pain  Therapy/Group: Individual Therapy  Charlane FerrettiMelissa Jalexa Pifer, M.A., CCC-SLP [phone]  [name] Barnett 04/22/2017, 3:58 PM

## 2017-04-22 NOTE — Progress Notes (Signed)
Ranelle Oyster, MD Physician Signed Physical Medicine and Rehabilitation  Consult Note Date of Service: 04/16/2017 12:59 PM  Related encounter: ED to Hosp-Admission (Discharged) from 04/07/2017 in [facility] NEURO/TRAUMA/SURGICAL ICU     Expand All Collapse All   [] Hide copied text [] Hover for attribution information      Physical Medicine and Rehabilitation Consult   Reason for Consult: Ruptured SAH Referring Physician:  Dr. Conchita Paris   HPI: [name] Barnett is a 41 y.o. female with history of HTN, GERD who was admitted on 04/07/17 with worsening of HA, episode of emesis followed by LOC with fall and LLE weakness. 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 She was taken to OR by Dr. 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 EVD placed at bedside with improvement in LOC and IS used to help improve low lung volumes. Patient with resultant balance deficits, left gaze palsy with decreased vision Left > Right as well as headaches .  EVD clamped today and CT head to be repeated tomorrow. MBS done due to reports of coughing with liquids and chin tuck recommended with liquids to prevent aspiration. Therapy ongoing and patient with   E She was     Review of Systems  HENT: Negative for hearing loss.   Eyes: Positive for blurred vision. Negative for pain.  Respiratory: Negative for cough and shortness of breath.   Cardiovascular: Negative for chest pain, palpitations and leg swelling.  Gastrointestinal: Positive for constipation. Negative for heartburn and nausea.       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  Skin: Negative for itching and rash.  Neurological: Positive for dizziness, speech change, focal weakness, weakness and headaches.  Psychiatric/Behavioral: Negative for depression. The patient is not nervous/anxious.        Past Medical History:  Diagnosis Date  . Duodenal obstruction   . GERD (gastroesophageal reflux disease)   . Hypertension         Past Surgical History:  Procedure Laterality Date  . BALLOON DILATION  12/31/2011   Procedure: BALLOON DILATION;  Surgeon: Barrie Folk, MD;  Location: [facility] ENDOSCOPY;  Service: Endoscopy;  Laterality: N/A;  . CHOLECYSTECTOMY  07/28/11  . ESOPHAGOGASTRODUODENOSCOPY  10/17/2011   Procedure: ESOPHAGOGASTRODUODENOSCOPY (EGD);  Surgeon: Barrie Folk, MD;  Location: [facility] ENDOSCOPY;  Service: Endoscopy;  Laterality: N/A;  . RADIOLOGY WITH ANESTHESIA N/A 04/07/2017   Procedure: RADIOLOGY WITH ANESTHESIA;  Surgeon: Lisbeth Renshaw, MD;  Location: [facility] OR;  Service: Radiology;  Laterality: N/A;  . TEMPOROMANDIBULAR JOINT SURGERY     2 surgeries  . VAGOTOMY  01/23/2012   Procedure: VAGOTOMY, antrectomy and BII;  Surgeon: Currie Paris, MD;  Location: [facility] OR;  Service: General;  Laterality: N/A;  Laparotomy with vagotomy.         Family History  Problem Relation Age of Onset  . Malignant hyperthermia Neg Hx     Social History: Married. Husband works nights for [employer]. Works in an office. She reports that she has been smoking Cigarettes  1- 1.5 PPD depending on stress.  She has a 20.00 pack-year smoking history. She has  never used smokeless tobacco. She drinks 10 mixed drinks (T&T) on the weekends. She does not use drugs.         Allergies  Allergen Reactions  . 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 ibuprofen (ADVIL,MOTRIN) 200 MG tablet Take  200 mg by mouth every 6 (six) hours as needed for moderate pain.    . feeding supplement (RESOURCE BREEZE) LIQD Take 1 Container by mouth 2 (two) times daily with a meal. (Patient not taking: Reported on 04/07/2017)    . pantoprazole (PROTONIX) 40 MG tablet Take 1 tablet (40 mg total) by mouth 2 (two) times daily before a meal. 60 tablet 1    Home: Home Living Family/patient expects to be discharged to:: Private residence Living Arrangements: Spouse/significant other, Children Available Help at Discharge: Family, Available 24 hours/day Type of Home: House Home Access: Stairs to enter Entergy CorporationEntrance Stairs-Number of Steps: 3 Entrance Stairs-Rails: Can reach both Home Layout: One level Bathroom Shower/Tub: Walk-in shower (and Secondary school teacherjacuzzi tub) Bathroom Toilet: Standard Home Equipment: Crutches Additional Comments: Husband works night; mother and mother-in-law live nearby and are able to help if needed.  Functional History: Prior Function Level of Independence: Independent Comments: Works as Airline pilotaccountant; drives Functional Status:  Mobility: Bed Mobility Overal bed mobility: Needs Assistance Bed Mobility: Supine to Sit Supine to sit: Min assist, HOB elevated Sit to supine: Supervision General bed mobility comments: pt impulsive with movement- truncal ataxia present getting to EOB, assist for steadying.  Transfers Overall transfer level: Needs assistance Equipment used: 1 person hand held assist Transfers: Sit to/from Stand, Stand Pivot Transfers Sit to Stand: Min guard, +2 safety/equipment Stand pivot transfers: Min assist, +2 safety/equipment General transfer comment: Impulsively standing from EOB x1, from chair x1 with truncal ataxia present; assist of 2 for safety with lines. SPT to chair Min A for balance due to LE ataxia. Stood from chair x3. Ambulation/Gait Ambulation/Gait assistance: Mod assist, +2 safety/equipment Ambulation Distance (Feet): 16 Feet (x3 bouts) Assistive device:  1 person hand held assist (rail for support) Gait Pattern/deviations: Step-to pattern, Step-through pattern, Decreased step length - right, Decreased step length - left, Ataxic, Staggering right, Staggering left, Narrow base of support, Decreased dorsiflexion - left, Decreased dorsiflexion - right General Gait Details: Using rail and HHA for support; initially step to gait progressing to step through with increased ataxia BLEs. Bil forefoot contact with gross overstepping of LLE. LOB x2 towards left. Difficulty with visual deficits. 3 seated rest breaks. Able to obtain midline most of time today. Gait velocity: Decreased Gait velocity interpretation: Below normal speed for age/gender  ADL: ADL Overall ADL's : Needs assistance/impaired Eating/Feeding: Set up (sitting up in bed) Grooming: Minimal assistance Grooming Details (indicate cue type and reason): Sitting up in bed; Dizziness impacting ability to sit without back support. Upper Body Bathing: Minimal assistance, Sitting (with back support) Lower Body Bathing: Maximal assistance, Sit to/from stand Lower Body Bathing Details (indicate cue type and reason): Due to dizziness Upper Body Dressing : Minimal assistance, Sitting (with back support) Lower Body Dressing: Supervision/safety, Sitting/lateral leans Lower Body Dressing Details (indicate cue type and reason): able to don socks sitting in bed Toilet Transfer: +2 for safety/equipment, Moderate assistance, Ambulation (using hallway rail) Toilet Transfer Details (indicate cue type and reason): One person handheld assist Toileting- Clothing Manipulation and Hygiene: Maximal assistance, Sit to/from stand Functional  mobility during ADLs: Moderate assistance, +2 for physical assistance, Cueing for safety, Cueing for sequencing, Rolling walker General ADL Comments: L UE ataxic with movement and pt with poor spatial awareness and depth perception impacting function with L UE  reaching.  Cognition: Cognition Overall Cognitive Status: Impaired/Different from baseline Arousal/Alertness: Awake/alert Orientation Level: Oriented X4 Attention: Alternating Alternating Attention: Impaired Alternating Attention Impairment: Functional basic, Verbal basic Memory: Impaired Memory Impairment: Decreased recall of new information, Retrieval deficit Awareness: Impaired Awareness Impairment: Anticipatory impairment Problem Solving: Appears intact Safety/Judgment: Impaired Cognition Arousal/Alertness: Awake/alert Behavior During Therapy: WFL for tasks assessed/performed, Impulsive Overall Cognitive Status: Impaired/Different from baseline Area of Impairment: Safety/judgement, Attention Current Attention Level: Selective Safety/Judgement: Decreased awareness of safety Problem Solving: Requires verbal cues, Requires tactile cues General Comments: Impulsive with mobility and aware of deficits mostly. VC's for safety throughout.  Blood pressure (!) 147/71, pulse (!) 58, temperature 98.4 F (36.9 C), temperature source Oral, resp. rate 18, height 5\' 4"  (1.626 m), weight 65.8 kg (145 lb 1 oz), SpO2 98 %. Physical Exam  Nursing note and vitals reviewed. Constitutional: She is oriented to person, place, and time. She appears well-developed and well-nourished.  HENT:  Head: Normocephalic.  Mouth/Throat: Oropharynx is clear and moist.  Eyes: Left conjunctiva has a hemorrhage. Right eye exhibits nystagmus. Left eye exhibits abnormal extraocular motion and nystagmus. Left pupil is not reactive.  Left peri-orbital ecchymosis with dysconjugate gaze. Left eye with minimal movement.   Neck: Normal range of motion. Neck supple.  Cardiovascular: Normal rate and regular rhythm.   Respiratory: Effort normal and breath sounds normal. No stridor. No respiratory distress. She has no wheezes.  GI: Soft. Bowel sounds are normal. She exhibits no distension. There is no tenderness.   Musculoskeletal: She exhibits no edema.  Bilateral hip discomfort with SLR.   Neurological: She is alert and oriented to person, place, and time. A cranial nerve deficit is present. Coordination abnormal.  Right  facial weakness. Speech clear. Horizontal nystagmus with visual deficits in superior and inferior visual field. Has difficulty with horizontal tracking as well.  Also has some difficulty tracking superiorly. Ataxia right upper and lower extremity. Right pronator drift. Strength 4/5 RUE and RLE. 4+ LUE and LLE. Fair insight and awareness    Skin: Skin is warm and dry.  Psychiatric: She has a normal mood and affect. Her behavior is normal. Thought content normal.  A bit impulsive and disinhibited    Lab Results Last 24 Hours  No results found for this or any previous visit (from the past 24 hour(s)).   Imaging Results (Last 48 hours)  No results found.    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 Does the need for close, 24 hr/day medical supervision in concert with the patient's rehab needs make it unreasonable for this patient to be served in a less intensive setting? Yes 2. Co-Morbidities requiring supervision/potential complications: htn, 3. Due to bladder management, bowel management, safety, skin/wound care, disease management, medication administration, pain management and patient education, does the patient require 24 hr/day rehab nursing? Yes 4. Does the patient require coordinated care of a physician, rehab nurse, PT (1-2 hrs/day, 5 days/week) and OT (1-2 hrs/day, 5 days/week) to address physical and functional deficits in the context of the above medical diagnosis(es)? Yes Addressing deficits in the following areas: balance, endurance, locomotion, strength, transferring, bowel/bladder control, bathing, dressing, feeding, grooming, toileting and psychosocial support 5. Can the patient actively  participate in an  intensive therapy program of at least 3 hrs of therapy per day at least 5 days per week? Yes 6. The potential for patient to make measurable gains while on inpatient rehab is excellent 7. Anticipated functional outcomes upon discharge from inpatient rehab are modified independent and supervision  with PT, modified independent and supervision with OT, n/a with SLP. 8. Estimated rehab length of stay to reach the above functional goals is: 9-12 days 9. Does the patient have adequate social supports and living environment to accommodate these discharge functional goals? Yes 10. Anticipated D/C setting: Home 11. Anticipated post D/C treatments: HH therapy and Outpatient therapy 12. Overall Rehab/Functional Prognosis: excellent  RECOMMENDATIONS: This patient's condition is appropriate for continued rehabilitative care in the following setting: CIR Patient has agreed to participate in recommended program. Yes Note that insurance prior authorization may be required for reimbursement for recommended care.  Comment: Pt was active and independent prior to admit.  Rehab Admissions Coordinator to follow up.  Thanks,  Ranelle Oyster, MD, Earlie Counts, PA-C 04/16/2017    Revision History                        Routing History

## 2017-04-22 NOTE — IPOC Note (Addendum)
Overall Plan of Care [facility](IPOC) Patient Details Name: [name] Barnett MRN: [id number] DOB: 04/09/1976  [facility]  [facility] Problems: Active Problems:   SAH (subarachnoid hemorrhage) ([name])     Functional Problem List: Nursing Bladder, Bowel, Endurance, Medication Management, Pain, Nutrition, Perception, Motor, Safety, Sensory  PT Behavior, Motor, Perception, Balance, Endurance  OT Balance, Cognition, Endurance, Motor, Perception, Safety, Vision  SLP Cognition, Behavior  TR         Basic ADL's: OT Eating, Grooming, Bathing, Dressing, Toileting     Advanced  ADL's: OT       Transfers: PT Bed Mobility, Bed to Chair, Customer service managerCar  OT Toilet, Tub/Shower     Locomotion: PT Ambulation, Psychologist, prison and probation servicesWheelchair Mobility, Stairs     Additional Impairments: OT Fuctional Use of Upper Extremity  SLP Social Cognition, Swallowing   Problem Solving, Memory, Awareness  TR      Anticipated Outcomes Item Anticipated Outcome  Self Feeding Mod I  Swallowing  Mod I   Basic self-care  Mod I  Toileting  Mod I   Bathroom Transfers Mod I  Bowel/Bladder  min assist  Transfers  mod I   Locomotion  S gait, S stairs, mod I w/c mobility  Communication     Cognition  Mod I   Pain  3 or less  Safety/Judgment  min assist   Therapy Plan: PT Intensity: Minimum of 1-2 x/day ,45 to 90 minutes PT Frequency: 5 out of 7 days PT Duration Estimated Length of Stay: 16 to 18 days OT Intensity: Minimum of 1-2 x/day, 45 to 90 minutes OT Frequency: 5 out of 7 days OT Duration/Estimated Length of Stay: 2 weeks SLP Intensity: Minumum of 1-2 x/day, 30 to 90 minutes SLP Frequency: 3 to 5 out of 7 days SLP Duration/Estimated Length of Stay: 12-14 days        Team Interventions: Nursing Interventions Patient/Family Education, Bladder Management, Bowel Management, Disease Management/Prevention, Pain Management, Medication Management, Cognitive Remediation/Compensation, Skin Care/Wound Management, Discharge  Planning  PT interventions Ambulation/gait training, Balance/vestibular training, Discharge planning, DME/adaptive equipment instruction, Neuromuscular re-education, Functional mobility training, Patient/family education, Splinting/orthotics, Stair training, Therapeutic Activities, Therapeutic Exercise, UE/LE Strength taining/ROM, UE/LE Coordination activities, Visual/perceptual remediation/compensation, Wheelchair propulsion/positioning  OT Interventions Warden/rangerBalance/vestibular training, Cognitive remediation/compensation, Community reintegration, Discharge planning, DME/adaptive equipment instruction, Functional mobility training, Neuromuscular re-education, Patient/family education, Pain management, Psychosocial support, Self Care/advanced ADL retraining, Therapeutic Activities, UE/LE Strength taining/ROM, Therapeutic Exercise, UE/LE Coordination activities, Visual/perceptual remediation/compensation, Wheelchair propulsion/positioning  SLP Interventions Cognitive remediation/compensation, Dysphagia/aspiration precaution training, Financial traderCueing hierarchy, Internal/external aids, Functional tasks, Patient/family education, Therapeutic Activities, Environmental controls  TR Interventions    SW/CM Interventions Psychosocial Support, Discharge Planning, Patient/Family Education    Team Discharge Planning: Destination: PT-Home ,OT- Home , SLP-Home Projected Follow-up: PT-Home health PT, OT-  Home health OT, SLP- (TBD) Projected Equipment Needs: PT-To be determined, OT- To be determined, SLP-None recommended by SLP Equipment Details: PT- , OT-  Patient/family involved in discharge planning: PT- Patient, Family member/caregiver,  OT-Patient, SLP-Patient  MD ELOS: 12-16d Medical Rehab Prognosis:  Good Assessment:  41 y.o.femalewith history of HTN, GERD who was admitted on 04/07/17 with worsening of HA, episode of emesis followed by LOC with fall and LLE weakness. 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 She was taken to OR by Dr. Conchita ParisNundkumar and underwent coil embolization of basilar artery aneurysm.[name]. 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 EVD placed at bedside with improvement in LOC and IS used to help improve low lung volumes. Patient with resultant balance deficits, left gaze palsy with decreased vision Left >Right as well as persistentheadaches . 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 EVD removed 5/10  MBS done due to reports of coughing with liquids and chin tuck recommended with liquids to prevent aspiration. She has had reports of changes in vision and opthalmology evaluation showed evidence of bilateral preretinal/vitreous hemorrhage associated with SAH. Dr. Genia Del indicates that vitreous hemorrhage should resolve over next several weeks and no signs of retinal tear or detachment--call for worsening of vision/  Therapy ongoing and patient with ataxic gait with balance deficits, tendency to lean to the left, decrease in visual acuity as well as proprioceptive deficits   Now requiring 24/7 Rehab RN,MD, as well as CIR level PT, OT and SLP.  Treatment team will focus on ADLs and mobility with goals set at Mod I See Team Conference Notes for weekly updates to the plan of care

## 2017-04-22 NOTE — Progress Notes (Signed)
Trish Mage, RN Rehab Admission Coordinator Signed Physical Medicine and Rehabilitation  PMR Pre-admission Date of Service: 04/18/2017 4:56 PM  Related encounter: ED to Hosp-Admission (Discharged) from 04/07/2017 in [facility] NEURO/TRAUMA/SURGICAL ICU       [] Hide copied text PMR Admission Coordinator Pre-Admission Assessment  Patient: [name] Barnett is an 41 y.o., female MRN: [id number] DOB: Jun 23, 1976 Height: 5\' 4"  (162.6 cm) Weight: 65.8 kg (145 lb 1 oz)                                                                                                                                                  Insurance Information HMO:     PPO: yes     PCP:      IPA:      80/20:      OTHER:  PRIMARY: [employer] [place]      Policy#: [id number]      Subscriber: pt CM Name: Hasus     Phone#: [phone]     Fax#: [phone] Pre-Cert#: [id number]  Update needed in 7 days from admit    Employer: [employer] Benefits:  Phone #: [phone]     Name: 04/17/17 Eff. 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 She was taken to OR by Dr. 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 EVD placed at bedside with improvement in LOC and IS used to help improve low lung volumes. Patient with resultant balance deficits, left gaze palsy with decreased vision  Left >Right as well as headaches .  EVD clamped today and CT head to be repeated tomorrow. MBS done due to reports of coughing with liquids and chin tuck recommended with liquids to prevent aspiration. Therapy ongoing and patient with ataxic gait with balance deficits, tendency to lean to the left, decrease in visual acuity as well as proprioceptive deficits.   Total: 3 NIH  Past Medical History      Past Medical History:  Diagnosis Date  . Duodenal obstruction   . GERD (gastroesophageal reflux disease)   . Hypertension     Family History  family history is not on file.  Prior Rehab/Hospitalizations:  Has the patient had major surgery during 100 days prior to admission? No  Current Medications   Current Facility-Administered Medications:  .   stroke: mapping our early stages of recovery book, , Does not apply, Once, Costella, Vincent J, PA-C .  0.9 %  sodium chloride infusion, , Intravenous, Continuous, Nundkumar, Marlane Hatcher, MD, Last Rate: 75 mL/hr at 04/19/17 1000 .  0.9 %  sodium chloride infusion, , Intravenous, Once, Claudina Lick, CRNA .  acetaminophen (TYLENOL) tablet 650 mg, 650 mg, Oral, Q4H PRN, 650 mg at 04/19/17 1241 **OR** acetaminophen (TYLENOL) solution 650 mg, 650 mg, Per Tube, Q4H PRN **OR** acetaminophen (TYLENOL) suppository 650 mg, 650 mg, Rectal, Q4H PRN, Costella, Darci Current, PA-C .  butalbital-acetaminophen-caffeine (FIORICET, ESGIC) 50-325-40 MG per tablet 1-2  tablet, 1-2 tablet, Oral, Q6H PRN, Lisbeth Renshaw, MD, 1 tablet at 04/19/17 0933 .  Chlorhexidine Gluconate Cloth 2 % PADS 6 each, 6 each, Topical, Daily, Lisbeth Renshaw, MD, 6 each at 04/18/17 1656 .  dexamethasone (DECADRON) injection 4 mg, 4 mg, Intravenous, Q6H, Lisbeth Renshaw, MD, 4 mg at 04/19/17 1149 .  docusate sodium (COLACE) capsule 100 mg, 100 mg, Oral, BID, Costella, Vincent J, PA-C, 100 mg at 04/19/17 0933 .  labetalol (NORMODYNE,TRANDATE) injection 10 mg, 10 mg, Intravenous, Q2H PRN, Nundkumar, Neelesh, MD .  lidocaine (LIDODERM) 5 % 1 patch, 1 patch, Transdermal, Q24H, Alyson Reedy, MD, 1 patch at 04/19/17 1154 .  nicardipine (CARDENE) 20mg  in 0.86% saline IV infusion (0.1 mg/ml), 3-15 mg/hr, Intravenous, Continuous, Lisbeth Renshaw, MD, Stopped at 04/08/17 1855 .  niMODipine (NIMOTOP) capsule 60 mg, 60 mg, Oral, Q4H, 60 mg at 04/19/17 1149 **OR** NiMODipine (NYMALIZE) 60 MG/20ML oral solution 60 mg, 60 mg, Per Tube, Q4H, Costella, Vincent J, PA-C, 60 mg at 04/08/17 0935 .  ondansetron (ZOFRAN-ODT) disintegrating tablet 4 mg, 4 mg, Oral, Q6H PRN **OR** ondansetron (ZOFRAN) injection 4 mg, 4 mg, Intravenous, Q6H PRN, Costella, Vincent J, PA-C, 4 mg at 04/07/17 1820 .  pantoprazole (PROTONIX) EC tablet 40 mg, 40 mg, Oral, Daily, 40 mg at 04/19/17 0933 **OR** pantoprazole sodium (PROTONIX) 40 mg/20 mL oral suspension 40 mg, 40 mg, Per Tube, Daily, Costella, Vincent J, PA-C .  RESOURCE THICKENUP CLEAR, , Oral, PRN, Lisbeth Renshaw, MD .  sodium chloride flush (NS) 0.9 % injection 10-40 mL, 10-40 mL, Intracatheter, Q12H, Lisbeth Renshaw, MD, 20 mL at 04/19/17 0800 .  sodium chloride flush (NS) 0.9 % injection 10-40 mL, 10-40 mL, Intracatheter, PRN, Lisbeth Renshaw, MD, 20 mL at 04/19/17 1610  Patients Current Diet: Diet regular Room service appropriate? Yes; Fluid consistency: Thin Diet - low sodium heart healthy  Precautions /  Restrictions Precautions Precautions: Fall Precaution Comments: EVD Restrictions Weight Bearing Restrictions: No   Has the patient had 2 or more falls or a fall with injury in the past year?No  Prior Activity Level Community (5-7x/wk):  driving and working for [employer]  Home Assistive Devices / Equipment Home Assistive Devices/Equipment: None Home Equipment: Crutches  Prior Device Use: Indicate devices/aids used by the patient prior to current illness, exacerbation or injury? None of the above  Prior Functional Level Prior Function Level of Independence: Independent Comments: Works as Airline pilotaccountant; drives  Self Care: Did the patient need help bathing, dressing, using the toilet or eating?  Independent  Indoor Mobility: Did the patient need assistance with walking from room to room (with or without device)? Independent  Stairs: Did the patient need assistance with internal or external stairs (with or without device)? Independent  Functional Cognition: Did the patient need help planning regular tasks such as shopping or remembering to take medications? Independent  Current Functional Level Cognition  Arousal/Alertness: Awake/alert Overall Cognitive Status: Impaired/Different from baseline Current Attention Level: Selective Orientation Level: Oriented X4 Safety/Judgement: Decreased awareness of deficits, Decreased awareness of safety General Comments: Pt is very impulsive.  Decreased awareness of severity of impairments and impact on function  Attention: Alternating Alternating Attention: Impaired Alternating Attention Impairment: Functional basic, Verbal basic Memory: Impaired Memory Impairment: Decreased recall of new information, Retrieval deficit Awareness: Impaired Awareness Impairment: Anticipatory impairment Problem Solving: Appears intact Safety/Judgment: Impaired    Extremity Assessment (includes Sensation/Coordination)  Upper Extremity  Assessment: Generalized weakness  Lower Extremity Assessment: Generalized weakness    ADLs  Overall ADL's : Needs assistance/impaired Eating/Feeding: Set up (sitting up in bed) Grooming: Minimal assistance Grooming Details (indicate cue type and reason): Sitting up in bed; Dizziness impacting ability to sit without back support. Upper Body Bathing: Minimal assistance, Sitting (with back support) Lower Body Bathing: Maximal assistance, Sit to/from stand Lower Body Bathing Details (indicate cue type and reason): Due to dizziness Upper Body Dressing : Minimal assistance, Sitting (with back support) Lower Body Dressing: Supervision/safety, Sitting/lateral leans Lower Body Dressing Details (indicate cue type and reason): able to don socks sitting in bed Toilet Transfer: Moderate assistance, +2 for physical assistance, +2 for safety/equipment Toilet Transfer Details (indicate cue type and reason): Pt initially requiring min A, but progressed to mod A as she fatigued  Toileting- Clothing Manipulation and Hygiene: Maximal assistance, Sit to/from stand Functional mobility during ADLs: Moderate assistance, +2 for physical assistance, +2 for safety/equipment General ADL Comments: L UE ataxic with movement and pt with poor spatial awareness and depth perception impacting function with L UE reaching.    Mobility  Overal bed mobility: Needs Assistance Bed Mobility: Supine to Sit Supine to sit: Min guard, HOB elevated Sit to supine: Supervision General bed mobility comments: supervision for safety due to impulsivity     Transfers  Overall transfer level: Needs assistance Equipment used: 1 person hand held assist Transfers: Sit to/from Stand, Stand Pivot Transfers Sit to Stand: Min assist, +2 safety/equipment Stand pivot transfers: Min assist, Mod assist General transfer comment: with bil. hands on bil. Knees, worked on sit to stand with controlled trunk and balance.  She initially required  min A, but progressed to mod A as she fatigued.  Heavy lean to Lt as she fatigues      Ambulation / Gait / Stairs / Wheelchair Mobility  Ambulation/Gait Ambulation/Gait assistance: Mod assist, +2 safety/equipment Ambulation Distance (Feet): 10 Feet (+ 15') Assistive device: 2 person hand held assist Gait Pattern/deviations: Step-through pattern, Decreased stance time - left, Decreased step length - right, Narrow base of support, Ataxic, Leaning posteriorly, Staggering right, Staggering left General Gait Details: Pt with bil hands on shoulders of therapist,  cues to decrease speed, assist with weight shifting to advance RLE; tactile and verbal cues to activate left glutes to stabilize during stance phase. posterior lean and lean to the left worsened with fatigue. 1 seated rest break. Gait velocity: Decreased Gait velocity interpretation: Below normal speed for age/gender    Posture / Balance Dynamic Sitting Balance Sitting balance - Comments: requires bil. UE support.  With UEs supported on bil. knees, she was able to maintain static EOC sitting with min guard assist - min A.  loses balance to Lt at times requiring physical assist to recover. Pt with poor proprioceptive awareness  Balance Overall balance assessment: Needs assistance Sitting-balance support: Feet supported, No upper extremity supported Sitting balance-Leahy Scale: Poor Sitting balance - Comments: requires bil. UE support.  With UEs supported on bil. knees, she was able to maintain static EOC sitting with min guard assist - min A.  loses balance to Lt at times requiring physical assist to recover. Pt with poor proprioceptive awareness  Standing balance support: During functional activity, Single extremity supported, Bilateral upper extremity supported Standing balance-Leahy Scale: Poor Standing balance comment: With bil. UEs supported (closed chain), pt able to stand with min A - max A.  Status variable throughout session  depending on fatigue level and distraction.  Pt with poor proprioceptive awareness which appears worse on the Lt  facilitation provided Lt scap/shoulder, Lt hip and trunk     Special needs/care consideration BiPAP/CPAP  N/a CPM  N/a Continuous Drip IV  N/a Dialysis  N/a Life Vest  N/a Oxygen N/a Special Bed  N/a Trach Size  N/a Wound Vac (area)  N/a Skin ventric site, ecchymosis left arm and eye and chest                              Bowel mgmt: LBM 4/29  Bladder mgmt: indwelling catheter Diabetic mgmt  N/a   Previous Home Environment Living Arrangements: Spouse/significant other, Children  Lives With: Spouse, Daughter (59 yo daughter and 33 yo son) Available Help at Discharge: Family, Available 24 hours/day Type of Home: House Home Layout: One level Home Access: Stairs to enter Entrance Stairs-Rails: Can reach both Entrance Stairs-Number of Steps: 3 Bathroom Shower/Tub: Health visitor: Standard Bathroom Accessibility: Yes How Accessible: Accessible via walker Home Care Services: No Additional Comments: Husband works night; mother and mother-in-law live nearby and are able to help if needed.  Discharge Living Setting Plans for Discharge Living Setting: Patient's home, Lives with (comment) (spouse and 13 yo daughter; 42 yo son does not live with them) Type of Home at Discharge: House Discharge Home Layout: One level Discharge Home Access: Stairs to enter Entrance Stairs-Rails: Right, Left, Can reach both Entrance Stairs-Number of Steps: 3 Discharge Bathroom Shower/Tub: Walk-in shower Discharge Bathroom Toilet: Standard Discharge Bathroom Accessibility: Yes How Accessible: Accessible via walker Does the patient have any problems obtaining your medications?: No  Social/Family/Support Systems Patient Roles: Spouse, Parent (employee) Contact Information: Financial trader, spouse Anticipated Caregiver: spouse, Mom and mother in law Anticipated Caregiver's Contact  Information: see above Ability/Limitations of Caregiver: spouse works part time nights for [employer] Caregiver Availability: 24/7 Discharge Plan Discussed with Primary Caregiver: Yes Is Caregiver In Agreement with Plan?: Yes Does Caregiver/Family have Issues with Lodging/Transportation while Pt is in Rehab?: No  Goals/Additional Needs Patient/Family Goal for Rehab: Mod I with PT, OT, and SLP Expected length of stay: ELOS 9- 12 days Pt/Family Agrees to Admission and willing  to participate: Yes Program Orientation Provided & Reviewed with Pt/Caregiver Including Roles  & Responsibilities: Yes  Decrease burden of Care through IP rehab admission: n/a  Possible need for SNF placement upon discharge:not anticipated  Patient Condition: This patient's medical and functional status has changed since the consult dated: 04/16/2017 in which the Rehabilitation Physician determined and documented that the patient's condition is appropriate for intensive rehabilitative care in an inpatient rehabilitation facility. See "History of Present Illness" (above) for medical update. Functional changes are: Currently requiring mod assist +2 to ambulate 10 feet +2 HHA. Patient's medical and functional status update has been discussed with the Rehabilitation physician and patient remains appropriate for inpatient rehabilitation. Will admit to inpatient rehab today.  Preadmission Screen Completed By:  Clois Dupes, 04/19/2017 12:45 PM with updates by Lelon Frohlich 04/19/17 ______________________________________________________________________   Discussed status with Dr. Riley Kill on 04/19/17 at 1244 and received telephone approval for admission today.  Admission Coordinator:  Trish Mage, time 1245/Date 04/19/17       Cosigned by: Ranelle Oyster, MD at 04/19/2017 1:23 PM  Revision History

## 2017-04-22 NOTE — Progress Notes (Signed)
dOccupational Therapy Session Note  Patient Details  Name: [name] Barnett MRN: [id number] Date of Birth: [birth year]  Today's Date: 04/22/2017 OT Individual Time: 0800-0900 OT Individual Time Calculation (min): 60 min    Short Term Goals: Week 1:  OT Short Term Goal 1 (Week 1): Demo consistent use of LUE at diminished level during BADL with min vc OT Short Term Goal 2 (Week 1): Don pants, sitting and standing with Mod assist to pull up pants OT Short Term Goal 3 (Week 1): Pt will complete stand pivot transfer to toilet with min A  Skilled Therapeutic Interventions/Progress Updates:    OT treatments session focused on functional use of L UE, L side attention, and dynamic sitting and standing balance within ADL tasks. Stand step turn transfers with Min A and instructional cues to initiate transfer and locate wc on L side. Mod questioning cues to integrate L UE into shower level bathing. Incorporated L NMR techniques during bathing including weight bearing with washing body parts and drying off the wall and floor. Pt often overshoots/undershoots with L UE use. Pt reports " sometimes I just forget my L side even exists." Incorporated forced use of L UE with dressing tasks, forcing only L UE to work on pulling up pants while R UE stabilized balance on sink. Pt required Mod A for dynamic standing balance overall. L eye patch utilized throughout session except for showering 2/2 diploplia. Pt left seated in wc at end of session with needs met.   Therapy Documentation Precautions:  Precautions Precautions: Fall Precaution Comments: Impulsive Restrictions Weight Bearing Restrictions: No Pain: Pain Assessment Pain Assessment: 0-10 Pain Score: 3  Pain Type: Acute pain Pain Location: Head Pain Orientation: Left Pain Descriptors / Indicators: Headache Pain Frequency: Constant Pain Onset: On-going Pain Intervention(s): Repositioned ADL: ADL ADL Comments: Please see functional navigator  See  Function Navigator for Current Functional Status.   Therapy/Group: Individual Therapy  Valma Cava 04/22/2017, 12:49 PM

## 2017-04-23 ENCOUNTER — Inpatient Hospital Stay (HOSPITAL_COMMUNITY): Payer: BLUE CROSS/BLUE SHIELD | Admitting: Physical Therapy

## 2017-04-23 ENCOUNTER — Inpatient Hospital Stay (HOSPITAL_COMMUNITY): Payer: BLUE CROSS/BLUE SHIELD | Admitting: Speech Pathology

## 2017-04-23 ENCOUNTER — Inpatient Hospital Stay (HOSPITAL_COMMUNITY): Payer: BLUE CROSS/BLUE SHIELD

## 2017-04-23 ENCOUNTER — Inpatient Hospital Stay (HOSPITAL_COMMUNITY): Payer: BLUE CROSS/BLUE SHIELD | Admitting: Occupational Therapy

## 2017-04-23 DIAGNOSIS — I608 Other nontraumatic subarachnoid hemorrhage: Secondary | ICD-10-CM

## 2017-04-23 DIAGNOSIS — I69319 Unspecified symptoms and signs involving cognitive functions following cerebral infarction: Secondary | ICD-10-CM

## 2017-04-23 DIAGNOSIS — I725 Aneurysm of other precerebral arteries: Secondary | ICD-10-CM

## 2017-04-23 DIAGNOSIS — H4313 Vitreous hemorrhage, bilateral: Secondary | ICD-10-CM

## 2017-04-23 LAB — URINE CULTURE

## 2017-04-23 MED ORDER — TOPIRAMATE 25 MG PO TABS
50.0000 mg | ORAL_TABLET | Freq: Two times a day (BID) | ORAL | Status: DC
  Administered 2017-04-23 – 2017-05-07 (×28): 50 mg via ORAL
  Filled 2017-04-23 (×28): qty 2

## 2017-04-23 NOTE — Progress Notes (Signed)
Inpatient Rehabilitation Center Individual Statement of Services  Patient Name:  Clovia Cuffmber F Lundeen  Date:  04/23/2017  Welcome to the Inpatient Rehabilitation Center.  Our goal is to provide you with an individualized program based on your diagnosis and situation, designed to meet your specific needs.  With this comprehensive rehabilitation program, you will be expected to participate in at least 3 hours of rehabilitation therapies Monday-Friday, with modified therapy programming on the weekends.  Your rehabilitation program will include the following services:  Physical Therapy (PT), Occupational Therapy (OT), Speech Therapy (ST), 24 hour per day rehabilitation nursing, Neuropsychology, Case Management (Social Worker), Rehabilitation Medicine, Nutrition Services and Pharmacy Services  Weekly team conferences will be held on Wednesdays to discuss your progress.  Your Social Worker will talk with you frequently to get your input and to update you on team discussions.  Team conferences with you and your family in attendance may also be held.  Expected length of stay: about 2 weeks  Overall anticipated outcome:  Supervision goals with some modified independent goals  Depending on your progress and recovery, your program may change. Your Social Worker will coordinate services and will keep you informed of any changes. Your Social Worker's name and contact numbers are listed  below.  The following services may also be recommended but are not provided by the Inpatient Rehabilitation Center:   Driving Evaluations  Home Health Rehabiltiation Services  Outpatient Rehabilitation Services  Vocational Rehabilitation   Arrangements will be made to provide these services after discharge if needed.  Arrangements include referral to agencies that provide these services.  Your insurance has been verified to be:  [employer] Your primary doctor is:  You will need one and I will work on this with  you.  Pertinent information will be shared with your doctor and your insurance company.  Social Worker:  Staci AcostaJenny Maico [place], LCSW  [phone] or ([phone]) [phone]  Information discussed with and copy given to patient by: Elvera LennoxPrevatt, Rucker Pridgeon Capps, 04/23/2017, 10:47 AM

## 2017-04-23 NOTE — Progress Notes (Signed)
Speech Language Pathology Daily Session Note  Patient Details  Name: [name] Barnett MRN: [id number] Date of Birth: 12/09/1976  Today's Date: 04/23/2017 SLP Individual Time: 1030-1100 SLP Individual Time Calculation (min): 30 min  Short Term Goals: Week 1: SLP Short Term Goal 1 (Week 1): Patient will consume current diet without overt s/s of aspiration and supervision verbal cues for use of cin tuck. SLP Short Term Goal 2 (Week 1): Patient will recall new, daily information with Mod I.  SLP Short Term Goal 3 (Week 1): Patient will demonstrate complex problem solving with Mod I.  SLP Short Term Goal 4 (Week 1): Patient will identify activities that she will need assistance with at discharge with supervision verbal cues.  SLP Short Term Goal 5 (Week 1): Patient will attend to LUE during functional tasks with Min A verbal cues.   Skilled Therapeutic Interventions: Skilled treatment session focused on cognitive goals. SLP facilitated session by providing Min A verbal cues for problem solving during a mildly complex, novel task and Max A verbal cues for utilization of LUE during task. Patient left upright in recliner with all needs within reach. Continue with current plan of care.      Function:  Cognition Comprehension Comprehension assist level: Follows complex conversation/direction with extra time/assistive device  Expression   Expression assist level: Expresses complex ideas: With extra time/assistive device  Social Interaction Social Interaction assist level: Interacts appropriately with others with medication or extra time (anti-anxiety, antidepressant).  Problem Solving Problem solving assist level: Solves basic 75 - 89% of the time/requires cueing 10 - 24% of the time  Memory Memory assist level: Recognizes or recalls 75 - 89% of the time/requires cueing 10 - 24% of the time    Pain Pain Assessment Pain Assessment: 0-10 Pain Score: 5  Pain Type: Acute pain Pain Location:  Head Pain Orientation: Left Pain Descriptors / Indicators: Headache Pain Onset: On-going Pain Intervention(s): Rest  Therapy/Group: Individual Therapy  Dnaiel Voller 04/23/2017, 3:24 PM

## 2017-04-23 NOTE — Evaluation (Signed)
Recreational Therapy Assessment and Plan  [name] Barnett Details  Name: [name] Barnett MRN: [id number] Date of Birth: September 10, 1976 Today's Date: 04/23/2017  Rehab Potential: Good ELOS: 2 weeks   Assessment       [name] Barnett Active Problem List   Diagnosis Date Noted  . SAH (subarachnoid hemorrhage) ([place]) 04/19/2017  . 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 Hypoxia   . Subarachnoid hemorrhage due to ruptured aneurysm ([place]) 04/07/2017  . Elevated gastrin level 01/25/2012  . Hypokalemia 01/21/2012  . Nausea & vomiting 01/20/2012  . Epigastric pain 01/20/2012  . Duodenal ulcer, acute with obstruction 10/17/2011  . S/P laparoscopic cholecystectomy 10/16/2011    Past Medical History:      Past Medical History:  Diagnosis Date  . Duodenal obstruction   . GERD (gastroesophageal reflux disease)   . Hypertension    Past Surgical History:       Past Surgical History:  Procedure Laterality Date  . BALLOON DILATION  12/31/2011   Procedure: BALLOON DILATION;  Surgeon: Missy Sabins, MD;  Location: [facility] ENDOSCOPY;  Service: Endoscopy;  Laterality: N/A;  . CHOLECYSTECTOMY  07/28/11  . ESOPHAGOGASTRODUODENOSCOPY  10/17/2011   Procedure: ESOPHAGOGASTRODUODENOSCOPY (EGD);  Surgeon: Missy Sabins, MD;  Location: [facility] ENDOSCOPY;  Service: Endoscopy;  Laterality: N/A;  . RADIOLOGY WITH ANESTHESIA N/A 04/07/2017   Procedure: RADIOLOGY WITH ANESTHESIA;  Surgeon: Consuella Lose, MD;  Location: [place];  Service: Radiology;  Laterality: N/A;  . TEMPOROMANDIBULAR JOINT SURGERY     2 surgeries  . VAGOTOMY  01/23/2012   Procedure: VAGOTOMY, antrectomy and BII;  Surgeon: Haywood Lasso, MD;  Location: [place];  Service: General;  Laterality: N/A;  Laparotomy with vagotomy.    Assessment & Plan Clinical Impression: [name] Barnett is a 41 y.o. year old female with recent admission to the hospital on 04/07/17 with occipital headache, nausea/vomitting, and LOC leading to fall striking her head. 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 S/p coil embolization 4/29. 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 Pertinent PMH includes HTN, GERD. [name] Barnett transferred to CIR on 04/19/2017.    Pt presents with decreased activity tolerance, decreased functional mobility, decreased balance, ataxia, decreased vision, & left inattention Limiting pt's independence with leisure/community pursuits.  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 Leisure Participation Style: With Family/Friends Awareness of Community Resources: Good-identify 3 post discharge leisure resources [employer] Appropriate for Education?: Yes Recreational Therapy Orientation Orientation -Reviewed with [name] Barnett: Available activity resources Strengths/Weaknesses [name] Barnett Strengths/Abilities: Willingness to participate;Active premorbidly [name] Barnett weaknesses: Physical limitations TR [name] Barnett demonstrates impairments in the following area(s): Endurance;Motor;Pain;Safety  Plan Rec Therapy Plan Is [name] Barnett appropriate for Therapeutic Recreation?: Yes Rehab Potential: Good Treatment times per week: MIn 1 TR session/group >25 minutes during LOS Estimated Length of Stay: 2 weeks TR Treatment/Interventions: Adaptive equipment instruction;1:1 session;Balance/vestibular training;Functional mobility training;Community reintegration;Cognitive remediation/compensation;Group participation (Comment);[name] Barnett/family education;Therapeutic activities;Recreation/leisure participation;Leisure education;Therapeutic exercise;UE/LE Coordination activities;Visual/perceptual remediation/compensation Recommendations for other services: Neuropsych  Recommendations for other services:  Neuropsych  Discharge Criteria: [name] Barnett will be discharged from TR if [name] Barnett refuses treatment 3 consecutive times without medical reason.  If treatment goals not met, if there is a change in medical status, if [name] Barnett makes no progress towards goals or if [name] Barnett is discharged from hospital.  The above assessment, treatment plan, treatment alternatives and goals were discussed and  mutually agreed upon: by [name] Barnett  [place] 04/23/2017, 2:21 PM

## 2017-04-23 NOTE — Progress Notes (Signed)
Physical Therapy Session Note  Patient Details  Name: [name] Barnett MRN: [id number] Date of Birth: 17-Sep-1976  Today's Date: 04/23/2017 PT Individual Time: 1300-1415 PT Individual Time Calculation (min): 75 min   Short Term Goals: Week 1:  PT Short Term Goal 1 (Week 1): Pt will increase bed mobility to S.  PT Short Term Goal 2 (Week 1): Pt will increase transfers to min A.  PT Short Term Goal 3 (Week 1): Pt will increase ambulation with LRAD to min A about 150 feet.  PT Short Term Goal 4 (Week 1): Pt will ascend/descend 4 stairs with B rails and min A PT Short Term Goal 5 (Week 1): Pt will propel w/c about 150 feet with S.   Skilled Therapeutic Interventions/Progress Updates:    reports pain is "better than yesterday" but does not rate.  Session focus on gradation of movement, balance, gait, NMR, and safety awareness.    Pt transfers sit<>stand and stand/pivot throughout session with steady assist and min verbal cues for sequencing and positioning.    Pt engaged in static>dynamic standing task focus on gradation of movement and LUE coordination while reaching for clothespins with min/mod assist for balance and verbal cues for positioning of LEs.  Pt able to tolerate standing ~15 minutes before needing consistent increase in assist and therapist suggested a therapeutic rest break.    Gait training x20' with RW and overall min assist, up to mod with turns, and mod multimodal cues.  Pt c/o increased pain in L lower back along SI joint.  Therapist assessed for pelvic rotation and noted L ilium to be rotated anteriorly.  Pt transitioned to hook lying on mat and PT instructed pt in 2x5 reps of SI muscle energy technique with 5 second hold using treking pole for feedback on first set, and providing hands on education to pt's husband to provide manual resistance for second set.  Pt reports immediate improvement in pain and able to ambulate x80' with RW and min assist following intervention.  Pt  returned to room at end of session and positioned with call bell in reach and needs met.   Therapy Documentation Precautions:  Precautions Precautions: Fall Precaution Comments: Impulsive Restrictions Weight Bearing Restrictions: No   See Function Navigator for Current Functional Status.   Therapy/Group: Individual Therapy  [name] 04/23/2017, 2:22 PM

## 2017-04-23 NOTE — Progress Notes (Signed)
Occupational Therapy Session Note  Patient Details  Name: [name] Barnett MRN: [id number] Date of Birth: Dec 16, 1975  Today's Date: 04/23/2017 OT Individual Time: 0802-0900 OT Individual Time Calculation (min): 58 min    Short Term Goals: Week 1:  OT Short Term Goal 1 (Week 1): Demo consistent use of LUE at diminished level during BADL with min vc OT Short Term Goal 2 (Week 1): Don pants, sitting and standing with Mod assist to pull up pants OT Short Term Goal 3 (Week 1): Pt will complete stand pivot transfer to toilet with min A  Skilled Therapeutic Interventions/Progress Updates:    OT treatment session focused on functional use of L UE, standing balance, sitting balance within ADL tasks. Pt ambulated to bathroom with Mod HHA, and verbal cues for spatial/body awareness. Incorporated L NMR techniques and forced use of L UE within bathing/dressing tasks. Min A overall for sitting balance 2/2 lateral LOB to L with increased task demand. L fine motor coordination with small foam pick up task. Pt returned to room and left with needs met.   Therapy Documentation Precautions:  Precautions Precautions: Fall Precaution Comments: Impulsive Restrictions Weight Bearing Restrictions: No \Pain: Pain Assessment Pain Assessment: 0-10 Pain Score: 5  Pain Type: Acute pain Pain Location: Head Pain Orientation: Left Pain Descriptors / Indicators: Headache Pain Frequency: Intermittent Pain Onset: On-going Patients Stated Pain Goal: 2 Pain Intervention(s): Rest Multiple Pain Sites: No    See Function Navigator for Current Functional Status.   Therapy/Group: Individual Therapy  Valma Cava 04/23/2017, 12:49 PM

## 2017-04-23 NOTE — Progress Notes (Signed)
Social Work Assessment and Plan  Patient Details  Name: [name] Barnett MRN: [id number] Date of Birth: 11-17-1976  Today's Date: 04/22/2017  Problem List:  Patient Active Problem List   Diagnosis Date Noted  . 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 SAH (subarachnoid hemorrhage) ([place]) 04/19/2017  . 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 Hypoxia   . Subarachnoid hemorrhage due to ruptured aneurysm ([place]) 04/07/2017  . Elevated gastrin level 01/25/2012  . Hypokalemia 01/21/2012  . Nausea & vomiting 01/20/2012  . Epigastric pain 01/20/2012  . Duodenal ulcer, acute with obstruction 10/17/2011  . S/P laparoscopic cholecystectomy 10/16/2011   Past Medical History:  Past Medical History:  Diagnosis Date  . Duodenal obstruction   . GERD (gastroesophageal reflux disease)   . Hypertension    Past Surgical History:  Past Surgical History:  Procedure Laterality Date  . BALLOON DILATION  12/31/2011   Procedure: BALLOON DILATION;  Surgeon: Missy Sabins, MD;  Location: [facility] ENDOSCOPY;  Service: Endoscopy;  Laterality: N/A;  . CHOLECYSTECTOMY  07/28/11  . ESOPHAGOGASTRODUODENOSCOPY  10/17/2011   Procedure: ESOPHAGOGASTRODUODENOSCOPY (EGD);  Surgeon: Missy Sabins, MD;  Location: [facility] ENDOSCOPY;  Service: Endoscopy;  Laterality: N/A;  . IR ANGIO INTRA EXTRACRAN SEL INTERNAL CAROTID BILAT MOD SED  04/07/2017  . IR ANGIO VERTEBRAL SEL VERTEBRAL UNI R MOD SED  04/07/2017  . IR ANGIOGRAM FOLLOW UP STUDY  04/07/2017  . IR ANGIOGRAM FOLLOW UP STUDY  04/07/2017  . IR ANGIOGRAM FOLLOW UP STUDY  04/07/2017  . IR ANGIOGRAM FOLLOW UP STUDY  04/07/2017  . IR ANGIOGRAM FOLLOW UP STUDY  04/07/2017  . IR ANGIOGRAM SELECTIVE EACH ADDITIONAL VESSEL  04/07/2017  . IR TRANSCATH/EMBOLIZ  04/07/2017  . RADIOLOGY WITH ANESTHESIA N/A 04/07/2017   Procedure: RADIOLOGY WITH ANESTHESIA;  Surgeon: Consuella Lose, MD;  Location: [place];  Service: Radiology;  Laterality:  N/A;  . TEMPOROMANDIBULAR JOINT SURGERY     2 surgeries  . VAGOTOMY  01/23/2012   Procedure: VAGOTOMY, antrectomy and BII;  Surgeon: Haywood Lasso, MD;  Location: [place];  Service: General;  Laterality: N/A;  Laparotomy with vagotomy.   Social History:  reports that she has been smoking Cigarettes.  She has a 20.00 pack-year smoking history. She has never used smokeless tobacco. She reports that she does not drink alcohol or use drugs.  Family / Support Systems Marital Status: Married How Long?: 22 years (26 years together) Patient Roles: Spouse, Parent, Other (Comment) (friend; employee) Spouse/Significant Other: Virginia Curl - husband - [phone] Children: 39 y/o son (married and lives out of the home) and 42 y/o dtr (lives at home, but currently staying with her brother) Other Supports: co-workers and Fish farm manager; friends Anticipated Caregiver: spouse, Mom and mother in Sports coach Ability/Limitations of Caregiver: spouse works part time nights for [employer] Caregiver Availability: 24/7 Family Dynamics: supportive husband and family  Social History Preferred language: English Religion: Methodist Read: Yes Write: Yes Employment Status: Employed Name of Employer: [employer] - pt is an account collection agent Length of Employment: 20 Return to Work Plans: Pt would like to return there at some point, but feels she will need to go to another position, as her current position is too stressful. Legal History/Current Legal Issues: none reported Guardian/Conservator: MD has determined that pt is capable of making her own decisions.   Abuse/Neglect Physical Abuse: Denies Verbal Abuse: Denies Sexual Abuse: Denies Exploitation of patient/patient's resources: Denies Self-Neglect:  Denies  Emotional Status Pt's affect, behavior and adjustment status: Pt is motivated to get better and wants to return home to her dtr.  She is somewhat frustrated with some staff, but loves other staff. Recent Psychosocial  Issues: Pt has a 42 y/o at home and wants to get back to her.  She is used to being very busy and it is hard for her to just be lying in bed. Psychiatric History: Pt self reports anxiety and a need to control most everything.  She has never been diagnosed with anxiety nor sought treatment, but recognizes stress and anxiety impacting her life. Substance Abuse History: none reported  Patient / Family Perceptions, Expectations & Goals Pt/Family understanding of illness & functional limitations: Pt/husband seem to have understanding of pt's condition and limitations. Premorbid pt/family roles/activities: Pt enjoys cooking for her family and keeping her home clean. Anticipated changes in roles/activities/participation: Pt knows she will have to slow down a lot and let others help her. Pt/family expectations/goals: Pt wants to regain her independence and be back with her family.  She wants to be able to walk and use her left arm/hand more.  She also plans to "be nicer" to her family and not expect them to do everything perfectly or the way she does it.  She also hopes to let go of some of the control she's been holding on to for so long.  Community Resources [employer]: None Premorbid Home Care/DME Agencies: None Transportation available at discharge: husband Resource referrals recommended: Neuropsychology, Support group (specify) (Brain Injury Support Group)  Discharge Planning Living Arrangements: Spouse/significant other, Children Support Systems: Spouse/significant other, Children, Other relatives, Friends/neighbors, Other (Comment) (co-workers/employer) Type of Residence: Private residence Insurance Resources: Multimedia programmer (specify) (Blue Cross Blue Shield) Pensions consultant: Employment, Secondary school teacher Screen Referred: No Money Management: Patient (but she's teaching husband how to do this) Does the patient have any problems obtaining your medications?: No Home  Management: Pt was doing all of the inside chores for the home.  Husband will do these when pt goes home. Patient/Family Preliminary Plans: Pt to return home with her husband and dtr and will also have mother, mother-in-law, son, and dtr-in-law, and other family members to be with her at times when she needs supervision and husband is not available. Barriers to Discharge: Steps Social Work Anticipated Follow Up Needs: HH/OP, Support Group Expected length of stay: 2 weeks  Clinical Impression CSW met with pt and her husband to introduce self and role of CSW, as well as to complete assessment.  Pt was talkative and open with CSW and husband shared some of his concerns, which CSW took to Nursing Director, Verdis Frederickson, and she was to address this pt/husband.  They are both glad pt is receiving therapy on CIR, but seems the transition to the unit was just a little bumpy to start.  Pt is trying hard to let go of control of some things and letting her husband and others help her.  This is very hard for her as the caregiver of the family and a self proclaimed "control freak."  Pt acknowledges that her friends have noticed this too, so she is glad to have this second chance at life to make some changes and treat people better and let go of some things.  She admitted that she may have done better if she'd been on some anxiety medicine and used better ways of coping with stress/anxiety.  She also talked about the importance of having a  PCP so that she can be monitored more closely so that something like this doesn't happen again.  Pt is willing for CSW to help her find a PCP.  Pt will have supervision, as needed.  Her son and his wife will stay at her home the nights that husband is working.  CSW will continue to follow and assist as needed.  [place]arm Jou, Silvestre Mesi 04/22/2017, 2:45 PM

## 2017-04-23 NOTE — Progress Notes (Signed)
Physical Therapy Session Note  Patient Details  Name: [name] Barnett MRN: [id number] Date of Birth: 02/26/1976  Today's Date: 04/23/2017 PT Individual Time: 0925-1010 PT Individual Time Calculation (min): 45 min   Short Term Goals: Week 1:  PT Short Term Goal 1 (Week 1): Pt will increase bed mobility to S.  PT Short Term Goal 2 (Week 1): Pt will increase transfers to min A.  PT Short Term Goal 3 (Week 1): Pt will increase ambulation with LRAD to min A about 150 feet.  PT Short Term Goal 4 (Week 1): Pt will ascend/descend 4 stairs with B rails and min A PT Short Term Goal 5 (Week 1): Pt will propel w/c about 150 feet with S.   Skilled Therapeutic Interventions/Progress Updates:   Focused on neuro re-ed to address postural control, balance, ataxia (LUE and LLE), and graded movement during transfers, sit <> stands, gait with RW, and on Nustep (x 5 min on level 4 with target steps per min ~ 40 for controlled pace and closed chain activity for reciprocal movement pattern retraining - cues for maintaining L hand placement). Sit <> stands from mat with and without UE support with cues for focus on maintaining focus on identified target to decrease vestibular symptoms with transitional movements. Short distance gait with RW with min to mod assist with verbal cues for attention to positioning of RW especially during turns and focus on increasing step length for distances 15-30' x 4 during session. Pt easily distracted during session. Emotional support and encouragement provided throughout session as well as education on recovery.   Therapy Documentation Precautions:  Precautions Precautions: Fall Precaution Comments: Impulsive Restrictions Weight Bearing Restrictions: No  Pain:  c/o headache 4/10 that pt reports stays pretty constant- had already requested Tylenol   See Function Navigator for Current Functional Status.   Therapy/Group: Individual Therapy  [name] Barnett, [name] Barnett  [name] Jenifer B.  [name] Barnett, PT, DPT  04/23/2017, 10:15 AM

## 2017-04-23 NOTE — Progress Notes (Signed)
[facility] PHYSICAL MEDICINE & REHABILITATION     PROGRESS NOTE    Subjective/Complaints: Patient complains of headaches. They were worse a couple days ago and bother her at nighttime greater than daytime. She is afraid to take something that may limit her level of alertness and reduced her ability to participate in the rehabilitation program.  ROS: pt denies nausea, vomiting, diarrhea, cough, shortness of breath or chest pain   Objective: Vital Signs: Blood pressure (!) 152/72, pulse 65, temperature 97.9 F (36.6 C), temperature source Oral, resp. rate 18, height 5\' 4"  (1.626 m), SpO2 99 %. No results found.  Recent Labs  04/21/17 0500 04/22/17 1159  WBC 25.1* 20.9*  HGB 10.9* 11.0*  HCT 35.7* 35.8*  PLT 317 339   No results for input(s): NA, K, CL, GLUCOSE, BUN, CREATININE, CALCIUM in the last 72 hours.  Invalid input(s): CO CBG (last 3)  No results for input(s): GLUCAP in the last 72 hours.  Wt Readings from Last 3 Encounters:  04/07/17 65.8 kg (145 lb 1 oz)  02/08/12 51.7 kg (114 lb)  01/21/12 52.4 kg (115 lb 8.3 oz)    Physical Exam:  Constitutional: She appears well-developed. No distress.  HENT:  Mouth/Throat: Oropharynx is clear and moist.  Eyes:  Left peri-orbital ecchymosis, lid swelling  Neck: No tracheal deviationpresent. No thyromegalypresent.  Cardiovascular: Normal rate. Exam reveals no gallopand no friction rub.  No murmurheard. Respiratory: No respiratory distress. She has no wheezes. She has no rales.  GI: Soft. She exhibits no distension. There is no tenderness.  Neurological:  Right facial weakness is persistent. Speech clear. Horizontal nystagmus with visual deficits noted in superior and inferior visual field. Left lid ptosis. struggles with horizontal tracking as well as superiorly. Ataxia right upper and lower extremity. Right pronator drift. Strength 4/5 RUE prox to distal and RLE prox to distal. 4+ LUE and LLE. Fair insight and  awareness. Follows commands.  Psychiatric: She has a normal mood and affect. Her behavior is normal.   Skin: sutures/wounds clean on scalp  Assessment/Plan: 1. Functional, mobility deficits  secondary to [facility] which require 3+ hours per day of interdisciplinary therapy in a comprehensive inpatient rehab setting. Physiatrist is providing close team supervision and 24 hour management of active medical problems listed below. Physiatrist and rehab team continue to assess barriers to discharge/monitor patient progress toward functional and medical goals.  Function:  Bathing Bathing position   Position: Shower  Bathing parts Body parts bathed by patient: Right arm, Left arm, Chest, Abdomen, Front perineal area, Right upper leg, Left upper leg Body parts bathed by helper: Left lower leg, Right lower leg, Buttocks  Bathing assist Assist Level:  (70%, moderate assistance )      Upper Body Dressing/Undressing Upper body dressing   What is the patient wearing?: Pull over shirt/dress     Pull over shirt/dress - Perfomed by patient: Thread/unthread right sleeve, Thread/unthread left sleeve Pull over shirt/dress - Perfomed by helper: Put head through opening, Pull shirt over trunk        Upper body assist Assist Level:  (50%, moderate assistance)      Lower Body Dressing/Undressing Lower body dressing   What is the patient wearing?: Non-skid slipper socks, Pants, Underwear Underwear - Performed by patient: Thread/unthread right underwear leg, Thread/unthread left underwear leg Underwear - Performed by helper: Pull underwear up/down Pants- Performed by patient: Thread/unthread right pants leg, Thread/unthread left pants leg Pants- Performed by helper: Pull pants up/down   Non-skid slipper  socks- Performed by helper: Don/doff left sock, Don/doff right sock                  Lower body assist Assist for lower body dressing:  (50%, moderate assistance)      Toileting Toileting  Toileting activity did not occur: No continent bowel/bladder event Toileting steps completed by patient: Adjust clothing prior to toileting, Performs perineal hygiene, Adjust clothing after toileting Toileting steps completed by helper: Adjust clothing prior to toileting, Adjust clothing after toileting Toileting Assistive Devices: Grab bar or rail  Toileting assist Assist level: Touching or steadying assistance (Pt.75%)   Transfers Chair/bed transfer   Chair/bed transfer method: Squat pivot Chair/bed transfer assist level: Touching or steadying assistance (Pt > 75%) Chair/bed transfer assistive device: Armrests     Locomotion Ambulation     Max distance: 25 Assist level: Maximal assist (Pt 25 - 49%)   Wheelchair   Type: Manual Max wheelchair distance: 25 Assist Level: Supervision or verbal cues  Cognition Comprehension Comprehension assist level: Follows complex conversation/direction with extra time/assistive device  Expression Expression assist level: Expresses complex ideas: With extra time/assistive device  Social Interaction Social Interaction assist level: Interacts appropriately with others with medication or extra time (anti-anxiety, antidepressant).  Problem Solving Problem solving assist level: Solves basic 75 - 89% of the time/requires cueing 10 - 24% of the time  Memory Memory assist level: Recognizes or recalls 75 - 89% of the time/requires cueing 10 - 24% of the time   Medical Problem List and Plan: 1. 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 Team conf in am -continue CIR therapies. Patient participating despite pain symptoms 2. DVT Prophylaxis/Anticoagulation: Mechanical: Sequential compression devices, below knee Bilateral lower extremities 3. Headaches/Pain Management: having constant, severe headache  -add prn tramadol  -topamax 25mg  bid , Increase to 50 mg  4. Mood: LCSW to follow for evaluation  and support.  5. Neuropsych: This patient iscapable of making decisions on herown behalf. 6. Skin/Wound Care: Routine pressure relief measures.  7. Fluids/Electrolytes/Nutrition: Monitor I/O. Encourage PO  --potassium wnl  8. HTN: nimotop taper for vasospasms  -prn clonidine  -some improvement  9. 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 ABLA: Hgb 11.0---no signs of blood loss  12. Leukocytosis: WBC's down to [facility]20k 5/14  -afebrile, lungs remain clear, wounds clean  -UA +, UCX >100K ecoli --- continue empiric amoxil  -steroid component to elevation as well.   -removed PICC   .  13. Constipation: Augment bowel program.  LOS (Days) 4 A FACE TO FACE EVALUATION WAS PERFORMED  Erick ColaceKIRSTEINS,Asani Mcburney E, MD 04/23/2017 8:36 AM

## 2017-04-24 ENCOUNTER — Inpatient Hospital Stay (HOSPITAL_COMMUNITY): Payer: BLUE CROSS/BLUE SHIELD | Admitting: Occupational Therapy

## 2017-04-24 ENCOUNTER — Inpatient Hospital Stay (HOSPITAL_COMMUNITY): Payer: BLUE CROSS/BLUE SHIELD | Admitting: Speech Pathology

## 2017-04-24 ENCOUNTER — Inpatient Hospital Stay (HOSPITAL_COMMUNITY): Payer: BLUE CROSS/BLUE SHIELD | Admitting: Physical Therapy

## 2017-04-24 ENCOUNTER — Encounter (HOSPITAL_COMMUNITY): Payer: BLUE CROSS/BLUE SHIELD | Admitting: Psychology

## 2017-04-24 NOTE — Progress Notes (Signed)
Occupational Therapy Session Note  Patient Details  Name: [name] Barnett F Beightol MRN: [id number] Date of Birth: 03/12/1976  Today's Date: 04/24/2017 OT Individual Time: 1000-1045 OT Individual Time Calculation (min): 45 min    Skilled Therapeutic Interventions/Progress Updates:    1:1 Therapeutic activity with focus on functional ambulation without RW, postural control, standing balance, visual attention to the left environment, functional use of left hand, etc.  Functional ambulation without AD to focus on postural control and body awareness with mod to max facilitation at hips and trunk to maintain pelvis a neutral and upright trunk posture. When pt did not talk during ambulation able to ambulate with increased pace and safety with decr amt of stops. Pt able to ambulate from gym to her room with mod instructional cues to remain quiet to selectively attention to task.  In gym focus on transitional movement with floor transfers ( from sitting and standing position). In tall kneeling continuing to focus on selective attention to trunk postural and control- statically and dynamically. Focus on visual tracking left hand with functional reaching activity with mod VC for attention to left hand during task. Pt required mod cues and min A to maintain posture in tall kneeling. Left pt resting in bed at end of session.   Therapy Documentation Precautions:  Precautions Precautions: Fall Precaution Comments: Impulsive Restrictions Weight Bearing Restrictions: No Pain:  c/o left hip discomfort- adapted positions, allowed for rest prn and performed stretches ADL: ADL ADL Comments: Please see functional navigator   See Function Navigator for Current Functional Status.   Therapy/Group: Individual Therapy  Roney MansSmith, Jyla Hopf [facility] 04/24/2017, 2:58 PM

## 2017-04-24 NOTE — Progress Notes (Signed)
Speech Language Pathology Daily Session Note  Patient Details  Name: Clovia Cuffmber F Borror MRN: [id number] Date of Birth: 10/20/1976  Today's Date: 04/24/2017 SLP Individual Time: 0725-0820 SLP Individual Time Calculation (min): 55 min  Short Term Goals: Week 1: SLP Short Term Goal 1 (Week 1): Patient will consume current diet without overt s/s of aspiration and supervision verbal cues for use of cin tuck. SLP Short Term Goal 2 (Week 1): Patient will recall new, daily information with Mod I.  SLP Short Term Goal 3 (Week 1): Patient will demonstrate complex problem solving with Mod I.  SLP Short Term Goal 4 (Week 1): Patient will identify activities that she will need assistance with at discharge with supervision verbal cues.  SLP Short Term Goal 5 (Week 1): Patient will attend to LUE during functional tasks with Min A verbal cues.   Skilled Therapeutic Interventions: Skilled treatment session focused on cognitive and dysphagia goals. Upon arrival, patient was awake while supine in bed. Patient transferred to the wheelchair with Min A verbal cues needed for problem solving. Patient consumed breakfast meal of regular textures with thin liquids with cough X 1 with initial sip due to not utilizing chin tuck. However, patient utilized the chin tuck throughout the remainder of the meal without overt s/s of aspiration. Recommend patient continue full supervision with meals. Patient transferred back to bed at end of session and left with all needs within reach. Continue with current plan of care.      Function:  Eating Eating   Modified Consistency Diet: No Eating Assist Level: Supervision or verbal cues           Cognition Comprehension Comprehension assist level: Follows basic conversation/direction with no assist  Expression   Expression assist level: Expresses basic needs/ideas: With no assist  Social Interaction Social Interaction assist level: Interacts appropriately 75 - 89% of the time -  Needs redirection for appropriate language or to initiate interaction.  Problem Solving Problem solving assist level: Solves basic 75 - 89% of the time/requires cueing 10 - 24% of the time  Memory Memory assist level: Recognizes or recalls 75 - 89% of the time/requires cueing 10 - 24% of the time    Pain No/Denies Pain   Therapy/Group: Individual Therapy  Curtiss Mahmood 04/24/2017, 3:04 PM

## 2017-04-24 NOTE — Progress Notes (Signed)
Occupational Therapy Session Note  Patient Details  Name: [name] Barnett MRN: [id number] Date of Birth: [birth year]  Today's Date: 04/24/2017 OT Individual Time: 1448-1530 OT Individual Time Calculation (min): 42 min   Short Term Goals: Week 1:  OT Short Term Goal 1 (Week 1): Demo consistent use of LUE at diminished level during BADL with min vc OT Short Term Goal 2 (Week 1): Don pants, sitting and standing with Mod assist to pull up pants OT Short Term Goal 3 (Week 1): Pt will complete stand pivot transfer to toilet with min A  Skilled Therapeutic Interventions/Progress Updates:    OT treatment session focused on L fine-motor coordination and sitting balance. Pt ambulated 10 feet in room to wc with mod-max facilitation for weight shifting and postural control. Utilized red theraputty for fine motor activity focused on in-hand manipulation, translation, and rotation. Incorporated NMR techniques including weight bearing to flatten putty. With B LE's supported, pt able to maintain static sitting balance with supervision. Pt returned to room at end of session and left seated in wc with family present and needs met.   Therapy Documentation Precautions:  Precautions Precautions: Fall Precaution Comments: Impulsive Restrictions Weight Bearing Restrictions: No Pain: Pain Assessment Pain Assessment: 0-10 Pain Location: Head Pain Orientation: Left Pain Descriptors / Indicators: Headache Pain Onset: On-going Pain Intervention(s): Repositioned ADL: ADL ADL Comments: Please see functional navigator  See Function Navigator for Current Functional Status.   Therapy/Group: Individual Therapy  Valma Cava 04/24/2017, 3:42 PM

## 2017-04-24 NOTE — Progress Notes (Signed)
Physical Therapy Session Note  Patient Details  Name: [name] Barnett MRN: [id number] Date of Birth: May 21, 1976  Today's Date: 04/24/2017 PT Individual Time: 1300-1406 PT Individual Time Calculation (min): 66 min   Short Term Goals: Week 1:  PT Short Term Goal 1 (Week 1): Pt will increase bed mobility to S.  PT Short Term Goal 2 (Week 1): Pt will increase transfers to min A.  PT Short Term Goal 3 (Week 1): Pt will increase ambulation with LRAD to min A about 150 feet.  PT Short Term Goal 4 (Week 1): Pt will ascend/descend 4 stairs with B rails and min A PT Short Term Goal 5 (Week 1): Pt will propel w/c about 150 feet with S.   Skilled Therapeutic Interventions/Progress Updates:    no c/o pain.  Session focus on attention, visual scanning, problem solving, and safety awareness during functional mobility tasks.    Pt requesting to toilet at start and completion of session 2/2 UTI.  Ambulatory transfer into bathroom with RW and min assist with mod multimodal cues for safety and sequencing as pt attempting to steady self on towels hanging from the door when exiting.  Pt completes 3/3 toileting steps with steady assist for standing balance.    Gait to therapy gym with mod HHA with verbal cues for attention and step length as well as tactile cues for posture.  Gait x100' through gym +190' back to room with RW and min assist for walker positioning and verbal cues to limit self distractions.  Utilized music when ambulating back to room with some improvement in gait pattern but no improvement in attention.    Pt engaged in cognitive table top task focus on gradation of movement, visual perceptual remediation, and problem solving during 2 simple pipe tree patterns.  Pt required mod/max multimodal cues for choosing correct piece and organization of pieces to create the design.   Left in recliner in room at end of session with call bell in reach and needs met.    Therapy Documentation Precautions:   Precautions Precautions: Fall Precaution Comments: Impulsive Restrictions Weight Bearing Restrictions: No   See Function Navigator for Current Functional Status.   Therapy/Group: Individual Therapy  [name] 04/24/2017, 3:14 PM

## 2017-04-24 NOTE — Consult Note (Signed)
Neuropsychological Consultation   Patient:   [name] Barnett   DOB:   07/30/1976  MR Number:  [id number]  [facility] [address] [id number] [place] [id number] Dept: [phone] Loc: [phone]           Date of Service:   04/24/2017  Start Time:   9 AM End Time:   10 AM  Provider/Observer:  Arley PhenixJohn Rodenbough, Psy.D.       Clinical Neuropsychologist       Billing Code/Service: [id number] 4 Units  Chief Complaint:    The patient reports that she is working on adjustment to the changes after her CVA/ruptured aneurism.  She has left side weakness and significant changes in vision.  Coordination and motor control issues  Reason for Service:  The patient is a 41 year old [facility].  Below is the HPI from current admission.  Maui F Kallamis a 40 y.o.femalewith history of HTN, GERD who was admitted on 04/07/17 with worsening of HA, episode of emesis followed by LOC with fall and LLE weakness. 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 She was taken to OR by Dr. Conchita ParisNundkumar and underwent coil embolization of basilar artery aneurysm.[name]. 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 EVD placed at bedside with improvement in LOC and IS used to help improve low lung volumes. Patient with resultant balance deficits, left gaze palsy with decreased vision Left >Right as well as persistentheadaches . 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 EVD removed 5/10  MBS done due to reports of coughing with liquids and chin tuck recommended with liquids to prevent aspiration.  She has had reports of changes in vision and opthalmology evaluation showed evidence of bilateral preretinal/vitreous hemorrhage associated with SAH. Dr. Genia DelMincey indicates that vitreous hemorrhage should resolve over next several weeks and no signs of retinal tear or detachment--call for worsening of vision/  Therapy ongoing and patient with ataxic gait with balance deficits, tendency to lean to the left, decrease in visual acuity as well as proprioceptive deficits. CIR recommended due to significant deficits in mobility and ability to perform ADL tasks.    Current Status:  The patient describes adjustment issues with all of the physical changes.  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  Her memory is improving for learning new information.  She rembers some events around the developing bleed on 04/07/17 and there is some traumatic experiences around those memories.  Reliability of Information: Information from review of medical chart, discussions with treatment team and 1 hour face to face interview with patient.    Behavioral Observation: [name] Cuffmber F Nobis  presents as a 41 y.o.-year-old Right Caucasian Female who appeared her stated age. her dress was Appropriate and she was Well Groomed and her manners were Appropriate to the situation.  her participation was indicative of Appropriate and Attentive behaviors.  There were physical disabilities noted with left hand weakness and left leg weakness.  she displayed an appropriate level of cooperation and motivation.     Interactions:    Active Appropriate and Attentive  Attention:  within normal limits and attention span and concentration were age appropriate  Memory:   abnormal; recent memory intact, remote memory impaired  Visuo-spatial:  abnormal  The patient has significant vision changes.  Speech (Volume):  normal  Speech:   normal; fluent  Thought Process:  Coherent  and Relevant  Though Content:  WNL; not suicidal  Orientation:   person, place, time/date and situation  Judgment:   Good  Planning:   Good  Affect:    Anxious and Excited  Mood:    Anxious  Insight:   Good  Intelligence:   normal   Medical History:   Past Medical History:  Diagnosis Date  . Duodenal obstruction   . GERD (gastroesophageal reflux disease)   . Hypertension            Psychiatric History:  The patient denies signficant prior psychiatric history  Family Med/Psych History:  Family History  Problem Relation Age of Onset  . Malignant hyperthermia Neg Hx     Risk of Suicide/Violence: virtually non-existent Patient denies SI or HI  Impression/DX:  The patient is a 41 year old female.  She is very frustrated to loss of function and is trying to come to grips with what has happened and how she can improve and return as much function as she can.  She has been coping with the memory of fear that came over her at time of event. She had fallen and could not see but knew something bad had happened and she thought she had a stroke.  She was afraid her daughter would find her on floor or she could not call out to Husband to let him know she was there.  She is very motivated to work on rehabilitative efforts.  Disposition/Plan:  Will see patient again to continue to work on coping issues.  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

## 2017-04-24 NOTE — Progress Notes (Signed)
[place] PHYSICAL MEDICINE & REHABILITATION     PROGRESS NOTE    Subjective/Complaints: Patient felt very warm last night and feels warm. This morning. No fevers. No respiratory issues.  ROS: pt denies nausea, vomiting, diarrhea, cough, shortness of breath or chest pain   Objective: Vital Signs: Blood pressure (!) 160/72, pulse (!) 58, temperature 97.9 F (36.6 C), temperature source Oral, resp. rate 18, height _0  (1.626 m), SpO2 99 %. No results found.  Recent Labs  04/22/17 1159  WBC 20.9*  HGB 11.0*  HCT 35.8*  PLT 339   No results for input(s): NA, K, CL, GLUCOSE, BUN, CREATININE, CALCIUM in the last 72 hours.  Invalid input(s): CO CBG (last 3)  No results for input(s): GLUCAP in the last 72 hours.  Wt Readings from Last 3 Encounters:  04/07/17 65.8 kg (145 lb 1 oz)  02/08/12 51.7 kg (114 lb)  01/21/12 52.4 kg (115 lb 8.3 oz)    Physical Exam:  Constitutional: She appears well-developed. No distress.  HENT:  Mouth/Throat: Oropharynx is clear and moist.  Eyes:  Left peri-orbital ecchymosis, lid swelling  Neck: No tracheal deviationpresent. No thyromegalypresent.  Cardiovascular: Normal rate. Exam reveals no gallopand no friction rub.  No murmurheard. Respiratory: No respiratory distress. She has no wheezes. She has no rales.  GI: Soft. She exhibits no distension. There is no tenderness.  Neurological:  Right facial weakness is persistent. Speech clear. . Left lid ptosis. struggles with horizontal tracking as well as superiorly. Has Left  Eye patch Ataxia right upper and lower extremity. Right pronator drift. Strength 4/5 RUE prox to distal and RLE prox to distal. 4+ LUE and LLE. Fair insight and awareness. Follows commands.  Psychiatric: She has a normal mood and affect. Her behavior is normal.   Skin: sutures/wounds clean on scalp  Assessment/Plan: 1. Functional, mobility deficits  secondary to [facility] which require 3+ hours per day of  interdisciplinary therapy in a comprehensive inpatient rehab setting. Physiatrist is providing close team supervision and 24 hour management of active medical problems listed below. Physiatrist and rehab team continue to assess barriers to discharge/monitor patient progress toward functional and medical goals.  Function:  Bathing Bathing position   Position: Shower  Bathing parts Body parts bathed by patient: Right arm, Left arm, Chest, Abdomen, Front perineal area, Right upper leg, Left upper leg Body parts bathed by helper: Left lower leg, Right lower leg, Buttocks  Bathing assist Assist Level:  (70%, moderate assistance )      Upper Body Dressing/Undressing Upper body dressing   What is the patient wearing?: Pull over shirt/dress     Pull over shirt/dress - Perfomed by patient: Thread/unthread right sleeve, Thread/unthread left sleeve Pull over shirt/dress - Perfomed by helper: Put head through opening, Pull shirt over trunk        Upper body assist Assist Level:  (50%, moderate assistance)      Lower Body Dressing/Undressing Lower body dressing   What is the patient wearing?: Non-skid slipper socks, Pants, Underwear Underwear - Performed by patient: Thread/unthread right underwear leg, Thread/unthread left underwear leg Underwear - Performed by helper: Pull underwear up/down Pants- Performed by patient: Thread/unthread right pants leg, Thread/unthread left pants leg Pants- Performed by helper: Pull pants up/down   Non-skid slipper socks- Performed by helper: Don/doff left sock, Don/doff right sock                  Lower body assist Assist for lower body dressing:  (50%,  moderate assistance)      Toileting Toileting Toileting activity did not occur: No continent bowel/bladder event Toileting steps completed by patient: Adjust clothing prior to toileting, Performs perineal hygiene, Adjust clothing after toileting Toileting steps completed by helper: Adjust clothing  prior to toileting, Adjust clothing after toileting Toileting Assistive Devices: Grab bar or rail  Toileting assist Assist level: Touching or steadying assistance (Pt.75%)   Transfers Chair/bed transfer   Chair/bed transfer method: Ambulatory, Stand pivot Chair/bed transfer assist level: Touching or steadying assistance (Pt > 75%) Chair/bed transfer assistive device: Walker, Air cabin crew     Max distance: 80 Assist level: Moderate assist (Pt 50 - 74%)   Wheelchair   Type: Manual Max wheelchair distance: 25 Assist Level: Supervision or verbal cues  Cognition Comprehension Comprehension assist level: Follows basic conversation/direction with no assist  Expression Expression assist level: Expresses basic needs/ideas: With no assist  Social Interaction Social Interaction assist level: Interacts appropriately 75 - 89% of the time - Needs redirection for appropriate language or to initiate interaction.  Problem Solving Problem solving assist level: Solves basic 75 - 89% of the time/requires cueing 10 - 24% of the time  Memory Memory assist level: Recognizes or recalls 75 - 89% of the time/requires cueing 10 - 24% of the time   Medical Problem List and Plan: 1. 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 Team conference today please see physician documentation under team conference tab, met with team face-to-face to discuss problems,progress, and goals. Formulized individual treatment plan based on medical history, underlying problem and comorbidities. -continue CIR therapies. Patient participating despite pain symptoms 2. DVT Prophylaxis/Anticoagulation: Mechanical: Sequential compression devices, below knee Bilateral lower extremities 3. Headaches/Pain Management:   -add prn tramadol  -topamax 8m bid , Increased to 50 mg , no headaches reported today 4. Mood: LCSW to follow for evaluation and support.   5. Neuropsych: This patient iscapable of making decisions on herown behalf. 6. Skin/Wound Care: Routine pressure relief measures.  7. Fluids/Electrolytes/Nutrition: Monitor I/O. Encourage PO  --potassium wnl  8. HTN: nimotop taper for vasospasms  -prn clonidine  -some improvement  9. 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 ABLA: Hgb 11.0---no signs of blood loss  12. Leuk[facility]: [facility]  -afebrile, lungs remain clear, wounds clean  -UA +, UCX >100K ecoli S to  amoxil  -steroid component to elevation as well.     .  13. Constipation: Augment bowel program.  LOS (Days) 5 A FACE TO FACE EVALUATION WAS PERFORMED  KCharlett Blake MD 04/24/2017 9:07 AM

## 2017-04-25 ENCOUNTER — Inpatient Hospital Stay (HOSPITAL_COMMUNITY): Payer: BLUE CROSS/BLUE SHIELD | Admitting: Speech Pathology

## 2017-04-25 ENCOUNTER — Inpatient Hospital Stay (HOSPITAL_COMMUNITY): Payer: BLUE CROSS/BLUE SHIELD | Admitting: Occupational Therapy

## 2017-04-25 ENCOUNTER — Inpatient Hospital Stay (HOSPITAL_COMMUNITY): Payer: BLUE CROSS/BLUE SHIELD | Admitting: Physical Therapy

## 2017-04-25 MED ORDER — DOXAZOSIN MESYLATE 1 MG PO TABS
1.0000 mg | ORAL_TABLET | Freq: Every day | ORAL | Status: DC
  Administered 2017-04-25 – 2017-05-06 (×12): 1 mg via ORAL
  Filled 2017-04-25 (×12): qty 1

## 2017-04-25 MED ORDER — NIMODIPINE 30 MG PO CAPS
60.0000 mg | ORAL_CAPSULE | Freq: Four times a day (QID) | ORAL | Status: DC
  Administered 2017-04-26 – 2017-05-05 (×35): 60 mg via ORAL
  Filled 2017-04-25 (×35): qty 2

## 2017-04-25 NOTE — Progress Notes (Signed)
Speech Language Pathology Daily Session Note  Patient Details  Name: [name] Barnett MRN: [id number] Date of Birth: 05/14/1976  Today's Date: 04/25/2017 SLP Individual Time: 1300-1355 SLP Individual Time Calculation (min): 55 min  Short Term Goals: Week 1: SLP Short Term Goal 1 (Week 1): Patient will consume current diet without overt s/s of aspiration and supervision verbal cues for use of cin tuck. SLP Short Term Goal 2 (Week 1): Patient will recall new, daily information with Mod I.  SLP Short Term Goal 3 (Week 1): Patient will demonstrate complex problem solving with Mod I.  SLP Short Term Goal 4 (Week 1): Patient will identify activities that she will need assistance with at discharge with supervision verbal cues.  SLP Short Term Goal 5 (Week 1): Patient will attend to LUE during functional tasks with Min A verbal cues.   Skilled Therapeutic Interventions: Skilled treatment session focused on cognitive goals. Patient reported increased difficulty with vision this afternoon, suspect due to fatigue. RN and PA aware. Patient able to sort bright colors from a field of 2 utilizing her LUE with extra time. Patient also independently generated a list of ideas to decrease fatigue throughout today in hopes of improving visual acuity in the afternoon. Patient's husband present and asking question in regards to patient's current cognitive functioning and goals of skilled SLP intervention. All questions were answered at this time. Patient handed off to PT. Continue with current plan of care.      Function:  Cognition Comprehension Comprehension assist level: Follows basic conversation/direction with no assist  Expression   Expression assist level: Expresses basic needs/ideas: With no assist  Social Interaction Social Interaction assist level: Interacts appropriately 75 - 89% of the time - Needs redirection for appropriate language or to initiate interaction.  Problem Solving Problem solving assist  level: Solves basic 75 - 89% of the time/requires cueing 10 - 24% of the time  Memory Memory assist level: Recognizes or recalls 75 - 89% of the time/requires cueing 10 - 24% of the time    Pain No/Denies Pain   Therapy/Group: Individual Therapy  Ignatz Deis 04/25/2017, 4:17 PM

## 2017-04-25 NOTE — Patient Care Conference (Signed)
Inpatient RehabilitationTeam Conference and Plan of Care Update Date: 04/24/2017   Time: 2:54 PM    Patient Name: [name] Barnett      Medical Record Number: [id number]  Date of Birth: 24-Oct-1976 Sex: Female         Room/Bed: 4W06C/4W06C-01 Payor Info: Payor: BLUE CROSS BLUE SHIELD / Plan: BCBS OTHER / Product Type: *No Product type* /    Admitting Diagnosis: SAH  Admit Date/Time:  04/19/2017  6:08 PM Admission Comments: No comment available   Primary Diagnosis:  Subarachnoid hemorrhage due to ruptured aneurysm [facility]) Principal Problem: Subarachnoid hemorrhage due to ruptured aneurysm [facility])  Patient Active Problem List   Diagnosis Date Noted  . 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 SAH (subarachnoid hemorrhage) ([name]) 04/19/2017  . Basilar artery aneurysm ([name])   . Hypoxia   . 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 Elevated gastrin level 01/25/2012  . Hypokalemia 01/21/2012  . Nausea & vomiting 01/20/2012  . Epigastric pain 01/20/2012  . Duodenal ulcer, acute with obstruction 10/17/2011  . S/P laparoscopic cholecystectomy 10/16/2011    Expected Discharge Date: Expected Discharge Date: 05/04/17  Team Members Present: Physician leading conference: Dr. Claudette Laws Social Worker Present: Staci Acosta, LCSW Nurse Present: Ronny Bacon, RN PT Present: Teodoro Kil, PT OT Present: Kearney Hard, OT SLP Present: Feliberto Gottron, SLP PPS Coordinator present : Tora Duck, RN, CRRN     Current Status/Progress Goal Weekly Team Focus  Medical   LUE ataxia, Left eye retinal bleed Terson's Syndrome  reduce fall risk  improve static and dynamic    Bowel/Bladder   continent of bowel and bladder         Swallow/Nutrition/ Hydration   reular thin ; takes meds whole with water; full supervision  Mod I  Use of swallowing strategies    ADL's   Min A overall - up to mod A for  standing balance  Mod I/supervision  Standing balance, sitting balance, modified bathing/dressing, L NMR, L ataxia   Mobility   stand and pivot to wheelchair  supervision/mod I ambulatory with LRAD  balance, coordination, gait with LRAD, gradation of movement   Communication             Safety/Cognition/ Behavioral Observations  Min A  Mod I  complex problem solving, awareness, recall    Pain   no complaints of pain         Skin   sutures/ staples noted; s/p rt crani          Rehab Goals Patient on target to meet rehab goals: Yes Rehab Goals Revised: none - pt's first conference *See Care Plan and progress notes for long and short-term goals.  Barriers to Discharge: severe balance issues, LUE coordination, visual deficits    Possible Resolutions to Barriers:  Cont rehab    Discharge Planning/Teaching Needs:  Pt to return to her home where her husband, son, or dtr-in-law will provide supervision and assistance, as needed.  Pt's husband is here regularly, but he will be offered family education closer to d/c.   Team Discussion:  Pt is doing well medically post SAH with L retinal hemorrhage which will just need time to resolve.  Dr. Wynn Banker and PT were questioning if pt has a L field cut, too, and team will assess more closely for this, but pt is able to find items on food tray and when completing ADLs.  Pt on thin liquids and doing chin tuck.  Her problem solving and awareness are improving.  Increased topamax is helping with headaches.  Pt is min assist for transfers and gait depends on fatigue level.  Pt has difficulty with standing and sitting balance due to ataxia, but is motivated and wants to do well in therapy.  Revisions to Treatment Plan:  none   Continued Need for Acute Rehabilitation Level of Care: The patient requires daily medical management by a physician with specialized training in physical medicine and rehabilitation for the following conditions: Daily direction  of a multidisciplinary physical rehabilitation program to ensure safe treatment while eliciting the highest outcome that is of practical value to the patient.: Yes Daily medical management of patient stability for increased activity during participation in an intensive rehabilitation regime.: Yes Daily analysis of laboratory values and/or radiology reports with any subsequent need for medication adjustment of medical intervention for : Post surgical problems;Neurological problems  Pelagia Iacobucci, Vista DeckJennifer Capps 04/25/2017, 2:54 PM

## 2017-04-25 NOTE — Progress Notes (Signed)
Physical Therapy Session Note  Patient Details  Name: [name] Barnett MRN: [id number] Date of Birth: 1976-12-05  Today's Date: 04/25/2017 PT Concurrent Time: 1356-1505 PT Concurrent Time Calculation (min): 69 min  Short Term Goals: Week 1:  PT Short Term Goal 1 (Week 1): Pt will increase bed mobility to S.  PT Short Term Goal 2 (Week 1): Pt will increase transfers to min A.  PT Short Term Goal 3 (Week 1): Pt will increase ambulation with LRAD to min A about 150 feet.  PT Short Term Goal 4 (Week 1): Pt will ascend/descend 4 stairs with B rails and min A PT Short Term Goal 5 (Week 1): Pt will propel w/c about 150 feet with S.   Skilled Therapeutic Interventions/Progress Updates:    no c/o pain.  Session focus on coordination and gradation of movement with LUE/LLE.    Nustep x12 minutes with timer set for auditory feedback (pt reporting decreased vision in PM), focus on LUE/LLE positioning and maintaining LUE grasp on handle.  NMR for gradation of movement, strengthening, and coordination with BUE shoulder flexion holding ball in supine, 2x10 reps with mod verbal cues for correct form.  Gait training x200' with RW and min>mod assist for balance and steering RW due to low vision.  Pt returned to room at end of session and positioned in w/c with call bell in reach and needs met.   Therapy Documentation Precautions:  Precautions Precautions: Fall Precaution Comments: Impulsive Restrictions Weight Bearing Restrictions: No   See Function Navigator for Current Functional Status.   Therapy/Group: Individual Therapy  [name] 04/25/2017, 5:04 PM

## 2017-04-25 NOTE — Plan of Care (Signed)
Problem: RH BOWEL ELIMINATION Goal: RH STG MANAGE BOWEL WITH ASSISTANCE STG Manage Bowel with min Assistance.   Outcome: Not Progressing LBM 04-21-17 patient refuses intervention

## 2017-04-25 NOTE — Progress Notes (Signed)
Social Work Patient ID: [name] CuffAmber F Barnett, female   DOB: 06/24/1976, 41 y.o.   MRN: [id number]   CSW was able to speak with Jorja Loaim, pt's husband today, who is pleased with pt's progress and agrees with d/c date of 05-04-17.  CSW remains available as needed.

## 2017-04-25 NOTE — Plan of Care (Signed)
Problem: RH BOWEL ELIMINATION Goal: RH STG MANAGE BOWEL WITH ASSISTANCE STG Manage Bowel with min Assistance.   Outcome: Not Progressing LBM 5/12; per pt she will take miralax after therapy

## 2017-04-25 NOTE — Progress Notes (Signed)
Social Work Patient ID: [name] Barnett, female   DOB: [birth year], 41 y.o.   MRN: [id number]   CSW met with pt to update her on team conference discussion and targeted d/c date of 05-04-17.  Pt was pleased to have another week of therapy.  Pt's husband was not present at the time of visit, but CSW will update him.  Pt feels care has been very good on CIR.  CSW will continue to follow and assist as needed.

## 2017-04-25 NOTE — Progress Notes (Signed)
[place] PHYSICAL MEDICINE & REHABILITATION     PROGRESS NOTE    Subjective/Complaints: No headache complaints today, still feels shaky in the left arm.  ROS: pt denies nausea, vomiting, diarrhea, cough, shortness of breath or chest pain   Objective: Vital Signs: Blood pressure (!) 161/58, pulse 91, temperature 97.4 F (36.3 C), temperature source Oral, resp. rate 18, height 5\' 4"  (1.626 m), SpO2 100 %. No results found.  Recent Labs  04/22/17 1159  WBC 20.9*  HGB 11.0*  HCT 35.8*  PLT 339   No results for input(s): NA, K, CL, GLUCOSE, BUN, CREATININE, CALCIUM in the last 72 hours.  Invalid input(s): CO CBG (last 3)  No results for input(s): GLUCAP in the last 72 hours.  Wt Readings from Last 3 Encounters:  04/07/17 65.8 kg (145 lb 1 oz)  02/08/12 51.7 kg (114 lb)  01/21/12 52.4 kg (115 lb 8.3 oz)    Physical Exam:  Constitutional: She appears well-developed. No distress.  HENT:  Mouth/Throat: Oropharynx is clear and moist.  Eyes:  Left peri-orbital ecchymosis, lid swelling  Neck: No tracheal deviationpresent. No thyromegalypresent.  Cardiovascular: Normal rate. Exam reveals no gallopand no friction rub.  No murmurheard. Respiratory: No respiratory distress. She has no wheezes. She has no rales.  GI: Soft. She exhibits no distension. There is no tenderness.  Neurological:  Right facial weakness is persistent. Speech clear. . Left lid ptosis. struggles with horizontal tracking as well as superiorly. Has Left  Eye patch Ataxia right upper and lower extremity. Right pronator drift. Strength 4/5 RUE prox to distal and RLE prox to distal. 4+ LUE and LLE. Fair insight and awareness. Follows commands.  Psychiatric: She has a normal mood and affect. Her behavior is normal.   Skin: sutures/wounds clean on scalp  Assessment/Plan: 1. Functional, mobility deficits  [facility] which require 3+ hours per day of interdisciplinary therapy in a comprehensive  inpatient rehab setting. Physiatrist is providing close team supervision and 24 hour management of active medical problems listed below. Physiatrist and rehab team continue to assess barriers to discharge/monitor patient progress toward functional and medical goals.  Function:  Bathing Bathing position   Position: Shower  Bathing parts Body parts bathed by patient: Right arm, Left arm, Chest, Abdomen, Front perineal area, Right upper leg, Left upper leg Body parts bathed by helper: Left lower leg, Right lower leg, Buttocks  Bathing assist Assist Level:  (70%, moderate assistance )      Upper Body Dressing/Undressing Upper body dressing   What is the patient wearing?: Pull over shirt/dress     Pull over shirt/dress - Perfomed by patient: Thread/unthread right sleeve, Thread/unthread left sleeve Pull over shirt/dress - Perfomed by helper: Put head through opening, Pull shirt over trunk        Upper body assist Assist Level:  (50%, moderate assistance)      Lower Body Dressing/Undressing Lower body dressing   What is the patient wearing?: Non-skid slipper socks, Pants, Underwear Underwear - Performed by patient: Thread/unthread right underwear leg, Thread/unthread left underwear leg Underwear - Performed by helper: Pull underwear up/down Pants- Performed by patient: Thread/unthread right pants leg, Thread/unthread left pants leg Pants- Performed by helper: Pull pants up/down   Non-skid slipper socks- Performed by helper: Don/doff left sock, Don/doff right sock                  Lower body assist Assist for lower body dressing:  (50%, moderate assistance)  Toileting Toileting Toileting activity did not occur: No continent bowel/bladder event Toileting steps completed by patient: Adjust clothing prior to toileting, Performs perineal hygiene Toileting steps completed by helper: Adjust clothing after toileting Toileting Assistive Devices: Grab bar or rail  Toileting  assist Assist level: Touching or steadying assistance (Pt.75%)   Transfers Chair/bed transfer   Chair/bed transfer method: Ambulatory Chair/bed transfer assist level: Touching or steadying assistance (Pt > 75%) Chair/bed transfer assistive device: Armrests, Patent attorney     Max distance: 190 Assist level: Moderate assist (Pt 50 - 74%) (mod with HHA, min with RW)   Wheelchair   Type: Manual Max wheelchair distance: 25 Assist Level: Supervision or verbal cues  Cognition Comprehension Comprehension assist level: Follows basic conversation/direction with no assist  Expression Expression assist level: Expresses basic needs/ideas: With no assist  Social Interaction Social Interaction assist level: Interacts appropriately 75 - 89% of the time - Needs redirection for appropriate language or to initiate interaction.  Problem Solving Problem solving assist level: Solves basic 75 - 89% of the time/requires cueing 10 - 24% of the time  Memory Memory assist level: Recognizes or recalls 75 - 89% of the time/requires cueing 10 - 24% of the time   Medical Problem List and Plan: 1. 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 -continue CIR therapies. Patient participating despite pain symptoms 2. DVT Prophylaxis/Anticoagulation: Mechanical: Sequential compression devices, below knee Bilateral lower extremities 3. Headaches/Pain Management:   -add prn tramadol  -topamax 25mg  bid , Increased to 50 mg , no headaches reported today 4. Mood: LCSW to follow for evaluation and support.  5. Neuropsych: This patient iscapable of making decisions on herown behalf. 6. Skin/Wound Care: Routine pressure relief measures.  7. Fluids/Electrolytes/Nutrition: Monitor I/O. Encourage PO  --potassium wnl  8. HTN: nimotop taper for vasospasms  -prn clonidine  -Mainly elevated in a.m., trial doxazosin daily at bedtime 9. Bilateral  retinal hemorrhages (Terson's syndrome) with left CN III palsy: continue conservative care/observation -expected resolution of hemorrhages over next several weeks -outpt optho recommended in 2 weeks (vs after discharge) -eye patching OS  11. ABLA: Hgb 11.0---no signs of blood loss  12. Leukocytosis: WBC's down to [facility] 5/14  -afebrile, lungs remain clear, wounds clean  -UA +, UCX >100K ecoli S to  amoxil  -steroid component to elevation as well.     .  13. Constipation: Augment bowel program.  LOS (Days) 6 A FACE TO FACE EVALUATION WAS PERFORMED  Erick Colace, MD 04/25/2017 8:52 AM

## 2017-04-25 NOTE — Plan of Care (Signed)
Problem: RH BOWEL ELIMINATION Goal: RH STG MANAGE BOWEL W/MEDICATION W/ASSISTANCE STG Manage Bowel with Medication with min Assistance.   Outcome: Not Progressing LBM 04-21-17 patient refuses intervention

## 2017-04-25 NOTE — Progress Notes (Signed)
Occupational Therapy Session Note  Patient Details  Name: [name] Barnett MRN: [id number] Date of Birth: 10/21/1976  Today's Date: 04/25/2017 OT Concurrent Time: 1000-1100 OT Concurrent Time Calculation (min): 60 min   Short Term Goals: Week 1:  OT Short Term Goal 1 (Week 1): Demo consistent use of LUE at diminished level during BADL with min vc OT Short Term Goal 2 (Week 1): Don pants, sitting and standing with Mod assist to pull up pants OT Short Term Goal 3 (Week 1): Pt will complete stand pivot transfer to toilet with min A  Skilled Therapeutic Interventions/Progress Updates:    Pt seen for concurrent session with focus on functional use of LUE to obtaining familiar kitchen items.  Mod cues for use of LUE when obtaining grocery items to sort into breakfast, lunch, and dinner.  Pt requiring increased time to identify items secondary to impaired vision.  Engaged in sit > stand with min assist and min assist at pelvis to maintain upright standing balance while returning items to pantry with use of LUE.  Engaged in ball toss in sitting and standing with focus on bimanual control.  Therapy Documentation Precautions:  Precautions Precautions: Fall Precaution Comments: Impulsive Restrictions Weight Bearing Restrictions: No Pain:  Pt with no c/o pain  See Function Navigator for Current Functional Status.   Therapy/Group: Concurrent  Rosalio LoudHOXIE, Elmer Merwin 04/25/2017, 3:36 PM

## 2017-04-26 ENCOUNTER — Inpatient Hospital Stay (HOSPITAL_COMMUNITY): Payer: BLUE CROSS/BLUE SHIELD | Admitting: Physical Therapy

## 2017-04-26 ENCOUNTER — Inpatient Hospital Stay (HOSPITAL_COMMUNITY): Payer: BLUE CROSS/BLUE SHIELD | Admitting: Speech Pathology

## 2017-04-26 ENCOUNTER — Inpatient Hospital Stay (HOSPITAL_COMMUNITY): Payer: BLUE CROSS/BLUE SHIELD | Admitting: Occupational Therapy

## 2017-04-26 ENCOUNTER — Inpatient Hospital Stay (HOSPITAL_COMMUNITY): Payer: BLUE CROSS/BLUE SHIELD

## 2017-04-26 LAB — BASIC METABOLIC PANEL
Anion gap: 10 (ref 5–15)
BUN: 14 mg/dL (ref 6–20)
CALCIUM: 9.2 mg/dL (ref 8.9–10.3)
CHLORIDE: 103 mmol/L (ref 101–111)
CO2: 24 mmol/L (ref 22–32)
CREATININE: 0.57 mg/dL (ref 0.44–1.00)
GFR calc Af Amer: >60 mL/min (ref >60)
GFR calc non Af Amer: >60 mL/min (ref >60)
Glucose, Bld: 90 mg/dL (ref 65–99)
Potassium: 4.1 mmol/L (ref 3.5–5.1)
Sodium: 137 mmol/L (ref 135–145)

## 2017-04-26 LAB — CBC WITH DIFFERENTIAL/PLATELET
BASOS PCT: 0 %
Basophils Absolute: 0 10*3/uL (ref 0.0–0.1)
EOS PCT: 1 %
Eosinophils Absolute: 0.1 10*3/uL (ref 0.0–0.7)
HEMATOCRIT: 36.5 % (ref 36.0–46.0)
Hemoglobin: 10.8 g/dL — ABNORMAL LOW (ref 12.0–15.0)
LYMPHS ABS: 4 10*3/uL (ref 0.7–4.0)
Lymphocytes Relative: 27 %
MCH: 24.5 pg — AB (ref 26.0–34.0)
MCHC: 29.6 g/dL — AB (ref 30.0–36.0)
MCV: 83 fL (ref 78.0–100.0)
MONO ABS: 1.2 10*3/uL — AB (ref 0.1–1.0)
Monocytes Relative: 8 %
NEUTROS ABS: 9.6 10*3/uL — AB (ref 1.7–7.7)
Neutrophils Relative %: 64 %
Platelets: 369 10*3/uL (ref 150–400)
RBC: 4.4 MIL/uL (ref 3.87–5.11)
RDW: 16.9 % — AB (ref 11.5–15.5)
WBC: 14.9 10*3/uL — ABNORMAL HIGH (ref 4.0–10.5)

## 2017-04-26 MED ORDER — NAPHAZOLINE-GLYCERIN 0.012-0.2 % OP SOLN
1.0000 [drp] | Freq: Three times a day (TID) | OPHTHALMIC | Status: DC
  Administered 2017-04-26: 1 [drp] via OPHTHALMIC
  Administered 2017-04-26: 2 [drp] via OPHTHALMIC
  Administered 2017-04-27 – 2017-04-29 (×2): 1 [drp] via OPHTHALMIC
  Administered 2017-04-30: 2 [drp] via OPHTHALMIC
  Filled 2017-04-26 (×2): qty 15

## 2017-04-26 NOTE — Progress Notes (Signed)
[place] PHYSICAL MEDICINE & REHABILITATION     PROGRESS NOTE    Subjective/Complaints: Appreciate ophthalmology note. No headache today.  ROS: pt denies nausea, vomiting, diarrhea, cough, shortness of breath or chest pain   Objective: Vital Signs: Blood pressure 130/81, pulse 61, temperature 98.6 F (37 C), temperature source Oral, resp. rate 18, height 5\' 4"  (1.626 m), SpO2 99 %. No results found.  Recent Labs  04/26/17 0527  WBC 14.9*  HGB 10.8*  HCT 36.5  PLT 369    Recent Labs  04/26/17 0527  NA 137  K 4.1  CL 103  GLUCOSE 90  BUN 14  CREATININE 0.57  CALCIUM 9.2   CBG (last 3)  No results for input(s): GLUCAP in the last 72 hours.  Wt Readings from Last 3 Encounters:  04/07/17 65.8 kg (145 lb 1 oz)  02/08/12 51.7 kg (114 lb)  01/21/12 52.4 kg (115 lb 8.3 oz)    Physical Exam:  Constitutional: She appears well-developed. No distress.  HENT:  Mouth/Throat: Oropharynx is clear and moist.  Eyes:  Left peri-orbital ecchymosis, lid swelling  Neck: No tracheal deviationpresent. No thyromegalypresent.  Cardiovascular: Normal rate. Exam reveals no gallopand no friction rub.  No murmurheard. Respiratory: No respiratory distress. She has no wheezes. She has no rales.  GI: Soft. She exhibits no distension. There is no tenderness.  Neurological:  Right facial weakness is persistent. Speech clear. . Left lid ptosis. struggles with horizontal tracking as well as superiorly. Has Left  Eye patch Ataxia right upper and lower extremity. Right pronator drift. Strength 4/5 RUE prox to distal and RLE prox to distal. 4+ LUE and LLE. Fair insight and awareness. Follows commands.  Psychiatric: She has a normal mood and affect. Her behavior is normal.   Skin: sutures/wounds clean on scalp  Assessment/Plan: 1. Functional, mobility deficits  secondary to [facility] which require 3+ hours per day of interdisciplinary therapy in a comprehensive inpatient rehab  setting. Physiatrist is providing close team supervision and 24 hour management of active medical problems listed below. Physiatrist and rehab team continue to assess barriers to discharge/monitor patient progress toward functional and medical goals.  Function:  Bathing Bathing position   Position: Shower  Bathing parts Body parts bathed by patient: Right arm, Left arm, Chest, Abdomen, Front perineal area, Right upper leg, Left upper leg, Buttocks, Left lower leg Body parts bathed by helper: Left lower leg, Right lower leg, Buttocks  Bathing assist Assist Level: Touching or steadying assistance(Pt > 75%)      Upper Body Dressing/Undressing Upper body dressing   What is the patient wearing?: Pull over shirt/dress     Pull over shirt/dress - Perfomed by patient: Thread/unthread right sleeve, Thread/unthread left sleeve, Put head through opening, Pull shirt over trunk Pull over shirt/dress - Perfomed by helper: Put head through opening, Pull shirt over trunk        Upper body assist Assist Level:  (50%, moderate assistance)      Lower Body Dressing/Undressing Lower body dressing   What is the patient wearing?: Non-skid slipper socks, Pants, Underwear, Socks Underwear - Performed by patient: Thread/unthread right underwear leg, Thread/unthread left underwear leg, Pull underwear up/down Underwear - Performed by helper: Pull underwear up/down Pants- Performed by patient: Thread/unthread right pants leg, Thread/unthread left pants leg, Pull pants up/down Pants- Performed by helper: Pull pants up/down   Non-skid slipper socks- Performed by helper: Don/doff left sock, Don/doff right sock  Lower body assist Assist for lower body dressing: Touching or steadying assistance (Pt > 75%)      Toileting Toileting Toileting activity did not occur: No continent bowel/bladder event Toileting steps completed by patient: Adjust clothing prior to toileting, Performs perineal  hygiene Toileting steps completed by helper: Adjust clothing after toileting Toileting Assistive Devices: Grab bar or rail  Toileting assist Assist level: Touching or steadying assistance (Pt.75%)   Transfers Chair/bed transfer   Chair/bed transfer method: Stand pivot Chair/bed transfer assist level: Touching or steadying assistance (Pt > 75%) Chair/bed transfer assistive device: Armrests, Patent attorneyWalker     Locomotion Ambulation     Max distance: 50 Assist level: Touching or steadying assistance (Pt > 75%)   Wheelchair   Type: Manual Max wheelchair distance: 25 Assist Level: Supervision or verbal cues  Cognition Comprehension Comprehension assist level: Follows basic conversation/direction with no assist  Expression Expression assist level: Expresses basic needs/ideas: With no assist  Social Interaction Social Interaction assist level: Interacts appropriately 75 - 89% of the time - Needs redirection for appropriate language or to initiate interaction.  Problem Solving Problem solving assist level: Solves basic 75 - 89% of the time/requires cueing 10 - 24% of the time  Memory Memory assist level: Recognizes or recalls 75 - 89% of the time/requires cueing 10 - 24% of the time   Medical Problem List and Plan: 1. 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 -continue CIR therapies PT, OT, speech  2. DVT Prophylaxis/Anticoagulation: Mechanical: Sequential compression devices, below knee Bilateral lower extremities 3. Headaches/Pain Management:   -add prn tramadol  -topamax 25mg  bid , Increased to 50 mg , no headaches reported today 4. Mood: LCSW to follow for evaluation and support.  5. Neuropsych: This patient iscapable of making decisions on herown behalf. 6. Skin/Wound Care: Routine pressure relief measures.  7. Fluids/Electrolytes/Nutrition: Monitor I/O. Encourage PO  --potassium wnl  8. HTN: nimotop taper for  vasospasms  -prn clonidine  -Mainly elevated in a.m., trial doxazosin daily at bedtime, looks improved this morning Vitals:   04/25/17 1612 04/26/17 0517  BP: 137/66 130/81  Pulse: 73 61  Resp: 18 18  Temp: 98.5 F (36.9 C) 98.6 F (37 C)   9. 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 ABLA: Hgb 11.0--> 10.8 On 5/18 12. Leukocytosis: WBC's down to 14.5k 5/18  -afebrile, lungs remain clear, wounds clean  -UA +, UCX >100K ecoli S to  amoxil  -steroid component to elevation as well.     .  13. Constipation: Augment bowel program.  LOS (Days) 7 A FACE TO FACE EVALUATION WAS PERFORMED  Erick ColaceKIRSTEINS,Roderick Calo E, MD 04/26/2017 5:12 PM

## 2017-04-26 NOTE — Progress Notes (Signed)
Occupational Therapy Session Note  Patient Details  Name: [name] Barnett MRN: [id number] Date of Birth: 01/23/1976  Today's Date: 04/26/2017 OT Individual Time: [id number] OT Individual Time Calculation (min): 41 min    Short Term Goals: Week 1:  OT Short Term Goal 1 (Week 1): Demo consistent use of LUE at diminished level during BADL with min vc OT Short Term Goal 2 (Week 1): Don pants, sitting and standing with Mod assist to pull up pants OT Short Term Goal 3 (Week 1): Pt will complete stand pivot transfer to toilet with min A  Skilled Therapeutic Interventions/Progress Updates:    1:1. Focus of session on RW management, standing balance, funcitonal use of LUE, and attention. Pt easily distracted when speaking and trying to ambulate requiring VC to focus on tasks. Pt ambulates throughout session with MIN A for balance and VC for RW management with turning/trnasfers, weight shifting, not scissoring legs, and safety awareness. Pt transfer onto toilet with MIN A and VC as stated above to void bladder. Pt able to complete hygiene/clothing management with touching A for balance. On TTB, pt bathes all body parts with VC for thoroughness/sequencing of bathing body parts and A for washing back. Pt dresses seated EOB with Vc to scan to find clothing on L and incorporate LUE in donning clohting. Pt requires steadying A to balance while advancing pants past hips and Vc for weight shifting. Exited session with pt supine in bed, call light in reach and bed exit alarm on.   Therapy Documentation Precautions:  Precautions Precautions: Fall Precaution Comments: Impulsive Restrictions Weight Bearing Restrictions: No   ADL: ADL ADL Comments: Please see functional navigator  See Function Navigator for Current Functional Status.   Therapy/Group: Individual Therapy  Shon HaleStephanie M Briscoe Daniello 04/26/2017, 9:11 AM

## 2017-04-26 NOTE — Progress Notes (Signed)
Speech Language Pathology Daily Session Note  Patient Details  Name: [name] Barnett MRN: [id number] Date of Birth: 03/01/1976  Today's Date: 04/26/2017 SLP Individual Time: [id number] SLP Individual Time Calculation (min): 26 min  Short Term Goals: Week 1: SLP Short Term Goal 1 (Week 1): Patient will consume current diet without overt s/s of aspiration and supervision verbal cues for use of cin tuck. SLP Short Term Goal 2 (Week 1): Patient will recall new, daily information with Mod I.  SLP Short Term Goal 3 (Week 1): Patient will demonstrate complex problem solving with Mod I.  SLP Short Term Goal 4 (Week 1): Patient will identify activities that she will need assistance with at discharge with supervision verbal cues.  SLP Short Term Goal 5 (Week 1): Patient will attend to LUE during functional tasks with Min A verbal cues.   Skilled Therapeutic Interventions: Skilled treatment session focused on cognitive goals. Patient continues to report decreased visual acuity which impacts her functional independence. Patient unable to read daily schedule, therefore, SLP created a larger, bolder daily schedule that patient could utilize with extra time and Min A verbal cues. Patient sitting EOB while reading schedule and would often lose her balance posteriorly due to inability to divide attention between sitting balance and decoding. Patient also required Min A verbal cues to attend to LUE while transferring from supine to EOB. Patient left supine in bed with all needs within reach. Continue with current plan of care.    Function:  Eating Eating                 Cognition Comprehension Comprehension assist level: Follows basic conversation/direction with no assist  Expression   Expression assist level: Expresses basic needs/ideas: With no assist  Social Interaction Social Interaction assist level: Interacts appropriately 75 - 89% of the time - Needs redirection for appropriate language or to  initiate interaction.  Problem Solving Problem solving assist level: Solves basic 75 - 89% of the time/requires cueing 10 - 24% of the time  Memory Memory assist level: Recognizes or recalls 75 - 89% of the time/requires cueing 10 - 24% of the time    Pain No/Denies Pain   Therapy/Group: Individual Therapy  [name] 04/26/2017, 1:50 PM

## 2017-04-26 NOTE — Progress Notes (Signed)
Physical Therapy Weekly Progress Note  Patient Details  Name: [name] Barnett MRN: [id number] Date of Birth: 02-23-1976  Beginning of progress report period: Apr 20, 2017 End of progress report period: Apr 26, 2017  Today's Date: 04/26/2017 PT Individual Time: 1445-1530 PT Individual Time Calculation (min): 45 min   Patient has met 5 of 5 short term goals.  Pt has made progress with all mobility this session, but is becoming more limited with low vision, requiring increasing cues for navigation.  Have begun to work on compensatory strategies for low vision with mobility.  Patient continues to demonstrate the following deficits impaired timing and sequencing, unbalanced muscle activation, motor apraxia, ataxia, decreased coordination and decreased motor planning, decreased visual acuity, decreased visual perceptual skills and retinal hemorrhage, decreased attention to left and decreased standing balance, decreased postural control, hemiplegia and decreased balance strategies and therefore will continue to benefit from skilled PT intervention to increase functional independence with mobility.  Patient progressing toward long term goals..  Continue plan of care.  PT Short Term Goals Week 1:  PT Short Term Goal 1 (Week 1): Pt will increase bed mobility to S.  PT Short Term Goal 1 - Progress (Week 1): Met PT Short Term Goal 2 (Week 1): Pt will increase transfers to min A.  PT Short Term Goal 2 - Progress (Week 1): Met PT Short Term Goal 3 (Week 1): Pt will increase ambulation with LRAD to min A about 150 feet.  PT Short Term Goal 3 - Progress (Week 1): Met PT Short Term Goal 4 (Week 1): Pt will ascend/descend 4 stairs with B rails and min A PT Short Term Goal 4 - Progress (Week 1): Met PT Short Term Goal 5 (Week 1): Pt will propel w/c about 150 feet with S.  PT Short Term Goal 5 - Progress (Week 1): Met Week 2:  PT Short Term Goal 1 (Week 2): =LTGs due to ELOS  Skilled Therapeutic  Interventions/Progress Updates:    no c/o pain, but reporting worsening vision throughout the last week.  Session focus on compensatory strategies for low vision, LE strengthening, and NMR.    PT applied yellow coband to w/c wheel locks and and walker handles for improved ability to locate them.  Stair negotiation x4 steps with 2 rails and min assist with mod verbal cues for sequencing, foot placement, and hand placement.  Gait x50' with RW and min assist for steering RW.  NMR in hook lying with 2x10 reps ball squeezes, LLE support bridges, and trunk rotation.  Pt returned to room at end of session and positioned upright in w/c with call bell in reach and needs met.   Therapy Documentation Precautions:  Precautions Precautions: Fall Precaution Comments: Impulsive Restrictions Weight Bearing Restrictions: No   See Function Navigator for Current Functional Status.  Therapy/Group: Individual Therapy  [name] 04/26/2017, 4:46 PM

## 2017-04-26 NOTE — Progress Notes (Signed)
Occupational Therapy Session Note  Patient Details  Name: [name] Barnett MRN: [id number] Date of Birth: [birth year]  Today's Date: 04/26/2017 OT Individual Time: 1102-1201 OT Individual Time Calculation (min): 59 min    Short Term Goals: Week 1:  OT Short Term Goal 1 (Week 1): Demo consistent use of LUE at diminished level during BADL with min vc OT Short Term Goal 2 (Week 1): Don pants, sitting and standing with Mod assist to pull up pants OT Short Term Goal 3 (Week 1): Pt will complete stand pivot transfer to toilet with min A  Skilled Therapeutic Interventions/Progress Updates:    OT treatment session focused on fine motor coordination, LB dressing, postural control, vision, and L NMR. Pt reported feeling very dizzy with blurry vision today, but agreeable to OT. Pt transferred to sitting EOB and was able to achieve center of balance using bed rail. Practiced B UE coordination with shoe tying task. First, just practiced on lap, with increased time, VC, and multiple tried, pt eventually able to tie shoe. Increased task demand by donning shoe and tying shoes in figure four position. Increased difficulty with task, requiring min A and multiple trials. L NMR and fine motor graded peg board task in tall kneeling. Pt with increased difficulty locating pegs and holes in peg board 2/2 visual deficits. Focused on achieving pincer grasp on pegs and manual facilitation for normal movement patterns. Pt returned to room and completed stand-pivot transfer to recliner with min/Mod A. Pt left with needs met.   Therapy Documentation Precautions:  Precautions Precautions: Fall Precaution Comments: Impulsive Restrictions Weight Bearing Restrictions: No Pain: Pain Assessment Pain Assessment: No/denies pain ADL: ADL ADL Comments: Please see functional navigator  See Function Navigator for Current Functional Status.   Therapy/Group: Individual Therapy  Valma Cava 04/26/2017, 12:17 PM

## 2017-04-26 NOTE — Consult Note (Signed)
OPHTHALMOLOGY FOLLOW UP NOTE.   S:  Worsening vision OU. Feels like complete vision loss has occurred. She can see a little better in the morning but it worsens throughout the day. Before just the left eye was seeing poorly, now the right eye doesn't see well either. No longer complains of diplopia. The vision is too poor to see. I can see people across the room, but not details.   O [facility]: OD: CF @3ft ; 20/400 equivalent at near.  EOM: Left Third nerve palsy.  External: Left perioribital ecchymosis clearing.  Pupils: 5mm to 3OU without APD OU.   SLE LL: Wnl C/S: White and quiet OD, Trace subconj heme OS.  K: Clear OU AC: Deep and quiet OU Iris: Unremarkable OU Lens: Trace NSC OU  Posterior POle:  Vitreous Hemorrhage OU.  Periphery obscured, No obvious detachment. Appears flat.    Assesment and Plan:  Bilateral Vitreous Hemorrhage Associated with SAH.  Tersons Syndrome.  - Vitreous hemorrhage causing obstructed vision. I reassured her that vitreous hemorrhage will likely clear with time or may require surgery to "washout" the hemorrhage.   - Recommend observation in the setting of recent [facility] and coiling. When an outpatient, can consider vitrectomy to relieve vitreous hemorrhage.   Mack HookAndrew Lovely Kerins, M.D.  [facility] [address] [place], [place] [address] [phone](o) [phone] ([phone]) [phone]

## 2017-04-26 NOTE — Progress Notes (Signed)
Speech Language Pathology Weekly Progress and Session Note  Patient Details  Name: [name] Barnett MRN: [id number] Date of Birth: 10-18-1976  Beginning of progress report period: Apr 19, 2017 End of progress report period: Apr 26, 2017  Today's Date: 04/26/2017 SLP Individual Time: 1400-1430 SLP Individual Time Calculation (min): 30 min  Short Term Goals: Week 1: SLP Short Term Goal 1 (Week 1): Patient will consume current diet without overt s/s of aspiration and supervision verbal cues for use of cin tuck. SLP Short Term Goal 1 - Progress (Week 1): Met SLP Short Term Goal 2 (Week 1): Patient will recall new, daily information with Mod I.  SLP Short Term Goal 2 - Progress (Week 1): Not met SLP Short Term Goal 3 (Week 1): Patient will demonstrate complex problem solving with Mod I.  SLP Short Term Goal 3 - Progress (Week 1): Not met SLP Short Term Goal 4 (Week 1): Patient will identify activities that she will need assistance with at discharge with supervision verbal cues.  SLP Short Term Goal 4 - Progress (Week 1): Met SLP Short Term Goal 5 (Week 1): Patient will attend to LUE during functional tasks with Min A verbal cues.  SLP Short Term Goal 5 - Progress (Week 1): Met    New Short Term Goals: Week 2: SLP Short Term Goal 1 (Week 2): Patient will consume current diet without overt s/s of aspiration and Mod I for use of chin tuck. SLP Short Term Goal 2 (Week 2): Patient will recall new, daily information with Mod I.  SLP Short Term Goal 3 (Week 2): Patient will demonstrate complex problem solving with Mod I.  SLP Short Term Goal 4 (Week 2): Patient will attend to LUE during functional tasks with Supervision verbal cues.  SLP Short Term Goal 5 (Week 2): Patient will identify activities that she will need assistance with at discharge with Mod I.  Weekly Progress Updates: Patient has made functional gains and has met 3 of 5 STG's this reporting period. Currently, patient is consuming  regular textures with thin liquids and requires supervision verbal cues for use of swallowing compensatory strategies. Patient also requires overall Supervision-Min A verbal cues to complete functional and familiar tasks safely in regards to problem solving, recall and attention to LUE. Patient continues to be limited by decreased visual acuity. Patient and family education is ongoing and patient would benefit from continued skilled SLP intervention to maximize her cognitive and swallowing function and overall functional independence prior to discharge.      Intensity: Minumum of 1-2 x/day, 30 to 90 minutes Frequency: 3 to 5 out of 7 days Duration/Length of Stay: 5/26 Treatment/Interventions: Cognitive remediation/compensation;Dysphagia/aspiration precaution training;Cueing hierarchy;Internal/external aids;Functional tasks;Patient/family education;Therapeutic Activities;Environmental controls   Daily Session  Skilled Therapeutic Interventions: Skilled treatment session focused on cognitive goals. Patient continues to report that she feels her visual acutiy is getting worse with her husband present and also voicing concern, RN and PA are aware and addressing. Patient transferred to wheelchair from recliner with Min verbal cues needed due to visual deficits. Patient and husband participated in a functional conversation that focused on anticipatory awareness. Patient reported she feels she needs to "be more realistic" in regards to home and activities she can participate in safely and was able to generate a list of safe and unsafe activities with supervision verbal cues. Patient left upright in wheelchair with all needs within reach and husband present. Continue with current plan of care.      Function:  Cognition Comprehension Comprehension assist level: Follows basic conversation/direction with no assist  Expression   Expression assist level: Expresses basic needs/ideas: With no assist  Social  Interaction Social Interaction assist level: Interacts appropriately 75 - 89% of the time - Needs redirection for appropriate language or to initiate interaction.  Problem Solving Problem solving assist level: Solves basic 75 - 89% of the time/requires cueing 10 - 24% of the time  Memory Memory assist level: Recognizes or recalls 75 - 89% of the time/requires cueing 10 - 24% of the time   Pain No Reports of pain   Therapy/Group: Individual Therapy  Eimi Viney, [place] 04/26/2017, 3:09 PM

## 2017-04-27 ENCOUNTER — Inpatient Hospital Stay (HOSPITAL_COMMUNITY): Payer: BLUE CROSS/BLUE SHIELD | Admitting: Occupational Therapy

## 2017-04-27 NOTE — Plan of Care (Signed)
Problem: RH BOWEL ELIMINATION Goal: RH STG MANAGE BOWEL WITH ASSISTANCE STG Manage Bowel with min Assistance.   Outcome: Not Progressing Patient refuses other laxatives miralax given this am ; LBM 5/13.

## 2017-04-27 NOTE — Progress Notes (Signed)
Occupational Therapy Session Note  Patient Details  Name: [name] Barnett MRN: [id number] Date of Birth: 07/31/1976  Today's Date: 04/27/2017 OT Individual Time: [id number] OT Individual Time Calculation (min): 45 min    Short Term Goals: Week 1:  OT Short Term Goal 1 (Week 1): Demo consistent use of LUE at diminished level during BADL with min vc OT Short Term Goal 2 (Week 1): Don pants, sitting and standing with Mod assist to pull up pants OT Short Term Goal 3 (Week 1): Pt will complete stand pivot transfer to toilet with min A  Skilled Therapeutic Interventions/Progress Updates: Patient initially refused OT and stated, "I cannot do anything because I cannot see.   The doctor said the blood behind my eyes has to process through, but I hate not being able to see.   Sometimes it makes me dizzy or nauseous."    Patient stated she could rely on her memory of the room since her vision was gone and concurred to participated as follow:  Supine to edge of bed= close S ;    EOB to w/c transfer= CGA due to patient decreased vision; oral care at sink=cues to tactilely locate items and turn on wate;     Patient was left seated in wc with call bell in place at the end of the session     Therapy Documentation Precautions:  Precautions Precautions: Fall Precaution Comments: Impulsive Restrictions Weight Bearing Restrictions: No Pain:denied  Vision and Perception  Patient stated, "The last 2 days I cannot much except bright colors.  The doctor said the blood behindmy eyes has to process through, but I do not like not being able to see."    Therapy/Group: Individual Therapy  Bud Faceickett, [name] Barnett 04/27/2017, 6:25 PM

## 2017-04-27 NOTE — Progress Notes (Signed)
[place] PHYSICAL MEDICINE & REHABILITATION     PROGRESS NOTE    Subjective/Complaints: Patient with fluctuating visual complaints, she sees shadows today. 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 rate 18, height 5\' 4"  (1.626 m), SpO2 99 %. No results found.  Recent Labs  04/26/17 0527  WBC 14.9*  HGB 10.8*  HCT 36.5  PLT 369    Recent Labs  04/26/17 0527  NA 137  K 4.1  CL 103  GLUCOSE 90  BUN 14  CREATININE 0.57  CALCIUM 9.2   CBG (last 3)  No results for input(s): GLUCAP in the last 72 hours.  Wt Readings from Last 3 Encounters:  04/07/17 65.8 kg (145 lb 1 oz)  02/08/12 51.7 kg (114 lb)  01/21/12 52.4 kg (115 lb 8.3 oz)    Physical Exam:  Constitutional: She appears well-developed. No distress.  HENT:  Mouth/Throat: Oropharynx is clear and moist.  Eyes:  Left peri-orbital ecchymosis, lid swelling, Unable to read large print   Neck: No tracheal deviationpresent. No thyromegalypresent.  Cardiovascular: Normal rate. Exam reveals no gallopand no friction rub.  No murmurheard. Respiratory: No respiratory distress. She has no wheezes. She has no rales.  GI: Soft. She exhibits no distension. There is no tenderness.  Neurological:  Right facial weakness is persistent. Speech clear. . Left lid ptosis. struggles with horizontal tracking as well as superiorly. Has Left  Eye patch Ataxia right upper and lower extremity. Right pronator drift. Strength 4/5 RUE prox to distal and RLE prox to distal. 4+ LUE and LLE. Fair insight and awareness. Follows commands.  Psychiatric: She has a normal mood and affect. Her behavior is normal.   Skin: sutures/wounds clean on scalp  Assessment/Plan: 1. Functional, mobility deficits  [facility] hours  per day of interdisciplinary therapy in a comprehensive inpatient rehab setting. Physiatrist is providing close team supervision and 24 hour management of active medical problems listed below. Physiatrist and rehab team continue to assess barriers to discharge/monitor patient progress toward functional and medical goals.  Function:  Bathing Bathing position   Position: Shower  Bathing parts Body parts bathed by patient: Right arm, Left arm, Chest, Abdomen, Front perineal area, Right upper leg, Left upper leg, Buttocks, Left lower leg Body parts bathed by helper: Left lower leg, Right lower leg, Buttocks  Bathing assist Assist Level: Touching or steadying assistance(Pt > 75%)      Upper Body Dressing/Undressing Upper body dressing   What is the patient wearing?: Pull over shirt/dress     Pull over shirt/dress - Perfomed by patient: Thread/unthread right sleeve, Thread/unthread left sleeve, Put head through opening, Pull shirt over trunk Pull over shirt/dress - Perfomed by helper: Put head through opening, Pull shirt over trunk        Upper body assist Assist Level:  (50%, moderate assistance)      Lower Body Dressing/Undressing Lower body dressing   What is the patient wearing?: Non-skid slipper socks, Pants, Underwear, Socks Underwear - Performed by patient: Thread/unthread right underwear leg, Thread/unthread left underwear leg, Pull underwear up/down Underwear - Performed by helper: Pull underwear up/down Pants- Performed by patient: Thread/unthread right pants leg, Thread/unthread left pants leg, Pull pants up/down Pants- Performed by helper: Pull pants up/down   Non-skid slipper socks- Performed by helper: Don/doff left  sock, Don/doff right sock                  Lower body assist Assist for lower body dressing: Touching or steadying assistance (Pt > 75%)      Toileting Toileting Toileting activity did not occur: No continent bowel/bladder event Toileting steps  completed by patient: Adjust clothing prior to toileting, Performs perineal hygiene Toileting steps completed by helper: Adjust clothing after toileting Toileting Assistive Devices: Grab bar or rail  Toileting assist Assist level: Touching or steadying assistance (Pt.75%)   Transfers Chair/bed transfer   Chair/bed transfer method: Stand pivot Chair/bed transfer assist level: Touching or steadying assistance (Pt > 75%) Chair/bed transfer assistive device: Armrests, Patent attorney     Max distance: 50 Assist level: Touching or steadying assistance (Pt > 75%)   Wheelchair   Type: Manual Max wheelchair distance: 25 Assist Level: Supervision or verbal cues  Cognition Comprehension Comprehension assist level: Follows basic conversation/direction with no assist  Expression Expression assist level: Expresses basic needs/ideas: With no assist  Social Interaction Social Interaction assist level: Interacts appropriately 75 - 89% of the time - Needs redirection for appropriate language or to initiate interaction.  Problem Solving Problem solving assist level: Solves basic 50 - 74% of the time/requires cueing 25 - 49% of the time  Memory Memory assist level: Recognizes or recalls 75 - 89% of the time/requires cueing 10 - 24% of the time   Medical Problem List and Plan: 1. 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 -continue CIR therapies PT, OT, speech  2. DVT Prophylaxis/Anticoagulation: Mechanical: Sequential compression devices, below knee Bilateral lower extremities 3. Headaches/Pain Management:   -add prn tramadol  -topamax 25mg  bid , Increased to 50 mg , no headaches reported today 4. Mood: LCSW to follow for evaluation and support.  5. Neuropsych: This patient iscapable of making decisions on herown behalf. 6. Skin/Wound Care: Routine pressure relief measures.  7. Fluids/Electrolytes/Nutrition: Monitor  I/O. Encourage PO  --potassium wnl  8. HTN: nimotop taper for vasospasms  -prn clonidine  -Mainly elevated in a.m., trial doxazosin daily at bedtime, Systolic elevated again this morning Vitals:   04/26/17 2007 04/27/17 0550  BP: 126/69 (!) 154/88  Pulse: 62 68  Resp:  18  Temp:  98 F (36.7 C)   9. 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 ABLA: Hgb 11.0--> 10.8 On 5/18 12. Leukocytosis: WBC's down to 14.5k 5/18  -afebrile, lungs remain clear, wounds clean  -UA +, UCX >100K ecoli S to  amoxil, Discontinue in a.m.  -steroid component to elevation as well.     .  13. Constipation: Augment bowel program.  LOS (Days) 8 A FACE TO FACE EVALUATION WAS PERFORMED  Erick Colace, MD 04/27/2017 10:46 AM

## 2017-04-28 ENCOUNTER — Inpatient Hospital Stay (HOSPITAL_COMMUNITY): Payer: BLUE CROSS/BLUE SHIELD

## 2017-04-28 NOTE — Progress Notes (Signed)
04/28/17 1430 nsg Per patient she had 3 stools for the day. Miralax held this morning. Patient requesting for Imodium MD notified. No orders for loose stools for now but to increase fluid intake. RN relayed message to patient.

## 2017-04-28 NOTE — Progress Notes (Signed)
Occupational Therapy Session Note  Patient Details  Name: [name] Barnett MRN: [id number] Date of Birth: 01/07/1976  Today's Date: 04/28/2017 OT Individual Time: 0700-0800 OT Individual Time Calculation (min): 60 min    Short Term Goals: Week 1:  OT Short Term Goal 1 (Week 1): Demo consistent use of LUE at diminished level during BADL with min vc OT Short Term Goal 2 (Week 1): Don pants, sitting and standing with Mod assist to pull up pants OT Short Term Goal 3 (Week 1): Pt will complete stand pivot transfer to toilet with min A  Skilled Therapeutic Interventions/Progress Updates:    1:1 session focus on standing balance, ambulation with/without AD, functional transfers and LUE use in BADLs. Pt ambulates and completes all functional transfers EOB<>toilet<>TTB<>EOB with MIN A and Vc for RW management, safety awareness, no talkin gto eliminate distractions and L foot clearance. Pt demo occasional LOB posteriorly/laterally throughout session. Pt voids bowel and bladder while seated on toilet. Pt able to complete clothing management/hygiene with MIN A for balance and Vc for weight shifting. Pt bathes seated on TTB 10/10 body parts with MIN A for standing balance and VC for safety awareness. Pt dresses seated EOB and able to locate items on L by color and feeling requiring steadying A to while advancing pants past hips. Pt requires VC to incorporate LUE into donning footwear with supervision. Pt grooms in standing at sink with steadying A and VC to use tactile input to compensate for vision to locate items on sink. Exited session with pt supine in bed with call light in reach and bed exit alarm activated.   Therapy Documentation Precautions:  Precautions Precautions: Fall Precaution Comments: Impulsive Restrictions Weight Bearing Restrictions: No  ADL ADL Comments: Please see functional navigator  See Function Navigator for Current Functional Status.   Therapy/Group: Individual  Therapy  Shon HaleStephanie M Donnah Levert 04/28/2017, 9:50 AM

## 2017-04-28 NOTE — Plan of Care (Signed)
Problem: RH PAIN MANAGEMENT Goal: RH STG PAIN MANAGED AT OR BELOW PT'S PAIN GOAL Less than 3  Outcome: Not Progressing Consistently rates pain > 3.   

## 2017-04-28 NOTE — Progress Notes (Signed)
Anxious over lack of vision. PRN tylenol and 50mg  trazodone given at 2138. Reports best night sleep since hospital admit. [name] MartinezMurray, Jayln Branscom A

## 2017-04-28 NOTE — Progress Notes (Signed)
[place] PHYSICAL MEDICINE & REHABILITATION     PROGRESS NOTE    Subjective/Complaints: Continues with her visual complaints. No eye pain. No discharge. Still with poor coordination. Left upper and left lower limb. When she tries to move them  ROS: pt denies nausea, vomiting, diarrhea, cough, shortness of breath or chest pain   Objective: Vital Signs: Blood pressure 137/87, pulse 79, temperature 97.6 F (36.4 C), temperature source Oral, resp. rate 18, height 5\' 4"  (1.626 m), SpO2 99 %. No results found.  Recent Labs  04/26/17 0527  WBC 14.9*  HGB 10.8*  HCT 36.5  PLT 369    Recent Labs  04/26/17 0527  NA 137  K 4.1  CL 103  GLUCOSE 90  BUN 14  CREATININE 0.57  CALCIUM 9.2   CBG (last 3)  No results for input(s): GLUCAP in the last 72 hours.  Wt Readings from Last 3 Encounters:  04/07/17 65.8 kg (145 lb 1 oz)  02/08/12 51.7 kg (114 lb)  01/21/12 52.4 kg (115 lb 8.3 oz)    Physical Exam:  Constitutional: She appears well-developed. No distress.  HENT:  Mouth/Throat: Oropharynx is clear and moist.  Eyes:  Left peri-orbital ecchymosis, lid swelling, Unable to read large print   Neck: No tracheal deviationpresent. No thyromegalypresent.  Cardiovascular: Normal rate. Exam reveals no gallopand no friction rub.  No murmurheard. Respiratory: No respiratory distress. She has no wheezes. She has no rales.  GI: Soft. She exhibits no distension. There is no tenderness.  Neurological:  Right facial weakness is persistent. Speech clear. . Left lid ptosis. struggles with horizontal tracking as well as superiorly. Has Left  Eye patch Ataxia right upper and lower extremity. Right pronator drift. Strength 4/5 RUE prox to distal and RLE prox to distal. 4+ LUE and LLE. Fair insight and awareness. Follows commands.  Psychiatric: She has a normal mood and affect. Her behavior is normal.   Skin: sutures/wounds clean on scalp  Assessment/Plan: 1. Functional,  mobility deficits  secondary to [facility] which require 3+ hours per day of interdisciplinary therapy in a comprehensive inpatient rehab setting. Physiatrist is providing close team supervision and 24 hour management of active medical problems listed below. Physiatrist and rehab team continue to assess barriers to discharge/monitor patient progress toward functional and medical goals.  Function:  Bathing Bathing position   Position: Shower  Bathing parts Body parts bathed by patient: Right arm, Left arm, Chest, Abdomen, Front perineal area, Right upper leg, Left upper leg, Buttocks, Left lower leg Body parts bathed by helper: Left lower leg, Right lower leg, Buttocks  Bathing assist Assist Level: Touching or steadying assistance(Pt > 75%)      Upper Body Dressing/Undressing Upper body dressing   What is the patient wearing?: Pull over shirt/dress     Pull over shirt/dress - Perfomed by patient: Thread/unthread right sleeve, Thread/unthread left sleeve, Put head through opening, Pull shirt over trunk Pull over shirt/dress - Perfomed by helper: Put head through opening, Pull shirt over trunk        Upper body assist Assist Level:  (50%, moderate assistance)      Lower Body Dressing/Undressing Lower body dressing   What is the patient wearing?: Non-skid slipper socks, Pants, Underwear, Socks Underwear - Performed by patient: Thread/unthread right underwear leg, Thread/unthread left underwear leg, Pull underwear up/down Underwear - Performed by helper: Pull underwear up/down Pants- Performed by patient: Thread/unthread right pants leg, Thread/unthread left pants leg, Pull pants up/down Pants- Performed by helper: Pull  pants up/down   Non-skid slipper socks- Performed by helper: Don/doff left sock, Don/doff right sock                  Lower body assist Assist for lower body dressing: Touching or steadying assistance (Pt > 75%)      Toileting Toileting Toileting activity did not  occur: No continent bowel/bladder event Toileting steps completed by patient: Adjust clothing prior to toileting, Performs perineal hygiene Toileting steps completed by helper: Adjust clothing after toileting Toileting Assistive Devices: Grab bar or rail  Toileting assist Assist level: Touching or steadying assistance (Pt.75%)   Transfers Chair/bed transfer   Chair/bed transfer method: Stand pivot Chair/bed transfer assist level: Touching or steadying assistance (Pt > 75%) Chair/bed transfer assistive device: Armrests, Patent attorneyWalker     Locomotion Ambulation     Max distance: 50 Assist level: Touching or steadying assistance (Pt > 75%)   Wheelchair   Type: Manual Max wheelchair distance: 25 Assist Level: Supervision or verbal cues  Cognition Comprehension Comprehension assist level: Follows complex conversation/direction with extra time/assistive device  Expression Expression assist level: Expresses complex ideas: With extra time/assistive device  Social Interaction Social Interaction assist level: Interacts appropriately with others - No medications needed.  Problem Solving Problem solving assist level: Solves basic 90% of the time/requires cueing < 10% of the time  Memory Memory assist level: More than reasonable amount of time   Medical Problem List and Plan: 1. 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 -continue CIR therapies PT, OT, speech,  visual impairment slowing progress 2. DVT Prophylaxis/Anticoagulation: Mechanical: Sequential compression devices, below knee Bilateral lower extremities 3. Headaches/Pain Management:   -add prn tramadol  -topamax 25mg  bid , Increased to 50 mg , no headaches reported today 4. Mood: LCSW to follow for evaluation and support.  5. Neuropsych: This patient iscapable of making decisions on herown behalf. 6. Skin/Wound Care: Routine pressure relief measures.  7.  Fluids/Electrolytes/Nutrition: Monitor I/O. Encourage PO  --potassium wnl  8. HTN: nimotop taper for vasospasms  -prn clonidine  -Mainly elevated in a.m., trial doxazosin daily at bedtime, am reading improving Vitals:   04/27/17 2132 04/28/17 0500  BP: 136/72 137/87  Pulse:  79  Resp:  18  Temp:  97.6 F (36.4 C)   9. 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 ABLA: Hgb 11.0--> 10.8 On 5/18 12. Leukocytosis: WBC's down to 14.5k 5/18  -afebrile, lungs remain clear, wounds clean  -UA +, UCX >100K ecoli S to  amoxil, Discontinue in a.m.  -steroid component to elevation as well.     .  13. Constipation: Augment bowel program.  LOS (Days) 9 A FACE TO FACE EVALUATION WAS PERFORMED  Erick ColaceKIRSTEINS,ANDREW E, MD 04/28/2017 11:31 AM

## 2017-04-29 ENCOUNTER — Inpatient Hospital Stay (HOSPITAL_COMMUNITY): Payer: BLUE CROSS/BLUE SHIELD | Admitting: Speech Pathology

## 2017-04-29 ENCOUNTER — Inpatient Hospital Stay (HOSPITAL_COMMUNITY): Payer: BLUE CROSS/BLUE SHIELD

## 2017-04-29 MED ORDER — TROPICAMIDE 0.5 % OP SOLN
2.0000 [drp] | Freq: Once | OPHTHALMIC | Status: DC
  Filled 2017-04-29: qty 15

## 2017-04-29 MED ORDER — TROPICAMIDE 1 % OP SOLN
2.0000 [drp] | Freq: Once | OPHTHALMIC | Status: AC
  Administered 2017-04-29: 2 [drp] via OPHTHALMIC
  Filled 2017-04-29: qty 2

## 2017-04-29 NOTE — Consult Note (Signed)
[facility]                                                                               04/29/2017                                            Retina Consultation                                         Consult requested by: Dr. Mack HookAndrew Mincey  Reason for consultation:  Decreased vision and vitreous hemorrhage in both eyes  HPI: History of subarachnoid hemorrhage with associated Terson's syndrome after coiling and subsequent hemorrhage.  After development of Terson's syndrome the patient had break through bleeding and vision loss from bilateral vitreous hemorrhage.  Pertinent Medical History:   Active Ambulatory Problems    Diagnosis Date Noted  . S/P laparoscopic cholecystectomy 10/16/2011  . Duodenal ulcer, acute with obstruction 10/17/2011  . Nausea & vomiting 01/20/2012  . Epigastric pain 01/20/2012  . Hypokalemia 01/21/2012  . Elevated gastrin level 01/25/2012  . 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 Basilar artery aneurysm ([name])   . Hypoxia   . SAH (subarachnoid hemorrhage) ([name]) 04/19/2017   Resolved Ambulatory Problems    Diagnosis Date Noted  . HTN (hypertension) 10/16/2011  . Nausea and vomiting 10/16/2011  . Hypokalemia 10/16/2011  . Acute renal failure ([name]) 10/16/2011  . Tobacco abuse 10/16/2011   Past Medical History:  Diagnosis Date  . Duodenal obstruction   . GERD (gastroesophageal reflux disease)   . 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 butalbital-acetaminophen-caffeine (FIORICET, ESGIC) 50-325-40 MG tablet Take 1-2 tablets by mouth every 6 (six) hours as needed for headache. 14 tablet 0  . feeding supplement (RESOURCE BREEZE) LIQD Take 1 Container by mouth 2 (two) times daily with a meal. (Patient not taking: Reported on 04/07/2017)    . ibuprofen  (ADVIL,MOTRIN) 200 MG tablet Take 200 mg by mouth every 6 (six) hours as needed for moderate pain.    [name]. niMODipine (NIMOTOP) 30 MG capsule Take 2 capsules (60 mg total) by mouth every 4 (four) hours.    . pantoprazole (PROTONIX) 40 MG tablet Take 1 tablet (40 mg total) by mouth 2 (two) times daily before a meal. 60 tablet 1  . predniSONE (STERAPRED UNI-PAK 21 TAB) 10 MG (21) TBPK tablet Take tapering dose per package instructions          ROS: noncontributory  Visual Fields: FTC OU       Pupils:  Pharmacologically dilated at my direction before exam   Equal, brisk, no APD      Near acuity:   [place]  20/800    OD       CF OS    TA:  OD: 15 mmhg    OS: 15 mmhg  by Tonopen    Dilation:  both eyes        Medication used  Tropicamide External:   OD:  Normal      OS:  Normal      Anterior segment exam:  By penlight    Conjunctiva:  OD:  Quiet      OS:  Quiet     Cornea:    OD: Clear, no fluorescein stain       OS: Clear, no fluorescein stain      Anterior Chamber:   OD:  Deep/quiet      OS:  Deep/quiet     Iris:    OD:  Normal       OS:  Normal      Lens:    OD:  Clear         OS:  Clear          Optic disc:  OD:  Hazy view    OS:  No view      Central retina--examined with indirect ophthalmoscope:  OD:  Very hazy view   OS:  No view  Peripheral retina--examined with indirect ophthalmoscope with lid speculum and scleral depression:   OD:  Limited view  OS:  No view  Impression:   Vitreous hemorrhage associated with Terson's syndrome OS>OD.  Prior to development of breakthrough bleeding, the right eye visual acuity was normal.  The left eye visual acuity was 20/70 with associated 3rd nerve palsy.   Recommendations/Plan:  PPVx OD with endolaser to allow patient to regain vision and progress with rehabilitation.  I've discussed these findings with the nurse and/or resident. Please contact our office with any questions or concerns at [phone]. Thank you  for calling us to care for this both eyes .  Harrold Donath

## 2017-04-29 NOTE — Consult Note (Signed)
Neuropsychological Consultation   Patient:   [name] Barnett   DOB:   12/29/1975  MR Number:  [id number]  [facility]  [facility] [address] [id number] [place] [id number] Dept: [phone] Loc: [phone]           Date of Service:   04/29/2017  Start Time:   10:30 AM End Time:   11:30 AM  Provider/Observer:  Arley PhenixJohn Rodenbough, Psy.D.       Clinical Neuropsychologist       Billing Code/Service: [phone] 4 Units  Chief Complaint:    The patient was referred for neuropsychological/psychological consultation due to adjustment issues primarily related to stress following subarachnoid hemorrhage affecting her vision. The patient described feeling as if she was "crazy" and not herself. The patient reports that it is very stressful not being able to see her surroundings and cope with this vision changes. She reports that she is having headaches frequently and feels very dizzy. She also describes numbness on her face which constantly distracts her.  Reason for Service: [name] Barnett is a 41 year old female that was admitted on April 07, 2017 with increasingly worsening headache, with loss of consciousness and fall. Radiological studies revealed diffuse subarachnoid hemorrhage. After neurosurgical interventions, the patient did develop some hypoxia with increased lethargy what appeared to be due to a L-SCA stroke. Below you will find the history of present illness from the current admission.    [name] Barnett a 40 y.o.femalewith history of HTN, GERD who was admitted on 04/07/17 with worsening of HA, episode of emesis followed by LOC with fall and LLE weakness. 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 She was taken to OR by Dr. Conchita ParisNundkumar and underwent coil embolization of  basilar artery aneurysm.[name]. 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 EVD placed at bedside with improvement in LOC and IS used to help improve low lung volumes. Patient with resultant balance deficits, left gaze palsy with decreased vision Left >Right as well as persistentheadaches . 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 EVD removed 5/10  MBS done due to reports of coughing with liquids and chin tuck recommended with liquids to prevent aspiration. She has had reports of changes in vision and opthalmology evaluation showed evidence of bilateral preretinal/vitreous hemorrhage associated with SAH. Dr. Genia DelMincey indicates that vitreous hemorrhage should resolve over next several weeks and no signs of retinal tear or detachment--call for worsening of vision/  Therapy ongoing and patient with ataxic gait with balance deficits, tendency to lean to the left, decrease in visual acuity as well as proprioceptive deficits. CIR recommended due to significant deficits in mobility and ability to perform ADL tasks.   Current Status:  The patient reports that her vision continues to be severely impaired. She reports that while it is improved recently she still only sees outlines and shapes of people and objects and things tend to have a blue tent.  The patient reports that this has a disorienting effect on her as well as the dizziness and headaches. The patient reports that she feels "crazy" because of this disorienting factor.  Reliability of Information: Information is provided by one our clinical interview with the patient as well  as review of available medical records and face-to-face consultation with treatment team members.  Behavioral Observation: [name]  presents as a 41 y.o.-year-old  Caucasian Female who appeared her stated age. her  dress was Appropriate and she was Well Groomed and her manners were Appropriate to the situation.  her participation was indicative of Appropriate and Attentive behaviors.  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  she displayed an appropriate level of cooperation and motivation.     Interactions:    Active Appropriate and Attentive  Attention:   within normal limits and attention span and concentration were age appropriate  Memory:   within normal limits; recent and remote memory intact  Visuo-spatial:  abnormal  due to primary vision deficits. Actual visual spatial abilities were not able to be assessed.  Speech (Volume):  normal  Speech:   normal;   Thought Process:  Coherent and Relevant  Though Content:  WNL; not suicidal and not homicidal  Orientation:   person, place, time/date and situation  Judgment:   Good  Planning:   Good  Affect:    Anxious  Mood:    Anxious  Insight:   Good  Intelligence:   normal  Medical History:   Past Medical History:  Diagnosis Date  . Duodenal obstruction   . GERD (gastroesophageal reflux disease)   . Hypertension           Family Med/Psych History:  Family History  Problem Relation Age of Onset  . Malignant hyperthermia Neg Hx     Risk of Suicide/Violence: low The patient denies SI or HI  Impression/DX:  [name] Barnett is a 41 year old female that was admitted on April 07, 2017 with increasingly worsening headache, with loss of consciousness and fall. Radiological studies revealed diffuse subarachnoid hemorrhage. After neurosurgical interventions, the patient did develop some hypoxia with increased lethargy what appeared to be due to a L-SCA stroke. Below you will find the history of present illness from the current admission.  The patient was referred for neuropsychological/psychological consultation due to adjustment issues primarily related to stress following subarachnoid hemorrhage affecting her  vision. The patient described feeling as if she was "crazy" and not herself. The patient reports that it is very stressful not being able to see her surroundings and cope with this vision changes. She reports that she is having headaches frequently and feels very dizzy. She also describes numbness on her face which constantly distracts her.  Disposition/Plan:  Will see the patient again this week per her request.  The patient is adjusting better but still stressed by vision limitations and fear that she will not be getting better.  Diagnosis:    Subarachnoid hemorrhage due to ruptured aneurysm [facility]) - Plan: Ambulatory referral to Physical Medicine Rehab  Basilar artery aneurysm [facility]) - Plan: Ambulatory referral to Physical Medicine Rehab         Electronically Signed   _______________________ Arley Phenix, Psy.D.

## 2017-04-29 NOTE — Progress Notes (Signed)
Speech Language Pathology Daily Session Note  Patient Details  Name: [name] Barnett MRN: [id number] Date of Birth: 03/18/1976  Today's Date: 04/29/2017 SLP Individual Time: 0830-0900 SLP Individual Time Calculation (min): 30 min  Short Term Goals: Week 2: SLP Short Term Goal 1 (Week 2): Patient will consume current diet without overt s/s of aspiration and Mod I for use of chin tuck. SLP Short Term Goal 2 (Week 2): Patient will recall new, daily information with Mod I.  SLP Short Term Goal 3 (Week 2): Patient will demonstrate complex problem solving with Mod I.  SLP Short Term Goal 4 (Week 2): Patient will attend to LUE during functional tasks with Supervision verbal cues.  SLP Short Term Goal 5 (Week 2): Patient will identify activities that she will need assistance with at discharge with Mod I.  Skilled Therapeutic Interventions: Skilled treatment session focused on dysphagia and cognitive goals. SLP facilitated session by providing Min verbal cues for use of chin tuck with thin liquids to reduce overt s/s of aspiration during breakfast meal of regular textures with thin liquids. Patient also required supervision verbal cues to navigate/locate items on tray due to visual impairments and for correct orientation of shirt during basic self-care tasks. Patient left upright in wheelchair with all needs within reach. Continue with current plan of care.      Function:  Eating Eating   Modified Consistency Diet: No Eating Assist Level: Set up assist for;Supervision or verbal cues   Eating Set Up Assist For: Opening containers;Cutting food       Cognition Comprehension Comprehension assist level: Follows basic conversation/direction with extra time/assistive device  Expression   Expression assist level: Expresses basic needs/ideas: With extra time/assistive device  Social Interaction Social Interaction assist level: Interacts appropriately 90% of the time - Needs monitoring or encouragement  for participation or interaction.  Problem Solving Problem solving assist level: Solves basic 50 - 74% of the time/requires cueing 25 - 49% of the time  Memory Memory assist level: Recognizes or recalls 75 - 89% of the time/requires cueing 10 - 24% of the time    Pain No/Denies Pain   Therapy/Group: Individual Therapy  Haider Hornaday 04/29/2017, 3:00 PM

## 2017-04-29 NOTE — Progress Notes (Signed)
Physical Therapy Note  Patient Details  Name: [name] Barnett MRN: [id number] Date of Birth: 11/12/1976 Today's Date: 04/29/2017  1415-1530, 75 min individual tx Pain: 5/10 HA  Sitting EOB, pt donned socks in timely manner, and shoes with extra time and max cues to slow down and visualize process of tieing laces.  W/c footrests raised, and w/c back added to decrease sacral sitting and provide support for trunk. Blocked practice for w/c brake management using tactile cues for hand position on thighs before reaching laterally for brakes.  neuromuscular re-education via forced use on NuStep at level 4 x 5 minutes with bil LEs only, focusing on neutral hip alignment and avoid internal rotation. With bil UEs and bil LEs x 3 minutes at level 4, encouraging trunk rotation. Seated reciprocal scooting without use of UEs for core activation.  Pt became dizzy when scanning for bright cones PT had placed at end of room for gait target; resolved when pt closed eyes for less than 1 minute.  Gait with HHA , PT in front of pt, x 50' including 1 turn, with mod assist, mod cues.  Pt left resting in w/c with quick release belt applied and all needs within reach.  See function navigator for current status.   Alesi Zachery 04/29/2017, 12:26 PM

## 2017-04-29 NOTE — Progress Notes (Signed)
Tylenol and trazodone given at HS. Constantly complains of HA,PRN ultram given. Frustrated with vision. Martina SinnerMurray, Jesly Hartmann A, RN

## 2017-04-29 NOTE — Progress Notes (Signed)
[place] PHYSICAL MEDICINE & REHABILITATION     PROGRESS NOTE    Subjective/Complaints: Several loose stools after laxative,hadn't had BM for ~1 wk,  pt requested anti diarrheal.  Vision is about the same. She is able to identify my hand and reach out and touch it. She had some difficulty identifying how many fingers were placed at 15 inches.   Working with speech therapy currently.  ROS: pt denies nausea, vomiting,+ diarrhea, no cough, shortness of breath or chest pain   Objective: Vital Signs: Blood pressure 120/68, pulse 80, temperature 97.8 F (36.6 C), temperature source Oral, resp. rate 18, height 5\' 4"  (1.626 m), SpO2 100 %. No results found. No results for input(s): WBC, HGB, HCT, PLT in the last 72 hours. No results for input(s): NA, K, CL, GLUCOSE, BUN, CREATININE, CALCIUM in the last 72 hours.  Invalid input(s): CO CBG (last 3)  No results for input(s): GLUCAP in the last 72 hours.  Wt Readings from Last 3 Encounters:  04/07/17 65.8 kg (145 lb 1 oz)  02/08/12 51.7 kg (114 lb)  01/21/12 52.4 kg (115 lb 8.3 oz)    Physical Exam:  Constitutional: She appears well-developed. No distress.  HENT:  Mouth/Throat: Oropharynx is clear and moist.  Eyes:  Left peri-orbital ecchymosis, lid swelling, Unable to read large print   Neck: No tracheal deviationpresent. No thyromegalypresent.  Cardiovascular: Normal rate. Exam reveals no gallopand no friction rub.  No murmurheard. Respiratory: No respiratory distress. She has no wheezes. She has no rales.  GI: Soft. She exhibits no distension. There is no tenderness.  Neurological:  Right facial weakness is persistent. Speech clear. . Left lid ptosis. struggles with horizontal tracking as well as superiorly. Has Left  Eye patch Ataxia right upper and lower extremity. Right pronator drift. Strength 4/5 RUE prox to distal and RLE prox to distal. 4+ LUE and LLE. Fair insight and awareness. Follows commands.  Psychiatric:  She has a normal mood and affect. Her behavior is normal.   Skin: wounds clean on scalp  Assessment/Plan: 1. Functional, mobility deficits  secondary to [facility] which require 3+ hours per day of interdisciplinary therapy in a comprehensive inpatient rehab setting. Physiatrist is providing close team supervision and 24 hour management of active medical problems listed below. Physiatrist and rehab team continue to assess barriers to discharge/monitor patient progress toward functional and medical goals.  Function:  Bathing Bathing position   Position: Shower  Bathing parts Body parts bathed by patient: Right arm, Left arm, Chest, Abdomen, Front perineal area, Right upper leg, Left upper leg, Buttocks, Left lower leg Body parts bathed by helper: Left lower leg, Right lower leg, Buttocks  Bathing assist Assist Level: Touching or steadying assistance(Pt > 75%)      Upper Body Dressing/Undressing Upper body dressing   What is the patient wearing?: Pull over shirt/dress     Pull over shirt/dress - Perfomed by patient: Thread/unthread right sleeve, Thread/unthread left sleeve, Put head through opening, Pull shirt over trunk Pull over shirt/dress - Perfomed by helper: Put head through opening, Pull shirt over trunk        Upper body assist Assist Level:  (50%, moderate assistance)      Lower Body Dressing/Undressing Lower body dressing   What is the patient wearing?: Non-skid slipper socks, Pants, Underwear, Socks Underwear - Performed by patient: Thread/unthread right underwear leg, Thread/unthread left underwear leg, Pull underwear up/down Underwear - Performed by helper: Pull underwear up/down Pants- Performed by patient: Thread/unthread right pants  leg, Thread/unthread left pants leg, Pull pants up/down Pants- Performed by helper: Pull pants up/down   Non-skid slipper socks- Performed by helper: Don/doff left sock, Don/doff right sock                  Lower body assist Assist  for lower body dressing: Touching or steadying assistance (Pt > 75%)      Toileting Toileting Toileting activity did not occur: No continent bowel/bladder event Toileting steps completed by patient: Adjust clothing prior to toileting, Performs perineal hygiene Toileting steps completed by helper: Adjust clothing after toileting Toileting Assistive Devices: Grab bar or rail  Toileting assist Assist level: Touching or steadying assistance (Pt.75%)   Transfers Chair/bed transfer   Chair/bed transfer method: Stand pivot Chair/bed transfer assist level: Touching or steadying assistance (Pt > 75%) Chair/bed transfer assistive device: Armrests, Patent attorneyWalker     Locomotion Ambulation     Max distance: 50 Assist level: Touching or steadying assistance (Pt > 75%)   Wheelchair   Type: Manual Max wheelchair distance: 25 Assist Level: Supervision or verbal cues  Cognition Comprehension Comprehension assist level: Follows basic conversation/direction with no assist  Expression Expression assist level: Expresses basic needs/ideas: With no assist  Social Interaction Social Interaction assist level: Interacts appropriately 75 - 89% of the time - Needs redirection for appropriate language or to initiate interaction.  Problem Solving Problem solving assist level: Solves basic 50 - 74% of the time/requires cueing 25 - 49% of the time  Memory Memory assist level: Recognizes or recalls 75 - 89% of the time/requires cueing 10 - 24% of the time   Medical Problem List and Plan: 1. 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 -continue CIR therapies PT, OT, speech,   2. DVT Prophylaxis/Anticoagulation: Mechanical: Sequential compression devices, below knee Bilateral lower extremities 3. Headaches/Pain Management:   -add prn tramadol  -topamax 25mg  bid , Increased to 50 mg , no headaches reported today 4. Mood: LCSW to follow for evaluation  and support.  5. Neuropsych: This patient iscapable of making decisions on herown behalf. 6. Skin/Wound Care: Routine pressure relief measures.  7. Fluids/Electrolytes/Nutrition: Monitor I/O. Encourage PO  --potassium wnl  8. HTN: nimotop taper for vasospasms  -prn clonidine  -Mainly elevated in a.m., trial doxazosin daily at bedtime, am reading improving Vitals:   04/28/17 0500 04/28/17 1430  BP: 137/87 120/68  Pulse: 79 80  Resp: 18 18  Temp: 97.6 F (36.4 C) 97.8 F (36.6 C)   9. 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 ABLA: Hgb 11.0--> 10.8 On 5/18 12. Leukocytosis: WBC's down to 14.5k 5/18  -afebrile, lungs remain clear, wounds clean  -UTI ecoli completed amoxil,  -steroid component to elevation as well.     .  13. Constipation: Cleaned out, avoid anti diarrheals to avoid further constipation,Change miralax to prn LOS (Days) 10 A FACE TO FACE EVALUATION WAS PERFORMED  Erick ColaceKIRSTEINS,ANDREW E, MD 04/29/2017 6:29 AM

## 2017-04-29 NOTE — Progress Notes (Signed)
Occupational Therapy Session Note  Patient Details  Name: [name] Barnett MRN: [id number] Date of Birth: 06/23/1976  Today's Date: 04/29/2017 OT Individual Time: 7829-56210900-0958 OT Individual Time Calculation (min): 58 min    Short Term Goals: Week 1:  OT Short Term Goal 1 (Week 1): Demo consistent use of LUE at diminished level during BADL with min vc OT Short Term Goal 2 (Week 1): Don pants, sitting and standing with Mod assist to pull up pants OT Short Term Goal 3 (Week 1): Pt will complete stand pivot transfer to toilet with min A  Skilled Therapeutic Interventions/Progress Updates:    Focus on LUE gross motor and [facility] tasks.  Pt engaged in BUE use with theraputty for gross motor tasks.  Pt also used theraputty to remove small beads from theraputty with focus on grasping beads and placing in container.  Pt also required to replace beads into putty.  Pt continues to exhibit visual deficits but appropriately relies on touch with hands to locate items.  Pt verbalized frustration with visual deficites and LUE deficits but continued to work through challenges. Pt remained in w/c with all needs within reach.   Therapy Documentation Precautions:  Precautions Precautions: Fall Precaution Comments: Impulsive Restrictions Weight Bearing Restrictions: No Pain: Pain Assessment Pain Score: Asleep  See Function Navigator for Current Functional Status.   Therapy/Group: Individual Therapy  [name] Barnett, [name] Barnett 04/29/2017, 10:00 AM

## 2017-04-29 NOTE — Progress Notes (Signed)
Speech Language Pathology Daily Session Note  Patient Details  Name: [name] Barnett MRN: [id number] Date of Birth: 11/05/1976  Today's Date: 04/29/2017 SLP Individual Time: 8295-62131134-1204 SLP Individual Time Calculation (min): 30 min  Short Term Goals: Week 2: SLP Short Term Goal 1 (Week 2): Patient will consume current diet without overt s/s of aspiration and Mod I for use of chin tuck. SLP Short Term Goal 2 (Week 2): Patient will recall new, daily information with Mod I.  SLP Short Term Goal 3 (Week 2): Patient will demonstrate complex problem solving with Mod I.  SLP Short Term Goal 4 (Week 2): Patient will attend to LUE during functional tasks with Supervision verbal cues.  SLP Short Term Goal 5 (Week 2): Patient will identify activities that she will need assistance with at discharge with Mod I.  Skilled Therapeutic Interventions: Skilled treatment session focused on addressing dysphagia and cognition goals. SLP facilitated session by providing Min assist with tray set-up to cut foods and move distracting items from tray.  Patient then consumed regular textures and thin liquids with Min A verbal cues to recall use of chin tuck in the moment which effectively prevented overt s/s of aspiration with meal.  Patient appropriately asked if bites were a good size and where to locate items on tray that she could not locate.  Continue with current plan of care.   Function:  Eating Eating   Modified Consistency Diet: No Eating Assist Level: Set up assist for;Supervision or verbal cues   Eating Set Up Assist For: Opening containers;Cutting food       Cognition Comprehension Comprehension assist level: Follows basic conversation/direction with extra time/assistive device  Expression   Expression assist level: Expresses basic needs/ideas: With extra time/assistive device  Social Interaction Social Interaction assist level: Interacts appropriately 90% of the time - Needs monitoring or encouragement  for participation or interaction.  Problem Solving Problem solving assist level: Solves basic 50 - 74% of the time/requires cueing 25 - 49% of the time  Memory Memory assist level: Recognizes or recalls 75 - 89% of the time/requires cueing 10 - 24% of the time    Pain Pain Assessment Pain Assessment: 0-10 Pain Score: 3  Pain Type: Acute pain Pain Location: Neck Pain Orientation: Mid Pain Descriptors / Indicators: Aching Patients Stated Pain Goal: 1 Pain Intervention(s): Repositioned;RN made aware Multiple Pain Sites: No  Therapy/Group: Individual Therapy  [name] FerrettiMelissa Ramiz Barnett, M.A., CCC-SLP [phone]  [name] Barnett 04/29/2017, 12:08 PM

## 2017-04-29 NOTE — Plan of Care (Signed)
Problem: RH PAIN MANAGEMENT Goal: RH STG PAIN MANAGED AT OR BELOW PT'S PAIN GOAL Less than 3  Outcome: Not Progressing Consistently rates pain > 3.

## 2017-04-30 ENCOUNTER — Inpatient Hospital Stay (HOSPITAL_COMMUNITY): Payer: BLUE CROSS/BLUE SHIELD

## 2017-04-30 ENCOUNTER — Inpatient Hospital Stay (HOSPITAL_COMMUNITY): Payer: BLUE CROSS/BLUE SHIELD | Admitting: Speech Pathology

## 2017-04-30 ENCOUNTER — Ambulatory Visit: Payer: Self-pay | Admitting: Ophthalmology

## 2017-04-30 ENCOUNTER — Inpatient Hospital Stay (HOSPITAL_COMMUNITY): Payer: BLUE CROSS/BLUE SHIELD | Admitting: Occupational Therapy

## 2017-04-30 NOTE — Progress Notes (Signed)
Speech Language Pathology Daily Session Note  Patient Details  Name: [name] Barnett MRN: [id number] Date of Birth: 07/07/1976  Today's Date: 04/30/2017 SLP Individual Time: 1130-1200 SLP Individual Time Calculation (min): 30 min  Short Term Goals: Week 2: SLP Short Term Goal 1 (Week 2): Patient will consume current diet without overt s/s of aspiration and Mod I for use of chin tuck. SLP Short Term Goal 2 (Week 2): Patient will recall new, daily information with Mod I.  SLP Short Term Goal 3 (Week 2): Patient will demonstrate complex problem solving with Mod I.  SLP Short Term Goal 4 (Week 2): Patient will attend to LUE during functional tasks with Supervision verbal cues.  SLP Short Term Goal 5 (Week 2): Patient will identify activities that she will need assistance with at discharge with Mod I.  Skilled Therapeutic Interventions: Skilled treatment session focused on addressing cognition goals. SLP facilitated session by providing set-up with a basic sorting task to address attention to and controlled use of left upper extremity, which patient completed with Min assist verbal cues to self-monitor and correct.  Patient also required Supervision level question cues to recall daily events accurately as confirmed by chart review.   Continue with current plan of care.    Function:  Cognition Comprehension Comprehension assist level: Follows basic conversation/direction with extra time/assistive device  Expression   Expression assist level: Expresses basic needs/ideas: With extra time/assistive device  Social Interaction Social Interaction assist level: Interacts appropriately 90% of the time - Needs monitoring or encouragement for participation or interaction.  Problem Solving Problem solving assist level: Solves basic 90% of the time/requires cueing < 10% of the time  Memory Memory assist level: Recognizes or recalls 90% of the time/requires cueing < 10% of the time    Pain Pain  Assessment Pain Assessment: No/denies pain  Therapy/Group: Individual Therapy  [name] Barnett, M.A., CCC-SLP [phone]  [name] Barnett 04/30/2017, 12:55 PM

## 2017-04-30 NOTE — H&P (Signed)
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 Duodenal obstruction   . GERD (gastroesophageal reflux disease)   . 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 Malignant hyperthermia Neg Hx     General:  Healthy appearing patient in no distress.   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    Plan: Vitrectomy with endolaser to treat hemorrhage and limit further bleeding/ vision loss.  Harrold DonathNarendra Mafabhai Audris Speaker

## 2017-04-30 NOTE — Progress Notes (Signed)
Speech Language Pathology Daily Session Note  Patient Details  Name: [name] Barnett MRN: [id number] Date of Birth: 03/24/1976  Today's Date: 04/30/2017 SLP Individual Time: 1300-1345 SLP Individual Time Calculation (min): 45 min  Short Term Goals: Week 2: SLP Short Term Goal 1 (Week 2): Patient will consume current diet without overt s/s of aspiration and Mod I for use of chin tuck. SLP Short Term Goal 2 (Week 2): Patient will recall new, daily information with Mod I.  SLP Short Term Goal 3 (Week 2): Patient will demonstrate complex problem solving with Mod I.  SLP Short Term Goal 4 (Week 2): Patient will attend to LUE during functional tasks with Supervision verbal cues.  SLP Short Term Goal 5 (Week 2): Patient will identify activities that she will need assistance with at discharge with Mod I.  Skilled Therapeutic Interventions: Skilled treatment session focused on dysphagia and cognitive goals. SLP facilitated session by providing supervision verbal cues for use of chin tuck with lunch meal of regular textures with thin liquids via straw which led to an intermittent wet vocal quality that cleared with cued throat clears. Patient also recalled events from previous therapy sessions/discussions with Mod I. Patient left upright in recliner with all needs within reach. Continue with current plan of care.      Function:  Eating Eating   Modified Consistency Diet: No Eating Assist Level: Set up assist for;Supervision or verbal cues   Eating Set Up Assist For: Opening containers;Cutting food       Cognition Comprehension Comprehension assist level: Follows basic conversation/direction with extra time/assistive device  Expression   Expression assist level: Expresses basic needs/ideas: With extra time/assistive device  Social Interaction Social Interaction assist level: Interacts appropriately 90% of the time - Needs monitoring or encouragement for participation or interaction.  Problem  Solving Problem solving assist level: Solves basic 90% of the time/requires cueing < 10% of the time  Memory Memory assist level: Recognizes or recalls 90% of the time/requires cueing < 10% of the time    Pain Pain Assessment Faces Pain Scale: Hurts even more Pain Type: Acute pain Pain Location: Head Pain Descriptors / Indicators: Aching Pain Intervention(s): Medication (See eMAR)  Therapy/Group: Individual Therapy  Danzell Birky 04/30/2017, 3:54 PM

## 2017-04-30 NOTE — Progress Notes (Signed)
Physical Therapy Session Note  Patient Details  Name: [name] Barnett MRN: [id number] Date of Birth: 03/11/1976  Today's Date: 04/30/2017 PT Individual Time: 1000-1100 PT Individual Time Calculation (min): 60 min   Short Term Goals: Week 2:  PT Short Term Goal 1 (Week 2): =LTGs due to ELOS  Skilled Therapeutic Interventions/Progress Updates:   Pt donned socks and shoes with extra time and verbal cues due to visual impairments. Pt performed basic transfers throughout session with min assist and mod verbal cues for technique and hand placement due to impaired postural control, visual impairments, and decreased coordination. Pt with 2 episodes requiring mod assist for recovery due to LOB and uncontrolled descent. nustep for neuro re-ed for reciprocal movement pattern re-training and coordination with focus on maintaining neutral alignment in BLE x 12 min on level 5. Gait with RW using 3 orange cones for visual targets and obstacle negotiation to prepare for home mobility requiring min to mod assist and max verbal cues. Pt able to recall and demonstrate successfully new technique for brake management with PT learned yesterday. Emotional support and encouragement provided throughout session.  Therapy Documentation Precautions:  Precautions Precautions: Fall Precaution Comments: Impulsive Restrictions Weight Bearing Restrictions: No   Pain: Reports intermittent pain in hip - premedicated.  See Function Navigator for Current Functional Status.   Therapy/Group: Individual Therapy  Karolee StampsGray, Leisel Pinette Darrol PokeBrescia  Delailah Spieth B. Shed Nixon, PT, DPT  04/30/2017, 11:21 AM

## 2017-04-30 NOTE — Progress Notes (Signed)
[facility]ICINE & REHABILITATION     PROGRESS NOTE    Subjective/Complaints: Appreciate retinal optho consult  Working with speech therapy currently.  ROS: pt denies nausea, vomiting,+ diarrhea, no cough, shortness of breath or chest pain   Objective: Vital Signs: Blood pressure 122/69, pulse 80, temperature 98.9 F (37.2 C), temperature source Oral, resp. rate 16, height 5\' 4"  (1.626 m), SpO2 100 %. No results found. No results for input(s): WBC, HGB, HCT, PLT in the last 72 hours. No results for input(s): NA, K, CL, GLUCOSE, BUN, CREATININE, CALCIUM in the last 72 hours.  Invalid input(s): CO CBG (last 3)  No results for input(s): GLUCAP in the last 72 hours.  Wt Readings from Last 3 Encounters:  04/07/17 65.8 kg (145 lb 1 oz)  02/08/12 51.7 kg (114 lb)  01/21/12 52.4 kg (115 lb 8.3 oz)    Physical Exam:  Constitutional: She appears well-developed. No distress.  HENT:  Mouth/Throat: Oropharynx is clear and moist.  Eyes:  Left peri-orbital ecchymosis, lid swelling, Unable to read large print   Neck: No tracheal deviationpresent. No thyromegalypresent.  Cardiovascular: Normal rate. Exam reveals no gallopand no friction rub.  No murmurheard. Respiratory: No respiratory distress. She has no wheezes. She has no rales.  GI: Soft. She exhibits no distension. There is no tenderness.  Neurological:  Right facial weakness is persistent. Speech clear. OSstruggles with horizontal tracking as well as superiorly.OD nystagmus Ataxia right upper and lower extremity. Right pronator drift. Strength 4/5 RUE prox to distal and RLE prox to distal. 4+ LUE and LLE. Fair insight and awareness. Follows commands.  Psychiatric: She has a normal mood and affect. Her behavior is normal.   Skin: wounds clean on scalp  Assessment/Plan: 1. Functional, mobility deficits  secondary to [facility] which require 3+ hours per day of interdisciplinary therapy in a comprehensive inpatient  rehab setting. Physiatrist is providing close team supervision and 24 hour management of active medical problems listed below. Physiatrist and rehab team continue to assess barriers to discharge/monitor patient progress toward functional and medical goals.  Function:  Bathing Bathing position   Position: Shower  Bathing parts Body parts bathed by patient: Right arm, Left arm, Chest, Abdomen, Front perineal area, Right upper leg, Left upper leg, Buttocks, Left lower leg Body parts bathed by helper: Left lower leg, Right lower leg, Buttocks  Bathing assist Assist Level: Touching or steadying assistance(Pt > 75%)      Upper Body Dressing/Undressing Upper body dressing   What is the patient wearing?: Pull over shirt/dress     Pull over shirt/dress - Perfomed by patient: Thread/unthread right sleeve, Thread/unthread left sleeve, Put head through opening, Pull shirt over trunk Pull over shirt/dress - Perfomed by helper: Put head through opening, Pull shirt over trunk        Upper body assist Assist Level:  (50%, moderate assistance)      Lower Body Dressing/Undressing Lower body dressing   What is the patient wearing?: Non-skid slipper socks, Pants, Underwear, Socks Underwear - Performed by patient: Thread/unthread right underwear leg, Thread/unthread left underwear leg, Pull underwear up/down Underwear - Performed by helper: Pull underwear up/down Pants- Performed by patient: Thread/unthread right pants leg, Thread/unthread left pants leg, Pull pants up/down Pants- Performed by helper: Pull pants up/down   Non-skid slipper socks- Performed by helper: Don/doff left sock, Don/doff right sock                  Lower body assist Assist for lower  body dressing: Touching or steadying assistance (Pt > 75%)      Toileting Toileting Toileting activity did not occur: No continent bowel/bladder event Toileting steps completed by patient: Adjust clothing prior to toileting, Performs  perineal hygiene Toileting steps completed by helper: Adjust clothing after toileting Toileting Assistive Devices: Grab bar or rail  Toileting assist Assist level: Supervision or verbal cues   Transfers Chair/bed transfer   Chair/bed transfer method: Stand pivot Chair/bed transfer assist level: Touching or steadying assistance (Pt > 75%) Chair/bed transfer assistive device: Armrests     Locomotion Ambulation     Max distance: 50 Assist level: Moderate assist (Pt 50 - 74%)   Wheelchair   Type: Manual Max wheelchair distance: 25 Assist Level: Supervision or verbal cues  Cognition Comprehension Comprehension assist level: Follows basic conversation/direction with extra time/assistive device  Expression Expression assist level: Expresses basic needs/ideas: With extra time/assistive device  Social Interaction Social Interaction assist level: Interacts appropriately 90% of the time - Needs monitoring or encouragement for participation or interaction.  Problem Solving Problem solving assist level: Solves basic 50 - 74% of the time/requires cueing 25 - 49% of the time  Memory Memory assist level: Recognizes or recalls 75 - 89% of the time/requires cueing 10 - 24% of the time   Medical Problem List and Plan: 1. 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 -continue CIR therapies PT, OT, speech,  Team conf in am 2. DVT Prophylaxis/Anticoagulation: Mechanical: Sequential compression devices, below knee Bilateral lower extremities 3. Headaches/Pain Management:   -add prn tramadol  -topamax 25mg  bid , Increased to 50 mg , no headaches reported today 4. Mood: LCSW to follow for evaluation and support.  5. Neuropsych: This patient iscapable of making decisions on herown behalf. 6. Skin/Wound Care: Routine pressure relief measures.  7. Fluids/Electrolytes/Nutrition: Monitor I/O. Encourage PO  --potassium wnl  8. HTN:  nimotop taper for vasospasms  -prn clonidine  -Mainly elevated in a.m., trial doxazosin daily at bedtime, am reading improved Vitals:   04/29/17 1604 04/30/17 0421  BP: 124/65 122/69  Pulse: 91 80  Resp: 18 16  Temp: 98.4 F (36.9 C) 98.9 F (37.2 C)   9. 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 ABLA: Hgb 11.0--> 10.8 On 5/18 12. Leukocytosis: WBC's down to 14.5k 5/18  -afebrile, lungs remain clear, wounds clean  -UTI ecoli completed amoxil,  -steroid component to elevation as well.     .  13. Constipation: Cleaned out, avoid anti diarrheals to avoid further constipation,Change miralax to prn LOS (Days) 11 A FACE TO FACE EVALUATION WAS PERFORMED  Erick Colace, MD 04/30/2017 6:36 AM

## 2017-04-30 NOTE — Progress Notes (Signed)
Occupational Therapy Weekly Progress Note  Patient Details  Name: [name] Barnett MRN: [id number] Date of Birth: 1976-08-06  Beginning of progress report period: Apr 20, 2017 End of progress report period: Apr 30, 2017  Today's Date: 04/30/2017  OT Individual Time: 0800-0900 OT Individual Time Calculation (min): 60 min    Patient has met 2 of 3 short term goals.  Pt has demonstrated improved sitting and standing balance within BADL tasks. She has also demonstrated improved use of L UE, but still requires VC at times to incorporate L UE in some tasks.   Patient continues to demonstrate the following deficits: muscle weakness, impaired timing and sequencing, unbalanced muscle activation, ataxia and decreased coordination, decreased visual acuity and decreased visual motor skills, decreased attention to left and decreased sitting balance, decreased standing balance, decreased postural control and decreased balance strategies and therefore will continue to benefit from skilled OT intervention to enhance overall performance with BADL.  Patient progressing toward long term goals..  Continue plan of care.  OT Short Term Goals Week 1:  OT Short Term Goal 1 (Week 1): Demo consistent use of LUE at diminished level during BADL with min vc OT Short Term Goal 1 - Progress (Week 1): Met OT Short Term Goal 2 (Week 1): Don pants, sitting and standing with Mod assist to pull up pants OT Short Term Goal 2 - Progress (Week 1): Met OT Short Term Goal 3 (Week 1): Pt will complete stand pivot transfer to toilet with min A OT Short Term Goal 3 - Progress (Week 1): Progressing toward goal Week 2:  OT Short Term Goal 1 (Week 2): STG=LTG 2/2 estimated LOS  Skilled Therapeutic Interventions/Progress Updates:    OT treatment session focused on low vision modifications, balance strategies, and functional use of L UE within BADL tasks. Pt ambulated to bathroom, transferred onto toilet with RW and overall Mod A + Mod VC  for positioning and RW placement. Pt voided bladder, completed toileting, then ambulated to transfer onto shower chair with mod A 2/2 lateral LOB and ataxia. L NMR incorporated into bathing tasks with VC. Dressing completed with Min A  Sit<>stand + VC to alternate UE's for balance when pulling up pants. Utilized red built up handles and discussed low vision techniques for self-feeding with improved ability to locate utensils. Pt left seated in recliner at end of session with needs met.   Therapy Documentation Precautions:  Precautions Precautions: Fall Precaution Comments: Impulsive Restrictions Weight Bearing Restrictions: No Pain: Pain Assessment Pain Assessment: 0-10 Pain Score: 3  Faces Pain Scale: Hurts even more Pain Type: Acute pain Pain Location: Head Pain Descriptors / Indicators: Headache Pain Onset: On-going Pain Intervention(s): Repositioned ADL: ADL ADL Comments: Please see functional navigator  See Function Navigator for Current Functional Status.   Therapy/Group: Individual Therapy  Valma Cava 04/30/2017, 4:17 PM

## 2017-05-01 ENCOUNTER — Inpatient Hospital Stay (HOSPITAL_COMMUNITY): Payer: BLUE CROSS/BLUE SHIELD | Admitting: Physical Therapy

## 2017-05-01 ENCOUNTER — Encounter (HOSPITAL_COMMUNITY)
Admission: RE | Disposition: A | Discharge status: Home Health | Payer: Self-pay | Source: Intra-hospital | Attending: Physical Medicine & Rehabilitation

## 2017-05-01 ENCOUNTER — Encounter (HOSPITAL_COMMUNITY): Payer: Self-pay | Admitting: Surgery

## 2017-05-01 ENCOUNTER — Inpatient Hospital Stay (HOSPITAL_COMMUNITY): Payer: BLUE CROSS/BLUE SHIELD | Admitting: Speech Pathology

## 2017-05-01 ENCOUNTER — Encounter (HOSPITAL_COMMUNITY): Payer: BLUE CROSS/BLUE SHIELD | Admitting: Psychology

## 2017-05-01 ENCOUNTER — Inpatient Hospital Stay (HOSPITAL_COMMUNITY): Payer: BLUE CROSS/BLUE SHIELD | Admitting: Certified Registered"

## 2017-05-01 ENCOUNTER — Inpatient Hospital Stay (HOSPITAL_COMMUNITY): Payer: BLUE CROSS/BLUE SHIELD | Admitting: Occupational Therapy

## 2017-05-01 ENCOUNTER — Ambulatory Visit (HOSPITAL_COMMUNITY): Admission: RE | Admit: 2017-05-01 | Payer: BLUE CROSS/BLUE SHIELD | Source: Ambulatory Visit | Admitting: Ophthalmology

## 2017-05-01 DIAGNOSIS — F4323 Adjustment disorder with mixed anxiety and depressed mood: Secondary | ICD-10-CM

## 2017-05-01 DIAGNOSIS — F32A Depression, unspecified: Secondary | ICD-10-CM

## 2017-05-01 DIAGNOSIS — F419 Anxiety disorder, unspecified: Secondary | ICD-10-CM

## 2017-05-01 HISTORY — PX: PARS PLANA VITRECTOMY: SHX2166

## 2017-05-01 LAB — SURGICAL PCR SCREEN
MRSA, PCR: NEGATIVE
STAPHYLOCOCCUS AUREUS: NEGATIVE

## 2017-05-01 SURGERY — PARS PLANA VITRECTOMY WITH 25 GAUGE
Anesthesia: Monitor Anesthesia Care | Site: Eye | Laterality: Right

## 2017-05-01 MED ORDER — HYDROMORPHONE HCL 1 MG/ML IJ SOLN: 0.2500 mg | INTRAMUSCULAR | Status: DC | PRN

## 2017-05-01 MED ORDER — PROPOFOL 10 MG/ML IV BOLUS
INTRAVENOUS | Status: DC | PRN
  Administered 2017-05-01: 30 mg via INTRAVENOUS
  Administered 2017-05-01: 20 mg via INTRAVENOUS

## 2017-05-01 MED ORDER — TROPICAMIDE 1 % OP SOLN
1.0000 [drp] | OPHTHALMIC | Status: DC | PRN
  Administered 2017-05-01: 1 [drp] via OPHTHALMIC
  Filled 2017-05-01: qty 2

## 2017-05-01 MED ORDER — EPINEPHRINE PF 1 MG/ML IJ SOLN
INTRAOCULAR | Status: DC | PRN
  Administered 2017-05-01: .3 mL

## 2017-05-01 MED ORDER — OFLOXACIN 0.3 % OP SOLN: 1.0000 [drp] | Freq: Four times a day (QID) | OPHTHALMIC | Status: DC

## 2017-05-01 MED ORDER — OFLOXACIN 0.3 % OP SOLN
1.0000 [drp] | OPHTHALMIC | Status: DC | PRN
  Administered 2017-05-01: 1 [drp] via OPHTHALMIC
  Filled 2017-05-01: qty 5

## 2017-05-01 MED ORDER — BUPIVACAINE HCL (PF) 0.75 % IJ SOLN
INTRAMUSCULAR | Status: AC
  Filled 2017-05-01: qty 10

## 2017-05-01 MED ORDER — LIDOCAINE HCL 2 % IJ SOLN
INTRAMUSCULAR | Status: DC | PRN
  Administered 2017-05-01: 6 mL via RETROBULBAR

## 2017-05-01 MED ORDER — TOBRAMYCIN-DEXAMETHASONE 0.3-0.1 % OP OINT
TOPICAL_OINTMENT | Freq: Two times a day (BID) | OPHTHALMIC | Status: DC
  Filled 2017-05-01: qty 3.5

## 2017-05-01 MED ORDER — HYALURONIDASE HUMAN 150 UNIT/ML IJ SOLN
INTRAMUSCULAR | Status: AC
  Filled 2017-05-01: qty 1

## 2017-05-01 MED ORDER — EPINEPHRINE PF 1 MG/ML IJ SOLN
INTRAMUSCULAR | Status: AC
  Filled 2017-05-01: qty 1

## 2017-05-01 MED ORDER — CEFAZOLIN 200 MG/ML FOR DIALYSIS
INJECTION | INTRAOCULAR | Status: DC | PRN
  Administered 2017-05-01: .5 mL

## 2017-05-01 MED ORDER — MIDAZOLAM HCL 5 MG/5ML IJ SOLN
INTRAMUSCULAR | Status: DC | PRN
  Administered 2017-05-01: 2 mg via INTRAVENOUS

## 2017-05-01 MED ORDER — OXYCODONE HCL 5 MG PO TABS: 5.0000 mg | ORAL_TABLET | Freq: Once | ORAL | Status: DC | PRN

## 2017-05-01 MED ORDER — DEXAMETHASONE SODIUM PHOSPHATE 10 MG/ML IJ SOLN
INTRAMUSCULAR | Status: DC | PRN
  Administered 2017-05-01: 10 mg

## 2017-05-01 MED ORDER — CEFAZOLIN SUBCONJUNCTIVAL INJECTION 100 MG/0.5 ML
100.0000 mg | INJECTION | SUBCONJUNCTIVAL | Status: AC
  Filled 2017-05-01: qty 5

## 2017-05-01 MED ORDER — FENTANYL CITRATE (PF) 250 MCG/5ML IJ SOLN
INTRAMUSCULAR | Status: AC
  Filled 2017-05-01: qty 5

## 2017-05-01 MED ORDER — SODIUM CHLORIDE 0.9 % IV SOLN: INTRAVENOUS | Status: DC

## 2017-05-01 MED ORDER — SUCCINYLCHOLINE CHLORIDE 200 MG/10ML IV SOSY
PREFILLED_SYRINGE | INTRAVENOUS | Status: AC
  Filled 2017-05-01: qty 10

## 2017-05-01 MED ORDER — PREDNISOLONE ACETATE 1 % OP SUSP: 1.0000 [drp] | Freq: Four times a day (QID) | OPHTHALMIC | 0 refills | Status: DC

## 2017-05-01 MED ORDER — MIDAZOLAM HCL 2 MG/2ML IJ SOLN
INTRAMUSCULAR | Status: AC
  Filled 2017-05-01: qty 2

## 2017-05-01 MED ORDER — PHENYLEPHRINE HCL 2.5 % OP SOLN
1.0000 [drp] | OPHTHALMIC | Status: DC | PRN
  Administered 2017-05-01: 1 [drp] via OPHTHALMIC
  Filled 2017-05-01: qty 2

## 2017-05-01 MED ORDER — SODIUM CHLORIDE 0.9 % IV SOLN
INTRAVENOUS | Status: DC | PRN
  Administered 2017-05-01: 16:00:00 via INTRAVENOUS

## 2017-05-01 MED ORDER — HYPROMELLOSE (GONIOSCOPIC) 2.5 % OP SOLN
OPHTHALMIC | Status: AC
  Filled 2017-05-01: qty 15

## 2017-05-01 MED ORDER — TOBRAMYCIN-DEXAMETHASONE 0.3-0.1 % OP OINT
TOPICAL_OINTMENT | Freq: Two times a day (BID) | OPHTHALMIC | Status: DC
  Administered 2017-05-01 – 2017-05-02 (×2): via OPHTHALMIC
  Administered 2017-05-02 – 2017-05-03 (×2): 1 via OPHTHALMIC
  Administered 2017-05-03 – 2017-05-05 (×5): via OPHTHALMIC
  Administered 2017-05-06: 1 via OPHTHALMIC
  Administered 2017-05-06 – 2017-05-07 (×2): via OPHTHALMIC
  Filled 2017-05-01: qty 3.5

## 2017-05-01 MED ORDER — LIDOCAINE 2% (20 MG/ML) 5 ML SYRINGE
INTRAMUSCULAR | Status: AC
  Filled 2017-05-01: qty 5

## 2017-05-01 MED ORDER — FENTANYL CITRATE (PF) 100 MCG/2ML IJ SOLN
INTRAMUSCULAR | Status: DC | PRN
  Administered 2017-05-01: 50 ug via INTRAVENOUS
  Administered 2017-05-01 (×2): 100 ug via INTRAVENOUS

## 2017-05-01 MED ORDER — HYPROMELLOSE (GONIOSCOPIC) 2.5 % OP SOLN
OPHTHALMIC | Status: DC | PRN
  Administered 2017-05-01: 25 [drp] via OPHTHALMIC

## 2017-05-01 MED ORDER — TOBRAMYCIN-DEXAMETHASONE 0.3-0.1 % OP OINT
TOPICAL_OINTMENT | OPHTHALMIC | Status: DC | PRN
  Administered 2017-05-01: 1 via OPHTHALMIC

## 2017-05-01 MED ORDER — BSS PLUS IO SOLN
INTRAOCULAR | Status: DC | PRN
  Administered 2017-05-01: 1 via INTRAOCULAR

## 2017-05-01 MED ORDER — PROMETHAZINE HCL 25 MG/ML IJ SOLN: 6.2500 mg | INTRAMUSCULAR | Status: DC | PRN

## 2017-05-01 MED ORDER — TETRACAINE HCL 0.5 % OP SOLN
OPHTHALMIC | Status: DC | PRN
  Administered 2017-05-01: 2 [drp] via OPHTHALMIC

## 2017-05-01 MED ORDER — TOBRAMYCIN-DEXAMETHASONE 0.3-0.1 % OP OINT
TOPICAL_OINTMENT | OPHTHALMIC | Status: AC
  Filled 2017-05-01: qty 3.5

## 2017-05-01 MED ORDER — PREDNISOLONE ACETATE 1 % OP SUSP
1.0000 [drp] | Freq: Four times a day (QID) | OPHTHALMIC | Status: DC
  Administered 2017-05-01 – 2017-05-06 (×21): 1 [drp] via OPHTHALMIC
  Filled 2017-05-01: qty 1

## 2017-05-01 MED ORDER — NAPHAZOLINE-GLYCERIN 0.012-0.2 % OP SOLN
1.0000 [drp] | Freq: Three times a day (TID) | OPHTHALMIC | Status: DC
  Administered 2017-05-01 – 2017-05-03 (×8): 2 [drp] via OPHTHALMIC
  Administered 2017-05-03 – 2017-05-05 (×7): 1 [drp] via OPHTHALMIC
  Administered 2017-05-06: 2 [drp] via OPHTHALMIC
  Administered 2017-05-06: 1 [drp] via OPHTHALMIC
  Administered 2017-05-06 (×2): 2 [drp] via OPHTHALMIC
  Administered 2017-05-07: 1 [drp] via OPHTHALMIC
  Filled 2017-05-01: qty 15

## 2017-05-01 MED ORDER — TETRACAINE HCL 0.5 % OP SOLN
OPHTHALMIC | Status: AC
  Filled 2017-05-01: qty 2

## 2017-05-01 MED ORDER — MUPIROCIN 2 % EX OINT
TOPICAL_OINTMENT | Freq: Once | CUTANEOUS | Status: AC
  Administered 2017-05-01: 15:00:00 via NASAL
  Filled 2017-05-01: qty 22

## 2017-05-01 MED ORDER — DEXAMETHASONE SODIUM PHOSPHATE 10 MG/ML IJ SOLN
INTRAMUSCULAR | Status: AC
  Filled 2017-05-01: qty 1

## 2017-05-01 MED ORDER — OFLOXACIN 0.3 % OP SOLN
1.0000 [drp] | Freq: Four times a day (QID) | OPHTHALMIC | Status: DC
  Administered 2017-05-01 – 2017-05-06 (×21): 1 [drp] via OPHTHALMIC
  Filled 2017-05-01: qty 5

## 2017-05-01 MED ORDER — ATROPINE SULFATE 1 % OP SOLN
OPHTHALMIC | Status: DC | PRN
  Administered 2017-05-01: 1 [drp] via OPHTHALMIC

## 2017-05-01 MED ORDER — BSS PLUS IO SOLN
INTRAOCULAR | Status: AC
  Filled 2017-05-01: qty 500

## 2017-05-01 MED ORDER — BSS IO SOLN
INTRAOCULAR | Status: AC
  Filled 2017-05-01: qty 15

## 2017-05-01 MED ORDER — LIDOCAINE HCL 2 % IJ SOLN
INTRAMUSCULAR | Status: AC
  Filled 2017-05-01: qty 20

## 2017-05-01 MED ORDER — ACETAMINOPHEN 10 MG/ML IV SOLN
1000.0000 mg | INTRAVENOUS | Status: AC | PRN
  Administered 2017-05-01: 1000 mg via INTRAVENOUS
  Filled 2017-05-01: qty 100

## 2017-05-01 MED ORDER — ATROPINE SULFATE 1 % OP SOLN
1.0000 [drp] | Freq: Once | OPHTHALMIC | Status: DC
  Filled 2017-05-01: qty 2

## 2017-05-01 MED ORDER — OXYCODONE HCL 5 MG/5ML PO SOLN: 5.0000 mg | Freq: Once | ORAL | Status: DC | PRN

## 2017-05-01 MED ORDER — PROPOFOL 10 MG/ML IV BOLUS
INTRAVENOUS | Status: AC
  Filled 2017-05-01: qty 20

## 2017-05-01 MED ORDER — BSS IO SOLN
INTRAOCULAR | Status: DC | PRN
  Administered 2017-05-01: 15 mL via INTRAOCULAR

## 2017-05-01 SURGICAL SUPPLY — 52 items
APPLICATOR COTTON TIP 6IN STRL (MISCELLANEOUS) ×3 IMPLANT
BLADE MVR KNIFE 20G (BLADE) IMPLANT
CANNULA ANT CHAM MAIN (OPHTHALMIC RELATED) IMPLANT
CANNULA DUAL BORE 23G (CANNULA) IMPLANT
CANNULA DUALBORE 25G (CANNULA) IMPLANT
CANNULA VLV SOFT TIP 25GA (OPHTHALMIC) ×3 IMPLANT
CAUTERY EYE LOW TEMP 1300F FIN (OPHTHALMIC RELATED) IMPLANT
CLOSURE STERI-STRIP 1/2X4 (GAUZE/BANDAGES/DRESSINGS) ×1
CLSR STERI-STRIP ANTIMIC 1/2X4 (GAUZE/BANDAGES/DRESSINGS) ×2 IMPLANT
CORDS BIPOLAR (ELECTRODE) IMPLANT
COVER MAYO STAND STRL (DRAPES) ×3 IMPLANT
DRAPE HALF SHEET 40X57 (DRAPES) ×3 IMPLANT
DRAPE INCISE 51X51 W/FILM STRL (DRAPES) IMPLANT
DRAPE RETRACTOR (MISCELLANEOUS) ×3 IMPLANT
ERASER HMR WETFIELD 23G BP (MISCELLANEOUS) IMPLANT
FORCEPS ECKARDT ILM 25G SERR (OPHTHALMIC RELATED) IMPLANT
FORCEPS GRIESHABER ILM 25G A (INSTRUMENTS) IMPLANT
GAS AUTO FILL CONSTEL (OPHTHALMIC) ×3
GAS AUTO FILL CONSTELLATION (OPHTHALMIC) ×1 IMPLANT
GLOVE BIO SURGEON STRL SZ7.5 (GLOVE) ×3 IMPLANT
GOWN STRL REUS W/ TWL LRG LVL3 (GOWN DISPOSABLE) ×3 IMPLANT
GOWN STRL REUS W/TWL LRG LVL3 (GOWN DISPOSABLE) ×6
HANDLE PNEUMATIC FOR CONSTEL (OPHTHALMIC) IMPLANT
KIT BASIN OR (CUSTOM PROCEDURE TRAY) ×3 IMPLANT
KIT ROOM TURNOVER OR (KITS) ×3 IMPLANT
LENS BIOM SUPER VIEW SET DISP (OPHTHALMIC RELATED) ×3 IMPLANT
MICROPICK 25G (MISCELLANEOUS)
NEEDLE 18GX1X1/2 (RX/OR ONLY) (NEEDLE) ×3 IMPLANT
NEEDLE 25GX 5/8IN NON SAFETY (NEEDLE) ×3 IMPLANT
NEEDLE FILTER BLUNT 18X 1/2SAF (NEEDLE) ×2
NEEDLE FILTER BLUNT 18X1 1/2 (NEEDLE) ×1 IMPLANT
NEEDLE HYPO 25GX1X1/2 BEV (NEEDLE) ×3 IMPLANT
NEEDLE HYPO 30X.5 LL (NEEDLE) ×6 IMPLANT
NEEDLE RETROBULBAR 25GX1.5 (NEEDLE) IMPLANT
NS IRRIG 1000ML POUR BTL (IV SOLUTION) ×3 IMPLANT
PACK VITRECTOMY CUSTOM (CUSTOM PROCEDURE TRAY) ×3 IMPLANT
PAD ARMBOARD 7.5X6 YLW CONV (MISCELLANEOUS) ×6 IMPLANT
PAK PIK VITRECTOMY CVS 25GA (OPHTHALMIC) ×3 IMPLANT
PENCIL BIPOLAR 25GA STR DISP (OPHTHALMIC RELATED) IMPLANT
PICK MICROPICK 25G (MISCELLANEOUS) IMPLANT
PROBE LASER ILLUM FLEX CVD 25G (OPHTHALMIC) ×3 IMPLANT
ROLLS DENTAL (MISCELLANEOUS) ×6 IMPLANT
SCRAPER DIAMOND 25GA (OPHTHALMIC RELATED) IMPLANT
STOPCOCK 4 WAY LG BORE MALE ST (IV SETS) IMPLANT
SUT VICRYL 7 0 TG140 8 (SUTURE) ×3 IMPLANT
SYR 10ML LL (SYRINGE) IMPLANT
SYR 20CC LL (SYRINGE) ×3 IMPLANT
SYR 5ML LL (SYRINGE) ×3 IMPLANT
SYR TB 1ML LUER SLIP (SYRINGE) ×3 IMPLANT
TOWEL OR 17X24 6PK STRL BLUE (TOWEL DISPOSABLE) ×6 IMPLANT
WATER STERILE IRR 1000ML POUR (IV SOLUTION) ×3 IMPLANT
WIPE INSTRUMENT VISIWIPE 73X73 (MISCELLANEOUS) IMPLANT

## 2017-05-01 NOTE — Progress Notes (Signed)
Speech Language Pathology Daily Session Note  Patient Details  Name: [name] Barnett F Bobo MRN: [id number] Date of Birth: 09/29/1976  Today's Date: 05/01/2017 SLP Individual Time: 0830-0900 SLP Individual Time Calculation (min): 30 min    Short Term Goals: Week 2: SLP Short Term Goal 1 (Week 2): Patient will consume current diet without overt s/s of aspiration and Mod I for use of chin tuck. SLP Short Term Goal 2 (Week 2): Patient will recall new, daily information with Mod I.  SLP Short Term Goal 3 (Week 2): Patient will demonstrate complex problem solving with Mod I.  SLP Short Term Goal 4 (Week 2): Patient will attend to LUE during functional tasks with Supervision verbal cues.  SLP Short Term Goal 5 (Week 2): Patient will identify activities that she will need assistance with at discharge with Mod I.  Skilled Therapeutic Interventions: Skilled treatment session focused on cognitive goals. Upon arrival, patient was supine in bed and reported a 7/10 headache. RN aware and discussing pain medications with PA due to patient being NPO for procedure. SLP facilitated session by re-creating her schedule both larger and darker so that she could utilize it to anticipate upcoming appointments. However, patient able to recall upcoming therapy times with Mod I after a 10 minute delay. Patient left upright in bed with all needs within reach. Continue with current plan of care.    Function:  Cognition Comprehension Comprehension assist level: Follows basic conversation/direction with extra time/assistive device  Expression   Expression assist level: Expresses basic needs/ideas: With extra time/assistive device  Social Interaction Social Interaction assist level: Interacts appropriately 90% of the time - Needs monitoring or encouragement for participation or interaction.  Problem Solving Problem solving assist level: Solves basic 90% of the time/requires cueing < 10% of the time  Memory Memory assist level:  Recognizes or recalls 90% of the time/requires cueing < 10% of the time    Pain Pain Assessment Pain Assessment: 0-10 Pain Score: 7  Pain Type: Acute pain Pain Location: Head Pain Orientation: Left Pain Descriptors / Indicators: Aching Pain Onset: With Activity Pain Intervention(s): RN aware. Repositioned  Therapy/Group: Individual Therapy  Kace Hartje 05/01/2017, 4:01 PM

## 2017-05-01 NOTE — Progress Notes (Signed)
05/01/17 nursing [facility] RN stayed with patient and claims she feels better.

## 2017-05-01 NOTE — Progress Notes (Signed)
[facility] PHYSICAL MEDICINE & REHABILITATION     PROGRESS NOTE    Subjective/Complaints: No issues overnite, scheduled for surgery  ROS: pt denies nausea, vomiting,+ diarrhea, no cough, shortness of breath or chest pain   Objective: Vital Signs: Blood pressure (!) 102/45, pulse 71, temperature 98.2 F (36.8 C), temperature source Oral, resp. rate 14, height 5\' 4"  (1.626 m), SpO2 100 %. No results found. No results for input(s): WBC, HGB, HCT, PLT in the last 72 hours. No results for input(s): NA, K, CL, GLUCOSE, BUN, CREATININE, CALCIUM in the last 72 hours.  Invalid input(s): CO CBG (last 3)  No results for input(s): GLUCAP in the last 72 hours.  Wt Readings from Last 3 Encounters:  04/07/17 65.8 kg (145 lb 1 oz)  02/08/12 51.7 kg (114 lb)  01/21/12 52.4 kg (115 lb 8.3 oz)    Physical Exam:  Constitutional: She appears well-developed. No distress.  HENT:  Mouth/Throat: Oropharynx is clear and moist.  Eyes:  Left peri-orbital ecchymosis, lid swelling, Unable to read large print   Neck: No tracheal deviationpresent. No thyromegalypresent.  Cardiovascular: Normal rate. Exam reveals no gallopand no friction rub.  No murmurheard. Respiratory: No respiratory distress. She has no wheezes. She has no rales.  GI: Soft. She exhibits no distension. There is no tenderness.  Neurological:  Right facial weakness is persistent. Speech clear. OSstruggles with horizontal tracking as well as superiorly.OD nystagmus Ataxia right upper and lower extremity. Right pronator drift. Strength 4/5 RUE prox to distal and RLE prox to distal. 4+ LUE and LLE. Fair insight and awareness. Follows commands.  Psychiatric: She has a normal mood and affect. Her behavior is normal.   Skin: wounds clean on scalp  Assessment/Plan: 1. Functional, mobility deficits  secondary to [facility] which require 3+ hours per day of interdisciplinary therapy in a comprehensive inpatient rehab setting. Physiatrist  is providing close team supervision and 24 hour management of active medical problems listed below. Physiatrist and rehab team continue to assess barriers to discharge/monitor patient progress toward functional and medical goals.  Function:  Bathing Bathing position   Position: Shower  Bathing parts Body parts bathed by patient: Right arm, Left arm, Chest, Abdomen, Front perineal area, Buttocks, Left upper leg, Right upper leg Body parts bathed by helper: Right lower leg, Left lower leg  Bathing assist Assist Level: Touching or steadying assistance(Pt > 75%)      Upper Body Dressing/Undressing Upper body dressing   What is the patient wearing?: Pull over shirt/dress     Pull over shirt/dress - Perfomed by patient: Thread/unthread left sleeve, Thread/unthread right sleeve, Put head through opening Pull over shirt/dress - Perfomed by helper: Pull shirt over trunk        Upper body assist Assist Level: Supervision or verbal cues      Lower Body Dressing/Undressing Lower body dressing   What is the patient wearing?: Pants, Underwear, Non-skid slipper socks Underwear - Performed by patient: Thread/unthread right underwear leg, Thread/unthread left underwear leg Underwear - Performed by helper: Pull underwear up/down Pants- Performed by patient: Thread/unthread left pants leg, Thread/unthread right pants leg Pants- Performed by helper: Pull pants up/down Non-skid slipper socks- Performed by patient: Don/doff right sock, Don/doff left sock Non-skid slipper socks- Performed by helper: Don/doff left sock, Don/doff right sock                  Lower body assist Assist for lower body dressing: Touching or steadying assistance (Pt > 75%)  Toileting Toileting Toileting activity did not occur: No continent bowel/bladder event Toileting steps completed by patient: Adjust clothing prior to toileting, Performs perineal hygiene, Adjust clothing after toileting Toileting steps  completed by helper: Adjust clothing after toileting Toileting Assistive Devices: Grab bar or rail  Toileting assist Assist level: Supervision or verbal cues   Transfers Chair/bed transfer   Chair/bed transfer method: Stand pivot Chair/bed transfer assist level: Touching or steadying assistance (Pt > 75%) Chair/bed transfer assistive device: Armrests, Patent attorneyWalker     Locomotion Ambulation     Max distance: 50' Assist level: Touching or steadying assistance (Pt > 75%)   Wheelchair   Type: Manual Max wheelchair distance: 25 Assist Level: Supervision or verbal cues  Cognition Comprehension Comprehension assist level: Follows basic conversation/direction with extra time/assistive device  Expression Expression assist level: Expresses basic needs/ideas: With extra time/assistive device  Social Interaction Social Interaction assist level: Interacts appropriately 90% of the time - Needs monitoring or encouragement for participation or interaction.  Problem Solving Problem solving assist level: Solves basic 90% of the time/requires cueing < 10% of the time  Memory Memory assist level: Recognizes or recalls 90% of the time/requires cueing < 10% of the time   Medical Problem List and Plan: 1. 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 -continue CIR therapies PT, OT, speech,Team conference today   2. DVT Prophylaxis/Anticoagulation: Mechanical: Sequential compression devices, below knee Bilateral lower extremities 3. Headaches/Pain Management:   -add prn tramadol  -topamax 25mg  bid , Increased to 50 mg , no headaches reported today 4. Mood: LCSW to follow for evaluation and support.  5. Neuropsych: This patient iscapable of making decisions on herown behalf. 6. Skin/Wound Care: Routine pressure relief measures.  7. Fluids/Electrolytes/Nutrition: Monitor I/O. Encourage PO  --potassium wnl  8. HTN: nimotop taper for  vasospasms  -prn clonidine  -Mainly elevated in a.m., trial doxazosin daily at bedtime, am BPs lower monitor for orthostasis Vitals:   04/30/17 1334 05/01/17 0430  BP: 128/64 (!) 102/45  Pulse: 94 71  Resp: 16 14  Temp: 98.6 F (37 C) 98.2 F (36.8 C)   9. 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 ABLA: Hgb 11.0--> 10.8 On 5/18 12. Leukocytosis: WBC's down to 14.5k 5/18  -afebrile, lungs remain clear, wounds clean  -UTI ecoli completed amoxil,  -steroid component to elevation as well.     .  13. Constipation: Cleaned out, avoid anti diarrheals to avoid further constipation,Change miralax to prn LOS (Days) 12 A FACE TO FACE EVALUATION WAS PERFORMED  Erick ColaceKIRSTEINS,ANDREW E, MD 05/01/2017 10:14 AM

## 2017-05-01 NOTE — Progress Notes (Signed)
Occupational Therapy Session Note  Patient Details  Name: [name] Barnett MRN: [id number] Date of Birth: Apr 22, 1976  Today's Date: 05/01/2017 OT Individual Time: 1001-1100 OT Individual Time Calculation (min): 59 min   Short Term Goals: Week 2:  OT Short Term Goal 1 (Week 2): STG=LTG 2/2 estimated LOS  Skilled Therapeutic Interventions/Progress Updates:    OT treatment session focused on visual scanning, hand-eye coordination, and L NMR. Dynavision in standing focused on scanning in all 4 quadrants and reaching across midline to locate red lights. Pt slower to upper quadrant stating looking up is painful to L eye. Dynamic standing balance while reaching for lights with manual facilitation for anterior weight shift and Min A overall for balance. Addressed functional use of L UE using thera-putty focused on in-hand manipulation, weight bearing, pinch strength, palmer arch and rotation. Pt returned to room at end of session and left with needs met.   Therapy Documentation Precautions:  Precautions Precautions: Fall Precaution Comments: Impulsive Restrictions Weight Bearing Restrictions: No Pain: Pain Assessment Pain Assessment: 0-10 Pain Score: 3  Pain Type: Acute pain Pain Location: Head Pain Orientation: Left Pain Descriptors / Indicators: Aching Pain Onset: With Activity Pain Intervention(s): Repositioned ADL: ADL ADL Comments: Please see functional navigator  See Function Navigator for Current Functional Status.   Therapy/Group: Individual Therapy  Valma Cava 05/01/2017, 12:46 PM

## 2017-05-01 NOTE — Progress Notes (Signed)
Physical Therapy Note  Patient Details  Name: [name] Barnett MRN: [id number] Date of Birth: 12/10/1976 Today's Date: 05/01/2017    Time: 1100-1130 30 minutes  1:1 Pt c/o headache, denies need for pain meds at this time.  Gait training 2 x 50' with min A with RW, 2 x LOB requiring max/total A to correct.  Standing balance without UE support with mini squats and heel raises each 2 x 10 with min A.   Daltin Crist 05/01/2017, 11:32 AM

## 2017-05-01 NOTE — Brief Op Note (Signed)
04/19/2017 - 05/01/2017  5:47 PM  PATIENT:  [name] Barnett  41 y.o. female  PRE-OPERATIVE DIAGNOSIS:  Vitreous hemorrage, right eye  POST-OPERATIVE DIAGNOSIS:  Vitreous hemorrage, right eye with retinal hemorrhage   PROCEDURE:  Procedure(s): PARS PLANA VITRECTOMY WITH 25 GAUGE RIGHT EYE, endolaser photocoaglation (Right)  SURGEON:  Surgeon(s) and Role:    * Jalene Mullet, MD - Primary  PHYSICIAN ASSISTANT:   ASSISTANTS: none   ANESTHESIA:   local and MAC  EBL:  Total I/O In: 500 [I.V.:500] Out: -   BLOOD ADMINISTERED:none  DRAINS: none   LOCAL MEDICATIONS USED:  MARCAINE    and LIDOCAINE   SPECIMEN:  No Specimen  DISPOSITION OF SPECIMEN:  N/A  COUNTS:  YES  TOURNIQUET:  * No tourniquets in log *  DICTATION: .Note written in EPIC  PLAN OF CARE: Return to inpatient rehabilitation  PATIENT DISPOSITION:  PACU - hemodynamically stable.   Delay start of Pharmacological VTE agent (>24hrs) due to surgical blood loss or risk of bleeding: not applicable

## 2017-05-01 NOTE — Anesthesia Preprocedure Evaluation (Addendum)
Anesthesia Evaluation  Patient identified by MRN, date of birth, ID band Patient awake    Reviewed: Allergy & Precautions, NPO status , Patient's Chart, lab work & pertinent test results  Airway Mallampati: III  TM Distance: >3 FB Neck ROM: Full    Dental no notable dental hx.    Pulmonary Current Smoker,    Pulmonary exam normal breath sounds clear to auscultation       Cardiovascular hypertension, Normal cardiovascular exam Rhythm:Regular Rate:Normal  ECG: SR, rate 82   Neuro/Psych Subarachnoid hemorrhage  CVA, Residual Symptoms negative psych ROS   GI/Hepatic Neg liver ROS, GERD  ,  Endo/Other  negative endocrine ROS  Renal/GU negative Renal ROS  negative genitourinary   Musculoskeletal negative musculoskeletal ROS (+)   Abdominal   Peds negative pediatric ROS (+)  Hematology negative hematology ROS (+)   Anesthesia Other Findings   Reproductive/Obstetrics negative OB ROS                            Anesthesia Physical Anesthesia Plan  ASA: III  Anesthesia Plan: MAC   Post-op Pain Management:    Induction: Intravenous  Airway Management Planned: Simple Face Mask  Additional Equipment:   Intra-op Plan:   Post-operative Plan:   Informed Consent: I have reviewed the patients History and Physical, chart, labs and discussed the procedure including the risks, benefits and alternatives for the proposed anesthesia with the patient or authorized representative who has indicated his/her understanding and acceptance.   Dental advisory given  Plan Discussed with: CRNA and Surgeon  Anesthesia Plan Comments:        Anesthesia Quick Evaluation

## 2017-05-01 NOTE — Consult Note (Signed)
Neuropsychological Consultation  Patient:  [name] Barnett   DOB: 07/02/1976  MR Number: [id number]  [facility] [address] [id number] [place] [id number] Dept: [phone] Loc: [phone]  Start: 9 AM End: 10 AM  Provider/Observer:        Chief Complaint:     Increasing stress primarily focused on her inability to see. She has a lot of positive anticipation for surgical interventions this afternoon. However, she reports that not only is she very stressed but she is very worried about not letting her youngest daughter know all of the details about what happened with her.  Reason For Service:     [name] Barnett is a 41 year old female that was admitted on April 07, 2017 with increasingly worsening headache, with loss of consciousness and fall. Radiological studies revealed diffuse subarachnoid hemorrhage. After neurosurgical interventions, the patient did develop some hypoxia with increased lethargy what appeared to be due to a L-SCA stroke. Below you will find the history of present illness from the current admission.  Interventions Strategy:  Coping skills around adjustment disorder that is developed due to her subarachnoid hemorrhage.  Participation Level:   Active  Participation Quality:  Appropriate and Attentive      Behavioral Observation:  Well Groomed, Alert, and Appropriate.   Current Psychosocial Factors: The patient reports that she has been increasingly distressed by the lack of her ability to see. She reports that she has gotten much more hope after talking to the ophthalmological surgeon. However, she reports that she has had times where she has felt like crying and very depressed but she has not had crying spells, which does appear to confuse her to some degree. She worries about how to cope with what is happening with her younger daughter.  Content of Session:   Reviewed current  symptoms and work on therapeutic interventions around building coping skills and strategies.  Current Status:   The patient reports that she has had increasing anxiety and depression over the past couple of days as the lack of improvement in her vision has been causing her increasing stress and concern.  Patient Progress:   The patient reports that she has had some worsening of anxiety and depression recently. Worked on Producer, television/film/videobuilding coping skills and strategies around this and she does report that she is very hopeful about the effectiveness of the surgical intervention that is planned for this afternoon.  Impression/Diagnosis:    [name] Barnett is a 41 year old female that was admitted on April 07, 2017 with increasingly worsening headache, with loss of consciousness and fall. Radiological studies revealed diffuse subarachnoid hemorrhage. After neurosurgical interventions, the patient did develop some hypoxia with increased lethargy what appeared to be due to a L-SCA stroke. Below you will find the history of present illness from the current admission.  The patient was referred for neuropsychological/psychological consultation due to adjustment issues primarily related to stress following subarachnoid hemorrhage affecting her vision. The patient described feeling as if she was "crazy" and not herself. The patient reports that it is very stressful not being able to see her surroundings and cope with this vision changes. She reports that she is having headaches frequently and feels very dizzy. She also describes numbness on her face which constantly distracts her.  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

## 2017-05-01 NOTE — Op Note (Signed)
[name] Barnett 05/01/2017 Diagnosis: Vitreous hemorrhage with retinal hemorrhage  Procedure: Pars Plana Vitrectomy and Endolaser Operative Eye:  right eye  Surgeon: Harrold DonathNarendra Mafabhai Alsace Dowd Estimated Blood Loss: minimal Specimens for Pathology:  None Complications: none   The  patient was prepped and draped in the usual fashion for ocular surgery on the  right eye .  A lid speculum was placed.  Infusion line and trocar was placed at the 8 o'clock position approximately 3.5 mm from the surgical limbus.   The infusion line was allowed to run and then clamped when placed at the cannula opening. The line was inserted and secured to the drape with an adhesive strip.   Active trocars/cannula were placed at the 10 and 2 o'clock positions approximately 3.5 mm from the surgical limbus. The cannula was visualized in the vitreous cavity.  The light pipe and vitreous cutter were inserted into the vitreous cavity and a core vitrectomy was performed.  There was no view of the posterior pole due to the dense vitreous hemorrhage.  A posterior vitreous detachment was induced.  The vitreous was adherant to the peripheral retina.  In the superotemporal quadrant, retinal traction was noted.  Care taken to remove the vitreous up to the vitreous base for 360 degrees.   3 rows of endolaser were applied 360 degrees to the periphery to the ora serrata and surrounding any breaks.  A partial air-fluid exchange was performed.  The superior cannulas were sequentially removed with concommitant tamponade using a cotton tipped applicator and noted to be air tight.  The infusion line and trocar were removed and the sclerotomy was noted to be air tight with normal intraocular pressure by digital palpapation.  Subconjunctival injections of  Ancef and Decadron were placed in the infero-medial quadrant.   The speculum and drapes were removed and the eye was patched with Polymixin/Bacitracin ophthalmic ointment. An eye shield was  placed and the patient was transferred alert and conversant with stable vital signs to the post operative recovery area.  The patient tolerated the procedure well and no complications were noted.  Harrold DonathNarendra Mafabhai Carmilla Granville MD

## 2017-05-01 NOTE — Transfer of Care (Signed)
Immediate Anesthesia Transfer of Care Note  Patient: [name] Barnett  Procedure(s) Performed: Procedure(s): PARS PLANA VITRECTOMY WITH 25 GAUGE RIGHT EYE, endolaser photocoaglation (Right)  Patient Location: PACU  Anesthesia Type:MAC  Level of Consciousness: awake, alert  and oriented  Airway & Oxygen Therapy: Patient Spontanous Breathing and Patient connected to nasal cannula oxygen  Post-op Assessment: Report given to RN, Post -op Vital signs reviewed and stable and Patient moving all extremities  Post vital signs: Reviewed and stable  Last Vitals:  Vitals:   05/01/17 0430 05/01/17 1508  BP: (!) 102/45 115/70  Pulse: 71 71  Resp: 14 16  Temp: 36.8 C 37.1 C    Last Pain:  Vitals:   05/01/17 1508  TempSrc: Oral  PainSc:       Patients Stated Pain Goal: 2 (06/00/45 9977)  Complications: No apparent anesthesia complications

## 2017-05-01 NOTE — Progress Notes (Signed)
Physical Therapy Session Note  Patient Details  Name: [name] Barnett MRN: [id number] Date of Birth: 05/13/1976  Today's Date: 05/01/2017 PT Individual Time: 1300-1353 PT Individual Time Calculation (min): 53 min   Short Term Goals: Week 2:  PT Short Term Goal 1 (Week 2): =LTGs due to ELOS  Skilled Therapeutic Interventions/Progress Updates:    no c/o pain, but reports ongoing difficulty with dizziness and low vision.  Session focus on activity tolerance and NMR for ambulation. Pt expressing frustration throughout session regarding low vision and hopes for some resolution with planned surgery.  PT provided emotional support throughout session as needed.    Pt able to perform stand/pivot transfers to R and L throughout session with close supervision, occasional tactile cues for safety due to low vision.  Nustep 2x5 minutes at level 5 for reciprocal stepping pattern and trunk rotation.  Min verbal cues for maintaining LUE contact on handle throughout.    Gait x50' with bilateral HHA initially and range min/mod assist with verbal cues for step length and upright posture.  Gait with shopping cart x150' with min assist for steering shopping cart and steady assist for balance.  5' of distance with shopping cart pt required up to mod assist 2/2 shuffling gait with forward flexed posture but able to correct with cues and rest break.  Pt returned to room at end of session and positioned upright in w/c to await SLP session.   Therapy Documentation Precautions:  Precautions Precautions: Fall Precaution Comments: Impulsive Restrictions Weight Bearing Restrictions: No   See Function Navigator for Current Functional Status.   Therapy/Group: Individual Therapy  Ladora DanielCaitlin E Penven-Crew 05/01/2017, 1:55 PM

## 2017-05-01 NOTE — Anesthesia Postprocedure Evaluation (Signed)
Anesthesia Post Note  Patient: Museum/gallery conservator F Klindt  Procedure(s) Performed: Procedure(s) (LRB): PARS PLANA VITRECTOMY WITH 25 GAUGE RIGHT EYE, endolaser photocoaglation (Right)  Patient location during evaluation: PACU Anesthesia Type: MAC Level of consciousness: awake and alert Pain management: pain level controlled Vital Signs Assessment: post-procedure vital signs reviewed and stable Respiratory status: spontaneous breathing, nonlabored ventilation and respiratory function stable Cardiovascular status: stable and blood pressure returned to baseline Anesthetic complications: no       Last Vitals:  Vitals:   05/01/17 1745 05/01/17 1800  BP: (!) 130/91 (!) 123/52  Pulse: 81 69  Resp: 15 12  Temp: 36.8 C     Last Pain:  Vitals:   05/01/17 1508  TempSrc: Oral  PainSc:                  Meygan Kyser,W. EDMOND

## 2017-05-01 NOTE — Progress Notes (Signed)
SLP Cancellation Note  Patient Details Name: [name] Barnett MRN: [id number] DOB: 03/03/1976   Cancelled treatment:       Patient missed 30 minutes of skilled SLP intervention due to nursing staff preparing patient for procedure.                                                                                                Jalesia Loudenslager 05/01/2017, 4:05 PM

## 2017-05-02 ENCOUNTER — Inpatient Hospital Stay (HOSPITAL_COMMUNITY): Payer: BLUE CROSS/BLUE SHIELD | Admitting: Physical Therapy

## 2017-05-02 ENCOUNTER — Inpatient Hospital Stay (HOSPITAL_COMMUNITY): Payer: BLUE CROSS/BLUE SHIELD | Admitting: Occupational Therapy

## 2017-05-02 ENCOUNTER — Encounter (HOSPITAL_COMMUNITY): Payer: Self-pay | Admitting: Ophthalmology

## 2017-05-02 ENCOUNTER — Inpatient Hospital Stay (HOSPITAL_COMMUNITY): Payer: BLUE CROSS/BLUE SHIELD | Admitting: Speech Pathology

## 2017-05-02 DIAGNOSIS — F4323 Adjustment disorder with mixed anxiety and depressed mood: Secondary | ICD-10-CM

## 2017-05-02 NOTE — Progress Notes (Addendum)
[place] PHYSICAL MEDICINE & REHABILITATION     PROGRESS NOTE    Subjective/Complaints: Appreciate optho note, now can see color in R eye  ROS: pt denies nausea, vomiting,+ diarrhea, no cough, shortness of breath or chest pain   Objective: Vital Signs: Blood pressure 117/65, pulse 87, temperature 98 F (36.7 C), temperature source Oral, resp. rate 18, height 5\' 4"  (1.626 m), SpO2 100 %. No results found. No results for input(s): WBC, HGB, HCT, PLT in the last 72 hours. No results for input(s): NA, K, CL, GLUCOSE, BUN, CREATININE, CALCIUM in the last 72 hours.  Invalid input(s): CO CBG (last 3)  No results for input(s): GLUCAP in the last 72 hours.  Wt Readings from Last 3 Encounters:  04/07/17 65.8 kg (145 lb 1 oz)  02/08/12 51.7 kg (114 lb)  01/21/12 52.4 kg (115 lb 8.3 oz)    Physical Exam:  Constitutional: She appears well-developed. No distress.  HENT:  Mouth/Throat: Oropharynx is clear and moist.  Eyes:  Left peri-orbital ecchymosis, lid swelling, Unable to read large print   Neck: No tracheal deviationpresent. No thyromegalypresent.  Cardiovascular: Normal rate. Exam reveals no gallopand no friction rub.  No murmurheard. Respiratory: No respiratory distress. She has no wheezes. She has no rales.  GI: Soft. She exhibits no distension. There is no tenderness.  Neurological:  Right facial weakness is persistent. Speech clear. OSstruggles with horizontal tracking as well as superiorly.OD nystagmus Ataxia right upper and lower extremity. Right pronator drift. Strength 4/5 RUE prox to distal and RLE prox to distal. 4+ LUE and LLE. Fair insight and awareness. Follows commands.  Psychiatric: She has a normal mood and affect. Her behavior is normal.   Skin: wounds clean on scalp  Assessment/Plan: 1. Functional, mobility deficits  secondary to [facility] which require 3+ hours per day of interdisciplinary therapy in a comprehensive inpatient rehab  setting. Physiatrist is providing close team supervision and 24 hour management of active medical problems listed below. Physiatrist and rehab team continue to assess barriers to discharge/monitor patient progress toward functional and medical goals.  Function:  Bathing Bathing position   Position: Shower  Bathing parts Body parts bathed by patient: Right arm, Left arm, Chest, Abdomen, Front perineal area, Buttocks, Left upper leg, Right upper leg Body parts bathed by helper: Right lower leg, Left lower leg  Bathing assist Assist Level: Touching or steadying assistance(Pt > 75%)      Upper Body Dressing/Undressing Upper body dressing   What is the patient wearing?: Pull over shirt/dress     Pull over shirt/dress - Perfomed by patient: Thread/unthread left sleeve, Thread/unthread right sleeve, Put head through opening Pull over shirt/dress - Perfomed by helper: Pull shirt over trunk        Upper body assist Assist Level: Supervision or verbal cues      Lower Body Dressing/Undressing Lower body dressing   What is the patient wearing?: Pants, Underwear, Non-skid slipper socks Underwear - Performed by patient: Thread/unthread right underwear leg, Thread/unthread left underwear leg Underwear - Performed by helper: Pull underwear up/down Pants- Performed by patient: Thread/unthread left pants leg, Thread/unthread right pants leg Pants- Performed by helper: Pull pants up/down Non-skid slipper socks- Performed by patient: Don/doff right sock, Don/doff left sock Non-skid slipper socks- Performed by helper: Don/doff left sock, Don/doff right sock                  Lower body assist Assist for lower body dressing: Touching or steadying assistance (Pt > 75%)  Toileting Toileting Toileting activity did not occur: No continent bowel/bladder event Toileting steps completed by patient: Adjust clothing prior to toileting, Performs perineal hygiene, Adjust clothing after  toileting Toileting steps completed by helper: Adjust clothing after toileting Toileting Assistive Devices: Grab bar or rail  Toileting assist Assist level: Touching or steadying assistance (Pt.75%)   Transfers Chair/bed transfer   Chair/bed transfer method: Stand pivot Chair/bed transfer assist level: Touching or steadying assistance (Pt > 75%) Chair/bed transfer assistive device: Armrests, Patent attorneyWalker     Locomotion Ambulation     Max distance: 0 Assist level: Moderate assist (Pt 50 - 74%)   Wheelchair   Type: Manual Max wheelchair distance: 25 Assist Level: Supervision or verbal cues  Cognition Comprehension Comprehension assist level: Follows basic conversation/direction with extra time/assistive device  Expression Expression assist level: Expresses basic needs/ideas: With extra time/assistive device  Social Interaction Social Interaction assist level: Interacts appropriately 90% of the time - Needs monitoring or encouragement for participation or interaction.  Problem Solving Problem solving assist level: Solves basic 90% of the time/requires cueing < 10% of the time  Memory Memory assist level: Recognizes or recalls 90% of the time/requires cueing < 10% of the time   Medical Problem List and Plan: 1. 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 -continue CIR therapies PT, OT, speech,may resume therapy post vitrectomy  2. DVT Prophylaxis/Anticoagulation: Mechanical: Sequential compression devices, below knee Bilateral lower extremities 3. Headaches/Pain Management:   -add prn tramadol  -topamax 25mg  bid , Increased to 50 mg , no headaches reported today 4. Mood: LCSW to follow for evaluation and support.  5. Neuropsych: This patient iscapable of making decisions on herown behalf. 6. Skin/Wound Care: Routine pressure relief measures.  7. Fluids/Electrolytes/Nutrition: Monitor I/O. Encourage PO  --potassium  wnl  8. HTN: nimotop taper for vasospasms  -prn clonidine  -Mainly elevated in a.m., trial doxazosin daily at bedtime, am BPs lower monitor for orthostasis Vitals:   05/01/17 1815 05/02/17 0434  BP:  117/65  Pulse: 73 87  Resp: 14 18  Temp:  98 F (36.7 C)   9. 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 ABLA: Hgb 11.0--> 10.8 On 5/18 12. Leukocytosis: WBC's down to 14.5k 5/18  -afebrile, lungs remain clear, wounds clean  -UTI ecoli completed amoxil,  -steroid component to elevation as well.     .  13. Constipation: Cleaned out, avoid anti diarrheals to avoid further constipation,Change miralax to prn LOS (Days) 13 A FACE TO FACE EVALUATION WAS PERFORMED  Erick ColaceKIRSTEINS,Darek Eifler E, MD 05/02/2017 9:05 AM

## 2017-05-02 NOTE — Progress Notes (Signed)
Physical Therapy Session Note  Patient Details  Name: [name] Barnett MRN: [id number] Date of Birth: Jan 21, 1976  Today's Date: 05/02/2017 PT Individual Time: 0900-1000 PT Individual Time Calculation (min): 60 min   Short Term Goals: Week 2:  PT Short Term Goal 1 (Week 2): =LTGs due to ELOS  Skilled Therapeutic Interventions/Progress Updates:    no c/o pain, pt reports that her vision is blurry but much better and that she's able to see bright colors and shapes today.  Session focus on visual scanning and alternating attention with self feeding task and dressing edge of bed.  Pt requires supervision cues for locating items on tray.  Supervision for sitting balance EOB during dressing and min guard for standing to pull pants over hips.  Pt positioned in w/c with steady assist for stand pivot at end of session.  Call bell in reach and needs met.   Therapy Documentation Precautions:  Precautions Precautions: Fall Precaution Comments: Impulsive Restrictions Weight Bearing Restrictions: No   See Function Navigator for Current Functional Status.   Therapy/Group: Individual Therapy  [name] E Penven-Crew 05/02/2017, 10:04 AM

## 2017-05-02 NOTE — Progress Notes (Signed)
Occupational Therapy Session Note  Patient Details  Name: KAWTHAR ENNEN MRN: [id number] Date of Birth: 1976-06-12  Today's Date: 05/02/2017  Session 1 OT Individual Time: 1132-1203 OT Individual Time Calculation (min): 31 min   Session 2 OT Individual Time: 1406-1500 OT Individual Time Calculation (min): 54 min    Short Term Goals: Week 2:  OT Short Term Goal 1 (Week 2): STG=LTG 2/2 estimated LOS  Skilled Therapeutic Interventions/Progress Updates:  Session 1   OT treatment session focused on visual scanning, standing balance, and L NMR. Pt reports improved vision and was able to read digital clock in room and locate signs/colors today. Dynamic standing activity focused on balance strategies and visuospatial relationships with locating letters of different sizes and colors. Pt returned to room and left seated in wc with needs met.   Session 2 OT treatment session focused on L NMR, functional ambulation, and standing balance. Pt ambulated 75 feet in hallway w/ RW and Mod A. Pt w/ difficulty motor planning and coordinating steps in maximally distracting environment. 2 rest breaks. Min A with intermittent mod/max A to maintain standing balance 2/2 postural ataxia, faciltated hip balance strategies and hip relationship. Forced use of L UE using CIMT techniques manual facilitation for normal movement patterns. Addressed in-hand manipulation of cards focus on shift, translation, and thumb opposition.   Therapy Documentation Precautions:  Precautions Precautions: Fall Precaution Comments: Impulsive Restrictions Weight Bearing Restrictions: No Pain:   no pain ADL: ADL ADL Comments: Please see functional navigator  See Function Navigator for Current Functional Status.   Therapy/Group: Individual Therapy  Valma Cava 05/02/2017, 4:55 PM

## 2017-05-02 NOTE — Progress Notes (Signed)
Speech Language Pathology Daily Session Note  Patient Details  Name: [name] Barnett Class MRN: [id number] Date of Birth: 10/06/1976  Today's Date: 05/02/2017 SLP Individual Time: 1000-1100 SLP Individual Time Calculation (min): 60 min  Short Term Goals: Week 2: SLP Short Term Goal 1 (Week 2): Patient will consume current diet without overt s/s of aspiration and Mod I for use of chin tuck. SLP Short Term Goal 2 (Week 2): Patient will recall new, daily information with Mod I.  SLP Short Term Goal 3 (Week 2): Patient will demonstrate complex problem solving with Mod I.  SLP Short Term Goal 4 (Week 2): Patient will attend to LUE during functional tasks with Supervision verbal cues.  SLP Short Term Goal 5 (Week 2): Patient will identify activities that she will need assistance with at discharge with Mod I.  Skilled Therapeutic Interventions: Skilled treatment session focused on cognition goals. SLP facilitated session by providing mental retention tasks including retention for word placement, category exclusion and word placement. Pt required overall supervision cues. SLP further facilitated session by providing mental manipulation tasks with 4 words which required Min A verbal cues. Pt was returned to room, left upright in wheelchair and all needs within reach.      Function:  Eating Eating   Modified Consistency Diet: No Eating Assist Level: Set up assist for;Supervision or verbal cues   Eating Set Up Assist For: Opening containers       Cognition Comprehension Comprehension assist level: Understands complex 90% of the time/cues 10% of the time  Expression   Expression assist level: Expresses complex 90% of the time/cues < 10% of the time  Social Interaction Social Interaction assist level: Interacts appropriately 90% of the time - Needs monitoring or encouragement for participation or interaction.  Problem Solving Problem solving assist level: Solves basic problems with no assist  Memory  Memory assist level: More than reasonable amount of time    Pain Pain Assessment Pain Assessment: No/denies pain  Therapy/Group: Individual Therapy  [name] 05/02/2017, 12:32 PM

## 2017-05-03 ENCOUNTER — Inpatient Hospital Stay (HOSPITAL_COMMUNITY): Payer: BLUE CROSS/BLUE SHIELD | Admitting: Physical Therapy

## 2017-05-03 ENCOUNTER — Inpatient Hospital Stay (HOSPITAL_COMMUNITY): Payer: BLUE CROSS/BLUE SHIELD

## 2017-05-03 ENCOUNTER — Inpatient Hospital Stay (HOSPITAL_COMMUNITY): Payer: BLUE CROSS/BLUE SHIELD | Admitting: Occupational Therapy

## 2017-05-03 ENCOUNTER — Inpatient Hospital Stay (HOSPITAL_COMMUNITY): Payer: BLUE CROSS/BLUE SHIELD | Admitting: Speech Pathology

## 2017-05-03 LAB — CBC WITH DIFFERENTIAL/PLATELET
BASOS ABS: 0 10*3/uL (ref 0.0–0.1)
Basophils Relative: 0 %
Eosinophils Absolute: 0.2 10*3/uL (ref 0.0–0.7)
Eosinophils Relative: 2 %
HEMATOCRIT: 30.3 % — AB (ref 36.0–46.0)
Hemoglobin: 9 g/dL — ABNORMAL LOW (ref 12.0–15.0)
LYMPHS ABS: 2.6 10*3/uL (ref 0.7–4.0)
LYMPHS PCT: 24 %
MCH: 24.9 pg — AB (ref 26.0–34.0)
MCHC: 29.7 g/dL — ABNORMAL LOW (ref 30.0–36.0)
MCV: 83.9 fL (ref 78.0–100.0)
MONO ABS: 0.6 10*3/uL (ref 0.1–1.0)
Monocytes Relative: 6 %
NEUTROS ABS: 7.1 10*3/uL (ref 1.7–7.7)
Neutrophils Relative %: 68 %
Platelets: 290 10*3/uL (ref 150–400)
RBC: 3.61 MIL/uL — AB (ref 3.87–5.11)
RDW: 17.3 % — ABNORMAL HIGH (ref 11.5–15.5)
WBC: 10.5 10*3/uL (ref 4.0–10.5)

## 2017-05-03 LAB — BASIC METABOLIC PANEL
ANION GAP: 9 (ref 5–15)
BUN: 16 mg/dL (ref 6–20)
CHLORIDE: 109 mmol/L (ref 101–111)
CO2: 21 mmol/L — ABNORMAL LOW (ref 22–32)
Calcium: 8.5 mg/dL — ABNORMAL LOW (ref 8.9–10.3)
Creatinine, Ser: 0.63 mg/dL (ref 0.44–1.00)
GFR calc Af Amer: >60 mL/min (ref >60)
GFR calc non Af Amer: >60 mL/min (ref >60)
GLUCOSE: 93 mg/dL (ref 65–99)
POTASSIUM: 3.9 mmol/L (ref 3.5–5.1)
Sodium: 139 mmol/L (ref 135–145)

## 2017-05-03 NOTE — Progress Notes (Signed)
Occupational Therapy Session Note  Patient Details  Name: [name] Barnett MRN: [id number] Date of Birth: [birth year]  Today's Date: 05/03/2017 OT Individual Time: 8335-8251 OT Individual Time Calculation (min): 73 min    Short Term Goals: Week 2:  OT Short Term Goal 1 (Week 2): STG=LTG 2/2 estimated LOS  Skilled Therapeutic Interventions/Progress Updates:   OT treatment session focused on functional ambulation, postural control, L body awareness, spatial awareness, functional use of LUE, and balance strategies within BADL tasks. Pt donned socks at EOB with overall supervision and intermittent min guard A to correct lateral LOB to L when in figure 4 position. Pt required VC for hand placement on RW for sit<>stand w/ min guard. Ambulated to bathroom with min A and max instructional cues for RW position, body proximity, and cues to avoid objects on L side. Pt transferred onto toilet w/ successful bowel and bladder, set-up for peri-care. Pt then ambulated to shower w/ Min A and RW with instructional cues for RW proximity and proximity of shower chair. Pt required cues to incorporate L UE into bathing tasks 75% of the time. Decreased cues required for dressing tasks (~50%) after repetition. Incorporated forced use of L UE within all BADL tasks with manual facilitation to promote normal movement patterns. Addressed B UE coordination,  L FMC, balance, and visual scanning in standing grooming task of washing hands and brushing teeth. Decreased smoothness of L UE movements, but pt able to squeeze toothpaste onto toothbrush and locate 3/3 grooming items w/o cues 2/ 2 improved vision today. Pt able to maintain standing balance with supervision using sink support at hips. Pt left seated in wc at end of session with needs met and call bell in reach.   Therapy Documentation Precautions:  Precautions Precautions: Fall Precaution Comments: Impulsive Restrictions Weight Bearing Restrictions: No Pain: Pain  Assessment Pain Assessment: 0-10 Pain Score: 3  Pain Type: Acute pain Pain Location: Head Pain Orientation: Left Pain Descriptors / Indicators: Headache Pain Intervention(s): Repositioned ADL: ADL ADL Comments: Please see functional navigator See Function Navigator for Current Functional Status.   Therapy/Group: Individual Therapy  Valma Cava 05/03/2017, 9:43 AM

## 2017-05-03 NOTE — Progress Notes (Signed)
Physical Therapy Session Note  Patient Details  Name: [name] Barnett MRN: [id number] Date of Birth: [birth year]  Today's Date: 05/03/2017 PT Individual Time: 1415-1515 PT Individual Time Calculation (min): 60 min   Short Term Goals: Week 2:  PT Short Term Goal 1 (Week 2): =LTGs due to ELOS  Skilled Therapeutic Interventions/Progress Updates:    no c/o pain.  Session focus on pt education for self advocacy and NMR via gait and quadruped during UE task.    Pt performs sit<>stand and stand/pivot transfers throughout session with supervision.  Gait throughout unit 3 trials, max distance 186', with RW and min assist.  Pt with occasional LOB to L with somewhat effective stepping strategy to correct, requiring only up to min assist from therapist.  Pt demos improved awareness of "sloppy" gait and able to stop and correct prior to continuing with ambulation.  NMR in quadruped focus on alternating forced use/weight bearing of BUEs during clothespin task.  Pt discouraged by her performance in therapies yesterday and PT provided emotional support and encouraged pt to advocate for herself when therapists provide too much assist.  Pt agrees to try this weekend with new therapists.  Pt returned to room at end of session and toileted with min assist for toileting and transfer.  Returned to bed with call bell in reach and needs met.   Therapy Documentation Precautions:  Precautions Precautions: Fall Precaution Comments: Impulsive Restrictions Weight Bearing Restrictions: No   See Function Navigator for Current Functional Status.   Therapy/Group: Individual Therapy  [name] Barnett 05/03/2017, 5:03 PM

## 2017-05-03 NOTE — Plan of Care (Signed)
Problem: RH Ambulation Goal: LTG Patient will ambulate in controlled environment (PT) LTG: Patient will ambulate in a controlled environment, # of feet with assistance (PT).  Downgraded 5/25 due to ongoing visual deficits and ataxia  Goal: LTG Patient will ambulate in home environment (PT) LTG: Patient will ambulate in home environment, # of feet with assistance (PT).  Downgraded 5/25 due to ongoing deficits with vision and balance Goal: LTG Patient will ambulate in community environment (PT) LTG: Patient will ambulate in community environment, # of feet with assistance (PT).  Outcome: Not Applicable Date Met: 44/81/85 Downgraded 5/25 due to ongoing deficits with vision and ataxia

## 2017-05-03 NOTE — Progress Notes (Signed)
Speech Language Pathology Weekly Progress and Session Note  Patient Details  Name: CYNDY BRAVER MRN: [id number] Date of Birth: 01-Apr-1976  Beginning of progress report period: Apr 26, 2017 End of progress report period: May 03, 2017  Today's Date: 05/03/2017 SLP Individual Time: 5329-9242 SLP Individual Time Calculation (min): 45 min  Short Term Goals: Week 2: SLP Short Term Goal 1 (Week 2): Patient will consume current diet without overt s/s of aspiration and Mod I for use of chin tuck. SLP Short Term Goal 1 - Progress (Week 2): Not met SLP Short Term Goal 2 (Week 2): Patient will recall new, daily information with Mod I.  SLP Short Term Goal 2 - Progress (Week 2): Met SLP Short Term Goal 3 (Week 2): Patient will demonstrate complex problem solving with Mod I.  SLP Short Term Goal 3 - Progress (Week 2): Not met SLP Short Term Goal 4 (Week 2): Patient will attend to LUE during functional tasks with Supervision verbal cues.  SLP Short Term Goal 4 - Progress (Week 2): Not met SLP Short Term Goal 5 (Week 2): Patient will identify activities that she will need assistance with at discharge with Mod I. SLP Short Term Goal 5 - Progress (Week 2): Met    New Short Term Goals: Week 3: SLP Short Term Goal 1 (Week 3): Patient will consume current diet without overt s/s of aspiration and Mod I for use of chin tuck. SLP Short Term Goal 2 (Week 3): Patient will demonstrate complex problem solving with Mod I.  SLP Short Term Goal 3 (Week 3): Patient will attend to LUE during functional tasks with Supervision verbal cues.   Weekly Progress Updates: Patient has made functional gains and has met 2 of 5 STG's this reporting period. Currently, patient is consuming regular textures with thin liquids and requires intermittent supervision verbal cues for use of swallowing compensatory strategies. Patient also requires overall Supervision-Min A verbal cues to complete functional and familiar tasks safely in  regards to complex problem solving, recall and attention to LUE during functional tasks. Patient continues to be limited by decreased visual acuity. Patient and family education is ongoing and patient would benefit from continued skilled SLP intervention to maximize her cognitive and swallowing function and overall functional independence prior to discharge.      Intensity: Minumum of 1-2 x/day, 30 to 90 minutes Frequency: 3 to 5 out of 7 days Duration/Length of Stay: 5/29 Treatment/Interventions: Cognitive remediation/compensation;Dysphagia/aspiration precaution training;Cueing hierarchy;Internal/external aids;Functional tasks;Patient/family education;Therapeutic Activities;Environmental controls   Daily Session  Skilled Therapeutic Interventions: Skilled treatment session focused on cognitive goals. SLP facilitated session by providing more than a reasonable amount of time and Min A verbal cues for problem solving in regards to sorting coins due to visual deficits and to attend to LUE during task. Patient also required extra time and Supervision verbal cues for problem solving with mildly complex mathenmatical mental manipulation tasks. Patient transferred back to bed at end of session with supervision. Patient left supine in bed with alarm on and all needs within reach. Continue with current plan of care.      Function:   Cognition Comprehension Comprehension assist level: Understands basic 90% of the time/cues < 10% of the time  Expression   Expression assist level: Expresses complex 90% of the time/cues < 10% of the time  Social Interaction Social Interaction assist level: Interacts appropriately 90% of the time - Needs monitoring or encouragement for participation or interaction.  Problem Solving Problem solving assist  level: Solves basic 75 - 89% of the time/requires cueing 10 - 24% of the time  Memory Memory assist level: More than reasonable amount of time   Pain No/Denies Pain    Therapy/Group: Individual Therapy  Pearl Bents, [place] 05/03/2017, 1:44 PM

## 2017-05-03 NOTE — Progress Notes (Signed)
Occupational Therapy Session Note  Patient Details  Name: [name] Barnett MRN: [id number] Date of Birth: 03/01/1976  Today's Date: 05/03/2017 OT Individual Time: 1600-1630 OT Individual Time Calculation (min): 30 min    Short Term Goals: Week 2:  OT Short Term Goal 1 (Week 2): STG=LTG 2/2 estimated LOS  Skilled Therapeutic Interventions/Progress Updates:    1:1. Pt husband present throughout session. Pt not reporting pain. Pt requires increased time with Vc to incorporate LUE into donning footwear and tieing shoes. Pt ambulates with MIN A using RW with VC for BOS, stride length, weight shifting and looking forward. Pt completes ambulatory couch transfer to mimic home environment in prep for d/c with touching A and VC for safety awareness. Pt ambulates back to room with MIN A hand held assist in LUE and RUE on handrail as target for weight shifting. Pt easily distractible with Vc to not talk while completing task. Exited session with pt seated in w/c with call light in reach and pt husband present in room.   Therapy Documentation Precautions:  Precautions Precautions: Fall Precaution Comments: Impulsive Restrictions Weight Bearing Restrictions: No  See Function Navigator for Current Functional Status.   Therapy/Group: Individual Therapy  Shon HaleStephanie M Abran Gavigan 05/03/2017, 5:12 PM

## 2017-05-03 NOTE — Plan of Care (Signed)
Problem: RH Balance Goal: LTG Patient will maintain dynamic standing with ADLs (OT) LTG:  Patient will maintain dynamic standing balance with assist during activities of daily living (OT)   Downgraded 5/25 ESD  Problem: RH Dressing Goal: LTG Patient will perform lower body dressing w/assist (OT) LTG: Patient will perform lower body dressing with assist, with/without cues in positioning using equipment (OT)  Downgraded 5/25 ESD  Problem: RH Toilet Transfers Goal: LTG Patient will perform toilet transfers w/assist (OT) LTG: Patient will perform toilet transfers with assist, with/without cues using equipment (OT)  Downgraded 5/25 ESD  Comments: Downgraded 5/25 ESD

## 2017-05-03 NOTE — Progress Notes (Signed)
[place] PHYSICAL MEDICINE & REHABILITATION     PROGRESS NOTE    Subjective/Complaints: Vision improving Right eyes, still markedly impaired on left.  Pt plans to see optho on day after D/C with OS vitrectomy the following day  ROS: pt denies nausea, vomiting,+ diarrhea, no cough, shortness of breath or chest pain   Objective: Vital Signs: Blood pressure 134/73, pulse 75, temperature 98.4 F (36.9 C), temperature source Oral, resp. rate 18, height 5\' 4"  (1.626 m), SpO2 99 %. No results found. No results for input(s): WBC, HGB, HCT, PLT in the last 72 hours. No results for input(s): NA, K, CL, GLUCOSE, BUN, CREATININE, CALCIUM in the last 72 hours.  Invalid input(s): CO CBG (last 3)  No results for input(s): GLUCAP in the last 72 hours.  Wt Readings from Last 3 Encounters:  04/07/17 65.8 kg (145 lb 1 oz)  02/08/12 51.7 kg (114 lb)  01/21/12 52.4 kg (115 lb 8.3 oz)    Physical Exam:  Constitutional: She appears well-developed. No distress.  HENT:  Mouth/Throat: Oropharynx is clear and moist.  Eyes:  Left peri-orbital ecchymosis, lid swelling, Unable to read large print   Neck: No tracheal deviationpresent. No thyromegalypresent.  Cardiovascular: Normal rate. Exam reveals no gallopand no friction rub.  No murmurheard. Respiratory: No respiratory distress. She has no wheezes. She has no rales.  GI: Soft. She exhibits no distension. There is no tenderness.  Neurological:  Right facial weakness is persistent. Speech clear. OSstruggles with horizontal tracking as well as superiorly.OD nystagmus Ataxia right upper and lower extremity. Right pronator drift. Strength 4/5 RUE prox to distal and RLE prox to distal. 4+ LUE and LLE. Fair insight and awareness. Follows commands.  Psychiatric: She has a normal mood and affect. Her behavior is normal.   Skin: wounds clean on scalp  Assessment/Plan: 1. Functional, mobility deficits  [facility] which [facility] 3+ hours  per day of interdisciplinary therapy in a comprehensive inpatient rehab setting. Physiatrist is providing close team supervision and 24 hour management of active medical problems listed below. Physiatrist and rehab team continue to assess barriers to discharge/monitor patient progress toward functional and medical goals.  Function:  Bathing Bathing position   Position: Shower  Bathing parts Body parts bathed by patient: Right arm, Left arm, Chest, Abdomen, Front perineal area, Buttocks, Left upper leg, Right upper leg Body parts bathed by helper: Right lower leg, Left lower leg  Bathing assist Assist Level: Touching or steadying assistance(Pt > 75%)      Upper Body Dressing/Undressing Upper body dressing   What is the patient wearing?: Pull over shirt/dress     Pull over shirt/dress - Perfomed by patient: Thread/unthread left sleeve, Thread/unthread right sleeve, Put head through opening Pull over shirt/dress - Perfomed by helper: Pull shirt over trunk        Upper body assist Assist Level: Supervision or verbal cues      Lower Body Dressing/Undressing Lower body dressing   What is the patient wearing?: Pants, Underwear, Non-skid slipper socks Underwear - Performed by patient: Thread/unthread right underwear leg, Thread/unthread left underwear leg Underwear - Performed by helper: Pull underwear up/down Pants- Performed by patient: Thread/unthread left pants leg, Thread/unthread right pants leg Pants- Performed by helper: Pull pants up/down Non-skid slipper socks- Performed by patient: Don/doff right sock, Don/doff left sock Non-skid slipper socks- Performed by helper: Don/doff left sock, Don/doff right sock  Lower body assist Assist for lower body dressing: Touching or steadying assistance (Pt > 75%)      Toileting Toileting Toileting activity did not occur: No continent bowel/bladder event Toileting steps completed by patient: Adjust clothing prior to  toileting, Performs perineal hygiene, Adjust clothing after toileting Toileting steps completed by helper: Adjust clothing after toileting Toileting Assistive Devices: Grab bar or rail  Toileting assist Assist level: Touching or steadying assistance (Pt.75%)   Transfers Chair/bed transfer   Chair/bed transfer method: Stand pivot Chair/bed transfer assist level: Touching or steadying assistance (Pt > 75%) Chair/bed transfer assistive device: Armrests, Patent attorney     Max distance: 150 Assist level: Moderate assist (Pt 50 - 74%)   Wheelchair   Type: Manual Max wheelchair distance: 25 Assist Level: Supervision or verbal cues  Cognition Comprehension Comprehension assist level: Understands complex 90% of the time/cues 10% of the time  Expression Expression assist level: Expresses complex 90% of the time/cues < 10% of the time  Social Interaction Social Interaction assist level: Interacts appropriately 90% of the time - Needs monitoring or encouragement for participation or interaction.  Problem Solving Problem solving assist level: Solves basic problems with no assist  Memory Memory assist level: More than reasonable amount of time   Medical Problem List and Plan: 1. 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 -continue CIR therapies PT, OT, speech, 2. DVT Prophylaxis/Anticoagulation: Mechanical: Sequential compression devices, below knee Bilateral lower extremities 3. Headaches/Pain Management:   -add prn tramadol  -topamax 25mg  bid , Increased to 50 mg , no headaches reported today 4. Mood: LCSW to follow for evaluation and support.  5. Neuropsych: This patient iscapable of making decisions on herown behalf. 6. Skin/Wound Care: Routine pressure relief measures.  7. Fluids/Electrolytes/Nutrition: Monitor I/O. Encourage PO  --potassium wnl  8. HTN: nimotop taper for vasospasms  -prn  clonidine  -Mainly elevated in a.m., trial doxazosin daily at bedtime, am BPs lower monitor for orthostasis Vitals:   05/02/17 1542 05/03/17 0504  BP: 117/65 134/73  Pulse: 82 75  Resp: 18 18  Temp: 97.8 F (36.6 C) 98.4 F (36.9 C)   9. 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 ABLA: Hgb 11.0--> 10.8 On 5/18 12. Leukocytosis: WBC's down to 14.5k 5/9m Will recheck  -afebrile, lungs remain clear, wounds clean  -UTI ecoli completed amoxil,  -steroid component to elevation as well.     .  13. Constipation: Cleaned out, avoid anti diarrheals to avoid further constipation,Change miralax to prn LOS (Days) 14 A FACE TO FACE EVALUATION WAS PERFORMED  Erick Colace, MD 05/03/2017 6:15 AM

## 2017-05-04 NOTE — Patient Care Conference (Signed)
Inpatient RehabilitationTeam Conference and Plan of Care Update Date: 05/01/2017   Time: 11:25 AM    Patient Name: [name] Barnett      Medical Record Number: [id number]  Date of Birth: 09/09/1976 Sex: Female         Room/Bed: 4W06C/4W06C-01 Payor Info: Payor: BLUE CROSS BLUE SHIELD / Plan: BCBS OTHER / Product Type: *No Product type* /    Admitting Diagnosis: SAH hemorrage  Admit Date/Time:  04/19/2017  6:08 PM Admission Comments: No comment available   Primary Diagnosis:  Subarachnoid hemorrhage due to ruptured aneurysm [facility]([name]) Principal Problem: Subarachnoid hemorrhage due to ruptured aneurysm [facility]([name])  Patient Active Problem List   Diagnosis Date Noted  . Adjustment disorder with mixed anxiety and depressed mood   . 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 SAH (subarachnoid hemorrhage) ([name]) 04/19/2017  . Basilar artery aneurysm ([name])   . Hypoxia   . 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 Elevated gastrin level 01/25/2012  . Hypokalemia 01/21/2012  . Nausea & vomiting 01/20/2012  . Epigastric pain 01/20/2012  . Duodenal ulcer, acute with obstruction 10/17/2011  . S/P laparoscopic cholecystectomy 10/16/2011    Expected Discharge Date: Expected Discharge Date: 05/07/17  Team Members Present: Physician leading conference: Dr. 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  Improve left upper extremity movement, improved vision  Laser surgery for pre-retinal bleed   Bowel/Bladder   continent of B and B         Swallow/Nutrition/  Hydration   npo for surgery today;  Mod I  Use of swallowing compensatory strategies    ADL's   Min A overall, intermittent Mod A standing balance  Mod I /supervision   pt/family ed, standing balance, sitting balance, L NMR, ataxia   Mobility   x1 assist stand and pivot to bsc/wheelchair  supervision/mod I ambulatory with LRAD  balance, coordination, gait, gradation of movement, visual compensatory strategies   Communication             Safety/Cognition/ Behavioral Observations  Supervision-Mod I  Mod I  complex problem solving, awareness, recall    Pain             Skin   no problems          Rehab Goals Patient on target to meet rehab goals: Yes Rehab Goals Revised: none *See Care Plan and progress notes for long and short-term goals.  Barriers to Discharge: Severe balance issues, visual deficits    Possible Resolutions to Barriers:  Continue rehabilitation extend stay a few days to allow recovery from surgery    Discharge Planning/Teaching Needs:  Pt to return to her home where her husband, son, or dtr-in-law will provide supervision and assistance, as needed.  Pt's husband is here regularly and goes through therapies with pt often.   Team Discussion:  Pt to have laser eye surgery today to try to coagulate the blood in her eyes impairing her vision.  Pt will be able to continue her therapy, but this may delay her meeting her goals by original d/c date, so d/c date  extended.  Pt has a headache and dizziness at times and this is being treated with tylenol.  Pt is doing better cognitively, but has wet vocal quality when she doesn't use chin tuck.  Pt's impaired vision causes pt and husband a lot of anxiety and her anxiety limits her.  PT is working on compensatory strategies for poor vision.  Pt is min A for gait and needs steering help for ataxia.  Pt is becoming more aware of using left arm/hand.  Revisions to Treatment Plan:  Extension of stay on CIR due to laser eye surgery    Continued Need for Acute Rehabilitation Level of Care: The patient requires daily medical management by a physician with specialized training in physical medicine and rehabilitation for the following conditions: Daily direction of a multidisciplinary physical rehabilitation program to ensure safe treatment while eliciting the highest outcome that is of practical value to the patient.: Yes Daily medical management of patient stability for increased activity during participation in an intensive rehabilitation regime.: Yes Daily analysis of laboratory values and/or radiology reports with any subsequent need for medication adjustment of medical intervention for : Neurological problems;Post surgical problems  Jadalyn Oliveri, [place] 05/04/2017, 12:24 AM

## 2017-05-04 NOTE — Progress Notes (Signed)
[name] Barnett is a 41 y.o. female 09/03/1976 [id number]  Subjective: C/o IV cath is bothering. No new problems. Slept well. Feeling OK.  Objective: Vital signs in last 24 hours: Temp:  [98 F (36.7 C)-98.3 F (36.8 C)] 98 F (36.7 C) (05/26 0518) Pulse Rate:  [88-98] 98 (05/26 0518) Resp:  [18] 18 (05/26 0518) BP: (104-142)/(57-69) 116/57 (05/26 0518) SpO2:  [98 %-100 %] 98 % (05/26 0518) Weight change:  Last BM Date: 05/02/17  Intake/Output from previous day: 05/25 0701 - 05/26 0700 In: 666 [P.O.:666] Out: -  Last cbgs: CBG (last 3)  No results for input(s): GLUCAP in the last 72 hours.   Physical Exam General: No apparent distress   HEENT: not dry R eye w/[place] hemorrhage Lungs: Normal effort. Lungs clear to auscultation, no crackles or wheezes. Cardiovascular: Regular rate and rhythm, no edema Abdomen: S/NT/ND; BS(+) Musculoskeletal:  unchanged Neurological: No new neurological deficits Wounds: clear  Skin: clear   Mental state: Alert, cooperative    Lab Results: BMET    Component Value Date/Time   NA 139 05/03/2017 0708   K 3.9 05/03/2017 0708   CL 109 05/03/2017 0708   CO2 21 (L) 05/03/2017 0708   GLUCOSE 93 05/03/2017 0708   BUN 16 05/03/2017 0708   CREATININE 0.63 05/03/2017 0708   CREATININE 1.18 (H) 09/12/2011 1533   CALCIUM 8.5 (L) 05/03/2017 0708   GFRNONAA >60 05/03/2017 0708   GFRAA >60 05/03/2017 0708   CBC    Component Value Date/Time   WBC 10.5 05/03/2017 0708   RBC 3.61 (L) 05/03/2017 0708   HGB 9.0 (L) 05/03/2017 0708   HCT 30.3 (L) 05/03/2017 0708   PLT 290 05/03/2017 0708   MCV 83.9 05/03/2017 0708   MCH 24.9 (L) 05/03/2017 0708   MCHC 29.7 (L) 05/03/2017 0708   RDW 17.3 (H) 05/03/2017 0708   LYMPHSABS 2.6 05/03/2017 0708   MONOABS 0.6 05/03/2017 0708   EOSABS 0.2 05/03/2017 0708   BASOSABS 0.0 05/03/2017 0708    Studies/Results: No results found.  Medications: I have reviewed the patient's current  medications.  Assessment/Plan:   1. SAH - cont w/CIR 2. DVT proph w/SCD 3. HAs. On Topamax 4. HTN. Monitoring BP 5. Bilateral retinal hemorrhages (Terson's syndrome) with left CN III palsy: continue conservative care/observation -expected resolution of hemorrhages over next several weeks -outpt optho recommended in 2 weeks (vs after discharge) -eye patching PRN 6. Hypokalemia - better 7. Constipation - LOC 8. Elevated WBC - due to steroids - monitor     Length of stay, days: 15  Sonda PrimesAlex Plotnikov , MD 05/04/2017, 11:08 AM

## 2017-05-04 NOTE — Progress Notes (Signed)
Social Work Patient ID: [name] Barnett, female   DOB: November 24, 1976, 41 y.o.   MRN: [id number]   CSW met with pt and her husband to update them on team conference discussion and extension of time on CIR with 05-07-17 targeted d/c date.  Pt's husband took next week off, so he can be with pt when she comes home.  He is anxious and wants her to stay as long as she needs to be here.  They were both hopeful surgery would improve pt's vision.  CSW visited them the next day and pt stated her vision was better, but not great.   She is hoping it will continue to improve.  CSW offered support and encouragement, too, for her recovery.  CSW remains available to assist and will set up f/u therapies and order any DME that pt may need.  CSW did set pt up with a new PCP as she did not have one.

## 2017-05-05 ENCOUNTER — Inpatient Hospital Stay (HOSPITAL_COMMUNITY): Payer: BLUE CROSS/BLUE SHIELD | Admitting: Occupational Therapy

## 2017-05-05 ENCOUNTER — Inpatient Hospital Stay (HOSPITAL_COMMUNITY): Payer: BLUE CROSS/BLUE SHIELD | Admitting: Physical Therapy

## 2017-05-05 MED ORDER — NIMODIPINE 30 MG PO CAPS
30.0000 mg | ORAL_CAPSULE | Freq: Four times a day (QID) | ORAL | Status: DC
  Administered 2017-05-05 – 2017-05-07 (×7): 30 mg via ORAL
  Filled 2017-05-05 (×7): qty 1

## 2017-05-05 NOTE — Progress Notes (Signed)
Occupational Therapy Session Note  Patient Details  Name: [name] Barnett F Bradly MRN: [id number] Date of Birth: 08/18/1976  Today's Date: 05/05/2017 OT Individual Time: [id number] OT Individual Time Calculation (min): 31 min    Short Term Goals: Week 2:  OT Short Term Goal 1 (Week 2): STG=LTG 2/2 estimated LOS  Skilled Therapeutic Interventions/Progress Updates:    Tx focus on functional transfers and d/c planning.    Pt greeted supine in bed, agreeable to tx. Pt donned shoes at EOB, tying laces with extra time and supervision. Pt transferred to w/c with Min A via stand step. Pt escorted to tub room and we practiced tub bench transfers. Discussed having visual aides to assist with safety during bathing. Afterwards we practiced simulated shower stall transfers with 5" threshold. Pt with major LOB forward with OT assist for correction. Pt with R knee hyperextension during this transfer and experienced 1 major LOB with therapist correction. Openly discussed her DME options with pt reporting that she felt safer with tub bench transfer; she plans to use front bathroom where tub shower is at home. At end of tx was escorted back to room and left with all needs within reach.   Min A for all shower transfers with RW due to safety/dizziness/small LOBs. Cues for walker safety throughout.   RN made aware of c/o dizziness    Therapy Documentation Precautions:  Precautions Precautions: Fall Precaution Comments: Impulsive Restrictions Weight Bearing Restrictions: No   Pain: No c/o pain during tx    ADL: ADL ADL Comments: Please see functional navigator    See Function Navigator for Current Functional Status.   Therapy/Group: Individual Therapy  Bethsaida Siegenthaler A Rockwell Zentz 05/05/2017, 12:52 PM

## 2017-05-05 NOTE — Progress Notes (Signed)
Occupational Therapy Session Note  Patient Details  Name: [name] Barnett MRN: [id number] Date of Birth: 04/11/1976  Today's Date: 05/05/2017 OT Individual Time: [id number] OT Individual Time Calculation (min): 60 min    Short Term Goals: Week 2:  OT Short Term Goal 1 (Week 2): STG=LTG 2/2 estimated LOS  Skilled Therapeutic Interventions/Progress Updates:    Treatment session with focus on ADL retraining with functional tranfers and functional use of LUE during bathing and dressing tasks.  Pt received in bed reporting fatigue but willing to engage in treatment session.  Ambulated to toilet with RW and cues for RW placement when turning to Rt as pt bumping in to door and grab bar on Lt due to decreased vision on Lt.  Completed toileting tasks with min/steady assist when standing.  Completed transfer in to room shower with min cues for technique.  Min guard throughout bathing due to tendency to lose balance to Lt with dynamic movements.  Min assist when standing to wash buttocks.  Dressing completed at sit > stand from w/c at sink, noted increased difficulty maintaining sitting balance without feet on floor.  Utilized leg rests for improved postural control during dressing tasks.  Min cues to incorporate LUE during bathing and dressing tasks.  Completed self-feeding with increased time to open containers.  Therapy Documentation Precautions:  Precautions Precautions: Fall Precaution Comments: Impulsive Restrictions Weight Bearing Restrictions: No General:   Vital Signs: Therapy Vitals Temp: 97.9 F (36.6 C) Temp Source: Oral Pulse Rate: 85 Resp: 18 BP: 127/71 Patient Position (if appropriate): Lying Oxygen Therapy SpO2: 98 % O2 Device: Not Delivered Pain: Pt with no c/o pain  See Function Navigator for Current Functional Status.   Therapy/Group: Individual Therapy  Rosalio LoudHOXIE, Rianne Degraaf 05/05/2017, 8:23 AM

## 2017-05-05 NOTE — Progress Notes (Addendum)
[name] Barnett is a 41 y.o. female 11/13/1976 [id number]  Subjective: No new complaints. No new problems. Slept well. Feeling OK.  Objective: Vital signs in last 24 hours: Temp:  [97.5 F (36.4 C)-97.9 F (36.6 C)] 97.9 F (36.6 C) (05/27 0433) Pulse Rate:  [69-85] 85 (05/27 0433) Resp:  [18] 18 (05/27 0433) BP: (126-135)/(67-72) 127/71 (05/27 0433) SpO2:  [98 %-100 %] 98 % (05/27 0433) Weight change:  Last BM Date: 05/04/17  Intake/Output from previous day: 05/26 0701 - 05/27 0700 In: 702 [P.O.:702] Out: -  Last cbgs: CBG (last 3)  No results for input(s): GLUCAP in the last 72 hours.   Physical Exam  General: No apparent distress   HEENT: not dry, eyes - no change from before Lungs: Normal effort. Lungs clear to auscultation, no crackles or wheezes. Cardiovascular: Regular rate and rhythm, no edema Abdomen: S/NT/ND; BS(+) Musculoskeletal:  unchanged Neurological: No new neurological deficits Wounds: healing   Skin: clear   Mental state: Alert, oriented, cooperative    Lab Results: BMET    Component Value Date/Time   NA 139 05/03/2017 0708   K 3.9 05/03/2017 0708   CL 109 05/03/2017 0708   CO2 21 (L) 05/03/2017 0708   GLUCOSE 93 05/03/2017 0708   BUN 16 05/03/2017 0708   CREATININE 0.63 05/03/2017 0708   CREATININE 1.18 (H) 09/12/2011 1533   CALCIUM 8.5 (L) 05/03/2017 0708   GFRNONAA >60 05/03/2017 0708   GFRAA >60 05/03/2017 0708   CBC    Component Value Date/Time   WBC 10.5 05/03/2017 0708   RBC 3.61 (L) 05/03/2017 0708   HGB 9.0 (L) 05/03/2017 0708   HCT 30.3 (L) 05/03/2017 0708   PLT 290 05/03/2017 0708   MCV 83.9 05/03/2017 0708   MCH 24.9 (L) 05/03/2017 0708   MCHC 29.7 (L) 05/03/2017 0708   RDW 17.3 (H) 05/03/2017 0708   LYMPHSABS 2.6 05/03/2017 0708   MONOABS 0.6 05/03/2017 0708   EOSABS 0.2 05/03/2017 0708   BASOSABS 0.0 05/03/2017 0708    Studies/Results: No results found.  Medications: I have reviewed the patient's current  medications.  Assessment/Plan:  1. SAH -- cont w/CIR 2. DVT prophylaxis - cont w/SCD 3. HAs. On Topamax 4. HTN. Monitoring BP. Titrating down Nimotop 5. Bilateral retinal hemorrhages (Terson's syndrome) with left CN III palsy: continue conservative care/observation -expected resolution of hemorrhages over next several weeks -outpt optho recommended in 2 weeks (vs after discharge) -eye patching PRN 6. Hypokalemia - corrected 7. Constipation - LOC    Length of stay, days: 16  Sonda PrimesAlex Jeramey Lanuza , MD 05/05/2017, 2:58 PM

## 2017-05-05 NOTE — Progress Notes (Signed)
Physical Therapy Session Note  Patient Details  Name: [name] Barnett MRN: [id number] Date of Birth: 05/02/1976  Today's Date: 05/05/2017 PT Individual Time: 1000-1100 and 1400-1500 PT Individual Time Calculation (min): 60 min and 60 min (total 120 min)   Short Term Goals: Week 2:  PT Short Term Goal 1 (Week 2): =LTGs due to ELOS  Skilled Therapeutic Interventions/Progress Updates: Tx 1: Pt received seated in w/c, c/o headache which she reports she was pre-medicated for and agreeable to treatment. Transport to/from gym totalA for time/energy conservation. Stand pivot transfer w/c <>mat table with RW and close S/min guard. Gait x50' with minA; note occasional L LOB and L genu recurvatum. Performed nustep x8 min with BUE/BLE for strengthening and aerobic endurance. Gait x25' with minA and RW. Stairs ascent/descent 16 stairs 6" height with B handrails and minA overall. Requires cues for attention to L hand placement on rail. Lateral step ups x15 reps RLE, x10 reps LLE before fatigued. Tactile cueing at L knee to reduce recurvatum and facilitate eccentric control. Pt dons B shoes with minA for tying R shoe, significantly increased time to tie L shoe d/t incoordination and decreased sensation. L heel wedge placed to assist with knee control and reduce recurvatum; gait x50' with RW and heel lift with minA. Continued recurvatum however with later onset in stance phase. Returned to room as above; stand pivot transfer with RW to bed with S. Sit >supine with S. Remained supine in bed with all needs in reach at completion of session.   Tx 2: Pt received seated in w/c, denies pain and agreeable to treatment. Transport to gym totalA in w/c. Stand pivot transfer min guard with RW. Supine bridging with alternating LE marching for increased weight bearing and stability through LLE; requires cues for hip stability and improved form with repetition. Quadruped alternating UE lifts, alternating LE lifts with increased  difficulty extending RLE d/t heavy weight bearing and ataxia through L side. Pt very confused when RLE extension was so difficult; attempted to educate pt in role of L side stabilizers while RLE extended however pt does not appear to understand connection. Semi-reclined situps with ball toss for focus on core strength and BUE coordination. Standing alternating toe taps to 3" step with variable min>max/totalA with pt performing very quickly and absent righting reactions when beginning to lose balance either R or L. Static standing balance in parallel bars with staggered stance, narrow stance without UE support; consistent LOB to L side with absent righting reactions LLE. Standing balance on rocker board oriented medial/lateral with far L weight shift until L hip on bar, then pt facilitating righting to midline with BUE support progressed to one UE>no UE support with repetition. Repeated staggered stance and narrow stance on stable surface to assess carryover with demonstration of mild improvement in midline orientation and LLE activation in righting reaction. Gait x100' with RW and min guard; cues to remain inside RW. Remained seated in w/c at end of session with husband present, all needs in reach.      Therapy Documentation Precautions:  Precautions Precautions: Fall Precaution Comments: Impulsive Restrictions Weight Bearing Restrictions: No   See Function Navigator for Current Functional Status.   Therapy/Group: Individual Therapy  Vista Lawmanlizabeth J Tygielski 05/05/2017, 11:23 AM

## 2017-05-06 ENCOUNTER — Inpatient Hospital Stay (HOSPITAL_COMMUNITY): Payer: BLUE CROSS/BLUE SHIELD | Admitting: Physical Therapy

## 2017-05-06 ENCOUNTER — Inpatient Hospital Stay (HOSPITAL_COMMUNITY): Payer: BLUE CROSS/BLUE SHIELD | Admitting: Speech Pathology

## 2017-05-06 DIAGNOSIS — H4311 Vitreous hemorrhage, right eye: Secondary | ICD-10-CM

## 2017-05-06 NOTE — Progress Notes (Signed)
Speech Language Pathology Daily Session Note  Patient Details  Name: [name] Barnett MRN: [id number] Date of Birth: 06/13/1976  Today's Date: 05/06/2017 SLP Individual Time: 1300-1400 SLP Individual Time Calculation (min): 60 min  Short Term Goals: Week 3: SLP Short Term Goal 1 (Week 3): Patient will consume current diet without overt s/s of aspiration and Mod I for use of chin tuck. SLP Short Term Goal 2 (Week 3): Patient will demonstrate complex problem solving with Mod I.  SLP Short Term Goal 3 (Week 3): Patient will attend to LUE during functional tasks with Supervision verbal cues.   Skilled Therapeutic Interventions: Skilled treatment session focused on addressing cognition goals. SLP facilitated session by providing Supervision level question cues for patient to attend to left upper extremity throughout session.  Patient completed daily math calculation with Min assist repeats due to working memory difficulty with the auditory task.  Education also completed with spouse regarding 24/7 recommendations as well as home management recommendations with focus on allowing patient to do as much as she can for herself, providing extra time, and assisting as needed with reminders and not doing things for patient.  Session ended with patient set-up with lunch tray and spouse assisting with set-up.  Continue with current plan of care.    Function:  Cognition Comprehension Comprehension assist level: Understands complex 90% of the time/cues 10% of the time  Expression   Expression assist level: Expresses complex 90% of the time/cues < 10% of the time  Social Interaction Social Interaction assist level: Interacts appropriately 90% of the time - Needs monitoring or encouragement for participation or interaction.  Problem Solving Problem solving assist level: Solves basic 90% of the time/requires cueing < 10% of the time  Memory Memory assist level: Recognizes or recalls 90% of the time/requires cueing  < 10% of the time    Pain Pain Assessment Pain Assessment: No/denies pain  Therapy/Group: Individual Therapy  Charlane FerrettiMelissa Kevon Tench, M.A., CCC-SLP [phone]  Delando Satter 05/06/2017, 1:59 PM

## 2017-05-06 NOTE — Progress Notes (Signed)
Physical Therapy Session Note  Patient Details  Name: [name] Barnett MRN: [id number] Date of Birth: [birth year]  Today's Date: 05/06/2017 PT Individual Time: 1015-1045 PT Individual Time Calculation (min): 30 min   Short Term Goals: Week 2:  PT Short Term Goal 1 (Week 2): =LTGs due to ELOS  Skilled Therapeutic Interventions/Progress Updates:    no c/o pain.  Session focus on transfers, visual scanning and LUE coordination during self care tasks, and ambulation.   Pt transitions supine>sit mod I and dons shoes EOB with set up assist.  Stand/pivot to w/c on L with supervision.  Pt positioned in front of mirror and completes self care tasks with increased time focus on visual scanning to locate items and LUE coordination.    Gait training x100' with RW and min assist overall.  Pt continues to be internally distracted requiring cues to focus on tasks for improved function and safety.  Pt returned to room at end of session and positioned in w/c with call bell in reach and needs met.   Therapy Documentation Precautions:  Precautions Precautions: Fall Precaution Comments: Impulsive Restrictions Weight Bearing Restrictions: No   See Function Navigator for Current Functional Status.   Therapy/Group: Individual Therapy  [name] 05/06/2017, 10:49 AM

## 2017-05-06 NOTE — Progress Notes (Signed)
Physical Therapy Session Note  Patient Details  Name: [name] Barnett MRN: [id number] Date of Birth: 10/09/1976  Today's Date: 05/06/2017 PT Individual Time: 1530-1630 PT Individual Time Calculation (min): 60 min   Short Term Goals: Week 2:  PT Short Term Goal 1 (Week 2): =LTGs due to ELOS  Skilled Therapeutic Interventions/Progress Updates:    no c/o pain.  Session focus on NMR, balance, and family training for transfers/ambulation.   Pt ambulates to therapy gym with RW with steady assist with intermittent bouts of close supervision, min tactile cues for posture and walker positioning.  Pt requesting to change out heel lift in L shoe, which therapist did, for smaller wedge to improve tolerance.  Gait x100' with RW and improved pt comfort as well as improvement in gait pattern with decreased need for tactile cues.  Pt's husband participated in hands on training for providing steady assist for ambulation with supervision from therapist x100'.  PT provided education to pt and husband regarding pt's CLOF and how to safely challenge her with gait at home.  Pt and husband verbalized understanding.  Pt completed 3 trials of static standing in split stance and with narrow BOS focus on maintaining midline in static position progress to maintaining midline with weight shifts R<>L.  Pt engaged in standing card matching task while in split stance (RLE forward) focus on visual scanning, use of LUE, and maintaining midline with min/mod tactile cues for midline.  Pt returned to room in w/c at end of session, call bell in reach and needs met.   Therapy Documentation Precautions:  Precautions Precautions: Fall Precaution Comments: Impulsive Restrictions Weight Bearing Restrictions: No   See Function Navigator for Current Functional Status.   Therapy/Group: Individual Therapy  [name] 05/06/2017, 4:53 PM

## 2017-05-06 NOTE — Progress Notes (Signed)
[place] [facility] PHYSICAL MEDICINE & REHABILITATION     PROGRESS NOTE    Subjective/Complaints: Pt seen laying in bed this AM.  She slept well overnight. She states she had a long weekend because show got bored.    ROS: Denies CP, SOB, N/V/D.   Objective: Vital Signs: Blood pressure (!) 146/82, pulse 82, temperature 97.9 F (36.6 C), temperature source Oral, resp. rate 18, height 5\' 4"  (1.626 m), SpO2 100 %. No results found. No results for input(s): WBC, HGB, HCT, PLT in the last 72 hours. No results for input(s): NA, K, CL, GLUCOSE, BUN, CREATININE, CALCIUM in the last 72 hours.  Invalid input(s): CO CBG (last 3)  No results for input(s): GLUCAP in the last 72 hours.  Wt Readings from Last 3 Encounters:  04/07/17 65.8 kg (145 lb 1 oz)  02/08/12 51.7 kg (114 lb)  01/21/12 52.4 kg (115 lb 8.3 oz)    Physical Exam:  Constitutional: She appears well-developed. No distress.  HENT: Normocephalic, atraumatic. Eyes:  Left peri-orbital ecchymosis, lid swelling, hemorrhage in right eye Cardiovascular: Normal rate and rhythm.  Respiratory: No respiratory distress. She has no wheezes.  GI: Soft. She exhibits no distension.  Right facial weakness is persistent.  Speech clear.  Ataxia right upper and lower extremity.  Motor: 4/5 RUE prox to distal and RLE prox to distal.  4+ LUE and LLE.  Fair insight and awareness.  Psychiatric: She has a normal mood and affect. Her behavior is normal.  Skin: wounds clean on scalp  Assessment/Plan: 1. Functional, mobility deficits  secondary to [facility] which require 3+ hours per day of interdisciplinary therapy in a comprehensive inpatient rehab setting. Physiatrist is providing close team supervision and 24 hour management of active medical problems listed below. Physiatrist and rehab team continue to assess barriers to discharge/monitor patient progress toward functional and medical goals.  Function:  Bathing Bathing position   Position: Shower   Bathing parts Body parts bathed by patient: Right arm, Left arm, Chest, Abdomen, Front perineal area, Buttocks, Left upper leg, Right upper leg, Right lower leg, Left lower leg Body parts bathed by helper: Right lower leg, Left lower leg  Bathing assist Assist Level: Touching or steadying assistance(Pt > 75%)      Upper Body Dressing/Undressing Upper body dressing   What is the patient wearing?: Pull over shirt/dress     Pull over shirt/dress - Perfomed by patient: Thread/unthread right sleeve, Thread/unthread left sleeve, Put head through opening, Pull shirt over trunk Pull over shirt/dress - Perfomed by helper: Pull shirt over trunk        Upper body assist Assist Level: Set up   Set up : To obtain clothing/put away  Lower Body Dressing/Undressing Lower body dressing   What is the patient wearing?: Underwear, Pants, Non-skid slipper socks Underwear - Performed by patient: Thread/unthread left underwear leg, Thread/unthread right underwear leg, Pull underwear up/down Underwear - Performed by helper: Pull underwear up/down Pants- Performed by patient: Thread/unthread right pants leg, Thread/unthread left pants leg, Pull pants up/down Pants- Performed by helper: Pull pants up/down Non-skid slipper socks- Performed by patient: Don/doff right sock, Don/doff left sock Non-skid slipper socks- Performed by helper: Don/doff left sock, Don/doff right sock     Shoes - Performed by patient: Don/doff right shoe, Don/doff left shoe, Fasten right, Fasten left            Lower body assist Assist for lower body dressing: Touching or steadying assistance (Pt > 75%)  Toileting Toileting Toileting activity did not occur: No continent bowel/bladder event Toileting steps completed by patient: Performs perineal hygiene, Adjust clothing prior to toileting Toileting steps completed by helper: Adjust clothing after toileting Toileting Assistive Devices: Grab bar or rail  Toileting assist  Assist level: Touching or steadying assistance (Pt.75%)   Transfers Chair/bed transfer   Chair/bed transfer method: Stand pivot Chair/bed transfer assist level: Supervision or verbal cues Chair/bed transfer assistive device: Armrests     Locomotion Ambulation     Max distance: 50 Assist level: Touching or steadying assistance (Pt > 75%)   Wheelchair   Type: Manual Max wheelchair distance: 25 Assist Level: Supervision or verbal cues  Cognition Comprehension Comprehension assist level: Follows basic conversation/direction with extra time/assistive device  Expression Expression assist level: Expresses basic 90% of the time/requires cueing < 10% of the time.  Social Interaction Social Interaction assist level: Interacts appropriately 90% of the time - Needs monitoring or encouragement for participation or interaction.  Problem Solving Problem solving assist level: Solves basic 75 - 89% of the time/requires cueing 10 - 24% of the time  Memory Memory assist level: More than reasonable amount of time   Medical Problem List and Plan: 1. 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    Cont CIR  Notes reviewed, images reviewed 2. DVT Prophylaxis/Anticoagulation: Mechanical: Sequential compression devices, below knee Bilateral lower extremities 3. Headaches/Pain Management:   -added prn tramadol  -topamax 25mg  bid , Increased to 50 mg  4. Mood: LCSW to follow for evaluation and support.  5. Neuropsych: This patient iscapable of making decisions on herown behalf. 6. Skin/Wound Care: Routine pressure relief measures.  7. Fluids/Electrolytes/Nutrition: Monitor I/O. Encourage PO 8. HTN:   nimotop taper for vasospasms  -prn clonidine  Overall controlled 5/28 9. 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 ABLA:   Hgb 9.0 on 5/25   Cont to monitor 12. Leukocytosis:   WBC's 10.5 on 5/25  -afebrile, lungs remain clear  13. Constipation:   Improved  LOS (Days) 17 A FACE TO FACE EVALUATION WAS PERFORMED  Trequan Marsolek Karis JubaAnil Elizbeth Posa, MD 05/06/2017 10:27 AM

## 2017-05-06 NOTE — Progress Notes (Signed)
A/O, no noted distress or pain. Educated spouse on gtts, he mastered the skill.

## 2017-05-07 ENCOUNTER — Inpatient Hospital Stay (HOSPITAL_COMMUNITY): Payer: BLUE CROSS/BLUE SHIELD | Admitting: Physical Therapy

## 2017-05-07 ENCOUNTER — Inpatient Hospital Stay (HOSPITAL_COMMUNITY): Payer: BLUE CROSS/BLUE SHIELD | Admitting: Speech Pathology

## 2017-05-07 ENCOUNTER — Inpatient Hospital Stay (HOSPITAL_COMMUNITY): Payer: BLUE CROSS/BLUE SHIELD | Admitting: Occupational Therapy

## 2017-05-07 DIAGNOSIS — I10 Essential (primary) hypertension: Secondary | ICD-10-CM

## 2017-05-07 MED ORDER — TOBRAMYCIN-DEXAMETHASONE 0.3-0.1 % OP OINT: TOPICAL_OINTMENT | Freq: Two times a day (BID) | OPHTHALMIC | 0 refills | Status: DC

## 2017-05-07 MED ORDER — TOPIRAMATE 50 MG PO TABS: 50.0000 mg | ORAL_TABLET | Freq: Two times a day (BID) | ORAL | 0 refills | Status: DC

## 2017-05-07 MED ORDER — DOXAZOSIN MESYLATE 2 MG PO TABS: 2.0000 mg | ORAL_TABLET | Freq: Every day | ORAL | 1 refills | Status: DC

## 2017-05-07 MED ORDER — OFLOXACIN 0.3 % OP SOLN: 1.0000 [drp] | Freq: Four times a day (QID) | OPHTHALMIC | 0 refills | Status: DC

## 2017-05-07 MED ORDER — PREDNISOLONE ACETATE 1 % OP SUSP: 1.0000 [drp] | Freq: Four times a day (QID) | OPHTHALMIC | 0 refills | Status: DC

## 2017-05-07 MED ORDER — PANTOPRAZOLE SODIUM 40 MG PO TBEC: 40.0000 mg | DELAYED_RELEASE_TABLET | Freq: Every day | ORAL | 1 refills | Status: DC | PRN

## 2017-05-07 NOTE — Progress Notes (Signed)
Pt discharged home at 1300 with husband. Discharge instructions given to pt and husband with verbal understanding. Belongings with pt.

## 2017-05-07 NOTE — Progress Notes (Signed)
Social Work Discharge Note  The overall goal for the admission was met for:   Discharge location: Yes - home with husband and dtr  Length of Stay: Yes - 18 days  Discharge activity level: Yes, but with downgraded goals for minimal assistance.  Other tasks goals met for supervision level.  Home/community participation: Yes  Services provided included: MD, RD, PT, OT, SLP, RN, TR, Pharmacy, Neuropsych and SW  Financial Services: Private Insurance: [employer]  Follow-up services arranged: Home Health: PT/OT/ST, DME: rolling walker and tub transfer bench and Patient/Family has no preference for HH/DME agencies - both HH and DME provided by [facility]  Comments (or additional information): Pt to go to her home with her husband who will coordinate f/u plan and he and other family members to provide supervision.  She will have [place] f/u and DME was delivered to pt's room prior to her d/c. Support group information given.  Pt already has paperwork completed for her employer.  CSW set pt up with new PCP and appt made.    Patient/Family verbalized understanding of follow-up arrangements: Yes  Individual responsible for coordination of the follow-up plan: pt's husband and other family members to assist  Confirmed correct DME delivered: Trey Sailors 05/07/2017    Khaleem Burchill, Silvestre Mesi

## 2017-05-07 NOTE — Progress Notes (Signed)
Speech Language Pathology Session Note & Discharge Summary  Patient Details  Name: [name] Barnett MRN: [id number] Date of Birth: 1976/04/03  Today's Date: 05/07/2017 SLP Individual Time: 1030-1110 SLP Individual Time Calculation (min): 40 min   Skilled Therapeutic Interventions:  Skilled treatment session focused on cognitive goals. Patient was Mod I for anticipatory awareness in regards to identifying tasks she can safely perform at home and tasks that will require supervision and assistance from family. Patient consumed thin liquids via straw without use of the chin tuck without overt s/s of aspiration. However, recommend patient continue trials without chin tuck prior to discharging strategy. Patient left upright in wheelchair with all needs within reach. Continue with current plan of care.   Patient has met 4 of 4 long term goals.  Patient to discharge at overall Supervision;Modified Independent level.   Reasons goals not met: N/A   Clinical Impression/Discharge Summary: Patient has made excellent gains and has met 4 of 4 LTG's this admission. Currently, patient is consuming regular textures with thin liquids with minimal overt s/s of aspiration and intermittent supervision verbal cues for use of swallowing compensatory strategies. Patient also requires intermittent supervision for recall of functional information and complex problem solving but is Mod I for awareness and attention. Patient and family education is complete and patient will discharge home with assistance from family. Patient would benefit from f/u SLP services to maximize her swallowing and cognitive function in order to reduce caregiver burden.   Care Partner:  Caregiver Able to Provide Assistance: Yes  Type of Caregiver Assistance: Physical;Cognitive  Recommendation:  24 hour supervision/assistance;Outpatient SLP  Rationale for SLP Follow Up: Maximize cognitive function and independence;Maximize swallowing safety    Equipment: N/A   Reasons for discharge: Treatment goals met;Discharged from hospital   Patient/Family Agrees with Progress Made and Goals Achieved: Yes   Function:  Eating Eating                 Cognition Comprehension Comprehension assist level: Understands complex 90% of the time/cues 10% of the time  Expression   Expression assist level: Expresses complex 90% of the time/cues < 10% of the time  Social Interaction Social Interaction assist level: Interacts appropriately 90% of the time - Needs monitoring or encouragement for participation or interaction.  Problem Solving Problem solving assist level: Solves basic 90% of the time/requires cueing < 10% of the time  Memory Memory assist level: Recognizes or recalls 90% of the time/requires cueing < 10% of the time   Brock Mokry 05/07/2017, 3:20 PM

## 2017-05-07 NOTE — Progress Notes (Signed)
Physical Therapy Discharge Summary  Patient Details  Name: [name] Barnett MRN: [id number] Date of Birth: May 07, 1976  Today's Date: 05/07/2017 PT Individual Time: 0900-1000 PT Individual Time Calculation (min): 60 min    Patient has met 9 of 10 long term goals due to improved activity tolerance, improved balance, improved postural control, increased strength, ability to compensate for deficits, functional use of  left upper extremity and left lower extremity, improved attention, improved awareness and improved coordination.  Patient to discharge at an ambulatory level supervision/min assist.   Patient's care partner is independent to provide the necessary physical assistance at discharge.  Reasons goals not met: Pt continues to require supervision for stand/pivot transfers due to ataxia and poor trunk control in standing; does not require cues.  Recommendation:  Patient will benefit from ongoing skilled PT services in home health setting to continue to advance safe functional mobility, address ongoing impairments in coordination, postural control, balance, and motor control, and minimize fall risk.  Equipment: RW  Reasons for discharge: treatment goals met and discharge from hospital  Patient/family agrees with progress made and goals achieved: Yes   Skilled PT Intervention: No c/o pain.  Session focus on d/c assessment and d/c education.  Reviewed family education with pt regarding steady assist for ambulation and supervision for stairs/transfers.  Instructed pt in multiple stand/pivot transfers throughout session all with supervision for safety, though pt requires no cues.  Gait with RW max distance 186' with min guard overall, but intermittent bouts of 8-10' with close supervision.  Stair negotiation with supervision/min guard.  Gait up/down ramp with min guard and verbal cues for walker positioning.  Pt returned to room in w/c at end of session and position with call bell in reach and needs  met.   PT Discharge Precautions/Restrictions Precautions Precautions: Fall Precaution Comments: ataxic Restrictions Weight Bearing Restrictions: No Vision/Perception  Vision - Assessment Additional Comments: R eye s/p surgical repair of vitreol/retinal hemorrhage, blurry vision small spot of darkness in R eye that does not disappear with movement Perception Inattention/Neglect: Other (comment) (improving L inattention) Praxis Praxis: Intact  Cognition Overall Cognitive Status: Impaired/Different from baseline Arousal/Alertness: Awake/alert Orientation Level: Oriented X4 Attention: Alternating Alternating Attention: Appears intact Safety/Judgment: Appears intact Sensation Sensation Light Touch: Appears Intact Proprioception: Impaired by gross assessment Additional Comments: LLE ataxia Coordination Gross Motor Movements are Fluid and Coordinated: No Fine Motor Movements are Fluid and Coordinated: No Coordination and Movement Description: LUE/LLE ataxia, improving since eval Motor  Motor Motor: Ataxia Motor - Discharge Observations: ataxia improving from time of eval  Mobility Bed Mobility Bed Mobility: Sit to Supine;Supine to Sit Rolling Right: 6: Modified independent (Device/Increase time) Supine to Sit: 6: Modified independent (Device/Increase time) Sit to Supine: 6: Modified independent (Device/Increase time) Transfers Transfers: Yes Sit to Stand: 6: Modified independent (Device/Increase time) Stand to Sit: 6: Modified independent (Device/Increase time) Stand Pivot Transfers: 5: Supervision Locomotion  Ambulation Ambulation: Yes Ambulation/Gait Assistance: 4: Min guard Ambulation Distance (Feet): 186 Feet Assistive device: Rolling walker Ambulation/Gait Assistance Details: Tactile cues for posture;Tactile cues for placement Gait Gait: Yes Gait Pattern: Impaired Gait Pattern: Step-to pattern;Step-through pattern;Left genu recurvatum;Ataxic Stairs / Additional  Locomotion Stairs: Yes Stairs Assistance: 5: Supervision;4: Min guard Stair Management Technique: Two rails Number of Stairs: 12 Wheelchair Mobility Wheelchair Mobility: No  Trunk/Postural Assessment  Cervical Assessment Cervical Assessment: Within Functional Limits Thoracic Assessment Thoracic Assessment: Within Functional Limits Lumbar Assessment Lumbar Assessment: Within Functional Limits Postural Control Postural Control: Deficits on evaluation Righting Reactions: significantly  delayed Protective Responses: significantly delayed  Balance Static Sitting Balance Static Sitting - Level of Assistance: 6: Modified independent (Device/Increase time) Dynamic Sitting Balance Dynamic Sitting - Level of Assistance: 6: Modified independent (Device/Increase time) Static Standing Balance Static Standing - Balance Support: Left upper extremity supported;Right upper extremity supported;During functional activity Static Standing - Level of Assistance: 5: Stand by assistance Dynamic Standing Balance Dynamic Standing - Balance Support: During functional activity Dynamic Standing - Level of Assistance: 4: Min assist Extremity Assessment      RLE Assessment RLE Assessment: Within Functional Limits RLE AROM (degrees) RLE Overall AROM Comments: WFL assessed in sitting RLE Strength Right Hip Flexion: 4/5 Right Knee Flexion: 4/5 Right Knee Extension: 4/5 Right Ankle Dorsiflexion: 4/5 Right Ankle Plantar Flexion: 4/5 LLE Assessment LLE Assessment: Exceptions to WFL LLE AROM (degrees) LLE Overall AROM Comments: WFL assessed in sitting LLE Strength Left Hip Flexion: 4+/5 Left Knee Flexion: 4+/5 Left Knee Extension: 4+/5 Left Ankle Dorsiflexion: 4+/5 Left Ankle Plantar Flexion: 4+/5   See Function Navigator for Current Functional Status.  [name] Barnett 05/07/2017, 10:07 AM

## 2017-05-07 NOTE — Discharge Summary (Signed)
Physician Discharge Summary  Patient ID: [name] Barnett MRN: [id number] DOB/AGE: [birth year] 41 y.o.  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   Terson syndrome of both eyes ([name])   Adjustment disorder with mixed anxiety and depressed mood   Vitreous hemorrhage of right eye ([name])   Benign essential HTN   Discharged Condition: stable   Labs:  Basic Metabolic Panel: BMP Latest Ref Rng & Units 05/03/2017 04/26/2017 04/20/2017  Glucose 65 - 99 mg/dL 93 90 578(I)  BUN 6 - 20 mg/dL 16 14 14   Creatinine 0.44 - 1.00 mg/dL 6.96 2.95 2.84  Sodium 135 - 145 mmol/L 139 137 137  Potassium 3.5 - 5.1 mmol/L 3.9 4.1 4.2  Chloride 101 - 111 mmol/L 109 103 101  CO2 22 - 32 mmol/L 21(L) 24 28  Calcium 8.9 - 10.3 mg/dL 1.3(K) 9.2 9.1    CBC: CBC Latest Ref Rng & Units 05/03/2017 04/26/2017 04/22/2017  WBC 4.0 - 10.5 K/uL 10.5 14.9(H) 20.9(H)  Hemoglobin 12.0 - 15.0 g/dL 9.0(L) 10.8(L) 11.0(L)  Hematocrit 36.0 - 46.0 % 30.3(L) 36.5 35.8(L)  Platelets 150 - 400 K/uL 290 369 339    CBG: No results for input(s): GLUCAP in the last 168 hours.   Brief HPI:   [name] Barnett a 41 y.o.femalewith history of HTN, GERD who was admitted on 04/07/17 with worsening of HA, episode of emesis followed by LOC with fall and LLE weakness. 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 She was taken to OR by Dr. 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 EVD placed at bedside with improvement in LOC  and IS used to help improve low lung volumes. 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 EVD removed 5/10.  She has had reports of changes in vision and opthalmology evaluation showed evidence of bilateral preretinal/vitreous hemorrhage associated with SAH. Dr. Genia Del indicated that vitreous hemorrhage should resolve over next several weeks. She continues to have persistent left > right sided headaches. Patient with resultant ataxic gait with balance deficits, tendency to lean to the left, decrease in visual acuity as well as proprioceptive deficits. CIR recommended due to significant deficits in mobility and decrease in ability to perform ADL tasks.    Hospital Course: [name] Barnett was admitted to rehab 04/19/2017 for inpatient therapies to consist of PT, ST and OT at least three hours five days a week. Past admission physiatrist, therapy team and rehab RN have worked together to provide customized collaborative inpatient rehab. Topamax was added to help manage constant headaches and has been slowly titrated upwards without side effects. Scalp incision is healing well without signs or symptoms of infection. Blood pressures were monitored on bid basis and Cardura was added to better control.  Nimotop was tapered to 30 mg qid and discontinued at discharge. Cardura was increased to 2 mg for more consistent BP control. ABLA has been monitored with serial checks and has been stable. She was found to have E coli UTI at admission and was treated with  Amoxicillin X 7 days. Follow up CBC showed that reactive leucocytosis has resolved and no signs of recurrent infection reported.   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 She did have worsening of vision with visual loss on 5/18 and  Dr. Genia Del was consulted for input. He recommended monitoring as vitreous hemorrhage would clear in time but due to lack of  improvement, Dr Carmela Rima was consulted and recommended PPV OD to allow patient to gain vision. She underwent  [place] vitrectomy with endolaser on 5/23 and has had improvement in the vision in her right eye. She has follow up appointment with Dr. Allena Katz on 5/30 with plans for surgery OS on 5/31. Dr Rodenbough/neuropsych has been following patient to help with stress management, anxiety and adjustment disorder.  She has had improvement in activity tolerance, balance and mood. She has progressed to min assist level as she continues to be limited by ataxia and visual deficits. She will continue to receive follow up HHPT and HHOT by [facility] after discharge.     Rehab course: During patient's stay in rehab weekly team conferences were held to monitor patient's progress, set goals and discuss barriers to discharge. At admission, she demonstrated mild dysphagia requiring chin tuck with liquids and required full supervision for safety. She exhibited mild cognitive deficits impacting recall, problems solving and safety with functional tasks. She required mod to max assist with ADL tasks and mod assist with mobility. She has had improvement in activity tolerance, balance, postural control, as well as ability to compensate for deficits. She is showing improvement in use of LUE and LLE with improved coordination. She is able to complete ADL tasks with set up and close supervision with cues for balance strategies. She requires supervision for stand/pivot transfers due to ataxia and poor truncal control. She is able to ambulate 73' with min/guard assist and RW. She is tolerating regular diet without overt s/s of aspiration requires intermittent supervision verbal cues for safe swallow strategies, recall and for complex problem solving. She has had improvement in awareness of deficits as well as attention. Family education was completed with husband regarding all aspects of care and mobility. Husband to  provide assistance after discharge.    Disposition: 01-Home or Self Care  Diet: Regular  Special Instructions: 1. No driving or strenuous activity till cleared by MD. 2. Contact primary MD for adjustment in BP medications.   Discharge Instructions    Ambulatory referral to Physical Medicine Rehab    Complete by:  As directed    1-2 weeks transitional care appt   Care order/instruction    Complete by:  As directed    May remove patch in 4 hours after return to floor and start drops Pred Forte and Ofloxacin.  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   ofloxacin 0.3 % ophthalmic solution Commonly known as:  OCUFLOX Place 1 drop into the right eye 4 (four) times daily. Use thorough June 6th   pantoprazole 40 MG tablet Commonly known as:  PROTONIX Take 1 tablet (40 mg total) by mouth daily as needed. What changed:  when to take this  reasons to take this   prednisoLONE acetate 1 % ophthalmic suspension Commonly known as:  PRED FORTE Place 1 drop into the right eye 4 (four) times daily. Taper one drop less each week.  To complete drops at 4 weeks Notes to patient:  Use one drop in each eye 4 X  day. Starting Thursday decrease to one drop right eye 3 X day. Next Thursday decrease to 2 X day.  Then a week later one drop daily for a week then stop   tobramycin-dexamethasone ophthalmic ointment Commonly known as:  TOBRADEX Place into the right eye 2 (two) times daily.   topiramate 50 MG tablet Commonly known as:  TOPAMAX Take 1 tablet (50 mg total) by mouth 2 (two) times daily.      Follow-up Information    Kirsteins, Victorino SparrowAndrew E, MD Follow up.   Specialty:  Physical Medicine and Rehabilitation Why:  office will call you with follow up appointment Contact information: [address] [place] [address] [phone]        Lisbeth RenshawNundkumar, Neelesh, MD. Call in 1 day(s).   Specialty:  Neurosurgery Why:  for follow up appointment in 2 weeks. Contact information: [address] [place] [address] [phone]        Marcelline DeistMincey, Andrew Julian, MD. Call.   Specialty:  Ophthalmology Why:  for follow up appointment in 1-2 weeks.  Contact information: [address] [place] [address] [phone]        Thomasene Lotpalski, Deborah, DO. Go on 05/21/2017.   Specialty:  Family Medicine Why:  @ 9:30 AM Contact information: [address] [place] [address] [phone]        Carmela RimaPatel, Narendra, MD Follow up.   Specialty:  Ophthalmology Why:  Has appt for follow up tomorrow Contact information: [address] [place] [address] [phone]        Aura CampsSpencer, Michael, MD Follow up.   Specialty:  Ophthalmology Why:  for double vision/Neuro-opthalmology Contact information: [address] [place] [address] [phone]           Signed: Jacquelynn CreeLove, Pamela S 05/07/2017, 5:19 PM

## 2017-05-07 NOTE — Discharge Instructions (Signed)
Inpatient Rehab Discharge Instructions  Clovia Cuffmber F Linck Discharge date and time:  05/07/17  Activities/Precautions/ Functional Status: Activity: no lifting, driving, or strenuous exercise  till cleared by MD Diet: regular diet Wound Care: keep wound clean and dry   Functional status:  ___ No restrictions     ___ Walk up steps independently _X__ 24/7 supervision/assistance   ___ Walk up steps with assistance ___ Intermittent supervision/assistance  ___ Bathe/dress independently _X__ Walk with walker    _X__ Bathe/dress with assistance ___ Walk Independently    ___ Shower independently ___ Walk with assistance    ___ Shower with assistance _X__ No alcohol     ___ Return to work/school ________   COMMUNITY REFERRALS UPON DISCHARGE:   Home Health:   PT     OT       Agency:  [facility] Phone:  [phone] Medical Equipment/Items Ordered:  Rolling walker and tub transfer bench  Agency/Supplier:  [facility]         Phone:  [phone]  GENERAL COMMUNITY RESOURCES FOR PATIENT/FAMILY: Support Groups:  [facility]                              Meets the 2nd Thursday of every month (except June, July, and August) at [place]3PM                              In the dayroom of [facility] [place]                              Call [phone] for more information  Special Instructions: 1. Contact primary MD if blood pressures remain elevated.  2. Take your time with positional changes in the morning to avoid dizziness/side effects of BP medications.   My questions have been answered and I understand these instructions. I will adhere to these goals and the provided educational materials after my discharge from the hospital.  Patient/Caregiver Signature _______________________________ Date __________  Clinician Signature _______________________________________ Date __________  Please bring this form and your medication list  with you to all your follow-up doctor's appointments.

## 2017-05-07 NOTE — Progress Notes (Signed)
[facility] PHYSICAL MEDICINE & REHABILITATION     PROGRESS NOTE    Subjective/Complaints: Pt seen sitting up at the edge of the bed, eating breakfast this AM.  She slept well overnight and she states she is ready to go home after being in the hospital for so long.  ROS: Denies CP, SOB, N/V/D.   Objective: Vital Signs: Blood pressure (!) 150/83, pulse 79, temperature 98.1 F (36.7 C), temperature source Oral, resp. rate 18, height 5\' 4"  (1.626 m), SpO2 100 %. No results found. No results for input(s): WBC, HGB, HCT, PLT in the last 72 hours. No results for input(s): NA, K, CL, GLUCOSE, BUN, CREATININE, CALCIUM in the last 72 hours.  Invalid input(s): CO CBG (last 3)  No results for input(s): GLUCAP in the last 72 hours.  Wt Readings from Last 3 Encounters:  04/07/17 65.8 kg (145 lb 1 oz)  02/08/12 51.7 kg (114 lb)  01/21/12 52.4 kg (115 lb 8.3 oz)    Physical Exam:  Constitutional: She appears well-developed. No distress.  HENT: Normocephalic, atraumatic. Eyes:  Left peri-orbital ecchymosis, lid swelling, hemorrhage in right eye Cardiovascular: Normal rate and rhythm. No JVD. Respiratory: No respiratory distress. She has no wheezes.  GI: Soft. She exhibits no distension.  Right facial weakness is persistent.  Speech clear.  Ataxia right upper and lower extremity.  Motor: 4/5 RUE prox to distal and RLE prox to distal.  4+ LUE and LLE (stable).  Fair insight and awareness.  Psychiatric: She has a normal mood and affect. Her behavior is normal.  Skin: wounds clean on scalp  Assessment/Plan: 1. Functional, mobility deficits  secondary to [facility] which require 3+ hours per day of interdisciplinary therapy in a comprehensive inpatient rehab setting. Physiatrist is providing close team supervision and 24 hour management of active medical problems listed below. Physiatrist and rehab team continue to assess barriers to discharge/monitor patient progress toward functional and  medical goals.  Function:  Bathing Bathing position   Position: Shower  Bathing parts Body parts bathed by patient: Right arm, Left arm, Chest, Abdomen, Front perineal area, Buttocks, Left upper leg, Right upper leg, Right lower leg, Left lower leg Body parts bathed by helper: Right lower leg, Left lower leg  Bathing assist Assist Level: Touching or steadying assistance(Pt > 75%)      Upper Body Dressing/Undressing Upper body dressing   What is the patient wearing?: Pull over shirt/dress     Pull over shirt/dress - Perfomed by patient: Thread/unthread right sleeve, Thread/unthread left sleeve, Put head through opening, Pull shirt over trunk Pull over shirt/dress - Perfomed by helper: Pull shirt over trunk        Upper body assist Assist Level: Set up   Set up : To obtain clothing/put away  Lower Body Dressing/Undressing Lower body dressing   What is the patient wearing?: Underwear, Pants, Non-skid slipper socks Underwear - Performed by patient: Thread/unthread left underwear leg, Thread/unthread right underwear leg, Pull underwear up/down Underwear - Performed by helper: Pull underwear up/down Pants- Performed by patient: Thread/unthread right pants leg, Thread/unthread left pants leg, Pull pants up/down Pants- Performed by helper: Pull pants up/down Non-skid slipper socks- Performed by patient: Don/doff right sock, Don/doff left sock Non-skid slipper socks- Performed by helper: Don/doff left sock, Don/doff right sock Socks - Performed by patient: Don/doff right sock, Don/doff left sock   Shoes - Performed by patient: Don/doff right shoe, Don/doff left shoe, Fasten right, Fasten left  Lower body assist Assist for lower body dressing: Touching or steadying assistance (Pt > 75%)      Toileting Toileting Toileting activity did not occur: No continent bowel/bladder event Toileting steps completed by patient: Performs perineal hygiene, Adjust clothing prior to  toileting Toileting steps completed by helper: Adjust clothing after toileting Toileting Assistive Devices: Grab bar or rail  Toileting assist Assist level: Touching or steadying assistance (Pt.75%)   Transfers Chair/bed transfer   Chair/bed transfer method: Stand pivot Chair/bed transfer assist level: Supervision or verbal cues Chair/bed transfer assistive device: Armrests     Locomotion Ambulation     Max distance: 186 Assist level: Touching or steadying assistance (Pt > 75%)   Wheelchair   Type: Manual Max wheelchair distance: 25 Assist Level: Supervision or verbal cues  Cognition Comprehension Comprehension assist level: Understands complex 90% of the time/cues 10% of the time  Expression Expression assist level: Expresses complex 90% of the time/cues < 10% of the time  Social Interaction Social Interaction assist level: Interacts appropriately 90% of the time - Needs monitoring or encouragement for participation or interaction.  Problem Solving Problem solving assist level: Solves basic 90% of the time/requires cueing < 10% of the time  Memory Memory assist level: Recognizes or recalls 90% of the time/requires cueing < 10% of the time   Medical Problem List and Plan: 1. 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    D/c today  Pt to follow up with Dr. Wynn BankerKirsteins for transitional care management in 1-2 weeks. 2. DVT Prophylaxis/Anticoagulation: Mechanical: Sequential compression devices, below knee Bilateral lower extremities 3. Headaches/Pain Management:   -added prn tramadol  -topamax 25mg  bid , Increased to 50 mg  4. Mood: LCSW to follow for evaluation and support.  5. Neuropsych: This patient iscapable of making decisions on herown behalf. 6. Skin/Wound Care: Routine pressure relief measures.  7. Fluids/Electrolytes/Nutrition: Monitor I/O. Encourage PO 8. HTN:   nimotop taper for vasospasms  -prn  clonidine  Overall controlled 5/29 9. Bilateral retinal hemorrhages (Terson's syndrome) with left CN III palsy: continue conservative care/observation- -outpt optho for Left eye vitrectomy -eye patching OS  11. ABLA:   Hgb 9.0 on 5/25   Cont to monitor 12. Leukocytosis:   WBC's 10.5 on 5/25  -afebrile, lungs remain clear  13. Constipation:   Improved  LOS (Days) 18 A FACE TO FACE EVALUATION WAS PERFORMED  Kaleigh Spiegelman Karis JubaAnil Ronrico Dupin, MD 05/07/2017 9:47 AM

## 2017-05-07 NOTE — Plan of Care (Signed)
Problem: RH Bed to Chair Transfers Goal: LTG Patient will perform bed/chair transfers w/assist (PT) LTG: Patient will perform bed/chair transfers with assistance, with/without cues (PT).  Outcome: Not Met (add Reason) Continues to require supervision for stand/pivot transfers for safety  Problem: RH Car Transfers Goal: LTG Patient will perform car transfers with assist (PT) LTG: Patient will perform car transfers with assistance (PT).  Outcome: Completed/Met Date Met: 05/07/17 Able to transfer stand/pivot w/c<>car mod I, requires supervision for ambulatory transfer with RW

## 2017-05-07 NOTE — Progress Notes (Signed)
Occupational Therapy Discharge Summary  Patient Details  Name: [name] Barnett MRN: [id number] Date of Birth: 05-Aug-1976  Today's Date: 05/07/2017 OT Individual Time: 1517-6160 OT Individual Time Calculation (min): 50 min   OT treatment session focused on functional mobility, DME modifications for low vision, balance, and functional use of L UE. Discussed home environment modifications for safety within ADL tasks. OT set-up new RW for pt height, then added bright orange coband to handles. Pt ambulated in room and completed functional transfers with close supervision and RW. UB dressing completed mod I with supervision and min cues for balance strategies when donning pants. Verbal cues to integrate L UE 25% of the time. Pt then ambulated to therapy apartment with 1 seated rest break, with overall supervision, progressing to Min guard A 2/2 L LE fatigue. Educated pt on RW placement and safe kitchen access-pt demonstrated understanding. Pt returned to room in wc 2/2 time management and left with needs met.   Patient has met 12 of 12 long term goals due to improved activity tolerance, improved balance, postural control, ability to compensate for deficits, functional use of  LEFT upper and LEFT lower extremity, improved attention, improved awareness and improved coordination.  Patient to discharge at overall Supervision level.  Patient's care partner is independent to provide the necessary physical assistance at discharge.    Reasons goals not met: n/a  Recommendation:  Patient will benefit from ongoing skilled OT services in home health setting to continue to advance functional skills in the area of BADL and iADL.  Equipment: RW, and tub transfer bench  Reasons for discharge: treatment goals met and discharge from hospital  Patient/family agrees with progress made and goals achieved: Yes  OT Discharge Precautions/Restrictions  Precautions Precautions: Fall Precaution Comments:  ataxic Restrictions Weight Bearing Restrictions: No Pain Pain Assessment Pain Assessment: No/denies pain Pain Score: 0-No pain ADL ADL Eating: Modified independent Grooming: Modified independent Upper Body Bathing: Supervision/safety Where Assessed-Upper Body Bathing: Shower Lower Body Bathing: Supervision/safety Where Assessed-Lower Body Bathing: Shower Upper Body Dressing: Modified independent (Device) Lower Body Dressing: Supervision/safety Toileting: Modified independent Toilet Transfer: Close supervision Toilet Transfer Method: Counselling psychologist: Raised toilet seat Tub/Shower Transfer: Close supervison Clinical cytogeneticist Method: Optometrist: Radio broadcast assistant ADL Comments: Please see functional navigator Vision Baseline Vision/History: No visual deficits Patient Visual Report: Blurring of vision;Other (comment) (R eye blurry, no vision in L eye) Vision Assessment?: Vision impaired- to be further tested in functional context Additional Comments: R eye s/p surgical repair of vitreol/retinal hemorrhage, blurry vision small spot of darkness in R eye that does not disappear with movement Perception  Inattention/Neglect: Other (comment) (improving L inattention) Praxis Praxis: Intact Cognition Overall Cognitive Status: Impaired/Different from baseline Arousal/Alertness: Awake/alert Orientation Level: Oriented X4 Attention: Alternating Alternating Attention: Appears intact Safety/Judgment: Appears intact Sensation Sensation Light Touch: Appears Intact Proprioception: Impaired by gross assessment Additional Comments: LLE and L UE ataxia Coordination Gross Motor Movements are Fluid and Coordinated: No Fine Motor Movements are Fluid and Coordinated: No Coordination and Movement Description: LUE/LLE ataxia, improving since eval Motor  Motor Motor: Ataxia Motor - Discharge Observations: ataxia improving from time of eval Mobility   Transfers Sit to Stand: 6: Modified independent (Device/Increase time) Stand to Sit: 6: Modified independent (Device/Increase time)  Trunk/Postural Assessment  Cervical Assessment Cervical Assessment: Within Functional Limits Thoracic Assessment Thoracic Assessment: Within Functional Limits Lumbar Assessment Lumbar Assessment: Within Functional Limits Postural Control Postural Control: Deficits on evaluation Righting Reactions: significantly delayed Protective Responses: significantly  delayed  Balance Balance Balance Assessed: Yes Static Sitting Balance Static Sitting - Balance Support: Feet supported Static Sitting - Level of Assistance: 6: Modified independent (Device/Increase time) Dynamic Sitting Balance Dynamic Sitting - Balance Support: During functional activity Dynamic Sitting - Level of Assistance: 6: Modified independent (Device/Increase time) Static Standing Balance Static Standing - Balance Support: Left upper extremity supported;Right upper extremity supported;During functional activity Static Standing - Level of Assistance: 5: Stand by assistance Dynamic Standing Balance Dynamic Standing - Balance Support: During functional activity Dynamic Standing - Level of Assistance: 5: Stand by assistance Extremity/Trunk Assessment RUE Assessment RUE Assessment: Within Functional Limits LUE Assessment LUE Assessment: Within Functional Limits (deficits in coordination)   See Function Navigator for Current Functional Status.  Daneen Schick Koula Venier 05/07/2017, 12:49 PM

## 2017-05-13 ENCOUNTER — Telehealth: Payer: Self-pay | Admitting: *Deleted

## 2017-05-13 ENCOUNTER — Telehealth: Payer: Self-pay | Admitting: Physical Medicine and Rehabilitation

## 2017-05-13 NOTE — Telephone Encounter (Signed)
Spoke with Dr. Wynn BankerKirsteins face to face.  He agreed to see the patient in a 15 minute time slot.  Patient rescheduled for 12:30 PM on 05/14/2017.  Contacted patient, they will come tomorrow.  See phone thread from Delle ReiningPamela Love for more information

## 2017-05-13 NOTE — Telephone Encounter (Signed)
Husband concerned about patient's excessive dizziness affecting mobility. Cardura increased at discharge due to elevated BP and he feels that this might be causing SE.  Advised to call primary MD for BP check and evaluation and evaluation. Also asked hime to to contact Dr. Conchita ParisNundkumar for input and to rule out neurologic cause.

## 2017-05-13 NOTE — Telephone Encounter (Signed)
Patient's husband reports that patient's blood pressure medication prescribed at hospital discharge is causing EXCESSIVE dizzines.  They tried calling the personnel at the hospital, but were directed to call us here at the clinic. Husband wants this resolved. Urgent

## 2017-05-14 ENCOUNTER — Encounter: Payer: Self-pay | Admitting: Physical Medicine & Rehabilitation

## 2017-05-14 ENCOUNTER — Ambulatory Visit (HOSPITAL_BASED_OUTPATIENT_CLINIC_OR_DEPARTMENT_OTHER): Payer: BLUE CROSS/BLUE SHIELD | Admitting: Physical Medicine & Rehabilitation

## 2017-05-14 ENCOUNTER — Encounter: Payer: BLUE CROSS/BLUE SHIELD | Attending: Physical Medicine & Rehabilitation

## 2017-05-14 VITALS — BP 97/66 | HR 101

## 2017-05-14 DIAGNOSIS — I608 Other nontraumatic subarachnoid hemorrhage: Secondary | ICD-10-CM | POA: Diagnosis not present

## 2017-05-14 DIAGNOSIS — R269 Unspecified abnormalities of gait and mobility: Secondary | ICD-10-CM | POA: Diagnosis not present

## 2017-05-14 DIAGNOSIS — I69398 Other sequelae of cerebral infarction: Secondary | ICD-10-CM

## 2017-05-14 DIAGNOSIS — I69393 Ataxia following cerebral infarction: Secondary | ICD-10-CM | POA: Diagnosis not present

## 2017-05-14 DIAGNOSIS — R42 Dizziness and giddiness: Secondary | ICD-10-CM | POA: Insufficient documentation

## 2017-05-14 DIAGNOSIS — I951 Orthostatic hypotension: Secondary | ICD-10-CM

## 2017-05-14 DIAGNOSIS — I69093 Ataxia following nontraumatic subarachnoid hemorrhage: Secondary | ICD-10-CM | POA: Diagnosis not present

## 2017-05-14 DIAGNOSIS — I725 Aneurysm of other precerebral arteries: Secondary | ICD-10-CM | POA: Diagnosis present

## 2017-05-14 NOTE — Progress Notes (Signed)
Subjective:    Patient ID: [name] CuffAmber F Barnett, female    DOB: 03/30/1976, 41 y.o.   MRN: [id number]  HPI Patient complains of dizziness that started worsening after discharge from the hospital. She states that she was placed on Cardura upon discharge from the hospital. States that she was not on this in the hospital and I do not see it on the progress notes in the hospital. Cardura 2 mg was on the discharge medication list, however. Patient states that she tried taking a half a tablet last night. She still had dizziness today. She states that the dizziness has limited her ability to participate in therapy and she has Center therapist home. Approximate 1 week ago underwent left eye vitrectomy. She is now reading better. Pain Inventory Average Pain 0 Pain Right Now 0 My pain is .  In the last 24 hours, has pain interfered with the following? General activity 1 Relation with others 1 Enjoyment of life 1 What TIME of day is your pain at its worst? na Sleep (in general) Fair  Pain is worse with: na' Pain improves with: na Relief from Meds: na  Mobility walk with assistance use a walker ability to climb steps?  yes do you drive?  no  Function disabled: date disabled 4/18 I need assistance with the following:  bathing, toileting and household duties  Neuro/Psych weakness numbness tingling trouble walking dizziness  Prior Studies Any changes since last visit?  no  Physicians involved in your care Any changes since last visit?  no   Family History  Problem Relation Age of Onset  . Malignant hyperthermia Neg Hx    Social History   Social History  . Marital status: Married    Spouse name: N/A  . Number of children: N/A  . Years of education: N/A   Social History Main Topics  . Smoking status: Current Every Day Smoker    Packs/day: 1.00    Years: 20.00    Types: Cigarettes    Last attempt to quit: 10/17/2011  . Smokeless tobacco: Never Used  . Alcohol use No  .  Drug use: No  . Sexual activity: Not on file   Other Topics Concern  . Not on file   Social History Narrative   [place] 2 kids. 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   Past Surgical History:  Procedure Laterality Date  . BALLOON DILATION  12/31/2011   Procedure: BALLOON DILATION;  Surgeon: Barrie FolkJohn C Hayes, MD;  [facility];  Service: Endoscopy;  Laterality: N/A;  . CHOLECYSTECTOMY  07/28/11  . ESOPHAGOGASTRODUODENOSCOPY  10/17/2011   Procedure: ESOPHAGOGASTRODUODENOSCOPY (EGD);  Surgeon: Barrie FolkJohn C Hayes, MD;  [facility];  Service: Endoscopy;  Laterality: N/A;  . IR ANGIO INTRA EXTRACRAN SEL INTERNAL CAROTID BILAT MOD SED  04/07/2017  . IR ANGIO VERTEBRAL SEL VERTEBRAL UNI R MOD SED  04/07/2017  . IR ANGIOGRAM FOLLOW UP STUDY  04/07/2017  . IR ANGIOGRAM FOLLOW UP STUDY  04/07/2017  . IR ANGIOGRAM FOLLOW UP STUDY  04/07/2017  . IR ANGIOGRAM FOLLOW UP STUDY  04/07/2017  . IR ANGIOGRAM FOLLOW UP STUDY  04/07/2017  . IR ANGIOGRAM SELECTIVE EACH ADDITIONAL VESSEL  04/07/2017  . IR TRANSCATH/EMBOLIZ  04/07/2017  . PARS PLANA VITRECTOMY Right 05/01/2017   Procedure: PARS PLANA VITRECTOMY WITH 25 GAUGE RIGHT EYE, endolaser photocoaglation;  Surgeon: Carmela RimaPatel, Narendra, MD;  [facility];  Service: Ophthalmology;  Laterality: Right;  . RADIOLOGY WITH ANESTHESIA N/A 04/07/2017  Procedure: RADIOLOGY WITH ANESTHESIA;  Surgeon: Lisbeth Renshaw, MD;  Location[facility];  Service: Radiology;  Laterality: N/A;  . TEMPOROMANDIBULAR JOINT SURGERY     2 surgeries  . VAGOTOMY  01/23/2012   Procedure: VAGOTOMY, antrectomy and BII;  Surgeon: Currie Paris, MD;  Location: [facility] OR;  Service: General;  Laterality: N/A;  Laparotomy with vagotomy.   Past Medical History:  Diagnosis Date  . Duodenal obstruction   . GERD (gastroesophageal reflux disease)   . Hypertension    There were no vitals taken for this visit.  Opioid Risk Score:   Fall Risk Score:  `1  Depression screen PHQ  2/9  No flowsheet data found.  Review of Systems  Constitutional: Negative.   HENT: Negative.   Eyes: Negative.   Respiratory: Negative.   Cardiovascular: Negative.   Gastrointestinal: Negative.   Endocrine: Negative.   Genitourinary: Negative.   Musculoskeletal: Negative.   Skin: Negative.   Allergic/Immunologic: Negative.   Neurological: Negative.   Hematological: Negative.   Psychiatric/Behavioral: Negative.   All other systems reviewed and are negative.      Objective:   Physical Exam  Constitutional: She is oriented to person, place, and time. She appears well-developed and well-nourished.  HENT:  Head: Normocephalic and atraumatic.  Neck: Normal range of motion. No JVD present.  Cardiovascular: Normal rate, regular rhythm and normal heart sounds.   No murmur heard. Pulmonary/Chest: Effort normal and breath sounds normal. No stridor. No respiratory distress. She has no wheezes.  Abdominal: Soft. Bowel sounds are normal. She exhibits no distension. There is no tenderness.  Neurological: She is alert and oriented to person, place, and time.  Skin: Skin is warm and dry.  Psychiatric: She has a normal mood and affect. Her behavior is normal. Judgment and thought content normal.  Nursing note and vitals reviewed.  Ataxia, left upper extremity, moderately severe with finger-nose-finger, right upper extremity, normal Decreased temperature sensation right upper extremity versus left upper extremity. Eyes show subconjunctival hemorrhage bilaterally. Worse on the left side than on the right side. Periorbital ecchymosis on the left side only.  Motor strength is 5/5 in the right deltoid by stress. Her grip, 4 minus at the left deltoid, biceps, triceps, grip 4/5. Bilateral hip flexor, knee extensor, ankle dorsal flexor. Sit to stand is with supervision. She has a wide base of support. She feels her dizziness increases with standing.  Left eye unable to adduct, limited upward and  downward gaze     Assessment & Plan:  1. 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 Temperature sensation abnormalities and left cranial nerve III palsy, recommend continued outpatient PT, OT  2. Dizziness, likely due to orthostatic hypotension, discontinue Cardura, follow-up PCP 1 week. If dizziness symptoms do not subside. May consider taper of Topamax.  3. Vascular headache improved after Topamax started in rehabilitation Hospital  Physical medicine rehabilitation follow-up in 6 weeks

## 2017-05-14 NOTE — Patient Instructions (Addendum)
Will discontinue Cardura. Primary care M.D. will check blood pressure in the office next week.  The Cardura medication might take 3-4 days to fully leave the system. If the dizziness persists after one week, it is likely not do to the Cardura. We may have to reduce the Topamax if the dizziness persists off the Cardura.  The blood pressure readings did show a significant drop between standing and laying down or sitting. This would indicate that is the blood pressure is going to low due to the Cardura.

## 2017-05-21 ENCOUNTER — Encounter: Payer: Self-pay | Admitting: Family Medicine

## 2017-05-21 ENCOUNTER — Ambulatory Visit (INDEPENDENT_AMBULATORY_CARE_PROVIDER_SITE_OTHER): Payer: BLUE CROSS/BLUE SHIELD | Admitting: Family Medicine

## 2017-05-21 VITALS — BP 146/83 | HR 80 | Ht 63.25 in | Wt 145.0 lb

## 2017-05-21 DIAGNOSIS — H4311 Vitreous hemorrhage, right eye: Secondary | ICD-10-CM | POA: Diagnosis not present

## 2017-05-21 DIAGNOSIS — H4313 Vitreous hemorrhage, bilateral: Secondary | ICD-10-CM | POA: Diagnosis not present

## 2017-05-21 DIAGNOSIS — F4323 Adjustment disorder with mixed anxiety and depressed mood: Secondary | ICD-10-CM

## 2017-05-21 DIAGNOSIS — I725 Aneurysm of other precerebral arteries: Secondary | ICD-10-CM

## 2017-05-21 DIAGNOSIS — I10 Essential (primary) hypertension: Secondary | ICD-10-CM

## 2017-05-21 DIAGNOSIS — R531 Weakness: Secondary | ICD-10-CM

## 2017-05-21 DIAGNOSIS — R42 Dizziness and giddiness: Secondary | ICD-10-CM

## 2017-05-21 DIAGNOSIS — Z87891 Personal history of nicotine dependence: Secondary | ICD-10-CM

## 2017-05-21 DIAGNOSIS — I639 Cerebral infarction, unspecified: Secondary | ICD-10-CM | POA: Diagnosis not present

## 2017-05-21 DIAGNOSIS — I69319 Unspecified symptoms and signs involving cognitive functions following cerebral infarction: Secondary | ICD-10-CM | POA: Diagnosis not present

## 2017-05-21 DIAGNOSIS — I608 Other nontraumatic subarachnoid hemorrhage: Secondary | ICD-10-CM | POA: Diagnosis not present

## 2017-05-21 NOTE — Progress Notes (Signed)
New patient office visit note:  Impression and Recommendations:    1. Benign essential HTN   2. Weakness with dizziness-  since [name] and CVA 04/07/17   3. History of tobacco abuse-  30pk yr hx - quit 04/07/17   4. h/o Adjustment disorder with mixed anxiety and depressed mood   5. 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 Basilar artery aneurysm ([name])   7. Cerebrovascular accident (CVA), unspecified mechanism ([name])   8. 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  Bring this in, in approximately 3 weeks for recheck.  Let us know sooner than your planned follow-up if you have any new onset of symptoms -  I'll try to touch base with Dr. Domingo Cocking and your Neurosurgeon re: best blood pressure medicines/ goal BP's if they have an opinion on that.  I am looking for their input in regards to your chronic dizziness and BP lability since your Neurological events.  CVA (cerebral vascular accident) ([name]) Chronic weakness with chronic dizziness since [name] and CVA [phone]:  -Cont PT -Please try to work on walking daily per the recs of your therapist- using your walker to help build strength and for mental health and wellness   -Fall precautions d/c pt.     Basilar artery aneurysm ([name]) - We'll obtain fasting labs on patient to rule out diabetes, check fasting lipid profile and others.   - See orders.  History of tobacco abuse-  30pk yr hx - quit 04/07/17 Patient understands importance of abstinence   Subarachnoid hemorrhage due to ruptured aneurysm [name]) - Asked patient to get me neurosurgeon's OV notes. - txmnt  plan per them.  Adjustment disorder with mixed anxiety and depressed mood Discussed with patient a counselor would be very beneficial for her to obtain one, to discuss her concerns and issues.    The patient was counseled, risk factors were discussed, anticipatory guidance given.  Pt was in the office today for 40+ minutes, with over 50% time spent in face to face counseling of patients various medical conditions, treatment plans of those medical conditions including medicine management and lifestyle modification, strategies to improve health and well being; and in coordination of care. SEE ABOVE FOR DETAILS    Orders Placed This Encounter  Procedures  . CBC with Differential/Platelet  . Comprehensive metabolic panel  . Hemoglobin A1c  . HIV antibody  . Hepatitis C antibody  . Lipid panel  . VITAMIN D 25 Hydroxy (Vit-D Deficiency, Fractures)  . Vitamin B12  . TSH  . T4, free  . Magnesium  . Phosphorus   Return for  3-4 weeks follow-up after you see neurosurgeon. bring in BP log.  Please see AVS handed out to patient at the end of our visit for further patient instructions/ counseling done pertaining to today's office visit.    Note: This document was prepared using Dragon voice recognition software and may include unintentional dictation errors.  ----------------------------------------------------------------------------------------------------------------------    Subjective:    Chief complaint:   Chief  Complaint  Patient presents with  . Establish Care     HPI: [name] is a pleasant 41 y.o. female who presents to [name] today to review their medical history with me and establish care.   I asked the patient to review their chronic problem list with me to ensure everything was updated and accurate.    All recent office visits with other providers, any medical records that patient brought in etc  - I reviewed today.     Also  asked pt to get me medical records from [facility] providers/ specialists that they had seen within the past 3-5 years- if they are in private practice and/or do not work for a [employer], [name], [name], [name] or [name] owned practice.  Told them to call their specialists to clarify this if they are not sure.   Married- 22 yrs- [name], 30 daughter and 56 yo son.   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    Prior - smoker many yrs- since 41yo and about 30 pk yr hx   GYn doc--- [name] doc prior- 7 yrs ago.  Married- no new sexual partners    Problem  Neuritis-  R sided:  arm and leg/ body due to stroke  Elevated Ldl Cholesterol Level  History of tobacco abuse-  30pk yr hx - quit 04/07/17   Smoked since 15 years; over a ppd at times.  30 pk yr hx.  Quit on day of her stroke- 04/07/17     Benign Essential Htn    6-8 yrs ago dx with HTN.  Saw PCP in past- but they closed and pt lost to f/up- no txmnt for 5-6 yrs prior to CVA/ SAH.   HTN with chronic dizziness due to stroke/ SAH---> dx with htn past 6-8 yrs and no care 5-6 yrs prior to her stroke and SAH.     Bp at home  Runs around 130's /80's per pt.    Patient complains of dizziness that started worsening after discharge from [facility] post-stroke.   Pt states that she was placed on Cardura upon discharge from the [facility]. ( No Bp meds prior for many yrs due to lost to f/up - noncompliance)  States endorses that she was not on this in the [facility] and it was not on the progress notes from the [facility].  Cardura 2 mg was on the discharge medication list, however.     Patient states that she tried taking the medicine but it has worsened her dizziness sx.   She still has dizziness today.   She states that the dizziness has limited her ability to participate in therapy and its very emotionally distressing.  Recently seen by Dr Clementeen Hoof who's note I reviewed.   --.  She was told to discontinue the Cardura medicine and  follow-up with [place].  Patient does not recall what blood pressure medicine she was on in the distant past.   Per Dr. Illa Level note's, Topamax can also cause dizziness.    Basilar Artery Aneurysm ([name])  Subarachnoid Hemorrhage Due to Ruptured Aneurysm ([name])   Follows with Dr Conchita Paris ( N-sx)  and Dr Clementeen Hoof ( PM&R)    Cva (Cerebral Vascular Accident) ([name])   Follows with Dr Conchita Paris ( N-sx)  and Dr Clementeen Hoof ( PM&R)    Vitamin D Deficiency  Adjustment Disorder With Mixed Anxiety and Depressed Mood   Patient without history of mood disorder but  has been very down and depressed since this event.  She is frustrated with her weakness and chronic dizziness.  She is frustrated with her self that she didn't take better care of herself as well.  She denies any suicidal or homicidal ideations.   Alteration of Sensation As Late Effect of Stroke  Gait Disturbance, Post-Stroke  Vitreous Hemorrhage of Right Eye ([name])   [facility]    Pt underwent left eye vitrectomy 05/01/17.   She is now reading better and is happy about that   s/p SAH (subarachnoid hemorrhage) ([name])   Subarachnoid hemorrhage due to basilar artery aneurysm, residual left hemi-ataxia, truncal ataxia as well as right lower and upper extremity. Temperature sensation abnormalities and left cranial nerve III palsy on 04/07/17.  Vision effected, gen weakness, chronic dizziness now.   Chronic dizziness described as she feels "swimmy-headed" and "head feels heavy".   No room spinning or vertigo sx.   Never had these sx prior   Duodenal Ulcer, Acute With Obstruction   V&A with BII on 01/23/12   Weakness with dizziness-  [facility]  Disturbances of Vision, Late Effect of Stroke   Patient had bilateral retinal hemorrhages associated with her recent SAH.    Pt underwent left eye vitrectomy 05/01/17.   She is now reading better and is happy about that   Cognitive  Deficit Due to Old Embolic Stroke   Brain aurysm and small stroke-->  Poorly control over L side- numb Face and lost of fine coordination, R side numb( can';t feel hot/cold or tactile stim)  Seeing NeuroSx.    I do not have their notes for my review.   Terson Syndrome of Both Eyes ([name])    [facility]    Pt underwent left eye vitrectomy 05/01/17.   She is now reading better and is happy about that   Hypokalemia      Wt Readings from Last 3 Encounters:  08/01/17 155 lb (70.3 kg)  07/11/17 152 lb (68.9 kg)  06/13/17 149 lb (67.6 kg)   BP Readings from Last 3 Encounters:  08/06/17 101/67  08/05/17 108/78  08/01/17 116/76   Pulse Readings from Last 3 Encounters:  08/06/17 87  08/01/17 93  07/15/17 77   BMI Readings from Last 3 Encounters:  08/01/17 27.24 kg/m  07/11/17 26.71 kg/m  06/13/17 26.19 kg/m    Patient Care Team    Relationship Specialty Notifications Start End  Thomasene Lotpalski, Via Rosado, DO PCP - General Family Medicine  04/30/17   Charlott RakesSchooler, Vincent, MD Consulting Physician Gastroenterology  10/17/11   Lisbeth RenshawNundkumar, Neelesh, MD Consulting Physician Neurosurgery  05/21/17   Erick ColaceKirsteins, Andrew E, MD Consulting Physician Physical Medicine and Rehabilitation  05/21/17   Olivia Mackieaavon, Richard, MD Consulting Physician Obstetrics and Gynecology  05/21/17   Carmela RimaPatel, Narendra, MD Consulting Physician Ophthalmology  06/13/17    Comment: Neuro ophthalmologist    Patient Active Problem List   Diagnosis Date Noted  . Neuritis-  R sided:  arm and leg/ body due to stroke 08/05/2017    Priority: High  . Elevated LDL cholesterol level 07/11/2017    Priority: High  . History of tobacco abuse-  30pk yr hx - quit 04/07/17 05/21/2017    Priority: High  . Benign essential HTN     Priority: High  . Basilar artery aneurysm ([name])     Priority: High  . Subarachnoid hemorrhage due  to ruptured aneurysm ([name]) 04/07/2017    Priority: High  . CVA  (cerebral vascular accident) ([name]) 04/07/2017    Priority: High  . Vitamin D deficiency 05/29/2017    Priority: Medium  . Adjustment disorder with mixed anxiety and depressed mood     Priority: Medium  . 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 Gait disturbance, post-stroke 05/14/2017    Priority: Low  . Vitreous hemorrhage of right eye ([name])     Priority: Low  . s/p SAH (subarachnoid hemorrhage) ([name]) 04/19/2017    Priority: Low  . Duodenal ulcer, acute with obstruction 10/17/2011    Priority: Low  . Weakness with dizziness-  since [facility] and CVA 04/07/17 08/07/2017  . Disturbances of vision, late effect of stroke 08/06/2017  . Health education/counseling 08/05/2017  . High risk medications (not anticoagulants) long-term use 08/05/2017  . [employer] (NIH) Stroke Scale limb ataxia score 2, ataxia present in two limbs 06/25/2017  . 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 Hypoxia   . Elevated gastrin level 01/25/2012  . Hypokalemia 01/21/2012  . Nausea & vomiting 01/20/2012  . Epigastric pain 01/20/2012  . S/P laparoscopic cholecystectomy 10/16/2011     Past Medical History:  Diagnosis Date  . Duodenal obstruction   . GERD (gastroesophageal reflux disease)   . Hypertension      Past Medical History:  Diagnosis Date  . Duodenal obstruction   . GERD (gastroesophageal reflux disease)   . Hypertension      Past Surgical History:  Procedure Laterality Date  . ABDOMINAL HYSTERECTOMY    . BALLOON DILATION  12/31/2011   Procedure: BALLOON DILATION;  Surgeon: Barrie Folk, MD;  Location: [facility] ENDOSCOPY;  Service: Endoscopy;  Laterality: N/A;  . CHOLECYSTECTOMY  07/28/11  . ESOPHAGOGASTRODUODENOSCOPY  10/17/2011   Procedure: ESOPHAGOGASTRODUODENOSCOPY (EGD);  Surgeon: Barrie Folk, MD;  Location[facility] ENDOSCOPY;  Service: Endoscopy;  Laterality: N/A;  . IR  ANGIO INTRA EXTRACRAN SEL INTERNAL CAROTID BILAT MOD SED  04/07/2017  . IR ANGIO VERTEBRAL SEL VERTEBRAL UNI R MOD SED  04/07/2017  . IR ANGIOGRAM FOLLOW UP STUDY  04/07/2017  . IR ANGIOGRAM FOLLOW UP STUDY  04/07/2017  . IR ANGIOGRAM FOLLOW UP STUDY  04/07/2017  . IR ANGIOGRAM FOLLOW UP STUDY  04/07/2017  . IR ANGIOGRAM FOLLOW UP STUDY  04/07/2017  . IR ANGIOGRAM SELECTIVE EACH ADDITIONAL VESSEL  04/07/2017  . IR TRANSCATH/EMBOLIZ  04/07/2017  . PARS PLANA VITRECTOMY Right 05/01/2017   Procedure: PARS PLANA VITRECTOMY WITH 25 GAUGE RIGHT EYE, endolaser photocoaglation;  Surgeon: Carmela Rima, MD;  Location: [facility] OR;  Service: Ophthalmology;  Laterality: Right;  . RADIOLOGY WITH ANESTHESIA N/A 04/07/2017   Procedure: RADIOLOGY WITH ANESTHESIA;  Surgeon: Lisbeth Renshaw, MD;  Location: [facility] OR;  Service: Radiology;  Laterality: N/A;  . REPLACEMENT TOTAL KNEE BILATERAL  2004  . TEMPOROMANDIBULAR JOINT SURGERY     2 surgeries  . TUMOR REMOVAL    . VAGOTOMY  01/23/2012   Procedure: VAGOTOMY, antrectomy and BII;  Surgeon: Currie Paris, MD;  Location: [facility] OR;  Service: General;  Laterality: N/A;  Laparotomy with vagotomy.     Family History  Problem Relation Age of Onset  . Hypertension Mother   . Restless legs syndrome Mother   . Hyperlipidemia Mother   . Heart disease Father   . Malignant hyperthermia Neg  Hx      History  Drug Use No     History  Alcohol Use No     History  Smoking Status  . Former Smoker  . Packs/day: 1.00  . Years: 20.00  . Types: Cigarettes  . Quit date: 04/07/2017  Smokeless Tobacco  . Never Used     Outpatient Encounter Prescriptions as of 05/21/2017  Medication Sig  . [DISCONTINUED] ofloxacin (OCUFLOX) 0.3 % ophthalmic solution Place 1 drop into the right eye 4 (four) times daily. Use thorough June 6th  . [DISCONTINUED] pantoprazole (PROTONIX) 40 MG tablet Take 1 tablet (40 mg total) by mouth daily as needed.  . [DISCONTINUED] prednisoLONE acetate  (PRED FORTE) 1 % ophthalmic suspension Place 1 drop into the right eye 4 (four) times daily. Taper one drop less each week.  To complete drops at 4 weeks  . [DISCONTINUED] doxazosin (CARDURA) 2 MG tablet Take 1 tablet (2 mg total) by mouth at bedtime.  . [DISCONTINUED] pantoprazole (PROTONIX) 40 MG tablet Take 1 tablet (40 mg total) by mouth 2 (two) times daily before a meal.  . [DISCONTINUED] tobramycin-dexamethasone (TOBRADEX) ophthalmic ointment Place into the right eye 2 (two) times daily.  . [DISCONTINUED] topiramate (TOPAMAX) 50 MG tablet Take 1 tablet (50 mg total) by mouth 2 (two) times daily.   No facility-administered encounter medications on file as of 05/21/2017.     Allergies: Nsaids   Review of Systems  Constitutional: Negative for chills, diaphoresis, fever, malaise/fatigue and weight loss.  HENT: Negative for congestion and sore throat.   Eyes: Positive for blurred vision, double vision, pain, discharge and redness. Negative for photophobia.  Respiratory: Negative for cough, shortness of breath and wheezing.   Cardiovascular: Negative for chest pain, palpitations, orthopnea and PND.  Gastrointestinal: Positive for heartburn. Negative for blood in stool, diarrhea, nausea and vomiting.  Genitourinary: Negative for dysuria, frequency, hematuria and urgency.  Musculoskeletal: Positive for falls, joint pain and myalgias.  Skin: Negative for itching and rash.  Neurological: Positive for dizziness, tingling, sensory change, focal weakness, seizures, weakness and headaches.  Endo/Heme/Allergies: Negative for environmental allergies and polydipsia. Does not bruise/bleed easily.  Psychiatric/Behavioral: Positive for depression and memory loss. Negative for substance abuse and suicidal ideas. The patient is nervous/anxious. The patient does not have insomnia.      Objective:   Blood pressure (!) 146/83, pulse 80, height 5' 3.25" (1.607 m), weight 145 lb (65.8 kg), last menstrual  period 04/06/2017. Body mass index is 25.48 kg/m. General: Well Developed, well nourished, and in no acute distress.  Neuro: Alert and oriented, extra-ocular muscles intact HEENT: [place]/AT, neck supple, No carotid bruits Skin: no gross rashes  Cardiac: Regular rate and rhythm, S1,S2 Respiratory: Essentially clear to auscultation bilaterally. Not using accessory muscles, speaking in full sentences.  Abdominal: not grossly distended Musculoskeletal: Ambulates w/ diff- uses walker with assist Vasc: less 2 sec cap RF, warm and pink  Psych:  No HI/SI, judgement and insight good, depressed mood.  anxious Affect.

## 2017-05-21 NOTE — Patient Instructions (Addendum)
Please check your blood pressure a couple times a day and either write it down or save it on your monitor.  Bring this into our offcie in approximately 3 weeks for recheck.  In the meantime, I'll try to touch base with Dr. Domingo Cocking and Neurosurgeon regarding best blood pressure medicines for you in regards to your chronic dizziness and BP lability since your Neurological events,  if we need to start them in the future.  Please try to work on walking daily- using your walker to help build strength and for mental health and wellness      Guidelines for a Low Sodium Diet   Low Sodium Diet A main source of sodium is table salt. The average American eats five or more teaspoons of salt each day. This is about 20 times as much as the body needs. In fact, your body needs only 1/4 teaspoon of salt every day. Sodium is found naturally in foods, but a lot of it is added during processing and preparation. Many foods that do not taste salty may still be high in sodium. Large amounts of sodium can be hidden in canned, processed and convenience foods. And sodium can be found in many foods that are served at [employer].  Sodium controls fluid balance in our bodies and maintains blood volume and blood pressure. Eating too much sodium may raise blood pressure and cause fluid retention, which could lead to swelling of the legs and feet or other health issues.  When limiting sodium in your diet, a common target is to eat less than 2,000 milligrams of sodium per day.   General Guidelines for Cutting Down on Salt Eliminate salty foods from your diet and reduce the amount of salt used in cooking. Sea salt is no better than regular salt.  Choose low sodium foods. Many salt-free or reduced salt products are available. When reading food labels, low sodium is defined as 140 mg of sodium per serving.  Salt substitutes are sometimes made from potassium, so read the label. If you are on a low potassium  diet, then check with your doctor before using those salt substitutes.  Be creative and season your foods with spices, herbs, lemon, garlic, ginger, vinegar and pepper. Remove the salt shaker from the table.  Read ingredient labels to identify foods high in sodium. Items with 400 mg or more of sodium are high in sodium. High sodium food additives include salt, brine, or other items that say sodium, such as monosodium glutamate.  Eat more home-cooked meals. Foods cooked from scratch are naturally lower in sodium than most instant and boxed mixes.  Don't use softened water for cooking and drinking since it contains added salt.  Avoid medications which contain sodium such as Alka Tour manager.  For more information; food composition books are available which tell how much sodium is in food. Online sources such as www.calorieking.com also list amounts.     Meats, Poultry, Fish, Legumes, Eggs and Nuts  High-Sodium Foods: Smoked, cured, salted or canned meat, fish or poultry including bacon, cold cuts, ham, frankfurters, sausage, sardines, caviar and anchovies Frozen breaded meats and dinners, such as burritos and pizza Canned entrees, such as ravioli, spam and chili Salted nuts Beans canned with salt added  Low-Sodium Alternatives: Any fresh or frozen beef, lamb, pork, poultry and fish Eggs and egg substitutes Low-sodium peanut butter Dry peas and beans (not canned) Low-sodium canned fish Drained, water or oil packed canned fish or poultry  Dairy Products  High-Sodium Foods: Buttermilk Regular and processed cheese, cheese spreads and sauces Cottage cheese  Low-Sodium Alternatives: Milk, yogurt, ice cream and ice milk Low-sodium cheeses, cream cheese, ricotta cheese and mozzarella   Breads, Grains and Cereals  High-Sodium Foods: Bread and rolls with salted tops Quick breads, self-rising flour, biscuit, pancake and waffle mixes Pizza, croutons and salted  crackers Prepackaged, processed mixes for potatoes, rice, pasta and stuffing  Low-Sodium Alternatives: Breads, bagels and rolls without salted tops Muffins and most ready-to-eat cereals All rice and pasta, but do not to add salt when cooking Low-sodium corn and flour tortillas and noodles Low-sodium crackers and breadsticks Unsalted popcorn, chips and pretzels      Vegetables and Fruits  High-Sodium Foods: Regular canned vegetables and vegetable juices Olives, pickles, sauerkraut and other pickled vegetables Vegetables made with ham, bacon or salted pork Packaged mixes, such as scalloped or au gratin potatoes, frozen hash browns and Tater Tots Commercially prepared pasta and tomato sauces and salsa  Low-Sodium Alternatives: Fresh and frozen vegetables without sauces Low-sodium canned vegetables, sauces and juices Fresh potatoes, frozen JamaicaFrench fries and instant mashed potatoes Low-salt tomato or V-8 juice. Most fresh, frozen and canned fruit Dried fruits   Soups  High-Sodium Foods: Regular canned and dehydrated soup, broth and bouillon Cup of noodles and seasoned ramen mixes  Low-Sodium Alternatives: Low-sodium canned and dehydrated soups, broth and bouillon Homemade soups without added salt   Fats, Desserts and Sweets  High-Sodium Foods: Soy sauce, seasoning salt, other sauces and marinades Bottled salad dressings, regular salad dressing with bacon bits Salted butter or margarine Instant pudding and cake Large portions of ketchup, mustard  Low-Sodium Alternatives: Vinegar, unsalted butter or margarine Vegetable oils and low sodium sauces and salad dressings Mayonnaise All desserts made without salt     Hypertension  Hypertension, commonly called high blood pressure, is when the force of blood pumping through the arteries is too strong. The arteries are the blood vessels that carry blood from the heart throughout the body. Hypertension forces the heart  to work harder to pump blood and may cause arteries to become narrow or stiff. Having untreated or uncontrolled hypertension can cause heart attacks, strokes, kidney disease, and other problems. A blood pressure reading consists of a higher number over a lower number. Ideally, your blood pressure should be below 120/80. The first ("top") number is called the systolic pressure. It is a measure of the pressure in your arteries as your heart beats. The second ("bottom") number is called the diastolic pressure. It is a measure of the pressure in your arteries as the heart relaxes. What are the causes? The cause of this condition is not known. What increases the risk? Some risk factors for high blood pressure are under your control. Others are not. Factors you can change  Smoking.  Having type 2 diabetes mellitus, high cholesterol, or both.  Not getting enough exercise or physical activity.  Being overweight.  Having too much fat, sugar, calories, or salt (sodium) in your diet.  Drinking too much alcohol. Factors that are difficult or impossible to change  Having chronic kidney disease.  Having a family history of high blood pressure.  Age. Risk increases with age.  Race. You may be at higher risk if you are African-American.  Gender. Men are at higher risk than women before age 42. After age 42, women are at higher risk than men.  Having obstructive sleep apnea.  Stress. What are the signs or symptoms? Extremely  high blood pressure (hypertensive crisis) may cause:  Headache.  Anxiety.  Shortness of breath.  Nosebleed.  Nausea and vomiting.  Severe chest pain.  Jerky movements you cannot control (seizures).  How is this diagnosed? This condition is diagnosed by measuring your blood pressure while you are seated, with your arm resting on a surface. The cuff of the blood pressure monitor will be placed directly against the skin of your upper arm at the level of your  heart. It should be measured at least twice using the same arm. Certain conditions can cause a difference in blood pressure between your right and left arms. Certain factors can cause blood pressure readings to be lower or higher than normal (elevated) for a short period of time:  When your blood pressure is higher when you are in a health care provider's office than when you are at home, this is called white coat hypertension. Most people with this condition do not need medicines.  When your blood pressure is higher at home than when you are in a health care provider's office, this is called masked hypertension. Most people with this condition may need medicines to control blood pressure.  If you have a high blood pressure reading during one visit or you have normal blood pressure with other risk factors:  You may be asked to return on a different day to have your blood pressure checked again.  You may be asked to monitor your blood pressure at home for 1 week or longer.  If you are diagnosed with hypertension, you may have other blood or imaging tests to help your health care provider understand your overall risk for other conditions. How is this treated? This condition is treated by making healthy lifestyle changes, such as eating healthy foods, exercising more, and reducing your alcohol intake. Your health care provider may prescribe medicine if lifestyle changes are not enough to get your blood pressure under control, and if:  Your systolic blood pressure is above 130.  Your diastolic blood pressure is above 80.  Your personal target blood pressure may vary depending on your medical conditions, your age, and other factors. Follow these instructions at home: Eating and drinking  Eat a diet that is high in fiber and potassium, and low in sodium, added sugar, and fat. An example eating plan is called the DASH (Dietary Approaches to Stop Hypertension) diet. To eat this way: ? Eat plenty of  fresh fruits and vegetables. Try to fill half of your plate at each meal with fruits and vegetables. ? Eat whole grains, such as whole wheat pasta, brown rice, or whole grain bread. Fill about one quarter of your plate with whole grains. ? Eat or drink low-fat dairy products, such as skim milk or low-fat yogurt. ? Avoid fatty cuts of meat, processed or cured meats, and poultry with skin. Fill about one quarter of your plate with lean proteins, such as fish, chicken without skin, beans, eggs, and tofu. ? Avoid premade and processed foods. These tend to be higher in sodium, added sugar, and fat.  Reduce your daily sodium intake. Most people with hypertension should eat less than 1,500 mg of sodium a day.  Limit alcohol intake to no more than 1 drink a day for nonpregnant women and 2 drinks a day for men. One drink equals 12 oz of beer, 5 oz of wine, or 1 oz of hard liquor. Lifestyle  Work with your health care provider to maintain a healthy body weight or  to lose weight. Ask what an ideal weight is for you.  Get at least 30 minutes of exercise that causes your heart to beat faster (aerobic exercise) most days of the week. Activities may include walking, swimming, or biking.  Include exercise to strengthen your muscles (resistance exercise), such as pilates or lifting weights, as part of your weekly exercise routine. Try to do these types of exercises for 30 minutes at least 3 days a week.  Do not use any products that contain nicotine or tobacco, such as cigarettes and e-cigarettes. If you need help quitting, ask your health care provider.  Monitor your blood pressure at home as told by your health care provider.  Keep all follow-up visits as told by your health care provider. This is important. Medicines  Take over-the-counter and prescription medicines only as told by your health care provider. Follow directions carefully. Blood pressure medicines must be taken as prescribed.  Do not skip  doses of blood pressure medicine. Doing this puts you at risk for problems and can make the medicine less effective.  Ask your health care provider about side effects or reactions to medicines that you should watch for. Contact a health care provider if:  You think you are having a reaction to a medicine you are taking.  You have headaches that keep coming back (recurring).  You feel dizzy.  You have swelling in your ankles.  You have trouble with your vision. Get help right away if:  You develop a severe headache or confusion.  You have unusual weakness or numbness.  You feel faint.  You have severe pain in your chest or abdomen.  You vomit repeatedly.  You have trouble breathing. Summary  Hypertension is when the force of blood pumping through your arteries is too strong. If this condition is not controlled, it may put you at risk for serious complications.  Your personal target blood pressure may vary depending on your medical conditions, your age, and other factors. For most people, a normal blood pressure is less than 120/80.  Hypertension is treated with lifestyle changes, medicines, or a combination of both. Lifestyle changes include weight loss, eating a healthy, low-sodium diet, exercising more, and limiting alcohol. This information is not intended to replace advice given to you by your health care provider. Make sure you discuss any questions you have with your health care provider. Document Released: 11/26/2005 Document Revised: 10/24/2016 Document Reviewed: 10/24/2016 Elsevier Interactive Patient Education  [employer].

## 2017-05-22 LAB — COMPREHENSIVE METABOLIC PANEL
ALT: 18 IU/L (ref 0–32)
AST: 17 IU/L (ref 0–40)
Albumin/Globulin Ratio: 1.5 (ref 1.2–2.2)
Albumin: 4 g/dL (ref 3.5–5.5)
Alkaline Phosphatase: 103 IU/L (ref 39–117)
BUN/Creatinine Ratio: 12 (ref 9–23)
BUN: 8 mg/dL (ref 6–24)
Bilirubin Total: <0.2 mg/dL (ref 0.0–1.2)
CALCIUM: 9.4 mg/dL (ref 8.7–10.2)
CHLORIDE: 102 mmol/L (ref 96–106)
CO2: 23 mmol/L (ref 20–29)
Creatinine, Ser: 0.65 mg/dL (ref 0.57–1.00)
GFR, EST AFRICAN AMERICAN: 128 mL/min/{1.73_m2} (ref >59)
GFR, EST NON AFRICAN AMERICAN: 111 mL/min/{1.73_m2} (ref >59)
GLUCOSE: 80 mg/dL (ref 65–99)
Globulin, Total: 2.7 g/dL (ref 1.5–4.5)
Potassium: 4.5 mmol/L (ref 3.5–5.2)
Sodium: 143 mmol/L (ref 134–144)
TOTAL PROTEIN: 6.7 g/dL (ref 6.0–8.5)

## 2017-05-22 LAB — CBC WITH DIFFERENTIAL/PLATELET
BASOS ABS: 0 10*3/uL (ref 0.0–0.2)
Basos: 0 %
EOS (ABSOLUTE): 0.1 10*3/uL (ref 0.0–0.4)
Eos: 1 %
HEMOGLOBIN: 11 g/dL — AB (ref 11.1–15.9)
Hematocrit: 36.1 % (ref 34.0–46.6)
IMMATURE GRANS (ABS): 0 10*3/uL (ref 0.0–0.1)
IMMATURE GRANULOCYTES: 0 %
LYMPHS: 33 %
Lymphocytes Absolute: 2.5 10*3/uL (ref 0.7–3.1)
MCH: 24 pg — ABNORMAL LOW (ref 26.6–33.0)
MCHC: 30.5 g/dL — ABNORMAL LOW (ref 31.5–35.7)
MCV: 79 fL (ref 79–97)
MONOCYTES: 7 %
Monocytes Absolute: 0.6 10*3/uL (ref 0.1–0.9)
NEUTROS PCT: 59 %
Neutrophils Absolute: 4.6 10*3/uL (ref 1.4–7.0)
PLATELETS: 489 10*3/uL — AB (ref 150–379)
RBC: 4.58 x10E6/uL (ref 3.77–5.28)
RDW: 17.2 % — ABNORMAL HIGH (ref 12.3–15.4)
WBC: 7.8 10*3/uL (ref 3.4–10.8)

## 2017-05-22 LAB — T4, FREE: FREE T4: 0.96 ng/dL (ref 0.82–1.77)

## 2017-05-22 LAB — VITAMIN D 25 HYDROXY (VIT D DEFICIENCY, FRACTURES): VIT D 25 HYDROXY: 12.1 ng/mL — AB (ref 30.0–100.0)

## 2017-05-22 LAB — LIPID PANEL
CHOLESTEROL TOTAL: 213 mg/dL — AB (ref 100–199)
Chol/HDL Ratio: 2.9 ratio (ref 0.0–4.4)
HDL: 74 mg/dL (ref >39)
LDL Calculated: 111 mg/dL — ABNORMAL HIGH (ref 0–99)
TRIGLYCERIDES: 138 mg/dL (ref 0–149)
VLDL Cholesterol Cal: 28 mg/dL (ref 5–40)

## 2017-05-22 LAB — TSH: TSH: 0.619 u[IU]/mL (ref 0.450–4.500)

## 2017-05-22 LAB — HEMOGLOBIN A1C
Est. average glucose Bld gHb Est-mCnc: 108 mg/dL
Hgb A1c MFr Bld: 5.4 % (ref 4.8–5.6)

## 2017-05-22 LAB — VITAMIN B12: Vitamin B-12: 1563 pg/mL — ABNORMAL HIGH (ref 232–1245)

## 2017-05-22 LAB — PHOSPHORUS: Phosphorus: 4.1 mg/dL (ref 2.5–4.5)

## 2017-05-22 LAB — MAGNESIUM: MAGNESIUM: 1.9 mg/dL (ref 1.6–2.3)

## 2017-05-22 LAB — HEPATITIS C ANTIBODY

## 2017-05-22 LAB — HIV ANTIBODY (ROUTINE TESTING W REFLEX): HIV SCREEN 4TH GENERATION: NONREACTIVE

## 2017-05-24 ENCOUNTER — Telehealth: Payer: Self-pay

## 2017-05-24 NOTE — Telephone Encounter (Signed)
Patient husband called to follow up regarding patients blood pressure medication.  Patient still having trouble with dizziness.  They want to know what to do next.  Please advise.

## 2017-05-24 NOTE — Telephone Encounter (Signed)
Pt has had BP issues for some time now-  since her hospitalization and all.  She made it clear this is nothing new.   I gave her instructions below upon d/c from her OV.    "Please check your blood pressure a couple times a day and either write it down or save it on your monitor.  Bring this into our offcie in approximately 3 weeks for recheck."

## 2017-05-27 ENCOUNTER — Telehealth: Payer: Self-pay | Admitting: Family Medicine

## 2017-05-27 ENCOUNTER — Ambulatory Visit (HOSPITAL_BASED_OUTPATIENT_CLINIC_OR_DEPARTMENT_OTHER): Payer: BLUE CROSS/BLUE SHIELD | Admitting: Physical Medicine & Rehabilitation

## 2017-05-27 ENCOUNTER — Encounter: Payer: Self-pay | Admitting: Physical Medicine & Rehabilitation

## 2017-05-27 ENCOUNTER — Telehealth: Payer: Self-pay | Admitting: *Deleted

## 2017-05-27 ENCOUNTER — Other Ambulatory Visit: Payer: Self-pay | Admitting: Family Medicine

## 2017-05-27 VITALS — BP 164/93 | HR 89

## 2017-05-27 DIAGNOSIS — I69393 Ataxia following cerebral infarction: Secondary | ICD-10-CM

## 2017-05-27 DIAGNOSIS — R269 Unspecified abnormalities of gait and mobility: Secondary | ICD-10-CM | POA: Diagnosis not present

## 2017-05-27 DIAGNOSIS — I69398 Other sequelae of cerebral infarction: Secondary | ICD-10-CM

## 2017-05-27 DIAGNOSIS — I10 Essential (primary) hypertension: Secondary | ICD-10-CM

## 2017-05-27 DIAGNOSIS — I608 Other nontraumatic subarachnoid hemorrhage: Secondary | ICD-10-CM

## 2017-05-27 DIAGNOSIS — I69093 Ataxia following nontraumatic subarachnoid hemorrhage: Secondary | ICD-10-CM | POA: Diagnosis not present

## 2017-05-27 MED ORDER — AMLODIPINE BESYLATE 5 MG PO TABS: 5.0000 mg | ORAL_TABLET | Freq: Every day | ORAL | 0 refills | Status: DC

## 2017-05-27 NOTE — Telephone Encounter (Signed)
Spoke with Dr. Trecia RogersKirsten's today about patient's blood pressure.  He did not have any preference as to what blood pressure medicine would be preferred in the setting of subarachnoid hemorrhage and stroke as is her past medical history.  We'll go with amlodipine.  He told patient I will be calling in prescription.  Told him to please have patient check blood pressure at home and then have her follow-up in the office in the next 2-4 weeks.

## 2017-05-27 NOTE — Progress Notes (Signed)
Subjective:    Patient ID: [name] Barnett, female    DOB: 05/22/1976, 41 y.o.   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 HPI Right eye/vision doing better, now able to read at least larger print. Is able to read very large print with left eye. Still has some blurriness on the left side. Major concern today is blood pressure. She was seen in follow-up after rehabilitation hospitalization 05/14/2017 with major complaint of dizziness. Her systolic blood pressure at that time was running at 97. She had an appointment with PCP . 05/21/2017. Initial visit. Pt was asked to discontinue Cardura until that visit.  Patient still has dizziness. However, her blood pressure is on the rise and husband has been checking this at home, reporting readings around 170 over 90.  No falls or new trauma, continues to undergo outpatient physical therapy.  Pain Inventory Average Pain 0 Pain Right Now 0 My pain is heavy head/neck  In the last 24 hours, has pain interfered with the following? General activity 0 Relation with others 0 Enjoyment of life 0 What TIME of day is your pain at its worst? all Sleep (in general) Good  Pain is worse with: standing Pain improves with: rest Relief from Meds: na  Mobility walk without assistance use a walker ability to climb steps?  no do you drive?  no  Function disabled: date disabled .  Neuro/Psych numbness dizziness  Prior Studies Any changes since last visit?  no  Physicians involved in your care Any changes since last visit?  no   Family History  Problem Relation Age of Onset  . Hypertension Mother   . Restless legs syndrome Mother   . Malignant hyperthermia Neg Hx    Social History   Social History  . Marital status: Married    Spouse name: N/A  . Number of children: N/A  . Years of education: N/A    Social History Main Topics  . Smoking status: Former Smoker    Packs/day: 1.00    Years: 20.00    Types: Cigarettes    Quit date: 04/07/2017  . Smokeless tobacco: Never Used  . Alcohol use No  . Drug use: No  . Sexual activity: Not Currently    Partners: Male   Other Topics Concern  . None   Social History Narrative   [place] 2 [place]. 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   Past Surgical History:  Procedure Laterality Date  . BALLOON DILATION  12/31/2011   Procedure: BALLOON DILATION;  Surgeon: Barrie FolkJohn C Hayes, MD;  [facility];  Service: Endoscopy;  Laterality: N/A;  . CHOLECYSTECTOMY  07/28/11  . ESOPHAGOGASTRODUODENOSCOPY  10/17/2011   Procedure: ESOPHAGOGASTRODUODENOSCOPY (EGD);  Surgeon: Barrie FolkJohn C Hayes, MD;  [facility];  Service: Endoscopy;  Laterality: N/A;  . IR ANGIO INTRA EXTRACRAN SEL INTERNAL CAROTID BILAT MOD SED  04/07/2017  . IR ANGIO VERTEBRAL SEL VERTEBRAL UNI R MOD SED  04/07/2017  . IR ANGIOGRAM FOLLOW UP STUDY  04/07/2017  . IR ANGIOGRAM FOLLOW UP STUDY  04/07/2017  . IR ANGIOGRAM FOLLOW UP STUDY  04/07/2017  . IR ANGIOGRAM FOLLOW UP STUDY  04/07/2017  . IR ANGIOGRAM FOLLOW UP STUDY  04/07/2017  . IR ANGIOGRAM SELECTIVE EACH ADDITIONAL VESSEL  04/07/2017  . IR TRANSCATH/EMBOLIZ  04/07/2017  . PARS PLANA VITRECTOMY Right 05/01/2017   Procedure: PARS  PLANA VITRECTOMY WITH 25 GAUGE RIGHT EYE, endolaser photocoaglation;  Surgeon: Carmela Rima, MD;  Location: [facility] OR;  Service: Ophthalmology;  Laterality: Right;  . RADIOLOGY WITH ANESTHESIA N/A 04/07/2017   Procedure: RADIOLOGY WITH ANESTHESIA;  Surgeon: Lisbeth Renshaw, MD;  Location: [facility] OR;  Service: Radiology;  Laterality: N/A;  . TEMPOROMANDIBULAR JOINT SURGERY     2 surgeries  . VAGOTOMY  01/23/2012   Procedure: VAGOTOMY, antrectomy and BII;  Surgeon: Currie Paris, MD;  [facility];  Service: General;  Laterality: N/A;  Laparotomy with vagotomy.   Past Medical History:    Diagnosis Date  . Duodenal obstruction   . GERD (gastroesophageal reflux disease)   . Hypertension    BP (!) 164/93   Pulse 89   SpO2 95%   Opioid Risk Score:   Fall Risk Score:  `1  Depression screen PHQ 2/9  Depression screen PHQ 2/9 05/14/2017  Decreased Interest 3  Down, Depressed, Hopeless 2  PHQ - 2 Score 5  Altered sleeping 3  Tired, decreased energy 3  Change in appetite 1  Feeling bad or failure about yourself  1  Trouble concentrating 2  Moving slowly or fidgety/restless 2  Suicidal thoughts 0  PHQ-9 Score 17  Difficult doing work/chores Very difficult    Review of Systems  Constitutional: Negative.   HENT: Negative.   Eyes: Negative.   Respiratory: Negative.   Cardiovascular: Negative.   Gastrointestinal: Negative.   Endocrine: Negative.   Genitourinary: Negative.   Musculoskeletal: Negative.   Skin: Negative.   Allergic/Immunologic: Negative.   Neurological: Negative.   Hematological: Negative.   Psychiatric/Behavioral: Negative.   All other systems reviewed and are negative.      Objective:   Physical Exam  Constitutional: She appears well-developed and well-nourished.  HENT:  Head: Normocephalic and atraumatic.  Eyes: Conjunctivae and EOM are normal. Pupils are equal, round, and reactive to light.  Neck: Normal range of motion.  Cardiovascular: Normal rate, regular rhythm and normal heart sounds.   No murmur heard. Pulmonary/Chest: Effort normal and breath sounds normal. No respiratory distress. She has no wheezes.  Abdominal: Soft. Bowel sounds are normal. She exhibits no distension. There is no tenderness.  Neurological: She is alert.  Moderate dysmetria, left finger-nose-finger. Mild ptosis left eye. Decreased left pupil, adduction, superior gaze in for gaze, intact lateral gaze.  Psychiatric: She has a normal mood and affect.  Nursing note and vitals reviewed.         Assessment & Plan:  1. History of subarachnoid hemorrhage  with Terson's syndrome, left cranial nerve III palsy as well as left-sided dysmetria. Continue outpatient PT, OT  Physical medicine rehabilitation. Follow-up 4-6 weeks  2. Hypertension, blood pressures climbing since discontinuation of Cardura. However, her dizziness has persisted. As I explained to the patient and her husband, I do not think the dizziness. His blood pressure related, but related to her subarachnoid hemorrhage. She should have tighter management of her blood pressure and I have spoken to her new primary care physician today, Dr. Molli Knock will be calling in a new blood pressure medication. As discussed with patient and PCP, we'll defer any blood pressure management to primary care  Over half of the 25 min visit was spent counseling and coordinating care.

## 2017-05-27 NOTE — Telephone Encounter (Signed)
PT's husband called states wife told Dr. Sharee Holsterpalski to send Rx to [facility] on [address]. --glh

## 2017-05-27 NOTE — Telephone Encounter (Signed)
Amlodipine resent to [facility].  [facility] and patient aware.

## 2017-05-27 NOTE — Telephone Encounter (Signed)
I sent a note to Dr. Sharee Holsterpalski who is the PCP managing blood pressure. Blood pressure can be normalized at this point without concern of hypoperfusion. I asked for the PCP office to contact the patient

## 2017-05-27 NOTE — Telephone Encounter (Signed)
Mr Alyse LowKallum called and says that his wife is still having issues with her BP.  It is still elevated (176/90).  He says she is not taking anything currently because Dr Wynn BankerKirsteins stopped her meds.  She did see her primary but the issue is not resolved and Mr Neldon LabellaKallam feels no one is doing anything.  Her upcoming appt is 06/25/17. He feels she needs to be seen this week.  Dr Wynn BankerKirsteins has an appt and Misty StanleyLisa will call him back and give them the time. ( she was to monitor BP and go back to see Dr Sharee Holsterpalski in 3 weeks)

## 2017-05-27 NOTE — Telephone Encounter (Signed)
Advised patient husband to monitor patient blood pressure.  He says that his machine was not working well.  Advised him to purchase another, take patient to a pharmacy or make appointment to have blood pressure checked at the office.  He will look into a new machine.  Patient husband will call if any other issues before follow up appointment.

## 2017-05-27 NOTE — Telephone Encounter (Signed)
Tried to contact patient regarding medications.

## 2017-05-29 ENCOUNTER — Encounter: Payer: Self-pay | Admitting: Family Medicine

## 2017-05-29 ENCOUNTER — Other Ambulatory Visit: Payer: Self-pay | Admitting: Family Medicine

## 2017-05-29 ENCOUNTER — Telehealth: Payer: Self-pay | Admitting: *Deleted

## 2017-05-29 DIAGNOSIS — E559 Vitamin D deficiency, unspecified: Secondary | ICD-10-CM | POA: Insufficient documentation

## 2017-05-29 MED ORDER — VITAMIN D (ERGOCALCIFEROL) 1.25 MG (50000 UNIT) PO CAPS: 50000.0000 [IU] | ORAL_CAPSULE | ORAL | 10 refills | Status: DC

## 2017-05-29 NOTE — Telephone Encounter (Signed)
Trey PaulaJeff, PT, [facility] left a message asking for verbal orders for HHPT 2week4 to address gait. Balance, strength training, and fall prevention.  Returned phone call and gave verbal orders per office protocol

## 2017-05-30 ENCOUNTER — Telehealth: Payer: Self-pay

## 2017-05-30 DIAGNOSIS — R7989 Other specified abnormal findings of blood chemistry: Secondary | ICD-10-CM | POA: Insufficient documentation

## 2017-05-30 DIAGNOSIS — R748 Abnormal levels of other serum enzymes: Secondary | ICD-10-CM | POA: Insufficient documentation

## 2017-05-30 NOTE — Telephone Encounter (Signed)
Patient's husband called states that patient start on amlodipine 5mg  1/2 tablet daily for the 1st week.  Husband is concerned states that blood pressure is still high - running >160/90. He would like to know if she needs to go up to a whole tablet please advise.  MPulliam

## 2017-05-30 NOTE — Telephone Encounter (Signed)
Nurse with [employer] called on behalf of patient to verify medications.

## 2017-05-31 DIAGNOSIS — H3563 Retinal hemorrhage, bilateral: Secondary | ICD-10-CM | POA: Diagnosis not present

## 2017-05-31 DIAGNOSIS — R2689 Other abnormalities of gait and mobility: Secondary | ICD-10-CM | POA: Diagnosis not present

## 2017-05-31 DIAGNOSIS — F17210 Nicotine dependence, cigarettes, uncomplicated: Secondary | ICD-10-CM | POA: Diagnosis not present

## 2017-05-31 DIAGNOSIS — H539 Unspecified visual disturbance: Secondary | ICD-10-CM | POA: Diagnosis not present

## 2017-05-31 DIAGNOSIS — I10 Essential (primary) hypertension: Secondary | ICD-10-CM | POA: Diagnosis not present

## 2017-05-31 DIAGNOSIS — I69019 Unspecified symptoms and signs involving cognitive functions following nontraumatic subarachnoid hemorrhage: Secondary | ICD-10-CM | POA: Diagnosis not present

## 2017-05-31 DIAGNOSIS — K219 Gastro-esophageal reflux disease without esophagitis: Secondary | ICD-10-CM | POA: Diagnosis not present

## 2017-05-31 DIAGNOSIS — W19XXXD Unspecified fall, subsequent encounter: Secondary | ICD-10-CM | POA: Diagnosis not present

## 2017-05-31 DIAGNOSIS — H4901 Third [oculomotor] nerve palsy, right eye: Secondary | ICD-10-CM | POA: Diagnosis not present

## 2017-05-31 DIAGNOSIS — I69098 Other sequelae following nontraumatic subarachnoid hemorrhage: Secondary | ICD-10-CM | POA: Diagnosis not present

## 2017-05-31 DIAGNOSIS — S060X1D Concussion with loss of consciousness of 30 minutes or less, subsequent encounter: Secondary | ICD-10-CM | POA: Diagnosis not present

## 2017-05-31 DIAGNOSIS — Z7901 Long term (current) use of anticoagulants: Secondary | ICD-10-CM | POA: Diagnosis not present

## 2017-05-31 NOTE — Telephone Encounter (Signed)
Called and spoke to the patient and informed her to take 1 whole tablet daily.  Patient expressed understanding and will monitor BP readings at home.  Patient states that she is feeling fine denies any symptoms at this time if bp worsens or symptoms begin over the week patient will seek care at the hospital.   MPulliam, CMA

## 2017-05-31 NOTE — Telephone Encounter (Signed)
Yes please have her take 1 full tablet daily.  Continue to monitor your blood pressure and keep a log.  Write it down.  Please reassure her husband that it is not good for blood pressure to immediately come down.  We want to slowly do it over time so that blood pressure is okay as long as she is not having any symptoms of chest pain, shortness of breath etc.

## 2017-06-07 ENCOUNTER — Inpatient Hospital Stay: Payer: BLUE CROSS/BLUE SHIELD | Admitting: Physical Medicine & Rehabilitation

## 2017-06-13 ENCOUNTER — Encounter: Payer: Self-pay | Admitting: Family Medicine

## 2017-06-13 ENCOUNTER — Telehealth: Payer: Self-pay | Admitting: Family Medicine

## 2017-06-13 ENCOUNTER — Ambulatory Visit (INDEPENDENT_AMBULATORY_CARE_PROVIDER_SITE_OTHER): Payer: BLUE CROSS/BLUE SHIELD | Admitting: Family Medicine

## 2017-06-13 ENCOUNTER — Other Ambulatory Visit: Payer: Self-pay

## 2017-06-13 VITALS — BP 137/82 | HR 94 | Ht 63.25 in | Wt 149.0 lb

## 2017-06-13 DIAGNOSIS — R748 Abnormal levels of other serum enzymes: Secondary | ICD-10-CM | POA: Diagnosis not present

## 2017-06-13 DIAGNOSIS — Z87891 Personal history of nicotine dependence: Secondary | ICD-10-CM

## 2017-06-13 DIAGNOSIS — E7800 Pure hypercholesterolemia, unspecified: Secondary | ICD-10-CM | POA: Diagnosis not present

## 2017-06-13 DIAGNOSIS — I608 Other nontraumatic subarachnoid hemorrhage: Secondary | ICD-10-CM | POA: Diagnosis not present

## 2017-06-13 DIAGNOSIS — I639 Cerebral infarction, unspecified: Secondary | ICD-10-CM | POA: Diagnosis not present

## 2017-06-13 DIAGNOSIS — I725 Aneurysm of other precerebral arteries: Secondary | ICD-10-CM | POA: Diagnosis not present

## 2017-06-13 DIAGNOSIS — I10 Essential (primary) hypertension: Secondary | ICD-10-CM | POA: Diagnosis not present

## 2017-06-13 DIAGNOSIS — E559 Vitamin D deficiency, unspecified: Secondary | ICD-10-CM

## 2017-06-13 MED ORDER — VALSARTAN 40 MG PO TABS: 40.0000 mg | ORAL_TABLET | Freq: Every day | ORAL | 1 refills | Status: DC

## 2017-06-13 NOTE — Telephone Encounter (Signed)
Resent RX into [facility] on [address] - called patient to notify no answer unable to leave message. MPulliam, CMA/RT(R)

## 2017-06-13 NOTE — Telephone Encounter (Signed)
Pt's husband called states Patient told MA that they use [facility] [address], [employer] [facility] pay [facility]--Please contact [facility] and have Rx transferred to  [facility] [address]. --glh

## 2017-06-13 NOTE — Patient Instructions (Addendum)
[email or web address]  Take  only a half a tablet of your new blood pressure medicine Valsartan along with the amlodipine.  Take them together daily.  Monitor your blood pressure-.  It should not make her dizziness worse.  If your blood pressure is still not at goal of less than 140/90 on a regular basis then take the full tablet after 10 days.    I will follow you up and see you in another 3-4 weeks and see how you're doing.   Please ask your specialists if vestibular rehabilitation might help your dizziness since your stroke.    . What is Chronic Stress Syndrome, Symptoms & Ways to Deal With it   What is Chronic Stress Syndrome?  Chronic Stress Syndrome is something which can now be called as a medical condition due to the amount of stress an individual is going through these days. Chronic Stress Syndrome causes the body and mind to shutdown and the person has no control over himself or herself. Due to the demands of modern day life and the hardship throughout day and night takes its toll over a period of time and the body and brain starts demanding rest and a break. This leads to certain symptoms where your performance level starts to dip at work, you become irritable both at work and at home, you may stop enjoying activities you previously liked, you may become depressed, you may get angry for even small things. Chronic Stress Syndrome can significantly impact your quality life. Thus it is important understand the symptoms of Chronic Stress Syndrome and react accordingly in order to cope up with it.  It is important to note here that a balanced work-home equation should be drawn to cut down symptoms of Chronic Stress Syndrome. Minor stressors can be overcome by the body's inbuilt stress response but when there is unending stress for a long period of time then an external help is required to ease the  stress.  Chronic Stress Syndrome can physically and psychologically drain you over a period of time. For such cases stress management is the best way to cope up with Chronic Stress Syndrome. If Chronic Stress Syndrome is not treated then it may result in many health hazards like anxiety, muscle pain, insomnia, and high blood pressure along with a compromised immune system leading to frequent infections and missed days from work.    What are the Symptoms of Chronic Stress Syndrome?   The symptoms of Chronic Stress Syndrome are variable and range from generalized symptoms to emotional symptoms along with behavioral and cognitive symptoms. Some of these symptoms have been delineated below:  Generalized Symptoms of Chronic Stress Syndrome are: Anxiety Depression Social isolation Headache Abdominal pain Lack of sleep Back pain Difficulty in concentrating Hypertension Hemorrhoids Varicose veins Panic attacks/ Panic disorder Cardiovascular diseases.   Some of the Emotional Symptoms of Chronic Stress Syndrome are: To become easily agitated, moody and frustrated Feeling overwhelmed which makes you feel like you are losing control. Having difficulty relaxing and have a peaceful mind Having low self esteem Feeling lonely Feeling worthless Feeling depressed Avoiding social environment.   Some of the Physical Symptoms of Chronic Stress Syndrome are: Headaches Lethargy Alternating diarrhea and constipation Nausea Muscles aches and pains Insomnia Rapid heartbeat and chest pain Infections and frequent colds Decreased libido Nervousness and shaking Tinnitus Sweaty palms Dry mouth Clenched jaw.  Some of the Cognitive Symptoms of Chronic Stress Syndrome are: Constant worrying Racing thoughts Disorganization and forgetfulness Inability to focus Poor judgment  Abundance of negativity.  Some of the Behavioral Symptoms of Chronic Stress Syndrome are: Changes in appetite with  less desire to eat Avoiding responsibilities Indulgence in alcohol or recreational drug use Increased nail biting and being fidgety Ways to Deal With Chronic Stress Syndrome    Chronic Stress Syndrome is not something which cannot be addressed. A bit of effort from your side in the form of lifestyle modifications, a little bit of exercise, a balanced work life equation can do wonders and help you get rid of Chronic Stress Syndrome.  Get Proper Sleep: It has been proved that Chronic Stress Syndrome causes loss of sleep where an individual may not even be able to sleep for days unending. This may result in the individual feeling lethargic and unable to focus at work the following morning. This may lead to decreased performance at work. Thus, it is important to have a good sleep-wake cycle. For this, try and not drink any caffeinated beverage about four hours prior to going to sleep, as caffeine pumps up the adrenaline and causes you to stay awake resulting ultimately in Chronic Stress Syndrome.  Avoid Alcohol and Drugs: Another way to get rid of Chronic Stress Syndrome is lifestyle modifications. Stay away from alcohol and other recreational drugs. Take Short Frequent Breaks at Work: Try to take frequent breaks from work and do not work continuously. Try and manage your work in such a way that you even meet your deadline and come home on time for a happy dinner with family. A good time spent with family and kids does wonders in not only dealing with Chronic Stress Syndrome but also preventing it.  Become Physically Active: Another step towards getting rid of Chronic Stress Syndrome is physical activity. If you do not have time to spend at the gym then at least try and go for daily walks for about half an hour a day which not only keeps the stress away but also is good for your overall health. Physical activity leads to production of endorphins which will make you feel relaxed and feel good.  Healthy  Diet Can Help You Deal With Chronic Stress Syndrome: Have a balanced and healthy diet is another step towards a stress free life and keeping Chronic Stress Syndrome at bay. If time is a constraint then you can try eating three small meals a day. Try and avoid fast foods and take foods which are healthy and rich in proteins, fiber, and carbohydrates to boost your energy system.  Music Can Soothe Your Mind: Light music is one of the best and most effective relaxation techniques that one can try to overcome stress. It has shown to calm down the mind and take you away from all the stressors that you may be having. These days it is also being used as a therapy in some institutes for overcoming stress. It is important here to discuss the importance of a good social support system for patients with Chronic Stress Syndrome, as a good social support framework can do wonders in taking the stress away from the patient and overcoming Chronic Stress Syn ask your and no surgeon and/or the [facility] doctor about this tubular rehabilitation for your dizziness and asked them if that might help you.  drome.  Meditation Can Help You Deal With Chronic Stress Syndrome Effectively: Meditation and yoga has also shown to be quite effective in relaxing the mind and coping up with Chronic Stress Syndrome   In cases where these measures are not helpful,  then it is time for you to consult with a skilled psychologist or a psychiatrist for potential therapies or medications to control the stress response.   The psychologist can help you with a variety of steps for coping up with Chronic Stress Syndrome. Relaxation techniques and behavioral therapy are some of the methods employed by psychologists. In some cases, medications can also be given to help relax the patient.  Since Chronic Stress Syndrome is both emotionally and physically draining for the patient and it also adversely affects the family life of the patient hence it is important  for the patient to recognize the condition and taking steps to cope up with it. Escaping measures like alcohol and drug use are of no help as they only aggravate the condition apart from their other health hazards. If this condition is ignored or left untreated it can lead to various medical conditions like anxiety and depression and various other medical conditions.  Last but not least, smile as often as you can as it is the best gift that you can give to someone. The best way to stay relaxed is to have a good smile, exercise daily, spend time with your family, meditation and if required consultation with a good psychologist so that you can live a stress free life and overcome the symptoms of Chronic Stress Syndrome.

## 2017-06-13 NOTE — Progress Notes (Signed)
Impression and Recommendations:    1. HTN, goal below 130/80   2. Elevated vitamin B12 level   3. Elevated LDL cholesterol level   4. Cerebrovascular accident (CVA), unspecified mechanism ([name])   5. Benign essential HTN   6. Vitamin D deficiency   7. Basilar artery aneurysm ([name])   8. History of tobacco abuse-  30pk yr hx - quit 04/07/17   9. 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 - We discussed intensive dietary and lifestyle changes to treat this elevation in LDL. - Importance of continuing smoking cessation discussed with patient. - Importance of controlling blood pressure also discussed  CVA (cerebral vascular accident) [facility]) - Patient will continue to follow up with her neurologist\neurosurgeon as well as her [facility] doctor. - She will continue home therapy. - Recommend she asked them if they feel vestibular rehabilitation would be of benefit at all to her.  Benign essential HTN - Continue Norvasc - Start valsartan.  One half tablet daily.   - Monitor home blood pressure.  It should not make her dizziness worse- if it does let me know.    - I told pt if blood pressure is still not at goal of less than 140/90 on a regular basis then take the full tablet after 10 days.     - I will see you in another 3-4 weeks and see how you're doing.  - Did also briefly discussed with patient the possibility of having autonomic dysfunction of her nervous system as being the reason why her blood pressure so labile and up and down.  Encouraged her to speak with her stroke specialist about this and see if they feel that might be a issue.  Advised patient to get further recommendations from the specialist regarding  this  Vitamin D deficiency -->  Pt's Vitamin D level is too low. -  Discussed importance of vitamin D (as well as calcium)  to their health and well-being.  - We reviewed possible symptoms of low Vitamin D including low energy, muscle aches, joint aches, even neurological sx etc.   - Weekly and daily prescriptions discussed with patient.  See med list. - Repeat level in 6-12 months  Elevated vitamin B12 level Total patient to please check and see what she is taking for supplements and back down 1/3 of current amnt on the B12.    Education and routine counseling performed. Handouts provided. No orders of the defined types were placed in this encounter.    Return for Follow-up blood pressure, new medicine, 3-4 weeks.  The patient was counseled, risk factors were discussed, anticipatory guidance given.  Gross side effects, risk and benefits, and alternatives of medications discussed with patient.  Patient is aware that all medications have potential side effects and we are unable to predict every side effect or drug-drug interaction that may occur.  Expresses verbal understanding and consents to current therapy plan and treatment regimen.  (Patient brought home BP monitor into the office today.  We checked her machine and compared to the readings on both of the machines in our office. The BP monitors in office where both reading same as to what is documented in her vitals.  Patient's home machine is  reading high with a reading that was elevated from the reading done on our monitors before and after the reading on her machine.  Patient was advised to check the manual to see if her's can be calibrated or to contact the place that she got the machine and see if they can get a new one since they have just gotten the monitor. - per CMA)  Please see AVS handed out to patient at the end of our visit for further patient instructions/ counseling done pertaining to today's office visit.    Note:  This document was prepared using Dragon voice recognition software and may include unintentional dictation errors.     Subjective:    Chief Complaint  Patient presents with  . Follow-up    HPI: [name] Barnett is a 41 y.o. female who presents to [facility] today for follow up for HTN, and to review recent labs   H/o Tob abuse:    Still not smoking.  No desire to   Dizziness:    Patiently saw but patient saw her neurosurgeon about 1 week ago or so for follow-up of her dizziness.  He assessed her for this and gave her Antivert-  has not been helping her since.   She has a follow-up with him in about a month and a half.   She is frustrated with these sx and wants answers.  I encouraged patient to follow-up with them sooner.   HTN:  -  Since patient was last seen by me on 6\12\18.  Patient went and had a follow-up with Dr. Illa Level her PM&R doctor on [phone].  He subsequently got in touch with me, and had concerns about patient's blood pressure in his office and at her home.  I started patient on low-dose amlodipine which we called in for her on 6\18\18.    -  Her blood pressure has not been controlled at home.  Blood pressures at home seem to be "all over the place".  Some days it is well-controlled other days it is high.   180s / 101, then hours later it will be 147/82.    Also one time and she was sitting on the couch she had a blood pressure of 176/102 with a heart rate of 124. Doing nothing but sitting.  No NEW sx with these.  - Patient reports good compliance with blood pressure medications  - Denies medication S-E   - Smoking Status noted   - She denies new onset of sx differing from prior.  She is tolerating the blood pressure medicines well.  Today their BP is BP: 137/82      Filed Weights   06/13/17 0957  Weight: 149 lb (67.6 kg)      Patient Care Team    Relationship Specialty Notifications Start End  Thomasene Lot, DO PCP -  General Family Medicine  04/30/17   Charlott Rakes, MD Consulting Physician Gastroenterology  10/17/11   Lisbeth Renshaw, MD Consulting Physician Neurosurgery  05/21/17   Erick Colace, MD Consulting Physician Physical Medicine and Rehabilitation  05/21/17   Olivia Mackie, MD Consulting Physician Obstetrics and Gynecology  05/21/17   Carmela Rima, MD Consulting Physician Ophthalmology  06/13/17    Comment: Neuro ophthalmologist     Lab Results  Component Value Date   CREATININE 0.65 05/21/2017   BUN 8 05/21/2017   NA 143 05/21/2017   K 4.5 05/21/2017   CL 102 05/21/2017   CO2 23  05/21/2017    Lab Results  Component Value Date   CHOL 213 (H) 05/21/2017    Lab Results  Component Value Date   HDL 74 05/21/2017    Lab Results  Component Value Date   LDLCALC 111 (H) 05/21/2017    Lab Results  Component Value Date   TRIG 138 05/21/2017    Lab Results  Component Value Date   CHOLHDL 2.9 05/21/2017    No results found for: LDLDIRECT ===================================================================   Patient Active Problem List   Diagnosis Date Noted  . Neuritis-  R sided:  arm and leg/ body due to stroke 08/05/2017    Priority: High  . Elevated LDL cholesterol level 07/11/2017    Priority: High  . History of tobacco abuse-  30pk yr hx - quit 04/07/17 05/21/2017    Priority: High  . Benign essential HTN     Priority: High  . Basilar artery aneurysm ([name])     Priority: High  . 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 CVA (cerebral vascular accident) ([name]) 04/07/2017    Priority: High  . Vitamin D deficiency 05/29/2017    Priority: Medium  . Adjustment disorder with mixed anxiety and depressed mood     Priority: Medium  . 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 Gait disturbance, post-stroke 05/14/2017    Priority: Low  . Vitreous hemorrhage of right eye ([name])      Priority: Low  . s/p SAH (subarachnoid hemorrhage) ([name]) 04/19/2017    Priority: Low  . Duodenal ulcer, acute with obstruction 10/17/2011    Priority: Low  . Weakness with dizziness-  since [facility] and CVA 04/07/17 08/07/2017  . Disturbances of vision, late effect of stroke 08/06/2017  . Health education/counseling 08/05/2017  . High risk medications (not anticoagulants) long-term use 08/05/2017  . [employer] (NIH) Stroke Scale limb ataxia score 2, ataxia present in two limbs 06/25/2017  . Elevated vitamin B12 level 05/30/2017  . 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 Hypoxia   . Elevated gastrin level 01/25/2012  . Hypokalemia 01/21/2012  . Nausea & vomiting 01/20/2012  . Epigastric pain 01/20/2012  . S/P laparoscopic cholecystectomy 10/16/2011     Past Medical History:  Diagnosis Date  . Duodenal obstruction   . GERD (gastroesophageal reflux disease)   . Hypertension      Past Surgical History:  Procedure Laterality Date  . ABDOMINAL HYSTERECTOMY    . BALLOON DILATION  12/31/2011   Procedure: BALLOON DILATION;  Surgeon: Barrie Folk, MD;  Location: [facility] ENDOSCOPY;  Service: Endoscopy;  Laterality: N/A;  . CHOLECYSTECTOMY  07/28/11  . ESOPHAGOGASTRODUODENOSCOPY  10/17/2011   Procedure: ESOPHAGOGASTRODUODENOSCOPY (EGD);  Surgeon: Barrie Folk, MD;  Location: [facility] ENDOSCOPY;  Service: Endoscopy;  Laterality: N/A;  . IR ANGIO INTRA EXTRACRAN SEL INTERNAL CAROTID BILAT MOD SED  04/07/2017  . IR ANGIO VERTEBRAL SEL VERTEBRAL UNI R MOD SED  04/07/2017  . IR ANGIOGRAM FOLLOW UP STUDY  04/07/2017  . IR ANGIOGRAM FOLLOW UP STUDY  04/07/2017  . IR ANGIOGRAM FOLLOW UP STUDY  04/07/2017  . IR ANGIOGRAM FOLLOW UP STUDY  04/07/2017  . IR ANGIOGRAM FOLLOW UP STUDY  04/07/2017  . IR ANGIOGRAM SELECTIVE EACH ADDITIONAL VESSEL  04/07/2017  . IR TRANSCATH/EMBOLIZ  04/07/2017  . PARS PLANA VITRECTOMY Right 05/01/2017  Procedure: PARS PLANA VITRECTOMY WITH 25 GAUGE RIGHT EYE, endolaser photocoaglation;  Surgeon: Carmela Rima, MD;  Location: [facility] OR;  Service: Ophthalmology;  Laterality: Right;  . RADIOLOGY WITH ANESTHESIA N/A 04/07/2017   Procedure: RADIOLOGY WITH ANESTHESIA;  Surgeon: Lisbeth Renshaw, MD;  Location: [facility] OR;  Service: Radiology;  Laterality: N/A;  . REPLACEMENT TOTAL KNEE BILATERAL  2004  . TEMPOROMANDIBULAR JOINT SURGERY     2 surgeries  . TUMOR REMOVAL    . VAGOTOMY  01/23/2012   Procedure: VAGOTOMY, antrectomy and BII;  Surgeon: Currie Paris, MD;  Location: [facility] OR;  Service: General;  Laterality: N/A;  Laparotomy with vagotomy.     Family History  Problem Relation Age of Onset  . Hypertension Mother   . Restless legs syndrome Mother   . Hyperlipidemia Mother   . Heart disease Father   . Malignant hyperthermia Neg Hx      History  Drug Use No  ,  History  Alcohol Use No  ,  History  Smoking Status  . Former Smoker  . Packs/day: 1.00  . Years: 20.00  . Types: Cigarettes  . Quit date: 04/07/2017  Smokeless Tobacco  . Never Used  ,    Current Outpatient Prescriptions on File Prior to Visit  Medication Sig Dispense Refill  . Vitamin D, Ergocalciferol, (DRISDOL) 50000 units CAPS capsule Take 1 capsule (50,000 Units total) by mouth every 7 (seven) days. 12 capsule 10   No current facility-administered medications on file prior to visit.      Allergies  Allergen Reactions  . Nsaids Other (See Comments)    Pt diagnosed with near-obstructing circumferential ulcer of duodenum [id number]     Review of Systems:   General:  Denies fever, chills Optho/Auditory:   Denies visual changes, blurred vision Respiratory:   Denies SOB, cough, wheeze, DIB  Cardiovascular:   Denies chest pain, palpitations, painful respirations Gastrointestinal:   Denies nausea, vomiting, diarrhea.  Endocrine:     Denies new hot or cold intolerance Musculoskeletal:  Denies joint swelling,  gait issues, or new unexplained myalgias/ arthralgias Skin:  Denies rash, suspicious lesions  Neurological:    Denies dizziness, unexplained weakness, numbness  Psychiatric/Behavioral:   Denies mood changes  Objective:    Blood pressure 137/82, pulse 94, height 5' 3.25" (1.607 m), weight 149 lb (67.6 kg), last menstrual period 05/22/2017.  Body mass index is 26.19 kg/m.  General: Well Developed, well nourished HEENT: Normocephalic, atraumatic, neck supple, No carotid bruits, no JVD Skin: Warm and dry, cap RF less 2 sec Cardiac: Regular rate and rhythm, S1, S2 WNL's, no murmurs rubs or gallops Respiratory: ECTA B/L, Not using accessory muscles, speaking in full sentences. NeuroM-Sk: Ambulates w/ assistance Ext: scant edema b/l lower ext Psych: No HI/SI, judgement and insight good, Euthymic mood. Full Affect.   Recent Results (from the past 2160 hour(s))  CBC with Differential/Platelet     Status: Abnormal   Collection Time: 05/21/17 10:39 AM  Result Value Ref Range   WBC 7.8 3.4 - 10.8 x10E3/uL   RBC 4.58 3.77 - 5.28 x10E6/uL   Hemoglobin 11.0 (L) 11.1 - 15.9 g/dL   Hematocrit 84.1 32.4 - 46.6 %   MCV 79 79 - 97 fL   MCH 24.0 (L) 26.6 - 33.0 pg   MCHC 30.5 (L) 31.5 - 35.7 g/dL   RDW 40.1 (H) 02.7 - 25.3 %   Platelets 489 (H) 150 - 379 x10E3/uL   Neutrophils 59 Not Estab. %   Lymphs  33 Not Estab. %   Monocytes 7 Not Estab. %   Eos 1 Not Estab. %   Basos 0 Not Estab. %   Neutrophils Absolute 4.6 1.4 - 7.0 x10E3/uL   Lymphocytes Absolute 2.5 0.7 - 3.1 x10E3/uL   Monocytes Absolute 0.6 0.1 - 0.9 x10E3/uL   EOS (ABSOLUTE) 0.1 0.0 - 0.4 x10E3/uL   Basophils Absolute 0.0 0.0 - 0.2 x10E3/uL   Immature Granulocytes 0 Not Estab. %   Immature Grans (Abs) 0.0 0.0 - 0.1 x10E3/uL  Comprehensive metabolic panel     Status: None   Collection Time: 05/21/17 10:39 AM  Result Value Ref Range   Glucose 80 65 - 99 mg/dL   BUN 8 6 - 24 mg/dL   Creatinine, Ser 1.610.65 0.57 - 1.00 mg/dL    GFR calc non Af Amer 111 >59 mL/min/1.73   GFR calc Af Amer 128 >59 mL/min/1.73   BUN/Creatinine Ratio 12 9 - 23   Sodium 143 134 - 144 mmol/L   Potassium 4.5 3.5 - 5.2 mmol/L   Chloride 102 96 - 106 mmol/L   CO2 23 20 - 29 mmol/L    Comment:               **Please note reference interval change**   Calcium 9.4 8.7 - 10.2 mg/dL   Total Protein 6.7 6.0 - 8.5 g/dL   Albumin 4.0 3.5 - 5.5 g/dL   Globulin, Total 2.7 1.5 - 4.5 g/dL   Albumin/Globulin Ratio 1.5 1.2 - 2.2   Bilirubin Total <0.2 0.0 - 1.2 mg/dL   Alkaline Phosphatase 103 39 - 117 IU/L   AST 17 0 - 40 IU/L   ALT 18 0 - 32 IU/L  Hemoglobin A1c     Status: None   Collection Time: 05/21/17 10:39 AM  Result Value Ref Range   Hgb A1c MFr Bld 5.4 4.8 - 5.6 %    Comment:          Pre-diabetes: 5.7 - 6.4          Diabetes: >6.4          Glycemic control for adults with diabetes: <7.0    Est. average glucose Bld gHb Est-mCnc 108 mg/dL  HIV antibody     Status: None   Collection Time: 05/21/17 10:39 AM  Result Value Ref Range   HIV Screen 4th Generation wRfx Non Reactive Non Reactive  Hepatitis C antibody     Status: None   Collection Time: 05/21/17 10:39 AM  Result Value Ref Range   Hep C Virus Ab <0.1 0.0 - 0.9 s/co ratio    Comment:                                   Negative:     < 0.8                              Indeterminate: 0.8 - 0.9                                   Positive:     > 0.9  The CDC recommends that a positive HCV antibody result  be followed up with a HCV Nucleic Acid Amplification  test ([id number]).   Lipid panel     Status: Abnormal  Collection Time: 05/21/17 10:39 AM  Result Value Ref Range   Cholesterol, Total 213 (H) 100 - 199 mg/dL   Triglycerides 161 0 - 149 mg/dL   HDL 74 >09 mg/dL   VLDL Cholesterol Cal 28 5 - 40 mg/dL   LDL Calculated 604 (H) 0 - 99 mg/dL   Chol/HDL Ratio 2.9 0.0 - 4.4 ratio    Comment:                                   T. Chol/HDL Ratio                                              Men  Women                               1/2 Avg.Risk  3.4    3.3                                   Avg.Risk  5.0    4.4                                2X Avg.Risk  9.6    7.1                                3X Avg.Risk 23.4   11.0   VITAMIN D 25 Hydroxy (Vit-D Deficiency, Fractures)     Status: Abnormal   Collection Time: 05/21/17 10:39 AM  Result Value Ref Range   Vit D, 25-Hydroxy 12.1 (L) 30.0 - 100.0 ng/mL    Comment: Vitamin D deficiency has been defined by the Institute of Medicine and an Endocrine Society practice guideline as a level of serum 25-OH vitamin D less than 20 ng/mL (1,2). The Endocrine Society went on to further define vitamin D insufficiency as a level between 21 and 29 ng/mL (2). 1. IOM (Institute of Medicine). 2010. Dietary reference    intakes for calcium and D. [place]: The    [employer]. 2. Holick MF, Binkley [place], Bischoff-Ferrari HA, et al.    Evaluation, treatment, and prevention of vitamin D    deficiency: an Endocrine Society clinical practice    guideline. JCEM. 2011 Jul; 96(7):1911-30.   Vitamin B12     Status: Abnormal   Collection Time: 05/21/17 10:39 AM  Result Value Ref Range   Vitamin B-12 1,563 (H) 232 - 1,245 pg/mL  TSH     Status: None   Collection Time: 05/21/17 10:39 AM  Result Value Ref Range   TSH 0.619 0.450 - 4.500 uIU/mL  T4, free     Status: None   Collection Time: 05/21/17 10:39 AM  Result Value Ref Range   Free T4 0.96 0.82 - 1.77 ng/dL  Magnesium     Status: None   Collection Time: 05/21/17 10:39 AM  Result Value Ref Range   Magnesium 1.9 1.6 - 2.3 mg/dL  Phosphorus     Status: None   Collection Time: 05/21/17 10:39 AM  Result Value Ref Range  Phosphorus 4.1 2.5 - 4.5 mg/dL

## 2017-06-25 ENCOUNTER — Encounter: Payer: Self-pay | Admitting: Physical Medicine & Rehabilitation

## 2017-06-25 ENCOUNTER — Ambulatory Visit (HOSPITAL_BASED_OUTPATIENT_CLINIC_OR_DEPARTMENT_OTHER): Payer: BLUE CROSS/BLUE SHIELD | Admitting: Physical Medicine & Rehabilitation

## 2017-06-25 ENCOUNTER — Encounter: Payer: BLUE CROSS/BLUE SHIELD | Attending: Physical Medicine & Rehabilitation

## 2017-06-25 VITALS — BP 140/80 | HR 109 | Resp 14

## 2017-06-25 DIAGNOSIS — I69393 Ataxia following cerebral infarction: Secondary | ICD-10-CM | POA: Diagnosis not present

## 2017-06-25 DIAGNOSIS — I69398 Other sequelae of cerebral infarction: Secondary | ICD-10-CM

## 2017-06-25 DIAGNOSIS — R209 Unspecified disturbances of skin sensation: Secondary | ICD-10-CM | POA: Diagnosis not present

## 2017-06-25 DIAGNOSIS — I725 Aneurysm of other precerebral arteries: Secondary | ICD-10-CM | POA: Diagnosis present

## 2017-06-25 DIAGNOSIS — I69093 Ataxia following nontraumatic subarachnoid hemorrhage: Secondary | ICD-10-CM | POA: Insufficient documentation

## 2017-06-25 DIAGNOSIS — R269 Unspecified abnormalities of gait and mobility: Secondary | ICD-10-CM

## 2017-06-25 DIAGNOSIS — I608 Other nontraumatic subarachnoid hemorrhage: Secondary | ICD-10-CM | POA: Diagnosis not present

## 2017-06-25 DIAGNOSIS — H4313 Vitreous hemorrhage, bilateral: Secondary | ICD-10-CM | POA: Diagnosis not present

## 2017-06-25 DIAGNOSIS — Z789 Other specified health status: Secondary | ICD-10-CM | POA: Diagnosis not present

## 2017-06-25 DIAGNOSIS — R42 Dizziness and giddiness: Secondary | ICD-10-CM | POA: Insufficient documentation

## 2017-06-25 NOTE — Patient Instructions (Signed)
No Driving  Agree with Neuro Optho evaluation

## 2017-06-25 NOTE — Progress Notes (Signed)
Subjective:    Patient ID: [name] Barnett, female    DOB: 01/14/1976, 41 y.o.   MRN: [id number]  HPI [facility] are finishing up OT, PT   Not falling at home Dizziness when head and neck are unsupported Always feels like she is falling to Right side No room spinning symptoms  No swallowing issues No problems with memory or concentration reported by pt or husband  BP now under control  In addition, patient has problems with sensing, temperature on the right side, this includes her arm, leg and face area.  No focal volume changes.  Overall, she can see much better out of both eyes.    Pain Inventory Average Pain 1 Pain Right Now 1 My pain is intermittent, sharp, burning, tingling and aching  In the last 24 hours, has pain interfered with the following? General activity 3 Relation with others 3 Enjoyment of life 3 What TIME of day is your pain at its worst? evening Sleep (in general) Good  Pain is worse with: standing Pain improves with: therapy/exercise Relief from Meds: n/a  Mobility walk with assistance use a walker how many minutes can you walk? 3 ability to climb steps?  no do you drive?  no needs help with transfers  Function disabled: date disabled . I need assistance with the following:  dressing, bathing, toileting and household duties  Neuro/Psych weakness numbness trouble walking dizziness loss of taste or smell  Prior Studies Any changes since last visit?  no  Physicians involved in your care Any changes since last visit?  no   Family History  Problem Relation Age of Onset  . Hypertension Mother   . Restless legs syndrome Mother   . Hyperlipidemia Mother   . Heart disease Father   . Malignant hyperthermia Neg Hx    Social History   Social History  . Marital status: Married    Spouse name: N/A  . Number of children: N/A  . Years of education: N/A   Social History Main Topics  . Smoking status: Former Smoker   Packs/day: 1.00    Years: 20.00    Types: Cigarettes    Quit date: 04/07/2017  . Smokeless tobacco: Never Used  . Alcohol use No  . Drug use: No  . Sexual activity: Not Currently    Partners: Male   Other Topics Concern  . None   Social History Narrative   [place] 2 kids. 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   Past Surgical History:  Procedure Laterality Date  . ABDOMINAL HYSTERECTOMY    . BALLOON DILATION  12/31/2011   Procedure: BALLOON DILATION;  Surgeon: Barrie FolkJohn C Hayes, MD;  [facility];  Service: Endoscopy;  Laterality: N/A;  . CHOLECYSTECTOMY  07/28/11  . ESOPHAGOGASTRODUODENOSCOPY  10/17/2011   Procedure: ESOPHAGOGASTRODUODENOSCOPY (EGD);  Surgeon: Barrie FolkJohn C Hayes, MD;  [facility];  Service: Endoscopy;  Laterality: N/A;  . IR ANGIO INTRA EXTRACRAN SEL INTERNAL CAROTID BILAT MOD SED  04/07/2017  . IR ANGIO VERTEBRAL SEL VERTEBRAL UNI R MOD SED  04/07/2017  . IR ANGIOGRAM FOLLOW UP STUDY  04/07/2017  . IR ANGIOGRAM FOLLOW UP STUDY  04/07/2017  . IR ANGIOGRAM FOLLOW UP STUDY  04/07/2017  . IR ANGIOGRAM FOLLOW UP STUDY  04/07/2017  . IR ANGIOGRAM FOLLOW UP STUDY  04/07/2017  . IR ANGIOGRAM SELECTIVE EACH ADDITIONAL VESSEL  04/07/2017  . IR TRANSCATH/EMBOLIZ  04/07/2017  . PARS PLANA VITRECTOMY Right 05/01/2017  Procedure: PARS PLANA VITRECTOMY WITH 25 GAUGE RIGHT EYE, endolaser photocoaglation;  Surgeon: Carmela Rima, MD;  Location: [facility] OR;  Service: Ophthalmology;  Laterality: Right;  . RADIOLOGY WITH ANESTHESIA N/A 04/07/2017   Procedure: RADIOLOGY WITH ANESTHESIA;  Surgeon: Lisbeth Renshaw, MD;  Location: [facility] OR;  Service: Radiology;  Laterality: N/A;  . REPLACEMENT TOTAL KNEE BILATERAL  2004  . TEMPOROMANDIBULAR JOINT SURGERY     2 surgeries  . TUMOR REMOVAL    . VAGOTOMY  01/23/2012   Procedure: VAGOTOMY, antrectomy and BII;  Surgeon: Currie Paris, MD;  Location: [facility] OR;  Service: General;  Laterality: N/A;  Laparotomy with vagotomy.    Past Medical History:  Diagnosis Date  . Duodenal obstruction   . GERD (gastroesophageal reflux disease)   . Hypertension    BP 140/80   Pulse (!) 109   Resp 14   SpO2 96%   Opioid Risk Score:   Fall Risk Score:  `1  Depression screen PHQ 2/9  Depression screen PHQ 2/9 05/14/2017  Decreased Interest 3  Down, Depressed, Hopeless 2  PHQ - 2 Score 5  Altered sleeping 3  Tired, decreased energy 3  Change in appetite 1  Feeling bad or failure about yourself  1  Trouble concentrating 2  Moving slowly or fidgety/restless 2  Suicidal thoughts 0  PHQ-9 Score 17  Difficult doing work/chores Very difficult    Review of Systems  HENT: Negative.   Eyes: Negative.   Respiratory: Negative.   Cardiovascular: Negative.   Gastrointestinal: Negative.   Endocrine: Negative.   Genitourinary: Negative.   Musculoskeletal: Positive for arthralgias and gait problem.  Allergic/Immunologic: Negative.   Neurological: Positive for dizziness, weakness and numbness.  Hematological: Negative.   Psychiatric/Behavioral: Negative.   All other systems reviewed and are negative.      Objective:   Physical Exam  Constitutional: She is oriented to person, place, and time. She appears well-developed and well-nourished.  HENT:  Head: Normocephalic and atraumatic.  Eyes: Pupils are equal, round, and reactive to light. Conjunctivae and EOM are normal.  Neurological: She is alert and oriented to person, place, and time.  Psychiatric: She has a normal mood and affect.  Nursing note and vitals reviewed.   Decreased sensation for light touch and temperature on the right side in the right arm and leg, also numbness right side of the face.  Dysmetria. Moderately severe left finger-nose-finger, mild-to-moderate left heel-to-shin.  No dysmetria on the right side.  Patient has vertical diplopia with straight ahead gaze. This corrects with lateral gaze. This also corrects with one eye closed. Ptosis  on the left has improved.  Motor strength is 4/5 in the left deltoid, bicep, tricep, grip, hip flexor, knee extensor, ankle dorsal flexor 5/5 in the right deltoid by stress, grip, hip flexion, extension, ankle dorsal flexor. Ambulates with rolling walker. Tends to lean towards the right side. She has limited ataxia noted. Left lower extremity, some stiff legged gait on the right side.       Assessment & Plan:  1. Left cerebellar infarct with left hemiataxia. This explains much of her gait problem, but does not explain the right lateral lean.  This also would not explain right hemisensory deficits  I suspect she may have had another brainstem infarct in the medullary area. However, it would likely take an MRI did demonstrate this. I'm not sure whether her embolization would allow MRI scanning. Have called neurosurgery office, await call back.  2. Visual dysfunction post stroke.  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 She is worried that she may be as good as she is going to get. Encouraged her to work hard in outpatient PT, OT, referrals made. May benefit from neuropsychology evaluation for adjustment  Over half of the 25 min visit was spent counseling and coordinating care.

## 2017-07-11 ENCOUNTER — Encounter: Payer: Self-pay | Admitting: Family Medicine

## 2017-07-11 ENCOUNTER — Ambulatory Visit (INDEPENDENT_AMBULATORY_CARE_PROVIDER_SITE_OTHER): Payer: BLUE CROSS/BLUE SHIELD | Admitting: Family Medicine

## 2017-07-11 VITALS — BP 130/83 | HR 120 | Ht 63.25 in | Wt 152.0 lb

## 2017-07-11 DIAGNOSIS — E7800 Pure hypercholesterolemia, unspecified: Secondary | ICD-10-CM | POA: Diagnosis not present

## 2017-07-11 DIAGNOSIS — Z87891 Personal history of nicotine dependence: Secondary | ICD-10-CM | POA: Diagnosis not present

## 2017-07-11 DIAGNOSIS — I639 Cerebral infarction, unspecified: Secondary | ICD-10-CM

## 2017-07-11 DIAGNOSIS — E559 Vitamin D deficiency, unspecified: Secondary | ICD-10-CM

## 2017-07-11 DIAGNOSIS — I69319 Unspecified symptoms and signs involving cognitive functions following cerebral infarction: Secondary | ICD-10-CM | POA: Diagnosis not present

## 2017-07-11 DIAGNOSIS — F4323 Adjustment disorder with mixed anxiety and depressed mood: Secondary | ICD-10-CM

## 2017-07-11 DIAGNOSIS — R269 Unspecified abnormalities of gait and mobility: Secondary | ICD-10-CM | POA: Diagnosis not present

## 2017-07-11 DIAGNOSIS — I69398 Other sequelae of cerebral infarction: Secondary | ICD-10-CM

## 2017-07-11 DIAGNOSIS — E785 Hyperlipidemia, unspecified: Secondary | ICD-10-CM | POA: Insufficient documentation

## 2017-07-11 DIAGNOSIS — I10 Essential (primary) hypertension: Secondary | ICD-10-CM | POA: Diagnosis not present

## 2017-07-11 MED ORDER — VITAMIN D3 125 MCG (5000 UT) PO TABS: ORAL_TABLET | ORAL | 3 refills | Status: DC

## 2017-07-11 MED ORDER — VALSARTAN 40 MG PO TABS: 20.0000 mg | ORAL_TABLET | Freq: Every day | ORAL | 1 refills | Status: DC

## 2017-07-11 MED ORDER — FLUOXETINE HCL 20 MG PO CAPS: 20.0000 mg | ORAL_CAPSULE | Freq: Every day | ORAL | 0 refills | Status: DC

## 2017-07-11 MED ORDER — ATORVASTATIN CALCIUM 20 MG PO TABS: 20.0000 mg | ORAL_TABLET | Freq: Every day | ORAL | 3 refills | Status: DC

## 2017-07-11 NOTE — Patient Instructions (Signed)
Follow-up in about 3 weeks to see how you're doing on the new cholesterol med and fluoxetine.  If you have any problems or concerns please immediately contact the office.  Consider counseling  gratefulness journal

## 2017-07-11 NOTE — Progress Notes (Signed)
Impression and Recommendations:    1. Benign essential HTN   2. Cerebrovascular accident (CVA), unspecified mechanism ([name])   3. History of tobacco abuse-  30pk yr hx - quit 04/07/17   4. Elevated LDL cholesterol level   5. Vitamin D deficiency   6. Adjustment disorder with mixed anxiety and depressed mood   7. Gait disturbance, post-stroke   8. 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 - Continue current treatment regiment/ med doses as appears to be well controlled  CVA (cerebral vascular accident) [facility]([name]) Follow with Dr Conchita ParisNundkumar ( N-sx)  and Dr Clementeen HoofKiersten's ( PM&R) per their recommendations  Elevated LDL cholesterol level - Start Lipitor after long discussion regarding risks benefits of the medicine. - Dietary and lifestyle modifications discussed especially drinking more water a daily basis - Discussed continued tobacco cessation - Recheck ALT  6 wks  History of tobacco abuse-  30pk yr hx - quit 04/07/17 Cont abstinence   Adjustment disorder with mixed anxiety and depressed mood - start prozac - Long discussion had with husband [name] Barnett and wife regarding risks benefits of Prozac and medicines that are like it. - Encouraged seeing a counselor  Vitamin D deficiency Continue supplementation.  We'll recheck in 6 months or so    Education and routine counseling performed. Handouts provided.  Pt was in the office today for 40+ minutes, with over 50% time spent in face to face counseling of patient and her husband regarding her emotions, frustrations and various medical conditions- BP and chol, treatment plans of those medical conditions including medicine management and lifestyle modification were discussed in detail, strategies to improve health and well being; and in coordination of care. SEE ABOVE FOR DETAILS   New Prescriptions   ATORVASTATIN (LIPITOR) 20 MG TABLET    Take 1 tablet (20 mg total)  by mouth at bedtime.   CHOLECALCIFEROL (VITAMIN D3) 5000 UNITS TABS    5,000 IU OTC vitamin D3 daily.   FLUOXETINE (PROZAC) 20 MG CAPSULE    Take 2 capsules (40 mg total) by mouth daily.        Discontinued Medications   No medications on file    Return for 3 wks.  f/up lipitor and prozac - new meds.  The patient was counseled, risk factors were discussed, anticipatory guidance given.  Gross side effects, risk and benefits, and alternatives of medications discussed with patient.  Patient is aware that all medications have potential side effects and we are unable to predict every side effect or drug-drug interaction that may occur.  Expresses verbal understanding and consents to current therapy plan and treatment regimen.  Patient brought home BP monitor into the office today.  We checked her machine and compared to the readings on both of the machines in our office. The BP monitors in office where both reading same as to what is documented in her vitals.  Patient's home machine is reading high with a reading that was elevated from the reading done on our monitors before and after the reading on her machine.  Patient was advised to check the manual to see if her's can be calibrated or to contact the place that she got the machine and see if they can get a new one since they have just gotten the monitor. - per cma  Please see AVS handed out to patient at the end of our visit for further patient instructions/  counseling done pertaining to today's office visit.    Note: This document was prepared using Dragon voice recognition software and may include unintentional dictation errors.     Subjective:    Chief Complaint  Patient presents with  . Follow-up    HPI: [name] Barnett is a 41 y.o. female who presents to [facility] today for follow up for HTN- Last office visit we started valsartan and in addition to her amlodipine.  Also started her on vitamin D  supplementation.    .   Mood:  - Patient tearful in the office today.  She is frustrated by her chronic dizziness symptoms and not being able to do anything.  She is also frustrated by the neuropathic symptoms she is having as well.  She is having a hard time being stuck with a walker and not progressing quicker.   She has had several many moments of tearfulness during the day and has not been able to control this very well.  She fears that she has nothing to offer her husband [name] Barnett anymore as well as her family- 80 year old daughter.  She worries about her well-being and a Barnett of the house responsibilities falls under shoulders now.     Elevated ldl: - We briefly discussed although patient's 10 year risk is less than 1%, due to her heavy history of smoking, family history and medical history, she would like to go on cholesterol medications.   Dizziness:    No change in her sx. Not W with BP meds  Vitamin D:  - Tolerating the new medicines without any side effect.  On once weekly as well as daily supplementation.  Does not feel any different.  HTN: -Patient is tolerating the half a tablet of valsartan well.  Blood pressures at home have been 115-130\ 70s.  No increase in her dizziness symptoms.  Feeling well.  Walking on treadmill- or so daily.  Still doing PT   Today their BP is BP: 130/83   Last 3 blood pressure readings in our office are as follows: BP Readings from Last 3 Encounters:  08/06/17 101/67  08/05/17 108/78  08/01/17 116/76    Pulse Readings from Last 3 Encounters:  08/06/17 87  08/01/17 93  07/15/17 77    Filed Weights   07/11/17 1359  Weight: 152 lb (68.9 kg)      Patient Care Team    Relationship Specialty Notifications Start End  [name] Lot, DO PCP - General Family Medicine  04/30/17   [name] Rakes, MD Consulting Physician Gastroenterology  10/17/11   [name] Renshaw, MD Consulting Physician Neurosurgery  05/21/17   [name] Colace, MD Consulting Physician Physical Medicine and Rehabilitation  05/21/17   [name] Mackie, MD Consulting Physician Obstetrics and Gynecology  05/21/17   [name] Barnett Rima, MD Consulting Physician Ophthalmology  06/13/17    Comment: Neuro ophthalmologist     Lab Results  Component Value Date   CREATININE 0.65 05/21/2017   BUN 8 05/21/2017   NA 143 05/21/2017   K 4.5 05/21/2017   CL 102 05/21/2017   CO2 23 05/21/2017    Lab Results  Component Value Date   CHOL 213 (H) 05/21/2017    Lab Results  Component Value Date   HDL 74 05/21/2017    Lab Results  Component Value Date   LDLCALC 111 (H) 05/21/2017    Lab Results  Component Value Date   TRIG 138 05/21/2017    Lab  Results  Component Value Date   CHOLHDL 2.9 05/21/2017    No results found for: LDLDIRECT ===================================================================   Patient Active Problem List   Diagnosis Date Noted  . Neuritis-  R sided:  arm and leg/ body due to stroke 08/05/2017    Priority: High  . Elevated LDL cholesterol level 07/11/2017    Priority: High  . History of tobacco abuse-  30pk yr hx - quit 04/07/17 05/21/2017    Priority: High  . Benign essential HTN     Priority: High  . Basilar artery aneurysm ([name])     Priority: High  . 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 CVA (cerebral vascular accident) ([name]) 04/07/2017    Priority: High  . Vitamin D deficiency 05/29/2017    Priority: Medium  . Adjustment disorder with mixed anxiety and depressed mood     Priority: Medium  . 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 Gait disturbance, post-stroke 05/14/2017    Priority: Low  . Vitreous hemorrhage of right eye ([name])     Priority: Low  . s/p SAH (subarachnoid hemorrhage) ([name]) 04/19/2017    Priority: Low  . Duodenal ulcer, acute with obstruction 10/17/2011    Priority: Low  . Weakness with dizziness-   since [facility] and CVA 04/07/17 08/07/2017  . Disturbances of vision, late effect of stroke 08/06/2017  . Health education/counseling 08/05/2017  . High risk medications (not anticoagulants) long-term use 08/05/2017  . [employer] (NIH) Stroke Scale limb ataxia score 2, ataxia present in two limbs 06/25/2017  . Elevated vitamin B12 level 05/30/2017  . 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 Hypoxia   . Elevated gastrin level 01/25/2012  . Hypokalemia 01/21/2012  . Nausea & vomiting 01/20/2012  . Epigastric pain 01/20/2012  . S/P laparoscopic cholecystectomy 10/16/2011     Past Medical History:  Diagnosis Date  . Duodenal obstruction   . GERD (gastroesophageal reflux disease)   . Hypertension      Past Surgical History:  Procedure Laterality Date  . ABDOMINAL HYSTERECTOMY    . BALLOON DILATION  12/31/2011   Procedure: BALLOON DILATION;  Surgeon: Barrie Folk, MD;  Location[facility] END[facility]COPY;  Service: Endoscopy;  Laterality: N/A;  . CHOLECYSTECTOMY  07/28/11  . ESOPHAGOGASTRODUODENOSCOPY  10/17/2011   Procedure: ESOPHAGOGASTRODUODENOSCOPY (EGD);  Surgeon: Barrie Folk, MD;  Location: [facility] ENDOSCOPY;  Service: Endoscopy;  Laterality: N/A;  . IR ANGIO INTRA EXTRACRAN SEL INTERNAL CAROTID BILAT MOD SED  04/07/2017  . IR ANGIO VERTEBRAL SEL VERTEBRAL UNI R MOD SED  04/07/2017  . IR ANGIOGRAM FOLLOW UP STUDY  04/07/2017  . IR ANGIOGRAM FOLLOW UP STUDY  04/07/2017  . IR ANGIOGRAM FOLLOW UP STUDY  04/07/2017  . IR ANGIOGRAM FOLLOW UP STUDY  04/07/2017  . IR ANGIOGRAM FOLLOW UP STUDY  04/07/2017  . IR ANGIOGRAM SELECTIVE EACH ADDITIONAL VESSEL  04/07/2017  . IR TRANSCATH/EMBOLIZ  04/07/2017  . PARS PLANA VITRECTOMY Right 05/01/2017   Procedure: PARS PLANA VITRECTOMY WITH 25 GAUGE RIGHT EYE, endolaser photocoaglation;  Surgeon: [name] Barnett Rima, MD;  Location[facility] OR;  Service: Ophthalmology;  Laterality: Right;  . RADIOLOGY  WITH ANESTHESIA N/A 04/07/2017   Procedure: RADIOLOGY WITH ANESTHESIA;  Surgeon: [name] Renshaw, MD;  Location: [facility] OR;  Service: Radiology;  Laterality: N/A;  . REPLACEMENT TOTAL KNEE BILATERAL  2004  . TEMPOROMANDIBULAR JOINT SURGERY     2 surgeries  . TUMOR REMOVAL    . VAGOTOMY  01/23/2012   Procedure: VAGOTOMY, antrectomy and BII;  Surgeon: [name], MD;  Location: [facility] OR;  Service: General;  Laterality: N/A;  Laparotomy with vagotomy.     Family History  Problem Relation Age of Onset  . Hypertension Mother   . Restless legs syndrome Mother   . Hyperlipidemia Mother   . Heart disease Father   . Malignant hyperthermia Neg Hx      History  Drug Use No  ,  History  Alcohol Use No  ,  History  Smoking Status  . Former Smoker  . Packs/day: 1.00  . Years: 20.00  . Types: Cigarettes  . Quit date: 04/07/2017  Smokeless Tobacco  . Never Used  ,    Current Outpatient Prescriptions on File Prior to Visit  Medication Sig Dispense Refill  . Vitamin D, Ergocalciferol, (DRISDOL) 50000 units CAPS capsule Take 1 capsule (50,000 Units total) by mouth every 7 (seven) days. 12 capsule 10   No current facility-administered medications on file prior to visit.      Allergies  Allergen Reactions  . 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  Endocrine:     Denies new hot or cold intolerance Musculoskeletal:  Denies joint swelling, gait issues, or new unexplained myalgias/ arthralgias Skin:  Denies rash, suspicious lesions  Neurological:    Denies New symptoms of dizziness, unexplained weakness, numbness    Psychiatric/Behavioral:   Denies mood changes  Objective:    Blood pressure 130/83, pulse (!) 120, height 5' 3.25" (1.607 m), weight 152 lb (68.9 kg), last menstrual period 06/20/2017.  Body mass index is 26.71 kg/m.  General: Well Developed, well nourished, and in no acute distress.  HEENT: Normocephalic, atraumatic, pupils equal round reactive to light, neck supple, No carotid bruits, no JVD Skin: Warm and dry, cap RF less 2 sec Cardiac: Regular rate and rhythm, S1, S2 WNL's, no murmurs rubs or gallops Respiratory: ECTA B/L, Not using accessory muscles, speaking in full sentences. NeuroM-Sk: Ambulates w/ assistance Ext: scant edema b/l lower ext Psych: No HI/SI, judgement and insight good, Depressed mood.  Flattened Affect.

## 2017-07-15 ENCOUNTER — Encounter: Payer: Self-pay | Admitting: Physical Therapy

## 2017-07-15 ENCOUNTER — Ambulatory Visit: Payer: BLUE CROSS/BLUE SHIELD | Attending: Physical Medicine & Rehabilitation | Admitting: Physical Therapy

## 2017-07-15 ENCOUNTER — Ambulatory Visit: Payer: BLUE CROSS/BLUE SHIELD | Admitting: Occupational Therapy

## 2017-07-15 VITALS — BP 128/87 | HR 77

## 2017-07-15 DIAGNOSIS — R278 Other lack of coordination: Secondary | ICD-10-CM | POA: Diagnosis present

## 2017-07-15 DIAGNOSIS — R41842 Visuospatial deficit: Secondary | ICD-10-CM

## 2017-07-15 DIAGNOSIS — R414 Neurologic neglect syndrome: Secondary | ICD-10-CM

## 2017-07-15 DIAGNOSIS — R42 Dizziness and giddiness: Secondary | ICD-10-CM | POA: Diagnosis present

## 2017-07-15 DIAGNOSIS — R208 Other disturbances of skin sensation: Secondary | ICD-10-CM

## 2017-07-15 DIAGNOSIS — R2681 Unsteadiness on feet: Secondary | ICD-10-CM | POA: Insufficient documentation

## 2017-07-15 DIAGNOSIS — I69054 Hemiplegia and hemiparesis following nontraumatic subarachnoid hemorrhage affecting left non-dominant side: Secondary | ICD-10-CM | POA: Diagnosis present

## 2017-07-15 DIAGNOSIS — R2689 Other abnormalities of gait and mobility: Secondary | ICD-10-CM | POA: Insufficient documentation

## 2017-07-15 DIAGNOSIS — R41840 Attention and concentration deficit: Secondary | ICD-10-CM | POA: Insufficient documentation

## 2017-07-15 DIAGNOSIS — R270 Ataxia, unspecified: Secondary | ICD-10-CM

## 2017-07-15 DIAGNOSIS — I69018 Other symptoms and signs involving cognitive functions following nontraumatic subarachnoid hemorrhage: Secondary | ICD-10-CM

## 2017-07-15 DIAGNOSIS — R260 Ataxic gait: Secondary | ICD-10-CM | POA: Diagnosis not present

## 2017-07-16 NOTE — Therapy (Signed)
[facility] [address] [place], [place], [address] Phone: [phone]   Fax:  [phone]  Occupational Therapy Evaluation  Patient Details  Name: [name] Barnett MRN: [id number] Date of Birth: [birth year] Referring Provider: Dr. Claudette Laws  Encounter Date: 07/15/2017      OT End of Session - 07/15/17 2115    Visit Number 1   Number of Visits 17   Date for OT Re-Evaluation 09/13/17   Authorization Type [employer], no visit limit/auth req.   OT Start Time 1405   OT Stop Time 1448   OT Time Calculation (min) 43 min   Activity Tolerance Patient tolerated treatment well   Behavior During Therapy Impulsive;WFL for tasks assessed/performed      Past Medical History:  Diagnosis Date  . Duodenal obstruction   . GERD (gastroesophageal reflux disease)   . Hypertension     Past Surgical History:  Procedure Laterality Date  . ABDOMINAL HYSTERECTOMY    . BALLOON DILATION  12/31/2011   Procedure: BALLOON DILATION;  Surgeon: Barrie Folk, MD;  Location: [facility] ENDOSCOPY;  Service: Endoscopy;  Laterality: N/A;  . CHOLECYSTECTOMY  07/28/11  . ESOPHAGOGASTRODUODENOSCOPY  10/17/2011   Procedure: ESOPHAGOGASTRODUODENOSCOPY (EGD);  Surgeon: Barrie Folk, MD;  Location: [facility] ENDOSCOPY;  Service: Endoscopy;  Laterality: N/A;  . IR ANGIO INTRA EXTRACRAN SEL INTERNAL CAROTID BILAT MOD SED  04/07/2017  . IR ANGIO VERTEBRAL SEL VERTEBRAL UNI R MOD SED  04/07/2017  . IR ANGIOGRAM FOLLOW UP STUDY  04/07/2017  . IR ANGIOGRAM FOLLOW UP STUDY  04/07/2017  . IR ANGIOGRAM FOLLOW UP STUDY  04/07/2017  . IR ANGIOGRAM FOLLOW UP STUDY  04/07/2017  . IR ANGIOGRAM FOLLOW UP STUDY  04/07/2017  . IR ANGIOGRAM SELECTIVE EACH ADDITIONAL VESSEL  04/07/2017  . IR TRANSCATH/EMBOLIZ  04/07/2017  . PARS PLANA VITRECTOMY Right 05/01/2017   Procedure: PARS PLANA VITRECTOMY WITH 25 GAUGE RIGHT EYE, endolaser photocoaglation;  Surgeon: Carmela Rima, MD;  Location: [facility] OR;   Service: Ophthalmology;  Laterality: Right;  . RADIOLOGY WITH ANESTHESIA N/A 04/07/2017   Procedure: RADIOLOGY WITH ANESTHESIA;  Surgeon: Lisbeth Renshaw, MD;  Location: [facility] OR;  Service: Radiology;  Laterality: N/A;  . REPLACEMENT TOTAL KNEE BILATERAL  2004  . TEMPOROMANDIBULAR JOINT SURGERY     2 surgeries  . TUMOR REMOVAL    . VAGOTOMY  01/23/2012   Procedure: VAGOTOMY, antrectomy and BII;  Surgeon: Currie Paris, MD;  Location: [facility] OR;  Service: General;  Laterality: N/A;  Laparotomy with vagotomy.    There were no vitals filed for this visit.      Subjective Assessment - 07/15/17 1410    Patient is accompained by: Family member  husband:   [name]    Pertinent History Subarachnoid hemorrhage due to ruptured aneurysm, ataxia (L cerebellar); HTN, R side decr sensation, vertical diplopia/visual deficits, ataxia, Bilateral retinal hemorhage associated with SAH (with surgery on R eye in hospital and L approx 1 month ago)   Limitations visual deficits, fall risk, impulsivity/cognitive deficits   Patient Stated Goals be able to walk and use L arm   Currently in Pain? Yes   Pain Score 5    Pain Location Leg  head 2-3/10 pain   Pain Orientation Right   Pain Descriptors / Indicators Headache;Pins and needles;Burning   Pain Type Acute pain   Pain Onset 1 to 4 weeks ago   Pain Frequency Constant   Aggravating Factors  nothing   Pain Relieving Factors  movement   Effect of Pain on Daily Activities OT will monitor headache/R side pain as relates to treatment, but will not directly address due to nature/location           [facility] - 07/16/17 0001      Assessment   Diagnosis Subarachnoid hemorrhage due to ruptured aneurysm, ataxia (L cerebellar)   Referring Provider Dr. Claudette LawsAndrew Kirsteins   Onset Date 04/07/17   Assessment hospitalized 04/07/17-05/07/17 per pt   Prior Therapy CIR, home health OT/PT     Precautions   Precautions Fall  24 hour supervision, visual deficits      Balance Screen   Has the patient fallen in the past 6 months No     Home  Environment   Family/patient expects to be discharged to: Private residence   Home Layout One level   Lives With Spouse;Daughter  12 y.o. 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   Leisure shopping, hanging out with family  reports using treadmill for 5 min at a time at home     ADL   Eating/Feeding Set up  difficulty opening due to squeezing too hard, messy   Grooming Modified independent  needs cueing to use LUE   Upper Body Bathing --  min A for drying back   Lower Body Bathing Supervision/safety  including shaving   Upper Body Dressing --  mod I, hasn't attempted buttons   Lower Body Dressing Modified independent  including tying shoes   Toilet Transfer Moderate assistance   Toileting - Clothing Manipulation Minimal assistance  needs cueing for use of LUE   Toileting -  Hygiene Modified Independent   Tub/Shower Transfer Maximal assistance  has tub bench, but not currently using    Tub/Shower Transfer Details (indicate cue type and reason Pt reports husband is lowering her into tub (and that she prefers this to tub bench)   Equipment Used --  has tub transfer bench and RW    ADL comments 24 hr supervision     IADL   Shopping Completely unable to shop   Light Housekeeping Does not participate in any housekeeping tasks  has folded a few towels once   Meal Prep Needs to have meals prepared and served   [employer] Relies on family or friends for transportation   Financial Management Dependent     Mobility   Mobility Status Needs assist   Mobility Status Comments pt reports typically holding onto person with ambulation (only uses RW approx 40% of the time) due to difficulty with RW, never ambulates alone  impulsivity and decr safety reported, goes to L side with RW     Written Expression   Dominant Hand Right    Handwriting --  not assessed      Vision - History   Additional Comments bilateral retinal hemorrhage (associated with SAH) with surgery     Vision Assessment   Eye Alignment Impaired (comment)   Vision Assessment Vision impaired  _ to be further tested in functional context   Diplopia Assessment Present in primary gaze  vertical per pt report   Comment pt reports attempts to read give her a headache  being followed by neuroophthalmologist     Cognition   Overall Cognitive Status Impaired/Different from baseline   Area of Impairment Safety/judgement;Awareness;Problem solving   Safety/Judgement Decreased awareness of safety;Decreased awareness of deficits   Attention Selective;Alternating  impaired   Problem Solving Impaired   Behaviors Impulsive     Sensation   Light Touch Impaired by gross assessment   Light Touch Impaired Details Absent RUE   Hot/Cold Impaired by gross assessment  per pt report RUE   Proprioception Impaired by gross assessment   Additional Comments impaired R side; reports hypersensitivity to touch in R foot/R forearm/hand (pins and needles)     Coordination   Gross Motor Movements are Fluid and Coordinated No  ataxia   Fine Motor Movements are Fluid and Coordinated No  ataxia   9 Hole Peg Test Right   Right 9 Hole Peg Test 27.47   Box and Blocks R-50 blocks, L-19blocks     Perception   Perception Impaired   Inattention/Neglect --  inattention to LUE     Praxis   Praxis --     ROM / Strength   AROM / PROM / Strength Strength;AROM     AROM   Overall AROM  Within functional limits for tasks performed   Overall AROM Comments with decr coordination and control particularly LUE for finger opposition     Strength   Overall Strength Deficits   Overall Strength Comments Shoulders not formally assessed, but decr in bilateral hands and decr coordination, ataxia, and decr grading of movement, particularly with LUE     Hand Function   Right Hand  Grip (lbs) 35   Left Hand Grip (lbs) 20                         OT Education - 07/15/17 2106    Education provided Yes   Education Details OT Eval Results/POC; recommendation for folding towels, stirring/dusting in sitting for incr bilateral hand use and participation in functional activity; instructed pt/husband in how to adjust shower curtain with tub bench to decr water getting out of tub   Person(s) Educated Patient;Spouse   Methods Explanation   Comprehension Verbalized understanding          OT Short Term Goals - 07/16/17 1221      OT SHORT TERM GOAL #1   Title Pt/caregiver will be independent with initial HEP.--check STGs 08/14/17   Time 4   Period Weeks   Status New     OT SHORT TERM GOAL #2   Title Pt will be use LUE as nondominant assist at least 50% of the time for ADLs without cueing.   Time 4   Period Weeks   Status New     OT SHORT TERM GOAL #3   Title Pt will improve LUE coordination/control to improve score on box and blocks test by at least 6 with LUE.   Baseline 19 blocks   Time 4   Period Weeks   Status New     OT SHORT TERM GOAL #4   Title Pt will perform simple home maintenance task/snack prep in sitting with set-up.   Time 4   Period Weeks   Status New     OT SHORT TERM GOAL #5   Title Pt/husband will verbalize understanding of visual compensation stategies for improved safety/incr ease with ADLs.   Time 4   Period Weeks   Status New           OT Long Term Goals - 07/16/17 1907      OT LONG TERM GOAL #1   Title Pt/caregiver will be independent with updated HEP.--check LTGs 09/13/17   Time 8   Period  Weeks   Status New     OT LONG TERM GOAL #2   Title Pt will be use LUE as nondominant assist at least 75% of the time for ADLs without cueing.   Time 8   Period Weeks   Status New     OT LONG TERM GOAL #3   Title Pt will perform toileting mod I (including transfer).   Time 8   Period Weeks   Status New     OT  LONG TERM GOAL #4   Title Pt will improve LUE coordination/control to improve score on box and blocks test by at least 12 with LUE.   Baseline 19 blocks   Time 8   Period Weeks   Status New     OT LONG TERM GOAL #5   Title Pt will perform simple home maintenance task/snack prep in standing with set-up/supervision.   Time 8   Period Weeks   Status New     OT LONG TERM GOAL #6   Title Pt will perform simple environmental scanning/navigation with supervision.   Time 8   Period Weeks   Status New               Plan - 07/16/17 1206    Clinical Impression Statement Pt presents with ataxia, L hemiparesis, bilateral sensation deficits, L inattention, decr coordination, visual deficit, cognitive deficits, decr balance/functional mobility and decr safety impacting ADL/IADL performance/participation.  Pt would benefit from occupational therapy in order to incr safety and independence with ADLs/IADLs for improved quality of life and decr caregiver burden.    Occupational Profile and client history currently impacting functional performance Pt is a 41 y.o. female s/p subarachnoid hemorrhage due to ruptured aneurysm, L cerebellar infarct with ataxia.  Pt was working full time and indpendent prior to hospitalization.  Pt lives with husband and cared for 59 y.o. daughter.  Medical history includes HTN, GERD, and Bilateral retinal hemorhage associated with SAH (needed surgery).     Occupational performance deficits (Please refer to evaluation for details): ADL's;IADL's;Leisure;Social Participation;Work   Editor, commissioning   OT Frequency 2x / week   OT Duration 8 weeks  +eval   OT Treatment/Interventions Self-care/ADL training;DME and/or AE instruction;Patient/family education;Therapeutic exercises;Balance training;Fluidtherapy;Moist Heat;Ultrasound;Therapeutic exercise;Therapeutic activities;Cryotherapy;Neuromuscular education;Functional Mobility Training;Passive range of motion;Cognitive  remediation/compensation;Visual/perceptual remediation/compensation;Manual Therapy   Plan LUE functional use/HEP   Clinical Decision Making Several treatment options, min-mod task modification necessary   Consulted and Agree with Plan of Care Patient;Family member/caregiver   Family Member Consulted husband      Patient will benefit from skilled therapeutic intervention in order to improve the following deficits and impairments:  Decreased coordination, Decreased range of motion, Difficulty walking, Abnormal gait, Decreased safety awareness, Impaired sensation, Decreased knowledge of precautions, Decreased balance, Decreased knowledge of use of DME, Impaired UE functional use, Decreased cognition, Decreased mobility, Decreased strength, Impaired vision/preception, Impaired perceived functional ability  Visit Diagnosis: 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 Elevated LDL cholesterol level 07/11/2017  . [employer] (NIH) Stroke Scale limb ataxia score 2, ataxia present in two limbs  06/25/2017  . 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 Vitamin D deficiency 05/29/2017  . History of tobacco abuse-  30pk yr hx - quit 04/07/17 05/21/2017  . Gait disturbance, post-stroke 05/14/2017  . Benign essential HTN   . Vitreous hemorrhage of right eye ([name])   . Adjustment disorder with mixed anxiety and depressed mood   . 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 Terson  syndrome of both eyes ([name]) 04/23/2017  . SAH (subarachnoid hemorrhage) ([name]) 04/19/2017  . Basilar artery aneurysm ([name])   . Hypoxia   . 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 CVA (cerebral vascular accident) ([name]) 04/07/2017  . Elevated gastrin level 01/25/2012  . Hypokalemia 01/21/2012  . Nausea & vomiting 01/20/2012  . Epigastric pain 01/20/2012  . Duodenal ulcer, acute with obstruction 10/17/2011  . S/P laparoscopic cholecystectomy 10/16/2011    [facility] 07/16/2017, 7:18 PM  [facility] [address] [place], [place], [address] Phone: [phone]   Fax:  [phone]  Name: [name] Barnett MRN: [id number] Date of Birth: 1976-02-21   Willa Frater, OTR/L [facility] [address] [place], [place]  [address] [phone] phone [phone] 07/16/17 7:18 PM

## 2017-07-16 NOTE — Therapy (Signed)
[facility] [address] [place], [place], [address] Phone: [phone]   Fax:  [phone]  Physical Therapy Evaluation  Patient Details  Name: [name] Barnett MRN: [id number] Date of Birth: [birth year] Referring Provider: Erick Colace, MD  Encounter Date: 07/15/2017      PT End of Session - 07/16/17 1624    Visit Number 1   Number of Visits 17   Date for PT Re-Evaluation 09/13/17   Authorization Type BCBS   PT Start Time 1447   PT Stop Time 1530   PT Time Calculation (min) 43 min   Activity Tolerance Patient tolerated treatment well   Behavior During Therapy Restless      Past Medical History:  Diagnosis Date  . Duodenal obstruction   . GERD (gastroesophageal reflux disease)   . Hypertension     Past Surgical History:  Procedure Laterality Date  . ABDOMINAL HYSTERECTOMY    . BALLOON DILATION  12/31/2011   Procedure: BALLOON DILATION;  Surgeon: Barrie Folk, MD;  Location: [facility] ENDOSCOPY;  Service: Endoscopy;  Laterality: N/A;  . CHOLECYSTECTOMY  07/28/11  . ESOPHAGOGASTRODUODENOSCOPY  10/17/2011   Procedure: ESOPHAGOGASTRODUODENOSCOPY (EGD);  Surgeon: Barrie Folk, MD;  Location: [facility] ENDOSCOPY;  Service: Endoscopy;  Laterality: N/A;  . IR ANGIO INTRA EXTRACRAN SEL INTERNAL CAROTID BILAT MOD SED  04/07/2017  . IR ANGIO VERTEBRAL SEL VERTEBRAL UNI R MOD SED  04/07/2017  . IR ANGIOGRAM FOLLOW UP STUDY  04/07/2017  . IR ANGIOGRAM FOLLOW UP STUDY  04/07/2017  . IR ANGIOGRAM FOLLOW UP STUDY  04/07/2017  . IR ANGIOGRAM FOLLOW UP STUDY  04/07/2017  . IR ANGIOGRAM FOLLOW UP STUDY  04/07/2017  . IR ANGIOGRAM SELECTIVE EACH ADDITIONAL VESSEL  04/07/2017  . IR TRANSCATH/EMBOLIZ  04/07/2017  . PARS PLANA VITRECTOMY Right 05/01/2017   Procedure: PARS PLANA VITRECTOMY WITH 25 GAUGE RIGHT EYE, endolaser photocoaglation;  Surgeon: Carmela Rima, MD;  Location: [facility] OR;  Service: Ophthalmology;  Laterality: Right;  . RADIOLOGY WITH  ANESTHESIA N/A 04/07/2017   Procedure: RADIOLOGY WITH ANESTHESIA;  Surgeon: Lisbeth Renshaw, MD;  Location: [facility] OR;  Service: Radiology;  Laterality: N/A;  . REPLACEMENT TOTAL KNEE BILATERAL  2004  . TEMPOROMANDIBULAR JOINT SURGERY     2 surgeries  . TUMOR REMOVAL    . VAGOTOMY  01/23/2012   Procedure: VAGOTOMY, antrectomy and BII;  Surgeon: Currie Paris, MD;  Location: [facility] OR;  Service: General;  Laterality: N/A;  Laparotomy with vagotomy.    Vitals:   07/15/17 1456  BP: 128/87  Pulse: 77         Subjective Assessment - 07/15/17 1457    Subjective Pt presents to OPPT s/p SAH and L cerebellar infarct with L hemi ataxia and R hemi sensory loss.  Pt currently ambulating with RW in and outside the home.  No falls at home.  Pt continues to have vertical diplopia, dizziness, leaning/veering to R and impaired coordination on L side.   Patient is accompained by: Family member   Limitations Standing;Walking   Currently in Pain? Yes   Pain Score 5    Pain Location Leg   Pain Orientation Right   Pain Descriptors / Indicators Pins and needles;Burning   Pain Type Acute pain            [facility] PT Assessment - 07/15/17 1503      Assessment   Medical Diagnosis L cerebellar infarct   Referring Provider Erick Colace, MD  Onset Date/Surgical Date 04/07/17   Prior Therapy inpatient rehab, HHPT/OT after D/C from hospital     Precautions   Precautions Fall;Other (comment)   Precaution Comments HTN ASSESS VITALS AT [facility] SESSION; dizziness and diplopia     Balance Screen   Has the patient fallen in the past 6 months No   Has the patient had a decrease in activity level because of a fear of falling?  Yes   Is the patient reluctant to leave their home because of a fear of falling?  Yes     Home Environment   Living Environment Private residence   Living Arrangements Spouse/significant other   Type of Home House   Home Access Stairs to enter   Entrance Stairs-Number of Steps 4    Entrance Stairs-Rails Right;Left   Home Layout One level   Home Equipment Walker - 2 wheels     Prior Function   Level of Independence Independent   Vocation Full time employment   Leisure shopping, hanging out with family     Observation/Other Assessments   Focus on Therapeutic Outcomes (FOTO)  40 (60% limited; predicted 44% limited)   Other Surveys  Other Surveys   Neuro Quality of Life  LE: 31.3%     Sensation   Light Touch Impaired Detail   Light Touch Impaired Details Absent RLE   Proprioception Impaired by gross assessment   Additional Comments impaired R side; reports hypersensitivity to touch in R foot (pins and needles)     Coordination   Gross Motor Movements are Fluid and Coordinated No   Heel Shin Test WFL RLE, overshooting LLE     ROM / Strength   AROM / PROM / Strength Strength     Strength   Overall Strength Within functional limits for tasks performed   Overall Strength Comments Strength WFL but impaired coordination, impaired grading of movement, motor control, ataxia     Transfers   Transfers Sit to Stand;Stand to Sit   Sit to Stand 3: Mod assist;4: Min assist;With upper extremity assist;With armrests   Sit to Stand Details (indicate cue type and reason) during initial sit > stand pt leans significantly to R   Stand to Sit 4: Min assist;With armrests     Ambulation/Gait   Ambulation/Gait Yes   Ambulation/Gait Assistance 3: Mod assist   Ambulation/Gait Assistance Details Assistance required to maintain straight navigation of RW-pt pushes RW to L, verbal and visual cues for full step length bilaterally, weight shifting laterally and to widen BOS   Ambulation Distance (Feet) 100 Feet   Assistive device Rolling walker   Gait Pattern Step-to pattern;Decreased step length - right;Decreased step length - left;Decreased stride length;Decreased weight shift to left;Decreased weight shift to right;Ataxic;Decreased trunk rotation;Trunk flexed;Narrow base of  support   Ambulation Surface Level;Indoor     Standardized Balance Assessment   Standardized Balance Assessment Berg Balance Test     Berg Balance Test   Sit to Stand Needs minimal aid to stand or to stabilize   Standing Unsupported Able to stand 2 minutes with supervision   Sitting with Back Unsupported but Feet Supported on Floor or Stool Able to sit safely and securely 2 minutes   Stand to Sit Controls descent by using hands   Transfers Needs one person to assist   Standing Unsupported with Eyes Closed Able to stand 3 seconds   Standing Ubsupported with Feet Together Needs help to attain position but able to stand for 30 seconds with feet  together   From Standing, Reach Forward with Outstretched Arm Reaches forward but needs supervision   From Standing Position, Pick up Object from Floor Unable to try/needs assist to keep balance   From Standing Position, Turn to Look Behind Over each Shoulder Needs assist to keep from losing balance and falling   Turn 360 Degrees Needs assistance while turning   Standing Unsupported, Alternately Place Feet on Step/Stool Needs assistance to keep from falling or unable to try   Standing Unsupported, One Foot in Front Needs help to step but can hold 15 seconds   Standing on One Leg Unable to try or needs assist to prevent fall   Total Score 17   Berg comment: 17/56            Objective measurements completed on examination: See above findings.                  PT Education - 07/16/17 1624    Education provided Yes   Education Details Clinical findings and PT POC and goals, gait sequence   Person(s) Educated Patient;Spouse   Methods Explanation;Demonstration   Comprehension Verbalized understanding          PT Short Term Goals - 07/16/17 1639      PT SHORT TERM GOAL #1   Title Pt will participate in vestibular assessment and assessment of gait velocity with LTG to be set    Time 4   Period Weeks   Status New   Target  Date 08/14/17     PT SHORT TERM GOAL #2   Title Pt will improve balance and midline orientation to perform sit <> stand with supervision and be able to maintain static standing x 2 minutes without UE support and no LOB to R   Time 4   Period Weeks   Status New   Target Date 08/14/17     PT SHORT TERM GOAL #3   Title Pt will decrease falls risk as indicated by increase in BERG balance score to > or = 22/56   Baseline 17/56   Time 4   Period Weeks   Status New   Target Date 08/14/17     PT SHORT TERM GOAL #4   Title Pt will ambulate x 150' on indoor surfaces with LRAD and min A and require <10% assistance to maintain AD in straight path and <10% verbal cues for gait pattern.   Time 4   Period Weeks   Status New   Target Date 08/14/17           PT Long Term Goals - 07/16/17 1646      PT LONG TERM GOAL #1   Title Pt and husband will demonstrate independence with HEP   Time 8   Period Weeks   Status New   Target Date 09/13/17     PT LONG TERM GOAL #2   Title Pt will demonstrate improved balance and decreased falls risk as indicated by BERG balance score of > or = 27/56   Time 8   Period Weeks   Status New   Target Date 09/13/17     PT LONG TERM GOAL #3   Title Pt will ambulate with LRAD x 300' over level indoor and paved outdoor surfaces with supervision and intermittent cues for gait sequence   Time 8   Period Weeks   Status New   Target Date 09/13/17     PT LONG TERM GOAL #4   Title Pt  will report 15% improvement in Neuro QOL-LE   Baseline 31.3%   Time 8   Period Weeks   Status New   Target Date 09/13/17     PT LONG TERM GOAL #5   Title Pt will decrease falls risk during gait in home/community as indicated by increase in gait velocity to > or = 1.398ft/sec with LRAD   Baseline TBD   Time 8   Period Weeks   Status New   Target Date 09/13/17     Additional Long Term Goals   Additional Long Term Goals Yes     PT LONG TERM GOAL #6   Title Vestibular goal  if indicated   Baseline TBD                Plan - 07/16/17 1625    Clinical Impression Statement Pt is a 41 year old female presenting to OPPT neuro for medium complexity PT evaluation s/p 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 Pt's PMH significant for the following: HTN. The following deficits were noted during pt's exam: L hemi ataxia, R hemi sensory loss but does experience neuropathic pain, impaired coordination, motor planning and grading of movement, impaired balance, impaired vision with diplopia, dizziness, and gait abnormalities. Pt's BERG score of 17/56 indicates pt is at high risk for falls. Pt's condition is evolving and would benefit from skilled PT to address these impairments and functional limitations to maximize functional mobility independence and reduce falls risk.   History and Personal Factors relevant to plan of care: HTN, independent and working [facility]   Clinical Presentation Evolving   Clinical Presentation due to: high falls risk due to abnormal gait, ataxia, sensory changes, impaired vision, dizziness, impaired cognition   Clinical Decision Making Moderate   Rehab Potential Good   PT Frequency 2x / week   PT Duration 8 weeks   PT Treatment/Interventions ADLs/Self Care Home Management;Electrical Stimulation;DME Instruction;Gait training;Stair training;Functional mobility training;Therapeutic activities;Therapeutic exercise;Balance training;Neuromuscular re-education;Cognitive remediation;Patient/family education;Orthotic Fit/Training;Vestibular;Visual/perceptual remediation/compensation   PT Next Visit Plan ASSESS VITALS EACH VISIT; vestibular assessment when appropriate; assess gait velocity and revise LTG; NMR and balance training: weight shifting to L, WB for proprioceptive feedback, coordination   Consulted and Agree with Plan of Care Patient;Family member/caregiver   Family Member Consulted husband       Patient will benefit from skilled therapeutic intervention in order to improve the following deficits and impairments:  Abnormal gait, Decreased balance, Decreased cognition, Decreased coordination, Decreased strength, Difficulty walking, Dizziness, Impaired sensation, Impaired vision/preception, Pain  Visit Diagnosis: Ataxic gait  Other lack of coordination  Other abnormalities of gait and mobility  Unsteadiness on feet  Dizziness and giddiness     Problem List Patient Active Problem List   Diagnosis Date Noted  . Elevated LDL cholesterol level 07/11/2017  . [employer] (NIH) Stroke Scale limb ataxia score 2, ataxia present in two limbs 06/25/2017  . 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 Vitamin D deficiency 05/29/2017  . History of tobacco abuse-  30pk yr hx - quit 04/07/17 05/21/2017  . Gait disturbance, post-stroke 05/14/2017  . Benign essential HTN   . Vitreous hemorrhage of right eye ([name])   . Adjustment disorder with mixed anxiety and depressed mood   . 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 SAH (subarachnoid hemorrhage) ([name]) 04/19/2017  . Basilar artery aneurysm ([name])   .  Hypoxia   . 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 CVA (cerebral vascular accident) ([name]) 04/07/2017  . Elevated gastrin level 01/25/2012  . Hypokalemia 01/21/2012  . Nausea & vomiting 01/20/2012  . Epigastric pain 01/20/2012  . Duodenal ulcer, acute with obstruction 10/17/2011  . S/P laparoscopic cholecystectomy 10/16/2011    Edman Circle, PT, DPT 07/16/17    4:54 PM    [facility] [address] [place], [place], [address] Phone: [phone]   Fax:  [phone]  Name: [name] Barnett MRN: [id number] Date of Birth: 21-Apr-1976

## 2017-07-29 ENCOUNTER — Encounter: Payer: Self-pay | Admitting: Rehabilitation

## 2017-07-29 ENCOUNTER — Ambulatory Visit: Payer: BLUE CROSS/BLUE SHIELD | Admitting: Occupational Therapy

## 2017-07-29 ENCOUNTER — Ambulatory Visit: Payer: BLUE CROSS/BLUE SHIELD | Admitting: Rehabilitation

## 2017-07-29 DIAGNOSIS — R208 Other disturbances of skin sensation: Secondary | ICD-10-CM

## 2017-07-29 DIAGNOSIS — R270 Ataxia, unspecified: Secondary | ICD-10-CM

## 2017-07-29 DIAGNOSIS — R2689 Other abnormalities of gait and mobility: Secondary | ICD-10-CM

## 2017-07-29 DIAGNOSIS — I69054 Hemiplegia and hemiparesis following nontraumatic subarachnoid hemorrhage affecting left non-dominant side: Secondary | ICD-10-CM

## 2017-07-29 DIAGNOSIS — R278 Other lack of coordination: Secondary | ICD-10-CM

## 2017-07-29 DIAGNOSIS — R2681 Unsteadiness on feet: Secondary | ICD-10-CM

## 2017-07-29 DIAGNOSIS — R260 Ataxic gait: Secondary | ICD-10-CM | POA: Diagnosis not present

## 2017-07-29 DIAGNOSIS — R414 Neurologic neglect syndrome: Secondary | ICD-10-CM

## 2017-07-29 DIAGNOSIS — I69018 Other symptoms and signs involving cognitive functions following nontraumatic subarachnoid hemorrhage: Secondary | ICD-10-CM

## 2017-07-29 DIAGNOSIS — R41842 Visuospatial deficit: Secondary | ICD-10-CM

## 2017-07-29 DIAGNOSIS — R41840 Attention and concentration deficit: Secondary | ICD-10-CM

## 2017-07-29 NOTE — Patient Instructions (Signed)
  Coordination Activities:  Perform for 2x/day  1.  Place 4-5 empty plastic bottles/cups at arm's length on table in front of you.  Slowly reach out to touch each lightly, trying not to move it.  Keep  Shoulder down and thumb facing up.  2. Pick up plastic cup with small amount of water in it.  Pick up slowly and bring to your mouth to drink.  Then replace on table gently.  3.  Place 4 empty plastic bottles/cups on table (to make obstacle course).  Wipe table trying to avoid hitting/moving bottles.    4.  Flip playing cards over slowly 1 at a time turning palm up.  Keep shoulder down  5.  Stack blocks then [place] 1 at a time. (start with 5 and increase as able).

## 2017-07-29 NOTE — Therapy (Signed)
[facility] [address] [place], [place], [address] Phone: [phone]   Fax:  [phone]  Occupational Therapy Treatment  Patient Details  Name: [name] Barnett MRN: [id number] Date of Birth: September 05, 1976 Referring Provider: Dr. Claudette Laws  Encounter Date: 07/29/2017      OT End of Session - 07/29/17 1447    Visit Number 2   Number of Visits 17   Date for OT Re-Evaluation 09/13/17   Authorization Type BCBS, no visit limit/auth req.   OT Start Time 1448   OT Stop Time 1530   OT Time Calculation (min) 42 min   Activity Tolerance Patient tolerated treatment well   Behavior During Therapy Impulsive;WFL for tasks assessed/performed      Past Medical History:  Diagnosis Date  . Duodenal obstruction   . GERD (gastroesophageal reflux disease)   . Hypertension     Past Surgical History:  Procedure Laterality Date  . ABDOMINAL HYSTERECTOMY    . BALLOON DILATION  12/31/2011   Procedure: BALLOON DILATION;  Surgeon: Barrie Folk, MD;  Location: [facility] ENDOSCOPY;  Service: Endoscopy;  Laterality: N/A;  . CHOLECYSTECTOMY  07/28/11  . ESOPHAGOGASTRODUODENOSCOPY  10/17/2011   Procedure: ESOPHAGOGASTRODUODENOSCOPY (EGD);  Surgeon: Barrie Folk, MD;  Location: [facility] ENDOSCOPY;  Service: Endoscopy;  Laterality: N/A;  . IR ANGIO INTRA EXTRACRAN SEL INTERNAL CAROTID BILAT MOD SED  04/07/2017  . IR ANGIO VERTEBRAL SEL VERTEBRAL UNI R MOD SED  04/07/2017  . IR ANGIOGRAM FOLLOW UP STUDY  04/07/2017  . IR ANGIOGRAM FOLLOW UP STUDY  04/07/2017  . IR ANGIOGRAM FOLLOW UP STUDY  04/07/2017  . IR ANGIOGRAM FOLLOW UP STUDY  04/07/2017  . IR ANGIOGRAM FOLLOW UP STUDY  04/07/2017  . IR ANGIOGRAM SELECTIVE EACH ADDITIONAL VESSEL  04/07/2017  . IR TRANSCATH/EMBOLIZ  04/07/2017  . PARS PLANA VITRECTOMY Right 05/01/2017   Procedure: PARS PLANA VITRECTOMY WITH 25 GAUGE RIGHT EYE, endolaser photocoaglation;  Surgeon: Carmela Rima, MD;  Location: [facility] OR;   Service: Ophthalmology;  Laterality: Right;  . RADIOLOGY WITH ANESTHESIA N/A 04/07/2017   Procedure: RADIOLOGY WITH ANESTHESIA;  Surgeon: Lisbeth Renshaw, MD;  Location: [facility] OR;  Service: Radiology;  Laterality: N/A;  . REPLACEMENT TOTAL KNEE BILATERAL  2004  . TEMPOROMANDIBULAR JOINT SURGERY     2 surgeries  . TUMOR REMOVAL    . VAGOTOMY  01/23/2012   Procedure: VAGOTOMY, antrectomy and BII;  Surgeon: Currie Paris, MD;  Location: [facility] OR;  Service: General;  Laterality: N/A;  Laparotomy with vagotomy.    There were no vitals filed for this visit.      Subjective Assessment - 07/29/17 1448    Subjective  fell yesterday--going to the bathroom by herself (busted lip and bruised LUE/knee).   Patient is accompained by: Family member  husband:   [name]    Pertinent History Subarachnoid hemorrhage due to ruptured aneurysm, ataxia (L cerebellar); HTN, R side decr sensation, vertical diplopia/visual deficits, ataxia, Bilateral retinal hemorhage associated with SAH (with surgery on R eye in hospital and L approx 1 month ago)   Limitations visual deficits, fall risk, impulsivity/cognitive deficits   Patient Stated Goals be able to walk and use L arm   Currently in Pain? No/denies  only mild muscle soreness/tiredness with use   Pain Onset 1 to 4 weeks ago         Ambulating lobby>gym with mod cueing/min A for RW management as pt tends to push more with RUE to veer to  the L with RW.  Mod cueing for impulsivity/safety awareness for stand>sit transfer.  Recommended/showed pt anti-cut gloves for seated kitchen tasks as pt reported using knife to cut tomato.  Recommended pt avoid anything sharp, heavy, breakable, or hot due to decr safety awareness/impulsivity/decr sensation.  If pt wants to try cutting food, recommended she only try with supervision and using anti-cut gloves.  Pt/husband instructed where to purchase and verbalized understanding.  Folding towel with min-mod decr accuracy.  Pt  instructed to slow down when doing this at home.                     OT Education - 07/29/17 1553    Education Details LUE coordination/functional use HEP--see pt instructions; Use of slow movements for incr control and avoiding compensation patterns/shoulder hike to decr LUE pain   Person(s) Educated Patient;Spouse   Methods Explanation;Demonstration;Verbal cues;Handout   Comprehension Verbalized understanding;Returned demonstration;Verbal cues required  min-mod cueing          OT Short Term Goals - 07/16/17 1221      OT SHORT TERM GOAL #1   Title Pt/caregiver will be independent with initial HEP.--check STGs 08/14/17   Time 4   Period Weeks   Status New     OT SHORT TERM GOAL #2   Title Pt will be use LUE as nondominant assist at least 50% of the time for ADLs without cueing.   Time 4   Period Weeks   Status New     OT SHORT TERM GOAL #3   Title Pt will improve LUE coordination/control to improve score on box and blocks test by at least 6 with LUE.   Baseline 19 blocks   Time 4   Period Weeks   Status New     OT SHORT TERM GOAL #4   Title Pt will perform simple home maintenance task/snack prep in sitting with set-up.   Time 4   Period Weeks   Status New     OT SHORT TERM GOAL #5   Title Pt/husband will verbalize understanding of visual compensation stategies for improved safety/incr ease with ADLs.   Time 4   Period Weeks   Status New           OT Long Term Goals - 07/16/17 1907      OT LONG TERM GOAL #1   Title Pt/caregiver will be independent with updated HEP.--check LTGs 09/13/17   Time 8   Period Weeks   Status New     OT LONG TERM GOAL #2   Title Pt will be use LUE as nondominant assist at least 75% of the time for ADLs without cueing.   Time 8   Period Weeks   Status New     OT LONG TERM GOAL #3   Title Pt will perform toileting mod I (including transfer).   Time 8   Period Weeks   Status New     OT LONG TERM GOAL #4    Title Pt will improve LUE coordination/control to improve score on box and blocks test by at least 12 with LUE.   Baseline 19 blocks   Time 8   Period Weeks   Status New     OT LONG TERM GOAL #5   Title Pt will perform simple home maintenance task/snack prep in standing with set-up/supervision.   Time 8   Period Weeks   Status New     OT LONG TERM GOAL #6  Title Pt will perform simple environmental scanning/navigation with supervision.   Time 8   Period Weeks   Status New               Plan - 07/29/17 1552    Clinical Impression Statement Pt is progressing towards goals with improving coordination/LUE control with focus on slow controlled movements.   Rehab Potential Good   OT Frequency 2x / week   OT Duration 8 weeks  +eval   OT Treatment/Interventions Self-care/ADL training;DME and/or AE instruction;Patient/family education;Therapeutic exercises;Balance training;Fluidtherapy;Moist Heat;Ultrasound;Therapeutic exercise;Therapeutic activities;Cryotherapy;Neuromuscular education;Functional Mobility Training;Passive range of motion;Cognitive remediation/compensation;Visual/perceptual remediation/compensation;Manual Therapy   Plan continue with LUE functional use, coordination/control of LUE   Consulted and Agree with Plan of Care Patient;Family member/caregiver   Family Member Consulted husband      Patient will benefit from skilled therapeutic intervention in order to improve the following deficits and impairments:  Decreased coordination, Decreased range of motion, Difficulty walking, Abnormal gait, Decreased safety awareness, Impaired sensation, Decreased knowledge of precautions, Decreased balance, Decreased knowledge of use of DME, Impaired UE functional use, Decreased cognition, Decreased mobility, Decreased strength, Impaired vision/preception, Impaired perceived functional ability  Visit Diagnosis: 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 Elevated LDL cholesterol level 07/11/2017  . [employer] (NIH) Stroke Scale limb ataxia score 2, ataxia present in two limbs 06/25/2017  . 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 Vitamin D deficiency 05/29/2017  . History of tobacco abuse-  30pk yr hx - quit 04/07/17 05/21/2017  . Gait disturbance, post-stroke 05/14/2017  . Benign essential HTN   . Vitreous hemorrhage of right eye ([name])   . Adjustment disorder with mixed anxiety and depressed mood   . 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 SAH (subarachnoid hemorrhage) ([name]) 04/19/2017  . Basilar artery aneurysm ([name])   . Hypoxia   . 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 CVA (cerebral vascular accident) ([name]) 04/07/2017  . Elevated gastrin level 01/25/2012  . Hypokalemia 01/21/2012  . Nausea & vomiting 01/20/2012  . Epigastric pain 01/20/2012  . Duodenal ulcer, acute with obstruction 10/17/2011  . S/P laparoscopic cholecystectomy 10/16/2011    [facility] 07/29/2017, 4:06 PM  [facility] [address] [place], [place], [address] Phone: [phone]   Fax:  [phone]  Name: [name] Barnett MRN: [id number] Date of Birth: 12/29/1975   Willa Frater, OTR/L [facility] [address] [place], [place]  [address] [phone]  phone [phone] 07/29/17 4:06 PM

## 2017-07-29 NOTE — Therapy (Signed)
[facility] [address] [place], [place], [address] Phone: [phone]   Fax:  [phone]  Physical Therapy Treatment  Patient Details  Name: [name] Barnett MRN: [id number] Date of Birth: [birth year] Referring Provider: Erick Colace, MD  Encounter Date: 07/29/2017      PT End of Session - 07/29/17 1542    Visit Number 2   Number of Visits 17   Date for PT Re-Evaluation 09/13/17   Authorization Type BCBS   PT Start Time 1533   PT Stop Time 1615   PT Time Calculation (min) 42 min   Activity Tolerance Patient tolerated treatment well   Behavior During Therapy Restless      Past Medical History:  Diagnosis Date  . Duodenal obstruction   . GERD (gastroesophageal reflux disease)   . Hypertension     Past Surgical History:  Procedure Laterality Date  . ABDOMINAL HYSTERECTOMY    . BALLOON DILATION  12/31/2011   Procedure: BALLOON DILATION;  Surgeon: Barrie Folk, MD;  Location: [facility] ENDOSCOPY;  Service: Endoscopy;  Laterality: N/A;  . CHOLECYSTECTOMY  07/28/11  . ESOPHAGOGASTRODUODENOSCOPY  10/17/2011   Procedure: ESOPHAGOGASTRODUODENOSCOPY (EGD);  Surgeon: Barrie Folk, MD;  Location: [facility] ENDOSCOPY;  Service: Endoscopy;  Laterality: N/A;  . IR ANGIO INTRA EXTRACRAN SEL INTERNAL CAROTID BILAT MOD SED  04/07/2017  . IR ANGIO VERTEBRAL SEL VERTEBRAL UNI R MOD SED  04/07/2017  . IR ANGIOGRAM FOLLOW UP STUDY  04/07/2017  . IR ANGIOGRAM FOLLOW UP STUDY  04/07/2017  . IR ANGIOGRAM FOLLOW UP STUDY  04/07/2017  . IR ANGIOGRAM FOLLOW UP STUDY  04/07/2017  . IR ANGIOGRAM FOLLOW UP STUDY  04/07/2017  . IR ANGIOGRAM SELECTIVE EACH ADDITIONAL VESSEL  04/07/2017  . IR TRANSCATH/EMBOLIZ  04/07/2017  . PARS PLANA VITRECTOMY Right 05/01/2017   Procedure: PARS PLANA VITRECTOMY WITH 25 GAUGE RIGHT EYE, endolaser photocoaglation;  Surgeon: Carmela Rima, MD;  Location: [facility] OR;  Service: Ophthalmology;  Laterality: Right;  . RADIOLOGY WITH  ANESTHESIA N/A 04/07/2017   Procedure: RADIOLOGY WITH ANESTHESIA;  Surgeon: Lisbeth Renshaw, MD;  Location: [facility] OR;  Service: Radiology;  Laterality: N/A;  . REPLACEMENT TOTAL KNEE BILATERAL  2004  . TEMPOROMANDIBULAR JOINT SURGERY     2 surgeries  . TUMOR REMOVAL    . VAGOTOMY  01/23/2012   Procedure: VAGOTOMY, antrectomy and BII;  Surgeon: Currie Paris, MD;  Location: [facility] OR;  Service: General;  Laterality: N/A;  Laparotomy with vagotomy.    There were no vitals filed for this visit.      Subjective Assessment - 07/29/17 1540    Subjective Pt states she had a fall yesterday getting up by herself to go to restroom.     Patient is accompained by: Family member  [name], husband   Limitations Standing;Walking   Currently in Pain? No/denies                         [facility] Adult PT Treatment/Exercise - 07/29/17 0001      Ambulation/Gait   Ambulation/Gait Yes   Ambulation/Gait Assistance 4: Min assist;3: Mod assist   Ambulation/Gait Assistance Details Pt ambulated with RW during session with min to mod A (mod A when making turns due to LOB to the R) with cues for positioning inside of RW and larger step length.  Note during turns she tends to lose balance to the R or when trying to correct RW placement, she  tends to lose balance to the R.     Ambulation Distance (Feet) 100 Feet  then another 54'   Assistive device Rolling walker   Gait Pattern Step-to pattern;Decreased step length - right;Decreased step length - left;Decreased stride length;Decreased weight shift to left;Decreased weight shift to right;Ataxic;Decreased trunk rotation;Trunk flexed;Narrow base of support   Ambulation Surface Level;Indoor   Gait velocity .86 ft/sec with RW and min A     Self-Care   Self-Care Other Self-Care Comments   Other Self-Care Comments  Discussed safety and fall prevention at home due to fall over the weekend.  Pt attempting to get up with RW but unassisted by family.  Pt not safe  to move on her own at this time.  Pt able to state that she would not do this again due to safety concerns.       Therapeutic Activites    Therapeutic Activities Other Therapeutic Activities   Other Therapeutic Activities Worked on floor recovery during session due to recent fall and stating that her husband "picked her up".  Provided demo and verbal cues for correct technique.  Performed x 2 reps during session to ensure safety.  Still needed cues for slower movements to decrease dizziness.       Neuro Re-ed    Neuro Re-ed Details  While on floor on therapy mat, worked in quadruped posiition for [employer] and improved proprioceptive feedback.  Pt needing to place R hand in fist position for comfort.  Performed alternating LEs into extension x 2 sets of 5 reps with tactile and verbal cues for posture and decreased lateral movement.  Progressed to alternating opposite UE/LE into extension x 5 reps with cues for slower more controlled movement.  Pt tolerated well with rest breaks in between.  Ended session with NMR in very small mid ranges with sit<> mini stand x 6 reps.  While in squat position, had pt work on elevating and lowering slightly and moving laterally to emphasize LLE WB as she tends to keep weight shifting onto RLE.  Provided education that her and husband could work on this at home.  Both verbalized understanding.  Note that PT attempted visual targeting during all upright and NMR tasks, however this did not seem to improve symptoms.                  PT Education - 07/29/17 2018    Education provided Yes   Education Details fall prevention, floor recovery and beginning mid range squats with husband at home.    Person(s) Educated Patient;Spouse   Methods Explanation;Demonstration   Comprehension Verbalized understanding          PT Short Term Goals - 07/16/17 1639      PT SHORT TERM GOAL #1   Title Pt will participate in vestibular assessment and assessment of gait velocity with  LTG to be set    Time 4   Period Weeks   Status New   Target Date 08/14/17     PT SHORT TERM GOAL #2   Title Pt will improve balance and midline orientation to perform sit <> stand with supervision and be able to maintain static standing x 2 minutes without UE support and no LOB to R   Time 4   Period Weeks   Status New   Target Date 08/14/17     PT SHORT TERM GOAL #3   Title Pt will decrease falls risk as indicated by increase in BERG balance score to >  or = 22/56   Baseline 17/56   Time 4   Period Weeks   Status New   Target Date 08/14/17     PT SHORT TERM GOAL #4   Title Pt will ambulate x 150' on indoor surfaces with LRAD and min A and require <10% assistance to maintain AD in straight path and <10% verbal cues for gait pattern.   Time 4   Period Weeks   Status New   Target Date 08/14/17           PT Long Term Goals - 07/29/17 2022      PT LONG TERM GOAL #1   Title Pt and husband will demonstrate independence with HEP   Time 8   Period Weeks   Status New     PT LONG TERM GOAL #2   Title Pt will demonstrate improved balance and decreased falls risk as indicated by BERG balance score of > or = 27/56   Time 8   Period Weeks   Status New     PT LONG TERM GOAL #3   Title Pt will ambulate with LRAD x 300' over level indoor and paved outdoor surfaces with supervision and intermittent cues for gait sequence   Time 8   Period Weeks   Status New     PT LONG TERM GOAL #4   Title Pt will report 15% improvement in Neuro QOL-LE   Baseline 31.3%   Time 8   Period Weeks   Status New     PT LONG TERM GOAL #5   Title Pt will decrease falls risk during gait in home/community as indicated by increase in gait velocity to > or = 1.73ft/sec with LRAD   Baseline .86 ft/sec with RW and up to mod A on 07/29/17   Time 8   Period Weeks   Status New     PT LONG TERM GOAL #6   Title Vestibular goal if indicated   Baseline TBD               Plan - 07/29/17 2020     Clinical Impression Statement Skilled session focused on assessment of gait speed in which pt demos gait speed of .86 ft/sec indicative of elevated fall risk, gait with RW, and NMR tasks to emphasize slow and controlled movements.  Pt continues to be very impulsive and limited by visual/vestibular deficits, despite attempts at visual targeting during session.     Rehab Potential Good   PT Frequency 2x / week   PT Duration 8 weeks   PT Treatment/Interventions ADLs/Self Care Home Management;Electrical Stimulation;DME Instruction;Gait training;Stair training;Functional mobility training;Therapeutic activities;Therapeutic exercise;Balance training;Neuromuscular re-education;Cognitive remediation;Patient/family education;Orthotic Fit/Training;Vestibular;Visual/perceptual remediation/compensation   PT Next Visit Plan ASSESS VITALS EACH VISIT; vestibular assessment when appropriate; assess gait velocity and revise LTG; NMR and balance training: weight shifting to L, WB for proprioceptive feedback, coordination   Consulted and Agree with Plan of Care Patient;Family member/caregiver   Family Member Consulted husband      Patient will benefit from skilled therapeutic intervention in order to improve the following deficits and impairments:  Abnormal gait, Decreased balance, Decreased cognition, Decreased coordination, Decreased strength, Difficulty walking, Dizziness, Impaired sensation, Impaired vision/preception, Pain  Visit Diagnosis: 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 Elevated LDL cholesterol level 07/11/2017  . [employer] (NIH) Stroke Scale limb ataxia score 2, ataxia present in two limbs 06/25/2017   . 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 Vitamin D deficiency 05/29/2017  . History of tobacco abuse-  30pk yr hx - quit 04/07/17 05/21/2017  . Gait disturbance, post-stroke 05/14/2017  . Benign essential HTN   . Vitreous hemorrhage of right eye ([name])   . Adjustment disorder with mixed anxiety and depressed mood   . 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 SAH (subarachnoid hemorrhage) ([name]) 04/19/2017  . Basilar artery aneurysm ([name])   . Hypoxia   . 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 CVA (cerebral vascular accident) ([name]) 04/07/2017  . Elevated gastrin level 01/25/2012  . Hypokalemia 01/21/2012  . Nausea & vomiting 01/20/2012  . Epigastric pain 01/20/2012  . Duodenal ulcer, acute with obstruction 10/17/2011  . S/P laparoscopic cholecystectomy 10/16/2011    Harriet Butte, PT, MPT [facility] [address] [place], [place], [address] Phone: [phone]   Fax:  [phone] 07/29/17, 8:25 PM  Name: [name] Barnett MRN: [id number] Date of Birth: August 22, 1976

## 2017-07-31 ENCOUNTER — Ambulatory Visit: Payer: BLUE CROSS/BLUE SHIELD | Admitting: Physical Therapy

## 2017-07-31 ENCOUNTER — Ambulatory Visit: Payer: BLUE CROSS/BLUE SHIELD | Admitting: Occupational Therapy

## 2017-07-31 VITALS — BP 124/78

## 2017-07-31 DIAGNOSIS — R278 Other lack of coordination: Secondary | ICD-10-CM

## 2017-07-31 DIAGNOSIS — R208 Other disturbances of skin sensation: Secondary | ICD-10-CM

## 2017-07-31 DIAGNOSIS — I69054 Hemiplegia and hemiparesis following nontraumatic subarachnoid hemorrhage affecting left non-dominant side: Secondary | ICD-10-CM

## 2017-07-31 DIAGNOSIS — R260 Ataxic gait: Secondary | ICD-10-CM | POA: Diagnosis not present

## 2017-07-31 DIAGNOSIS — R42 Dizziness and giddiness: Secondary | ICD-10-CM

## 2017-07-31 DIAGNOSIS — R41840 Attention and concentration deficit: Secondary | ICD-10-CM

## 2017-07-31 DIAGNOSIS — R2689 Other abnormalities of gait and mobility: Secondary | ICD-10-CM

## 2017-07-31 DIAGNOSIS — R41842 Visuospatial deficit: Secondary | ICD-10-CM

## 2017-07-31 DIAGNOSIS — R2681 Unsteadiness on feet: Secondary | ICD-10-CM

## 2017-07-31 NOTE — Patient Instructions (Signed)
Gaze Stabilization: Sitting    Keeping eyes on target on wall 3 feet away, and move head side to side for 10 repetitions. Repeat while moving head up and down for 10 repetitions. Do __2-3__ sessions per day.  Copyright  VHI. All rights reserved.   Gaze Stabilization: Tip Card  1.Target must remain in focus, not blurry, and appear stationary while head is in motion. 2.Perform exercises with small head movements (45 to either side of midline). 3.Increase speed of head motion so long as target is in focus. 4.If you wear eyeglasses, be sure you can see target through lens (therapist will give specific instructions for bifocal / progressive lenses). 5.These exercises may provoke dizziness or nausea. Work through these symptoms. If too dizzy, slow head movement slightly. Rest between each exercise. 6.Exercises demand concentration; avoid distractions.  Copyright  VHI. All rights reserved.    Compensatory Strategies: Corrective Saccades    1. Holding two stationary targets placed _6___ inches apart, move eyes to target, keep head still. 2. Then move head in direction of target while eyes remain on target. 3/4. Repeat in opposite direction. Perform sitting. Repeat sequence ___10_ times per session. Perform 10 side to side, 10 up and down

## 2017-07-31 NOTE — Therapy (Signed)
[facility] [address] [place], [place], [address] Phone: [phone]   Fax:  [phone]  Occupational Therapy Treatment  Patient Details  Name: [name] Barnett MRN: [id number] Date of Birth: [birth year] Referring Provider: Dr. Claudette Laws  Encounter Date: 07/31/2017      OT End of Session - 07/31/17 1515    Visit Number 3   Number of Visits 17   Authorization Type BCBS, no visit limit/auth req.   OT Start Time 1450   OT Stop Time 1530   OT Time Calculation (min) 40 min   Activity Tolerance Patient tolerated treatment well   Behavior During Therapy Restless      Past Medical History:  Diagnosis Date  . Duodenal obstruction   . GERD (gastroesophageal reflux disease)   . Hypertension     Past Surgical History:  Procedure Laterality Date  . ABDOMINAL HYSTERECTOMY    . BALLOON DILATION  12/31/2011   Procedure: BALLOON DILATION;  Surgeon: Barrie Folk, MD;  Location: [facility] ENDOSCOPY;  Service: Endoscopy;  Laterality: N/A;  . CHOLECYSTECTOMY  07/28/11  . ESOPHAGOGASTRODUODENOSCOPY  10/17/2011   Procedure: ESOPHAGOGASTRODUODENOSCOPY (EGD);  Surgeon: Barrie Folk, MD;  Location: [facility] ENDOSCOPY;  Service: Endoscopy;  Laterality: N/A;  . IR ANGIO INTRA EXTRACRAN SEL INTERNAL CAROTID BILAT MOD SED  04/07/2017  . IR ANGIO VERTEBRAL SEL VERTEBRAL UNI R MOD SED  04/07/2017  . IR ANGIOGRAM FOLLOW UP STUDY  04/07/2017  . IR ANGIOGRAM FOLLOW UP STUDY  04/07/2017  . IR ANGIOGRAM FOLLOW UP STUDY  04/07/2017  . IR ANGIOGRAM FOLLOW UP STUDY  04/07/2017  . IR ANGIOGRAM FOLLOW UP STUDY  04/07/2017  . IR ANGIOGRAM SELECTIVE EACH ADDITIONAL VESSEL  04/07/2017  . IR TRANSCATH/EMBOLIZ  04/07/2017  . PARS PLANA VITRECTOMY Right 05/01/2017   Procedure: PARS PLANA VITRECTOMY WITH 25 GAUGE RIGHT EYE, endolaser photocoaglation;  Surgeon: Carmela Rima, MD;  Location: [facility] OR;  Service: Ophthalmology;  Laterality: Right;  . RADIOLOGY WITH ANESTHESIA  N/A 04/07/2017   Procedure: RADIOLOGY WITH ANESTHESIA;  Surgeon: Lisbeth Renshaw, MD;  Location: [facility] OR;  Service: Radiology;  Laterality: N/A;  . REPLACEMENT TOTAL KNEE BILATERAL  2004  . TEMPOROMANDIBULAR JOINT SURGERY     2 surgeries  . TUMOR REMOVAL    . VAGOTOMY  01/23/2012   Procedure: VAGOTOMY, antrectomy and BII;  Surgeon: Currie Paris, MD;  Location: [facility] OR;  Service: General;  Laterality: N/A;  Laparotomy with vagotomy.    Vitals:   07/31/17 1455  BP: 124/78        Subjective Assessment - 07/31/17 1514    Pertinent History Subarachnoid hemorrhage due to ruptured aneurysm, ataxia (L cerebellar); HTN, R side decr sensation, vertical diplopia/visual deficits, ataxia, Bilateral retinal hemorhage associated with SAH (with surgery on R eye in hospital and L approx 1 month ago)   Limitations visual deficits, fall risk, impulsivity/cognitive deficits   Patient Stated Goals be able to walk and use L arm   Currently in Pain? Yes   Pain Score 5    Pain Location Leg   Pain Orientation Right   Pain Descriptors / Indicators Aching;Pins and needles   Pain Type Acute pain   Pain Onset 1 to 4 weeks ago   Pain Frequency Constant   Aggravating Factors  unknown   Pain Relieving Factors movement   Multiple Pain Sites No                 Treatment:Pt ambulated from  waiting room to gym with walker and min A/ mod v.c to step with LLE,  Functional activities using LUE: stacking blocks, flipping playing cards, placing various sized pegs in semicircle, then playing connect four for left functional use with  Visual and cognitive component. Mod v.c for LUE functional use, speed and maintaining focus to task.              OT Short Term Goals - 07/16/17 1221      OT SHORT TERM GOAL #1   Title Pt/caregiver will be independent with initial HEP.--check STGs 08/14/17   Time 4   Period Weeks   Status New     OT SHORT TERM GOAL #2   Title Pt will be use LUE as nondominant  assist at least 50% of the time for ADLs without cueing.   Time 4   Period Weeks   Status New     OT SHORT TERM GOAL #3   Title Pt will improve LUE coordination/control to improve score on box and blocks test by at least 6 with LUE.   Baseline 19 blocks   Time 4   Period Weeks   Status New     OT SHORT TERM GOAL #4   Title Pt will perform simple home maintenance task/snack prep in sitting with set-up.   Time 4   Period Weeks   Status New     OT SHORT TERM GOAL #5   Title Pt/husband will verbalize understanding of visual compensation stategies for improved safety/incr ease with ADLs.   Time 4   Period Weeks   Status New           OT Long Term Goals - 07/16/17 1907      OT LONG TERM GOAL #1   Title Pt/caregiver will be independent with updated HEP.--check LTGs 09/13/17   Time 8   Period Weeks   Status New     OT LONG TERM GOAL #2   Title Pt will be use LUE as nondominant assist at least 75% of the time for ADLs without cueing.   Time 8   Period Weeks   Status New     OT LONG TERM GOAL #3   Title Pt will perform toileting mod I (including transfer).   Time 8   Period Weeks   Status New     OT LONG TERM GOAL #4   Title Pt will improve LUE coordination/control to improve score on box and blocks test by at least 12 with LUE.   Baseline 19 blocks   Time 8   Period Weeks   Status New     OT LONG TERM GOAL #5   Title Pt will perform simple home maintenance task/snack prep in standing with set-up/supervision.   Time 8   Period Weeks   Status New     OT LONG TERM GOAL #6   Title Pt will perform simple environmental scanning/navigation with supervision.   Time 8   Period Weeks   Status New               Plan - 07/31/17 1539    Clinical Impression Statement Pt is progressing towards goals. She requires v.c. to stay on task and to use LUE.   Rehab Potential Good   OT Frequency 2x / week   OT Duration 8 weeks   OT Treatment/Interventions Self-care/ADL  training;DME and/or AE instruction;Patient/family education;Therapeutic exercises;Balance training;Fluidtherapy;Moist Heat;Ultrasound;Therapeutic exercise;Therapeutic activities;Cryotherapy;Neuromuscular education;Functional Mobility Training;Passive range of motion;Cognitive remediation/compensation;Visual/perceptual remediation/compensation;Manual Therapy  Plan continue to address LUE functional use, left side awareness   Consulted and Agree with Plan of Care Patient;Family member/caregiver   Family Member Consulted husband      Patient will benefit from skilled therapeutic intervention in order to improve the following deficits and impairments:  Decreased coordination, Decreased range of motion, Difficulty walking, Abnormal gait, Decreased safety awareness, Impaired sensation, Decreased knowledge of precautions, Decreased balance, Decreased knowledge of use of DME, Impaired UE functional use, Decreased cognition, Decreased mobility, Decreased strength, Impaired vision/preception, Impaired perceived functional ability  Visit Diagnosis: 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 Elevated LDL cholesterol level 07/11/2017  . [employer] (NIH) Stroke Scale limb ataxia score 2, ataxia present in two limbs 06/25/2017  . 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 Vitamin D deficiency 05/29/2017  . History of tobacco abuse-  30pk yr hx - quit 04/07/17 05/21/2017  . Gait disturbance, post-stroke 05/14/2017  . Benign essential HTN   . Vitreous hemorrhage of right eye ([name])   . Adjustment disorder with mixed anxiety and depressed mood   . 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 SAH (subarachnoid hemorrhage) ([name]) 04/19/2017  . Basilar artery aneurysm ([name])   . Hypoxia   . 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 CVA (cerebral vascular accident) ([name]) 04/07/2017  . Elevated gastrin level 01/25/2012  . Hypokalemia 01/21/2012  . Nausea & vomiting 01/20/2012  . Epigastric pain 01/20/2012  . Duodenal ulcer, acute with obstruction 10/17/2011  . S/P laparoscopic cholecystectomy 10/16/2011    Celicia Minahan 07/31/2017, 3:40 PM Keene Breath, OTR/L [phone] Phone: [phone] 3:46 PM 07/31/17 [facility] [address] [place], [place], [address] Phone: [phone]   Fax:  [phone]  Name: [name] Barnett MRN: [id number] Date of Birth: 05/07/1976

## 2017-08-01 ENCOUNTER — Ambulatory Visit (INDEPENDENT_AMBULATORY_CARE_PROVIDER_SITE_OTHER): Payer: BLUE CROSS/BLUE SHIELD | Admitting: Family Medicine

## 2017-08-01 ENCOUNTER — Encounter: Payer: Self-pay | Admitting: Family Medicine

## 2017-08-01 VITALS — BP 116/76 | HR 93 | Ht 63.25 in | Wt 155.0 lb

## 2017-08-01 DIAGNOSIS — Z719 Counseling, unspecified: Secondary | ICD-10-CM

## 2017-08-01 DIAGNOSIS — M792 Neuralgia and neuritis, unspecified: Secondary | ICD-10-CM

## 2017-08-01 DIAGNOSIS — I639 Cerebral infarction, unspecified: Secondary | ICD-10-CM | POA: Diagnosis not present

## 2017-08-01 DIAGNOSIS — I10 Essential (primary) hypertension: Secondary | ICD-10-CM | POA: Diagnosis not present

## 2017-08-01 DIAGNOSIS — E7800 Pure hypercholesterolemia, unspecified: Secondary | ICD-10-CM

## 2017-08-01 DIAGNOSIS — I609 Nontraumatic subarachnoid hemorrhage, unspecified: Secondary | ICD-10-CM

## 2017-08-01 DIAGNOSIS — Z79899 Other long term (current) drug therapy: Secondary | ICD-10-CM

## 2017-08-01 DIAGNOSIS — Z87891 Personal history of nicotine dependence: Secondary | ICD-10-CM | POA: Diagnosis not present

## 2017-08-01 DIAGNOSIS — I725 Aneurysm of other precerebral arteries: Secondary | ICD-10-CM | POA: Diagnosis not present

## 2017-08-01 DIAGNOSIS — F4323 Adjustment disorder with mixed anxiety and depressed mood: Secondary | ICD-10-CM

## 2017-08-01 MED ORDER — AMLODIPINE BESYLATE 2.5 MG PO TABS: 2.5000 mg | ORAL_TABLET | Freq: Every day | ORAL | 1 refills | Status: DC

## 2017-08-01 MED ORDER — FLUOXETINE HCL 20 MG PO CAPS: 40.0000 mg | ORAL_CAPSULE | Freq: Every day | ORAL | 1 refills | Status: DC

## 2017-08-01 MED ORDER — VALSARTAN 40 MG PO TABS: 20.0000 mg | ORAL_TABLET | Freq: Every day | ORAL | 1 refills | Status: DC

## 2017-08-01 NOTE — Therapy (Signed)
[facility] [address] [place], [place], [address] Phone: [phone]   Fax:  [phone]  Physical Therapy Treatment  Patient Details  Name: [name] Barnett MRN: [id number] Date of Birth: October 22, 1976 Referring Provider: Erick Colace, MD  Encounter Date: 07/31/2017      PT End of Session - 08/01/17 1442    Visit Number 3   Number of Visits 17   Date for PT Re-Evaluation 09/13/17   Authorization Type BC[employer]   PT Start Time 1532   PT Stop Time 1615   PT Time Calculation (min) 43 min   Activity Tolerance Patient tolerated treatment well   Behavior During Therapy Restless      Past Medical History:  Diagnosis Date  . Duodenal obstruction   . GERD (gastroesophageal reflux disease)   . Hypertension     Past Surgical History:  Procedure Laterality Date  . ABDOMINAL HYSTERECTOMY    . BALLOON DILATION  12/31/2011   Procedure: BALLOON DILATION;  Surgeon: Barrie Folk, MD;  Location: [facility] ENDOSCOPY;  Service: Endoscopy;  Laterality: N/A;  . CHOLECYSTECTOMY  07/28/11  . ESOPHAGOGASTRODUODENOSCOPY  10/17/2011   Procedure: ESOPHAGOGASTRODUODENOSCOPY (EGD);  Surgeon: Barrie Folk, MD;  Location: [facility] ENDOSCOPY;  Service: Endoscopy;  Laterality: N/A;  . IR ANGIO INTRA EXTRACRAN SEL INTERNAL CAROTID BILAT MOD SED  04/07/2017  . IR ANGIO VERTEBRAL SEL VERTEBRAL UNI R MOD SED  04/07/2017  . IR ANGIOGRAM FOLLOW UP STUDY  04/07/2017  . IR ANGIOGRAM FOLLOW UP STUDY  04/07/2017  . IR ANGIOGRAM FOLLOW UP STUDY  04/07/2017  . IR ANGIOGRAM FOLLOW UP STUDY  04/07/2017  . IR ANGIOGRAM FOLLOW UP STUDY  04/07/2017  . IR ANGIOGRAM SELECTIVE EACH ADDITIONAL VESSEL  04/07/2017  . IR TRANSCATH/EMBOLIZ  04/07/2017  . PARS PLANA VITRECTOMY Right 05/01/2017   Procedure: PARS PLANA VITRECTOMY WITH 25 GAUGE RIGHT EYE, endolaser photocoaglation;  Surgeon: Carmela Rima, MD;  Location: [facility] OR;  Service: Ophthalmology;  Laterality: Right;  . RADIOLOGY WITH  ANESTHESIA N/A 04/07/2017   Procedure: RADIOLOGY WITH ANESTHESIA;  Surgeon: Lisbeth Renshaw, MD;  Location: [facility] OR;  Service: Radiology;  Laterality: N/A;  . REPLACEMENT TOTAL KNEE BILATERAL  2004  . TEMPOROMANDIBULAR JOINT SURGERY     2 surgeries  . TUMOR REMOVAL    . VAGOTOMY  01/23/2012   Procedure: VAGOTOMY, antrectomy and BII;  Surgeon: Currie Paris, MD;  Location: [facility] OR;  Service: General;  Laterality: N/A;  Laparotomy with vagotomy.    There were no vitals filed for this visit.      Subjective Assessment - 07/31/17 1537    Subjective Continues to have bruising on L side after fall on Sunday.  Pt states, I will never do that again!   Patient is accompained by: Family member   Limitations Standing;Walking   Currently in Pain? Yes   Pain Score 5    Pain Location Leg   Pain Orientation Right   Pain Descriptors / Indicators Pins and needles   Pain Type Acute pain                Vestibular Assessment - 07/31/17 1538      Vestibular Assessment   General Observation head tilted to R, veers to R during standing and ambulation.     Symptom Behavior   Type of Dizziness "Funny feeling in head"   Frequency of Dizziness constant   Duration of Dizziness constant   Aggravating Factors Activity in general  Relieving Factors Comments  sitting fully supported     Occulomotor Exam   Occulomotor Alignment Abnormal   Spontaneous Absent   Gaze-induced Absent   Smooth Pursuits Saccades   Saccades Poor trajectory;Slow;Comment  dyscongugate eye movement, L eye overshoots   Comment convergence impaired.  + test of skew     Vestibulo-Occular Reflex   VOR 1 Head Only (x 1 viewing) reports moderate dizziness   VOR to Slow Head Movement Positive left   Comment HIT: + to L with 2 beats R beating nystagmus                 [facility] Adult PT Treatment/Exercise - 07/31/17 1433      Ambulation/Gait   Ambulation/Gait Yes   Ambulation/Gait Assistance 4: Min assist    Ambulation/Gait Assistance Details Pt interested in use of rollator RW for home use to improve efficiency of gait and to have a seat to sit on in the kitchen with family during meal prep.  Pt demonstrated improved efficiency of gait especially with changes in direction but continued to require assistance to maintain safe distance to rollator, for full step and stride length, and to widen BOS.   Ambulation Distance (Feet) 100 Feet   Assistive device 4-wheeled walker   Gait Pattern Step-to pattern;Step-through pattern;Decreased step length - right;Decreased step length - left;Decreased stride length;Decreased hip/knee flexion - right;Decreased hip/knee flexion - left;Ataxic;Decreased trunk rotation;Narrow base of support   Ambulation Surface Level;Indoor         Vestibular Treatment/Exercise - 07/31/17 1553      Vestibular Treatment/Exercise   Gaze Exercises X1 Viewing Horizontal;X1 Viewing Vertical;Eye/Head Exercise Horizontal;Eye/Head Exercise Vertical     X1 Viewing Horizontal   Foot Position seated   Reps 2   Comments 2 sets x 10 head turns while maintaining head in neutral in roll plane and trunk in midline; focus on slowing movement to allow eyes to remain focused on target     X1 Viewing Vertical   Foot Position seated   Reps 2   Comments 2 sets x 10 head nods while maintaining head in neutral in roll plane and trunk in midline; focus on slowing movement to allow eyes to remain focused on target     Eye/Head Exercise Horizontal   Foot Position seated   Reps 10   Comments target 6 inches apart     Eye/Head Exercise Vertical   Foot Position seated   Reps 10   Comments target 6 inches apart               PT Education - 08/01/17 1441    Education provided Yes   Education Details vestibular clinical findings, HEP   Person(s) Educated Patient;Spouse   Methods Explanation;Demonstration;Handout   Comprehension Verbalized understanding;Returned demonstration           PT Short Term Goals - 08/01/17 1447      PT SHORT TERM GOAL #1   Title Pt will participate in vestibular assessment and assessment of gait velocity with LTG to be set    Time 4   Period Weeks   Status Achieved     PT SHORT TERM GOAL #2   Title Pt will improve balance and midline orientation to perform sit <> stand with supervision and be able to maintain static standing x 2 minutes without UE support and no LOB to R   Time 4   Period Weeks   Status New   Target Date 08/14/17  PT SHORT TERM GOAL #3   Title Pt will decrease falls risk as indicated by increase in BERG balance score to > or = 22/56   Baseline 17/56   Time 4   Period Weeks   Status New   Target Date 08/14/17     PT SHORT TERM GOAL #4   Title Pt will ambulate x 150' on indoor surfaces with LRAD and min A and require <10% assistance to maintain AD in straight path and <10% verbal cues for gait pattern.   Time 4   Period Weeks   Status New   Target Date 08/14/17           PT Long Term Goals - 08/01/17 1447      PT LONG TERM GOAL #1   Title Pt and husband will demonstrate independence with HEP   Time 8   Period Weeks   Status New   Target Date 09/13/17     PT LONG TERM GOAL #2   Title Pt will demonstrate improved balance and decreased falls risk as indicated by BERG balance score of > or = 27/56   Time 8   Period Weeks   Status New   Target Date 09/13/17     PT LONG TERM GOAL #3   Title Pt will ambulate with LRAD x 300' over level indoor and paved outdoor surfaces with supervision and intermittent cues for gait sequence   Time 8   Period Weeks   Status New   Target Date 09/13/17     PT LONG TERM GOAL #4   Title Pt will report 15% improvement in Neuro QOL-LE   Baseline 31.3%   Time 8   Period Weeks   Status New   Target Date 09/13/17     PT LONG TERM GOAL #5   Title Pt will decrease falls risk during gait in home/community as indicated by increase in gait velocity to > or = 1.38ft/sec  with LRAD   Baseline .86 ft/sec with RW and up to mod A on 07/29/17   Time 8   Period Weeks   Status New   Target Date 09/13/17     PT LONG TERM GOAL #6   Title Pt will report <2/10 dizziness with head movements and will demonstrate improved gaze stability as indicated by ability to perform x 1 viewing HEP x 60 seconds in each direction   Time 8   Period Weeks   Status Revised   Target Date 09/13/17               Plan - 08/01/17 1442    Clinical Impression Statement Treatment session focused on continued assessment of visual and vestibular systems.  Pt presents with significant oculomotor impairments and coordination of eye movement, impaired VOR, impaired subjective vertical and central vertigo.  Pt provided with and guided through initial vestibular habituation exercises with pt reporting mild-moderate dizziness with x1 viewing and compensatory saccade training.  Also performed initial gait assessment with rollator for home use; will continue to assess and train.  Will continue to progress towards LTG.   Rehab Potential Good   PT Frequency 2x / week   PT Duration 8 weeks   PT Treatment/Interventions ADLs/Self Care Home Management;Electrical Stimulation;DME Instruction;Gait training;Stair training;Functional mobility training;Therapeutic activities;Therapeutic exercise;Balance training;Neuromuscular re-education;Cognitive remediation;Patient/family education;Orthotic Fit/Training;Vestibular;Visual/perceptual remediation/compensation   PT Next Visit Plan ASSESS VITALS EACH VISIT; review vestibular HEP and progress if able, GAIT TRAINING WITH ROLLATOR, NMR and balance training: weight shifting to L, WB  for proprioceptive feedback, coordination   Consulted and Agree with Plan of Care Patient;Family member/caregiver   Family Member Consulted husband      Patient will benefit from skilled therapeutic intervention in order to improve the following deficits and impairments:  Abnormal  gait, Decreased balance, Decreased cognition, Decreased coordination, Decreased strength, Difficulty walking, Dizziness, Impaired sensation, Impaired vision/preception, Pain  Visit Diagnosis: Ataxic gait  Other lack of coordination  Other abnormalities of gait and mobility  Unsteadiness on feet  Dizziness and giddiness     Problem List Patient Active Problem List   Diagnosis Date Noted  . Elevated LDL cholesterol level 07/11/2017  . [employer] (NIH) Stroke Scale limb ataxia score 2, ataxia present in two limbs 06/25/2017  . 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 Vitamin D deficiency 05/29/2017  . History of tobacco abuse-  30pk yr hx - quit 04/07/17 05/21/2017  . Gait disturbance, post-stroke 05/14/2017  . Benign essential HTN   . Vitreous hemorrhage of right eye ([name])   . Adjustment disorder with mixed anxiety and depressed mood   . 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 SAH (subarachnoid hemorrhage) ([name]) 04/19/2017  . Basilar artery aneurysm ([name])   . Hypoxia   . 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 CVA (cerebral vascular accident) ([name]) 04/07/2017  . Elevated gastrin level 01/25/2012  . Hypokalemia 01/21/2012  . Nausea & vomiting 01/20/2012  . Epigastric pain 01/20/2012  . Duodenal ulcer, acute with obstruction 10/17/2011  . S/P laparoscopic cholecystectomy 10/16/2011   Edman Circle, PT, DPT 08/01/17    2:53 PM    [facility] [address] [place], [place], [address] Phone: [phone]   Fax:  [phone]  Name: [name] Barnett MRN: [id number] Date of Birth: 07/17/1976

## 2017-08-01 NOTE — Patient Instructions (Addendum)
-   Continue your cholesterol meds nightly.  But you need to increase her water intake to half of your weight in ounces of water per day.   77.5 oz water/day  - 4 your blood pressure, we are going to keep the valsartan at still one half a tablet of the 40 mg daily.  We sent  new prescription out   - for your blood pressure we did decrease your amlodipine by one half.  The new prescription will be one full pill of the 2.5 mg which is half of the 5mg  that you were taking prior.    - Please continue to monitor your blood pressure at home and let me know if you're not at goal of less than 130/80 on a regular basis.     -For your mood, with a double you're Prozac\fluoxetine to 40 mg daily.  This means she will take 2 capsules daily.     - Please talk to your neurologist about nerve pain medicine for that right arm and right leg now that your nerve is "waking up ".   Thank you for your time and patience today.  I hope you found your office visit with me encouraging and educational, as my goal is to help my patients become healthier and happier.    Please note that you may receive a survey via email or snail mail about your experience today.  Please feel free to be open and honest about your experiences with [place], as your input is valuable to [place] and will help [place] to improve our care and services.  Otherwise, please feel free to do a google review on [facility] or go to [email or web address] and click on the FACEBOOK link to leave your thoughts.  Thank you for your input and in allowing [place] to care for you today!    -  Dr Sharee Holster

## 2017-08-01 NOTE — Progress Notes (Signed)
Impression and Recommendations:    1. Adjustment disorder with mixed anxiety and depressed mood   2. Neuritis-  R sided:  arm and leg/ body due to stroke   3. HTN, goal below 130/80   4. Elevated LDL cholesterol level   5. High risk medications (not anticoagulants) long-term use   6. Health education/counseling   7. s/p Cerebrovascular accident (CVA), unspecified mechanism ([name])   8. History of tobacco abuse-  30pk yr hx - quit 04/07/17   9. s/p Basilar artery aneurysm ([name])   10. s/p SAH (subarachnoid hemorrhage) ([name])    - Continue your cholesterol meds nightly.  Discussed with patient importance of low saturated and Transfats diet as well as lifestyle modification and increasing activity levels.  - But you need to increase her water intake to half of your weight in ounces of water per day.   77.5 oz water/day  - For your blood pressure, we are going to keep the valsartan at still one half a tablet of the 40 mg daily.  We sent  new prescription out for this  - Also for your blood pressure- today we did decrease your amlodipine by one half.   The new prescription will be one full pill of the 2.5 mg which is half of the 5mg  that you were taking prior.    - Please continue to monitor your blood pressure at home and let me know if you're not at goal of less than 130/80 on a regular basis.    -For your mood, we are doubling you're Prozac\ fluoxetine to 40 mg daily.   This means she will take 2 capsules daily.   - Please talk to your neurologist about nerve pain medicine for that right arm and right leg now that your nerve is "waking up ".    Education and routine counseling performed. Handouts provided.   Return after we dec dose of BP med and inc prozac dose, for --> Five-week follow-up-will get  ALT due to starting statin, and BMP at that time.  The patient was counseled, risk factors were discussed, anticipatory guidance given.  Gross side effects, risk and benefits, and  alternatives of medications discussed with patient.  Patient is aware that all medications have potential side effects and we are unable to predict every side effect or drug-drug interaction that may occur.  Expresses verbal understanding and consents to current therapy plan and treatment regimen.  Patient brought home BP monitor into the office today.  We checked her machine and compared to the readings on both of the machines in our office. The BP monitors in office where both reading same as to what is documented in her vitals.  Patient's home machine is reading high with a reading that was elevated from the reading done on our monitors before and after the reading on her machine.  Patient was advised to check the manual to see if her's can be calibrated or to contact the place that she got the machine and see if they can get a new one since they have just gotten the monitor.   Please see AVS handed out to patient at the end of our visit for further patient instructions/ counseling done pertaining to today's office visit.    Note: This document was prepared using Dragon voice recognition software and may include unintentional dictation errors.     Subjective:    Chief Complaint  Patient presents with  . Follow-up    HPI:  [name] Barnett is a 41 y.o. female who presents to [facility] today for follow up for mood- started prozac last OV and a Statin for chol.   Dizziness:  Stable, no worse and hasn't changed much.  Mood:  Started Prozac last office visit.  Patient tolerating well.  Can't tell a tremendous difference.  Cholesterol:  Started her on 20 mg Lipitor last office visit.  Tolerating well.  - Status post stroke- now neuropathic sx: She's been having some tingling and zinging sensations in the right side of her body in the right upper arm and lower extremity.  She feels her "numbness is going away" and her "nerves are awakening" ever since her stroke.   It is causing some restless leg type symptoms and it very irritating throughout the entire day and into the evening.  Not worse at night.    Since starting the Prozac and Lipitor-she has no symptoms on the left side of her body of muscle aches or restless leg type symptoms.  HTN: -Patient is tolerating the half a tablet of valsartan and well.  Blood pressures at home have been 115-130\ 70s.  No increase in her dizziness symptoms.  Feeling well.  Walking on treadmill- or so daily.     Today their BP is BP: 116/76   Last 3 blood pressure readings in our office are as follows: BP Readings from Last 3 Encounters:  08/01/17 116/76  07/31/17 124/78  07/15/17 128/87    Pulse Readings from Last 3 Encounters:  08/01/17 93  07/15/17 77  07/11/17 (!) 120    Filed Weights   08/01/17 1551  Weight: 155 lb (70.3 kg)      Patient Care Team    Relationship Specialty Notifications Start End  Thomasene Lot, DO PCP - General Family Medicine  04/30/17   Charlott Rakes, MD Consulting Physician Gastroenterology  10/17/11   Lisbeth Renshaw, MD Consulting Physician Neurosurgery  05/21/17   Erick Colace, MD Consulting Physician Physical Medicine and Rehabilitation  05/21/17   Olivia Mackie, MD Consulting Physician Obstetrics and Gynecology  05/21/17   Carmela Rima, MD Consulting Physician Ophthalmology  06/13/17    Comment: Neuro ophthalmologist     Lab Results  Component Value Date   CREATININE 0.65 05/21/2017   BUN 8 05/21/2017   NA 143 05/21/2017   K 4.5 05/21/2017   CL 102 05/21/2017   CO2 23 05/21/2017    Lab Results  Component Value Date   CHOL 213 (H) 05/21/2017    Lab Results  Component Value Date   HDL 74 05/21/2017    Lab Results  Component Value Date   LDLCALC 111 (H) 05/21/2017    Lab Results  Component Value Date   TRIG 138 05/21/2017    Lab Results  Component Value Date   CHOLHDL 2.9 05/21/2017    No results found for:  LDLDIRECT ===================================================================   Patient Active Problem List   Diagnosis Date Noted  . Benign essential HTN     Priority: High  . HTN, goal below 130/80 08/05/2017  . Health education/counseling 08/05/2017  . High risk medications (not anticoagulants) long-term use 08/05/2017  . Neuritis-  R sided:  arm and leg/ body due to stroke 08/05/2017  . Elevated LDL cholesterol level 07/11/2017  . [employer] (NIH) Stroke Scale limb ataxia score 2, ataxia present in two limbs 06/25/2017  . 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  Vitamin D deficiency 05/29/2017  . History of tobacco abuse-  30pk yr hx - quit 04/07/17 05/21/2017  . Gait disturbance, post-stroke 05/14/2017  . Vitreous hemorrhage of right eye ([name])   . Adjustment disorder with mixed anxiety and depressed mood   . 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 SAH (subarachnoid hemorrhage) ([name]) 04/19/2017  . Basilar artery aneurysm ([name])   . Hypoxia   . 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 CVA (cerebral vascular accident) ([name]) 04/07/2017  . Elevated gastrin level 01/25/2012  . Hypokalemia 01/21/2012  . Nausea & vomiting 01/20/2012  . Epigastric pain 01/20/2012  . Duodenal ulcer, acute with obstruction 10/17/2011  . S/P laparoscopic cholecystectomy 10/16/2011     Past Medical History:  Diagnosis Date  . Duodenal obstruction   . GERD (gastroesophageal reflux disease)   . Hypertension      Past Surgical History:  Procedure Laterality Date  . ABDOMINAL HYSTERECTOMY    . BALLOON DILATION  12/31/2011   Procedure: BALLOON DILATION;  Surgeon: Barrie Folk, MD;  Location: [facility] ENDOSCOPY;  Service: Endoscopy;  Laterality: N/A;  . CHOLECYSTECTOMY  07/28/11  . ESOPHAGOGASTRODUODENOSCOPY  10/17/2011   Procedure: ESOPHAGOGASTRODUODENOSCOPY (EGD);   Surgeon: Barrie Folk, MD;  Location: [facility] ENDOSCOPY;  Service: Endoscopy;  Laterality: N/A;  . IR ANGIO INTRA EXTRACRAN SEL INTERNAL CAROTID BILAT MOD SED  04/07/2017  . IR ANGIO VERTEBRAL SEL VERTEBRAL UNI R MOD SED  04/07/2017  . IR ANGIOGRAM FOLLOW UP STUDY  04/07/2017  . IR ANGIOGRAM FOLLOW UP STUDY  04/07/2017  . IR ANGIOGRAM FOLLOW UP STUDY  04/07/2017  . IR ANGIOGRAM FOLLOW UP STUDY  04/07/2017  . IR ANGIOGRAM FOLLOW UP STUDY  04/07/2017  . IR ANGIOGRAM SELECTIVE EACH ADDITIONAL VESSEL  04/07/2017  . IR TRANSCATH/EMBOLIZ  04/07/2017  . PARS PLANA VITRECTOMY Right 05/01/2017   Procedure: PARS PLANA VITRECTOMY WITH 25 GAUGE RIGHT EYE, endolaser photocoaglation;  Surgeon: Carmela Rima, MD;  Location: [facility] OR;  Service: Ophthalmology;  Laterality: Right;  . RADIOLOGY WITH ANESTHESIA N/A 04/07/2017   Procedure: RADIOLOGY WITH ANESTHESIA;  Surgeon: Lisbeth Renshaw, MD;  Location: [facility] OR;  Service: Radiology;  Laterality: N/A;  . REPLACEMENT TOTAL KNEE BILATERAL  2004  . TEMPOROMANDIBULAR JOINT SURGERY     2 surgeries  . TUMOR REMOVAL    . VAGOTOMY  01/23/2012   Procedure: VAGOTOMY, antrectomy and BII;  Surgeon: Currie Paris, MD;  Location: [facility] OR;  Service: General;  Laterality: N/A;  Laparotomy with vagotomy.     Family History  Problem Relation Age of Onset  . Hypertension Mother   . Restless legs syndrome Mother   . Hyperlipidemia Mother   . Heart disease Father   . Malignant hyperthermia Neg Hx      History  Drug Use No  ,  History  Alcohol Use No  ,  History  Smoking Status  . Former Smoker  . Packs/day: 1.00  . Years: 20.00  . Types: Cigarettes  . Quit date: 04/07/2017  Smokeless Tobacco  . Never Used  ,    Current Outpatient Prescriptions on File Prior to Visit  Medication Sig Dispense Refill  . atorvastatin (LIPITOR) 20 MG tablet Take 1 tablet (20 mg total) by mouth at bedtime. 90 tablet 3  . Cholecalciferol (VITAMIN D3) 5000 units TABS 5,000 IU OTC vitamin D3  daily. 90 tablet 3  . Vitamin D, Ergocalciferol, (DRISDOL)  50000 units CAPS capsule Take 1 capsule (50,000 Units total) by mouth every 7 (seven) days. 12 capsule 10   No current facility-administered medications on file prior to visit.      Allergies  Allergen Reactions  . Nsaids Other (See Comments)    Pt diagnosed with near-obstructing circumferential ulcer of duodenum [id number]     Review of Systems:   General:  Denies fever, chills Optho/Auditory:   Denies visual changes, blurred vision Respiratory:   Denies SOB, cough, wheeze, DIB  Cardiovascular:   Denies chest pain, palpitations, painful respirations Gastrointestinal:   Denies nausea, vomiting, diarrhea.  Endocrine:     Denies new hot or cold intolerance Musculoskeletal:  Denies joint swelling, gait issues, or new unexplained myalgias/ arthralgias Skin:  Denies rash, suspicious lesions  Neurological:    Denies new onset of dizziness sx, unexplained weakness, numbness - no change in neuro sx Psychiatric/Behavioral:   Denies mood changes  Objective:    Blood pressure 116/76, pulse 93, height 5' 3.25" (1.607 m), weight 155 lb (70.3 kg), last menstrual period 07/11/2017.  Body mass index is 27.24 kg/m.  General: Well Developed, well nourished, and in no acute distress.  HEENT: Normocephalic, atraumatic, pupils equal round reactive to light, neck supple, No carotid bruits, no JVD Skin: Warm and dry, cap RF less 2 sec Cardiac: Regular rate and rhythm, S1, S2 WNL's, no murmurs rubs or gallops Respiratory: ECTA B/L, Not using accessory muscles, speaking in full sentences. NeuroM-Sk: Ambulates w/ assistance, moves ext * 4 w/o difficulty, sensation grossly intact.  Ext: scant edema b/l lower ext Psych: No HI/SI, judgement and insight good, Euthymic mood. Full Affect.

## 2017-08-05 ENCOUNTER — Encounter: Payer: Self-pay | Admitting: Rehabilitation

## 2017-08-05 ENCOUNTER — Ambulatory Visit: Payer: BLUE CROSS/BLUE SHIELD | Admitting: Rehabilitation

## 2017-08-05 ENCOUNTER — Ambulatory Visit: Payer: BLUE CROSS/BLUE SHIELD | Admitting: Occupational Therapy

## 2017-08-05 VITALS — BP 108/78

## 2017-08-05 DIAGNOSIS — M792 Neuralgia and neuritis, unspecified: Secondary | ICD-10-CM | POA: Insufficient documentation

## 2017-08-05 DIAGNOSIS — R208 Other disturbances of skin sensation: Secondary | ICD-10-CM

## 2017-08-05 DIAGNOSIS — Z719 Counseling, unspecified: Secondary | ICD-10-CM | POA: Insufficient documentation

## 2017-08-05 DIAGNOSIS — R41842 Visuospatial deficit: Secondary | ICD-10-CM

## 2017-08-05 DIAGNOSIS — R260 Ataxic gait: Secondary | ICD-10-CM

## 2017-08-05 DIAGNOSIS — R278 Other lack of coordination: Secondary | ICD-10-CM

## 2017-08-05 DIAGNOSIS — R2681 Unsteadiness on feet: Secondary | ICD-10-CM

## 2017-08-05 DIAGNOSIS — R2689 Other abnormalities of gait and mobility: Secondary | ICD-10-CM

## 2017-08-05 DIAGNOSIS — R42 Dizziness and giddiness: Secondary | ICD-10-CM

## 2017-08-05 DIAGNOSIS — R41840 Attention and concentration deficit: Secondary | ICD-10-CM

## 2017-08-05 DIAGNOSIS — Z79899 Other long term (current) drug therapy: Secondary | ICD-10-CM | POA: Insufficient documentation

## 2017-08-05 NOTE — Therapy (Signed)
[facility] [address] [place], [place], [address] Phone: [phone]   Fax:  [phone]  Occupational Therapy Treatment  Patient Details  Name: [name] Barnett MRN: [id number] Date of Birth: [birth year] Referring Provider: Dr. Claudette Laws  Encounter Date: 08/05/2017      OT End of Session - 08/05/17 1650    Visit Number 4   Number of Visits 17   Date for OT Re-Evaluation 09/13/17   Authorization Type [employer], no visit limit/auth req.   OT Start Time 1450   OT Stop Time 1530   OT Time Calculation (min) 40 min   Activity Tolerance Patient tolerated treatment well   Behavior During Therapy WFL for tasks assessed/performed      Past Medical History:  Diagnosis Date  . Duodenal obstruction   . GERD (gastroesophageal reflux disease)   . Hypertension     Past Surgical History:  Procedure Laterality Date  . ABDOMINAL HYSTERECTOMY    . BALLOON DILATION  12/31/2011   Procedure: BALLOON DILATION;  Surgeon: Barrie Folk, MD;  Location: [facility] ENDOSCOPY;  Service: Endoscopy;  Laterality: N/A;  . CHOLECYSTECTOMY  07/28/11  . ESOPHAGOGASTRODUODENOSCOPY  10/17/2011   Procedure: ESOPHAGOGASTRODUODENOSCOPY (EGD);  Surgeon: Barrie Folk, MD;  Location: [facility] ENDOSCOPY;  Service: Endoscopy;  Laterality: N/A;  . IR ANGIO INTRA EXTRACRAN SEL INTERNAL CAROTID BILAT MOD SED  04/07/2017  . IR ANGIO VERTEBRAL SEL VERTEBRAL UNI R MOD SED  04/07/2017  . IR ANGIOGRAM FOLLOW UP STUDY  04/07/2017  . IR ANGIOGRAM FOLLOW UP STUDY  04/07/2017  . IR ANGIOGRAM FOLLOW UP STUDY  04/07/2017  . IR ANGIOGRAM FOLLOW UP STUDY  04/07/2017  . IR ANGIOGRAM FOLLOW UP STUDY  04/07/2017  . IR ANGIOGRAM SELECTIVE EACH ADDITIONAL VESSEL  04/07/2017  . IR TRANSCATH/EMBOLIZ  04/07/2017  . PARS PLANA VITRECTOMY Right 05/01/2017   Procedure: PARS PLANA VITRECTOMY WITH 25 GAUGE RIGHT EYE, endolaser photocoaglation;  Surgeon: Carmela Rima, MD;  Location: [facility] OR;  Service:  Ophthalmology;  Laterality: Right;  . RADIOLOGY WITH ANESTHESIA N/A 04/07/2017   Procedure: RADIOLOGY WITH ANESTHESIA;  Surgeon: Lisbeth Renshaw, MD;  Location: [facility] OR;  Service: Radiology;  Laterality: N/A;  . REPLACEMENT TOTAL KNEE BILATERAL  2004  . TEMPOROMANDIBULAR JOINT SURGERY     2 surgeries  . TUMOR REMOVAL    . VAGOTOMY  01/23/2012   Procedure: VAGOTOMY, antrectomy and BII;  Surgeon: Currie Paris, MD;  Location: [facility] OR;  Service: General;  Laterality: N/A;  Laparotomy with vagotomy.    Vitals:   08/05/17 1456  BP: 108/78        Subjective Assessment - 08/05/17 1457    Subjective  Pain in right arm and leg   Pertinent History Subarachnoid hemorrhage due to ruptured aneurysm, ataxia (L cerebellar); HTN, R side decr sensation, vertical diplopia/visual deficits, ataxia, Bilateral retinal hemorhage associated with SAH (with surgery on R eye in hospital and L approx 1 month ago)   Limitations visual deficits, fall risk, impulsivity/cognitive deficits   Patient Stated Goals be able to walk and use L arm   Currently in Pain? Yes   Pain Score 5    Pain Location Arm  and leg, right side   Pain Orientation Right   Pain Descriptors / Indicators Pins and needles   Pain Type Acute pain   Pain Onset More than a month ago   Pain Frequency Constant   Aggravating Factors  unknown   Pain Relieving Factors unknown  Multiple Pain Sites No                  Treatment: Pt completed a 12 piece puzzle with only min v.c  Standing at countertop to retrieve items out of overhead cabinet  With LUE then RUE, wooden block under RLE to encourage weight shift to left, min -mod faciliation/ v.c Grasp / release of graded clothespins with LUE for yellow and red, mod difficulty/ v.c Picking up coins with left hand to place in container mod v.c/ max difficulty.              OT Short Term Goals - 07/16/17 1221      OT SHORT TERM GOAL #1   Title Pt/caregiver will be  independent with initial HEP.--check STGs 08/14/17   Time 4   Period Weeks   Status New     OT SHORT TERM GOAL #2   Title Pt will be use LUE as nondominant assist at least 50% of the time for ADLs without cueing.   Time 4   Period Weeks   Status New     OT SHORT TERM GOAL #3   Title Pt will improve LUE coordination/control to improve score on box and blocks test by at least 6 with LUE.   Baseline 19 blocks   Time 4   Period Weeks   Status New     OT SHORT TERM GOAL #4   Title Pt will perform simple home maintenance task/snack prep in sitting with set-up.   Time 4   Period Weeks   Status New     OT SHORT TERM GOAL #5   Title Pt/husband will verbalize understanding of visual compensation stategies for improved safety/incr ease with ADLs.   Time 4   Period Weeks   Status New           OT Long Term Goals - 07/16/17 1907      OT LONG TERM GOAL #1   Title Pt/caregiver will be independent with updated HEP.--check LTGs 09/13/17   Time 8   Period Weeks   Status New     OT LONG TERM GOAL #2   Title Pt will be use LUE as nondominant assist at least 75% of the time for ADLs without cueing.   Time 8   Period Weeks   Status New     OT LONG TERM GOAL #3   Title Pt will perform toileting mod I (including transfer).   Time 8   Period Weeks   Status New     OT LONG TERM GOAL #4   Title Pt will improve LUE coordination/control to improve score on box and blocks test by at least 12 with LUE.   Baseline 19 blocks   Time 8   Period Weeks   Status New     OT LONG TERM GOAL #5   Title Pt will perform simple home maintenance task/snack prep in standing with set-up/supervision.   Time 8   Period Weeks   Status New     OT LONG TERM GOAL #6   Title Pt will perform simple environmental scanning/navigation with supervision.   Time 8   Period Weeks   Status New               Plan - 08/05/17 1650    Clinical Impression Statement Pt is progressing towards goals. She  demonstrates improving mobility and LUE functional use following repetition.   OT Frequency 2x / week  OT Duration 8 weeks   OT Treatment/Interventions Self-care/ADL training;DME and/or AE instruction;Patient/family education;Therapeutic exercises;Balance training;Fluidtherapy;Moist Heat;Ultrasound;Therapeutic exercise;Therapeutic activities;Cryotherapy;Neuromuscular education;Functional Mobility Training;Passive range of motion;Cognitive remediation/compensation;Visual/perceptual remediation/compensation;Manual Therapy   Plan L side awareness, LUe functional use, visual perceptual skills   Consulted and Agree with Plan of Care Patient;Family member/caregiver   Family Member Consulted husband      Patient will benefit from skilled therapeutic intervention in order to improve the following deficits and impairments:  Decreased coordination, Decreased range of motion, Difficulty walking, Abnormal gait, Decreased safety awareness, Impaired sensation, Decreased knowledge of precautions, Decreased balance, Decreased knowledge of use of DME, Impaired UE functional use, Decreased cognition, Decreased mobility, Decreased strength, Impaired vision/preception, Impaired perceived functional ability  Visit Diagnosis: Other lack of coordination  Other disturbances of skin sensation  Visuospatial deficit  Attention and concentration deficit    Problem List Patient Active Problem List   Diagnosis Date Noted  . HTN, goal below 130/80 08/05/2017  . Health education/counseling 08/05/2017  . High risk medications (not anticoagulants) long-term use 08/05/2017  . Neuritis-  R sided:  arm and leg/ body due to stroke 08/05/2017  . Elevated LDL cholesterol level 07/11/2017  . [employer] ([employer]H) Stroke Scale limb ataxia score 2, ataxia present in two limbs 06/25/2017  . 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 Vitamin D deficiency 05/29/2017  . History of tobacco  abuse-  30pk yr hx - quit 04/07/17 05/21/2017  . Gait disturbance, post-stroke 05/14/2017  . Benign essential HTN   . Vitreous hemorrhage of right eye ([name])   . Adjustment disorder with mixed anxiety and depressed mood   . 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 SAH (subarachnoid hemorrhage) ([name]) 04/19/2017  . Basilar artery aneurysm ([name])   . Hypoxia   . 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 CVA (cerebral vascular accident) ([name]) 04/07/2017  . Elevated gastrin level 01/25/2012  . Hypokalemia 01/21/2012  . Nausea & vomiting 01/20/2012  . Epigastric pain 01/20/2012  . Duodenal ulcer, acute with obstruction 10/17/2011  . S/P laparoscopic cholecystectomy 10/16/2011    Scott Fix 08/05/2017, 4:52 PM  [facility] [address] [place], [place], [address] Phone: [phone]   Fax:  [phone]  Name: PRIANNA SARNOFF MRN: [id number] Date of Birth: 1976/06/11

## 2017-08-05 NOTE — Therapy (Signed)
[facility] [address] [place], [place], [address] Phone: [phone]   Fax:  [phone]  Physical Therapy Treatment  Patient Details  Name: [name] Barnett MRN: [id number] Date of Birth: 1976-10-30 Referring Provider: Erick Colace, MD  Encounter Date: 08/05/2017      PT End of Session - 08/05/17 2018    Visit Number 4   Number of Visits 17   Date for PT Re-Evaluation 09/13/17   Authorization Type BC[employer]   PT Start Time 1532   PT Stop Time 1620   PT Time Calculation (min) 48 min   Activity Tolerance Patient tolerated treatment well   Behavior During Therapy Restless      Past Medical History:  Diagnosis Date  . Duodenal obstruction   . GERD (gastroesophageal reflux disease)   . Hypertension     Past Surgical History:  Procedure Laterality Date  . ABDOMINAL HYSTERECTOMY    . BALLOON DILATION  12/31/2011   Procedure: BALLOON DILATION;  Surgeon: Barrie Folk, MD;  Location: [facility] ENDOSCOPY;  Service: Endoscopy;  Laterality: N/A;  . CHOLECYSTECTOMY  07/28/11  . ESOPHAGOGASTRODUODENOSCOPY  10/17/2011   Procedure: ESOPHAGOGASTRODUODENOSCOPY (EGD);  Surgeon: Barrie Folk, MD;  Location: [facility] ENDOSCOPY;  Service: Endoscopy;  Laterality: N/A;  . IR ANGIO INTRA EXTRACRAN SEL INTERNAL CAROTID BILAT MOD SED  04/07/2017  . IR ANGIO VERTEBRAL SEL VERTEBRAL UNI R MOD SED  04/07/2017  . IR ANGIOGRAM FOLLOW UP STUDY  04/07/2017  . IR ANGIOGRAM FOLLOW UP STUDY  04/07/2017  . IR ANGIOGRAM FOLLOW UP STUDY  04/07/2017  . IR ANGIOGRAM FOLLOW UP STUDY  04/07/2017  . IR ANGIOGRAM FOLLOW UP STUDY  04/07/2017  . IR ANGIOGRAM SELECTIVE EACH ADDITIONAL VESSEL  04/07/2017  . IR TRANSCATH/EMBOLIZ  04/07/2017  . PARS PLANA VITRECTOMY Right 05/01/2017   Procedure: PARS PLANA VITRECTOMY WITH 25 GAUGE RIGHT EYE, endolaser photocoaglation;  Surgeon: Carmela Rima, MD;  Location: [facility] OR;  Service: Ophthalmology;  Laterality: Right;  . RADIOLOGY WITH  ANESTHESIA N/A 04/07/2017   Procedure: RADIOLOGY WITH ANESTHESIA;  Surgeon: Lisbeth Renshaw, MD;  Location: [facility] OR;  Service: Radiology;  Laterality: N/A;  . REPLACEMENT TOTAL KNEE BILATERAL  2004  . TEMPOROMANDIBULAR JOINT SURGERY     2 surgeries  . TUMOR REMOVAL    . VAGOTOMY  01/23/2012   Procedure: VAGOTOMY, antrectomy and BII;  Surgeon: Currie Paris, MD;  Location: [facility] OR;  Service: General;  Laterality: N/A;  Laparotomy with vagotomy.    There were no vitals filed for this visit.      Subjective Assessment - 08/05/17 1537    Subjective Continues to report pain in R LE and arm.  Went to MD and they adjusted some medication.     Patient is accompained by: Family member   Limitations Standing;Walking   Currently in Pain? Yes   Pain Score 5    Pain Location Arm   Pain Orientation Right   Pain Descriptors / Indicators Pins and needles   Pain Type Acute pain   Pain Radiating Towards and R LE   Pain Onset More than a month ago   Pain Frequency Constant   Aggravating Factors  nothing   Pain Relieving Factors nothing                         [facility] Adult PT Treatment/Exercise - 08/05/17 0001      Neuro Re-ed    Neuro Re-ed  Details  NMR in transitional movements, in standing without UE support performing small controlled movements to decrease ataxia during gait and mobility.  Performed sit<>stand with emphasis on foot positioning and improving forward weight shift with slow movement forward and up, rather than locking out knees against mat for stability.  Performed x 5 reps. Performed standing lateral weight shifts with return to midline without UE support (chair placed in front for safety).  Progressed to lateral weight shifts with head turns over shoulder with return to midline before returning to each side x 10 reps and finally taking step forward/backwards on R side then L x 10 reps each with pts UE supported on PTs shoulders to decrease tension and amount of push  through arms.  Pt did very well when cued for slower motion and to "feel" adequate weight shift.  Then progressed to gait without RW to carryover NMR tasks.  Pts arms supported on PTs arms to decrease support and emphasize upright posture.  Cues for slow controlled stepping and improved weight shift over LLE.  Pt did very well at min A level, therfore provided pt with RW with same cues.  Note improvement in gait pattern, but still needs cues for decreased UE extension/push. and slowed gait speed.  Performed both bouts of gait x 115' each.                  PT Education - 08/05/17 1538    Education provided Yes   Education Details Discussed pt/spouse speaking with Dr. Wynn Banker regarding medications for neuropathic pain.    Person(s) Educated Patient;Spouse   Methods Explanation   Comprehension Verbalized understanding          PT Short Term Goals - 08/01/17 1447      PT SHORT TERM GOAL #1   Title Pt will participate in vestibular assessment and assessment of gait velocity with LTG to be set    Time 4   Period Weeks   Status Achieved     PT SHORT TERM GOAL #2   Title Pt will improve balance and midline orientation to perform sit <> stand with supervision and be able to maintain static standing x 2 minutes without UE support and no LOB to R   Time 4   Period Weeks   Status New   Target Date 08/14/17     PT SHORT TERM GOAL #3   Title Pt will decrease falls risk as indicated by increase in BERG balance score to > or = 22/56   Baseline 17/56   Time 4   Period Weeks   Status New   Target Date 08/14/17     PT SHORT TERM GOAL #4   Title Pt will ambulate x 150' on indoor surfaces with LRAD and min A and require <10% assistance to maintain AD in straight path and <10% verbal cues for gait pattern.   Time 4   Period Weeks   Status New   Target Date 08/14/17           PT Long Term Goals - 08/01/17 1447      PT LONG TERM GOAL #1   Title Pt and husband will demonstrate  independence with HEP   Time 8   Period Weeks   Status New   Target Date 09/13/17     PT LONG TERM GOAL #2   Title Pt will demonstrate improved balance and decreased falls risk as indicated by BERG balance score of > or = 27/56  Time 8   Period Weeks   Status New   Target Date 09/13/17     PT LONG TERM GOAL #3   Title Pt will ambulate with LRAD x 300' over level indoor and paved outdoor surfaces with supervision and intermittent cues for gait sequence   Time 8   Period Weeks   Status New   Target Date 09/13/17     PT LONG TERM GOAL #4   Title Pt will report 15% improvement in Neuro QOL-LE   Baseline 31.3%   Time 8   Period Weeks   Status New   Target Date 09/13/17     PT LONG TERM GOAL #5   Title Pt will decrease falls risk during gait in home/community as indicated by increase in gait velocity to > or = 1.70ft/sec with LRAD   Baseline .86 ft/sec with RW and up to mod A on 07/29/17   Time 8   Period Weeks   Status New   Target Date 09/13/17     PT LONG TERM GOAL #6   Title Pt will report <2/10 dizziness with head movements and will demonstrate improved gaze stability as indicated by ability to perform x 1 viewing HEP x 60 seconds in each direction   Time 8   Period Weeks   Status Revised   Target Date 09/13/17               Plan - 08/05/17 2018    Clinical Impression Statement Skilled session focused on NMR in standing and gait to emphasize slow controlled movement to decrease ataxia.  Pt with marked improvement within session.     Rehab Potential Good   PT Frequency 2x / week   PT Duration 8 weeks   PT Treatment/Interventions ADLs/Self Care Home Management;Electrical Stimulation;DME Instruction;Gait training;Stair training;Functional mobility training;Therapeutic activities;Therapeutic exercise;Balance training;Neuromuscular re-education;Cognitive remediation;Patient/family education;Orthotic Fit/Training;Vestibular;Visual/perceptual remediation/compensation    PT Next Visit Plan ASSESS VITALS EACH VISIT; review vestibular HEP and progress if able, GAIT TRAINING WITH ROLLATOR, NMR and balance training: weight shifting to L, WB for proprioceptive feedback, coordination   Consulted and Agree with Plan of Care Patient;Family member/caregiver   Family Member Consulted husband      Patient will benefit from skilled therapeutic intervention in order to improve the following deficits and impairments:  Abnormal gait, Decreased balance, Decreased cognition, Decreased coordination, Decreased strength, Difficulty walking, Dizziness, Impaired sensation, Impaired vision/preception, Pain  Visit Diagnosis: Other lack of coordination  Ataxic gait  Other abnormalities of gait and mobility  Unsteadiness on feet  Dizziness and giddiness     Problem List Patient Active Problem List   Diagnosis Date Noted  . HTN, goal below 130/80 08/05/2017  . Health education/counseling 08/05/2017  . High risk medications (not anticoagulants) long-term use 08/05/2017  . Neuritis-  R sided:  arm and leg/ body due to stroke 08/05/2017  . Elevated LDL cholesterol level 07/11/2017  . [employer] (NIH) Stroke Scale limb ataxia score 2, ataxia present in two limbs 06/25/2017  . 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 Vitamin D deficiency 05/29/2017  . History of tobacco abuse-  30pk yr hx - quit 04/07/17 05/21/2017  . Gait disturbance, post-stroke 05/14/2017  . Benign essential HTN   . Vitreous hemorrhage of right eye ([name])   . Adjustment disorder with mixed anxiety and depressed mood   . 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  SAH (subarachnoid hemorrhage) ([name]) 04/19/2017  . Basilar artery aneurysm ([name])   . Hypoxia   . 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 CVA (cerebral vascular accident) ([name]) 04/07/2017  . Elevated gastrin  level 01/25/2012  . Hypokalemia 01/21/2012  . Nausea & vomiting 01/20/2012  . Epigastric pain 01/20/2012  . Duodenal ulcer, acute with obstruction 10/17/2011  . S/P laparoscopic cholecystectomy 10/16/2011    Harriet Butte, PT, MPT [facility] [address] [place], [place], [address] Phone: [phone]   Fax:  [phone] 08/05/17, 8:21 PM  Name: MARWAH DISBRO MRN: [id number] Date of Birth: 02/27/1976

## 2017-08-06 ENCOUNTER — Encounter: Payer: Self-pay | Admitting: Physical Medicine & Rehabilitation

## 2017-08-06 ENCOUNTER — Ambulatory Visit (HOSPITAL_BASED_OUTPATIENT_CLINIC_OR_DEPARTMENT_OTHER): Payer: BLUE CROSS/BLUE SHIELD | Admitting: Physical Medicine & Rehabilitation

## 2017-08-06 ENCOUNTER — Encounter: Payer: BLUE CROSS/BLUE SHIELD | Attending: Physical Medicine & Rehabilitation

## 2017-08-06 VITALS — BP 101/67 | HR 87

## 2017-08-06 DIAGNOSIS — R269 Unspecified abnormalities of gait and mobility: Secondary | ICD-10-CM

## 2017-08-06 DIAGNOSIS — I69398 Other sequelae of cerebral infarction: Secondary | ICD-10-CM | POA: Diagnosis not present

## 2017-08-06 DIAGNOSIS — R209 Unspecified disturbances of skin sensation: Secondary | ICD-10-CM

## 2017-08-06 DIAGNOSIS — I725 Aneurysm of other precerebral arteries: Secondary | ICD-10-CM | POA: Diagnosis present

## 2017-08-06 DIAGNOSIS — R42 Dizziness and giddiness: Secondary | ICD-10-CM | POA: Diagnosis not present

## 2017-08-06 DIAGNOSIS — H539 Unspecified visual disturbance: Secondary | ICD-10-CM

## 2017-08-06 DIAGNOSIS — I69093 Ataxia following nontraumatic subarachnoid hemorrhage: Secondary | ICD-10-CM | POA: Insufficient documentation

## 2017-08-06 MED ORDER — GABAPENTIN 100 MG PO CAPS: 100.0000 mg | ORAL_CAPSULE | Freq: Every day | ORAL | 0 refills | Status: DC

## 2017-08-06 NOTE — Progress Notes (Signed)
Subjective:    Patient ID: [name] Barnett, female    DOB: 1976/02/22, 41 y.o.   MRN: [id number]  HPI  Larey Seat 1 week ago on her way to getting her walker was up by herself. Because bruising of the left knee and left forearm. No head trauma.  Still complains of poor vision. Has difficulty with focusing, as well as double vision when looking toward the left side. Planning to follow-up with ophthalmology Thursday  Also having pins and needle sensation and pain involving the right thigh, extending into the right foot. Patient also occasionally has right forearm, pins and needle sensation and pain, but this is not as severe as on the right thigh Has tried Tylenol, but this is not effective. Pain Inventory Average Pain 5 Pain Right Now 5 My pain is constant and aching  In the last 24 hours, has pain interfered with the following? General activity 5 Relation with others 5 Enjoyment of life 5 What TIME of day is your pain at its worst? all Sleep (in general) Good  Pain is worse with: sitting Pain improves with: therapy/exercise Relief from Meds: 0  Mobility walk with assistance use a walker ability to climb steps?  yes do you drive?  no  Function disabled: date disabled 16-18 I need assistance with the following:  bathing, meal prep and household duties  Neuro/Psych weakness numbness trouble walking dizziness  Prior Studies Any changes since last visit?  no  Physicians involved in your care Any changes since last visit?  no   Family History  Problem Relation Age of Onset  . Hypertension Mother   . Restless legs syndrome Mother   . Hyperlipidemia Mother   . Heart disease Father   . Malignant hyperthermia Neg Hx    Social History   Social History  . Marital status: Married    Spouse name: N/A  . Number of children: N/A  . Years of education: N/A   Social History Main Topics  . Smoking status: Former Smoker    Packs/day: 1.00    Years: 20.00    Types:  Cigarettes    Quit date: 04/07/2017  . Smokeless tobacco: Never Used  . Alcohol use No  . Drug use: No  . Sexual activity: Not Currently    Partners: Male   Other Topics Concern  . Not on file   Social History Narrative   Lives in [place] with husband and 2 kids. Works as a Psychologist, prison and probation services.   Past Surgical History:  Procedure Laterality Date  . ABDOMINAL HYSTERECTOMY    . BALLOON DILATION  12/31/2011   Procedure: BALLOON DILATION;  Surgeon: Barrie Folk, MD;  Location: [facility] ENDOSCOPY;  Service: Endoscopy;  Laterality: N/A;  . CHOLECYSTECTOMY  07/28/11  . ESOPHAGOGASTRODUODENOSCOPY  10/17/2011   Procedure: ESOPHAGOGASTRODUODENOSCOPY (EGD);  Surgeon: Barrie Folk, MD;  Location: [facility] ENDOSCOPY;  Service: Endoscopy;  Laterality: N/A;  . IR ANGIO INTRA EXTRACRAN SEL INTERNAL CAROTID BILAT MOD SED  04/07/2017  . IR ANGIO VERTEBRAL SEL VERTEBRAL UNI R MOD SED  04/07/2017  . IR ANGIOGRAM FOLLOW UP STUDY  04/07/2017  . IR ANGIOGRAM FOLLOW UP STUDY  04/07/2017  . IR ANGIOGRAM FOLLOW UP STUDY  04/07/2017  . IR ANGIOGRAM FOLLOW UP STUDY  04/07/2017  . IR ANGIOGRAM FOLLOW UP STUDY  04/07/2017  . IR ANGIOGRAM SELECTIVE EACH ADDITIONAL VESSEL  04/07/2017  . IR TRANSCATH/EMBOLIZ  04/07/2017  . PARS PLANA VITRECTOMY Right 05/01/2017   Procedure: PARS PLANA VITRECTOMY WITH  25 GAUGE RIGHT EYE, endolaser photocoaglation;  Surgeon: Carmela Rima, MD;  Location: [facility] OR;  Service: Ophthalmology;  Laterality: Right;  . RADIOLOGY WITH ANESTHESIA N/A 04/07/2017   Procedure: RADIOLOGY WITH ANESTHESIA;  Surgeon: Lisbeth Renshaw, MD;  Location: [facility] OR;  Service: Radiology;  Laterality: N/A;  . REPLACEMENT TOTAL KNEE BILATERAL  2004  . TEMPOROMANDIBULAR JOINT SURGERY     2 surgeries  . TUMOR REMOVAL    . VAGOTOMY  01/23/2012   Procedure: VAGOTOMY, antrectomy and BII;  Surgeon: Currie Paris, MD;  Location: [facility] OR;  Service: General;  Laterality: N/A;  Laparotomy with vagotomy.   Past Medical History:  Diagnosis Date   . Duodenal obstruction   . GERD (gastroesophageal reflux disease)   . Hypertension    LMP 07/11/2017   Opioid Risk Score:   Fall Risk Score:  `1  Depression screen PHQ 2/9  Depression screen [facility] 2/9 08/01/2017 07/11/2017 05/14/2017  Decreased Interest 0 2 3  Down, Depressed, Hopeless 0 1 2  PHQ - 2 Score 0 3 5  Altered sleeping 0 0 3  Tired, decreased energy 1 3 3   Change in appetite 0 0 1  Feeling bad or failure about yourself  0 0 1  Trouble concentrating 0 1 2  Moving slowly or fidgety/restless 0 3 2  Suicidal thoughts 0 1 0  PHQ-9 Score 1 11 17   Difficult doing work/chores - - Very difficult     Review of Systems  Constitutional: Negative.   HENT: Negative.   Eyes: Negative.   Respiratory: Negative.   Cardiovascular: Negative.   Gastrointestinal: Negative.   Endocrine: Negative.   Genitourinary: Negative.   Musculoskeletal: Negative.   Skin: Negative.   Allergic/Immunologic: Negative.   Neurological: Negative.   Hematological: Negative.   Psychiatric/Behavioral: Negative.   All other systems reviewed and are negative.      Objective:   Physical Exam  Constitutional: She is oriented to person, place, and time. She appears well-developed and well-nourished.  HENT:  Head: Normocephalic and atraumatic.  Eyes: Pupils are equal, round, and reactive to light. Conjunctivae and EOM are normal.  Neck: Normal range of motion.  Neurological: She is alert and oriented to person, place, and time.  Psychiatric: She has a normal mood and affect. Her behavior is normal. Judgment and thought content normal.    Diplopia, left lateral gaze. No evidence of nystagmus  Sensation altered to pinprick in the right upper and right lower limb. Intact light touch able to identify correctly fingers. Dysmetria on left finger-nose-finger moderate Mild dysmetria, left heel-to-shin Dysdiadochokinesis. Rapid alternating supination, pronation left upper limb  Left knee bruised. No  tenderness palpation, left dorsal forearm bruise. No tears. Palpation    Assessment & Plan:  1. 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  HTN with soft BPs asymptomatic f/u PCP  3. 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

## 2017-08-06 NOTE — Patient Instructions (Addendum)
Please ask Dr Patric Dykes if your coil is MRI compatible.  May try taking gabapentin up to 3 times per day, if it making her drowsy, just once at nighttime

## 2017-08-07 ENCOUNTER — Other Ambulatory Visit: Payer: Self-pay | Admitting: Family Medicine

## 2017-08-07 DIAGNOSIS — R42 Dizziness and giddiness: Secondary | ICD-10-CM | POA: Insufficient documentation

## 2017-08-07 DIAGNOSIS — R531 Weakness: Secondary | ICD-10-CM

## 2017-08-07 NOTE — Assessment & Plan Note (Signed)
Patient understands importance of abstinence

## 2017-08-07 NOTE — Assessment & Plan Note (Signed)
-   Patient's 10 year atherosclerotic cardiovascular disease risk profile is currently at 0.7%;  optimally 0.3 - Discussed with patient she does not need a statin medication unless her neurology specialists feel she should be on one. - We discussed intensive dietary and lifestyle changes to treat this elevation in LDL. - Importance of continuing smoking cessation discussed with patient. - Importance of controlling blood pressure also discussed

## 2017-08-07 NOTE — Assessment & Plan Note (Addendum)
-   Continue Norvasc - Start valsartan.  One half tablet daily.   - Monitor home blood pressure.  It should not make her dizziness worse- if it does let me know.    - I told pt if blood pressure is still not at goal of less than 140/90 on a regular basis then take the full tablet after 10 days.     - I will see you in another 3-4 weeks and see how you're doing.  - Did also briefly discussed with patient the possibility of having autonomic dysfunction of her nervous system as being the reason why her blood pressure so labile and up and down.  Encouraged her to speak with her stroke specialist about this and see if they feel that might be a issue.  Advised patient to get further recommendations from the specialist regarding this

## 2017-08-07 NOTE — Assessment & Plan Note (Signed)
-   Continue home monitoring of blood pressure. - Continue current treatment regiment/ med doses as appears to be well controlled

## 2017-08-07 NOTE — Assessment & Plan Note (Signed)
Discussed with patient a counselor would be very beneficial for her to obtain one, to discuss her concerns and issues.

## 2017-08-07 NOTE — Assessment & Plan Note (Signed)
-    Told pt we diagnose HTN based on two different BP readings a couple weeks a part -  check your blood pressure a couple times a day at home and either write it down or save it on your monitor.  Bring this in, in approximately 3 weeks for recheck.  Let us know sooner than your planned follow-up if you have any new onset of symptoms -  I'll try to touch base with Dr. Domingo CockingKierstein's and your Neurosurgeon re: best blood pressure medicines/ goal BP's if they have an opinion on that.  I am looking for their input in regards to your chronic dizziness and BP lability since your Neurological events.

## 2017-08-07 NOTE — Assessment & Plan Note (Addendum)
-   start prozac - Long discussion had with husband [name] and wife regarding risks benefits of Prozac and medicines that are like it. - Encouraged seeing a counselor

## 2017-08-07 NOTE — Assessment & Plan Note (Signed)
-   We'll obtain fasting labs on patient to rule out diabetes, check fasting lipid profile and others.   - See orders.

## 2017-08-07 NOTE — Assessment & Plan Note (Signed)
Cont abstinence

## 2017-08-07 NOTE — Assessment & Plan Note (Signed)
-   Start Lipitor after long discussion regarding risks benefits of the medicine. - Dietary and lifestyle modifications discussed especially drinking more water a daily basis - Discussed continued tobacco cessation - Recheck ALT  6 wks

## 2017-08-07 NOTE — Assessment & Plan Note (Signed)
>>  ASSESSMENT AND PLAN FOR ELEVATED LDL CHOLESTEROL LEVEL WRITTEN ON 08/07/2017  1:09 PM BY OPALSKI, DEBORAH, DO  - Start Lipitor after long discussion regarding risks benefits of the medicine. - Dietary and lifestyle modifications discussed especially drinking more water a daily basis - Discussed continued tobacco cessation - Recheck ALT  6 wks

## 2017-08-07 NOTE — Assessment & Plan Note (Signed)
>>  ASSESSMENT AND PLAN FOR SUBARACHNOID HEMORRHAGE DUE TO RUPTURED ANEURYSM ([name]) WRITTEN ON 08/07/2017 12:03 PM BY OPALSKI, DEBORAH, DO  - Asked patient to get me neurosurgeon's OV notes. - txmnt plan per them.

## 2017-08-07 NOTE — Assessment & Plan Note (Signed)
>>  ASSESSMENT AND PLAN FOR ELEVATED LDL CHOLESTEROL LEVEL WRITTEN ON 08/07/2017 12:44 PM BY OPALSKI, DEBORAH, DO  - Patient's 10 year atherosclerotic cardiovascular disease risk profile is currently at 0.7%;  optimally 0.3 - Discussed with patient she does not need a statin medication unless her neurology specialists feel she should be on one. - We discussed intensive dietary and lifestyle changes to treat this elevation in LDL. - Importance of continuing smoking cessation discussed with patient. - Importance of controlling blood pressure also discussed

## 2017-08-07 NOTE — Assessment & Plan Note (Signed)
Continue supplementation.  We'll recheck in 6 months or so

## 2017-08-07 NOTE — Assessment & Plan Note (Signed)
-   Patient will continue to follow up with her neurologist\neurosurgeon as well as her [facility] doctor. - She will continue home therapy. - Recommend she asked them if they feel vestibular rehabilitation would be of benefit at all to her.

## 2017-08-07 NOTE — Assessment & Plan Note (Signed)
-->    Pt's Vitamin D level is too low. -  Discussed importance of vitamin D (as well as calcium)  to their health and well-being.  - We reviewed possible symptoms of low Vitamin D including low energy, muscle aches, joint aches, even neurological sx etc.   - Weekly and daily prescriptions discussed with patient.  See med list. - Repeat level in 6-12 months

## 2017-08-07 NOTE — Assessment & Plan Note (Signed)
-   Asked patient to get me neurosurgeon's OV notes. - txmnt plan per them.

## 2017-08-07 NOTE — Assessment & Plan Note (Signed)
Follow with Dr Conchita ParisNundkumar ( N-sx)  and Dr Clementeen HoofKiersten's ( PM&R) per their recommendations

## 2017-08-07 NOTE — Assessment & Plan Note (Signed)
Chronic weakness with [facility]:  -Cont PT -Please try to work on walking daily per the recs of your therapist- using your walker to help build strength and for mental health and wellness   -Fall precautions d/c pt.

## 2017-08-07 NOTE — Assessment & Plan Note (Signed)
Total patient to please check and see what she is taking for supplements and back down 1/3 of current amnt on the B12.

## 2017-08-08 ENCOUNTER — Ambulatory Visit: Payer: BLUE CROSS/BLUE SHIELD | Admitting: Physical Therapy

## 2017-08-08 ENCOUNTER — Encounter: Payer: Self-pay | Admitting: Physical Therapy

## 2017-08-08 ENCOUNTER — Ambulatory Visit: Payer: BLUE CROSS/BLUE SHIELD | Admitting: Occupational Therapy

## 2017-08-08 DIAGNOSIS — R278 Other lack of coordination: Secondary | ICD-10-CM

## 2017-08-08 DIAGNOSIS — R2681 Unsteadiness on feet: Secondary | ICD-10-CM

## 2017-08-08 DIAGNOSIS — R41840 Attention and concentration deficit: Secondary | ICD-10-CM

## 2017-08-08 DIAGNOSIS — R208 Other disturbances of skin sensation: Secondary | ICD-10-CM

## 2017-08-08 DIAGNOSIS — R2689 Other abnormalities of gait and mobility: Secondary | ICD-10-CM

## 2017-08-08 DIAGNOSIS — I69054 Hemiplegia and hemiparesis following nontraumatic subarachnoid hemorrhage affecting left non-dominant side: Secondary | ICD-10-CM

## 2017-08-08 DIAGNOSIS — R260 Ataxic gait: Secondary | ICD-10-CM | POA: Diagnosis not present

## 2017-08-08 DIAGNOSIS — R42 Dizziness and giddiness: Secondary | ICD-10-CM

## 2017-08-08 DIAGNOSIS — R41842 Visuospatial deficit: Secondary | ICD-10-CM

## 2017-08-08 DIAGNOSIS — R414 Neurologic neglect syndrome: Secondary | ICD-10-CM

## 2017-08-08 DIAGNOSIS — I69018 Other symptoms and signs involving cognitive functions following nontraumatic subarachnoid hemorrhage: Secondary | ICD-10-CM

## 2017-08-08 DIAGNOSIS — R270 Ataxia, unspecified: Secondary | ICD-10-CM

## 2017-08-08 NOTE — Therapy (Signed)
[facility] [address] [place], [place], [address] Phone: [phone]   Fax:  [phone]  Occupational Therapy Treatment  Patient Details  Name: [name] Barnett F Cartier MRN: [id number] Date of Birth: 07/26/1976 Referring Provider: Dr. Claudette LawsAndrew Kirsteins  Encounter Date: 08/08/2017      OT End of Session - 08/08/17 1602    Visit Number 5   Number of Visits 17   Date for OT Re-Evaluation 09/13/17   Authorization Type BCBS, no visit limit/auth req.   OT Start Time 1455   OT Stop Time 1538   OT Time Calculation (min) 43 min   Activity Tolerance Patient tolerated treatment well   Behavior During Therapy WFL for tasks assessed/performed      Past Medical History:  Diagnosis Date  . Duodenal obstruction   . GERD (gastroesophageal reflux disease)   . Hypertension     Past Surgical History:  Procedure Laterality Date  . ABDOMINAL HYSTERECTOMY    . BALLOON DILATION  12/31/2011   Procedure: BALLOON DILATION;  Surgeon: Barrie FolkJohn C Hayes, MD;  [facility];  Service: Endoscopy;  Laterality: N/A;  . CHOLECYSTECTOMY  07/28/11  . ESOPHAGOGASTRODUODENOSCOPY  10/17/2011   Procedure: ESOPHAGOGASTRODUODENOSCOPY (EGD);  Surgeon: Barrie FolkJohn C Hayes, MD;  [facility];  Service: Endoscopy;  Laterality: N/A;  . IR ANGIO INTRA EXTRACRAN SEL INTERNAL CAROTID BILAT MOD SED  04/07/2017  . IR ANGIO VERTEBRAL SEL VERTEBRAL UNI R MOD SED  04/07/2017  . IR ANGIOGRAM FOLLOW UP STUDY  04/07/2017  . IR ANGIOGRAM FOLLOW UP STUDY  04/07/2017  . IR ANGIOGRAM FOLLOW UP STUDY  04/07/2017  . IR ANGIOGRAM FOLLOW UP STUDY  04/07/2017  . IR ANGIOGRAM FOLLOW UP STUDY  04/07/2017  . IR ANGIOGRAM SELECTIVE EACH ADDITIONAL VESSEL  04/07/2017  . IR TRANSCATH/EMBOLIZ  04/07/2017  . PARS PLANA VITRECTOMY Right 05/01/2017   Procedure: PARS PLANA VITRECTOMY WITH 25 GAUGE RIGHT EYE, endolaser photocoaglation;  Surgeon: Carmela RimaPatel, Narendra, MD;  [facility];  Service:  Ophthalmology;  Laterality: Right;  . RADIOLOGY WITH ANESTHESIA N/A 04/07/2017   Procedure: RADIOLOGY WITH ANESTHESIA;  Surgeon: [name], MD;  [facility];  Service: Radiology;  Laterality: N/A;  . REPLACEMENT TOTAL KNEE BILATERAL  2004  . TEMPOROMANDIBULAR JOINT SURGERY     2 surgeries  . TUMOR REMOVAL    . VAGOTOMY  01/23/2012   Procedure: VAGOTOMY, antrectomy and BII;  Surgeon: [name], MD;  Location: [facility] OR;  Service: General;  Laterality: N/A;  Laparotomy with vagotomy.    There were no vitals filed for this visit.      Subjective Assessment - 08/08/17 1459    Subjective  Went to ophthalmologist today and got prism sticker put on glasses for reading only.  Did not bring today, bur OT recommended pt bring to future therapy visits.     Patient is accompained by: Family member   Pertinent History Subarachnoid hemorrhage due to ruptured aneurysm, ataxia (L cerebellar); HTN, R side decr sensation, vertical diplopia/visual deficits, ataxia, Bilateral retinal hemorhage associated with SAH (with surgery on R eye in hospital and L approx 1 month ago)   Limitations visual deficits, fall risk, impulsivity/cognitive deficits   Patient Stated Goals be able to walk and use L arm   Currently in Pain? No/denies   Pain Onset More than a month ago      Recommended pt bring prism glasses to therapy.  Pt reports that she is not doing OT HEP at  home but is not sure why she won't as her husband sets up activity and encourages her to.  Emphasized importance for progress and recommended that pt set a timer and perform multiple short sessions a day vs. Longer sessions.  Pt verbalized understanding.  Ambulated to gym with close supervision/min cueing to avoid going to the R (with RW).  Copying medium peg design for visual scanning/visual perceptual skills and using LUE for incr coordination and control.  Mod cueing to slow down and use controlled movements.  Then removed using LUE  with mod cueing to place in container gently and slowly.   Pt with improvement when she slowed down.  Stacking 6 blocks (1-inch) and unstacking with min-mod cueing and focus on slow, controlled movements and use tip pinch with LUE.  Fastening buttons with mod difficulty and cues for slow movements and for tip pinch with LUE.                        OT Short Term Goals - 07/16/17 1221      OT SHORT TERM GOAL #1   Title Pt/caregiver will be independent with initial HEP.--check STGs 08/14/17   Time 4   Period Weeks   Status New     OT SHORT TERM GOAL #2   Title Pt will be use LUE as nondominant assist at least 50% of the time for ADLs without cueing.   Time 4   Period Weeks   Status New     OT SHORT TERM GOAL #3   Title Pt will improve LUE coordination/control to improve score on box and blocks test by at least 6 with LUE.   Baseline 19 blocks   Time 4   Period Weeks   Status New     OT SHORT TERM GOAL #4   Title Pt will perform simple home maintenance task/snack prep in sitting with set-up.   Time 4   Period Weeks   Status New     OT SHORT TERM GOAL #5   Title Pt/husband will verbalize understanding of visual compensation stategies for improved safety/incr ease with ADLs.   Time 4   Period Weeks   Status New           OT Long Term Goals - 07/16/17 1907      OT LONG TERM GOAL #1   Title Pt/caregiver will be independent with updated HEP.--check LTGs 09/13/17   Time 8   Period Weeks   Status New     OT LONG TERM GOAL #2   Title Pt will be use LUE as nondominant assist at least 75% of the time for ADLs without cueing.   Time 8   Period Weeks   Status New     OT LONG TERM GOAL #3   Title Pt will perform toileting mod I (including transfer).   Time 8   Period Weeks   Status New     OT LONG TERM GOAL #4   Title Pt will improve LUE coordination/control to improve score on box and blocks test by at least 12 with LUE.   Baseline 19 blocks   Time  8   Period Weeks   Status New     OT LONG TERM GOAL #5   Title Pt will perform simple home maintenance task/snack prep in standing with set-up/supervision.   Time 8   Period Weeks   Status New     OT LONG TERM GOAL #6  Title Pt will perform simple environmental scanning/navigation with supervision.   Time 8   Period Weeks   Status New               Plan - 08/08/17 1501    Clinical Impression Statement Pt is progressing towards goals.  She demo improved LUE use/control with focus and mod cueing for slow, controlled movements.  Impulsivity is a barrier.   Rehab Potential Good   Current Impairments/barriers affecting progress: cognitive deficits, impulsivity   OT Frequency 2x / week   OT Duration 8 weeks   OT Treatment/Interventions Self-care/ADL training;DME and/or AE instruction;Patient/family education;Therapeutic exercises;Balance training;Fluidtherapy;Moist Heat;Ultrasound;Therapeutic exercise;Therapeutic activities;Cryotherapy;Neuromuscular education;Functional Mobility Training;Passive range of motion;Cognitive remediation/compensation;Visual/perceptual remediation/compensation;Manual Therapy   Plan L side awareness, LUE functional use, visual perceptual skills, ?recommend/discuss speech therapy due to cognitive deficits   OT Home Exercise Plan Education provided:  HEP for LUE control, coordination, and functional use   Consulted and Agree with Plan of Care Patient;Family member/caregiver   Family Member Consulted husband      Patient will benefit from skilled therapeutic intervention in order to improve the following deficits and impairments:  Decreased coordination, Decreased range of motion, Difficulty walking, Abnormal gait, Decreased safety awareness, Impaired sensation, Decreased knowledge of precautions, Decreased balance, Decreased knowledge of use of DME, Impaired UE functional use, Decreased cognition, Decreased mobility, Decreased strength, Impaired  vision/preception, Impaired perceived functional ability  Visit Diagnosis: Other lack of coordination  Other disturbances of skin sensation  Visuospatial deficit  Attention and concentration deficit  Other abnormalities of gait and mobility  Unsteadiness on feet  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 Weakness with dizziness-  since [facility] and CVA 04/07/17 08/07/2017  . Disturbances of vision, late effect of stroke 08/06/2017  . Health education/counseling 08/05/2017  . High risk medications (not anticoagulants) long-term use 08/05/2017  . Neuritis-  R sided:  arm and leg/ body due to stroke 08/05/2017  . Elevated LDL cholesterol level 07/11/2017  . [employer] ([employer]) Stroke Scale limb ataxia score 2, ataxia present in two limbs 06/25/2017  . 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 Elevated vitamin B12 level 05/30/2017  . Vitamin D deficiency 05/29/2017  . History of tobacco abuse-  30pk yr hx - quit 04/07/17 05/21/2017  . Gait disturbance, post-stroke 05/14/2017  . Benign essential HTN   . Vitreous hemorrhage of right eye ([name])   . Adjustment disorder with mixed anxiety and depressed mood   . 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 s/p SAH (subarachnoid hemorrhage) ([name]) 04/19/2017  . Basilar artery aneurysm ([name])   . Hypoxia   . 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 CVA (cerebral vascular accident) ([name]) 04/07/2017  . Elevated gastrin level 01/25/2012  . Hypokalemia 01/21/2012  . Nausea & vomiting 01/20/2012  . Epigastric pain 01/20/2012  . Duodenal  ulcer, acute with obstruction 10/17/2011  . S/P laparoscopic cholecystectomy 10/16/2011    [facility] 08/08/2017, 5:38 PM  [facility] [address] [place], [place], [address] Phone: [phone]   Fax:  [phone]  Name: DANEYA HARTGROVE MRN: [id number] Date of Birth: 1976-06-14   Marylene Land  Neale Burly, OTR/L [facility] [address] [place], [place]  [address] [phone] phone [phone] 08/08/17 5:42 PM

## 2017-08-09 NOTE — Therapy (Signed)
[facility] [address] [place], [place], [address] Phone: [phone]   Fax:  [phone]  Physical Therapy Treatment  Patient Details  Name: [name] Barnett MRN: [id number] Date of Birth: 1976/02/12 Referring Provider: Erick Colace, MD  Encounter Date: 08/08/2017      PT End of Session - 08/08/17 1624    Visit Number 5   Number of Visits 17   Date for PT Re-Evaluation 09/13/17   Authorization Type BCBS   PT Start Time 1620   PT Stop Time 1702   PT Time Calculation (min) 42 min   Equipment Utilized During Treatment Gait belt   Activity Tolerance Patient tolerated treatment well   Behavior During Therapy Restless;WFL for tasks assessed/performed;Impulsive      Past Medical History:  Diagnosis Date  . Duodenal obstruction   . GERD (gastroesophageal reflux disease)   . Hypertension     Past Surgical History:  Procedure Laterality Date  . ABDOMINAL HYSTERECTOMY    . BALLOON DILATION  12/31/2011   Procedure: BALLOON DILATION;  Surgeon: Barrie Folk, MD;  Location: [facility] ENDOSCOPY;  Service: Endoscopy;  Laterality: N/A;  . CHOLECYSTECTOMY  07/28/11  . ESOPHAGOGASTRODUODENOSCOPY  10/17/2011   Procedure: ESOPHAGOGASTRODUODENOSCOPY (EGD);  Surgeon: Barrie Folk, MD;  Location: [facility] END[facility]COPY;  Service: Endoscopy;  Laterality: N/A;  . IR ANGIO INTRA EXTRACRAN SEL INTERNAL CAROTID BILAT MOD SED  04/07/2017  . IR ANGIO VERTEBRAL SEL VERTEBRAL UNI R MOD SED  04/07/2017  . IR ANGIOGRAM FOLLOW UP STUDY  04/07/2017  . IR ANGIOGRAM FOLLOW UP STUDY  04/07/2017  . IR ANGIOGRAM FOLLOW UP STUDY  04/07/2017  . IR ANGIOGRAM FOLLOW UP STUDY  04/07/2017  . IR ANGIOGRAM FOLLOW UP STUDY  04/07/2017  . IR ANGIOGRAM SELECTIVE EACH ADDITIONAL VESSEL  04/07/2017  . IR TRANSCATH/EMBOLIZ  04/07/2017  . PARS PLANA VITRECTOMY Right 05/01/2017   Procedure: PARS PLANA VITRECTOMY WITH 25 GAUGE RIGHT EYE, endolaser photocoaglation;  Surgeon: Carmela Rima, MD;  Location: [facility] OR;  Service: Ophthalmology;  Laterality: Right;  . RADIOLOGY WITH ANESTHESIA N/A 04/07/2017   Procedure: RADIOLOGY WITH ANESTHESIA;  Surgeon: Lisbeth Renshaw, MD;  Location: [facility] OR;  Service: Radiology;  Laterality: N/A;  . REPLACEMENT TOTAL KNEE BILATERAL  2004  . TEMPOROMANDIBULAR JOINT SURGERY     2 surgeries  . TUMOR REMOVAL    . VAGOTOMY  01/23/2012   Procedure: VAGOTOMY, antrectomy and BII;  Surgeon: Currie Paris, MD;  Location: [facility] OR;  Service: General;  Laterality: N/A;  Laparotomy with vagotomy.    There were no vitals filed for this visit.      Subjective Assessment - 08/08/17 1622    Subjective No new complaints. Reports pain is better with use of Neurotin which she is now taking 3x day. No falls.    Patient is accompained by: Family member   Limitations Standing;Walking   Currently in Pain? Yes   Pain Score 3    Pain Location Leg   Pain Orientation Right   Pain Descriptors / Indicators Pins and needles   Pain Type Chronic pain   Pain Onset More than a month ago   Pain Frequency Constant  less intense   Aggravating Factors  stillness for too long   Pain Relieving Factors neurotin, movement        08/08/17 1647  Transfers  Transfers Sit to Stand;Stand to Sit  Number of Reps 2 sets;10 reps  Comments cues for technique and sequencing  to decrease use of LE's on surface for stability with standing. cues for incr ant weight shifting as well. cues for incr left LE weight bearing with standing to allow equal LE use with standing and sitting. mirror feedback used as well for this.    Ambulation/Gait  Ambulation/Gait Yes  Ambulation/Gait Assistance 4: Min assist  Ambulation/Gait Assistance Details PTA in front of pt with pt resting arms on PTA forearms for support. assist/facilitation for lateral weight shifting with cues for incr step length. cued pt to "follow my feet" as a visual cue to assist with base of support and incr step length  with gait.                         Ambulation Distance (Feet) 110 Feet  Assistive device 1 person hand held assist (bil forarm support on PTA)  Gait Pattern Step-through pattern;Decreased stride length;Ataxic;Narrow base of support  Ambulation Surface Level;Indoor  Neuro Re-ed   Neuro Re-ed Details  in standing with mirror feedback: standing without support working on lateral weight shifting with emphasis on tall posture, progressing to forward/backward stepping with each foot, then laterally stepping out/in with each foot. increased assist needed for balance with left LE movements vs right. chair in front of pt for safety with balance; on red mat on floor next to mat table: tall kneeling- mini squats x 10 reps, then side stepping left<>right on knees, cues needed on weight shifitng and lifting at hip to clear mat vs sliding leg along, progressed to half kneeling with min assist to achieve this position and hold this position for alternating UE raises x 10 each arm with each foot forward. cues on posture and weight positioning/shifting to assist with balance;                                                              PT Short Term Goals - 08/01/17 1447      PT SHORT TERM GOAL #1   Title Pt will participate in vestibular assessment and assessment of gait velocity with LTG to be set    Time 4   Period Weeks   Status Achieved     PT SHORT TERM GOAL #2   Title Pt will improve balance and midline orientation to perform sit <> stand with supervision and be able to maintain static standing x 2 minutes without UE support and no LOB to R   Time 4   Period Weeks   Status New   Target Date 08/14/17     PT SHORT TERM GOAL #3   Title Pt will decrease falls risk as indicated by increase in BERG balance score to > or = 22/56   Baseline 17/56   Time 4   Period Weeks   Status New   Target Date 08/14/17     PT SHORT TERM GOAL #4   Title Pt will ambulate x 150' on indoor surfaces with LRAD and  min A and require <10% assistance to maintain AD in straight path and <10% verbal cues for gait pattern.   Time 4   Period Weeks   Status New   Target Date 08/14/17           PT Long Term Goals - 08/01/17 1447  PT LONG TERM GOAL #1   Title Pt and husband will demonstrate independence with HEP   Time 8   Period Weeks   Status New   Target Date 09/13/17     PT LONG TERM GOAL #2   Title Pt will demonstrate improved balance and decreased falls risk as indicated by BERG balance score of > or = 27/56   Time 8   Period Weeks   Status New   Target Date 09/13/17     PT LONG TERM GOAL #3   Title Pt will ambulate with LRAD x 300' over level indoor and paved outdoor surfaces with supervision and intermittent cues for gait sequence   Time 8   Period Weeks   Status New   Target Date 09/13/17     PT LONG TERM GOAL #4   Title Pt will report 15% improvement in Neuro QOL-LE   Baseline 31.3%   Time 8   Period Weeks   Status New   Target Date 09/13/17     PT LONG TERM GOAL #5   Title Pt will decrease falls risk during gait in home/community as indicated by increase in gait velocity to > or = 1.26ft/sec with LRAD   Baseline .86 ft/sec with RW and up to mod A on 07/29/17   Time 8   Period Weeks   Status New   Target Date 09/13/17     PT LONG TERM GOAL #6   Title Pt will report <2/10 dizziness with head movements and will demonstrate improved gaze stability as indicated by ability to perform x 1 viewing HEP x 60 seconds in each direction   Time 8   Period Weeks   Status Revised   Target Date 09/13/17        08/08/17 1624  Plan  Clinical Impression Statement todays's skilled session continued to address balance, strengthening and weight shifting without any issues reported. Pt is progressing toward goals and should beneift from continued PT to progress toward unmet goals.   Pt will benefit from skilled therapeutic intervention in order to improve on the following deficits  Abnormal gait;Decreased balance;Decreased cognition;Decreased coordination;Decreased strength;Difficulty walking;Dizziness;Impaired sensation;Impaired vision/preception;Pain  Rehab Potential Good  PT Frequency 2x / week  PT Duration 8 weeks  PT Treatment/Interventions ADLs/Self Care Home Management;Electrical Stimulation;DME Instruction;Gait training;Stair training;Functional mobility training;Therapeutic activities;Therapeutic exercise;Balance training;Neuromuscular re-education;Cognitive remediation;Patient/family education;Orthotic Fit/Training;Vestibular;Visual/perceptual remediation/compensation  PT Next Visit Plan ASSESS VITALS EACH VISIT; review vestibular HEP and progress if able, GAIT TRAINING WITH ROLLATOR, NMR and balance training: weight shifting to L, WB for proprioceptive feedback, coordination  Consulted and Agree with Plan of Care Patient;Family member/caregiver  Family Member Consulted husband          Patient will benefit from skilled therapeutic intervention in order to improve the following deficits and impairments:  (P) Abnormal gait, Decreased balance, Decreased cognition, Decreased coordination, Decreased strength, Difficulty walking, Dizziness, Impaired sensation, Impaired vision/preception, Pain  Visit Diagnosis: Other abnormalities of gait and mobility  Unsteadiness on feet  Dizziness and giddiness     Problem List Patient Active Problem List   Diagnosis Date Noted  . Weakness with dizziness-  since [facility] and CVA 04/07/17 08/07/2017  . Disturbances of vision, late effect of stroke 08/06/2017  . Health education/counseling 08/05/2017  . High risk medications (not anticoagulants) long-term use 08/05/2017  . Neuritis-  R sided:  arm and leg/ body due to stroke 08/05/2017  . Elevated LDL cholesterol level 07/11/2017  . [employer] ([employer]) Stroke  Scale limb ataxia score 2, ataxia present in two limbs 06/25/2017  . Alteration of sensation as late  effect of stroke 06/25/2017  . Elevated vitamin B12 level 05/30/2017  . Vitamin D deficiency 05/29/2017  . History of tobacco abuse-  30pk yr hx - quit 04/07/17 05/21/2017  . Gait disturbance, post-stroke 05/14/2017  . Benign essential HTN   . Vitreous hemorrhage of right eye ([name])   . Adjustment disorder with mixed anxiety and depressed mood   . 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 s/p SAH (subarachnoid hemorrhage) ([name]) 04/19/2017  . Basilar artery aneurysm ([name])   . Hypoxia   . 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 CVA (cerebral vascular accident) ([name]) 04/07/2017  . Elevated gastrin level 01/25/2012  . Hypokalemia 01/21/2012  . Nausea & vomiting 01/20/2012  . Epigastric pain 01/20/2012  . Duodenal ulcer, acute with obstruction 10/17/2011  . S/P laparoscopic cholecystectomy 10/16/2011    [name], PTA, [facility] [address] [place], [place] [address] [phone] 08/10/17, 1:28 AM   Name: [name] Barnett MRN: [id number] Date of Birth: [birth year]

## 2017-08-14 ENCOUNTER — Ambulatory Visit: Payer: BLUE CROSS/BLUE SHIELD | Admitting: Physical Therapy

## 2017-08-14 ENCOUNTER — Encounter: Payer: BLUE CROSS/BLUE SHIELD | Admitting: Occupational Therapy

## 2017-08-15 ENCOUNTER — Ambulatory Visit: Payer: BLUE CROSS/BLUE SHIELD | Admitting: Rehabilitation

## 2017-08-15 ENCOUNTER — Ambulatory Visit: Payer: BLUE CROSS/BLUE SHIELD | Admitting: Occupational Therapy

## 2017-08-16 ENCOUNTER — Ambulatory Visit: Payer: BLUE CROSS/BLUE SHIELD | Attending: Physical Medicine & Rehabilitation | Admitting: Rehabilitation

## 2017-08-16 ENCOUNTER — Ambulatory Visit: Payer: BLUE CROSS/BLUE SHIELD | Admitting: Occupational Therapy

## 2017-08-16 ENCOUNTER — Encounter: Payer: Self-pay | Admitting: Rehabilitation

## 2017-08-16 VITALS — BP 135/87

## 2017-08-16 DIAGNOSIS — R42 Dizziness and giddiness: Secondary | ICD-10-CM | POA: Insufficient documentation

## 2017-08-16 DIAGNOSIS — R208 Other disturbances of skin sensation: Secondary | ICD-10-CM | POA: Insufficient documentation

## 2017-08-16 DIAGNOSIS — I69018 Other symptoms and signs involving cognitive functions following nontraumatic subarachnoid hemorrhage: Secondary | ICD-10-CM | POA: Insufficient documentation

## 2017-08-16 DIAGNOSIS — R270 Ataxia, unspecified: Secondary | ICD-10-CM | POA: Diagnosis present

## 2017-08-16 DIAGNOSIS — I69054 Hemiplegia and hemiparesis following nontraumatic subarachnoid hemorrhage affecting left non-dominant side: Secondary | ICD-10-CM | POA: Insufficient documentation

## 2017-08-16 DIAGNOSIS — R278 Other lack of coordination: Secondary | ICD-10-CM | POA: Diagnosis present

## 2017-08-16 DIAGNOSIS — R2689 Other abnormalities of gait and mobility: Secondary | ICD-10-CM | POA: Insufficient documentation

## 2017-08-16 DIAGNOSIS — R414 Neurologic neglect syndrome: Secondary | ICD-10-CM | POA: Diagnosis present

## 2017-08-16 DIAGNOSIS — R41840 Attention and concentration deficit: Secondary | ICD-10-CM | POA: Insufficient documentation

## 2017-08-16 DIAGNOSIS — R41842 Visuospatial deficit: Secondary | ICD-10-CM | POA: Insufficient documentation

## 2017-08-16 DIAGNOSIS — R2681 Unsteadiness on feet: Secondary | ICD-10-CM | POA: Diagnosis present

## 2017-08-16 DIAGNOSIS — R260 Ataxic gait: Secondary | ICD-10-CM | POA: Diagnosis present

## 2017-08-16 NOTE — Therapy (Signed)
[facility] [address] [place], [place], [address] Phone: [phone]   Fax:  [phone]  Physical Therapy Treatment  Patient Details  Name: KRISTY SCHOMBURG MRN: [id number] Date of Birth: [birth year] Referring Provider: Charlett Blake, MD  Encounter Date: 08/16/2017      PT End of Session - 08/16/17 1405    Visit Number 6   Number of Visits 17   Date for PT Re-Evaluation 09/13/17   Authorization Type BCBS   PT Start Time 1401   PT Stop Time 0350   PT Time Calculation (min) 44 min   Equipment Utilized During Treatment Gait belt   Activity Tolerance Patient tolerated treatment well   Behavior During Therapy Restless;WFL for tasks assessed/performed;Impulsive      Past Medical History:  Diagnosis Date  . Duodenal obstruction   . GERD (gastroesophageal reflux disease)   . Hypertension     Past Surgical History:  Procedure Laterality Date  . ABDOMINAL HYSTERECTOMY    . BALLOON DILATION  12/31/2011   Procedure: BALLOON DILATION;  Surgeon: Missy Sabins, MD;  Location: [facility] ENDOSCOPY;  Service: Endoscopy;  Laterality: N/A;  . CHOLECYSTECTOMY  07/28/11  . ESOPHAGOGASTRODUODENOSCOPY  10/17/2011   Procedure: ESOPHAGOGASTRODUODENOSCOPY (EGD);  Surgeon: Missy Sabins, MD;  Location: [facility] ENDOSCOPY;  Service: Endoscopy;  Laterality: N/A;  . IR ANGIO INTRA EXTRACRAN SEL INTERNAL CAROTID BILAT MOD SED  04/07/2017  . IR ANGIO VERTEBRAL SEL VERTEBRAL UNI R MOD SED  04/07/2017  . IR ANGIOGRAM FOLLOW UP STUDY  04/07/2017  . IR ANGIOGRAM FOLLOW UP STUDY  04/07/2017  . IR ANGIOGRAM FOLLOW UP STUDY  04/07/2017  . IR ANGIOGRAM FOLLOW UP STUDY  04/07/2017  . IR ANGIOGRAM FOLLOW UP STUDY  04/07/2017  . IR ANGIOGRAM SELECTIVE EACH ADDITIONAL VESSEL  04/07/2017  . IR TRANSCATH/EMBOLIZ  04/07/2017  . PARS PLANA VITRECTOMY Right 05/01/2017   Procedure: PARS PLANA VITRECTOMY WITH 25 GAUGE RIGHT EYE, endolaser photocoaglation;  Surgeon: Jalene Mullet, MD;  Location: [facility];  Service: Ophthalmology;  Laterality: Right;  . RADIOLOGY WITH ANESTHESIA N/A 04/07/2017   Procedure: RADIOLOGY WITH ANESTHESIA;  Surgeon: Consuella Lose, MD;  Location: [facility];  Service: Radiology;  Laterality: N/A;  . REPLACEMENT TOTAL KNEE BILATERAL  2004  . TEMPOROMANDIBULAR JOINT SURGERY     2 surgeries  . TUMOR REMOVAL    . VAGOTOMY  01/23/2012   Procedure: VAGOTOMY, antrectomy and BII;  Surgeon: Haywood Lasso, MD;  Location: [facility];  Service: General;  Laterality: N/A;  Laparotomy with vagotomy.    There were no vitals filed for this visit.      Subjective Assessment - 08/16/17 1403    Subjective Reports no falls.  Note that she got prism glassess end of Aug and since wearing has felt really bad with increased dizziness.  BP WFL.     Patient is accompained by: Family member   Limitations Standing;Walking   Currently in Pain? Yes   Pain Score 3    Pain Location Leg   Pain Orientation Right   Pain Descriptors / Indicators Pins and needles   Pain Type Chronic pain   Pain Onset More than a month ago   Pain Frequency Constant   Aggravating Factors  being still for too long   Pain Relieving Factors neurontin, movement                          [facility] Adult  PT Treatment/Exercise - 08/16/17 0001      Transfers   Comments Pt able to perform sit<>stand at S level without UE support during session and remain standing statically without support x 2 mins, meeting STG.  Note that we did continue to practice standing during session following seated rest breaks.  Note that she continues to push to the R and locks out LLE for support.  Provided cues to bias weight shift forward and to the L for improved midline standing and balance.  She is able to self correct and improve during session, however was unable to maintain at end of session due to increased fatigue.       Ambulation/Gait   Ambulation/Gait Yes   Ambulation/Gait Assistance 4:  Min guard   Ambulation/Gait Assistance Details Performed gait with RW x 150' during session to address STG.  Pt able to ambulate at min/guard level, no assist for guiding RW (only cues) and moderate cues for position to RW, posture, improving L lateral and forward weight shift.     Ambulation Distance (Feet) 150 Feet   Assistive device Rolling walker   Gait Pattern Step-through pattern;Decreased stride length;Ataxic;Narrow base of support   Ambulation Surface Level;Indoor     Standardized Balance Assessment   Standardized Balance Assessment Berg Balance Test     Berg Balance Test   Sit to Stand Able to stand  independently using hands   Standing Unsupported Able to stand 2 minutes with supervision   Sitting with Back Unsupported but Feet Supported on Floor or Stool Able to sit safely and securely 2 minutes   Stand to Sit Controls descent by using hands   Transfers Needs one person to assist   Standing Unsupported with Eyes Closed Able to stand 10 seconds with supervision   Standing Ubsupported with Feet Together Needs help to attain position but able to stand for 30 seconds with feet together   From Standing, Reach Forward with Outstretched Arm Reaches forward but needs supervision   From Standing Position, Pick up Object from Floor Unable to pick up and needs supervision   From Standing Position, Turn to Look Behind Over each Shoulder Needs supervision when turning   Turn 360 Degrees Needs assistance while turning   Standing Unsupported, Alternately Place Feet on Step/Stool Needs assistance to keep from falling or unable to try   Standing Unsupported, One Foot in Front Needs help to step but can hold 15 seconds   Standing on One Leg Unable to try or needs assist to prevent fall   Total Score 22     Self-Care   Self-Care Other Self-Care Comments   Other Self-Care Comments  Continue to educate on recovery from CVA related to dizziness and visual/vestibular symptoms.       Neuro Re-ed     Neuro Re-ed Details  Due to pt having increased difficulty with weight shifting when turning during BERG balance test, broke this task down during session with chair in front of pt for support with pt making 90 deg turns to the L and back to midline with cues for stepping, weight shift, and stepping opposite leg.  Pt with increased dizziness therefore had her perform visual compensation of turning eyes then head to reduce symtpoms as much as possible.  Performed to the L and to midline x 6 reps before needing to sit due to increased dizziness.                  PT Education - 08/16/17   2000    Education provided Yes   Education Details See self care   Person(s) Educated Patient;Spouse   Methods Explanation   Comprehension Verbalized understanding          PT Short Term Goals - 08/16/17 1411      PT SHORT TERM GOAL #1   Title Pt will participate in vestibular assessment and assessment of gait velocity with LTG to be set    Time 4   Period Weeks   Status Achieved     PT SHORT TERM GOAL #2   Title Pt will improve balance and midline orientation to perform sit <> stand with supervision and be able to maintain static standing x 2 minutes without UE support and no LOB to R   Baseline met 08/16/17   Time 4   Period Weeks   Status Achieved     PT SHORT TERM GOAL #3   Title Pt will decrease falls risk as indicated by increase in BERG balance score to > or = 22/56   Baseline 17/56 to 22/56 on 08/16/17   Time 4   Period Weeks   Status Achieved     PT SHORT TERM GOAL #4   Title Pt will ambulate x 150' on indoor surfaces with LRAD and min A and require <10% assistance to maintain AD in straight path and <10% verbal cues for gait pattern.   Baseline partially met, felt that she needed mod cues for safe gait pattern.    Time 4   Period Weeks   Status Partially Met           PT Long Term Goals - 08/01/17 1447      PT LONG TERM GOAL #1   Title Pt and husband will demonstrate  independence with HEP   Time 8   Period Weeks   Status New   Target Date 09/13/17     PT LONG TERM GOAL #2   Title Pt will demonstrate improved balance and decreased falls risk as indicated by BERG balance score of > or = 27/56   Time 8   Period Weeks   Status New   Target Date 09/13/17     PT LONG TERM GOAL #3   Title Pt will ambulate with LRAD x 300' over level indoor and paved outdoor surfaces with supervision and intermittent cues for gait sequence   Time 8   Period Weeks   Status New   Target Date 09/13/17     PT LONG TERM GOAL #4   Title Pt will report 15% improvement in Neuro QOL-LE   Baseline 31.3%   Time 8   Period Weeks   Status New   Target Date 09/13/17     PT LONG TERM GOAL #5   Title Pt will decrease falls risk during gait in home/community as indicated by increase in gait velocity to > or = 1.65f/sec with LRAD   Baseline .865ft/sec with RW and up to mod A on 07/29/17   Time 8   Period Weeks   Status New   Target Date 09/13/17     PT LONG TERM GOAL #6   Title Pt will report <2/10 dizziness with head movements and will demonstrate improved gaze stability as indicated by ability to perform x 1 viewing HEP x 60 seconds in each direction   Time 8   Period Weeks   Status Revised   Target Date 09/13/17  Plan - 08/16/17 2001    Clinical Impression Statement Todays session focused on addressing STGs.  Pt has met 3/4 STGs, partially meeting 4th goal for gait due to needing increased cues for gait pattern.  Pt making continued progress towards LTGs.  Pt limited due to visual/vestibular deficits and impulsivity/decreased attention.     Rehab Potential Good   PT Frequency 2x / week   PT Duration 8 weeks   PT Treatment/Interventions ADLs/Self Care Home Management;Electrical Stimulation;DME Instruction;Gait training;Stair training;Functional mobility training;Therapeutic activities;Therapeutic exercise;Balance training;Neuromuscular  re-education;Cognitive remediation;Patient/family education;Orthotic Fit/Training;Vestibular;Visual/perceptual remediation/compensation   PT Next Visit Plan ASSESS VITALS EACH VISIT; review vestibular HEP and progress if able, gait without device as able to improve technique. , NMR and balance training: weight shifting to L, WB for proprioceptive feedback, coordination   Consulted and Agree with Plan of Care Patient;Family member/caregiver   Family Member Consulted husband      Patient will benefit from skilled therapeutic intervention in order to improve the following deficits and impairments:  Abnormal gait, Decreased balance, Decreased cognition, Decreased coordination, Decreased strength, Difficulty walking, Dizziness, Impaired sensation, Impaired vision/preception, Pain  Visit Diagnosis: Other abnormalities of gait and mobility  Unsteadiness on feet  Dizziness and giddiness  Other lack of coordination     Problem List Patient Active Problem List   Diagnosis Date Noted  . Weakness with dizziness-  since SAH and CVA 04/07/17 08/07/2017  . Disturbances of vision, late effect of stroke 08/06/2017  . Health education/counseling 08/05/2017  . High risk medications (not anticoagulants) long-term use 08/05/2017  . Neuritis-  R sided:  arm and leg/ body due to stroke 08/05/2017  . Elevated LDL cholesterol level 07/11/2017  . National Institutes of Health (NIH) Stroke Scale limb ataxia score 2, ataxia present in two limbs 06/25/2017  . 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 Elevated vitamin B12 level 05/30/2017  . Vitamin D deficiency 05/29/2017  . History of tobacco abuse-  30pk yr hx - quit 04/07/17 05/21/2017  . Gait disturbance, post-stroke 05/14/2017  . Benign essential HTN   . Vitreous hemorrhage of right eye ([name])   . Adjustment disorder with mixed anxiety and depressed mood   . 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 Terson syndrome  of both eyes ([name]) 04/23/2017  . s/p SAH (subarachnoid hemorrhage) ([name]) 04/19/2017  . Basilar artery aneurysm ([name])   . Hypoxia   . 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 CVA (cerebral vascular accident) ([name]) 04/07/2017  . Elevated gastrin level 01/25/2012  . Hypokalemia 01/21/2012  . Nausea & vomiting 01/20/2012  . Epigastric pain 01/20/2012  . Duodenal ulcer, acute with obstruction 10/17/2011  . S/P laparoscopic cholecystectomy 10/16/2011     , PT, MPT [facility] [address] [place], , [address] Phone: [phone]   Fax:  [phone] 08/16/17, 8:05 PM  Name: Janelie F Aldava MRN: [id number] Date of Birth: 05/29/1976   

## 2017-08-16 NOTE — Therapy (Signed)
[facility] [address] [place], [place], [address] Phone: [phone]   Fax:  [phone]  Occupational Therapy Treatment  Patient Details  Name: [name] Barnett MRN: [id number] Date of Birth: 04/12/1976 Referring Provider: Dr. Claudette LawsAndrew Kirsteins  Encounter Date: 08/16/2017      OT End of Session - 08/16/17 1515    Visit Number 6   Number of Visits 17   Authorization Type BCBS, no visit limit/auth req.   OT Start Time 1318   OT Stop Time 1400   OT Time Calculation (min) 42 min   Behavior During Therapy WFL for tasks assessed/performed      Past Medical History:  Diagnosis Date  . Duodenal obstruction   . GERD (gastroesophageal reflux disease)   . Hypertension     Past Surgical History:  Procedure Laterality Date  . ABDOMINAL HYSTERECTOMY    . BALLOON DILATION  12/31/2011   Procedure: BALLOON DILATION;  Surgeon: Barrie FolkJohn C Hayes, MD;  [facility];  Service: Endoscopy;  Laterality: N/A;  . CHOLECYSTECTOMY  07/28/11  . ESOPHAGOGASTRODUODENOSCOPY  10/17/2011   Procedure: ESOPHAGOGASTRODUODENOSCOPY (EGD);  Surgeon: Barrie FolkJohn C Hayes, MD;  [facility];  Service: Endoscopy;  Laterality: N/A;  . IR ANGIO INTRA EXTRACRAN SEL INTERNAL CAROTID BILAT MOD SED  04/07/2017  . IR ANGIO VERTEBRAL SEL VERTEBRAL UNI R MOD SED  04/07/2017  . IR ANGIOGRAM FOLLOW UP STUDY  04/07/2017  . IR ANGIOGRAM FOLLOW UP STUDY  04/07/2017  . IR ANGIOGRAM FOLLOW UP STUDY  04/07/2017  . IR ANGIOGRAM FOLLOW UP STUDY  04/07/2017  . IR ANGIOGRAM FOLLOW UP STUDY  04/07/2017  . IR ANGIOGRAM SELECTIVE EACH ADDITIONAL VESSEL  04/07/2017  . IR TRANSCATH/EMBOLIZ  04/07/2017  . PARS PLANA VITRECTOMY Right 05/01/2017   Procedure: PARS PLANA VITRECTOMY WITH 25 GAUGE RIGHT EYE, endolaser photocoaglation;  Surgeon: Carmela RimaPatel, Narendra, MD;  [facility];  Service: Ophthalmology;  Laterality: Right;  . RADIOLOGY WITH ANESTHESIA N/A 04/07/2017   Procedure:  RADIOLOGY WITH ANESTHESIA;  Surgeon: [name], MD;  [facility];  Service: Radiology;  Laterality: N/A;  . REPLACEMENT TOTAL KNEE BILATERAL  2004  . TEMPOROMANDIBULAR JOINT SURGERY     2 surgeries  . TUMOR REMOVAL    . VAGOTOMY  01/23/2012   Procedure: VAGOTOMY, antrectomy and BII;  Surgeon: [name], MD;  Location: [facility] OR;  Service: General;  Laterality: N/A;  Laparotomy with vagotomy.    Vitals:   08/16/17 1322  BP: 135/87        Subjective Assessment - 08/16/17 1514    Pertinent History Subarachnoid hemorrhage due to ruptured aneurysm, ataxia (L cerebellar); HTN, R side decr sensation, vertical diplopia/visual deficits, ataxia, Bilateral retinal hemorhage associated with SAH (with surgery on R eye in hospital and L approx 1 month ago)   Limitations visual deficits, fall risk, impulsivity/cognitive deficits   Patient Stated Goals be able to walk and use L arm   Currently in Pain? Yes   Pain Score 3    Pain Location Leg   Pain Orientation Right   Pain Descriptors / Indicators Tingling   Pain Type Chronic pain   Aggravating Factors  not moving   Pain Relieving Factors meds       Treatment: Using LUE to perform the following: flipping playing cards, placing and removing large pegs, stacking blocks then placing in a container with LUE,  mod v.c for performance and sitting at midline rather than leaning to right.  OT Short Term Goals - 07/16/17 1221      OT SHORT TERM GOAL #1   Title Pt/caregiver will be independent with initial HEP.--check STGs 08/14/17   Time 4   Period Weeks   Status New     OT SHORT TERM GOAL #2   Title Pt will be use LUE as nondominant assist at least 50% of the time for ADLs without cueing.   Time 4   Period Weeks   Status New     OT SHORT TERM GOAL #3   Title Pt will improve LUE coordination/control to improve score on box and blocks test by at least 6 with LUE.   Baseline 19 blocks    Time 4   Period Weeks   Status New     OT SHORT TERM GOAL #4   Title Pt will perform simple home maintenance task/snack prep in sitting with set-up.   Time 4   Period Weeks   Status New     OT SHORT TERM GOAL #5   Title Pt/husband will verbalize understanding of visual compensation stategies for improved safety/incr ease with ADLs.   Time 4   Period Weeks   Status New           OT Long Term Goals - 07/16/17 1907      OT LONG TERM GOAL #1   Title Pt/caregiver will be independent with updated HEP.--check LTGs 09/13/17   Time 8   Period Weeks   Status New     OT LONG TERM GOAL #2   Title Pt will be use LUE as nondominant assist at least 75% of the time for ADLs without cueing.   Time 8   Period Weeks   Status New     OT LONG TERM GOAL #3   Title Pt will perform toileting mod I (including transfer).   Time 8   Period Weeks   Status New     OT LONG TERM GOAL #4   Title Pt will improve LUE coordination/control to improve score on box and blocks test by at least 12 with LUE.   Baseline 19 blocks   Time 8   Period Weeks   Status New     OT LONG TERM GOAL #5   Title Pt will perform simple home maintenance task/snack prep in standing with set-up/supervision.   Time 8   Period Weeks   Status New     OT LONG TERM GOAL #6   Title Pt will perform simple environmental scanning/navigation with supervision.   Time 8   Period Weeks   Status New               Plan - 08/16/17 1516    Clinical Impression Statement Pt reports feeling dizzy for several days after receiving prisms. Therapist recommended pt saves prism use for therapy on the days she attends therapy. Pt was unable to tolerate today.   Rehab Potential Good   Current Impairments/barriers affecting progress: cognitive deficits, impulsivity   OT Frequency 2x / week   OT Duration 8 weeks   OT Treatment/Interventions Self-care/ADL training;DME and/or AE instruction;Patient/family education;Therapeutic  exercises;Balance training;Fluidtherapy;Moist Heat;Ultrasound;Therapeutic exercise;Therapeutic activities;Cryotherapy;Neuromuscular education;Functional Mobility Training;Passive range of motion;Cognitive remediation/compensation;Visual/perceptual remediation/compensation;Manual Therapy   Plan LUE functional use/ awareness   OT Home Exercise Plan Education provided:  HEP for LUE control, coordination, and functional use   Consulted and Agree with Plan of Care Patient;Family member/caregiver   Family Member Consulted husband      Patient  will benefit from skilled therapeutic intervention in order to improve the following deficits and impairments:  Decreased coordination, Decreased range of motion, Difficulty walking, Abnormal gait, Decreased safety awareness, Impaired sensation, Decreased knowledge of precautions, Decreased balance, Decreased knowledge of use of DME, Impaired UE functional use, Decreased cognition, Decreased mobility, Decreased strength, Impaired vision/preception, Impaired perceived functional ability  Visit Diagnosis: Other lack of coordination  Other disturbances of skin sensation  Visuospatial deficit  Attention and concentration deficit    Problem List Patient Active Problem List   Diagnosis Date Noted  . Weakness with dizziness-  since [facility] and CVA 04/07/17 08/07/2017  . Disturbances of vision, late effect of stroke 08/06/2017  . Health education/counseling 08/05/2017  . High risk medications (not anticoagulants) long-term use 08/05/2017  . Neuritis-  R sided:  arm and leg/ body due to stroke 08/05/2017  . Elevated LDL cholesterol level 07/11/2017  . [employer] (NIH) Stroke Scale limb ataxia score 2, ataxia present in two limbs 06/25/2017  . 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 Elevated vitamin B12 level 05/30/2017  . Vitamin D deficiency 05/29/2017  . History of tobacco abuse-  30pk yr hx - quit 04/07/17 05/21/2017  .  Gait disturbance, post-stroke 05/14/2017  . Benign essential HTN   . Vitreous hemorrhage of right eye ([name])   . Adjustment disorder with mixed anxiety and depressed mood   . 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 s/p SAH (subarachnoid hemorrhage) ([name]) 04/19/2017  . Basilar artery aneurysm ([name])   . Hypoxia   . 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 CVA (cerebral vascular accident) ([name]) 04/07/2017  . Elevated gastrin level 01/25/2012  . Hypokalemia 01/21/2012  . Nausea & vomiting 01/20/2012  . Epigastric pain 01/20/2012  . Duodenal ulcer, acute with obstruction 10/17/2011  . S/P laparoscopic cholecystectomy 10/16/2011    [name] Barnett 08/16/2017, 3:18 PM  [facility] [address] [place], [place], [address] Phone: [phone]   Fax:  [phone]  Name: [name] Barnett MRN: [id number] Date of Birth: September 26, 1976

## 2017-08-19 ENCOUNTER — Ambulatory Visit: Payer: BLUE CROSS/BLUE SHIELD | Admitting: Physical Therapy

## 2017-08-19 ENCOUNTER — Ambulatory Visit: Payer: BLUE CROSS/BLUE SHIELD | Admitting: Occupational Therapy

## 2017-08-19 VITALS — BP 140/82

## 2017-08-19 DIAGNOSIS — R278 Other lack of coordination: Secondary | ICD-10-CM

## 2017-08-19 DIAGNOSIS — R2681 Unsteadiness on feet: Secondary | ICD-10-CM

## 2017-08-19 DIAGNOSIS — R2689 Other abnormalities of gait and mobility: Secondary | ICD-10-CM

## 2017-08-19 DIAGNOSIS — I69054 Hemiplegia and hemiparesis following nontraumatic subarachnoid hemorrhage affecting left non-dominant side: Secondary | ICD-10-CM

## 2017-08-19 DIAGNOSIS — R414 Neurologic neglect syndrome: Secondary | ICD-10-CM

## 2017-08-19 DIAGNOSIS — R41840 Attention and concentration deficit: Secondary | ICD-10-CM

## 2017-08-19 DIAGNOSIS — R260 Ataxic gait: Secondary | ICD-10-CM

## 2017-08-19 DIAGNOSIS — R41842 Visuospatial deficit: Secondary | ICD-10-CM

## 2017-08-19 DIAGNOSIS — R42 Dizziness and giddiness: Secondary | ICD-10-CM

## 2017-08-19 DIAGNOSIS — I69018 Other symptoms and signs involving cognitive functions following nontraumatic subarachnoid hemorrhage: Secondary | ICD-10-CM

## 2017-08-19 DIAGNOSIS — R270 Ataxia, unspecified: Secondary | ICD-10-CM

## 2017-08-19 DIAGNOSIS — R208 Other disturbances of skin sensation: Secondary | ICD-10-CM

## 2017-08-19 NOTE — Therapy (Signed)
[facility] [address] [place], [place], [address] Phone: [phone]   Fax:  [phone]  Occupational Therapy Treatment  Patient Details  Name: [name] Barnett MRN: [id number] Date of Birth: [birth year] Referring Provider: Dr. Alysia Penna  Encounter Date: 08/19/2017      OT End of Session - 08/19/17 1628    Visit Number 7   Number of Visits 17   Date for OT Re-Evaluation 09/13/17   Authorization Type BCBS, no visit limit/auth req.   OT Start Time 1451   OT Stop Time 1532   OT Time Calculation (min) 41 min   Activity Tolerance Patient tolerated treatment well   Behavior During Therapy WFL for tasks assessed/performed      Past Medical History:  Diagnosis Date  . Duodenal obstruction   . GERD (gastroesophageal reflux disease)   . Hypertension     Past Surgical History:  Procedure Laterality Date  . ABDOMINAL HYSTERECTOMY    . BALLOON DILATION  12/31/2011   Procedure: BALLOON DILATION;  Surgeon: Missy Sabins, MD;  Location: [facility] ENDOSCOPY;  Service: Endoscopy;  Laterality: N/A;  . CHOLECYSTECTOMY  07/28/11  . ESOPHAGOGASTRODUODENOSCOPY  10/17/2011   Procedure: ESOPHAGOGASTRODUODENOSCOPY (EGD);  Surgeon: Missy Sabins, MD;  Location: [facility] ENDOSCOPY;  Service: Endoscopy;  Laterality: N/A;  . IR ANGIO INTRA EXTRACRAN SEL INTERNAL CAROTID BILAT MOD SED  04/07/2017  . IR ANGIO VERTEBRAL SEL VERTEBRAL UNI R MOD SED  04/07/2017  . IR ANGIOGRAM FOLLOW UP STUDY  04/07/2017  . IR ANGIOGRAM FOLLOW UP STUDY  04/07/2017  . IR ANGIOGRAM FOLLOW UP STUDY  04/07/2017  . IR ANGIOGRAM FOLLOW UP STUDY  04/07/2017  . IR ANGIOGRAM FOLLOW UP STUDY  04/07/2017  . IR ANGIOGRAM SELECTIVE EACH ADDITIONAL VESSEL  04/07/2017  . IR TRANSCATH/EMBOLIZ  04/07/2017  . PARS PLANA VITRECTOMY Right 05/01/2017   Procedure: PARS PLANA VITRECTOMY WITH 25 GAUGE RIGHT EYE, endolaser photocoaglation;  Surgeon: Jalene Mullet, MD;  Location: [facility];  Service:  Ophthalmology;  Laterality: Right;  . RADIOLOGY WITH ANESTHESIA N/A 04/07/2017   Procedure: RADIOLOGY WITH ANESTHESIA;  Surgeon: Consuella Lose, MD;  Location: [facility];  Service: Radiology;  Laterality: N/A;  . REPLACEMENT TOTAL KNEE BILATERAL  2004  . TEMPOROMANDIBULAR JOINT SURGERY     2 surgeries  . TUMOR REMOVAL    . VAGOTOMY  01/23/2012   Procedure: VAGOTOMY, antrectomy and BII;  Surgeon: Haywood Lasso, MD;  Location: [facility];  Service: General;  Laterality: N/A;  Laparotomy with vagotomy.    There were no vitals filed for this visit.      Subjective Assessment - 08/19/17 1454    Subjective  I feel better now.     Pertinent History Subarachnoid hemorrhage due to ruptured aneurysm, ataxia (L cerebellar); HTN, R side decr sensation, vertical diplopia/visual deficits, ataxia, Bilateral retinal hemorhage associated with SAH (with surgery on R eye in hospital and L approx 1 month ago)   Limitations visual deficits, fall risk, impulsivity/cognitive deficits   Patient Stated Goals be able to walk and use L arm   Currently in Pain? No/denies        Ambulated to gym with close supervision/min cueing to avoid going to the L (with RW).   Then ambulating to sink to wash hands with min A/cues for impulsivity.    22M letter cancellation sheet with min cueing with prism glasses.  Pt with decr tolerance to glasses but did well with encouragement and cueing to  slow down.  Recommended pt perform letter cancellation for large print word searches while wearing prism glasses for short periods of time at home.   Making a peanut butter and jelly sandwich with set-up, prompts for encouragement (as pt gets anxious about not doing well) and cueing to slow down.  Placing Perfection pieces in board with LUE with mod cueing to slow down for incr control and while wearing prism glasses.  Pt reports that prism glasses did not bother her as much after she wore them the 2nd time.   Began checking goals and  discussing progress.--see goals section.                        OT Short Term Goals - 08/19/17 1509      OT SHORT TERM GOAL #1   Title Pt/caregiver will be independent with initial HEP.--check STGs 08/14/17   Time 4   Period Weeks   Status Achieved  08/19/17:  with cueing from husband     OT [place] #2   Title Pt will be use LUE as nondominant assist at least 50% of the time for ADLs without cueing.   Time 4   Period Weeks   Status On-going  08/19/17:  approx 40% of the time     OT SHORT TERM GOAL #3   Title Pt will improve LUE coordination/control to improve score on box and blocks test by at least 6 with LUE.   Baseline 19 blocks   Time 4   Period Weeks   Status New     OT SHORT TERM GOAL #4   Title Pt will perform simple home maintenance task/snack prep in sitting with set-up.   Time 4   Period Weeks   Status Achieved  08/19/17:  met in clinic with set-up and cueing/prompts for encouragement/to slow down     OT SHORT TERM GOAL #5   Title Pt/husband will verbalize understanding of visual compensation stategies for improved safety/incr ease with ADLs.   Time 4   Period Weeks   Status New           OT Long Term Goals - 07/16/17 1907      OT LONG TERM GOAL #1   Title Pt/caregiver will be independent with updated HEP.--check LTGs 09/13/17   Time 8   Period Weeks   Status New     OT LONG TERM GOAL #2   Title Pt will be use LUE as nondominant assist at least 75% of the time for ADLs without cueing.   Time 8   Period Weeks   Status New     OT LONG TERM GOAL #3   Title Pt will perform toileting mod I (including transfer).   Time 8   Period Weeks   Status New     OT LONG TERM GOAL #4   Title Pt will improve LUE coordination/control to improve score on box and blocks test by at least 12 with LUE.   Baseline 19 blocks   Time 8   Period Weeks   Status New     OT LONG TERM GOAL #5   Title Pt will perform simple home maintenance  task/snack prep in standing with set-up/supervision.   Time 8   Period Weeks   Status New     OT LONG TERM GOAL #6   Title Pt will perform simple environmental scanning/navigation with supervision.   Time 8   Period Weeks  Status New               Plan - 08/19/17 1630    Clinical Impression Statement Pt/husband report incr use of LUE.  Pt is progressing towards goals however, impulsivity and axiety about LUE functional use are barriers.  Pt demo incr control when she slows down.   Rehab Potential Good   Current Impairments/barriers affecting progress: cognitive deficits, impulsivity   OT Frequency 2x / week   OT Duration 8 weeks   OT Treatment/Interventions Self-care/ADL training;DME and/or AE instruction;Patient/family education;Therapeutic exercises;Balance training;Fluidtherapy;Moist Heat;Ultrasound;Therapeutic exercise;Therapeutic activities;Cryotherapy;Neuromuscular education;Functional Mobility Training;Passive range of motion;Cognitive remediation/compensation;Visual/perceptual remediation/compensation;Manual Therapy   Plan check remaining STGs, continue with LUE functional use/control   OT Home Exercise Plan Education provided:  HEP for LUE control, coordination, and functional use   Consulted and Agree with Plan of Care Patient;Family member/caregiver   Family Member Consulted husband      Patient will benefit from skilled therapeutic intervention in order to improve the following deficits and impairments:  Decreased coordination, Decreased range of motion, Difficulty walking, Abnormal gait, Decreased safety awareness, Impaired sensation, Decreased knowledge of precautions, Decreased balance, Decreased knowledge of use of DME, Impaired UE functional use, Decreased cognition, Decreased mobility, Decreased strength, Impaired vision/preception, Impaired perceived functional ability  Visit Diagnosis: Other lack of coordination  Other disturbances of skin  sensation  Visuospatial deficit  Attention and concentration deficit  Unsteadiness on feet  Other abnormalities of gait and mobility  Dizziness and giddiness  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 Weakness with dizziness-  since [facility] and CVA 04/07/17 08/07/2017  . Disturbances of vision, late effect of stroke 08/06/2017  . Health education/counseling 08/05/2017  . High risk medications (not anticoagulants) long-term use 08/05/2017  . Neuritis-  R sided:  arm and leg/ body due to stroke 08/05/2017  . Elevated LDL cholesterol level 07/11/2017  . [employer] (NIH) Stroke Scale limb ataxia score 2, ataxia present in two limbs 06/25/2017  . 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 Elevated vitamin B12 level 05/30/2017  . Vitamin D deficiency 05/29/2017  . History of tobacco abuse-  30pk yr hx - quit 04/07/17 05/21/2017  . Gait disturbance, post-stroke 05/14/2017  . Benign essential HTN   . Vitreous hemorrhage of right eye ([place])   . Adjustment disorder with mixed anxiety and depressed mood   . 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 Terson syndrome of both eyes ([place]) 04/23/2017  . s/p SAH (subarachnoid hemorrhage) ([place]) 04/19/2017  . Basilar artery aneurysm ([place])   . Hypoxia   . Subarachnoid hemorrhage due to ruptured aneurysm ([place]) 04/07/2017  . CVA (cerebral vascular accident) ([place]) 04/07/2017  . Elevated gastrin level 01/25/2012  . Hypokalemia 01/21/2012  . Nausea & vomiting 01/20/2012  . Epigastric pain 01/20/2012  . Duodenal ulcer, acute with obstruction 10/17/2011  . S/P laparoscopic cholecystectomy 10/16/2011     [facility] 08/19/2017, 4:35 PM  [facility] [address] [place], [place], [address] Phone: [phone]   Fax:  [phone]  Name: [name] Barnett MRN: [id number] Date of Birth: 06-29-1976   Vianne Bulls, OTR/L [facility] [address] [place], [place]  [address] [phone] phone  [phone] 08/19/17 4:53 PM

## 2017-08-20 NOTE — Therapy (Signed)
[facility] [address] [place], [place], [address] Phone: [phone]   Fax:  [phone]  Physical Therapy Treatment  Patient Details  Name: [name] Barnett MRN: [id number] Date of Birth: January 26, 1976 Referring Provider: Charlett Blake, MD  Encounter Date: 08/19/2017      PT End of Session - 08/20/17 0721    Visit Number 7   Number of Visits 17   Date for PT Re-Evaluation 09/13/17   Authorization Type BCBS   PT Start Time 1532   PT Stop Time 1620   PT Time Calculation (min) 48 min   Activity Tolerance Patient tolerated treatment well   Behavior During Therapy [facility] for tasks assessed/performed;Impulsive      Past Medical History:  Diagnosis Date  . Duodenal obstruction   . GERD (gastroesophageal reflux disease)   . Hypertension     Past Surgical History:  Procedure Laterality Date  . ABDOMINAL HYSTERECTOMY    . BALLOON DILATION  12/31/2011   Procedure: BALLOON DILATION;  Surgeon: Missy Sabins, MD;  Location: [facility] ENDOSCOPY;  Service: Endoscopy;  Laterality: N/A;  . CHOLECYSTECTOMY  07/28/11  . ESOPHAGOGASTRODUODENOSCOPY  10/17/2011   Procedure: ESOPHAGOGASTRODUODENOSCOPY (EGD);  Surgeon: Missy Sabins, MD;  Location: [facility] ENDOSCOPY;  Service: Endoscopy;  Laterality: N/A;  . IR ANGIO INTRA EXTRACRAN SEL INTERNAL CAROTID BILAT MOD SED  04/07/2017  . IR ANGIO VERTEBRAL SEL VERTEBRAL UNI R MOD SED  04/07/2017  . IR ANGIOGRAM FOLLOW UP STUDY  04/07/2017  . IR ANGIOGRAM FOLLOW UP STUDY  04/07/2017  . IR ANGIOGRAM FOLLOW UP STUDY  04/07/2017  . IR ANGIOGRAM FOLLOW UP STUDY  04/07/2017  . IR ANGIOGRAM FOLLOW UP STUDY  04/07/2017  . IR ANGIOGRAM SELECTIVE EACH ADDITIONAL VESSEL  04/07/2017  . IR TRANSCATH/EMBOLIZ  04/07/2017  . PARS PLANA VITRECTOMY Right 05/01/2017   Procedure: PARS PLANA VITRECTOMY WITH 25 GAUGE RIGHT EYE, endolaser photocoaglation;  Surgeon: Jalene Mullet, MD;  Location: [facility];  Service: Ophthalmology;   Laterality: Right;  . RADIOLOGY WITH ANESTHESIA N/A 04/07/2017   Procedure: RADIOLOGY WITH ANESTHESIA;  Surgeon: Consuella Lose, MD;  Location: [facility];  Service: Radiology;  Laterality: N/A;  . REPLACEMENT TOTAL KNEE BILATERAL  2004  . TEMPOROMANDIBULAR JOINT SURGERY     2 surgeries  . TUMOR REMOVAL    . VAGOTOMY  01/23/2012   Procedure: VAGOTOMY, antrectomy and BII;  Surgeon: Haywood Lasso, MD;  Location: [facility];  Service: General;  Laterality: N/A;  Laparotomy with vagotomy.    Vitals:   08/20/17 0706  BP: 140/82        Subjective Assessment - 08/20/17 0706    Subjective No issues over the weekend; states that her "mind was blown" last week when she found out she couldn't turn (while performing re-assessment with another PT).     Patient is accompained by: Family member   Limitations Standing;Walking   Currently in Pain? No/denies                         [facility] Adult PT Treatment/Exercise - 08/20/17 7588      Neuro Re-ed    Neuro Re-ed Details  Continued to focus on turning/pivoting to L and R with improved motor planning and control: pt placed in center of 4 dots on floor for visual target.  Pt cued to look at target first, pivot closest foot to target, weight shift laterally and then step to meet.  Performed  5-6 revolutions to L and R with min-mod A for balance while weight shifting and verbal cues for sequencing.  Transitioned to sit <> stand training from chair leaning forward with UE support on mat to fully shift weight forwards and provide pt with 4 points of contact for greater proprioceptive feedback while performing sit <> partial stand to a count of 8 with mod A to facilitate grading of movement, to maintain forward weight shift and to weight shift to L due to pt leaning R when performing transition.  Remained in partial stand with WB through UE on mat performing isolated weight shifts to L: lifting RUE and RLE and then performing controlled weight shifting  to L and R with side stepping along mat in squat position, UE on mat.  Performed gait x 40' x 5-6 reps without use of RW but with support at LUE and R trunk to focus on carry over of weight shifting and pivoting technique-pt required verbal cues to decrease extension/pushing through LUE to allow L hip and knee flexion during swing and trunk elongation for weight shifting.                   PT Short Term Goals - 08/16/17 1411      PT SHORT TERM GOAL #1   Title Pt will participate in vestibular assessment and assessment of gait velocity with LTG to be set    Time 4   Period Weeks   Status Achieved     PT SHORT TERM GOAL #2   Title Pt will improve balance and midline orientation to perform sit <> stand with supervision and be able to maintain static standing x 2 minutes without UE support and no LOB to R   Baseline met 08/16/17   Time 4   Period Weeks   Status Achieved     PT SHORT TERM GOAL #3   Title Pt will decrease falls risk as indicated by increase in BERG balance score to > or = 22/56   Baseline 17/56 to 22/56 on 08/16/17   Time 4   Period Weeks   Status Achieved     PT SHORT TERM GOAL #4   Title Pt will ambulate x 150' on indoor surfaces with LRAD and min A and require <10% assistance to maintain AD in straight path and <10% verbal cues for gait pattern.   Baseline partially met, felt that she needed mod cues for safe gait pattern.    Time 4   Period Weeks   Status Partially Met           PT Long Term Goals - 08/01/17 1447      PT LONG TERM GOAL #1   Title Pt and husband will demonstrate independence with HEP   Time 8   Period Weeks   Status New   Target Date 09/13/17     PT LONG TERM GOAL #2   Title Pt will demonstrate improved balance and decreased falls risk as indicated by BERG balance score of > or = 27/56   Time 8   Period Weeks   Status New   Target Date 09/13/17     PT LONG TERM GOAL #3   Title Pt will ambulate with LRAD x 300' over level  indoor and paved outdoor surfaces with supervision and intermittent cues for gait sequence   Time 8   Period Weeks   Status New   Target Date 09/13/17     PT LONG TERM GOAL #  4   Title Pt will report 15% improvement in Neuro QOL-LE   Baseline 31.3%   Time 8   Period Weeks   Status New   Target Date 09/13/17     PT LONG TERM GOAL #5   Title Pt will decrease falls risk during gait in home/community as indicated by increase in gait velocity to > or = 1.1f/sec with LRAD   Baseline .866ft/sec with RW and up to mod A on 07/29/17   Time 8   Period Weeks   Status New   Target Date 09/13/17     PT LONG TERM GOAL #6   Title Pt will report <2/10 dizziness with head movements and will demonstrate improved gaze stability as indicated by ability to perform x 1 viewing HEP x 60 seconds in each direction   Time 8   Period Weeks   Status Revised   Target Date 09/13/17               Plan - 08/20/17 04235   Clinical Impression Statement Today's session continued to focus on NMR initiated at last session with focus on weight shifting, pivoting/changing direction, motor planning and grading of movement during transitions and gait training.  Pt with decreased visual/vestibular complaints today and pt demonstrating improved awareness of abnormal movement patterns.  Will continue to address.   Rehab Potential Good   PT Frequency 2x / week   PT Duration 8 weeks   PT Treatment/Interventions ADLs/Self Care Home Management;Electrical Stimulation;DME Instruction;Gait training;Stair training;Functional mobility training;Therapeutic activities;Therapeutic exercise;Balance training;Neuromuscular re-education;Cognitive remediation;Patient/family education;Orthotic Fit/Training;Vestibular;Visual/perceptual remediation/compensation   PT Next Visit Plan ASSESS VITALS EACH VISIT; review vestibular HEP and progress if able, gait without device as able to improve technique. , NMR and balance training: weight  shifting to L, WB for proprioceptive feedback, coordination   Consulted and Agree with Plan of Care Patient;Family member/caregiver   Family Member Consulted husband      Patient will benefit from skilled therapeutic intervention in order to improve the following deficits and impairments:  Abnormal gait, Decreased balance, Decreased cognition, Decreased coordination, Decreased strength, Difficulty walking, Dizziness, Impaired sensation, Impaired vision/preception, Pain  Visit Diagnosis: Ataxic gait  Other lack of coordination  Other abnormalities of gait and mobility  Dizziness and giddiness  Unsteadiness on feet     Problem List Patient Active Problem List   Diagnosis Date Noted  . [facility]  [facility] 08/[facility]  . Disturbances of vision, late effect of stroke 08/06/2017  . Health education/counseling 08/05/2017  . High risk medications (not anticoagulants) long-term use 08/05/2017  . Neuritis-  R sided:  arm and leg/ body due to stroke 08/05/2017  . Elevated LDL cholesterol level 07/11/2017  . [employer] (NIH[employer] Stroke Scale limb ataxia score 2, ataxia present in two limbs 06/25/2017  . 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 Elevated vitamin B12 level 05/30/2017  . Vitamin D deficiency 05/29/2017  . History of tobacco abuse-  30pk yr hx - quit 04/07/17 05/21/2017  . Gait disturbance, post-stroke 05/14/2017  . Benign essential HTN   . Vit[place] of right eye ([place]   . Adjustment disorder with mixed anxiety and depressed mood   . 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 Terson syndrome of both eyes ([place] 04/23/2017  . s/p SAH (subarachnoid hemorrhage) ([place] 04/19/2017  . Basilar artery aneurysm ([place]   . Hypoxia   . Subarachnoid hemorrhage due  to ruptured aneurysm ([place]) 04/07/2017  . CVA (cerebral vascular accident) ([place]) 04/07/2017  . Elevated gastrin level 01/25/2012  .  Hypokalemia 01/21/2012  . Nausea & vomiting 01/20/2012  . Epigastric pain 01/20/2012  . Duodenal ulcer, acute with obstruction 10/17/2011  . S/P laparoscopic cholecystectomy 10/16/2011    Raylene Everts, PT, DPT 08/20/17    7:26 AM    [facility] [address] [place], [place], [address] Phone: [phone]   Fax:  [phone]  Name: [name] Barnett MRN: [id number] Date of Birth: 1976-09-28

## 2017-08-22 ENCOUNTER — Encounter: Payer: Self-pay | Admitting: Rehabilitation

## 2017-08-22 ENCOUNTER — Ambulatory Visit: Payer: BLUE CROSS/BLUE SHIELD | Admitting: Rehabilitation

## 2017-08-22 ENCOUNTER — Ambulatory Visit: Payer: BLUE CROSS/BLUE SHIELD | Admitting: Occupational Therapy

## 2017-08-22 DIAGNOSIS — R270 Ataxia, unspecified: Secondary | ICD-10-CM

## 2017-08-22 DIAGNOSIS — I69018 Other symptoms and signs involving cognitive functions following nontraumatic subarachnoid hemorrhage: Secondary | ICD-10-CM

## 2017-08-22 DIAGNOSIS — R414 Neurologic neglect syndrome: Secondary | ICD-10-CM

## 2017-08-22 DIAGNOSIS — R2681 Unsteadiness on feet: Secondary | ICD-10-CM

## 2017-08-22 DIAGNOSIS — R260 Ataxic gait: Secondary | ICD-10-CM

## 2017-08-22 DIAGNOSIS — R2689 Other abnormalities of gait and mobility: Secondary | ICD-10-CM

## 2017-08-22 DIAGNOSIS — R278 Other lack of coordination: Secondary | ICD-10-CM

## 2017-08-22 DIAGNOSIS — R208 Other disturbances of skin sensation: Secondary | ICD-10-CM

## 2017-08-22 DIAGNOSIS — R41840 Attention and concentration deficit: Secondary | ICD-10-CM

## 2017-08-22 DIAGNOSIS — I69054 Hemiplegia and hemiparesis following nontraumatic subarachnoid hemorrhage affecting left non-dominant side: Secondary | ICD-10-CM

## 2017-08-22 DIAGNOSIS — R41842 Visuospatial deficit: Secondary | ICD-10-CM

## 2017-08-22 NOTE — Therapy (Signed)
[facility] [address] [place], [place], [address] Phone: [phone]   Fax:  [phone]  Occupational Therapy Treatment  Patient Details  Name: [name] Barnett MRN: [id number] Date of Birth: [birth year] Referring Provider: Dr. Alysia Penna  Encounter Date: 08/22/2017      OT End of Session - 08/22/17 1633    Visit Number 8   Number of Visits 17   Date for OT Re-Evaluation 09/13/17   Authorization Type BCBS, no visit limit/auth req.   OT Start Time 1335   OT Stop Time 1425   OT Time Calculation (min) 50 min   Activity Tolerance Patient tolerated treatment well   Behavior During Therapy WFL for tasks assessed/performed      Past Medical History:  Diagnosis Date  . Duodenal obstruction   . GERD (gastroesophageal reflux disease)   . Hypertension     Past Surgical History:  Procedure Laterality Date  . ABDOMINAL HYSTERECTOMY    . BALLOON DILATION  12/31/2011   Procedure: BALLOON DILATION;  Surgeon: Missy Sabins, MD;  Location: [facility] ENDOSCOPY;  Service: Endoscopy;  Laterality: N/A;  . CHOLECYSTECTOMY  07/28/11  . ESOPHAGOGASTRODUODENOSCOPY  10/17/2011   Procedure: ESOPHAGOGASTRODUODENOSCOPY (EGD);  Surgeon: Missy Sabins, MD;  Location: [facility] ENDOSCOPY;  Service: Endoscopy;  Laterality: N/A;  . IR ANGIO INTRA EXTRACRAN SEL INTERNAL CAROTID BILAT MOD SED  04/07/2017  . IR ANGIO VERTEBRAL SEL VERTEBRAL UNI R MOD SED  04/07/2017  . IR ANGIOGRAM FOLLOW UP STUDY  04/07/2017  . IR ANGIOGRAM FOLLOW UP STUDY  04/07/2017  . IR ANGIOGRAM FOLLOW UP STUDY  04/07/2017  . IR ANGIOGRAM FOLLOW UP STUDY  04/07/2017  . IR ANGIOGRAM FOLLOW UP STUDY  04/07/2017  . IR ANGIOGRAM SELECTIVE EACH ADDITIONAL VESSEL  04/07/2017  . IR TRANSCATH/EMBOLIZ  04/07/2017  . PARS PLANA VITRECTOMY Right 05/01/2017   Procedure: PARS PLANA VITRECTOMY WITH 25 GAUGE RIGHT EYE, endolaser photocoaglation;  Surgeon: Jalene Mullet, MD;  Location: [facility];  Service:  Ophthalmology;  Laterality: Right;  . RADIOLOGY WITH ANESTHESIA N/A 04/07/2017   Procedure: RADIOLOGY WITH ANESTHESIA;  Surgeon: Consuella Lose, MD;  Location: [facility] [facility];  Service: Radiology;  Laterality: N/A;  . REPLACEMENT TOTAL KNEE BILATERAL  2004  . TEMPOROMANDIBULAR JOINT SURGERY     2 surgeries  . TUMOR REMOVAL    . VAGOTOMY  01/23/2012   Procedure: VAGOTOMY, antrectomy and BII;  Surgeon: Haywood Lasso, MD;  Location: [facility];  Service: General;  Laterality: N/A;  Laparotomy with vagotomy.    There were no vitals filed for this visit.      Subjective Assessment - 08/22/17 1536    Subjective  Pt reports that she feels like she is actually doing better with prism glasses than without them now and they don't bother her like they did   Patient is accompained by: Family member   Pertinent History Subarachnoid hemorrhage due to ruptured aneurysm, ataxia (L cerebellar); HTN, R side decr sensation, vertical diplopia/visual deficits, ataxia, Bilateral retinal hemorhage associated with SAH (with surgery on R eye in hospital and L approx 1 month ago)   Limitations visual deficits, fall risk, impulsivity/cognitive deficits   Patient Stated Goals be able to walk and use L arm   Currently in Pain? No/denies         In supine, AAROM shoulder flex and chest press with Ball with BUEs with intermittent min facilitation, min cueing/focus on control.    In prone, scapular retraction with  min-mod cueing for LUE positioning.  Then wt. Bearing on elbows with modified plank for incr scapular/proximal stability.    Wt. Bearing through [place] on mat in sitting with body on arm movements for incr LUE awareness/neuro re-ed.  Checked remaining goals and discussed progress.  While wearing prism glasses, copied small peg design with min cueing for copying/visual perceptual skills and closing L eye x2.  Also with min cueing to slow down as pt put approx 1/2 pegs in with LUE for incr coordination/control  with mod difficulty/drops.                       OT Education - 08/22/17 1633    Education Details Importance of using LUE during dedicated HEP times where she is attending to LUE and visually watching LUE.  Also recommended pt wear oven mitt over R hand when sitting to incr attention to LUE (but only when seated)   Person(s) Educated Spouse;Patient   Methods Explanation   Comprehension Verbalized understanding          OT Short Term Goals - 08/22/17 1559      OT SHORT TERM GOAL #1   Title Pt/caregiver will be independent with initial HEP.--check STGs 08/14/17   Time 4   Period Weeks   Status Achieved  08/19/17:  with cueing from husband     OT [place] #2   Title Pt will be use LUE as nondominant assist at least 50% of the time for ADLs without cueing.   Time 4   Period Weeks   Status On-going  08/19/17:  approx 40% of the time     OT SHORT TERM GOAL #3   Title Pt will improve LUE coordination/control to improve score on box and blocks test by at least 6 with LUE.   Baseline 19 blocks   Time 4   Period Weeks   Status On-going  08/22/17:  18, 20 blocks     OT SHORT TERM GOAL #4   Title Pt will perform simple home maintenance task/snack prep in sitting with set-up.   Time 4   Period Weeks   Status Achieved  08/19/17:  met in clinic with set-up and cueing/prompts for encouragement/to slow down     OT SHORT TERM GOAL #5   Title Pt/husband will verbalize understanding of visual compensation stategies for improved safety/incr ease with ADLs.   Time 4   Period Weeks   Status On-going           OT Long Term Goals - 07/16/17 1907      OT LONG TERM GOAL #1   Title Pt/caregiver will be independent with updated HEP.--check LTGs 09/13/17   Time 8   Period Weeks   Status New     OT LONG TERM GOAL #2   Title Pt will be use LUE as nondominant assist at least 75% of the time for ADLs without cueing.   Time 8   Period Weeks   Status New     OT  LONG TERM GOAL #3   Title Pt will perform toileting mod I (including transfer).   Time 8   Period Weeks   Status New     OT LONG TERM GOAL #4   Title Pt will improve LUE coordination/control to improve score on box and blocks test by at least 12 with LUE.   Baseline 19 blocks   Time 8   Period Weeks   Status New  OT LONG TERM GOAL #5   Title Pt will perform simple home maintenance task/snack prep in standing with set-up/supervision.   Time 8   Period Weeks   Status New     OT LONG TERM GOAL #6   Title Pt will perform simple environmental scanning/navigation with supervision.   Time 8   Period Weeks   Status New               Plan - 08/22/17 1631    Clinical Impression Statement Pt is slowly progressing towards goals with improving LUE functional use and control.  However, impulsivity and decr attention to task and L inattention affect progress.     Rehab Potential Good   Current Impairments/barriers affecting progress: cognitive deficits, impulsivity   OT Frequency 2x / week   OT Duration 8 weeks   OT Treatment/Interventions Self-care/ADL training;DME and/or AE instruction;Patient/family education;Therapeutic exercises;Balance training;Fluidtherapy;Moist Heat;Ultrasound;Therapeutic exercise;Therapeutic activities;Cryotherapy;Neuromuscular education;Functional Mobility Training;Passive range of motion;Cognitive remediation/compensation;Visual/perceptual remediation/compensation;Manual Therapy   Plan continue wih LUE functional use/control, visual scanning, wt. bearing   OT Home Exercise Plan Education provided:  HEP for LUE control, coordination, and functional use   Consulted and Agree with Plan of Care Patient;Family member/caregiver   Family Member Consulted husband      Patient will benefit from skilled therapeutic intervention in order to improve the following deficits and impairments:  Decreased coordination, Decreased range of motion, Difficulty walking,  Abnormal gait, Decreased safety awareness, Impaired sensation, Decreased knowledge of precautions, Decreased balance, Decreased knowledge of use of DME, Impaired UE functional use, Decreased cognition, Decreased mobility, Decreased strength, Impaired vision/preception, Impaired perceived functional ability  Visit Diagnosis: Other lack of coordination  Other abnormalities of gait and mobility  Unsteadiness on feet  Other disturbances of skin sensation  Visuospatial deficit  Attention and concentration deficit  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 Weakness with dizziness-  since [facility] and CVA 04/07/17 08/07/2017  . Disturbances of vision, late effect of stroke 08/06/2017  . Health education/counseling 08/05/2017  . High risk medications (not anticoagulants) long-term use 08/05/2017  . Neuritis-  R sided:  arm and leg/ body due to stroke 08/05/2017  . Elevated LDL cholesterol level 07/11/2017  . [employer] (NIH) Stroke Scale limb ataxia score 2, ataxia present in two limbs 06/25/2017  . 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 Elevated vitamin B12 level 05/30/2017  . Vitamin D deficiency 05/29/2017  . History of tobacco abuse-  30pk yr hx - quit 04/07/17 05/21/2017  . Gait disturbance, post-stroke 05/14/2017  . Benign essential HTN   . Vitreous hemorrhage of right eye ([place])   . Adjustment disorder with mixed anxiety and depressed mood   . 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 Terson syndrome of both eyes ([place]) 04/23/2017  . s/p SAH (subarachnoid hemorrhage) ([place]) 04/19/2017  . 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 Hypoxia   . Subarachnoid  hemorrhage due to ruptured aneurysm ([place]) 04/07/2017  . CVA (cerebral vascular accident) ([place]) 04/07/2017  . Elevated gastrin level 01/25/2012  . Hypokalemia 01/21/2012  . Nausea & vomiting 01/20/2012  . Epigastric pain 01/20/2012  . Duodenal ulcer, acute with obstruction 10/17/2011  . S/P laparoscopic cholecystectomy 10/16/2011    [facility]  08/22/2017, 5:06 PM  [facility] [address] [place], [place], [address] Phone: [phone]   Fax:  [phone]  Name: KYNADI DRAGOS MRN: [id number] Date of Birth: 11-08-1976   Vianne Bulls, OTR/L [facility] [address] [place], [place]  [address] [phone] phone [phone] 08/22/17 5:06 PM

## 2017-08-22 NOTE — Therapy (Signed)
[name] [name], [place], [address] Phone: [phone]   Fax:  [phone]  Physical Therapy Treatment  Patient Details  Name: [name] Barnett MRN: [id number] Date of Birth: 10-Apr-1976 Referring Provider: Charlett Blake, MD  Encounter Date: 08/22/2017      PT End of Session - 08/22/17 1457    Visit Number 8   Number of Visits 17   Date for PT Re-Evaluation 09/13/17   Authorization Type BCBS   PT Start Time 1448   PT Stop Time 1530   PT Time Calculation (min) 42 min   Activity Tolerance Patient tolerated treatment well   Behavior During Therapy [facility] for tasks assessed/performed;Impulsive      Past Medical History:  Diagnosis Date  . Duodenal obstruction   . GERD (gastroesophageal reflux disease)   . Hypertension     Past Surgical History:  Procedure Laterality Date  . ABDOMINAL HYSTERECTOMY    . BALLOON DILATION  12/31/2011   Procedure: BALLOON DILATION;  Surgeon: Missy Sabins, MD;  Location: [facility] ENDOSCOPY;  Service: Endoscopy;  Laterality: N/A;  . CHOLECYSTECTOMY  07/28/11  . ESOPHAGOGASTRODUODENOSCOPY  10/17/2011   Procedure: ESOPHAGOGASTRODUODENOSCOPY (EGD);  Surgeon: Missy Sabins, MD;  Location: [facility] ENDOSCOPY;  Service: Endoscopy;  Laterality: N/A;  . IR ANGIO INTRA EXTRACRAN SEL INTERNAL CAROTID BILAT MOD SED  04/07/2017  . IR ANGIO VERTEBRAL SEL VERTEBRAL UNI R MOD SED  04/07/2017  . IR ANGIOGRAM FOLLOW UP STUDY  04/07/2017  . IR ANGIOGRAM FOLLOW UP STUDY  04/07/2017  . IR ANGIOGRAM FOLLOW UP STUDY  04/07/2017  . IR ANGIOGRAM FOLLOW UP STUDY  04/07/2017  . IR ANGIOGRAM FOLLOW UP STUDY  04/07/2017  . IR ANGIOGRAM SELECTIVE EACH ADDITIONAL VESSEL  04/07/2017  . IR TRANSCATH/EMBOLIZ  04/07/2017  . PARS PLANA VITRECTOMY Right 05/01/2017   Procedure: PARS PLANA VITRECTOMY WITH 25 GAUGE RIGHT EYE, endolaser photocoaglation;  Surgeon: Jalene Mullet, MD;  Location: [place];  Service: Ophthalmology;   Laterality: Right;  . RADIOLOGY WITH ANESTHESIA N/A 04/07/2017   Procedure: RADIOLOGY WITH ANESTHESIA;  Surgeon: Consuella Lose, MD;  Location: [facility] [facility];  Service: Radiology;  Laterality: N/A;  . REPLACEMENT TOTAL KNEE BILATERAL  2004  . TEMPOROMANDIBULAR JOINT SURGERY     2 surgeries  . TUMOR REMOVAL    . VAGOTOMY  01/23/2012   Procedure: VAGOTOMY, antrectomy and BII;  Surgeon: Haywood Lasso, MD;  Location: [place];  Service: General;  Laterality: N/A;  Laparotomy with vagotomy.    There were no vitals filed for this visit.      Subjective Assessment - 08/22/17 1456    Subjective Pt reports not feeling well today, upset stomach.     Patient is accompained by: Family member   Limitations Standing;Walking   Currently in Pain? No/denies  just stomach discomfort                         [facility] Adult PT Treatment/Exercise - 08/22/17 0001      Ambulation/Gait   Ambulation/Gait Yes   Ambulation/Gait Assistance 4: Min assist   Ambulation/Gait Assistance Details Utilized rollator to better assess safety with use at home.  Pt requires heavier min A, however she did do well "pumping" brakes to slow herself down and re-set if walker moving too fast.  Feel that this is not as safe as RW for gait at this time.     Ambulation Distance (Feet) 115 Feet  Assistive device 4-wheeled walker   Gait Pattern Step-through pattern;Decreased stride length;Ataxic;Narrow base of support   Ambulation Surface Level;Indoor     Therapeutic Activites    Therapeutic Activities ADL's   ADL's Pt/spouse verbalizing that they are unable to practice many things at home due to husband's long work ours and pts increased need for assist at this time.  Pt very frustrated by being "stuck" on the couch at home most of the day.  PT re-visited use of rollator with hope that pt would be safe enough to ambulate with around the house with assist, but able to sit and "walk" herself around home to get to restroom,  kitchen, etc.  Did ambulate x 115' with rollator, see above for details.  Also had pt lock rollator and sit, unlock and "walk" with BLEs.  She did this very well, therefore had her set up rollator as it would be in restroom to transfer to toilet.  Note that she continues to be impulsive with movement and tends to lose balance to the R.  Provided education and demonstration of performing by keeping UE support at all times.  Utilized therband as pants to have her return demo of using single UE to manage clothing as well as bracing against toilet for support.  She was able to do this on her own, however PT still fears that she will lose balance during transfer, as she relies so heavily on UE support and rollator would tend to move somewhat during transfer.  Discussed getting transport chair, however pt would not be able to lock/unlock this on her own, therefore recommended they check second hand stores ([employer], [employer], etc) for used w/c that she can use around the house as this would be more stable when locked vs rollator.  Both pt and husband verbalized understanding.                  PT Education - 08/22/17 1955    Education provided Yes   Education Details See TA section regarding use of rollator vs w/c to get around house   Person(s) Educated Patient;Spouse   Methods Explanation   Comprehension Verbalized understanding          PT Short Term Goals - 08/16/17 1411      PT SHORT TERM GOAL #1   Title Pt will participate in vestibular assessment and assessment of gait velocity with LTG to be set    Time 4   Period Weeks   Status Achieved     PT SHORT TERM GOAL #2   Title Pt will improve balance and midline orientation to perform sit <> stand with supervision and be able to maintain static standing x 2 minutes without UE support and no LOB to R   Baseline met 08/16/17   Time 4   Period Weeks   Status Achieved     PT SHORT TERM GOAL #3   Title Pt will decrease falls risk as  indicated by increase in BERG balance score to > or = 22/56   Baseline 17/56 to 22/56 on 08/16/17   Time 4   Period Weeks   Status Achieved     PT SHORT TERM GOAL #4   Title Pt will ambulate x 150' on indoor surfaces with LRAD and min A and require <10% assistance to maintain AD in straight path and <10% verbal cues for gait pattern.   Baseline partially met, felt that she needed mod cues for safe gait pattern.  Time 4   Period Weeks   Status Partially Met           PT Long Term Goals - 08/01/17 1447      PT LONG TERM GOAL #1   Title Pt and husband will demonstrate independence with HEP   Time 8   Period Weeks   Status New   Target Date 09/13/17     PT LONG TERM GOAL #2   Title Pt will demonstrate improved balance and decreased falls risk as indicated by BERG balance score of > or = 27/56   Time 8   Period Weeks   Status New   Target Date 09/13/17     PT LONG TERM GOAL #3   Title Pt will ambulate with LRAD x 300' over level indoor and paved outdoor surfaces with supervision and intermittent cues for gait sequence   Time 8   Period Weeks   Status New   Target Date 09/13/17     PT LONG TERM GOAL #4   Title Pt will report 15% improvement in Neuro QOL-LE   Baseline 31.3%   Time 8   Period Weeks   Status New   Target Date 09/13/17     PT LONG TERM GOAL #5   Title Pt will decrease falls risk during gait in home/community as indicated by increase in gait velocity to > or = 1.28f/sec with LRAD   Baseline .823ft/sec with RW and up to mod A on 07/29/17   Time 8   Period Weeks   Status New   Target Date 09/13/17     PT LONG TERM GOAL #6   Title Pt will report <2/10 dizziness with head movements and will demonstrate improved gaze stability as indicated by ability to perform x 1 viewing HEP x 60 seconds in each direction   Time 8   Period Weeks   Status Revised   Target Date 09/13/17               Plan - 08/22/17 1955    Clinical Impression Statement  Skilled session focused on practicing using rollator to sit and "walk" around home as well as perform transfers to/from.  Feel that she would be safer at this time with use of w/c to get around house and suggest they search for used w/c at second hand stores.  Pt and spouse verbalized understanding.     Rehab Potential Good   PT Frequency 2x / week   PT Duration 8 weeks   PT Treatment/Interventions ADLs/Self Care Home Management;Electrical Stimulation;DME Instruction;Gait training;Stair training;Functional mobility training;Therapeutic activities;Therapeutic exercise;Balance training;Neuromuscular re-education;Cognitive remediation;Patient/family education;Orthotic Fit/Training;Vestibular;Visual/perceptual remediation/compensation   PT Next Visit Plan ASSESS VITALS EACH VISIT; review vestibular HEP and progress if able, gait without device as able to improve technique. , NMR and balance training: weight shifting to L, WB for proprioceptive feedback, coordination, turns   Consulted and Agree with Plan of Care Patient;Family member/caregiver   Family Member Consulted husband      Patient will benefit from skilled therapeutic intervention in order to improve the following deficits and impairments:  Abnormal gait, Decreased balance, Decreased cognition, Decreased coordination, Decreased strength, Difficulty walking, Dizziness, Impaired sensation, Impaired vision/preception, Pain  Visit Diagnosis: Other lack of coordination  Other abnormalities of gait and mobility  Unsteadiness on feet  Other disturbances of skin sensation  Ataxic gait     Problem List Patient Active Problem List   Diagnosis Date Noted  . [facility]  [facility]  [facility] 04/07/17 08/07/2017  . Disturbances of vision, late effect of stroke 08/06/2017  . Health education/counseling 08/05/2017  . High risk medications (not anticoagulants) long-term use 08/05/2017  . Neuritis-  R sided:  arm and leg/ body due to  stroke 08/05/2017  . Elevated LDL cholesterol level 07/11/2017  . [employer] (NIH) Stroke Scale limb ataxia score 2, ataxia present in two limbs 06/25/2017  . 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 Elevated vitamin B12 level 05/30/2017  . Vitamin D deficiency 05/29/2017  . History of tobacco abuse-  30pk yr hx - quit 04/07/17 05/21/2017  . Gait disturbance, post-stroke 05/14/2017  . Benign essential HTN   . Vitreous hemorrhage of right eye ([place])   . Adjustment disorder with mixed anxiety and depressed mood   . 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 s/p SAH (subarachnoid hemorrhage) ([place]) 04/19/2017  . 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 Hypoxia   . Subarachnoid hemorrhage due to ruptured aneurysm ([place]) 04/07/2017  . CVA (cerebral vascular accident) ([place]) 04/07/2017  . Elevated gastrin level 01/25/2012  . Hypokalemia 01/21/2012  . Nausea & vomiting 01/20/2012  . Epigastric pain 01/20/2012  . Duodenal ulcer, acute with obstruction 10/17/2011  . S/P laparoscopic cholecystectomy 10/16/2011    Cameron Sprang, PT, MPT [facility] [address] [place], [place], [address] Phone: [phone]   Fax:  [phone] 08/22/17, 7:59 PM  Name: DALLYS NOWAKOWSKI MRN: [id number] Date of Birth: 25-Apr-1976

## 2017-08-24 ENCOUNTER — Other Ambulatory Visit: Payer: Self-pay | Admitting: Family Medicine

## 2017-08-24 DIAGNOSIS — I10 Essential (primary) hypertension: Secondary | ICD-10-CM

## 2017-08-24 NOTE — Telephone Encounter (Signed)
Please call the patient and see exactly how how many milligrams she is taking per day.  Okay to refill 90 day supply with no refill until seen in follow-up

## 2017-08-25 ENCOUNTER — Other Ambulatory Visit: Payer: Self-pay | Admitting: Family Medicine

## 2017-08-25 DIAGNOSIS — I10 Essential (primary) hypertension: Secondary | ICD-10-CM

## 2017-08-26 ENCOUNTER — Ambulatory Visit: Payer: BLUE CROSS/BLUE SHIELD | Admitting: Physical Therapy

## 2017-08-26 ENCOUNTER — Encounter: Payer: Self-pay | Admitting: Physical Therapy

## 2017-08-26 ENCOUNTER — Ambulatory Visit: Payer: BLUE CROSS/BLUE SHIELD | Admitting: Occupational Therapy

## 2017-08-26 ENCOUNTER — Telehealth: Payer: Self-pay | Admitting: *Deleted

## 2017-08-26 VITALS — BP 140/85

## 2017-08-26 DIAGNOSIS — R278 Other lack of coordination: Secondary | ICD-10-CM

## 2017-08-26 DIAGNOSIS — R2689 Other abnormalities of gait and mobility: Secondary | ICD-10-CM | POA: Diagnosis not present

## 2017-08-26 DIAGNOSIS — R42 Dizziness and giddiness: Secondary | ICD-10-CM

## 2017-08-26 DIAGNOSIS — R2681 Unsteadiness on feet: Secondary | ICD-10-CM

## 2017-08-26 DIAGNOSIS — R260 Ataxic gait: Secondary | ICD-10-CM

## 2017-08-26 NOTE — Therapy (Signed)
[facility] [address] [place], [place], [address] Phone: [phone]   Fax:  [phone]  Physical Therapy Treatment  Patient Details  Name: [name] Barnett MRN: [id number] Date of Birth: 1976/02/25 Referring Provider: Charlett Blake, MD  Encounter Date: 08/26/2017      PT End of Session - 08/26/17 1633    Visit Number 9   Number of Visits 17   Date for PT Re-Evaluation 09/13/17   Authorization Type BCBS   PT Start Time 9450   PT Stop Time 1620   PT Time Calculation (min) 50 min   Activity Tolerance Patient tolerated treatment well   Behavior During Therapy [facility] for tasks assessed/performed;Impulsive      Past Medical History:  Diagnosis Date  . Duodenal obstruction   . GERD (gastroesophageal reflux disease)   . Hypertension     Past Surgical History:  Procedure Laterality Date  . ABDOMINAL HYSTERECTOMY    . BALLOON DILATION  12/31/2011   Procedure: BALLOON DILATION;  Surgeon: Missy Sabins, MD;  Location: [facility] ENDOSCOPY;  Service: Endoscopy;  Laterality: N/A;  . CHOLECYSTECTOMY  07/28/11  . ESOPHAGOGASTRODUODENOSCOPY  10/17/2011   Procedure: ESOPHAGOGASTRODUODENOSCOPY (EGD);  Surgeon: Missy Sabins, MD;  Location: [facility] ENDOSCOPY;  Service: Endoscopy;  Laterality: N/A;  . IR ANGIO INTRA EXTRACRAN SEL INTERNAL CAROTID BILAT MOD SED  04/07/2017  . IR ANGIO VERTEBRAL SEL VERTEBRAL UNI R MOD SED  04/07/2017  . IR ANGIOGRAM FOLLOW UP STUDY  04/07/2017  . IR ANGIOGRAM FOLLOW UP STUDY  04/07/2017  . IR ANGIOGRAM FOLLOW UP STUDY  04/07/2017  . IR ANGIOGRAM FOLLOW UP STUDY  04/07/2017  . IR ANGIOGRAM FOLLOW UP STUDY  04/07/2017  . IR ANGIOGRAM SELECTIVE EACH ADDITIONAL VESSEL  04/07/2017  . IR TRANSCATH/EMBOLIZ  04/07/2017  . PARS PLANA VITRECTOMY Right 05/01/2017   Procedure: PARS PLANA VITRECTOMY WITH 25 GAUGE RIGHT EYE, endolaser photocoaglation;  Surgeon: Jalene Mullet, MD;  Location: [facility];  Service: Ophthalmology;   Laterality: Right;  . RADIOLOGY WITH ANESTHESIA N/A 04/07/2017   Procedure: RADIOLOGY WITH ANESTHESIA;  Surgeon: Consuella Lose, MD;  Location: [facility];  Service: Radiology;  Laterality: N/A;  . REPLACEMENT TOTAL KNEE BILATERAL  2004  . TEMPOROMANDIBULAR JOINT SURGERY     2 surgeries  . TUMOR REMOVAL    . VAGOTOMY  01/23/2012   Procedure: VAGOTOMY, antrectomy and BII;  Surgeon: Haywood Lasso, MD;  Location: [place];  Service: General;  Laterality: N/A;  Laparotomy with vagotomy.    Vitals:   08/26/17 1537  BP: 140/85        Subjective Assessment - 08/26/17 1535    Subjective Pt reports that her upset stomach is due to not having a gallbladder; passed quickly.  Contacted Dr. Letta Pate to have him increase her medication for LE pain-has not heard back yet.   Patient is accompained by: Family member   Limitations Standing;Walking   Currently in Pain? No/denies                         [facility] Adult PT Treatment/Exercise - 08/26/17 1540      Therapeutic Activites    Therapeutic Activities ADL's   ADL's Revisited recommendation to purchase transport w/c for pt to use to have increased independent mobility around the house and allow her to assist with more household duties/chores.  Husband does not feel it will be safe for pt to use transport w/c in the  home and it may lead to pt attempting to stand or do other things that would be unsafe.  Husband is trying to leave tasks for pt to do in the living room and have pt help some with meals at night.  Will continue to problem solve and discuss.     Neuro Re-ed    Neuro Re-ed Details  Continued NMR for weight shift and gait training: performed x 115' initially with pt holding walking poles and therapist holding poles behind pt to sequence reciprocal arm swing but pt demonstrating increased difficulty with weight shifting and keeps LLE ABD causing lateropulsion to R.  Transitioned to gait x 115' x 2 with no UE support on R but LUE  reaching up and to the L to facilitate increased L lateral trunk elongation and L lateral weight shifting with pt demonstrating improved and more symmetrical gait sequence with improved step and stride length and increased weight shift and stance time on LLE-pt reporting it felt the most comfortable and normal she has walked since the CVA.     Exercises   Exercises Knee/Hip     Knee/Hip Exercises: Aerobic   Tread Mill 1.0 to 1.1 mph with use of gait trainer for 2-3 minutes at a time with focus on increasing stance time, step and stride length bilaterally. Pt required manual facilitation at pelvis for pelvic and trunk control, lateral weight shifting and AAROM for full step length LLE and sequencing stepping.  With practice pt was able to use screen and visual feedback to self monitor and self correct step length and stance time but fatigued very quickly.                PT Education - 08/26/17 1632    Education provided Yes   Education Details normal gait sequence   Person(s) Educated Patient;Spouse   Methods Explanation;Demonstration   Comprehension Verbalized understanding          PT Short Term Goals - 08/16/17 1411      PT SHORT TERM GOAL #1   Title Pt will participate in vestibular assessment and assessment of gait velocity with LTG to be set    Time 4   Period Weeks   Status Achieved     PT SHORT TERM GOAL #2   Title Pt will improve balance and midline orientation to perform sit <> stand with supervision and be able to maintain static standing x 2 minutes without UE support and no LOB to R   Baseline met 08/16/17   Time 4   Period Weeks   Status Achieved     PT SHORT TERM GOAL #3   Title Pt will decrease falls risk as indicated by increase in BERG balance score to > or = 22/56   Baseline 17/56 to 22/56 on 08/16/17   Time 4   Period Weeks   Status Achieved     PT SHORT TERM GOAL #4   Title Pt will ambulate x 150' on indoor surfaces with LRAD and min A and require  <10% assistance to maintain AD in straight path and <10% verbal cues for gait pattern.   Baseline partially met, felt that she needed mod cues for safe gait pattern.    Time 4   Period Weeks   Status Partially Met           PT Long Term Goals - 08/01/17 1447      PT LONG TERM GOAL #1   Title Pt and husband will demonstrate  independence with HEP   Time 8   Period Weeks   Status New   Target Date 09/13/17     PT LONG TERM GOAL #2   Title Pt will demonstrate improved balance and decreased falls risk as indicated by BERG balance score of > or = 27/56   Time 8   Period Weeks   Status New   Target Date 09/13/17     PT LONG TERM GOAL #3   Title Pt will ambulate with LRAD x 300' over level indoor and paved outdoor surfaces with supervision and intermittent cues for gait sequence   Time 8   Period Weeks   Status New   Target Date 09/13/17     PT LONG TERM GOAL #4   Title Pt will report 15% improvement in Neuro QOL-LE   Baseline 31.3%   Time 8   Period Weeks   Status New   Target Date 09/13/17     PT LONG TERM GOAL #5   Title Pt will decrease falls risk during gait in home/community as indicated by increase in gait velocity to > or = 1.72f/sec with LRAD   Baseline .824ft/sec with RW and up to mod A on 07/29/17   Time 8   Period Weeks   Status New   Target Date 09/13/17     PT LONG TERM GOAL #6   Title Pt will report <2/10 dizziness with head movements and will demonstrate improved gaze stability as indicated by ability to perform x 1 viewing HEP x 60 seconds in each direction   Time 8   Period Weeks   Status Revised   Target Date 09/13/17               Plan - 08/26/17 1635    Clinical Impression Statement Treatment session focused on NMR for gait training with use of treadmill, walking sticks and then with LUE raised to facilitate greater weight shift to L which resulted in most symmetrical gait patter.  Pt had increased difficulty with sustained attention to  gait sequence and limited carry over of sequence when holding RW; will continue to address and progress towards LTG.   Rehab Potential Good   PT Frequency 2x / week   PT Duration 8 weeks   PT Treatment/Interventions ADLs/Self Care Home Management;Electrical Stimulation;DME Instruction;Gait training;Stair training;Functional mobility training;Therapeutic activities;Therapeutic exercise;Balance training;Neuromuscular re-education;Cognitive remediation;Patient/family education;Orthotic Fit/Training;Vestibular;Visual/perceptual remediation/compensation   PT Next Visit Plan ASSESS VITALS EACH VISIT; review vestibular HEP and progress if able, gait without device as able to improve technique. , NMR and balance training: weight shifting to L-ambulate with LUE raised, WB for proprioceptive feedback, coordination, turns   Consulted and Agree with Plan of Care Patient;Family member/caregiver   Family Member Consulted husband      Patient will benefit from skilled therapeutic intervention in order to improve the following deficits and impairments:  Abnormal gait, Decreased balance, Decreased cognition, Decreased coordination, Decreased strength, Difficulty walking, Dizziness, Impaired sensation, Impaired vision/preception, Pain  Visit Diagnosis: Ataxic gait  Other lack of coordination  Other abnormalities of gait and mobility  Unsteadiness on feet  Dizziness and giddiness     Problem List Patient Active Problem List   Diagnosis Date Noted  . [facility]  [facility] 08/[facility]2017  . Disturbances of vision, late effect of stroke 08/06/2017  . Health education/counseling 08/05/2017  . High risk medications (not anticoagulants) long-term use 08/05/2017  . Neuritis-  R sided:  arm and leg/ body  due to stroke 08/05/2017  . Elevated LDL cholesterol level 07/11/2017  . [employer] (NIH) Stroke Scale limb ataxia score 2, ataxia present in two limbs  06/25/2017  . 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 Elevated vitamin B12 level 05/30/2017  . Vitamin D deficiency 05/29/2017  . History of tobacco abuse-  30pk yr hx - quit 04/07/17 05/21/2017  . Gait disturbance, post-stroke 05/14/2017  . Benign essential HTN   . Vitreous hemorrhage of right eye ([place])   . Adjustment disorder with mixed anxiety and depressed mood   . 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 s/p SAH (subarachnoid hemorrhage) ([place]) 04/19/2017  . 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 Hypoxia   . Subarachnoid hemorrhage due to ruptured aneurysm ([place]) 04/07/2017  . CVA (cerebral vascular accident) ([place]) 04/07/2017  . Elevated gastrin level 01/25/2012  . Hypokalemia 01/21/2012  . Nausea & vomiting 01/20/2012  . Epigastric pain 01/20/2012  . Duodenal ulcer, acute with obstruction 10/17/2011  . S/P laparoscopic cholecystectomy 10/16/2011    Raylene Everts, PT, DPT 08/26/17    4:39 PM    [facility] [address] [place], [place], [address] Phone: [phone]   Fax:  [phone]  Name: [name] Barnett MRN: [id number] Date of Birth: [birth year]

## 2017-08-26 NOTE — Telephone Encounter (Addendum)
[name] Barnett called and requested increase on her leg nerve pain medication.  Last noted says she may increase up to tid if tolerated but sig on Rx still says q hs. [name] Barnett reports she is taking 2 tid (600 mg day) and it is not helping.

## 2017-08-26 NOTE — Telephone Encounter (Signed)
May increase to gabapentin 600 mg 4 times a day

## 2017-08-27 MED ORDER — GABAPENTIN 600 MG PO TABS: 600.0000 mg | ORAL_TABLET | Freq: Four times a day (QID) | ORAL | 0 refills | Status: DC

## 2017-08-27 NOTE — Telephone Encounter (Signed)
Medication ordered electronically, patient notified

## 2017-08-29 ENCOUNTER — Encounter: Payer: Self-pay | Admitting: Physical Therapy

## 2017-08-29 ENCOUNTER — Ambulatory Visit: Payer: BLUE CROSS/BLUE SHIELD | Admitting: Physical Therapy

## 2017-08-29 ENCOUNTER — Ambulatory Visit: Payer: BLUE CROSS/BLUE SHIELD | Admitting: Occupational Therapy

## 2017-08-29 DIAGNOSIS — R2689 Other abnormalities of gait and mobility: Secondary | ICD-10-CM

## 2017-08-29 DIAGNOSIS — R2681 Unsteadiness on feet: Secondary | ICD-10-CM

## 2017-08-29 DIAGNOSIS — R41840 Attention and concentration deficit: Secondary | ICD-10-CM

## 2017-08-29 DIAGNOSIS — R414 Neurologic neglect syndrome: Secondary | ICD-10-CM

## 2017-08-29 DIAGNOSIS — R278 Other lack of coordination: Secondary | ICD-10-CM

## 2017-08-29 DIAGNOSIS — R270 Ataxia, unspecified: Secondary | ICD-10-CM

## 2017-08-29 DIAGNOSIS — R260 Ataxic gait: Secondary | ICD-10-CM

## 2017-08-29 DIAGNOSIS — R208 Other disturbances of skin sensation: Secondary | ICD-10-CM

## 2017-08-29 DIAGNOSIS — R41842 Visuospatial deficit: Secondary | ICD-10-CM

## 2017-08-29 DIAGNOSIS — R42 Dizziness and giddiness: Secondary | ICD-10-CM

## 2017-08-29 DIAGNOSIS — I69018 Other symptoms and signs involving cognitive functions following nontraumatic subarachnoid hemorrhage: Secondary | ICD-10-CM

## 2017-08-29 DIAGNOSIS — I69054 Hemiplegia and hemiparesis following nontraumatic subarachnoid hemorrhage affecting left non-dominant side: Secondary | ICD-10-CM

## 2017-08-29 NOTE — Patient Instructions (Signed)
Diplopia HEP:  Perform at least 3 times per day. Stop if your eye becomes fatigued or hurts and try again later.  1. Hold a small object/card in front of you.  Hold it in the middle at arm's length away.    2. Cover your LEFT eye and look at the object with your RIGHT eye.  3. Slowly move the object side to side in front of you while continuing to watch it with your RIGHT eye.  4.  Remember to keep your head still and only move your eye.  5.  Repeat 3 times.  6.  Then, move object up and down while watching it 3 times.  7. Cover your RIGHT eye and look at the object with your LEFT eye while you repeat #1-6 above.  8.  Now, uncover both eyes and try to focus on the object while holding it in the middle.  Try to make it 1 image.   9.  If you can, try to hold it for 10-30 sec increasing as able.    10.  Once you can make the image 1 for at least 30 sec in the middle, repeat #1-6 above with both eyes moving slowly and only in the range that you can keep the image 1.

## 2017-08-29 NOTE — Therapy (Signed)
[facility] [address] [place], [place], [address] Phone: [phone]   Fax:  [phone]  Occupational Therapy Treatment  Patient Details  Name: [name] Barnett MRN: [id number] Date of Birth: February 12, 1976 Referring Provider: Dr. Alysia Penna  Encounter Date: 08/29/2017      OT End of Session - 08/29/17 1523    Visit Number 9   Number of Visits 17   Date for OT Re-Evaluation 09/13/17   Authorization Type BCBS, no visit limit/auth req.   OT Start Time 1450   OT Stop Time 1530   OT Time Calculation (min) 40 min   Activity Tolerance Patient tolerated treatment well   Behavior During Therapy WFL for tasks assessed/performed      Past Medical History:  Diagnosis Date  . Duodenal obstruction   . GERD (gastroesophageal reflux disease)   . Hypertension     Past Surgical History:  Procedure Laterality Date  . ABDOMINAL HYSTERECTOMY    . BALLOON DILATION  12/31/2011   Procedure: BALLOON DILATION;  Surgeon: Missy Sabins, MD;  Location: [facility] ENDOSCOPY;  Service: Endoscopy;  Laterality: N/A;  . CHOLECYSTECTOMY  07/28/11  . ESOPHAGOGASTRODUODENOSCOPY  10/17/2011   Procedure: ESOPHAGOGASTRODUODENOSCOPY (EGD);  Surgeon: Missy Sabins, MD;  Location: [facility] ENDOSCOPY;  Service: Endoscopy;  Laterality: N/A;  . IR ANGIO INTRA EXTRACRAN SEL INTERNAL CAROTID BILAT MOD SED  04/07/2017  . IR ANGIO VERTEBRAL SEL VERTEBRAL UNI R MOD SED  04/07/2017  . IR ANGIOGRAM FOLLOW UP STUDY  04/07/2017  . IR ANGIOGRAM FOLLOW UP STUDY  04/07/2017  . IR ANGIOGRAM FOLLOW UP STUDY  04/07/2017  . IR ANGIOGRAM FOLLOW UP STUDY  04/07/2017  . IR ANGIOGRAM FOLLOW UP STUDY  04/07/2017  . IR ANGIOGRAM SELECTIVE EACH ADDITIONAL VESSEL  04/07/2017  . IR TRANSCATH/EMBOLIZ  04/07/2017  . PARS PLANA VITRECTOMY Right 05/01/2017   Procedure: PARS PLANA VITRECTOMY WITH 25 GAUGE RIGHT EYE, endolaser photocoaglation;  Surgeon: Jalene Mullet, MD;  Location: [facility];  Service:  Ophthalmology;  Laterality: Right;  . RADIOLOGY WITH ANESTHESIA N/A 04/07/2017   Procedure: RADIOLOGY WITH ANESTHESIA;  Surgeon: Consuella Lose, MD;  Location: [facility];  Service: Radiology;  Laterality: N/A;  . REPLACEMENT TOTAL KNEE BILATERAL  2004  . TEMPOROMANDIBULAR JOINT SURGERY     2 surgeries  . TUMOR REMOVAL    . VAGOTOMY  01/23/2012   Procedure: VAGOTOMY, antrectomy and BII;  Surgeon: Haywood Lasso, MD;  Location: [facility];  Service: General;  Laterality: N/A;  Laparotomy with vagotomy.    There were no vitals filed for this visit.      Subjective Assessment - 08/29/17 1456    Subjective  I made 2 peanutbutter and bananna sandwiches   Patient is accompained by: Family member   Pertinent History Subarachnoid hemorrhage due to ruptured aneurysm, ataxia (L cerebellar); HTN, R side decr sensation, vertical diplopia/visual deficits, ataxia, Bilateral retinal hemorhage associated with SAH (with surgery on R eye in hospital and L approx 1 month ago)   Limitations visual deficits, fall risk, impulsivity/cognitive deficits   Patient Stated Goals be able to walk and use L arm   Currently in Pain? No/denies       In supine, AAROM shoulder flex and chest press with Ball with BUEs with intermittent min facilitation, min cueing/focus on control.    Ambulating within clinic with min A and mod cueing for safety/impulsivity.    In sitting, functional reaching to mid-level to grasp/release cylinder objects with min-mod  cueing for impulsivity/slow, controlled movements and mod difficulty, but improved with repetition.  Then repeated in standing encouraging wt. Shift to L for lateral reaching and then forward reaching with CGA and min-mod cueing, min-mod difficulty/drops.  Cueing throughout session for attention, impulsivity, and slower movements.                        OT Education - 08/29/17 1722    Education Details Visual HEP--see pt instructions   Person(s) Educated  Patient;Spouse   Methods Explanation;Demonstration;Verbal cues;Handout   Comprehension Returned demonstration;Verbalized understanding;Verbal cues required          OT Short Term Goals - 08/22/17 1559      OT SHORT TERM GOAL #1   Title Pt/caregiver will be independent with initial HEP.--check STGs 08/14/17   Time 4   Period Weeks   Status Achieved  08/19/17:  with cueing from husband     OT [place] #2   Title Pt will be use LUE as nondominant assist at least 50% of the time for ADLs without cueing.   Time 4   Period Weeks   Status On-going  08/19/17:  approx 40% of the time     OT SHORT TERM GOAL #3   Title Pt will improve LUE coordination/control to improve score on box and blocks test by at least 6 with LUE.   Baseline 19 blocks   Time 4   Period Weeks   Status On-going  08/22/17:  18, 20 blocks     OT SHORT TERM GOAL #4   Title Pt will perform simple home maintenance task/snack prep in sitting with set-up.   Time 4   Period Weeks   Status Achieved  08/19/17:  met in clinic with set-up and cueing/prompts for encouragement/to slow down     OT SHORT TERM GOAL #5   Title Pt/husband will verbalize understanding of visual compensation stategies for improved safety/incr ease with ADLs.   Time 4   Period Weeks   Status On-going           OT Long Term Goals - 07/16/17 1907      OT LONG TERM GOAL #1   Title Pt/caregiver will be independent with updated HEP.--check LTGs 09/13/17   Time 8   Period Weeks   Status New     OT LONG TERM GOAL #2   Title Pt will be use LUE as nondominant assist at least 75% of the time for ADLs without cueing.   Time 8   Period Weeks   Status New     OT LONG TERM GOAL #3   Title Pt will perform toileting mod I (including transfer).   Time 8   Period Weeks   Status New     OT LONG TERM GOAL #4   Title Pt will improve LUE coordination/control to improve score on box and blocks test by at least 12 with LUE.   Baseline 19 blocks    Time 8   Period Weeks   Status New     OT LONG TERM GOAL #5   Title Pt will perform simple home maintenance task/snack prep in standing with set-up/supervision.   Time 8   Period Weeks   Status New     OT LONG TERM GOAL #6   Title Pt will perform simple environmental scanning/navigation with supervision.   Time 8   Period Weeks   Status New  Plan - 08/29/17 1720    Clinical Impression Statement Pt is progressing slowly towards goals with improving LUE functional use/control.  However, pt continues to need cueing for safety, impulsivity, and attention.   Rehab Potential Good   Current Impairments/barriers affecting progress: cognitive deficits, impulsivity   OT Frequency 2x / week   OT Duration 8 weeks   OT Treatment/Interventions Self-care/ADL training;DME and/or AE instruction;Patient/family education;Therapeutic exercises;Balance training;Fluidtherapy;Moist Heat;Ultrasound;Therapeutic exercise;Therapeutic activities;Cryotherapy;Neuromuscular education;Functional Mobility Training;Passive range of motion;Cognitive remediation/compensation;Visual/perceptual remediation/compensation;Manual Therapy   Plan continue with LUE functional use/control, visual scanning, wt. bearing   OT Home Exercise Plan Education provided:  HEP for LUE control, coordination, and functional use   Consulted and Agree with Plan of Care Patient;Family member/caregiver   Family Member Consulted husband      Patient will benefit from skilled therapeutic intervention in order to improve the following deficits and impairments:  Decreased coordination, Decreased range of motion, Difficulty walking, Abnormal gait, Decreased safety awareness, Impaired sensation, Decreased knowledge of precautions, Decreased balance, Decreased knowledge of use of DME, Impaired UE functional use, Decreased cognition, Decreased mobility, Decreased strength, Impaired vision/preception, Impaired perceived functional  ability  Visit Diagnosis: Other lack of coordination  Other abnormalities of gait and mobility  Unsteadiness on feet  Visuospatial deficit  Other disturbances of skin sensation  Attention and concentration deficit  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 Weakness with dizziness-  since [facility] and CVA 04/07/17 08/07/2017  . Disturbances of vision, late effect of stroke 08/06/2017  . Health education/counseling 08/05/2017  . High risk medications (not anticoagulants) long-term use 08/05/2017  . Neuritis-  R sided:  arm and leg/ body due to stroke 08/05/2017  . Elevated LDL cholesterol level 07/11/2017  . [employer] (NIH) Stroke Scale limb ataxia score 2, ataxia present in two limbs 06/25/2017  . 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 Elevated vitamin B12 level 05/30/2017  . Vitamin D deficiency 05/29/2017  . History of tobacco abuse-  30pk yr hx - quit 04/07/17 05/21/2017  . Gait disturbance, post-stroke 05/14/2017  . Benign essential HTN   . Vitreous hemorrhage of right eye ([place])   . Adjustment disorder with mixed anxiety and depressed mood   . 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 s/p SAH (subarachnoid hemorrhage) ([place]) 04/19/2017  . 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 Hypoxia   . Subarachnoid hemorrhage due to ruptured aneurysm ([place]) 04/07/2017  . CVA (cerebral vascular accident) ([place]) 04/07/2017  . Elevated gastrin level 01/25/2012  . Hypokalemia 01/21/2012  . Nausea & vomiting 01/20/2012  . Epigastric pain 01/20/2012  . Duodenal ulcer, acute with obstruction 10/17/2011  . S/P  laparoscopic cholecystectomy 10/16/2011    [facility] 08/29/2017, 5:23 PM  [facility] [address] [place], [place], [address] Phone: [phone]   Fax:  [phone]  Name: CASSIDIE VEIGA MRN: [id number] Date of Birth: [birth year]   Vianne Bulls, OTR/L [facility] [address] [place], [place]  [address] [phone] phone [phone] 08/29/17 5:23 PM

## 2017-08-29 NOTE — Therapy (Signed)
[facility] [address] [place], [place], [address] Phone: [phone]   Fax:  [phone]  Physical Therapy Treatment  Patient Details  Name: [name] Barnett MRN: [id number] Date of Birth: 04/15/1976 Referring Provider: Charlett Blake, MD  Encounter Date: 08/29/2017      PT End of Session - 08/29/17 1623    Visit Number 10   Number of Visits 17   Date for PT Re-Evaluation 09/13/17   Authorization Type BCBS   PT Start Time 1532   PT Stop Time 1617   PT Time Calculation (min) 45 min   Activity Tolerance Patient tolerated treatment well   Behavior During Therapy [facility] for tasks assessed/performed;Impulsive      Past Medical History:  Diagnosis Date  . Duodenal obstruction   . GERD (gastroesophageal reflux disease)   . Hypertension     Past Surgical History:  Procedure Laterality Date  . ABDOMINAL HYSTERECTOMY    . BALLOON DILATION  12/31/2011   Procedure: BALLOON DILATION;  Surgeon: Missy Sabins, MD;  Location: [facility] ENDOSCOPY;  Service: Endoscopy;  Laterality: N/A;  . CHOLECYSTECTOMY  07/28/11  . ESOPHAGOGASTRODUODENOSCOPY  10/17/2011   Procedure: ESOPHAGOGASTRODUODENOSCOPY (EGD);  Surgeon: Missy Sabins, MD;  Location: [facility] ENDOSCOPY;  Service: Endoscopy;  Laterality: N/A;  . IR ANGIO INTRA EXTRACRAN SEL INTERNAL CAROTID BILAT MOD SED  04/07/2017  . IR ANGIO VERTEBRAL SEL VERTEBRAL UNI R MOD SED  04/07/2017  . IR ANGIOGRAM FOLLOW UP STUDY  04/07/2017  . IR ANGIOGRAM FOLLOW UP STUDY  04/07/2017  . IR ANGIOGRAM FOLLOW UP STUDY  04/07/2017  . IR ANGIOGRAM FOLLOW UP STUDY  04/07/2017  . IR ANGIOGRAM FOLLOW UP STUDY  04/07/2017  . IR ANGIOGRAM SELECTIVE EACH ADDITIONAL VESSEL  04/07/2017  . IR TRANSCATH/EMBOLIZ  04/07/2017  . PARS PLANA VITRECTOMY Right 05/01/2017   Procedure: PARS PLANA VITRECTOMY WITH 25 GAUGE RIGHT EYE, endolaser photocoaglation;  Surgeon: Jalene Mullet, MD;  Location: [place];  Service: Ophthalmology;   Laterality: Right;  . RADIOLOGY WITH ANESTHESIA N/A 04/07/2017   Procedure: RADIOLOGY WITH ANESTHESIA;  Surgeon: Consuella Lose, MD;  Location: [place];  Service: Radiology;  Laterality: N/A;  . REPLACEMENT TOTAL KNEE BILATERAL  2004  . TEMPOROMANDIBULAR JOINT SURGERY     2 surgeries  . TUMOR REMOVAL    . VAGOTOMY  01/23/2012   Procedure: VAGOTOMY, antrectomy and BII;  Surgeon: Haywood Lasso, MD;  Location: [place];  Service: General;  Laterality: N/A;  Laparotomy with vagotomy.    There were no vitals filed for this visit.      Subjective Assessment - 08/29/17 1535    Subjective No falls at home; pt states she has going to the store on her mind.  Discussed specifics of going to grocery store.   Patient is accompained by: Family member   Limitations Standing;Walking   Currently in Pain? No/denies                         [facility] Adult PT Treatment/Exercise - 08/29/17 1540      Neuro Re-ed    Neuro Re-ed Details  Quadruped for trunk/postural control, core and proximal hip stability during alternating LE extension beginning in modified quadruped-knees on floor mat, UE on mat table transitioning to same activity but in full quadruped on floor with mod A from therapist to maintain level hips and for core activation and lateral stability.  Transitioned to tall kneeling to focus  on L lateral weight shifting while performing R and LLE stepping forwards, laterally and retro x 5 reps each side.  Continued training of weight shifting before stepping in standing with LUE support on wall to L side while performing taps to 6" step with cues for slow, controlled movements, to maintain R foot on step and to minimize pushing through RLE.  At end of session performed gait forwards and retro without AD but with LUE in flexion reaching up to maintain contact with therapist's hand to facilitate weight shift L during stance.  Pt with improved BOS, decreased LLE ABD and decreased pulling back to  the R.                PT Education - 08/29/17 1638    Education provided Yes   Education Details discussed recommendations if pt does go shopping at grocery store this weekend: use of cart from parking lot <> store, to go to the store at a non-busy hour and to only do a partial shopping trip-2-3 aisles at most.  Pt and husband verbalized agreement   Person(s) Educated Patient;Spouse   Methods Explanation   Comprehension Verbalized understanding          PT Short Term Goals - 08/16/17 1411      PT SHORT TERM GOAL #1   Title Pt will participate in vestibular assessment and assessment of gait velocity with LTG to be set    Time 4   Period Weeks   Status Achieved     PT SHORT TERM GOAL #2   Title Pt will improve balance and midline orientation to perform sit <> stand with supervision and be able to maintain static standing x 2 minutes without UE support and no LOB to R   Baseline met 08/16/17   Time 4   Period Weeks   Status Achieved     PT SHORT TERM GOAL #3   Title Pt will decrease falls risk as indicated by increase in BERG balance score to > or = 22/56   Baseline 17/56 to 22/56 on 08/16/17   Time 4   Period Weeks   Status Achieved     PT SHORT TERM GOAL #4   Title Pt will ambulate x 150' on indoor surfaces with LRAD and min A and require <10% assistance to maintain AD in straight path and <10% verbal cues for gait pattern.   Baseline partially met, felt that she needed mod cues for safe gait pattern.    Time 4   Period Weeks   Status Partially Met           PT Long Term Goals - 08/01/17 1447      PT LONG TERM GOAL #1   Title Pt and husband will demonstrate independence with HEP   Time 8   Period Weeks   Status New   Target Date 09/13/17     PT LONG TERM GOAL #2   Title Pt will demonstrate improved balance and decreased falls risk as indicated by BERG balance score of > or = 27/56   Time 8   Period Weeks   Status New   Target Date 09/13/17     PT  LONG TERM GOAL #3   Title Pt will ambulate with LRAD x 300' over level indoor and paved outdoor surfaces with supervision and intermittent cues for gait sequence   Time 8   Period Weeks   Status New   Target Date 09/13/17  PT LONG TERM GOAL #4   Title Pt will report 15% improvement in Neuro QOL-LE   Baseline 31.3%   Time 8   Period Weeks   Status New   Target Date 09/13/17     PT LONG TERM GOAL #5   Title Pt will decrease falls risk during gait in home/community as indicated by increase in gait velocity to > or = 1.33f/sec with LRAD   Baseline .816ft/sec with RW and up to mod A on 07/29/17   Time 8   Period Weeks   Status New   Target Date 09/13/17     PT LONG TERM GOAL #6   Title Pt will report <2/10 dizziness with head movements and will demonstrate improved gaze stability as indicated by ability to perform x 1 viewing HEP x 60 seconds in each direction   Time 8   Period Weeks   Status Revised   Target Date 09/13/17               Plan - 08/29/17 1623    Clinical Impression Statement Continued NMR with focus on proximal core and hip stability during distal LE movement in partial quadruped progressing to full quadruped on floor; pt continues to lack sufficient control and strength to perform alternating UE and LE extension.  Transitioned to NMR on tall kneeling for lateral weight shifting with R and LLE advancing forwards, laterally and posterior with mod A to maintain trunk control and weight shift.  Transitioned to standing with pt placing LUE up on wall to L and performing L lateral weight shifting with R foot taps to 6" step and maintaining for 2-3 seconds while minimizing extension through RLE.  At end of session performed gait x 230' forwards and 526 retro with LUE reaching up and to the L to continue to facilitate upright trunk/elongation and weight shift.  Pt demonstrating less LLE ABD and more normal BOS and improved sequencing overall and step length bilaterally  with forward gait.  Pt presents with extra large step length with LLE and impaired coordination with retro stepping.  Will continue to address.   Rehab Potential Good   PT Frequency 2x / week   PT Duration 8 weeks   PT Treatment/Interventions ADLs/Self Care Home Management;Electrical Stimulation;DME Instruction;Gait training;Stair training;Functional mobility training;Therapeutic activities;Therapeutic exercise;Balance training;Neuromuscular re-education;Cognitive remediation;Patient/family education;Orthotic Fit/Training;Vestibular;Visual/perceptual remediation/compensation   PT Next Visit Plan ASSESS VITALS EACH VISIT, gait without device as able to improve technique. , NMR and balance training: weight shifting to L-ambulate with LUE raised, WB for proprioceptive feedback, coordination, turns   Consulted and Agree with Plan of Care Patient;Family member/caregiver   Family Member Consulted husband      Patient will benefit from skilled therapeutic intervention in order to improve the following deficits and impairments:  Abnormal gait, Decreased balance, Decreased cognition, Decreased coordination, Decreased strength, Difficulty walking, Dizziness, Impaired sensation, Impaired vision/preception, Pain  Visit Diagnosis: Ataxic gait  Other lack of coordination  Other abnormalities of gait and mobility  Unsteadiness on feet  Dizziness and giddiness     Problem List Patient Active Problem List   Diagnosis Date Noted  . [facility]  [facility] 04[facility] 08/07/2017  . Disturbances of vision, late effect of stroke 08/06/2017  . Health education/counseling 08/05/2017  . High risk medications (not anticoagulants) long-term use 08/05/2017  . Neuritis-  R sided:  arm and leg/ body due to stroke 08/05/2017  . Elevated LDL cholesterol level 07/11/2017  . [employer] ([employer]) Stroke Scale limb ataxia score 2, ataxia present in two limbs 06/25/2017  . Alteration  of sensation as late effect of stroke 06/25/2017  . Elevated vitamin B12 level 05/30/2017  . Vitamin D deficiency 05/29/2017  . History of tobacco abuse-  30pk yr hx - quit 04/07/17 05/21/2017  . Gait disturbance, post-stroke 05/14/2017  . Benign essential HTN   . Vitreous hemorrhage of right eye ([place])   . Adjustment disorder with mixed anxiety and depressed mood   . 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 s/p SAH (subarachnoid hemorrhage) ([place]) 04/19/2017  . 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 Hypoxia   . Subarachnoid hemorrhage due to ruptured aneurysm ([place]) 04/07/2017  . CVA (cerebral vascular accident) ([place]) 04/07/2017  . Elevated gastrin level 01/25/2012  . Hypokalemia 01/21/2012  . Nausea & vomiting 01/20/2012  . Epigastric pain 01/20/2012  . Duodenal ulcer, acute with obstruction 10/17/2011  . S/P laparoscopic cholecystectomy 10/16/2011    Raylene Everts, PT, DPT 08/29/17    4:40 PM    [facility] [address] [place], [place], [address] Phone: [phone]   Fax:  [phone]  Name: DOLLIE BRESSI MRN: [id number] Date of Birth: 1976/03/09

## 2017-09-02 ENCOUNTER — Ambulatory Visit: Payer: BLUE CROSS/BLUE SHIELD | Admitting: Occupational Therapy

## 2017-09-02 ENCOUNTER — Encounter: Payer: Self-pay | Admitting: Physical Therapy

## 2017-09-02 ENCOUNTER — Ambulatory Visit: Payer: BLUE CROSS/BLUE SHIELD | Admitting: Physical Therapy

## 2017-09-02 VITALS — BP 125/75

## 2017-09-02 DIAGNOSIS — R2681 Unsteadiness on feet: Secondary | ICD-10-CM

## 2017-09-02 DIAGNOSIS — R208 Other disturbances of skin sensation: Secondary | ICD-10-CM

## 2017-09-02 DIAGNOSIS — I69054 Hemiplegia and hemiparesis following nontraumatic subarachnoid hemorrhage affecting left non-dominant side: Secondary | ICD-10-CM

## 2017-09-02 DIAGNOSIS — R414 Neurologic neglect syndrome: Secondary | ICD-10-CM

## 2017-09-02 DIAGNOSIS — R41842 Visuospatial deficit: Secondary | ICD-10-CM

## 2017-09-02 DIAGNOSIS — R2689 Other abnormalities of gait and mobility: Secondary | ICD-10-CM | POA: Diagnosis not present

## 2017-09-02 DIAGNOSIS — R260 Ataxic gait: Secondary | ICD-10-CM

## 2017-09-02 DIAGNOSIS — R41840 Attention and concentration deficit: Secondary | ICD-10-CM

## 2017-09-02 DIAGNOSIS — R278 Other lack of coordination: Secondary | ICD-10-CM

## 2017-09-02 DIAGNOSIS — I69018 Other symptoms and signs involving cognitive functions following nontraumatic subarachnoid hemorrhage: Secondary | ICD-10-CM

## 2017-09-02 DIAGNOSIS — R42 Dizziness and giddiness: Secondary | ICD-10-CM

## 2017-09-02 DIAGNOSIS — R270 Ataxia, unspecified: Secondary | ICD-10-CM

## 2017-09-02 NOTE — Patient Instructions (Signed)
Visual Compensation Strategies:  1. Look for the edge of objects (to the left and/or right) so that you make sure you are seeing all of an object 2. Turn your head when walking, scan from side to side, particularly in busy environments--Slow down 3. Use an organized scanning pattern. It's usually easier to scan from top to bottom, and left to right (like you are reading) 4. Double check yourself 5. Use a line guide (like a blank piece of paper) or your finger when reading

## 2017-09-02 NOTE — Therapy (Signed)
[facility] [address] [place], [place], [address] Phone: [phone]   Fax:  [phone]  Physical Therapy Treatment  Patient Details  Name: [name] Barnett MRN: [id number] Date of Birth: March 03, 1976 Referring Provider: Charlett Blake, MD  Encounter Date: 09/02/2017      PT End of Session - 09/02/17 1715    Visit Number 11   Number of Visits 17   Date for PT Re-Evaluation 09/13/17   Authorization Type BCBS   PT Start Time 3704   PT Stop Time 1615   PT Time Calculation (min) 45 min   Activity Tolerance Patient tolerated treatment well   Behavior During Therapy [facility] for tasks assessed/performed;Impulsive      Past Medical History:  Diagnosis Date  . Duodenal obstruction   . GERD (gastroesophageal reflux disease)   . Hypertension     Past Surgical History:  Procedure Laterality Date  . ABDOMINAL HYSTERECTOMY    . BALLOON DILATION  12/31/2011   Procedure: BALLOON DILATION;  Surgeon: Missy Sabins, MD;  Location: [facility] ENDOSCOPY;  Service: Endoscopy;  Laterality: N/A;  . CHOLECYSTECTOMY  07/28/11  . ESOPHAGOGASTRODUODENOSCOPY  10/17/2011   Procedure: ESOPHAGOGASTRODUODENOSCOPY (EGD);  Surgeon: Missy Sabins, MD;  Location: [facility] ENDOSCOPY;  Service: Endoscopy;  Laterality: N/A;  . IR ANGIO INTRA EXTRACRAN SEL INTERNAL CAROTID BILAT MOD SED  04/07/2017  . IR ANGIO VERTEBRAL SEL VERTEBRAL UNI R MOD SED  04/07/2017  . IR ANGIOGRAM FOLLOW UP STUDY  04/07/2017  . IR ANGIOGRAM FOLLOW UP STUDY  04/07/2017  . IR ANGIOGRAM FOLLOW UP STUDY  04/07/2017  . IR ANGIOGRAM FOLLOW UP STUDY  04/07/2017  . IR ANGIOGRAM FOLLOW UP STUDY  04/07/2017  . IR ANGIOGRAM SELECTIVE EACH ADDITIONAL VESSEL  04/07/2017  . IR TRANSCATH/EMBOLIZ  04/07/2017  . PARS PLANA VITRECTOMY Right 05/01/2017   Procedure: PARS PLANA VITRECTOMY WITH 25 GAUGE RIGHT EYE, endolaser photocoaglation;  Surgeon: Jalene Mullet, MD;  Location: [place];  Service: Ophthalmology;   Laterality: Right;  . RADIOLOGY WITH ANESTHESIA N/A 04/07/2017   Procedure: RADIOLOGY WITH ANESTHESIA;  Surgeon: Consuella Lose, MD;  Location: [place];  Service: Radiology;  Laterality: N/A;  . REPLACEMENT TOTAL KNEE BILATERAL  2004  . TEMPOROMANDIBULAR JOINT SURGERY     2 surgeries  . TUMOR REMOVAL    . VAGOTOMY  01/23/2012   Procedure: VAGOTOMY, antrectomy and BII;  Surgeon: Haywood Lasso, MD;  Location: [place];  Service: General;  Laterality: N/A;  Laparotomy with vagotomy.    Vitals:   09/02/17 1542  BP: 125/75        Subjective Assessment - 09/02/17 1542    Subjective Pt reports going to grocery store this weekend!  Walked with cart x 15-20 minutes and helped put items on check out belt.  Was fatigued afterwards.  Was also able to help make husband's birthday cake this weekend with mother's assistance.   Patient is accompained by: Family member   Limitations Standing;Walking   Currently in Pain? No/denies                         [facility] Adult PT Treatment/Exercise - 09/02/17 1711      Neuro Re-ed    Neuro Re-ed Details  NMR for continued focus on weight shifting, grading of movement, coordination and trunk/postural control with 3 laps of forwards gait x 115' against resistance of sports cord with husband providing UE support for balance and therapist  providing graded resistance; performed resisted anterior weight shifting and pelvic rotations with forwards and retro stepping in place and finished with L lateral stepping against resistance with therapist changing places with husband to provide necessary facilitation at trunk and pelvis for full lateral weight shift to LLE without knee flexion or trunk flexion.  Throughout session pt reported increased pain in R groin where she had a catheterization.                  PT Education - 09/02/17 1714    Education provided Yes   Education Details gentle scar massage to loosen adhesions if present   Person(s)  Educated Patient;Spouse   Methods Explanation;Demonstration   Comprehension Verbalized understanding          PT Short Term Goals - 08/16/17 1411      PT SHORT TERM GOAL #1   Title Pt will participate in vestibular assessment and assessment of gait velocity with LTG to be set    Time 4   Period Weeks   Status Achieved     PT SHORT TERM GOAL #2   Title Pt will improve balance and midline orientation to perform sit <> stand with supervision and be able to maintain static standing x 2 minutes without UE support and no LOB to R   Baseline met 08/16/17   Time 4   Period Weeks   Status Achieved     PT SHORT TERM GOAL #3   Title Pt will decrease falls risk as indicated by increase in BERG balance score to > or = 22/56   Baseline 17/56 to 22/56 on 08/16/17   Time 4   Period Weeks   Status Achieved     PT SHORT TERM GOAL #4   Title Pt will ambulate x 150' on indoor surfaces with LRAD and min A and require <10% assistance to maintain AD in straight path and <10% verbal cues for gait pattern.   Baseline partially met, felt that she needed mod cues for safe gait pattern.    Time 4   Period Weeks   Status Partially Met           PT Long Term Goals - 08/01/17 1447      PT LONG TERM GOAL #1   Title Pt and husband will demonstrate independence with HEP   Time 8   Period Weeks   Status New   Target Date 09/13/17     PT LONG TERM GOAL #2   Title Pt will demonstrate improved balance and decreased falls risk as indicated by BERG balance score of > or = 27/56   Time 8   Period Weeks   Status New   Target Date 09/13/17     PT LONG TERM GOAL #3   Title Pt will ambulate with LRAD x 300' over level indoor and paved outdoor surfaces with supervision and intermittent cues for gait sequence   Time 8   Period Weeks   Status New   Target Date 09/13/17     PT LONG TERM GOAL #4   Title Pt will report 15% improvement in Neuro QOL-LE   Baseline 31.3%   Time 8   Period Weeks   Status  New   Target Date 09/13/17     PT LONG TERM GOAL #5   Title Pt will decrease falls risk during gait in home/community as indicated by increase in gait velocity to > or = 1.12f/sec with LRAD   Baseline .86 ft/sec  with RW and up to mod A on 07/29/17   Time 8   Period Weeks   Status New   Target Date 09/13/17     PT LONG TERM GOAL #6   Title Pt will report <2/10 dizziness with head movements and will demonstrate improved gaze stability as indicated by ability to perform x 1 viewing HEP x 60 seconds in each direction   Time 8   Period Weeks   Status Revised   Target Date 09/13/17               Plan - 09/02/17 1716    Clinical Impression Statement Continued NMR for weight shifting and coordination, motor planning and grading of movement during gait with use of graded resistance during gait with sports cord.  Pt demonstrated increased difficulty with lateral stepping and lateral weight shift and was limited by pain in R groin.  Will inspect at next session for scar tissue/adhesions and provide pt with stretching if needed.  Pt did demonstrate decreased "stomping" today and more controlled LE advancement.  Will continue to progress.   Rehab Potential Good   PT Frequency 2x / week   PT Duration 8 weeks   PT Treatment/Interventions ADLs/Self Care Home Management;Electrical Stimulation;DME Instruction;Gait training;Stair training;Functional mobility training;Therapeutic activities;Therapeutic exercise;Balance training;Neuromuscular re-education;Cognitive remediation;Patient/family education;Orthotic Fit/Training;Vestibular;Visual/perceptual remediation/compensation   PT Next Visit Plan ASSESS VITALS EACH; assess R groin for scar tissue/adhesions.  NMR and balance training: weight shifting to L-ambulate with LUE raised or against resistance, WB for proprioceptive feedback, coordination, turns, treadmill, side stepping L, step ups   Consulted and Agree with Plan of Care Patient;Family  member/caregiver   Family Member Consulted husband      Patient will benefit from skilled therapeutic intervention in order to improve the following deficits and impairments:  Abnormal gait, Decreased balance, Decreased cognition, Decreased coordination, Decreased strength, Difficulty walking, Dizziness, Impaired sensation, Impaired vision/preception, Pain  Visit Diagnosis: Ataxic gait  Other abnormalities of gait and mobility  Unsteadiness on feet  Other lack of coordination  Dizziness and giddiness     Problem List Patient Active Problem List   Diagnosis Date Noted  . Weakness with dizziness-  since [facility] and CVA 04/07/17 08/07/2017  . Disturbances of vision, late effect of stroke 08/06/2017  . Health education/counseling 08/05/2017  . High risk medications (not anticoagulants) long-term use 08/05/2017  . Neuritis-  R sided:  arm and leg/ body due to stroke 08/05/2017  . Elevated LDL cholesterol level 07/11/2017  . [employer] (NIH) Stroke Scale limb ataxia score 2, ataxia present in two limbs 06/25/2017  . 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 Elevated vitamin B12 level 05/30/2017  . Vitamin D deficiency 05/29/2017  . History of tobacco abuse-  30pk yr hx - quit 04/07/17 05/21/2017  . Gait disturbance, post-stroke 05/14/2017  . Benign essential HTN   . Vitreous hemorrhage of right eye ([place])   . Adjustment disorder with mixed anxiety and depressed mood   . 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 s/p SAH (subarachnoid hemorrhage) ([place]) 04/19/2017  . 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 Hypoxia   . Subarachnoid hemorrhage due to ruptured aneurysm ([place]) 04/07/2017  . CVA (cerebral vascular accident) ([place]) 04/07/2017  . Elevated gastrin level 01/25/2012  . Hypokalemia 01/21/2012  . Nausea & vomiting 01/20/2012  . Epigastric pain 01/20/2012  . Duodenal ulcer, acute  with obstruction  10/17/2011  . S/P laparoscopic cholecystectomy 10/16/2011    Raylene Everts, PT, DPT 09/02/17    5:19 PM    [facility] [address] [place], [place], [address] Phone: [phone]   Fax:  [phone]  Name: [name] Barnett MRN: [id number] Date of Birth: 1976-01-13

## 2017-09-02 NOTE — Therapy (Signed)
[facility] [address] [place], [place], [address] Phone: [phone]   Fax:  [phone]  Occupational Therapy Treatment  Patient Details  Name: [name] Barnett MRN: [id number] Date of Birth: 01-09-1976 Referring Provider: Dr. Alysia Penna  Encounter Date: 09/02/2017      OT End of Session - 09/02/17 1456    Visit Number 10   Number of Visits 17   Date for OT Re-Evaluation 09/13/17   Authorization Type BCBS, no visit limit/auth req.   OT Start Time 1450   OT Stop Time 1530   OT Time Calculation (min) 40 min   Activity Tolerance Patient tolerated treatment well   Behavior During Therapy [facility]FL for tasks assessed/performed      Past Medical History:  Diagnosis Date  . Duodenal obstruction   . GERD (gastroesophageal reflux disease)   . Hypertension     Past Surgical History:  Procedure Laterality Date  . ABDOMINAL HYSTERECTOMY    . BALLOON DILATION  12/31/2011   Procedure: BALLOON DILATION;  Surgeon: Missy Sabins, MD;  Location: [facility] ENDOSCOPY;  Service: Endoscopy;  Laterality: N/A;  . CHOLECYSTECTOMY  07/28/11  . ESOPHAGOGASTRODUODENOSCOPY  10/17/2011   Procedure: ESOPHAGOGASTRODUODENOSCOPY (EGD);  Surgeon: Missy Sabins, MD;  Location: [facility] ENDOSCOPY;  Service: Endoscopy;  Laterality: N/A;  . IR ANGIO INTRA EXTRACRAN SEL INTERNAL CAROTID BILAT MOD SED  04/07/2017  . IR ANGIO VERTEBRAL SEL VERTEBRAL UNI R MOD SED  04/07/2017  . IR ANGIOGRAM FOLLOW UP STUDY  04/07/2017  . IR ANGIOGRAM FOLLOW UP STUDY  04/07/2017  . IR ANGIOGRAM FOLLOW UP STUDY  04/07/2017  . IR ANGIOGRAM FOLLOW UP STUDY  04/07/2017  . IR ANGIOGRAM FOLLOW UP STUDY  04/07/2017  . IR ANGIOGRAM SELECTIVE EACH ADDITIONAL VESSEL  04/07/2017  . IR TRANSCATH/EMBOLIZ  04/07/2017  . PARS PLANA VITRECTOMY Right 05/01/2017   Procedure: PARS PLANA VITRECTOMY WITH 25 GAUGE RIGHT EYE, endolaser photocoaglation;  Surgeon: Jalene Mullet, MD;  Location: [facility];  Service:  Ophthalmology;  Laterality: Right;  . RADIOLOGY WITH ANESTHESIA N/A 04/07/2017   Procedure: RADIOLOGY WITH ANESTHESIA;  Surgeon: Consuella Lose, MD;  Location: [facility];  Service: Radiology;  Laterality: N/A;  . REPLACEMENT TOTAL KNEE BILATERAL  2004  . TEMPOROMANDIBULAR JOINT SURGERY     2 surgeries  . TUMOR REMOVAL    . VAGOTOMY  01/23/2012   Procedure: VAGOTOMY, antrectomy and BII;  Surgeon: Haywood Lasso, MD;  Location: [facility];  Service: General;  Laterality: N/A;  Laparotomy with vagotomy.    There were no vitals filed for this visit.      Subjective Assessment - 09/02/17 1452    Subjective  Neuro ophthalmology appt tomorrow.  Made mashed potatoes (poured milk with LUE and butter, mixed it) and birthday cake with help (mother put in oven).  Went to grocery store and used cart, put stuff from cart on belt.   Patient is accompained by: Family member   Pertinent History Subarachnoid hemorrhage due to ruptured aneurysm, ataxia (L cerebellar); HTN, R side decr sensation, vertical diplopia/visual deficits, ataxia, Bilateral retinal hemorhage associated with SAH (with surgery on R eye in hospital and L approx 1 month ago)   Limitations visual deficits, fall risk, impulsivity/cognitive deficits   Patient Stated Goals be able to walk and use L arm   Currently in Pain? No/denies      While wearing glasses (no diplopia in primary gaze for tabletop activities).  Using "Spot It" for visual scanning  and incr attention to task.  Pt with approx 80% accuracy (min cueing for impulsivity and use of strategy) and initiated use of LUE.    Attempted to place grooved pegs in pegboard.  Pt was able to place in 1 with max difficulty.  Impulsivity incr ataxia and frustration affected performance.  Pt given cues to slow down, watch hand, and support elbow on table.  Then switched to placing in medium pegs and pt placed in pegboard with min-mod difficulty with LUE with min-mod cues to slow down and  attend/watch hand.                            OT Education - 09/02/17 1607    Education Details visual compensation strategies   Person(s) Educated Patient;Spouse   Methods Explanation;Demonstration;Handout   Comprehension Verbalized understanding;Returned demonstration          OT Short Term Goals - 08/22/17 1559      OT SHORT TERM GOAL #1   Title Pt/caregiver will be independent with initial HEP.--check STGs 08/14/17   Time 4   Period Weeks   Status Achieved  08/19/17:  with cueing from husband     OT [place] #2   Title Pt will be use LUE as nondominant assist at least 50% of the time for ADLs without cueing.   Time 4   Period Weeks   Status On-going  08/19/17:  approx 40% of the time     OT SHORT TERM GOAL #3   Title Pt will improve LUE coordination/control to improve score on box and blocks test by at least 6 with LUE.   Baseline 19 blocks   Time 4   Period Weeks   Status On-going  08/22/17:  18, 20 blocks     OT SHORT TERM GOAL #4   Title Pt will perform simple home maintenance task/snack prep in sitting with set-up.   Time 4   Period Weeks   Status Achieved  08/19/17:  met in clinic with set-up and cueing/prompts for encouragement/to slow down     OT SHORT TERM GOAL #5   Title Pt/husband will verbalize understanding of visual compensation stategies for improved safety/incr ease with ADLs.   Time 4   Period Weeks   Status On-going           OT Long Term Goals - 07/16/17 1907      OT LONG TERM GOAL #1   Title Pt/caregiver will be independent with updated HEP.--check LTGs 09/13/17   Time 8   Period Weeks   Status New     OT LONG TERM GOAL #2   Title Pt will be use LUE as nondominant assist at least 75% of the time for ADLs without cueing.   Time 8   Period Weeks   Status New     OT LONG TERM GOAL #3   Title Pt will perform toileting mod I (including transfer).   Time 8   Period Weeks   Status New     OT LONG TERM  GOAL #4   Title Pt will improve LUE coordination/control to improve score on box and blocks test by at least 12 with LUE.   Baseline 19 blocks   Time 8   Period Weeks   Status New     OT LONG TERM GOAL #5   Title Pt will perform simple home maintenance task/snack prep in standing with set-up/supervision.   Time  8   Period Weeks   Status New     OT LONG TERM GOAL #6   Title Pt will perform simple environmental scanning/navigation with supervision.   Time 8   Period Weeks   Status New               Plan - 09/02/17 1459    Clinical Impression Statement Pt is progressing towards goals with incr participation with IADLs and improving LUE functional use.  Pt also progressing with tabletop visual scanning.     Rehab Potential Good   Current Impairments/barriers affecting progress: cognitive deficits, impulsivity   OT Frequency 2x / week   OT Duration 8 weeks   OT Treatment/Interventions Self-care/ADL training;DME and/or AE instruction;Patient/family education;Therapeutic exercises;Balance training;Fluidtherapy;Moist Heat;Ultrasound;Therapeutic exercise;Therapeutic activities;Cryotherapy;Neuromuscular education;Functional Mobility Training;Passive range of motion;Cognitive remediation/compensation;Visual/perceptual remediation/compensation;Manual Therapy   Plan continue with LUE functional use/control, visual scanning, wt. bearing/neuro re-ed   OT Home Exercise Plan Education provided:  HEP for LUE control, coordination, and functional use   Consulted and Agree with Plan of Care Patient;Family member/caregiver   Family Member Consulted husband      Patient will benefit from skilled therapeutic intervention in order to improve the following deficits and impairments:  Decreased coordination, Decreased range of motion, Difficulty walking, Abnormal gait, Decreased safety awareness, Impaired sensation, Decreased knowledge of precautions, Decreased balance, Decreased knowledge of use of  DME, Impaired UE functional use, Decreased cognition, Decreased mobility, Decreased strength, Impaired vision/preception, Impaired perceived functional ability  Visit Diagnosis: Other lack of coordination  Unsteadiness on feet  Visuospatial deficit  Other disturbances of skin sensation  Attention and concentration deficit  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 Weakness with dizziness-  since [facility] and CVA 04/07/17 08/07/2017  . Disturbances of vision, late effect of stroke 08/06/2017  . Health education/counseling 08/05/2017  . High risk medications (not anticoagulants) long-term use 08/05/2017  . Neuritis-  R sided:  arm and leg/ body due to stroke 08/05/2017  . Elevated LDL cholesterol level 07/11/2017  . [employer] (NIH) Stroke Scale limb ataxia score 2, ataxia present in two limbs 06/25/2017  . 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 Elevated vitamin B12 level 05/30/2017  . Vitamin D deficiency 05/29/2017  . History of tobacco abuse-  30pk yr hx - quit 04/07/17 05/21/2017  . Gait disturbance, post-stroke 05/14/2017  . Benign essential HTN   . Vitreous hemorrhage of right eye ([place])   . Adjustment disorder with mixed anxiety and depressed mood   . 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 s/p SAH (subarachnoid hemorrhage) ([place]) 04/19/2017  . 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 Hypoxia   . Subarachnoid hemorrhage due to ruptured aneurysm ([place]) 04/07/2017  . CVA (cerebral vascular accident) ([place]) 04/07/2017  . Elevated gastrin level 01/25/2012  . Hypokalemia 01/21/2012  . Nausea &  vomiting 01/20/2012  . Epigastric pain 01/20/2012  . Duodenal ulcer, acute with obstruction 10/17/2011  . S/P laparoscopic cholecystectomy 10/16/2011    [facility] 09/02/2017, 4:10 PM  [facility] [address] [place], [place], [address] Phone: [phone]   Fax:  [phone]  Name: ANNA BEAIRD MRN: [id number]  Date of Birth: Nov 12, 1976   Vianne Bulls, OTR/L [facility] [address] [place], [place]  [address] [phone] phone [phone] 09/02/17 4:10 PM

## 2017-09-03 ENCOUNTER — Ambulatory Visit: Payer: BLUE CROSS/BLUE SHIELD | Admitting: Physical Medicine & Rehabilitation

## 2017-09-04 ENCOUNTER — Ambulatory Visit: Payer: BLUE CROSS/BLUE SHIELD | Admitting: Physical Therapy

## 2017-09-04 ENCOUNTER — Ambulatory Visit: Payer: BLUE CROSS/BLUE SHIELD | Admitting: Occupational Therapy

## 2017-09-04 ENCOUNTER — Encounter: Payer: Self-pay | Admitting: Physical Therapy

## 2017-09-04 VITALS — BP 100/70

## 2017-09-04 DIAGNOSIS — R278 Other lack of coordination: Secondary | ICD-10-CM

## 2017-09-04 DIAGNOSIS — R41840 Attention and concentration deficit: Secondary | ICD-10-CM

## 2017-09-04 DIAGNOSIS — R42 Dizziness and giddiness: Secondary | ICD-10-CM

## 2017-09-04 DIAGNOSIS — R41842 Visuospatial deficit: Secondary | ICD-10-CM

## 2017-09-04 DIAGNOSIS — R2681 Unsteadiness on feet: Secondary | ICD-10-CM

## 2017-09-04 DIAGNOSIS — R2689 Other abnormalities of gait and mobility: Secondary | ICD-10-CM

## 2017-09-04 DIAGNOSIS — R208 Other disturbances of skin sensation: Secondary | ICD-10-CM

## 2017-09-04 DIAGNOSIS — R260 Ataxic gait: Secondary | ICD-10-CM

## 2017-09-04 NOTE — Therapy (Signed)
[facility] [address] [place], [place], [address] Phone: [phone]   Fax:  [phone]  Physical Therapy Treatment  Patient Details  Name: [name] Barnett MRN: [id number] Date of Birth: [birth year] Referring Provider: Charlett Blake, MD  Encounter Date: 09/04/2017      PT End of Session - 09/04/17 1633    Visit Number 12   Number of Visits 17   Date for PT Re-Evaluation 09/13/17   Authorization Type BCBS   PT Start Time 1536   PT Stop Time 1620   PT Time Calculation (min) 44 min   Activity Tolerance Patient tolerated treatment well   Behavior During Therapy [facility] for tasks assessed/performed      Past Medical History:  Diagnosis Date  . Duodenal obstruction   . GERD (gastroesophageal reflux disease)   . Hypertension     Past Surgical History:  Procedure Laterality Date  . ABDOMINAL HYSTERECTOMY    . BALLOON DILATION  12/31/2011   Procedure: BALLOON DILATION;  Surgeon: Missy Sabins, MD;  Location: [facility] ENDOSCOPY;  Service: Endoscopy;  Laterality: N/A;  . CHOLECYSTECTOMY  07/28/11  . ESOPHAGOGASTRODUODENOSCOPY  10/17/2011   Procedure: ESOPHAGOGASTRODUODENOSCOPY (EGD);  Surgeon: Missy Sabins, MD;  Location: [facility] ENDOSCOPY;  Service: Endoscopy;  Laterality: N/A;  . IR ANGIO INTRA EXTRACRAN SEL INTERNAL CAROTID BILAT MOD SED  04/07/2017  . IR ANGIO VERTEBRAL SEL VERTEBRAL UNI R MOD SED  04/07/2017  . IR ANGIOGRAM FOLLOW UP STUDY  04/07/2017  . IR ANGIOGRAM FOLLOW UP STUDY  04/07/2017  . IR ANGIOGRAM FOLLOW UP STUDY  04/07/2017  . IR ANGIOGRAM FOLLOW UP STUDY  04/07/2017  . IR ANGIOGRAM FOLLOW UP STUDY  04/07/2017  . IR ANGIOGRAM SELECTIVE EACH ADDITIONAL VESSEL  04/07/2017  . IR TRANSCATH/EMBOLIZ  04/07/2017  . PARS PLANA VITRECTOMY Right 05/01/2017   Procedure: PARS PLANA VITRECTOMY WITH 25 GAUGE RIGHT EYE, endolaser photocoaglation;  Surgeon: Jalene Mullet, MD;  Location: [facility];  Service: Ophthalmology;  Laterality:  Right;  . RADIOLOGY WITH ANESTHESIA N/A 04/07/2017   Procedure: RADIOLOGY WITH ANESTHESIA;  Surgeon: Consuella Lose, MD;  Location: [facility];  Service: Radiology;  Laterality: N/A;  . REPLACEMENT TOTAL KNEE BILATERAL  2004  . TEMPOROMANDIBULAR JOINT SURGERY     2 surgeries  . TUMOR REMOVAL    . VAGOTOMY  01/23/2012   Procedure: VAGOTOMY, antrectomy and BII;  Surgeon: Haywood Lasso, MD;  Location: [facility];  Service: General;  Laterality: N/A;  Laparotomy with vagotomy.    Vitals:   09/04/17 1544  BP: 100/70        Subjective Assessment - 09/04/17 1537    Subjective No issues to report.  Has not been out in the community and has not cooked since this weekend.   Patient is accompained by: Family member   Limitations Standing;Walking   Currently in Pain? No/denies           [facility] Adult PT Treatment/Exercise - 09/04/17 1558      Knee/Hip Exercises: Standing   Lateral Step Up Right;Left;1 set;10 reps;Hand Hold: 2;Step Height: 6"   Lateral Step Up Limitations focus on controlled weight shifting laterally and controlled hip and knee extension without genu recurvatum; focused on slow, controlled movements             Balance Exercises - 09/04/17 1612      Balance Exercises: Standing   SLS Eyes open;Solid surface;Upper extremity support 2;2 reps  partial with R foot on  step; head turns/nods x 10 reps   Cone Rotation Solid surface;Upper extremity assist 2   Cone Rotation Limitations Focused on standing balance on one leg and use of contralateral LE performing controlled cone tipping and back up forwards, back and side to side           PT Education - 09/04/17 1632    Education provided Yes   Education Details weight shifting, single limb stance   Person(s) Educated Patient;Spouse   Methods Explanation   Comprehension Verbalized understanding;Returned demonstration          PT Short Term Goals - 08/16/17 1411      PT SHORT TERM GOAL #1   Title Pt will  participate in vestibular assessment and assessment of gait velocity with LTG to be set    Time 4   Period Weeks   Status Achieved     PT SHORT TERM GOAL #2   Title Pt will improve balance and midline orientation to perform sit <> stand with supervision and be able to maintain static standing x 2 minutes without UE support and no LOB to R   Baseline met 08/16/17   Time 4   Period Weeks   Status Achieved     PT SHORT TERM GOAL #3   Title Pt will decrease falls risk as indicated by increase in BERG balance score to > or = 22/56   Baseline 17/56 to 22/56 on 08/16/17   Time 4   Period Weeks   Status Achieved     PT SHORT TERM GOAL #4   Title Pt will ambulate x 150' on indoor surfaces with LRAD and min A and require <10% assistance to maintain AD in straight path and <10% verbal cues for gait pattern.   Baseline partially met, felt that she needed mod cues for safe gait pattern.    Time 4   Period Weeks   Status Partially Met           PT Long Term Goals - 08/01/17 1447      PT LONG TERM GOAL #1   Title Pt and husband will demonstrate independence with HEP   Time 8   Period Weeks   Status New   Target Date 09/13/17     PT LONG TERM GOAL #2   Title Pt will demonstrate improved balance and decreased falls risk as indicated by BERG balance score of > or = 27/56   Time 8   Period Weeks   Status New   Target Date 09/13/17     PT LONG TERM GOAL #3   Title Pt will ambulate with LRAD x 300' over level indoor and paved outdoor surfaces with supervision and intermittent cues for gait sequence   Time 8   Period Weeks   Status New   Target Date 09/13/17     PT LONG TERM GOAL #4   Title Pt will report 15% improvement in Neuro QOL-LE   Baseline 31.3%   Time 8   Period Weeks   Status New   Target Date 09/13/17     PT LONG TERM GOAL #5   Title Pt will decrease falls risk during gait in home/community as indicated by increase in gait velocity to > or = 1.98f/sec with LRAD    Baseline .817ft/sec with RW and up to mod A on 07/29/17   Time 8   Period Weeks   Status New   Target Date 09/13/17     PT LONG  TERM GOAL #6   Title Pt will report <2/10 dizziness with head movements and will demonstrate improved gaze stability as indicated by ability to perform x 1 viewing HEP x 60 seconds in each direction   Time 8   Period Weeks   Status Revised   Target Date 09/13/17               Plan - 09/04/17 1634    Clinical Impression Statement Continued NMR to focus on control of weight shifting in various directions, motor control and grading of movement and incorporation of vestibular stimulation to balance training.  Pt tolerated well with some LLE fatigue but became very frustrated when she became dizzy during head turns.  Discussed addition of vestibular stimulation to balance training.  Will continue to address.   Rehab Potential Good   PT Frequency 2x / week   PT Duration 8 weeks   PT Treatment/Interventions ADLs/Self Care Home Management;Electrical Stimulation;DME Instruction;Gait training;Stair training;Functional mobility training;Therapeutic activities;Therapeutic exercise;Balance training;Neuromuscular re-education;Cognitive remediation;Patient/family education;Orthotic Fit/Training;Vestibular;Visual/perceptual remediation/compensation   PT Next Visit Plan ASSESS VITALS EACH; start checking LTG-recert on 95/0;  treadmill with gait trainer; lateral step ups for weight shift.  Standing with partial single limb stance on LLE (RLE on block) and maintaining balance with head turns for vestibular input.  Gait decreasing UE support   Consulted and Agree with Plan of Care Patient;Family member/caregiver   Family Member Consulted husband      Patient will benefit from skilled therapeutic intervention in order to improve the following deficits and impairments:  Abnormal gait, Decreased balance, Decreased cognition, Decreased coordination, Decreased strength, Difficulty  walking, Dizziness, Impaired sensation, Impaired vision/preception, Pain  Visit Diagnosis: Ataxic gait  Other lack of coordination  Other abnormalities of gait and mobility  Unsteadiness on feet  Dizziness and giddiness     Problem List Patient Active Problem List   Diagnosis Date Noted  . Weakness with dizziness-  since [facility] and CVA 04/07/17 08/07/2017  . Disturbances of vision, late effect of stroke 08/06/2017  . Health education/counseling 08/05/2017  . High risk medications (not anticoagulants) long-term use 08/05/2017  . Neuritis-  R sided:  arm and leg/ body due to stroke 08/05/2017  . Elevated LDL cholesterol level 07/11/2017  . [employer] (NIH) Stroke Scale limb ataxia score 2, ataxia present in two limbs 06/25/2017  . 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 Elevated vitamin B12 level 05/30/2017  . Vitamin D deficiency 05/29/2017  . History of tobacco abuse-  30pk yr hx - quit 04/07/17 05/21/2017  . Gait disturbance, post-stroke 05/14/2017  . Benign essential HTN   . Vitreous hemorrhage of right eye ([place])   . Adjustment disorder with mixed anxiety and depressed mood   . 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 s/p SAH (subarachnoid hemorrhage) ([place]) 04/19/2017  . 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 Hypoxia   . Subarachnoid hemorrhage due to ruptured aneurysm ([place]) 04/07/2017  . CVA (cerebral vascular accident) ([place]) 04/07/2017  . Elevated gastrin level 01/25/2012  . Hypokalemia 01/21/2012  . Nausea & vomiting 01/20/2012  . Epigastric pain 01/20/2012  . Duodenal ulcer, acute with obstruction 10/17/2011  . S/P laparoscopic cholecystectomy 10/16/2011    Raylene Everts, PT, DPT 09/04/17    4:43 PM    [facility] [address] [place], [place], [address] Phone: [phone]   Fax:  [phone]  Name: [name] Barnett MRN: [id number] Date of Birth: 08/09/1976

## 2017-09-05 ENCOUNTER — Encounter (HOSPITAL_COMMUNITY): Payer: Self-pay

## 2017-09-05 ENCOUNTER — Emergency Department (HOSPITAL_COMMUNITY): Payer: BLUE CROSS/BLUE SHIELD

## 2017-09-05 ENCOUNTER — Ambulatory Visit (HOSPITAL_BASED_OUTPATIENT_CLINIC_OR_DEPARTMENT_OTHER): Payer: BLUE CROSS/BLUE SHIELD | Admitting: Physical Medicine & Rehabilitation

## 2017-09-05 ENCOUNTER — Encounter: Payer: BLUE CROSS/BLUE SHIELD | Attending: Physical Medicine & Rehabilitation

## 2017-09-05 ENCOUNTER — Encounter: Payer: Self-pay | Admitting: Physical Medicine & Rehabilitation

## 2017-09-05 ENCOUNTER — Emergency Department (HOSPITAL_COMMUNITY)
Admission: EM | Admit: 2017-09-05 | Discharge: 2017-09-06 | Disposition: A | Discharge status: Home / Self Care | Payer: BLUE CROSS/BLUE SHIELD | Attending: Physician Assistant | Admitting: Physician Assistant

## 2017-09-05 VITALS — BP 131/74 | HR 73

## 2017-09-05 DIAGNOSIS — R209 Unspecified disturbances of skin sensation: Secondary | ICD-10-CM | POA: Diagnosis not present

## 2017-09-05 DIAGNOSIS — Z96653 Presence of artificial knee joint, bilateral: Secondary | ICD-10-CM | POA: Insufficient documentation

## 2017-09-05 DIAGNOSIS — R269 Unspecified abnormalities of gait and mobility: Secondary | ICD-10-CM

## 2017-09-05 DIAGNOSIS — Z79899 Other long term (current) drug therapy: Secondary | ICD-10-CM | POA: Diagnosis not present

## 2017-09-05 DIAGNOSIS — R42 Dizziness and giddiness: Secondary | ICD-10-CM | POA: Insufficient documentation

## 2017-09-05 DIAGNOSIS — H539 Unspecified visual disturbance: Secondary | ICD-10-CM

## 2017-09-05 DIAGNOSIS — I725 Aneurysm of other precerebral arteries: Secondary | ICD-10-CM | POA: Insufficient documentation

## 2017-09-05 DIAGNOSIS — Z87891 Personal history of nicotine dependence: Secondary | ICD-10-CM | POA: Diagnosis not present

## 2017-09-05 DIAGNOSIS — N3000 Acute cystitis without hematuria: Secondary | ICD-10-CM | POA: Diagnosis not present

## 2017-09-05 DIAGNOSIS — H4311 Vitreous hemorrhage, right eye: Secondary | ICD-10-CM | POA: Insufficient documentation

## 2017-09-05 DIAGNOSIS — I10 Essential (primary) hypertension: Secondary | ICD-10-CM | POA: Diagnosis not present

## 2017-09-05 DIAGNOSIS — I69393 Ataxia following cerebral infarction: Secondary | ICD-10-CM | POA: Diagnosis not present

## 2017-09-05 DIAGNOSIS — I69398 Other sequelae of cerebral infarction: Secondary | ICD-10-CM

## 2017-09-05 DIAGNOSIS — R51 Headache: Secondary | ICD-10-CM

## 2017-09-05 DIAGNOSIS — I69093 Ataxia following nontraumatic subarachnoid hemorrhage: Secondary | ICD-10-CM | POA: Insufficient documentation

## 2017-09-05 DIAGNOSIS — Z8673 Personal history of transient ischemic attack (TIA), and cerebral infarction without residual deficits: Secondary | ICD-10-CM | POA: Insufficient documentation

## 2017-09-05 HISTORY — DX: Cerebral infarction, unspecified: I63.9

## 2017-09-05 LAB — COMPREHENSIVE METABOLIC PANEL
ALBUMIN: 3.5 g/dL (ref 3.5–5.0)
ALT: 17 U/L (ref 14–54)
ANION GAP: 9 (ref 5–15)
AST: 20 U/L (ref 15–41)
Alkaline Phosphatase: 90 U/L (ref 38–126)
BUN: 16 mg/dL (ref 6–20)
CALCIUM: 8.7 mg/dL — AB (ref 8.9–10.3)
CO2: 20 mmol/L — ABNORMAL LOW (ref 22–32)
CREATININE: 0.78 mg/dL (ref 0.44–1.00)
Chloride: 108 mmol/L (ref 101–111)
GFR calc Af Amer: >60 mL/min (ref >60)
Glucose, Bld: 109 mg/dL — ABNORMAL HIGH (ref 65–99)
Potassium: 4.5 mmol/L (ref 3.5–5.1)
Sodium: 137 mmol/L (ref 135–145)
TOTAL PROTEIN: 6.3 g/dL — AB (ref 6.5–8.1)
Total Bilirubin: 0.3 mg/dL (ref 0.3–1.2)

## 2017-09-05 LAB — DIFFERENTIAL
BASOS ABS: 0 10*3/uL (ref 0.0–0.1)
BASOS PCT: 0 %
EOS ABS: 0.1 10*3/uL (ref 0.0–0.7)
Eosinophils Relative: 1 %
Lymphocytes Relative: 18 %
Lymphs Abs: 1.1 10*3/uL (ref 0.7–4.0)
MONOS PCT: 0 %
Monocytes Absolute: 0 10*3/uL — ABNORMAL LOW (ref 0.1–1.0)
NEUTROS PCT: 81 %
Neutro Abs: 5.1 10*3/uL (ref 1.7–7.7)

## 2017-09-05 LAB — PROTIME-INR
INR: 0.93
PROTHROMBIN TIME: 12.4 s (ref 11.4–15.2)

## 2017-09-05 LAB — CBC
HCT: 38.1 % (ref 36.0–46.0)
Hemoglobin: 11.5 g/dL — ABNORMAL LOW (ref 12.0–15.0)
MCH: 26.7 pg (ref 26.0–34.0)
MCHC: 30.2 g/dL (ref 30.0–36.0)
MCV: 88.6 fL (ref 78.0–100.0)
Platelets: 239 10*3/uL (ref 150–400)
RBC: 4.3 MIL/uL (ref 3.87–5.11)
RDW: 20.2 % — AB (ref 11.5–15.5)
WBC: 6.3 10*3/uL (ref 4.0–10.5)

## 2017-09-05 LAB — URINALYSIS, ROUTINE W REFLEX MICROSCOPIC
BILIRUBIN URINE: NEGATIVE
Glucose, UA: NEGATIVE mg/dL
Ketones, ur: NEGATIVE mg/dL
Nitrite: POSITIVE — AB
PH: 5 (ref 5.0–8.0)
Protein, ur: 30 mg/dL — AB
SPECIFIC GRAVITY, URINE: 1.018 (ref 1.005–1.030)

## 2017-09-05 LAB — CBG MONITORING, ED: GLUCOSE-CAPILLARY: 105 mg/dL — AB (ref 65–99)

## 2017-09-05 LAB — I-STAT CHEM 8, ED
BUN: 18 mg/dL (ref 6–20)
CALCIUM ION: 1.06 mmol/L — AB (ref 1.15–1.40)
CREATININE: 0.7 mg/dL (ref 0.44–1.00)
Chloride: 106 mmol/L (ref 101–111)
GLUCOSE: 109 mg/dL — AB (ref 65–99)
HCT: 38 % (ref 36.0–46.0)
HEMOGLOBIN: 12.9 g/dL (ref 12.0–15.0)
Potassium: 4.3 mmol/L (ref 3.5–5.1)
Sodium: 138 mmol/L (ref 135–145)
TCO2: 23 mmol/L (ref 22–32)

## 2017-09-05 LAB — APTT: APTT: 28 s (ref 24–36)

## 2017-09-05 LAB — I-STAT TROPONIN, ED: TROPONIN I, POC: 0.01 ng/mL (ref 0.00–0.08)

## 2017-09-05 LAB — I-STAT CG4 LACTIC ACID, ED: LACTIC ACID, VENOUS: 1.48 mmol/L (ref 0.5–1.9)

## 2017-09-05 MED ORDER — GABAPENTIN 600 MG PO TABS: 600.0000 mg | ORAL_TABLET | Freq: Four times a day (QID) | ORAL | 1 refills | Status: DC

## 2017-09-05 MED ORDER — DIPHENHYDRAMINE HCL 50 MG/ML IJ SOLN
25.0000 mg | Freq: Once | INTRAMUSCULAR | Status: AC
  Administered 2017-09-05: 25 mg via INTRAVENOUS
  Filled 2017-09-05: qty 1

## 2017-09-05 MED ORDER — GABAPENTIN 600 MG PO TABS: 600.0000 mg | ORAL_TABLET | Freq: Four times a day (QID) | ORAL | 5 refills | Status: DC

## 2017-09-05 MED ORDER — SODIUM CHLORIDE 0.9 % IV BOLUS (SEPSIS)
1000.0000 mL | Freq: Once | INTRAVENOUS | Status: AC
  Administered 2017-09-05: 1000 mL via INTRAVENOUS

## 2017-09-05 MED ORDER — CEPHALEXIN 250 MG PO CAPS
500.0000 mg | ORAL_CAPSULE | Freq: Once | ORAL | Status: AC
  Administered 2017-09-06: 500 mg via ORAL
  Filled 2017-09-05: qty 2

## 2017-09-05 MED ORDER — IOPAMIDOL (ISOVUE-370) INJECTION 76%
50.0000 mL | Freq: Once | INTRAVENOUS | Status: AC | PRN
  Administered 2017-09-05: 50 mL via INTRAVENOUS

## 2017-09-05 MED ORDER — PROCHLORPERAZINE EDISYLATE 5 MG/ML IJ SOLN
5.0000 mg | Freq: Once | INTRAMUSCULAR | Status: AC
  Administered 2017-09-05: 5 mg via INTRAVENOUS
  Filled 2017-09-05: qty 2

## 2017-09-05 NOTE — Progress Notes (Signed)
Subjective:    Patient ID: [name] Barnett, female    DOB: [birth year], 41 y.o.   MRN: [id number]  HPI Vestibular training wit PT Fine motor with OT as well as oculovestibular retraining Has had follow-up with Pain Inventory Average Pain 5 Pain Right Now 0 My pain is intermittent, burning, tingling and aching  In the last 24 hours, has pain interfered with the following? General activity 1 Relation with others 1 Enjoyment of life 3 What TIME of day is your pain at its worst? varies Sleep (in general) Good  Pain is worse with: standing and some activites Pain improves with: medication Relief from Meds: 8  Mobility walk without assistance walk with assistance use a walker how many minutes can you walk? ? ability to climb steps?  yes do you drive?  no transfers alone  Function not employed: date last employed . I need assistance with the following:  bathing, toileting, meal prep, household duties and shopping  Neuro/Psych numbness tingling trouble walking  Prior Studies Any changes since last visit?  no  Physicians involved in your care Any changes since last visit?  no   Family History  Problem Relation Age of Onset  . Hypertension Mother   . Restless legs syndrome Mother   . Hyperlipidemia Mother   . Heart disease Father   . Malignant hyperthermia Neg Hx    Social History   Social History  . Marital status: Married    Spouse name: N/A  . Number of children: N/A  . Years of education: N/A   Social History Main Topics  . Smoking status: Former Smoker    Packs/day: 1.00    Years: 20.00    Types: Cigarettes    Quit date: 04/07/2017  . Smokeless tobacco: Never Used  . Alcohol use No  . Drug use: No  . Sexual activity: Not Currently    Partners: Male   Other Topics Concern  . None   Social History Narrative   Lives in [place] with husband and 2 kids. Works as a Psychologist, prison and probation services.   Past Surgical History:  Procedure Laterality Date  . ABDOMINAL  HYSTERECTOMY    . BALLOON DILATION  12/31/2011   Procedure: BALLOON DILATION;  Surgeon: Barrie Folk, MD;  Location[facility] ENDOSCOPY;  Service: Endoscopy;  Laterality: N/A;  . CHOLECYSTECTOMY  07/28/11  . ESOPHAGOGASTRODUODENOSCOPY  10/17/2011   Procedure: ESOPHAGOGASTRODUODENOSCOPY (EGD);  Surgeon: Barrie Folk, MD;  Location: [facility] ENDOSCOPY;  Service: Endoscopy;  Laterality: N/A;  . IR ANGIO INTRA EXTRACRAN SEL INTERNAL CAROTID BILAT MOD SED  04/07/2017  . IR ANGIO VERTEBRAL SEL VERTEBRAL UNI R MOD SED  04/07/2017  . IR ANGIOGRAM FOLLOW UP STUDY  04/07/2017  . IR ANGIOGRAM FOLLOW UP STUDY  04/07/2017  . IR ANGIOGRAM FOLLOW UP STUDY  04/07/2017  . IR ANGIOGRAM FOLLOW UP STUDY  04/07/2017  . IR ANGIOGRAM FOLLOW UP STUDY  04/07/2017  . IR ANGIOGRAM SELECTIVE EACH ADDITIONAL VESSEL  04/07/2017  . IR TRANSCATH/EMBOLIZ  04/07/2017  . PARS PLANA VITRECTOMY Right 05/01/2017   Procedure: PARS PLANA VITRECTOMY WITH 25 GAUGE RIGHT EYE, endolaser photocoaglation;  Surgeon: Carmela Rima, MD;  Location: [facility] OR;  Service: Ophthalmology;  Laterality: Right;  . RADIOLOGY WITH ANESTHESIA N/A 04/07/2017   Procedure: RADIOLOGY WITH ANESTHESIA;  Surgeon: Lisbeth Renshaw, MD;  Location: [facility] OR;  Service: Radiology;  Laterality: N/A;  . REPLACEMENT TOTAL KNEE BILATERAL  2004  . TEMPOROMANDIBULAR JOINT SURGERY     2 surgeries  .  TUMOR REMOVAL    . VAGOTOMY  01/23/2012   Procedure: VAGOTOMY, antrectomy and BII;  Surgeon: Currie Paris, MD;  Location: [facility] OR;  Service: General;  Laterality: N/A;  Laparotomy with vagotomy.   Past Medical History:  Diagnosis Date  . Duodenal obstruction   . GERD (gastroesophageal reflux disease)   . Hypertension    BP 131/74 (BP Location: Left Arm, Patient Position: Sitting, Barnett Size: Normal)   Pulse 73   SpO2 96%   Opioid Risk Score:   Fall Risk Score:  `1  Depression screen PHQ 2/9  Depression screen [facility] 2/9 08/01/2017 07/11/2017 05/14/2017  Decreased Interest 0 2 3  Down,  Depressed, Hopeless 0 1 2  PHQ - 2 Score 0 3 5  Altered sleeping 0 0 3  Tired, decreased energy Change in appetite 0 0 1  Feeling bad or failure about yourself  0 0 1  Trouble concentrating 0 1 2  Moving slowly or fidgety/restless 0 3 2  Suicidal thoughts 0 1 0  PHQ-9 Score Difficult doing work/chores - - Very difficult    Review of Systems  Constitutional: Negative.   Eyes: Negative.   Respiratory: Negative.   Cardiovascular: Negative.   Gastrointestinal: Negative.   Endocrine: Negative.   Genitourinary: Negative.   Musculoskeletal: Positive for gait problem.  Skin: Negative.   Allergic/Immunologic: Negative.   Neurological: Positive for numbness.       Tingling  Hematological: Negative.   Psychiatric/Behavioral: Negative.   All other systems reviewed and are negative.      Objective:   Physical Exam  Constitutional: She is oriented to person, place, and time. She appears well-developed and well-nourished.  HENT:  Head: Normocephalic and atraumatic.  Eyes: Pupils are equal, round, and reactive to light. Conjunctivae are normal. Right eye exhibits no discharge. Left eye exhibits no discharge. No scleral icterus.  Extraocular muscles are intact. However, she does have some blurring of vision but not frank diplopia when she looks toward the left  Visual fields are intact to confrontation testing No evidence of nystagmus   Neck: Normal range of motion. No JVD present.  No dizziness with alternating left and right head rotation  Cardiovascular:  No murmur heard. Pulmonary/Chest: Effort normal and breath sounds normal. No stridor.  Neurological: She is alert and oriented to person, place, and time. Coordination and gait abnormal.  Mild to moderate just dysmetria, left finger-nose-finger. Motor strength is 4/5 in the left deltoid, biceps, triceps, grip, hip flexion, knee extension, ankle dorsi flexion. Positive Romberg. However, when the patient slowly  brings her feet together and slowly brings her arms up and closes her eyes. She is able to maintain this for several seconds but needs close supervision. Gait is using a walker. She tends to lean toward the right side    Skin: Skin is warm and dry.  Nursing note and vitals reviewed.  Decreased pinprick sensation in the right upper and right lower limb       Assessment & Plan:  1. 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 Follow-up in 6 weeks

## 2017-09-05 NOTE — ED Notes (Signed)
ED Provider at bedside. 

## 2017-09-05 NOTE — ED Triage Notes (Signed)
Pt from home with ems as code stroke LSN 830pm tonight. Pt had sudden onset of left sided head and neck pain, pt has hx of aneurysm in April of this year. Pt very anxious upon arrival, alert and oriented. Hypertensive 160/100. HR 120 sinus. nad at this time

## 2017-09-05 NOTE — ED Notes (Signed)
After arriving in room from Ct, pt had multiple episodes of vomiting. EDP aware

## 2017-09-05 NOTE — Therapy (Signed)
[facility] [address] [place], [place], [address] Phone: [phone]   Fax:  [phone]  Occupational Therapy Treatment  Patient Details  Name: [name] Barnett MRN: [id number] Date of Birth: 10/06/1976 Referring Provider: Dr. Alysia Penna  Encounter Date: 09/04/2017      OT End of Session - 09/04/17 1459    Visit Number 11   Number of Visits 17   Date for OT Re-Evaluation 09/13/17   Authorization Type [employer], no visit limit/auth req.   OT Start Time 1452   OT Stop Time 1530   OT Time Calculation (min) 38 min   Activity Tolerance Patient tolerated treatment well   Behavior During Therapy WFL for tasks assessed/performed      Past Medical History:  Diagnosis Date  . Duodenal obstruction   . GERD (gastroesophageal reflux disease)   . Hypertension     Past Surgical History:  Procedure Laterality Date  . ABDOMINAL HYSTERECTOMY    . BALLOON DILATION  12/31/2011   Procedure: BALLOON DILATION;  Surgeon: Missy Sabins, MD;  Location: [facility] ENDOSCOPY;  Service: Endoscopy;  Laterality: N/A;  . CHOLECYSTECTOMY  07/28/11  . ESOPHAGOGASTRODUODENOSCOPY  10/17/2011   Procedure: ESOPHAGOGASTRODUODENOSCOPY (EGD);  Surgeon: Missy Sabins, MD;  Location: [facility] ENDOSCOPY;  Service: Endoscopy;  Laterality: N/A;  . IR ANGIO INTRA EXTRACRAN SEL INTERNAL CAROTID BILAT MOD SED  04/07/2017  . IR ANGIO VERTEBRAL SEL VERTEBRAL UNI R MOD SED  04/07/2017  . IR ANGIOGRAM FOLLOW UP STUDY  04/07/2017  . IR ANGIOGRAM FOLLOW UP STUDY  04/07/2017  . IR ANGIOGRAM FOLLOW UP STUDY  04/07/2017  . IR ANGIOGRAM FOLLOW UP STUDY  04/07/2017  . IR ANGIOGRAM FOLLOW UP STUDY  04/07/2017  . IR ANGIOGRAM SELECTIVE EACH ADDITIONAL VESSEL  04/07/2017  . IR TRANSCATH/EMBOLIZ  04/07/2017  . PARS PLANA VITRECTOMY Right 05/01/2017   Procedure: PARS PLANA VITRECTOMY WITH 25 GAUGE RIGHT EYE, endolaser photocoaglation;  Surgeon: Jalene Mullet, MD;  Location: [place];  Service:  Ophthalmology;  Laterality: Right;  . RADIOLOGY WITH ANESTHESIA N/A 04/07/2017   Procedure: RADIOLOGY WITH ANESTHESIA;  Surgeon: Consuella Lose, MD;  Location: [facility];  Service: Radiology;  Laterality: N/A;  . REPLACEMENT TOTAL KNEE BILATERAL  2004  . TEMPOROMANDIBULAR JOINT SURGERY     2 surgeries  . TUMOR REMOVAL    . VAGOTOMY  01/23/2012   Procedure: VAGOTOMY, antrectomy and BII;  Surgeon: Haywood Lasso, MD;  Location: [facility];  Service: General;  Laterality: N/A;  Laparotomy with vagotomy.    There were no vitals filed for this visit.      Subjective Assessment - 09/05/17 1751    Pertinent History Subarachnoid hemorrhage due to ruptured aneurysm, ataxia (L cerebellar); HTN, R side decr sensation, vertical diplopia/visual deficits, ataxia, Bilateral retinal hemorhage associated with SAH (with surgery on R eye in hospital and L approx 1 month ago)   Limitations visual deficits, fall risk, impulsivity/cognitive deficits   Patient Stated Goals be able to walk and use L arm   Currently in Pain? No/denies               Treatment: Tabletop scanning to peforrm large print word search, pt located all items in 7.5 mins wearing prisms.  Standing for dynamic functional reaching activities with LUE, placing large pegs into pegboard with min A for balance, and then placing/ removing graded clothespins from vertical antennae, min-mod A due to ataxia.  OT Short Term Goals - 08/22/17 1559      OT SHORT TERM GOAL #1   Title Pt/caregiver will be independent with initial HEP.--check STGs 08/14/17   Time 4   Period Weeks   Status Achieved  08/19/17:  with cueing from husband     OT Omega #2   Title Pt will be use LUE as nondominant assist at least 50% of the time for ADLs without cueing.   Time 4   Period Weeks   Status On-going  08/19/17:  approx 40% of the time     OT SHORT TERM GOAL #3   Title Pt will improve LUE coordination/control to improve  score on box and blocks test by at least 6 with LUE.   Baseline 19 blocks   Time 4   Period Weeks   Status On-going  08/22/17:  18, 20 blocks     OT SHORT TERM GOAL #4   Title Pt will perform simple home maintenance task/snack prep in sitting with set-up.   Time 4   Period Weeks   Status Achieved  08/19/17:  met in clinic with set-up and cueing/prompts for encouragement/to slow down     OT SHORT TERM GOAL #5   Title Pt/husband will verbalize understanding of visual compensation stategies for improved safety/incr ease with ADLs.   Time 4   Period Weeks   Status On-going           OT Long Term Goals - 07/16/17 1907      OT LONG TERM GOAL #1   Title Pt/caregiver will be independent with updated HEP.--check LTGs 09/13/17   Time 8   Period Weeks   Status New     OT LONG TERM GOAL #2   Title Pt will be use LUE as nondominant assist at least 75% of the time for ADLs without cueing.   Time 8   Period Weeks   Status New     OT LONG TERM GOAL #3   Title Pt will perform toileting mod I (including transfer).   Time 8   Period Weeks   Status New     OT LONG TERM GOAL #4   Title Pt will improve LUE coordination/control to improve score on box and blocks test by at least 12 with LUE.   Baseline 19 blocks   Time 8   Period Weeks   Status New     OT LONG TERM GOAL #5   Title Pt will perform simple home maintenance task/snack prep in standing with set-up/supervision.   Time 8   Period Weeks   Status New     OT LONG TERM GOAL #6   Title Pt will perform simple environmental scanning/navigation with supervision.   Time 8   Period Weeks   Status New               Plan - 09/05/17 1750    Clinical Impression Statement Pt is progressing towards goals with improving LUE functional use and visual skills.   Rehab Potential Good   Current Impairments/barriers affecting progress: cognitive deficits, impulsivity   OT Frequency 2x / week   OT Duration 8 weeks   OT  Treatment/Interventions Self-care/ADL training;DME and/or AE instruction;Patient/family education;Therapeutic exercises;Balance training;Fluidtherapy;Moist Heat;Ultrasound;Therapeutic exercise;Therapeutic activities;Cryotherapy;Neuromuscular education;Functional Mobility Training;Passive range of motion;Cognitive remediation/compensation;Visual/perceptual remediation/compensation;Manual Therapy   Plan NMR   OT Home Exercise Plan Education provided:  HEP for LUE control, coordination, and functional use   Consulted and Agree with Plan of  Care Patient;Family member/caregiver   Family Member Consulted husband      Patient will benefit from skilled therapeutic intervention in order to improve the following deficits and impairments:  Decreased coordination, Decreased range of motion, Difficulty walking, Abnormal gait, Decreased safety awareness, Impaired sensation, Decreased knowledge of precautions, Decreased balance, Decreased knowledge of use of DME, Impaired UE functional use, Decreased cognition, Decreased mobility, Decreased strength, Impaired vision/preception, Impaired perceived functional ability  Visit Diagnosis: Other lack of coordination  Visuospatial deficit  Other disturbances of skin sensation  Attention and concentration deficit    Problem List Patient Active Problem List   Diagnosis Date Noted  . Weakness with dizziness-  since [facility] and CVA 04/07/17 08/07/2017  . Disturbances of vision, late effect of stroke 08/06/2017  . Health education/counseling 08/05/2017  . High risk medications (not anticoagulants) long-term use 08/05/2017  . Neuritis-  R sided:  arm and leg/ body due to stroke 08/05/2017  . Elevated LDL cholesterol level 07/11/2017  . [employer] [facility] [employer] (NIH) Stroke Scale limb ataxia score 2, ataxia present in two limbs 06/25/2017  . 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 Elevated vitamin B12 level 05/30/2017  . Vitamin D  deficiency 05/29/2017  . History of tobacco abuse-  30pk yr hx - quit 04/07/17 05/21/2017  . Gait disturbance, post-stroke 05/14/2017  . Benign essential HTN   . Vitreous hemorrhage of right eye ([place])   . Adjustment disorder with mixed anxiety and depressed mood   . 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 s/p SAH (subarachnoid hemorrhage) ([place]) 04/19/2017  . 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 Hypoxia   . Subarachnoid hemorrhage due to ruptured aneurysm ([place]) 04/07/2017  . CVA (cerebral vascular accident) ([place]) 04/07/2017  . Elevated gastrin level 01/25/2012  . Hypokalemia 01/21/2012  . Nausea & vomiting 01/20/2012  . Epigastric pain 01/20/2012  . Duodenal ulcer, acute with obstruction 10/17/2011  . S/P laparoscopic cholecystectomy 10/16/2011    [name] Barnett 09/05/2017, 5:52 PM  [facility] [address] [place], [place], [address] Phone: [phone]   Fax:  [phone]  Name: [name] Barnett MRN: [id number] Date of Birth: August 24, 1976

## 2017-09-05 NOTE — ED Provider Notes (Addendum)
[facility]-EMERGENCY DEPT Provider Note   CSN: [id number] Arrival date & time: 09/05/17  2139     History   Chief Complaint Chief Complaint  Patient presents with  . Code Stroke    HPI [name] Barnett is a 41 y.o. female.  HPI   41 yo female presenting with thunderclap headache at 8:30 pm. Called code stroke. No neurologic symptoms.    In May of this year she underwent coiling of a basilar aneurysm. 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   Today she felt "chily" and started to have shakes.  Husband got worreidn and took her BP which read high because she was shaking. They called EMS.  She reports she started to "freak out" and then had a headahche. EMS called code stroke due to PMH.    Past Medical History:  Diagnosis Date  . Duodenal obstruction   . GERD (gastroesophageal reflux disease)   . Hypertension   . Stroke [facility])    april 2018    Patient Active Problem List   Diagnosis Date Noted  . Weakness with dizziness-  since [facility] and CVA 04/07/17 08/07/2017  . Disturbances of vision, late effect of stroke 08/06/2017  . Health education/counseling 08/05/2017  . High risk medications (not anticoagulants) long-term use 08/05/2017  . Neuritis-  R sided:  arm and leg/ body due to stroke 08/05/2017  . Elevated LDL cholesterol level 07/11/2017  . [employer] (NIH) Stroke Scale limb ataxia score 2, ataxia present in two limbs 06/25/2017  . 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 Elevated vitamin B12 level 05/30/2017  . Vitamin D deficiency 05/29/2017  . History of tobacco abuse-  30pk yr hx - quit 04/07/17 05/21/2017  . Gait disturbance, post-stroke 05/14/2017  . Benign essential HTN   . Vitreous hemorrhage of right eye ([name])   . Adjustment disorder with mixed anxiety and depressed mood   . 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 s/p SAH (subarachnoid hemorrhage) ([name]) 04/19/2017  . Basilar artery aneurysm ([name])   . Hypoxia   . 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 CVA (cerebral vascular accident) ([name]) 04/07/2017  . Elevated gastrin level 01/25/2012  . Hypokalemia 01/21/2012  . Nausea & vomiting 01/20/2012  . Epigastric pain 01/20/2012  . Duodenal ulcer, acute with obstruction 10/17/2011  . S/P laparoscopic cholecystectomy 10/16/2011    Past Surgical History:  Procedure Laterality Date  . ABDOMINAL HYSTERECTOMY    . BALLOON DILATION  12/31/2011   Procedure: BALLOON DILATION;  Surgeon: Barrie Folk, MD;  Location[facility] ENDOSCOPY;  Service: Endoscopy;  Laterality: N/A;  . CHOLECYSTECTOMY  07/28/11  . ESOPHAGOGASTRODUODENOSCOPY  10/17/2011   Procedure: ESOPHAGOGASTRODUODENOSCOPY (EGD);  Surgeon: Barrie Folk, MD;  Location: [facility] ENDOSCOPY;  Service: Endoscopy;  Laterality: N/A;  . IR ANGIO INTRA EXTRACRAN SEL INTERNAL CAROTID BILAT MOD SED  04/07/2017  . IR ANGIO VERTEBRAL SEL VERTEBRAL UNI R MOD SED  04/07/2017  . IR ANGIOGRAM FOLLOW UP STUDY  04/07/2017  . IR ANGIOGRAM FOLLOW UP STUDY  04/07/2017  . IR ANGIOGRAM FOLLOW UP STUDY  04/07/2017  . IR ANGIOGRAM FOLLOW UP STUDY  04/07/2017  . IR ANGIOGRAM FOLLOW UP STUDY  04/07/2017  . IR ANGIOGRAM SELECTIVE EACH ADDITIONAL VESSEL  04/07/2017  . IR TRANSCATH/EMBOLIZ  04/07/2017  .  PARS PLANA VITRECTOMY Right 05/01/2017   Procedure: PARS PLANA VITRECTOMY WITH 25 GAUGE RIGHT EYE, endolaser photocoaglation;  Surgeon: Carmela Rima, MD;  Location: [facility] OR;  Service: Ophthalmology;  Laterality: Right;  . RADIOLOGY WITH ANESTHESIA N/A 04/07/2017   Procedure: RADIOLOGY WITH ANESTHESIA;  Surgeon: Lisbeth Renshaw, MD;  Location: [facility] OR;  Service: Radiology;  Laterality: N/A;  . REPLACEMENT TOTAL KNEE BILATERAL  2004  . TEMPOROMANDIBULAR JOINT SURGERY     2 surgeries  . TUMOR REMOVAL    . VAGOTOMY  01/23/2012    Procedure: VAGOTOMY, antrectomy and BII;  Surgeon: Currie Paris, MD;  Location: [facility] OR;  Service: General;  Laterality: N/A;  Laparotomy with vagotomy.    OB History    No data available       Home Medications    Prior to Admission medications   Medication Sig Start Date End Date Taking? Authorizing Provider  amLODipine (NORVASC) 2.5 MG tablet Take 1 tablet (2.5 mg total) by mouth daily. 08/01/17  Yes Opalski, Gavin Pound, DO  atorvastatin (LIPITOR) 20 MG tablet Take 1 tablet (20 mg total) by mouth at bedtime. 07/11/17  Yes Opalski, Gavin Pound, DO  FLUoxetine (PROZAC) 20 MG capsule Take 2 capsules (40 mg total) by mouth daily. 08/01/17  Yes Opalski, Gavin Pound, DO  gabapentin (NEURONTIN) 600 MG tablet Take 1 tablet (600 mg total) by mouth 4 (four) times daily. 09/05/17  Yes Kirsteins, Victorino Sparrow, MD  valsartan (DIOVAN) 40 MG tablet Take 0.5 tablets (20 mg total) by mouth daily. 08/01/17  Yes Opalski, Gavin Pound, DO  cephALEXin (KEFLEX) 500 MG capsule Take 1 capsule (500 mg total) by mouth 4 (four) times daily. 09/06/17   Mannie Ohlin Lyn, MD  Cholecalciferol (VITAMIN D3) 5000 units TABS 5,000 IU OTC vitamin D3 daily. Patient not taking: Reported on 09/05/2017 07/11/17   Thomasene Lot, DO  Vitamin D, Ergocalciferol, (DRISDOL) 50000 units CAPS capsule Take 1 capsule (50,000 Units total) by mouth every 7 (seven) days. Patient not taking: Reported on 09/05/2017 05/29/17   Thomasene Lot, DO    Family History Family History  Problem Relation Age of Onset  . Hypertension Mother   . Restless legs syndrome Mother   . Hyperlipidemia Mother   . Heart disease Father   . Malignant hyperthermia Neg Hx     Social History Social History  Substance Use Topics  . Smoking status: Former Smoker    Packs/day: 1.00    Years: 20.00    Types: Cigarettes    Quit date: 04/07/2017  . Smokeless tobacco: Never Used  . Alcohol use No     Allergies   Nsaids   Review of Systems Review of Systems    Constitutional: Negative for activity change.  Respiratory: Negative for shortness of breath.   Cardiovascular: Negative for chest pain.  Gastrointestinal: Negative for abdominal pain.  Neurological: Positive for headaches.     Physical Exam Updated Vital Signs BP 117/70   Pulse (!) 103   Temp 98.6 F (37 C)   Resp (!) 28   Wt 73.5 kg (162 lb)   SpO2 97%   BMI 28.47 kg/m   Physical Exam  Constitutional: She appears well-developed and well-nourished.  HENT:  Head: Normocephalic and atraumatic.  Eyes: Right eye exhibits no discharge.  Cardiovascular: Normal rate.   Pulmonary/Chest: Effort normal and breath sounds normal. No respiratory distress.  Neurological:  Patient can't feel R side of body, L side she reports moves uncontrollably.  She denies any neurologic changes. A/o X 3,  moving all extremities with no evidence of cranial nerve palsy.   Skin: Skin is warm and dry. She is not diaphoretic.  Psychiatric: She has a normal mood and affect.  Nursing note and vitals reviewed.    ED Treatments / Results  Labs (all labs ordered are listed, but only abnormal results are displayed) Labs Reviewed  CBC - Abnormal; Notable for the following:       Result Value   Hemoglobin 11.5 (*)    RDW 20.2 (*)    All other components within normal limits  DIFFERENTIAL - Abnormal; Notable for the following:    Monocytes Absolute 0.0 (*)    All other components within normal limits  COMPREHENSIVE METABOLIC PANEL - Abnormal; Notable for the following:    CO2 20 (*)    Glucose, Bld 109 (*)    Calcium 8.7 (*)    Total Protein 6.3 (*)    All other components within normal limits  URINALYSIS, ROUTINE W REFLEX MICROSCOPIC - Abnormal; Notable for the following:    APPearance HAZY (*)    Hgb urine dipstick SMALL (*)    Protein, ur 30 (*)    Nitrite POSITIVE (*)    Leukocytes, UA SMALL (*)    Bacteria, UA FEW (*)    Squamous Epithelial / LPF 0-5 (*)    All other components within  normal limits  CBG MONITORING, ED - Abnormal; Notable for the following:    Glucose-Capillary 105 (*)    All other components within normal limits  I-STAT CHEM 8, ED - Abnormal; Notable for the following:    Glucose, Bld 109 (*)    Calcium, Ion 1.06 (*)    All other components within normal limits  PROTIME-INR  APTT  I-STAT TROPONIN, ED  I-STAT CG4 LACTIC ACID, ED    EKG  EKG Interpretation None       Radiology Ct Angio Head W Or Wo Contrast  Result Date: 09/05/2017 CLINICAL DATA:  Sudden onset headache EXAM: CT ANGIOGRAPHY HEAD TECHNIQUE: Multidetector CT imaging of the head was performed using the standard protocol during bolus administration of intravenous contrast. Multiplanar CT image reconstructions and MIPs were obtained to evaluate the vascular anatomy. CONTRAST:  50 mL Isovue 370 COMPARISON:  CTA head and neck 04/07/2017 FINDINGS: CTA HEAD Anterior circulation: --Intracranial internal carotid arteries: Normal. --Anterior cerebral arteries: Normal. --Middle cerebral arteries: Normal. --Posterior communicating arteries: Absent bilaterally. Posterior circulation: --Posterior cerebral arteries: Normal. --Superior cerebellar arteries: Normal. --Basilar artery: Status post coiling of basilar tip aneurysm. Streak artifact from the coil mass obscures the adjacent structures. No residual aneurysm is visible. --Anterior inferior cerebellar arteries: Normal. --Posterior inferior cerebellar arteries: Normal. --Vertebral arteries:  Right-dominant.  Normal. Venous sinuses: As permitted by contrast timing, patent. Anatomic variants: None Delayed phase: No abnormal intracranial enhancement. IMPRESSION: 1. No acute intracranial abnormality. 2. Status post coil embolization of leftward projecting basilar artery tip aneurysm. Streak artifact from the coil mass obscure surrounding structures but no residual aneurysm filling is visible. 3. No other aneurysm or emergent large vessel occlusion.  Electronically Signed   By: Deatra Robinson M.D.   On: 09/05/2017 22:53   Ct Head Code Stroke Wo Contrast  Result Date: 09/05/2017 CLINICAL DATA:  Code stroke. Acute onset headache. Last seen normal at 20 30 hours. History of hypertension, ruptured basilar tip aneurysm. EXAM: CT HEAD WITHOUT CONTRAST TECHNIQUE: Contiguous axial images were obtained from the base of the skull through the vertex without intravenous contrast. COMPARISON:  CT HEAD Apr 17, 2017 FINDINGS: BRAIN: No intraparenchymal hemorrhage, mass effect nor midline shift. No acute large vascular territory infarct. Old LEFT superior cerebellar artery territory infarct. RIGHT frontal ventriculostomy catheter tract. The ventricles and sulci are normal. No acute large vascular territory infarcts. No abnormal extra-axial fluid collections. Basal cisterns are patent. VASCULAR: Status post coil embolization of basilar tip aneurysm toward the RIGHT, with streak artifact. Trace calcific atherosclerosis carotid siphons. SKULL/SOFT TISSUES: No skull fracture. RIGHT frontal burr hole. Diminutive mandible condyles, potentially degenerative. No significant soft tissue swelling. ORBITS/SINUSES: The included ocular globes and orbital contents are normal.The mastoid aircells and included paranasal sinuses are well-aerated. OTHER: None. ASPECTS [facility] Stroke Program Early CT Score) - Ganglionic level infarction (caudate, lentiform nuclei, internal capsule, insula, M1-M3 cortex): 7 - Supraganglionic infarction (M4-M6 cortex): 3 Total score (0-10 with 10 being normal): 10 IMPRESSION: 1. No acute intracranial process. 2. Old LEFT SCA territory infarct, status post basilar tip aneurysm coil embolization. 3. ASPECTS is 10. 4. Critical Value/emergent results were called by telephone at the time of interpretation on 09/05/2017 at 10:00 pm to Dr. Laurence Slate, Neurology, who verbally acknowledged these results. Electronically Signed   By: Awilda Metro M.D.   On: 09/05/2017 22:09     Procedures Procedures (including critical care time)  Medications Ordered in ED Medications  iopamidol (ISOVUE-370) 76 % injection 50 mL (50 mLs Intravenous Contrast Given 09/05/17 2207)  sodium chloride 0.9 % bolus 1,000 mL (0 mLs Intravenous Stopped 09/06/17 0028)  prochlorperazine (COMPAZINE) injection 5 mg (5 mg Intravenous Given 09/05/17 2335)  diphenhydrAMINE (BENADRYL) injection 25 mg (25 mg Intravenous Given 09/05/17 2333)  cephALEXin (KEFLEX) capsule 500 mg (500 mg Oral Given 09/06/17 0032)     Initial Impression / Assessment and Plan / ED Course  I have reviewed the triage vital signs and the nursing notes.  Pertinent labs & imaging results that were available during my care of the patient were reviewed by me and considered in my medical decision making (see chart for details).     Patient is a 41 year old female presenting with headache. Patient reports that this headache is different than her headache last time. She reports that the time that she had his headache she had high blood pressure and got very worried and anxious. She reports she "freaked out". She feels much more calm now. CT shows no evidence of bleeding CT angio shows no evidence of bleeding and in the coil is in place.  11:37 PM Discussed with neurosurgery Cala Bradford. They will review images and advise  Dr. Yetta Barre called and we discussed this case. He reports the coil looks in place and would not pursue any further imaging at this time.  Discussed with patient the fact that the imaging is normal within 6 hours of onset of headache that current literature reflects that at least 92-96% of the time is accurate. There is no evidence of aneurysmal bleed. Discussed the LP would be required to make 100%. Patient and husband do not wamt LP at this time.  Again patient feels completely improved after relaxing, improvement of blood pressure.  Even before any medicaitons, patient reports she is back to baseline.  We'll give  migraine cocktail.  Patient had have UTI.  12:39 AM Patient would like to return home. I'm unsure what happened however with the UTI this could've been a reaction to infection. Or a food borne illness that improved after vomiting.   We will treat UTI with Keflex. Long discussion had with patient and family member  about admission and observation vs discharge home.  Since there is no active intervention that requires admission, they would like to return home.  Final Clinical Impressions(s) / ED Diagnoses   Final diagnoses:  Acute cystitis without hematuria    New Prescriptions New Prescriptions   CEPHALEXIN (KEFLEX) 500 MG CAPSULE    Take 1 capsule (500 mg total) by mouth 4 (four) times daily.     Abelino Derrick, MD 09/06/17 0044    Abelino Derrick, MD 09/06/17 [id number]

## 2017-09-05 NOTE — Consult Note (Signed)
Neurology Consultation  Referring Physician: Dr Corlis Leak  CC: Sudden onset headache  History is obtained from: Patient chart review  HPI: [name] Barnett is a 41 y.o. 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 EMS arrived at the scene and the blood pressure was 200 systolic. She was stroke alerted and brought to [facility] emergency room. A stat CT head showed no evidence of acute hemorrhage . CTA head was performed and redemonstrated coiled left PCA aneurysm with post coil filling being normal.    ROS: A 14 point ROS was performed and is negative except as noted in the HPI.  Past Medical History:  Diagnosis Date  . Duodenal obstruction   . GERD (gastroesophageal reflux disease)   . Hypertension   . Stroke [facility])    april 2018     Family History  Problem Relation Age of Onset  . Hypertension Mother   . Restless legs syndrome Mother   . Hyperlipidemia Mother   . Heart disease Father   . Malignant hyperthermia Neg Hx     Social History:  reports that she quit smoking about 4 months ago. Her smoking use included Cigarettes. She has a 20.00 pack-year smoking history. She has never used smokeless tobacco. She reports that she does not drink alcohol or use drugs.   Exam: Current vital signs: BP 135/63   Pulse (!) 114   Temp 98.6 F (37 C)   Resp (!) 27   Wt 73.5 kg (162 lb)   SpO2 97%   BMI 28.47 kg/m  Vital signs in last 24 hours: Temp:  [98.6 F (37 C)-99.7 F (37.6 C)] 98.6 F (37 C) (09/27 2321) Pulse Rate:  [73-126] 114 (09/27 2300) Resp:  [16-27] 27 (09/27 2300) BP: (131-158)/(63-80) 135/63 (09/27 2300) SpO2:  [96 %-97 %] 97 % (09/27 2300) Weight:  [73.5 kg (162 lb)] 73.5 kg (162 lb) (09/27 2211)   Physical Exam  Constitutional:  Appears well-developed and well-nourished.  Psych: Affect appropriate to situation Eyes: No scleral injection HENT: No OP obstrucion Head: Normocephalic.  Cardiovascular: Normal rate and regular rhythm.  Respiratory: Effort normal and breath sounds normal to anterior ascultation GI: Soft.  No distension. There is no tenderness.  Skin: WDI  Neuro: Mental Status: Patient is awake, alert, oriented to person, place, month, year, and situation. Patient is able to give a clear and coherent history. No signs of aphasia or neglect Cranial Nerves: II: Visual Fields are full. Pupils are equal, round, and reactive to light.  III,IV, VI: EOMI without ptosis or diploplia.  V: Facial sensation is symmetric to temperature VII: Facial movement is symmetric.  VIII: hearing is intact to voice X: Uvula elevates symmetrically XI: Shoulder shrug is symmetric. XII: tongue is midline without atrophy or fasciculations.  Motor: Tone is normal. Bulk is normal. 5/5 strength was present in all four extremities. Sensory: Reduced over right face arm and leg Deep Tendon Reflexes: 2+ and symmetric in the biceps and patellae.  Plantars: Toes are downgoing bilaterally.  Cerebellar: Finger to nose and heel to shin ataxia on the left side   ASSESSMENT AND PLAN  [name] Barnett is a 41 y.o. 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 Blima Ledger subsequent left SCA ischemic stroke with  residual left sided ataxia and right hemisensory loss.,  hypertension who presents with sudden onset shivering and thunderclap headache. History is not suggestive of acute ischemic stroke and CT head ruled out acute hemorrhage. Stroke alert was cancelled. Given sudden onset headache may be worthwhile to pursue studies to look at the patency of the coil- CTA is not the best study for this. Recommend neurosurgery input for this.  Follow up on plan- headache apparently more gradual in  onset, now better. ER physician discussed case with NS, felt coil was intact and needed no further imaging ( such as MRI w/wo contrast to look for contrast extravasation to check patency of coil). Patient was discharged from ER with diagnosis of UTI.

## 2017-09-05 NOTE — Patient Instructions (Signed)
Expect some further improvement with vision, sensation and balance

## 2017-09-06 MED ORDER — CEPHALEXIN 500 MG PO CAPS: 500.0000 mg | ORAL_CAPSULE | Freq: Four times a day (QID) | ORAL | 0 refills | Status: DC

## 2017-09-06 NOTE — ED Notes (Signed)
ED Provider at bedside. 

## 2017-09-06 NOTE — Discharge Instructions (Signed)
Please follow up with the primary care physician. Please  return with any headache, or other symptoms.

## 2017-09-06 NOTE — ED Notes (Signed)
Pt verbalized understanding of d.c instructions, no further questions, pt a.o and ambulatory upon d.c, nad

## 2017-09-11 ENCOUNTER — Ambulatory Visit: Payer: BLUE CROSS/BLUE SHIELD | Admitting: Physical Therapy

## 2017-09-11 ENCOUNTER — Ambulatory Visit: Payer: BLUE CROSS/BLUE SHIELD | Attending: Physical Medicine & Rehabilitation | Admitting: Occupational Therapy

## 2017-09-11 ENCOUNTER — Ambulatory Visit: Payer: BLUE CROSS/BLUE SHIELD | Admitting: Family Medicine

## 2017-09-11 VITALS — BP 101/68 | HR 82

## 2017-09-11 DIAGNOSIS — R278 Other lack of coordination: Secondary | ICD-10-CM | POA: Diagnosis not present

## 2017-09-11 DIAGNOSIS — I69018 Other symptoms and signs involving cognitive functions following nontraumatic subarachnoid hemorrhage: Secondary | ICD-10-CM | POA: Diagnosis present

## 2017-09-11 DIAGNOSIS — R414 Neurologic neglect syndrome: Secondary | ICD-10-CM | POA: Insufficient documentation

## 2017-09-11 DIAGNOSIS — R2681 Unsteadiness on feet: Secondary | ICD-10-CM | POA: Insufficient documentation

## 2017-09-11 DIAGNOSIS — R208 Other disturbances of skin sensation: Secondary | ICD-10-CM | POA: Diagnosis present

## 2017-09-11 DIAGNOSIS — R41840 Attention and concentration deficit: Secondary | ICD-10-CM | POA: Insufficient documentation

## 2017-09-11 DIAGNOSIS — R270 Ataxia, unspecified: Secondary | ICD-10-CM | POA: Insufficient documentation

## 2017-09-11 DIAGNOSIS — R42 Dizziness and giddiness: Secondary | ICD-10-CM

## 2017-09-11 DIAGNOSIS — I69054 Hemiplegia and hemiparesis following nontraumatic subarachnoid hemorrhage affecting left non-dominant side: Secondary | ICD-10-CM | POA: Insufficient documentation

## 2017-09-11 DIAGNOSIS — R260 Ataxic gait: Secondary | ICD-10-CM | POA: Insufficient documentation

## 2017-09-11 DIAGNOSIS — R2689 Other abnormalities of gait and mobility: Secondary | ICD-10-CM | POA: Insufficient documentation

## 2017-09-11 DIAGNOSIS — R41842 Visuospatial deficit: Secondary | ICD-10-CM | POA: Diagnosis present

## 2017-09-11 NOTE — Therapy (Signed)
[facility] [address] [place], [place], [address] Phone: [phone]   Fax:  [phone]  Physical Therapy Treatment  Patient Details  Name: [name] Barnett MRN: [id number] Date of Birth: 28-Oct-1976 Referring Provider: Charlett Blake, MD  Encounter Date: 09/11/2017      PT End of Session - 09/11/17 2306    Visit Number 13   Number of Visits 17   Date for PT Re-Evaluation 09/13/17   Authorization Type BCBS   PT Start Time 5427   PT Stop Time 1620   PT Time Calculation (min) 47 min   Equipment Utilized During Treatment Gait belt      Past Medical History:  Diagnosis Date  . Duodenal obstruction   . GERD (gastroesophageal reflux disease)   . Hypertension   . Stroke [facility])    april 2018    Past Surgical History:  Procedure Laterality Date  . ABDOMINAL HYSTERECTOMY    . BALLOON DILATION  12/31/2011   Procedure: BALLOON DILATION;  Surgeon: Missy Sabins, MD;  Location: [facility] ENDOSCOPY;  Service: Endoscopy;  Laterality: N/A;  . CHOLECYSTECTOMY  07/28/11  . ESOPHAGOGASTRODUODENOSCOPY  10/17/2011   Procedure: ESOPHAGOGASTRODUODENOSCOPY (EGD);  Surgeon: Missy Sabins, MD;  Location: [facility] ENDOSCOPY;  Service: Endoscopy;  Laterality: N/A;  . IR ANGIO INTRA EXTRACRAN SEL INTERNAL CAROTID BILAT MOD SED  04/07/2017  . IR ANGIO VERTEBRAL SEL VERTEBRAL UNI R MOD SED  04/07/2017  . IR ANGIOGRAM FOLLOW UP STUDY  04/07/2017  . IR ANGIOGRAM FOLLOW UP STUDY  04/07/2017  . IR ANGIOGRAM FOLLOW UP STUDY  04/07/2017  . IR ANGIOGRAM FOLLOW UP STUDY  04/07/2017  . IR ANGIOGRAM FOLLOW UP STUDY  04/07/2017  . IR ANGIOGRAM SELECTIVE EACH ADDITIONAL VESSEL  04/07/2017  . IR TRANSCATH/EMBOLIZ  04/07/2017  . PARS PLANA VITRECTOMY Right 05/01/2017   Procedure: PARS PLANA VITRECTOMY WITH 25 GAUGE RIGHT EYE, endolaser photocoaglation;  Surgeon: Jalene Mullet, MD;  Location: [facility];  Service: Ophthalmology;  Laterality: Right;  . RADIOLOGY WITH  ANESTHESIA N/A 04/07/2017   Procedure: RADIOLOGY WITH ANESTHESIA;  Surgeon: Consuella Lose, MD;  Location: [facility];  Service: Radiology;  Laterality: N/A;  . REPLACEMENT TOTAL KNEE BILATERAL  2004  . TEMPOROMANDIBULAR JOINT SURGERY     2 surgeries  . TUMOR REMOVAL    . VAGOTOMY  01/23/2012   Procedure: VAGOTOMY, antrectomy and BII;  Surgeon: Haywood Lasso, MD;  Location: [facility];  Service: General;  Laterality: N/A;  Laparotomy with vagotomy.    Vitals:   09/11/17 1535  BP: 101/68  Pulse: 82        Subjective Assessment - 09/11/17 2252    Subjective Pt reports she went to ED last Thursday - had a UTI - states she is feeling much better now   Patient is accompained by: Family member   Limitations Standing;Walking   Currently in Pain? No/denies                         [facility] Adult PT Treatment/Exercise - 09/11/17 1617      Ambulation/Gait   Ambulation/Gait Yes   Ambulation/Gait Assistance 4: Min guard   Ambulation Distance (Feet) 100 Feet   Assistive device Rolling walker   Gait Pattern Step-through pattern;Ataxic   Ambulation Surface Level;Indoor   Gait velocity 19.38 = 1.69 ft/sec with RW  2nd trial 18.44 = 1.78 ft/sec with RW and CGA     Neuro Re-ed  Neuro Re-ed Details  Pt transferred from standing to tall kneeling position on mat on floor; worked on trunk stabilization with UE support prn with weight shifts right to left ; moved RLE up/back with cues for smal movement and decr. speed 5 reps and then 5 reps moving LLE up/back; pt amb. on knees on blue mat on lfoor with min to mod assist for balance; amb. backwards on knees with mod assist 1 rep each direction;  worked on left knee control at steps - [employer] initially to prevent Lt knee hyperextension with pt slowly tapping RLE onto step with RUE support     Knee/Hip Exercises: Standing   Forward Step Up Left;1 set;10 reps  mod assist for control of genu recurvatum   Other Standing Knee Exercises  partial squats at mat 10 reps without UE support     Pt performed x1 viewing in standing (horizontally) 60 secs - with wall behind her and RW in front for safety;  Pt reported  No dizziness upon completion of exercise   Pt performed slow tap ups with RLE onto 1 step for improved Lt SLS and for Lt knee control with tactile cues to prevent recurvatum        PT Education - 09/11/17 2305    Education provided Yes   Education Details recommended to pt and husband that pt do tap ups at home to work on balance and coordination of each LE as well as Lt knee control   Person(s) Educated Patient;Spouse   Methods Explanation   Comprehension Verbalized understanding;Returned demonstration          PT Short Term Goals - 08/16/17 1411      PT SHORT TERM GOAL #1   Title Pt will participate in vestibular assessment and assessment of gait velocity with LTG to be set    Time 4   Period Weeks   Status Achieved     PT SHORT TERM GOAL #2   Title Pt will improve balance and midline orientation to perform sit <> stand with supervision and be able to maintain static standing x 2 minutes without UE support and no LOB to R   Baseline met 08/16/17   Time 4   Period Weeks   Status Achieved     PT SHORT TERM GOAL #3   Title Pt will decrease falls risk as indicated by increase in BERG balance score to > or = 22/56   Baseline 17/56 to 22/56 on 08/16/17   Time 4   Period Weeks   Status Achieved     PT SHORT TERM GOAL #4   Title Pt will ambulate x 150' on indoor surfaces with LRAD and min A and require <10% assistance to maintain AD in straight path and <10% verbal cues for gait pattern.   Baseline partially met, felt that she needed mod cues for safe gait pattern.    Time 4   Period Weeks   Status Partially Met           PT Long Term Goals - 09/11/17 2256      PT LONG TERM GOAL #1   Title Pt and husband will demonstrate independence with HEP   Baseline Pt reports not doing exercises on a  consistent basis - 09-11-17   Status Not Met     PT LONG TERM GOAL #2   Title Pt will demonstrate improved balance and decreased falls risk as indicated by BERG balance score of > or = 27/56  Status New     PT LONG TERM GOAL #3   Title Pt will ambulate with LRAD x 300' over level indoor and paved outdoor surfaces with supervision and intermittent cues for gait sequence   Status New     PT LONG TERM GOAL #4   Title Pt will report 15% improvement in Neuro QOL-LE   Status New     PT LONG TERM GOAL #5   Title Pt will decrease falls risk during gait in home/community as indicated by increase in gait velocity to > or = 1.46 ft/sec with LRAD   Baseline 19.38, 18.44 = 1.69 ft/sec / 1.78 ft/sec with RW   Status Achieved     PT LONG TERM GOAL #6   Title Pt will report <2/10 dizziness with head movements and will demonstrate improved gaze stability as indicated by ability to perform x 1 viewing HEP x 60 seconds in each direction   Baseline Pt reports very minimal dizziness (1/10) with x1 viewing in standing - 09-11-17   Status Achieved               Plan - 09/11/17 2307    Clinical Impression Statement Pt reported no dizziness with x1 viewing exercise in standing, however, speed was slower at times with decr. ROM noted at times during the 60 sec performance of this exercise.  Pt met LTG #5 and 6:  #1 not met due to pt reporting not doing HEP consistently.  Pt reported fatigue at end of today's session, statiing that her legs were extremely fatgiued.   Rehab Potential Good   PT Frequency 2x / week   PT Duration 8 weeks   PT Next Visit Plan Finish checking LTG's for renewal; cont balance and gait and trunk stabilization exercises   Consulted and Agree with Plan of Care Patient;Family member/caregiver   Family Member Consulted husband      Patient will benefit from skilled therapeutic intervention in order to improve the following deficits and impairments:  Abnormal gait, Decreased  balance, Decreased cognition, Decreased coordination, Decreased strength, Difficulty walking, Dizziness, Impaired sensation, Impaired vision/preception, Pain  Visit Diagnosis: Other abnormalities of gait and mobility  Other lack of coordination  Unsteadiness on feet     Problem List Patient Active Problem List   Diagnosis Date Noted  . Weakness with dizziness-  since [facility] and CVA 04/07/17 08/07/2017  . Disturbances of vision, late effect of stroke 08/06/2017  . Health education/counseling 08/05/2017  . High risk medications (not anticoagulants) long-term use 08/05/2017  . Neuritis-  R sided:  arm and leg/ body due to stroke 08/05/2017  . Elevated LDL cholesterol level 07/11/2017  . [employer] (NIH) Stroke Scale limb ataxia score 2, ataxia present in two limbs 06/25/2017  . 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 Elevated vitamin B12 level 05/30/2017  . Vitamin D deficiency 05/29/2017  . History of tobacco abuse-  30pk yr hx - quit 04/07/17 05/21/2017  . Gait disturbance, post-stroke 05/14/2017  . Benign essential HTN   . Vitreous hemorrhage of right eye ([place])   . Adjustment disorder with mixed anxiety and depressed mood   . 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 s/p SAH (subarachnoid hemorrhage) ([place]) 04/19/2017  . 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 Hypoxia   . Subarachnoid hemorrhage due to ruptured aneurysm ([place]) 04/07/2017  . CVA (cerebral vascular accident) ([place]) 04/07/2017  .  Elevated gastrin level 01/25/2012  . Hypokalemia 01/21/2012  . Nausea & vomiting 01/20/2012  . Epigastric pain 01/20/2012  . Duodenal ulcer, acute with obstruction 10/17/2011  . S/P laparoscopic cholecystectomy 10/16/2011    Alda Lea, PT 09/11/2017, 11:13 PM  [facility] [address] [place], [place],  [address] Phone: [phone]   Fax:  [phone]  Name: LEEN TWOREK MRN: [id number] Date of Birth: December 19, 1975

## 2017-09-11 NOTE — Therapy (Signed)
[facility] [address] [place], [place], [address] Phone: [phone]   Fax:  [phone]  Occupational Therapy Treatment  Patient Details  Name: [name] Barnett MRN: [id number] Date of Birth: 12-11-1975 Referring Provider: Dr. Alysia Penna  Encounter Date: 09/11/2017      OT End of Session - 09/11/17 1711    Visit Number 11   Number of Visits 17   Date for OT Re-Evaluation 09/19/17  extended due to missed visits   Authorization Type BCBS, no visit limit/auth req.   OT Start Time 1455   OT Stop Time 1533   OT Time Calculation (min) 38 min   Activity Tolerance Patient tolerated treatment well   Behavior During Therapy WFL for tasks assessed/performed      Past Medical History:  Diagnosis Date  . Duodenal obstruction   . GERD (gastroesophageal reflux disease)   . Hypertension   . Stroke [facility])    april 2018    Past Surgical History:  Procedure Laterality Date  . ABDOMINAL HYSTERECTOMY    . BALLOON DILATION  12/31/2011   Procedure: BALLOON DILATION;  Surgeon: Missy Sabins, MD;  Location[facility] ENDOSCOPY;  Service: Endoscopy;  Laterality: N/A;  . CHOLECYSTECTOMY  07/28/11  . ESOPHAGOGASTRODUODENOSCOPY  10/17/2011   Procedure: ESOPHAGOGASTRODUODENOSCOPY (EGD);  Surgeon: Missy Sabins, MD;  Location: [facility] ENDOSCOPY;  Service: Endoscopy;  Laterality: N/A;  . IR ANGIO INTRA EXTRACRAN SEL INTERNAL CAROTID BILAT MOD SED  04/07/2017  . IR ANGIO VERTEBRAL SEL VERTEBRAL UNI R MOD SED  04/07/2017  . IR ANGIOGRAM FOLLOW UP STUDY  04/07/2017  . IR ANGIOGRAM FOLLOW UP STUDY  04/07/2017  . IR ANGIOGRAM FOLLOW UP STUDY  04/07/2017  . IR ANGIOGRAM FOLLOW UP STUDY  04/07/2017  . IR ANGIOGRAM FOLLOW UP STUDY  04/07/2017  . IR ANGIOGRAM SELECTIVE EACH ADDITIONAL VESSEL  04/07/2017  . IR TRANSCATH/EMBOLIZ  04/07/2017  . PARS PLANA VITRECTOMY Right 05/01/2017   Procedure: PARS PLANA VITRECTOMY WITH 25 GAUGE RIGHT EYE, endolaser  photocoaglation;  Surgeon: Jalene Mullet, MD;  Location: [facility];  Service: Ophthalmology;  Laterality: Right;  . RADIOLOGY WITH ANESTHESIA N/A 04/07/2017   Procedure: RADIOLOGY WITH ANESTHESIA;  Surgeon: Consuella Lose, MD;  Location: [facility];  Service: Radiology;  Laterality: N/A;  . REPLACEMENT TOTAL KNEE BILATERAL  2004  . TEMPOROMANDIBULAR JOINT SURGERY     2 surgeries  . TUMOR REMOVAL    . VAGOTOMY  01/23/2012   Procedure: VAGOTOMY, antrectomy and BII;  Surgeon: [place] Lasso, MD;  Location: [facility];  Service: General;  Laterality: N/A;  Laparotomy with vagotomy.    There were no vitals filed for this visit.        Treatment: seated flipping and dealing cards with LUE, min-mod difficulty/ v.c Stacking 1 inch blocks for increased fine motor coordination, mod difficulty Standing to remove large pegs from pegboard then place into container on lower surface, min facilitation, min mod difficulty Arm bike x 3.5 mins for reciprocal motion.                        OT Short Term Goals - 08/22/17 1559      OT SHORT TERM GOAL #1   Title Pt/caregiver will be independent with initial HEP.--check STGs 08/14/17   Time 4   Period Weeks   Status Achieved  08/19/17:  with cueing from husband     OT [place] #2   Title  Pt will be use LUE as nondominant assist at least 50% of the time for ADLs without cueing.   Time 4   Period Weeks   Status On-going  08/19/17:  approx 40% of the time     OT SHORT TERM GOAL #3   Title Pt will improve LUE coordination/control to improve score on box and blocks test by at least 6 with LUE.   Baseline 19 blocks   Time 4   Period Weeks   Status On-going  08/22/17:  18, 20 blocks     OT SHORT TERM GOAL #4   Title Pt will perform simple home maintenance task/snack prep in sitting with set-up.   Time 4   Period Weeks   Status Achieved  08/19/17:  met in clinic with set-up and cueing/prompts for encouragement/to slow down     OT  SHORT TERM GOAL #5   Title Pt/husband will verbalize understanding of visual compensation stategies for improved safety/incr ease with ADLs.   Time 4   Period Weeks   Status On-going           OT Long Term Goals - 07/16/17 1907      OT LONG TERM GOAL #1   Title Pt/caregiver will be independent with updated HEP.--check LTGs 09/13/17   Time 8   Period Weeks   Status New     OT LONG TERM GOAL #2   Title Pt will be use LUE as nondominant assist at least 75% of the time for ADLs without cueing.   Time 8   Period Weeks   Status New     OT LONG TERM GOAL #3   Title Pt will perform toileting mod I (including transfer).   Time 8   Period Weeks   Status New     OT LONG TERM GOAL #4   Title Pt will improve LUE coordination/control to improve score on box and blocks test by at least 12 with LUE.   Baseline 19 blocks   Time 8   Period Weeks   Status New     OT LONG TERM GOAL #5   Title Pt will perform simple home maintenance task/snack prep in standing with set-up/supervision.   Time 8   Period Weeks   Status New     OT LONG TERM GOAL #6   Title Pt will perform simple environmental scanning/navigation with supervision.   Time 8   Period Weeks   Status New               Plan - 09/11/17 1711    Clinical Impression Statement Pt is progressing towards goals with improving standing balance and LUE functional use.   Rehab Potential Good   Current Impairments/barriers affecting progress: cognitive deficits, impulsivity   OT Frequency 2x / week   OT Duration 8 weeks   OT Treatment/Interventions Self-care/ADL training;DME and/or AE instruction;Patient/family education;Therapeutic exercises;Balance training;Fluidtherapy;Moist Heat;Ultrasound;Therapeutic exercise;Therapeutic activities;Cryotherapy;Neuromuscular education;Functional Mobility Training;Passive range of motion;Cognitive remediation/compensation;Visual/perceptual remediation/compensation;Manual Therapy       Patient will benefit from skilled therapeutic intervention in order to improve the following deficits and impairments:  Decreased coordination, Decreased range of motion, Difficulty walking, Abnormal gait, Decreased safety awareness, Impaired sensation, Decreased knowledge of precautions, Decreased balance, Decreased knowledge of use of DME, Impaired UE functional use, Decreased cognition, Decreased mobility, Decreased strength, Impaired vision/preception, Impaired perceived functional ability  Visit Diagnosis: Other lack of coordination  Other abnormalities of gait and mobility  Dizziness and giddiness  Visuospatial deficit  Problem List Patient Active Problem List   Diagnosis Date Noted  . Weakness with dizziness-  since [facility] and CVA 04/07/17 08/07/2017  . Disturbances of vision, late effect of stroke 08/06/2017  . Health education/counseling 08/05/2017  . High risk medications (not anticoagulants) long-term use 08/05/2017  . Neuritis-  R sided:  arm and leg/ body due to stroke 08/05/2017  . Elevated LDL cholesterol level 07/11/2017  . [employer] (NIH) Stroke Scale limb ataxia score 2, ataxia present in two limbs 06/25/2017  . 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 Elevated vitamin B12 level 05/30/2017  . Vitamin D deficiency 05/29/2017  . History of tobacco abuse-  30pk yr hx - quit 04/07/17 05/21/2017  . Gait disturbance, post-stroke 05/14/2017  . Benign essential HTN   . Vitreous hemorrhage of right eye ([place])   . Adjustment disorder with mixed anxiety and depressed mood   . 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 s/p SAH (subarachnoid hemorrhage) ([place]) 04/19/2017  . 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 Hypoxia   . Subarachnoid hemorrhage due to ruptured aneurysm ([place]) 04/07/2017  . CVA (cerebral vascular accident) ([place]) 04/07/2017  . Elevated gastrin level  01/25/2012  . Hypokalemia 01/21/2012  . Nausea & vomiting 01/20/2012  . Epigastric pain 01/20/2012  . Duodenal ulcer, acute with obstruction 10/17/2011  . S/P laparoscopic cholecystectomy 10/16/2011    Curby Carswell 09/11/2017, 5:21 PM  [facility] [address] [place], [place], [address] Phone: [phone]   Fax:  [phone]  Name: SANSKRITI GREENLAW MRN: [id number] Date of Birth: Oct 24, 1976

## 2017-09-12 ENCOUNTER — Encounter: Payer: Self-pay | Admitting: Family Medicine

## 2017-09-12 ENCOUNTER — Ambulatory Visit (INDEPENDENT_AMBULATORY_CARE_PROVIDER_SITE_OTHER): Payer: BLUE CROSS/BLUE SHIELD | Admitting: Family Medicine

## 2017-09-12 VITALS — BP 140/84 | HR 82 | Ht 63.25 in | Wt 160.0 lb

## 2017-09-12 DIAGNOSIS — E876 Hypokalemia: Secondary | ICD-10-CM | POA: Diagnosis not present

## 2017-09-12 DIAGNOSIS — F4323 Adjustment disorder with mixed anxiety and depressed mood: Secondary | ICD-10-CM

## 2017-09-12 DIAGNOSIS — I69398 Other sequelae of cerebral infarction: Secondary | ICD-10-CM

## 2017-09-12 DIAGNOSIS — Z79899 Other long term (current) drug therapy: Secondary | ICD-10-CM

## 2017-09-12 DIAGNOSIS — R209 Unspecified disturbances of skin sensation: Secondary | ICD-10-CM

## 2017-09-12 DIAGNOSIS — Z87891 Personal history of nicotine dependence: Secondary | ICD-10-CM | POA: Diagnosis not present

## 2017-09-12 DIAGNOSIS — E559 Vitamin D deficiency, unspecified: Secondary | ICD-10-CM | POA: Diagnosis not present

## 2017-09-12 DIAGNOSIS — I10 Essential (primary) hypertension: Secondary | ICD-10-CM

## 2017-09-12 DIAGNOSIS — R748 Abnormal levels of other serum enzymes: Secondary | ICD-10-CM

## 2017-09-12 DIAGNOSIS — I639 Cerebral infarction, unspecified: Secondary | ICD-10-CM | POA: Diagnosis not present

## 2017-09-12 DIAGNOSIS — M792 Neuralgia and neuritis, unspecified: Secondary | ICD-10-CM | POA: Diagnosis not present

## 2017-09-12 DIAGNOSIS — R531 Weakness: Secondary | ICD-10-CM | POA: Diagnosis not present

## 2017-09-12 DIAGNOSIS — R7989 Other specified abnormal findings of blood chemistry: Secondary | ICD-10-CM

## 2017-09-12 DIAGNOSIS — E7800 Pure hypercholesterolemia, unspecified: Secondary | ICD-10-CM

## 2017-09-12 DIAGNOSIS — Z87440 Personal history of urinary (tract) infections: Secondary | ICD-10-CM

## 2017-09-12 DIAGNOSIS — I608 Other nontraumatic subarachnoid hemorrhage: Secondary | ICD-10-CM

## 2017-09-12 DIAGNOSIS — R42 Dizziness and giddiness: Secondary | ICD-10-CM

## 2017-09-12 DIAGNOSIS — I725 Aneurysm of other precerebral arteries: Secondary | ICD-10-CM

## 2017-09-12 NOTE — Patient Instructions (Addendum)
Please come back around the end of this month for your flu shot as well as to get a urinalysis to make sure the blood and bacteria has cleared from your urine.  Goals-   2 times wkly PT- --> Walk on treadmill twice daily.  - Minimum

## 2017-09-12 NOTE — Progress Notes (Signed)
Impression and Recommendations:    1. Benign essential HTN   2. Adjustment disorder with mixed anxiety and depressed mood   3. Elevated LDL cholesterol level   4. s/p Cerebrovascular accident (CVA), unspecified mechanism ([name])   5. 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 Basilar artery aneurysm ([name])   7. Neuritis-  R sided:  arm and leg/ body due to stroke   8. History of tobacco abuse-  30pk yr hx - quit 04/07/17   9. Hypokalemia   10. Vitamin D deficiency   11. High risk medications (not anticoagulants) long-term use   12. Weakness with dizziness-  since [facility] and CVA 04/07/17   13. Elevated vitamin B12 level   14. Alteration of sensation as late effect of stroke   15. History of UTI     --> Obtain ALT, BMP and vitamin D level Today since patient has been on statin and vitamin D supplement for about 6-8 weeks and we change blood pressure medicines recently. -- Continue current blood pressure medicines; continue to monitor -- Continue current mood medicines at current dose. -- We will check fasting lipid profile next office visit 6 months from when it was prior done early June.   Continue statin.  Patient tolerating well. -  Patient just finished antibiotics yesterday for her urinary tract infection which was diagnosed end of September.  Common in for urinalysis to make sure blood etc. has cleared at the end of this month.   At that time patient will come in for flu vaccine as well.  Pt was in the office today for 40+ minutes, with over 50% time spent in face to face counseling of patients various medical conditions, treatment plans of those medical conditions including medicine management and lifestyle modification, strategies to improve health and well being; and in coordination of care. SEE ABOVE FOR DETAILS  Education and routine counseling performed. Handouts provided.   Return in about 8 weeks (around 11/07/2017) for Hypertension follow up every 4 mo-   reck chol next OV. .  The patient was counseled, risk factors were discussed, anticipatory guidance given.  Gross side effects, risk and benefits, and alternatives of medications discussed with patient.  Patient is aware that all medications have potential side effects and we are unable to predict every side effect or drug-drug interaction that may occur.  Expresses verbal understanding and consents to current therapy plan and treatment regimen.  Patient brought home BP monitor into the office today.  We checked her machine and compared to the readings on both of the machines in our office. The BP monitors in office where both reading same as to what is documented in her vitals.  Patient's home machine is reading high with a reading that was elevated from the reading done on our monitors before and after the reading on her machine.  Patient was advised to check the manual to see if her's can be calibrated or to contact the place that she got the machine and see if they can get a new one since they have just gotten the monitor.   Please see AVS handed out to patient at the end of our visit for further patient instructions/ counseling done pertaining to today's office visit.    Note: This document was prepared using Dragon voice recognition software and may include unintentional dictation errors.     Subjective:    Chief Complaint  Patient presents with  . Follow-up  HPI: [name] Barnett is a 41 y.o. female who presents to [facility] today for follow up for mood, BP, cholesterol etc.  ___>>>>  Approximately 5 weeks ago we made some medication changes.  will get  ALT due to starting statin, and BMP at that time.  Patient was seen 9/27 at the ER for some chills as well as thunderclap headache.  CT head was negative and neurosurgeon evaluated patient.  End result was found to have urinary tract infection patient was treated with 10 days of Keflex.  She just  finished them 2 days ago.  Also patient reports no sensation of UTI symptoms at all after her stroke.     Dizziness: Has improved a lot lately.   Last office visit we discussed patient increasing her water intake--> not drinking much more.   Blood pressure:   ( Decreased amlodipine by half) Also discussed patient decreasing her amlodipine from 5 mg down to 2.5 mg per day.  She was to remain on the valsartan at 40 mg daily.   At home BP's are all over 110's-140's/ 70-80's.    Mood:   ( Doubled Prozac last OV)  For mood, we doubled her Prozac\ fluoxetine to 40 mg daily.   This means she will take 2 capsules daily.   Guilt is gone now- less tearful acts more like herself.    Neuropathic pain:    Patient was going to talk to her neurologist about nerve pain medicine for that right arm and right leg now that your nerve is "waking up ".  PMNR doctor, Dr. Illa Level increased her Neurontin and the pain is much improved  ( patient was on 100 twice daily now she is on 604 times a day.    Cholesterol:  Started her on 20 mg Lipitor 2 OV's ago.   Still tolerating well.  - Status post stroke- now neuropathic sx: She's been having some tingling and zinging sensations in the right side of her body in the right upper arm and lower extremity.  She feels her "numbness is going away" and her "nerves are awakening" ever since her stroke.  It is causing some restless leg type symptoms and it very irritating throughout the entire day and into the evening.  Not worse at night.    Since starting the Prozac and Lipitor-she has no symptoms on the left side of her body of muscle aches or restless leg type symptoms.    Last 3 blood pressure readings in our office are as follows: BP Readings from Last 3 Encounters:  09/12/17 140/84  09/11/17 101/68  09/06/17 117/70    Pulse Readings from Last 3 Encounters:  09/12/17 82  09/11/17 82  09/06/17 (!) 103    Filed Weights   09/12/17 1320  Weight: 160 lb (72.6 kg)        Patient Care Team    Relationship Specialty Notifications Start End  Thomasene Lot, DO PCP - General Family Medicine  04/30/17   Charlott Rakes, MD Consulting Physician Gastroenterology  10/17/11   Lisbeth Renshaw, MD Consulting Physician Neurosurgery  05/21/17   Erick Colace, MD Consulting Physician Physical Medicine and Rehabilitation  05/21/17   Olivia Mackie, MD Consulting Physician Obstetrics and Gynecology  05/21/17   Carmela Rima, MD Consulting Physician Ophthalmology  06/13/17    Comment: Neuro ophthalmologist     Lab Results  Component Value Date   CREATININE 0.70 09/05/2017   BUN 18 09/05/2017  NA 138 09/05/2017   K 4.3 09/05/2017   CL 106 09/05/2017   CO2 20 (L) 09/05/2017    Lab Results  Component Value Date   CHOL 213 (H) 05/21/2017    Lab Results  Component Value Date   HDL 74 05/21/2017    Lab Results  Component Value Date   LDLCALC 111 (H) 05/21/2017    Lab Results  Component Value Date   TRIG 138 05/21/2017    Lab Results  Component Value Date   CHOLHDL 2.9 05/21/2017    No results found for: LDLDIRECT ===================================================================   Patient Active Problem List   Diagnosis Date Noted  . Neuritis-  R sided:  arm and leg/ body due to stroke 08/05/2017    Priority: High  . Elevated LDL cholesterol level 07/11/2017    Priority: High  . History of tobacco abuse-  30pk yr hx - quit 04/07/17 05/21/2017    Priority: High  . Benign essential HTN     Priority: High  . Basilar artery aneurysm ([name])     Priority: High  . 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 CVA (cerebral vascular accident) ([name]) 04/07/2017    Priority: High  . Vitamin D deficiency 05/29/2017    Priority: Medium  . Adjustment disorder with mixed anxiety and depressed mood     Priority: Medium  . Alteration of sensation as late effect of stroke 06/25/2017     Priority: Low  . Gait disturbance, post-stroke 05/14/2017    Priority: Low  . Vitreous hemorrhage of right eye ([name])     Priority: Low  . s/p SAH (subarachnoid hemorrhage) ([name]) 04/19/2017    Priority: Low  . Duodenal ulcer, acute with obstruction 10/17/2011    Priority: Low  . Weakness with dizziness-  since [facility] and CVA 04/07/17 08/07/2017  . Disturbances of vision, late effect of stroke 08/06/2017  . Health education/counseling 08/05/2017  . High risk medications (not anticoagulants) long-term use 08/05/2017  . [employer] of [employer] (NIH) Stroke Scale limb ataxia score 2, ataxia present in two limbs 06/25/2017  . Elevated vitamin B12 level 05/30/2017  . 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 Hypoxia   . Elevated gastrin level 01/25/2012  . Hypokalemia 01/21/2012  . Nausea & vomiting 01/20/2012  . Epigastric pain 01/20/2012  . S/P laparoscopic cholecystectomy 10/16/2011     Past Medical History:  Diagnosis Date  . Duodenal obstruction   . GERD (gastroesophageal reflux disease)   . Hypertension   . Stroke [facility])    april 2018     Past Surgical History:  Procedure Laterality Date  . ABDOMINAL HYSTERECTOMY    . BALLOON DILATION  12/31/2011   Procedure: BALLOON DILATION;  Surgeon: Barrie Folk, MD;  Location: [facility] ENDOSCOPY;  Service: Endoscopy;  Laterality: N/A;  . CHOLECYSTECTOMY  07/28/11  . ESOPHAGOGASTRODUODENOSCOPY  10/17/2011   Procedure: ESOPHAGOGASTRODUODENOSCOPY (EGD);  Surgeon: Barrie Folk, MD;  Location: [facility] ENDOSCOPY;  Service: Endoscopy;  Laterality: N/A;  . IR ANGIO INTRA EXTRACRAN SEL INTERNAL CAROTID BILAT MOD SED  04/07/2017  . IR ANGIO VERTEBRAL SEL VERTEBRAL UNI R MOD SED  04/07/2017  . IR ANGIOGRAM FOLLOW UP STUDY  04/07/2017  . IR ANGIOGRAM FOLLOW UP STUDY  04/07/2017  . IR ANGIOGRAM FOLLOW UP STUDY  04/07/2017  . IR ANGIOGRAM FOLLOW UP STUDY  04/07/2017  . IR ANGIOGRAM FOLLOW UP  STUDY   04/07/2017  . IR ANGIOGRAM SELECTIVE EACH ADDITIONAL VESSEL  04/07/2017  . IR TRANSCATH/EMBOLIZ  04/07/2017  . PARS PLANA VITRECTOMY Right 05/01/2017   Procedure: PARS PLANA VITRECTOMY WITH 25 GAUGE RIGHT EYE, endolaser photocoaglation;  Surgeon: Carmela Rima, MD;  Location: [facility] OR;  Service: Ophthalmology;  Laterality: Right;  . RADIOLOGY WITH ANESTHESIA N/A 04/07/2017   Procedure: RADIOLOGY WITH ANESTHESIA;  Surgeon: Lisbeth Renshaw, MD;  Location: [facility] OR;  Service: Radiology;  Laterality: N/A;  . REPLACEMENT TOTAL KNEE BILATERAL  2004  . TEMPOROMANDIBULAR JOINT SURGERY     2 surgeries  . TUMOR REMOVAL    . VAGOTOMY  01/23/2012   Procedure: VAGOTOMY, antrectomy and BII;  Surgeon: Currie Paris, MD;  Location: [facility] OR;  Service: General;  Laterality: N/A;  Laparotomy with vagotomy.     Family History  Problem Relation Age of Onset  . Hypertension Mother   . Restless legs syndrome Mother   . Hyperlipidemia Mother   . Heart disease Father   . Malignant hyperthermia Neg Hx      History  Drug Use No  ,  History  Alcohol Use No  ,  History  Smoking Status  . Former Smoker  . Packs/day: 1.00  . Years: 20.00  . Types: Cigarettes  . Quit date: 04/07/2017  Smokeless Tobacco  . Never Used  ,    Current Outpatient Prescriptions on File Prior to Visit  Medication Sig Dispense Refill  . amLODipine (NORVASC) 2.5 MG tablet Take 1 tablet (2.5 mg total) by mouth daily. 90 tablet 1  . atorvastatin (LIPITOR) 20 MG tablet Take 1 tablet (20 mg total) by mouth at bedtime. 90 tablet 3  . cephALEXin (KEFLEX) 500 MG capsule Take 1 capsule (500 mg total) by mouth 4 (four) times daily. 20 capsule 0  . Cholecalciferol (VITAMIN D3) 5000 units TABS 5,000 IU OTC vitamin D3 daily. 90 tablet 3  . FLUoxetine (PROZAC) 20 MG capsule Take 2 capsules (40 mg total) by mouth daily. 180 capsule 1  . gabapentin (NEURONTIN) 600 MG tablet Take 1 tablet (600 mg total) by mouth 4 (four) times daily. 360 tablet  1  . valsartan (DIOVAN) 40 MG tablet Take 0.5 tablets (20 mg total) by mouth daily. 45 tablet 1  . Vitamin D, Ergocalciferol, (DRISDOL) 50000 units CAPS capsule Take 1 capsule (50,000 Units total) by mouth every 7 (seven) days. 12 capsule 10   No current facility-administered medications on file prior to visit.      Allergies  Allergen Reactions  . Nsaids Other (See Comments)    Pt diagnosed with near-obstructing circumferential ulcer of duodenum [id number]     Review of Systems:   General:  Denies fever, chills Optho/Auditory:   Denies visual changes, blurred vision Respiratory:   Denies SOB, cough, wheeze, DIB  Cardiovascular:   Denies chest pain, palpitations, painful respirations Gastrointestinal:   Denies nausea, vomiting, diarrhea.  Endocrine:     Denies new hot or cold intolerance Musculoskeletal:  Denies joint swelling, gait issues, or new unexplained myalgias/ arthralgias Skin:  Denies rash, suspicious lesions  Neurological:    Denies new onset of dizziness sx, unexplained weakness, numbness - no change in neuro sx Psychiatric/Behavioral:   Denies mood changes  Objective:    Blood pressure 140/84, pulse 82, height 5' 3.25" (1.607 m), weight 160 lb (72.6 kg).  Body mass index is 28.12 kg/m.  General: Well Developed, well nourished, and in no acute distress.  HEENT: Normocephalic, atraumatic, pupils  equal round reactive to light, neck supple, No carotid bruits, no JVD Skin: Warm and dry, cap RF less 2 sec Cardiac: Regular rate and rhythm, S1, S2 WNL's, no murmurs rubs or gallops Respiratory: ECTA B/L, Not using accessory muscles, speaking in full sentences. NeuroM-Sk: Ambulates w/ assistance, moves ext * 4 w/o difficulty, sensation grossly intact.  Ext: scant edema b/l lower ext Psych: No HI/SI, judgement and insight good, Euthymic mood. Full Affect.

## 2017-09-13 ENCOUNTER — Ambulatory Visit: Payer: BLUE CROSS/BLUE SHIELD | Admitting: Occupational Therapy

## 2017-09-13 ENCOUNTER — Encounter: Payer: Self-pay | Admitting: Physical Therapy

## 2017-09-13 ENCOUNTER — Ambulatory Visit: Payer: BLUE CROSS/BLUE SHIELD | Admitting: Physical Therapy

## 2017-09-13 DIAGNOSIS — R2689 Other abnormalities of gait and mobility: Secondary | ICD-10-CM

## 2017-09-13 DIAGNOSIS — R2681 Unsteadiness on feet: Secondary | ICD-10-CM

## 2017-09-13 DIAGNOSIS — R42 Dizziness and giddiness: Secondary | ICD-10-CM

## 2017-09-13 DIAGNOSIS — R41842 Visuospatial deficit: Secondary | ICD-10-CM

## 2017-09-13 DIAGNOSIS — R41840 Attention and concentration deficit: Secondary | ICD-10-CM

## 2017-09-13 DIAGNOSIS — R260 Ataxic gait: Secondary | ICD-10-CM

## 2017-09-13 DIAGNOSIS — R278 Other lack of coordination: Secondary | ICD-10-CM

## 2017-09-13 DIAGNOSIS — R208 Other disturbances of skin sensation: Secondary | ICD-10-CM

## 2017-09-13 LAB — VITAMIN B12: Vitamin B-12: 1474 pg/mL — ABNORMAL HIGH (ref 232–1245)

## 2017-09-13 LAB — ALT: ALT: 21 IU/L (ref 0–32)

## 2017-09-13 LAB — VITAMIN D 25 HYDROXY (VIT D DEFICIENCY, FRACTURES): Vit D, 25-Hydroxy: 48.6 ng/mL (ref 30.0–100.0)

## 2017-09-13 NOTE — Therapy (Signed)
[facility] [address] [place], [place], [address] Phone: [phone]   Fax:  [phone]  Occupational Therapy Treatment  Patient Details  Name: [name] Barnett MRN: [id number] Date of Birth: 12/21/1975 Referring Provider: Dr. Alysia Penna  Encounter Date: 09/13/2017      OT End of Session - 09/13/17 1600    Visit Number 12   Number of Visits 17   Date for OT Re-Evaluation 09/19/17   Authorization Type [employer], no visit limit/auth req.   OT Start Time 1450   OT Stop Time 1530   OT Time Calculation (min) 40 min      Past Medical History:  Diagnosis Date  . Duodenal obstruction   . GERD (gastroesophageal reflux disease)   . Hypertension   . Stroke [facility])    april 2018    Past Surgical History:  Procedure Laterality Date  . ABDOMINAL HYSTERECTOMY    . BALLOON DILATION  12/31/2011   Procedure: BALLOON DILATION;  Surgeon: Missy Sabins, MD;  Location: [facility] ENDOSCOPY;  Service: Endoscopy;  Laterality: N/A;  . CHOLECYSTECTOMY  07/28/11  . ESOPHAGOGASTRODUODENOSCOPY  10/17/2011   Procedure: ESOPHAGOGASTRODUODENOSCOPY (EGD);  Surgeon: Missy Sabins, MD;  Location: [facility] ENDOSCOPY;  Service: Endoscopy;  Laterality: N/A;  . IR ANGIO INTRA EXTRACRAN SEL INTERNAL CAROTID BILAT MOD SED  04/07/2017  . IR ANGIO VERTEBRAL SEL VERTEBRAL UNI R MOD SED  04/07/2017  . IR ANGIOGRAM FOLLOW UP STUDY  04/07/2017  . IR ANGIOGRAM FOLLOW UP STUDY  04/07/2017  . IR ANGIOGRAM FOLLOW UP STUDY  04/07/2017  . IR ANGIOGRAM FOLLOW UP STUDY  04/07/2017  . IR ANGIOGRAM FOLLOW UP STUDY  04/07/2017  . IR ANGIOGRAM SELECTIVE EACH ADDITIONAL VESSEL  04/07/2017  . IR TRANSCATH/EMBOLIZ  04/07/2017  . PARS PLANA VITRECTOMY Right 05/01/2017   Procedure: PARS PLANA VITRECTOMY WITH 25 GAUGE RIGHT EYE, endolaser photocoaglation;  Surgeon: Jalene Mullet, MD;  Location: [facility];  Service: Ophthalmology;  Laterality: Right;  . RADIOLOGY WITH ANESTHESIA N/A 04/07/2017   Procedure: RADIOLOGY WITH ANESTHESIA;  Surgeon: Consuella Lose, MD;  Location: [place];  Service: Radiology;  Laterality: N/A;  . REPLACEMENT TOTAL KNEE BILATERAL  2004  . TEMPOROMANDIBULAR JOINT SURGERY     2 surgeries  . TUMOR REMOVAL    . VAGOTOMY  01/23/2012   Procedure: VAGOTOMY, antrectomy and BII;  Surgeon: Haywood Lasso, MD;  Location: [place];  Service: General;  Laterality: N/A;  Laparotomy with vagotomy.    There were no vitals filed for this visit.      Subjective Assessment - 09/13/17 1453    Pertinent History (P)  Subarachnoid hemorrhage due to ruptured aneurysm, ataxia (L cerebellar); HTN, R side decr sensation, vertical diplopia/visual deficits, ataxia, Bilateral retinal hemorhage associated with SAH (with surgery on R eye in hospital and L approx 1 month ago)   Limitations (P)  visual deficits, fall risk, impulsivity/cognitive deficits   Patient Stated Goals (P)  be able to walk and use L arm             Treatment: Tall kneeling leaning over bench on elbows while reaching to place large pegs into pegboard with right and left UE's, mod difficulty with LUE, min-mod v.c to weightshift to left. Modified cat/ cow and rocking forwards and backwards in quadraped over bench. Seated edge of mat reaching to right and left sides with LUE to retrieve cones with trunk rotation, block under right foot for increased weight shift to left  side. Arm bike x 6 mins level 3 for conditioning.                   OT Short Term Goals - 08/22/17 1559      OT SHORT TERM GOAL #1   Title Pt/caregiver will be independent with initial HEP.--check STGs 08/14/17   Time 4   Period Weeks   Status Achieved  08/19/17:  with cueing from husband     OT [place] #2   Title Pt will be use LUE as nondominant assist at least 50% of the time for ADLs without cueing.   Time 4   Period Weeks   Status On-going  08/19/17:  approx 40% of the time     OT SHORT TERM GOAL #3   Title  Pt will improve LUE coordination/control to improve score on box and blocks test by at least 6 with LUE.   Baseline 19 blocks   Time 4   Period Weeks   Status On-going  08/22/17:  18, 20 blocks     OT SHORT TERM GOAL #4   Title Pt will perform simple home maintenance task/snack prep in sitting with set-up.   Time 4   Period Weeks   Status Achieved  08/19/17:  met in clinic with set-up and cueing/prompts for encouragement/to slow down     OT SHORT TERM GOAL #5   Title Pt/husband will verbalize understanding of visual compensation stategies for improved safety/incr ease with ADLs.   Time 4   Period Weeks   Status On-going           OT Long Term Goals - 07/16/17 1907      OT LONG TERM GOAL #1   Title Pt/caregiver will be independent with updated HEP.--check LTGs 09/13/17   Time 8   Period Weeks   Status New     OT LONG TERM GOAL #2   Title Pt will be use LUE as nondominant assist at least 75% of the time for ADLs without cueing.   Time 8   Period Weeks   Status New     OT LONG TERM GOAL #3   Title Pt will perform toileting mod I (including transfer).   Time 8   Period Weeks   Status New     OT LONG TERM GOAL #4   Title Pt will improve LUE coordination/control to improve score on box and blocks test by at least 12 with LUE.   Baseline 19 blocks   Time 8   Period Weeks   Status New     OT LONG TERM GOAL #5   Title Pt will perform simple home maintenance task/snack prep in standing with set-up/supervision.   Time 8   Period Weeks   Status New     OT LONG TERM GOAL #6   Title Pt will perform simple environmental scanning/navigation with supervision.   Time 8   Period Weeks   Status New               Plan - 09/13/17 1604    Clinical Impression Statement Pt is progressing towards goals with improved LUE control and awareness.   Rehab Potential Good   Current Impairments/barriers affecting progress: cognitive deficits, impulsivity   OT Frequency 2x /  week   OT Duration 8 weeks   OT Treatment/Interventions Self-care/ADL training;DME and/or AE instruction;Patient/family education;Therapeutic exercises;Balance training;Fluidtherapy;Moist Heat;Ultrasound;Therapeutic exercise;Therapeutic activities;Cryotherapy;Neuromuscular education;Functional Mobility Training;Passive range of motion;Cognitive remediation/compensation;Visual/perceptual remediation/compensation;Manual Therapy   Plan  check goals next week   OT Home Exercise Plan Education provided:  HEP for LUE control, coordination, and functional use   Consulted and Agree with Plan of Care Patient;Family member/caregiver   Family Member Consulted husband      Patient will benefit from skilled therapeutic intervention in order to improve the following deficits and impairments:  Decreased coordination, Decreased range of motion, Difficulty walking, Abnormal gait, Decreased safety awareness, Impaired sensation, Decreased knowledge of precautions, Decreased balance, Decreased knowledge of use of DME, Impaired UE functional use, Decreased cognition, Decreased mobility, Decreased strength, Impaired vision/preception, Impaired perceived functional ability  Visit Diagnosis: Other abnormalities of gait and mobility  Other lack of coordination  Unsteadiness on feet  Visuospatial deficit  Other disturbances of skin sensation  Attention and concentration deficit    Problem List Patient Active Problem List   Diagnosis Date Noted  . Weakness with dizziness-  since [facility] and CVA 04/07/17 08/07/2017  . Disturbances of vision, late effect of stroke 08/06/2017  . Health education/counseling 08/05/2017  . High risk medications (not anticoagulants) long-term use 08/05/2017  . Neuritis-  R sided:  arm and leg/ body due to stroke 08/05/2017  . Elevated LDL cholesterol level 07/11/2017  . [employer] of [employer] (NIH) Stroke Scale limb ataxia score 2, ataxia present in two limbs 06/25/2017  .  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 Elevated vitamin B12 level 05/30/2017  . Vitamin D deficiency 05/29/2017  . History of tobacco abuse-  30pk yr hx - quit 04/07/17 05/21/2017  . Gait disturbance, post-stroke 05/14/2017  . Benign essential HTN   . Vitreous hemorrhage of right eye ([place])   . Adjustment disorder with mixed anxiety and depressed mood   . 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 Terson syndrome of both eyes ([place]) 04/23/2017  . s/p SAH (subarachnoid hemorrhage) ([place]) 04/19/2017  . Basilar artery aneurysm ([place])   . Hypoxia   . Subarachnoid hemorrhage due to ruptured aneurysm ([place]) 04/07/2017  . CVA (cerebral vascular accident) ([place]) 04/07/2017  . Elevated gastrin level 01/25/2012  . Hypokalemia 01/21/2012  . Nausea & vomiting 01/20/2012  . Epigastric pain 01/20/2012  . Duodenal ulcer, acute with obstruction 10/17/2011  . S/P laparoscopic cholecystectomy 10/16/2011    [name] Barnett 09/13/2017, 4:05 PM  [facility] [address] [place], [place], [address] Phone: [phone]   Fax:  [phone]  Name: [name] Barnett MRN: [id number] Date of Birth: May 11, 1976

## 2017-09-13 NOTE — Therapy (Signed)
[facility] [address] [place], [place], [address] Phone: [phone]   Fax:  [phone]  Physical Therapy Treatment  Patient Details  Name: [name] Barnett MRN: [id number] Date of Birth: [birth year] Referring Provider: Charlett Blake, MD  Encounter Date: 09/13/2017      PT End of Session - 09/13/17 1624    Visit Number 14   Number of Visits 17   Date for PT Re-Evaluation 09/13/17   Authorization Type BCBS   PT Start Time 2248   PT Stop Time 1618   PT Time Calculation (min) 47 min   Activity Tolerance Patient tolerated treatment well;Patient limited by fatigue   Behavior During Therapy [facility] for tasks assessed/performed      Past Medical History:  Diagnosis Date  . Duodenal obstruction   . GERD (gastroesophageal reflux disease)   . Hypertension   . Stroke [facility])    april 2018    Past Surgical History:  Procedure Laterality Date  . ABDOMINAL HYSTERECTOMY    . BALLOON DILATION  12/31/2011   Procedure: BALLOON DILATION;  Surgeon: Missy Sabins, MD;  Location: [facility] ENDOSCOPY;  Service: Endoscopy;  Laterality: N/A;  . CHOLECYSTECTOMY  07/28/11  . ESOPHAGOGASTRODUODENOSCOPY  10/17/2011   Procedure: ESOPHAGOGASTRODUODENOSCOPY (EGD);  Surgeon: Missy Sabins, MD;  Location: [facility] ENDOSCOPY;  Service: Endoscopy;  Laterality: N/A;  . IR ANGIO INTRA EXTRACRAN SEL INTERNAL CAROTID BILAT MOD SED  04/07/2017  . IR ANGIO VERTEBRAL SEL VERTEBRAL UNI R MOD SED  04/07/2017  . IR ANGIOGRAM FOLLOW UP STUDY  04/07/2017  . IR ANGIOGRAM FOLLOW UP STUDY  04/07/2017  . IR ANGIOGRAM FOLLOW UP STUDY  04/07/2017  . IR ANGIOGRAM FOLLOW UP STUDY  04/07/2017  . IR ANGIOGRAM FOLLOW UP STUDY  04/07/2017  . IR ANGIOGRAM SELECTIVE EACH ADDITIONAL VESSEL  04/07/2017  . IR TRANSCATH/EMBOLIZ  04/07/2017  . PARS PLANA VITRECTOMY Right 05/01/2017   Procedure: PARS PLANA VITRECTOMY WITH 25 GAUGE RIGHT EYE, endolaser photocoaglation;  Surgeon: Jalene Mullet,  MD;  Location: [place];  Service: Ophthalmology;  Laterality: Right;  . RADIOLOGY WITH ANESTHESIA N/A 04/07/2017   Procedure: RADIOLOGY WITH ANESTHESIA;  Surgeon: Consuella Lose, MD;  Location: [place];  Service: Radiology;  Laterality: N/A;  . REPLACEMENT TOTAL KNEE BILATERAL  2004  . TEMPOROMANDIBULAR JOINT SURGERY     2 surgeries  . TUMOR REMOVAL    . VAGOTOMY  01/23/2012   Procedure: VAGOTOMY, antrectomy and BII;  Surgeon: Haywood Lasso, MD;  Location: [place];  Service: General;  Laterality: N/A;  Laparotomy with vagotomy.    There were no vitals filed for this visit.      Subjective Assessment - 09/13/17 1532    Subjective Pt is improving from UTI; finished antibiotics.  Pt having a good day today, good day x 2 mentally.   Patient is accompained by: Family member   Limitations Standing;Walking   Currently in Pain? No/denies            [facility] PT Assessment - 09/13/17 1537      Assessment   Medical Diagnosis L cerebellar infarct   Referring Provider Charlett Blake, MD   Onset Date/Surgical Date 04/07/17   Prior Therapy inpatient rehab, HHPT/OT after D/C from hospital     Prior Function   Level of Independence Independent   Vocation Full time employment     Ambulation/Gait   Ambulation/Gait Yes   Ambulation/Gait Assistance 4: Min guard   Ambulation Distance (Feet)  100 Feet   Assistive device Rolling walker   Gait Pattern Step-through pattern;Ataxic   Gait velocity 19.38 = 1.69 ft/sec with RW  2nd trial 18.44 = 1.78 ft/sec with RW and CGA     Berg Balance Test   Sit to Stand Able to stand without using hands and stabilize independently   Standing Unsupported Able to stand safely 2 minutes   Sitting with Back Unsupported but Feet Supported on Floor or Stool Able to sit safely and securely 2 minutes   Stand to Sit Sits safely with minimal use of hands   Transfers Able to transfer safely, definite need of hands   Standing Unsupported with Eyes Closed Able to  stand 10 seconds with supervision   Standing Ubsupported with Feet Together Able to place feet together independently and stand for 1 minute with supervision   From Standing, Reach Forward with Outstretched Arm Can reach confidently >25 cm (10")   From Standing Position, Pick up Object from Floor Able to pick up shoe, needs supervision   From Standing Position, Turn to Look Behind Over each Shoulder Needs supervision when turning   Turn 360 Degrees Needs close supervision or verbal cueing   Standing Unsupported, Alternately Place Feet on Step/Stool Able to complete >2 steps/needs minimal assist   Standing Unsupported, One Foot in Front Able to take small step independently and hold 30 seconds   Standing on One Leg Able to lift leg independently and hold equal to or more than 3 seconds  RLE   Total Score 39   Berg comment: 39/56                     [facility] Adult PT Treatment/Exercise - 09/13/17 1537      Self-Care   Self-Care Other Self-Care Comments   Other Self-Care Comments  Pt reports getting out of husband's truck, getting her own walker out of the back of the truck and walking into clinic without husband's supervision; continued to encourage husband to be with pt due to significant risk for LOB and falls     Neuro Re-ed    Neuro Re-ed Details  NMR to focus on weight shifting and proximal hip stability in standing with R and then LUE reaching up the wall for trunk elongation and weight shift while performing taps with contralateral LE to 4" step; also performed partial single limb stance with foot wedged on block for further weight shift and maintaining weight shift while performing head turns and head nods x 10 each, each LE.  Pt required max cues to maintain activation of R glute med during R stance and required max A to maintain balance when weight shifting to L side.                PT Education - 09/13/17 1624    Education provided Yes   Education Details safety  and continued supervision of husband when ambulating over pavement/outside, progress towards goals   Person(s) Educated Patient;Spouse   Methods Explanation;Demonstration   Comprehension Verbalized understanding          PT Short Term Goals - 08/16/17 1411      PT SHORT TERM GOAL #1   Title Pt will participate in vestibular assessment and assessment of gait velocity with LTG to be set    Time 4   Period Weeks   Status Achieved     PT SHORT TERM GOAL #2   Title Pt will improve balance and midline orientation to  perform sit <> stand with supervision and be able to maintain static standing x 2 minutes without UE support and no LOB to R   Baseline met 08/16/17   Time 4   Period Weeks   Status Achieved     PT SHORT TERM GOAL #3   Title Pt will decrease falls risk as indicated by increase in BERG balance score to > or = 22/56   Baseline 17/56 to 22/56 on 08/16/17   Time 4   Period Weeks   Status Achieved     PT SHORT TERM GOAL #4   Title Pt will ambulate x 150' on indoor surfaces with LRAD and min A and require <10% assistance to maintain AD in straight path and <10% verbal cues for gait pattern.   Baseline partially met, felt that she needed mod cues for safe gait pattern.    Time 4   Period Weeks   Status Partially Met           PT Long Term Goals - 09/13/17 1625      PT LONG TERM GOAL #1   Title Pt and husband will demonstrate independence with HEP   Baseline Pt reports not doing exercises on a consistent basis - 09-11-17   Status Not Met     PT LONG TERM GOAL #2   Title Pt will demonstrate improved balance and decreased falls risk as indicated by BERG balance score of > or = 27/56   Baseline 39/56   Status Achieved     PT LONG TERM GOAL #3   Title Pt will ambulate with LRAD x 300' over level indoor and paved outdoor surfaces with supervision and intermittent cues for gait sequence   Status Achieved     PT LONG TERM GOAL #4   Title Pt will report 15% improvement in  Neuro QOL-LE   Status On-going     PT LONG TERM GOAL #5   Title Pt will decrease falls risk during gait in home/community as indicated by increase in gait velocity to > or = 1.46 ft/sec with LRAD   Baseline 19.38, 18.44 = 1.69 ft/sec / 1.78 ft/sec with RW   Status Achieved     PT LONG TERM GOAL #6   Title Pt will report <2/10 dizziness with head movements and will demonstrate improved gaze stability as indicated by ability to perform x 1 viewing HEP x 60 seconds in each direction   Baseline Pt reports very minimal dizziness (1/10) with x1 viewing in standing - 09-11-17   Status Achieved               Plan - 09/13/17 1628    Clinical Impression Statement Continued assessment of LTG and progress.  Pt has met 4/6 LTG; pt and husband report they have not been performing HEP consistently and pt is currently not safe to perform on her own at home when she is alone.  Pt is demonstrating improved static and dynamic standing balance, improved safety and balance with gait with RW as indicated by increase in gait velocity and ability to ambulate on indoor and outdoor paved surfaces with RW and supervision and pt is reporting a significant decrease in dizziness overall.  Pt continues to present with significant impairments in LE strength, trunk/postural control, balance, motor planning and grading of movement, gait and safety and would benefit from ongoing skilled PT services to address impairments to further decrease falls risk and maximize functional mobility independence.   Rehab Potential Good  PT Frequency 2x / week   PT Duration 8 weeks   PT Treatment/Interventions ADLs/Self Care Home Management;Aquatic Therapy;DME Instruction;Gait training;Stair training;Functional mobility training;Therapeutic activities;Therapeutic exercise;Balance training;Neuromuscular re-education;Patient/family education;Vestibular;Visual/perceptual remediation/compensation;Cognitive remediation;Orthotic Fit/Training    PT Next Visit Plan ASSESS VITALS AT [facility] VISIT.  Continue to work on weight shifting to improve turning/pivoting, SLS, partial tandem stance, taps to step.  Treadmill gait training.  Assess DGI when appropriate.   Consulted and Agree with Plan of Care Patient;Family member/caregiver   Family Member Consulted husband      Patient will benefit from skilled therapeutic intervention in order to improve the following deficits and impairments:  Abnormal gait, Decreased balance, Decreased cognition, Decreased coordination, Decreased strength, Difficulty walking, Dizziness, Impaired sensation, Impaired vision/preception, Pain, Decreased activity tolerance, Decreased mobility  Visit Diagnosis: Ataxic gait  Dizziness and giddiness  Unsteadiness on feet  Other abnormalities of gait and mobility  Other lack of coordination     Problem List Patient Active Problem List   Diagnosis Date Noted  . Weakness with dizziness-  since [facility] and CVA 04/07/17 08/07/2017  . Disturbances of vision, late effect of stroke 08/06/2017  . Health education/counseling 08/05/2017  . High risk medications (not anticoagulants) long-term use 08/05/2017  . Neuritis-  R sided:  arm and leg/ body due to stroke 08/05/2017  . Elevated LDL cholesterol level 07/11/2017  . [employer] (NIH) Stroke Scale limb ataxia score 2, ataxia present in two limbs 06/25/2017  . 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 Elevated vitamin B12 level 05/30/2017  . Vitamin D deficiency 05/29/2017  . History of tobacco abuse-  30pk yr hx - quit 04/07/17 05/21/2017  . Gait disturbance, post-stroke 05/14/2017  . Benign essential HTN   . Vitreous hemorrhage of right eye ([place])   . Adjustment disorder with mixed anxiety and depressed mood   . 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 s/p SAH (subarachnoid hemorrhage) ([place]) 04/19/2017  . 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 Hypoxia   . Subarachnoid hemorrhage due to ruptured aneurysm ([place]) 04/07/2017  . CVA (cerebral vascular accident) ([place]) 04/07/2017  . Elevated gastrin level 01/25/2012  . Hypokalemia 01/21/2012  . Nausea & vomiting 01/20/2012  . Epigastric pain 01/20/2012  . Duodenal ulcer, acute with obstruction 10/17/2011  . S/P laparoscopic cholecystectomy 10/16/2011    Raylene Everts, PT, DPT 09/13/17    4:44 PM    [facility] [address] [place], [place], [address] Phone: [phone]   Fax:  [phone]  Name: [name] Barnett MRN: [id number] Date of Birth: July 10, 1976

## 2017-09-17 ENCOUNTER — Ambulatory Visit: Payer: BLUE CROSS/BLUE SHIELD | Admitting: Physical Therapy

## 2017-09-17 ENCOUNTER — Telehealth: Payer: Self-pay

## 2017-09-17 ENCOUNTER — Encounter: Payer: Self-pay | Admitting: Physical Therapy

## 2017-09-17 ENCOUNTER — Ambulatory Visit: Payer: BLUE CROSS/BLUE SHIELD | Admitting: Occupational Therapy

## 2017-09-17 VITALS — BP 130/87

## 2017-09-17 VITALS — BP 124/86

## 2017-09-17 DIAGNOSIS — R278 Other lack of coordination: Secondary | ICD-10-CM

## 2017-09-17 DIAGNOSIS — R2681 Unsteadiness on feet: Secondary | ICD-10-CM

## 2017-09-17 DIAGNOSIS — R42 Dizziness and giddiness: Secondary | ICD-10-CM

## 2017-09-17 DIAGNOSIS — R41842 Visuospatial deficit: Secondary | ICD-10-CM

## 2017-09-17 DIAGNOSIS — R2689 Other abnormalities of gait and mobility: Secondary | ICD-10-CM

## 2017-09-17 DIAGNOSIS — R208 Other disturbances of skin sensation: Secondary | ICD-10-CM

## 2017-09-17 NOTE — Therapy (Signed)
[facility] [address] [place], [place], [address] Phone: [phone]   Fax:  [phone]  Occupational Therapy Treatment  Patient Details  Name: [name] Barnett MRN: [id number] Date of Birth: 02-27-1976 Referring Provider: Dr. Alysia Penna  Encounter Date: 09/17/2017      OT End of Session - 09/17/17 1645    Visit Number 13   Number of Visits 17   Date for OT Re-Evaluation 09/19/17   Authorization Type BCBS, no visit limit/auth req.   OT Start Time 1450   OT Stop Time 1530   OT Time Calculation (min) 40 min   Activity Tolerance Patient tolerated treatment well   Behavior During Therapy WFL for tasks assessed/performed      Past Medical History:  Diagnosis Date  . Duodenal obstruction   . GERD (gastroesophageal reflux disease)   . Hypertension   . Stroke [facility])    april 2018    Past Surgical History:  Procedure Laterality Date  . ABDOMINAL HYSTERECTOMY    . BALLOON DILATION  12/31/2011   Procedure: BALLOON DILATION;  Surgeon: Missy Sabins, MD;  Location: [facility] ENDOSCOPY;  Service: Endoscopy;  Laterality: N/A;  . CHOLECYSTECTOMY  07/28/11  . ESOPHAGOGASTRODUODENOSCOPY  10/17/2011   Procedure: ESOPHAGOGASTRODUODENOSCOPY (EGD);  Surgeon: Missy Sabins, MD;  Location: [facility] ENDOSCOPY;  Service: Endoscopy;  Laterality: N/A;  . IR ANGIO INTRA EXTRACRAN SEL INTERNAL CAROTID BILAT MOD SED  04/07/2017  . IR ANGIO VERTEBRAL SEL VERTEBRAL UNI R MOD SED  04/07/2017  . IR ANGIOGRAM FOLLOW UP STUDY  04/07/2017  . IR ANGIOGRAM FOLLOW UP STUDY  04/07/2017  . IR ANGIOGRAM FOLLOW UP STUDY  04/07/2017  . IR ANGIOGRAM FOLLOW UP STUDY  04/07/2017  . IR ANGIOGRAM FOLLOW UP STUDY  04/07/2017  . IR ANGIOGRAM SELECTIVE EACH ADDITIONAL VESSEL  04/07/2017  . IR TRANSCATH/EMBOLIZ  04/07/2017  . PARS PLANA VITRECTOMY Right 05/01/2017   Procedure: PARS PLANA VITRECTOMY WITH 25 GAUGE RIGHT EYE, endolaser photocoaglation;  Surgeon: Jalene Mullet,  MD;  Location: [place];  Service: Ophthalmology;  Laterality: Right;  . RADIOLOGY WITH ANESTHESIA N/A 04/07/2017   Procedure: RADIOLOGY WITH ANESTHESIA;  Surgeon: Consuella Lose, MD;  Location: [place];  Service: Radiology;  Laterality: N/A;  . REPLACEMENT TOTAL KNEE BILATERAL  2004  . TEMPOROMANDIBULAR JOINT SURGERY     2 surgeries  . TUMOR REMOVAL    . VAGOTOMY  01/23/2012   Procedure: VAGOTOMY, antrectomy and BII;  Surgeon: Haywood Lasso, MD;  Location: [place];  Service: General;  Laterality: N/A;  Laparotomy with vagotomy.    Vitals:   09/17/17 1457  BP: 130/87        Subjective Assessment - 09/17/17 1651    Pertinent History Subarachnoid hemorrhage due to ruptured aneurysm, ataxia (L cerebellar); HTN, R side decr sensation, vertical diplopia/visual deficits, ataxia, Bilateral retinal hemorhage associated with SAH (with surgery on R eye in hospital and L approx 1 month ago)   Limitations visual deficits, fall risk, impulsivity/cognitive deficits   Patient Stated Goals be able to walk and use L arm   Currently in Pain? Yes   Pain Score 5    Pain Location Head   Pain Descriptors / Indicators Aching   Pain Type Chronic pain   Pain Onset More than a month ago   Pain Frequency Intermittent   Aggravating Factors  unknown   Pain Relieving Factors unknown   Multiple Pain Sites No  Treatment: Therapist started checking progress towards long term goals.see goals for  Standing at tabletop rocking forwards and backwards while weightbearing through bilateral UE's, min A/ v.c. Therapist reinforced the importance of pt performing daily coordination exercises, as pt reports she has not been performing. Pt practiced flipping and dealing cards with LUE, mod-max difficulty initially, then min v.c Functional reaching in seated to place various sized pegs in semicircle with LUE, mod difficulty, drops                        OT Short Term Goals - 08/22/17 1559       OT SHORT TERM GOAL #1   Title Pt/caregiver will be independent with initial HEP.--check STGs 08/14/17   Time 4   Period Weeks   Status Achieved  08/19/17:  with cueing from husband     OT [place] #2   Title Pt will be use LUE as nondominant assist at least 50% of the time for ADLs without cueing.   Time 4   Period Weeks   Status On-going  08/19/17:  approx 40% of the time     OT SHORT TERM GOAL #3   Title Pt will improve LUE coordination/control to improve score on box and blocks test by at least 6 with LUE.   Baseline 19 blocks   Time 4   Period Weeks   Status On-going  08/22/17:  18, 20 blocks     OT SHORT TERM GOAL #4   Title Pt will perform simple home maintenance task/snack prep in sitting with set-up.   Time 4   Period Weeks   Status Achieved  08/19/17:  met in clinic with set-up and cueing/prompts for encouragement/to slow down     OT SHORT TERM GOAL #5   Title Pt/husband will verbalize understanding of visual compensation stategies for improved safety/incr ease with ADLs.   Time 4   Period Weeks   Status On-going           OT Long Term Goals - 09/17/17 1458      OT LONG TERM GOAL #1   Title Pt/caregiver will be independent with updated HEP.--check LTGs 09/13/17   Time 8   Period Weeks   Status On-going     OT LONG TERM GOAL #2   Title Pt will be use LUE as nondominant assist at least 75% of the time for ADLs without cueing.   Time 8   Period Weeks   Status On-going  uses 75% x however needs prompting at times     OT LONG TERM GOAL #3   Title Pt will perform toileting mod I (including transfer).   Time 8   Period Weeks   Status On-going  supervision     OT LONG TERM GOAL #4   Title Pt will improve LUE coordination/control to improve score on box and blocks test by at least 12 with LUE.   Baseline 19 blocks   Time 8   Period Weeks   Status Not Met  18, 19 blocks     OT LONG TERM GOAL #5   Title Pt will perform simple home maintenance  task/snack prep in standing with set-up/supervision.   Time 8   Period Weeks   Status On-going  Pt is not perfoming consistely, she has made a sandwich in seated     OT LONG TERM GOAL #6   Title Pt will perform simple environmental scanning/navigation with supervision.  Time 8   Period Weeks   Status On-going               Plan - 09/17/17 1646    Clinical Impression Statement Pt is progressing towards long term goals.   Rehab Potential Good   Current Impairments/barriers affecting progress: cognitive deficits, impulsivity   OT Frequency 2x / week   OT Duration 8 weeks   OT Treatment/Interventions Self-care/ADL training;DME and/or AE instruction;Patient/family education;Therapeutic exercises;Balance training;Fluidtherapy;Moist Heat;Ultrasound;Therapeutic exercise;Therapeutic activities;Cryotherapy;Neuromuscular education;Functional Mobility Training;Passive range of motion;Cognitive remediation/compensation;Visual/perceptual remediation/compensation;Manual Therapy   Plan finish checking goals, renew in next several visits   OT Home Exercise Plan Education provided:  HEP for LUE control, coordination, and functional use   Consulted and Agree with Plan of Care Patient;Family member/caregiver   Family Member Consulted husband      Patient will benefit from skilled therapeutic intervention in order to improve the following deficits and impairments:  Decreased coordination, Decreased range of motion, Difficulty walking, Abnormal gait, Decreased safety awareness, Impaired sensation, Decreased knowledge of precautions, Decreased balance, Decreased knowledge of use of DME, Impaired UE functional use, Decreased cognition, Decreased mobility, Decreased strength, Impaired vision/preception, Impaired perceived functional ability  Visit Diagnosis: Other lack of coordination  Visuospatial deficit  Other disturbances of skin sensation    Problem List Patient Active Problem List    Diagnosis Date Noted  . Weakness with dizziness-  since [facility] and CVA 04/07/17 08/07/2017  . Disturbances of vision, late effect of stroke 08/06/2017  . Health education/counseling 08/05/2017  . High risk medications (not anticoagulants) long-term use 08/05/2017  . Neuritis-  R sided:  arm and leg/ body due to stroke 08/05/2017  . Elevated LDL cholesterol level 07/11/2017  . [employer] (NIH) Stroke Scale limb ataxia score 2, ataxia present in two limbs 06/25/2017  . 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 Elevated vitamin B12 level 05/30/2017  . Vitamin D deficiency 05/29/2017  . History of tobacco abuse-  30pk yr hx - quit 04/07/17 05/21/2017  . Gait disturbance, post-stroke 05/14/2017  . Benign essential HTN   . Vitreous hemorrhage of right eye ([place])   . Adjustment disorder with mixed anxiety and depressed mood   . 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 s/p SAH (subarachnoid hemorrhage) ([place]) 04/19/2017  . Basilar artery aneurysm ([place])   . Hypoxia   . Subarachnoid hemorrhage due to ruptured aneurysm ([place]) 04/07/2017  . CVA (cerebral vascular accident) ([place]) 04/07/2017  . Elevated gastrin level 01/25/2012  . Hypokalemia 01/21/2012  . Nausea & vomiting 01/20/2012  . Epigastric pain 01/20/2012  . Duodenal ulcer, acute with obstruction 10/17/2011  . S/P laparoscopic cholecystectomy 10/16/2011    Omolola Mittman 09/17/2017, 4:52 PM  [facility] [address] [place], [place], [address] Phone: [phone]   Fax:  [phone]  Name: ANGELISSE RISO MRN: [id number] Date of Birth: 03-Jun-1976

## 2017-09-17 NOTE — Telephone Encounter (Signed)
Patient notified of lab results. MPulliam, CMA/RT(R)  

## 2017-09-17 NOTE — Therapy (Signed)
[facility] [address] [place], [place], [address] Phone: [phone]   Fax:  [phone]  Physical Therapy Treatment  Patient Details  Name: [name] Barnett MRN: [id number] Date of Birth: September 20, 1976 Referring Provider: Erick Colace, MD  Encounter Date: 09/17/2017      PT End of Session - 09/17/17 1539    Visit Number 15   Number of Visits 30  per recertification   Date for PT Re-Evaluation 11/12/17  per recertification   Authorization Type BCBS   PT Start Time 1533   PT Stop Time 1615   PT Time Calculation (min) 42 min   Activity Tolerance Patient tolerated treatment well;Patient limited by fatigue   Behavior During Therapy [facility] for tasks assessed/performed      Past Medical History:  Diagnosis Date  . Duodenal obstruction   . GERD (gastroesophageal reflux disease)   . Hypertension   . Stroke [facility])    april 2018    Past Surgical History:  Procedure Laterality Date  . ABDOMINAL HYSTERECTOMY    . BALLOON DILATION  12/31/2011   Procedure: BALLOON DILATION;  Surgeon: Barrie Folk, MD;  Location: [facility] ENDOSCOPY;  Service: Endoscopy;  Laterality: N/A;  . CHOLECYSTECTOMY  07/28/11  . ESOPHAGOGASTRODUODENOSCOPY  10/17/2011   Procedure: ESOPHAGOGASTRODUODENOSCOPY (EGD);  Surgeon: Barrie Folk, MD;  Location: [facility] ENDOSCOPY;  Service: Endoscopy;  Laterality: N/A;  . IR ANGIO INTRA EXTRACRAN SEL INTERNAL CAROTID BILAT MOD SED  04/07/2017  . IR ANGIO VERTEBRAL SEL VERTEBRAL UNI R MOD SED  04/07/2017  . IR ANGIOGRAM FOLLOW UP STUDY  04/07/2017  . IR ANGIOGRAM FOLLOW UP STUDY  04/07/2017  . IR ANGIOGRAM FOLLOW UP STUDY  04/07/2017  . IR ANGIOGRAM FOLLOW UP STUDY  04/07/2017  . IR ANGIOGRAM FOLLOW UP STUDY  04/07/2017  . IR ANGIOGRAM SELECTIVE EACH ADDITIONAL VESSEL  04/07/2017  . IR TRANSCATH/EMBOLIZ  04/07/2017  . PARS PLANA VITRECTOMY Right 05/01/2017   Procedure: PARS PLANA VITRECTOMY WITH 25 GAUGE RIGHT EYE, endolaser  photocoaglation;  Surgeon: Carmela Rima, MD;  Location: [facility] OR;  Service: Ophthalmology;  Laterality: Right;  . RADIOLOGY WITH ANESTHESIA N/A 04/07/2017   Procedure: RADIOLOGY WITH ANESTHESIA;  Surgeon: Lisbeth Renshaw, MD;  Location: [facility] OR;  Service: Radiology;  Laterality: N/A;  . REPLACEMENT TOTAL KNEE BILATERAL  2004  . TEMPOROMANDIBULAR JOINT SURGERY     2 surgeries  . TUMOR REMOVAL    . VAGOTOMY  01/23/2012   Procedure: VAGOTOMY, antrectomy and BII;  Surgeon: Currie Paris, MD;  Location: [facility] OR;  Service: General;  Laterality: N/A;  Laparotomy with vagotomy.    Vitals:   09/17/17 1532  BP: 124/86        Subjective Assessment - 09/17/17 1532    Subjective Having a headache today. No falls.    Limitations Standing;Walking   Currently in Pain? Yes   Pain Score 7    Pain Location Head   Pain Descriptors / Indicators Headache   Pain Type Chronic pain   Pain Onset More than a month ago   Pain Frequency Constant  varies in intensity   Aggravating Factors  unsure   Pain Relieving Factors being busy            [facility] Adult PT Treatment/Exercise - 09/17/17 1540      Neuro Re-ed    Neuro Re-ed Details  Min assist for floor transfers for the following activities: Tall kneeling on red mat- mini squats x10reps with some  limitation with postural sway, assist for equal weight bearing.  Half kneeling with bil forearms propped on blue mat table: lifting knee of back leg up while pushing heel down for leg extension with assist for ankle stability and controlled movements x 10 reps,limitations due to weakness and fatigue. half kneeling on red mat: cues/assist for balance and finding balance, progressed to alternating arm raises x10 reps with ant/post weight shifting between reps for balance training/balance finding;  Quadruped: weightshift ant/post with emphasis on core stability throughout movements, progressing to sliding legs out for hip/knee extension, alternating x10 repseach  side limitations due to weakness and fatigue. Modified tandem stance with on foot on floor, opposite foot fwd on 4 ichn step with: min assist to find balance with cues for glut and core activation, progressed to alternating arm raises x10reps with each foot forward, limitations with weightshift to LLE needing cues/assist to address.             Balance Exercises - 09/17/17 1609      Balance Exercises: Standing   Standing Eyes Closed Wide (BOA);Narrow base of support (BOS);Head turns;Foam/compliant surface;Other reps (comment);30 secs;Limitations     Balance Exercises: Standing   Standing Eyes Closed Limitations on airex with chair in front with narrow base of support, anterior/posterior postural sway. EC no head movements, progressing to EC head movements left<>right, then up<>down with min assist for balance.               PT Short Term Goals - 09/13/17 1645      PT SHORT TERM GOAL #1   Title Pt will participate in further assessment of falls risk during gait with DGI   Time 4   Period Weeks   Status New   Target Date 10/13/17     PT SHORT TERM GOAL #2   Title Pt will decrease falls risk with gait as indicated by increase in gait velocity with LRAD to > or = 2.0 ft/sec   Baseline 1.78 ft/sec with RW   Time 4   Period Weeks   Status Revised   Target Date 10/13/17     PT SHORT TERM GOAL #3   Title Pt will decrease falls risk as indicated by increase in BERG balance score to > or = 45/56   Baseline 39/56   Time 4   Period Weeks   Status Revised   Target Date 10/13/17     PT SHORT TERM GOAL #4   Title Pt will ambulate >200' on indoor surfaces with cane (SPC with or without quad tip) and negotiate 4 stairs with one rail and cane, alternating sequence with min A    Time 4   Period Weeks   Status New   Target Date 10/13/17           PT Long Term Goals - 09/13/17 1650      PT LONG TERM GOAL #1   Title Pt and husband will demonstrate independence with HEP    Time 8   Period Weeks   Status On-going   Target Date 11/12/17     PT LONG TERM GOAL #2   Title Pt will demonstrate improved balance and decreased falls risk as indicated by BERG balance score of > or = 50/56   Time 8   Period Weeks   Status Achieved   Target Date 11/12/17     PT LONG TERM GOAL #3   Title Pt will ambulate with cane x 300' over paved outdoor surfaces (curbs  and inclines) with supervision; will negotiate 4 stairs with one rail and cane with alternating sequence with supervision   Time 8   Period Weeks   Status Revised   Target Date 11/12/17     PT LONG TERM GOAL #4   Title Pt will report 15% improvement in Neuro QOL-LE   Baseline 31.3%   Time 8   Period Weeks   Status On-going   Target Date 11/12/17     PT LONG TERM GOAL #5   Title Pt will decrease falls risk during gait in home/community as indicated by increase in gait velocity to > or = 2.5 ft/sec with LRAD   Time 8   Period Weeks   Status Revised   Target Date 11/12/17     PT LONG TERM GOAL #6   Title Pt will demonstrate decreased falls risk with gait as indicated by increase in DGI score by 4 points   Baseline TBD   Time 8   Period Weeks   Status New   Target Date 11/12/17        09/17/17 1539  Plan  Clinical Impression Statement Today's session continued to address weight shifting, balance and core/LE strengthening. Pt continues to be nervous with new tasks, stating several times "I can't do that", however would with encoragement/cues/assistance. Pt is progressing toward goals and should benefit from continued PT to progress toward unmet goals.   Pt will benefit from skilled therapeutic intervention in order to improve on the following deficits Abnormal gait;Decreased balance;Decreased cognition;Decreased coordination;Decreased strength;Difficulty walking;Dizziness;Impaired sensation;Impaired vision/preception;Pain;Decreased activity tolerance;Decreased mobility  Rehab Potential Good  PT Frequency  2x / week  PT Duration 8 weeks  PT Treatment/Interventions ADLs/Self Care Home Management;Aquatic Therapy;DME Instruction;Gait training;Stair training;Functional mobility training;Therapeutic activities;Therapeutic exercise;Balance training;Neuromuscular re-education;Patient/family education;Vestibular;Visual/perceptual remediation/compensation;Cognitive remediation;Orthotic Fit/Training  PT Next Visit Plan ASSESS VITALS AT [facility] VISIT.  Continue to work on weight shifting to improve turning/pivoting, SLS, partial tandem stance, taps to step.  Treadmill gait training.  Assess DGI when appropriate.  Consulted and Agree with Plan of Care Patient;Family member/caregiver  Family Member Consulted husband      Patient will benefit from skilled therapeutic intervention in order to improve the following deficits and impairments:  Abnormal gait, Decreased balance, Decreased cognition, Decreased coordination, Decreased strength, Difficulty walking, Dizziness, Impaired sensation, Impaired vision/preception, Pain, Decreased activity tolerance, Decreased mobility  Visit Diagnosis: Unsteadiness on feet  Other abnormalities of gait and mobility  Dizziness and giddiness     Problem List Patient Active Problem List   Diagnosis Date Noted  . Weakness with dizziness-  since [facility] and CVA 04/07/17 08/07/2017  . Disturbances of vision, late effect of stroke 08/06/2017  . Health education/counseling 08/05/2017  . High risk medications (not anticoagulants) long-term use 08/05/2017  . Neuritis-  R sided:  arm and leg/ body due to stroke 08/05/2017  . Elevated LDL cholesterol level 07/11/2017  . [employer] of [employer] (NIH) Stroke Scale limb ataxia score 2, ataxia present in two limbs 06/25/2017  . 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 Elevated vitamin B12 level 05/30/2017  . Vitamin D deficiency 05/29/2017  . History of tobacco abuse-  30pk yr hx - quit 04/07/17 05/21/2017  .  Gait disturbance, post-stroke 05/14/2017  . Benign essential HTN   . Vitreous hemorrhage of right eye ([name])   . Adjustment disorder with mixed anxiety and depressed mood   . 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 Terson syndrome of both  eyes ([name]) 04/23/2017  . s/p SAH (subarachnoid hemorrhage) ([name]) 04/19/2017  . Basilar artery aneurysm ([name])   . Hypoxia   . 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 CVA (cerebral vascular accident) ([name]) 04/07/2017  . Elevated gastrin level 01/25/2012  . Hypokalemia 01/21/2012  . Nausea & vomiting 01/20/2012  . Epigastric pain 01/20/2012  . Duodenal ulcer, acute with obstruction 10/17/2011  . S/P laparoscopic cholecystectomy 10/16/2011    [name] 09/18/2017, 5:07 PM  [facility] [address] [place], [place], [address] Phone: [phone]   Fax:  [phone]  Name: [name] Barnett MRN: [id number] Date of Birth: 09-02-1976

## 2017-09-19 ENCOUNTER — Ambulatory Visit: Payer: BLUE CROSS/BLUE SHIELD | Admitting: Occupational Therapy

## 2017-09-19 ENCOUNTER — Ambulatory Visit: Payer: BLUE CROSS/BLUE SHIELD | Admitting: Physical Therapy

## 2017-09-19 DIAGNOSIS — R208 Other disturbances of skin sensation: Secondary | ICD-10-CM

## 2017-09-19 DIAGNOSIS — I69054 Hemiplegia and hemiparesis following nontraumatic subarachnoid hemorrhage affecting left non-dominant side: Secondary | ICD-10-CM

## 2017-09-19 DIAGNOSIS — R41842 Visuospatial deficit: Secondary | ICD-10-CM

## 2017-09-19 DIAGNOSIS — R270 Ataxia, unspecified: Secondary | ICD-10-CM

## 2017-09-19 DIAGNOSIS — R2689 Other abnormalities of gait and mobility: Secondary | ICD-10-CM

## 2017-09-19 DIAGNOSIS — R2681 Unsteadiness on feet: Secondary | ICD-10-CM

## 2017-09-19 DIAGNOSIS — R278 Other lack of coordination: Secondary | ICD-10-CM

## 2017-09-19 DIAGNOSIS — I69018 Other symptoms and signs involving cognitive functions following nontraumatic subarachnoid hemorrhage: Secondary | ICD-10-CM

## 2017-09-19 DIAGNOSIS — R41840 Attention and concentration deficit: Secondary | ICD-10-CM

## 2017-09-19 DIAGNOSIS — R414 Neurologic neglect syndrome: Secondary | ICD-10-CM

## 2017-09-21 NOTE — Therapy (Signed)
[facility] [address] [place], [place], [address] Phone: [phone]   Fax:  [phone]  Occupational Therapy Treatment  Patient Details  Name: [name] Barnett MRN: [id number] Date of Birth: 1976/07/19 Referring Provider: Dr. Alysia Penna  Encounter Date: 09/19/2017    Past Medical History:  Diagnosis Date  . Duodenal obstruction   . GERD (gastroesophageal reflux disease)   . Hypertension   . Stroke [facility])    april 2018    Past Surgical History:  Procedure Laterality Date  . ABDOMINAL HYSTERECTOMY    . BALLOON DILATION  12/31/2011   Procedure: BALLOON DILATION;  Surgeon: Missy Sabins, MD;  Location: [facility] ENDOSCOPY;  Service: Endoscopy;  Laterality: N/A;  . CHOLECYSTECTOMY  07/28/11  . ESOPHAGOGASTRODUODENOSCOPY  10/17/2011   Procedure: ESOPHAGOGASTRODUODENOSCOPY (EGD);  Surgeon: Missy Sabins, MD;  Location: [facility] ENDOSCOPY;  Service: Endoscopy;  Laterality: N/A;  . IR ANGIO INTRA EXTRACRAN SEL INTERNAL CAROTID BILAT MOD SED  04/07/2017  . IR ANGIO VERTEBRAL SEL VERTEBRAL UNI R MOD SED  04/07/2017  . IR ANGIOGRAM FOLLOW UP STUDY  04/07/2017  . IR ANGIOGRAM FOLLOW UP STUDY  04/07/2017  . IR ANGIOGRAM FOLLOW UP STUDY  04/07/2017  . IR ANGIOGRAM FOLLOW UP STUDY  04/07/2017  . IR ANGIOGRAM FOLLOW UP STUDY  04/07/2017  . IR ANGIOGRAM SELECTIVE EACH ADDITIONAL VESSEL  04/07/2017  . IR TRANSCATH/EMBOLIZ  04/07/2017  . PARS PLANA VITRECTOMY Right 05/01/2017   Procedure: PARS PLANA VITRECTOMY WITH 25 GAUGE RIGHT EYE, endolaser photocoaglation;  Surgeon: Jalene Mullet, MD;  Location: [place];  Service: Ophthalmology;  Laterality: Right;  . RADIOLOGY WITH ANESTHESIA N/A 04/07/2017   Procedure: RADIOLOGY WITH ANESTHESIA;  Surgeon: Consuella Lose, MD;  Location: [place];  Service: Radiology;  Laterality: N/A;  . REPLACEMENT TOTAL KNEE BILATERAL  2004  . TEMPOROMANDIBULAR JOINT SURGERY     2 surgeries  . TUMOR REMOVAL    . VAGOTOMY   01/23/2012   Procedure: VAGOTOMY, antrectomy and BII;  Surgeon: Haywood Lasso, MD;  Location: [place];  Service: General;  Laterality: N/A;  Laparotomy with vagotomy.    There were no vitals filed for this visit.      Subjective Assessment - 09/21/17 1234    Subjective  Pt reports that she has optometrist 10/16.  Pt reports that she's using LUE more as nondominant assist   Patient is accompained by: Family member   Pertinent History Subarachnoid hemorrhage due to ruptured aneurysm, ataxia (L cerebellar); HTN, R side decr sensation, vertical diplopia/visual deficits, ataxia, Bilateral retinal hemorhage associated with SAH (with surgery on R eye in hospital and L approx 1 month ago)   Limitations visual deficits, fall risk, impulsivity/cognitive deficits   Patient Stated Goals be able to walk and use L arm   Currently in Pain? Yes   Pain Score 5    Pain Location Leg   Pain Orientation Left;Right   Pain Descriptors / Indicators Aching   Pain Type Acute pain   Pain Onset More than a month ago   Pain Frequency Intermittent   Aggravating Factors  ?exercise    Pain Relieving Factors unknown       In sitting, copying peg design by placing medium pegs in pegboard with LUE with mod difficulty/cueing to slow down, but 100% accuracy with incr time.  Then removing with LUE.  Functional reaching in sitting to retrieve/replace cylinder objects with min-mod cues to slow down and min difficulty with coordination.  In  supine, AAROM shoulder flex and chest press with ball with BUEs with min-mod cueing for control and to slow down.  In prone, scapular exercises with shoulders in extension with min cueing/facilitation for LUE.  In prone, wt. Bearing on elbows with mod cueing for technique in modified plank position for incr scapular/core stability.   Checked goals and discussed progress.  Renewal completed.                          OT Short Term Goals - 09/19/17 1454       OT SHORT TERM GOAL #1   Title Pt/caregiver will be independent with initial HEP.   Time 4   Period Weeks   Status Achieved  08/19/17:  with cueing from husband     OT [place] #2   Title Pt will be use LUE as nondominant assist at least 50% of the time for ADLs without cueing.   Time 4   Period Weeks   Status Achieved  08/19/17:  approx 40% of the time.  09/19/17:  90%     OT SHORT TERM GOAL #3   Title Pt will improve LUE coordination/control to improve score on box and blocks test by at least 6 with LUE.   Baseline 19 blocks   Time 4   Period Weeks   Status Achieved  08/22/17:  18, 20 blocks.  09/19/17:  25 blocks     OT SHORT TERM GOAL #4   Title Pt will perform simple home maintenance task/snack prep in sitting with set-up.   Time 4   Period Weeks   Status Achieved  08/19/17:  met in clinic with set-up and cueing/prompts for encouragement/to slow down     OT SHORT TERM GOAL #5   Title Pt/husband will verbalize understanding of visual compensation stategies for improved safety/incr ease with ADLs.   Time 4   Period Weeks   Status Achieved     Additional Short Term Goals   Additional Short Term Goals Yes     OT SHORT TERM GOAL #6   Title Pt will perform simple environmental scanning/navigation with CGA.--check STGs 10/18/17   Time 4   Period Weeks   Status New     OT SHORT TERM GOAL #7   Title Pt will complete functional task with LUE with no more than min cueing for impulsivity.     Time 4   Period Weeks   Status New   Target Date 10/18/17           OT Long Term Goals - 09/19/17 1457      OT LONG TERM GOAL #1   Title Pt/caregiver will be independent with updated HEP.--check LTGs 12/18/16   Time 8   Period Weeks   Status On-going     OT LONG TERM GOAL #2   Title Pt will be use LUE as nondominant assist at least 75% of the time for ADLs without cueing.   Time 8   Period Weeks   Status Achieved  uses 75% x however needs prompting at times.   09/19/17:  met per pt  90%     OT LONG TERM GOAL #3   Title Pt will perform toileting mod I (including transfer).   Time 8   Period Weeks   Status On-going  supervision.  09/19/17:  continues to need superivsion for mobility     OT LONG TERM GOAL #4   Title Pt will  improve LUE coordination/control to improve score on box and blocks test by at least 12 with LUE.   Baseline 19 blocks   Time 8   Period Weeks   Status On-going  18, 19 blocks.  09/19/17:  25 blocks     OT LONG TERM GOAL #5   Title Pt will perform simple home maintenance task/snack prep in standing with set-up/supervision.   Time 8   Period Weeks   Status On-going  Pt is not perfoming consistely, she has made a sandwich in seated     OT LONG TERM GOAL #6   Title Pt will perform simple environmental scanning/navigation with supervision.   Time 8   Period Weeks   Status On-going             Patient will benefit from skilled therapeutic intervention in order to improve the following deficits and impairments:  Decreased coordination, Decreased range of motion, Difficulty walking, Abnormal gait, Decreased safety awareness, Impaired sensation, Decreased knowledge of precautions, Decreased balance, Decreased knowledge of use of DME, Impaired UE functional use, Decreased cognition, Decreased mobility, Decreased strength, Impaired vision/preception, Impaired perceived functional ability  Visit Diagnosis: Other lack of coordination  Visuospatial deficit  Other disturbances of skin sensation  Attention and concentration deficit  Other abnormalities of gait and mobility  Unsteadiness on feet  Other symptoms and signs involving cognitive functions following nontraumatic subarachnoid hemorrhage  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 Weakness with dizziness-  since [facility] and CVA 04/07/17 08/07/2017  . Disturbances of vision, late effect of stroke 08/06/2017  . Health education/counseling 08/05/2017  . High risk medications (not anticoagulants) long-term use 08/05/2017  . Neuritis-  R sided:  arm and leg/ body due to stroke 08/05/2017  . Elevated LDL cholesterol level 07/11/2017  . [employer] of Health (NIH) Stroke Scale limb ataxia score 2, ataxia present in two limbs 06/25/2017  . 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 Elevated vitamin B12 level 05/30/2017  . Vitamin D deficiency 05/29/2017  . History of tobacco abuse-  30pk yr hx - quit 04/07/17 05/21/2017  . Gait disturbance, post-stroke 05/14/2017  . Benign essential HTN   . Vitreous hemorrhage of right eye ([place])   . Adjustment disorder with mixed anxiety and depressed mood   . 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 Terson syndrome of both eyes ([place]) 04/23/2017  . s/p SAH (subarachnoid hemorrhage) ([place]) 04/19/2017  . Basilar artery aneurysm ([place])   . Hypoxia   . Subarachnoid hemorrhage due to ruptured aneurysm ([place]) 04/07/2017  . CVA (cerebral vascular accident) ([place]) 04/07/2017  . Elevated gastrin level 01/25/2012  . Hypokalemia 01/21/2012  . Nausea & vomiting 01/20/2012  . Epigastric pain 01/20/2012  . Duodenal ulcer, acute with obstruction 10/17/2011  . S/P laparoscopic cholecystectomy 10/16/2011    [facility] 09/21/2017, 12:35 PM  [facility] [address] [place], [place], [address] Phone: [phone]   Fax:  [phone]  Name: SOPHIYA MORELLO MRN: [id number] Date of Birth: 08-22-1976   Vianne Bulls, OTR/L [facility] [address] [place], [place]  [address] [phone] phone [phone] 09/21/17 12:35 PM

## 2017-09-25 ENCOUNTER — Encounter: Payer: Self-pay | Admitting: Physical Therapy

## 2017-09-25 ENCOUNTER — Ambulatory Visit: Payer: BLUE CROSS/BLUE SHIELD | Admitting: Occupational Therapy

## 2017-09-25 ENCOUNTER — Ambulatory Visit: Payer: BLUE CROSS/BLUE SHIELD | Admitting: Physical Therapy

## 2017-09-25 ENCOUNTER — Encounter: Payer: Self-pay | Admitting: Occupational Therapy

## 2017-09-25 VITALS — BP 135/94 | HR 75

## 2017-09-25 DIAGNOSIS — R42 Dizziness and giddiness: Secondary | ICD-10-CM

## 2017-09-25 DIAGNOSIS — R260 Ataxic gait: Secondary | ICD-10-CM

## 2017-09-25 DIAGNOSIS — R278 Other lack of coordination: Secondary | ICD-10-CM

## 2017-09-25 DIAGNOSIS — R208 Other disturbances of skin sensation: Secondary | ICD-10-CM

## 2017-09-25 DIAGNOSIS — R2681 Unsteadiness on feet: Secondary | ICD-10-CM

## 2017-09-25 DIAGNOSIS — R414 Neurologic neglect syndrome: Secondary | ICD-10-CM

## 2017-09-25 DIAGNOSIS — R2689 Other abnormalities of gait and mobility: Secondary | ICD-10-CM

## 2017-09-25 DIAGNOSIS — R41840 Attention and concentration deficit: Secondary | ICD-10-CM

## 2017-09-25 DIAGNOSIS — R41842 Visuospatial deficit: Secondary | ICD-10-CM

## 2017-09-25 DIAGNOSIS — R270 Ataxia, unspecified: Secondary | ICD-10-CM

## 2017-09-25 DIAGNOSIS — I69054 Hemiplegia and hemiparesis following nontraumatic subarachnoid hemorrhage affecting left non-dominant side: Secondary | ICD-10-CM

## 2017-09-25 DIAGNOSIS — I69018 Other symptoms and signs involving cognitive functions following nontraumatic subarachnoid hemorrhage: Secondary | ICD-10-CM

## 2017-09-25 NOTE — Therapy (Signed)
[facility] [address] [place], [place], [address] Phone: [phone]   Fax:  [phone]  Occupational Therapy Treatment  Patient Details  Name: [name] Barnett MRN: [id number] Date of Birth: 1976-06-29 Referring Provider: Dr. Alysia Penna  Encounter Date: 09/25/2017      OT End of Session - 09/25/17 1708    Visit Number 15   Number of Visits 30   Date for OT Re-Evaluation 12/18/17   Authorization Type BCBS, no visit limit/auth req.   Authorization Time Period renewal completed 09/19/17 for 8 wks   OT Start Time 1403   OT Stop Time 1443   OT Time Calculation (min) 40 min   Activity Tolerance Patient tolerated treatment well   Behavior During Therapy [facility] for tasks assessed/performed      Past Medical History:  Diagnosis Date  . Duodenal obstruction   . GERD (gastroesophageal reflux disease)   . Hypertension   . Stroke [facility])    april 2018    Past Surgical History:  Procedure Laterality Date  . ABDOMINAL HYSTERECTOMY    . BALLOON DILATION  12/31/2011   Procedure: BALLOON DILATION;  Surgeon: Missy Sabins, MD;  Location[facility] ENDOSCOPY;  Service: Endoscopy;  Laterality: N/A;  . CHOLECYSTECTOMY  07/28/11  . ESOPHAGOGASTRODUODENOSCOPY  10/17/2011   Procedure: ESOPHAGOGASTRODUODENOSCOPY (EGD);  Surgeon: Missy Sabins, MD;  Location: [facility] ENDOSCOPY;  Service: Endoscopy;  Laterality: N/A;  . IR ANGIO INTRA EXTRACRAN SEL INTERNAL CAROTID BILAT MOD SED  04/07/2017  . IR ANGIO VERTEBRAL SEL VERTEBRAL UNI R MOD SED  04/07/2017  . IR ANGIOGRAM FOLLOW UP STUDY  04/07/2017  . IR ANGIOGRAM FOLLOW UP STUDY  04/07/2017  . IR ANGIOGRAM FOLLOW UP STUDY  04/07/2017  . IR ANGIOGRAM FOLLOW UP STUDY  04/07/2017  . IR ANGIOGRAM FOLLOW UP STUDY  04/07/2017  . IR ANGIOGRAM SELECTIVE EACH ADDITIONAL VESSEL  04/07/2017  . IR TRANSCATH/EMBOLIZ  04/07/2017  . PARS PLANA VITRECTOMY Right 05/01/2017   Procedure: PARS PLANA VITRECTOMY WITH 25 GAUGE  RIGHT EYE, endolaser photocoaglation;  Surgeon: Jalene Mullet, MD;  Location: [place];  Service: Ophthalmology;  Laterality: Right;  . RADIOLOGY WITH ANESTHESIA N/A 04/07/2017   Procedure: RADIOLOGY WITH ANESTHESIA;  Surgeon: Consuella Lose, MD;  Location: [place];  Service: Radiology;  Laterality: N/A;  . REPLACEMENT TOTAL KNEE BILATERAL  2004  . TEMPOROMANDIBULAR JOINT SURGERY     2 surgeries  . TUMOR REMOVAL    . VAGOTOMY  01/23/2012   Procedure: VAGOTOMY, antrectomy and BII;  Surgeon: Haywood Lasso, MD;  Location: [place];  Service: General;  Laterality: N/A;  Laparotomy with vagotomy.    There were no vitals filed for this visit.      Subjective Assessment - 09/25/17 1407    Subjective  I saw the eye doctor yesterday, and I am getting prisms in my glasses in two weeks   Patient is accompained by: Family member   Pertinent History Subarachnoid hemorrhage due to ruptured aneurysm, ataxia (L cerebellar); HTN, R side decr sensation, vertical diplopia/visual deficits, ataxia, Bilateral retinal hemorhage associated with SAH (with surgery on R eye in hospital and L approx 1 month ago)   Limitations visual deficits, fall risk, impulsivity/cognitive deficits   Patient Stated Goals be able to walk and use L arm   Currently in Pain? Yes   Pain Score 4    Pain Location Head   Pain Orientation Right   Pain Descriptors / Indicators Aching  Pain Type Chronic pain   Pain Onset More than a month ago   Pain Frequency Constant   Pain Relieving Factors excedrin                      OT Treatments/Exercises (OP) - 09/25/17 1445      Neurological Re-education Exercises   Other Exercises 1 Neuromuscular reeducation to address closed chain to open chain use of left arm in both seated and standing conditions.  Patient needing cueing for sufficient weight shift in sit to /from stand transition to safely change position without loss of balance.   With cueing and repetition, able to  safely stand and then have sufficient balance to use BUE's in functional activity.               Balance Exercises - 09/25/17 1348      Balance Exercises: Standing   SLS with Vectors Solid surface;Other reps (comment);Limitations   Stepping Strategy --   Other Standing Exercises standing with one foot fwd on 4 inch box, other on floor: alternaging UE raises x 5 each side with mod assist right foot fowd, min assist left foot fwd.,      Balance Exercises: Standing   SLS with Vectors Limitations 5 circle dots in a semi circular pattern:            OT Education - 09/25/17 1707    Education provided Yes   Education Details slow controlled movements during transition from sit to stand   Person(s) Educated Patient   Methods Explanation;Demonstration   Comprehension Need further instruction          OT Short Term Goals - 09/19/17 1454      OT [place] #1   Title Pt/caregiver will be independent with initial HEP.   Time 4   Period Weeks   Status Achieved  08/19/17:  with cueing from husband     OT [place] #2   Title Pt will be use LUE as nondominant assist at least 50% of the time for ADLs without cueing.   Time 4   Period Weeks   Status Achieved  08/19/17:  approx 40% of the time.  09/19/17:  90%     OT SHORT TERM GOAL #3   Title Pt will improve LUE coordination/control to improve score on box and blocks test by at least 6 with LUE.   Baseline 19 blocks   Time 4   Period Weeks   Status Achieved  08/22/17:  18, 20 blocks.  09/19/17:  25 blocks     OT SHORT TERM GOAL #4   Title Pt will perform simple home maintenance task/snack prep in sitting with set-up.   Time 4   Period Weeks   Status Achieved  08/19/17:  met in clinic with set-up and cueing/prompts for encouragement/to slow down     OT SHORT TERM GOAL #5   Title Pt/husband will verbalize understanding of visual compensation stategies for improved safety/incr ease with ADLs.   Time 4   Period  Weeks   Status Achieved     Additional Short Term Goals   Additional Short Term Goals Yes     OT SHORT TERM GOAL #6   Title Pt will perform simple environmental scanning/navigation with CGA.--check STGs 10/18/17   Time 4   Period Weeks   Status New     OT SHORT TERM GOAL #7   Title Pt will complete functional task with LUE  with no more than min cueing for impulsivity.     Time 4   Period Weeks   Status New   Target Date 10/18/17           OT Long Term Goals - 09/19/17 1457      OT LONG TERM GOAL #1   Title Pt/caregiver will be independent with updated HEP.--check LTGs 12/18/16   Time 8   Period Weeks   Status On-going     OT LONG TERM GOAL #2   Title Pt will be use LUE as nondominant assist at least 75% of the time for ADLs without cueing.   Time 8   Period Weeks   Status Achieved  uses 75% x however needs prompting at times.  09/19/17:  met per pt  90%     OT LONG TERM GOAL #3   Title Pt will perform toileting mod I (including transfer).   Time 8   Period Weeks   Status On-going  supervision.  09/19/17:  continues to need superivsion for mobility     OT LONG TERM GOAL #4   Title Pt will improve LUE coordination/control to improve score on box and blocks test by at least 12 with LUE.   Baseline 19 blocks   Time 8   Period Weeks   Status On-going  18, 19 blocks.  09/19/17:  25 blocks     OT LONG TERM GOAL #5   Title Pt will perform simple home maintenance task/snack prep in standing with set-up/supervision.   Time 8   Period Weeks   Status On-going  Pt is not perfoming consistely, she has made a sandwich in seated     OT LONG TERM GOAL #6   Title Pt will perform simple environmental scanning/navigation with supervision.   Time 8   Period Weeks   Status On-going               Plan - 09/25/17 1708    Clinical Impression Statement Patient has significant visual issues which impede functional mobility and performance of ADL, but will soon have  prism glasses that may aide in visual abilities.  Patient progressing with functional use of left arm in closed chain or partially closed chain conditions   Rehab Potential Good   Current Impairments/barriers affecting progress: cognitive deficits, impulsivity   OT Frequency 2x / week   OT Duration 8 weeks   OT Treatment/Interventions Self-care/ADL training;DME and/or AE instruction;Patient/family education;Therapeutic exercises;Balance training;Fluidtherapy;Moist Heat;Ultrasound;Therapeutic exercise;Therapeutic activities;Cryotherapy;Neuromuscular education;Functional Mobility Training;Passive range of motion;Cognitive remediation/compensation;Visual/perceptual remediation/compensation;Manual Therapy   Plan functional mobility, partial closed to open chain in left arm   OT Home Exercise Plan Education provided:  HEP for LUE control, coordination, and functional use   Consulted and Agree with Plan of Care Patient;Family member/caregiver      Patient will benefit from skilled therapeutic intervention in order to improve the following deficits and impairments:  Decreased coordination, Decreased range of motion, Difficulty walking, Abnormal gait, Decreased safety awareness, Impaired sensation, Decreased knowledge of precautions, Decreased balance, Decreased knowledge of use of DME, Impaired UE functional use, Decreased cognition, Decreased mobility, Decreased strength, Impaired vision/preception, Impaired perceived functional ability  Visit Diagnosis: Other lack of coordination  Visuospatial deficit  Other disturbances of skin sensation  Attention and concentration deficit  Unsteadiness on feet  Other symptoms and signs involving cognitive functions following nontraumatic subarachnoid hemorrhage  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 Weakness with dizziness-  since [facility] and CVA 04/07/17 08/07/2017  . Disturbances of vision, late effect of stroke 08/06/2017  . Health education/counseling 08/05/2017  . High risk medications (not anticoagulants) long-term use 08/05/2017  . Neuritis-  R sided:  arm and leg/ body due to stroke 08/05/2017  . Elevated LDL cholesterol level 07/11/2017  . [employer] of Health (NIH) Stroke Scale limb ataxia score 2, ataxia present in two limbs 06/25/2017  . 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 Elevated vitamin B12 level 05/30/2017  . Vitamin D deficiency 05/29/2017  . History of tobacco abuse-  30pk yr hx - quit 04/07/17 05/21/2017  . Gait disturbance, post-stroke 05/14/2017  . Benign essential HTN   . Vitreous hemorrhage of right eye ([place])   . Adjustment disorder with mixed anxiety and depressed mood   . 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 s/p SAH (subarachnoid hemorrhage) ([place]) 04/19/2017  . 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 Hypoxia   . Subarachnoid hemorrhage due to ruptured aneurysm ([place]) 04/07/2017  . CVA (cerebral vascular accident) ([place]) 04/07/2017  . Elevated gastrin level 01/25/2012  . Hypokalemia 01/21/2012  . Nausea & vomiting 01/20/2012  . Epigastric pain 01/20/2012  . Duodenal ulcer, acute with obstruction 10/17/2011  . S/P laparoscopic cholecystectomy 10/16/2011    Mariah Milling, OTR/L 09/25/2017, 5:11 PM  [facility] [address] [place], [place], [address] Phone: [phone]   Fax:  [phone]  Name: [name] Barnett MRN: [id number] Date of Birth: 03-Aug-1976

## 2017-09-27 ENCOUNTER — Ambulatory Visit: Payer: BLUE CROSS/BLUE SHIELD | Admitting: Physical Therapy

## 2017-09-27 ENCOUNTER — Encounter: Payer: BLUE CROSS/BLUE SHIELD | Admitting: Occupational Therapy

## 2017-09-27 ENCOUNTER — Encounter: Payer: Self-pay | Admitting: Physical Therapy

## 2017-09-27 VITALS — BP 120/74

## 2017-09-27 DIAGNOSIS — R2689 Other abnormalities of gait and mobility: Secondary | ICD-10-CM

## 2017-09-27 DIAGNOSIS — R260 Ataxic gait: Secondary | ICD-10-CM

## 2017-09-27 DIAGNOSIS — R42 Dizziness and giddiness: Secondary | ICD-10-CM

## 2017-09-27 DIAGNOSIS — R2681 Unsteadiness on feet: Secondary | ICD-10-CM

## 2017-09-27 DIAGNOSIS — R278 Other lack of coordination: Secondary | ICD-10-CM

## 2017-09-27 NOTE — Therapy (Signed)
[facility] [address] [place], [place], [address] Phone: [phone]   Fax:  [phone]  Physical Therapy Treatment  Patient Details  Name: [name] Barnett MRN: [id number] Date of Birth: [birth year] Referring Provider: Erick Colace, MD  Encounter Date: 09/25/2017     09/25/17 1322  PT Visits / Re-Eval  Visit Number 16  Number of Visits 30 (per recertification)  Date for PT Re-Evaluation 11/12/17 (per recertification)  Authorization  Authorization Type BCBS  PT Time Calculation  PT Start Time 1317  PT Stop Time 1400  PT Time Calculation (min) 43 min  PT - End of Session  Equipment Utilized During Treatment Gait belt  Activity Tolerance Patient tolerated treatment well  Behavior During Therapy [facility] for tasks assessed/performed    Past Medical History:  Diagnosis Date  . Duodenal obstruction   . GERD (gastroesophageal reflux disease)   . Hypertension   . Stroke [facility])    april 2018    Past Surgical History:  Procedure Laterality Date  . ABDOMINAL HYSTERECTOMY    . BALLOON DILATION  12/31/2011   Procedure: BALLOON DILATION;  Surgeon: Barrie Folk, MD;  Location: [facility] ENDOSCOPY;  Service: Endoscopy;  Laterality: N/A;  . CHOLECYSTECTOMY  07/28/11  . ESOPHAGOGASTRODUODENOSCOPY  10/17/2011   Procedure: ESOPHAGOGASTRODUODENOSCOPY (EGD);  Surgeon: Barrie Folk, MD;  Location: [facility] ENDOSCOPY;  Service: Endoscopy;  Laterality: N/A;  . IR ANGIO INTRA EXTRACRAN SEL INTERNAL CAROTID BILAT MOD SED  04/07/2017  . IR ANGIO VERTEBRAL SEL VERTEBRAL UNI R MOD SED  04/07/2017  . IR ANGIOGRAM FOLLOW UP STUDY  04/07/2017  . IR ANGIOGRAM FOLLOW UP STUDY  04/07/2017  . IR ANGIOGRAM FOLLOW UP STUDY  04/07/2017  . IR ANGIOGRAM FOLLOW UP STUDY  04/07/2017  . IR ANGIOGRAM FOLLOW UP STUDY  04/07/2017  . IR ANGIOGRAM SELECTIVE EACH ADDITIONAL VESSEL  04/07/2017  . IR TRANSCATH/EMBOLIZ  04/07/2017  . PARS PLANA VITRECTOMY Right 05/01/2017    Procedure: PARS PLANA VITRECTOMY WITH 25 GAUGE RIGHT EYE, endolaser photocoaglation;  Surgeon: Carmela Rima, MD;  Location[facility] OR;  Service: Ophthalmology;  Laterality: Right;  . RADIOLOGY WITH ANESTHESIA N/A 04/07/2017   Procedure: RADIOLOGY WITH ANESTHESIA;  Surgeon: Lisbeth Renshaw, MD;  Location: [facility] OR;  Service: Radiology;  Laterality: N/A;  . REPLACEMENT TOTAL KNEE BILATERAL  2004  . TEMPOROMANDIBULAR JOINT SURGERY     2 surgeries  . TUMOR REMOVAL    . VAGOTOMY  01/23/2012   Procedure: VAGOTOMY, antrectomy and BII;  Surgeon: Currie Paris, MD;  Location: [facility] OR;  Service: General;  Laterality: N/A;  Laparotomy with vagotomy.    Vitals:   09/25/17 1320  BP: (!) 135/94  Pulse: 75     09/25/17 1630  Symptoms/Limitations  Subjective Having a headache today. No falls. Reports she saw Dr. Dione Booze and has been fitted for her prism lenses. She is hopeful to have them in ~2 weeks.   Patient is accompained by: Family member (daughter in Social worker)  Limitations Standing;Walking  Pain Assessment  Currently in Pain? Yes  Pain Score 4  Pain Location Head  Pain Orientation Right  Pain Descriptors / Indicators Aching;Headache  Pain Type Chronic pain  Pain Onset More than a month ago  Pain Frequency Constant  Aggravating Factors  increased concentration, mostly states "It's always there"  Pain Relieving Factors excedrin       09/25/17 1323  Transfers  Transfers Sit to Stand;Stand to Sit  Sit to Stand 4: Min  guard;With upper extremity assist;From chair/3-in-1;From bed  Stand to Sit 4: Min assist;With upper extremity assist;To chair/3-in-1;To bed  Ambulation/Gait  Ambulation/Gait Yes  Ambulation/Gait Assistance 4: Min guard;5: Supervision  Ambulation/Gait Assistance Details RW used from mat to table, then used straight cane with rubber quad tip for 2 laps around large rectangular table. intermittent touch with other hand to table for balance with min assist from PTA. cues on posture,  step position and step length needed. RW used for return to mat table. BP 118/82 after gait.   Ambulation Distance (Feet) (see above)  Assistive device Rolling walker;Straight cane  Gait Pattern Step-through pattern;Ataxic  Ambulation Surface Level;Indoor  Neuro Re-ed   Neuro Re-ed Details  on red mat on floor next to blue mat: tall kneeling with no UE support:  mini squats x 10 reps with emphasis on equal LE weight bearing, lateral walking along length of mat with up to min assist for baance and cues to "lift" legs with advancement x 2 laps each way, then fwd/bwd walking along length of mat x 2 reps with min gaurd assist fwd and up to min assist for backwards, cues on posture, weight shifting and to lift legs with advacement.        09/25/17 1348  Balance Exercises: Standing  SLS with Vectors Solid surface;Other reps (comment);Limitations  Other Standing Exercises standing with one foot fwd on 4 inch box, other on floor: alternaging UE raises x 5 each side with mod assist right foot fowd, min assist left foot fwd.,   Balance Exercises: Standing  SLS with Vectors Limitations 5 circle dots in a semi circular pattern: toe taps to each x 4 laps each way with each foot. min to mod assist for balance with cues on posture, stance position and weight shifting with each foot motion.            PT Short Term Goals - 09/13/17 1645      PT SHORT TERM GOAL #1   Title Pt will participate in further assessment of falls risk during gait with DGI   Time 4   Period Weeks   Status New   Target Date 10/13/17     PT SHORT TERM GOAL #2   Title Pt will decrease falls risk with gait as indicated by increase in gait velocity with LRAD to > or = 2.0 ft/sec   Baseline 1.78 ft/sec with RW   Time 4   Period Weeks   Status Revised   Target Date 10/13/17     PT SHORT TERM GOAL #3   Title Pt will decrease falls risk as indicated by increase in BERG balance score to > or = 45/56   Baseline 39/56   Time 4    Period Weeks   Status Revised   Target Date 10/13/17     PT SHORT TERM GOAL #4   Title Pt will ambulate >200' on indoor surfaces with cane (SPC with or without quad tip) and negotiate 4 stairs with one rail and cane, alternating sequence with min A    Time 4   Period Weeks   Status New   Target Date 10/13/17           PT Long Term Goals - 09/13/17 1650      PT LONG TERM GOAL #1   Title Pt and husband will demonstrate independence with HEP   Time 8   Period Weeks   Status On-going   Target Date 11/12/17  PT LONG TERM GOAL #2   Title Pt will demonstrate improved balance and decreased falls risk as indicated by BERG balance score of > or = 50/56   Time 8   Period Weeks   Status Achieved   Target Date 11/12/17     PT LONG TERM GOAL #3   Title Pt will ambulate with cane x 300' over paved outdoor surfaces (curbs and inclines) with supervision; will negotiate 4 stairs with one rail and cane with alternating sequence with supervision   Time 8   Period Weeks   Status Revised   Target Date 11/12/17     PT LONG TERM GOAL #4   Title Pt will report 15% improvement in Neuro QOL-LE   Baseline 31.3%   Time 8   Period Weeks   Status On-going   Target Date 11/12/17     PT LONG TERM GOAL #5   Title Pt will decrease falls risk during gait in home/community as indicated by increase in gait velocity to > or = 2.5 ft/sec with LRAD   Time 8   Period Weeks   Status Revised   Target Date 11/12/17     PT LONG TERM GOAL #6   Title Pt will demonstrate decreased falls risk with gait as indicated by increase in DGI score by 4 points   Baseline TBD   Time 8   Period Weeks   Status New   Target Date 11/12/17        09/25/17 1322  Plan  Clinical Impression Statement Today's skilled session initiated gait with straigth cane (with rubber quad tip) with min assit and other UE support needed at times. Remainder of session continued to address strengthening and   Pt will benefit  from skilled therapeutic intervention in order to improve on the following deficits Abnormal gait;Decreased balance;Decreased cognition;Decreased coordination;Decreased strength;Difficulty walking;Dizziness;Impaired sensation;Impaired vision/preception;Pain;Decreased activity tolerance;Decreased mobility  Rehab Potential Good  PT Frequency 2x / week  PT Duration 8 weeks  PT Treatment/Interventions ADLs/Self Care Home Management;Aquatic Therapy;DME Instruction;Gait training;Stair training;Functional mobility training;Therapeutic activities;Therapeutic exercise;Balance training;Neuromuscular re-education;Patient/family education;Vestibular;Visual/perceptual remediation/compensation;Cognitive remediation;Orthotic Fit/Training  PT Next Visit Plan ASSESS VITALS AT [facility] VISIT (Manual cuff works better). continue to work on gait with cane. standing balance with emphasis on weight shifting, SLS and modified tandem standing. assess DGI when appropriate.   Consulted and Agree with Plan of Care Patient;Family member/caregiver  Family Member Consulted husband          Patient will benefit from skilled therapeutic intervention in order to improve the following deficits and impairments:  Abnormal gait, Decreased balance, Decreased cognition, Decreased coordination, Decreased strength, Difficulty walking, Dizziness, Impaired sensation, Impaired vision/preception, Pain, Decreased activity tolerance, Decreased mobility  Visit Diagnosis: Unsteadiness on feet  Other abnormalities of gait and mobility  Dizziness and giddiness  Ataxic gait  Other lack of coordination     Problem List Patient Active Problem List   Diagnosis Date Noted  . Weakness with dizziness-  since [facility] and CVA 04/07/17 08/07/2017  . Disturbances of vision, late effect of stroke 08/06/2017  . Health education/counseling 08/05/2017  . High risk medications (not anticoagulants) long-term use 08/05/2017  . Neuritis-  R sided:  arm  and leg/ body due to stroke 08/05/2017  . Elevated LDL cholesterol level 07/11/2017  . [employer] of [employer] (NIH) Stroke Scale limb ataxia score 2, ataxia present in two limbs 06/25/2017  . 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 Elevated vitamin B12 level 05/30/2017  .  Vitamin D deficiency 05/29/2017  . History of tobacco abuse-  30pk yr hx - quit 04/07/17 05/21/2017  . Gait disturbance, post-stroke 05/14/2017  . Benign essential HTN   . Vitreous hemorrhage of right eye ([name])   . Adjustment disorder with mixed anxiety and depressed mood   . 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 s/p SAH (subarachnoid hemorrhage) ([name]) 04/19/2017  . Basilar artery aneurysm ([name])   . Hypoxia   . 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 CVA (cerebral vascular accident) ([name]) 04/07/2017  . Elevated gastrin level 01/25/2012  . Hypokalemia 01/21/2012  . Nausea & vomiting 01/20/2012  . Epigastric pain 01/20/2012  . Duodenal ulcer, acute with obstruction 10/17/2011  . S/P laparoscopic cholecystectomy 10/16/2011    Sallyanne KusterKathy Delrae Hagey, PTA, [facility] [address], [address] [place], [place] [address] [phone] 09/27/17, 12:20 PM   Name: [name] Barnett MRN: [id number] Date of Birth: 06/08/1976

## 2017-09-29 NOTE — Therapy (Signed)
[facility] [address] [place], [place], [address] Phone: [phone]   Fax:  [phone]  Physical Therapy Treatment  Patient Details  Name: [name] Barnett MRN: [id number] Date of Birth: September 23, 1976 Referring Provider: Erick Colace, MD  Encounter Date: 09/27/2017   09/27/17 1621  PT Visits / Re-Eval  Visit Number 17  Number of Visits 30 (per recertification)  Date for PT Re-Evaluation 11/12/17 (per recertification)  Authorization  Authorization Type BCBS  PT Time Calculation  PT Start Time 1619  PT Stop Time 1700  PT Time Calculation (min) 41 min  PT - End of Session  Equipment Utilized During Treatment Gait belt  Activity Tolerance Patient tolerated treatment well  Behavior During Therapy [facility] for tasks assessed/performed     Past Medical History:  Diagnosis Date  . Duodenal obstruction   . GERD (gastroesophageal reflux disease)   . Hypertension   . Stroke [facility])    april 2018    Past Surgical History:  Procedure Laterality Date  . ABDOMINAL HYSTERECTOMY    . BALLOON DILATION  12/31/2011   Procedure: BALLOON DILATION;  Surgeon: Barrie Folk, MD;  Location: [facility] ENDOSCOPY;  Service: Endoscopy;  Laterality: N/A;  . CHOLECYSTECTOMY  07/28/11  . ESOPHAGOGASTRODUODENOSCOPY  10/17/2011   Procedure: ESOPHAGOGASTRODUODENOSCOPY (EGD);  Surgeon: Barrie Folk, MD;  Location: [facility] ENDOSCOPY;  Service: Endoscopy;  Laterality: N/A;  . IR ANGIO INTRA EXTRACRAN SEL INTERNAL CAROTID BILAT MOD SED  04/07/2017  . IR ANGIO VERTEBRAL SEL VERTEBRAL UNI R MOD SED  04/07/2017  . IR ANGIOGRAM FOLLOW UP STUDY  04/07/2017  . IR ANGIOGRAM FOLLOW UP STUDY  04/07/2017  . IR ANGIOGRAM FOLLOW UP STUDY  04/07/2017  . IR ANGIOGRAM FOLLOW UP STUDY  04/07/2017  . IR ANGIOGRAM FOLLOW UP STUDY  04/07/2017  . IR ANGIOGRAM SELECTIVE EACH ADDITIONAL VESSEL  04/07/2017  . IR TRANSCATH/EMBOLIZ  04/07/2017  . PARS PLANA VITRECTOMY Right 05/01/2017   Procedure: PARS PLANA VITRECTOMY WITH 25 GAUGE RIGHT EYE, endolaser photocoaglation;  Surgeon: Carmela Rima, MD;  Location: [facility] OR;  Service: Ophthalmology;  Laterality: Right;  . RADIOLOGY WITH ANESTHESIA N/A 04/07/2017   Procedure: RADIOLOGY WITH ANESTHESIA;  Surgeon: Lisbeth Renshaw, MD;  Location[facility] OR;  Service: Radiology;  Laterality: N/A;  . REPLACEMENT TOTAL KNEE BILATERAL  2004  . TEMPOROMANDIBULAR JOINT SURGERY     2 surgeries  . TUMOR REMOVAL    . VAGOTOMY  01/23/2012   Procedure: VAGOTOMY, antrectomy and BII;  Surgeon: Currie Paris, MD;  Location: [facility] OR;  Service: General;  Laterality: N/A;  Laparotomy with vagotomy.    Vitals:   09/27/17 1620  BP: 120/74    09/27/17 1620  Symptoms/Limitations  Subjective No new compaints. No falls to report.   Patient is accompained by: Family member (husband)  Limitations Standing;Walking  Pain Assessment  Currently in Pain? Yes  Pain Score 4  Pain Location Head  Pain Descriptors / Indicators Aching;Headache  Pain Type Chronic pain  Pain Onset More than a month ago  Pain Frequency Constant  Aggravating Factors  "it's always there"  Pain Relieving Factors exedrin, being busy/distracted       09/27/17 1624  Transfers  Transfers Sit to Stand;Stand to Sit  Sit to Stand 4: Min guard;With upper extremity assist;From chair/3-in-1;From bed  Stand to Sit 4: Min assist;With upper extremity assist;To chair/3-in-1;To bed  Ambulation/Gait  Ambulation/Gait Yes  Ambulation/Gait Assistance 4: Min guard;4: Min assist  Ambulation/Gait Assistance Details cues  on posture, sequencing, weight shifitng and cane placement with gait. trialed HHA at end with improved step through pattern and step placement with pt using PTA feet as guides   Ambulation Distance (Feet) 36 Feet (x1, 60 x1 w/standing rest half way, 15 x1 w/bil HHA)  Assistive device Straight cane (with rubber quad tip)  Gait Pattern Step-through pattern;Ataxic  Ambulation  Surface Level;Indoor      09/27/17 1642  Balance Exercises: Standing  Standing Eyes Closed Foam/compliant surface;Other reps (comment);20 secs;Limitations  SLS with Vectors Solid surface;Other reps (comment);Limitations  Balance Exercises: Standing  SLS with Vectors Limitations 5 foam bubbles on floor in semi circle pattern:  toe taps to each and back x 3 full laps with each foot. cues to slow down and on weight shifting to assist with balance  Standing Eyes Closed Limitations on airex with chair in front with narrow base of support: EC no head movements with min assist for balance; standing on floor with one foot foward on airex, other on floor: worked on anterior/posterior postural sway/weight shifitng, progressing to finding her stability point then pt performing alternating UE raises. min to mod assist needed for balance.             PT Short Term Goals - 09/13/17 1645      PT SHORT TERM GOAL #1   Title Pt will participate in further assessment of falls risk during gait with DGI   Time 4   Period Weeks   Status New   Target Date 10/13/17     PT SHORT TERM GOAL #2   Title Pt will decrease falls risk with gait as indicated by increase in gait velocity with LRAD to > or = 2.0 ft/sec   Baseline 1.78 ft/sec with RW   Time 4   Period Weeks   Status Revised   Target Date 10/13/17     PT SHORT TERM GOAL #3   Title Pt will decrease falls risk as indicated by increase in BERG balance score to > or = 45/56   Baseline 39/56   Time 4   Period Weeks   Status Revised   Target Date 10/13/17     PT SHORT TERM GOAL #4   Title Pt will ambulate >200' on indoor surfaces with cane (SPC with or without quad tip) and negotiate 4 stairs with one rail and cane, alternating sequence with min A    Time 4   Period Weeks   Status New   Target Date 10/13/17           PT Long Term Goals - 09/13/17 1650      PT LONG TERM GOAL #1   Title Pt and husband will demonstrate independence with  HEP   Time 8   Period Weeks   Status On-going   Target Date 11/12/17     PT LONG TERM GOAL #2   Title Pt will demonstrate improved balance and decreased falls risk as indicated by BERG balance score of > or = 50/56   Time 8   Period Weeks   Status Achieved   Target Date 11/12/17     PT LONG TERM GOAL #3   Title Pt will ambulate with cane x 300' over paved outdoor surfaces (curbs and inclines) with supervision; will negotiate 4 stairs with one rail and cane with alternating sequence with supervision   Time 8   Period Weeks   Status Revised   Target Date 11/12/17     PT LONG  TERM GOAL #4   Title Pt will report 15% improvement in Neuro QOL-LE   Baseline 31.3%   Time 8   Period Weeks   Status On-going   Target Date 11/12/17     PT LONG TERM GOAL #5   Title Pt will decrease falls risk during gait in home/community as indicated by increase in gait velocity to > or = 2.5 ft/sec with LRAD   Time 8   Period Weeks   Status Revised   Target Date 11/12/17     PT LONG TERM GOAL #6   Title Pt will demonstrate decreased falls risk with gait as indicated by increase in DGI score by 4 points   Baseline TBD   Time 8   Period Weeks   Status New   Target Date 11/12/17        09/27/17 1621  Plan  Clinical Impression Statement Today's skilled session continued to address gait with cane vs no AD and high level balance activities working on balance reactions and weight shifting. No issues reported in session. Pt is progressing toward goals and should benefit from continued PT to progress toward unmet goals   Pt will benefit from skilled therapeutic intervention in order to improve on the following deficits Abnormal gait;Decreased balance;Decreased cognition;Decreased coordination;Decreased strength;Difficulty walking;Dizziness;Impaired sensation;Impaired vision/preception;Pain;Decreased activity tolerance;Decreased mobility  Rehab Potential Good  PT Frequency 2x / week  PT Duration 8 weeks   PT Treatment/Interventions ADLs/Self Care Home Management;Aquatic Therapy;DME Instruction;Gait training;Stair training;Functional mobility training;Therapeutic activities;Therapeutic exercise;Balance training;Neuromuscular re-education;Patient/family education;Vestibular;Visual/perceptual remediation/compensation;Cognitive remediation;Orthotic Fit/Training  PT Next Visit Plan ASSESS VITALS AT [facility] VISIT (Manual cuff works better). continue to work on gait with cane. standing balance with emphasis on weight shifting, SLS and modified tandem standing. assess DGI when appropriate.   Consulted and Agree with Plan of Care Patient;Family member/caregiver  Family Member Consulted husband       Patient will benefit from skilled therapeutic intervention in order to improve the following deficits and impairments:  Abnormal gait, Decreased balance, Decreased cognition, Decreased coordination, Decreased strength, Difficulty walking, Dizziness, Impaired sensation, Impaired vision/preception, Pain, Decreased activity tolerance, Decreased mobility  Visit Diagnosis: Unsteadiness on feet  Other abnormalities of gait and mobility  Dizziness and giddiness  Ataxic gait  Other lack of coordination     Problem List Patient Active Problem List   Diagnosis Date Noted  . Weakness with dizziness-  since [facility] and CVA 04/07/17 08/07/2017  . Disturbances of vision, late effect of stroke 08/06/2017  . Health education/counseling 08/05/2017  . High risk medications (not anticoagulants) long-term use 08/05/2017  . Neuritis-  R sided:  arm and leg/ body due to stroke 08/05/2017  . Elevated LDL cholesterol level 07/11/2017  . [employer] ([employer]) Stroke Scale limb ataxia score 2, ataxia present in two limbs 06/25/2017  . 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 Elevated vitamin B12 level 05/30/2017  . Vitamin D deficiency 05/29/2017  . History of tobacco abuse-  30pk yr hx -  quit 04/07/17 05/21/2017  . Gait disturbance, post-stroke 05/14/2017  . Benign essential HTN   . Vitreous hemorrhage of right eye ([name])   . Adjustment disorder with mixed anxiety and depressed mood   . 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 s/p SAH (subarachnoid hemorrhage) ([name]) 04/19/2017  . Basilar artery aneurysm ([name])   . Hypoxia   . Subarachnoid hemorrhage due to ruptured aneurysm ([name])  04/07/2017  . CVA (cerebral vascular accident) ([name]) 04/07/2017  . Elevated gastrin level 01/25/2012  . Hypokalemia 01/21/2012  . Nausea & vomiting 01/20/2012  . Epigastric pain 01/20/2012  . Duodenal ulcer, acute with obstruction 10/17/2011  . S/P laparoscopic cholecystectomy 10/16/2011    [name], PTA, [facility] [address], [address] [place], [place] [address] [phone] 09/29/17, 9:42 PM   Name: [name] Barnett MRN: [id number] Date of Birth: 02/18/1976

## 2017-09-30 ENCOUNTER — Encounter: Payer: Self-pay | Admitting: Physical Therapy

## 2017-09-30 ENCOUNTER — Ambulatory Visit: Payer: BLUE CROSS/BLUE SHIELD | Admitting: Occupational Therapy

## 2017-09-30 ENCOUNTER — Ambulatory Visit: Payer: BLUE CROSS/BLUE SHIELD | Admitting: Physical Therapy

## 2017-09-30 VITALS — BP 120/80

## 2017-09-30 DIAGNOSIS — R42 Dizziness and giddiness: Secondary | ICD-10-CM

## 2017-09-30 DIAGNOSIS — R41842 Visuospatial deficit: Secondary | ICD-10-CM

## 2017-09-30 DIAGNOSIS — R278 Other lack of coordination: Secondary | ICD-10-CM | POA: Diagnosis not present

## 2017-09-30 DIAGNOSIS — R2681 Unsteadiness on feet: Secondary | ICD-10-CM

## 2017-09-30 DIAGNOSIS — I69054 Hemiplegia and hemiparesis following nontraumatic subarachnoid hemorrhage affecting left non-dominant side: Secondary | ICD-10-CM

## 2017-09-30 DIAGNOSIS — R270 Ataxia, unspecified: Secondary | ICD-10-CM

## 2017-09-30 DIAGNOSIS — R41840 Attention and concentration deficit: Secondary | ICD-10-CM

## 2017-09-30 DIAGNOSIS — R2689 Other abnormalities of gait and mobility: Secondary | ICD-10-CM

## 2017-09-30 DIAGNOSIS — R260 Ataxic gait: Secondary | ICD-10-CM

## 2017-09-30 DIAGNOSIS — R414 Neurologic neglect syndrome: Secondary | ICD-10-CM

## 2017-09-30 DIAGNOSIS — R208 Other disturbances of skin sensation: Secondary | ICD-10-CM

## 2017-09-30 DIAGNOSIS — I69018 Other symptoms and signs involving cognitive functions following nontraumatic subarachnoid hemorrhage: Secondary | ICD-10-CM

## 2017-09-30 NOTE — Therapy (Signed)
[facility] [address] [place], [place], [address] Phone: [phone]   Fax:  [phone]  Physical Therapy Treatment  Patient Details  Name: [name] Barnett MRN: [id number] Date of Birth: [birth year] Referring Provider: Erick Colace, MD  Encounter Date: 09/30/2017      PT End of Session - 09/30/17 1604    Visit Number 18   Number of Visits 30  per recertification   Date for PT Re-Evaluation 11/12/17  per recertification   Authorization Type BCBS   PT Start Time 1404   PT Stop Time 1445   PT Time Calculation (min) 41 min   Activity Tolerance Patient tolerated treatment well   Behavior During Therapy [facility] for tasks assessed/performed      Past Medical History:  Diagnosis Date  . Duodenal obstruction   . GERD (gastroesophageal reflux disease)   . Hypertension   . Stroke [facility])    april 2018    Past Surgical History:  Procedure Laterality Date  . ABDOMINAL HYSTERECTOMY    . BALLOON DILATION  12/31/2011   Procedure: BALLOON DILATION;  Surgeon: Barrie Folk, MD;  Location: [facility] ENDOSCOPY;  Service: Endoscopy;  Laterality: N/A;  . CHOLECYSTECTOMY  07/28/11  . ESOPHAGOGASTRODUODENOSCOPY  10/17/2011   Procedure: ESOPHAGOGASTRODUODENOSCOPY (EGD);  Surgeon: Barrie Folk, MD;  Location: [facility] ENDOSCOPY;  Service: Endoscopy;  Laterality: N/A;  . IR ANGIO INTRA EXTRACRAN SEL INTERNAL CAROTID BILAT MOD SED  04/07/2017  . IR ANGIO VERTEBRAL SEL VERTEBRAL UNI R MOD SED  04/07/2017  . IR ANGIOGRAM FOLLOW UP STUDY  04/07/2017  . IR ANGIOGRAM FOLLOW UP STUDY  04/07/2017  . IR ANGIOGRAM FOLLOW UP STUDY  04/07/2017  . IR ANGIOGRAM FOLLOW UP STUDY  04/07/2017  . IR ANGIOGRAM FOLLOW UP STUDY  04/07/2017  . IR ANGIOGRAM SELECTIVE EACH ADDITIONAL VESSEL  04/07/2017  . IR TRANSCATH/EMBOLIZ  04/07/2017  . PARS PLANA VITRECTOMY Right 05/01/2017   Procedure: PARS PLANA VITRECTOMY WITH 25 GAUGE RIGHT EYE, endolaser photocoaglation;  Surgeon:  Carmela Rima, MD;  Location: [facility] OR;  Service: Ophthalmology;  Laterality: Right;  . RADIOLOGY WITH ANESTHESIA N/A 04/07/2017   Procedure: RADIOLOGY WITH ANESTHESIA;  Surgeon: Lisbeth Renshaw, MD;  Location: [facility] OR;  Service: Radiology;  Laterality: N/A;  . REPLACEMENT TOTAL KNEE BILATERAL  2004  . TEMPOROMANDIBULAR JOINT SURGERY     2 surgeries  . TUMOR REMOVAL    . VAGOTOMY  01/23/2012   Procedure: VAGOTOMY, antrectomy and BII;  Surgeon: Currie Paris, MD;  Location: [facility] OR;  Service: General;  Laterality: N/A;  Laparotomy with vagotomy.    Vitals:   09/30/17 1408  BP: 120/80        Subjective Assessment - 09/30/17 1414    Subjective Pt reports yesterday she was so fatigued she didn't get off of the couch with difficulty with vision and dizziness.  Didn't check BP at home.  Feeling better today.   Patient is accompained by: Family member   Limitations Standing;Walking   Currently in Pain? Yes                         [facility] Adult PT Treatment/Exercise - 09/30/17 1417      Ambulation/Gait   Ambulation/Gait Yes   Ambulation/Gait Assistance 4: Min assist   Ambulation/Gait Assistance Details gait with cane with quad tip over level surfaces with therapist providing tactile cues at pelvis and upper trunk for balance, weight shifting and verbal  cues for stepping sequence.  With LOB laterallly or posteriorly pt able to self correct with extra time 80% of the time.  Improved sequencing today; by end of session pt had begun to initiate step through gait sequence.     Ambulation Distance (Feet) 115 Feet   Assistive device Straight cane   Gait Pattern Step-to pattern;Step-through pattern;Decreased step length - right;Decreased step length - left;Decreased stride length;Decreased weight shift to left;Ataxic;Wide base of support   Ambulation Surface Level;Indoor   Stairs Yes   Stairs Assistance 4: Min assist   Stairs Assistance Details (indicate cue type and reason) to  focus on sequencing with cane, weight shifting and balance   Stair Management Technique One rail Left;With cane;Step to pattern;Forwards   Number of Stairs 8   Height of Stairs 6   Gait Comments Also performed lateral stepping to L and R and retro stepping with cane with focus on weight shifting, sequencing/motor planning and grading of movement/coordination.  Required tactile and verbal cues to for controlled weight shift to R and to increase weight shift to L                PT Education - 09/30/17 1602    Education provided Yes   Education Details gait with cane   Person(s) Educated Patient;Spouse   Methods Explanation;Demonstration   Comprehension Need further instruction          PT Short Term Goals - 09/13/17 1645      PT SHORT TERM GOAL #1   Title Pt will participate in further assessment of falls risk during gait with DGI   Time 4   Period Weeks   Status New   Target Date 10/13/17     PT SHORT TERM GOAL #2   Title Pt will decrease falls risk with gait as indicated by increase in gait velocity with LRAD to > or = 2.0 ft/sec   Baseline 1.78 ft/sec with RW   Time 4   Period Weeks   Status Revised   Target Date 10/13/17     PT SHORT TERM GOAL #3   Title Pt will decrease falls risk as indicated by increase in BERG balance score to > or = 45/56   Baseline 39/56   Time 4   Period Weeks   Status Revised   Target Date 10/13/17     PT SHORT TERM GOAL #4   Title Pt will ambulate >200' on indoor surfaces with cane (SPC with or without quad tip) and negotiate 4 stairs with one rail and cane, alternating sequence with min A    Time 4   Period Weeks   Status New   Target Date 10/13/17           PT Long Term Goals - 09/13/17 1650      PT LONG TERM GOAL #1   Title Pt and husband will demonstrate independence with HEP   Time 8   Period Weeks   Status On-going   Target Date 11/12/17     PT LONG TERM GOAL #2   Title Pt will demonstrate improved balance and  decreased falls risk as indicated by BERG balance score of > or = 50/56   Time 8   Period Weeks   Status Achieved   Target Date 11/12/17     PT LONG TERM GOAL #3   Title Pt will ambulate with cane x 300' over paved outdoor surfaces (curbs and inclines) with supervision; will negotiate 4 stairs with one  rail and cane with alternating sequence with supervision   Time 8   Period Weeks   Status Revised   Target Date 11/12/17     PT LONG TERM GOAL #4   Title Pt will report 15% improvement in Neuro QOL-LE   Baseline 31.3%   Time 8   Period Weeks   Status On-going   Target Date 11/12/17     PT LONG TERM GOAL #5   Title Pt will decrease falls risk during gait in home/community as indicated by increase in gait velocity to > or = 2.5 ft/sec with LRAD   Time 8   Period Weeks   Status Revised   Target Date 11/12/17     PT LONG TERM GOAL #6   Title Pt will demonstrate decreased falls risk with gait as indicated by increase in DGI score by 4 points   Baseline TBD   Time 8   Period Weeks   Status New   Target Date 11/12/17               Plan - 09/30/17 1605    Clinical Impression Statement Continued to focus on gait training with cane on level surfaces in various directions and performing stair negotiation/sequencing with cane and rail.  Pt demonstrating improved sequencing and coordination with cane overall today and was able to ambulate >100' with minimal assistance.  Will continue to address and progress towards LTG.   Rehab Potential Good   PT Frequency 2x / week   PT Duration 8 weeks   PT Treatment/Interventions ADLs/Self Care Home Management;Aquatic Therapy;DME Instruction;Gait training;Stair training;Functional mobility training;Therapeutic activities;Therapeutic exercise;Balance training;Neuromuscular re-education;Patient/family education;Vestibular;Visual/perceptual remediation/compensation;Cognitive remediation;Orthotic Fit/Training   PT Next Visit Plan ASSESS VITALS AT  [facility] VISIT (Manual cuff works better). continue to work on gait with cane-various directions and surfaces, standing balance with emphasis on weight shifting, SLS and modified tandem standing. assess DGI when appropriate.    Consulted and Agree with Plan of Care Patient;Family member/caregiver   Family Member Consulted husband      Patient will benefit from skilled therapeutic intervention in order to improve the following deficits and impairments:  Abnormal gait, Decreased balance, Decreased cognition, Decreased coordination, Decreased strength, Difficulty walking, Dizziness, Impaired sensation, Impaired vision/preception, Pain, Decreased activity tolerance, Decreased mobility  Visit Diagnosis: Ataxic gait  Other lack of coordination  Other abnormalities of gait and mobility  Unsteadiness on feet  Dizziness and giddiness     Problem List Patient Active Problem List   Diagnosis Date Noted  . Weakness with dizziness-  since [facility] and CVA 04/07/17 08/07/2017  . Disturbances of vision, late effect of stroke 08/06/2017  . Health education/counseling 08/05/2017  . High risk medications (not anticoagulants) long-term use 08/05/2017  . Neuritis-  R sided:  arm and leg/ body due to stroke 08/05/2017  . Elevated LDL cholesterol level 07/11/2017  . [employer] ([employer]) Stroke Scale limb ataxia score 2, ataxia present in two limbs 06/25/2017  . 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 Elevated vitamin B12 level 05/30/2017  . Vitamin D deficiency 05/29/2017  . History of tobacco abuse-  30pk yr hx - quit 04/07/17 05/21/2017  . Gait disturbance, post-stroke 05/14/2017  . Benign essential HTN   . Vitreous hemorrhage of right eye ([name])   . Adjustment disorder with mixed anxiety and depressed mood   . 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 s/p  SAH (subarachnoid hemorrhage) ([name]) 04/19/2017   . Basilar artery aneurysm ([name])   . Hypoxia   . 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 CVA (cerebral vascular accident) ([name]) 04/07/2017  . Elevated gastrin level 01/25/2012  . Hypokalemia 01/21/2012  . Nausea & vomiting 01/20/2012  . Epigastric pain 01/20/2012  . Duodenal ulcer, acute with obstruction 10/17/2011  . S/P laparoscopic cholecystectomy 10/16/2011    Dierdre HighmanAudra F Potter, PT, DPT 09/30/17    4:12 PM    [facility] [address] [place], [place], [address] Phone: [phone]   Fax:  [phone]  Name: [name] Barnett MRN: [id number] Date of Birth: 10/18/1976

## 2017-09-30 NOTE — Therapy (Signed)
[facility] [address] [place], [place], [address] Phone: [phone]   Fax:  [phone]  Occupational Therapy Treatment  Patient Details  Name: [name] Barnett MRN: [id number] Date of Birth: 03-04-1976 Referring Provider: Dr. Alysia Penna  Encounter Date: 09/30/2017      OT End of Session - 09/30/17 1451    Visit Number 16   Number of Visits 30   Date for OT Re-Evaluation 12/18/17   Authorization Type BCBS, no visit limit/auth req.   Authorization Time Period renewal completed 09/19/17 for 8 wks   OT Start Time 1450   OT Stop Time 1530   OT Time Calculation (min) 40 min   Activity Tolerance Patient tolerated treatment well   Behavior During Therapy WFL for tasks assessed/performed      Past Medical History:  Diagnosis Date  . Duodenal obstruction   . GERD (gastroesophageal reflux disease)   . Hypertension   . Stroke [facility])    april 2018    Past Surgical History:  Procedure Laterality Date  . ABDOMINAL HYSTERECTOMY    . BALLOON DILATION  12/31/2011   Procedure: BALLOON DILATION;  Surgeon: Missy Sabins, MD;  Location[facility] ENDOSCOPY;  Service: Endoscopy;  Laterality: N/A;  . CHOLECYSTECTOMY  07/28/11  . ESOPHAGOGASTRODUODENOSCOPY  10/17/2011   Procedure: ESOPHAGOGASTRODUODENOSCOPY (EGD);  Surgeon: Missy Sabins, MD;  Location: [facility] ENDOSCOPY;  Service: Endoscopy;  Laterality: N/A;  . IR ANGIO INTRA EXTRACRAN SEL INTERNAL CAROTID BILAT MOD SED  04/07/2017  . IR ANGIO VERTEBRAL SEL VERTEBRAL UNI R MOD SED  04/07/2017  . IR ANGIOGRAM FOLLOW UP STUDY  04/07/2017  . IR ANGIOGRAM FOLLOW UP STUDY  04/07/2017  . IR ANGIOGRAM FOLLOW UP STUDY  04/07/2017  . IR ANGIOGRAM FOLLOW UP STUDY  04/07/2017  . IR ANGIOGRAM FOLLOW UP STUDY  04/07/2017  . IR ANGIOGRAM SELECTIVE EACH ADDITIONAL VESSEL  04/07/2017  . IR TRANSCATH/EMBOLIZ  04/07/2017  . PARS PLANA VITRECTOMY Right 05/01/2017   Procedure: PARS PLANA VITRECTOMY WITH 25 GAUGE  RIGHT EYE, endolaser photocoaglation;  Surgeon: Jalene Mullet, MD;  Location: [place];  Service: Ophthalmology;  Laterality: Right;  . RADIOLOGY WITH ANESTHESIA N/A 04/07/2017   Procedure: RADIOLOGY WITH ANESTHESIA;  Surgeon: Consuella Lose, MD;  Location: [place];  Service: Radiology;  Laterality: N/A;  . REPLACEMENT TOTAL KNEE BILATERAL  2004  . TEMPOROMANDIBULAR JOINT SURGERY     2 surgeries  . TUMOR REMOVAL    . VAGOTOMY  01/23/2012   Procedure: VAGOTOMY, antrectomy and BII;  Surgeon: Haywood Lasso, MD;  Location: [place];  Service: General;  Laterality: N/A;  Laparotomy with vagotomy.    There were no vitals filed for this visit.      Subjective Assessment - 09/30/17 1449    Subjective  Hopefully will get new glasses this week.   Patient is accompained by: Family member   Pertinent History Subarachnoid hemorrhage due to ruptured aneurysm, ataxia (L cerebellar); HTN, R side decr sensation, vertical diplopia/visual deficits, ataxia, Bilateral retinal hemorhage associated with SAH (with surgery on R eye in hospital and L approx 1 month ago)   Limitations visual deficits, fall risk, impulsivity/cognitive deficits   Patient Stated Goals be able to walk and use L arm   Currently in Pain? No/denies   Pain Onset More than a month ago      In standing, functional reaching with LUE to place/remove clothespins with 1-8lb resist.  Pt with min-mod cueing to slow down for  incr control and for wt. Shift to the L for incr midline alignment and occasional L shoulder hike.  1 rest break after placing clothespins on prior to removing.  In sitting, placing/removing various sized pegs in arc pegboard with min-mod cueing for impulsivity and for midline alignment.    In sitting, flipping large cards with emphasis on slow/controlled movements and midline alignment.    Sit>stand with min A for balance x1 during session and CGA for other transfers.                      OT Short Term  Goals - 09/19/17 1454      OT SHORT TERM GOAL #1   Title Pt/caregiver will be independent with initial HEP.   Time 4   Period Weeks   Status Achieved  08/19/17:  with cueing from husband     OT [place] #2   Title Pt will be use LUE as nondominant assist at least 50% of the time for ADLs without cueing.   Time 4   Period Weeks   Status Achieved  08/19/17:  approx 40% of the time.  09/19/17:  90%     OT SHORT TERM GOAL #3   Title Pt will improve LUE coordination/control to improve score on box and blocks test by at least 6 with LUE.   Baseline 19 blocks   Time 4   Period Weeks   Status Achieved  08/22/17:  18, 20 blocks.  09/19/17:  25 blocks     OT SHORT TERM GOAL #4   Title Pt will perform simple home maintenance task/snack prep in sitting with set-up.   Time 4   Period Weeks   Status Achieved  08/19/17:  met in clinic with set-up and cueing/prompts for encouragement/to slow down     OT SHORT TERM GOAL #5   Title Pt/husband will verbalize understanding of visual compensation stategies for improved safety/incr ease with ADLs.   Time 4   Period Weeks   Status Achieved     Additional Short Term Goals   Additional Short Term Goals Yes     OT SHORT TERM GOAL #6   Title Pt will perform simple environmental scanning/navigation with CGA.--check STGs 10/18/17   Time 4   Period Weeks   Status New     OT SHORT TERM GOAL #7   Title Pt will complete functional task with LUE with no more than min cueing for impulsivity.     Time 4   Period Weeks   Status New   Target Date 10/18/17           OT Long Term Goals - 09/19/17 1457      OT LONG TERM GOAL #1   Title Pt/caregiver will be independent with updated HEP.--check LTGs 12/18/16   Time 8   Period Weeks   Status On-going     OT LONG TERM GOAL #2   Title Pt will be use LUE as nondominant assist at least 75% of the time for ADLs without cueing.   Time 8   Period Weeks   Status Achieved  uses 75% x however needs  prompting at times.  09/19/17:  met per pt  90%     OT LONG TERM GOAL #3   Title Pt will perform toileting mod I (including transfer).   Time 8   Period Weeks   Status On-going  supervision.  09/19/17:  continues to need superivsion for mobility  OT LONG TERM GOAL #4   Title Pt will improve LUE coordination/control to improve score on box and blocks test by at least 12 with LUE.   Baseline 19 blocks   Time 8   Period Weeks   Status On-going  18, 19 blocks.  09/19/17:  25 blocks     OT LONG TERM GOAL #5   Title Pt will perform simple home maintenance task/snack prep in standing with set-up/supervision.   Time 8   Period Weeks   Status On-going  Pt is not perfoming consistely, she has made a sandwich in seated     OT LONG TERM GOAL #6   Title Pt will perform simple environmental scanning/navigation with supervision.   Time 8   Period Weeks   Status On-going               Plan - 09/30/17 1452    Clinical Impression Statement Pt is progressing towards goals with incr control of LUE.  Pt continues to need cueing for impulsivity, but less than in the past.  Pt also needs cueing for midline alignment in standing/sitting tasks.   Rehab Potential Good   Current Impairments/barriers affecting progress: cognitive deficits, impulsivity   OT Frequency 2x / week   OT Duration 8 weeks   OT Treatment/Interventions Self-care/ADL training;DME and/or AE instruction;Patient/family education;Therapeutic exercises;Balance training;Fluidtherapy;Moist Heat;Ultrasound;Therapeutic exercise;Therapeutic activities;Cryotherapy;Neuromuscular education;Functional Mobility Training;Passive range of motion;Cognitive remediation/compensation;Visual/perceptual remediation/compensation;Manual Therapy   Plan simple environmental scanning/navigation, LUE functional use   OT Home Exercise Plan Education provided:  HEP for LUE control, coordination, and functional use   Consulted and Agree with Plan of  Care Patient;Family member/caregiver      Patient will benefit from skilled therapeutic intervention in order to improve the following deficits and impairments:  Decreased coordination, Decreased range of motion, Difficulty walking, Abnormal gait, Decreased safety awareness, Impaired sensation, Decreased knowledge of precautions, Decreased balance, Decreased knowledge of use of DME, Impaired UE functional use, Decreased cognition, Decreased mobility, Decreased strength, Impaired vision/preception, Impaired perceived functional ability  Visit Diagnosis: 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 Weakness with dizziness-  since [facility] and CVA 04/07/17 08/07/2017  . Disturbances of vision, late effect of stroke 08/06/2017  . Health education/counseling 08/05/2017  . High risk medications (not anticoagulants) long-term use 08/05/2017  . Neuritis-  R sided:  arm and leg/ body due to stroke 08/05/2017  . Elevated LDL cholesterol level 07/11/2017  . [employer] (NIH) Stroke Scale limb ataxia score 2, ataxia present in two limbs 06/25/2017  . 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 Elevated vitamin B12 level 05/30/2017  . Vitamin D deficiency 05/29/2017  . History of tobacco abuse-  30pk yr hx - quit 04/07/17 05/21/2017  . Gait disturbance, post-stroke 05/14/2017  . Benign essential HTN   . Vitreous hemorrhage of right eye ([place])   . Adjustment disorder with mixed anxiety and  depressed mood   . 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 s/p SAH (subarachnoid hemorrhage) ([place]) 04/19/2017  .  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 Hypoxia   . Subarachnoid hemorrhage due to ruptured aneurysm ([place]) 04/07/2017  . CVA (cerebral vascular accident) ([place]) 04/07/2017  . Elevated gastrin level 01/25/2012  . Hypokalemia 01/21/2012  . Nausea & vomiting 01/20/2012  . Epigastric pain 01/20/2012  . Duodenal ulcer, acute with obstruction 10/17/2011  . S/P laparoscopic cholecystectomy 10/16/2011    [facility] 09/30/2017, 3:41 PM  [facility] [address] [place], [place], [address] Phone: [phone]   Fax:  [phone]  Name: [name] Barnett MRN: [id number] Date of Birth: 1976/04/12   Vianne Bulls, OTR/L [facility] [address] [place], [place]  [address] [phone] phone [phone] 09/30/17 3:42 PM

## 2017-10-03 ENCOUNTER — Encounter: Payer: Self-pay | Admitting: Physical Therapy

## 2017-10-03 ENCOUNTER — Ambulatory Visit: Payer: BLUE CROSS/BLUE SHIELD | Admitting: Physical Therapy

## 2017-10-03 ENCOUNTER — Encounter: Payer: Self-pay | Admitting: Occupational Therapy

## 2017-10-03 ENCOUNTER — Ambulatory Visit: Payer: BLUE CROSS/BLUE SHIELD | Admitting: Occupational Therapy

## 2017-10-03 VITALS — BP 100/70

## 2017-10-03 DIAGNOSIS — R2689 Other abnormalities of gait and mobility: Secondary | ICD-10-CM

## 2017-10-03 DIAGNOSIS — R278 Other lack of coordination: Secondary | ICD-10-CM

## 2017-10-03 DIAGNOSIS — R208 Other disturbances of skin sensation: Secondary | ICD-10-CM

## 2017-10-03 DIAGNOSIS — R414 Neurologic neglect syndrome: Secondary | ICD-10-CM

## 2017-10-03 DIAGNOSIS — I69018 Other symptoms and signs involving cognitive functions following nontraumatic subarachnoid hemorrhage: Secondary | ICD-10-CM

## 2017-10-03 DIAGNOSIS — R41840 Attention and concentration deficit: Secondary | ICD-10-CM

## 2017-10-03 DIAGNOSIS — R270 Ataxia, unspecified: Secondary | ICD-10-CM

## 2017-10-03 DIAGNOSIS — R260 Ataxic gait: Secondary | ICD-10-CM

## 2017-10-03 DIAGNOSIS — R41842 Visuospatial deficit: Secondary | ICD-10-CM

## 2017-10-03 DIAGNOSIS — R2681 Unsteadiness on feet: Secondary | ICD-10-CM

## 2017-10-03 DIAGNOSIS — R42 Dizziness and giddiness: Secondary | ICD-10-CM

## 2017-10-03 DIAGNOSIS — I69054 Hemiplegia and hemiparesis following nontraumatic subarachnoid hemorrhage affecting left non-dominant side: Secondary | ICD-10-CM

## 2017-10-03 NOTE — Therapy (Signed)
[facility] [address] [place], [place], [address] Phone: [phone]   Fax:  [phone]  Occupational Therapy Treatment  Patient Details  Name: [name] Barnett MRN: [id number] Date of Birth: 11/20/1976 Referring Provider: Dr. Alysia Penna  Encounter Date: 10/03/2017      OT End of Session - 10/03/17 1637    Visit Number 17   Number of Visits 30   Date for OT Re-Evaluation 12/18/17   Authorization Type BCBS, no visit limit/auth req.   Authorization Time Period renewal completed 09/19/17 for 8 wks   OT Start Time 1532   OT Stop Time 1614   OT Time Calculation (min) 42 min   Activity Tolerance Patient tolerated treatment well      Past Medical History:  Diagnosis Date  . Duodenal obstruction   . GERD (gastroesophageal reflux disease)   . Hypertension   . Stroke [facility])    april 2018    Past Surgical History:  Procedure Laterality Date  . ABDOMINAL HYSTERECTOMY    . BALLOON DILATION  12/31/2011   Procedure: BALLOON DILATION;  Surgeon: Missy Sabins, MD;  Location: [facility] ENDOSCOPY;  Service: Endoscopy;  Laterality: N/A;  . CHOLECYSTECTOMY  07/28/11  . ESOPHAGOGASTRODUODENOSCOPY  10/17/2011   Procedure: ESOPHAGOGASTRODUODENOSCOPY (EGD);  Surgeon: Missy Sabins, MD;  Location: [facility] ENDOSCOPY;  Service: Endoscopy;  Laterality: N/A;  . IR ANGIO INTRA EXTRACRAN SEL INTERNAL CAROTID BILAT MOD SED  04/07/2017  . IR ANGIO VERTEBRAL SEL VERTEBRAL UNI R MOD SED  04/07/2017  . IR ANGIOGRAM FOLLOW UP STUDY  04/07/2017  . IR ANGIOGRAM FOLLOW UP STUDY  04/07/2017  . IR ANGIOGRAM FOLLOW UP STUDY  04/07/2017  . IR ANGIOGRAM FOLLOW UP STUDY  04/07/2017  . IR ANGIOGRAM FOLLOW UP STUDY  04/07/2017  . IR ANGIOGRAM SELECTIVE EACH ADDITIONAL VESSEL  04/07/2017  . IR TRANSCATH/EMBOLIZ  04/07/2017  . PARS PLANA VITRECTOMY Right 05/01/2017   Procedure: PARS PLANA VITRECTOMY WITH 25 GAUGE RIGHT EYE, endolaser photocoaglation;  Surgeon: Jalene Mullet, MD;  Location: [place];  Service: Ophthalmology;  Laterality: Right;  . RADIOLOGY WITH ANESTHESIA N/A 04/07/2017   Procedure: RADIOLOGY WITH ANESTHESIA;  Surgeon: Consuella Lose, MD;  Location: [facility];  Service: Radiology;  Laterality: N/A;  . REPLACEMENT TOTAL KNEE BILATERAL  2004  . TEMPOROMANDIBULAR JOINT SURGERY     2 surgeries  . TUMOR REMOVAL    . VAGOTOMY  01/23/2012   Procedure: VAGOTOMY, antrectomy and BII;  Surgeon: Haywood Lasso, MD;  Location: [place];  Service: General;  Laterality: N/A;  Laparotomy with vagotomy.    There were no vitals filed for this visit.      Subjective Assessment - 10/03/17 1535    Subjective  My husband called and my glasses are supposed to be in on 10/08/2017.     Pertinent History Subarachnoid hemorrhage due to ruptured aneurysm, ataxia (L cerebellar); HTN, R side decr sensation, vertical diplopia/visual deficits, ataxia, Bilateral retinal hemorhage associated with SAH (with surgery on R eye in hospital and L approx 1 month ago)   Limitations visual deficits, fall risk, impulsivity/cognitive deficits   Patient Stated Goals be able to walk and use L arm   Currently in Pain? No/denies                      OT Treatments/Exercises (OP) - 10/03/17 0001      Visual/Perceptual Exercises   Other Exercises Addressed scanning letters using reading guide -  pt sees significantly better using reading guides with yellow color.  Pt able to read letters at 72M level with scanning guide and cues to slow down.  Pt and husband given info on how to order in writing.  Assessed oculomotor and smooth pursuits, relative to reading - pt has less visual disturbance when she reads with one eye at time.  With both eyes lettes become blurry as she scans, possibly indicating gaze stabilization issues.      Neurological Re-education Exercises   Other Exercises 1 Neuro re ed to address functional use of LUE with tasks using vision to compensated for  control as wel as proximal stability with elbow sliding on table for improved control. Pt needs mod cues for impulsivity and to look at hand/reduced speed of movement - performance then improves signficantly.  Pt becomes easily frustrated therefore altenated between these tasks and visual scanning tasks.              Balance Exercises - 10/03/17 1516      Balance Exercises: Standing   Other Standing Exercises Performed lateral taps to 6" step x 5 reps each LE with one UE support on cane and then maintaining foot on step and performing lateral weight shift onto elevated foot     OTAGO PROGRAM   Sideways Walking No assistive device  cane and therapist at trunk for weight shifting           OT Education - 10/03/17 1635    Education provided Yes   Education Details reading guides and how to order   Person(s) Educated Patient;Spouse   Methods Explanation;Demonstration;Handout   Comprehension Verbalized understanding;Returned demonstration          OT Short Term Goals - 10/03/17 1636      OT SHORT TERM GOAL #1   Title Pt/caregiver will be independent with initial HEP.   Time 4   Period Weeks   Status Achieved  08/19/17:  with cueing from husband     OT [place] #2   Title Pt will be use LUE as nondominant assist at least 50% of the time for ADLs without cueing.   Time 4   Period Weeks   Status Achieved  08/19/17:  approx 40% of the time.  09/19/17:  90%     OT SHORT TERM GOAL #3   Title Pt will improve LUE coordination/control to improve score on box and blocks test by at least 6 with LUE.   Baseline 19 blocks   Time 4   Period Weeks   Status Achieved  08/22/17:  18, 20 blocks.  09/19/17:  25 blocks     OT SHORT TERM GOAL #4   Title Pt will perform simple home maintenance task/snack prep in sitting with set-up.   Time 4   Period Weeks   Status Achieved  08/19/17:  met in clinic with set-up and cueing/prompts for encouragement/to slow down     OT SHORT  TERM GOAL #5   Title Pt/husband will verbalize understanding of visual compensation stategies for improved safety/incr ease with ADLs.   Time 4   Period Weeks   Status Achieved     OT SHORT TERM GOAL #6   Title Pt will perform simple environmental scanning/navigation with CGA.--check STGs 10/18/17   Time 4   Period Weeks   Status New     OT SHORT TERM GOAL #7   Title Pt will complete functional task with LUE with no more than min cueing  for impulsivity.     Time 4   Period Weeks   Status New           OT Long Term Goals - 10/03/17 1636      OT LONG TERM GOAL #1   Title Pt/caregiver will be independent with updated HEP.--check LTGs 12/18/16   Time 8   Period Weeks   Status On-going     OT LONG TERM GOAL #2   Title Pt will be use LUE as nondominant assist at least 75% of the time for ADLs without cueing.   Time 8   Period Weeks   Status Achieved  uses 75% x however needs prompting at times.  09/19/17:  met per pt  90%     OT LONG TERM GOAL #3   Title Pt will perform toileting mod I (including transfer).   Time 8   Period Weeks   Status On-going  supervision.  09/19/17:  continues to need superivsion for mobility     OT LONG TERM GOAL #4   Title Pt will improve LUE coordination/control to improve score on box and blocks test by at least 12 with LUE.   Baseline 19 blocks   Time 8   Period Weeks   Status On-going  18, 19 blocks.  09/19/17:  25 blocks     OT LONG TERM GOAL #5   Title Pt will perform simple home maintenance task/snack prep in standing with set-up/supervision.   Time 8   Period Weeks   Status On-going  Pt is not perfoming consistely, she has made a sandwich in seated     OT LONG TERM GOAL #6   Title Pt will perform simple environmental scanning/navigation with supervision.   Time 8   Period Weeks   Status On-going               Plan - 10/03/17 1636    Clinical Impression Statement Pt progressing toward goals.  Pt with increased control  of LUE with proximal stabilization and cues to use vision and slow speed down.    Rehab Potential Good   Current Impairments/barriers affecting progress: cognitive deficits, impulsivity   OT Frequency 2x / week   OT Duration 8 weeks   OT Treatment/Interventions Self-care/ADL training;DME and/or AE instruction;Patient/family education;Therapeutic exercises;Balance training;Fluidtherapy;Moist Heat;Ultrasound;Therapeutic exercise;Therapeutic activities;Cryotherapy;Neuromuscular education;Functional Mobility Training;Passive range of motion;Cognitive remediation/compensation;Visual/perceptual remediation/compensation;Manual Therapy   Plan simple environmental scanning/navigation, LUE functional use   Consulted and Agree with Plan of Care Patient;Family member/caregiver   Family Member Consulted husband      Patient will benefit from skilled therapeutic intervention in order to improve the following deficits and impairments:  Decreased coordination, Decreased range of motion, Difficulty walking, Abnormal gait, Decreased safety awareness, Impaired sensation, Decreased knowledge of precautions, Decreased balance, Decreased knowledge of use of DME, Impaired UE functional use, Decreased cognition, Decreased mobility, Decreased strength, Impaired vision/preception, Impaired perceived functional ability  Visit Diagnosis: Other lack of coordination  Unsteadiness on feet  Other symptoms and signs involving cognitive functions following nontraumatic subarachnoid hemorrhage  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 Weakness with dizziness-  since [facility] and CVA 04/07/17 08/07/2017  . Disturbances of vision, late effect of  stroke 08/06/2017  . Health education/counseling 08/05/2017  . High risk  medications (not anticoagulants) long-term use 08/05/2017  . Neuritis-  R sided:  arm and leg/ body due to stroke 08/05/2017  . Elevated LDL cholesterol level 07/11/2017  . [employer] (NIH) Stroke Scale limb ataxia score 2, ataxia present in two limbs 06/25/2017  . 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 Elevated vitamin B12 level 05/30/2017  . Vitamin D deficiency 05/29/2017  . History of tobacco abuse-  30pk yr hx - quit 04/07/17 05/21/2017  . Gait disturbance, post-stroke 05/14/2017  . Benign essential HTN   . Vitreous hemorrhage of right eye ([place])   . Adjustment disorder with mixed anxiety and depressed mood   . 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 s/p SAH (subarachnoid hemorrhage) ([place]) 04/19/2017  . 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 Hypoxia   . Subarachnoid hemorrhage due to ruptured aneurysm ([place]) 04/07/2017  . CVA (cerebral vascular accident) ([place]) 04/07/2017  . Elevated gastrin level 01/25/2012  . Hypokalemia 01/21/2012  . Nausea & vomiting 01/20/2012  . Epigastric pain 01/20/2012  . Duodenal ulcer, acute with obstruction 10/17/2011  . S/P laparoscopic cholecystectomy 10/16/2011    Quay Burow, OTR/L 10/03/2017, 4:39 PM  [facility] [address] [place], [place], [address] Phone: [phone]   Fax:  [phone]  Name: [name] Barnett MRN: [id number] Date of Birth: [birth year]

## 2017-10-03 NOTE — Therapy (Signed)
[facility] [address] [place], [place], [address] Phone: [phone]   Fax:  [phone]  Physical Therapy Treatment  Patient Details  Name: [name] Barnett MRN: [id number] Date of Birth: 02/11/1976 Referring Provider: Erick ColaceAndrew E Kirsteins, MD  Encounter Date: 10/03/2017      PT End of Session - 10/03/17 1653    Visit Number 19   Number of Visits 30  per recertification   Date for PT Re-Evaluation 11/12/17  per recertification   Authorization Type BCBS   PT Start Time 1448   PT Stop Time 1530   PT Time Calculation (min) 42 min   Activity Tolerance Patient tolerated treatment well   Behavior During Therapy [facility] for tasks assessed/performed      Past Medical History:  Diagnosis Date  . Duodenal obstruction   . GERD (gastroesophageal reflux disease)   . Hypertension   . Stroke [facility]([name])    april 2018    Past Surgical History:  Procedure Laterality Date  . ABDOMINAL HYSTERECTOMY    . BALLOON DILATION  12/31/2011   Procedure: BALLOON DILATION;  Surgeon: Barrie FolkJohn C Hayes, MD;  [facility];  Service: Endoscopy;  Laterality: N/A;  . CHOLECYSTECTOMY  07/28/11  . ESOPHAGOGASTRODUODENOSCOPY  10/17/2011   Procedure: ESOPHAGOGASTRODUODENOSCOPY (EGD);  Surgeon: Barrie FolkJohn C Hayes, MD;  [facility];  Service: Endoscopy;  Laterality: N/A;  . IR ANGIO INTRA EXTRACRAN SEL INTERNAL CAROTID BILAT MOD SED  04/07/2017  . IR ANGIO VERTEBRAL SEL VERTEBRAL UNI R MOD SED  04/07/2017  . IR ANGIOGRAM FOLLOW UP STUDY  04/07/2017  . IR ANGIOGRAM FOLLOW UP STUDY  04/07/2017  . IR ANGIOGRAM FOLLOW UP STUDY  04/07/2017  . IR ANGIOGRAM FOLLOW UP STUDY  04/07/2017  . IR ANGIOGRAM FOLLOW UP STUDY  04/07/2017  . IR ANGIOGRAM SELECTIVE EACH ADDITIONAL VESSEL  04/07/2017  . IR TRANSCATH/EMBOLIZ  04/07/2017  . PARS PLANA VITRECTOMY Right 05/01/2017   Procedure: PARS PLANA VITRECTOMY WITH 25 GAUGE RIGHT EYE, endolaser photocoaglation;  Surgeon:  Carmela RimaPatel, Narendra, MD;  [facility];  Service: Ophthalmology;  Laterality: Right;  . RADIOLOGY WITH ANESTHESIA N/A 04/07/2017   Procedure: RADIOLOGY WITH ANESTHESIA;  Surgeon: [name], MD;  [facility];  Service: Radiology;  Laterality: N/A;  . REPLACEMENT TOTAL KNEE BILATERAL  2004  . TEMPOROMANDIBULAR JOINT SURGERY     2 surgeries  . TUMOR REMOVAL    . VAGOTOMY  01/23/2012   Procedure: VAGOTOMY, antrectomy and BII;  Surgeon: [name], MD;  Location: [facility] OR;  Service: General;  Laterality: N/A;  Laparotomy with vagotomy.    Vitals:   10/03/17 1457  BP: 100/70        Subjective Assessment - 10/03/17 1453    Subjective Pt continues to not feel well and eyes continue to feel "uneven"; feels better if she closes one eye or wears sunglasses.  Has had some ligthheadedness today.   Patient is accompained by: Family member   Limitations Standing;Walking   Currently in Pain? No/denies                         [facility] Adult PT Treatment/Exercise - 10/03/17 1515      Ambulation/Gait   Stairs Yes   Stairs Assistance 4: Min assist   Stairs Assistance Details (indicate cue type and reason) pt continues to require max verbal cues for correct sequencing, attention to LUE placement and pt continues to catch L heel on  step when descending   Stair Management Technique One rail Left;With cane;Step to pattern;Forwards   Number of Stairs 8   Height of Stairs 6             Balance Exercises - 10/03/17 1516      Balance Exercises: Standing   Other Standing Exercises Performed lateral taps to 6" step x 5 reps each LE with one UE support on cane and then maintaining foot on step and performing lateral weight shift onto elevated foot     OTAGO PROGRAM   Sideways Walking No assistive device  cane and therapist at trunk for weight shifting    Lateral stepping to L and R with cane with min A and facilitation of therapist for placement of cane and weight  shifting       PT Education - 10/03/17 1652    Education provided Yes   Education Details gait with cane, how to purchase cane and quad tip   Person(s) Educated Patient   Methods Explanation   Comprehension Verbalized understanding;Need further instruction          PT Short Term Goals - 09/13/17 1645      PT SHORT TERM GOAL #1   Title Pt will participate in further assessment of falls risk during gait with DGI   Time 4   Period Weeks   Status New   Target Date 10/13/17     PT SHORT TERM GOAL #2   Title Pt will decrease falls risk with gait as indicated by increase in gait velocity with LRAD to > or = 2.0 ft/sec   Baseline 1.78 ft/sec with RW   Time 4   Period Weeks   Status Revised   Target Date 10/13/17     PT SHORT TERM GOAL #3   Title Pt will decrease falls risk as indicated by increase in BERG balance score to > or = 45/56   Baseline 39/56   Time 4   Period Weeks   Status Revised   Target Date 10/13/17     PT SHORT TERM GOAL #4   Title Pt will ambulate >200' on indoor surfaces with cane (SPC with or without quad tip) and negotiate 4 stairs with one rail and cane, alternating sequence with min A    Time 4   Period Weeks   Status New   Target Date 10/13/17           PT Long Term Goals - 09/13/17 1650      PT LONG TERM GOAL #1   Title Pt and husband will demonstrate independence with HEP   Time 8   Period Weeks   Status On-going   Target Date 11/12/17     PT LONG TERM GOAL #2   Title Pt will demonstrate improved balance and decreased falls risk as indicated by BERG balance score of > or = 50/56   Time 8   Period Weeks   Status Achieved   Target Date 11/12/17     PT LONG TERM GOAL #3   Title Pt will ambulate with cane x 300' over paved outdoor surfaces (curbs and inclines) with supervision; will negotiate 4 stairs with one rail and cane with alternating sequence with supervision   Time 8   Period Weeks   Status Revised   Target Date 11/12/17      PT LONG TERM GOAL #4   Title Pt will report 15% improvement in Neuro QOL-LE   Baseline 31.3%   Time  8   Period Weeks   Status On-going   Target Date 11/12/17     PT LONG TERM GOAL #5   Title Pt will decrease falls risk during gait in home/community as indicated by increase in gait velocity to > or = 2.5 ft/sec with LRAD   Time 8   Period Weeks   Status Revised   Target Date 11/12/17     PT LONG TERM GOAL #6   Title Pt will demonstrate decreased falls risk with gait as indicated by increase in DGI score by 4 points   Baseline TBD   Time 8   Period Weeks   Status New   Target Date 11/12/17               Plan - 10/03/17 1653    Clinical Impression Statement Session initiated with assessment of pt BP due to increasing visual symptoms and not feeling well.  BP noted to be significantly lower than pt's norm with pt reporting some lightheadedness.  Discussed importance of regular hydration with pt.  Pt offered water but pt refused during PT session; OT alerted.  Continued to focus on gait training with increased focus on lateral weight shifting ,SLS and stance phase control with lateral taps, stair negotiation and lateral stepping.  Pt continues to improve and require decreased assistance.  Provided pt with purchasing information to allow pt to begin to practice with her own cane for home use.  Will continue to progress.   Rehab Potential Good   PT Frequency 2x / week   PT Duration 8 weeks   PT Treatment/Interventions ADLs/Self Care Home Management;Aquatic Therapy;DME Instruction;Gait training;Stair training;Functional mobility training;Therapeutic activities;Therapeutic exercise;Balance training;Neuromuscular re-education;Patient/family education;Vestibular;Visual/perceptual remediation/compensation;Cognitive remediation;Orthotic Fit/Training   PT Next Visit Plan ASSESS VITALS AT [facility] VISIT (Manual cuff works better). Have they purchased a cane yet? continue to work on gait with  cane-various directions and surfaces, standing balance with emphasis on weight shifting, SLS and modified tandem standing. assess DGI when appropriate.    Consulted and Agree with Plan of Care Patient;Family member/caregiver   Family Member Consulted husband      Patient will benefit from skilled therapeutic intervention in order to improve the following deficits and impairments:  Abnormal gait, Decreased balance, Decreased cognition, Decreased coordination, Decreased strength, Difficulty walking, Dizziness, Impaired sensation, Impaired vision/preception, Pain, Decreased activity tolerance, Decreased mobility  Visit Diagnosis: Ataxic gait  Other abnormalities of gait and mobility  Other lack of coordination  Unsteadiness on feet  Dizziness and giddiness     Problem List Patient Active Problem List   Diagnosis Date Noted  . Weakness with dizziness-  since [facility] and CVA 04/07/17 08/07/2017  . Disturbances of vision, late effect of stroke 08/06/2017  . Health education/counseling 08/05/2017  . High risk medications (not anticoagulants) long-term use 08/05/2017  . Neuritis-  R sided:  arm and leg/ body due to stroke 08/05/2017  . Elevated LDL cholesterol level 07/11/2017  . [employer] (NIH) Stroke Scale limb ataxia score 2, ataxia present in two limbs 06/25/2017  . 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 Elevated vitamin B12 level 05/30/2017  . Vitamin D deficiency 05/29/2017  . History of tobacco abuse-  30pk yr hx - quit 04/07/17 05/21/2017  . Gait disturbance, post-stroke 05/14/2017  . Benign essential HTN   . Vitreous hemorrhage of right eye ([name])   . Adjustment disorder with mixed anxiety and depressed mood   . 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 s/p SAH (subarachnoid hemorrhage) ([name]) 04/19/2017  . Basilar artery aneurysm ([name])   . Hypoxia   . Subarachnoid  hemorrhage due to ruptured aneurysm ([name]) 04/07/2017  . CVA (cerebral vascular accident) ([name]) 04/07/2017  . Elevated gastrin level 01/25/2012  . Hypokalemia 01/21/2012  . Nausea & vomiting 01/20/2012  . Epigastric pain 01/20/2012  . Duodenal ulcer, acute with obstruction 10/17/2011  . S/P laparoscopic cholecystectomy 10/16/2011    Dierdre Highman, PT, DPT 10/03/17    5:00 PM    [facility] [address] [place], [place], [address] Phone: [phone]   Fax:  [phone]  Name: [name] Barnett MRN: [id number] Date of Birth: January 09, 1976

## 2017-10-07 ENCOUNTER — Ambulatory Visit: Payer: BLUE CROSS/BLUE SHIELD | Admitting: Occupational Therapy

## 2017-10-07 ENCOUNTER — Ambulatory Visit: Payer: BLUE CROSS/BLUE SHIELD | Admitting: Physical Therapy

## 2017-10-07 VITALS — BP 130/88

## 2017-10-07 DIAGNOSIS — R2681 Unsteadiness on feet: Secondary | ICD-10-CM

## 2017-10-07 DIAGNOSIS — R278 Other lack of coordination: Secondary | ICD-10-CM | POA: Diagnosis not present

## 2017-10-07 DIAGNOSIS — R270 Ataxia, unspecified: Secondary | ICD-10-CM

## 2017-10-07 DIAGNOSIS — R414 Neurologic neglect syndrome: Secondary | ICD-10-CM

## 2017-10-07 DIAGNOSIS — I69054 Hemiplegia and hemiparesis following nontraumatic subarachnoid hemorrhage affecting left non-dominant side: Secondary | ICD-10-CM

## 2017-10-07 DIAGNOSIS — R208 Other disturbances of skin sensation: Secondary | ICD-10-CM

## 2017-10-07 DIAGNOSIS — R41840 Attention and concentration deficit: Secondary | ICD-10-CM

## 2017-10-07 DIAGNOSIS — I69018 Other symptoms and signs involving cognitive functions following nontraumatic subarachnoid hemorrhage: Secondary | ICD-10-CM

## 2017-10-07 DIAGNOSIS — R42 Dizziness and giddiness: Secondary | ICD-10-CM

## 2017-10-07 DIAGNOSIS — R41842 Visuospatial deficit: Secondary | ICD-10-CM

## 2017-10-07 DIAGNOSIS — R2689 Other abnormalities of gait and mobility: Secondary | ICD-10-CM

## 2017-10-07 NOTE — Therapy (Signed)
[place] [address] [place], [place], [address] Phone: [phone]   Fax:  [phone]  Physical Therapy Treatment  Patient Details  Name: [name] Barnett MRN: [id number] Date of Birth: 1976-04-23 Referring Provider: Charlett Blake, MD  Encounter Date: 10/07/2017      PT End of Session - 10/07/17 1414    Visit Number 20   Number of Visits 30   Date for PT Re-Evaluation 11/12/17   Authorization Type BCBS   PT Start Time 1316   PT Stop Time 1400   PT Time Calculation (min) 44 min   Equipment Utilized During Treatment Gait belt   Activity Tolerance Patient tolerated treatment well   Behavior During Therapy [facility] for tasks assessed/performed      Past Medical History:  Diagnosis Date  . Duodenal obstruction   . GERD (gastroesophageal reflux disease)   . Hypertension   . Stroke [facility])    april 2018    Past Surgical History:  Procedure Laterality Date  . ABDOMINAL HYSTERECTOMY    . BALLOON DILATION  12/31/2011   Procedure: BALLOON DILATION;  Surgeon: Missy Sabins, MD;  [facility] ENDOSCOPY;  Service: Endoscopy;  Laterality: N/A;  . CHOLECYSTECTOMY  07/28/11  . ESOPHAGOGASTRODUODENOSCOPY  10/17/2011   Procedure: ESOPHAGOGASTRODUODENOSCOPY (EGD);  Surgeon: Missy Sabins, MD;  [facility] END[facility]COPY;  Service: Endoscopy;  Laterality: N/A;  . IR ANGIO INTRA EXTRACRAN SEL INTERNAL CAROTID BILAT MOD SED  04/07/2017  . IR ANGIO VERTEBRAL SEL VERTEBRAL UNI R MOD SED  04/07/2017  . IR ANGIOGRAM FOLLOW UP STUDY  04/07/2017  . IR ANGIOGRAM FOLLOW UP STUDY  04/07/2017  . IR ANGIOGRAM FOLLOW UP STUDY  04/07/2017  . IR ANGIOGRAM FOLLOW UP STUDY  04/07/2017  . IR ANGIOGRAM FOLLOW UP STUDY  04/07/2017  . IR ANGIOGRAM SELECTIVE EACH ADDITIONAL VESSEL  04/07/2017  . IR TRANSCATH/EMBOLIZ  04/07/2017  . PARS PLANA VITRECTOMY Right 05/01/2017   Procedure: PARS PLANA VITRECTOMY WITH 25 GAUGE RIGHT EYE, endolaser photocoaglation;   Surgeon: Jalene Mullet, MD;  Location: [place];  Service: Ophthalmology;  Laterality: Right;  . RADIOLOGY WITH ANESTHESIA N/A 04/07/2017   Procedure: RADIOLOGY WITH ANESTHESIA;  Surgeon: Consuella Lose, MD;  Location: [place];  Service: Radiology;  Laterality: N/A;  . REPLACEMENT TOTAL KNEE BILATERAL  2004  . TEMPOROMANDIBULAR JOINT SURGERY     2 surgeries  . TUMOR REMOVAL    . VAGOTOMY  01/23/2012   Procedure: VAGOTOMY, antrectomy and BII;  Surgeon: Haywood Lasso, MD;  Location: [place];  Service: General;  Laterality: N/A;  Laparotomy with vagotomy.    Vitals:   10/07/17 1406  BP: 130/88        Subjective Assessment - 10/07/17 1406    Subjective Patient states that her legs don't feel good today. Patient has not gotten her cane yet, but plans on it soon. She gets new glasses tomorrow.   Patient is accompained by: Family member   Limitations Standing;Walking   Pain Score 0-No pain            [facility] PT Assessment - 10/07/17 0001      Berg Balance Test   Sit to Stand Able to stand  independently using hands   Standing Unsupported Able to stand safely 2 minutes   Sitting with Back Unsupported but Feet Supported on Floor or Stool Able to sit safely and securely 2 minutes   Stand to Sit Sits safely with minimal use of hands  Transfers Able to transfer safely, definite need of hands   Standing Unsupported with Eyes Closed Able to stand 10 seconds with supervision   Standing Ubsupported with Feet Together Needs help to attain position but able to stand for 30 seconds with feet together   From Standing, Reach Forward with Outstretched Arm Can reach forward >12 cm safely (5")   From Standing Position, Pick up Object from Floor Able to pick up shoe, needs supervision   From Standing Position, Turn to Look Behind Over each Shoulder Looks behind from both sides and weight shifts well   Turn 360 Degrees Needs close supervision or verbal cueing   Standing Unsupported, Alternately  Place Feet on Step/Stool Able to complete >2 steps/needs minimal assist   Standing Unsupported, One Foot in Front Able to plae foot ahead of the other independently and hold 30 seconds   Standing on One Leg Able to lift leg independently and hold equal to or more than 3 seconds   Total Score 39   Berg comment: 39/56 = significant risk             [facility] Adult PT Treatment/Exercise - 10/07/17 0001      Ambulation/Gait   Ambulation/Gait Yes   Ambulation/Gait Assistance 4: Min guard;4: Min assist  +2 for Supervision   Ambulation/Gait Assistance Details Gait with cane with quad tip with tactile cues at upper trunk for balance. VC's needed for proper sequencing. Occasional LOB, but patient is able to self-correct; min A on L for balance and min guard on R for safety. Pt improved sequencing to a step-through pattern when she relaxed and didn't overthink what she was doing.   Ambulation Distance (Feet) 115 Feet  x1, + around the gym   Assistive device Straight cane  with rubber quad tip   Gait Pattern Step-to pattern   Ambulation Surface Level;Indoor   Stairs Yes   Stairs Assistance 4: Min guard   Stairs Assistance Details (indicate cue type and reason) Pt verbalized and performed proper step-to sequencing. Pt would lose focus and perform improper sequencing.   Stair Management Technique One rail Left;Step to pattern;Forwards;With cane   Number of Stairs 8   Height of Stairs 6             PT Education - 10/07/17 1446    Education provided No          PT Short Term Goals - 10/07/17 1522      PT SHORT TERM GOAL #1   Title Pt will participate in further assessment of falls risk during gait with DGI   Baseline 10/07/17: pt not safe at this time to test this, continue to assess when appropriate   Status On-going     PT SHORT TERM GOAL #2   Title Pt will decrease falls risk with gait as indicated by increase in gait velocity with LRAD to > or = 2.0 ft/sec   Baseline 1.78  ft/sec with RW   Time 4   Period Weeks   Status On-going     PT SHORT TERM GOAL #3   Title Pt will decrease falls risk as indicated by increase in BERG balance score to > or = 45/56   Baseline 10/07/17: 39/56 scored today, not to goal   Status Not Met     PT SHORT TERM GOAL #4   Title Pt will ambulate >200' on indoor surfaces with cane (SPC with or without quad tip) and negotiate 4 stairs with one rail and  cane, alternating sequence with min A    Time 4   Period Weeks   Status On-going           PT Long Term Goals - 09/13/17 1650      PT LONG TERM GOAL #1   Title Pt and husband will demonstrate independence with HEP   Time 8   Period Weeks   Status On-going   Target Date 11/12/17     PT LONG TERM GOAL #2   Title Pt will demonstrate improved balance and decreased falls risk as indicated by BERG balance score of > or = 50/56   Time 8   Period Weeks   Status Achieved   Target Date 11/12/17     PT LONG TERM GOAL #3   Title Pt will ambulate with cane x 300' over paved outdoor surfaces (curbs and inclines) with supervision; will negotiate 4 stairs with one rail and cane with alternating sequence with supervision   Time 8   Period Weeks   Status Revised   Target Date 11/12/17     PT LONG TERM GOAL #4   Title Pt will report 15% improvement in Neuro QOL-LE   Baseline 31.3%   Time 8   Period Weeks   Status On-going   Target Date 11/12/17     PT LONG TERM GOAL #5   Title Pt will decrease falls risk during gait in home/community as indicated by increase in gait velocity to > or = 2.5 ft/sec with LRAD   Time 8   Period Weeks   Status Revised   Target Date 11/12/17     PT LONG TERM GOAL #6   Title Pt will demonstrate decreased falls risk with gait as indicated by increase in DGI score by 4 points   Baseline TBD   Time 8   Period Weeks   Status New   Target Date 11/12/17               Plan - 10/07/17 1438    Clinical Impression Statement Pt performed  39/56 on Berg, which states that she is a significant fall risk. Pt gets dizzy when bending to pick things off the floor and when she makes sudden turns, and she is looking forward to getting her glasses tomorrow to help correct her dizziness. Patient will benefit from further PT to improve balance and strength, and to become more functionally independent.     Rehab Potential Good   PT Frequency 2x / week   PT Duration 8 weeks   PT Treatment/Interventions ADLs/Self Care Home Management;Aquatic Therapy;DME Instruction;Gait training;Stair training;Functional mobility training;Therapeutic activities;Therapeutic exercise;Balance training;Neuromuscular re-education;Patient/family education;Vestibular;Visual/perceptual remediation/compensation;Cognitive remediation;Orthotic Fit/Training   PT Next Visit Plan ASSESS VITALS AT [facility] VISIT (Manual cuff works better). Have they purchased a cane yet?m"CHECK REMAINING STGs. continue to work on gait with cane-various directions and surfaces, standing balance with emphasis on weight shifting, SLS and modified tandem standing. assess DGI when appropriate.    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 Weakness with dizziness-  since [facility] and CVA 04/07/17 08/07/2017  . Disturbances of vision, late effect of stroke 08/06/2017  . Health education/counseling 08/05/2017  . High risk medications (not anticoagulants) long-term use 08/05/2017  . Neuritis-  R sided:  arm and leg/ body due to stroke 08/05/2017  . Elevated LDL cholesterol level 07/11/2017  . [employer] (NIH) Stroke Scale limb ataxia score 2, ataxia present in two limbs 06/25/2017  . 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 Elevated vitamin B12 level 05/30/2017  . Vitamin D deficiency 05/29/2017  . History of tobacco abuse-  30pk yr hx - quit 04/07/17 05/21/2017  . Gait disturbance, post-stroke 05/14/2017  . Benign essential HTN   . Vitreous hemorrhage of right eye ([place])   . Adjustment disorder with mixed anxiety and depressed mood   . 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 53/79/4327  . Terson syndrome of both eyes ([place]) 04/23/2017  . s/p SAH (subarachnoid hemorrhage) ([place]) 04/19/2017  . Basilar artery aneurysm ([place])   . Hypoxia   . Subarachnoid hemorrhage due to ruptured aneurysm ([place]) 04/07/2017  . CVA (cerebral vascular accident) ([place]) 04/07/2017  . Elevated gastrin level 01/25/2012  . Hypokalemia 01/21/2012  . Nausea & vomiting 01/20/2012  . Epigastric pain 01/20/2012  . Duodenal ulcer, acute with obstruction 10/17/2011  . S/P laparoscopic cholecystectomy 10/16/2011    Andria Meuse, SPTA 10/07/2017, 4:27 PM  [place] [address] [place], [place], [address] Phone: [phone]   Fax:  [phone]  Name: CLORINDA WYBLE MRN: [id number] Date of Birth: [birth year]  This note has been reviewed and edited by supervising CI.  Willow Ora, PTA, [place] [address] [place], [place] [address] [phone] 10/07/17, 4:37 PM

## 2017-10-07 NOTE — Therapy (Signed)
[facility] [address] [place], [place], [address] Phone: [phone]   Fax:  [phone]  Occupational Therapy Treatment  Patient Details  Name: [name] Barnett MRN: [id number] Date of Birth: [birth year] Referring Provider: Dr. Alysia Penna  Encounter Date: 10/07/2017      OT End of Session - 10/07/17 1406    Visit Number 18   Number of Visits 30   Date for OT Re-Evaluation 12/18/17   Authorization Type BCBS, no visit limit/auth req.   Authorization Time Period renewal completed 09/19/17 for 8 wks   OT Start Time 1404   OT Stop Time 1445   OT Time Calculation (min) 41 min   Activity Tolerance Patient tolerated treatment well   Behavior During Therapy WFL for tasks assessed/performed      Past Medical History:  Diagnosis Date  . Duodenal obstruction   . GERD (gastroesophageal reflux disease)   . Hypertension   . Stroke [facility])    april 2018    Past Surgical History:  Procedure Laterality Date  . ABDOMINAL HYSTERECTOMY    . BALLOON DILATION  12/31/2011   Procedure: BALLOON DILATION;  Surgeon: Missy Sabins, MD;  Location: [facility] ENDOSCOPY;  Service: Endoscopy;  Laterality: N/A;  . CHOLECYSTECTOMY  07/28/11  . ESOPHAGOGASTRODUODENOSCOPY  10/17/2011   Procedure: ESOPHAGOGASTRODUODENOSCOPY (EGD);  Surgeon: Missy Sabins, MD;  Location: [facility] ENDOSCOPY;  Service: Endoscopy;  Laterality: N/A;  . IR ANGIO INTRA EXTRACRAN SEL INTERNAL CAROTID BILAT MOD SED  04/07/2017  . IR ANGIO VERTEBRAL SEL VERTEBRAL UNI R MOD SED  04/07/2017  . IR ANGIOGRAM FOLLOW UP STUDY  04/07/2017  . IR ANGIOGRAM FOLLOW UP STUDY  04/07/2017  . IR ANGIOGRAM FOLLOW UP STUDY  04/07/2017  . IR ANGIOGRAM FOLLOW UP STUDY  04/07/2017  . IR ANGIOGRAM FOLLOW UP STUDY  04/07/2017  . IR ANGIOGRAM SELECTIVE EACH ADDITIONAL VESSEL  04/07/2017  . IR TRANSCATH/EMBOLIZ  04/07/2017  . PARS PLANA VITRECTOMY Right 05/01/2017   Procedure: PARS PLANA VITRECTOMY WITH 25 GAUGE  RIGHT EYE, endolaser photocoaglation;  Surgeon: Jalene Mullet, MD;  Location: [facility];  Service: Ophthalmology;  Laterality: Right;  . RADIOLOGY WITH ANESTHESIA N/A 04/07/2017   Procedure: RADIOLOGY WITH ANESTHESIA;  Surgeon: Consuella Lose, MD;  Location: [facility];  Service: Radiology;  Laterality: N/A;  . REPLACEMENT TOTAL KNEE BILATERAL  2004  . TEMPOROMANDIBULAR JOINT SURGERY     2 surgeries  . TUMOR REMOVAL    . VAGOTOMY  01/23/2012   Procedure: VAGOTOMY, antrectomy and BII;  Surgeon: Haywood Lasso, MD;  Location: [facility] [place];  Service: General;  Laterality: N/A;  Laparotomy with vagotomy.    There were no vitals filed for this visit.      Subjective Assessment - 10/07/17 1405    Subjective  my glasses are supposed to be in tomorrow.  really want a cane, I hate the walker      Pertinent History Subarachnoid hemorrhage due to ruptured aneurysm, ataxia (L cerebellar); HTN, R side decr sensation, vertical diplopia/visual deficits, ataxia, Bilateral retinal hemorhage associated with SAH (with surgery on R eye in hospital and L approx 1 month ago)   Limitations visual deficits, fall risk, impulsivity/cognitive deficits   Patient Stated Goals be able to walk and use L arm   Currently in Pain? No/denies       In standing, functional reaching with RUE across body and then overhead to encourage wt. Shift to the L.  Alternating with LUE functional reaching  laterally and overhead with min-mod cueing for slow/controlled movements, to manipulate large peg in L hand prior to raising arm to place in vertical pegboard for improved coordination.  Mod difficulty/drops.  Pt needed rest break after 1 row with each UE, then was able to complete a 2nd row with each UE.    Practiced drinking with LUE with focus on slow, controlled movements (styrofoam cup, filled approx 1/2 full) to work on LUE functional movements, normal movement patterns, decr impulsivity, and grading movement.  Pt with 1 small spill with  impulsivity, improved with repetition and cueing.  In quadruped, wt. Bearing through LUE with RUE lifts for incr core/scapular stability, proprioception, neuro re-ed, min cueing to slow down.  Sit>stand and stand>sit practice with focus on mid-line alignment and normal movement patterns.                         OT Short Term Goals - 10/03/17 1636      OT SHORT TERM GOAL #1   Title Pt/caregiver will be independent with initial HEP.   Time 4   Period Weeks   Status Achieved  08/19/17:  with cueing from husband     OT [place] #2   Title Pt will be use LUE as nondominant assist at least 50% of the time for ADLs without cueing.   Time 4   Period Weeks   Status Achieved  08/19/17:  approx 40% of the time.  09/19/17:  90%     OT SHORT TERM GOAL #3   Title Pt will improve LUE coordination/control to improve score on box and blocks test by at least 6 with LUE.   Baseline 19 blocks   Time 4   Period Weeks   Status Achieved  08/22/17:  18, 20 blocks.  09/19/17:  25 blocks     OT SHORT TERM GOAL #4   Title Pt will perform simple home maintenance task/snack prep in sitting with set-up.   Time 4   Period Weeks   Status Achieved  08/19/17:  met in clinic with set-up and cueing/prompts for encouragement/to slow down     OT SHORT TERM GOAL #5   Title Pt/husband will verbalize understanding of visual compensation stategies for improved safety/incr ease with ADLs.   Time 4   Period Weeks   Status Achieved     OT SHORT TERM GOAL #6   Title Pt will perform simple environmental scanning/navigation with CGA.--check STGs 10/18/17   Time 4   Period Weeks   Status New     OT SHORT TERM GOAL #7   Title Pt will complete functional task with LUE with no more than min cueing for impulsivity.     Time 4   Period Weeks   Status New           OT Long Term Goals - 10/03/17 1636      OT LONG TERM GOAL #1   Title Pt/caregiver will be independent with updated  HEP.--check LTGs 12/18/16   Time 8   Period Weeks   Status On-going     OT LONG TERM GOAL #2   Title Pt will be use LUE as nondominant assist at least 75% of the time for ADLs without cueing.   Time 8   Period Weeks   Status Achieved  uses 75% x however needs prompting at times.  09/19/17:  met per pt  90%     OT LONG TERM  GOAL #3   Title Pt will perform toileting mod I (including transfer).   Time 8   Period Weeks   Status On-going  supervision.  09/19/17:  continues to need superivsion for mobility     OT LONG TERM GOAL #4   Title Pt will improve LUE coordination/control to improve score on box and blocks test by at least 12 with LUE.   Baseline 19 blocks   Time 8   Period Weeks   Status On-going  18, 19 blocks.  09/19/17:  25 blocks     OT LONG TERM GOAL #5   Title Pt will perform simple home maintenance task/snack prep in standing with set-up/supervision.   Time 8   Period Weeks   Status On-going  Pt is not perfoming consistely, she has made a sandwich in seated     OT LONG TERM GOAL #6   Title Pt will perform simple environmental scanning/navigation with supervision.   Time 8   Period Weeks   Status On-going               Plan - 10/07/17 1407    Clinical Impression Statement Pt continues to progress towards goals with improving LUE functional use.  Pt continues to need curing for wt. shift to the L for improved midline alignment during standing tasks and transfers.   Rehab Potential Good   Current Impairments/barriers affecting progress: cognitive deficits, impulsivity   OT Frequency 2x / week   OT Duration 8 weeks   OT Treatment/Interventions Self-care/ADL training;DME and/or AE instruction;Patient/family education;Therapeutic exercises;Balance training;Fluidtherapy;Moist Heat;Ultrasound;Therapeutic exercise;Therapeutic activities;Cryotherapy;Neuromuscular education;Functional Mobility Training;Passive range of motion;Cognitive  remediation/compensation;Visual/perceptual remediation/compensation;Manual Therapy   Plan simple environmental scanning/navigation with glasses, LUE functional use   Consulted and Agree with Plan of Care Patient;Family member/caregiver   Family Member Consulted husband      Patient will benefit from skilled therapeutic intervention in order to improve the following deficits and impairments:  Decreased coordination, Decreased range of motion, Difficulty walking, Abnormal gait, Decreased safety awareness, Impaired sensation, Decreased knowledge of precautions, Decreased balance, Decreased knowledge of use of DME, Impaired UE functional use, Decreased cognition, Decreased mobility, Decreased strength, Impaired vision/preception, Impaired perceived functional ability  Visit Diagnosis: 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 Weakness with dizziness-  since [facility] and CVA 04/07/17 08/07/2017  . Disturbances of vision, late effect of stroke 08/06/2017  . Health education/counseling 08/05/2017  . High risk medications (not anticoagulants) long-term use 08/05/2017  . Neuritis-  R sided:  arm and leg/ body due to stroke 08/05/2017  . Elevated LDL cholesterol level 07/11/2017  . [employer] (NIH) Stroke Scale limb ataxia score 2, ataxia present in two limbs 06/25/2017  . 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 Elevated vitamin B12 level 05/30/2017  . Vitamin D deficiency 05/29/2017   . History of tobacco abuse-  30pk yr hx - quit 04/07/17 05/21/2017  . Gait disturbance, post-stroke 05/14/2017  . Benign essential HTN   . Vitreous hemorrhage of right eye ([place])   . Adjustment disorder with mixed anxiety and depressed mood   .  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 s/p SAH (subarachnoid hemorrhage) ([place]) 04/19/2017  . 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 Hypoxia   . Subarachnoid hemorrhage due to ruptured aneurysm ([place]) 04/07/2017  . CVA (cerebral vascular accident) ([place]) 04/07/2017  . Elevated gastrin level 01/25/2012  . Hypokalemia 01/21/2012  . Nausea & vomiting 01/20/2012  . Epigastric pain 01/20/2012  . Duodenal ulcer, acute with obstruction 10/17/2011  . S/P laparoscopic cholecystectomy 10/16/2011    [facility] 10/07/2017, 7:10 PM  [facility] [address] [place], [place], [address] Phone: [phone]   Fax:  [phone]  Name: [name] Barnett MRN: [id number] Date of Birth: [birth year]   Vianne Bulls, OTR/L [facility] [address] [place], [place]  [address] [phone] phone [phone] 10/07/17 7:10 PM

## 2017-10-08 ENCOUNTER — Other Ambulatory Visit: Payer: Self-pay

## 2017-10-08 ENCOUNTER — Other Ambulatory Visit: Payer: BLUE CROSS/BLUE SHIELD

## 2017-10-08 DIAGNOSIS — Z79899 Other long term (current) drug therapy: Secondary | ICD-10-CM

## 2017-10-08 DIAGNOSIS — E876 Hypokalemia: Secondary | ICD-10-CM

## 2017-10-10 ENCOUNTER — Ambulatory Visit: Payer: BLUE CROSS/BLUE SHIELD | Admitting: Physical Therapy

## 2017-10-10 ENCOUNTER — Ambulatory Visit: Payer: BLUE CROSS/BLUE SHIELD | Attending: Physical Medicine & Rehabilitation | Admitting: Occupational Therapy

## 2017-10-10 VITALS — BP 131/90

## 2017-10-10 DIAGNOSIS — R41840 Attention and concentration deficit: Secondary | ICD-10-CM | POA: Diagnosis present

## 2017-10-10 DIAGNOSIS — I69054 Hemiplegia and hemiparesis following nontraumatic subarachnoid hemorrhage affecting left non-dominant side: Secondary | ICD-10-CM | POA: Diagnosis present

## 2017-10-10 DIAGNOSIS — I69018 Other symptoms and signs involving cognitive functions following nontraumatic subarachnoid hemorrhage: Secondary | ICD-10-CM

## 2017-10-10 DIAGNOSIS — R260 Ataxic gait: Secondary | ICD-10-CM | POA: Diagnosis present

## 2017-10-10 DIAGNOSIS — R2689 Other abnormalities of gait and mobility: Secondary | ICD-10-CM

## 2017-10-10 DIAGNOSIS — R2681 Unsteadiness on feet: Secondary | ICD-10-CM | POA: Diagnosis present

## 2017-10-10 DIAGNOSIS — R41842 Visuospatial deficit: Secondary | ICD-10-CM | POA: Diagnosis present

## 2017-10-10 DIAGNOSIS — R42 Dizziness and giddiness: Secondary | ICD-10-CM | POA: Insufficient documentation

## 2017-10-10 DIAGNOSIS — R270 Ataxia, unspecified: Secondary | ICD-10-CM | POA: Diagnosis present

## 2017-10-10 DIAGNOSIS — R208 Other disturbances of skin sensation: Secondary | ICD-10-CM | POA: Insufficient documentation

## 2017-10-10 DIAGNOSIS — R278 Other lack of coordination: Secondary | ICD-10-CM | POA: Insufficient documentation

## 2017-10-10 DIAGNOSIS — R414 Neurologic neglect syndrome: Secondary | ICD-10-CM

## 2017-10-10 NOTE — Therapy (Signed)
[facility] [address] [place], [place], [address] Phone: [phone]   Fax:  [phone]  Physical Therapy Treatment  Patient Details  Name: [name] Barnett MRN: [id number] Date of Birth: April 07, 1976 Referring Provider: Charlett Blake, MD  Encounter Date: 10/10/2017      PT End of Session - 10/10/17 1524    Visit Number 21   Number of Visits 30   Date for PT Re-Evaluation 11/12/17   Authorization Type BCBS   PT Start Time 4680   PT Stop Time 1615   PT Time Calculation (min) 40 min   Equipment Utilized During Treatment Gait belt   Activity Tolerance Patient tolerated treatment well   Behavior During Therapy [facility] for tasks assessed/performed      Past Medical History:  Diagnosis Date  . Duodenal obstruction   . GERD (gastroesophageal reflux disease)   . Hypertension   . Stroke [facility])    april 2018    Past Surgical History:  Procedure Laterality Date  . ABDOMINAL HYSTERECTOMY    . BALLOON DILATION  12/31/2011   Procedure: BALLOON DILATION;  Surgeon: Missy Sabins, MD;  Location: [facility] ENDOSCOPY;  Service: Endoscopy;  Laterality: N/A;  . CHOLECYSTECTOMY  07/28/11  . ESOPHAGOGASTRODUODENOSCOPY  10/17/2011   Procedure: ESOPHAGOGASTRODUODENOSCOPY (EGD);  Surgeon: Missy Sabins, MD;  Location: [facility] ENDOSCOPY;  Service: Endoscopy;  Laterality: N/A;  . IR ANGIO INTRA EXTRACRAN SEL INTERNAL CAROTID BILAT MOD SED  04/07/2017  . IR ANGIO VERTEBRAL SEL VERTEBRAL UNI R MOD SED  04/07/2017  . IR ANGIOGRAM FOLLOW UP STUDY  04/07/2017  . IR ANGIOGRAM FOLLOW UP STUDY  04/07/2017  . IR ANGIOGRAM FOLLOW UP STUDY  04/07/2017  . IR ANGIOGRAM FOLLOW UP STUDY  04/07/2017  . IR ANGIOGRAM FOLLOW UP STUDY  04/07/2017  . IR ANGIOGRAM SELECTIVE EACH ADDITIONAL VESSEL  04/07/2017  . IR TRANSCATH/EMBOLIZ  04/07/2017  . PARS PLANA VITRECTOMY Right 05/01/2017   Procedure: PARS PLANA VITRECTOMY WITH 25 GAUGE RIGHT EYE, endolaser photocoaglation;   Surgeon: Jalene Mullet, MD;  Location: [facility];  Service: Ophthalmology;  Laterality: Right;  . RADIOLOGY WITH ANESTHESIA N/A 04/07/2017   Procedure: RADIOLOGY WITH ANESTHESIA;  Surgeon: Consuella Lose, MD;  Location: [facility];  Service: Radiology;  Laterality: N/A;  . REPLACEMENT TOTAL KNEE BILATERAL  2004  . TEMPOROMANDIBULAR JOINT SURGERY     2 surgeries  . TUMOR REMOVAL    . VAGOTOMY  01/23/2012   Procedure: VAGOTOMY, antrectomy and BII;  Surgeon: Haywood Lasso, MD;  Location: [facility] [place];  Service: General;  Laterality: N/A;  Laparotomy with vagotomy.    Vitals:   10/10/17 1628  BP: 131/90        Subjective Assessment - 10/10/17 1628    Subjective Patient has new glasses from two days ago, and they're making her feel a little dizzy. Pt states she has a small headache, rated pain at a 2/10.   Patient is accompained by: Family member   Limitations Standing;Walking   Currently in Pain? Yes   Pain Score 2    Pain Location Head   Pain Descriptors / Indicators Headache   Pain Onset More than a month ago             [facility] Adult PT Treatment/Exercise - 10/10/17 0001      Transfers   Transfers Sit to Stand;Stand to Sit   Sit to Stand 4: Min assist;Without upper extremity assist   Sit to Stand Details Verbal  cues for technique;Verbal cues for precautions/safety   Stand to Sit 4: Min guard;Without upper extremity assist   Comments Modified tandem sit/stand Rt. foot back and left foot forward, x10     Ambulation/Gait   Ambulation/Gait Yes   Ambulation/Gait Assistance 4: Min assist   Ambulation/Gait Assistance Details Gait with cane with quad tip with VC's for proper sequencing and pacing. Occasional LOB, but pt is able to self-correct. Min A on L for balance and safety. Pt improved sequencing when she engaged in conversation to help her from overthinking what she was doing.   Ambulation Distance (Feet) 115 Feet   Assistive device Straight cane  (with rubber quad tip)   Gait  Pattern Step-to pattern   Ambulation Surface Level;Indoor   Stairs Yes   Stairs Assistance 4: Min guard   Stairs Assistance Details (indicate cue type and reason) Pt verbalized and performed proper step-to sequencing.   Stair Management Technique One rail Right;Step to pattern;Forwards   Number of Stairs 16   Height of Stairs 6   Ramp 4: Min assist   Ramp Details (indicate cue type and reason) VC's given for proper sequencing and weight shift for ramp ascent and descent, x1, Min A for safety.    Gait Comments Pt's left (weaker) foot would "catch" when descending stairs. Pt was instructed to bend forward when lowering L LE to help improve clearance. Also performed lateral stepping to Lt and Rt with cane to focus on weight shifting, motor planning and coordination.              PT Short Term Goals - 10/07/17 1522      PT SHORT TERM GOAL #1   Title Pt will participate in further assessment of falls risk during gait with DGI   Baseline 10/07/17: pt not safe at this time to test this, continue to assess when appropriate   Status On-going     PT SHORT TERM GOAL #2   Title Pt will decrease falls risk with gait as indicated by increase in gait velocity with LRAD to > or = 2.0 ft/sec   Baseline 1.78 ft/sec with RW   Time 4   Period Weeks   Status On-going     PT SHORT TERM GOAL #3   Title Pt will decrease falls risk as indicated by increase in BERG balance score to > or = 45/56   Baseline 10/07/17: 39/56 scored today, not to goal   Status Not Met     PT SHORT TERM GOAL #4   Title Pt will ambulate >200' on indoor surfaces with cane (SPC with or without quad tip) and negotiate 4 stairs with one rail and cane, alternating sequence with min A    Time 4   Period Weeks   Status On-going           PT Long Term Goals - 09/13/17 1650      PT LONG TERM GOAL #1   Title Pt and husband will demonstrate independence with HEP   Time 8   Period Weeks   Status On-going   Target Date  11/12/17     PT LONG TERM GOAL #2   Title Pt will demonstrate improved balance and decreased falls risk as indicated by BERG balance score of > or = 50/56   Time 8   Period Weeks   Status Achieved   Target Date 11/12/17     PT LONG TERM GOAL #3   Title Pt will ambulate with cane  x 300' over paved outdoor surfaces (curbs and inclines) with supervision; will negotiate 4 stairs with one rail and cane with alternating sequence with supervision   Time 8   Period Weeks   Status Revised   Target Date 11/12/17     PT LONG TERM GOAL #4   Title Pt will report 15% improvement in Neuro QOL-LE   Baseline 31.3%   Time 8   Period Weeks   Status On-going   Target Date 11/12/17     PT LONG TERM GOAL #5   Title Pt will decrease falls risk during gait in home/community as indicated by increase in gait velocity to > or = 2.5 ft/sec with LRAD   Time 8   Period Weeks   Status Revised   Target Date 11/12/17     PT LONG TERM GOAL #6   Title Pt will demonstrate decreased falls risk with gait as indicated by increase in DGI score by 4 points   Baseline TBD   Time 8   Period Weeks   Status New   Target Date 11/12/17               Plan - 10/10/17 1653    Clinical Impression Statement Pt performed well on stairs with Min A, holding only one rail and without using cane. Pt struggled with modified tandem sit/stand and will benefit from further practice for balance, strengthening and independent function. Pt will continue to benefit from further PT sessions.   Rehab Potential Good   PT Frequency 2x / week   PT Duration 8 weeks   PT Treatment/Interventions ADLs/Self Care Home Management;Aquatic Therapy;DME Instruction;Gait training;Stair training;Functional mobility training;Therapeutic activities;Therapeutic exercise;Balance training;Neuromuscular re-education;Patient/family education;Vestibular;Visual/perceptual remediation/compensation;Cognitive remediation;Orthotic Fit/Training   PT Next  Visit Plan ASSESS VITALS AT [facility] VISIT (manual cuff works better). Have they purchased cane yet? Continue to work on gait with cane in various directions and surfaces. Sit to stand in modified tandem, standing balance with weight shifting. Stairs training. Assess DGI when appropriate.    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 Weakness with dizziness-  since [facility] and CVA 04/07/17 08/07/2017  . Disturbances of vision, late effect of stroke 08/06/2017  . Health education/counseling 08/05/2017  . High risk medications (not anticoagulants) long-term use 08/05/2017  . Neuritis-  R sided:  arm and leg/ body due to stroke 08/05/2017  . Elevated LDL cholesterol level 07/11/2017  . [employer] (NIH) Stroke Scale limb ataxia score 2, ataxia present in two limbs 06/25/2017  . 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 Elevated vitamin B12 level 05/30/2017  . Vitamin D deficiency 05/29/2017  . History of tobacco abuse-  30pk yr hx - quit 04/07/17 05/21/2017  . Gait disturbance, post-stroke 05/14/2017  . Benign essential HTN   . Vitreous hemorrhage of right eye ([place])   . Adjustment disorder with  mixed anxiety and depressed mood   . 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 s/p SAH (subarachnoid hemorrhage) ([place]) 04/19/2017  . 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 Hypoxia   . Subarachnoid hemorrhage due to ruptured aneurysm ([place]) 04/07/2017  . CVA (cerebral vascular accident) ([place]) 04/07/2017  . Elevated gastrin level 01/25/2012  . Hypokalemia 01/21/2012  . Nausea & vomiting 01/20/2012  . Epigastric pain 01/20/2012  . Duodenal ulcer, acute with obstruction 10/17/2011  . S/P laparoscopic cholecystectomy 10/16/2011    Andria Meuse, SPTA 10/10/2017, 5:03 PM  [facility] [address] [place], [place], [address] Phone: [phone]   Fax:  [phone]  Name: [name] Barnett MRN: [id number] Date of Birth: [birth year]

## 2017-10-10 NOTE — Therapy (Signed)
[facility] [address] [place], [place], [address] Phone: [phone]   Fax:  [phone]  Occupational Therapy Treatment  Patient Details  Name: [name] Barnett MRN: [id number] Date of Birth: 1976/08/21 Referring Provider: Dr. Alysia Penna  Encounter Date: 10/10/2017      OT End of Session - 10/10/17 1637    Visit Number 19   Number of Visits 30   Date for OT Re-Evaluation 12/18/17   Authorization Type [employer], no visit limit/auth req.   Authorization Time Period renewal completed 09/19/17 for 8 wks   OT Start Time 1451   OT Stop Time 1534   OT Time Calculation (min) 43 min   Activity Tolerance Patient tolerated treatment well   Behavior During Therapy WFL for tasks assessed/performed      Past Medical History:  Diagnosis Date  . Duodenal obstruction   . GERD (gastroesophageal reflux disease)   . Hypertension   . Stroke [facility])    april 2018    Past Surgical History:  Procedure Laterality Date  . ABDOMINAL HYSTERECTOMY    . BALLOON DILATION  12/31/2011   Procedure: BALLOON DILATION;  Surgeon: Missy Sabins, MD;  Location: [facility] ENDOSCOPY;  Service: Endoscopy;  Laterality: N/A;  . CHOLECYSTECTOMY  07/28/11  . ESOPHAGOGASTRODUODENOSCOPY  10/17/2011   Procedure: ESOPHAGOGASTRODUODENOSCOPY (EGD);  Surgeon: Missy Sabins, MD;  Location: [facility] ENDOSCOPY;  Service: Endoscopy;  Laterality: N/A;  . IR ANGIO INTRA EXTRACRAN SEL INTERNAL CAROTID BILAT MOD SED  04/07/2017  . IR ANGIO VERTEBRAL SEL VERTEBRAL UNI R MOD SED  04/07/2017  . IR ANGIOGRAM FOLLOW UP STUDY  04/07/2017  . IR ANGIOGRAM FOLLOW UP STUDY  04/07/2017  . IR ANGIOGRAM FOLLOW UP STUDY  04/07/2017  . IR ANGIOGRAM FOLLOW UP STUDY  04/07/2017  . IR ANGIOGRAM FOLLOW UP STUDY  04/07/2017  . IR ANGIOGRAM SELECTIVE EACH ADDITIONAL VESSEL  04/07/2017  . IR TRANSCATH/EMBOLIZ  04/07/2017  . PARS PLANA VITRECTOMY Right 05/01/2017   Procedure: PARS PLANA VITRECTOMY WITH 25 GAUGE  RIGHT EYE, endolaser photocoaglation;  Surgeon: Jalene Mullet, MD;  Location: [facility];  Service: Ophthalmology;  Laterality: Right;  . RADIOLOGY WITH ANESTHESIA N/A 04/07/2017   Procedure: RADIOLOGY WITH ANESTHESIA;  Surgeon: Consuella Lose, MD;  Location: [facility];  Service: Radiology;  Laterality: N/A;  . REPLACEMENT TOTAL KNEE BILATERAL  2004  . TEMPOROMANDIBULAR JOINT SURGERY     2 surgeries  . TUMOR REMOVAL    . VAGOTOMY  01/23/2012   Procedure: VAGOTOMY, antrectomy and BII;  Surgeon: Haywood Lasso, MD;  Location: [facility];  Service: General;  Laterality: N/A;  Laparotomy with vagotomy.    There were no vitals filed for this visit.      Subjective Assessment - 10/10/17 1454    Subjective  Pt reports that glasses make her feel dizzy and nauseous, particularly with head movements, but that things are much clearer.   Pertinent History Subarachnoid hemorrhage due to ruptured aneurysm, ataxia (L cerebellar); HTN, R side decr sensation, vertical diplopia/visual deficits, ataxia, Bilateral retinal hemorhage associated with SAH (with surgery on R eye in hospital and L approx 1 month ago)   Limitations visual deficits, fall risk, impulsivity/cognitive deficits   Patient Stated Goals be able to walk and use L arm   Currently in Pain? No/denies        Pt was wearing new glasses during session.  Copying large print phone numbers where pt had to perform tabletop visual scanning in larger  range from left to right with no reports of dizziness.  Pt reports no dizziness with eye movements only with head movements.  Simple environmental scanning with ambulation with RW.  Pt needed min-mod v.c. To slow down with dizziness and CGA for balance, but pt able to navigate RW better today.  9/10 items found.  Functional reaching (mid-level) in sitting to place coins in targets with focus/mod cueing for normal movement patterns (avoid IR, use tip pinch), midline alignment in sitting, and to slow down.     Reviewed use of tub transfer bench and that use may incr independence with transfers.  Pt/husband prefer current process where pt is sitting down into tub.    Stand>sit and sit>stand with min cueing/facilitation for controlled movement and normal movement patterns with decr impulsivity.                          OT Short Term Goals - 10/03/17 1636      OT SHORT TERM GOAL #1   Title Pt/caregiver will be independent with initial HEP.   Time 4   Period Weeks   Status Achieved  08/19/17:  with cueing from husband     OT [place] #2   Title Pt will be use LUE as nondominant assist at least 50% of the time for ADLs without cueing.   Time 4   Period Weeks   Status Achieved  08/19/17:  approx 40% of the time.  09/19/17:  90%     OT SHORT TERM GOAL #3   Title Pt will improve LUE coordination/control to improve score on box and blocks test by at least 6 with LUE.   Baseline 19 blocks   Time 4   Period Weeks   Status Achieved  08/22/17:  18, 20 blocks.  09/19/17:  25 blocks     OT SHORT TERM GOAL #4   Title Pt will perform simple home maintenance task/snack prep in sitting with set-up.   Time 4   Period Weeks   Status Achieved  08/19/17:  met in clinic with set-up and cueing/prompts for encouragement/to slow down     OT SHORT TERM GOAL #5   Title Pt/husband will verbalize understanding of visual compensation stategies for improved safety/incr ease with ADLs.   Time 4   Period Weeks   Status Achieved     OT SHORT TERM GOAL #6   Title Pt will perform simple environmental scanning/navigation with CGA.--check STGs 10/18/17   Time 4   Period Weeks   Status New     OT SHORT TERM GOAL #7   Title Pt will complete functional task with LUE with no more than min cueing for impulsivity.     Time 4   Period Weeks   Status New           OT Long Term Goals - 10/03/17 1636      OT LONG TERM GOAL #1   Title Pt/caregiver will be independent with updated  HEP.--check LTGs 12/18/16   Time 8   Period Weeks   Status On-going     OT LONG TERM GOAL #2   Title Pt will be use LUE as nondominant assist at least 75% of the time for ADLs without cueing.   Time 8   Period Weeks   Status Achieved  uses 75% x however needs prompting at times.  09/19/17:  met per pt  90%     OT LONG  TERM GOAL #3   Title Pt will perform toileting mod I (including transfer).   Time 8   Period Weeks   Status On-going  supervision.  09/19/17:  continues to need superivsion for mobility     OT LONG TERM GOAL #4   Title Pt will improve LUE coordination/control to improve score on box and blocks test by at least 12 with LUE.   Baseline 19 blocks   Time 8   Period Weeks   Status On-going  18, 19 blocks.  09/19/17:  25 blocks     OT LONG TERM GOAL #5   Title Pt will perform simple home maintenance task/snack prep in standing with set-up/supervision.   Time 8   Period Weeks   Status On-going  Pt is not perfoming consistely, she has made a sandwich in seated     OT LONG TERM GOAL #6   Title Pt will perform simple environmental scanning/navigation with supervision.   Time 8   Period Weeks   Status On-going               Plan - 10/10/17 1640    Clinical Impression Statement Pt continues to progress slowly towards goals for LUE functional use and control.  Pt continues to need cueing for midline alignment, impulsivity, and normal movement patterns.   Rehab Potential Good   Current Impairments/barriers affecting progress: cognitive deficits, impulsivity   OT Frequency 2x / week   OT Duration 8 weeks   OT Treatment/Interventions Self-care/ADL training;DME and/or AE instruction;Patient/family education;Therapeutic exercises;Balance training;Fluidtherapy;Moist Heat;Ultrasound;Therapeutic exercise;Therapeutic activities;Cryotherapy;Neuromuscular education;Functional Mobility Training;Passive range of motion;Cognitive remediation/compensation;Visual/perceptual  remediation/compensation;Manual Therapy   Plan check STGs; continue with simple environmental scanning, LUE functional use/control   Consulted and Agree with Plan of Care Patient;Family member/caregiver   Family Member Consulted husband      Patient will benefit from skilled therapeutic intervention in order to improve the following deficits and impairments:  Decreased coordination, Decreased range of motion, Difficulty walking, Abnormal gait, Decreased safety awareness, Impaired sensation, Decreased knowledge of precautions, Decreased balance, Decreased knowledge of use of DME, Impaired UE functional use, Decreased cognition, Decreased mobility, Decreased strength, Impaired vision/preception, Impaired perceived functional ability  Visit Diagnosis: 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 Weakness with dizziness-  since [facility] and CVA 04/07/17 08/07/2017  . Disturbances of vision, late effect of stroke 08/06/2017  . Health education/counseling 08/05/2017  . High risk medications (not anticoagulants) long-term use 08/05/2017  . Neuritis-  R sided:  arm and leg/ body due to stroke 08/05/2017  . Elevated LDL cholesterol level 07/11/2017  . [employer] [facility] [employer] (NIH) Stroke Scale limb ataxia score 2, ataxia present in two limbs 06/25/2017  . 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 Elevated vitamin B12 level 05/30/2017  . Vitamin D deficiency 05/29/2017  . History of tobacco abuse-  30pk  yr hx - quit 04/07/17 05/21/2017  . Gait disturbance, post-stroke 05/14/2017  . Benign essential HTN   . Vitreous hemorrhage of right eye ([place])   . Adjustment disorder with mixed anxiety and depressed mood   .  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 s/p SAH (subarachnoid hemorrhage) ([place]) 04/19/2017  . 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 Hypoxia   . Subarachnoid hemorrhage due to ruptured aneurysm ([place]) 04/07/2017  . CVA (cerebral vascular accident) ([place]) 04/07/2017  . Elevated gastrin level 01/25/2012  . Hypokalemia 01/21/2012  . Nausea & vomiting 01/20/2012  . Epigastric pain 01/20/2012  . Duodenal ulcer, acute with obstruction 10/17/2011  . S/P laparoscopic cholecystectomy 10/16/2011    [facility] 10/10/2017, 4:51 PM  [facility] [address] [place], [place], [address] Phone: [phone]   Fax:  [phone]  Name: [name] Barnett MRN: [id number] Date of Birth: 25-Dec-1975   Vianne Bulls, OTR/L [facility] [address] [place], [place]  [address] [phone] phone [phone] 10/10/17 4:53 PM

## 2017-10-14 ENCOUNTER — Ambulatory Visit: Payer: BLUE CROSS/BLUE SHIELD | Admitting: Physical Therapy

## 2017-10-14 ENCOUNTER — Ambulatory Visit: Payer: BLUE CROSS/BLUE SHIELD | Admitting: Occupational Therapy

## 2017-10-15 ENCOUNTER — Encounter: Payer: Self-pay | Admitting: Physical Medicine & Rehabilitation

## 2017-10-15 ENCOUNTER — Ambulatory Visit (HOSPITAL_BASED_OUTPATIENT_CLINIC_OR_DEPARTMENT_OTHER): Payer: BLUE CROSS/BLUE SHIELD | Admitting: Physical Medicine & Rehabilitation

## 2017-10-15 ENCOUNTER — Encounter: Payer: BLUE CROSS/BLUE SHIELD | Attending: Physical Medicine & Rehabilitation

## 2017-10-15 VITALS — BP 139/76 | HR 78 | Resp 14

## 2017-10-15 DIAGNOSIS — I69393 Ataxia following cerebral infarction: Secondary | ICD-10-CM

## 2017-10-15 DIAGNOSIS — R269 Unspecified abnormalities of gait and mobility: Secondary | ICD-10-CM

## 2017-10-15 DIAGNOSIS — I69093 Ataxia following nontraumatic subarachnoid hemorrhage: Secondary | ICD-10-CM | POA: Diagnosis not present

## 2017-10-15 DIAGNOSIS — H539 Unspecified visual disturbance: Secondary | ICD-10-CM

## 2017-10-15 DIAGNOSIS — R42 Dizziness and giddiness: Secondary | ICD-10-CM | POA: Diagnosis not present

## 2017-10-15 DIAGNOSIS — R209 Unspecified disturbances of skin sensation: Secondary | ICD-10-CM

## 2017-10-15 DIAGNOSIS — I69398 Other sequelae of cerebral infarction: Secondary | ICD-10-CM

## 2017-10-15 DIAGNOSIS — I725 Aneurysm of other precerebral arteries: Secondary | ICD-10-CM | POA: Insufficient documentation

## 2017-10-15 MED ORDER — PREGABALIN 75 MG PO CAPS: 75.0000 mg | ORAL_CAPSULE | Freq: Two times a day (BID) | ORAL | 1 refills | Status: DC

## 2017-10-15 NOTE — Patient Instructions (Signed)
Once gabapentin runs out would start Lyrica 75mg  twice a day

## 2017-10-15 NOTE — Progress Notes (Signed)
Subjective:    Patient ID: [name] Barnett, female    DOB: 1976-01-21, 41 y.o.   MRN: [id number]  HPI  Patient with history of left PCA aneurysm with postoperative subarachnoid hemorrhage, bilateral vitreous hemorrhage and brainstem infarction. She has completed inpatient rehabilitation as well as home health.  She is attending outpatient rehabilitation. She is independent with her dressing with the exception of bathing and cannot get in and out of the tub without assistance.  No further falls Pt amb with quad tip cane in PT only  Complains of burning and tingling pain right upper and right lower extremity.  This has not responded well to the gabapentin In addition she has right groin pain which increases with extension of the hip.  She has some pain that shoots down the front of her thigh.  She states that this is worse with certain movements.  This started after her coiling procedure.  She had a right femoral sheath placed  On prozac cannot add duloxetine Gabapentin ineffective  Pain Inventory Average Pain 8 Pain Right Now 8 My pain is sharp, stabbing, tingling and aching  In the last 24 hours, has pain interfered with the following? General activity 1 Relation with others 1 Enjoyment of life 4 What TIME of day is your pain at its worst? all Sleep (in general) Good  Pain is worse with: walking, bending, sitting, inactivity and standing Pain improves with: pacing activities Relief from Meds: 1  Mobility walk with assistance use a cane use a walker ability to climb steps?  yes do you drive?  no transfers alone Do you have any goals in this area?  yes  Function not employed: date last employed . disabled: date disabled . I need assistance with the following:  bathing, meal prep, household duties and shopping  Neuro/Psych numbness trouble walking  Prior Studies Any changes since last visit?  no  Physicians involved in your care Any changes since last visit?   no   Family History  Problem Relation Age of Onset  . Hypertension Mother   . Restless legs syndrome Mother   . Hyperlipidemia Mother   . Heart disease Father   . Malignant hyperthermia Neg Hx    Social History   Socioeconomic History  . Marital status: Married    Spouse name: None  . Number of children: None  . Years of education: None  . Highest education level: None  Social Needs  . Financial resource strain: None  . Food insecurity - worry: None  . Food insecurity - inability: None  . Transportation needs - medical: None  . Transportation needs - non-medical: None  Occupational History  . None  Tobacco Use  . Smoking status: Former Smoker    Packs/day: 1.00    Years: 20.00    Pack years: 20.00    Types: Cigarettes    Last attempt to quit: 04/07/2017    Years since quitting: 0.5  . Smokeless tobacco: Never Used  Substance and Sexual Activity  . Alcohol use: No  . Drug use: No  . Sexual activity: Not Currently    Partners: Male  Other Topics Concern  . None  Social History Narrative   Lives in [place] with husband and 2 kids. Works as a Psychologist, prison and probation services.   Past Surgical History:  Procedure Laterality Date  . ABDOMINAL HYSTERECTOMY    . CHOLECYSTECTOMY  07/28/11  . IR ANGIO INTRA EXTRACRAN SEL INTERNAL CAROTID BILAT MOD SED  04/07/2017  . IR  ANGIO VERTEBRAL SEL VERTEBRAL UNI R MOD SED  04/07/2017  . IR ANGIOGRAM FOLLOW UP STUDY  04/07/2017  . IR ANGIOGRAM FOLLOW UP STUDY  04/07/2017  . IR ANGIOGRAM FOLLOW UP STUDY  04/07/2017  . IR ANGIOGRAM FOLLOW UP STUDY  04/07/2017  . IR ANGIOGRAM FOLLOW UP STUDY  04/07/2017  . IR ANGIOGRAM SELECTIVE EACH ADDITIONAL VESSEL  04/07/2017  . IR TRANSCATH/EMBOLIZ  04/07/2017  . REPLACEMENT TOTAL KNEE BILATERAL  2004  . TEMPOROMANDIBULAR JOINT SURGERY     2 surgeries  . TUMOR REMOVAL    . VAGOTOMY  01/23/2012   Procedure: VAGOTOMY, antrectomy and BII;  Surgeon: Currie Paris, MD;  Location: [facility] OR;  Service: General;  Laterality:  N/A;  Laparotomy with vagotomy.   Past Medical History:  Diagnosis Date  . Duodenal obstruction   . GERD (gastroesophageal reflux disease)   . Hypertension   . Stroke ([name])    april 2018   BP 139/76 (BP Location: Left Arm, Patient Position: Sitting, Barnett Size: Normal)   Pulse 78   Resp 14   SpO2 94%   Opioid Risk Score:   Fall Risk Score:  `1  Depression screen PHQ 2/9  Depression screen [facility] 2/9 08/01/2017 07/11/2017 05/14/2017  Decreased Interest 0 2 3  Down, Depressed, Hopeless 0 1 2  PHQ - 2 Score 0 3 5  Altered sleeping 0 0 3  Tired, decreased energy 1 3 3   Change in appetite 0 0 1  Feeling bad or failure about yourself  0 0 1  Trouble concentrating 0 1 2  Moving slowly or fidgety/restless 0 3 2  Suicidal thoughts 0 1 0  PHQ-9 Score 1 11 17   Difficult doing work/chores - - Very difficult    Review of Systems  Constitutional: Negative.   HENT: Negative.   Eyes: Negative.   Respiratory: Negative.   Cardiovascular: Negative.   Gastrointestinal: Negative.   Endocrine: Negative.   Genitourinary: Negative.   Musculoskeletal: Positive for arthralgias and gait problem.  Skin: Negative.   Allergic/Immunologic: Negative.   Neurological: Positive for numbness.       Tingling  Hematological: Negative.   Psychiatric/Behavioral: Negative.        Objective:   Physical Exam  Constitutional: She appears well-developed and well-nourished.  HENT:  Head: Normocephalic and atraumatic.  Eyes: Conjunctivae and EOM are normal. Pupils are equal, round, and reactive to light.  Neck: Normal range of motion.  Neurological:  Paresthesia to palpation in the right upper limb and right lower limb Motor strength is 4/5 in bilateral deltoid bicep tricep grip 4/5 in the right hip flexor knee extensor ankle dorsiflexor 4/5 and left hip flexor knee extensor ankle dorsiflexor Dysmetria left finger-nose-finger as well as heel to shin   Psychiatric: She has a normal mood and affect.   Nursing note and vitals reviewed.         Assessment & Plan:  #1.  Gait disorder secondary to CVA this is multifactorial she has truncal as well as limb ataxia. Overall improving and should be able to upgrade to a cane.  She does however continue to need an assistive device and a handicap sticker for at least 6 more months. 2.  Neuropathic pain, this also is multifactorial may be related to left PCA distribution infarction.  Also has symptoms consistent with right femoral neuropathy.  Both of these conditions may still improve over time.  We discussed medication management.  I would recommend a trial of Lyrica since the  gabapentin is not helping.  Duloxetine not a good option given that she is on fluoxetine. Return to clinic in 6 weeks

## 2017-10-16 ENCOUNTER — Encounter: Payer: Self-pay | Admitting: Occupational Therapy

## 2017-10-16 ENCOUNTER — Ambulatory Visit: Payer: BLUE CROSS/BLUE SHIELD | Admitting: Physical Therapy

## 2017-10-16 ENCOUNTER — Encounter: Payer: Self-pay | Admitting: Physical Therapy

## 2017-10-16 ENCOUNTER — Ambulatory Visit: Payer: BLUE CROSS/BLUE SHIELD | Admitting: Occupational Therapy

## 2017-10-16 VITALS — BP 122/82

## 2017-10-16 DIAGNOSIS — R260 Ataxic gait: Secondary | ICD-10-CM

## 2017-10-16 DIAGNOSIS — R2689 Other abnormalities of gait and mobility: Secondary | ICD-10-CM

## 2017-10-16 DIAGNOSIS — R42 Dizziness and giddiness: Secondary | ICD-10-CM

## 2017-10-16 DIAGNOSIS — R278 Other lack of coordination: Secondary | ICD-10-CM

## 2017-10-16 DIAGNOSIS — I69018 Other symptoms and signs involving cognitive functions following nontraumatic subarachnoid hemorrhage: Secondary | ICD-10-CM

## 2017-10-16 DIAGNOSIS — R41842 Visuospatial deficit: Secondary | ICD-10-CM

## 2017-10-16 DIAGNOSIS — R414 Neurologic neglect syndrome: Secondary | ICD-10-CM

## 2017-10-16 DIAGNOSIS — R208 Other disturbances of skin sensation: Secondary | ICD-10-CM

## 2017-10-16 DIAGNOSIS — R41840 Attention and concentration deficit: Secondary | ICD-10-CM

## 2017-10-16 DIAGNOSIS — R2681 Unsteadiness on feet: Secondary | ICD-10-CM

## 2017-10-16 DIAGNOSIS — I69054 Hemiplegia and hemiparesis following nontraumatic subarachnoid hemorrhage affecting left non-dominant side: Secondary | ICD-10-CM | POA: Diagnosis not present

## 2017-10-16 DIAGNOSIS — R270 Ataxia, unspecified: Secondary | ICD-10-CM

## 2017-10-16 NOTE — Therapy (Signed)
[facility] [address] [place], [place], [address] Phone: [phone]   Fax:  [phone]  Occupational Therapy Treatment  Patient Details  Name: [name] Barnett MRN: [id number] Date of Birth: 1976-06-17 Referring Provider: Dr. Alysia Penna   Encounter Date: 10/16/2017  OT End of Session - 10/16/17 0940    Visit Number  20    Number of Visits  30    Date for OT Re-Evaluation  11/17/18    Authorization Type  BC[employer], no visit limit/auth req.    Authorization Time Period  renewal completed 09/19/17 for 8 wks    OT Start Time  0935    OT Stop Time  1015    OT Time Calculation (min)  40 min    Activity Tolerance  Patient tolerated treatment well    Behavior During Therapy  [facility] for tasks assessed/performed       Past Medical History:  Diagnosis Date  . Duodenal obstruction   . GERD (gastroesophageal reflux disease)   . Hypertension   . Stroke [facility])    april 2018    Past Surgical History:  Procedure Laterality Date  . ABDOMINAL HYSTERECTOMY    . CHOLECYSTECTOMY  07/28/11  . IR ANGIO INTRA EXTRACRAN SEL INTERNAL CAROTID BILAT MOD SED  04/07/2017  . IR ANGIO VERTEBRAL SEL VERTEBRAL UNI R MOD SED  04/07/2017  . IR ANGIOGRAM FOLLOW UP STUDY  04/07/2017  . IR ANGIOGRAM FOLLOW UP STUDY  04/07/2017  . IR ANGIOGRAM FOLLOW UP STUDY  04/07/2017  . IR ANGIOGRAM FOLLOW UP STUDY  04/07/2017  . IR ANGIOGRAM FOLLOW UP STUDY  04/07/2017  . IR ANGIOGRAM SELECTIVE EACH ADDITIONAL VESSEL  04/07/2017  . IR TRANSCATH/EMBOLIZ  04/07/2017  . REPLACEMENT TOTAL KNEE BILATERAL  2004  . TEMPOROMANDIBULAR JOINT SURGERY     2 surgeries  . TUMOR REMOVAL    . VAGOTOMY  01/23/2012   Procedure: VAGOTOMY, antrectomy and BII;  Surgeon: Haywood Lasso, MD;  Location: [facility];  Service: General;  Laterality: N/A;  Laparotomy with vagotomy.    There were no vitals filed for this visit.  Subjective Assessment - 10/16/17 0938    Subjective   Pt  reports didn't feel well due to meds Monday    Pertinent History  Subarachnoid hemorrhage due to ruptured aneurysm, ataxia (L cerebellar); HTN, R side decr sensation, vertical diplopia/visual deficits, ataxia, Bilateral retinal hemorhage associated with SAH (with surgery on R eye in hospital and L approx 1 month ago)    Limitations  visual deficits, fall risk, impulsivity/cognitive deficits    Patient Stated Goals  be able to walk and use L arm    Currently in Pain?  No/denies        Pt was wearing new glasses during session.  Expanded tabletop visual scanning to match clock faces to digital times using bilateral UEs, but primarily using LUE to pick up pieces.  Pt with initial mod difficulty/drops with use of L hand, but improved with repetition/mod cueing.  Mod cueing overall for impulsivity with movement of LUE and eye movements.  Simple environmental scanning with ambulation with RW.  Pt needed min-mod v.c. To slow down, stay inside walker, and CGA for balance, but pt able to navigate RW better today.  9/10 items found.  Functional reaching (mid-level) in sitting to place coins in targets with focus/min-mod cueing for normal movement patterns (avoid IR, use tip pinch), midline alignment in sitting, and to slow down.  Stand>sit and sit>stand with min cueing/facilitation for controlled movement and normal movement patterns with decr impulsivity.                Balance Exercises - 10/16/17 1037      Balance Exercises: Standing   SLS with Vectors  Solid surface    Stepping Strategy  Lateral;UE support see "other standing balance" for details   see "other standing balance" for details   Retro Gait  Upper extremity support;2 reps;Limitations    Sidestepping  Upper extremity support;2 reps;Limitations    Other Standing Exercises  yoga blocks on flat side placed laterally to both sides: with cane support had pt laterally step onto block, followed by lateraly weight shifting onto  this leg, weight shifting off leg and stepping back to floor in wide stance. alternated sides with emphasis placed on step placement and weight shifting. mod assist initially downgrading to min assist for balance with cues/facilitation for the weight shifting as pt tends to over shift at times.       Balance Exercises: Standing   SLS with Vectors Limitations  2 foam bubbles in front of pt: alternating fwd toe taps toe each x 8-10 reps each side with min to mod assist for balance, (no UE support). cues on posture, weight shifitng over stance leg and to slow down for more controlled motions.     Retro Gait Limitations  along ~10 foot pathway: backward gait with cane support, mod assist initially downgrading to min assist for balance after cues/skilled instruction on sequecing and step length/placement     Sidestepping Limitations  along ~10 foot pathway with cane support only, min/mod assist for balance with cues on posture, weight shifitng and sequencing with cane          OT Short Term Goals - 10/16/17 1003      OT SHORT TERM GOAL #1   Title  Pt/caregiver will be independent with initial HEP.    Time  4    Period  Weeks    Status  Achieved 08/19/17:  with cueing from husband   08/19/17:  with cueing from husband     OT [place] #2   Title  Pt will be use LUE as nondominant assist at least 50% of the time for ADLs without cueing.    Time  4    Period  Weeks    Status  Achieved 08/19/17:  approx 40% of the time.  09/19/17:  90%   08/19/17:  approx 40% of the time.  09/19/17:  90%     OT SHORT TERM GOAL #3   Title  Pt will improve LUE coordination/control to improve score on box and blocks test by at least 6 with LUE.    Baseline  19 blocks    Time  4    Period  Weeks    Status  Achieved 08/22/17:  18, 20 blocks.  09/19/17:  25 blocks   08/22/17:  18, 20 blocks.  09/19/17:  25 blocks     OT SHORT TERM GOAL #4   Title  Pt will perform simple home maintenance task/snack prep in  sitting with set-up.    Time  4    Period  Weeks    Status  Achieved 08/19/17:  met in clinic with set-up and cueing/prompts for encouragement/to slow down   08/19/17:  met in clinic with set-up and cueing/prompts for encouragement/to slow down     OT SHORT TERM GOAL #5  Title  Pt/husband will verbalize understanding of visual compensation stategies for improved safety/incr ease with ADLs.    Time  4    Period  Weeks    Status  Achieved      OT SHORT TERM GOAL #6   Title  Pt will perform simple environmental scanning/navigation with CGA.--check STGs 10/18/17    Time  4    Period  Weeks    Status  Achieved 10/16/17:  met at approx this level   10/16/17:  met at approx this level     OT SHORT TERM GOAL #7   Title  Pt will complete functional task with LUE with no more than min cueing for impulsivity.      Time  4    Period  Weeks    Status  On-going 10/16/17:  min-mod cueing   10/16/17:  min-mod cueing       OT Long Term Goals - 10/03/17 1636      OT LONG TERM GOAL #1   Title  Pt/caregiver will be independent with updated HEP.--check LTGs 12/18/16    Time  8    Period  Weeks    Status  On-going      OT LONG TERM GOAL #2   Title  Pt will be use LUE as nondominant assist at least 75% of the time for ADLs without cueing.    Time  8    Period  Weeks    Status  Achieved uses 75% x however needs prompting at times.  09/19/17:  met per pt  90%   uses 75% x however needs prompting at times.  09/19/17:  met per pt  90%     OT LONG TERM GOAL #3   Title  Pt will perform toileting mod I (including transfer).    Time  8    Period  Weeks    Status  On-going supervision.  09/19/17:  continues to need superivsion for mobility   supervision.  09/19/17:  continues to need superivsion for mobility     OT LONG TERM GOAL #4   Title  Pt will improve LUE coordination/control to improve score on box and blocks test by at least 12 with LUE.    Baseline  19 blocks    Time  8    Period  Weeks     Status  On-going 18, 19 blocks.  09/19/17:  25 blocks   18, 19 blocks.  09/19/17:  25 blocks     OT LONG TERM GOAL #5   Title  Pt will perform simple home maintenance task/snack prep in standing with set-up/supervision.    Time  8    Period  Weeks    Status  On-going Pt is not perfoming consistely, she has made a sandwich in seated   Pt is not perfoming consistely, she has made a sandwich in seated     OT LONG TERM GOAL #6   Title  Pt will perform simple environmental scanning/navigation with supervision.    Time  8    Period  Weeks    Status  On-going            Plan - 10/16/17 0944    Clinical Impression Statement  pt is progressing towards goals with improved LUE functional use.  Impulsivity continues to be primary limiting factor.    Rehab Potential  Good    Current Impairments/barriers affecting progress:  cognitive deficits, impulsivity    OT Frequency  2x / week  OT Duration  8 weeks    OT Treatment/Interventions  Self-care/ADL training;DME and/or AE instruction;Patient/family education;Therapeutic exercises;Balance training;Fluidtherapy;Moist Heat;Ultrasound;Therapeutic exercise;Therapeutic activities;Cryotherapy;Neuromuscular education;Functional Mobility Training;Passive range of motion;Cognitive remediation/compensation;Visual/perceptual remediation/compensation;Manual Therapy    Plan  LUE functional use/control, work on slowing down/decr impulsivity, and midline alignment    OT Home Exercise Plan  Education provided:  HEP for LUE control, coordination, and functional use    Consulted and Agree with Plan of Care  Patient;Family member/caregiver    Family Member Consulted  husband       Patient will benefit from skilled therapeutic intervention in order to improve the following deficits and impairments:  Decreased coordination, Decreased range of motion, Difficulty walking, Abnormal gait, Decreased safety awareness, Impaired sensation, Decreased knowledge of  precautions, Decreased balance, Decreased knowledge of use of DME, Impaired UE functional use, Decreased cognition, Decreased mobility, Decreased strength, Impaired vision/preception, Impaired perceived functional ability  Visit Diagnosis: 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 Weakness with dizziness-  since [facility] and CVA 04/07/17 08/07/2017  . Disturbances of vision, late effect of stroke 08/06/2017  . Health education/counseling 08/05/2017  . High risk medications (not anticoagulants) long-term use 08/05/2017  . Neuritis-  R sided:  arm and leg/ body due to stroke 08/05/2017  . Elevated LDL cholesterol level 07/11/2017  . [employer] (NIH) Stroke Scale limb ataxia score 2, ataxia present in two limbs 06/25/2017  . 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 Elevated vitamin B12 level 05/30/2017  . Vitamin D deficiency 05/29/2017  . History of tobacco abuse-  30pk yr hx - quit 04/07/17 05/21/2017  . Gait disturbance, post-stroke 05/14/2017  . Benign essential HTN   . Vitreous hemorrhage of right eye ([place])   . Adjustment disorder with mixed anxiety and depressed mood   . 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 s/p SAH (subarachnoid hemorrhage) ([place]) 04/19/2017  . 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 Hypoxia   . Subarachnoid hemorrhage due to ruptured aneurysm ([place])  04/07/2017  . CVA (cerebral vascular accident) ([place]) 04/07/2017  . Elevated gastrin level 01/25/2012  . Hypokalemia 01/21/2012  . Nausea & vomiting 01/20/2012  . Epigastric pain 01/20/2012  . Duodenal ulcer, acute with obstruction 10/17/2011  . S/P laparoscopic cholecystectomy 10/16/2011    [facility] 10/16/2017, 12:32 PM  [facility] [address] [place], [place], [address] Phone: [phone]   Fax:  [phone]  Name: [name] Barnett MRN: [id number] Date of Birth: [birth year]   Vianne Bulls, OTR/L [facility] [address] [place],   [address] [phone] phone [phone] 10/16/17 12:32 PM

## 2017-10-16 NOTE — Therapy (Signed)
[facility] [address] [place], [place], [address] Phone: [phone]   Fax:  [phone]  Physical Therapy Treatment  Patient Details  Name: [name] Barnett MRN: [id number] Date of Birth: 02-Nov-1976 Referring Provider: Charlett Blake, MD   Encounter Date: 10/16/2017  PT End of Session - 10/16/17 1024    Visit Number  22    Number of Visits  30    Date for PT Re-Evaluation  11/12/17    Authorization Type  BCBS    PT Start Time  1019    PT Stop Time  1100    PT Time Calculation (min)  41 min    Equipment Utilized During Treatment  Gait belt    Activity Tolerance  Patient tolerated treatment well    Behavior During Therapy  WFL for tasks assessed/performed       Past Medical History:  Diagnosis Date  . Duodenal obstruction   . GERD (gastroesophageal reflux disease)   . Hypertension   . Stroke [facility])    april 2018    Past Surgical History:  Procedure Laterality Date  . ABDOMINAL HYSTERECTOMY    . CHOLECYSTECTOMY  07/28/11  . IR ANGIO INTRA EXTRACRAN SEL INTERNAL CAROTID BILAT MOD SED  04/07/2017  . IR ANGIO VERTEBRAL SEL VERTEBRAL UNI R MOD SED  04/07/2017  . IR ANGIOGRAM FOLLOW UP STUDY  04/07/2017  . IR ANGIOGRAM FOLLOW UP STUDY  04/07/2017  . IR ANGIOGRAM FOLLOW UP STUDY  04/07/2017  . IR ANGIOGRAM FOLLOW UP STUDY  04/07/2017  . IR ANGIOGRAM FOLLOW UP STUDY  04/07/2017  . IR ANGIOGRAM SELECTIVE EACH ADDITIONAL VESSEL  04/07/2017  . IR TRANSCATH/EMBOLIZ  04/07/2017  . REPLACEMENT TOTAL KNEE BILATERAL  2004  . TEMPOROMANDIBULAR JOINT SURGERY     2 surgeries  . TUMOR REMOVAL    . VAGOTOMY  01/23/2012   Procedure: VAGOTOMY, antrectomy and BII;  Surgeon: Haywood Lasso, MD;  Location: [facility];  Service: General;  Laterality: N/A;  Laparotomy with vagotomy.    Vitals:   10/16/17 1022  BP: 122/82    Subjective Assessment - 10/16/17 1022    Subjective  No new complaints. No falls. Wearing glasses and  reports still gets dizzy at times., mostly with looking left<>right.     Patient is accompained by:  Family member mom   mom   Limitations  Standing;Walking    Currently in Pain?  Yes    Pain Score  4     Pain Location  Leg    Pain Orientation  Right    Pain Descriptors / Indicators  Sharp;Aching;Throbbing    Pain Type  Chronic pain    Pain Radiating Towards  groin down into thigh    Pain Onset  More than a month ago    Pain Frequency  Constant    Aggravating Factors   immobility    Pain Relieving Factors  walking          [facility] Adult PT Treatment/Exercise - 10/16/17 1028      Transfers   Transfers  Sit to Stand;Stand to Sit    Sit to Stand  4: Min guard;From bed;With upper extremity assist    Sit to Stand Details  Verbal cues for technique;Verbal cues for precautions/safety    Sit to Stand Details (indicate cue type and reason)  cues to slow down and for weight shifting to decrease retropulsion on mat with standing    Stand to Sit  4:  Min guard;With upper extremity assist;To bed    Stand to Sit Details (indicate cue type and reason)  Verbal cues for sequencing;Verbal cues for safe use of DME/AE;Verbal cues for precautions/safety    Stand to Sit Details  cues to slow down, especially with approaching mat and performing 90-180 degree turns to sit on mat surface. cues on sequencing with cane      Ambulation/Gait   Ambulation/Gait  Yes    Ambulation/Gait Assistance  4: Min assist;3: Mod assist    Ambulation/Gait Assistance Details  mostly mod assist with bouts of min assist for balance. 3 short stop breaks to adjust gait pattern to correct sequencing.     Ambulation Distance (Feet)  125 Feet    Assistive device  Straight cane with rubber quad tip   with rubber quad tip   Gait Pattern  Step-through pattern;Decreased step length - right;Decreased step length - left;Ataxic;Decreased trunk rotation;Narrow base of support    Ambulation Surface  Level;Indoor          Balance  Exercises - 10/16/17 1037      Balance Exercises: Standing   SLS with Vectors  Solid surface    Stepping Strategy  Lateral;UE support see "other standing balance" for details   see "other standing balance" for details   Retro Gait  Upper extremity support;2 reps;Limitations    Sidestepping  Upper extremity support;2 reps;Limitations    Other Standing Exercises  yoga blocks on flat side placed laterally to both sides: with cane support had pt laterally step onto block, followed by lateraly weight shifting onto this leg, weight shifting off leg and stepping back to floor in wide stance. alternated sides with emphasis placed on step placement and weight shifting. mod assist initially downgrading to min assist for balance with cues/facilitation for the weight shifting as pt tends to over shift at times.       Balance Exercises: Standing   SLS with Vectors Limitations  2 foam bubbles in front of pt: alternating fwd toe taps toe each x 8-10 reps each side with min to mod assist for balance, (no UE support). cues on posture, weight shifitng over stance leg and to slow down for more controlled motions.     Retro Gait Limitations  along ~10 foot pathway: backward gait with cane support, mod assist initially downgrading to min assist for balance after cues/skilled instruction on sequecing and step length/placement     Sidestepping Limitations  along ~10 foot pathway with cane support only, min/mod assist for balance with cues on posture, weight shifitng and sequencing with cane          PT Short Term Goals - 10/07/17 1522      PT SHORT TERM GOAL #1   Title  Pt will participate in further assessment of falls risk during gait with DGI    Baseline  10/07/17: pt not safe at this time to test this, continue to assess when appropriate    Status  On-going      PT SHORT TERM GOAL #2   Title  Pt will decrease falls risk with gait as indicated by increase in gait velocity with LRAD to > or = 2.0 ft/sec     Baseline  1.78 ft/sec with RW    Time  4    Period  Weeks    Status  On-going      PT SHORT TERM GOAL #3   Title  Pt will decrease falls risk as indicated by increase in BERG  balance score to > or = 45/56    Baseline  10/07/17: 39/56 scored today, not to goal    Status  Not Met      PT SHORT TERM GOAL #4   Title  Pt will ambulate >200' on indoor surfaces with cane (SPC with or without quad tip) and negotiate 4 stairs with one rail and cane, alternating sequence with min A     Time  4    Period  Weeks    Status  On-going        PT Long Term Goals - 09/13/17 1650      PT LONG TERM GOAL #1   Title  Pt and husband will demonstrate independence with HEP    Time  8    Period  Weeks    Status  On-going    Target Date  11/12/17      PT LONG TERM GOAL #2   Title  Pt will demonstrate improved balance and decreased falls risk as indicated by BERG balance score of > or = 50/56    Time  8    Period  Weeks    Status  Achieved    Target Date  11/12/17      PT LONG TERM GOAL #3   Title  Pt will ambulate with cane x 300' over paved outdoor surfaces (curbs and inclines) with supervision; will negotiate 4 stairs with one rail and cane with alternating sequence with supervision    Time  8    Period  Weeks    Status  Revised    Target Date  11/12/17      PT LONG TERM GOAL #4   Title  Pt will report 15% improvement in Neuro QOL-LE    Baseline  31.3%    Time  8    Period  Weeks    Status  On-going    Target Date  11/12/17      PT LONG TERM GOAL #5   Title  Pt will decrease falls risk during gait in home/community as indicated by increase in gait velocity to > or = 2.5 ft/sec with LRAD    Time  8    Period  Weeks    Status  Revised    Target Date  11/12/17      PT LONG TERM GOAL #6   Title  Pt will demonstrate decreased falls risk with gait as indicated by increase in DGI score by 4 points    Baseline  TBD    Time  8    Period  Weeks    Status  New    Target Date  11/12/17         Plan - 10/16/17 1024    Clinical Impression Statement  Today's skilled session continued to address gait/balance with straight cane with rubber quad tip. Pt brought in her cane today which was ajusted for correct fit and used in session. Also addressed balance with no UE support in today's session. Pt varied in assistance needed through out session, also needed cues to slow down and on correct ex form/technique. Pt is progressing toward goals and should benefit from continued PT to progress toward unmet goals.     Rehab Potential  Good    PT Frequency  2x / week    PT Duration  8 weeks    PT Treatment/Interventions  ADLs/Self Care Home Management;Aquatic Therapy;DME Instruction;Gait training;Stair training;Functional mobility training;Therapeutic activities;Therapeutic exercise;Balance training;Neuromuscular re-education;Patient/family education;Vestibular;Visual/perceptual remediation/compensation;Cognitive remediation;Orthotic Fit/Training  PT Next Visit Plan  ASSESS VITALS AT [facility] VISIT (manual cuff works better).  Continue to work on gait with cane in various directions and surfaces. Sit to stand in modified tandem, standing balance with weight shifting. Stairs training. Assess DGI when appropriate.     Consulted and Agree with Plan of Care  Patient;Family member/caregiver    Family Member Consulted  husband       Patient will benefit from skilled therapeutic intervention in order to improve the following deficits and impairments:  Abnormal gait, Decreased balance, Decreased cognition, Decreased coordination, Decreased strength, Difficulty walking, Dizziness, Impaired sensation, Impaired vision/preception, Pain, Decreased activity tolerance, Decreased mobility  Visit Diagnosis: Dizziness and giddiness  Unsteadiness on feet  Other abnormalities of gait and mobility  Ataxic gait  Other lack of coordination     Problem List Patient Active Problem List   Diagnosis Date Noted   . 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 Weakness with dizziness-  since [facility] and CVA 04/07/17 08/07/2017  . Disturbances of vision, late effect of stroke 08/06/2017  . Health education/counseling 08/05/2017  . High risk medications (not anticoagulants) long-term use 08/05/2017  . Neuritis-  R sided:  arm and leg/ body due to stroke 08/05/2017  . Elevated LDL cholesterol level 07/11/2017  . [employer] (NIH) Stroke Scale limb ataxia score 2, ataxia present in two limbs 06/25/2017  . 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 Elevated vitamin B12 level 05/30/2017  . Vitamin D deficiency 05/29/2017  . History of tobacco abuse-  30pk yr hx - quit 04/07/17 05/21/2017  . Gait disturbance, post-stroke 05/14/2017  . Benign essential HTN   . Vitreous hemorrhage of right eye ([place])   . Adjustment disorder with mixed anxiety and depressed mood   . 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 s/p SAH (subarachnoid hemorrhage) ([place]) 04/19/2017  . 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 Hypoxia   . Subarachnoid hemorrhage due to ruptured aneurysm ([place]) 04/07/2017  . CVA (cerebral vascular accident) ([place]) 04/07/2017  . Elevated gastrin level 01/25/2012  . Hypokalemia 01/21/2012  . Nausea & vomiting 01/20/2012  . Epigastric pain 01/20/2012  . Duodenal ulcer, acute with obstruction 10/17/2011  . S/P laparoscopic cholecystectomy 10/16/2011    Willow Ora, PTA, [facility]  [facility] [address], [place], [place] [address] [phone] 10/16/17, 2:57 PM   Name: [name] Barnett MRN: [id number] Date of Birth: 01-07-1976

## 2017-10-17 ENCOUNTER — Encounter: Payer: Self-pay | Admitting: Occupational Therapy

## 2017-10-17 ENCOUNTER — Ambulatory Visit: Payer: BLUE CROSS/BLUE SHIELD | Admitting: Occupational Therapy

## 2017-10-17 ENCOUNTER — Ambulatory Visit: Payer: BLUE CROSS/BLUE SHIELD | Admitting: Physical Therapy

## 2017-10-17 VITALS — BP 119/84

## 2017-10-17 DIAGNOSIS — R278 Other lack of coordination: Secondary | ICD-10-CM

## 2017-10-17 DIAGNOSIS — R208 Other disturbances of skin sensation: Secondary | ICD-10-CM

## 2017-10-17 DIAGNOSIS — R2681 Unsteadiness on feet: Secondary | ICD-10-CM

## 2017-10-17 DIAGNOSIS — I69018 Other symptoms and signs involving cognitive functions following nontraumatic subarachnoid hemorrhage: Secondary | ICD-10-CM

## 2017-10-17 DIAGNOSIS — R270 Ataxia, unspecified: Secondary | ICD-10-CM

## 2017-10-17 DIAGNOSIS — R41840 Attention and concentration deficit: Secondary | ICD-10-CM

## 2017-10-17 DIAGNOSIS — R42 Dizziness and giddiness: Secondary | ICD-10-CM

## 2017-10-17 DIAGNOSIS — R2689 Other abnormalities of gait and mobility: Secondary | ICD-10-CM

## 2017-10-17 DIAGNOSIS — I69054 Hemiplegia and hemiparesis following nontraumatic subarachnoid hemorrhage affecting left non-dominant side: Secondary | ICD-10-CM

## 2017-10-17 DIAGNOSIS — R414 Neurologic neglect syndrome: Secondary | ICD-10-CM

## 2017-10-17 DIAGNOSIS — R41842 Visuospatial deficit: Secondary | ICD-10-CM

## 2017-10-17 DIAGNOSIS — R260 Ataxic gait: Secondary | ICD-10-CM

## 2017-10-17 NOTE — Therapy (Signed)
[facility] [address] [place], [place], [address] Phone: [phone]   Fax:  [phone]  Occupational Therapy Treatment  Patient Details  Name: [name] Barnett MRN: [id number] Date of Birth: 11-Feb-1976 Referring Provider: Dr. Alysia Penna   Encounter Date: 10/17/2017  OT End of Session - 10/17/17 1545    Visit Number  21    Number of Visits  30    Date for OT Re-Evaluation  11/17/18    Authorization Type  [employer], no visit limit/auth req.    Authorization Time Period  renewal completed 09/19/17 for 8 wks    OT Start Time  1534    OT Stop Time  1615    OT Time Calculation (min)  41 min    Activity Tolerance  Patient tolerated treatment well    Behavior During Therapy  WFL for tasks assessed/performed       Past Medical History:  Diagnosis Date  . Duodenal obstruction   . GERD (gastroesophageal reflux disease)   . Hypertension   . Stroke [facility])    april 2018    Past Surgical History:  Procedure Laterality Date  . ABDOMINAL HYSTERECTOMY    . CHOLECYSTECTOMY  07/28/11  . IR ANGIO INTRA EXTRACRAN SEL INTERNAL CAROTID BILAT MOD SED  04/07/2017  . IR ANGIO VERTEBRAL SEL VERTEBRAL UNI R MOD SED  04/07/2017  . IR ANGIOGRAM FOLLOW UP STUDY  04/07/2017  . IR ANGIOGRAM FOLLOW UP STUDY  04/07/2017  . IR ANGIOGRAM FOLLOW UP STUDY  04/07/2017  . IR ANGIOGRAM FOLLOW UP STUDY  04/07/2017  . IR ANGIOGRAM FOLLOW UP STUDY  04/07/2017  . IR ANGIOGRAM SELECTIVE EACH ADDITIONAL VESSEL  04/07/2017  . IR TRANSCATH/EMBOLIZ  04/07/2017  . REPLACEMENT TOTAL KNEE BILATERAL  2004  . TEMPOROMANDIBULAR JOINT SURGERY     2 surgeries  . TUMOR REMOVAL    . VAGOTOMY  01/23/2012   Procedure: VAGOTOMY, antrectomy and BII;  Surgeon: Haywood Lasso, MD;  Location: [place];  Service: General;  Laterality: N/A;  Laparotomy with vagotomy.    There were no vitals filed for this visit.  Subjective Assessment - 10/17/17 1534    Subjective   My mom  brought over pennies and cups.    Pertinent History  Subarachnoid hemorrhage due to ruptured aneurysm, ataxia (L cerebellar); HTN, R side decr sensation, vertical diplopia/visual deficits, ataxia, Bilateral retinal hemorhage associated with SAH (with surgery on R eye in hospital and L approx 1 month ago)    Limitations  visual deficits, fall risk, impulsivity/cognitive deficits    Patient Stated Goals  be able to walk and use L arm    Currently in Pain?  No/denies       In sitting/standing, functional reaching to place washers of various sizes on vertical pole with min-mod cueing for impulsivity, midline alignment, proper shoulder positioning with reach.  Pt with min-mod difficulty and drops.  Tabletop visual scanning to complete 24 piece puzzle with min cueing to use BUEs and incr time for visual scanning/attention.  Arm bike x21mn level 1 for reciprocal movement with min-mod cues for normal movement patterns, midline alignment, and to use slow/controlled movement.  Pt cued to keep speed <25rpms.                     OT Short Term Goals - 10/16/17 1003      OT SHORT TERM GOAL #1   Title  Pt/caregiver will be independent with initial HEP.  Time  4    Period  Weeks    Status  Achieved 08/19/17:  with cueing from husband      OT [place] #2   Title  Pt will be use LUE as nondominant assist at least 50% of the time for ADLs without cueing.    Time  4    Period  Weeks    Status  Achieved 08/19/17:  approx 40% of the time.  09/19/17:  90%      OT SHORT TERM GOAL #3   Title  Pt will improve LUE coordination/control to improve score on box and blocks test by at least 6 with LUE.    Baseline  19 blocks    Time  4    Period  Weeks    Status  Achieved 08/22/17:  18, 20 blocks.  09/19/17:  25 blocks      OT SHORT TERM GOAL #4   Title  Pt will perform simple home maintenance task/snack prep in sitting with set-up.    Time  4    Period  Weeks    Status  Achieved  08/19/17:  met in clinic with set-up and cueing/prompts for encouragement/to slow down      OT SHORT TERM GOAL #5   Title  Pt/husband will verbalize understanding of visual compensation stategies for improved safety/incr ease with ADLs.    Time  4    Period  Weeks    Status  Achieved      OT SHORT TERM GOAL #6   Title  Pt will perform simple environmental scanning/navigation with CGA.--check STGs 10/18/17    Time  4    Period  Weeks    Status  Achieved 10/16/17:  met at approx this level      OT SHORT TERM GOAL #7   Title  Pt will complete functional task with LUE with no more than min cueing for impulsivity.      Time  4    Period  Weeks    Status  On-going 10/16/17:  min-mod cueing        OT Long Term Goals - 10/03/17 1636      OT LONG TERM GOAL #1   Title  Pt/caregiver will be independent with updated HEP.--check LTGs 12/18/16    Time  8    Period  Weeks    Status  On-going      OT LONG TERM GOAL #2   Title  Pt will be use LUE as nondominant assist at least 75% of the time for ADLs without cueing.    Time  8    Period  Weeks    Status  Achieved uses 75% x however needs prompting at times.  09/19/17:  met per pt  90%      OT LONG TERM GOAL #3   Title  Pt will perform toileting mod I (including transfer).    Time  8    Period  Weeks    Status  On-going supervision.  09/19/17:  continues to need superivsion for mobility      OT LONG TERM GOAL #4   Title  Pt will improve LUE coordination/control to improve score on box and blocks test by at least 12 with LUE.    Baseline  19 blocks    Time  8    Period  Weeks    Status  On-going 18, 19 blocks.  09/19/17:  25 blocks      OT LONG  TERM GOAL #5   Title  Pt will perform simple home maintenance task/snack prep in standing with set-up/supervision.    Time  8    Period  Weeks    Status  On-going Pt is not perfoming consistely, she has made a sandwich in seated      OT LONG TERM GOAL #6   Title  Pt will perform simple  environmental scanning/navigation with supervision.    Time  8    Period  Weeks    Status  On-going            Plan - 10/17/17 1604    Clinical Impression Statement  Pt is progressing towards goals.   Pt appears to demo incr impulsivity with fatigue.  However, pt does demo improved midline alignment with standing tasks and improved shoulder positioning with less cueing.      Rehab Potential  Good    Current Impairments/barriers affecting progress:  cognitive deficits, impulsivity    OT Frequency  2x / week    OT Duration  8 weeks    OT Treatment/Interventions  Self-care/ADL training;DME and/or AE instruction;Patient/family education;Therapeutic exercises;Balance training;Fluidtherapy;Moist Heat;Ultrasound;Therapeutic exercise;Therapeutic activities;Cryotherapy;Neuromuscular education;Functional Mobility Training;Passive range of motion;Cognitive remediation/compensation;Visual/perceptual remediation/compensation;Manual Therapy    Plan  simple home maintenance/cooking task, LUE functional use    OT Home Exercise Plan  Education provided:  HEP for LUE control, coordination, and functional use    Consulted and Agree with Plan of Care  Patient;Family member/caregiver    Family Member Consulted  husband       Patient will benefit from skilled therapeutic intervention in order to improve the following deficits and impairments:  Decreased coordination, Decreased range of motion, Difficulty walking, Abnormal gait, Decreased safety awareness, Impaired sensation, Decreased knowledge of precautions, Decreased balance, Decreased knowledge of use of DME, Impaired UE functional use, Decreased cognition, Decreased mobility, Decreased strength, Impaired vision/preception, Impaired perceived functional ability  Visit Diagnosis: 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 Weakness with dizziness-  since [facility] and CVA 04/07/17 08/07/2017  . Disturbances of vision, late effect of stroke 08/06/2017  . Health education/counseling 08/05/2017  . High risk medications (not anticoagulants) long-term use 08/05/2017  . Neuritis-  R sided:  arm and leg/ body due to stroke 08/05/2017  . Elevated LDL cholesterol level 07/11/2017  . [employer] (NIH) Stroke Scale limb ataxia score 2, ataxia present in two limbs 06/25/2017  . 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 Elevated vitamin B12 level 05/30/2017  . Vitamin D deficiency 05/29/2017  . History of tobacco abuse-  30pk yr hx - quit 04/07/17 05/21/2017  . Gait disturbance, post-stroke 05/14/2017  . Benign essential HTN   . Vitreous hemorrhage of right eye ([place])   . Adjustment disorder with mixed anxiety and depressed mood   . 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 s/p SAH (subarachnoid hemorrhage) ([place]) 04/19/2017  . 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 Hypoxia   . Subarachnoid hemorrhage due  to ruptured aneurysm ([place]) 04/07/2017  . CVA (cerebral vascular accident) ([place]) 04/07/2017  . Elevated gastrin level 01/25/2012  . Hypokalemia 01/21/2012  . Nausea & vomiting 01/20/2012  . Epigastric pain 01/20/2012  . Duodenal ulcer, acute with obstruction 10/17/2011  . S/P laparoscopic cholecystectomy 10/16/2011    [facility] 10/17/2017, 4:43 PM  [facility] [address] [place], [place], [address] Phone: [phone]   Fax:  [phone]  Name: [name] Barnett MRN: [id number] Date of Birth: 1976-06-21   Vianne Bulls, OTR/L [facility] [address] [place], [place]  [address] [phone] phone [phone] 10/17/17 4:43 PM

## 2017-10-17 NOTE — Therapy (Signed)
[facility] [address] [place], [place], [address] Phone: [phone]   Fax:  [phone]  Physical Therapy Treatment  Patient Details  Name: [name] Barnett MRN: [id number] Date of Birth: 05-Feb-1976 Referring Provider: Charlett Blake, MD   Encounter Date: 10/17/2017  PT End of Session - 10/17/17 1536    Visit Number  23    Number of Visits  30    Date for PT Re-Evaluation  11/12/17    Authorization Type  BCBS    PT Start Time  1447    PT Stop Time  1530    PT Time Calculation (min)  43 min    Equipment Utilized During Treatment  Gait belt    Activity Tolerance  Patient tolerated treatment well    Behavior During Therapy  [facility] for tasks assessed/performed       Past Medical History:  Diagnosis Date  . Duodenal obstruction   . GERD (gastroesophageal reflux disease)   . Hypertension   . Stroke [facility])    april 2018    Past Surgical History:  Procedure Laterality Date  . ABDOMINAL HYSTERECTOMY    . CHOLECYSTECTOMY  07/28/11  . IR ANGIO INTRA EXTRACRAN SEL INTERNAL CAROTID BILAT MOD SED  04/07/2017  . IR ANGIO VERTEBRAL SEL VERTEBRAL UNI R MOD SED  04/07/2017  . IR ANGIOGRAM FOLLOW UP STUDY  04/07/2017  . IR ANGIOGRAM FOLLOW UP STUDY  04/07/2017  . IR ANGIOGRAM FOLLOW UP STUDY  04/07/2017  . IR ANGIOGRAM FOLLOW UP STUDY  04/07/2017  . IR ANGIOGRAM FOLLOW UP STUDY  04/07/2017  . IR ANGIOGRAM SELECTIVE EACH ADDITIONAL VESSEL  04/07/2017  . IR TRANSCATH/EMBOLIZ  04/07/2017  . REPLACEMENT TOTAL KNEE BILATERAL  2004  . TEMPOROMANDIBULAR JOINT SURGERY     2 surgeries  . TUMOR REMOVAL    . VAGOTOMY  01/23/2012   Procedure: VAGOTOMY, antrectomy and BII;  Surgeon: Haywood Lasso, MD;  Location: [place];  Service: General;  Laterality: N/A;  Laparotomy with vagotomy.    Vitals:   10/17/17 1534  BP: 119/84    Subjective Assessment - 10/17/17 1534    Subjective  No complaiints and no falls. Wearing glasses but still  gets dizzy sometimes, mostly with looking left.    Patient is accompained by:  Family member    Limitations  Standing;Walking    Currently in Pain?  No/denies                      [facility] Adult PT Treatment/Exercise - 10/17/17 0001      Transfers   Transfers  Sit to Stand;Stand to Sit    Sit to Stand  5: Supervision;Without upper extremity assist    Stand to Sit  5: Supervision      Ambulation/Gait   Ambulation/Gait  Yes    Ambulation/Gait Assistance  4: Min assist;3: Mod assist    Ambulation/Gait Assistance Details  Min A/mod A on L for balance and safety. Occasional LOB, where pt mostly able to self-correct. VC's to shift weight to L. L UE placed overhead on wall on Left to train pt to shift weight to her left side. Pt favors leaning/walking to the right.    Ambulation Distance (Feet)  220 Feet    Assistive device  Straight cane with quad tip    Gait Pattern  Step-through pattern;Decreased step length - right;Decreased step length - left;Ataxic;Decreased trunk rotation;Narrow base of support    Ambulation Surface  Level;Indoor    Stairs  Yes    Stairs Assistance  4: Min guard;4: Min assist    Stairs Assistance Details (indicate cue type and reason)  VC's and demonstration needed for proper sequencing. Pt more comfortable with step-to pattern today.    Stair Management Technique  One rail Left;One rail Right;Forwards;With cane Rail on left ascending, rail on right descending     Number of Stairs  16    Height of Stairs  6    Gait Comments  Pt instructed to shift weight forward in trunk when ascending and descending, proper placement of UE on rail, and proper sequencing for safer, more functional stair negotiation.              PT Short Term Goals - 10/17/17 1628      PT SHORT TERM GOAL #1   Title  Pt will participate in further assessment of falls risk during gait with DGI    Baseline  10/07/17: pt not safe at this time to test this, continue to assess when  appropriate    Time  4    Period  Weeks    Status  Unable to assess      PT SHORT TERM GOAL #2   Title  Pt will decrease falls risk with gait as indicated by increase in gait velocity with LRAD to > or = 2.0 ft/sec    Baseline  1.78 ft/sec with RW    Time  4    Period  Weeks    Status  Not Met      PT SHORT TERM GOAL #3   Title  Pt will decrease falls risk as indicated by increase in BERG balance score to > or = 45/56    Baseline  10/07/17: 39/56 scored today, not to goal    Time  4    Period  Weeks    Status  Not Met      PT SHORT TERM GOAL #4   Title  Pt will ambulate >200' on indoor surfaces with cane (SPC with or without quad tip) and negotiate 4 stairs with one rail and cane, alternating sequence with min A     Baseline  partially met, felt that she needed mod cues and mod A for safe gait pattern. 10/17/17     Time  4    Period  Weeks    Status  Not Met        PT Long Term Goals - 10/17/17 1634      PT LONG TERM GOAL #1   Title  Pt and husband will demonstrate independence with HEP    Time  8    Period  Weeks    Status  On-going      PT LONG TERM GOAL #2   Title  Pt will demonstrate improved balance and decreased falls risk as indicated by BERG balance score of > or = 45/56    Baseline  39/56    Time  8    Period  Weeks    Status  On-going    Target Date  11/12/17      PT LONG TERM GOAL #3   Title  Pt will ambulate with cane x 300' over paved outdoor surfaces (curbs and inclines) with supervision; will negotiate 4 stairs with one rail and cane with alternating sequence with supervision    Time  8    Period  Weeks    Status  Revised    Target  Date  11/12/17      PT LONG TERM GOAL #4   Title  Pt will report 15% improvement in Neuro QOL-LE    Baseline  31.3%    Time  8    Period  Weeks    Status  On-going    Target Date  11/12/17      PT LONG TERM GOAL #5   Title  Pt will decrease falls risk during gait in home/community as indicated by increase in gait  velocity to > or = 2.5 ft/sec with LRAD    Baseline  1.78 ft/sec with RW    Time  8    Period  Weeks    Status  Revised    Target Date  11/12/17      PT LONG TERM GOAL #6   Title  Pt will demonstrate decreased falls risk with gait as indicated by increase in DGI score by 4 points    Baseline  TBD    Time  8    Period  Weeks    Status  New            Plan - 10/17/17 1541    Clinical Impression Statement  Addressed STG #4 - patient did not meet goal because of intermittent balance loss, and improper sequencing and weight shifting. Max VC's needed for proper sequence and positioning of cane. Gait and balance performed with straight cane with rubber quad tip on indoor level surface and stairs. Pt will continue to benefit from PT to improve balance, gait, and endurance for more functional independence in the community.    Rehab Potential  Good    PT Frequency  2x / week    PT Duration  8 weeks    PT Treatment/Interventions  ADLs/Self Care Home Management;Aquatic Therapy;DME Instruction;Gait training;Stair training;Functional mobility training;Therapeutic activities;Therapeutic exercise;Balance training;Neuromuscular re-education;Patient/family education;Vestibular;Visual/perceptual remediation/compensation;Cognitive remediation;Orthotic Fit/Training    PT Next Visit Plan  ASSESS VITALS AT [facility] VISIT (manual cuff works better).  Perform sitting static and dynamic balance activities. Sit to stand in modified tandem, standing balance with weight shifting.  Assess DGI when appropriate.     Consulted and Agree with Plan of Care  Patient;Family member/caregiver    Family Member Consulted  husband       Patient will benefit from skilled therapeutic intervention in order to improve the following deficits and impairments:  Abnormal gait, Decreased balance, Decreased cognition, Decreased coordination, Decreased strength, Difficulty walking, Dizziness, Impaired sensation, Impaired vision/preception,  Pain, Decreased activity tolerance, Decreased mobility  Visit Diagnosis: Unsteadiness on feet  Other lack of coordination  Other abnormalities of gait and mobility  Dizziness and giddiness  Ataxic gait     Problem List Patient Active Problem List   Diagnosis Date Noted  . 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 Weakness with dizziness-  since [facility] and CVA 04/07/17 08/07/2017  . Disturbances of vision, late effect of stroke 08/06/2017  . Health education/counseling 08/05/2017  . High risk medications (not anticoagulants) long-term use 08/05/2017  . Neuritis-  R sided:  arm and leg/ body due to stroke 08/05/2017  . Elevated LDL cholesterol level 07/11/2017  . [employer] (NIH) Stroke Scale limb ataxia score 2, ataxia present in two limbs 06/25/2017  . 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 Elevated vitamin B12 level 05/30/2017  . Vitamin D deficiency 05/29/2017  . History of tobacco abuse-  30pk yr hx - quit 04/07/17 05/21/2017  . Gait disturbance, post-stroke 05/14/2017  .  Benign essential HTN   . Vitreous hemorrhage of right eye ([place])   . Adjustment disorder with mixed anxiety and depressed mood   . 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 s/p SAH (subarachnoid hemorrhage) ([place]) 04/19/2017  . 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 Hypoxia   . Subarachnoid hemorrhage due to ruptured aneurysm ([place]) 04/07/2017  . CVA (cerebral vascular accident) ([place]) 04/07/2017  . Elevated gastrin level 01/25/2012  . Hypokalemia 01/21/2012  . Nausea & vomiting 01/20/2012  . Epigastric pain 01/20/2012  . Duodenal ulcer, acute with obstruction 10/17/2011  . S/P laparoscopic cholecystectomy 10/16/2011    Andria Meuse, SPTA 10/17/2017, 4:50 PM  [facility] [address] [place], [place], [address] Phone: [phone]   Fax:   [phone]  Name: [name] Barnett MRN: [id number] Date of Birth: [birth year]

## 2017-10-18 ENCOUNTER — Ambulatory Visit: Payer: BLUE CROSS/BLUE SHIELD | Admitting: Physical Medicine & Rehabilitation

## 2017-10-23 ENCOUNTER — Ambulatory Visit: Payer: BLUE CROSS/BLUE SHIELD | Admitting: Occupational Therapy

## 2017-10-23 ENCOUNTER — Encounter: Payer: Self-pay | Admitting: Physical Therapy

## 2017-10-23 ENCOUNTER — Ambulatory Visit: Payer: BLUE CROSS/BLUE SHIELD | Admitting: Physical Therapy

## 2017-10-23 VITALS — BP 120/90

## 2017-10-23 DIAGNOSIS — R2689 Other abnormalities of gait and mobility: Secondary | ICD-10-CM

## 2017-10-23 DIAGNOSIS — I69054 Hemiplegia and hemiparesis following nontraumatic subarachnoid hemorrhage affecting left non-dominant side: Secondary | ICD-10-CM | POA: Diagnosis not present

## 2017-10-23 DIAGNOSIS — R260 Ataxic gait: Secondary | ICD-10-CM

## 2017-10-23 DIAGNOSIS — R2681 Unsteadiness on feet: Secondary | ICD-10-CM

## 2017-10-23 DIAGNOSIS — R278 Other lack of coordination: Secondary | ICD-10-CM

## 2017-10-23 DIAGNOSIS — R42 Dizziness and giddiness: Secondary | ICD-10-CM

## 2017-10-23 NOTE — Therapy (Signed)
[facility] [address] [place], [place], [address] Phone: [phone]   Fax:  [phone]  Physical Therapy Treatment  Patient Details  Name: [name] Barnett MRN: [id number] Date of Birth: 17-Nov-1976 Referring Provider: Charlett Blake, MD   Encounter Date: 10/23/2017  PT End of Session - 10/23/17 1812    Visit Number  24    Number of Visits  30    Date for PT Re-Evaluation  11/12/17    Authorization Type  [employer]    PT Start Time  1449    PT Stop Time  1534    PT Time Calculation (min)  45 min    Activity Tolerance  Patient tolerated treatment well    Behavior During Therapy  [facility] for tasks assessed/performed       Past Medical History:  Diagnosis Date  . Duodenal obstruction   . GERD (gastroesophageal reflux disease)   . Hypertension   . Stroke [facility])    april 2018    Past Surgical History:  Procedure Laterality Date  . ABDOMINAL HYSTERECTOMY    . CHOLECYSTECTOMY  07/28/11  . IR ANGIO INTRA EXTRACRAN SEL INTERNAL CAROTID BILAT MOD SED  04/07/2017  . IR ANGIO VERTEBRAL SEL VERTEBRAL UNI R MOD SED  04/07/2017  . IR ANGIOGRAM FOLLOW UP STUDY  04/07/2017  . IR ANGIOGRAM FOLLOW UP STUDY  04/07/2017  . IR ANGIOGRAM FOLLOW UP STUDY  04/07/2017  . IR ANGIOGRAM FOLLOW UP STUDY  04/07/2017  . IR ANGIOGRAM FOLLOW UP STUDY  04/07/2017  . IR ANGIOGRAM SELECTIVE EACH ADDITIONAL VESSEL  04/07/2017  . IR TRANSCATH/EMBOLIZ  04/07/2017  . REPLACEMENT TOTAL KNEE BILATERAL  2004  . TEMPOROMANDIBULAR JOINT SURGERY     2 surgeries  . TUMOR REMOVAL    . VAGOTOMY  01/23/2012   Procedure: VAGOTOMY, antrectomy and BII;  Surgeon: Haywood Lasso, MD;  Location: [place];  Service: General;  Laterality: N/A;  Laparotomy with vagotomy.    Vitals:   10/23/17 1455  BP: 120/90    Subjective Assessment - 10/23/17 1452    Subjective  Husband has been working more and having to sleep more so pt has not been as active the past couple of  days.  Due to inactivity pt feels more dizzy when she gets up to walk.  Gait more unsteady with RW today.    Patient is accompained by:  Family member    Limitations  Standing;Walking                      [place] Adult PT Treatment/Exercise - 10/23/17 1535      Ambulation/Gait   Ambulation/Gait  Yes    Ambulation/Gait Assistance Details  gait forwards x 2 and backwards x 1 with LUE on wall reaching up the wall to facilitate weight shift to L       Vestibular Treatment/Exercise - 10/23/17 1503      Vestibular Treatment/Exercise   Vestibular Treatment Provided  Gaze    Gaze Exercises  X1 Viewing Horizontal;X1 Viewing Vertical      X1 Viewing Horizontal   Foot Position  seated    Reps  2    Comments  15 seconds progressing to 20 seconds maximum      X1 Viewing Vertical   Foot Position  seated    Reps  2    Comments  15 seconds progressing to 25 seconds      Eye/Head Exercise Horizontal  Foot Position  seated    Reps  10    Comments  target 6 inches away; wearing prism glasses      Eye/Head Exercise Vertical   Foot Position  seated    Reps  10    Comments  target 6 inches away; wearing prism glasses         Balance Exercises - 10/23/17 1515      Balance Exercises: Standing   Rockerboard  Lateral;EO;EC seated on board focusing on control of lat weight shift        PT Education - 10/23/17 1811    Education provided  Yes    Education Details  review of gaze adaptation and compensatory saccades for vestibular training    Person(s) Educated  Patient    Methods  Explanation;Demonstration    Comprehension  Need further instruction       PT Short Term Goals - 10/17/17 1628      PT SHORT TERM GOAL #1   Title  Pt will participate in further assessment of falls risk during gait with DGI    Baseline  10/07/17: pt not safe at this time to test this, continue to assess when appropriate    Time  4    Period  Weeks    Status  Unable to assess      PT  SHORT TERM GOAL #2   Title  Pt will decrease falls risk with gait as indicated by increase in gait velocity with LRAD to > or = 2.0 ft/sec    Baseline  1.78 ft/sec with RW    Time  4    Period  Weeks    Status  Not Met      PT SHORT TERM GOAL #3   Title  Pt will decrease falls risk as indicated by increase in BERG balance score to > or = 45/56    Baseline  10/07/17: 39/56 scored today, not to goal    Time  4    Period  Weeks    Status  Not Met      PT SHORT TERM GOAL #4   Title  Pt will ambulate >200' on indoor surfaces with cane (SPC with or without quad tip) and negotiate 4 stairs with one rail and cane, alternating sequence with min A     Baseline  partially met, felt that she needed mod cues and mod A for safe gait pattern. 10/17/17     Time  4    Period  Weeks    Status  Not Met        PT Long Term Goals - 10/17/17 1634      PT LONG TERM GOAL #1   Title  Pt and husband will demonstrate independence with HEP    Time  8    Period  Weeks    Status  On-going      PT LONG TERM GOAL #2   Title  Pt will demonstrate improved balance and decreased falls risk as indicated by BERG balance score of > or = 45/56    Baseline  39/56    Time  8    Period  Weeks    Status  On-going    Target Date  11/12/17      PT LONG TERM GOAL #3   Title  Pt will ambulate with cane x 300' over paved outdoor surfaces (curbs and inclines) with supervision; will negotiate 4 stairs with one rail and cane with alternating sequence  with supervision    Time  8    Period  Weeks    Status  Revised    Target Date  11/12/17      PT LONG TERM GOAL #4   Title  Pt will report 15% improvement in Neuro QOL-LE    Baseline  31.3%    Time  8    Period  Weeks    Status  On-going    Target Date  11/12/17      PT LONG TERM GOAL #5   Title  Pt will decrease falls risk during gait in home/community as indicated by increase in gait velocity to > or = 2.5 ft/sec with LRAD    Baseline  1.78 ft/sec with RW     Time  8    Period  Weeks    Status  Revised    Target Date  11/12/17      PT LONG TERM GOAL #6   Title  Pt will demonstrate decreased falls risk with gait as indicated by increase in DGI score by 4 points    Baseline  TBD    Time  8    Period  Weeks    Status  New            Plan - 10/23/17 1812    Clinical Impression Statement  Due to pt reporting feeling more off balance due to inactivity reviewed use of gaze adaptation (x 1 viewing) and compensatory saccades while wearing prisms to facilitated use of gaze stabilization during head movement.  Pt demonstrates improved tolerance for x 1 viewing but continues to report significant difficulty coordinating compensatory eye then head movement.  Transitioned to postural control and limits of stability training during lateral weight shifting seated on rocker board with eyes open, reaching to L and R and then with eyes closed for increased proprioceptive awareness.  Required mod facilitation at trunk for elongation/shortening while maintaining head in midline; pt continues to shift too far to R side and limits weight shift to L due to impaired subjective vertical.  Continued to focus on gait with facilitation of L lateral weight shifting during forwards and retro gait.  Will continue to address and progress.     Rehab Potential  Good    PT Frequency  2x / week    PT Duration  8 weeks    PT Treatment/Interventions  ADLs/Self Care Home Management;Aquatic Therapy;DME Instruction;Gait training;Stair training;Functional mobility training;Therapeutic activities;Therapeutic exercise;Balance training;Neuromuscular re-education;Patient/family education;Vestibular;Visual/perceptual remediation/compensation;Cognitive remediation;Orthotic Fit/Training    PT Next Visit Plan  ASSESS VITALS AT [facility] VISIT (manual cuff works.  Vestibular training with x 1 viewing in sitting; compensatory saccades, seated weight shifting/postural control on rocker board.  Assess DGI  when appropriate.     Consulted and Agree with Plan of Care  Patient;Family member/caregiver    Family Member Consulted  husband       Patient will benefit from skilled therapeutic intervention in order to improve the following deficits and impairments:  Abnormal gait, Decreased balance, Decreased cognition, Decreased coordination, Decreased strength, Difficulty walking, Dizziness, Impaired sensation, Impaired vision/preception, Pain, Decreased activity tolerance, Decreased mobility  Visit Diagnosis: Ataxic gait  Other lack of coordination  Other abnormalities of gait and mobility  Unsteadiness on feet  Dizziness and giddiness     Problem List Patient Active Problem List   Diagnosis Date Noted  . 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 Weakness with dizziness-  since [facility] and CVA 04/07/17 08/07/2017  . Disturbances of vision, late effect  of stroke 08/06/2017  . Health education/counseling 08/05/2017  . High risk medications (not anticoagulants) long-term use 08/05/2017  . Neuritis-  R sided:  arm and leg/ body due to stroke 08/05/2017  . Elevated LDL cholesterol level 07/11/2017  . [employer] (NIH) Stroke Scale limb ataxia score 2, ataxia present in two limbs 06/25/2017  . 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 Elevated vitamin B12 level 05/30/2017  . Vitamin D deficiency 05/29/2017  . History of tobacco abuse-  30pk yr hx - quit 04/07/17 05/21/2017  . Gait disturbance, post-stroke 05/14/2017  . Benign essential HTN   . Vitreous hemorrhage of right eye ([place])   . Adjustment disorder with mixed anxiety and depressed mood   . 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 s/p SAH (subarachnoid hemorrhage) ([place]) 04/19/2017  . 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 Hypoxia   . Subarachnoid hemorrhage due to ruptured aneurysm ([place]) 04/07/2017  . CVA (cerebral vascular accident) ([place])  04/07/2017  . Elevated gastrin level 01/25/2012  . Hypokalemia 01/21/2012  . Nausea & vomiting 01/20/2012  . Epigastric pain 01/20/2012  . Duodenal ulcer, acute with obstruction 10/17/2011  . S/P laparoscopic cholecystectomy 10/16/2011    Rico Junker, PT, DPT 10/23/17    6:20 PM    [facility] [address] [place], [place], [address] Phone: [phone]   Fax:  [phone]  Name: [name] Barnett MRN: [id number] Date of Birth: 1976-10-26

## 2017-10-25 ENCOUNTER — Ambulatory Visit: Payer: BLUE CROSS/BLUE SHIELD | Admitting: Physical Therapy

## 2017-10-25 ENCOUNTER — Ambulatory Visit: Payer: BLUE CROSS/BLUE SHIELD | Admitting: Occupational Therapy

## 2017-10-25 DIAGNOSIS — R278 Other lack of coordination: Secondary | ICD-10-CM

## 2017-10-25 DIAGNOSIS — R41840 Attention and concentration deficit: Secondary | ICD-10-CM

## 2017-10-25 DIAGNOSIS — R42 Dizziness and giddiness: Secondary | ICD-10-CM

## 2017-10-25 DIAGNOSIS — R2689 Other abnormalities of gait and mobility: Secondary | ICD-10-CM

## 2017-10-25 DIAGNOSIS — R2681 Unsteadiness on feet: Secondary | ICD-10-CM

## 2017-10-25 DIAGNOSIS — R41842 Visuospatial deficit: Secondary | ICD-10-CM

## 2017-10-25 DIAGNOSIS — I69054 Hemiplegia and hemiparesis following nontraumatic subarachnoid hemorrhage affecting left non-dominant side: Secondary | ICD-10-CM | POA: Diagnosis not present

## 2017-10-25 DIAGNOSIS — R260 Ataxic gait: Secondary | ICD-10-CM

## 2017-10-25 NOTE — Therapy (Signed)
[facility] [address] [place], [place], [address] Phone: [phone]   Fax:  [phone]  Occupational Therapy Treatment  Patient Details  Name: [name] Barnett MRN: [id number] Date of Birth: 1976-07-20 Referring Provider: Dr. Alysia Penna   Encounter Date: 10/25/2017  OT End of Session - 10/25/17 1533    Visit Number  22    Number of Visits  30    Date for OT Re-Evaluation  11/17/18    Authorization Time Period  renewal completed 09/19/17 for 8 wks    OT Start Time  1300    OT Stop Time  1330    OT Time Calculation (min)  30 min    Activity Tolerance  Patient tolerated treatment well    Behavior During Therapy  [facility] for tasks assessed/performed       Past Medical History:  Diagnosis Date  . Duodenal obstruction   . GERD (gastroesophageal reflux disease)   . Hypertension   . Stroke [facility])    april 2018    Past Surgical History:  Procedure Laterality Date  . ABDOMINAL HYSTERECTOMY    . ANTRECTOMY N/A 01/23/2012   Performed by Haywood Lasso, MD at [facility]  . BALLOON DILATION N/A 12/31/2011   Performed by Missy Sabins, MD at [facility] ENDOSCOPY  . CHOLECYSTECTOMY  07/28/11  . ESOPHAGOGASTRODUODENOSCOPY (EGD) N/A 10/17/2011   Performed by Missy Sabins, MD at [place]  . ESOPHAGOGASTRODUODENOSCOPY (EGD) WITH ESOPHAGEAL DILATION N/A 12/31/2011   Performed by Missy Sabins, MD at [facility] ENDOSCOPY  . IR ANGIO INTRA EXTRACRAN SEL INTERNAL CAROTID BILAT MOD SED  04/07/2017  . IR ANGIO VERTEBRAL SEL VERTEBRAL UNI R MOD SED  04/07/2017  . IR ANGIOGRAM FOLLOW UP STUDY  04/07/2017  . IR ANGIOGRAM FOLLOW UP STUDY  04/07/2017  . IR ANGIOGRAM FOLLOW UP STUDY  04/07/2017  . IR ANGIOGRAM FOLLOW UP STUDY  04/07/2017  . IR ANGIOGRAM FOLLOW UP STUDY  04/07/2017  . IR ANGIOGRAM SELECTIVE EACH ADDITIONAL VESSEL  04/07/2017  . IR TRANSCATH/EMBOLIZ  04/07/2017  . PARS PLANA VITRECTOMY WITH 25 GAUGE RIGHT EYE, endolaser photocoaglation  Right 05/01/2017   Performed by Jalene Mullet, MD at [place]  . RADIOLOGY WITH ANESTHESIA N/A 04/07/2017   Performed by Consuella Lose, MD at [place]  2004  . TEMPOROMANDIBULAR JOINT SURGERY     2 surgeries  . TUMOR REMOVAL    . VAGOTOMY  01/23/2012   Procedure: VAGOTOMY, antrectomy and BII;  Surgeon: Haywood Lasso, MD;  Location: [place];  Service: General;  Laterality: N/A;  Laparotomy with vagotomy.  [name] VAGOTOMY N/A 01/23/2012   Performed by Haywood Lasso, MD at [facility] OR    There were no vitals filed for this visit.  Subjective Assessment - 10/25/17 1532    Pertinent History  Subarachnoid hemorrhage due to ruptured aneurysm, ataxia (L cerebellar); HTN, R side decr sensation, vertical diplopia/visual deficits, ataxia, Bilateral retinal hemorhage associated with SAH (with surgery on R eye in hospital and L approx 1 month ago)    Limitations  visual deficits, fall risk, impulsivity/cognitive deficits    Patient Stated Goals  be able to walk and use L arm    Currently in Pain?  Yes    Pain Score  4     Pain Location  Leg    Pain Orientation  Right    Pain Descriptors / Indicators  Aching  Pain Type  Chronic pain    Pain Onset  More than a month ago    Aggravating Factors   immobility    Multiple Pain Sites  No                 Treatment:Modified quadraped leaning over mat for cat/ cow, and then rocking forwards/ backwards, min-mod facilitation. Arm bike x 5 mins level 4 for reciprocal movement Sitting on rocker board, lateral weight shifts with trunk rotation and functional reaching to place graded clothespins on vertical antennae, mod difficulty and close supervision            OT Short Term Goals - 10/16/17 1003      OT SHORT TERM GOAL #1   Title  Pt/caregiver will be independent with initial HEP.    Time  4    Period  Weeks    Status  Achieved 08/19/17:  with cueing from husband      OT [place] #2    Title  Pt will be use LUE as nondominant assist at least 50% of the time for ADLs without cueing.    Time  4    Period  Weeks    Status  Achieved 08/19/17:  approx 40% of the time.  09/19/17:  90%      OT SHORT TERM GOAL #3   Title  Pt will improve LUE coordination/control to improve score on box and blocks test by at least 6 with LUE.    Baseline  19 blocks    Time  4    Period  Weeks    Status  Achieved 08/22/17:  18, 20 blocks.  09/19/17:  25 blocks      OT SHORT TERM GOAL #4   Title  Pt will perform simple home maintenance task/snack prep in sitting with set-up.    Time  4    Period  Weeks    Status  Achieved 08/19/17:  met in clinic with set-up and cueing/prompts for encouragement/to slow down      OT SHORT TERM GOAL #5   Title  Pt/husband will verbalize understanding of visual compensation stategies for improved safety/incr ease with ADLs.    Time  4    Period  Weeks    Status  Achieved      OT SHORT TERM GOAL #6   Title  Pt will perform simple environmental scanning/navigation with CGA.--check STGs 10/18/17    Time  4    Period  Weeks    Status  Achieved 10/16/17:  met at approx this level      OT SHORT TERM GOAL #7   Title  Pt will complete functional task with LUE with no more than min cueing for impulsivity.      Time  4    Period  Weeks    Status  On-going 10/16/17:  min-mod cueing        OT Long Term Goals - 10/03/17 1636      OT LONG TERM GOAL #1   Title  Pt/caregiver will be independent with updated HEP.--check LTGs 12/18/16    Time  8    Period  Weeks    Status  On-going      OT LONG TERM GOAL #2   Title  Pt will be use LUE as nondominant assist at least 75% of the time for ADLs without cueing.    Time  8    Period  Weeks    Status  Achieved uses 75% x however needs prompting at times.  09/19/17:  met per pt  90%      OT LONG TERM GOAL #3   Title  Pt will perform toileting mod I (including transfer).    Time  8    Period  Weeks    Status  On-going  supervision.  09/19/17:  continues to need superivsion for mobility      OT LONG TERM GOAL #4   Title  Pt will improve LUE coordination/control to improve score on box and blocks test by at least 12 with LUE.    Baseline  19 blocks    Time  8    Period  Weeks    Status  On-going 18, 19 blocks.  09/19/17:  25 blocks      OT LONG TERM GOAL #5   Title  Pt will perform simple home maintenance task/snack prep in standing with set-up/supervision.    Time  8    Period  Weeks    Status  On-going Pt is not perfoming consistely, she has made a sandwich in seated      OT LONG TERM GOAL #6   Title  Pt will perform simple environmental scanning/navigation with supervision.    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 Weakness with dizziness-  since [facility] and CVA 04/07/17 08/07/2017  . Disturbances of vision, late effect of stroke 08/06/2017  . Health education/counseling 08/05/2017  . High risk medications (not anticoagulants) long-term use 08/05/2017  . Neuritis-  R sided:  arm and leg/ body due to stroke 08/05/2017  . Elevated LDL cholesterol level 07/11/2017  . [employer] (NIH) Stroke Scale limb ataxia score 2, ataxia present in two limbs 06/25/2017  . 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 Elevated vitamin B12 level 05/30/2017  . Vitamin D deficiency 05/29/2017  . History of tobacco abuse-  30pk yr hx - quit 04/07/17 05/21/2017  . Gait  disturbance, post-stroke 05/14/2017  . Benign essential HTN   . Vitreous hemorrhage of right eye ([place])   . Adjustment disorder with mixed anxiety and depressed mood   . 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 s/p SAH (subarachnoid hemorrhage) ([place]) 04/19/2017  . 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 Hypoxia   . Subarachnoid hemorrhage due to ruptured aneurysm ([place]) 04/07/2017  . CVA (cerebral vascular accident) ([place]) 04/07/2017  . Elevated gastrin level 01/25/2012  . Hypokalemia 01/21/2012  . Nausea & vomiting 01/20/2012  . Epigastric pain 01/20/2012  . Duodenal ulcer, acute with obstruction 10/17/2011  . S/P laparoscopic cholecystectomy 10/16/2011    RINE,KATHRYN 10/25/2017, 3:35 PM  [facility] [address] [place], [place], [address] Phone: [phone]   Fax:  [phone]  Name: [name] Barnett MRN: [id number] Date of Birth: 02/12/1976

## 2017-10-25 NOTE — Therapy (Signed)
[facility] [address] [place], [place], [address] Phone: [phone]   Fax:  [phone]  Physical Therapy Treatment  Patient Details  Name: [name] Barnett MRN: [id number] Date of Birth: 04/28/1976 Referring Provider: Charlett Blake, MD   Encounter Date: 10/25/2017  PT End of Session - 10/25/17 1537    Visit Number  25    Number of Visits  30    Date for PT Re-Evaluation  11/12/17    Authorization Type  BCBS    PT Start Time  1534    PT Stop Time  1616    PT Time Calculation (min)  42 min    Equipment Utilized During Treatment  --    Activity Tolerance  Patient tolerated treatment well    Behavior During Therapy  [facility] for tasks assessed/performed       Past Medical History:  Diagnosis Date  . Duodenal obstruction   . GERD (gastroesophageal reflux disease)   . Hypertension   . Stroke [facility])    april 2018    Past Surgical History:  Procedure Laterality Date  . ABDOMINAL HYSTERECTOMY    . ANTRECTOMY N/A 01/23/2012   Performed by Haywood Lasso, MD at [place]  . BALLOON DILATION N/A 12/31/2011   Performed by Missy Sabins, MD at [facility] ENDOSCOPY  . CHOLECYSTECTOMY  07/28/11  . ESOPHAGOGASTRODUODENOSCOPY (EGD) N/A 10/17/2011   Performed by Missy Sabins, MD at [place]  . ESOPHAGOGASTRODUODENOSCOPY (EGD) WITH ESOPHAGEAL DILATION N/A 12/31/2011   Performed by Missy Sabins, MD at [facility] ENDOSCOPY  . IR ANGIO INTRA EXTRACRAN SEL INTERNAL CAROTID BILAT MOD SED  04/07/2017  . IR ANGIO VERTEBRAL SEL VERTEBRAL UNI R MOD SED  04/07/2017  . IR ANGIOGRAM FOLLOW UP STUDY  04/07/2017  . IR ANGIOGRAM FOLLOW UP STUDY  04/07/2017  . IR ANGIOGRAM FOLLOW UP STUDY  04/07/2017  . IR ANGIOGRAM FOLLOW UP STUDY  04/07/2017  . IR ANGIOGRAM FOLLOW UP STUDY  04/07/2017  . IR ANGIOGRAM SELECTIVE EACH ADDITIONAL VESSEL  04/07/2017  . IR TRANSCATH/EMBOLIZ  04/07/2017  . PARS PLANA VITRECTOMY WITH 25 GAUGE RIGHT EYE, endolaser  photocoaglation Right 05/01/2017   Performed by Jalene Mullet, MD at [place]  . RADIOLOGY WITH ANESTHESIA N/A 04/07/2017   Performed by Consuella Lose, MD at [place]  2004  . TEMPOROMANDIBULAR JOINT SURGERY     2 surgeries  . TUMOR REMOVAL    . VAGOTOMY  01/23/2012   Procedure: VAGOTOMY, antrectomy and BII;  Surgeon: Haywood Lasso, MD;  Location: [place];  Service: General;  Laterality: N/A;  Laparotomy with vagotomy.  [name] VAGOTOMY N/A 01/23/2012   Performed by Haywood Lasso, MD at [facility] OR    There were no vitals filed for this visit.  Subjective Assessment - 10/25/17 1556    Subjective  Pt states she has been walking at home with the cane, but she doesn't like it. Pt is feeling better today than she did last session.      Patient is accompained by:  Family member    Limitations  Standing;Walking    Currently in Pain?  No/denies                      [facility] Adult PT Treatment/Exercise - 10/25/17 0001      Neuro Re-ed    Neuro Re-ed Details   While on physioball and using mirror for  visual feedback, pt given verbal and tactile feedback to weight shift center to right side, then back to center, then to left side for multiple reps; then front to center to back to center for multiple reps. Performed to improve reorientation to midline. Pt had more difficulty in performing right side to center. VC's to correct posture and decrease right lateral lean.       Vestibular Treatment/Exercise - 10/25/17 1620      Vestibular Treatment/Exercise   Vestibular Treatment Provided  Gaze    Gaze Exercises  X1 Viewing Horizontal;X1 Viewing Vertical      X1 Viewing Horizontal   Foot Position  seated with wedge under right hip to facilitate weight shift    Reps  2    Comments  20 seconds progressing to 30 seconds      X1 Viewing Vertical   Foot Position  seated with wedge under right hip to facilitate weight shift    Reps  2    Comments  20  seconds      Eye/Head Exercise Horizontal   Foot Position  seated with wedge under right hip to facilitate weight shift    Reps  10    Comments  3 feet away; middle right then middle left      Eye/Head Exercise Vertical   Foot Position  seated with wedge under right hip to facilitate weight shift    Reps  10    Comments  3 feet away; middle up then middle down; incorporated compensatory saccades into sit/stand              PT Short Term Goals - 10/17/17 1628      PT SHORT TERM GOAL #1   Title  Pt will participate in further assessment of falls risk during gait with DGI    Baseline  10/07/17: pt not safe at this time to test this, continue to assess when appropriate    Time  4    Period  Weeks    Status  Unable to assess      PT SHORT TERM GOAL #2   Title  Pt will decrease falls risk with gait as indicated by increase in gait velocity with LRAD to > or = 2.0 ft/sec    Baseline  1.78 ft/sec with RW    Time  4    Period  Weeks    Status  Not Met      PT SHORT TERM GOAL #3   Title  Pt will decrease falls risk as indicated by increase in BERG balance score to > or = 45/56    Baseline  10/07/17: 39/56 scored today, not to goal    Time  4    Period  Weeks    Status  Not Met      PT SHORT TERM GOAL #4   Title  Pt will ambulate >200' on indoor surfaces with cane (SPC with or without quad tip) and negotiate 4 stairs with one rail and cane, alternating sequence with min A     Baseline  partially met, felt that she needed mod cues and mod A for safe gait pattern. 10/17/17     Time  4    Period  Weeks    Status  Not Met        PT Long Term Goals - 10/17/17 1634      PT LONG TERM GOAL #1   Title  Pt and husband will demonstrate independence with HEP  Time  8    Period  Weeks    Status  On-going      PT LONG TERM GOAL #2   Title  Pt will demonstrate improved balance and decreased falls risk as indicated by BERG balance score of > or = 45/56    Baseline  39/56     Time  8    Period  Weeks    Status  On-going    Target Date  11/12/17      PT LONG TERM GOAL #3   Title  Pt will ambulate with cane x 300' over paved outdoor surfaces (curbs and inclines) with supervision; will negotiate 4 stairs with one rail and cane with alternating sequence with supervision    Time  8    Period  Weeks    Status  Revised    Target Date  11/12/17      PT LONG TERM GOAL #4   Title  Pt will report 15% improvement in Neuro QOL-LE    Baseline  31.3%    Time  8    Period  Weeks    Status  On-going    Target Date  11/12/17      PT LONG TERM GOAL #5   Title  Pt will decrease falls risk during gait in home/community as indicated by increase in gait velocity to > or = 2.5 ft/sec with LRAD    Baseline  1.78 ft/sec with RW    Time  8    Period  Weeks    Status  Revised    Target Date  11/12/17      PT LONG TERM GOAL #6   Title  Pt will demonstrate decreased falls risk with gait as indicated by increase in DGI score by 4 points    Baseline  TBD    Time  8    Period  Weeks    Status  New            Plan - 10/25/17 1652    Clinical Impression Statement  Pt performed vestibular exercises wearing her glasses. Pt showed improvement in tolerance for x1 viewing, but continues to have difficulty coordinating compensatory eye then head movement in both vertical and horizontal directions. Pt saw prisms when moving head/eyes in vertical direction. Transitioned pt to postural control exercises on physioball while weight shifting side to side and front to back. Required moderate facilitation at trunk for elongation and stability while maintaining head and trunk in midline. Pt continues to shift to R side due to impaired subjective vertical. Pt will benefit from continued PT treatment to improve function.    Rehab Potential  Good    PT Frequency  2x / week    PT Duration  8 weeks    PT Treatment/Interventions  ADLs/Self Care Home Management;Aquatic Therapy;DME Instruction;Gait  training;Stair training;Functional mobility training;Therapeutic activities;Therapeutic exercise;Balance training;Neuromuscular re-education;Patient/family education;Vestibular;Visual/perceptual remediation/compensation;Cognitive remediation;Orthotic Fit/Training    PT Next Visit Plan  ASSESS VITALS AT [facility] VISIT. Continue with physioball exercises for postural control. Return to gait exercises. Assess DGI when appropriate.       Patient will benefit from skilled therapeutic intervention in order to improve the following deficits and impairments:  Abnormal gait, Decreased balance, Decreased cognition, Decreased coordination, Decreased strength, Difficulty walking, Dizziness, Impaired sensation, Impaired vision/preception, Pain, Decreased activity tolerance, Decreased mobility  Visit Diagnosis: Ataxic gait  Unsteadiness on feet  Dizziness and giddiness  Other lack of coordination  Other abnormalities of gait and mobility  Problem List Patient Active Problem List   Diagnosis Date Noted  . 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 Weakness with dizziness-  since [facility] and CVA 04/07/17 08/07/2017  . Disturbances of vision, late effect of stroke 08/06/2017  . Health education/counseling 08/05/2017  . High risk medications (not anticoagulants) long-term use 08/05/2017  . Neuritis-  R sided:  arm and leg/ body due to stroke 08/05/2017  . Elevated LDL cholesterol level 07/11/2017  . [employer] (NIH) Stroke Scale limb ataxia score 2, ataxia present in two limbs 06/25/2017  . 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 Elevated vitamin B12 level 05/30/2017  . Vitamin D deficiency 05/29/2017  . History of tobacco abuse-  30pk yr hx - quit 04/07/17 05/21/2017  . Gait disturbance, post-stroke 05/14/2017  . Benign essential HTN   . Vitreous hemorrhage of right eye ([place])   . Adjustment disorder with mixed anxiety and depressed mood   . 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 s/p SAH (subarachnoid hemorrhage) ([place]) 04/19/2017  . 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 Hypoxia   . Subarachnoid hemorrhage due to ruptured aneurysm ([place]) 04/07/2017  . CVA (cerebral vascular accident) ([place]) 04/07/2017  . Elevated gastrin level 01/25/2012  . Hypokalemia 01/21/2012  . Nausea & vomiting 01/20/2012  . Epigastric pain 01/20/2012  . Duodenal ulcer, acute with obstruction 10/17/2011  . S/P laparoscopic cholecystectomy 10/16/2011    Andria Meuse, SPTA 10/25/2017, 5:03 PM  [facility] [address] [place], [place], [address] Phone: [phone]   Fax:  [phone]  Name: [name] Barnett MRN: [id number] Date of Birth: Mar 04, 1976

## 2017-10-28 ENCOUNTER — Ambulatory Visit: Payer: BLUE CROSS/BLUE SHIELD | Admitting: Physical Therapy

## 2017-10-28 ENCOUNTER — Ambulatory Visit: Payer: BLUE CROSS/BLUE SHIELD | Admitting: Occupational Therapy

## 2017-10-28 DIAGNOSIS — R260 Ataxic gait: Secondary | ICD-10-CM

## 2017-10-28 DIAGNOSIS — R278 Other lack of coordination: Secondary | ICD-10-CM

## 2017-10-28 DIAGNOSIS — R41842 Visuospatial deficit: Secondary | ICD-10-CM

## 2017-10-28 DIAGNOSIS — R2681 Unsteadiness on feet: Secondary | ICD-10-CM

## 2017-10-28 DIAGNOSIS — R41840 Attention and concentration deficit: Secondary | ICD-10-CM

## 2017-10-28 DIAGNOSIS — R2689 Other abnormalities of gait and mobility: Secondary | ICD-10-CM

## 2017-10-28 DIAGNOSIS — R42 Dizziness and giddiness: Secondary | ICD-10-CM

## 2017-10-28 DIAGNOSIS — I69054 Hemiplegia and hemiparesis following nontraumatic subarachnoid hemorrhage affecting left non-dominant side: Secondary | ICD-10-CM | POA: Diagnosis not present

## 2017-10-28 NOTE — Therapy (Signed)
[facility] [address] [place], [place], [address] Phone: [phone]   Fax:  [phone]  Occupational Therapy Treatment  Patient Details  Name: [name] Barnett MRN: [id number] Date of Birth: September 07, 1976 Referring Provider: Dr. Alysia Penna   Encounter Date: 10/28/2017  OT End of Session - 10/28/17 1735    Visit Number  23    Number of Visits  30    Authorization Type  BCBS, no visit limit/auth req.    Authorization Time Period  renewal completed 09/19/17 for 8 wks    OT Start Time  1540    OT Stop Time  1615    OT Time Calculation (min)  35 min    Activity Tolerance  Patient tolerated treatment well    Behavior During Therapy  WFL for tasks assessed/performed       Past Medical History:  Diagnosis Date  . Duodenal obstruction   . GERD (gastroesophageal reflux disease)   . Hypertension   . Stroke [facility])    april 2018    Past Surgical History:  Procedure Laterality Date  . ABDOMINAL HYSTERECTOMY    . ANTRECTOMY N/A 01/23/2012   Performed by Haywood Lasso, MD at [place]  . BALLOON DILATION N/A 12/31/2011   Performed by Missy Sabins, MD at [facility] ENDOSCOPY  . CHOLECYSTECTOMY  07/28/11  . ESOPHAGOGASTRODUODENOSCOPY (EGD) N/A 10/17/2011   Performed by Missy Sabins, MD at [place]  . ESOPHAGOGASTRODUODENOSCOPY (EGD) WITH ESOPHAGEAL DILATION N/A 12/31/2011   Performed by Missy Sabins, MD at [facility] ENDOSCOPY  . IR ANGIO INTRA EXTRACRAN SEL INTERNAL CAROTID BILAT MOD SED  04/07/2017  . IR ANGIO VERTEBRAL SEL VERTEBRAL UNI R MOD SED  04/07/2017  . IR ANGIOGRAM FOLLOW UP STUDY  04/07/2017  . IR ANGIOGRAM FOLLOW UP STUDY  04/07/2017  . IR ANGIOGRAM FOLLOW UP STUDY  04/07/2017  . IR ANGIOGRAM FOLLOW UP STUDY  04/07/2017  . IR ANGIOGRAM FOLLOW UP STUDY  04/07/2017  . IR ANGIOGRAM SELECTIVE EACH ADDITIONAL VESSEL  04/07/2017  . IR TRANSCATH/EMBOLIZ  04/07/2017  . PARS PLANA VITRECTOMY WITH 25 GAUGE RIGHT EYE, endolaser  photocoaglation Right 05/01/2017   Performed by Jalene Mullet, MD at [facility]  . RADIOLOGY WITH ANESTHESIA N/A 04/07/2017   Performed by Consuella Lose, MD at [place]  2004  . TEMPOROMANDIBULAR JOINT SURGERY     2 surgeries  . TUMOR REMOVAL    . VAGOTOMY  01/23/2012   Procedure: VAGOTOMY, antrectomy and BII;  Surgeon: Haywood Lasso, MD;  Location: [facility];  Service: General;  Laterality: N/A;  Laparotomy with vagotomy.  [name] VAGOTOMY N/A 01/23/2012   Performed by Haywood Lasso, MD at [facility] OR    There were no vitals filed for this visit.  Subjective Assessment - 10/28/17 1734    Pertinent History  Subarachnoid hemorrhage due to ruptured aneurysm, ataxia (L cerebellar); HTN, R side decr sensation, vertical diplopia/visual deficits, ataxia, Bilateral retinal hemorhage associated with SAH (with surgery on R eye in hospital and L approx 1 month ago)    Limitations  visual deficits, fall risk, impulsivity/cognitive deficits    Patient Stated Goals  be able to walk and use L arm    Currently in Pain?  Yes    Pain Score  5     Pain Location  Groin    Pain Orientation  Right    Pain Descriptors / Indicators  Hervey Ard  Pain Type  Acute pain    Pain Onset  More than a month ago    Pain Frequency  Constant    Aggravating Factors   immobility    Pain Relieving Factors  walking    Multiple Pain Sites  No               Treatment: Supine closed chain shoulder flexion and diagonals with ball, min v.c / facilitation( while TENS applied to right upper leg, see PT note for parameters) no adverse reactions. Arm bike x 5 mins level 3 for conditioning Placing and removing various sized pegs from semi-circle in seated, min facilitation, mod v.c to slow down/ focus attention, mod drops              OT Short Term Goals - 10/16/17 1003      OT SHORT TERM GOAL #1   Title  Pt/caregiver will be independent with initial HEP.    Time  4    Period   Weeks    Status  Achieved 08/19/17:  with cueing from husband      OT [place] #2   Title  Pt will be use LUE as nondominant assist at least 50% of the time for ADLs without cueing.    Time  4    Period  Weeks    Status  Achieved 08/19/17:  approx 40% of the time.  09/19/17:  90%      OT SHORT TERM GOAL #3   Title  Pt will improve LUE coordination/control to improve score on box and blocks test by at least 6 with LUE.    Baseline  19 blocks    Time  4    Period  Weeks    Status  Achieved 08/22/17:  18, 20 blocks.  09/19/17:  25 blocks      OT SHORT TERM GOAL #4   Title  Pt will perform simple home maintenance task/snack prep in sitting with set-up.    Time  4    Period  Weeks    Status  Achieved 08/19/17:  met in clinic with set-up and cueing/prompts for encouragement/to slow down      OT SHORT TERM GOAL #5   Title  Pt/husband will verbalize understanding of visual compensation stategies for improved safety/incr ease with ADLs.    Time  4    Period  Weeks    Status  Achieved      OT SHORT TERM GOAL #6   Title  Pt will perform simple environmental scanning/navigation with CGA.--check STGs 10/18/17    Time  4    Period  Weeks    Status  Achieved 10/16/17:  met at approx this level      OT SHORT TERM GOAL #7   Title  Pt will complete functional task with LUE with no more than min cueing for impulsivity.      Time  4    Period  Weeks    Status  On-going 10/16/17:  min-mod cueing        OT Long Term Goals - 10/03/17 1636      OT LONG TERM GOAL #1   Title  Pt/caregiver will be independent with updated HEP.--check LTGs 12/18/16    Time  8    Period  Weeks    Status  On-going      OT LONG TERM GOAL #2   Title  Pt will be use LUE as nondominant assist at least 75%  of the time for ADLs without cueing.    Time  8    Period  Weeks    Status  Achieved uses 75% x however needs prompting at times.  09/19/17:  met per pt  90%      OT LONG TERM GOAL #3   Title  Pt will perform  toileting mod I (including transfer).    Time  8    Period  Weeks    Status  On-going supervision.  09/19/17:  continues to need superivsion for mobility      OT LONG TERM GOAL #4   Title  Pt will improve LUE coordination/control to improve score on box and blocks test by at least 12 with LUE.    Baseline  19 blocks    Time  8    Period  Weeks    Status  On-going 18, 19 blocks.  09/19/17:  25 blocks      OT LONG TERM GOAL #5   Title  Pt will perform simple home maintenance task/snack prep in standing with set-up/supervision.    Time  8    Period  Weeks    Status  On-going Pt is not perfoming consistely, she has made a sandwich in seated      OT LONG TERM GOAL #6   Title  Pt will perform simple environmental scanning/navigation with supervision.    Time  8    Period  Weeks    Status  On-going            Plan - 10/28/17 1741    Clinical Impression Statement  Pt  was limited today by significant groin pain. Pt reports improved pain after application onf TENS by PT.    Rehab Potential  Good    Current Impairments/barriers affecting progress:  cognitive deficits, impulsivity    OT Frequency  2x / week    OT Duration  8 weeks    OT Treatment/Interventions  Self-care/ADL training;DME and/or AE instruction;Patient/family education;Therapeutic exercises;Balance training;Fluidtherapy;Moist Heat;Ultrasound;Therapeutic exercise;Therapeutic activities;Cryotherapy;Neuromuscular education;Functional Mobility Training;Passive range of motion;Cognitive remediation/compensation;Visual/perceptual remediation/compensation;Manual Therapy    Plan  LUE functional use, simple home management/ cooking    OT Home Exercise Plan  Education provided:  HEP for LUE control, coordination, and functional use    Consulted and Agree with Plan of Care  Patient;Family member/caregiver    Family Member Consulted  husband       Patient will benefit from skilled therapeutic intervention in order to improve the  following deficits and impairments:  Decreased coordination, Decreased range of motion, Difficulty walking, Abnormal gait, Decreased safety awareness, Impaired sensation, Decreased knowledge of precautions, Decreased balance, Decreased knowledge of use of DME, Impaired UE functional use, Decreased cognition, Decreased mobility, Decreased strength, Impaired vision/preception, Impaired perceived functional ability  Visit Diagnosis: Other lack of coordination  Visuospatial deficit  Attention and concentration deficit    Problem List Patient Active Problem List   Diagnosis Date Noted  . 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 Weakness with dizziness-  since [facility] and CVA 04/07/17 08/07/2017  . Disturbances of vision, late effect of stroke 08/06/2017  . Health education/counseling 08/05/2017  . High risk medications (not anticoagulants) long-term use 08/05/2017  . Neuritis-  R sided:  arm and leg/ body due to stroke 08/05/2017  . Elevated LDL cholesterol level 07/11/2017  . [employer] (NIH) Stroke Scale limb ataxia score 2, ataxia present in two limbs 06/25/2017  . 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  Elevated vitamin B12 level 05/30/2017  . Vitamin D deficiency 05/29/2017  . History of tobacco abuse-  30pk yr hx - quit 04/07/17 05/21/2017  . Gait disturbance, post-stroke 05/14/2017  . Benign essential HTN   . Vitreous hemorrhage of right eye ([place])   . Adjustment disorder with mixed anxiety and depressed mood   . 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 66/44/0347  . 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 s/p SAH (subarachnoid hemorrhage) ([place]) 04/19/2017  . 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 Hypoxia   . Subarachnoid hemorrhage due to ruptured aneurysm ([place]) 04/07/2017  . CVA (cerebral vascular accident) ([place]) 04/07/2017  . Elevated gastrin level 01/25/2012  . Hypokalemia 01/21/2012  . Nausea & vomiting 01/20/2012  . Epigastric pain  01/20/2012  . Duodenal ulcer, acute with obstruction 10/17/2011  . S/P laparoscopic cholecystectomy 10/16/2011    [name] Barnett 10/28/2017, 5:43 PM  [facility] [address] [place], [place], [address] Phone: [phone]   Fax:  [phone]  Name: [name] Barnett MRN: [id number] Date of Birth: 1976-03-07

## 2017-10-28 NOTE — Therapy (Signed)
[facility] [address] [place], [place], [address] Phone: [phone]   Fax:  [phone]  Physical Therapy Treatment  Patient Details  Name: [name] Barnett MRN: [id number] Date of Birth: [birth year] Referring Provider: Charlett Blake, MD   Encounter Date: 10/28/2017  PT End of Session - 10/28/17 1514    Visit Number  26    Number of Visits  30    Date for PT Re-Evaluation  11/12/17    Authorization Type  BCBS    PT Start Time  1446    PT Stop Time  1530    PT Time Calculation (min)  44 min    Activity Tolerance  Patient limited by pain    Behavior During Therapy  [facility] for tasks assessed/performed       Past Medical History:  Diagnosis Date  . Duodenal obstruction   . GERD (gastroesophageal reflux disease)   . Hypertension   . Stroke [facility])    april 2018    Past Surgical History:  Procedure Laterality Date  . ABDOMINAL HYSTERECTOMY    . ANTRECTOMY N/A 01/23/2012   Performed by Haywood Lasso, MD at [place]  . BALLOON DILATION N/A 12/31/2011   Performed by Missy Sabins, MD at [facility] ENDOSCOPY  . CHOLECYSTECTOMY  07/28/11  . ESOPHAGOGASTRODUODENOSCOPY (EGD) N/A 10/17/2011   Performed by Missy Sabins, MD at [place]  . ESOPHAGOGASTRODUODENOSCOPY (EGD) WITH ESOPHAGEAL DILATION N/A 12/31/2011   Performed by Missy Sabins, MD at [facility] ENDOSCOPY  . IR ANGIO INTRA EXTRACRAN SEL INTERNAL CAROTID BILAT MOD SED  04/07/2017  . IR ANGIO VERTEBRAL SEL VERTEBRAL UNI R MOD SED  04/07/2017  . IR ANGIOGRAM FOLLOW UP STUDY  04/07/2017  . IR ANGIOGRAM FOLLOW UP STUDY  04/07/2017  . IR ANGIOGRAM FOLLOW UP STUDY  04/07/2017  . IR ANGIOGRAM FOLLOW UP STUDY  04/07/2017  . IR ANGIOGRAM FOLLOW UP STUDY  04/07/2017  . IR ANGIOGRAM SELECTIVE EACH ADDITIONAL VESSEL  04/07/2017  . IR TRANSCATH/EMBOLIZ  04/07/2017  . PARS PLANA VITRECTOMY WITH 25 GAUGE RIGHT EYE, endolaser photocoaglation Right 05/01/2017   Performed by Jalene Mullet, MD  at [place]  . RADIOLOGY WITH ANESTHESIA N/A 04/07/2017   Performed by Consuella Lose, MD at [place]  2004  . TEMPOROMANDIBULAR JOINT SURGERY     2 surgeries  . TUMOR REMOVAL    . VAGOTOMY  01/23/2012   Procedure: VAGOTOMY, antrectomy and BII;  Surgeon: Haywood Lasso, MD;  Location: [place];  Service: General;  Laterality: N/A;  Laparotomy with vagotomy.  [name] VAGOTOMY N/A 01/23/2012   Performed by Haywood Lasso, MD at [facility] OR    There were no vitals filed for this visit.  Subjective Assessment - 10/28/17 1454    Subjective  Pt states she has pain in right groin area that radiates down her inner leg, around to back of knee and down to foot. Pain began yesterday when she woke up, and felt 10/10. Today pain is 8/10.     Patient is accompained by:  Family member    Limitations  Standing;Walking    Currently in Pain?  Yes    Pain Score  8     Pain Location  Groin    Pain Orientation  Right    Pain Descriptors / Indicators  Sharp    Pain Type  Acute pain    Pain Radiating Towards  groin down  into thigh, behind knee and down to foot    Pain Frequency  Constant    Multiple Pain Sites  No        [facility] Adult PT Treatment/Exercise - 10/28/17 0001      Neuro Re-ed    Neuro Re-ed Details   Attempted NMR on blue then green physioball, but pt is feeling too much pain in RLE to perform exercise.      Knee/Hip Exercises: Stretches   Hip Flexor Stretch  Right;Other (comment) Passive stretch for 10 minutes.      Modalities   Modalities  Electrical engineer Stimulation Location  Right thigh with 4 electrodes     Electrical Stimulation Parameters  30 minutes, Preset for knee, intensity to tolerance    Electrical Stimulation Goals  Pain        PT Short Term Goals - 10/17/17 1628      PT SHORT TERM GOAL #1   Title  Pt will participate in further assessment of falls risk during gait with DGI    Baseline   10/07/17: pt not safe at this time to test this, continue to assess when appropriate    Time  4    Period  Weeks    Status  Unable to assess      PT SHORT TERM GOAL #2   Title  Pt will decrease falls risk with gait as indicated by increase in gait velocity with LRAD to > or = 2.0 ft/sec    Baseline  1.78 ft/sec with RW    Time  4    Period  Weeks    Status  Not Met      PT SHORT TERM GOAL #3   Title  Pt will decrease falls risk as indicated by increase in BERG balance score to > or = 45/56    Baseline  10/07/17: 39/56 scored today, not to goal    Time  4    Period  Weeks    Status  Not Met      PT SHORT TERM GOAL #4   Title  Pt will ambulate >200' on indoor surfaces with cane (SPC with or without quad tip) and negotiate 4 stairs with one rail and cane, alternating sequence with min A     Baseline  partially met, felt that she needed mod cues and mod A for safe gait pattern. 10/17/17     Time  4    Period  Weeks    Status  Not Met        PT Long Term Goals - 10/17/17 1634      PT LONG TERM GOAL #1   Title  Pt and husband will demonstrate independence with HEP    Time  8    Period  Weeks    Status  On-going      PT LONG TERM GOAL #2   Title  Pt will demonstrate improved balance and decreased falls risk as indicated by BERG balance score of > or = 45/56    Baseline  39/56    Time  8    Period  Weeks    Status  On-going    Target Date  11/12/17      PT LONG TERM GOAL #3   Title  Pt will ambulate with cane x 300' over paved outdoor surfaces (curbs and inclines) with supervision; will negotiate 4 stairs with one rail and cane with alternating  sequence with supervision    Time  8    Period  Weeks    Status  Revised    Target Date  11/12/17      PT LONG TERM GOAL #4   Title  Pt will report 15% improvement in Neuro QOL-LE    Baseline  31.3%    Time  8    Period  Weeks    Status  On-going    Target Date  11/12/17      PT LONG TERM GOAL #5   Title  Pt will decrease  falls risk during gait in home/community as indicated by increase in gait velocity to > or = 2.5 ft/sec with LRAD    Baseline  1.78 ft/sec with RW    Time  8    Period  Weeks    Status  Revised    Target Date  11/12/17      PT LONG TERM GOAL #6   Title  Pt will demonstrate decreased falls risk with gait as indicated by increase in DGI score by 4 points    Baseline  TBD    Time  8    Period  Weeks    Status  New        Plan - 10/28/17 1657    Clinical Impression Statement  Pt was experiencing neurological discomfort in RLE, beginning in the groin area and radiating down her inner thigh to back of knee to foot. Attempted to proceed with posture control and weight shifting exercises, but pt was experiencing significant pain. USED TENS unit on Rt thigh with intensity to tolerance, and performed soft tissue mobilization for duration of session.  By end of session pt reports pain had decreased to 5/10.     Rehab Potential  Good    PT Frequency  2x / week    PT Duration  8 weeks    PT Treatment/Interventions  ADLs/Self Care Home Management;Aquatic Therapy;DME Instruction;Gait training;Stair training;Functional mobility training;Therapeutic activities;Therapeutic exercise;Balance training;Neuromuscular re-education;Patient/family education;Vestibular;Visual/perceptual remediation/compensation;Cognitive remediation;Orthotic Fit/Training    PT Next Visit Plan  ASSESS VITALS AT [facility] VISIT. If pain is better, continue with physioball exercises for postural control. Seated oculomotor/visual training exercises in sitting with head shifts and turning and using ball for visual follow. Return to gait exercises. Assess DGI when appropriate.    Consulted and Agree with Plan of Care  Patient;Family member/caregiver    Family Member Consulted  husband       Patient will benefit from skilled therapeutic intervention in order to improve the following deficits and impairments:  Abnormal gait, Decreased balance,  Decreased cognition, Decreased coordination, Decreased strength, Difficulty walking, Dizziness, Impaired sensation, Impaired vision/preception, Pain, Decreased activity tolerance, Decreased mobility  Visit Diagnosis: Ataxic gait  Unsteadiness on feet  Dizziness and giddiness  Other lack of coordination  Other abnormalities of gait and mobility     Problem List Patient Active Problem List   Diagnosis Date Noted  . 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 Weakness with dizziness-  since [facility] and CVA 04/07/17 08/07/2017  . Disturbances of vision, late effect of stroke 08/06/2017  . Health education/counseling 08/05/2017  . High risk medications (not anticoagulants) long-term use 08/05/2017  . Neuritis-  R sided:  arm and leg/ body due to stroke 08/05/2017  . Elevated LDL cholesterol level 07/11/2017  . [employer] (NIH) Stroke Scale limb ataxia score 2, ataxia present in two limbs 06/25/2017  . 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  Elevated vitamin B12 level 05/30/2017  . Vitamin D deficiency 05/29/2017  . History of tobacco abuse-  30pk yr hx - quit 04/07/17 05/21/2017  . Gait disturbance, post-stroke 05/14/2017  . Benign essential HTN   . Vitreous hemorrhage of right eye ([place])   . Adjustment disorder with mixed anxiety and depressed mood   . 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 s/p SAH (subarachnoid hemorrhage) ([place]) 04/19/2017  . 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 Hypoxia   . Subarachnoid hemorrhage due to ruptured aneurysm ([place]) 04/07/2017  . CVA (cerebral vascular accident) ([place]) 04/07/2017  . Elevated gastrin level 01/25/2012  . Hypokalemia 01/21/2012  . Nausea & vomiting 01/20/2012  . Epigastric pain 01/20/2012  . Duodenal ulcer, acute with obstruction 10/17/2011  . S/P laparoscopic cholecystectomy 10/16/2011    Andria Meuse, SPTA 10/28/2017, 5:10 PM  [facility] [address] [place], [place], [address] Phone: [phone]   Fax:  [phone]  Name: [name] Barnett MRN: [id number] Date of Birth: 02/25/1976

## 2017-10-29 ENCOUNTER — Ambulatory Visit: Payer: BLUE CROSS/BLUE SHIELD | Admitting: Occupational Therapy

## 2017-10-29 ENCOUNTER — Ambulatory Visit: Payer: BLUE CROSS/BLUE SHIELD | Admitting: Physical Therapy

## 2017-10-29 DIAGNOSIS — I69018 Other symptoms and signs involving cognitive functions following nontraumatic subarachnoid hemorrhage: Secondary | ICD-10-CM

## 2017-10-29 DIAGNOSIS — I69054 Hemiplegia and hemiparesis following nontraumatic subarachnoid hemorrhage affecting left non-dominant side: Secondary | ICD-10-CM | POA: Diagnosis not present

## 2017-10-29 DIAGNOSIS — R41840 Attention and concentration deficit: Secondary | ICD-10-CM

## 2017-10-29 DIAGNOSIS — R2689 Other abnormalities of gait and mobility: Secondary | ICD-10-CM

## 2017-10-29 DIAGNOSIS — R2681 Unsteadiness on feet: Secondary | ICD-10-CM

## 2017-10-29 DIAGNOSIS — R41842 Visuospatial deficit: Secondary | ICD-10-CM

## 2017-10-29 DIAGNOSIS — R278 Other lack of coordination: Secondary | ICD-10-CM

## 2017-10-29 DIAGNOSIS — R260 Ataxic gait: Secondary | ICD-10-CM

## 2017-10-29 DIAGNOSIS — R42 Dizziness and giddiness: Secondary | ICD-10-CM

## 2017-10-29 NOTE — Therapy (Signed)
[facility] [address] [place], [place], [address] Phone: [phone]   Fax:  [phone]  Occupational Therapy Treatment  Patient Details  Name: [name] Barnett MRN: [id number] Date of Birth: 1976-10-19 Referring Provider: Dr. Alysia Penna   Encounter Date: 10/29/2017  OT End of Session - 10/29/17 1714    Visit Number  24    Number of Visits  30    Date for OT Re-Evaluation  11/17/18    Authorization Type  BCBS, no visit limit/auth req.    Authorization Time Period  renewal completed 09/19/17 for 8 wks    OT Start Time  1533    OT Stop Time  1615    OT Time Calculation (min)  42 min    Activity Tolerance  Patient tolerated treatment well    Behavior During Therapy  WFL for tasks assessed/performed       Past Medical History:  Diagnosis Date  . Duodenal obstruction   . GERD (gastroesophageal reflux disease)   . Hypertension   . Stroke [facility])    april 2018    Past Surgical History:  Procedure Laterality Date  . ABDOMINAL HYSTERECTOMY    . BALLOON DILATION  12/31/2011   Procedure: BALLOON DILATION;  Surgeon: Missy Sabins, MD;  Location: [facility] ENDOSCOPY;  Service: Endoscopy;  Laterality: N/A;  . CHOLECYSTECTOMY  07/28/11  . ESOPHAGOGASTRODUODENOSCOPY  10/17/2011   Procedure: ESOPHAGOGASTRODUODENOSCOPY (EGD);  Surgeon: Missy Sabins, MD;  Location: [facility] ENDOSCOPY;  Service: Endoscopy;  Laterality: N/A;  . IR ANGIO INTRA EXTRACRAN SEL INTERNAL CAROTID BILAT MOD SED  04/07/2017  . IR ANGIO VERTEBRAL SEL VERTEBRAL UNI R MOD SED  04/07/2017  . IR ANGIOGRAM FOLLOW UP STUDY  04/07/2017  . IR ANGIOGRAM FOLLOW UP STUDY  04/07/2017  . IR ANGIOGRAM FOLLOW UP STUDY  04/07/2017  . IR ANGIOGRAM FOLLOW UP STUDY  04/07/2017  . IR ANGIOGRAM FOLLOW UP STUDY  04/07/2017  . IR ANGIOGRAM SELECTIVE EACH ADDITIONAL VESSEL  04/07/2017  . IR TRANSCATH/EMBOLIZ  04/07/2017  . PARS PLANA VITRECTOMY Right 05/01/2017   Procedure: PARS PLANA  VITRECTOMY WITH 25 GAUGE RIGHT EYE, endolaser photocoaglation;  Surgeon: Jalene Mullet, MD;  Location: [facility];  Service: Ophthalmology;  Laterality: Right;  . RADIOLOGY WITH ANESTHESIA N/A 04/07/2017   Procedure: RADIOLOGY WITH ANESTHESIA;  Surgeon: Consuella Lose, MD;  Location: [facility];  Service: Radiology;  Laterality: N/A;  . REPLACEMENT TOTAL KNEE BILATERAL  2004  . TEMPOROMANDIBULAR JOINT SURGERY     2 surgeries  . TUMOR REMOVAL    . VAGOTOMY  01/23/2012   Procedure: VAGOTOMY, antrectomy and BII;  Surgeon: Haywood Lasso, MD;  Location: [facility];  Service: General;  Laterality: N/A;  Laparotomy with vagotomy.    There were no vitals filed for this visit.  Subjective Assessment - 10/29/17 1713    Pertinent History  Subarachnoid hemorrhage due to ruptured aneurysm, ataxia (L cerebellar); HTN, R side decr sensation, vertical diplopia/visual deficits, ataxia, Bilateral retinal hemorhage associated with SAH (with surgery on R eye in hospital and L approx 1 month ago)    Limitations  visual deficits, fall risk, impulsivity/cognitive deficits    Patient Stated Goals  be able to walk and use L arm    Currently in Pain?  Yes    Pain Score  4     Pain Location  Groin    Pain Orientation  Right    Pain Descriptors / Indicators  Aching  Pain Onset  More than a month ago    Pain Frequency  Intermittent    Aggravating Factors   immobility    Pain Relieving Factors  wlking                  Treatment: seated on balance disc shoulder flexion with bilateral UE's with 5 lbs ball, min A, followed by diagonals with cane with 3 lbs weight, min facilitation. Seated With wedge under right side to promote weight shift to left side functional reaching with LUE to place washers on dowels and graded clothespins, min-mod difficulty/ v.c . Arm bike x 5 mins level 4 for conditioning. Pt report increased vertigo today and therefore balance was off.             OT Short Term Goals -  10/16/17 1003      OT SHORT TERM GOAL #1   Title  Pt/caregiver will be independent with initial HEP.    Time  4    Period  Weeks    Status  Achieved 08/19/17:  with cueing from husband      OT [place] #2   Title  Pt will be use LUE as nondominant assist at least 50% of the time for ADLs without cueing.    Time  4    Period  Weeks    Status  Achieved 08/19/17:  approx 40% of the time.  09/19/17:  90%      OT SHORT TERM GOAL #3   Title  Pt will improve LUE coordination/control to improve score on box and blocks test by at least 6 with LUE.    Baseline  19 blocks    Time  4    Period  Weeks    Status  Achieved 08/22/17:  18, 20 blocks.  09/19/17:  25 blocks      OT SHORT TERM GOAL #4   Title  Pt will perform simple home maintenance task/snack prep in sitting with set-up.    Time  4    Period  Weeks    Status  Achieved 08/19/17:  met in clinic with set-up and cueing/prompts for encouragement/to slow down      OT SHORT TERM GOAL #5   Title  Pt/husband will verbalize understanding of visual compensation stategies for improved safety/incr ease with ADLs.    Time  4    Period  Weeks    Status  Achieved      OT SHORT TERM GOAL #6   Title  Pt will perform simple environmental scanning/navigation with CGA.--check STGs 10/18/17    Time  4    Period  Weeks    Status  Achieved 10/16/17:  met at approx this level      OT SHORT TERM GOAL #7   Title  Pt will complete functional task with LUE with no more than min cueing for impulsivity.      Time  4    Period  Weeks    Status  On-going 10/16/17:  min-mod cueing        OT Long Term Goals - 10/03/17 1636      OT LONG TERM GOAL #1   Title  Pt/caregiver will be independent with updated HEP.--check LTGs 12/18/16    Time  8    Period  Weeks    Status  On-going      OT LONG TERM GOAL #2   Title  Pt will be use LUE as nondominant assist at  least 75% of the time for ADLs without cueing.    Time  8    Period  Weeks    Status  Achieved  uses 75% x however needs prompting at times.  09/19/17:  met per pt  90%      OT LONG TERM GOAL #3   Title  Pt will perform toileting mod I (including transfer).    Time  8    Period  Weeks    Status  On-going supervision.  09/19/17:  continues to need superivsion for mobility      OT LONG TERM GOAL #4   Title  Pt will improve LUE coordination/control to improve score on box and blocks test by at least 12 with LUE.    Baseline  19 blocks    Time  8    Period  Weeks    Status  On-going 18, 19 blocks.  09/19/17:  25 blocks      OT LONG TERM GOAL #5   Title  Pt will perform simple home maintenance task/snack prep in standing with set-up/supervision.    Time  8    Period  Weeks    Status  On-going Pt is not perfoming consistely, she has made a sandwich in seated      OT LONG TERM GOAL #6   Title  Pt will perform simple environmental scanning/navigation with supervision.    Time  8    Period  Weeks    Status  On-going            Plan - 10/29/17 1714    Clinical Impression Statement  Pt is progressing towards goals. She demonstrates improving LUE functional use, yet still remains limited by decreased body awareness.    Rehab Potential  Good    Current Impairments/barriers affecting progress:  cognitive deficits, impulsivity    OT Frequency  2x / week    OT Duration  8 weeks    OT Treatment/Interventions  Self-care/ADL training;DME and/or AE instruction;Patient/family education;Therapeutic exercises;Balance training;Fluidtherapy;Moist Heat;Ultrasound;Therapeutic exercise;Therapeutic activities;Cryotherapy;Neuromuscular education;Functional Mobility Training;Passive range of motion;Cognitive remediation/compensation;Visual/perceptual remediation/compensation;Manual Therapy    Plan  simple home management/ cooking    OT Home Exercise Plan  Education provided:  HEP for LUE control, coordination, and functional use    Consulted and Agree with Plan of Care  Patient;Family  member/caregiver    Family Member Consulted  mother       Patient will benefit from skilled therapeutic intervention in order to improve the following deficits and impairments:  Decreased coordination, Decreased range of motion, Difficulty walking, Abnormal gait, Decreased safety awareness, Impaired sensation, Decreased knowledge of precautions, Decreased balance, Decreased knowledge of use of DME, Impaired UE functional use, Decreased cognition, Decreased mobility, Decreased strength, Impaired vision/preception, Impaired perceived functional ability  Visit Diagnosis: Other lack of coordination  Unsteadiness on feet  Other abnormalities of gait and mobility  Visuospatial deficit  Attention and concentration deficit  Other symptoms and signs involving cognitive functions following nontraumatic subarachnoid hemorrhage    Problem List Patient Active Problem List   Diagnosis Date Noted  . 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 Weakness with dizziness-  since [facility] and CVA 04/07/17 08/07/2017  . Disturbances of vision, late effect of stroke 08/06/2017  . Health education/counseling 08/05/2017  . High risk medications (not anticoagulants) long-term use 08/05/2017  . Neuritis-  R sided:  arm and leg/ body due to stroke 08/05/2017  . Elevated LDL cholesterol level 07/11/2017  . [employer] (NIH) Stroke Scale limb  ataxia score 2, ataxia present in two limbs 06/25/2017  . 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 Elevated vitamin B12 level 05/30/2017  . Vitamin D deficiency 05/29/2017  . History of tobacco abuse-  30pk yr hx - quit 04/07/17 05/21/2017  . Gait disturbance, post-stroke 05/14/2017  . Benign essential HTN   . Vitreous hemorrhage of right eye ([place])   . Adjustment disorder with mixed anxiety and depressed mood   . 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 s/p SAH (subarachnoid  hemorrhage) ([place]) 04/19/2017  . 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 Hypoxia   . Subarachnoid hemorrhage due to ruptured aneurysm ([place]) 04/07/2017  . CVA (cerebral vascular accident) ([place]) 04/07/2017  . Elevated gastrin level 01/25/2012  . Hypokalemia 01/21/2012  . Nausea & vomiting 01/20/2012  . Epigastric pain 01/20/2012  . Duodenal ulcer, acute with obstruction 10/17/2011  . S/P laparoscopic cholecystectomy 10/16/2011    [name] Barnett 10/29/2017, 5:16 PM  [facility] [address] [place], [place], [address] Phone: [phone]   Fax:  [phone]  Name: [name] Barnett MRN: [id number] Date of Birth: [birth year]

## 2017-10-30 ENCOUNTER — Ambulatory Visit: Payer: BLUE CROSS/BLUE SHIELD | Admitting: Family Medicine

## 2017-10-30 NOTE — Therapy (Signed)
[facility] [address] [place], [place], [address] Phone: [phone]   Fax:  [phone]  Physical Therapy Treatment  Patient Details  Name: [name] Barnett MRN: [id number] Date of Birth: 25-Oct-1976 Referring Provider: Charlett Blake, MD   Encounter Date: 10/29/2017  PT End of Session - 10/29/17 1457    Visit Number  27    Number of Visits  30    Date for PT Re-Evaluation  11/12/17    Authorization Type  BCBS    PT Start Time  1446    PT Stop Time  1530    PT Time Calculation (min)  44 min    Equipment Utilized During Treatment  Gait belt    Activity Tolerance  Patient limited by fatigue    Behavior During Therapy  [facility] for tasks assessed/performed       Past Medical History:  Diagnosis Date  . Duodenal obstruction   . GERD (gastroesophageal reflux disease)   . Hypertension   . Stroke [facility])    april 2018    Past Surgical History:  Procedure Laterality Date  . ABDOMINAL HYSTERECTOMY    . BALLOON DILATION  12/31/2011   Procedure: BALLOON DILATION;  Surgeon: Missy Sabins, MD;  Location: [facility] ENDOSCOPY;  Service: Endoscopy;  Laterality: N/A;  . CHOLECYSTECTOMY  07/28/11  . ESOPHAGOGASTRODUODENOSCOPY  10/17/2011   Procedure: ESOPHAGOGASTRODUODENOSCOPY (EGD);  Surgeon: Missy Sabins, MD;  Location: [facility] ENDOSCOPY;  Service: Endoscopy;  Laterality: N/A;  . IR ANGIO INTRA EXTRACRAN SEL INTERNAL CAROTID BILAT MOD SED  04/07/2017  . IR ANGIO VERTEBRAL SEL VERTEBRAL UNI R MOD SED  04/07/2017  . IR ANGIOGRAM FOLLOW UP STUDY  04/07/2017  . IR ANGIOGRAM FOLLOW UP STUDY  04/07/2017  . IR ANGIOGRAM FOLLOW UP STUDY  04/07/2017  . IR ANGIOGRAM FOLLOW UP STUDY  04/07/2017  . IR ANGIOGRAM FOLLOW UP STUDY  04/07/2017  . IR ANGIOGRAM SELECTIVE EACH ADDITIONAL VESSEL  04/07/2017  . IR TRANSCATH/EMBOLIZ  04/07/2017  . PARS PLANA VITRECTOMY Right 05/01/2017   Procedure: PARS PLANA VITRECTOMY WITH 25 GAUGE RIGHT EYE, endolaser  photocoaglation;  Surgeon: Jalene Mullet, MD;  Location: [facility];  Service: Ophthalmology;  Laterality: Right;  . RADIOLOGY WITH ANESTHESIA N/A 04/07/2017   Procedure: RADIOLOGY WITH ANESTHESIA;  Surgeon: Consuella Lose, MD;  Location: [facility];  Service: Radiology;  Laterality: N/A;  . REPLACEMENT TOTAL KNEE BILATERAL  2004  . TEMPOROMANDIBULAR JOINT SURGERY     2 surgeries  . TUMOR REMOVAL    . VAGOTOMY  01/23/2012   Procedure: VAGOTOMY, antrectomy and BII;  Surgeon: Haywood Lasso, MD;  Location: [facility];  Service: General;  Laterality: N/A;  Laparotomy with vagotomy.    There were no vitals filed for this visit.  Subjective Assessment - 10/29/17 1455    Subjective  Pt came to session feeling very dizzy. States dizziness began when she woke up this morning. Pain in groin area is a 4/10.     Patient is accompained by:  Family member    Limitations  Standing;Walking    Currently in Pain?  Yes    Pain Score  4     Pain Location  Groin    Pain Orientation  Right    Pain Descriptors / Indicators  Aching    Pain Type  Acute pain        Vestibular Treatment/Exercise - 10/30/17 0001      Vestibular Treatment/Exercise   Vestibular Treatment Provided  Gaze    Habituation Exercises  Seated Horizontal Head Turns;Seated Diagonal Head Turns;Comment    Gaze Exercises  X1 Viewing Horizontal;X1 Viewing Vertical      Seated Horizontal Head Turns   Number of Reps   10 each way    Symptom Description   with arms extended out each hand holding a letter card: pt tracked card to laterally  in horizontal abdcution and back to center, alternating sidesl. mild symtoms with first couple reps, progressing to none with remaining reps       Seated Diagonal Head Turns   Number of Reps  10 each side    Symptoms Description   holding ball in both hands in center of lap: had pt track ball up at diagonal and back down, alternating sides. pt with increased symptoms going to right vs left side, decreased in  intensity as reps progressed.                                             360 degree Turns   COMMENT  seated at edge of mat with 3 cones on floor: had pt track hand with eyes as she reached down to retrieve cone and then place cone on mat on contralateral side x 3 cones. increased symptoms with going down toward right vs left. decreased in intenity as she rested.                      X1 Viewing Horizontal   Foot Position  seated on mat table with feet on floor, then seated on air disk to facilitate weight shift    Reps  6 3 on mat, 3 on air disc    Comments  20 seconds progressing to 30 seconds      X1 Viewing Vertical   Foot Position  seated on mat table with feet on floor, then seated on air disk to facilitate weight shift    Reps  6 3 on mat, 3 on air disc    Comments  20 seconds      Eye/Head Exercise Horizontal   Foot Position  seated on mat table with feet on floor, then seated on air disk to facilitate weight shift    Reps  10    Comments  3 feet away; middle right then middle left      Eye/Head Exercise Vertical   Foot Position  seated on mat table with feet on floor, then seated on air disk to facilitate weight shift    Reps  10    Comments  3 feet away; middle up then middle down          PT Short Term Goals - 10/17/17 1628      PT SHORT TERM GOAL #1   Title  Pt will participate in further assessment of falls risk during gait with DGI    Baseline  10/07/17: pt not safe at this time to test this, continue to assess when appropriate    Time  4    Period  Weeks    Status  Unable to assess      PT SHORT TERM GOAL #2   Title  Pt will decrease falls risk with gait as indicated by increase in gait velocity with LRAD to > or = 2.0 ft/sec    Baseline  1.78 ft/sec with RW  Time  4    Period  Weeks    Status  Not Met      PT SHORT TERM GOAL #3   Title  Pt will decrease falls risk as indicated by increase in BERG balance score to > or = 45/56    Baseline  10/07/17:  39/56 scored today, not to goal    Time  4    Period  Weeks    Status  Not Met      PT SHORT TERM GOAL #4   Title  Pt will ambulate >200' on indoor surfaces with cane (SPC with or without quad tip) and negotiate 4 stairs with one rail and cane, alternating sequence with min A     Baseline  partially met, felt that she needed mod cues and mod A for safe gait pattern. 10/17/17     Time  4    Period  Weeks    Status  Not Met        PT Long Term Goals - 10/17/17 1634      PT LONG TERM GOAL #1   Title  Pt and husband will demonstrate independence with HEP    Time  8    Period  Weeks    Status  On-going      PT LONG TERM GOAL #2   Title  Pt will demonstrate improved balance and decreased falls risk as indicated by BERG balance score of > or = 45/56    Baseline  39/56    Time  8    Period  Weeks    Status  On-going    Target Date  11/12/17      PT LONG TERM GOAL #3   Title  Pt will ambulate with cane x 300' over paved outdoor surfaces (curbs and inclines) with supervision; will negotiate 4 stairs with one rail and cane with alternating sequence with supervision    Time  8    Period  Weeks    Status  Revised    Target Date  11/12/17      PT LONG TERM GOAL #4   Title  Pt will report 15% improvement in Neuro QOL-LE    Baseline  31.3%    Time  8    Period  Weeks    Status  On-going    Target Date  11/12/17      PT LONG TERM GOAL #5   Title  Pt will decrease falls risk during gait in home/community as indicated by increase in gait velocity to > or = 2.5 ft/sec with LRAD    Baseline  1.78 ft/sec with RW    Time  8    Period  Weeks    Status  Revised    Target Date  11/12/17      PT LONG TERM GOAL #6   Title  Pt will demonstrate decreased falls risk with gait as indicated by increase in DGI score by 4 points    Baseline  TBD    Time  8    Period  Weeks    Status  New      Plan - 10/30/17 0850    Clinical Impression Statement  Pt arrived at clinic feeling dizzy and did  not feel comfortable performing balance or gait training activities today. Today's session focused on vestibular exercises with posture control, and pt stated that her dizziness decreased during the session. Pt tolerated treatment well, and will continue to benefit from further skilled  PT sessions.     Rehab Potential  Good    PT Frequency  2x / week    PT Duration  8 weeks    PT Treatment/Interventions  ADLs/Self Care Home Management;Aquatic Therapy;DME Instruction;Gait training;Stair training;Functional mobility training;Therapeutic activities;Therapeutic exercise;Balance training;Neuromuscular re-education;Patient/family education;Vestibular;Visual/perceptual remediation/compensation;Cognitive remediation;Orthotic Fit/Training    PT Next Visit Plan  ASSESS VITALS AT [facility] VISIT. Continue with physioball exercises for postural control. Seated oculomotor/visual training exercises in sitting with head shifts and turning and using ball for visual follow. Return to gait exercises. Assess DGI when appropriate.    Consulted and Agree with Plan of Care  Patient;Family member/caregiver    Family Member Consulted  mother        Patient will benefit from skilled therapeutic intervention in order to improve the following deficits and impairments:  Abnormal gait, Decreased balance, Decreased cognition, Decreased coordination, Decreased strength, Difficulty walking, Dizziness, Impaired sensation, Impaired vision/preception, Pain, Decreased activity tolerance, Decreased mobility  Visit Diagnosis: Unsteadiness on feet  Ataxic gait  Dizziness and giddiness  Other lack of coordination  Other abnormalities of gait and mobility     Problem List Patient Active Problem List   Diagnosis Date Noted  . 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 Weakness with dizziness-  since [facility] and CVA 04/07/17 08/07/2017  . Disturbances of vision, late effect of stroke 08/06/2017  . Health education/counseling 08/05/2017  .  High risk medications (not anticoagulants) long-term use 08/05/2017  . Neuritis-  R sided:  arm and leg/ body due to stroke 08/05/2017  . Elevated LDL cholesterol level 07/11/2017  . [employer] (NIH) Stroke Scale limb ataxia score 2, ataxia present in two limbs 06/25/2017  . 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 Elevated vitamin B12 level 05/30/2017  . Vitamin D deficiency 05/29/2017  . History of tobacco abuse-  30pk yr hx - quit 04/07/17 05/21/2017  . Gait disturbance, post-stroke 05/14/2017  . Benign essential HTN   . Vitreous hemorrhage of right eye ([place])   . Adjustment disorder with mixed anxiety and depressed mood   . 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 s/p SAH (subarachnoid hemorrhage) ([place]) 04/19/2017  . 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 Hypoxia   . Subarachnoid hemorrhage due to ruptured aneurysm ([place]) 04/07/2017  . CVA (cerebral vascular accident) ([place]) 04/07/2017  . Elevated gastrin level 01/25/2012  . Hypokalemia 01/21/2012  . Nausea & vomiting 01/20/2012  . Epigastric pain 01/20/2012  . Duodenal ulcer, acute with obstruction 10/17/2011  . S/P laparoscopic cholecystectomy 10/16/2011    Andria Meuse, SPTA 10/30/2017, 8:57 AM  [facility] [address] [place], [place], [address] Phone: [phone]   Fax:  [phone]  Name: TERILYN SANO MRN: [id number] Date of Birth: August 02, 1976  This note has been reviewed and edited by supervising CI. Added in compensatory strategy comments as CI directed these activities along side student.   Willow Ora, PTA, [facility] [address] [place], [place] [address] [phone] 10/30/17, 10:49 AM

## 2017-11-04 ENCOUNTER — Encounter: Payer: Self-pay | Admitting: Physical Therapy

## 2017-11-04 ENCOUNTER — Ambulatory Visit: Payer: BLUE CROSS/BLUE SHIELD | Admitting: Occupational Therapy

## 2017-11-04 ENCOUNTER — Encounter: Payer: Self-pay | Admitting: Occupational Therapy

## 2017-11-04 ENCOUNTER — Ambulatory Visit: Payer: BLUE CROSS/BLUE SHIELD | Admitting: Physical Therapy

## 2017-11-04 DIAGNOSIS — R41840 Attention and concentration deficit: Secondary | ICD-10-CM

## 2017-11-04 DIAGNOSIS — I69054 Hemiplegia and hemiparesis following nontraumatic subarachnoid hemorrhage affecting left non-dominant side: Secondary | ICD-10-CM

## 2017-11-04 DIAGNOSIS — R208 Other disturbances of skin sensation: Secondary | ICD-10-CM

## 2017-11-04 DIAGNOSIS — R2681 Unsteadiness on feet: Secondary | ICD-10-CM

## 2017-11-04 DIAGNOSIS — R260 Ataxic gait: Secondary | ICD-10-CM

## 2017-11-04 DIAGNOSIS — R278 Other lack of coordination: Secondary | ICD-10-CM

## 2017-11-04 DIAGNOSIS — R2689 Other abnormalities of gait and mobility: Secondary | ICD-10-CM

## 2017-11-04 DIAGNOSIS — R41842 Visuospatial deficit: Secondary | ICD-10-CM

## 2017-11-04 DIAGNOSIS — I69018 Other symptoms and signs involving cognitive functions following nontraumatic subarachnoid hemorrhage: Secondary | ICD-10-CM

## 2017-11-04 DIAGNOSIS — R270 Ataxia, unspecified: Secondary | ICD-10-CM

## 2017-11-04 DIAGNOSIS — R414 Neurologic neglect syndrome: Secondary | ICD-10-CM

## 2017-11-04 NOTE — Therapy (Signed)
[facility] [address] [place], [place], [address] Phone: [phone]   Fax:  [phone]  Occupational Therapy Treatment  Patient Details  Name: [name] Barnett MRN: [id number] Date of Birth: 07/20/1976 Referring Provider: Dr. Alysia Penna   Encounter Date: 11/04/2017  OT End of Session - 11/04/17 1732    Visit Number  25    Number of Visits  30    Date for OT Re-Evaluation  11/17/18    Authorization Type  BCBS, no visit limit/auth req.    Authorization Time Period  renewal completed 09/19/17 for 8 wks    OT Start Time  1536    OT Stop Time  1618    OT Time Calculation (min)  42 min    Activity Tolerance  Patient tolerated treatment well    Behavior During Therapy  WFL for tasks assessed/performed       Past Medical History:  Diagnosis Date  . Duodenal obstruction   . GERD (gastroesophageal reflux disease)   . Hypertension   . Stroke [facility])    april 2018    Past Surgical History:  Procedure Laterality Date  . ABDOMINAL HYSTERECTOMY    . BALLOON DILATION  12/31/2011   Procedure: BALLOON DILATION;  Surgeon: Missy Sabins, MD;  Location: [facility] ENDOSCOPY;  Service: Endoscopy;  Laterality: N/A;  . CHOLECYSTECTOMY  07/28/11  . ESOPHAGOGASTRODUODENOSCOPY  10/17/2011   Procedure: ESOPHAGOGASTRODUODENOSCOPY (EGD);  Surgeon: Missy Sabins, MD;  Location: [facility] ENDOSCOPY;  Service: Endoscopy;  Laterality: N/A;  . IR ANGIO INTRA EXTRACRAN SEL INTERNAL CAROTID BILAT MOD SED  04/07/2017  . IR ANGIO VERTEBRAL SEL VERTEBRAL UNI R MOD SED  04/07/2017  . IR ANGIOGRAM FOLLOW UP STUDY  04/07/2017  . IR ANGIOGRAM FOLLOW UP STUDY  04/07/2017  . IR ANGIOGRAM FOLLOW UP STUDY  04/07/2017  . IR ANGIOGRAM FOLLOW UP STUDY  04/07/2017  . IR ANGIOGRAM FOLLOW UP STUDY  04/07/2017  . IR ANGIOGRAM SELECTIVE EACH ADDITIONAL VESSEL  04/07/2017  . IR TRANSCATH/EMBOLIZ  04/07/2017  . PARS PLANA VITRECTOMY Right 05/01/2017   Procedure: PARS PLANA  VITRECTOMY WITH 25 GAUGE RIGHT EYE, endolaser photocoaglation;  Surgeon: Jalene Mullet, MD;  Location: [facility];  Service: Ophthalmology;  Laterality: Right;  . RADIOLOGY WITH ANESTHESIA N/A 04/07/2017   Procedure: RADIOLOGY WITH ANESTHESIA;  Surgeon: Consuella Lose, MD;  Location: [facility];  Service: Radiology;  Laterality: N/A;  . REPLACEMENT TOTAL KNEE BILATERAL  2004  . TEMPOROMANDIBULAR JOINT SURGERY     2 surgeries  . TUMOR REMOVAL    . VAGOTOMY  01/23/2012   Procedure: VAGOTOMY, antrectomy and BII;  Surgeon: Haywood Lasso, MD;  Location: [facility];  Service: General;  Laterality: N/A;  Laparotomy with vagotomy.    There were no vitals filed for this visit.  Subjective Assessment - 11/04/17 1612    Subjective   Made deviled eggs and mac and cheesse with help    Pertinent History  Subarachnoid hemorrhage due to ruptured aneurysm, ataxia (L cerebellar); HTN, R side decr sensation, vertical diplopia/visual deficits, ataxia, Bilateral retinal hemorhage associated with SAH (with surgery on R eye in hospital and L approx 1 month ago)    Limitations  visual deficits, fall risk, impulsivity/cognitive deficits    Patient Stated Goals  be able to walk and use L arm    Currently in Pain?  Yes    Pain Score  10-Worst pain ever    Pain Location  Groin  Pain Orientation  Right    Pain Descriptors / Indicators  Shooting    Pain Type  Neuropathic pain    Pain Onset  More than a month ago    Pain Frequency  Intermittent    Aggravating Factors   immobility    Pain Relieving Factors  walking       Simple cooking task (making grilled cheese) with CGA and  cueing for safety (avoid reaching too far/step closer prior to reaching, RW safety/using countertop support, use of LUE, pre-plan/get all needed items out prior to cooking.  Also made recommendations for cooking and reviewed with pt/husband.  Arm bike x47mn level 3 for reciprocal movement with min cueing for slow/controlled movement (<35rpms) and  shoulder/elbow positioning to decr IR.    Discussed progress.  Pt demo improving awareness.                    OT Education - 11/04/17 1730    Education Details  Recommendations for cooking:  only 1 burner on at a time, gather all needed items/ingredients prior to putting something on stove (pre-plan), cooking only with supervision, use countertop for support when able, don't pick up items from floor without someone close and countertop support, don't reach so far (step closer).    Person(s) Educated  Patient;Spouse    Methods  Explanation;Demonstration    Comprehension  Verbalized understanding       OT Short Term Goals - 10/16/17 1003      OT SHORT TERM GOAL #1   Title  Pt/caregiver will be independent with initial HEP.    Time  4    Period  Weeks    Status  Achieved 08/19/17:  with cueing from husband      OT [place]#2   Title  Pt will be use LUE as nondominant assist at least 50% of the time for ADLs without cueing.    Time  4    Period  Weeks    Status  Achieved 08/19/17:  approx 40% of the time.  09/19/17:  90%      OT SHORT TERM GOAL #3   Title  Pt will improve LUE coordination/control to improve score on box and blocks test by at least 6 with LUE.    Baseline  19 blocks    Time  4    Period  Weeks    Status  Achieved 08/22/17:  18, 20 blocks.  09/19/17:  25 blocks      OT SHORT TERM GOAL #4   Title  Pt will perform simple home maintenance task/snack prep in sitting with set-up.    Time  4    Period  Weeks    Status  Achieved 08/19/17:  met in clinic with set-up and cueing/prompts for encouragement/to slow down      OT SHORT TERM GOAL #5   Title  Pt/husband will verbalize understanding of visual compensation stategies for improved safety/incr ease with ADLs.    Time  4    Period  Weeks    Status  Achieved      OT SHORT TERM GOAL #6   Title  Pt will perform simple environmental scanning/navigation with CGA.--check STGs 10/18/17    Time  4     Period  Weeks    Status  Achieved 10/16/17:  met at approx this level      OT SHORT TERM GOAL #7   Title  Pt will complete functional task with  LUE with no more than min cueing for impulsivity.      Time  4    Period  Weeks    Status  On-going 10/16/17:  min-mod cueing        OT Long Term Goals - 10/03/17 1636      OT LONG TERM GOAL #1   Title  Pt/caregiver will be independent with updated HEP.--check LTGs 12/18/16    Time  8    Period  Weeks    Status  On-going      OT LONG TERM GOAL #2   Title  Pt will be use LUE as nondominant assist at least 75% of the time for ADLs without cueing.    Time  8    Period  Weeks    Status  Achieved uses 75% x however needs prompting at times.  09/19/17:  met per pt  90%      OT LONG TERM GOAL #3   Title  Pt will perform toileting mod I (including transfer).    Time  8    Period  Weeks    Status  On-going supervision.  09/19/17:  continues to need superivsion for mobility      OT LONG TERM GOAL #4   Title  Pt will improve LUE coordination/control to improve score on box and blocks test by at least 12 with LUE.    Baseline  19 blocks    Time  8    Period  Weeks    Status  On-going 18, 19 blocks.  09/19/17:  25 blocks      OT LONG TERM GOAL #5   Title  Pt will perform simple home maintenance task/snack prep in standing with set-up/supervision.    Time  8    Period  Weeks    Status  On-going Pt is not perfoming consistely, she has made a sandwich in seated      OT LONG TERM GOAL #6   Title  Pt will perform simple environmental scanning/navigation with supervision.    Time  8    Period  Weeks    Status  On-going            Plan - 11/04/17 1733    Clinical Impression Statement  Pt is progressing towards goals with improving LUE functional use, decr impulsivity, and improving balance.  Pt was able to perform simple cooking task with CGA and min cueing.      Rehab Potential  Good    Current Impairments/barriers affecting progress:   cognitive deficits, impulsivity    OT Frequency  2x / week    OT Duration  8 weeks    OT Treatment/Interventions  Self-care/ADL training;DME and/or AE instruction;Patient/family education;Therapeutic exercises;Balance training;Fluidtherapy;Moist Heat;Ultrasound;Therapeutic exercise;Therapeutic activities;Cryotherapy;Neuromuscular education;Functional Mobility Training;Passive range of motion;Cognitive remediation/compensation;Visual/perceptual remediation/compensation;Manual Therapy    Plan  simple home management, environmental scanning; discuss renewal vs. d/c after next week    OT Home Exercise Plan  Education provided:  HEP for LUE control, coordination, and functional use    Consulted and Agree with Plan of Care  Patient;Family member/caregiver    Family Member Consulted  mother       Patient will benefit from skilled therapeutic intervention in order to improve the following deficits and impairments:  Decreased coordination, Decreased range of motion, Difficulty walking, Abnormal gait, Decreased safety awareness, Impaired sensation, Decreased knowledge of precautions, Decreased balance, Decreased knowledge of use of DME, Impaired UE functional use, Decreased cognition, Decreased mobility, Decreased strength, Impaired vision/preception,  Impaired perceived functional ability  Visit Diagnosis: 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 Weakness with dizziness-  since [facility] and CVA  04/07/17 08/07/2017  . Disturbances of vision, late effect of stroke 08/06/2017  . Health education/counseling 08/05/2017  . High risk medications (not anticoagulants) long-term use 08/05/2017  . Neuritis-  R sided:  arm and leg/ body due to stroke 08/05/2017  . Elevated LDL cholesterol level 07/11/2017  . [employer] (NIH) Stroke Scale limb ataxia score 2, ataxia present in two limbs 06/25/2017  . 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 Elevated vitamin B12 level 05/30/2017  . Vitamin D deficiency 05/29/2017  . History of tobacco abuse-  30pk yr hx - quit 04/07/17 05/21/2017  . Gait disturbance, post-stroke 05/14/2017  . Benign essential HTN   . Vitreous hemorrhage of right eye ([place])   . Adjustment disorder with mixed anxiety and depressed mood   . 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 s/p SAH (subarachnoid hemorrhage) ([place]) 04/19/2017  . 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 Hypoxia   . Subarachnoid hemorrhage due to ruptured aneurysm ([place]) 04/07/2017  . CVA (cerebral vascular accident) ([place]) 04/07/2017  . Elevated gastrin level 01/25/2012  . Hypokalemia 01/21/2012  . Nausea & vomiting 01/20/2012  . Epigastric pain 01/20/2012  . Duodenal ulcer, acute with obstruction 10/17/2011  . S/P laparoscopic cholecystectomy 10/16/2011    [facility] 11/04/2017, 5:35 PM  [facility] [address] [place], [place], [address] Phone: [phone]   Fax:  [phone]  Name: JAKAYLAH SCHLAFER MRN: [id number] Date of Birth: Jan 26, 1976   Vianne Bulls, OTR/L [facility] [address] [place],   [address] [phone] phone [phone] 11/04/17 5:36 PM

## 2017-11-04 NOTE — Therapy (Signed)
[facility] [address] [place], [place], [address] Phone: [phone]   Fax:  [phone]  Physical Therapy Treatment  Patient Details  Name: [name] Barnett MRN: [id number] Date of Birth: [birth year] Referring Provider: Charlett Blake, MD   Encounter Date: 11/04/2017  PT End of Session - 11/04/17 1656    Visit Number  28    Number of Visits  30    Date for PT Re-Evaluation  11/12/17    Authorization Type  BCBS    PT Start Time  1452    PT Stop Time  1535    PT Time Calculation (min)  43 min    Activity Tolerance  Patient limited by pain    Behavior During Therapy  [facility] for tasks assessed/performed       Past Medical History:  Diagnosis Date  . Duodenal obstruction   . GERD (gastroesophageal reflux disease)   . Hypertension   . Stroke [facility])    april 2018    Past Surgical History:  Procedure Laterality Date  . ABDOMINAL HYSTERECTOMY    . BALLOON DILATION  12/31/2011   Procedure: BALLOON DILATION;  Surgeon: Missy Sabins, MD;  Location: [facility] ENDOSCOPY;  Service: Endoscopy;  Laterality: N/A;  . CHOLECYSTECTOMY  07/28/11  . ESOPHAGOGASTRODUODENOSCOPY  10/17/2011   Procedure: ESOPHAGOGASTRODUODENOSCOPY (EGD);  Surgeon: Missy Sabins, MD;  Location: [facility] ENDOSCOPY;  Service: Endoscopy;  Laterality: N/A;  . IR ANGIO INTRA EXTRACRAN SEL INTERNAL CAROTID BILAT MOD SED  04/07/2017  . IR ANGIO VERTEBRAL SEL VERTEBRAL UNI R MOD SED  04/07/2017  . IR ANGIOGRAM FOLLOW UP STUDY  04/07/2017  . IR ANGIOGRAM FOLLOW UP STUDY  04/07/2017  . IR ANGIOGRAM FOLLOW UP STUDY  04/07/2017  . IR ANGIOGRAM FOLLOW UP STUDY  04/07/2017  . IR ANGIOGRAM FOLLOW UP STUDY  04/07/2017  . IR ANGIOGRAM SELECTIVE EACH ADDITIONAL VESSEL  04/07/2017  . IR TRANSCATH/EMBOLIZ  04/07/2017  . PARS PLANA VITRECTOMY Right 05/01/2017   Procedure: PARS PLANA VITRECTOMY WITH 25 GAUGE RIGHT EYE, endolaser photocoaglation;  Surgeon: Jalene Mullet, MD;  Location: [place];   Service: Ophthalmology;  Laterality: Right;  . RADIOLOGY WITH ANESTHESIA N/A 04/07/2017   Procedure: RADIOLOGY WITH ANESTHESIA;  Surgeon: Consuella Lose, MD;  Location: [place];  Service: Radiology;  Laterality: N/A;  . REPLACEMENT TOTAL KNEE BILATERAL  2004  . TEMPOROMANDIBULAR JOINT SURGERY     2 surgeries  . TUMOR REMOVAL    . VAGOTOMY  01/23/2012   Procedure: VAGOTOMY, antrectomy and BII;  Surgeon: Haywood Lasso, MD;  Location: [place];  Service: General;  Laterality: N/A;  Laparotomy with vagotomy.    There were no vitals filed for this visit.  Subjective Assessment - 11/04/17 1455    Subjective  Dizziness has improved today; groin pain continues to be severe: 10/10 today RLE.  From R lateral hip into anterior thigh.    Patient is accompained by:  Family member    Limitations  Standing;Walking    Currently in Pain?  Yes    Pain Score  10-Worst pain ever    Pain Location  Groin    Pain Orientation  Right    Pain Descriptors / Indicators  Shooting    Pain Type  Neuropathic pain                      [facility] Adult PT Treatment/Exercise - 11/04/17 1516      Ambulation/Gait   Ambulation/Gait  Yes    Ambulation/Gait Assistance  4: Min assist;3: Mod assist    Ambulation/Gait Assistance Details  with therapist on L side placing LUE in ER for more upright trunk and to decrease pt pushing through LUE and verbal/tactile cues provided for full L lateral weight shifting, increased stance time LLE and increased heel strike at initial stance    Ambulation Distance (Feet)  115 Feet    Assistive device  None    Gait Pattern  Step-through pattern;Decreased step length - right;Decreased step length - left;Decreased stride length;Decreased hip/knee flexion - right;Decreased hip/knee flexion - left;Decreased weight shift to left;Ataxic;Decreased trunk rotation    Ambulation Surface  Level;Indoor      Neuro Re-ed    Neuro Re-ed Details   NMR seated on green therapy ball performing  pelvic lateral weight shifts to L<>middle<>R maintaining head and trunk in midline, vestibular stimulation with bouncing with EO head still, EC head still x 30 seconds, and EO with head turns/nods, combined weight shifting with alternating LAQ and marching with improve tolerance in RLE and improved ability to maintain COG in midline during reciprocal LE movements without visual feedback today      Exercises   Other Exercises   Supine RLE quad contract-relax with LE in extension with 5 sec hold x 5 reps; prone with glute contract-relax with 5 second hold x 5 reps; prone press up to elbow with 5 sec hold x 5 reps to facilitate hip flexor stretch, and desensitization of anterior LE             PT Education - 11/04/17 1656    Education provided  Yes    Education Details  exercises to desensitize and improve ROM R anterior hip    Person(s) Educated  Patient;Spouse    Methods  Explanation;Demonstration;Handout    Comprehension  Verbalized understanding;Returned demonstration       PT Short Term Goals - 10/17/17 1628      PT SHORT TERM GOAL #1   Title  Pt will participate in further assessment of falls risk during gait with DGI    Baseline  10/07/17: pt not safe at this time to test this, continue to assess when appropriate    Time  4    Period  Weeks    Status  Unable to assess      PT SHORT TERM GOAL #2   Title  Pt will decrease falls risk with gait as indicated by increase in gait velocity with LRAD to > or = 2.0 ft/sec    Baseline  1.78 ft/sec with RW    Time  4    Period  Weeks    Status  Not Met      PT SHORT TERM GOAL #3   Title  Pt will decrease falls risk as indicated by increase in BERG balance score to > or = 45/56    Baseline  10/07/17: 39/56 scored today, not to goal    Time  4    Period  Weeks    Status  Not Met      PT SHORT TERM GOAL #4   Title  Pt will ambulate >200' on indoor surfaces with cane (SPC with or without quad tip) and negotiate 4 stairs with one  rail and cane, alternating sequence with min A     Baseline  partially met, felt that she needed mod cues and mod A for safe gait pattern. 10/17/17     Time  4  Period  Weeks    Status  Not Met        PT Long Term Goals - 10/17/17 1634      PT LONG TERM GOAL #1   Title  Pt and husband will demonstrate independence with HEP    Time  8    Period  Weeks    Status  On-going      PT LONG TERM GOAL #2   Title  Pt will demonstrate improved balance and decreased falls risk as indicated by BERG balance score of > or = 45/56    Baseline  39/56    Time  8    Period  Weeks    Status  On-going    Target Date  11/12/17      PT LONG TERM GOAL #3   Title  Pt will ambulate with cane x 300' over paved outdoor surfaces (curbs and inclines) with supervision; will negotiate 4 stairs with one rail and cane with alternating sequence with supervision    Time  8    Period  Weeks    Status  Revised    Target Date  11/12/17      PT LONG TERM GOAL #4   Title  Pt will report 15% improvement in Neuro QOL-LE    Baseline  31.3%    Time  8    Period  Weeks    Status  On-going    Target Date  11/12/17      PT LONG TERM GOAL #5   Title  Pt will decrease falls risk during gait in home/community as indicated by increase in gait velocity to > or = 2.5 ft/sec with LRAD    Baseline  1.78 ft/sec with RW    Time  8    Period  Weeks    Status  Revised    Target Date  11/12/17      PT LONG TERM GOAL #6   Title  Pt will demonstrate decreased falls risk with gait as indicated by increase in DGI score by 4 points    Baseline  TBD    Time  8    Period  Weeks    Status  New            Plan - 11/04/17 1656    Clinical Impression Statement  Pt reporting less dizziness today but R groin pain has returned to 10/10; first part of session focused on exercises to promote increased hip extension ROM (due to pt holding LE in flexion or position of comfort) and to desensitize RLE and improve tolerance for  various movements.  Pt tolerated well with no increase in pain and better tolerance of seated therapy ball exercises.  Continued weight shifting and NMR on physioball with improved ability to maintain COG in midline; demonstrated improved carryover of weight shifting to gait overground without AD with therapist facilitating trunk control and gait sequence on L side.  Will continue to assess and progress towards LTG.    Rehab Potential  Good    PT Frequency  2x / week    PT Duration  8 weeks    PT Treatment/Interventions  ADLs/Self Care Home Management;Aquatic Therapy;DME Instruction;Gait training;Stair training;Functional mobility training;Therapeutic activities;Therapeutic exercise;Balance training;Neuromuscular re-education;Patient/family education;Vestibular;Visual/perceptual remediation/compensation;Cognitive remediation;Orthotic Fit/Training    PT Next Visit Plan  ASSESS VITALS AT [facility] VISIT. Discuss recert next week: 4 more weeks or longer?  Pt preference?  Continue with physioball exercises for postural control. Seated oculomotor/visual training exercises in sitting  with head shifts and turning and using ball for visual follow. Return to gait exercises. Assess DGI when appropriate.    Consulted and Agree with Plan of Care  Patient;Family member/caregiver    Family Member Consulted  husband       Patient will benefit from skilled therapeutic intervention in order to improve the following deficits and impairments:  Abnormal gait, Decreased balance, Decreased cognition, Decreased coordination, Decreased strength, Difficulty walking, Dizziness, Impaired sensation, Impaired vision/preception, Pain, Decreased activity tolerance, Decreased mobility  Visit Diagnosis: Ataxic gait  Other lack of coordination  Other abnormalities of gait and mobility  Unsteadiness on feet     Problem List Patient Active Problem List   Diagnosis Date Noted  . 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 Weakness with  dizziness-  since [facility] and CVA 04/07/17 08/07/2017  . Disturbances of vision, late effect of stroke 08/06/2017  . Health education/counseling 08/05/2017  . High risk medications (not anticoagulants) long-term use 08/05/2017  . Neuritis-  R sided:  arm and leg/ body due to stroke 08/05/2017  . Elevated LDL cholesterol level 07/11/2017  . [employer] (NIH) Stroke Scale limb ataxia score 2, ataxia present in two limbs 06/25/2017  . 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 Elevated vitamin B12 level 05/30/2017  . Vitamin D deficiency 05/29/2017  . History of tobacco abuse-  30pk yr hx - quit 04/07/17 05/21/2017  . Gait disturbance, post-stroke 05/14/2017  . Benign essential HTN   . Vitreous hemorrhage of right eye ([place])   . Adjustment disorder with mixed anxiety and depressed mood   . 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 s/p SAH (subarachnoid hemorrhage) ([place]) 04/19/2017  . 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 Hypoxia   . Subarachnoid hemorrhage due to ruptured aneurysm ([place]) 04/07/2017  . CVA (cerebral vascular accident) ([place]) 04/07/2017  . Elevated gastrin level 01/25/2012  . Hypokalemia 01/21/2012  . Nausea & vomiting 01/20/2012  . Epigastric pain 01/20/2012  . Duodenal ulcer, acute with obstruction 10/17/2011  . S/P laparoscopic cholecystectomy 10/16/2011    Rico Junker, PT, DPT 11/04/17    5:03 PM    [facility] [address] [place], [place], [address] Phone: [phone]   Fax:  [phone]  Name: MAFALDA MCGINNISS MRN: [id number] Date of Birth: 1976/05/03

## 2017-11-04 NOTE — Patient Instructions (Signed)
LYING ON THE FLOOR ON YOUR BACK: 1) Straighten out your right leg, contract the front of the thigh of the right leg for 5 seconds, relax fully.  Repeat 5-10 times  ROLL ONTO YOUR STOMACH: 1) Contract the butt muscles on the right leg lifting the leg off the floor, hold 5 seconds, lower and relax fully.  Repeat 5-10 times  2) Press up to elbows and hold 5 seconds, relax back down.  Repeat 5-10 times

## 2017-11-05 ENCOUNTER — Encounter: Payer: Self-pay | Admitting: Occupational Therapy

## 2017-11-05 ENCOUNTER — Ambulatory Visit: Payer: BLUE CROSS/BLUE SHIELD | Admitting: Occupational Therapy

## 2017-11-05 ENCOUNTER — Ambulatory Visit: Payer: BLUE CROSS/BLUE SHIELD | Admitting: Physical Therapy

## 2017-11-05 ENCOUNTER — Encounter: Payer: Self-pay | Admitting: Physical Therapy

## 2017-11-05 DIAGNOSIS — R2681 Unsteadiness on feet: Secondary | ICD-10-CM

## 2017-11-05 DIAGNOSIS — R41840 Attention and concentration deficit: Secondary | ICD-10-CM

## 2017-11-05 DIAGNOSIS — I69054 Hemiplegia and hemiparesis following nontraumatic subarachnoid hemorrhage affecting left non-dominant side: Secondary | ICD-10-CM

## 2017-11-05 DIAGNOSIS — R2689 Other abnormalities of gait and mobility: Secondary | ICD-10-CM

## 2017-11-05 DIAGNOSIS — R42 Dizziness and giddiness: Secondary | ICD-10-CM

## 2017-11-05 DIAGNOSIS — R208 Other disturbances of skin sensation: Secondary | ICD-10-CM

## 2017-11-05 DIAGNOSIS — R278 Other lack of coordination: Secondary | ICD-10-CM

## 2017-11-05 DIAGNOSIS — I69018 Other symptoms and signs involving cognitive functions following nontraumatic subarachnoid hemorrhage: Secondary | ICD-10-CM

## 2017-11-05 DIAGNOSIS — R260 Ataxic gait: Secondary | ICD-10-CM

## 2017-11-05 DIAGNOSIS — R414 Neurologic neglect syndrome: Secondary | ICD-10-CM

## 2017-11-05 DIAGNOSIS — R41842 Visuospatial deficit: Secondary | ICD-10-CM

## 2017-11-05 DIAGNOSIS — R270 Ataxia, unspecified: Secondary | ICD-10-CM

## 2017-11-05 NOTE — Therapy (Signed)
[facility] [place] [address] [place], [place], [address] Phone: [phone]   Fax:  [phone]  Occupational Therapy Treatment  Patient Details  Name: [name] Barnett MRN: [id number] Date of Birth: 08/06/1976 Referring Provider: Dr. Alysia Penna   Encounter Date: 11/05/2017  OT End of Session - 11/05/17 1454    Visit Number  26    Number of Visits  30    Date for OT Re-Evaluation  11/17/18    Authorization Type  BCBS, no visit limit/auth req.    Authorization Time Period  renewal completed 09/19/17 for 8 wks    OT Start Time  1451    OT Stop Time  1535    OT Time Calculation (min)  44 min    Activity Tolerance  Patient tolerated treatment well    Behavior During Therapy  WFL for tasks assessed/performed       Past Medical History:  Diagnosis Date  . Duodenal obstruction   . GERD (gastroesophageal reflux disease)   . Hypertension   . Stroke [facility])    april 2018    Past Surgical History:  Procedure Laterality Date  . ABDOMINAL HYSTERECTOMY    . BALLOON DILATION  12/31/2011   Procedure: BALLOON DILATION;  Surgeon: Missy Sabins, MD;  Location: [facility] ENDOSCOPY;  Service: Endoscopy;  Laterality: N/A;  . CHOLECYSTECTOMY  07/28/11  . ESOPHAGOGASTRODUODENOSCOPY  10/17/2011   Procedure: ESOPHAGOGASTRODUODENOSCOPY (EGD);  Surgeon: Missy Sabins, MD;  Location: [facility] ENDOSCOPY;  Service: Endoscopy;  Laterality: N/A;  . IR ANGIO INTRA EXTRACRAN SEL INTERNAL CAROTID BILAT MOD SED  04/07/2017  . IR ANGIO VERTEBRAL SEL VERTEBRAL UNI R MOD SED  04/07/2017  . IR ANGIOGRAM FOLLOW UP STUDY  04/07/2017  . IR ANGIOGRAM FOLLOW UP STUDY  04/07/2017  . IR ANGIOGRAM FOLLOW UP STUDY  04/07/2017  . IR ANGIOGRAM FOLLOW UP STUDY  04/07/2017  . IR ANGIOGRAM FOLLOW UP STUDY  04/07/2017  . IR ANGIOGRAM SELECTIVE EACH ADDITIONAL VESSEL  04/07/2017  . IR TRANSCATH/EMBOLIZ  04/07/2017  . PARS PLANA VITRECTOMY Right 05/01/2017   Procedure: PARS PLANA  VITRECTOMY WITH 25 GAUGE RIGHT EYE, endolaser photocoaglation;  Surgeon: Jalene Mullet, MD;  Location: [place];  Service: Ophthalmology;  Laterality: Right;  . RADIOLOGY WITH ANESTHESIA N/A 04/07/2017   Procedure: RADIOLOGY WITH ANESTHESIA;  Surgeon: Consuella Lose, MD;  Location: [place];  Service: Radiology;  Laterality: N/A;  . REPLACEMENT TOTAL KNEE BILATERAL  2004  . TEMPOROMANDIBULAR JOINT SURGERY     2 surgeries  . TUMOR REMOVAL    . VAGOTOMY  01/23/2012   Procedure: VAGOTOMY, antrectomy and BII;  Surgeon: Haywood Lasso, MD;  Location: [place];  Service: General;  Laterality: N/A;  Laparotomy with vagotomy.    There were no vitals filed for this visit.  Subjective Assessment - 11/05/17 1452    Subjective   Pt reports that she feels that therapy is making a difference and would like to continue    Patient is accompained by:  Family member    Pertinent History  Subarachnoid hemorrhage due to ruptured aneurysm, ataxia (L cerebellar); HTN, R side decr sensation, vertical diplopia/visual deficits, ataxia, Bilateral retinal hemorhage associated with SAH (with surgery on R eye in hospital and L approx 1 month ago)    Limitations  visual deficits, fall risk, impulsivity/cognitive deficits    Patient Stated Goals  be able to walk and use L arm    Currently in Pain?  Yes  Pain Score  10-Worst pain ever    Pain Location  Groin    Pain Orientation  Right    Pain Descriptors / Indicators  Shooting    Pain Onset  More than a month ago    Pain Frequency  Intermittent    Aggravating Factors   immobility    Pain Relieving Factors  walking       Began checking goals and discussing progress.  Pt reports that she is now going to the bathroom by herself, but dtr checks on her prn (if she is in bathroom for a while).  Pt reports desire to continue with OT.  In sitting, functional reaching with LUE to place washers on vertical pole.  Min-mod cueing to slow down, watch hand to assist with  coordination, midline alignment, and to avoid compensation patterns.  Pt demo improving control today.  Environmental scanning/navigation in minimally distracting environment with 100% accuracy for scanning and close supervision for mobility with min-mod cueing for staying inside RW, putting less weight through UEs/avoid shoulder hike.    Sit>stand and stand>sit with min cueing inconsistently for controlled movement/strategies.  Placing grooved peg in pegboard with max difficulty and cueing for compensation.  Pt only able to put in 1 with incr time/effort.  Pt quickly frustrated and was cued to stop, take a deep breath, and then try again in activities become frustrating.                   OT Short Term Goals - 10/16/17 1003      OT SHORT TERM GOAL #1   Title  Pt/caregiver will be independent with initial HEP.    Time  4    Period  Weeks    Status  Achieved 08/19/17:  with cueing from husband      OT [place] #2   Title  Pt will be use LUE as nondominant assist at least 50% of the time for ADLs without cueing.    Time  4    Period  Weeks    Status  Achieved 08/19/17:  approx 40% of the time.  09/19/17:  90%      OT SHORT TERM GOAL #3   Title  Pt will improve LUE coordination/control to improve score on box and blocks test by at least 6 with LUE.    Baseline  19 blocks    Time  4    Period  Weeks    Status  Achieved 08/22/17:  18, 20 blocks.  09/19/17:  25 blocks      OT SHORT TERM GOAL #4   Title  Pt will perform simple home maintenance task/snack prep in sitting with set-up.    Time  4    Period  Weeks    Status  Achieved 08/19/17:  met in clinic with set-up and cueing/prompts for encouragement/to slow down      OT SHORT TERM GOAL #5   Title  Pt/husband will verbalize understanding of visual compensation stategies for improved safety/incr ease with ADLs.    Time  4    Period  Weeks    Status  Achieved      OT SHORT TERM GOAL #6   Title  Pt will perform  simple environmental scanning/navigation with CGA.--check STGs 10/18/17    Time  4    Period  Weeks    Status  Achieved 10/16/17:  met at approx this level      OT SHORT TERM GOAL #7  Title  Pt will complete functional task with LUE with no more than min cueing for impulsivity.      Time  4    Period  Weeks    Status  On-going 10/16/17:  min-mod cueing        OT Long Term Goals - 11/05/17 1501      OT LONG TERM GOAL #1   Title  Pt/caregiver will be independent with updated HEP.--check LTGs 12/18/16    Time  8    Period  Weeks    Status  On-going      OT LONG TERM GOAL #2   Title  Pt will be use LUE as nondominant assist at least 75% of the time for ADLs without cueing.    Time  8    Period  Weeks    Status  Achieved uses 75% x however needs prompting at times.  09/19/17:  met per pt  90%      OT LONG TERM GOAL #3   Title  Pt will perform toileting mod I (including transfer).    Time  8    Period  Weeks    Status  Achieved supervision.  09/19/17:  continues to need superivsion for mobility.  11/05/17  met      OT LONG TERM GOAL #4   Title  Pt will improve LUE coordination/control to improve score on box and blocks test by at least 12 with LUE.    Baseline  19 blocks    Time  8    Period  Weeks    Status  On-going 18, 19 blocks.  09/19/17:  25 blocks      OT LONG TERM GOAL #5   Title  Pt will perform simple home maintenance task/snack prep in standing with set-up/supervision.    Time  8    Period  Weeks    Status  On-going Pt is not perfoming consistely, she has made a sandwich in seated      OT LONG TERM GOAL #6   Title  Pt will perform simple environmental scanning/navigation with supervision.    Time  8    Period  Weeks    Status  On-going 11/05/17  min cueing and supervision for mobility, environmental scanning with good accuracy            Plan - 11/05/17 1455    Clinical Impression Statement  Pt is progressing towards goals with improving LUE functional  use, incr carryover of functional recommendations, and improving functional mobilty.      Rehab Potential  Good    Current Impairments/barriers affecting progress:  cognitive deficits, impulsivity    OT Frequency  2x / week    OT Duration  8 weeks    OT Treatment/Interventions  Self-care/ADL training;DME and/or AE instruction;Patient/family education;Therapeutic exercises;Balance training;Fluidtherapy;Moist Heat;Ultrasound;Therapeutic exercise;Therapeutic activities;Cryotherapy;Neuromuscular education;Functional Mobility Training;Passive range of motion;Cognitive remediation/compensation;Visual/perceptual remediation/compensation;Manual Therapy    Plan  check remaining goals and anticipate renewal next week; simple home maintenance tasks, LUE functional use    OT Home Exercise Plan  Education provided:  HEP for LUE control, coordination, and functional use    Consulted and Agree with Plan of Care  Patient;Family member/caregiver    Family Member Consulted  mother       Patient will benefit from skilled therapeutic intervention in order to improve the following deficits and impairments:  Decreased coordination, Decreased range of motion, Difficulty walking, Abnormal gait, Decreased safety awareness, Impaired sensation, Decreased knowledge of precautions, Decreased balance,  Decreased knowledge of use of DME, Impaired UE functional use, Decreased cognition, Decreased mobility, Decreased strength, Impaired vision/preception, Impaired perceived functional ability  Visit Diagnosis: Other lack of coordination  Unsteadiness on feet  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 Weakness with dizziness-  since [facility] and CVA 04/07/17 08/07/2017  . Disturbances of vision, late effect of stroke 08/06/2017  . Health education/counseling 08/05/2017  . High risk medications (not anticoagulants) long-term use 08/05/2017  . Neuritis-  R sided:  arm and leg/ body due to stroke 08/05/2017  . Elevated LDL cholesterol level 07/11/2017  . [employer] (NIH) Stroke Scale limb ataxia score 2, ataxia present in two limbs 06/25/2017  . 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 Elevated vitamin B12 level 05/30/2017  . Vitamin D deficiency 05/29/2017  . History of tobacco abuse-  30pk yr hx - quit 04/07/17 05/21/2017  . Gait disturbance, post-stroke 05/14/2017  . Benign essential HTN   . Vitreous hemorrhage of right eye ([place])   . Adjustment disorder with mixed anxiety and depressed mood   . 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 s/p SAH (subarachnoid hemorrhage) ([place]) 04/19/2017  . 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 Hypoxia   . Subarachnoid hemorrhage due to ruptured aneurysm ([place]) 04/07/2017  . CVA (cerebral vascular accident) ([place]) 04/07/2017  . Elevated gastrin level 01/25/2012  . Hypokalemia 01/21/2012  . Nausea & vomiting 01/20/2012  . Epigastric pain 01/20/2012  . Duodenal ulcer, acute with obstruction 10/17/2011  . S/P laparoscopic cholecystectomy 10/16/2011    [facility] 11/05/2017, 4:47 PM  [facility] [address] [place], [place], [address] Phone: [phone]   Fax:  [phone]  Name: [name] Barnett MRN: [id number] Date of Birth: 09/23/1976   Vianne Bulls, OTR/L [facility] [address] [place], [place]   [address] [phone] phone [phone] 11/05/17 4:47 PM

## 2017-11-05 NOTE — Therapy (Signed)
[facility] [address] [place], [place], [address] Phone: [phone]   Fax:  [phone]  Physical Therapy Treatment  Patient Details  Name: [name] Barnett MRN: [id number] Date of Birth: Mar 23, 1976 Referring Provider: Charlett Blake, MD   Encounter Date: 11/05/2017  PT End of Session - 11/05/17 1617    Visit Number  29    Number of Visits  30    Date for PT Re-Evaluation  11/12/17    Authorization Type  BCBS    PT Start Time  1404    PT Stop Time  1447    PT Time Calculation (min)  43 min    Activity Tolerance  Patient limited by pain    Behavior During Therapy  [facility] for tasks assessed/performed       Past Medical History:  Diagnosis Date  . Duodenal obstruction   . GERD (gastroesophageal reflux disease)   . Hypertension   . Stroke [facility])    april 2018    Past Surgical History:  Procedure Laterality Date  . ABDOMINAL HYSTERECTOMY    . BALLOON DILATION  12/31/2011   Procedure: BALLOON DILATION;  Surgeon: Missy Sabins, MD;  Location: [facility] ENDOSCOPY;  Service: Endoscopy;  Laterality: N/A;  . CHOLECYSTECTOMY  07/28/11  . ESOPHAGOGASTRODUODENOSCOPY  10/17/2011   Procedure: ESOPHAGOGASTRODUODENOSCOPY (EGD);  Surgeon: Missy Sabins, MD;  Location: [facility] ENDOSCOPY;  Service: Endoscopy;  Laterality: N/A;  . IR ANGIO INTRA EXTRACRAN SEL INTERNAL CAROTID BILAT MOD SED  04/07/2017  . IR ANGIO VERTEBRAL SEL VERTEBRAL UNI R MOD SED  04/07/2017  . IR ANGIOGRAM FOLLOW UP STUDY  04/07/2017  . IR ANGIOGRAM FOLLOW UP STUDY  04/07/2017  . IR ANGIOGRAM FOLLOW UP STUDY  04/07/2017  . IR ANGIOGRAM FOLLOW UP STUDY  04/07/2017  . IR ANGIOGRAM FOLLOW UP STUDY  04/07/2017  . IR ANGIOGRAM SELECTIVE EACH ADDITIONAL VESSEL  04/07/2017  . IR TRANSCATH/EMBOLIZ  04/07/2017  . PARS PLANA VITRECTOMY Right 05/01/2017   Procedure: PARS PLANA VITRECTOMY WITH 25 GAUGE RIGHT EYE, endolaser photocoaglation;  Surgeon: Jalene Mullet, MD;  Location: [place];   Service: Ophthalmology;  Laterality: Right;  . RADIOLOGY WITH ANESTHESIA N/A 04/07/2017   Procedure: RADIOLOGY WITH ANESTHESIA;  Surgeon: Consuella Lose, MD;  Location: [place];  Service: Radiology;  Laterality: N/A;  . REPLACEMENT TOTAL KNEE BILATERAL  2004  . TEMPOROMANDIBULAR JOINT SURGERY     2 surgeries  . TUMOR REMOVAL    . VAGOTOMY  01/23/2012   Procedure: VAGOTOMY, antrectomy and BII;  Surgeon: Haywood Lasso, MD;  Location: [place];  Service: General;  Laterality: N/A;  Laparotomy with vagotomy.    There were no vitals filed for this visit.  Subjective Assessment - 11/05/17 1407    Subjective  RLE pain continues to be 10/10 today; reports mild relief when performing exercises/stretches but does not persist.    Patient is accompained by:  Family member    Limitations  Standing;Walking    Currently in Pain?  Yes    Pain Score  10-Worst pain ever    Pain Location  Groin    Pain Orientation  Right;Anterior    Pain Descriptors / Indicators  Shooting    Pain Type  Neuropathic pain             [facility] Adult PT Treatment/Exercise - 11/05/17 1410      Ambulation/Gait   Ambulation/Gait  Yes    Ambulation/Gait Assistance  4: Min assist;3: Mod assist  Ambulation/Gait Assistance Details  with therapist supporting and facilitating on L side for full lateral weight shifting and cues for full step length and heel strike bilaterally    Ambulation Distance (Feet)  115 Feet    Assistive device  None    Gait Pattern  Step-through pattern;Decreased step length - right;Decreased step length - left;Decreased stride length;Decreased hip/knee flexion - right;Decreased hip/knee flexion - left;Decreased weight shift to left;Ataxic improved coordination, stance time and more narrow BOS    Ambulation Surface  Level;Indoor      Neuro Re-ed    Neuro Re-ed Details   NMR in sitting with mirror therapy for desensitization of R neuropathic pain; mirror placed to block RLE with pt focusing on image  of LLE in  mirror during various LE movements.  During activity pt reported complete resolution of pain in RLE; pain returned when mirror returned and pt began to move and WB through RLE again.  Transitioned to balance in standing:  In // bars performed walking forwards with one foot wedged on blue balance foam with 2 > 1 > no UE support to focus on full lateral weight shifting and step length/foot clearance and then performing forwards/backward taps with R then LLE, contralateral LE on blue foam to facilitate increased attention to weight shifting anterior/posterior and lateral.  Following NMR performed gait for carryover of weight shift training.             PT Education - 11/05/17 1616    Education provided  Yes    Education Details  plan for recertification next week    Person(s) Educated  Patient;Parent(s)    Methods  Explanation    Comprehension  Verbalized understanding       PT Short Term Goals - 10/17/17 1628      PT SHORT TERM GOAL #1   Title  Pt will participate in further assessment of falls risk during gait with DGI    Baseline  10/07/17: pt not safe at this time to test this, continue to assess when appropriate    Time  4    Period  Weeks    Status  Unable to assess      PT SHORT TERM GOAL #2   Title  Pt will decrease falls risk with gait as indicated by increase in gait velocity with LRAD to > or = 2.0 ft/sec    Baseline  1.78 ft/sec with RW    Time  4    Period  Weeks    Status  Not Met      PT SHORT TERM GOAL #3   Title  Pt will decrease falls risk as indicated by increase in BERG balance score to > or = 45/56    Baseline  10/07/17: 39/56 scored today, not to goal    Time  4    Period  Weeks    Status  Not Met      PT SHORT TERM GOAL #4   Title  Pt will ambulate >200' on indoor surfaces with cane (SPC with or without quad tip) and negotiate 4 stairs with one rail and cane, alternating sequence with min A     Baseline  partially met, felt that she needed  mod cues and mod A for safe gait pattern. 10/17/17     Time  4    Period  Weeks    Status  Not Met        PT Long Term Goals - 10/17/17 2683  PT LONG TERM GOAL #1   Title  Pt and husband will demonstrate independence with HEP    Time  8    Period  Weeks    Status  On-going      PT LONG TERM GOAL #2   Title  Pt will demonstrate improved balance and decreased falls risk as indicated by BERG balance score of > or = 45/56    Baseline  39/56    Time  8    Period  Weeks    Status  On-going    Target Date  11/12/17      PT LONG TERM GOAL #3   Title  Pt will ambulate with cane x 300' over paved outdoor surfaces (curbs and inclines) with supervision; will negotiate 4 stairs with one rail and cane with alternating sequence with supervision    Time  8    Period  Weeks    Status  Revised    Target Date  11/12/17      PT LONG TERM GOAL #4   Title  Pt will report 15% improvement in Neuro QOL-LE    Baseline  31.3%    Time  8    Period  Weeks    Status  On-going    Target Date  11/12/17      PT LONG TERM GOAL #5   Title  Pt will decrease falls risk during gait in home/community as indicated by increase in gait velocity to > or = 2.5 ft/sec with LRAD    Baseline  1.78 ft/sec with RW    Time  8    Period  Weeks    Status  Revised    Target Date  11/12/17      PT LONG TERM GOAL #6   Title  Pt will demonstrate decreased falls risk with gait as indicated by increase in DGI score by 4 points    Baseline  TBD    Time  8    Period  Weeks    Status  New            Plan - 11/05/17 1617    Clinical Impression Statement  Treatment session continued to focus on treatment of RLE neuropathic pain; pt with little to no relief with stretching/contract-relax.  Educated pt on use of mirror therapy for desensitization with immediate improvement in pain during mirror visualization.  Transitioned to balance and weight shift training with carryover to gait over level ground.  Will continue  to progress with plan to reassess LTG next week and recertify x 8 more weeks.    Rehab Potential  Good    PT Frequency  2x / week    PT Duration  8 weeks    PT Treatment/Interventions  ADLs/Self Care Home Management;Aquatic Therapy;DME Instruction;Gait training;Stair training;Functional mobility training;Therapeutic activities;Therapeutic exercise;Balance training;Neuromuscular re-education;Patient/family education;Vestibular;Visual/perceptual remediation/compensation;Cognitive remediation;Orthotic Fit/Training    PT Next Visit Plan  ASSESS VITALS AT [facility] VISIT.  LTG due by 17/5 - plan to recert x 8 more weeks.  Assess DGI.  Continue with physioball exercises for postural control.  Gait and weight shift training.  Mirror therapy for RLE desensitization.    Consulted and Agree with Plan of Care  Patient;Family member/caregiver    Family Member Consulted  husband       Patient will benefit from skilled therapeutic intervention in order to improve the following deficits and impairments:  Abnormal gait, Decreased balance, Decreased cognition, Decreased coordination, Decreased strength, Difficulty walking, Dizziness, Impaired sensation, Impaired  vision/preception, Pain, Decreased activity tolerance, Decreased mobility  Visit Diagnosis: Ataxic gait  Other lack of coordination  Other abnormalities of gait and mobility  Unsteadiness on feet  Dizziness and giddiness     Problem List Patient Active Problem List   Diagnosis Date Noted  . 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 Weakness with dizziness-  since [facility] and CVA 04/07/17 08/07/2017  . Disturbances of vision, late effect of stroke 08/06/2017  . Health education/counseling 08/05/2017  . High risk medications (not anticoagulants) long-term use 08/05/2017  . Neuritis-  R sided:  arm and leg/ body due to stroke 08/05/2017  . Elevated LDL cholesterol level 07/11/2017  . [employer] (NIH) Stroke Scale limb ataxia score 2, ataxia  present in two limbs 06/25/2017  . 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 Elevated vitamin B12 level 05/30/2017  . Vitamin D deficiency 05/29/2017  . History of tobacco abuse-  30pk yr hx - quit 04/07/17 05/21/2017  . Gait disturbance, post-stroke 05/14/2017  . Benign essential HTN   . Vitreous hemorrhage of right eye ([place])   . Adjustment disorder with mixed anxiety and depressed mood   . 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 s/p SAH (subarachnoid hemorrhage) ([place]) 04/19/2017  . 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 Hypoxia   . Subarachnoid hemorrhage due to ruptured aneurysm ([place]) 04/07/2017  . CVA (cerebral vascular accident) ([place]) 04/07/2017  . Elevated gastrin level 01/25/2012  . Hypokalemia 01/21/2012  . Nausea & vomiting 01/20/2012  . Epigastric pain 01/20/2012  . Duodenal ulcer, acute with obstruction 10/17/2011  . S/P laparoscopic cholecystectomy 10/16/2011    [name] Barnett, PT, DPT 11/05/17    4:25 PM    [facility] [address] [place], [place], [address] Phone: [phone]   Fax:  [phone]  Name: [name] Barnett MRN: [id number] Date of Birth: 1975/12/19

## 2017-11-07 ENCOUNTER — Encounter: Payer: BLUE CROSS/BLUE SHIELD | Admitting: Occupational Therapy

## 2017-11-07 ENCOUNTER — Ambulatory Visit: Payer: BLUE CROSS/BLUE SHIELD | Admitting: Physical Therapy

## 2017-11-11 ENCOUNTER — Ambulatory Visit: Payer: BLUE CROSS/BLUE SHIELD | Attending: Physical Medicine & Rehabilitation | Admitting: Occupational Therapy

## 2017-11-11 ENCOUNTER — Encounter: Payer: Self-pay | Admitting: Occupational Therapy

## 2017-11-11 ENCOUNTER — Ambulatory Visit: Payer: BLUE CROSS/BLUE SHIELD | Admitting: Physical Therapy

## 2017-11-11 DIAGNOSIS — R42 Dizziness and giddiness: Secondary | ICD-10-CM | POA: Diagnosis present

## 2017-11-11 DIAGNOSIS — R2681 Unsteadiness on feet: Secondary | ICD-10-CM

## 2017-11-11 DIAGNOSIS — I69018 Other symptoms and signs involving cognitive functions following nontraumatic subarachnoid hemorrhage: Secondary | ICD-10-CM | POA: Diagnosis present

## 2017-11-11 DIAGNOSIS — R41842 Visuospatial deficit: Secondary | ICD-10-CM | POA: Diagnosis present

## 2017-11-11 DIAGNOSIS — R278 Other lack of coordination: Secondary | ICD-10-CM

## 2017-11-11 DIAGNOSIS — R260 Ataxic gait: Secondary | ICD-10-CM | POA: Diagnosis present

## 2017-11-11 DIAGNOSIS — R208 Other disturbances of skin sensation: Secondary | ICD-10-CM

## 2017-11-11 DIAGNOSIS — R2689 Other abnormalities of gait and mobility: Secondary | ICD-10-CM

## 2017-11-11 DIAGNOSIS — R414 Neurologic neglect syndrome: Secondary | ICD-10-CM | POA: Diagnosis present

## 2017-11-11 DIAGNOSIS — R270 Ataxia, unspecified: Secondary | ICD-10-CM | POA: Diagnosis present

## 2017-11-11 DIAGNOSIS — R41840 Attention and concentration deficit: Secondary | ICD-10-CM | POA: Diagnosis present

## 2017-11-11 DIAGNOSIS — I69054 Hemiplegia and hemiparesis following nontraumatic subarachnoid hemorrhage affecting left non-dominant side: Secondary | ICD-10-CM

## 2017-11-11 NOTE — Therapy (Addendum)
[facility] [address] [place], [place], [address] Phone: [phone]   Fax:  [phone]  Physical Therapy Treatment  Patient Details  Name: [name] Barnett MRN: [id number] Date of Birth: Mar 20, 1976 Referring Provider: Charlett Blake, MD   Encounter Date: 11/11/2017  PT End of Session - 11/11/17 1702    Visit Number  30    Number of Visits  30    Date for PT Re-Evaluation  11/12/17    Authorization Type  BCBS    PT Start Time  1445    PT Stop Time  1530    PT Time Calculation (min)  45 min    Equipment Utilized During Treatment  Gait belt    Activity Tolerance  Patient tolerated treatment well    Behavior During Therapy  [facility] for tasks assessed/performed       Past Medical History:  Diagnosis Date  . Duodenal obstruction   . GERD (gastroesophageal reflux disease)   . Hypertension   . Stroke [facility])    april 2018    Past Surgical History:  Procedure Laterality Date  . ABDOMINAL HYSTERECTOMY    . BALLOON DILATION  12/31/2011   Procedure: BALLOON DILATION;  Surgeon: Missy Sabins, MD;  Location: [facility] ENDOSCOPY;  Service: Endoscopy;  Laterality: N/A;  . CHOLECYSTECTOMY  07/28/11  . ESOPHAGOGASTRODUODENOSCOPY  10/17/2011   Procedure: ESOPHAGOGASTRODUODENOSCOPY (EGD);  Surgeon: Missy Sabins, MD;  Location: [facility] ENDOSCOPY;  Service: Endoscopy;  Laterality: N/A;  . IR ANGIO INTRA EXTRACRAN SEL INTERNAL CAROTID BILAT MOD SED  04/07/2017  . IR ANGIO VERTEBRAL SEL VERTEBRAL UNI R MOD SED  04/07/2017  . IR ANGIOGRAM FOLLOW UP STUDY  04/07/2017  . IR ANGIOGRAM FOLLOW UP STUDY  04/07/2017  . IR ANGIOGRAM FOLLOW UP STUDY  04/07/2017  . IR ANGIOGRAM FOLLOW UP STUDY  04/07/2017  . IR ANGIOGRAM FOLLOW UP STUDY  04/07/2017  . IR ANGIOGRAM SELECTIVE EACH ADDITIONAL VESSEL  04/07/2017  . IR TRANSCATH/EMBOLIZ  04/07/2017  . PARS PLANA VITRECTOMY Right 05/01/2017   Procedure: PARS PLANA VITRECTOMY WITH 25 GAUGE RIGHT EYE, endolaser  photocoaglation;  Surgeon: Jalene Mullet, MD;  Location: [place];  Service: Ophthalmology;  Laterality: Right;  . RADIOLOGY WITH ANESTHESIA N/A 04/07/2017   Procedure: RADIOLOGY WITH ANESTHESIA;  Surgeon: Consuella Lose, MD;  Location: [place];  Service: Radiology;  Laterality: N/A;  . REPLACEMENT TOTAL KNEE BILATERAL  2004  . TEMPOROMANDIBULAR JOINT SURGERY     2 surgeries  . TUMOR REMOVAL    . VAGOTOMY  01/23/2012   Procedure: VAGOTOMY, antrectomy and BII;  Surgeon: Haywood Lasso, MD;  Location: [place];  Service: General;  Laterality: N/A;  Laparotomy with vagotomy.    There were no vitals filed for this visit.   11/11/17 1450  Symptoms/Limitations  Subjective Pt reporting increased pain today in right leg/groin. has not tried mirror therapy at home as she does not have a Geologist, engineering. discussed places she can obtain a mirror from. No falls.   Patient is accompained by: Family member (spouse)  Limitations Standing;Walking  Pain Assessment  Currently in Pain? Yes  Pain Score 10  Pain Location Groin (down right leg)  Pain Orientation Right  Pain Descriptors / Indicators Sharp;Shooting;Tingling  Pain Type Neuropathic pain  Pain Radiating Towards down thigh and all the way to foot  Pain Onset More than a month ago  Pain Frequency Constant  Aggravating Factors  standing, pressure, weight bearing  Pain Relieving Factors rest  [facility] Adult PT Treatment/Exercise - 11/11/17 0001      Ambulation/Gait   Gait velocity  21.1 = 1.55 ft/sec with RW      Standardized Balance Assessment   Standardized Balance Assessment  Berg Balance Test      Berg Balance Test   Sit to Stand  Able to stand without using hands and stabilize independently    Standing Unsupported  Able to stand safely 2 minutes    Sitting with Back Unsupported but Feet Supported on Floor or Stool  Able to sit safely and securely 2 minutes    Stand to Sit  Sits safely with minimal use of hands    Transfers  Needs one person  to assist    Standing Unsupported with Eyes Closed  Able to stand 10 seconds with supervision    Standing Ubsupported with Feet Together  Able to place feet together independently but unable to hold for 30 seconds    From Standing, Reach Forward with Outstretched Arm  Can reach confidently >25 cm (10")    From Standing Position, Pick up Object from Floor  Able to pick up shoe, needs supervision    From Standing Position, Turn to Look Behind Over each Shoulder  Looks behind one side only/other side shows less weight shift    Turn 360 Degrees  Needs assistance while turning    Standing Unsupported, Alternately Place Feet on Step/Stool  Needs assistance to keep from falling or unable to try    Standing Unsupported, One Foot in [place] to take small step independently and hold 30 seconds    Standing on One Leg  Unable to try or needs assist to prevent fall    Total Score  34      Exercises   Other Exercises   Supine RLE quad contract-relax with LE in extension with 5 sec hold x 10 reps; prone with glute contract-relax with 5 second hold x 10 reps; prone press up to elbow with 5 sec hold x 10 reps to facilitate hip flexor stretch, and desensitization of anterior LE       PT Short Term Goals - 10/17/17 1628      PT SHORT TERM GOAL #1   Title  Pt will participate in further assessment of falls risk during gait with DGI    Baseline  10/07/17: pt not safe at this time to test this, continue to assess when appropriate    Time  4    Period  Weeks    Status  Unable to assess      PT SHORT TERM GOAL #2   Title  Pt will decrease falls risk with gait as indicated by increase in gait velocity with LRAD to > or = 2.0 ft/sec    Baseline  1.78 ft/sec with RW    Time  4    Period  Weeks    Status  Not Met      PT SHORT TERM GOAL #3   Title  Pt will decrease falls risk as indicated by increase in BERG balance score to > or = 45/56    Baseline  10/07/17: 39/56 scored today, not to goal    Time  4     Period  Weeks    Status  Not Met      PT SHORT TERM GOAL #4   Title  Pt will ambulate >200' on indoor surfaces with cane (SPC with or without quad tip) and negotiate 4 stairs with  one rail and cane, alternating sequence with min A     Baseline  partially met, felt that she needed mod cues and mod A for safe gait pattern. 10/17/17     Time  4    Period  Weeks    Status  Not Met        PT Long Term Goals - 11/11/17 1716      PT LONG TERM GOAL #1   Title  Pt and husband will demonstrate independence with HEP    Baseline  Pt reports not doing exercises on a consistent basis - 09-11-17    Time  8    Period  Weeks    Status  On-going      PT LONG TERM GOAL #2   Title  Pt will demonstrate improved balance and decreased falls risk as indicated by BERG balance score of > or = 45/56    Baseline  39/56 (taken week of 11/12)     34/56 taken 12/3       Time  8    Period  Weeks    Status  On-going      PT LONG TERM GOAL #3   Title  Pt will ambulate with cane x 300' over paved outdoor surfaces (curbs and inclines) with supervision; will negotiate 4 stairs with one rail and cane with alternating sequence with supervision    Time  8    Period  Weeks    Status  Not Met      PT LONG TERM GOAL #4   Title  Pt will report 15% improvement in Neuro QOL-LE    Baseline  31.3%    Time  8    Period  Weeks    Status  On-going      PT LONG TERM GOAL #5   Title  Pt will decrease falls risk during gait in home/community as indicated by increase in gait velocity to > or = 2.5 ft/sec with LRAD    Baseline  1.78 ft/sec with RW (taken week of 11/12)   1.55 ft/sec on 12/3    Time  8    Period  Weeks    Status  Not Met      PT LONG TERM GOAL #6   Title  Pt will demonstrate decreased falls risk with gait as indicated by increase in DGI score by 4 points    Baseline  TBD    Time  8    Period  Weeks    Status  New        Plan - 11/11/17 1703    Clinical Impression Statement  Rechecked goals  today for renewal - LTG's have not been met. Pt felt pain in groin area during session. Pt will benefit from further PT sessions to improve her safety and functional mobility.    Rehab Potential  Good    PT Frequency  2x / week    PT Duration  8 weeks    PT Treatment/Interventions  ADLs/Self Care Home Management;Aquatic Therapy;DME Instruction;Gait training;Stair training;Functional mobility training;Therapeutic activities;Therapeutic exercise;Balance training;Neuromuscular re-education;Patient/family education;Vestibular;Visual/perceptual remediation/compensation;Cognitive remediation;Orthotic Fit/Training    PT Next Visit Plan  ASSESS VITALS AT [facility] VISIT.  Complete assessment of LTG's.  Plan to recert x 8 more weeks.  Assess DGI.  Continue with physioball exercises for postural control.  Gait and weight shift training.  Mirror therapy for RLE desensitization.    Consulted and Agree with Plan of Care  Patient;Family member/caregiver    Family  Member Consulted  husband       Patient will benefit from skilled therapeutic intervention in order to improve the following deficits and impairments:  Abnormal gait, Decreased balance, Decreased cognition, Decreased coordination, Decreased strength, Difficulty walking, Dizziness, Impaired sensation, Impaired vision/preception, Pain, Decreased activity tolerance, Decreased mobility  Visit Diagnosis: Ataxic gait  Other lack of coordination  Other abnormalities of gait and mobility  Unsteadiness on feet  Dizziness and giddiness     Problem List Patient Active Problem List   Diagnosis Date Noted  . 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 Weakness with dizziness-  since [facility] and CVA 04/07/17 08/07/2017  . Disturbances of vision, late effect of stroke 08/06/2017  . Health education/counseling 08/05/2017  . High risk medications (not anticoagulants) long-term use 08/05/2017  . Neuritis-  R sided:  arm and leg/ body due to stroke 08/05/2017  . Elevated  LDL cholesterol level 07/11/2017  . [employer] (NIH) Stroke Scale limb ataxia score 2, ataxia present in two limbs 06/25/2017  . 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 Elevated vitamin B12 level 05/30/2017  . Vitamin D deficiency 05/29/2017  . History of tobacco abuse-  30pk yr hx - quit 04/07/17 05/21/2017  . Gait disturbance, post-stroke 05/14/2017  . Benign essential HTN   . Vitreous hemorrhage of right eye ([place])   . Adjustment disorder with mixed anxiety and depressed mood   . 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 s/p SAH (subarachnoid hemorrhage) ([place]) 04/19/2017  . 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 Hypoxia   . Subarachnoid hemorrhage due to ruptured aneurysm ([place]) 04/07/2017  . CVA (cerebral vascular accident) ([place]) 04/07/2017  . Elevated gastrin level 01/25/2012  . Hypokalemia 01/21/2012  . Nausea & vomiting 01/20/2012  . Epigastric pain 01/20/2012  . Duodenal ulcer, acute with obstruction 10/17/2011  . S/P laparoscopic cholecystectomy 10/16/2011    Andria Meuse, SPTA 11/11/2017, 5:26 PM  [facility] [address] [place], [place], [address] Phone: [phone]   Fax:  [phone]  Name: [name] Barnett MRN: [id number] Date of Birth: 02-Dec-1976

## 2017-11-11 NOTE — Therapy (Signed)
[facility] [address] [place], [place], [address] Phone: [phone]   Fax:  [phone]  Occupational Therapy Treatment  Patient Details  Name: [name] Barnett MRN: [id number] Date of Birth: 04/22/1976 Referring Provider: Dr. Andrew Kirsteins   Encounter Date: 11/11/2017  OT End of Session - 11/11/17 1715    Visit Number  27    Number of Visits  30    Date for OT Re-Evaluation  11/17/18    Authorization Type  BCBS, no visit limit/auth req.    Authorization Time Period  renewal completed 09/19/17 for 8 wks    OT Start Time  1538    OT Stop Time  1623    OT Time Calculation (min)  45 min    Activity Tolerance  Patient tolerated treatment well    Behavior During Therapy  WFL for tasks assessed/performed       Past Medical History:  Diagnosis Date  . Duodenal obstruction   . GERD (gastroesophageal reflux disease)   . Hypertension   . Stroke ([name])    april 2018    Past Surgical History:  Procedure Laterality Date  . ABDOMINAL HYSTERECTOMY    . BALLOON DILATION  12/31/2011   Procedure: BALLOON DILATION;  Surgeon: [name], MD;  Location: [facility];  Service: Endoscopy;  Laterality: N/A;  . CHOLECYSTECTOMY  07/28/11  . ESOPHAGOGASTRODUODENOSCOPY  10/17/2011   Procedure: ESOPHAGOGASTRODUODENOSCOPY (EGD);  Surgeon: [name], MD;  Location: [facility];  Service: Endoscopy;  Laterality: N/A;  . IR ANGIO INTRA EXTRACRAN SEL INTERNAL CAROTID BILAT MOD SED  04/07/2017  . IR ANGIO VERTEBRAL SEL VERTEBRAL UNI R MOD SED  04/07/2017  . IR ANGIOGRAM FOLLOW UP STUDY  04/07/2017  . IR ANGIOGRAM FOLLOW UP STUDY  04/07/2017  . IR ANGIOGRAM FOLLOW UP STUDY  04/07/2017  . IR ANGIOGRAM FOLLOW UP STUDY  04/07/2017  . IR ANGIOGRAM FOLLOW UP STUDY  04/07/2017  . IR ANGIOGRAM SELECTIVE EACH ADDITIONAL VESSEL  04/07/2017  . IR TRANSCATH/EMBOLIZ  04/07/2017  . PARS PLANA VITRECTOMY Right 05/01/2017   Procedure: PARS PLANA  VITRECTOMY WITH 25 GAUGE RIGHT EYE, endolaser photocoaglation;  Surgeon: Patel, Narendra, MD;  Location: [facility] OR;  Service: Ophthalmology;  Laterality: Right;  . RADIOLOGY WITH ANESTHESIA N/A 04/07/2017   Procedure: RADIOLOGY WITH ANESTHESIA;  Surgeon: Neelesh Nundkumar, MD;  Location: [facility] OR;  Service: Radiology;  Laterality: N/A;  . REPLACEMENT TOTAL KNEE BILATERAL  2004  . TEMPOROMANDIBULAR JOINT SURGERY     2 surgeries  . TUMOR REMOVAL    . VAGOTOMY  01/23/2012   Procedure: VAGOTOMY, antrectomy and BII;  Surgeon: Christian J Streck, MD;  Location: [facility] OR;  Service: General;  Laterality: N/A;  Laparotomy with vagotomy.    There were no vitals filed for this visit.  Subjective Assessment - 11/11/17 1536    Subjective   Pt reports continued Leg pain    Patient is accompained by:  Family member    Pertinent History  Subarachnoid hemorrhage due to ruptured aneurysm, ataxia (L cerebellar); HTN, R side decr sensation, vertical diplopia/visual deficits, ataxia, Bilateral retinal hemorhage associated with SAH (with surgery on R eye in hospital and L approx 1 month ago)    Limitations  visual deficits, fall risk, impulsivity/cognitive deficits    Patient Stated Goals  be able to walk and use L arm    Currently in Pain?  Yes    Pain Score  10-Worst pain ever    Pain   Location  Groin    Pain Orientation  Right    Pain Descriptors / Indicators  Shooting    Pain Onset  More than a month ago    Pain Frequency  Intermittent    Aggravating Factors   standing    Pain Relieving Factors  sitting        Standing, putting clothes in washer with LUE.  Then, removed clothes from washer with LUE and put in dryer with RUE.  Removed clothes from dryer and folded.  Pt needed to have rest and sit while folded some clothes, but then able to resume standing.  Pt needed min cueing for midline alignment and to use LUE more and for use of UE support for balance.  No LOB.  Sitting, placing Perfection pieces in board  with mod cueing for tip pinch, to slow down, and to avoid compensation patterns.   Pt asks if she can sweep.  Recommended pt hold onto counter with RUE for balance and sweep with LUE only.  Recommended pt not use dustpan and lean over unless she is able to sit.  Pt verbalized understanding (husband present) and was able to return demo.                     OT Short Term Goals - 10/16/17 1003      OT SHORT TERM GOAL #1   Title  Pt/caregiver will be independent with initial HEP.    Time  4    Period  Weeks    Status  Achieved 08/19/17:  with cueing from husband      OT SHORT TERM GOAL #2   Title  Pt will be use LUE as nondominant assist at least 50% of the time for ADLs without cueing.    Time  4    Period  Weeks    Status  Achieved 08/19/17:  approx 40% of the time.  09/19/17:  90%      OT SHORT TERM GOAL #3   Title  Pt will improve LUE coordination/control to improve score on box and blocks test by at least 6 with LUE.    Baseline  19 blocks    Time  4    Period  Weeks    Status  Achieved 08/22/17:  18, 20 blocks.  09/19/17:  25 blocks      OT SHORT TERM GOAL #4   Title  Pt will perform simple home maintenance task/snack prep in sitting with set-up.    Time  4    Period  Weeks    Status  Achieved 08/19/17:  met in clinic with set-up and cueing/prompts for encouragement/to slow down      OT SHORT TERM GOAL #5   Title  Pt/husband will verbalize understanding of visual compensation stategies for improved safety/incr ease with ADLs.    Time  4    Period  Weeks    Status  Achieved      OT SHORT TERM GOAL #6   Title  Pt will perform simple environmental scanning/navigation with CGA.--check STGs 10/18/17    Time  4    Period  Weeks    Status  Achieved 10/16/17:  met at approx this level      OT SHORT TERM GOAL #7   Title  Pt will complete functional task with LUE with no more than min cueing for impulsivity.      Time  4    Period  Weeks      Status  On-going  10/16/17:  min-mod cueing        OT Long Term Goals - 11/05/17 1501      OT LONG TERM GOAL #1   Title  Pt/caregiver will be independent with updated HEP.--check LTGs 12/18/16    Time  8    Period  Weeks    Status  On-going      OT LONG TERM GOAL #2   Title  Pt will be use LUE as nondominant assist at least 75% of the time for ADLs without cueing.    Time  8    Period  Weeks    Status  Achieved uses 75% x however needs prompting at times.  09/19/17:  met per pt  90%      OT LONG TERM GOAL #3   Title  Pt will perform toileting mod I (including transfer).    Time  8    Period  Weeks    Status  Achieved supervision.  09/19/17:  continues to need superivsion for mobility.  11/05/17  met      OT LONG TERM GOAL #4   Title  Pt will improve LUE coordination/control to improve score on box and blocks test by at least 12 with LUE.    Baseline  19 blocks    Time  8    Period  Weeks    Status  On-going 18, 19 blocks.  09/19/17:  25 blocks      OT LONG TERM GOAL #5   Title  Pt will perform simple home maintenance task/snack prep in standing with set-up/supervision.    Time  8    Period  Weeks    Status  On-going Pt is not perfoming consistely, she has made a sandwich in seated      OT LONG TERM GOAL #6   Title  Pt will perform simple environmental scanning/navigation with supervision.    Time  8    Period  Weeks    Status  On-going 11/05/17  min cueing and supervision for mobility, environmental scanning with good accuracy            Plan - 11/11/17 1716    Clinical Impression Statement  Pt is progressing towards goals with improving LUE functional use and increasing IADL performance.    Rehab Potential  Good    Current Impairments/barriers affecting progress:  cognitive deficits, impulsivity    OT Frequency  2x / week    OT Duration  8 weeks    Plan  check remaining goals and renew OT, simple home maintenance task, LUE functional use    OT Home Exercise Plan  Education  provided:  HEP for LUE control, coordination, and functional use    Consulted and Agree with Plan of Care  Patient;Family member/caregiver    Family Member Consulted  mother       Patient will benefit from skilled therapeutic intervention in order to improve the following deficits and impairments:  Decreased coordination, Decreased range of motion, Difficulty walking, Abnormal gait, Decreased safety awareness, Impaired sensation, Decreased knowledge of precautions, Decreased balance, Decreased knowledge of use of DME, Impaired UE functional use, Decreased cognition, Decreased mobility, Decreased strength, Impaired vision/preception, Impaired perceived functional ability  Visit Diagnosis: 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 Weakness with dizziness-  since [employer] and CVA 04/07/17 08/07/2017  . Disturbances of vision, late effect of stroke 08/06/2017  . Health education/counseling 08/05/2017  . High risk medications (not anticoagulants) long-term use 08/05/2017  . Neuritis-  R sided:  arm and leg/ body due to stroke 08/05/2017  . Elevated LDL cholesterol level 07/11/2017  . [employer] (NIH) Stroke Scale limb ataxia score 2, ataxia present in two limbs 06/25/2017  . 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 Elevated vitamin B12 level 05/30/2017  . Vitamin D deficiency 05/29/2017  . History of tobacco abuse-  30pk yr hx - quit 04/07/17  05/21/2017  . Gait disturbance, post-stroke 05/14/2017  . Benign essential HTN   . Vitreous hemorrhage of right eye ([place])   . Adjustment disorder with mixed anxiety and depressed mood   . 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 s/p SAH (subarachnoid hemorrhage) ([place]) 04/19/2017  . 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 Hypoxia   . Subarachnoid hemorrhage due to ruptured aneurysm ([place]) 04/07/2017  . CVA (cerebral vascular accident) ([place]) 04/07/2017  . Elevated gastrin level 01/25/2012  . Hypokalemia 01/21/2012  . Nausea & vomiting 01/20/2012  . Epigastric pain 01/20/2012  . Duodenal ulcer, acute with obstruction 10/17/2011  . S/P laparoscopic cholecystectomy 10/16/2011    [facility] 11/11/2017, 5:18 PM  [place] [address] [place], [place], [address] Phone: [phone]   Fax:  [phone]  Name: [name] Barnett MRN: [id number] Date of Birth: [birth year]   Vianne Bulls, OTR/L [facility] [address] [place], [place]  [address] [phone] phone [phone] 11/11/17 5:18 PM

## 2017-11-11 NOTE — Patient Instructions (Addendum)
    QUAD SET - SUPINE  Lying on firm surface, tighten muscles on top of thighs to push back of knees into surface.  Hold _5__ seconds; relax; repeat 5-10 times. 1 time a day.    Prone Gluteal Squeeze  Begin by lying on your stomach. Tighten your buttocks muscles and squeeze them together. Hold for 5 seconds. Perform 10 reps. 1 time a day    PRONE ON ELBOWS - POE  Lying face down, slowly press up and prop yourself up on your elbows. Hold for 5 seconds. Repeat for 5-10 reps. 1 time a day.

## 2017-11-14 ENCOUNTER — Encounter: Payer: Self-pay | Admitting: Physical Therapy

## 2017-11-14 ENCOUNTER — Ambulatory Visit: Payer: BLUE CROSS/BLUE SHIELD | Admitting: Physical Therapy

## 2017-11-14 ENCOUNTER — Encounter: Payer: Self-pay | Admitting: Occupational Therapy

## 2017-11-14 ENCOUNTER — Ambulatory Visit: Payer: BLUE CROSS/BLUE SHIELD | Admitting: Occupational Therapy

## 2017-11-14 DIAGNOSIS — R41840 Attention and concentration deficit: Secondary | ICD-10-CM

## 2017-11-14 DIAGNOSIS — R414 Neurologic neglect syndrome: Secondary | ICD-10-CM

## 2017-11-14 DIAGNOSIS — R278 Other lack of coordination: Secondary | ICD-10-CM

## 2017-11-14 DIAGNOSIS — R2689 Other abnormalities of gait and mobility: Secondary | ICD-10-CM

## 2017-11-14 DIAGNOSIS — R2681 Unsteadiness on feet: Secondary | ICD-10-CM

## 2017-11-14 DIAGNOSIS — R42 Dizziness and giddiness: Secondary | ICD-10-CM

## 2017-11-14 DIAGNOSIS — R208 Other disturbances of skin sensation: Secondary | ICD-10-CM

## 2017-11-14 DIAGNOSIS — I69054 Hemiplegia and hemiparesis following nontraumatic subarachnoid hemorrhage affecting left non-dominant side: Secondary | ICD-10-CM | POA: Diagnosis not present

## 2017-11-14 DIAGNOSIS — I69018 Other symptoms and signs involving cognitive functions following nontraumatic subarachnoid hemorrhage: Secondary | ICD-10-CM

## 2017-11-14 DIAGNOSIS — R260 Ataxic gait: Secondary | ICD-10-CM

## 2017-11-14 DIAGNOSIS — R270 Ataxia, unspecified: Secondary | ICD-10-CM

## 2017-11-14 DIAGNOSIS — R41842 Visuospatial deficit: Secondary | ICD-10-CM

## 2017-11-14 NOTE — Therapy (Signed)
[facility] [address] [place], [place], [address] Phone: [phone]   Fax:  [phone]  Occupational Therapy Treatment  Patient Details  Name: [name] Barnett MRN: [id number] Date of Birth: 1976-05-12 Referring Provider: Dr. Alysia Penna   Encounter Date: 11/14/2017  OT End of Session - 11/14/17 1538    Visit Number  28    Number of Visits  44 [phone]    Date for OT Re-Evaluation  01/13/18    Authorization Type  BCBS, no visit limit/auth req.    Authorization Time Period  renewal completed 09/19/17 for 8 wks    OT Start Time  1539    OT Stop Time  1625    OT Time Calculation (min)  46 min    Activity Tolerance  Patient tolerated treatment well    Behavior During Therapy  Agitated       Past Medical History:  Diagnosis Date  . Duodenal obstruction   . GERD (gastroesophageal reflux disease)   . Hypertension   . Stroke [facility])    april 2018    Past Surgical History:  Procedure Laterality Date  . ABDOMINAL HYSTERECTOMY    . BALLOON DILATION  12/31/2011   Procedure: BALLOON DILATION;  Surgeon: Missy Sabins, MD;  Location: [facility] ENDOSCOPY;  Service: Endoscopy;  Laterality: N/A;  . CHOLECYSTECTOMY  07/28/11  . ESOPHAGOGASTRODUODENOSCOPY  10/17/2011   Procedure: ESOPHAGOGASTRODUODENOSCOPY (EGD);  Surgeon: Missy Sabins, MD;  Location: [facility] ENDOSCOPY;  Service: Endoscopy;  Laterality: N/A;  . IR ANGIO INTRA EXTRACRAN SEL INTERNAL CAROTID BILAT MOD SED  04/07/2017  . IR ANGIO VERTEBRAL SEL VERTEBRAL UNI R MOD SED  04/07/2017  . IR ANGIOGRAM FOLLOW UP STUDY  04/07/2017  . IR ANGIOGRAM FOLLOW UP STUDY  04/07/2017  . IR ANGIOGRAM FOLLOW UP STUDY  04/07/2017  . IR ANGIOGRAM FOLLOW UP STUDY  04/07/2017  . IR ANGIOGRAM FOLLOW UP STUDY  04/07/2017  . IR ANGIOGRAM SELECTIVE EACH ADDITIONAL VESSEL  04/07/2017  . IR TRANSCATH/EMBOLIZ  04/07/2017  . PARS PLANA VITRECTOMY Right 05/01/2017   Procedure: PARS PLANA VITRECTOMY WITH 25 GAUGE  RIGHT EYE, endolaser photocoaglation;  Surgeon: Jalene Mullet, MD;  Location: [place];  Service: Ophthalmology;  Laterality: Right;  . RADIOLOGY WITH ANESTHESIA N/A 04/07/2017   Procedure: RADIOLOGY WITH ANESTHESIA;  Surgeon: Consuella Lose, MD;  Location: [place];  Service: Radiology;  Laterality: N/A;  . REPLACEMENT TOTAL KNEE BILATERAL  2004  . TEMPOROMANDIBULAR JOINT SURGERY     2 surgeries  . TUMOR REMOVAL    . VAGOTOMY  01/23/2012   Procedure: VAGOTOMY, antrectomy and BII;  Surgeon: Haywood Lasso, MD;  Location: [place];  Service: General;  Laterality: N/A;  Laparotomy with vagotomy.    There were no vitals filed for this visit.  Subjective Assessment - 11/14/17 1715    Subjective   Pt expresses frustration with LUE functional use, R groin pain, feeling of decr progress, and home situation.   Pt reports that she is having a bad day.    Patient is accompained by:  Family member    Pertinent History  Subarachnoid hemorrhage due to ruptured aneurysm, ataxia (L cerebellar); HTN, R side decr sensation, vertical diplopia/visual deficits, ataxia, Bilateral retinal hemorhage associated with SAH (with surgery on R eye in hospital and L approx 1 month ago)    Limitations  visual deficits, fall risk, impulsivity/cognitive deficits    Patient Stated Goals  be able to walk and use L arm  Currently in Pain?  Yes    Pain Score  7     Pain Location  Groin    Pain Orientation  Right    Pain Descriptors / Indicators  Shooting    Pain Type  Neuropathic pain    Pain Onset  More than a month ago    Pain Frequency  Intermittent    Aggravating Factors   standing    Pain Relieving Factors  sitting, improved midline alignment        Placing small pegs in pegboard with LUE with max difficulty; however, frustration level/agitation in general today incr making ataxia worse.  Long discussion regarding progress and reassurance that LUE control is improving; however, frustration incr ataxia.  Pt  reports stress at home with spouse, apathy, and frustration with continued functional difficulties.  Recommended pt discuss with Dr. Letta Pate.  Pt may benefit from neuropsychological assessment/counseling due to poor frustration tolerance affecting physical performance and for coping/adjustment.  Pt reports that she will discuss at next visit.  Checked goals.--see below.    Grasping 1-inch blocks with slow, controlled movement with min-mod cueing using 3point grasp.                     OT Short Term Goals - 11/14/17 1732      OT SHORT TERM GOAL #1   Title  Pt/caregiver will be independent with initial HEP.    Time  4    Period  Weeks    Status  Achieved 08/19/17:  with cueing from husband      OT [place] #2   Title  Pt will be use LUE as nondominant assist at least 50% of the time for ADLs without cueing.    Time  4    Period  Weeks    Status  Achieved 08/19/17:  approx 40% of the time.  09/19/17:  90%      OT SHORT TERM GOAL #3   Title  Pt will improve LUE coordination/control to improve score on box and blocks test by at least 6 with LUE.    Baseline  19 blocks    Time  4    Period  Weeks    Status  Achieved 08/22/17:  18, 20 blocks.  09/19/17:  25 blocks      OT SHORT TERM GOAL #4   Title  Pt will perform simple home maintenance task/snack prep in sitting with set-up.    Time  4    Period  Weeks    Status  Achieved 08/19/17:  met in clinic with set-up and cueing/prompts for encouragement/to slow down      OT SHORT TERM GOAL #5   Title  Pt/husband will verbalize understanding of visual compensation stategies for improved safety/incr ease with ADLs.    Time  4    Period  Weeks    Status  Achieved      OT SHORT TERM GOAL #6   Title  Pt will perform simple environmental scanning/navigation with CGA.--check STGs 10/18/17    Time  4    Period  Weeks    Status  Achieved 10/16/17:  met at approx this level      OT SHORT TERM GOAL #7   Title  Pt will  complete functional task with LUE with no more than min cueing for impulsivity.--check STGs 12/14/16    Time  4    Period  Weeks    Status  On-going 10/16/17:  min-mod cueing; 11/14/17 inconsistent depending on task/frustration level    Target Date  12/14/17      OT SHORT TERM GOAL #8   Title  Pt will perform simple home maintenance task/snack prep in standing with set-up/supervision and no more than 1 v.c. for safety.    Time  4    Period  Weeks    Status  New    Target Date  12/14/17      OT SHORT TERM GOAL  #9   TITLE  Pt will improve coordination for ADLs as shown by completing 9-hole peg test in less than 15mn 30sec.    Baseline  268m 55.44sec    Time  4    Period  Weeks    Status  New    Target Date  12/14/17        OT Long Term Goals - 11/14/17 1734      OT LONG TERM GOAL #1   Title  Pt/caregiver will be independent with updated HEP.--check LTGs 01/13/18    Time  8    Period  Weeks    Status  On-going 11/14/17:  needs further updates    Target Date  01/13/18      OT LONG TERM GOAL #2   Title  Pt will be use LUE as nondominant assist at least 75% of the time for ADLs without cueing.    Time  8    Period  Weeks    Status  Achieved uses 75% x however needs prompting at times.  09/19/17:  met per pt  90%      OT LONG TERM GOAL #3   Title  Pt will perform toileting mod I (including transfer).    Time  8    Period  Weeks    Status  Achieved supervision.  09/19/17:  continues to need superivsion for mobility.  11/05/17  met      OT LONG TERM GOAL #4   Title  Pt will improve LUE coordination/control to improve score on box and blocks to at least 27 with LUE in 2 trials.--revised 11/14/17    Baseline  19 blocks    Time  8    Period  Weeks    Status  Revised 18, 19 blocks.  09/19/17:  25 blocks.  11/14/17:  19 blocks/inconsistent with frustration level      OT LONG TERM GOAL #5   Title  Pt will perform simple home maintenance task/snack prep in standing mod I.--updated  11/14/17    Time  8    Period  Weeks    Status  Revised Pt is not perfoming consistely, she has made a sandwich in seated.  11/14/17:  approximating, supervision/min A and cueing      OT LONG TERM GOAL #6   Title  Pt will perform simple environmental scanning/navigation with mod I.--revised 11/14/17    Time  8    Period  Weeks    Status  Revised 11/05/17  min cueing and supervision for mobility, environmental scanning with good accuracy      OT LONG TERM GOAL #7   Title  Pt will improve coordination for ADLs as shown by completing 9-hole peg test in less than 74m85m    Baseline  74mi59m.44sec    Time  8    Period  Weeks    Status  New            Plan - 11/14/17 1542    Clinical Impression  Statement  Pt is progressing towards goals with improving LUE functional use and increasing IADL performance.  Poor frustration tolerance and continued impulsivity which impacts LUE functional use.  However, due to progress, pt would benefit from continued occupational therapy to maximize LUE functional use and IADL performance for incr safety/independence and decr caregiver burden.    Rehab Potential  Good    Current Impairments/barriers affecting progress:  cognitive deficits, impulsivity    OT Frequency  2x / week    OT Duration  8 weeks    OT Treatment/Interventions  Self-care/ADL training;Therapeutic exercise;DME and/or AE instruction;Therapeutic activities;Cognitive remediation/compensation;Visual/perceptual remediation/compensation;Passive range of motion;Functional Mobility Training;Neuromuscular education;Cryotherapy;Paraffin;Energy conservation;Manual Therapy;Patient/family education;Ultrasound;Balance training;Moist Heat;Fluidtherapy    Plan  renewal completed 11/14/17; simple home maintenance task, LUE functional use    OT Home Exercise Plan  Education provided:  HEP for LUE control, coordination, and functional use    Recommended Other Services  Neuropsychological referral due to poor  frustration tolerance, adjustment/coping    Consulted and Agree with Plan of Care  Patient;Family member/caregiver    Family Member Consulted  mother       Patient will benefit from skilled therapeutic intervention in order to improve the following deficits and impairments:  Decreased coordination, Decreased range of motion, Difficulty walking, Abnormal gait, Decreased safety awareness, Impaired sensation, Decreased knowledge of precautions, Decreased balance, Decreased knowledge of use of DME, Impaired UE functional use, Decreased cognition, Decreased mobility, Decreased strength, Impaired vision/preception, Impaired perceived functional ability  Visit Diagnosis: Other lack of coordination  Unsteadiness on feet  Other abnormalities of gait and mobility  Neurologic neglect syndrome  Other disturbances of skin sensation  Ataxia  Visuospatial deficit  Attention and concentration deficit  Other symptoms and signs involving cognitive functions following nontraumatic subarachnoid hemorrhage  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 Weakness with dizziness-  since [facility] and CVA 04/07/17 08/07/2017  . Disturbances of vision, late effect of stroke 08/06/2017  . Health education/counseling 08/05/2017  . High risk medications (not anticoagulants) long-term use 08/05/2017  . Neuritis-  R sided:  arm and leg/ body due to stroke 08/05/2017  . Elevated LDL cholesterol level 07/11/2017  . [employer] (NIH) Stroke Scale limb ataxia score 2, ataxia present in two limbs 06/25/2017  . 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 Elevated vitamin B12 level 05/30/2017  . Vitamin D deficiency 05/29/2017  . History of tobacco abuse-  30pk yr hx - quit 04/07/17 05/21/2017  . Gait  disturbance, post-stroke 05/14/2017  . Benign essential HTN   . Vitreous hemorrhage of right eye ([place])   . Adjustment disorder with mixed anxiety and depressed mood   . 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 s/p SAH (subarachnoid hemorrhage) ([place]) 04/19/2017  . 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 Hypoxia   . Subarachnoid hemorrhage due to ruptured aneurysm ([place]) 04/07/2017  . CVA (cerebral vascular accident) ([place]) 04/07/2017  . Elevated gastrin level 01/25/2012  . Hypokalemia 01/21/2012  . Nausea & vomiting 01/20/2012  . Epigastric pain 01/20/2012  . Duodenal ulcer, acute with obstruction 10/17/2011  . S/P laparoscopic cholecystectomy 10/16/2011    [facility] 11/14/2017, 5:42 PM  [facility] [address] [place], [place], [address] Phone: [phone]  Fax:  [phone]  Name: BULAH LURIE MRN: [id number] Date of Birth: December 08, 1976   Vianne Bulls, OTR/L [facility] [address] [place], [place]  [address] [phone] phone [phone] 11/14/17 5:42 PM

## 2017-11-14 NOTE — Therapy (Signed)
[facility] [address] [place], [place], [address] Phone: [phone]   Fax:  [phone]  Physical Therapy Treatment  Patient Details  Name: [name] Barnett MRN: [id number] Date of Birth: June 13, 1976 Referring Provider: Charlett Blake, MD   Encounter Date: 11/14/2017  PT End of Session - 11/14/17 1655    Visit Number  31    Number of Visits  46    Date for PT Re-Evaluation  01/13/18    Authorization Type  [employer]    PT Start Time  1450    PT Stop Time  1532    PT Time Calculation (min)  42 min    Activity Tolerance  Patient tolerated treatment well    Behavior During Therapy  [facility] for tasks assessed/performed       Past Medical History:  Diagnosis Date  . Duodenal obstruction   . GERD (gastroesophageal reflux disease)   . Hypertension   . Stroke [facility])    april 2018    Past Surgical History:  Procedure Laterality Date  . ABDOMINAL HYSTERECTOMY    . BALLOON DILATION  12/31/2011   Procedure: BALLOON DILATION;  Surgeon: Missy Sabins, MD;  Location: [facility] ENDOSCOPY;  Service: Endoscopy;  Laterality: N/A;  . CHOLECYSTECTOMY  07/28/11  . ESOPHAGOGASTRODUODENOSCOPY  10/17/2011   Procedure: ESOPHAGOGASTRODUODENOSCOPY (EGD);  Surgeon: Missy Sabins, MD;  Location: [facility] ENDOSCOPY;  Service: Endoscopy;  Laterality: N/A;  . IR ANGIO INTRA EXTRACRAN SEL INTERNAL CAROTID BILAT MOD SED  04/07/2017  . IR ANGIO VERTEBRAL SEL VERTEBRAL UNI R MOD SED  04/07/2017  . IR ANGIOGRAM FOLLOW UP STUDY  04/07/2017  . IR ANGIOGRAM FOLLOW UP STUDY  04/07/2017  . IR ANGIOGRAM FOLLOW UP STUDY  04/07/2017  . IR ANGIOGRAM FOLLOW UP STUDY  04/07/2017  . IR ANGIOGRAM FOLLOW UP STUDY  04/07/2017  . IR ANGIOGRAM SELECTIVE EACH ADDITIONAL VESSEL  04/07/2017  . IR TRANSCATH/EMBOLIZ  04/07/2017  . PARS PLANA VITRECTOMY Right 05/01/2017   Procedure: PARS PLANA VITRECTOMY WITH 25 GAUGE RIGHT EYE, endolaser photocoaglation;  Surgeon: Jalene Mullet, MD;  Location:  [facility];  Service: Ophthalmology;  Laterality: Right;  . RADIOLOGY WITH ANESTHESIA N/A 04/07/2017   Procedure: RADIOLOGY WITH ANESTHESIA;  Surgeon: Consuella Lose, MD;  Location: [facility];  Service: Radiology;  Laterality: N/A;  . REPLACEMENT TOTAL KNEE BILATERAL  2004  . TEMPOROMANDIBULAR JOINT SURGERY     2 surgeries  . TUMOR REMOVAL    . VAGOTOMY  01/23/2012   Procedure: VAGOTOMY, antrectomy and BII;  Surgeon: Haywood Lasso, MD;  Location: [facility];  Service: General;  Laterality: N/A;  Laparotomy with vagotomy.    There were no vitals filed for this visit.  Subjective Assessment - 11/14/17 1456    Subjective  Pain is better today, not a 12.  7/10 today.  Will go to see Dr. Letta Pate tomorrow and is going to ask about a nn block for her RLE.    Patient is accompained by:  Family member spouse    Limitations  Standing;Walking    Currently in Pain?  Yes    Pain Score  7     Pain Location  Groin    Pain Orientation  Right    Pain Descriptors / Indicators  Shooting    Pain Type  Neuropathic pain    Pain Onset  More than a month ago         [facility] PT Assessment - 11/14/17 1503  Assessment   Medical Diagnosis  L cerebellar infarct    Referring Provider  Charlett Blake, MD    Onset Date/Surgical Date  04/07/17      Precautions   Precautions  Fall    Precaution Comments  HTN ASSESS VITALS AT [facility] SESSION; dizziness and diplopia      Prior Function   Level of Independence  Independent      Observation/Other Assessments   Focus on Therapeutic Outcomes (FOTO)   did not assess today    Neuro Quality of Life   did not assess today      Ambulation/Gait   Ambulation/Gait  Yes    Ambulation/Gait Assistance  4: Min assist    Ambulation Distance (Feet)  115 Feet    Assistive device  Straight cane    Ambulation Surface  Level;Indoor    Stairs  Yes    Stairs Assistance  4: Min assist    Stairs Assistance Details (indicate cue type and reason)  one rail with RUE    Stair  Management Technique  One rail Right;Step to pattern;Forwards    Number of Stairs  4    Height of Stairs  6      Standardized Balance Assessment   Standardized Balance Assessment  Dynamic Gait Index      Dynamic Gait Index   Level Surface  Moderate Impairment    Change in Gait Speed  Moderate Impairment    Gait with Horizontal Head Turns  Mild Impairment    Gait with Vertical Head Turns  Mild Impairment    Gait and Pivot Turn  Moderate Impairment    Step Over Obstacle  Moderate Impairment    Step Around Obstacles  Moderate Impairment    Steps  Moderate Impairment    Total Score  10    DGI comment:  10/24                  [facility] Adult PT Treatment/Exercise - 11/14/17 1503      Ambulation/Gait   Ambulation/Gait Assistance Details  Performed gait with cane over level surfaces and stepping over obstacle x 2 with verbal cues for sequencing stepping over obstacle more safely and verbal cues for increased step and stride length bilaterally instead of focusing on gait pattern with cane; pt demonstrated improved BOS width today and more fluid gait with cane    Gait Pattern  Step-through pattern;Decreased step length - right;Decreased step length - left;Ataxic;Decreased trunk rotation             PT Education - 11/14/17 1655    Education provided  Yes    Education Details  goal achievement; areas of continued focus    Person(s) Educated  Patient;Parent(s)    Methods  Explanation    Comprehension  Verbalized understanding       PT Short Term Goals - 10/17/17 1628      PT SHORT TERM GOAL #1   Title  Pt will participate in further assessment of falls risk during gait with DGI    Baseline  10/07/17: pt not safe at this time to test this, continue to assess when appropriate    Time  4    Period  Weeks    Status  Unable to assess      PT SHORT TERM GOAL #2   Title  Pt will decrease falls risk with gait as indicated by increase in gait velocity with LRAD to > or = 2.0  ft/sec  Baseline  1.78 ft/sec with RW    Time  4    Period  Weeks    Status  Not Met      PT SHORT TERM GOAL #3   Title  Pt will decrease falls risk as indicated by increase in BERG balance score to > or = 45/56    Baseline  10/07/17: 39/56 scored today, not to goal    Time  4    Period  Weeks    Status  Not Met      PT SHORT TERM GOAL #4   Title  Pt will ambulate >200' on indoor surfaces with cane (SPC with or without quad tip) and negotiate 4 stairs with one rail and cane, alternating sequence with min A     Baseline  partially met, felt that she needed mod cues and mod A for safe gait pattern. 10/17/17     Time  4    Period  Weeks    Status  Not Met        PT Long Term Goals - 11/14/17 1459      PT LONG TERM GOAL #1   Title  Pt and husband will demonstrate independence with HEP    Baseline  Pt reports not doing exercises on a consistent basis - 09-11-17    Time  8    Period  Weeks    Status  On-going      PT LONG TERM GOAL #2   Title  Pt will demonstrate improved balance and decreased falls risk as indicated by BERG balance score of > or = 45/56    Baseline  39/56 (taken week of 11/12)     34/56 taken 12/3       Time  8    Period  Weeks    Status  On-going      PT LONG TERM GOAL #3   Title  Pt will ambulate with cane x 300' over paved outdoor surfaces (curbs and inclines) with supervision; will negotiate 4 stairs with one rail and cane with alternating sequence with supervision    Baseline  negotiated 4 stairs with one rail, no cane step to sequence.  Ambulated 115' with cane indoors with min A    Time  8    Period  Weeks    Status  Not Met      PT LONG TERM GOAL #4   Title  Pt will report 15% improvement in Neuro QOL-LE    Baseline  31.3%    Time  8    Period  Weeks    Status  On-going      PT LONG TERM GOAL #5   Title  Pt will decrease falls risk during gait in home/community as indicated by increase in gait velocity to > or = 2.5 ft/sec with LRAD     Baseline  1.78 ft/sec with RW (taken week of 11/12)   1.55 ft/sec on 12/3    Time  8    Period  Weeks    Status  Not Met      PT LONG TERM GOAL #6   Title  Pt will demonstrate decreased falls risk with gait as indicated by increase in DGI score by 4 points    Baseline  10/24    Time  8    Period  Weeks    Status  Revised            Plan - 11/14/17 1656  Clinical Impression Statement  Completed assessment of LTG; pt demonstrated limited improvement on balance and gait assessments yesterday and inconsistent progress overall due to onset of RLE neuropathic pain that has progressively worsened as pt gains awareness and sensation.  Pt is making progress overall with balance, coordination, motor control/grading of movement, and gait.  Pt is able to ambulate with RW with supervision and has initiated gait training with and without cane over level surfaces with min-mod A; due to improved gait pt is now appropriate for assessment of gait with DGI. Pt is also now able to negotiate stairs with one rail and no AD in the other UE.  Pt would benefit from continued skilled PT services to address ongoing impairments in dizziness, impaired motor planning/sequencing and grading of movement, coordination, safety with mobility, balance and gait to maximize functional mobility independence and decrease falls risk.    Rehab Potential  Good    PT Frequency  2x / week    PT Duration  8 weeks    PT Treatment/Interventions  ADLs/Self Care Home Management;Aquatic Therapy;DME Instruction;Gait training;Stair training;Functional mobility training;Therapeutic activities;Therapeutic exercise;Balance training;Neuromuscular re-education;Patient/family education;Vestibular;Visual/perceptual remediation/compensation;Cognitive remediation;Orthotic Fit/Training    PT Next Visit Plan  ASSESS VITALS AT [facility] VISIT.  If weather permits: gait outside over pavement with cane, changes in direction around cones.  Physioball exercises  for postural control.      Consulted and Agree with Plan of Care  Patient;Family member/caregiver    Family Member Consulted  husband       Patient will benefit from skilled therapeutic intervention in order to improve the following deficits and impairments:  Abnormal gait, Decreased balance, Decreased cognition, Decreased coordination, Decreased strength, Difficulty walking, Dizziness, Impaired sensation, Impaired vision/preception, Pain, Decreased activity tolerance, Decreased mobility  Visit Diagnosis: Other lack of coordination  Unsteadiness on feet  Other abnormalities of gait and mobility  Ataxic gait  Dizziness and giddiness     Problem List Patient Active Problem List   Diagnosis Date Noted  . 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 Weakness with dizziness-  since [facility] and CVA 04/07/17 08/07/2017  . Disturbances of vision, late effect of stroke 08/06/2017  . Health education/counseling 08/05/2017  . High risk medications (not anticoagulants) long-term use 08/05/2017  . Neuritis-  R sided:  arm and leg/ body due to stroke 08/05/2017  . Elevated LDL cholesterol level 07/11/2017  . [employer] (NIH) Stroke Scale limb ataxia score 2, ataxia present in two limbs 06/25/2017  . 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 Elevated vitamin B12 level 05/30/2017  . Vitamin D deficiency 05/29/2017  . History of tobacco abuse-  30pk yr hx - quit 04/07/17 05/21/2017  . Gait disturbance, post-stroke 05/14/2017  . Benign essential HTN   . Vitreous hemorrhage of right eye ([place])   . Adjustment disorder with mixed anxiety and depressed mood   . 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 s/p SAH (subarachnoid hemorrhage) ([place]) 04/19/2017  . 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 Hypoxia   . Subarachnoid hemorrhage due to ruptured aneurysm ([place]) 04/07/2017  . CVA (cerebral vascular accident) ([place])  04/07/2017  . Elevated gastrin level 01/25/2012  . Hypokalemia 01/21/2012  . Nausea & vomiting 01/20/2012  . Epigastric pain 01/20/2012  . Duodenal ulcer, acute with obstruction 10/17/2011  . S/P laparoscopic cholecystectomy 10/16/2011    Rico Junker, PT, DPT 11/14/17    5:15 PM  [facility] [address] [place], [place], [address] Phone: [phone]   Fax:  [phone]  Name: [name] Barnett MRN: [id number] Date of Birth: 1976-01-26

## 2017-11-17 ENCOUNTER — Other Ambulatory Visit: Payer: Self-pay | Admitting: Family Medicine

## 2017-11-17 DIAGNOSIS — I10 Essential (primary) hypertension: Secondary | ICD-10-CM

## 2017-11-19 ENCOUNTER — Encounter: Payer: Self-pay | Admitting: Occupational Therapy

## 2017-11-19 ENCOUNTER — Encounter: Payer: Self-pay | Admitting: Physical Therapy

## 2017-11-19 ENCOUNTER — Ambulatory Visit: Payer: BLUE CROSS/BLUE SHIELD | Admitting: Physical Therapy

## 2017-11-19 ENCOUNTER — Ambulatory Visit: Payer: BLUE CROSS/BLUE SHIELD | Admitting: Occupational Therapy

## 2017-11-19 DIAGNOSIS — I69018 Other symptoms and signs involving cognitive functions following nontraumatic subarachnoid hemorrhage: Secondary | ICD-10-CM

## 2017-11-19 DIAGNOSIS — R2689 Other abnormalities of gait and mobility: Secondary | ICD-10-CM

## 2017-11-19 DIAGNOSIS — I69054 Hemiplegia and hemiparesis following nontraumatic subarachnoid hemorrhage affecting left non-dominant side: Secondary | ICD-10-CM

## 2017-11-19 DIAGNOSIS — R270 Ataxia, unspecified: Secondary | ICD-10-CM

## 2017-11-19 DIAGNOSIS — R414 Neurologic neglect syndrome: Secondary | ICD-10-CM

## 2017-11-19 DIAGNOSIS — R42 Dizziness and giddiness: Secondary | ICD-10-CM

## 2017-11-19 DIAGNOSIS — R2681 Unsteadiness on feet: Secondary | ICD-10-CM

## 2017-11-19 DIAGNOSIS — R278 Other lack of coordination: Secondary | ICD-10-CM

## 2017-11-19 DIAGNOSIS — R41840 Attention and concentration deficit: Secondary | ICD-10-CM

## 2017-11-19 DIAGNOSIS — R41842 Visuospatial deficit: Secondary | ICD-10-CM

## 2017-11-19 DIAGNOSIS — R208 Other disturbances of skin sensation: Secondary | ICD-10-CM

## 2017-11-19 NOTE — Therapy (Signed)
[facility] [address] [place], [place], [address] Phone: [phone]   Fax:  [phone]  Occupational Therapy Treatment  Patient Details  Name: [name] Barnett MRN: [id number] Date of Birth: [birth year] Referring Provider (Historical): Dr. Alysia Penna   Encounter Date: 11/19/2017  OT End of Session - 11/19/17 1410    Visit Number  29    Number of Visits  [phone]    Date for OT Re-Evaluation  01/13/18    Authorization Type  BC[employer], no visit limit/auth req.    Authorization Time Period  renewal completed 09/19/17 for 8 wks    OT Start Time  1406    OT Stop Time  1446    OT Time Calculation (min)  40 min    Activity Tolerance  Patient tolerated treatment well    Behavior During Therapy  Agitated       Past Medical History:  Diagnosis Date  . Duodenal obstruction   . GERD (gastroesophageal reflux disease)   . Hypertension   . Stroke [facility])    april 2018    Past Surgical History:  Procedure Laterality Date  . ABDOMINAL HYSTERECTOMY    . BALLOON DILATION  12/31/2011   Procedure: BALLOON DILATION;  Surgeon: Missy Sabins, MD;  Location: [facility] ENDOSCOPY;  Service: Endoscopy;  Laterality: N/A;  . CHOLECYSTECTOMY  07/28/11  . ESOPHAGOGASTRODUODENOSCOPY  10/17/2011   Procedure: ESOPHAGOGASTRODUODENOSCOPY (EGD);  Surgeon: Missy Sabins, MD;  Location: [facility] ENDOSCOPY;  Service: Endoscopy;  Laterality: N/A;  . IR ANGIO INTRA EXTRACRAN SEL INTERNAL CAROTID BILAT MOD SED  04/07/2017  . IR ANGIO VERTEBRAL SEL VERTEBRAL UNI R MOD SED  04/07/2017  . IR ANGIOGRAM FOLLOW UP STUDY  04/07/2017  . IR ANGIOGRAM FOLLOW UP STUDY  04/07/2017  . IR ANGIOGRAM FOLLOW UP STUDY  04/07/2017  . IR ANGIOGRAM FOLLOW UP STUDY  04/07/2017  . IR ANGIOGRAM FOLLOW UP STUDY  04/07/2017  . IR ANGIOGRAM SELECTIVE EACH ADDITIONAL VESSEL  04/07/2017  . IR TRANSCATH/EMBOLIZ  04/07/2017  . PARS PLANA VITRECTOMY Right 05/01/2017   Procedure: PARS PLANA VITRECTOMY  WITH 25 GAUGE RIGHT EYE, endolaser photocoaglation;  Surgeon: Jalene Mullet, MD;  Location: [place];  Service: Ophthalmology;  Laterality: Right;  . RADIOLOGY WITH ANESTHESIA N/A 04/07/2017   Procedure: RADIOLOGY WITH ANESTHESIA;  Surgeon: Consuella Lose, MD;  Location: [place];  Service: Radiology;  Laterality: N/A;  . REPLACEMENT TOTAL KNEE BILATERAL  2004  . TEMPOROMANDIBULAR JOINT SURGERY     2 surgeries  . TUMOR REMOVAL    . VAGOTOMY  01/23/2012   Procedure: VAGOTOMY, antrectomy and BII;  Surgeon: Haywood Lasso, MD;  Location: [place];  Service: General;  Laterality: N/A;  Laparotomy with vagotomy.    There were no vitals filed for this visit.  Subjective Assessment - 11/19/17 1409    Subjective   Pt reports that she cleaned floor (with feet), loaded/unloaded dishwasher, made mac and cheese, and wiped counter.  "I forgot how you told me to sweep"    Patient is accompained by:  Family member    Pertinent History  Subarachnoid hemorrhage due to ruptured aneurysm, ataxia (L cerebellar); HTN, R side decr sensation, vertical diplopia/visual deficits, ataxia, Bilateral retinal hemorhage associated with SAH (with surgery on R eye in hospital and L approx 1 month ago)    Limitations  visual deficits, fall risk, impulsivity/cognitive deficits    Patient Stated Goals  be able to walk and use L arm  Currently in Pain?  Yes    Pain Score  10-Worst pain ever    Pain Location  Groin    Pain Orientation  Right    Pain Descriptors / Indicators  Shooting    Pain Type  Neuropathic pain    Pain Onset  More than a month ago    Pain Frequency  Intermittent    Aggravating Factors   standing    Pain Relieving Factors  sitting, improved midline alignment         Pt made instant pudding with min cueing for RW placement/safety with RW.  Pt used LUE as gross assist with min cueing for strategies for incr stability.  Reviewed/practiced sweeping strategy per pt request (holding onto counter and  sweeping with 1 hand, assist for use of dustpan).  Recommended wet swifter instead of using wipes with feet.  In sitting, functional reaching with RUE to grasp/release cylinder objects with min drops.   Fastening buttons with bilateral UEs with min cueing to slow down and controlled/graded movement.                  OT Education - 11/19/17 1600    Education Details  safety/RW placement when in kitchen    Person(s) Educated  Patient    Methods  Explanation;Demonstration;Verbal cues    Comprehension  Verbalized understanding;Returned demonstration;Verbal cues required       OT Short Term Goals - 11/14/17 1732      OT SHORT TERM GOAL #1   Title  Pt/caregiver will be independent with initial HEP.    Time  4    Period  Weeks    Status  Achieved 08/19/17:  with cueing from husband      OT [place] #2   Title  Pt will be use LUE as nondominant assist at least 50% of the time for ADLs without cueing.    Time  4    Period  Weeks    Status  Achieved 08/19/17:  approx 40% of the time.  09/19/17:  90%      OT SHORT TERM GOAL #3   Title  Pt will improve LUE coordination/control to improve score on box and blocks test by at least 6 with LUE.    Baseline  19 blocks    Time  4    Period  Weeks    Status  Achieved 08/22/17:  18, 20 blocks.  09/19/17:  25 blocks      OT SHORT TERM GOAL #4   Title  Pt will perform simple home maintenance task/snack prep in sitting with set-up.    Time  4    Period  Weeks    Status  Achieved 08/19/17:  met in clinic with set-up and cueing/prompts for encouragement/to slow down      OT SHORT TERM GOAL #5   Title  Pt/husband will verbalize understanding of visual compensation stategies for improved safety/incr ease with ADLs.    Time  4    Period  Weeks    Status  Achieved      OT SHORT TERM GOAL #6   Title  Pt will perform simple environmental scanning/navigation with CGA.--check STGs 10/18/17    Time  4    Period  Weeks    Status   Achieved 10/16/17:  met at approx this level      OT SHORT TERM GOAL #7   Title  Pt will complete functional task with LUE with no more than  min cueing for impulsivity.--check STGs 12/14/16    Time  4    Period  Weeks    Status  On-going 10/16/17:  min-mod cueing; 11/14/17 inconsistent depending on task/frustration level    Target Date  12/14/17      OT SHORT TERM GOAL #8   Title  Pt will perform simple home maintenance task/snack prep in standing with set-up/supervision and no more than 1 v.c. for safety.    Time  4    Period  Weeks    Status  New    Target Date  12/14/17      OT SHORT TERM GOAL  #9   TITLE  Pt will improve coordination for ADLs as shown by completing 9-hole peg test in less than 41mn 30sec.    Baseline  257m 55.44sec    Time  4    Period  Weeks    Status  New    Target Date  12/14/17        OT Long Term Goals - 11/14/17 1734      OT LONG TERM GOAL #1   Title  Pt/caregiver will be independent with updated HEP.--check LTGs 01/13/18    Time  8    Period  Weeks    Status  On-going 11/14/17:  needs further updates    Target Date  01/13/18      OT LONG TERM GOAL #2   Title  Pt will be use LUE as nondominant assist at least 75% of the time for ADLs without cueing.    Time  8    Period  Weeks    Status  Achieved uses 75% x however needs prompting at times.  09/19/17:  met per pt  90%      OT LONG TERM GOAL #3   Title  Pt will perform toileting mod I (including transfer).    Time  8    Period  Weeks    Status  Achieved supervision.  09/19/17:  continues to need superivsion for mobility.  11/05/17  met      OT LONG TERM GOAL #4   Title  Pt will improve LUE coordination/control to improve score on box and blocks to at least 27 with LUE in 2 trials.--revised 11/14/17    Baseline  19 blocks    Time  8    Period  Weeks    Status  Revised 18, 19 blocks.  09/19/17:  25 blocks.  11/14/17:  19 blocks/inconsistent with frustration level      OT LONG TERM GOAL #5   Title   Pt will perform simple home maintenance task/snack prep in standing mod I.--updated 11/14/17    Time  8    Period  Weeks    Status  Revised Pt is not perfoming consistely, she has made a sandwich in seated.  11/14/17:  approximating, supervision/min A and cueing      OT LONG TERM GOAL #6   Title  Pt will perform simple environmental scanning/navigation with mod I.--revised 11/14/17    Time  8    Period  Weeks    Status  Revised 11/05/17  min cueing and supervision for mobility, environmental scanning with good accuracy      OT LONG TERM GOAL #7   Title  Pt will improve coordination for ADLs as shown by completing 9-hole peg test in less than 24m56m    Baseline  24mi29m.44sec    Time  8    Period  Weeks  Status  New            Plan - 11/19/17 1416    Clinical Impression Statement  Pt is progressing with incr IADL performance at home and improving balance and LUE functional use.  RLE pain is a barrier--appt with Dr. Letta Pate Friday to discuss.    Rehab Potential  Good    Current Impairments/barriers affecting progress:  cognitive deficits, impulsivity    OT Frequency  2x / week    OT Duration  8 weeks    OT Treatment/Interventions  Self-care/ADL training;Therapeutic exercise;DME and/or AE instruction;Therapeutic activities;Cognitive remediation/compensation;Visual/perceptual remediation/compensation;Passive range of motion;Functional Mobility Training;Neuromuscular education;Cryotherapy;Paraffin;Energy conservation;Manual Therapy;Patient/family education;Ultrasound;Balance training;Moist Heat;Fluidtherapy    Plan  continue with simple home maintenance tasks, LUE functional use    OT Home Exercise Plan  Education provided:  HEP for LUE control, coordination, and functional use    Recommended Other Services  Neuropsychological referral due to poor frustration tolerance, adjustment/coping    Consulted and Agree with Plan of Care  Patient;Family member/caregiver    Family Member Consulted   mother       Patient will benefit from skilled therapeutic intervention in order to improve the following deficits and impairments:  Decreased coordination, Decreased range of motion, Difficulty walking, Abnormal gait, Decreased safety awareness, Impaired sensation, Decreased knowledge of precautions, Decreased balance, Decreased knowledge of use of DME, Impaired UE functional use, Decreased cognition, Decreased mobility, Decreased strength, Impaired vision/preception, Impaired perceived functional ability  Visit Diagnosis: Other lack of coordination  Unsteadiness on feet  Other abnormalities of gait and mobility  Neurologic neglect syndrome  Other disturbances of skin sensation  Ataxia  Visuospatial deficit  Attention and concentration deficit  Other symptoms and signs involving cognitive functions following nontraumatic subarachnoid hemorrhage  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 Weakness with dizziness-  since [facility] and CVA 04/07/17 08/07/2017  . Disturbances of vision, late effect of stroke 08/06/2017  . Health education/counseling 08/05/2017  . High risk medications (not anticoagulants) long-term use 08/05/2017  . Neuritis-  R sided:  arm and leg/ body due to stroke 08/05/2017  . Elevated LDL cholesterol level 07/11/2017  . [employer] of Health (NIH) Stroke Scale limb ataxia score 2, ataxia present in two limbs 06/25/2017  . 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 Elevated vitamin B12 level 05/30/2017  . Vitamin D deficiency 05/29/2017  . History of tobacco abuse-  30pk yr hx - quit 04/07/17 05/21/2017  . Gait disturbance, post-stroke 05/14/2017  . Benign essential HTN   . Vitreous hemorrhage of right eye ([place])   . Adjustment disorder with mixed anxiety and  depressed mood   . 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 s/p SAH (subarachnoid hemorrhage) ([place]) 04/19/2017  . 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 Hypoxia   . Subarachnoid hemorrhage due to ruptured aneurysm ([place]) 04/07/2017  . CVA (cerebral vascular accident) ([place]) 04/07/2017  . Elevated gastrin level 01/25/2012  . Hypokalemia 01/21/2012  . Nausea & vomiting 01/20/2012  . Epigastric pain 01/20/2012  . Duodenal ulcer, acute with obstruction 10/17/2011  . S/P laparoscopic cholecystectomy 10/16/2011    [facility] 11/19/2017, 4:01 PM  [place] [address] [place], [place], [address] Phone: [phone]   Fax:  [phone]  Name: [name] Barnett MRN: [id number] Date of Birth: 05-27-1976   Vianne Bulls, OTR/L [facility] [address] [place], [place]  [address] [phone] phone [phone] 11/19/17 4:09 PM

## 2017-11-20 NOTE — Therapy (Signed)
[facility] [address] [place], [place], [address] Phone: [phone]   Fax:  [phone]  Physical Therapy Treatment  Patient Details  Name: [name] Barnett MRN: [id number] Date of Birth: November 04, 1976 Referring Provider: Erick Colace, MD   Encounter Date: 11/19/2017  PT End of Session - 11/19/17 1452    Visit Number  32    Number of Visits  46    Date for PT Re-Evaluation  01/13/18    Authorization Type  BCBS    PT Start Time  1449    PT Stop Time  1530    PT Time Calculation (min)  41 min    Activity Tolerance  Patient tolerated treatment well    Behavior During Therapy  [employer] for tasks assessed/performed       Past Medical History:  Diagnosis Date  . Duodenal obstruction   . GERD (gastroesophageal reflux disease)   . Hypertension   . Stroke [facility])    april 2018    Past Surgical History:  Procedure Laterality Date  . ABDOMINAL HYSTERECTOMY    . BALLOON DILATION  12/31/2011   Procedure: BALLOON DILATION;  Surgeon: Barrie Folk, MD;  Location: [facility] ENDOSCOPY;  Service: Endoscopy;  Laterality: N/A;  . CHOLECYSTECTOMY  07/28/11  . ESOPHAGOGASTRODUODENOSCOPY  10/17/2011   Procedure: ESOPHAGOGASTRODUODENOSCOPY (EGD);  Surgeon: Barrie Folk, MD;  Location: [facility] ENDOSCOPY;  Service: Endoscopy;  Laterality: N/A;  . IR ANGIO INTRA EXTRACRAN SEL INTERNAL CAROTID BILAT MOD SED  04/07/2017  . IR ANGIO VERTEBRAL SEL VERTEBRAL UNI R MOD SED  04/07/2017  . IR ANGIOGRAM FOLLOW UP STUDY  04/07/2017  . IR ANGIOGRAM FOLLOW UP STUDY  04/07/2017  . IR ANGIOGRAM FOLLOW UP STUDY  04/07/2017  . IR ANGIOGRAM FOLLOW UP STUDY  04/07/2017  . IR ANGIOGRAM FOLLOW UP STUDY  04/07/2017  . IR ANGIOGRAM SELECTIVE EACH ADDITIONAL VESSEL  04/07/2017  . IR TRANSCATH/EMBOLIZ  04/07/2017  . PARS PLANA VITRECTOMY Right 05/01/2017   Procedure: PARS PLANA VITRECTOMY WITH 25 GAUGE RIGHT EYE, endolaser photocoaglation;  Surgeon: Carmela Rima, MD;  [facility] OR;  Service: Ophthalmology;  Laterality: Right;  . RADIOLOGY WITH ANESTHESIA N/A 04/07/2017   Procedure: RADIOLOGY WITH ANESTHESIA;  Surgeon: Lisbeth Renshaw, MD;  Location: [facility] OR;  Service: Radiology;  Laterality: N/A;  . REPLACEMENT TOTAL KNEE BILATERAL  2004  . TEMPOROMANDIBULAR JOINT SURGERY     2 surgeries  . TUMOR REMOVAL    . VAGOTOMY  01/23/2012   Procedure: VAGOTOMY, antrectomy and BII;  Surgeon: Currie Paris, MD;  Location: [facility] OR;  Service: General;  Laterality: N/A;  Laparotomy with vagotomy.    There were no vitals filed for this visit.  Subjective Assessment - 11/19/17 1449    Subjective  Leg is hurting today, 10/10. Reports that Saturday was better. No falls to report. See's Dr. Wynn Banker on Friday.     Patient is accompained by:  Family member spouse    Limitations  Standing;Walking    Currently in Pain?  Yes    Pain Score  10-Worst pain ever    Pain Location  Leg    Pain Orientation  Right    Pain Descriptors / Indicators  Aching;Shooting    Pain Type  Neuropathic pain    Pain Radiating Towards  down thigh all the way to foot    Pain Onset  More than a month ago    Pain Frequency  Constant    Aggravating Factors  standing, weight bearing, walking    Pain Relieving Factors  sitting    Effect of Pain on Daily Activities  pt reports it's starting to interfere with her daily activities, making her not want to do as much          [facility]ercise - 11/19/17 1453      Neuro Re-ed    Neuro Re-ed Details   seated on green pball: small bouncing with emphasis on tall posture/abdominal bracing, pelvic rocks laterally, pelvic rocks ant/posterior direction. increased LE pain with pelvic rocks so only did 6-8 each and stopped due to pain. holding steady on ball with tall posture- EC no head movements for 30 sec;s x2, progressing to EC head movements left<>right, then up<>down, progressing to diagonals both ways. min guard to min assist for balance.   tall kneeling on blue mat: minisquats x 10 reps with emphasis on equal leg weight bearing/slow and controlled movements, progressing to walking fwd/bwd across the mat x 2 laps with min to mod assist for balance/tall posture, cues on weight shifitng to allow for leg to lift and advance. in quadruped: alternating leg outs x 10 reps each side, progressing to alternating contralateral side raises x 3 each side. min to mod assist for balance with cues for core/abdominal bracing. seated at edge of mat: staggered stance sit/stands with bil UE's holding a ball to prevent UE use. cues for weight shifting and to power up through LE's x 5 reps each foot forward. min assist for balance with standing. slight incr in LE pain that decreased with rest.                                          PT Short Term Goals - 11/14/17 1715      PT SHORT TERM GOAL #1   Title  Pt will improve DGI by 4 points from initial assessment    Baseline  10/24    Time  4    Period  Weeks    Status  Revised    Target Date  12/14/17      PT SHORT TERM GOAL #2   Title  Pt will decrease falls risk with gait as indicated by increase in gait velocity with LRAD to > or = 2.0 ft/sec    Baseline  1.55 ft/sec when experiencing pain in RLE    Time  4    Period  Weeks    Status  Revised    Target Date  12/14/17      PT SHORT TERM GOAL #3   Title  Pt will decrease falls risk as indicated by increase in BERG balance score to > or = 40/56    Baseline  34/56 when experiencing increased pain in RLE    Time  4    Period  Weeks    Status  Revised    Target Date  12/14/17      PT SHORT TERM GOAL #4   Title  Pt will ambulate >200' on indoor surfaces with cane (or no AD) and negotiate 4 stairs with one rail, alternating sequence with min A     Time  4    Period  Weeks    Status  Revised    Target Date  12/14/17        PT Long Term Goals - 11/14/17 1719  PT LONG TERM GOAL #1   Title  Pt and husband will demonstrate  independence with HEP    Time  8    Period  Weeks    Status  Revised    Target Date  01/13/18      PT LONG TERM GOAL #2   Title  Pt will demonstrate improved balance and decreased falls risk as indicated by BERG balance score of > or = 45/56    Baseline   34/56 taken 12/3       Time  8    Period  Weeks    Status  Revised    Target Date  01/13/18      PT LONG TERM GOAL #3   Title  Pt will ambulate with cane (or no AD) x 300' over outdoor paved surfaces with min A; will negotiate 4 stairs with one rail with alternating sequence and supervision    Time  8    Period  Weeks    Status  Revised    Target Date  01/13/18      PT LONG TERM GOAL #4   Title  Pt will report 15% improvement in Neuro QOL-LE    Baseline  31.3%    Time  8    Period  Weeks    Status  Revised    Target Date  01/13/18      PT LONG TERM GOAL #5   Title  Pt will decrease falls risk during gait in home/community as indicated by increase in gait velocity to > or = 2.5 ft/sec with LRAD    Baseline  1.55 ft/sec on 12/3    Time  8    Period  Weeks    Status  Revised    Target Date  01/13/18      PT LONG TERM GOAL #6   Title  Pt will demonstrate decreased falls risk with gait as indicated by increase in DGI score by 8 points from initial assessment    Baseline  10/24    Time  8    Period  Weeks    Status  Revised    Target Date  01/13/18            Plan - 11/19/17 1453    Clinical Impression Statement  Today's skilled session continued to address balance and strengthening as able, limited by increased LE neuropathic pain. Pt is planning to check with MD on Friday about the pain and options to help her with it. Pt is progressing toward goals and should benefit from continued PT to progress toward unmet goals.     Rehab Potential  Good    PT Frequency  2x / week    PT Duration  8 weeks    PT Treatment/Interventions  ADLs/Self Care Home Management;Aquatic Therapy;DME Instruction;Gait training;Stair  training;Functional mobility training;Therapeutic activities;Therapeutic exercise;Balance training;Neuromuscular re-education;Patient/family education;Vestibular;Visual/perceptual remediation/compensation;Cognitive remediation;Orthotic Fit/Training    PT Next Visit Plan  [facility]  If weather permits: gait outside over pavement with cane, changes in direction around cones.  Physioball exercises for postural control.      Consulted and Agree with Plan of Care  Patient;Family member/caregiver    Family Member Consulted  husband       Patient will benefit from skilled therapeutic intervention in order to improve the following deficits and impairments:  Abnormal gait, Decreased balance, Decreased cognition, Decreased coordination, Decreased strength, Difficulty walking, Dizziness, Impaired sensation, Impaired vision/preception, Pain, Decreased activity tolerance, Decreased mobility  Visit Diagnosis: Unsteadiness on feet  Dizziness and giddiness  Other lack of coordination     Problem List Patient Active Problem List   Diagnosis Date Noted  . 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 Weakness with dizziness-  since [facility] and CVA 04/07/17 08/07/2017  . Disturbances of vision, late effect of stroke 08/06/2017  . Health education/counseling 08/05/2017  . High risk medications (not anticoagulants) long-term use 08/05/2017  . Neuritis-  R sided:  arm and leg/ body due to stroke 08/05/2017  . Elevated LDL cholesterol level 07/11/2017  . [employer] of Health (NIH) Stroke Scale limb ataxia score 2, ataxia present in two limbs 06/25/2017  . 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 Elevated vitamin B12 level 05/30/2017  . Vitamin D deficiency 05/29/2017  . History of tobacco abuse-  30pk yr hx - quit 04/07/17 05/21/2017  . Gait disturbance, post-stroke 05/14/2017  . Benign essential HTN   . Vitreous hemorrhage of right eye ([name])   . Adjustment disorder with  mixed anxiety and depressed mood   . 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 s/p SAH (subarachnoid hemorrhage) ([name]) 04/19/2017  . Basilar artery aneurysm ([name])   . Hypoxia   . 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 CVA (cerebral vascular accident) ([name]) 04/07/2017  . Elevated gastrin level 01/25/2012  . Hypokalemia 01/21/2012  . Nausea & vomiting 01/20/2012  . Epigastric pain 01/20/2012  . Duodenal ulcer, acute with obstruction 10/17/2011  . S/P laparoscopic cholecystectomy 10/16/2011    [name], PTA, [facility] [address], [address] [place], [place] [address] [phone] 11/20/17, 10:57 AM   Name: [name] Barnett MRN: [id number] Date of Birth: 09-04-1976

## 2017-11-21 ENCOUNTER — Other Ambulatory Visit: Payer: Self-pay | Admitting: Family Medicine

## 2017-11-21 ENCOUNTER — Ambulatory Visit: Payer: BLUE CROSS/BLUE SHIELD | Admitting: Occupational Therapy

## 2017-11-21 DIAGNOSIS — I10 Essential (primary) hypertension: Secondary | ICD-10-CM

## 2017-11-22 ENCOUNTER — Ambulatory Visit: Payer: BLUE CROSS/BLUE SHIELD | Admitting: Physical Medicine & Rehabilitation

## 2017-11-22 ENCOUNTER — Encounter: Payer: Self-pay | Admitting: Physical Therapy

## 2017-11-22 ENCOUNTER — Ambulatory Visit: Payer: BLUE CROSS/BLUE SHIELD | Admitting: Physical Therapy

## 2017-11-22 ENCOUNTER — Encounter: Payer: BLUE CROSS/BLUE SHIELD | Attending: Physical Medicine & Rehabilitation

## 2017-11-22 ENCOUNTER — Encounter: Payer: Self-pay | Admitting: Physical Medicine & Rehabilitation

## 2017-11-22 VITALS — BP 142/65 | HR 78 | Resp 14

## 2017-11-22 DIAGNOSIS — I69398 Other sequelae of cerebral infarction: Secondary | ICD-10-CM

## 2017-11-22 DIAGNOSIS — R278 Other lack of coordination: Secondary | ICD-10-CM

## 2017-11-22 DIAGNOSIS — R42 Dizziness and giddiness: Secondary | ICD-10-CM | POA: Insufficient documentation

## 2017-11-22 DIAGNOSIS — R209 Unspecified disturbances of skin sensation: Secondary | ICD-10-CM | POA: Diagnosis not present

## 2017-11-22 DIAGNOSIS — R2689 Other abnormalities of gait and mobility: Secondary | ICD-10-CM

## 2017-11-22 DIAGNOSIS — I69093 Ataxia following nontraumatic subarachnoid hemorrhage: Secondary | ICD-10-CM | POA: Insufficient documentation

## 2017-11-22 DIAGNOSIS — R208 Other disturbances of skin sensation: Secondary | ICD-10-CM

## 2017-11-22 DIAGNOSIS — I69054 Hemiplegia and hemiparesis following nontraumatic subarachnoid hemorrhage affecting left non-dominant side: Secondary | ICD-10-CM | POA: Diagnosis not present

## 2017-11-22 DIAGNOSIS — I725 Aneurysm of other precerebral arteries: Secondary | ICD-10-CM | POA: Insufficient documentation

## 2017-11-22 DIAGNOSIS — R269 Unspecified abnormalities of gait and mobility: Secondary | ICD-10-CM | POA: Diagnosis not present

## 2017-11-22 DIAGNOSIS — R2681 Unsteadiness on feet: Secondary | ICD-10-CM

## 2017-11-22 NOTE — Therapy (Signed)
[facility] [address] [place], [place], [address] Phone: [phone]   Fax:  [phone]  Physical Therapy Treatment  Patient Details  Name: [name] Barnett MRN: [id number] Date of Birth: 05/21/1976 Referring Provider: Erick ColaceAndrew E Kirsteins, MD   Encounter Date: 11/22/2017  PT End of Session - 11/22/17 1640    Visit Number  33    Number of Visits  46    Date for PT Re-Evaluation  01/13/18    Authorization Type  BC[employer]    PT Start Time  1543 pt late due to MD appt before PT appt    PT Stop Time  1614    PT Time Calculation (min)  31 min    Equipment Utilized During Treatment  Gait belt    Activity Tolerance  Patient limited by pain    Behavior During Therapy  WFL for tasks assessed/performed       Past Medical History:  Diagnosis Date  . Duodenal obstruction   . GERD (gastroesophageal reflux disease)   . Hypertension   . Stroke [facility]([name])    april 2018    Past Surgical History:  Procedure Laterality Date  . ABDOMINAL HYSTERECTOMY    . BALLOON DILATION  12/31/2011   Procedure: BALLOON DILATION;  Surgeon: Barrie FolkJohn C Hayes, MD;  [facility];  Service: Endoscopy;  Laterality: N/A;  . CHOLECYSTECTOMY  07/28/11  . ESOPHAGOGASTRODUODENOSCOPY  10/17/2011   Procedure: ESOPHAGOGASTRODUODENOSCOPY (EGD);  Surgeon: Barrie FolkJohn C Hayes, MD;  [facility];  Service: Endoscopy;  Laterality: N/A;  . IR ANGIO INTRA EXTRACRAN SEL INTERNAL CAROTID BILAT MOD SED  04/07/2017  . IR ANGIO VERTEBRAL SEL VERTEBRAL UNI R MOD SED  04/07/2017  . IR ANGIOGRAM FOLLOW UP STUDY  04/07/2017  . IR ANGIOGRAM FOLLOW UP STUDY  04/07/2017  . IR ANGIOGRAM FOLLOW UP STUDY  04/07/2017  . IR ANGIOGRAM FOLLOW UP STUDY  04/07/2017  . IR ANGIOGRAM FOLLOW UP STUDY  04/07/2017  . IR ANGIOGRAM SELECTIVE EACH ADDITIONAL VESSEL  04/07/2017  . IR TRANSCATH/EMBOLIZ  04/07/2017  . PARS PLANA VITRECTOMY Right 05/01/2017   Procedure: PARS PLANA VITRECTOMY WITH 25 GAUGE  RIGHT EYE, endolaser photocoaglation;  Surgeon: Carmela RimaPatel, Narendra, MD;  [facility];  Service: Ophthalmology;  Laterality: Right;  . RADIOLOGY WITH ANESTHESIA N/A 04/07/2017   Procedure: RADIOLOGY WITH ANESTHESIA;  Surgeon: [name], MD;  [facility];  Service: Radiology;  Laterality: N/A;  . REPLACEMENT TOTAL KNEE BILATERAL  2004  . TEMPOROMANDIBULAR JOINT SURGERY     2 surgeries  . TUMOR REMOVAL    . VAGOTOMY  01/23/2012   Procedure: VAGOTOMY, antrectomy and BII;  Surgeon: [name], MD;  Location: [facility] OR;  Service: General;  Laterality: N/A;  Laparotomy with vagotomy.    There were no vitals filed for this visit.  Subjective Assessment - 11/22/17 1544    Subjective  Saw Dr. Wynn BankerKirsteins today who has increased her Lyrica dosage and scheduled a nerve block for 12/20/17. No falls to report. No change in pain, still 10/10.    Patient is accompained by:  Family member spouse    Limitations  Standing;Walking    Currently in Pain?  Yes    Pain Score  10-Worst pain ever    Pain Location  Leg    Pain Orientation  Right    Pain Descriptors / Indicators  Aching;Shooting    Pain Type  Neuropathic pain    Pain Onset  More than a month ago  Pain Frequency  Constant    Aggravating Factors   standing, weight bearing and walking    Pain Relieving Factors  sitting           [facility] Adult PT Treatment/Exercise - 11/22/17 1546      Transfers   Transfers  Sit to Stand;Stand to Sit    Sit to Stand  4: Min assist    Stand to Sit  4: Min assist    Number of Reps  10 reps;2 sets    Comments  in staggered stance x 10 with each foot forward. assistance needed for weight shifting, power up and controlled descent with sitting.       Neuro Re-ed    Neuro Re-ed Details   in parallel bars with 4 inch step: right foot on step lifting left foot up and then down with emphasis on knee control. then both feet on 4 inch step: stepping left foot down and back up with emphasis on right knee  control x 10 reps. bil UE support needed with min guard assist and cues on technique; in tall kneeling: mini squats x 5 then needed to stop due to increased right leg pain, in tall kneeling- alternating UE raises, progressing to alternating upper trunk reaching with hand reaching back behind her to target. cues to maintain tall posture, for weight shifting and assist needed for balance. in quadruped: alternating leg outs x 10 reps each side with assist to stabilize at pelvis and cues for abdominal bracing, progressed to contralateral UE/leg raises x 5 each side with mod assist at pelvis/trunk for balance and stability. ,                                     PT Short Term Goals - 11/14/17 1715      PT SHORT TERM GOAL #1   Title  Pt will improve DGI by 4 points from initial assessment    Baseline  10/24    Time  4    Period  Weeks    Status  Revised    Target Date  12/14/17      PT SHORT TERM GOAL #2   Title  Pt will decrease falls risk with gait as indicated by increase in gait velocity with LRAD to > or = 2.0 ft/sec    Baseline  1.55 ft/sec when experiencing pain in RLE    Time  4    Period  Weeks    Status  Revised    Target Date  12/14/17      PT SHORT TERM GOAL #3   Title  Pt will decrease falls risk as indicated by increase in BERG balance score to > or = 40/56    Baseline  34/56 when experiencing increased pain in RLE    Time  4    Period  Weeks    Status  Revised    Target Date  12/14/17      PT SHORT TERM GOAL #4   Title  Pt will ambulate >200' on indoor surfaces with cane (or no AD) and negotiate 4 stairs with one rail, alternating sequence with min A     Time  4    Period  Weeks    Status  Revised    Target Date  12/14/17        PT Long Term Goals - 11/14/17 1719  PT LONG TERM GOAL #1   Title  Pt and husband will demonstrate independence with HEP    Time  8    Period  Weeks    Status  Revised    Target Date  01/13/18      PT LONG TERM GOAL #2   Title   Pt will demonstrate improved balance and decreased falls risk as indicated by BERG balance score of > or = 45/56    Baseline   34/56 taken 12/3       Time  8    Period  Weeks    Status  Revised    Target Date  01/13/18      PT LONG TERM GOAL #3   Title  Pt will ambulate with cane (or no AD) x 300' over outdoor paved surfaces with min A; will negotiate 4 stairs with one rail with alternating sequence and supervision    Time  8    Period  Weeks    Status  Revised    Target Date  01/13/18      PT LONG TERM GOAL #4   Title  Pt will report 15% improvement in Neuro QOL-LE    Baseline  31.3%    Time  8    Period  Weeks    Status  Revised    Target Date  01/13/18      PT LONG TERM GOAL #5   Title  Pt will decrease falls risk during gait in home/community as indicated by increase in gait velocity to > or = 2.5 ft/sec with LRAD    Baseline  1.55 ft/sec on 12/3    Time  8    Period  Weeks    Status  Revised    Target Date  01/13/18      PT LONG TERM GOAL #6   Title  Pt will demonstrate decreased falls risk with gait as indicated by increase in DGI score by 8 points from initial assessment    Baseline  10/24    Time  8    Period  Weeks    Status  Revised    Target Date  01/13/18         Plan - 11/22/17 1642    Clinical Impression Statement  Today's skilled session continued to address strengthening and balance. Pt continued to be limited by right LE neuropathic pain. Pt is making slow, steady progress toward goals despite this and should benefit from continued PT to progress toward unmet goals.     Rehab Potential  Good    PT Frequency  2x / week    PT Duration  8 weeks    PT Treatment/Interventions  ADLs/Self Care Home Management;Aquatic Therapy;DME Instruction;Gait training;Stair training;Functional mobility training;Therapeutic activities;Therapeutic exercise;Balance training;Neuromuscular re-education;Patient/family education;Vestibular;Visual/perceptual  remediation/compensation;Cognitive remediation;Orthotic Fit/Training    PT Next Visit Plan  ASSESS VITALS AT [facility] VISIT.  If weather permits: gait outside over pavement with cane, changes in direction around cones.  Physioball exercises for postural control.      Consulted and Agree with Plan of Care  Patient;Family member/caregiver    Family Member Consulted  husband       Patient will benefit from skilled therapeutic intervention in order to improve the following deficits and impairments:  Abnormal gait, Decreased balance, Decreased cognition, Decreased coordination, Decreased strength, Difficulty walking, Dizziness, Impaired sensation, Impaired vision/preception, Pain, Decreased activity tolerance, Decreased mobility  Visit Diagnosis: Unsteadiness on feet  Other lack of coordination  Other disturbances of  skin sensation  Other abnormalities of gait and mobility     Problem List Patient Active Problem List   Diagnosis Date Noted  . 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 Weakness with dizziness-  [facility] 04/07/17 08/07/2017  . Disturbances of vision, late effect of stroke 08/06/2017  . Health education/counseling 08/05/2017  . High risk medications (not anticoagulants) long-term use 08/05/2017  . Neuritis-  R sided:  arm and leg/ body due to stroke 08/05/2017  . Elevated LDL cholesterol level 07/11/2017  . [employer] (NIH) Stroke Scale limb ataxia score 2, ataxia present in two limbs 06/25/2017  . 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 Elevated vitamin B12 level 05/30/2017  . Vitamin D deficiency 05/29/2017  . History of tobacco abuse-  30pk yr hx - quit 04/07/17 05/21/2017  . Gait disturbance, post-stroke 05/14/2017  . Benign essential HTN   . Vitreous hemorrhage of right eye ([name])   . Adjustment disorder with mixed anxiety and depressed mood   . 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 Terson syndrome of both  eyes ([name]) 04/23/2017  . s/p SAH (subarachnoid hemorrhage) ([name]) 04/19/2017  . Basilar artery aneurysm ([name])   . Hypoxia   . 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 CVA (cerebral vascular accident) ([name]) 04/07/2017  . Elevated gastrin level 01/25/2012  . Hypokalemia 01/21/2012  . Nausea & vomiting 01/20/2012  . Epigastric pain 01/20/2012  . Duodenal ulcer, acute with obstruction 10/17/2011  . S/P laparoscopic cholecystectomy 10/16/2011    Sallyanne KusterKathy Maeola Mchaney, PTA, [facility] [address], [address] [place], [place] [address] [phone] 11/22/17, 4:44 PM   Name: [name] Barnett MRN: [id number] Date of Birth: 10/31/1976

## 2017-11-22 NOTE — Progress Notes (Signed)
Subjective:    Patient ID: [name] Barnett, female    DOB: 02/18/1976, 41 y.o.   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 HPI Using cane in therapy, walker at home No falls, more active, cooking ,loading dishes Cannot sweep  Right groin pain radiating to thigh Had femoral sheath placed with pain  Pain Inventory Average Pain 10 Pain Right Now 10 My pain is sharp, stabbing and aching  In the last 24 hours, has pain interfered with the following? General activity 5 Relation with others 5 Enjoyment of life 9 What TIME of day is your pain at its worst? all Sleep (in general) NA  Pain is worse with: walking, bending, sitting, inactivity, standing and some activites Pain improves with: nothing Relief from Meds: 0  Mobility walk with assistance use a cane use a walker ability to climb steps?  yes do you drive?  no Do you have any goals in this area?  yes  Function not employed: date last employed .  Neuro/Psych bladder control problems  Prior Studies Any changes since last visit?  no  Physicians involved in your care Any changes since last visit?  no   Family History  Problem Relation Age of Onset  . Hypertension Mother   . Restless legs syndrome Mother   . Hyperlipidemia Mother   . Heart disease Father   . Malignant hyperthermia Neg Hx    Social History   Socioeconomic History  . Marital status: Married    Spouse name: None  . Number of children: None  . Years of education: None  . Highest education level: None  Social Needs  . Financial resource strain: None  . Food insecurity - worry: None  . Food insecurity - inability: None  . Transportation needs - medical: None  . Transportation needs - non-medical: None  Occupational History  . None  Tobacco Use  . Smoking status: Former Smoker    Packs/day: 1.00    Years: 20.00    Pack years: 20.00    Types: Cigarettes    Last attempt to quit: 04/07/2017    Years since quitting: 0.6  . Smokeless tobacco: Never Used  Substance and Sexual Activity  . Alcohol use: No  . Drug use: No  . Sexual activity: Not Currently    Partners: Male  Other Topics Concern  . None  Social History Narrative   [place] 2 kids. 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   Past Surgical History:  Procedure Laterality Date  . ABDOMINAL HYSTERECTOMY    . BALLOON DILATION  12/31/2011   Procedure: BALLOON DILATION;  Surgeon: Barrie FolkJohn C Hayes, MD;  [facility];  Service: Endoscopy;  Laterality: N/A;  . CHOLECYSTECTOMY  07/28/11  . ESOPHAGOGASTRODUODENOSCOPY  10/17/2011   Procedure: ESOPHAGOGASTRODUODENOSCOPY (EGD);  Surgeon: Barrie FolkJohn C Hayes, MD;  [facility];  Service: Endoscopy;  Laterality: N/A;  . IR ANGIO INTRA EXTRACRAN SEL INTERNAL CAROTID BILAT MOD SED  04/07/2017  . IR ANGIO VERTEBRAL SEL VERTEBRAL UNI R MOD SED  04/07/2017  . IR ANGIOGRAM FOLLOW UP STUDY  04/07/2017  . IR ANGIOGRAM FOLLOW UP STUDY  04/07/2017  . IR ANGIOGRAM FOLLOW UP STUDY  04/07/2017  . IR ANGIOGRAM FOLLOW UP STUDY  04/07/2017  . IR ANGIOGRAM FOLLOW UP STUDY  04/07/2017  . IR ANGIOGRAM SELECTIVE EACH ADDITIONAL VESSEL  04/07/2017  .  IR TRANSCATH/EMBOLIZ  04/07/2017  . PARS PLANA VITRECTOMY Right 05/01/2017   Procedure: PARS PLANA VITRECTOMY WITH 25 GAUGE RIGHT EYE, endolaser photocoaglation;  Surgeon: Carmela RimaPatel, Narendra, MD;  [facility];  Service: Ophthalmology;  Laterality: Right;  . RADIOLOGY WITH ANESTHESIA N/A 04/07/2017   Procedure: RADIOLOGY WITH ANESTHESIA;  Surgeon: [name], MD;  [facility];  Service: Radiology;  Laterality: N/A;  . REPLACEMENT TOTAL KNEE BILATERAL  2004  . TEMPOROMANDIBULAR JOINT SURGERY     2 surgeries  . TUMOR REMOVAL    . VAGOTOMY  01/23/2012   Procedure: VAGOTOMY, antrectomy and BII;  Surgeon: [name], MD;  Location: [facility]  OR;  Service: General;  Laterality: N/A;  Laparotomy with vagotomy.   Past Medical History:  Diagnosis Date  . Duodenal obstruction   . GERD (gastroesophageal reflux disease)   . Hypertension   . Stroke ([name])    april 2018   BP (!) 142/65   Pulse 78   Resp 14   SpO2 93%   Opioid Risk Score:   Fall Risk Score:  `1  Depression screen PHQ 2/9  [facility] 08/01/2017 07/11/2017 05/14/2017  Decreased Interest 0 2 3  Down, Depressed, Hopeless 0 1 2  PHQ - 2 Score 0 3 5  Altered sleeping 0 0 3  Tired, decreased energy 1 3 3   Change in appetite 0 0 1  Feeling bad or failure about yourself  0 0 1  Trouble concentrating 0 1 2  Moving slowly or fidgety/restless 0 3 2  Suicidal thoughts 0 1 0  PHQ-9 Score 1 11 17   Difficult doing work/chores - - Very difficult    Review of Systems     Objective:   Physical Exam  Constitutional: [name] Barnett is oriented to person, place, and time. [name] Barnett appears well-developed and well-nourished.  HENT:  Head: Normocephalic and atraumatic.  Eyes: Conjunctivae and EOM are normal. Pupils are equal, round, and reactive to light.  Neurological: [name] Barnett is alert and oriented to person, place, and time. Coordination abnormal.  Ataxia in the right side Sensation reduced right anterior thigh with some dysesthetic pain as well.  Psychiatric: [name] Barnett has a normal mood and affect.  Nursing note and vitals reviewed.   Tenderness over the right femoral artery Gait is using a walker      Assessment & Plan:  #1.  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  Increase Lyrica to 75 mg 3 times daily for 1 week and if no improvement increase to 150 twice daily after that. Scheduled for ultrasound-guided femoral nerve block

## 2017-11-22 NOTE — Patient Instructions (Signed)
Increase Lyrica to 3 tablets per day for one week Increase Lyrica to 4 tablets per day if not effective

## 2017-11-26 ENCOUNTER — Ambulatory Visit: Payer: BLUE CROSS/BLUE SHIELD | Admitting: Occupational Therapy

## 2017-11-26 ENCOUNTER — Ambulatory Visit: Payer: BLUE CROSS/BLUE SHIELD | Admitting: Physical Therapy

## 2017-11-28 ENCOUNTER — Ambulatory Visit: Payer: BLUE CROSS/BLUE SHIELD | Admitting: Occupational Therapy

## 2017-11-28 ENCOUNTER — Encounter: Payer: Self-pay | Admitting: Occupational Therapy

## 2017-11-28 ENCOUNTER — Encounter: Payer: Self-pay | Admitting: Physical Therapy

## 2017-11-28 ENCOUNTER — Ambulatory Visit: Payer: BLUE CROSS/BLUE SHIELD | Admitting: Physical Therapy

## 2017-11-28 DIAGNOSIS — R260 Ataxic gait: Secondary | ICD-10-CM

## 2017-11-28 DIAGNOSIS — I69018 Other symptoms and signs involving cognitive functions following nontraumatic subarachnoid hemorrhage: Secondary | ICD-10-CM

## 2017-11-28 DIAGNOSIS — R2689 Other abnormalities of gait and mobility: Secondary | ICD-10-CM

## 2017-11-28 DIAGNOSIS — R278 Other lack of coordination: Secondary | ICD-10-CM

## 2017-11-28 DIAGNOSIS — I69054 Hemiplegia and hemiparesis following nontraumatic subarachnoid hemorrhage affecting left non-dominant side: Secondary | ICD-10-CM | POA: Diagnosis not present

## 2017-11-28 DIAGNOSIS — R41842 Visuospatial deficit: Secondary | ICD-10-CM

## 2017-11-28 DIAGNOSIS — R2681 Unsteadiness on feet: Secondary | ICD-10-CM

## 2017-11-28 DIAGNOSIS — R414 Neurologic neglect syndrome: Secondary | ICD-10-CM

## 2017-11-28 DIAGNOSIS — R208 Other disturbances of skin sensation: Secondary | ICD-10-CM

## 2017-11-28 DIAGNOSIS — R270 Ataxia, unspecified: Secondary | ICD-10-CM

## 2017-11-28 DIAGNOSIS — R42 Dizziness and giddiness: Secondary | ICD-10-CM

## 2017-11-28 DIAGNOSIS — R41840 Attention and concentration deficit: Secondary | ICD-10-CM

## 2017-11-28 NOTE — Therapy (Signed)
[place] [address] [place], [place], [address] Phone: [phone]   Fax:  [phone]  Occupational Therapy Treatment  Patient Details  Name: [name] Barnett MRN: [id number] Date of Birth: 19-Feb-1976 Referring Provider (Historical): Dr. Alysia Penna   Encounter Date: 11/28/2017  OT End of Session - 11/28/17 1740    Visit Number  30    Number of Visits  [phone]    Date for OT Re-Evaluation  01/13/18    Authorization Type  BC[employer], no visit limit/auth req.    Authorization Time Period  renewal completed 11/14/17 for 8 wks    OT Start Time  1450    OT Stop Time  1530    OT Time Calculation (min)  40 min    Activity Tolerance  Patient tolerated treatment well    Behavior During Therapy  [facility] for tasks assessed/performed;Impulsive       Past Medical History:  Diagnosis Date  . Duodenal obstruction   . GERD (gastroesophageal reflux disease)   . Hypertension   . Stroke [facility])    april 2018    Past Surgical History:  Procedure Laterality Date  . ABDOMINAL HYSTERECTOMY    . BALLOON DILATION  12/31/2011   Procedure: BALLOON DILATION;  Surgeon: Missy Sabins, MD;  Location: [facility] ENDOSCOPY;  Service: Endoscopy;  Laterality: N/A;  . CHOLECYSTECTOMY  07/28/11  . ESOPHAGOGASTRODUODENOSCOPY  10/17/2011   Procedure: ESOPHAGOGASTRODUODENOSCOPY (EGD);  Surgeon: Missy Sabins, MD;  Location[facility] ENDOSCOPY;  Service: Endoscopy;  Laterality: N/A;  . IR ANGIO INTRA EXTRACRAN SEL INTERNAL CAROTID BILAT MOD SED  04/07/2017  . IR ANGIO VERTEBRAL SEL VERTEBRAL UNI R MOD SED  04/07/2017  . IR ANGIOGRAM FOLLOW UP STUDY  04/07/2017  . IR ANGIOGRAM FOLLOW UP STUDY  04/07/2017  . IR ANGIOGRAM FOLLOW UP STUDY  04/07/2017  . IR ANGIOGRAM FOLLOW UP STUDY  04/07/2017  . IR ANGIOGRAM FOLLOW UP STUDY  04/07/2017  . IR ANGIOGRAM SELECTIVE EACH ADDITIONAL VESSEL  04/07/2017  . IR TRANSCATH/EMBOLIZ  04/07/2017  . PARS PLANA VITRECTOMY Right 05/01/2017    Procedure: PARS PLANA VITRECTOMY WITH 25 GAUGE RIGHT EYE, endolaser photocoaglation;  Surgeon: Jalene Mullet, MD;  Location: [place];  Service: Ophthalmology;  Laterality: Right;  . RADIOLOGY WITH ANESTHESIA N/A 04/07/2017   Procedure: RADIOLOGY WITH ANESTHESIA;  Surgeon: Consuella Lose, MD;  Location: [place];  Service: Radiology;  Laterality: N/A;  . REPLACEMENT TOTAL KNEE BILATERAL  2004  . TEMPOROMANDIBULAR JOINT SURGERY     2 surgeries  . TUMOR REMOVAL    . VAGOTOMY  01/23/2012   Procedure: VAGOTOMY, antrectomy and BII;  Surgeon: Haywood Lasso, MD;  Location: [place];  Service: General;  Laterality: N/A;  Laparotomy with vagotomy.    There were no vitals filed for this visit.  Subjective Assessment - 11/28/17 1452    Subjective   made banana pudding independently using LUE as assist    Patient is accompained by:  Family member    Pertinent History  Subarachnoid hemorrhage due to ruptured aneurysm, ataxia (L cerebellar); HTN, R side decr sensation, vertical diplopia/visual deficits, ataxia, Bilateral retinal hemorhage associated with SAH (with surgery on R eye in hospital and L approx 1 month ago)    Limitations  visual deficits, fall risk, impulsivity/cognitive deficits    Patient Stated Goals  be able to walk and use L arm    Currently in Pain?  Yes    Pain Score  7  Pain Location  Leg    Pain Orientation  Right    Pain Descriptors / Indicators  Aching;Shooting    Pain Type  Neuropathic pain    Pain Onset  More than a month ago    Pain Frequency  Constant    Aggravating Factors   standing, walking, weight bearing    Pain Relieving Factors  sitting        Played connect 4 with therapist for cognition, visual scanning, and LUE functional use (putting checkers in slots with LUE).  Pt with min cueing for impulsivity with LUE use and using 2point pinch, min-occasional mod difficulty.  Mod cueing for midline alignment in sitting  Played Spot It with LUE for incr functional  use, attention, and visual scanning min cueing for impulsivity for incr LUE control, midline alignment in sitting.  (unfamiliar game)  Played dominos for LUE functional use, attention, and visual scanning with min cueing for impulsivity/LUE control/use.  Unfamiliar game with good attention to rules/task.                   OT Short Term Goals - 11/14/17 1732      OT SHORT TERM GOAL #1   Title  Pt/caregiver will be independent with initial HEP.    Time  4    Period  Weeks    Status  Achieved 08/19/17:  with cueing from husband      OT [place] #2   Title  Pt will be use LUE as nondominant assist at least 50% of the time for ADLs without cueing.    Time  4    Period  Weeks    Status  Achieved 08/19/17:  approx 40% of the time.  09/19/17:  90%      OT SHORT TERM GOAL #3   Title  Pt will improve LUE coordination/control to improve score on box and blocks test by at least 6 with LUE.    Baseline  19 blocks    Time  4    Period  Weeks    Status  Achieved 08/22/17:  18, 20 blocks.  09/19/17:  25 blocks      OT SHORT TERM GOAL #4   Title  Pt will perform simple home maintenance task/snack prep in sitting with set-up.    Time  4    Period  Weeks    Status  Achieved 08/19/17:  met in clinic with set-up and cueing/prompts for encouragement/to slow down      OT SHORT TERM GOAL #5   Title  Pt/husband will verbalize understanding of visual compensation stategies for improved safety/incr ease with ADLs.    Time  4    Period  Weeks    Status  Achieved      OT SHORT TERM GOAL #6   Title  Pt will perform simple environmental scanning/navigation with CGA.--check STGs 10/18/17    Time  4    Period  Weeks    Status  Achieved 10/16/17:  met at approx this level      OT SHORT TERM GOAL #7   Title  Pt will complete functional task with LUE with no more than min cueing for impulsivity.--check STGs 12/14/16    Time  4    Period  Weeks    Status  On-going 10/16/17:  min-mod cueing;  11/14/17 inconsistent depending on task/frustration level    Target Date  12/14/17      OT SHORT TERM GOAL #8  Title  Pt will perform simple home maintenance task/snack prep in standing with set-up/supervision and no more than 1 v.c. for safety.    Time  4    Period  Weeks    Status  New    Target Date  12/14/17      OT SHORT TERM GOAL  #9   TITLE  Pt will improve coordination for ADLs as shown by completing 9-hole peg test in less than 9mn 30sec.    Baseline  247m 55.44sec    Time  4    Period  Weeks    Status  New    Target Date  12/14/17        OT Long Term Goals - 11/14/17 1734      OT LONG TERM GOAL #1   Title  Pt/caregiver will be independent with updated HEP.--check LTGs 01/13/18    Time  8    Period  Weeks    Status  On-going 11/14/17:  needs further updates    Target Date  01/13/18      OT LONG TERM GOAL #2   Title  Pt will be use LUE as nondominant assist at least 75% of the time for ADLs without cueing.    Time  8    Period  Weeks    Status  Achieved uses 75% x however needs prompting at times.  09/19/17:  met per pt  90%      OT LONG TERM GOAL #3   Title  Pt will perform toileting mod I (including transfer).    Time  8    Period  Weeks    Status  Achieved supervision.  09/19/17:  continues to need superivsion for mobility.  11/05/17  met      OT LONG TERM GOAL #4   Title  Pt will improve LUE coordination/control to improve score on box and blocks to at least 27 with LUE in 2 trials.--revised 11/14/17    Baseline  19 blocks    Time  8    Period  Weeks    Status  Revised 18, 19 blocks.  09/19/17:  25 blocks.  11/14/17:  19 blocks/inconsistent with frustration level      OT LONG TERM GOAL #5   Title  Pt will perform simple home maintenance task/snack prep in standing mod I.--updated 11/14/17    Time  8    Period  Weeks    Status  Revised Pt is not perfoming consistely, she has made a sandwich in seated.  11/14/17:  approximating, supervision/min A and cueing       OT LONG TERM GOAL #6   Title  Pt will perform simple environmental scanning/navigation with mod I.--revised 11/14/17    Time  8    Period  Weeks    Status  Revised 11/05/17  min cueing and supervision for mobility, environmental scanning with good accuracy      OT LONG TERM GOAL #7   Title  Pt will improve coordination for ADLs as shown by completing 9-hole peg test in less than 62m462m    Baseline  62mi49m.44sec    Time  8    Period  Weeks    Status  New            Plan - 11/28/17 1742    Clinical Impression Statement  Pt demo improved attention to task and LUE control today with games/competition.      Rehab Potential  Good    Current  Impairments/barriers affecting progress:  cognitive deficits, impulsivity    OT Frequency  2x / week    OT Duration  8 weeks    OT Treatment/Interventions  Self-care/ADL training;Therapeutic exercise;DME and/or AE instruction;Therapeutic activities;Cognitive remediation/compensation;Visual/perceptual remediation/compensation;Passive range of motion;Functional Mobility Training;Neuromuscular education;Cryotherapy;Paraffin;Energy conservation;Manual Therapy;Patient/family education;Ultrasound;Balance training;Moist Heat;Fluidtherapy    Plan  continue with simple home maintenance tasks, LUE functional use    OT Home Exercise Plan  Education provided:  HEP for LUE control, coordination, and functional use    Recommended Other Services  Neuropsychological referral due to poor frustration tolerance, adjustment/coping    Consulted and Agree with Plan of Care  Patient;Family member/caregiver    Family Member Consulted  mother       Patient will benefit from skilled therapeutic intervention in order to improve the following deficits and impairments:  Decreased coordination, Decreased range of motion, Difficulty walking, Abnormal gait, Decreased safety awareness, Impaired sensation, Decreased knowledge of precautions, Decreased balance, Decreased knowledge  of use of DME, Impaired UE functional use, Decreased cognition, Decreased mobility, Decreased strength, Impaired vision/preception, Impaired perceived functional ability  Visit Diagnosis: Other lack of coordination  Attention and concentration deficit  Visuospatial deficit  Other abnormalities of gait and mobility  Other symptoms and signs involving cognitive functions following nontraumatic subarachnoid hemorrhage  Unsteadiness on feet  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 Weakness with dizziness-  since [facility] and CVA 04/07/17 08/07/2017  . Disturbances of vision, late effect of stroke 08/06/2017  . Health education/counseling 08/05/2017  . High risk medications (not anticoagulants) long-term use 08/05/2017  . Neuritis-  R sided:  arm and leg/ body due to stroke 08/05/2017  . Elevated LDL cholesterol level 07/11/2017  . [employer] of Health (NIH) Stroke Scale limb ataxia score 2, ataxia present in two limbs 06/25/2017  . 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 Elevated vitamin B12 level 05/30/2017  . Vitamin D deficiency 05/29/2017  . History of tobacco abuse-  30pk yr hx - quit 04/07/17 05/21/2017  . Gait disturbance, post-stroke 05/14/2017  . Benign essential HTN   . Vitreous hemorrhage of right eye ([place])   . Adjustment disorder with mixed anxiety and depressed mood   . 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 s/p SAH (subarachnoid hemorrhage) ([place]) 04/19/2017  . 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 Hypoxia   . Subarachnoid hemorrhage due to ruptured aneurysm ([place]) 04/07/2017  . CVA (cerebral vascular accident) ([place])  04/07/2017  . Elevated gastrin level 01/25/2012  . Hypokalemia 01/21/2012  . Nausea & vomiting 01/20/2012  . Epigastric pain 01/20/2012  . Duodenal ulcer, acute with obstruction 10/17/2011  . S/P laparoscopic cholecystectomy 10/16/2011    [facility] 11/28/2017, 5:43 PM  [place] [address] [place], [place], [address] Phone: [phone]   Fax:  [phone]  Name: [name] Barnett MRN: [id number] Date of Birth: 04-02-1976   Vianne Bulls, OTR/L [facility] [address] [place], [place]  [address] [phone] phone [phone] 11/28/17 5:43 PM

## 2017-11-28 NOTE — Therapy (Signed)
[facility] [address] [place], [place], [address] Phone: [phone]   Fax:  [phone]  Physical Therapy Treatment  Patient Details  Name: [name] Barnett MRN: [id number] Date of Birth: 06/23/1976 Referring Provider: Erick ColaceAndrew E Kirsteins, MD   Encounter Date: 11/28/2017  PT End of Session - 11/28/17 1640    Visit Number  34    Number of Visits  46    Date for PT Re-Evaluation  01/13/18    Authorization Type  BCBS    PT Start Time  1530    PT Stop Time  1615    PT Time Calculation (min)  45 min    Activity Tolerance  Patient limited by pain    Behavior During Therapy  [facility] for tasks assessed/performed       Past Medical History:  Diagnosis Date  . Duodenal obstruction   . GERD (gastroesophageal reflux disease)   . Hypertension   . Stroke [facility]([name])    april 2018    Past Surgical History:  Procedure Laterality Date  . ABDOMINAL HYSTERECTOMY    . BALLOON DILATION  12/31/2011   Procedure: BALLOON DILATION;  Surgeon: Barrie FolkJohn C Hayes, MD;  [facility];  Service: Endoscopy;  Laterality: N/A;  . CHOLECYSTECTOMY  07/28/11  . ESOPHAGOGASTRODUODENOSCOPY  10/17/2011   Procedure: ESOPHAGOGASTRODUODENOSCOPY (EGD);  Surgeon: Barrie FolkJohn C Hayes, MD;  [facility];  Service: Endoscopy;  Laterality: N/A;  . IR ANGIO INTRA EXTRACRAN SEL INTERNAL CAROTID BILAT MOD SED  04/07/2017  . IR ANGIO VERTEBRAL SEL VERTEBRAL UNI R MOD SED  04/07/2017  . IR ANGIOGRAM FOLLOW UP STUDY  04/07/2017  . IR ANGIOGRAM FOLLOW UP STUDY  04/07/2017  . IR ANGIOGRAM FOLLOW UP STUDY  04/07/2017  . IR ANGIOGRAM FOLLOW UP STUDY  04/07/2017  . IR ANGIOGRAM FOLLOW UP STUDY  04/07/2017  . IR ANGIOGRAM SELECTIVE EACH ADDITIONAL VESSEL  04/07/2017  . IR TRANSCATH/EMBOLIZ  04/07/2017  . PARS PLANA VITRECTOMY Right 05/01/2017   Procedure: PARS PLANA VITRECTOMY WITH 25 GAUGE RIGHT EYE, endolaser photocoaglation;  Surgeon: Carmela RimaPatel, Narendra, MD;  [facility];   Service: Ophthalmology;  Laterality: Right;  . RADIOLOGY WITH ANESTHESIA N/A 04/07/2017   Procedure: RADIOLOGY WITH ANESTHESIA;  Surgeon: [name], MD;  [facility];  Service: Radiology;  Laterality: N/A;  . REPLACEMENT TOTAL KNEE BILATERAL  2004  . TEMPOROMANDIBULAR JOINT SURGERY     2 surgeries  . TUMOR REMOVAL    . VAGOTOMY  01/23/2012   Procedure: VAGOTOMY, antrectomy and BII;  Surgeon: [name], MD;  Location: [facility] OR;  Service: General;  Laterality: N/A;  Laparotomy with vagotomy.    There were no vitals filed for this visit.  Subjective Assessment - 11/28/17 1537    Subjective  Pt reports Lyrica is making her really dizzy so she doesn't take it every day.  Pain in RLE has moved and is down her posterior LE down to posterior knee.  Also feels tingling in the toes.      Patient is accompained by:  Family member spouse    Limitations  Standing;Walking    Currently in Pain?  Yes    Pain Score  7     Pain Location  Leg    Pain Orientation  Right    Pain Descriptors / Indicators  Shooting    Pain Type  Neuropathic pain    Pain Onset  More than a month ago  [facility] Adult PT Treatment/Exercise - 11/28/17 1610      Ambulation/Gait   Ambulation/Gait  Yes    Ambulation/Gait Assistance  5: Supervision    Ambulation/Gait Assistance Details  Pt noted to have increased ataxia in LLE today; when pt attending to L foot, pt able to advance and place LLE in RW with improved coordination and motor control    Ambulation Distance (Feet)  100 Feet    Assistive device  Rolling walker    Gait Pattern  Step-through pattern;Wide base of support;Trunk flexed;Ataxic    Ambulation Surface  Level;Indoor      Neuro Re-ed    Neuro Re-ed Details   in quadruped and then tall kneeling with trunk or UE supported on stability "peanut" ball; in quadruped performed alternating rocking forwards<>back, alternating UE reaching, alternating hip extension,  alternating hip ABD.  Transitioned to tall kneeling and performed alternating hip ABD and flexion to tall kneeling with min A and cues for core and proximal hip stability; NMR for focus on proprioception, weight shifting, trunk control and postural control, coordination and grading of movement.  Pt reporting significant RLE pain during NMR but able to continue.          Balance Exercises - 11/28/17 1637      Balance Exercises: Standing   Standing, One Foot on a Step  Eyes open;2 inch;5 reps contralateral LE performing taps forwards, lat, back; SLS    Sit to Stand Time  Sit > stand with R then L foot on block in front for force weight shift, WB and activation of contralateral LE during sit <> stand.  Verbal and tactile cues for weight shift and use of counting for controlled exhalation and increased eccentric control during descent to sit; 5 reps each LE          PT Short Term Goals - 11/14/17 1715      PT SHORT TERM GOAL #1   Title  Pt will improve DGI by 4 points from initial assessment    Baseline  10/24    Time  4    Period  Weeks    Status  Revised    Target Date  12/14/17      PT SHORT TERM GOAL #2   Title  Pt will decrease falls risk with gait as indicated by increase in gait velocity with LRAD to > or = 2.0 ft/sec    Baseline  1.55 ft/sec when experiencing pain in RLE    Time  4    Period  Weeks    Status  Revised    Target Date  12/14/17      PT SHORT TERM GOAL #3   Title  Pt will decrease falls risk as indicated by increase in BERG balance score to > or = 40/56    Baseline  34/56 when experiencing increased pain in RLE    Time  4    Period  Weeks    Status  Revised    Target Date  12/14/17      PT SHORT TERM GOAL #4   Title  Pt will ambulate >200' on indoor surfaces with cane (or no AD) and negotiate 4 stairs with one rail, alternating sequence with min A     Time  4    Period  Weeks    Status  Revised    Target Date  12/14/17        PT Long Term Goals -  11/14/17 1719  PT LONG TERM GOAL #1   Title  Pt and husband will demonstrate independence with HEP    Time  8    Period  Weeks    Status  Revised    Target Date  01/13/18      PT LONG TERM GOAL #2   Title  Pt will demonstrate improved balance and decreased falls risk as indicated by BERG balance score of > or = 45/56    Baseline   34/56 taken 12/3       Time  8    Period  Weeks    Status  Revised    Target Date  01/13/18      PT LONG TERM GOAL #3   Title  Pt will ambulate with cane (or no AD) x 300' over outdoor paved surfaces with min A; will negotiate 4 stairs with one rail with alternating sequence and supervision    Time  8    Period  Weeks    Status  Revised    Target Date  01/13/18      PT LONG TERM GOAL #4   Title  Pt will report 15% improvement in Neuro QOL-LE    Baseline  31.3%    Time  8    Period  Weeks    Status  Revised    Target Date  01/13/18      PT LONG TERM GOAL #5   Title  Pt will decrease falls risk during gait in home/community as indicated by increase in gait velocity to > or = 2.5 ft/sec with LRAD    Baseline  1.55 ft/sec on 12/3    Time  8    Period  Weeks    Status  Revised    Target Date  01/13/18      PT LONG TERM GOAL #6   Title  Pt will demonstrate decreased falls risk with gait as indicated by increase in DGI score by 8 points from initial assessment    Baseline  10/24    Time  8    Period  Weeks    Status  Revised    Target Date  01/13/18            Plan - 11/28/17 1641    Clinical Impression Statement  Continued NMR in quadruped, tall kneeling and standing to continue to address balance, motor control and grading of movement during weight shifting and transitional movements.  Pt continues to report significant neuropathic pain that is now spreading down her posterior LE.  Pt is scheduled for nn block on January 11th.  Will continue to progress towards LTG as pt is able to tolerate.    Rehab Potential  Good    PT Frequency   2x / week    PT Duration  8 weeks    PT Treatment/Interventions  ADLs/Self Care Home Management;Aquatic Therapy;DME Instruction;Gait training;Stair training;Functional mobility training;Therapeutic activities;Therapeutic exercise;Balance training;Neuromuscular re-education;Patient/family education;Vestibular;Visual/perceptual remediation/compensation;Cognitive remediation;Orthotic Fit/Training    PT Next Visit Plan  ASSESS VITALS AT [facility]EACH VISIT.  Gait - treadmill if pt can tolerate.  gait without AD: in // bars, side stepping, retro, Physioball exercises for postural control.      Consulted and Agree with Plan of Care  Patient;Family member/caregiver    Family Member Consulted  husband       Patient will benefit from skilled therapeutic intervention in order to improve the following deficits and impairments:  Abnormal gait, Decreased balance, Decreased cognition, Decreased coordination, Decreased strength, Difficulty  walking, Dizziness, Impaired sensation, Impaired vision/preception, Pain, Decreased activity tolerance, Decreased mobility  Visit Diagnosis: Ataxic gait  Other lack of coordination  Other abnormalities of gait and mobility  Unsteadiness on feet  Dizziness and giddiness     Problem List Patient Active Problem List   Diagnosis Date Noted  . 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 Weakness with dizziness-  since [facility] and CVA 04/07/17 08/07/2017  . Disturbances of vision, late effect of stroke 08/06/2017  . Health education/counseling 08/05/2017  . High risk medications (not anticoagulants) long-term use 08/05/2017  . Neuritis-  R sided:  arm and leg/ body due to stroke 08/05/2017  . Elevated LDL cholesterol level 07/11/2017  . [employer] of [employer] (NIH) Stroke Scale limb ataxia score 2, ataxia present in two limbs 06/25/2017  . 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 Elevated vitamin B12 level 05/30/2017  . Vitamin D deficiency 05/29/2017  .  History of tobacco abuse-  30pk yr hx - quit 04/07/17 05/21/2017  . Gait disturbance, post-stroke 05/14/2017  . Benign essential HTN   . Vitreous hemorrhage of right eye ([name])   . Adjustment disorder with mixed anxiety and depressed mood   . 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 s/p SAH (subarachnoid hemorrhage) ([name]) 04/19/2017  . Basilar artery aneurysm ([name])   . Hypoxia   . 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 CVA (cerebral vascular accident) ([name]) 04/07/2017  . Elevated gastrin level 01/25/2012  . Hypokalemia 01/21/2012  . Nausea & vomiting 01/20/2012  . Epigastric pain 01/20/2012  . Duodenal ulcer, acute with obstruction 10/17/2011  . S/P laparoscopic cholecystectomy 10/16/2011    Dierdre Highman, PT, DPT 11/28/17    4:49 PM    [facility] [address] [place], [place], [address] Phone: [phone]   Fax:  [phone]  Name: ANALISE GLOTFELTY MRN: [id number] Date of Birth: [birth year]

## 2017-12-02 ENCOUNTER — Other Ambulatory Visit: Payer: Self-pay | Admitting: Family Medicine

## 2017-12-02 DIAGNOSIS — I10 Essential (primary) hypertension: Secondary | ICD-10-CM

## 2017-12-04 ENCOUNTER — Ambulatory Visit: Payer: BLUE CROSS/BLUE SHIELD | Admitting: Physical Therapy

## 2017-12-04 ENCOUNTER — Encounter: Payer: Self-pay | Admitting: Physical Therapy

## 2017-12-04 ENCOUNTER — Ambulatory Visit: Payer: BLUE CROSS/BLUE SHIELD | Admitting: Occupational Therapy

## 2017-12-04 DIAGNOSIS — R42 Dizziness and giddiness: Secondary | ICD-10-CM

## 2017-12-04 DIAGNOSIS — R2689 Other abnormalities of gait and mobility: Secondary | ICD-10-CM

## 2017-12-04 DIAGNOSIS — R2681 Unsteadiness on feet: Secondary | ICD-10-CM

## 2017-12-04 DIAGNOSIS — R278 Other lack of coordination: Secondary | ICD-10-CM

## 2017-12-04 DIAGNOSIS — I69054 Hemiplegia and hemiparesis following nontraumatic subarachnoid hemorrhage affecting left non-dominant side: Secondary | ICD-10-CM | POA: Diagnosis not present

## 2017-12-05 NOTE — Therapy (Signed)
[facility] [address] [place], [place], [address] Phone: [phone]   Fax:  [phone]  Physical Therapy Treatment  Patient Details  Name: [name] Barnett MRN: [id number] Date of Birth: 01/28/1976 Referring Provider: Erick ColaceAndrew E Kirsteins, MD   Encounter Date: 12/04/2017  PT End of Session - 12/04/17 1409    Visit Number  35    Number of Visits  46    Date for PT Re-Evaluation  01/13/18    Authorization Type  BCBS    PT Start Time  1402    PT Stop Time  1445    PT Time Calculation (min)  43 min    Equipment Utilized During Treatment  Gait belt    Activity Tolerance  Patient limited by pain;Patient tolerated treatment well    Behavior During Therapy  [facility] for tasks assessed/performed       Past Medical History:  Diagnosis Date  . Duodenal obstruction   . GERD (gastroesophageal reflux disease)   . Hypertension   . Stroke [facility]([name])    april 2018    Past Surgical History:  Procedure Laterality Date  . ABDOMINAL HYSTERECTOMY    . BALLOON DILATION  12/31/2011   Procedure: BALLOON DILATION;  Surgeon: Barrie FolkJohn C Hayes, MD;  [facility];  Service: Endoscopy;  Laterality: N/A;  . CHOLECYSTECTOMY  07/28/11  . ESOPHAGOGASTRODUODENOSCOPY  10/17/2011   Procedure: ESOPHAGOGASTRODUODENOSCOPY (EGD);  Surgeon: Barrie FolkJohn C Hayes, MD;  [facility];  Service: Endoscopy;  Laterality: N/A;  . IR ANGIO INTRA EXTRACRAN SEL INTERNAL CAROTID BILAT MOD SED  04/07/2017  . IR ANGIO VERTEBRAL SEL VERTEBRAL UNI R MOD SED  04/07/2017  . IR ANGIOGRAM FOLLOW UP STUDY  04/07/2017  . IR ANGIOGRAM FOLLOW UP STUDY  04/07/2017  . IR ANGIOGRAM FOLLOW UP STUDY  04/07/2017  . IR ANGIOGRAM FOLLOW UP STUDY  04/07/2017  . IR ANGIOGRAM FOLLOW UP STUDY  04/07/2017  . IR ANGIOGRAM SELECTIVE EACH ADDITIONAL VESSEL  04/07/2017  . IR TRANSCATH/EMBOLIZ  04/07/2017  . PARS PLANA VITRECTOMY Right 05/01/2017   Procedure: PARS PLANA VITRECTOMY WITH 25 GAUGE  RIGHT EYE, endolaser photocoaglation;  Surgeon: Carmela RimaPatel, Narendra, MD;  [facility];  Service: Ophthalmology;  Laterality: Right;  . RADIOLOGY WITH ANESTHESIA N/A 04/07/2017   Procedure: RADIOLOGY WITH ANESTHESIA;  Surgeon: [name], MD;  [facility];  Service: Radiology;  Laterality: N/A;  . REPLACEMENT TOTAL KNEE BILATERAL  2004  . TEMPOROMANDIBULAR JOINT SURGERY     2 surgeries  . TUMOR REMOVAL    . VAGOTOMY  01/23/2012   Procedure: VAGOTOMY, antrectomy and BII;  Surgeon: [name], MD;  Location: [facility] OR;  Service: General;  Laterality: N/A;  Laparotomy with vagotomy.    There were no vitals filed for this visit.  Subjective Assessment - 12/04/17 1405    Subjective  Had a "fall" this past Saturday. She stood up from waking up in the am and her left knee gave way. Landed on left knee mostly. Was able to get herself back up on the sofa. Only injury was the scab on her left knee came off. Denies any dizziness at that time. No other reports. Continues to have right LE pain.    Patient is accompained by:  Family member    Limitations  Standing;Walking    Currently in Pain?  Yes    Pain Score  7     Pain Location  Leg    Pain Orientation  Left  Pain Descriptors / Market researcher;Stabbing;Squeezing    Pain Type  Neuropathic pain    Pain Radiating Towards  from thigh all the way to knee     Pain Onset  More than a month ago    Pain Frequency  Constant    Aggravating Factors   standing, walking,  weight bearing    Pain Relieving Factors  sitting          [facility] Adult PT Treatment/Exercise - 12/04/17 1410      Transfers   Transfers  Sit to Stand;Stand to Sit    Sit to Stand  4: Min guard;4: Min assist;Without upper extremity assist;From bed    Stand to Sit  4: Min guard;4: Min assist;Without upper extremity assist    Comments  in staggered stance x 6-7 with each foot forward. assistance needed for weight shifting, power up and controlled descent with  sitting.       Ambulation/Gait   Ambulation/Gait  Yes    Ambulation/Gait Assistance  5: Supervision;4: Min guard    Ambulation/Gait Assistance Details  cues for left hand placement on RW and to attend to left LE with swing phase so not to kick RW.    Ambulation Distance (Feet)  100 Feet x2, plus around gym as needed    Assistive device  Rolling walker    Gait Pattern  Step-through pattern;Wide base of support;Trunk flexed;Ataxic    Ambulation Surface  Level;Indoor      High Level Balance   High Level Balance Activities  Side stepping;Backward walking;Other (comment) forward gait no UE support    High Level Balance Comments  in parallel bars x 4 laps each with min to mod assist for balance, no UE support. incr dizziness with backward walking that resolved with seated rest afterwards.       Neuro Re-ed    Neuro Re-ed Details   neuro re-ed for core/LE strengthening, proprioception, weight shifitng, weight bearing and for trunk/postural control: quadruped over blue peanut- alt UE raises, then alt LE raises with min/mod assist for balance and cues on form/technique; tall kneelin with UE support on peanut- alt UE raises, mini squats and hip abd/add (alternating legs) with min assist, cues/facilitation for posture and ex form/technique.           PT Short Term Goals - 11/14/17 1715      PT SHORT TERM GOAL #1   Title  Pt will improve DGI by 4 points from initial assessment    Baseline  10/24    Time  4    Period  Weeks    Status  Revised    Target Date  12/14/17      PT SHORT TERM GOAL #2   Title  Pt will decrease falls risk with gait as indicated by increase in gait velocity with LRAD to > or = 2.0 ft/sec    Baseline  1.55 ft/sec when experiencing pain in RLE    Time  4    Period  Weeks    Status  Revised    Target Date  12/14/17      PT SHORT TERM GOAL #3   Title  Pt will decrease falls risk as indicated by increase in BERG balance score to > or = 40/56    Baseline  34/56 when  experiencing increased pain in RLE    Time  4    Period  Weeks    Status  Revised    Target Date  12/14/17  PT SHORT TERM GOAL #4   Title  Pt will ambulate >200' on indoor surfaces with cane (or no AD) and negotiate 4 stairs with one rail, alternating sequence with min A     Time  4    Period  Weeks    Status  Revised    Target Date  12/14/17        PT Long Term Goals - 11/14/17 1719      PT LONG TERM GOAL #1   Title  Pt and husband will demonstrate independence with HEP    Time  8    Period  Weeks    Status  Revised    Target Date  01/13/18      PT LONG TERM GOAL #2   Title  Pt will demonstrate improved balance and decreased falls risk as indicated by BERG balance score of > or = 45/56    Baseline   34/56 taken 12/3       Time  8    Period  Weeks    Status  Revised    Target Date  01/13/18      PT LONG TERM GOAL #3   Title  Pt will ambulate with cane (or no AD) x 300' over outdoor paved surfaces with min A; will negotiate 4 stairs with one rail with alternating sequence and supervision    Time  8    Period  Weeks    Status  Revised    Target Date  01/13/18      PT LONG TERM GOAL #4   Title  Pt will report 15% improvement in Neuro QOL-LE    Baseline  31.3%    Time  8    Period  Weeks    Status  Revised    Target Date  01/13/18      PT LONG TERM GOAL #5   Title  Pt will decrease falls risk during gait in home/community as indicated by increase in gait velocity to > or = 2.5 ft/sec with LRAD    Baseline  1.55 ft/sec on 12/3    Time  8    Period  Weeks    Status  Revised    Target Date  01/13/18      PT LONG TERM GOAL #6   Title  Pt will demonstrate decreased falls risk with gait as indicated by increase in DGI score by 8 points from initial assessment    Baseline  10/24    Time  8    Period  Weeks    Status  Revised    Target Date  01/13/18        Plan - 12/04/17 1409    Clinical Impression Statement  Today's skilled session continued to focus on  NMR in quadruped/tall kneeling and on dynamic balance activities. Pt continues to be limited by LE pain, however tries to "push through" as much as she can with activities. Pt is progressing toward goals and should benefit from continued PT to progress toward unmet goals.     Rehab Potential  Good    PT Frequency  2x / week    PT Duration  8 weeks    PT Treatment/Interventions  ADLs/Self Care Home Management;Aquatic Therapy;DME Instruction;Gait training;Stair training;Functional mobility training;Therapeutic activities;Therapeutic exercise;Balance training;Neuromuscular re-education;Patient/family education;Vestibular;Visual/perceptual remediation/compensation;Cognitive remediation;Orthotic Fit/Training    PT Next Visit Plan  Gait - treadmill if pt can tolerate.  gait without AD: in // bars, side stepping, retro, Physioball exercises for postural control.  Consulted and Agree with Plan of Care  Patient;Family member/caregiver    Family Member Consulted  husband       Patient will benefit from skilled therapeutic intervention in order to improve the following deficits and impairments:  Abnormal gait, Decreased balance, Decreased cognition, Decreased coordination, Decreased strength, Difficulty walking, Dizziness, Impaired sensation, Impaired vision/preception, Pain, Decreased activity tolerance, Decreased mobility  Visit Diagnosis: Other abnormalities of gait and mobility  Unsteadiness on feet  Dizziness and giddiness  Other lack of coordination     Problem List Patient Active Problem List   Diagnosis Date Noted  . 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 Weakness with dizziness-  since [facility] and CVA 04/07/17 08/07/2017  . Disturbances of vision, late effect of stroke 08/06/2017  . Health education/counseling 08/05/2017  . High risk medications (not anticoagulants) long-term use 08/05/2017  . Neuritis-  R sided:  arm and leg/ body due to stroke 08/05/2017  . Elevated LDL cholesterol level  07/11/2017  . [employer] (NIH) Stroke Scale limb ataxia score 2, ataxia present in two limbs 06/25/2017  . 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 Elevated vitamin B12 level 05/30/2017  . Vitamin D deficiency 05/29/2017  . History of tobacco abuse-  30pk yr hx - quit 04/07/17 05/21/2017  . Gait disturbance, post-stroke 05/14/2017  . Benign essential HTN   . Vitreous hemorrhage of right eye ([name])   . Adjustment disorder with mixed anxiety and depressed mood   . 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 s/p SAH (subarachnoid hemorrhage) ([name]) 04/19/2017  . Basilar artery aneurysm ([name])   . Hypoxia   . 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 CVA (cerebral vascular accident) ([name]) 04/07/2017  . Elevated gastrin level 01/25/2012  . Hypokalemia 01/21/2012  . Nausea & vomiting 01/20/2012  . Epigastric pain 01/20/2012  . Duodenal ulcer, acute with obstruction 10/17/2011  . S/P laparoscopic cholecystectomy 10/16/2011    [name], PTA, [facility] [address] [place], [place] [address] [phone] 12/05/17, 6:43 PM   Name: JAHNAVI MURATORE MRN: [id number] Date of Birth: 16-Nov-1976

## 2017-12-06 ENCOUNTER — Ambulatory Visit: Payer: BLUE CROSS/BLUE SHIELD | Admitting: Occupational Therapy

## 2017-12-06 ENCOUNTER — Encounter: Payer: Self-pay | Admitting: Physical Therapy

## 2017-12-06 ENCOUNTER — Ambulatory Visit: Payer: BLUE CROSS/BLUE SHIELD | Admitting: Physical Therapy

## 2017-12-06 DIAGNOSIS — I69018 Other symptoms and signs involving cognitive functions following nontraumatic subarachnoid hemorrhage: Secondary | ICD-10-CM

## 2017-12-06 DIAGNOSIS — R2689 Other abnormalities of gait and mobility: Secondary | ICD-10-CM

## 2017-12-06 DIAGNOSIS — R278 Other lack of coordination: Secondary | ICD-10-CM

## 2017-12-06 DIAGNOSIS — R2681 Unsteadiness on feet: Secondary | ICD-10-CM

## 2017-12-06 DIAGNOSIS — R41840 Attention and concentration deficit: Secondary | ICD-10-CM

## 2017-12-06 DIAGNOSIS — R42 Dizziness and giddiness: Secondary | ICD-10-CM

## 2017-12-06 DIAGNOSIS — R41842 Visuospatial deficit: Secondary | ICD-10-CM

## 2017-12-06 DIAGNOSIS — I69054 Hemiplegia and hemiparesis following nontraumatic subarachnoid hemorrhage affecting left non-dominant side: Secondary | ICD-10-CM | POA: Diagnosis not present

## 2017-12-06 NOTE — Therapy (Signed)
[facility] [address] [place], [place], [address] Phone: [phone]   Fax:  [phone]  Occupational Therapy Treatment  Patient Details  Name: [name] Barnett MRN: [id number] Date of Birth: 1976-10-31 Referring Provider (Historical): Dr. Alysia Penna   Encounter Date: 12/06/2017  OT End of Session - 12/06/17 1517    Visit Number  31    Number of Visits  20    Date for OT Re-Evaluation  01/13/18    Authorization Type  BCBS, no visit limit/auth req.    Authorization Time Period  renewal completed 11/14/17 for 8 wks    OT Start Time  1405    OT Stop Time  1445    OT Time Calculation (min)  40 min       Past Medical History:  Diagnosis Date  . Duodenal obstruction   . GERD (gastroesophageal reflux disease)   . Hypertension   . Stroke [facility])    april 2018    Past Surgical History:  Procedure Laterality Date  . ABDOMINAL HYSTERECTOMY    . BALLOON DILATION  12/31/2011   Procedure: BALLOON DILATION;  Surgeon: Missy Sabins, MD;  Location: [facility] ENDOSCOPY;  Service: Endoscopy;  Laterality: N/A;  . CHOLECYSTECTOMY  07/28/11  . ESOPHAGOGASTRODUODENOSCOPY  10/17/2011   Procedure: ESOPHAGOGASTRODUODENOSCOPY (EGD);  Surgeon: Missy Sabins, MD;  [facility] ENDOSCOPY;  Service: Endoscopy;  Laterality: N/A;  . IR ANGIO INTRA EXTRACRAN SEL INTERNAL CAROTID BILAT MOD SED  04/07/2017  . IR ANGIO VERTEBRAL SEL VERTEBRAL UNI R MOD SED  04/07/2017  . IR ANGIOGRAM FOLLOW UP STUDY  04/07/2017  . IR ANGIOGRAM FOLLOW UP STUDY  04/07/2017  . IR ANGIOGRAM FOLLOW UP STUDY  04/07/2017  . IR ANGIOGRAM FOLLOW UP STUDY  04/07/2017  . IR ANGIOGRAM FOLLOW UP STUDY  04/07/2017  . IR ANGIOGRAM SELECTIVE EACH ADDITIONAL VESSEL  04/07/2017  . IR TRANSCATH/EMBOLIZ  04/07/2017  . PARS PLANA VITRECTOMY Right 05/01/2017   Procedure: PARS PLANA VITRECTOMY WITH 25 GAUGE RIGHT EYE, endolaser photocoaglation;  Surgeon: Jalene Mullet, MD;  Location: [facility];   Service: Ophthalmology;  Laterality: Right;  . RADIOLOGY WITH ANESTHESIA N/A 04/07/2017   Procedure: RADIOLOGY WITH ANESTHESIA;  Surgeon: Consuella Lose, MD;  Location: [place];  Service: Radiology;  Laterality: N/A;  . REPLACEMENT TOTAL KNEE BILATERAL  2004  . TEMPOROMANDIBULAR JOINT SURGERY     2 surgeries  . TUMOR REMOVAL    . VAGOTOMY  01/23/2012   Procedure: VAGOTOMY, antrectomy and BII;  Surgeon: Haywood Lasso, MD;  Location: [place];  Service: General;  Laterality: N/A;  Laparotomy with vagotomy.    There were no vitals filed for this visit.  Subjective Assessment - 12/06/17 1408    Subjective   Pt reports" I haven't been using my hand as much as I should    Pertinent History  Subarachnoid hemorrhage due to ruptured aneurysm, ataxia (L cerebellar); HTN, R side decr sensation, vertical diplopia/visual deficits, ataxia, Bilateral retinal hemorhage associated with SAH (with surgery on R eye in hospital and L approx 1 month ago)    Limitations  visual deficits, fall risk, impulsivity/cognitive deficits    Patient Stated Goals  be able to walk and use L arm    Currently in Pain?  Yes    Pain Score  5     Pain Location  Leg    Pain Orientation  Left    Pain Type  Neuropathic pain    Pain Onset  More than a month ago    Pain Frequency  Constant    Aggravating Factors   standing    Pain Relieving Factors  sitting           Treatment: standing at table top weightbearing through bilateral UE's rocking forwards/ backwards, min -mod facilitation Placing pieces into concentration frame, mod difficulty, and increased time, min-mod v.c for performance. Standing to place graded clothespins on vertical antennae, min difficulty/ v.c to slow down and focus Arm bike x 5 mins level 4 for conditioning                  OT Short Term Goals - 11/14/17 1732      OT SHORT TERM GOAL #1   Title  Pt/caregiver will be independent with initial HEP.    Time  4    Period  Weeks     Status  Achieved 08/19/17:  with cueing from husband      OT [place] #2   Title  Pt will be use LUE as nondominant assist at least 50% of the time for ADLs without cueing.    Time  4    Period  Weeks    Status  Achieved 08/19/17:  approx 40% of the time.  09/19/17:  90%      OT SHORT TERM GOAL #3   Title  Pt will improve LUE coordination/control to improve score on box and blocks test by at least 6 with LUE.    Baseline  19 blocks    Time  4    Period  Weeks    Status  Achieved 08/22/17:  18, 20 blocks.  09/19/17:  25 blocks      OT SHORT TERM GOAL #4   Title  Pt will perform simple home maintenance task/snack prep in sitting with set-up.    Time  4    Period  Weeks    Status  Achieved 08/19/17:  met in clinic with set-up and cueing/prompts for encouragement/to slow down      OT SHORT TERM GOAL #5   Title  Pt/husband will verbalize understanding of visual compensation stategies for improved safety/incr ease with ADLs.    Time  4    Period  Weeks    Status  Achieved      OT SHORT TERM GOAL #6   Title  Pt will perform simple environmental scanning/navigation with CGA.--check STGs 10/18/17    Time  4    Period  Weeks    Status  Achieved 10/16/17:  met at approx this level      OT SHORT TERM GOAL #7   Title  Pt will complete functional task with LUE with no more than min cueing for impulsivity.--check STGs 12/14/16    Time  4    Period  Weeks    Status  On-going 10/16/17:  min-mod cueing; 11/14/17 inconsistent depending on task/frustration level    Target Date  12/14/17      OT SHORT TERM GOAL #8   Title  Pt will perform simple home maintenance task/snack prep in standing with set-up/supervision and no more than 1 v.c. for safety.    Time  4    Period  Weeks    Status  New    Target Date  12/14/17      OT SHORT TERM GOAL  #9   TITLE  Pt will improve coordination for ADLs as shown by completing 9-hole peg test in less than 59mn  30sec.    Baseline  25mn 55.44sec    Time  4     Period  Weeks    Status  New    Target Date  12/14/17        OT Long Term Goals - 11/14/17 1734      OT LONG TERM GOAL #1   Title  Pt/caregiver will be independent with updated HEP.--check LTGs 01/13/18    Time  8    Period  Weeks    Status  On-going 11/14/17:  needs further updates    Target Date  01/13/18      OT LONG TERM GOAL #2   Title  Pt will be use LUE as nondominant assist at least 75% of the time for ADLs without cueing.    Time  8    Period  Weeks    Status  Achieved uses 75% x however needs prompting at times.  09/19/17:  met per pt  90%      OT LONG TERM GOAL #3   Title  Pt will perform toileting mod I (including transfer).    Time  8    Period  Weeks    Status  Achieved supervision.  09/19/17:  continues to need superivsion for mobility.  11/05/17  met      OT LONG TERM GOAL #4   Title  Pt will improve LUE coordination/control to improve score on box and blocks to at least 27 with LUE in 2 trials.--revised 11/14/17    Baseline  19 blocks    Time  8    Period  Weeks    Status  Revised 18, 19 blocks.  09/19/17:  25 blocks.  11/14/17:  19 blocks/inconsistent with frustration level      OT LONG TERM GOAL #5   Title  Pt will perform simple home maintenance task/snack prep in standing mod I.--updated 11/14/17    Time  8    Period  Weeks    Status  Revised Pt is not perfoming consistely, she has made a sandwich in seated.  11/14/17:  approximating, supervision/min A and cueing      OT LONG TERM GOAL #6   Title  Pt will perform simple environmental scanning/navigation with mod I.--revised 11/14/17    Time  8    Period  Weeks    Status  Revised 11/05/17  min cueing and supervision for mobility, environmental scanning with good accuracy      OT LONG TERM GOAL #7   Title  Pt will improve coordination for ADLs as shown by completing 9-hole peg test in less than 258m.    Baseline  72m572m5.44sec    Time  8    Period  Weeks    Status  New            Plan -  12/06/17 1518    Clinical Impression Statement  Pt demonstrates significant stiffness when walking into therapy today. Pt is progressing towards goals for LUE functional use.    Occupational performance deficits (Please refer to evaluation for details):  ADL's;IADL's;Leisure;Social Participation;Work    RehNeurosurgeon Current Impairments/barriers affecting progress:  cognitive deficits, impulsivity    OT Frequency  2x / week    OT Duration  8 weeks    OT Treatment/Interventions  Self-care/ADL training;Therapeutic exercise;DME and/or AE instruction;Therapeutic activities;Cognitive remediation/compensation;Visual/perceptual remediation/compensation;Passive range of motion;Functional Mobility Training;Neuromuscular education;Cryotherapy;Paraffin;Energy conservation;Manual Therapy;Patient/family education;Ultrasound;Balance training;Moist Heat;Fluidtherapy    Plan  continue with simple home  maintenance tasks, LUE functional use    Consulted and Agree with Plan of Care  Patient;Family member/caregiver    Family Member Consulted  mother       Patient will benefit from skilled therapeutic intervention in order to improve the following deficits and impairments:  Decreased coordination, Decreased range of motion, Difficulty walking, Abnormal gait, Decreased safety awareness, Impaired sensation, Decreased knowledge of precautions, Decreased balance, Decreased knowledge of use of DME, Impaired UE functional use, Decreased cognition, Decreased mobility, Decreased strength, Impaired vision/preception, Impaired perceived functional ability  Visit Diagnosis: Other lack of coordination  Attention and concentration deficit  Visuospatial deficit  Other symptoms and signs involving cognitive functions following nontraumatic subarachnoid hemorrhage    Problem List Patient Active Problem List   Diagnosis Date Noted  . 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 Weakness with dizziness-  since [facility] and CVA  04/07/17 08/07/2017  . Disturbances of vision, late effect of stroke 08/06/2017  . Health education/counseling 08/05/2017  . High risk medications (not anticoagulants) long-term use 08/05/2017  . Neuritis-  R sided:  arm and leg/ body due to stroke 08/05/2017  . Elevated LDL cholesterol level 07/11/2017  . [employer] (NIH) Stroke Scale limb ataxia score 2, ataxia present in two limbs 06/25/2017  . 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 Elevated vitamin B12 level 05/30/2017  . Vitamin D deficiency 05/29/2017  . History of tobacco abuse-  30pk yr hx - quit 04/07/17 05/21/2017  . Gait disturbance, post-stroke 05/14/2017  . Benign essential HTN   . Vitreous hemorrhage of right eye ([place])   . Adjustment disorder with mixed anxiety and depressed mood   . 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 Terson syndrome of both eyes ([place]) 04/23/2017  . s/p SAH (subarachnoid hemorrhage) ([place]) 04/19/2017  . Basilar artery aneurysm ([place])   . Hypoxia   . Subarachnoid hemorrhage due to ruptured aneurysm ([place]) 04/07/2017  . CVA (cerebral vascular accident) ([place]) 04/07/2017  . Elevated gastrin level 01/25/2012  . Hypokalemia 01/21/2012  . Nausea & vomiting 01/20/2012  . Epigastric pain 01/20/2012  . Duodenal ulcer, acute with obstruction 10/17/2011  . S/P laparoscopic cholecystectomy 10/16/2011    Jillienne Egner 12/06/2017, 3:19 PM  [facility] [address] [place], [place], [address] Phone: [phone]   Fax:  [phone]  Name: [name] Barnett MRN: [id number] Date of Birth: April 01, 1976

## 2017-12-06 NOTE — Therapy (Signed)
[facility] [address] [place], [place], [address] Phone: [phone]   Fax:  [phone]  Physical Therapy Treatment  Patient Details  Name: [name] Barnett MRN: [id number] Date of Birth: 03/29/1976 Referring Provider: Erick Colace, MD   Encounter Date: 12/06/2017  PT End of Session - 12/06/17 1450    Visit Number  36    Number of Visits  46    Date for PT Re-Evaluation  01/13/18    Authorization Type  BCBS    PT Start Time  1447    PT Stop Time  1530    PT Time Calculation (min)  43 min    Equipment Utilized During Treatment  Gait belt    Activity Tolerance  Patient limited by pain;Patient tolerated treatment well    Behavior During Therapy  [facility] for tasks assessed/performed       Past Medical History:  Diagnosis Date  . Duodenal obstruction   . GERD (gastroesophageal reflux disease)   . Hypertension   . Stroke [facility])    april 2018    Past Surgical History:  Procedure Laterality Date  . ABDOMINAL HYSTERECTOMY    . BALLOON DILATION  12/31/2011   Procedure: BALLOON DILATION;  Surgeon: Barrie Folk, MD;  Location: [facility] ENDOSCOPY;  Service: Endoscopy;  Laterality: N/A;  . CHOLECYSTECTOMY  07/28/11  . ESOPHAGOGASTRODUODENOSCOPY  10/17/2011   Procedure: ESOPHAGOGASTRODUODENOSCOPY (EGD);  Surgeon: Barrie Folk, MD;  Location[facility] ENDOSCOPY;  Service: Endoscopy;  Laterality: N/A;  . IR ANGIO INTRA EXTRACRAN SEL INTERNAL CAROTID BILAT MOD SED  04/07/2017  . IR ANGIO VERTEBRAL SEL VERTEBRAL UNI R MOD SED  04/07/2017  . IR ANGIOGRAM FOLLOW UP STUDY  04/07/2017  . IR ANGIOGRAM FOLLOW UP STUDY  04/07/2017  . IR ANGIOGRAM FOLLOW UP STUDY  04/07/2017  . IR ANGIOGRAM FOLLOW UP STUDY  04/07/2017  . IR ANGIOGRAM FOLLOW UP STUDY  04/07/2017  . IR ANGIOGRAM SELECTIVE EACH ADDITIONAL VESSEL  04/07/2017  . IR TRANSCATH/EMBOLIZ  04/07/2017  . PARS PLANA VITRECTOMY Right 05/01/2017   Procedure: PARS PLANA VITRECTOMY WITH 25 GAUGE  RIGHT EYE, endolaser photocoaglation;  Surgeon: Carmela Rima, MD;  Location[facility] OR;  Service: Ophthalmology;  Laterality: Right;  . RADIOLOGY WITH ANESTHESIA N/A 04/07/2017   Procedure: RADIOLOGY WITH ANESTHESIA;  Surgeon: Lisbeth Renshaw, MD;  Location: [facility] OR;  Service: Radiology;  Laterality: N/A;  . REPLACEMENT TOTAL KNEE BILATERAL  2004  . TEMPOROMANDIBULAR JOINT SURGERY     2 surgeries  . TUMOR REMOVAL    . VAGOTOMY  01/23/2012   Procedure: VAGOTOMY, antrectomy and BII;  Surgeon: Currie Paris, MD;  Location: [facility] OR;  Service: General;  Laterality: N/A;  Laparotomy with vagotomy.    There were no vitals filed for this visit.  Subjective Assessment - 12/06/17 1447    Subjective  No new complaints. No falls to report. States her right leg pain is better today vs previous days, 5/10 currently. Has increased her Lyrica, but only when not leaving the house because it makes her more dizzy.    Patient is accompained by:  Family member mom    Limitations  Standing;Walking    Currently in Pain?  Yes    Pain Score  5     Pain Location  Leg groin and thigh area    Pain Orientation  Right    Pain Descriptors / Indicators  Shooting;Squeezing;Stabbing    Pain Type  Neuropathic pain    Pain  Onset  More than a month ago    Pain Frequency  Constant    Aggravating Factors   standing    Pain Relieving Factors  sitting          [facility] Adult PT Treatment/Exercise - 12/06/17 1451      Transfers   Transfers  --      Ambulation/Gait   Ambulation/Gait  Yes    Ambulation/Gait Assistance  5: Supervision;4: Min guard    Ambulation Distance (Feet)  -- around gym, out of gym to lobby    Assistive device  Rolling walker    Gait Pattern  Step-through pattern;Wide base of support;Trunk flexed;Ataxic    Ambulation Surface  Level;Indoor      Neuro Re-ed    Neuro Re-ed Details   neuro re-ed for balance/postural control/strengthening: seated on blue pball with 1-2 UE support: bouncing x 1 minute,  pelvic rocks left/right and ant/post with cues on posture and technique, min guard to min assist for balance; seated still on ball- EC no head movements, progressing to EC head movements left<>right and up<>down for 30 sec's x 3 each with min guard to min assist for balance. cues on posture with EC ex's, min guard to min assist for balance.            Balance Exercises - 12/06/17 1504      Balance Exercises: Standing   SLS with Vectors  Solid surface;Upper extremity assist 1;Upper extremity assist 2;Other reps (comment);Limitations    Stepping Strategy  Anterior;Posterior;UE support;10 reps    Sit to Stand Time  sit<>stand with one foot forward on airex, other foot back x 5 each foot forward. minimal UE support with max cues for incr anterior weight shiftng, hip/knee extension in standing, to push down through heels to power up and for hip/knee flexion with "stick your booty out" for slow, controlled sitting to mat table.       Balance Exercises: Standing   SLS with Vectors Limitations  color circles in semi circle pattern: stepping to circle and back with emphasis on weight shifitng x 4 circles each leg. min to mod assist with cues on form, weight shifting and technique    Rebounder Limitations  stepping strategy: fwd/bwd stepping over half foam roll, alternating LE's with single UE support, 10 reps each side with min assist and cues on hip/knee flexion. cues to slow down for more controlled motions and for weight shifitng.                                     PT Short Term Goals - 11/14/17 1715      PT SHORT TERM GOAL #1   Title  Pt will improve DGI by 4 points from initial assessment    Baseline  10/24    Time  4    Period  Weeks    Status  Revised    Target Date  12/14/17      PT SHORT TERM GOAL #2   Title  Pt will decrease falls risk with gait as indicated by increase in gait velocity with LRAD to > or = 2.0 ft/sec    Baseline  1.55 ft/sec when experiencing pain in RLE    Time   4    Period  Weeks    Status  Revised    Target Date  12/14/17      PT SHORT TERM GOAL #3  Title  Pt will decrease falls risk as indicated by increase in BERG balance score to > or = 40/56    Baseline  34/56 when experiencing increased pain in RLE    Time  4    Period  Weeks    Status  Revised    Target Date  12/14/17      PT SHORT TERM GOAL #4   Title  Pt will ambulate >200' on indoor surfaces with cane (or no AD) and negotiate 4 stairs with one rail, alternating sequence with min A     Time  4    Period  Weeks    Status  Revised    Target Date  12/14/17        PT Long Term Goals - 11/14/17 1719      PT LONG TERM GOAL #1   Title  Pt and husband will demonstrate independence with HEP    Time  8    Period  Weeks    Status  Revised    Target Date  01/13/18      PT LONG TERM GOAL #2   Title  Pt will demonstrate improved balance and decreased falls risk as indicated by BERG balance score of > or = 45/56    Baseline   34/56 taken 12/3       Time  8    Period  Weeks    Status  Revised    Target Date  01/13/18      PT LONG TERM GOAL #3   Title  Pt will ambulate with cane (or no AD) x 300' over outdoor paved surfaces with min A; will negotiate 4 stairs with one rail with alternating sequence and supervision    Time  8    Period  Weeks    Status  Revised    Target Date  01/13/18      PT LONG TERM GOAL #4   Title  Pt will report 15% improvement in Neuro QOL-LE    Baseline  31.3%    Time  8    Period  Weeks    Status  Revised    Target Date  01/13/18      PT LONG TERM GOAL #5   Title  Pt will decrease falls risk during gait in home/community as indicated by increase in gait velocity to > or = 2.5 ft/sec with LRAD    Baseline  1.55 ft/sec on 12/3    Time  8    Period  Weeks    Status  Revised    Target Date  01/13/18      PT LONG TERM GOAL #6   Title  Pt will demonstrate decreased falls risk with gait as indicated by increase in DGI score by 8 points from initial  assessment    Baseline  10/24    Time  8    Period  Weeks    Status  Revised    Target Date  01/13/18            Plan - 12/06/17 1450    Clinical Impression Statement  Today's skilled session focused on NMR for postural control, balance and coordination with cues for posture and weight shifitng to assist with balance. Pt is progressing toward goals and should benefit from continued PT to progress toward unmet goals.     Rehab Potential  Good    PT Frequency  2x / week    PT Duration  8 weeks  PT Treatment/Interventions  ADLs/Self Care Home Management;Aquatic Therapy;DME Instruction;Gait training;Stair training;Functional mobility training;Therapeutic activities;Therapeutic exercise;Balance training;Neuromuscular re-education;Patient/family education;Vestibular;Visual/perceptual remediation/compensation;Cognitive remediation;Orthotic Fit/Training    PT Next Visit Plan  Gait - treadmill if pt can tolerate.  gait without AD: in // bars, side stepping, retro, Physioball exercises for postural control.      Consulted and Agree with Plan of Care  Patient;Family member/caregiver    Family Member Consulted  husband       Patient will benefit from skilled therapeutic intervention in order to improve the following deficits and impairments:  Abnormal gait, Decreased balance, Decreased cognition, Decreased coordination, Decreased strength, Difficulty walking, Dizziness, Impaired sensation, Impaired vision/preception, Pain, Decreased activity tolerance, Decreased mobility  Visit Diagnosis: Other abnormalities of gait and mobility  Unsteadiness on feet  Dizziness and giddiness  Other lack of coordination     Problem List Patient Active Problem List   Diagnosis Date Noted  . 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 [facility]iness-  [facility] 04/07[facility]17 08/07/2017  . Disturbances of vision, late effect of stroke 08/06/2017  . Health education/counseling 08/05/2017  . High risk  medications (not anticoagulants) long-term use 08/05/2017  . Neuritis-  R sided:  arm and leg/ body due to stroke 08/05/2017  . Elevated LDL cholesterol level 07/11/2017  . [employer] (NIH) Stroke Scale limb ataxia score 2, ataxia present in two limbs 06/25/2017  . 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 Elevated vitamin B12 level 05/30/2017  . Vitamin D deficiency 05/29/2017  . History of tobacco abuse-  30pk yr hx - quit 04/07/17 05/21/2017  . Gait disturbance, post-stroke 05/14/2017  . Benign essential HTN   . Vitreous hemorrhage of right eye ([name])   . Adjustment disorder with mixed anxiety and depressed mood   . 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 s/p SAH (subarachnoid hemorrhage) ([name]) 04/19/2017  . Basilar artery aneurysm ([name])   . Hypoxia   . 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 CVA (cerebral vascular accident) ([name]) 04/07/2017  . Elevated gastrin level 01/25/2012  . Hypokalemia 01/21/2012  . Nausea & vomiting 01/20/2012  . Epigastric pain 01/20/2012  . Duodenal ulcer, acute with obstruction 10/17/2011  . S/P laparoscopic cholecystectomy 10/16/2011    Sallyanne KusterKathy Aunna Snooks, PTA, [facility] [address], [address] [place], [place] [address] [phone] 12/06/17, 8:22 PM   Name: [name] Barnett MRN: [id number] Date of Birth: 09/24/1976

## 2017-12-07 ENCOUNTER — Other Ambulatory Visit: Payer: Self-pay | Admitting: Family Medicine

## 2017-12-07 DIAGNOSIS — I10 Essential (primary) hypertension: Secondary | ICD-10-CM

## 2017-12-11 ENCOUNTER — Ambulatory Visit: Payer: BLUE CROSS/BLUE SHIELD | Admitting: Occupational Therapy

## 2017-12-11 ENCOUNTER — Ambulatory Visit: Payer: BLUE CROSS/BLUE SHIELD | Attending: Physical Medicine & Rehabilitation | Admitting: Physical Therapy

## 2017-12-11 DIAGNOSIS — R41842 Visuospatial deficit: Secondary | ICD-10-CM | POA: Insufficient documentation

## 2017-12-11 DIAGNOSIS — R2681 Unsteadiness on feet: Secondary | ICD-10-CM | POA: Insufficient documentation

## 2017-12-11 DIAGNOSIS — I69018 Other symptoms and signs involving cognitive functions following nontraumatic subarachnoid hemorrhage: Secondary | ICD-10-CM | POA: Insufficient documentation

## 2017-12-11 DIAGNOSIS — R260 Ataxic gait: Secondary | ICD-10-CM | POA: Diagnosis not present

## 2017-12-11 DIAGNOSIS — R41840 Attention and concentration deficit: Secondary | ICD-10-CM | POA: Diagnosis present

## 2017-12-11 DIAGNOSIS — R414 Neurologic neglect syndrome: Secondary | ICD-10-CM | POA: Diagnosis present

## 2017-12-11 DIAGNOSIS — R42 Dizziness and giddiness: Secondary | ICD-10-CM | POA: Insufficient documentation

## 2017-12-11 DIAGNOSIS — R2689 Other abnormalities of gait and mobility: Secondary | ICD-10-CM | POA: Diagnosis present

## 2017-12-11 DIAGNOSIS — R278 Other lack of coordination: Secondary | ICD-10-CM

## 2017-12-11 DIAGNOSIS — R270 Ataxia, unspecified: Secondary | ICD-10-CM | POA: Insufficient documentation

## 2017-12-11 DIAGNOSIS — I69054 Hemiplegia and hemiparesis following nontraumatic subarachnoid hemorrhage affecting left non-dominant side: Secondary | ICD-10-CM

## 2017-12-11 DIAGNOSIS — R208 Other disturbances of skin sensation: Secondary | ICD-10-CM | POA: Diagnosis present

## 2017-12-11 NOTE — Therapy (Signed)
[facility] [address] [place], [place], [address] Phone: [phone]   Fax:  [phone]  Physical Therapy Treatment  Patient Details  Name: [name] Barnett MRN: [id number] Date of Birth: 12-04-1976 Referring Provider: Erick Colace, MD   Encounter Date: 12/11/2017  PT End of Session - 12/11/17 1639    Visit Number  37    Number of Visits  46    Date for PT Re-Evaluation  01/13/18    Authorization Type  BCBS    PT Start Time  1446    PT Stop Time  1534    PT Time Calculation (min)  48 min    Activity Tolerance  Patient limited by pain;Patient tolerated treatment well    Behavior During Therapy  [facility] for tasks assessed/performed       Past Medical History:  Diagnosis Date  . Duodenal obstruction   . GERD (gastroesophageal reflux disease)   . Hypertension   . Stroke [facility])    april 2018    Past Surgical History:  Procedure Laterality Date  . ABDOMINAL HYSTERECTOMY    . BALLOON DILATION  12/31/2011   Procedure: BALLOON DILATION;  Surgeon: Barrie Folk, MD;  Location: [facility] ENDOSCOPY;  Service: Endoscopy;  Laterality: N/A;  . CHOLECYSTECTOMY  07/28/11  . ESOPHAGOGASTRODUODENOSCOPY  10/17/2011   Procedure: ESOPHAGOGASTRODUODENOSCOPY (EGD);  Surgeon: Barrie Folk, MD;  Location: [facility] ENDOSCOPY;  Service: Endoscopy;  Laterality: N/A;  . IR ANGIO INTRA EXTRACRAN SEL INTERNAL CAROTID BILAT MOD SED  04/07/2017  . IR ANGIO VERTEBRAL SEL VERTEBRAL UNI R MOD SED  04/07/2017  . IR ANGIOGRAM FOLLOW UP STUDY  04/07/2017  . IR ANGIOGRAM FOLLOW UP STUDY  04/07/2017  . IR ANGIOGRAM FOLLOW UP STUDY  04/07/2017  . IR ANGIOGRAM FOLLOW UP STUDY  04/07/2017  . IR ANGIOGRAM FOLLOW UP STUDY  04/07/2017  . IR ANGIOGRAM SELECTIVE EACH ADDITIONAL VESSEL  04/07/2017  . IR TRANSCATH/EMBOLIZ  04/07/2017  . PARS PLANA VITRECTOMY Right 05/01/2017   Procedure: PARS PLANA VITRECTOMY WITH 25 GAUGE RIGHT EYE, endolaser photocoaglation;  Surgeon: Carmela Rima, MD;  Location[facility] OR;  Service: Ophthalmology;  Laterality: Right;  . RADIOLOGY WITH ANESTHESIA N/A 04/07/2017   Procedure: RADIOLOGY WITH ANESTHESIA;  Surgeon: Lisbeth Renshaw, MD;  Location: [facility] OR;  Service: Radiology;  Laterality: N/A;  . REPLACEMENT TOTAL KNEE BILATERAL  2004  . TEMPOROMANDIBULAR JOINT SURGERY     2 surgeries  . TUMOR REMOVAL    . VAGOTOMY  01/23/2012   Procedure: VAGOTOMY, antrectomy and BII;  Surgeon: Currie Paris, MD;  Location: [facility] OR;  Service: General;  Laterality: N/A;  Laparotomy with vagotomy.    There were no vitals filed for this visit.  Subjective Assessment - 12/11/17 1455    Subjective  No falls since previous session.  Leg pain is decreased to a 4/10 but had to take Lyrica today; feels more sleepy.  When she does a lot of walking her pain increases significantly.      Patient is accompained by:  Family member mom    Limitations  Standing;Walking    Currently in Pain?  Yes    Pain Score  4     Pain Location  Leg    Pain Orientation  Right    Pain Descriptors / Indicators  Discomfort;Burning    Pain Type  Neuropathic pain    Pain Onset  More than a month ago  [facility] Adult PT Treatment/Exercise - 12/11/17 1644      Ambulation/Gait   Ambulation/Gait  Yes    Ambulation/Gait Assistance  5: Supervision    Ambulation/Gait Assistance Details  focus on carry over of gait sequence from treadmill to overground with improved weight shifting, stance time RLE and improved step and stride length bilaterally    Ambulation Distance (Feet)  115 Feet    Assistive device  Rolling walker    Gait Pattern  Step-through pattern;Decreased weight shift to right;Ataxic;Trunk flexed;Narrow base of support    Ambulation Surface  Level;Indoor      Therapeutic Activites    Therapeutic Activities  ADL's    ADL's  Pt asking when she would be safe to perform a shower; has been taking baths.  Discussed with safety concerns about  a shower: slippery floor, prolonged standing, balance with eyes closed and if she were to have no UE support.  Recommended to pt performing a dry run: no water and shoes one while simulating washing half her body with one hand while other hand holds grab bar and then switching.  Pt does have hand held nozzle to wash/rinse.  Also recommended putting tread on shower floor and having husband with her to guard and remind her to maintain contact with one grab bar at all times, especially when eyes closed.      Knee/Hip Exercises: Aerobic   Tread Mill  0.9 mph x 2 reps x 2-3 minutes at a time with therapist providing verbal, visual and tactile cues for increased lateral weight shifting, proximal stability, increased stance time RLE, full step length and heel strike with LLE and decreasing circumduction of LLE.  At 0.5 mph, transitioned to RLE off side of treadmill standing stationary on step and LLE performing blocked practice of reciprocal swing phase; with LLE isolated pt able to advance LLE without circumduction and with full step length and heel strike             PT Education - 12/11/17 1639    Education provided  Yes    Education Details  ways to trial taking a shower and safety in the shower    Person(s) Educated  Patient;Parent(s)    Methods  Explanation    Comprehension  Verbalized understanding       PT Short Term Goals - 11/14/17 1715      PT SHORT TERM GOAL #1   Title  Pt will improve DGI by 4 points from initial assessment    Baseline  10/24    Time  4    Period  Weeks    Status  Revised    Target Date  12/14/17      PT SHORT TERM GOAL #2   Title  Pt will decrease falls risk with gait as indicated by increase in gait velocity with LRAD to > or = 2.0 ft/sec    Baseline  1.55 ft/sec when experiencing pain in RLE    Time  4    Period  Weeks    Status  Revised    Target Date  12/14/17      PT SHORT TERM GOAL #3   Title  Pt will decrease falls risk as indicated by increase  in BERG balance score to > or = 40/56    Baseline  34/56 when experiencing increased pain in RLE    Time  4    Period  Weeks    Status  Revised    Target Date  12/14/17  PT SHORT TERM GOAL #4   Title  Pt will ambulate >200' on indoor surfaces with cane (or no AD) and negotiate 4 stairs with one rail, alternating sequence with min A     Time  4    Period  Weeks    Status  Revised    Target Date  12/14/17        PT Long Term Goals - 11/14/17 1719      PT LONG TERM GOAL #1   Title  Pt and husband will demonstrate independence with HEP    Time  8    Period  Weeks    Status  Revised    Target Date  01/13/18      PT LONG TERM GOAL #2   Title  Pt will demonstrate improved balance and decreased falls risk as indicated by BERG balance score of > or = 45/56    Baseline   34/56 taken 12/3       Time  8    Period  Weeks    Status  Revised    Target Date  01/13/18      PT LONG TERM GOAL #3   Title  Pt will ambulate with cane (or no AD) x 300' over outdoor paved surfaces with min A; will negotiate 4 stairs with one rail with alternating sequence and supervision    Time  8    Period  Weeks    Status  Revised    Target Date  01/13/18      PT LONG TERM GOAL #4   Title  Pt will report 15% improvement in Neuro QOL-LE    Baseline  31.3%    Time  8    Period  Weeks    Status  Revised    Target Date  01/13/18      PT LONG TERM GOAL #5   Title  Pt will decrease falls risk during gait in home/community as indicated by increase in gait velocity to > or = 2.5 ft/sec with LRAD    Baseline  1.55 ft/sec on 12/3    Time  8    Period  Weeks    Status  Revised    Target Date  01/13/18      PT LONG TERM GOAL #6   Title  Pt will demonstrate decreased falls risk with gait as indicated by increase in DGI score by 8 points from initial assessment    Baseline  10/24    Time  8    Period  Weeks    Status  Revised    Target Date  01/13/18            Plan - 12/11/17 1454     Clinical Impression Statement  Treatment session continued to focus on coordination and gait training with use of treadmill during bilat reciprocal stepping and then blocked practice for LLE swing phase with focus on full weight shift onto RLE, increased proximal stability and decreasing excessive rotation causing pt to whip and circumduct LLE; pt initially able to carry over gait sequence to overground with RW but after seated rest break pt returned to LLE circumduction and ataxia.  Will continue to address.      Rehab Potential  Good    PT Frequency  2x / week    PT Duration  8 weeks    PT Treatment/Interventions  ADLs/Self Care Home Management;Aquatic Therapy;DME Instruction;Gait training;Stair training;Functional mobility training;Therapeutic activities;Therapeutic exercise;Balance training;Neuromuscular re-education;Patient/family education;Vestibular;Visual/perceptual remediation/compensation;Cognitive remediation;Orthotic Fit/Training  PT Next Visit Plan  CHECK STG.  Did she dry run a shower?  treadmill: one LE on belt-swing phase for coordination/control; gait without AD: in // bars, side stepping, retro, Physioball exercises for postural control.      Consulted and Agree with Plan of Care  Patient;Family member/caregiver    Family Member Consulted  mother       Patient will benefit from skilled therapeutic intervention in order to improve the following deficits and impairments:  Abnormal gait, Decreased balance, Decreased cognition, Decreased coordination, Decreased strength, Difficulty walking, Dizziness, Impaired sensation, Impaired vision/preception, Pain, Decreased activity tolerance, Decreased mobility  Visit Diagnosis: Ataxic gait  Other lack of coordination  Other abnormalities of gait and mobility  Unsteadiness on feet     Problem List Patient Active Problem List   Diagnosis Date Noted  . 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 Weakness with dizziness-  since [facility] and CVA  04/07/17 08/07/2017  . Disturbances of vision, late effect of stroke 08/06/2017  . Health education/counseling 08/05/2017  . High risk medications (not anticoagulants) long-term use 08/05/2017  . Neuritis-  R sided:  arm and leg/ body due to stroke 08/05/2017  . Elevated LDL cholesterol level 07/11/2017  . [employer] (NIH) Stroke Scale limb ataxia score 2, ataxia present in two limbs 06/25/2017  . 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 Elevated vitamin B12 level 05/30/2017  . Vitamin D deficiency 05/29/2017  . History of tobacco abuse-  30pk yr hx - quit 04/07/17 05/21/2017  . Gait disturbance, post-stroke 05/14/2017  . Benign essential HTN   . Vitreous hemorrhage of right eye ([name])   . Adjustment disorder with mixed anxiety and depressed mood   . 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 s/p SAH (subarachnoid hemorrhage) ([name]) 04/19/2017  . Basilar artery aneurysm ([name])   . Hypoxia   . 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 CVA (cerebral vascular accident) ([name]) 04/07/2017  . Elevated gastrin level 01/25/2012  . Hypokalemia 01/21/2012  . Nausea & vomiting 01/20/2012  . Epigastric pain 01/20/2012  . Duodenal ulcer, acute with obstruction 10/17/2011  . S/P laparoscopic cholecystectomy 10/16/2011    Dierdre Highman, PT, DPT 12/11/17    4:58 PM    [facility] [address] [place], [place], [address] Phone: [phone]   Fax:  [phone]  Name: [name] Barnett MRN: [id number] Date of Birth: [birth year]

## 2017-12-11 NOTE — Therapy (Signed)
[facility] [address] [place], [place], [address] Phone: [phone]   Fax:  [phone]  Occupational Therapy Treatment  Patient Details  Name: [name] Barnett MRN: [id number] Date of Birth: 11/02/1976 Referring Provider (Historical): Dr. Alysia Penna   Encounter Date: 12/11/2017  OT End of Session - 12/11/17 1652    Visit Number  32    Number of Visits  75    Date for OT Re-Evaluation  01/13/18    Authorization Type  BCBS, no visit limit/auth req.    Authorization Time Period  renewal completed 11/14/17 for 8 wks    OT Start Time  1405    OT Stop Time  1445    OT Time Calculation (min)  40 min    Activity Tolerance  Patient tolerated treatment well    Behavior During Therapy  [facility] for tasks assessed/performed       Past Medical History:  Diagnosis Date  . Duodenal obstruction   . GERD (gastroesophageal reflux disease)   . Hypertension   . Stroke [facility])    april 2018    Past Surgical History:  Procedure Laterality Date  . ABDOMINAL HYSTERECTOMY    . BALLOON DILATION  12/31/2011   Procedure: BALLOON DILATION;  Surgeon: Missy Sabins, MD;  Location: [facility] ENDOSCOPY;  Service: Endoscopy;  Laterality: N/A;  . CHOLECYSTECTOMY  07/28/11  . ESOPHAGOGASTRODUODENOSCOPY  10/17/2011   Procedure: ESOPHAGOGASTRODUODENOSCOPY (EGD);  Surgeon: Missy Sabins, MD;  Location: [facility] ENDOSCOPY;  Service: Endoscopy;  Laterality: N/A;  . IR ANGIO INTRA EXTRACRAN SEL INTERNAL CAROTID BILAT MOD SED  04/07/2017  . IR ANGIO VERTEBRAL SEL VERTEBRAL UNI R MOD SED  04/07/2017  . IR ANGIOGRAM FOLLOW UP STUDY  04/07/2017  . IR ANGIOGRAM FOLLOW UP STUDY  04/07/2017  . IR ANGIOGRAM FOLLOW UP STUDY  04/07/2017  . IR ANGIOGRAM FOLLOW UP STUDY  04/07/2017  . IR ANGIOGRAM FOLLOW UP STUDY  04/07/2017  . IR ANGIOGRAM SELECTIVE EACH ADDITIONAL VESSEL  04/07/2017  . IR TRANSCATH/EMBOLIZ  04/07/2017  . PARS PLANA VITRECTOMY Right 05/01/2017   Procedure: PARS  PLANA VITRECTOMY WITH 25 GAUGE RIGHT EYE, endolaser photocoaglation;  Surgeon: Jalene Mullet, MD;  Location: [facility];  Service: Ophthalmology;  Laterality: Right;  . RADIOLOGY WITH ANESTHESIA N/A 04/07/2017   Procedure: RADIOLOGY WITH ANESTHESIA;  Surgeon: Consuella Lose, MD;  Location: [facility];  Service: Radiology;  Laterality: N/A;  . REPLACEMENT TOTAL KNEE BILATERAL  2004  . TEMPOROMANDIBULAR JOINT SURGERY     2 surgeries  . TUMOR REMOVAL    . VAGOTOMY  01/23/2012   Procedure: VAGOTOMY, antrectomy and BII;  Surgeon: Haywood Lasso, MD;  Location: [facility];  Service: General;  Laterality: N/A;  Laparotomy with vagotomy.    There were no vitals filed for this visit.  Subjective Assessment - 12/11/17 1651    Subjective   t reports not feeling well today    Pertinent History  Subarachnoid hemorrhage due to ruptured aneurysm, ataxia (L cerebellar); HTN, R side decr sensation, vertical diplopia/visual deficits, ataxia, Bilateral retinal hemorhage associated with SAH (with surgery on R eye in hospital and L approx 1 month ago)    Limitations  visual deficits, fall risk, impulsivity/cognitive deficits    Patient Stated Goals  be able to walk and use L arm    Currently in Pain?  Yes    Pain Score  4     Pain Location  Leg    Pain Descriptors /  Indicators  Aching;Burning    Pain Type  Neuropathic pain    Pain Onset  More than a month ago    Pain Frequency  Constant    Aggravating Factors   standing    Pain Relieving Factors  sitting    Effect of Pain on Daily Activities  limits daily activities              Treatment: Rocking forwards and backwards in quadraped over bench, increased hip pain so transitioned to cat and cow. Seated with ball between knees, pt performed shoulder flexion closed chain with 5 lbs ball, then diagonals to each side x 10 reps with 1 Lbs ball, min-mod facilitation, for core stability, UE strength Arm bike x 6 mins level 3 for conditioning. Connect four using  LUE to play for increased attention, LUE functional use.               OT Short Term Goals - 11/14/17 1732      OT SHORT TERM GOAL #1   Title  Pt/caregiver will be independent with initial HEP.    Time  4    Period  Weeks    Status  Achieved 08/19/17:  with cueing from husband      OT [place] #2   Title  Pt will be use LUE as nondominant assist at least 50% of the time for ADLs without cueing.    Time  4    Period  Weeks    Status  Achieved 08/19/17:  approx 40% of the time.  09/19/17:  90%      OT SHORT TERM GOAL #3   Title  Pt will improve LUE coordination/control to improve score on box and blocks test by at least 6 with LUE.    Baseline  19 blocks    Time  4    Period  Weeks    Status  Achieved 08/22/17:  18, 20 blocks.  09/19/17:  25 blocks      OT SHORT TERM GOAL #4   Title  Pt will perform simple home maintenance task/snack prep in sitting with set-up.    Time  4    Period  Weeks    Status  Achieved 08/19/17:  met in clinic with set-up and cueing/prompts for encouragement/to slow down      OT SHORT TERM GOAL #5   Title  Pt/husband will verbalize understanding of visual compensation stategies for improved safety/incr ease with ADLs.    Time  4    Period  Weeks    Status  Achieved      OT SHORT TERM GOAL #6   Title  Pt will perform simple environmental scanning/navigation with CGA.--check STGs 10/18/17    Time  4    Period  Weeks    Status  Achieved 10/16/17:  met at approx this level      OT SHORT TERM GOAL #7   Title  Pt will complete functional task with LUE with no more than min cueing for impulsivity.--check STGs 12/14/16    Time  4    Period  Weeks    Status  On-going 10/16/17:  min-mod cueing; 11/14/17 inconsistent depending on task/frustration level    Target Date  12/14/17      OT SHORT TERM GOAL #8   Title  Pt will perform simple home maintenance task/snack prep in standing with set-up/supervision and no more than 1 v.c. for safety.    Time   4  Period  Weeks    Status  New    Target Date  12/14/17      OT SHORT TERM GOAL  #9   TITLE  Pt will improve coordination for ADLs as shown by completing 9-hole peg test in less than 44mn 30sec.    Baseline  267m 55.44sec    Time  4    Period  Weeks    Status  New    Target Date  12/14/17        OT Long Term Goals - 11/14/17 1734      OT LONG TERM GOAL #1   Title  Pt/caregiver will be independent with updated HEP.--check LTGs 01/13/18    Time  8    Period  Weeks    Status  On-going 11/14/17:  needs further updates    Target Date  01/13/18      OT LONG TERM GOAL #2   Title  Pt will be use LUE as nondominant assist at least 75% of the time for ADLs without cueing.    Time  8    Period  Weeks    Status  Achieved uses 75% x however needs prompting at times.  09/19/17:  met per pt  90%      OT LONG TERM GOAL #3   Title  Pt will perform toileting mod I (including transfer).    Time  8    Period  Weeks    Status  Achieved supervision.  09/19/17:  continues to need superivsion for mobility.  11/05/17  met      OT LONG TERM GOAL #4   Title  Pt will improve LUE coordination/control to improve score on box and blocks to at least 27 with LUE in 2 trials.--revised 11/14/17    Baseline  19 blocks    Time  8    Period  Weeks    Status  Revised 18, 19 blocks.  09/19/17:  25 blocks.  11/14/17:  19 blocks/inconsistent with frustration level      OT LONG TERM GOAL #5   Title  Pt will perform simple home maintenance task/snack prep in standing mod I.--updated 11/14/17    Time  8    Period  Weeks    Status  Revised Pt is not perfoming consistely, she has made a sandwich in seated.  11/14/17:  approximating, supervision/min A and cueing      OT LONG TERM GOAL #6   Title  Pt will perform simple environmental scanning/navigation with mod I.--revised 11/14/17    Time  8    Period  Weeks    Status  Revised 11/05/17  min cueing and supervision for mobility, environmental scanning with good  accuracy      OT LONG TERM GOAL #7   Title  Pt will improve coordination for ADLs as shown by completing 9-hole peg test in less than 41m55m    Baseline  41mi30m.44sec    Time  8    Period  Weeks    Status  New            Plan - 12/11/17 1653    Clinical Impression Statement  Pt is progressing towards goals for LUE functional use. she remains limited in mobility by LE pain    Occupational Profile and client history currently impacting functional performance  Pt is a 41 y35. female s/p subarachnoid hemorrhage due to ruptured aneurysm, L cerebellar infarct with ataxia.  Pt was working full time and indpendent  prior to hospitalization.  Pt lives with husband and cared for 59 y.o. daughter.  Medical history includes HTN, GERD, and Bilateral retinal hemorhage associated with SAH (needed surgery).      Rehab Potential  Good    Current Impairments/barriers affecting progress:  cognitive deficits, impulsivity    OT Frequency  2x / week    OT Duration  8 weeks    OT Treatment/Interventions  Self-care/ADL training;Therapeutic exercise;DME and/or AE instruction;Therapeutic activities;Cognitive remediation/compensation;Visual/perceptual remediation/compensation;Passive range of motion;Functional Mobility Training;Neuromuscular education;Cryotherapy;Paraffin;Energy conservation;Manual Therapy;Patient/family education;Ultrasound;Balance training;Moist Heat;Fluidtherapy    Plan  continue with simple home maintenance tasks, LUE functional use    OT Home Exercise Plan  Education provided:  HEP for LUE control, coordination, and functional use    Consulted and Agree with Plan of Care  Patient;Family member/caregiver    Family Member Consulted  mother       Patient will benefit from skilled therapeutic intervention in order to improve the following deficits and impairments:  Decreased coordination, Decreased range of motion, Difficulty walking, Abnormal gait, Decreased safety awareness, Impaired sensation,  Decreased knowledge of precautions, Decreased balance, Decreased knowledge of use of DME, Impaired UE functional use, Decreased cognition, Decreased mobility, Decreased strength, Impaired vision/preception, Impaired perceived functional ability  Visit Diagnosis: Other lack of coordination  Attention and concentration deficit  Visuospatial deficit  Other symptoms and signs involving cognitive functions following nontraumatic subarachnoid hemorrhage  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 Weakness with dizziness-  since [facility] and CVA 04/07/17 08/07/2017  . Disturbances of vision, late effect of stroke 08/06/2017  . Health education/counseling 08/05/2017  . High risk medications (not anticoagulants) long-term use 08/05/2017  . Neuritis-  R sided:  arm and leg/ body due to stroke 08/05/2017  . Elevated LDL cholesterol level 07/11/2017  . [employer] (NIH) Stroke Scale limb ataxia score 2, ataxia present in two limbs 06/25/2017  . 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 Elevated vitamin B12 level 05/30/2017  . Vitamin D deficiency 05/29/2017  . History of tobacco abuse-  30pk yr hx - quit 04/07/17 05/21/2017  . Gait disturbance, post-stroke 05/14/2017  . Benign essential HTN   . Vitreous hemorrhage of right eye ([place])   . Adjustment disorder with mixed anxiety and depressed mood   . 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 s/p SAH (subarachnoid hemorrhage) ([place]) 04/19/2017  . 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 Hypoxia   . Subarachnoid hemorrhage due to ruptured aneurysm ([place]) 04/07/2017  . CVA (cerebral vascular accident) ([place]) 04/07/2017  . Elevated gastrin level 01/25/2012  . Hypokalemia 01/21/2012  .  Nausea & vomiting 01/20/2012  . Epigastric pain 01/20/2012  . Duodenal ulcer, acute with obstruction 10/17/2011  . S/P laparoscopic cholecystectomy 10/16/2011    RINE,KATHRYN 12/11/2017, 4:54 PM  [facility] [address] [place], [place], [address] Phone: [phone]   Fax:  [phone]  Name: [name] Barnett MRN: [id number] Date of Birth: 21-Jan-1976

## 2017-12-13 ENCOUNTER — Ambulatory Visit: Payer: BLUE CROSS/BLUE SHIELD | Admitting: Occupational Therapy

## 2017-12-13 ENCOUNTER — Ambulatory Visit: Payer: BLUE CROSS/BLUE SHIELD | Admitting: Physical Therapy

## 2017-12-13 ENCOUNTER — Encounter: Payer: Self-pay | Admitting: Physical Therapy

## 2017-12-13 DIAGNOSIS — R2681 Unsteadiness on feet: Secondary | ICD-10-CM

## 2017-12-13 DIAGNOSIS — R278 Other lack of coordination: Secondary | ICD-10-CM

## 2017-12-13 DIAGNOSIS — R41842 Visuospatial deficit: Secondary | ICD-10-CM

## 2017-12-13 DIAGNOSIS — I69054 Hemiplegia and hemiparesis following nontraumatic subarachnoid hemorrhage affecting left non-dominant side: Secondary | ICD-10-CM

## 2017-12-13 DIAGNOSIS — R41840 Attention and concentration deficit: Secondary | ICD-10-CM

## 2017-12-13 DIAGNOSIS — I69018 Other symptoms and signs involving cognitive functions following nontraumatic subarachnoid hemorrhage: Secondary | ICD-10-CM

## 2017-12-13 DIAGNOSIS — R260 Ataxic gait: Secondary | ICD-10-CM

## 2017-12-13 DIAGNOSIS — R2689 Other abnormalities of gait and mobility: Secondary | ICD-10-CM

## 2017-12-13 NOTE — Therapy (Signed)
[facility] [address] [place], [place], [address] Phone: [phone]   Fax:  [phone]  Occupational Therapy Treatment  Patient Details  Name: [name] Barnett MRN: [id number] Date of Birth: [birth year] Referring Provider (Historical): Dr. Alysia Penna   Encounter Date: 12/13/2017  OT End of Session - 12/13/17 1711    Visit Number  33    Number of Visits  42    Date for OT Re-Evaluation  01/13/18    Authorization Type  BCBS, no visit limit/auth req.    Authorization Time Period  renewal completed 11/14/17 for 8 wks    OT Start Time  1450    OT Stop Time  1530    OT Time Calculation (min)  40 min    Activity Tolerance  Patient tolerated treatment well    Behavior During Therapy  WFL for tasks assessed/performed       Past Medical History:  Diagnosis Date  . Duodenal obstruction   . GERD (gastroesophageal reflux disease)   . Hypertension   . Stroke [facility])    april 2018    Past Surgical History:  Procedure Laterality Date  . ABDOMINAL HYSTERECTOMY    . BALLOON DILATION  12/31/2011   Procedure: BALLOON DILATION;  Surgeon: Missy Sabins, MD;  Location: [facility] ENDOSCOPY;  Service: Endoscopy;  Laterality: N/A;  . CHOLECYSTECTOMY  07/28/11  . ESOPHAGOGASTRODUODENOSCOPY  10/17/2011   Procedure: ESOPHAGOGASTRODUODENOSCOPY (EGD);  Surgeon: Missy Sabins, MD;  Location: [facility] ENDOSCOPY;  Service: Endoscopy;  Laterality: N/A;  . IR ANGIO INTRA EXTRACRAN SEL INTERNAL CAROTID BILAT MOD SED  04/07/2017  . IR ANGIO VERTEBRAL SEL VERTEBRAL UNI R MOD SED  04/07/2017  . IR ANGIOGRAM FOLLOW UP STUDY  04/07/2017  . IR ANGIOGRAM FOLLOW UP STUDY  04/07/2017  . IR ANGIOGRAM FOLLOW UP STUDY  04/07/2017  . IR ANGIOGRAM FOLLOW UP STUDY  04/07/2017  . IR ANGIOGRAM FOLLOW UP STUDY  04/07/2017  . IR ANGIOGRAM SELECTIVE EACH ADDITIONAL VESSEL  04/07/2017  . IR TRANSCATH/EMBOLIZ  04/07/2017  . PARS PLANA VITRECTOMY Right 05/01/2017   Procedure: PARS  PLANA VITRECTOMY WITH 25 GAUGE RIGHT EYE, endolaser photocoaglation;  Surgeon: Jalene Mullet, MD;  Location: [facility];  Service: Ophthalmology;  Laterality: Right;  . RADIOLOGY WITH ANESTHESIA N/A 04/07/2017   Procedure: RADIOLOGY WITH ANESTHESIA;  Surgeon: Consuella Lose, MD;  Location: [facility] [place];  Service: Radiology;  Laterality: N/A;  . REPLACEMENT TOTAL KNEE BILATERAL  2004  . TEMPOROMANDIBULAR JOINT SURGERY     2 surgeries  . TUMOR REMOVAL    . VAGOTOMY  01/23/2012   Procedure: VAGOTOMY, antrectomy and BII;  Surgeon: Haywood Lasso, MD;  Location: [facility];  Service: General;  Laterality: N/A;  Laparotomy with vagotomy.    There were no vitals filed for this visit.  Subjective Assessment - 12/13/17 1720    Subjective   Reports leg pain    Pertinent History  Subarachnoid hemorrhage due to ruptured aneurysm, ataxia (L cerebellar); HTN, R side decr sensation, vertical diplopia/visual deficits, ataxia, Bilateral retinal hemorhage associated with SAH (with surgery on R eye in hospital and L approx 1 month ago)    Limitations  visual deficits, fall risk, impulsivity/cognitive deficits    Currently in Pain?  Yes    Pain Score  4     Pain Location  Leg    Pain Orientation  Right    Pain Descriptors / Indicators  Aching    Pain Type  Neuropathic pain  Pain Onset  More than a month ago    Pain Frequency  Constant    Aggravating Factors   standing     Pain Relieving Factors  sitting         Treatment: Standing to rock side to side and forwards / backwards at tabletop with therapist facilitating weight shift over left leg, mod facilitation Seated placing connect 4 pieces in frame with LUE, mod v.c for weight shift to left and for LUE use. Therapist checked short term goals, see above.                     OT Short Term Goals - 12/13/17 1504      OT SHORT TERM GOAL #4   Title  Pt will perform simple home maintenance task/snack prep in sitting with set-up.    Status   Achieved      OT SHORT TERM GOAL #5   Title  Pt/husband will verbalize understanding of visual compensation stategies for improved safety/incr ease with ADLs.    Status  Achieved      OT SHORT TERM GOAL #6   Title  Pt will perform simple environmental scanning/navigation with CGA.--check STGs 10/18/17    Status  Achieved      OT SHORT TERM GOAL #7   Title  Pt will complete functional task with LUE with no more than min cueing for impulsivity.--check STGs 12/14/16    Status  On-going inconsistent      OT SHORT TERM GOAL #8   Title  Pt will perform simple home maintenance task/snack prep in standing with set-up/supervision and no more than 1 v.c. for safety.    Status  Achieved      OT SHORT TERM GOAL  #9   TITLE  Pt will improve coordination for ADLs as shown by completing 9-hole peg test in less than 53mn 30sec.    Status  On-going 2 mins 43 secs        OT Long Term Goals - 11/14/17 1734      OT LONG TERM GOAL #1   Title  Pt/caregiver will be independent with updated HEP.--check LTGs 01/13/18    Time  8    Period  Weeks    Status  On-going 11/14/17:  needs further updates    Target Date  01/13/18      OT LONG TERM GOAL #2   Title  Pt will be use LUE as nondominant assist at least 75% of the time for ADLs without cueing.    Time  8    Period  Weeks    Status  Achieved uses 75% x however needs prompting at times.  09/19/17:  met per pt  90%      OT LONG TERM GOAL #3   Title  Pt will perform toileting mod I (including transfer).    Time  8    Period  Weeks    Status  Achieved supervision.  09/19/17:  continues to need superivsion for mobility.  11/05/17  met      OT LONG TERM GOAL #4   Title  Pt will improve LUE coordination/control to improve score on box and blocks to at least 27 with LUE in 2 trials.--revised 11/14/17    Baseline  19 blocks    Time  8    Period  Weeks    Status  Revised 18, 19 blocks.  09/19/17:  25 blocks.  11/14/17:  19 blocks/inconsistent with  frustration level  OT LONG TERM GOAL #5   Title  Pt will perform simple home maintenance task/snack prep in standing mod I.--updated 11/14/17    Time  8    Period  Weeks    Status  Revised Pt is not perfoming consistely, she has made a sandwich in seated.  11/14/17:  approximating, supervision/min A and cueing      OT LONG TERM GOAL #6   Title  Pt will perform simple environmental scanning/navigation with mod I.--revised 11/14/17    Time  8    Period  Weeks    Status  Revised 11/05/17  min cueing and supervision for mobility, environmental scanning with good accuracy      OT LONG TERM GOAL #7   Title  Pt will improve coordination for ADLs as shown by completing 9-hole peg test in less than 25mn.    Baseline  220m 5.44sec    Time  8    Period  Weeks    Status  New            Plan - 12/13/17 1712    Clinical Impression Statement  Pt is progressing towards goals for LUE functional use. Pt progress has been impeded by LE pain and depressed mood.    Rehab Potential  Good    OT Frequency  2x / week    OT Duration  8 weeks    OT Treatment/Interventions  Self-care/ADL training;Therapeutic exercise;DME and/or AE instruction;Therapeutic activities;Cognitive remediation/compensation;Visual/perceptual remediation/compensation;Passive range of motion;Functional Mobility Training;Neuromuscular education;Cryotherapy;Paraffin;Energy conservation;Manual Therapy;Patient/family education;Ultrasound;Balance training;Moist Heat;Fluidtherapy    Plan  continue with simple home management,     Consulted and Agree with Plan of Care  Patient;Family member/caregiver    Family Member Consulted  mother       Patient will benefit from skilled therapeutic intervention in order to improve the following deficits and impairments:  Decreased coordination, Decreased range of motion, Difficulty walking, Abnormal gait, Decreased safety awareness, Impaired sensation, Decreased knowledge of precautions, Decreased  balance, Decreased knowledge of use of DME, Impaired UE functional use, Decreased cognition, Decreased mobility, Decreased strength, Impaired vision/preception, Impaired perceived functional ability  Visit Diagnosis: Other lack of coordination  Attention and concentration deficit  Visuospatial deficit  Other symptoms and signs involving cognitive functions following nontraumatic subarachnoid hemorrhage  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 [facility]  [facility] 08/[facility]  . Disturbances of vision, late effect of stroke 08/06/2017  . Health education/counseling 08/05/2017  . High risk medications (not anticoagulants) long-term use 08/05/2017  . Neuritis-  R sided:  arm and leg/ body due to stroke 08/05/2017  . Elevated LDL cholesterol level 07/11/2017  . [employer] (NIH) Stroke Scale limb ataxia score 2, ataxia present in two limbs 06/25/2017  . 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 Elevated vitamin B12 level 05/30/2017  . Vitamin D deficiency 05/29/2017  . History of tobacco abuse-  30pk yr hx - quit 04/07/17 05/21/2017  . Gait disturbance, post-stroke 05/14/2017  . Benign essential HTN   . Vitreous hemorrhage of right eye ([facility]  . Adjustment disorder with mixed anxiety and depressed mood   . 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 Terson syndrome of both eyes ([place]05/15/2018  . s/p SAH (subarachnoid hemorrhage) ([facility]05/10/2017  . Basilar artery  aneurysm ([place])   . Hypoxia   . Subarachnoid hemorrhage due to ruptured aneurysm ([place]) 04/07/2017  . CVA (cerebral vascular accident) ([place]) 04/07/2017  . Elevated gastrin level 01/25/2012  . Hypokalemia 01/21/2012  . Nausea & vomiting 01/20/2012  . Epigastric  pain 01/20/2012  . Duodenal ulcer, acute with obstruction 10/17/2011  . S/P laparoscopic cholecystectomy 10/16/2011    Dorena Dorfman 12/13/2017, 5:21 PM  [facility] [address] [place], [place], [address] Phone: [phone]   Fax:  [phone]  Name: DAKIA SCHIFANO MRN: [id number] Date of Birth: 05-18-1976

## 2017-12-14 NOTE — Therapy (Signed)
[facility] [address] [place], [place], [address] Phone: [phone]   Fax:  [phone]  Physical Therapy Treatment  Patient Details  Name: [name] Barnett MRN: [id number] Date of Birth: October 28, 1976 Referring Provider: Charlett Blake, MD   Encounter Date: 12/13/2017  PT End of Session - 12/14/17 1108    Visit Number  38    Number of Visits  46    Date for PT Re-Evaluation  01/13/18    Authorization Type  BCBS    PT Start Time  1530    PT Stop Time  1615    PT Time Calculation (min)  45 min    Activity Tolerance  Patient tolerated treatment well    Behavior During Therapy  [facility] for tasks assessed/performed       Past Medical History:  Diagnosis Date  . Duodenal obstruction   . GERD (gastroesophageal reflux disease)   . Hypertension   . Stroke [facility])    april 2018    Past Surgical History:  Procedure Laterality Date  . ABDOMINAL HYSTERECTOMY    . BALLOON DILATION  12/31/2011   Procedure: BALLOON DILATION;  Surgeon: Missy Sabins, MD;  Location[facility] END[facility]COPY;  Service: Endoscopy;  Laterality: N/A;  . CHOLECYSTECTOMY  07/28/11  . ESOPHAGOGASTRODUODENOSCOPY  10/17/2011   Procedure: ESOPHAGOGASTRODUODENOSCOPY (EGD);  Surgeon: Missy Sabins, MD;  Location: [facility] ENDOSCOPY;  Service: Endoscopy;  Laterality: N/A;  . IR ANGIO INTRA EXTRACRAN SEL INTERNAL CAROTID BILAT MOD SED  04/07/2017  . IR ANGIO VERTEBRAL SEL VERTEBRAL UNI R MOD SED  04/07/2017  . IR ANGIOGRAM FOLLOW UP STUDY  04/07/2017  . IR ANGIOGRAM FOLLOW UP STUDY  04/07/2017  . IR ANGIOGRAM FOLLOW UP STUDY  04/07/2017  . IR ANGIOGRAM FOLLOW UP STUDY  04/07/2017  . IR ANGIOGRAM FOLLOW UP STUDY  04/07/2017  . IR ANGIOGRAM SELECTIVE EACH ADDITIONAL VESSEL  04/07/2017  . IR TRANSCATH/EMBOLIZ  04/07/2017  . PARS PLANA VITRECTOMY Right 05/01/2017   Procedure: PARS PLANA VITRECTOMY WITH 25 GAUGE RIGHT EYE, endolaser photocoaglation;  Surgeon: Jalene Mullet, MD;  Location:  [facility];  Service: Ophthalmology;  Laterality: Right;  . RADIOLOGY WITH ANESTHESIA N/A 04/07/2017   Procedure: RADIOLOGY WITH ANESTHESIA;  Surgeon: Consuella Lose, MD;  Location: [facility];  Service: Radiology;  Laterality: N/A;  . REPLACEMENT TOTAL KNEE BILATERAL  2004  . TEMPOROMANDIBULAR JOINT SURGERY     2 surgeries  . TUMOR REMOVAL    . VAGOTOMY  01/23/2012   Procedure: VAGOTOMY, antrectomy and BII;  Surgeon: Haywood Lasso, MD;  Location: [facility];  Service: General;  Laterality: N/A;  Laparotomy with vagotomy.    There were no vitals filed for this visit.  Subjective Assessment - 12/13/17 1537    Subjective  Pt was very tired after session with treadmill, went home and slept well.  Today RLE is sore but not as painful as before.  Did not get to try a dry run in the shower.      Patient is accompained by:  Family member mom    Limitations  Standing;Walking    Currently in Pain?  Yes    Pain Onset  More than a month ago         [facility] PT Assessment - 12/13/17 1541      Standardized Balance Assessment   Standardized Balance Assessment  Berg Balance Test;Dynamic Gait Index;10 meter walk test    10 Meter Walk  17.8 seconds or 1.8 ft/sec  with RW      Berg Balance Test   Sit to Stand  Able to stand without using hands and stabilize independently    Standing Unsupported  Able to stand safely 2 minutes    Sitting with Back Unsupported but Feet Supported on Floor or Stool  Able to sit safely and securely 2 minutes    Stand to Sit  Sits safely with minimal use of hands    Transfers  Able to transfer safely, definite need of hands    Standing Unsupported with Eyes Closed  Able to stand 10 seconds with supervision    Standing Ubsupported with Feet Together  Able to place feet together independently and stand for 1 minute with supervision    From Standing, Reach Forward with Outstretched Arm  Can reach confidently >25 cm (10")    From Standing Position, Pick up Object from [place] to  pick up shoe, needs supervision    From Standing Position, Turn to Look Behind Over each Shoulder  Looks behind from both sides and weight shifts well    Turn 360 Degrees  Needs close supervision or verbal cueing    Standing Unsupported, Alternately Place Feet on Step/Stool  Able to complete >2 steps/needs minimal assist    Standing Unsupported, One Foot in Front  Able to take small step independently and hold 30 seconds    Standing on One Leg  Able to lift leg independently and hold 5-10 seconds    Total Score  43    Berg comment:  43/56      Dynamic Gait Index   Level Surface  Moderate Impairment    Change in Gait Speed  Mild Impairment    Gait with Horizontal Head Turns  Mild Impairment    Gait with Vertical Head Turns  Mild Impairment    Gait and Pivot Turn  Moderate Impairment    Step Over Obstacle  Moderate Impairment    Step Around Obstacles  Moderate Impairment    Steps  Moderate Impairment    Total Score  11    DGI comment:  11/24                          PT Education - 12/14/17 1117    Education provided  Yes    Education Details  ways to perform SLS training in the kitchen at counter placing elevated foot on bottom cabinet shelf (modified SLS) so that pt does not have to perform on stairs in garage.    Person(s) Educated  Patient;Parent(s)    Methods  Explanation    Comprehension  Verbalized understanding       PT Short Term Goals - 12/13/17 1540      PT SHORT TERM GOAL #1   Title  Pt will improve DGI by 4 points from initial assessment    Baseline  10/24, 11/24    Time  4    Period  Weeks    Status  Not Met      PT SHORT TERM GOAL #2   Title  Pt will decrease falls risk with gait as indicated by increase in gait velocity with LRAD to > or = 2.0 ft/sec    Baseline  1.55 ft/sec when experiencing pain in RLE, 1.8 ft/sec RW    Time  4    Period  Weeks    Status  Partially Met      PT SHORT TERM GOAL #3  Title  Pt will decrease falls risk as  indicated by increase in BERG balance score to > or = 40/56    Baseline  34/56 when experiencing increased pain in RLE, 43/56 on 12/13/17    Time  4    Period  Weeks    Status  Achieved      PT SHORT TERM GOAL #4   Title  Pt will ambulate >200' on indoor surfaces with cane (or no AD) and negotiate 4 stairs with one rail, alternating sequence with min A     Time  4    Period  Weeks    Status  On-going        PT Long Term Goals - 11/14/17 1719      PT LONG TERM GOAL #1   Title  Pt and husband will demonstrate independence with HEP    Time  8    Period  Weeks    Status  Revised    Target Date  01/13/18      PT LONG TERM GOAL #2   Title  Pt will demonstrate improved balance and decreased falls risk as indicated by BERG balance score of > or = 45/56    Baseline   34/56 taken 12/3       Time  8    Period  Weeks    Status  Revised    Target Date  01/13/18      PT LONG TERM GOAL #3   Title  Pt will ambulate with cane (or no AD) x 300' over outdoor paved surfaces with min A; will negotiate 4 stairs with one rail with alternating sequence and supervision    Time  8    Period  Weeks    Status  Revised    Target Date  01/13/18      PT LONG TERM GOAL #4   Title  Pt will report 15% improvement in Neuro QOL-LE    Baseline  31.3%    Time  8    Period  Weeks    Status  Revised    Target Date  01/13/18      PT LONG TERM GOAL #5   Title  Pt will decrease falls risk during gait in home/community as indicated by increase in gait velocity to > or = 2.5 ft/sec with LRAD    Baseline  1.55 ft/sec on 12/3    Time  8    Period  Weeks    Status  Revised    Target Date  01/13/18      PT LONG TERM GOAL #6   Title  Pt will demonstrate decreased falls risk with gait as indicated by increase in DGI score by 8 points from initial assessment    Baseline  10/24    Time  8    Period  Weeks    Status  Revised    Target Date  01/13/18            Plan - 12/14/17 1109    Clinical  Impression Statement  Pt experiencing decreased pain today; initiated assessment of progress towards STG.  Pt has met 1/4 STG with improvement in overall standing balance but continues to experience dizziness with 360 deg turns and impaired ability to fully shift her weight and maintain weight over unilat LE during single limb stance for step taps and SLS; pt partially met gait velocity goal with increase to 1.41f/sec with RW but only improved DGI score by 1 point.  Will assess progress towards final STG #4 at next session if pt is able to tolerate.  Will continue to progress towards LTG.    Rehab Potential  Good    PT Frequency  2x / week    PT Duration  8 weeks    PT Treatment/Interventions  ADLs/Self Care Home Management;Aquatic Therapy;DME Instruction;Gait training;Stair training;Functional mobility training;Therapeutic activities;Therapeutic exercise;Balance training;Neuromuscular re-education;Patient/family education;Vestibular;Visual/perceptual remediation/compensation;Cognitive remediation;Orthotic Fit/Training    PT Next Visit Plan  Practice and possibly check off STG #4 - gait with cane, stairs with one rail.  Did she dry run a shower?  treadmill: one LE on belt-swing phase for coordination/control; gait without AD: in // bars, side stepping, retro, Physioball exercises for postural control.      Consulted and Agree with Plan of Care  Patient;Family member/caregiver    Family Member Consulted  mother       Patient will benefit from skilled therapeutic intervention in order to improve the following deficits and impairments:  Abnormal gait, Decreased balance, Decreased cognition, Decreased coordination, Decreased strength, Difficulty walking, Dizziness, Impaired sensation, Impaired vision/preception, Pain, Decreased activity tolerance, Decreased mobility  Visit Diagnosis: Ataxic gait  Other abnormalities of gait and mobility  Other lack of coordination  Unsteadiness on  feet     Problem List Patient Active Problem List   Diagnosis Date Noted  . 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 Weakness with dizziness-  since [facility] and CVA 04/07/17 08/07/2017  . Disturbances of vision, late effect of stroke 08/06/2017  . Health education/counseling 08/05/2017  . High risk medications (not anticoagulants) long-term use 08/05/2017  . Neuritis-  R sided:  arm and leg/ body due to stroke 08/05/2017  . Elevated LDL cholesterol level 07/11/2017  . [employer] (NIH) Stroke Scale limb ataxia score 2, ataxia present in two limbs 06/25/2017  . 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 Elevated vitamin B12 level 05/30/2017  . Vitamin D deficiency 05/29/2017  . History of tobacco abuse-  30pk yr hx - quit 04/07/17 05/21/2017  . Gait disturbance, post-stroke 05/14/2017  . Benign essential HTN   . Vitreous hemorrhage of right eye ([place])   . Adjustment disorder with mixed anxiety and depressed mood   . 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 s/p SAH (subarachnoid hemorrhage) ([place]) 04/19/2017  . 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 Hypoxia   . Subarachnoid hemorrhage due to ruptured aneurysm ([place]) 04/07/2017  . CVA (cerebral vascular accident) ([place]) 04/07/2017  . Elevated gastrin level 01/25/2012  . Hypokalemia 01/21/2012  . Nausea & vomiting 01/20/2012  . Epigastric pain 01/20/2012  . Duodenal ulcer, acute with obstruction 10/17/2011  . S/P laparoscopic cholecystectomy 10/16/2011    Rico Junker, PT, DPT 12/14/17    11:19 AM    [facility] [address] [place], [place], [address] Phone: [phone]   Fax:  [phone]  Name: [name] Barnett MRN: [id number] Date of Birth: [birth year]

## 2017-12-17 ENCOUNTER — Ambulatory Visit: Payer: BLUE CROSS/BLUE SHIELD | Admitting: Physical Therapy

## 2017-12-17 ENCOUNTER — Encounter: Payer: Self-pay | Admitting: Physical Therapy

## 2017-12-17 ENCOUNTER — Encounter: Payer: Self-pay | Admitting: Occupational Therapy

## 2017-12-17 ENCOUNTER — Ambulatory Visit: Payer: BLUE CROSS/BLUE SHIELD | Admitting: Occupational Therapy

## 2017-12-17 DIAGNOSIS — R2681 Unsteadiness on feet: Secondary | ICD-10-CM

## 2017-12-17 DIAGNOSIS — R2689 Other abnormalities of gait and mobility: Secondary | ICD-10-CM

## 2017-12-17 DIAGNOSIS — R278 Other lack of coordination: Secondary | ICD-10-CM

## 2017-12-17 DIAGNOSIS — R41842 Visuospatial deficit: Secondary | ICD-10-CM

## 2017-12-17 DIAGNOSIS — R208 Other disturbances of skin sensation: Secondary | ICD-10-CM

## 2017-12-17 DIAGNOSIS — I69054 Hemiplegia and hemiparesis following nontraumatic subarachnoid hemorrhage affecting left non-dominant side: Secondary | ICD-10-CM

## 2017-12-17 DIAGNOSIS — R260 Ataxic gait: Secondary | ICD-10-CM

## 2017-12-17 DIAGNOSIS — R414 Neurologic neglect syndrome: Secondary | ICD-10-CM

## 2017-12-17 DIAGNOSIS — R41840 Attention and concentration deficit: Secondary | ICD-10-CM

## 2017-12-17 DIAGNOSIS — I69018 Other symptoms and signs involving cognitive functions following nontraumatic subarachnoid hemorrhage: Secondary | ICD-10-CM

## 2017-12-17 DIAGNOSIS — R270 Ataxia, unspecified: Secondary | ICD-10-CM

## 2017-12-17 NOTE — Therapy (Signed)
[facility] [address] [place], [place], [address] Phone: [phone]   Fax:  [phone]  Occupational Therapy Treatment  Patient Details  Name: [name] Barnett MRN: [id number] Date of Birth: Jul 15, 1976 Referring Provider (Historical): Dr. Alysia Penna   Encounter Date: 12/17/2017  OT End of Session - 12/17/17 1713    Visit Number  34    Number of Visits  75    Date for OT Re-Evaluation  01/13/18    Authorization Type  BCBS, no visit limit/auth req.    Authorization Time Period  renewal completed 11/14/17 for 8 wks    OT Start Time  1445    OT Stop Time  1538    OT Time Calculation (min)  53 min    Activity Tolerance  Patient tolerated treatment well    Behavior During Therapy  [facility] for tasks assessed/performed       Past Medical History:  Diagnosis Date  . Duodenal obstruction   . GERD (gastroesophageal reflux disease)   . Hypertension   . Stroke [facility])    april 2018    Past Surgical History:  Procedure Laterality Date  . ABDOMINAL HYSTERECTOMY    . BALLOON DILATION  12/31/2011   Procedure: BALLOON DILATION;  Surgeon: Missy Sabins, MD;  Location: [facility] ENDOSCOPY;  Service: Endoscopy;  Laterality: N/A;  . CHOLECYSTECTOMY  07/28/11  . ESOPHAGOGASTRODUODENOSCOPY  10/17/2011   Procedure: ESOPHAGOGASTRODUODENOSCOPY (EGD);  Surgeon: Missy Sabins, MD;  Location: [facility] ENDOSCOPY;  Service: Endoscopy;  Laterality: N/A;  . IR ANGIO INTRA EXTRACRAN SEL INTERNAL CAROTID BILAT MOD SED  04/07/2017  . IR ANGIO VERTEBRAL SEL VERTEBRAL UNI R MOD SED  04/07/2017  . IR ANGIOGRAM FOLLOW UP STUDY  04/07/2017  . IR ANGIOGRAM FOLLOW UP STUDY  04/07/2017  . IR ANGIOGRAM FOLLOW UP STUDY  04/07/2017  . IR ANGIOGRAM FOLLOW UP STUDY  04/07/2017  . IR ANGIOGRAM FOLLOW UP STUDY  04/07/2017  . IR ANGIOGRAM SELECTIVE EACH ADDITIONAL VESSEL  04/07/2017  . IR TRANSCATH/EMBOLIZ  04/07/2017  . PARS PLANA VITRECTOMY Right 05/01/2017   Procedure: PARS  PLANA VITRECTOMY WITH 25 GAUGE RIGHT EYE, endolaser photocoaglation;  Surgeon: Jalene Mullet, MD;  Location: [facility];  Service: Ophthalmology;  Laterality: Right;  . RADIOLOGY WITH ANESTHESIA N/A 04/07/2017   Procedure: RADIOLOGY WITH ANESTHESIA;  Surgeon: Consuella Lose, MD;  Location: [facility];  Service: Radiology;  Laterality: N/A;  . REPLACEMENT TOTAL KNEE BILATERAL  2004  . TEMPOROMANDIBULAR JOINT SURGERY     2 surgeries  . TUMOR REMOVAL    . VAGOTOMY  01/23/2012   Procedure: VAGOTOMY, antrectomy and BII;  Surgeon: Haywood Lasso, MD;  Location: [facility];  Service: General;  Laterality: N/A;  Laparotomy with vagotomy.    There were no vitals filed for this visit.  Subjective Assessment - 12/17/17 1448    Subjective   nerve blocks Friday.  "I've been lazy today"    Pertinent History  Subarachnoid hemorrhage due to ruptured aneurysm, ataxia (L cerebellar); HTN, R side decr sensation, vertical diplopia/visual deficits, ataxia, Bilateral retinal hemorhage associated with SAH (with surgery on R eye in hospital and L approx 1 month ago)    Limitations  visual deficits, fall risk, impulsivity/cognitive deficits    Patient Stated Goals  be able to walk and use L arm    Currently in Pain?  Yes    Pain Score  4     Pain Location  Leg    Pain  Orientation  Right    Pain Descriptors / Indicators  Aching    Pain Type  Neuropathic pain    Pain Onset  More than a month ago    Pain Frequency  Constant    Aggravating Factors   standing    Pain Relieving Factors  sitting        Picking up medium pegs to place in pegboard with mod difficulty/cueing to for attention to LUE, midline alignment, and coordination.    Playing dominoes with LUE with min cueing to use LUE and to slow down, LUE control.  Pt needed min cueing for attention to task.  Playing UNO, holding cards in R hand and playing cards/drawing cards with LUE with min-mod cueing for attention to task, LUE control/impulsivity.                           OT Short Term Goals - 12/17/17 1456      OT SHORT TERM GOAL #1   Title  --      OT SHORT TERM GOAL #2   Title  --      OT SHORT TERM GOAL #3   Title  --      OT SHORT TERM GOAL #4   Title  --      OT SHORT TERM GOAL #5   Title  --      OT SHORT TERM GOAL #6   Title  --      OT SHORT TERM GOAL #7   Title  Pt will complete functional task with LUE with no more than min cueing for impulsivity.--check STGs 12/14/16    Status  On-going inconsistent      OT SHORT TERM GOAL #8   Title  Pt will perform simple home maintenance task/snack prep in standing with set-up/supervision and no more than 1 v.c. for safety.    Status  Achieved      OT SHORT TERM GOAL  #9   TITLE  Pt will improve coordination for ADLs as shown by completing 9-hole peg test in less than 74mn 30sec.    Status  On-going 2 mins 43 secs        OT Long Term Goals - 11/14/17 1734      OT LONG TERM GOAL #1   Title  Pt/caregiver will be independent with updated HEP.--check LTGs 01/13/18    Time  8    Period  Weeks    Status  On-going 11/14/17:  needs further updates    Target Date  01/13/18      OT LONG TERM GOAL #2   Title  Pt will be use LUE as nondominant assist at least 75% of the time for ADLs without cueing.    Time  8    Period  Weeks    Status  Achieved uses 75% x however needs prompting at times.  09/19/17:  met per pt  90%      OT LONG TERM GOAL #3   Title  Pt will perform toileting mod I (including transfer).    Time  8    Period  Weeks    Status  Achieved supervision.  09/19/17:  continues to need superivsion for mobility.  11/05/17  met      OT LONG TERM GOAL #4   Title  Pt will improve LUE coordination/control to improve score on box and blocks to at least 27 with LUE in 2 trials.--revised 11/14/17  Baseline  19 blocks    Time  8    Period  Weeks    Status  Revised 18, 19 blocks.  09/19/17:  25 blocks.  11/14/17:  19 blocks/inconsistent with  frustration level      OT LONG TERM GOAL #5   Title  Pt will perform simple home maintenance task/snack prep in standing mod I.--updated 11/14/17    Time  8    Period  Weeks    Status  Revised Pt is not perfoming consistely, she has made a sandwich in seated.  11/14/17:  approximating, supervision/min A and cueing      OT LONG TERM GOAL #6   Title  Pt will perform simple environmental scanning/navigation with mod I.--revised 11/14/17    Time  8    Period  Weeks    Status  Revised 11/05/17  min cueing and supervision for mobility, environmental scanning with good accuracy      OT LONG TERM GOAL #7   Title  Pt will improve coordination for ADLs as shown by completing 9-hole peg test in less than 44mn.    Baseline  259m 5.44sec    Time  8    Period  Weeks    Status  New            Plan - 12/17/17 1459    Clinical Impression Statement   Pt is progressing towards goals for LUE functional use.  Progress continues to be impacted by LE pain and depressed mood as this is affecting carryover.    Rehab Potential  Good    OT Frequency  2x / week    OT Duration  8 weeks    OT Treatment/Interventions  Self-care/ADL training;Therapeutic exercise;DME and/or AE instruction;Therapeutic activities;Cognitive remediation/compensation;Visual/perceptual remediation/compensation;Passive range of motion;Functional Mobility Training;Neuromuscular education;Cryotherapy;Paraffin;Energy conservation;Manual Therapy;Patient/family education;Ultrasound;Balance training;Moist Heat;Fluidtherapy    Plan  continue with simple home management, LUE functional use    OT Home Exercise Plan  Education provided:  HEP for LUE control, coordination, and functional use    Consulted and Agree with Plan of Care  Patient;Family member/caregiver    Family Member Consulted  mother       Patient will benefit from skilled therapeutic intervention in order to improve the following deficits and impairments:  Decreased coordination,  Decreased range of motion, Difficulty walking, Abnormal gait, Decreased safety awareness, Impaired sensation, Decreased knowledge of precautions, Decreased balance, Decreased knowledge of use of DME, Impaired UE functional use, Decreased cognition, Decreased mobility, Decreased strength, Impaired vision/preception, Impaired perceived functional ability  Visit Diagnosis: Other lack of coordination  Attention and concentration deficit  Visuospatial deficit  Other symptoms and signs involving cognitive functions following nontraumatic subarachnoid hemorrhage  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 Weakness with dizziness-  [facility] 04/07[facility] 08/07/2017  . Disturbances of vision, late effect of stroke 08/06/2017  . Health education/counseling 08/05/2017  . High risk medications (not anticoagulants) long-term use 08/05/2017  . Neuritis-  R sided:  arm and leg/ body due to stroke 08/05/2017  . Elevated LDL cholesterol level 07/11/2017  . [employer] (NIH) Stroke Scale limb ataxia score 2, ataxia present in two limbs 06/25/2017  . Alteration of sensation as  late effect of stroke 06/25/2017  . Elevated vitamin B12 level 05/30/2017  . Vitamin D deficiency 05/29/2017  . History of tobacco abuse-  30pk yr hx - quit 04/07/17 05/21/2017  . Gait disturbance, post-stroke 05/14/2017  . Benign essential HTN   . Vitreous hemorrhage of right eye ([place])   . Adjustment disorder with mixed anxiety and depressed mood   . 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 s/p SAH (subarachnoid hemorrhage)  ([place]) 04/19/2017  . 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 Hypoxia   . Subarachnoid hemorrhage due to ruptured aneurysm ([place]) 04/07/2017  . CVA (cerebral vascular accident) ([place]) 04/07/2017  . Elevated gastrin level 01/25/2012  . Hypokalemia 01/21/2012  . Nausea & vomiting 01/20/2012  . Epigastric pain 01/20/2012  . Duodenal ulcer, acute with obstruction 10/17/2011  . S/P laparoscopic cholecystectomy 10/16/2011    [facility] 12/17/2017, 5:33 PM  [facility] [address] [place], [place], [address] Phone: [phone]   Fax:  [phone]  Name: SAPHYRE CILLO MRN: [id number] Date of Birth: Jan 12, 1976   Vianne Bulls, OTR/L [facility] [address] [place], [place]  [address] [phone] phone [phone] 12/17/17 5:33 PM

## 2017-12-18 NOTE — Therapy (Signed)
[facility] [address] [place], [place], [address] Phone: [phone]   Fax:  [phone]  Physical Therapy Treatment  Patient Details  Name: [name] Barnett MRN: [id number] Date of Birth: 1976/08/17 Referring Provider: Charlett Blake, MD   Encounter Date: 12/17/2017  PT End of Session - 12/17/17 1544    Visit Number  39    Number of Visits  46    Date for PT Re-Evaluation  01/13/18    Authorization Type  BCBS    PT Start Time  1538 late from OT    PT Stop Time  1618    PT Time Calculation (min)  40 min    Activity Tolerance  Patient tolerated treatment well    Behavior During Therapy  [facility] for tasks assessed/performed       Past Medical History:  Diagnosis Date  . Duodenal obstruction   . GERD (gastroesophageal reflux disease)   . Hypertension   . Stroke [facility])    april 2018    Past Surgical History:  Procedure Laterality Date  . ABDOMINAL HYSTERECTOMY    . BALLOON DILATION  12/31/2011   Procedure: BALLOON DILATION;  Surgeon: Missy Sabins, MD;  Location: [facility] ENDOSCOPY;  Service: Endoscopy;  Laterality: N/A;  . CHOLECYSTECTOMY  07/28/11  . ESOPHAGOGASTRODUODENOSCOPY  10/17/2011   Procedure: ESOPHAGOGASTRODUODENOSCOPY (EGD);  Surgeon: Missy Sabins, MD;  Location: [facility] ENDOSCOPY;  Service: Endoscopy;  Laterality: N/A;  . IR ANGIO INTRA EXTRACRAN SEL INTERNAL CAROTID BILAT MOD SED  04/07/2017  . IR ANGIO VERTEBRAL SEL VERTEBRAL UNI R MOD SED  04/07/2017  . IR ANGIOGRAM FOLLOW UP STUDY  04/07/2017  . IR ANGIOGRAM FOLLOW UP STUDY  04/07/2017  . IR ANGIOGRAM FOLLOW UP STUDY  04/07/2017  . IR ANGIOGRAM FOLLOW UP STUDY  04/07/2017  . IR ANGIOGRAM FOLLOW UP STUDY  04/07/2017  . IR ANGIOGRAM SELECTIVE EACH ADDITIONAL VESSEL  04/07/2017  . IR TRANSCATH/EMBOLIZ  04/07/2017  . PARS PLANA VITRECTOMY Right 05/01/2017   Procedure: PARS PLANA VITRECTOMY WITH 25 GAUGE RIGHT EYE, endolaser photocoaglation;  Surgeon: Jalene Mullet, MD;   Location: [place];  Service: Ophthalmology;  Laterality: Right;  . RADIOLOGY WITH ANESTHESIA N/A 04/07/2017   Procedure: RADIOLOGY WITH ANESTHESIA;  Surgeon: Consuella Lose, MD;  Location: [place];  Service: Radiology;  Laterality: N/A;  . REPLACEMENT TOTAL KNEE BILATERAL  2004  . TEMPOROMANDIBULAR JOINT SURGERY     2 surgeries  . TUMOR REMOVAL    . VAGOTOMY  01/23/2012   Procedure: VAGOTOMY, antrectomy and BII;  Surgeon: Haywood Lasso, MD;  Location: [place];  Service: General;  Laterality: N/A;  Laparotomy with vagotomy.    There were no vitals filed for this visit.  Subjective Assessment - 12/17/17 1542    Subjective  No new complaints. No falls to report. LE pain is coming back as of this am. Has the nerve block set for Friday at 2:45. Plans to do the "dry run" of shower on Thursday with spouse.     Patient is accompained by:  Family member spouse    Limitations  Standing;Walking    Currently in Pain?  Yes    Pain Score  4     Pain Orientation  Right    Pain Descriptors / Indicators  Aching;Sore    Pain Type  Neuropathic pain    Pain Onset  More than a month ago    Pain Frequency  Constant    Aggravating Factors  standing    Pain Relieving Factors  sitting            [facility] Adult PT Treatment/Exercise - 12/17/17 1547      Transfers   Transfers  Sit to Stand;Stand to Sit    Sit to Stand  4: Min guard;With upper extremity assist;From bed;From chair/3-in-1    Stand to Sit  4: Min guard;With upper extremity assist;To bed;To chair/3-in-1      Ambulation/Gait   Ambulation/Gait  Yes    Ambulation/Gait Assistance  5: Supervision;4: Min assist;3: Mod assist supervision with RW    Ambulation/Gait Assistance Details  min assist increasing to mod/max assist for balancel, cues on posture, step length, and base of support. cues for sequencing with cane as well.     Ambulation Distance (Feet)  70 Feet x2 with RW; 115 x1 with cane    Assistive device  Rolling walker;Straight cane  straight cane with rubber quad tip    Gait Pattern  Step-through pattern;Decreased weight shift to right;Ataxic;Trunk flexed;Narrow base of support    Ambulation Surface  Level;Indoor    Stairs  Yes    Stairs Assistance  4: Min guard;4: Min assist    Stairs Assistance Details (indicate cue type and reason)  cues for weight shifting, hand advancement on rail and technique.    Stair Management Technique  One rail Right;One rail Left;Alternating pattern;Forwards    Number of Stairs  4    Height of Stairs  6      Neuro Re-ed    Neuro Re-ed Details   neuro re-ed for balance/postural control/strengthening: sit/stands in staggered stance with right foot back-emphasis on anterior weight shifting and to power up with legs to decrease retropulsion on mat, min guard to min assist to stabilize once standing, cues for slow/controlled descent with sitting back down. tall kneeling on mat: mini squats with emphasis on equal LE weight bearing and full return to tall kneelking x 10 reps, then fwd/bwd walking across mat x 2 laps each with min to mod assist to prevent balance loss. cues for increased weight shifting and to lift hip/leg more to fully clear mat surface.           PT Short Term Goals - 12/17/17 1440      PT SHORT TERM GOAL #1   Title  Pt will improve DGI by 4 points from initial assessment    Baseline  10/24, 11/24    Status  Not Met      PT SHORT TERM GOAL #2   Title  Pt will decrease falls risk with gait as indicated by increase in gait velocity with LRAD to > or = 2.0 ft/sec    Baseline  1.55 ft/sec when experiencing pain in RLE, 1.8 ft/sec RW    Status  Partially Met      PT SHORT TERM GOAL #3   Title  Pt will decrease falls risk as indicated by increase in BERG balance score to > or = 40/56    Baseline  34/56 when experiencing increased pain in RLE, 43/56 on 12/13/17    Status  Achieved      PT SHORT TERM GOAL #4   Title  Pt will ambulate >200' on indoor surfaces with cane (or no AD)  and negotiate 4 stairs with one rail, alternating sequence with min A     Baseline  12/17/17: met stair portion of gaol only.    Status  Partially Met          PT Long Term Goals - 11/14/17 1719      PT LONG TERM GOAL #1   Title  Pt and husband will demonstrate independence with HEP    Time  8    Period  Weeks    Status  Revised    Target Date  01/13/18      PT LONG TERM GOAL #2   Title  Pt will demonstrate improved balance and decreased falls risk as indicated by BERG balance score of > or = 45/56    Baseline   34/56 taken 12/3       Time  8    Period  Weeks    Status  Revised    Target Date  01/13/18      PT LONG TERM GOAL #3   Title  Pt will ambulate with cane (or no AD) x 300' over outdoor paved surfaces with min A; will negotiate 4 stairs with one rail with alternating sequence and supervision    Time  8    Period  Weeks    Status  Revised    Target Date  01/13/18      PT LONG TERM GOAL #4   Title  Pt will report 15% improvement in Neuro QOL-LE    Baseline  31.3%    Time  8    Period  Weeks    Status  Revised    Target Date  01/13/18      PT LONG TERM GOAL #5   Title  Pt will decrease falls risk during gait in home/community as indicated by increase in gait velocity to > or = 2.5 ft/sec with LRAD    Baseline  1.55 ft/sec on 12/3    Time  8    Period  Weeks    Status  Revised    Target Date  01/13/18      PT LONG TERM GOAL #6   Title  Pt will demonstrate decreased falls risk with gait as indicated by increase in DGI score by 8 points from initial assessment    Baseline  10/24    Time  8    Period  Weeks    Status  Revised    Target Date  01/13/18          Plan - 12/17/17 1544    Clinical Impression Statement  Pt partiallly met remaining STG. Remainder of session continued to address LE/core strengthening and postural control. Pt is progressing and should benefit from continued PT to progress toward unmet goals     Rehab Potential  Good    PT Frequency   2x / week    PT Duration  8 weeks    PT Treatment/Interventions  ADLs/Self Care Home Management;Aquatic Therapy;DME Instruction;Gait training;Stair training;Functional mobility training;Therapeutic activities;Therapeutic exercise;Balance training;Neuromuscular re-education;Patient/family education;Vestibular;Visual/perceptual remediation/compensation;Cognitive remediation;Orthotic Fit/Training    PT Next Visit Plan   gait with cane, Did she dry run a shower?  treadmill: one LE on belt-swing phase for coordination/control; gait without AD: in // bars, side stepping, retro, Physioball exercises for postural control.      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 Weakness with dizziness-  since SAH and CVA 04/07/17 08/07/2017  . Disturbances of vision, late effect of stroke 08/06/2017  . Health education/counseling 08/05/2017  . High risk medications (not anticoagulants) long-term use 08/05/2017  . Neuritis-  R sided:  arm and leg/ body due to stroke 08/05/2017  . Elevated LDL cholesterol level 07/11/2017  . National Institutes of Health (NIH) Stroke Scale limb ataxia score 2, ataxia present in two limbs 06/25/2017  . Alteration of sensation as late effect of  stroke 06/25/2017  . Elevated vitamin B12 level 05/30/2017  . Vitamin D deficiency 05/29/2017  . History of tobacco abuse-  30pk yr hx - quit 04/07/17 05/21/2017  . Gait disturbance, post-stroke 05/14/2017  . Benign essential HTN   . Vitreous hemorrhage of right eye ([name])   . Adjustment disorder with mixed anxiety and depressed mood   . 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 s/p SAH (subarachnoid hemorrhage) ([name]) 04/19/2017  . Basilar artery aneurysm ([name])   . Hypoxia   . 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 CVA (cerebral vascular accident) ([name]) 04/07/2017  . Elevated gastrin level 01/25/2012  . Hypokalemia 01/21/2012  . Nausea & vomiting 01/20/2012  . Epigastric pain 01/20/2012  . Duodenal ulcer, acute with obstruction 10/17/2011  . S/P laparoscopic cholecystectomy 10/16/2011     , PTA, CLT [facility] [address], [address] [place], [place] [address] [phone] 12/18/17, 2:43 PM   Name: Conda F Starke MRN: [id number] Date of Birth: 04/16/1976   

## 2017-12-19 ENCOUNTER — Ambulatory Visit: Payer: BLUE CROSS/BLUE SHIELD | Admitting: Physical Therapy

## 2017-12-19 ENCOUNTER — Ambulatory Visit: Payer: BLUE CROSS/BLUE SHIELD | Admitting: Occupational Therapy

## 2017-12-19 ENCOUNTER — Encounter: Payer: Self-pay | Admitting: Physical Therapy

## 2017-12-19 ENCOUNTER — Encounter: Payer: Self-pay | Admitting: Occupational Therapy

## 2017-12-19 DIAGNOSIS — R2689 Other abnormalities of gait and mobility: Secondary | ICD-10-CM

## 2017-12-19 DIAGNOSIS — R278 Other lack of coordination: Secondary | ICD-10-CM

## 2017-12-19 DIAGNOSIS — R208 Other disturbances of skin sensation: Secondary | ICD-10-CM

## 2017-12-19 DIAGNOSIS — I69018 Other symptoms and signs involving cognitive functions following nontraumatic subarachnoid hemorrhage: Secondary | ICD-10-CM

## 2017-12-19 DIAGNOSIS — R41840 Attention and concentration deficit: Secondary | ICD-10-CM

## 2017-12-19 DIAGNOSIS — R260 Ataxic gait: Secondary | ICD-10-CM | POA: Diagnosis not present

## 2017-12-19 DIAGNOSIS — R41842 Visuospatial deficit: Secondary | ICD-10-CM

## 2017-12-19 DIAGNOSIS — R2681 Unsteadiness on feet: Secondary | ICD-10-CM

## 2017-12-19 DIAGNOSIS — I69054 Hemiplegia and hemiparesis following nontraumatic subarachnoid hemorrhage affecting left non-dominant side: Secondary | ICD-10-CM

## 2017-12-19 DIAGNOSIS — R414 Neurologic neglect syndrome: Secondary | ICD-10-CM

## 2017-12-19 DIAGNOSIS — R270 Ataxia, unspecified: Secondary | ICD-10-CM

## 2017-12-19 NOTE — Therapy (Signed)
[facility] [address] [place], [place], [address] Phone: [phone]   Fax:  [phone]  Physical Therapy Treatment  Patient Details  Name: SHYVONNE CHASTANG MRN: [id number] Date of Birth: 12/06/1976 Referring Provider: Charlett Blake, MD   Encounter Date: 12/19/2017  PT End of Session - 12/19/17 1551    Visit Number  40    Number of Visits  46    Date for PT Re-Evaluation  01/13/18    Authorization Type  BCBS    PT Start Time  1453    PT Stop Time  1532    PT Time Calculation (min)  39 min    Activity Tolerance  Patient tolerated treatment well    Behavior During Therapy  [facility] for tasks assessed/performed       Past Medical History:  Diagnosis Date  . Duodenal obstruction   . GERD (gastroesophageal reflux disease)   . Hypertension   . Stroke [facility])    april 2018    Past Surgical History:  Procedure Laterality Date  . ABDOMINAL HYSTERECTOMY    . BALLOON DILATION  12/31/2011   Procedure: BALLOON DILATION;  Surgeon: Missy Sabins, MD;  Location: [facility] ENDOSCOPY;  Service: Endoscopy;  Laterality: N/A;  . CHOLECYSTECTOMY  07/28/11  . ESOPHAGOGASTRODUODENOSCOPY  10/17/2011   Procedure: ESOPHAGOGASTRODUODENOSCOPY (EGD);  Surgeon: Missy Sabins, MD;  Location: [facility] ENDOSCOPY;  Service: Endoscopy;  Laterality: N/A;  . IR ANGIO INTRA EXTRACRAN SEL INTERNAL CAROTID BILAT MOD SED  04/07/2017  . IR ANGIO VERTEBRAL SEL VERTEBRAL UNI R MOD SED  04/07/2017  . IR ANGIOGRAM FOLLOW UP STUDY  04/07/2017  . IR ANGIOGRAM FOLLOW UP STUDY  04/07/2017  . IR ANGIOGRAM FOLLOW UP STUDY  04/07/2017  . IR ANGIOGRAM FOLLOW UP STUDY  04/07/2017  . IR ANGIOGRAM FOLLOW UP STUDY  04/07/2017  . IR ANGIOGRAM SELECTIVE EACH ADDITIONAL VESSEL  04/07/2017  . IR TRANSCATH/EMBOLIZ  04/07/2017  . PARS PLANA VITRECTOMY Right 05/01/2017   Procedure: PARS PLANA VITRECTOMY WITH 25 GAUGE RIGHT EYE, endolaser photocoaglation;  Surgeon: Jalene Mullet, MD;  Location:  [place];  Service: Ophthalmology;  Laterality: Right;  . RADIOLOGY WITH ANESTHESIA N/A 04/07/2017   Procedure: RADIOLOGY WITH ANESTHESIA;  Surgeon: Consuella Lose, MD;  Location: [place];  Service: Radiology;  Laterality: N/A;  . REPLACEMENT TOTAL KNEE BILATERAL  2004  . TEMPOROMANDIBULAR JOINT SURGERY     2 surgeries  . TUMOR REMOVAL    . VAGOTOMY  01/23/2012   Procedure: VAGOTOMY, antrectomy and BII;  Surgeon: Haywood Lasso, MD;  Location: [place];  Service: General;  Laterality: N/A;  Laparotomy with vagotomy.    There were no vitals filed for this visit.  Subjective Assessment - 12/19/17 1457    Subjective  RLE pain continues to stay at 8/10; went shopping yesterday to [employer] and [employer] [place], leg pain did not increase with prolonged walking.  Has injection tomorrow.  Is going to do a full shower tonight with husband,    Patient is accompained by:  Family member spouse    Limitations  Standing;Walking    Currently in Pain?  Yes    Pain Score  8     Pain Location  Leg    Pain Orientation  Right    Pain Descriptors / Indicators  Throbbing    Pain Type  Chronic pain;Neuropathic pain    Pain Onset  More than a month ago    Pain Frequency  Constant  [place] Adult PT Treatment/Exercise - 12/19/17 1543      Ambulation/Gait   Ambulation/Gait  Yes    Ambulation/Gait Assistance  4: Min assist    Ambulation/Gait Assistance Details  carryover of treadmill training to gait overground with cane with pt demonstrating improved reciprocal stepping with decreased "stomping" of LLE, more narrow BOS, improved weight shifting to LLE, increased stance time, step and stride length even when turning around curve in track    Ambulation Distance (Feet)  115 Feet x 2 reps    Assistive device  Straight cane    Gait Pattern  Step-through pattern;Decreased arm swing - left;Decreased weight shift to left    Ambulation Surface  Level;Indoor      Neuro Re-ed    Neuro  Re-ed Details   NMR on treadmill with one foot on belt, contralateral foot on stable platform to the side focusing on coordination, motor planning/timing and sequencing of swing phase for LE on the belt, and full weight shift and weight acceptance to foot on platform.  Performed for 3-4 minutes each LE      Knee/Hip Exercises: Aerobic   Tread Mill  at slower speed: 0.6 mph x 6 minutes with focus on carryover of single leg training with pt demonstrating more normal BOS width, increased stance time each LE with increased terminal hip extension and full step length and soft heel strike with LLE; decreased "whip" of LLE noted; no pain in RLE reported during treadmill             PT Education - 12/19/17 1551    Education provided  Yes    Education Details  gait on treadmill, gait with cane    Person(s) Educated  Patient;Spouse    Methods  Explanation;Demonstration    Comprehension  Need further instruction       PT Short Term Goals - 12/17/17 1440      PT SHORT TERM GOAL #1   Title  Pt will improve DGI by 4 points from initial assessment    Baseline  10/24, 11/24    Status  Not Met      PT SHORT TERM GOAL #2   Title  Pt will decrease falls risk with gait as indicated by increase in gait velocity with LRAD to > or = 2.0 ft/sec    Baseline  1.55 ft/sec when experiencing pain in RLE, 1.8 ft/sec RW    Status  Partially Met      PT SHORT TERM GOAL #3   Title  Pt will decrease falls risk as indicated by increase in BERG balance score to > or = 40/56    Baseline  34/56 when experiencing increased pain in RLE, 43/56 on 12/13/17    Status  Achieved      PT SHORT TERM GOAL #4   Title  Pt will ambulate >200' on indoor surfaces with cane (or no AD) and negotiate 4 stairs with one rail, alternating sequence with min A     Baseline  12/17/17: met stair portion of gaol only.    Status  Partially Met        PT Long Term Goals - 12/19/17 1556      PT LONG TERM GOAL #1   Title  Pt and husband  will demonstrate independence with HEP    Time  8    Period  Weeks    Status  Revised    Target Date  01/13/18      PT LONG TERM GOAL #  2   Title  Pt will demonstrate improved balance and decreased falls risk as indicated by BERG balance score of > or = 45/56    Baseline  43/56    Time  8    Period  Weeks    Status  Revised    Target Date  01/13/18      PT LONG TERM GOAL #3   Title  Pt will ambulate with cane (or no AD) x 250' over outdoor paved surfaces with min A; will negotiate 4 stairs with one rail with alternating sequence and supervision    Time  8    Period  Weeks    Status  Revised    Target Date  01/13/18      PT LONG TERM GOAL #4   Title  Pt will report 15% improvement in Neuro QOL-LE    Baseline  31.3%    Time  8    Period  Weeks    Status  Revised      PT LONG TERM GOAL #5   Title  Pt will decrease falls risk during gait in home/community as indicated by increase in gait velocity to > or = 2.0 ft/sec with LRAD    Baseline  1.8 ft/sec with RW    Time  8    Period  Weeks    Status  Revised    Target Date  01/13/18      PT LONG TERM GOAL #6   Title  Pt will demonstrate decreased falls risk with gait as indicated by increase in DGI score to >/= 15/24 with LRAD    Baseline  11/24    Time  8    Period  Weeks    Status  Revised    Target Date  01/13/18            Plan - 12/19/17 1552    Clinical Impression Statement  Continued coordination and gait sequence training on treadmill initially isolating each LE during swing phase progressing towards coordinating more fluid reciprocal stepping pattern during bipedal gait on treadmill >> gait training over ground with cane.  With slowed gait speed and increased pt attention to gait sequence pt demonstrated more fluid, safe and efficient gait with SPC and decreased pain in RLE.  Will continue to progress towards LTG.      Rehab Potential  Good    PT Frequency  2x / week    PT Duration  8 weeks    PT  Treatment/Interventions  ADLs/Self Care Home Management;Aquatic Therapy;DME Instruction;Gait training;Stair training;Functional mobility training;Therapeutic activities;Therapeutic exercise;Balance training;Neuromuscular re-education;Patient/family education;Vestibular;Visual/perceptual remediation/compensation;Cognitive remediation;Orthotic Fit/Training    PT Next Visit Plan   have pt begin to walk to/from waiting area with cane, Did she dry run a shower?  treadmill: one LE on belt-swing phase for coordination/control; gait without AD: in // bars, side stepping, retro, Physioball exercises for postural control.      Consulted and Agree with Plan of Care  Patient;Family member/caregiver    Family Member Consulted  husband       Patient will benefit from skilled therapeutic intervention in order to improve the following deficits and impairments:  Abnormal gait, Decreased balance, Decreased cognition, Decreased coordination, Decreased strength, Difficulty walking, Dizziness, Impaired sensation, Impaired vision/preception, Pain, Decreased activity tolerance, Decreased mobility  Visit Diagnosis: Ataxic gait  Other lack of coordination  Other abnormalities of gait and mobility  Unsteadiness on feet     Problem List Patient Active Problem List   Diagnosis  Date Noted  . 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 Weakness with dizziness-  since [employer] and CVA 04/07/17 08/07/2017  . Disturbances of vision, late effect of stroke 08/06/2017  . Health education/counseling 08/05/2017  . High risk medications (not anticoagulants) long-term use 08/05/2017  . Neuritis-  R sided:  arm and leg/ body due to stroke 08/05/2017  . Elevated LDL cholesterol level 07/11/2017  . [employer] (NIH) Stroke Scale limb ataxia score 2, ataxia present in two limbs 06/25/2017  . 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 Elevated vitamin B12 level 05/30/2017  . Vitamin D deficiency  05/29/2017  . History of tobacco abuse-  30pk yr hx - quit 04/07/17 05/21/2017  . Gait disturbance, post-stroke 05/14/2017  . Benign essential HTN   . Vitreous hemorrhage of right eye ([place])   . Adjustment disorder with mixed anxiety and depressed mood   . 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 Terson syndrome of both eyes ([place]) 04/23/2017  . s/p SAH (subarachnoid hemorrhage) ([place]) 04/19/2017  . Basilar artery aneurysm ([place])   . Hypoxia   . Subarachnoid hemorrhage due to ruptured aneurysm ([place]) 04/07/2017  . CVA (cerebral vascular accident) ([place]) 04/07/2017  . Elevated gastrin level 01/25/2012  . Hypokalemia 01/21/2012  . Nausea & vomiting 01/20/2012  . Epigastric pain 01/20/2012  . Duodenal ulcer, acute with obstruction 10/17/2011  . S/P laparoscopic cholecystectomy 10/16/2011    Rico Junker, PT, DPT 12/19/17    3:59 PM    [facility] [address] [place], [place], [address] Phone: [phone]   Fax:  [phone]  Name: TENAYA HILYER MRN: [id number] Date of Birth: Jul 27, 1976

## 2017-12-19 NOTE — Therapy (Signed)
[facility] [address] [place], [place], [address] Phone: [phone]   Fax:  [phone]  Occupational Therapy Treatment  Patient Details  Name: [name] Barnett MRN: [id number] Date of Birth: July 08, 1976 Referring Provider: Charlett Blake, MD   Encounter Date: 12/19/2017  OT End of Session - 12/19/17 1634    Visit Number  35    Number of Visits  48    Date for OT Re-Evaluation  01/13/18    Authorization Type  BCBS, no visit limit/auth req.    Authorization Time Period  renewal completed 11/14/17 for 8 wks    OT Start Time  1538    OT Stop Time  1620    OT Time Calculation (min)  42 min    Activity Tolerance  Patient tolerated treatment well    Behavior During Therapy  WFL for tasks assessed/performed       Past Medical History:  Diagnosis Date  . Duodenal obstruction   . GERD (gastroesophageal reflux disease)   . Hypertension   . Stroke [facility])    april 2018    Past Surgical History:  Procedure Laterality Date  . ABDOMINAL HYSTERECTOMY    . BALLOON DILATION  12/31/2011   Procedure: BALLOON DILATION;  Surgeon: Missy Sabins, MD;  Location[name] ENDOSCOPY;  Service: Endoscopy;  Laterality: N/A;  . CHOLECYSTECTOMY  07/28/11  . ESOPHAGOGASTRODUODENOSCOPY  10/17/2011   Procedure: ESOPHAGOGASTRODUODENOSCOPY (EGD);  Surgeon: Missy Sabins, MD;  Location: [facility] ENDOSCOPY;  Service: Endoscopy;  Laterality: N/A;  . IR ANGIO INTRA EXTRACRAN SEL INTERNAL CAROTID BILAT MOD SED  04/07/2017  . IR ANGIO VERTEBRAL SEL VERTEBRAL UNI R MOD SED  04/07/2017  . IR ANGIOGRAM FOLLOW UP STUDY  04/07/2017  . IR ANGIOGRAM FOLLOW UP STUDY  04/07/2017  . IR ANGIOGRAM FOLLOW UP STUDY  04/07/2017  . IR ANGIOGRAM FOLLOW UP STUDY  04/07/2017  . IR ANGIOGRAM FOLLOW UP STUDY  04/07/2017  . IR ANGIOGRAM SELECTIVE EACH ADDITIONAL VESSEL  04/07/2017  . IR TRANSCATH/EMBOLIZ  04/07/2017  . PARS PLANA VITRECTOMY Right 05/01/2017   Procedure: PARS PLANA  VITRECTOMY WITH 25 GAUGE RIGHT EYE, endolaser photocoaglation;  Surgeon: Jalene Mullet, MD;  Location: [facility];  Service: Ophthalmology;  Laterality: Right;  . RADIOLOGY WITH ANESTHESIA N/A 04/07/2017   Procedure: RADIOLOGY WITH ANESTHESIA;  Surgeon: Consuella Lose, MD;  Location: [facility];  Service: Radiology;  Laterality: N/A;  . REPLACEMENT TOTAL KNEE BILATERAL  2004  . TEMPOROMANDIBULAR JOINT SURGERY     2 surgeries  . TUMOR REMOVAL    . VAGOTOMY  01/23/2012   Procedure: VAGOTOMY, antrectomy and BII;  Surgeon: Haywood Lasso, MD;  Location: [facility];  Service: General;  Laterality: N/A;  Laparotomy with vagotomy.    There were no vitals filed for this visit.  Subjective Assessment - 12/19/17 1538    Subjective   nerve blocks Friday.  "I've been lazy today"    Pertinent History  Subarachnoid hemorrhage due to ruptured aneurysm, ataxia (L cerebellar); HTN, R side decr sensation, vertical diplopia/visual deficits, ataxia, Bilateral retinal hemorhage associated with SAH (with surgery on R eye in hospital and L approx 1 month ago)    Limitations  visual deficits, fall risk, impulsivity/cognitive deficits    Patient Stated Goals  be able to walk and use L arm    Currently in Pain?  Yes    Pain Score  1     Pain Location  Leg    Pain  Orientation  Right    Pain Descriptors / Indicators  Aching    Pain Type  Neuropathic pain    Pain Onset  More than a month ago    Pain Frequency  Constant    Aggravating Factors   standing     Pain Relieving Factors  sitting        Playing UNO, holding cards in R hand and playing cards/drawing cards/dealing cards with LUE with min cueing for attention to task, LUE control/impulsivity.   In standing, functional wt. Shift to the L with LUE functional reaching to grasp/release clothespins and place on vertical pole (1-8lb resistance for graded movement) with min cueing for controlled knee flex and slow, controlled LUE movement.    Waving and tossing scarf  with LUE with min cueing for slow, controlled, smooth movement.                       OT Short Term Goals - 12/17/17 1456      OT SHORT TERM GOAL #1   Title  --      OT SHORT TERM GOAL #2   Title  --      OT SHORT TERM GOAL #3   Title  --      OT SHORT TERM GOAL #4   Title  --      OT SHORT TERM GOAL #5   Title  --      OT SHORT TERM GOAL #6   Title  --      OT SHORT TERM GOAL #7   Title  Pt will complete functional task with LUE with no more than min cueing for impulsivity.--check STGs 12/14/16    Status  On-going inconsistent      OT SHORT TERM GOAL #8   Title  Pt will perform simple home maintenance task/snack prep in standing with set-up/supervision and no more than 1 v.c. for safety.    Status  Achieved      OT SHORT TERM GOAL  #9   TITLE  Pt will improve coordination for ADLs as shown by completing 9-hole peg test in less than 64mn 30sec.    Status  On-going 2 mins 43 secs        OT Long Term Goals - 11/14/17 1734      OT LONG TERM GOAL #1   Title  Pt/caregiver will be independent with updated HEP.--check LTGs 01/13/18    Time  8    Period  Weeks    Status  On-going 11/14/17:  needs further updates    Target Date  01/13/18      OT LONG TERM GOAL #2   Title  Pt will be use LUE as nondominant assist at least 75% of the time for ADLs without cueing.    Time  8    Period  Weeks    Status  Achieved uses 75% x however needs prompting at times.  09/19/17:  met per pt  90%      OT LONG TERM GOAL #3   Title  Pt will perform toileting mod I (including transfer).    Time  8    Period  Weeks    Status  Achieved supervision.  09/19/17:  continues to need superivsion for mobility.  11/05/17  met      OT LONG TERM GOAL #4   Title  Pt will improve LUE coordination/control to improve score on box and blocks to at least 27 with  LUE in 2 trials.--revised 11/14/17    Baseline  19 blocks    Time  8    Period  Weeks    Status  Revised 18, 19 blocks.   09/19/17:  25 blocks.  11/14/17:  19 blocks/inconsistent with frustration level      OT LONG TERM GOAL #5   Title  Pt will perform simple home maintenance task/snack prep in standing mod I.--updated 11/14/17    Time  8    Period  Weeks    Status  Revised Pt is not perfoming consistely, she has made a sandwich in seated.  11/14/17:  approximating, supervision/min A and cueing      OT LONG TERM GOAL #6   Title  Pt will perform simple environmental scanning/navigation with mod I.--revised 11/14/17    Time  8    Period  Weeks    Status  Revised 11/05/17  min cueing and supervision for mobility, environmental scanning with good accuracy      OT LONG TERM GOAL #7   Title  Pt will improve coordination for ADLs as shown by completing 9-hole peg test in less than 55mn.    Baseline  295m 5.44sec    Time  8    Period  Weeks    Status  New            Plan - 12/19/17 1634    Clinical Impression Statement   Pt is progressing towards goals for LUE functional use.  Pt with decr pain today with improved attention and LUE control.    Rehab Potential  Good    OT Frequency  2x / week    OT Duration  8 weeks    OT Treatment/Interventions  Self-care/ADL training;Therapeutic exercise;DME and/or AE instruction;Therapeutic activities;Cognitive remediation/compensation;Visual/perceptual remediation/compensation;Passive range of motion;Functional Mobility Training;Neuromuscular education;Cryotherapy;Paraffin;Energy conservation;Manual Therapy;Patient/family education;Ultrasound;Balance training;Moist Heat;Fluidtherapy    Plan  LUE functional use    OT Home Exercise Plan  Education provided:  HEP for LUE control, coordination, and functional use    Consulted and Agree with Plan of Care  Patient;Family member/caregiver    Family Member Consulted  mother       Patient will benefit from skilled therapeutic intervention in order to improve the following deficits and impairments:  Decreased coordination,  Decreased range of motion, Difficulty walking, Abnormal gait, Decreased safety awareness, Impaired sensation, Decreased knowledge of precautions, Decreased balance, Decreased knowledge of use of DME, Impaired UE functional use, Decreased cognition, Decreased mobility, Decreased strength, Impaired vision/preception, Impaired perceived functional ability  Visit Diagnosis: Other lack of coordination  Unsteadiness on feet  Other abnormalities of gait and mobility  Attention and concentration deficit  Visuospatial deficit  Other symptoms and signs involving cognitive functions following nontraumatic subarachnoid hemorrhage  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 Weakness with dizziness-  [facility] 04/07/17 08/07/2017  . Disturbances of vision, late effect of stroke 08/06/2017  . Health education/counseling 08/05/2017  . High risk medications (not anticoagulants) long-term use 08/05/2017  . Neuritis-  R sided:  arm and leg/ body due to stroke 08/05/2017  . Elevated LDL cholesterol level 07/11/2017  . [employer] (NIH) Stroke Scale limb ataxia score 2, ataxia present in two limbs 06/25/2017  . Alteration of sensation as late effect  of stroke 06/25/2017  . Elevated vitamin B12 level 05/30/2017  . Vitamin D deficiency 05/29/2017  . History of tobacco abuse-  30pk yr hx - quit 04/07/17 05/21/2017  . Gait disturbance, post-stroke 05/14/2017  . Benign essential HTN   . Vitreous hemorrhage of right eye ([place])   . Adjustment disorder with mixed anxiety and depressed mood   . 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 s/p SAH (subarachnoid hemorrhage)  ([place]) 04/19/2017  . 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 Hypoxia   . Subarachnoid hemorrhage due to ruptured aneurysm ([place]) 04/07/2017  . CVA (cerebral vascular accident) ([place]) 04/07/2017  . Elevated gastrin level 01/25/2012  . Hypokalemia 01/21/2012  . Nausea & vomiting 01/20/2012  . Epigastric pain 01/20/2012  . Duodenal ulcer, acute with obstruction 10/17/2011  . S/P laparoscopic cholecystectomy 10/16/2011    [facility] 12/19/2017, 4:36 PM  [facility] [address] [place], [place], [address] Phone: [phone]   Fax:  [phone]  Name: [name] Barnett MRN: [id number] Date of Birth: Mar 30, 1976   Vianne Bulls, OTR/L [facility] [address] [place], [place]  [address] [phone] phone [phone] 12/19/17 4:36 PM

## 2017-12-20 ENCOUNTER — Encounter: Payer: BLUE CROSS/BLUE SHIELD | Attending: Physical Medicine & Rehabilitation

## 2017-12-20 ENCOUNTER — Ambulatory Visit: Payer: BLUE CROSS/BLUE SHIELD | Admitting: Physical Medicine & Rehabilitation

## 2017-12-20 ENCOUNTER — Encounter: Payer: Self-pay | Admitting: Physical Medicine & Rehabilitation

## 2017-12-20 VITALS — BP 136/79 | HR 74 | Resp 14

## 2017-12-20 DIAGNOSIS — R42 Dizziness and giddiness: Secondary | ICD-10-CM | POA: Insufficient documentation

## 2017-12-20 DIAGNOSIS — I69093 Ataxia following nontraumatic subarachnoid hemorrhage: Secondary | ICD-10-CM | POA: Diagnosis not present

## 2017-12-20 DIAGNOSIS — I725 Aneurysm of other precerebral arteries: Secondary | ICD-10-CM | POA: Diagnosis present

## 2017-12-20 DIAGNOSIS — G5720 Lesion of femoral nerve, unspecified lower limb: Secondary | ICD-10-CM | POA: Diagnosis not present

## 2017-12-20 MED ORDER — TOPIRAMATE 50 MG PO TABS: 50.0000 mg | ORAL_TABLET | Freq: Two times a day (BID) | ORAL | 2 refills | Status: DC

## 2017-12-20 NOTE — Progress Notes (Signed)
RIght femoral nerve block under ultrasound guidance  Indication: Right femoral neuritis .  Pain persisting despite medication management and other conservative care.  Pain interferes with activities.  Procedure: Patient placed in a supine position, right groin area was scanned at the inguinal ligament and the femoral artery was identified and marked.  The right femoral nerve was visualized.  Betadine prep and sterile drape were then applied and a sterile sheath was placed over the 12 Hz linear transducer. Then a 25-gauge 1.5 inch needle was used to anesthetize skin and subcutaneous tissue with 2 mL of 1% lidocaine.  Then a 22-gauge 80 mm echo block needle was inserted along the track targeting the right femoral nerve lateral aspect.  During the procedure, Doppler was utilized to highlight the femoral artery and femoral vein and avoid vascular penetration.  After needle tip approximated inferior lateral aspect of the nerve,an aliquot of Marcaine 0.25% x 4 mL plus Celestone 6 mg/mL x 1 mL was injected.  No blood was aspirated prior to injection.  Patient tolerated procedure well, post procedure instructions were given.  Patient did experience numbness of the anterior thigh post procedure.

## 2017-12-24 ENCOUNTER — Encounter: Payer: Self-pay | Admitting: Occupational Therapy

## 2017-12-24 ENCOUNTER — Encounter: Payer: Self-pay | Admitting: Physical Therapy

## 2017-12-24 ENCOUNTER — Ambulatory Visit: Payer: BLUE CROSS/BLUE SHIELD | Admitting: Occupational Therapy

## 2017-12-24 ENCOUNTER — Ambulatory Visit: Payer: BLUE CROSS/BLUE SHIELD | Admitting: Physical Therapy

## 2017-12-24 DIAGNOSIS — R41842 Visuospatial deficit: Secondary | ICD-10-CM

## 2017-12-24 DIAGNOSIS — R260 Ataxic gait: Secondary | ICD-10-CM | POA: Diagnosis not present

## 2017-12-24 DIAGNOSIS — R208 Other disturbances of skin sensation: Secondary | ICD-10-CM

## 2017-12-24 DIAGNOSIS — I69054 Hemiplegia and hemiparesis following nontraumatic subarachnoid hemorrhage affecting left non-dominant side: Secondary | ICD-10-CM

## 2017-12-24 DIAGNOSIS — R278 Other lack of coordination: Secondary | ICD-10-CM

## 2017-12-24 DIAGNOSIS — R2681 Unsteadiness on feet: Secondary | ICD-10-CM

## 2017-12-24 DIAGNOSIS — I69018 Other symptoms and signs involving cognitive functions following nontraumatic subarachnoid hemorrhage: Secondary | ICD-10-CM

## 2017-12-24 DIAGNOSIS — R41840 Attention and concentration deficit: Secondary | ICD-10-CM

## 2017-12-24 DIAGNOSIS — R2689 Other abnormalities of gait and mobility: Secondary | ICD-10-CM

## 2017-12-24 NOTE — Therapy (Signed)
[facility] [address] [place], [place], [address] Phone: [phone]   Fax:  [phone]  Occupational Therapy Treatment  Patient Details  Name: [name] Barnett MRN: [id number] Date of Birth: 1976-10-20 Referring Provider: Charlett Blake, MD   Encounter Date: 12/24/2017  OT End of Session - 12/24/17 1628    Visit Number  36    Number of Visits  37    Date for OT Re-Evaluation  01/13/18    Authorization Type  BCBS, no visit limit/auth req.    Authorization Time Period  renewal completed 11/14/17 for 8 wks    OT Start Time  1531    OT Stop Time  1615    OT Time Calculation (min)  44 min    Activity Tolerance  Patient tolerated treatment well       Past Medical History:  Diagnosis Date  . Duodenal obstruction   . GERD (gastroesophageal reflux disease)   . Hypertension   . Stroke [facility])    april 2018    Past Surgical History:  Procedure Laterality Date  . ABDOMINAL HYSTERECTOMY    . BALLOON DILATION  12/31/2011   Procedure: BALLOON DILATION;  Surgeon: Missy Sabins, MD;  Location: [facility] ENDOSCOPY;  Service: Endoscopy;  Laterality: N/A;  . CHOLECYSTECTOMY  07/28/11  . ESOPHAGOGASTRODUODENOSCOPY  10/17/2011   Procedure: ESOPHAGOGASTRODUODENOSCOPY (EGD);  Surgeon: Missy Sabins, MD;  [facility] ENDOSCOPY;  Service: Endoscopy;  Laterality: N/A;  . IR ANGIO INTRA EXTRACRAN SEL INTERNAL CAROTID BILAT MOD SED  04/07/2017  . IR ANGIO VERTEBRAL SEL VERTEBRAL UNI R MOD SED  04/07/2017  . IR ANGIOGRAM FOLLOW UP STUDY  04/07/2017  . IR ANGIOGRAM FOLLOW UP STUDY  04/07/2017  . IR ANGIOGRAM FOLLOW UP STUDY  04/07/2017  . IR ANGIOGRAM FOLLOW UP STUDY  04/07/2017  . IR ANGIOGRAM FOLLOW UP STUDY  04/07/2017  . IR ANGIOGRAM SELECTIVE EACH ADDITIONAL VESSEL  04/07/2017  . IR TRANSCATH/EMBOLIZ  04/07/2017  . PARS PLANA VITRECTOMY Right 05/01/2017   Procedure: PARS PLANA VITRECTOMY WITH 25 GAUGE RIGHT EYE, endolaser photocoaglation;   Surgeon: Jalene Mullet, MD;  Location: [facility];  Service: Ophthalmology;  Laterality: Right;  . RADIOLOGY WITH ANESTHESIA N/A 04/07/2017   Procedure: RADIOLOGY WITH ANESTHESIA;  Surgeon: Consuella Lose, MD;  Location: [facility] [place];  Service: Radiology;  Laterality: N/A;  . REPLACEMENT TOTAL KNEE BILATERAL  2004  . TEMPOROMANDIBULAR JOINT SURGERY     2 surgeries  . TUMOR REMOVAL    . VAGOTOMY  01/23/2012   Procedure: VAGOTOMY, antrectomy and BII;  Surgeon: Haywood Lasso, MD;  Location: [facility];  Service: General;  Laterality: N/A;  Laparotomy with vagotomy.    There were no vitals filed for this visit.  Subjective Assessment - 12/24/17 1536    Patient is accompained by:  Family member husband    Pertinent History  Subarachnoid hemorrhage due to ruptured aneurysm, ataxia (L cerebellar); HTN, R side decr sensation, vertical diplopia/visual deficits, ataxia, Bilateral retinal hemorhage associated with SAH (with surgery on R eye in hospital and L approx 1 month ago)    Limitations  visual deficits, fall risk, impulsivity/cognitive deficits    Patient Stated Goals  be able to walk and use L arm    Currently in Pain?  Yes    Pain Score  5     Pain Location  Leg    Pain Orientation  Right    Pain Descriptors / Indicators  Aching  Pain Type  Neuropathic pain    Pain Onset  More than a month ago    Pain Frequency  Constant    Aggravating Factors   standing    Pain Relieving Factors  sitting                   OT Treatments/Exercises (OP) - 12/24/17 1620      Neurological Re-education Exercises   Other Exercises 1  Neuro re ed to address controlled sit to stand and stand to sit with emphasis on SLOW controlled movement (pt benefits from slow counting to 4  to stand and to sit) with hands on knees to create closed chain loop.  Pt benefits from repetition.  Stressed to pt and husband the need to employ strategies taught in clinic at home.  Pt and husband verbalized understanding.  Pt  able to transition from sitting to quadruped with supervision only.  Addresed core strength in all 4's with alternating limb lifting. Pt needs mod cues to set core before attempting to lift limb.  Transitioned into kneeling and addressd alignment and core strength and control with intermittent resistance.  Transitioned into half kneeling - pt only able to tolerate for a few minutes due to neuropathic pain in LLE.  Modified to standing on LLE with R knee on mat, no UE support with activity that required controlled slow lateral and A/P weight shifts. Also addressed functional ambulation with UE's on therapist's shoulders - pt needs min- occassional mod a due to poor core control with ambulation. Pt also highly distracted.                 OT Short Term Goals - 12/24/17 1626      OT SHORT TERM GOAL #2   Title  --      OT SHORT TERM GOAL #3   Title  --      OT SHORT TERM GOAL #4   Title  --      OT SHORT TERM GOAL #5   Title  --      OT SHORT TERM GOAL #6   Title  --      OT SHORT TERM GOAL #7   Title  Pt will complete functional task with LUE with no more than min cueing for impulsivity.--check STGs 12/14/16    Status  On-going inconsistent      OT SHORT TERM GOAL #8   Title  Pt will perform simple home maintenance task/snack prep in standing with set-up/supervision and no more than 1 v.c. for safety.    Status  Achieved      OT SHORT TERM GOAL  #9   TITLE  Pt will improve coordination for ADLs as shown by completing 9-hole peg test in less than 57mn 30sec.    Status  On-going 2 mins 43 secs        OT Long Term Goals - 12/24/17 1627      OT LONG TERM GOAL #1   Title  Pt/caregiver will be independent with updated HEP.--check LTGs 01/13/18    Time  8    Period  Weeks    Status  On-going 11/14/17:  needs further updates      OT LONG TERM GOAL #2   Title  Pt will be use LUE as nondominant assist at least 75% of the time for ADLs without cueing.    Time  8    Period  Weeks     Status  Achieved uses  75% x however needs prompting at times.  09/19/17:  met per pt  90%      OT LONG TERM GOAL #3   Title  Pt will perform toileting mod I (including transfer).    Time  8    Period  Weeks    Status  Achieved supervision.  09/19/17:  continues to need superivsion for mobility.  11/05/17  met      OT LONG TERM GOAL #4   Title  Pt will improve LUE coordination/control to improve score on box and blocks to at least 27 with LUE in 2 trials.--revised 11/14/17    Baseline  19 blocks    Time  8    Period  Weeks    Status  Revised 18, 19 blocks.  09/19/17:  25 blocks.  11/14/17:  19 blocks/inconsistent with frustration level      OT LONG TERM GOAL #5   Title  Pt will perform simple home maintenance task/snack prep in standing mod I.--updated 11/14/17    Time  8    Period  Weeks    Status  Revised Pt is not perfoming consistely, she has made a sandwich in seated.  11/14/17:  approximating, supervision/min A and cueing      OT LONG TERM GOAL #6   Title  Pt will perform simple environmental scanning/navigation with mod I.--revised 11/14/17    Time  8    Period  Weeks    Status  Revised 11/05/17  min cueing and supervision for mobility, environmental scanning with good accuracy      OT LONG TERM GOAL #7   Title  Pt will improve coordination for ADLs as shown by completing 9-hole peg test in less than 21mn.    Baseline  257m 5.44sec    Time  8    Period  Weeks    Status  New            Plan - 12/24/17 1627    Clinical Impression Statement  Pt with brighter affect today. Pt progressing toward goals and motivated to participate today.    Rehab Potential  Good    Current Impairments/barriers affecting progress:  cognitive deficits, impulsivity    OT Frequency  2x / week    OT Duration  8 weeks    OT Treatment/Interventions  Self-care/ADL training;Therapeutic exercise;DME and/or AE instruction;Therapeutic activities;Cognitive remediation/compensation;Visual/perceptual  remediation/compensation;Passive range of motion;Functional Mobility Training;Neuromuscular education;Cryotherapy;Paraffin;Energy conservation;Manual Therapy;Patient/family education;Ultrasound;Balance training;Moist Heat;Fluidtherapy    Plan  LUE functional use/ NMR core control to improve UE functional use in quadruped, kneeling, half kneeling and standing     Consulted and Agree with Plan of Care  Patient;Family member/caregiver    Family Member Consulted  husband       Patient will benefit from skilled therapeutic intervention in order to improve the following deficits and impairments:  Decreased coordination, Decreased range of motion, Difficulty walking, Abnormal gait, Decreased safety awareness, Impaired sensation, Decreased knowledge of precautions, Decreased balance, Decreased knowledge of use of DME, Impaired UE functional use, Decreased cognition, Decreased mobility, Decreased strength, Impaired vision/preception, Impaired perceived functional ability  Visit Diagnosis: Other lack of coordination  Unsteadiness on feet  Other abnormalities of gait and mobility  Attention and concentration deficit  Visuospatial deficit  Other symptoms and signs involving cognitive functions following nontraumatic subarachnoid hemorrhage  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 Weakness with dizziness-  since [facility] and CVA 04/07/17 08/07/2017  . Disturbances of vision, late effect of stroke 08/06/2017  . Health education/counseling 08/05/2017  . High risk medications (not anticoagulants) long-term use 08/05/2017  . Neuritis-  R sided:  arm and leg/ body due to stroke 08/05/2017  . Elevated LDL cholesterol level 07/11/2017  . [employer] (NIH) Stroke Scale limb  ataxia score 2, ataxia present in two limbs 06/25/2017  . 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 Elevated vitamin B12 level 05/30/2017  . Vitamin D deficiency 05/29/2017  . History of tobacco abuse-  30pk yr hx - quit 04/07/17 05/21/2017  . Gait disturbance, post-stroke 05/14/2017  . Benign essential HTN   . Vitreous hemorrhage of right eye ([place])   . Adjustment disorder with mixed anxiety and depressed mood   . 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 s/p SAH (subarachnoid hemorrhage) ([place]) 04/19/2017  . 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 Hypoxia   . Subarachnoid hemorrhage due to ruptured aneurysm ([place]) 04/07/2017  . CVA (cerebral vascular accident) ([place]) 04/07/2017  . Elevated gastrin level 01/25/2012  . Hypokalemia 01/21/2012  . Nausea & vomiting 01/20/2012  . Epigastric pain 01/20/2012  . Duodenal ulcer, acute with obstruction 10/17/2011  . S/P laparoscopic cholecystectomy 10/16/2011    Quay Burow, OTR/L 12/24/2017, 4:29 PM  [facility] [address] [place], [place], [address] Phone: [phone]   Fax:  [phone]  Name: [name] Barnett MRN: [id number] Date of Birth: [birth year]

## 2017-12-24 NOTE — Therapy (Signed)
[place] [address] [place], [place], [address] Phone: [phone]   Fax:  [phone]  Physical Therapy Treatment  Patient Details  Name: CELLIE DARDIS MRN: [id number] Date of Birth: 09-30-1976 Referring Provider: Charlett Blake, MD   Encounter Date: 12/24/2017  PT End of Session - 12/24/17 1455    Visit Number  41    Number of Visits  86    Date for PT Re-Evaluation  01/13/18    Authorization Type  BCBS    PT Start Time  1450    PT Stop Time  1530    PT Time Calculation (min)  40 min    Activity Tolerance  Patient tolerated treatment well    Behavior During Therapy  [facility] for tasks assessed/performed       Past Medical History:  Diagnosis Date  . Duodenal obstruction   . GERD (gastroesophageal reflux disease)   . Hypertension   . Stroke [facility])    april 2018    Past Surgical History:  Procedure Laterality Date  . ABDOMINAL HYSTERECTOMY    . BALLOON DILATION  12/31/2011   Procedure: BALLOON DILATION;  Surgeon: Missy Sabins, MD;  Location: [facility] ENDOSCOPY;  Service: Endoscopy;  Laterality: N/A;  . CHOLECYSTECTOMY  07/28/11  . ESOPHAGOGASTRODUODENOSCOPY  10/17/2011   Procedure: ESOPHAGOGASTRODUODENOSCOPY (EGD);  Surgeon: Missy Sabins, MD;  Location[facility] ENDOSCOPY;  Service: Endoscopy;  Laterality: N/A;  . IR ANGIO INTRA EXTRACRAN SEL INTERNAL CAROTID BILAT MOD SED  04/07/2017  . IR ANGIO VERTEBRAL SEL VERTEBRAL UNI R MOD SED  04/07/2017  . IR ANGIOGRAM FOLLOW UP STUDY  04/07/2017  . IR ANGIOGRAM FOLLOW UP STUDY  04/07/2017  . IR ANGIOGRAM FOLLOW UP STUDY  04/07/2017  . IR ANGIOGRAM FOLLOW UP STUDY  04/07/2017  . IR ANGIOGRAM FOLLOW UP STUDY  04/07/2017  . IR ANGIOGRAM SELECTIVE EACH ADDITIONAL VESSEL  04/07/2017  . IR TRANSCATH/EMBOLIZ  04/07/2017  . PARS PLANA VITRECTOMY Right 05/01/2017   Procedure: PARS PLANA VITRECTOMY WITH 25 GAUGE RIGHT EYE, endolaser photocoaglation;  Surgeon: Jalene Mullet, MD;  Location:  [place];  Service: Ophthalmology;  Laterality: Right;  . RADIOLOGY WITH ANESTHESIA N/A 04/07/2017   Procedure: RADIOLOGY WITH ANESTHESIA;  Surgeon: Consuella Lose, MD;  Location: [place];  Service: Radiology;  Laterality: N/A;  . REPLACEMENT TOTAL KNEE BILATERAL  2004  . TEMPOROMANDIBULAR JOINT SURGERY     2 surgeries  . TUMOR REMOVAL    . VAGOTOMY  01/23/2012   Procedure: VAGOTOMY, antrectomy and BII;  Surgeon: Haywood Lasso, MD;  Location: [place];  Service: General;  Laterality: N/A;  Laparotomy with vagotomy.    There were no vitals filed for this visit.  Subjective Assessment - 12/24/17 1453    Subjective  No new complaints. Had nerve block on this past Friday, "It did not work". Still with pain in right leg. No falls.    Patient is accompained by:  Family member    Limitations  Standing;Walking    Currently in Pain?  Yes    Pain Score  5     Pain Location  Leg    Pain Orientation  Right    Pain Descriptors / Indicators  Aching    Pain Type  Neuropathic pain    Pain Radiating Towards  from thigh all the way down calf area    Pain Onset  More than a month ago    Pain Frequency  Constant    Aggravating  Factors   standing    Pain Relieving Factors  sitting           [facility] Adult PT Treatment/Exercise - 12/24/17 1456      Transfers   Transfers  Sit to Stand;Stand to Sit    Sit to Stand  4: Min guard;With upper extremity assist;From bed;From chair/3-in-1    Sit to Stand Details  Verbal cues for technique;Verbal cues for precautions/safety    Stand to Sit  4: Min guard;With upper extremity assist;To bed;To chair/3-in-1    Stand to Sit Details (indicate cue type and reason)  Verbal cues for sequencing;Verbal cues for safe use of DME/AE;Verbal cues for precautions/safety      Ambulation/Gait   Ambulation/Gait  Yes    Ambulation/Gait Assistance  4: Min assist    Ambulation/Gait Assistance Details  cues on posture, to slow down for improved weight shifting/balance with  noticable carryover from treadmill     Ambulation Distance (Feet)  30 Feet x1, 45 x1    Assistive device  Straight cane    Gait Pattern  Step-through pattern;Decreased arm swing - left;Decreased weight shift to left    Ambulation Surface  Level;Indoor      High Level Balance   High Level Balance Activities  Side stepping;Backward walking;Tandem walking tandem fwd/bwd    High Level Balance Comments  in parallel bars for safety: 4-6 laps each with cues on posture, ex form, weight shifting, to slow down. all with min guard to min assist for balance, mirror feedback to assist with posture, step placement with activity. occasional touch to bars for balance recovery needed.      Neuro Re-ed    Neuro Re-ed Details   NMR on treadmill with one foot on belt, contralateral foot on stable platform to the side focusing on coordination, motor planning/timing and sequencing of swing phase for LE on the belt, and full weight shift and weight acceptance to foot on platform.  Performed for 1-2 minutes each LE. then transitioned to both feet on belt for 1 minute working on reciprocal stepping, weight shifting, step placement,           PT Short Term Goals - 12/17/17 1440      PT SHORT TERM GOAL #1   Title  Pt will improve DGI by 4 points from initial assessment    Baseline  10/24, 11/24    Status  Not Met      PT SHORT TERM GOAL #2   Title  Pt will decrease falls risk with gait as indicated by increase in gait velocity with LRAD to > or = 2.0 ft/sec    Baseline  1.55 ft/sec when experiencing pain in RLE, 1.8 ft/sec RW    Status  Partially Met      PT SHORT TERM GOAL #3   Title  Pt will decrease falls risk as indicated by increase in BERG balance score to > or = 40/56    Baseline  34/56 when experiencing increased pain in RLE, 43/56 on 12/13/17    Status  Achieved      PT SHORT TERM GOAL #4   Title  Pt will ambulate >200' on indoor surfaces with cane (or no AD) and negotiate 4 stairs with one rail,  alternating sequence with min A     Baseline  12/17/17: met stair portion of gaol only.    Status  Partially Met        PT Long Term Goals - 12/19/17 1556  PT LONG TERM GOAL #1   Title  Pt and husband will demonstrate independence with HEP    Time  8    Period  Weeks    Status  Revised    Target Date  01/13/18      PT LONG TERM GOAL #2   Title  Pt will demonstrate improved balance and decreased falls risk as indicated by BERG balance score of > or = 45/56    Baseline  43/56    Time  8    Period  Weeks    Status  Revised    Target Date  01/13/18      PT LONG TERM GOAL #3   Title  Pt will ambulate with cane (or no AD) x 250' over outdoor paved surfaces with min A; will negotiate 4 stairs with one rail with alternating sequence and supervision    Time  8    Period  Weeks    Status  Revised    Target Date  01/13/18      PT LONG TERM GOAL #4   Title  Pt will report 15% improvement in Neuro QOL-LE    Baseline  31.3%    Time  8    Period  Weeks    Status  Revised      PT LONG TERM GOAL #5   Title  Pt will decrease falls risk during gait in home/community as indicated by increase in gait velocity to > or = 2.0 ft/sec with LRAD    Baseline  1.8 ft/sec with RW    Time  8    Period  Weeks    Status  Revised    Target Date  01/13/18      PT LONG TERM GOAL #6   Title  Pt will demonstrate decreased falls risk with gait as indicated by increase in DGI score to >/= 15/24 with LRAD    Baseline  11/24    Time  8    Period  Weeks    Status  Revised    Target Date  01/13/18         Plan - 12/24/17 1455    Clinical Impression Statement  Today's skilled session continued to focus on gait fluidity with cane and dynamic balance. Pt continues to demo improved stability when cues to slow down and focus on weight shifting both with gait and balance activities. Pt is making steady progress toward goals and should benefit from continued PT to progress toward unmet goals.     Rehab  Potential  Good    PT Frequency  2x / week    PT Duration  8 weeks    PT Treatment/Interventions  ADLs/Self Care Home Management;Aquatic Therapy;DME Instruction;Gait training;Stair training;Functional mobility training;Therapeutic activities;Therapeutic exercise;Balance training;Neuromuscular re-education;Patient/family education;Vestibular;Visual/perceptual remediation/compensation;Cognitive remediation;Orthotic Fit/Training    PT Next Visit Plan   have pt begin to walk to/from waiting area with cane, Did she dry run a shower?  treadmill: one LE on belt-swing phase for coordination/control; gait without AD: in // bars, side stepping, retro, Physioball exercises for postural control.      Consulted and Agree with Plan of Care  Patient;Family member/caregiver    Family Member Consulted  husband       Patient will benefit from skilled therapeutic intervention in order to improve the following deficits and impairments:  Abnormal gait, Decreased balance, Decreased cognition, Decreased coordination, Decreased strength, Difficulty walking, Dizziness, Impaired sensation, Impaired vision/preception, Pain, Decreased activity tolerance, Decreased mobility  Visit  Diagnosis: Other abnormalities of gait and mobility  Unsteadiness on feet  Ataxic gait  Other lack of coordination     Problem List Patient Active Problem List   Diagnosis Date Noted  . 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 Weakness with dizziness-  since [facility] and CVA 04/07/17 08/07/2017  . Disturbances of vision, late effect of stroke 08/06/2017  . Health education/counseling 08/05/2017  . High risk medications (not anticoagulants) long-term use 08/05/2017  . Neuritis-  R sided:  arm and leg/ body due to stroke 08/05/2017  . Elevated LDL cholesterol level 07/11/2017  . [employer] (NIH) Stroke Scale limb ataxia score 2, ataxia present in two limbs 06/25/2017  . Alteration of sensation as late effect of stroke 06/25/2017   . Elevated vitamin B12 level 05/30/2017  . Vitamin D deficiency 05/29/2017  . History of tobacco abuse-  30pk yr hx - quit 04/07/17 05/21/2017  . Gait disturbance, post-stroke 05/14/2017  . Benign essential HTN   . Vitreous hemorrhage of right eye ([place])   . Adjustment disorder with mixed anxiety and depressed mood   . 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 s/p SAH (subarachnoid hemorrhage) ([place]) 04/19/2017  . 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 Hypoxia   . Subarachnoid hemorrhage due to ruptured aneurysm ([place]) 04/07/2017  . CVA (cerebral vascular accident) ([place]) 04/07/2017  . Elevated gastrin level 01/25/2012  . Hypokalemia 01/21/2012  . Nausea & vomiting 01/20/2012  . Epigastric pain 01/20/2012  . Duodenal ulcer, acute with obstruction 10/17/2011  . S/P laparoscopic cholecystectomy 10/16/2011    Willow Ora, PTA, [facility] [address], [place], [place] [address] [phone] 12/25/17, 2:32 PM   Name: SARIKA BALDINI MRN: [id number] Date of Birth: Nov 01, 1976

## 2017-12-26 ENCOUNTER — Ambulatory Visit: Payer: BLUE CROSS/BLUE SHIELD | Admitting: Physical Therapy

## 2017-12-26 ENCOUNTER — Encounter: Payer: Self-pay | Admitting: Occupational Therapy

## 2017-12-26 ENCOUNTER — Ambulatory Visit: Payer: BLUE CROSS/BLUE SHIELD | Admitting: Occupational Therapy

## 2017-12-26 DIAGNOSIS — R278 Other lack of coordination: Secondary | ICD-10-CM

## 2017-12-26 DIAGNOSIS — R270 Ataxia, unspecified: Secondary | ICD-10-CM

## 2017-12-26 DIAGNOSIS — R2689 Other abnormalities of gait and mobility: Secondary | ICD-10-CM

## 2017-12-26 DIAGNOSIS — R260 Ataxic gait: Secondary | ICD-10-CM | POA: Diagnosis not present

## 2017-12-26 DIAGNOSIS — I69018 Other symptoms and signs involving cognitive functions following nontraumatic subarachnoid hemorrhage: Secondary | ICD-10-CM

## 2017-12-26 DIAGNOSIS — R2681 Unsteadiness on feet: Secondary | ICD-10-CM

## 2017-12-26 DIAGNOSIS — R41842 Visuospatial deficit: Secondary | ICD-10-CM

## 2017-12-26 DIAGNOSIS — I69054 Hemiplegia and hemiparesis following nontraumatic subarachnoid hemorrhage affecting left non-dominant side: Secondary | ICD-10-CM

## 2017-12-26 DIAGNOSIS — R41840 Attention and concentration deficit: Secondary | ICD-10-CM

## 2017-12-26 DIAGNOSIS — R42 Dizziness and giddiness: Secondary | ICD-10-CM

## 2017-12-26 DIAGNOSIS — R414 Neurologic neglect syndrome: Secondary | ICD-10-CM

## 2017-12-26 DIAGNOSIS — R208 Other disturbances of skin sensation: Secondary | ICD-10-CM

## 2017-12-26 NOTE — Therapy (Signed)
[place] [address] [place], [place], [address] Phone: [phone]   Fax:  [phone]  Occupational Therapy Treatment  Patient Details  Name: [name] Barnett MRN: [id number] Date of Birth: 04-23-1976 Referring Provider: Charlett Blake, MD   Encounter Date: 12/26/2017  OT End of Session - 12/26/17 1456    Visit Number  37    Number of Visits  18    Date for OT Re-Evaluation  01/13/18    Authorization Type  BCBS, no visit limit/auth req.    Authorization Time Period  renewal completed 11/14/17 for 8 wks    OT Start Time  1448    OT Stop Time  1532    OT Time Calculation (min)  44 min    Activity Tolerance  Patient tolerated treatment well    Behavior During Therapy  [facility] for tasks assessed/performed       Past Medical History:  Diagnosis Date  . Duodenal obstruction   . GERD (gastroesophageal reflux disease)   . Hypertension   . Stroke [facility])    april 2018    Past Surgical History:  Procedure Laterality Date  . ABDOMINAL HYSTERECTOMY    . BALLOON DILATION  12/31/2011   Procedure: BALLOON DILATION;  Surgeon: Missy Sabins, MD;  Location: [facility] ENDOSCOPY;  Service: Endoscopy;  Laterality: N/A;  . CHOLECYSTECTOMY  07/28/11  . ESOPHAGOGASTRODUODENOSCOPY  10/17/2011   Procedure: ESOPHAGOGASTRODUODENOSCOPY (EGD);  Surgeon: Missy Sabins, MD;  Location: [facility] ENDOSCOPY;  Service: Endoscopy;  Laterality: N/A;  . IR ANGIO INTRA EXTRACRAN SEL INTERNAL CAROTID BILAT MOD SED  04/07/2017  . IR ANGIO VERTEBRAL SEL VERTEBRAL UNI R MOD SED  04/07/2017  . IR ANGIOGRAM FOLLOW UP STUDY  04/07/2017  . IR ANGIOGRAM FOLLOW UP STUDY  04/07/2017  . IR ANGIOGRAM FOLLOW UP STUDY  04/07/2017  . IR ANGIOGRAM FOLLOW UP STUDY  04/07/2017  . IR ANGIOGRAM FOLLOW UP STUDY  04/07/2017  . IR ANGIOGRAM SELECTIVE EACH ADDITIONAL VESSEL  04/07/2017  . IR TRANSCATH/EMBOLIZ  04/07/2017  . PARS PLANA VITRECTOMY Right 05/01/2017   Procedure: PARS PLANA  VITRECTOMY WITH 25 GAUGE RIGHT EYE, endolaser photocoaglation;  Surgeon: Jalene Mullet, MD;  Location: [place];  Service: Ophthalmology;  Laterality: Right;  . RADIOLOGY WITH ANESTHESIA N/A 04/07/2017   Procedure: RADIOLOGY WITH ANESTHESIA;  Surgeon: Consuella Lose, MD;  Location: [place];  Service: Radiology;  Laterality: N/A;  . REPLACEMENT TOTAL KNEE BILATERAL  2004  . TEMPOROMANDIBULAR JOINT SURGERY     2 surgeries  . TUMOR REMOVAL    . VAGOTOMY  01/23/2012   Procedure: VAGOTOMY, antrectomy and BII;  Surgeon: Haywood Lasso, MD;  Location: [place];  Service: General;  Laterality: N/A;  Laparotomy with vagotomy.    There were no vitals filed for this visit.  Subjective Assessment - 12/26/17 1453    Subjective   nerve block didn't work.  Leg really hurting after OT Tues.    Patient is accompained by:  Family member husband    Pertinent History  Subarachnoid hemorrhage due to ruptured aneurysm, ataxia (L cerebellar); HTN, R side decr sensation, vertical diplopia/visual deficits, ataxia, Bilateral retinal hemorhage associated with SAH (with surgery on R eye in hospital and L approx 1 month ago)    Limitations  visual deficits, fall risk, impulsivity/cognitive deficits    Patient Stated Goals  be able to walk and use L arm    Currently in Pain?  Yes    Pain Score  10-Worst pain ever    Pain Location  Leg    Pain Orientation  Right    Pain Descriptors / Indicators  Aching    Pain Type  Neuropathic pain    Pain Onset  More than a month ago    Pain Frequency  Constant    Aggravating Factors   standing    Pain Relieving Factors  sitting         In sitting, flipping cards with min cueing for slow, controlled movements with LUE.  Stacking/unstacking up to 10 blocks (multiple attempts)  Shuffling cards for bilateral coordination.     Making bed in standing and quadruped on bed to spread out sheet/blanket with CGA and min A x1 for LOB.  Pt also given min v.c. For attention to task/LUE.     Ambulating with min A and cueing for safety with RW.   Min v.c. Throughout session for attention/redirection due to external distractions.              OT Short Term Goals - 12/24/17 1626      OT SHORT TERM GOAL #2   Title  --      OT SHORT TERM GOAL #3   Title  --      OT SHORT TERM GOAL #4   Title  --      OT SHORT TERM GOAL #5   Title  --      OT SHORT TERM GOAL #6   Title  --      OT SHORT TERM GOAL #7   Title  Pt will complete functional task with LUE with no more than min cueing for impulsivity.--check STGs 12/14/16    Status  On-going inconsistent      OT SHORT TERM GOAL #8   Title  Pt will perform simple home maintenance task/snack prep in standing with set-up/supervision and no more than 1 v.c. for safety.    Status  Achieved      OT SHORT TERM GOAL  #9   TITLE  Pt will improve coordination for ADLs as shown by completing 9-hole peg test in less than 78mn 30sec.    Status  On-going 2 mins 43 secs        OT Long Term Goals - 12/24/17 1627      OT LONG TERM GOAL #1   Title  Pt/caregiver will be independent with updated HEP.--check LTGs 01/13/18    Time  8    Period  Weeks    Status  On-going 11/14/17:  needs further updates      OT LONG TERM GOAL #2   Title  Pt will be use LUE as nondominant assist at least 75% of the time for ADLs without cueing.    Time  8    Period  Weeks    Status  Achieved uses 75% x however needs prompting at times.  09/19/17:  met per pt  90%      OT LONG TERM GOAL #3   Title  Pt will perform toileting mod I (including transfer).    Time  8    Period  Weeks    Status  Achieved supervision.  09/19/17:  continues to need superivsion for mobility.  11/05/17  met      OT LONG TERM GOAL #4   Title  Pt will improve LUE coordination/control to improve score on box and blocks to at least 27 with LUE in 2 trials.--revised 11/14/17    Baseline  19 blocks    Time  8    Period  Weeks    Status  Revised 18, 19 blocks.   09/19/17:  25 blocks.  11/14/17:  19 blocks/inconsistent with frustration level      OT LONG TERM GOAL #5   Title  Pt will perform simple home maintenance task/snack prep in standing mod I.--updated 11/14/17    Time  8    Period  Weeks    Status  Revised Pt is not perfoming consistely, she has made a sandwich in seated.  11/14/17:  approximating, supervision/min A and cueing      OT LONG TERM GOAL #6   Title  Pt will perform simple environmental scanning/navigation with mod I.--revised 11/14/17    Time  8    Period  Weeks    Status  Revised 11/05/17  min cueing and supervision for mobility, environmental scanning with good accuracy      OT LONG TERM GOAL #7   Title  Pt will improve coordination for ADLs as shown by completing 9-hole peg test in less than 36mn.    Baseline  236m 5.44sec    Time  8    Period  Weeks    Status  New            Plan - 12/26/17 1457    Clinical Impression Statement  Pt progressing slowly towards goals with improving LUE control, decr impulsivity, and improved attention.  However, pt reports significant pain with no relief from nerve blocks.    Rehab Potential  Good    Current Impairments/barriers affecting progress:  cognitive deficits, impulsivity    OT Frequency  2x / week    OT Duration  8 weeks    OT Treatment/Interventions  Self-care/ADL training;Therapeutic exercise;DME and/or AE instruction;Therapeutic activities;Cognitive remediation/compensation;Visual/perceptual remediation/compensation;Passive range of motion;Functional Mobility Training;Neuromuscular education;Cryotherapy;Paraffin;Energy conservation;Manual Therapy;Patient/family education;Ultrasound;Balance training;Moist Heat;Fluidtherapy    Plan  LUE functional use/ Neuro re-ed core control to improve UE functional use     OT Home Exercise Plan  Education provided:  HEP for LUE control, coordination, and functional use    Consulted and Agree with Plan of Care  Patient;Family member/caregiver     Family Member Consulted  husband       Patient will benefit from skilled therapeutic intervention in order to improve the following deficits and impairments:  Decreased coordination, Decreased range of motion, Difficulty walking, Abnormal gait, Decreased safety awareness, Impaired sensation, Decreased knowledge of precautions, Decreased balance, Decreased knowledge of use of DME, Impaired UE functional use, Decreased cognition, Decreased mobility, Decreased strength, Impaired vision/preception, Impaired perceived functional ability  Visit Diagnosis: Other lack of coordination  Other abnormalities of gait and mobility  Unsteadiness on feet  Attention and concentration deficit  Visuospatial deficit  Other symptoms and signs involving cognitive functions following nontraumatic subarachnoid hemorrhage  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 Weakness with dizziness-  [facility] 04[facility] 08/07/2017  . Disturbances of vision, late effect of stroke 08/06/2017  . Health education/counseling 08/05/2017  . High risk medications (not anticoagulants) long-term use 08/05/2017  . Neuritis-  R sided:  arm and leg/ body due to stroke 08/05/2017  . Elevated LDL cholesterol level 07/11/2017  . [employer] (NIH) Stroke Scale limb ataxia score  2, ataxia present in two limbs 06/25/2017  . 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 Elevated vitamin B12 level 05/30/2017  . Vitamin D deficiency 05/29/2017  . History of tobacco abuse-  30pk yr hx - quit 04/07/17 05/21/2017  . Gait disturbance, post-stroke 05/14/2017  . Benign essential HTN   . Vitreous hemorrhage of right eye ([place])   . Adjustment  disorder with mixed anxiety and depressed mood   . 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 s/p SAH (subarachnoid hemorrhage) ([place]) 04/19/2017  . 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 Hypoxia   . Subarachnoid hemorrhage due to ruptured aneurysm ([place]) 04/07/2017  . CVA (cerebral vascular accident) ([place]) 04/07/2017  . Elevated gastrin level 01/25/2012  . Hypokalemia 01/21/2012  . Nausea & vomiting 01/20/2012  . Epigastric pain 01/20/2012  . Duodenal ulcer, acute with obstruction 10/17/2011  . S/P laparoscopic cholecystectomy 10/16/2011    [facility] 12/26/2017, 5:40 PM  [place] [address] [place], [place], [address] Phone: [phone]   Fax:  [phone]  Name: ROSANGELA FEHRENBACH MRN: [id number] Date of Birth: 12/19/1975   Vianne Bulls, OTR/L [facility] [address] [place], [place]  [address] [phone] phone [phone] 12/26/17 5:40 PM

## 2017-12-26 NOTE — Therapy (Signed)
[facility] [address] [place], [place], [address] Phone: [phone]   Fax:  [phone]  Physical Therapy Treatment  Patient Details  Name: [name] Barnett MRN: [id number] Date of Birth: 1976-10-21 Referring Provider: Charlett Blake, MD   Encounter Date: 12/26/2017  PT End of Session - 12/26/17 1619    Visit Number  42    Number of Visits  77    Date for PT Re-Evaluation  01/13/18    Authorization Type  BCBS    PT Start Time  1535    PT Stop Time  1616    PT Time Calculation (min)  41 min    Activity Tolerance  Patient tolerated treatment well    Behavior During Therapy  [facility] for tasks assessed/performed       Past Medical History:  Diagnosis Date  . Duodenal obstruction   . GERD (gastroesophageal reflux disease)   . Hypertension   . Stroke [facility])    april 2018    Past Surgical History:  Procedure Laterality Date  . ABDOMINAL HYSTERECTOMY    . BALLOON DILATION  12/31/2011   Procedure: BALLOON DILATION;  Surgeon: Missy Sabins, MD;  Location: [facility] ENDOSCOPY;  Service: Endoscopy;  Laterality: N/A;  . CHOLECYSTECTOMY  07/28/11  . ESOPHAGOGASTRODUODENOSCOPY  10/17/2011   Procedure: ESOPHAGOGASTRODUODENOSCOPY (EGD);  Surgeon: Missy Sabins, MD;  Location: [facility] ENDOSCOPY;  Service: Endoscopy;  Laterality: N/A;  . IR ANGIO INTRA EXTRACRAN SEL INTERNAL CAROTID BILAT MOD SED  04/07/2017  . IR ANGIO VERTEBRAL SEL VERTEBRAL UNI R MOD SED  04/07/2017  . IR ANGIOGRAM FOLLOW UP STUDY  04/07/2017  . IR ANGIOGRAM FOLLOW UP STUDY  04/07/2017  . IR ANGIOGRAM FOLLOW UP STUDY  04/07/2017  . IR ANGIOGRAM FOLLOW UP STUDY  04/07/2017  . IR ANGIOGRAM FOLLOW UP STUDY  04/07/2017  . IR ANGIOGRAM SELECTIVE EACH ADDITIONAL VESSEL  04/07/2017  . IR TRANSCATH/EMBOLIZ  04/07/2017  . PARS PLANA VITRECTOMY Right 05/01/2017   Procedure: PARS PLANA VITRECTOMY WITH 25 GAUGE RIGHT EYE, endolaser photocoaglation;  Surgeon: Jalene Mullet, MD;  Location:  [facility];  Service: Ophthalmology;  Laterality: Right;  . RADIOLOGY WITH ANESTHESIA N/A 04/07/2017   Procedure: RADIOLOGY WITH ANESTHESIA;  Surgeon: Consuella Lose, MD;  Location: [place];  Service: Radiology;  Laterality: N/A;  . REPLACEMENT TOTAL KNEE BILATERAL  2004  . TEMPOROMANDIBULAR JOINT SURGERY     2 surgeries  . TUMOR REMOVAL    . VAGOTOMY  01/23/2012   Procedure: VAGOTOMY, antrectomy and BII;  Surgeon: Haywood Lasso, MD;  Location: [place];  Service: General;  Laterality: N/A;  Laparotomy with vagotomy.    There were no vitals filed for this visit.  Subjective Assessment - 12/26/17 1538    Subjective  Pain remains the same; increased pain after OT on Tuesday due to having to put RLE in flexed position during balance work.      Patient is accompained by:  Family member    Limitations  Standing;Walking    Currently in Pain?  Yes    Pain Score  10-Worst pain ever    Pain Location  Leg    Pain Orientation  Right    Pain Descriptors / Indicators  Burning    Pain Type  Neuropathic pain    Pain Onset  More than a month ago  Balance Exercises - 12/26/17 1603      Balance Exercises: Standing   Standing Eyes Opened  Narrow base of support (BOS);Head turns;Solid surface;5 reps    Tandem Gait  Forward;Upper extremity support;4 reps    Retro Gait  Upper extremity support;4 reps UE and then no UE support    Sidestepping  Upper extremity support;4 reps with squat; with and then without UE support      Standing at counter top beginning with UE support progressing to no UE support with min A and verbal cues for squat, full lateral and backwards weight shifting and SLS stance during side stepping and retro stepping.  In corner with narrow BOS pt required min A and cues to self-correct balance to L.     PT Short Term Goals - 12/17/17 1440      PT SHORT TERM GOAL #1   Title  Pt will improve DGI by 4 points from initial assessment     Baseline  10/24, 11/24    Status  Not Met      PT SHORT TERM GOAL #2   Title  Pt will decrease falls risk with gait as indicated by increase in gait velocity with LRAD to > or = 2.0 ft/sec    Baseline  1.55 ft/sec when experiencing pain in RLE, 1.8 ft/sec RW    Status  Partially Met      PT SHORT TERM GOAL #3   Title  Pt will decrease falls risk as indicated by increase in BERG balance score to > or = 40/56    Baseline  34/56 when experiencing increased pain in RLE, 43/56 on 12/13/17    Status  Achieved      PT SHORT TERM GOAL #4   Title  Pt will ambulate >200' on indoor surfaces with cane (or no AD) and negotiate 4 stairs with one rail, alternating sequence with min A     Baseline  12/17/17: met stair portion of gaol only.    Status  Partially Met        PT Long Term Goals - 12/19/17 1556      PT LONG TERM GOAL #1   Title  Pt and husband will demonstrate independence with HEP    Time  8    Period  Weeks    Status  Revised    Target Date  01/13/18      PT LONG TERM GOAL #2   Title  Pt will demonstrate improved balance and decreased falls risk as indicated by BERG balance score of > or = 45/56    Baseline  43/56    Time  8    Period  Weeks    Status  Revised    Target Date  01/13/18      PT LONG TERM GOAL #3   Title  Pt will ambulate with cane (or no AD) x 250' over outdoor paved surfaces with min A; will negotiate 4 stairs with one rail with alternating sequence and supervision    Time  8    Period  Weeks    Status  Revised    Target Date  01/13/18      PT LONG TERM GOAL #4   Title  Pt will report 15% improvement in Neuro QOL-LE    Baseline  31.3%    Time  8    Period  Weeks    Status  Revised      PT LONG TERM GOAL #5   Title  Pt will decrease falls risk during gait in home/community as indicated by increase in gait velocity to > or = 2.0 ft/sec with LRAD    Baseline  1.8 ft/sec with RW    Time  8    Period  Weeks    Status  Revised    Target Date  01/13/18       PT LONG TERM GOAL #6   Title  Pt will demonstrate decreased falls risk with gait as indicated by increase in DGI score to >/= 15/24 with LRAD    Baseline  11/24    Time  8    Period  Weeks    Status  Revised    Target Date  01/13/18            Plan - 12/26/17 1619    Clinical Impression Statement  Pt continues to be somewhat limited by pain in RLE.  Continued to focus on increasing WB through LE, controlled weight shifting and stepping in various directions, balance and midline orientation with more narrow BOS during head turns.  Performing balance exercises in corner pt demonstrated slight improvement in ability to self-monitor LOB and return to midline and maintain during 5 reps head nods and turnsPt will benefit from ongoing PT to progress towards LTG.    Rehab Potential  Good    PT Frequency  2x / week    PT Duration  8 weeks    PT Treatment/Interventions  ADLs/Self Care Home Management;Aquatic Therapy;DME Instruction;Gait training;Stair training;Functional mobility training;Therapeutic activities;Therapeutic exercise;Balance training;Neuromuscular re-education;Patient/family education;Vestibular;Visual/perceptual remediation/compensation;Cognitive remediation;Orthotic Fit/Training    PT Next Visit Plan   have pt begin to walk to/from waiting area without AD, Did she dry run a shower? standing or tall kneeling - graded resistance during weight shifting various directions. Corner balance exercises with more narrow BOS/head turns.  treadmill: one LE on belt-swing phase for coordination/control; Physioball exercises for postural control.      Consulted and Agree with Plan of Care  Patient;Family member/caregiver    Family Member Consulted  husband       Patient will benefit from skilled therapeutic intervention in order to improve the following deficits and impairments:  Abnormal gait, Decreased balance, Decreased cognition, Decreased coordination, Decreased strength, Difficulty  walking, Dizziness, Impaired sensation, Impaired vision/preception, Pain, Decreased activity tolerance, Decreased mobility  Visit Diagnosis: Ataxic gait  Other lack of coordination  Other abnormalities of gait and mobility  Unsteadiness on feet  Dizziness and giddiness     Problem List Patient Active Problem List   Diagnosis Date Noted  . 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 Weakness with dizziness-  since [facility] and CVA 04/07/17 08/07/2017  . Disturbances of vision, late effect of stroke 08/06/2017  . Health education/counseling 08/05/2017  . High risk medications (not anticoagulants) long-term use 08/05/2017  . Neuritis-  R sided:  arm and leg/ body due to stroke 08/05/2017  . Elevated LDL cholesterol level 07/11/2017  . [employer] (NIH) Stroke Scale limb ataxia score 2, ataxia present in two limbs 06/25/2017  . 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 Elevated vitamin B12 level 05/30/2017  . Vitamin D deficiency 05/29/2017  . History of tobacco abuse-  30pk yr hx - quit 04/07/17 05/21/2017  . Gait disturbance, post-stroke 05/14/2017  . Benign essential HTN   . Vitreous hemorrhage of right eye ([place])   . Adjustment disorder with mixed anxiety and depressed mood   . 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 s/p SAH (subarachnoid hemorrhage) ([place]) 04/19/2017  . 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 Hypoxia   . Subarachnoid hemorrhage due to ruptured aneurysm ([place]) 04/07/2017  . CVA (cerebral vascular accident) ([place]) 04/07/2017  . Elevated gastrin level 01/25/2012  . Hypokalemia 01/21/2012  . Nausea & vomiting 01/20/2012  . Epigastric pain 01/20/2012  . Duodenal ulcer, acute with obstruction 10/17/2011  . S/P laparoscopic cholecystectomy 10/16/2011    Rico Junker, PT, DPT 12/26/17    4:40 PM    [facility] [address] [place], [place], [address] Phone: [phone]   Fax:  [phone]  Name: [name] Barnett MRN: [id number] Date of Birth: Mar 02, 1976

## 2017-12-27 ENCOUNTER — Other Ambulatory Visit: Payer: Self-pay | Admitting: Neurosurgery

## 2017-12-27 DIAGNOSIS — I604 Nontraumatic subarachnoid hemorrhage from basilar artery: Secondary | ICD-10-CM

## 2017-12-30 ENCOUNTER — Other Ambulatory Visit: Payer: Self-pay | Admitting: Physical Medicine & Rehabilitation

## 2017-12-30 ENCOUNTER — Other Ambulatory Visit: Payer: Self-pay | Admitting: Family Medicine

## 2017-12-30 DIAGNOSIS — I10 Essential (primary) hypertension: Secondary | ICD-10-CM

## 2017-12-31 ENCOUNTER — Ambulatory Visit: Payer: BLUE CROSS/BLUE SHIELD | Admitting: Occupational Therapy

## 2017-12-31 ENCOUNTER — Encounter: Payer: Self-pay | Admitting: Physical Therapy

## 2017-12-31 ENCOUNTER — Ambulatory Visit: Payer: BLUE CROSS/BLUE SHIELD | Admitting: Physical Therapy

## 2017-12-31 DIAGNOSIS — R2681 Unsteadiness on feet: Secondary | ICD-10-CM

## 2017-12-31 DIAGNOSIS — R260 Ataxic gait: Secondary | ICD-10-CM | POA: Diagnosis not present

## 2017-12-31 DIAGNOSIS — R41840 Attention and concentration deficit: Secondary | ICD-10-CM

## 2017-12-31 DIAGNOSIS — I69054 Hemiplegia and hemiparesis following nontraumatic subarachnoid hemorrhage affecting left non-dominant side: Secondary | ICD-10-CM

## 2017-12-31 DIAGNOSIS — R2689 Other abnormalities of gait and mobility: Secondary | ICD-10-CM

## 2017-12-31 DIAGNOSIS — R278 Other lack of coordination: Secondary | ICD-10-CM

## 2017-12-31 DIAGNOSIS — R41842 Visuospatial deficit: Secondary | ICD-10-CM

## 2017-12-31 DIAGNOSIS — I69018 Other symptoms and signs involving cognitive functions following nontraumatic subarachnoid hemorrhage: Secondary | ICD-10-CM

## 2018-01-01 ENCOUNTER — Other Ambulatory Visit: Payer: Self-pay

## 2018-01-01 ENCOUNTER — Telehealth: Payer: Self-pay | Admitting: *Deleted

## 2018-01-01 ENCOUNTER — Other Ambulatory Visit: Payer: Self-pay | Admitting: *Deleted

## 2018-01-01 DIAGNOSIS — I10 Essential (primary) hypertension: Secondary | ICD-10-CM

## 2018-01-01 MED ORDER — TOPIRAMATE 50 MG PO TABS: 50.0000 mg | ORAL_TABLET | Freq: Two times a day (BID) | ORAL | 0 refills | Status: DC

## 2018-01-01 NOTE — Therapy (Signed)
[facility] [address] [place], [place], [address] Phone: [phone]   Fax:  [phone]  Occupational Therapy Treatment  Patient Details  Name: [name] Barnett MRN: [id number] Date of Birth: 28-Mar-1976 Referring Provider: Charlett Blake, MD   Encounter Date: 12/31/2017  OT End of Session - 01/01/18 0948    Visit Number  51    Number of Visits  28    Date for OT Re-Evaluation  01/13/18    Authorization Type  BCBS, no visit limit/auth req.    Authorization Time Period  renewal completed 11/14/17 for 8 wks    OT Start Time  1535    OT Stop Time  1615    OT Time Calculation (min)  40 min    Activity Tolerance  Patient tolerated treatment well    Behavior During Therapy  WFL for tasks assessed/performed       Past Medical History:  Diagnosis Date  . Duodenal obstruction   . GERD (gastroesophageal reflux disease)   . Hypertension   . Stroke [facility])    april 2018    Past Surgical History:  Procedure Laterality Date  . ABDOMINAL HYSTERECTOMY    . BALLOON DILATION  12/31/2011   Procedure: BALLOON DILATION;  Surgeon: Missy Sabins, MD;  Location: [facility] ENDOSCOPY;  Service: Endoscopy;  Laterality: N/A;  . CHOLECYSTECTOMY  07/28/11  . ESOPHAGOGASTRODUODENOSCOPY  10/17/2011   Procedure: ESOPHAGOGASTRODUODENOSCOPY (EGD);  Surgeon: Missy Sabins, MD;  Location: [facility] ENDOSCOPY;  Service: Endoscopy;  Laterality: N/A;  . IR ANGIO INTRA EXTRACRAN SEL INTERNAL CAROTID BILAT MOD SED  04/07/2017  . IR ANGIO VERTEBRAL SEL VERTEBRAL UNI R MOD SED  04/07/2017  . IR ANGIOGRAM FOLLOW UP STUDY  04/07/2017  . IR ANGIOGRAM FOLLOW UP STUDY  04/07/2017  . IR ANGIOGRAM FOLLOW UP STUDY  04/07/2017  . IR ANGIOGRAM FOLLOW UP STUDY  04/07/2017  . IR ANGIOGRAM FOLLOW UP STUDY  04/07/2017  . IR ANGIOGRAM SELECTIVE EACH ADDITIONAL VESSEL  04/07/2017  . IR TRANSCATH/EMBOLIZ  04/07/2017  . PARS PLANA VITRECTOMY Right 05/01/2017   Procedure: PARS PLANA  VITRECTOMY WITH 25 GAUGE RIGHT EYE, endolaser photocoaglation;  Surgeon: Jalene Mullet, MD;  Location: [place];  Service: Ophthalmology;  Laterality: Right;  . RADIOLOGY WITH ANESTHESIA N/A 04/07/2017   Procedure: RADIOLOGY WITH ANESTHESIA;  Surgeon: Consuella Lose, MD;  Location: [place];  Service: Radiology;  Laterality: N/A;  . REPLACEMENT TOTAL KNEE BILATERAL  2004  . TEMPOROMANDIBULAR JOINT SURGERY     2 surgeries  . TUMOR REMOVAL    . VAGOTOMY  01/23/2012   Procedure: VAGOTOMY, antrectomy and BII;  Surgeon: Haywood Lasso, MD;  Location: [place];  Service: General;  Laterality: N/A;  Laparotomy with vagotomy.    There were no vitals filed for this visit.  Subjective Assessment - 01/01/18 0947    Pertinent History  Subarachnoid hemorrhage due to ruptured aneurysm, ataxia (L cerebellar); HTN, R side decr sensation, vertical diplopia/visual deficits, ataxia, Bilateral retinal hemorhage associated with SAH (with surgery on R eye in hospital and L approx 1 month ago)    Patient Stated Goals  be able to walk and use L arm    Currently in Pain?  Yes    Pain Score  7     Pain Location  Leg    Pain Orientation  Right    Pain Descriptors / Indicators  Aching    Pain Type  Neuropathic pain    Pain Onset  More than a month ago    Pain Frequency  Constant    Aggravating Factors   unknown    Pain Relieving Factors  repositioning           Treatment: tall kneeling over bench to place large pegs in vertical pegboard with LUE, min difficulty, min-mod v.c to slow down, rocking forwards and backwards in quadraped Seated on balance disc with ball between knees reaching with LUE to place / remove large pegs, min-mod difficulty/ v.c Arm bike x 5 mins level 3 for conditioning Standing to place graded clothespins on vertical antennae, mod v.c for weightshift to left.                  OT Short Term Goals - 12/24/17 1626      OT SHORT TERM GOAL #2   Title  --      OT SHORT  TERM GOAL #3   Title  --      OT SHORT TERM GOAL #4   Title  --      OT SHORT TERM GOAL #5   Title  --      OT SHORT TERM GOAL #6   Title  --      OT SHORT TERM GOAL #7   Title  Pt will complete functional task with LUE with no more than min cueing for impulsivity.--check STGs 12/14/16    Status  On-going inconsistent      OT SHORT TERM GOAL #8   Title  Pt will perform simple home maintenance task/snack prep in standing with set-up/supervision and no more than 1 v.c. for safety.    Status  Achieved      OT SHORT TERM GOAL  #9   TITLE  Pt will improve coordination for ADLs as shown by completing 9-hole peg test in less than 32mn 30sec.    Status  On-going 2 mins 43 secs        OT Long Term Goals - 12/24/17 1627      OT LONG TERM GOAL #1   Title  Pt/caregiver will be independent with updated HEP.--check LTGs 01/13/18    Time  8    Period  Weeks    Status  On-going 11/14/17:  needs further updates      OT LONG TERM GOAL #2   Title  Pt will be use LUE as nondominant assist at least 75% of the time for ADLs without cueing.    Time  8    Period  Weeks    Status  Achieved uses 75% x however needs prompting at times.  09/19/17:  met per pt  90%      OT LONG TERM GOAL #3   Title  Pt will perform toileting mod I (including transfer).    Time  8    Period  Weeks    Status  Achieved supervision.  09/19/17:  continues to need superivsion for mobility.  11/05/17  met      OT LONG TERM GOAL #4   Title  Pt will improve LUE coordination/control to improve score on box and blocks to at least 27 with LUE in 2 trials.--revised 11/14/17    Baseline  19 blocks    Time  8    Period  Weeks    Status  Revised 18, 19 blocks.  09/19/17:  25 blocks.  11/14/17:  19 blocks/inconsistent with frustration level      OT LONG TERM GOAL #5   Title  Pt will perform simple home maintenance task/snack prep in standing mod I.--updated 11/14/17    Time  8    Period  Weeks    Status  Revised Pt is not  perfoming consistely, she has made a sandwich in seated.  11/14/17:  approximating, supervision/min A and cueing      OT LONG TERM GOAL #6   Title  Pt will perform simple environmental scanning/navigation with mod I.--revised 11/14/17    Time  8    Period  Weeks    Status  Revised 11/05/17  min cueing and supervision for mobility, environmental scanning with good accuracy      OT LONG TERM GOAL #7   Title  Pt will improve coordination for ADLs as shown by completing 9-hole peg test in less than 79mn.    Baseline  213m 5.44sec    Time  8    Period  Weeks    Status  New            Plan - 01/01/18 [id number]  Clinical Impression Statement  Pt progressing slowly towards goals with improving LUE control with functional activity.    Rehab Potential  Good    Current Impairments/barriers affecting progress:  cognitive deficits, impulsivity    OT Frequency  2x / week    OT Duration  8 weeks    OT Treatment/Interventions  Self-care/ADL training;Therapeutic exercise;DME and/or AE instruction;Therapeutic activities;Cognitive remediation/compensation;Visual/perceptual remediation/compensation;Passive range of motion;Functional Mobility Training;Neuromuscular education;Cryotherapy;Paraffin;Energy conservation;Manual Therapy;Patient/family education;Ultrasound;Balance training;Moist Heat;Fluidtherapy    Plan   discuss with pt plans to schedule more visits vs take a break/ LUE functional use/ Neuro re-ed core control to improve UE functional use     Consulted and Agree with Plan of Care  Patient;Family member/caregiver    Family Member Consulted  mother       Patient will benefit from skilled therapeutic intervention in order to improve the following deficits and impairments:  Decreased coordination, Decreased range of motion, Difficulty walking, Abnormal gait, Decreased safety awareness, Impaired sensation, Decreased knowledge of precautions, Decreased balance, Decreased knowledge of use of DME,  Impaired UE functional use, Decreased cognition, Decreased mobility, Decreased strength, Impaired vision/preception, Impaired perceived functional ability  Visit Diagnosis: No diagnosis found.    Problem List Patient Active Problem List   Diagnosis Date Noted  . 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 [facility]iness-  [facility] 04[facility] 08/07/2017  . Disturbances of vision, late effect of stroke 08/06/2017  . Health education/counseling 08/05/2017  . High risk medications (not anticoagulants) long-term use 08/05/2017  . Neuritis-  R sided:  arm and leg/ body due to stroke 08/05/2017  . Elevated LDL cholesterol level 07/11/2017  . [employer] (NIH) Stroke Scale limb ataxia score 2, ataxia present in two limbs 06/25/2017  . 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 Elevated vitamin B12 level 05/30/2017  . Vitamin D deficiency 05/29/2017  . History of tobacco abuse-  30pk yr hx - quit 04/07/17 05/21/2017  . Gait disturbance, post-stroke 05/14/2017  . Benign essential HTN   . Vit[place] of right [place] ([place]  . Adjustment disorder with mixed anxiety and depressed mood   . 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 Terson syndrome of both eyes ([facility]05/15/2018  . s/p SAH (subarachnoid hemorrhage) ([facility]/10/2017  . Basilar artery aneurysm ([facility] [place]  . Hypoxia   . Subarachnoid hemorrhage due to ruptured aneurysm ([facility]/2018  .  CVA (cerebral vascular accident) ([place]) 04/07/2017  . Elevated gastrin level 01/25/2012  . Hypokalemia 01/21/2012  . Nausea & vomiting 01/20/2012  . Epigastric pain 01/20/2012  . Duodenal ulcer, acute with obstruction 10/17/2011  . S/P laparoscopic cholecystectomy 10/16/2011    RINE,KATHRYN 01/01/2018, 9:50 AM  [facility] [address] [place], [place], [address] Phone: [phone]   Fax:  [phone]  Name: [name] Barnett MRN: [id number] Date of Birth: Oct 01, 1976

## 2018-01-01 NOTE — Therapy (Signed)
[facility] [address] [place], [place], [address] Phone: [phone]   Fax:  [phone]  Physical Therapy Treatment  Patient Details  Name: [name] Barnett MRN: [id number] Date of Birth: [birth year] Referring Provider: Charlett Blake, MD   Encounter Date: 12/31/2017   12/31/17 1453  PT Visits / Re-Eval  Visit Number 43  Number of Visits 46  Date for PT Re-Evaluation 01/13/18  Authorization  Authorization Type BCBS  PT Time Calculation  PT Start Time 0254  PT Stop Time 1530  PT Time Calculation (min) 41 min  PT - End of Session  Equipment Utilized During Treatment Gait belt  Activity Tolerance Patient tolerated treatment well  Behavior During Therapy [facility] for tasks assessed/performed     Past Medical History:  Diagnosis Date  . Duodenal obstruction   . GERD (gastroesophageal reflux disease)   . Hypertension   . Stroke [facility])    april 2018    Past Surgical History:  Procedure Laterality Date  . ABDOMINAL HYSTERECTOMY    . BALLOON DILATION  12/31/2011   Procedure: BALLOON DILATION;  Surgeon: Missy Sabins, MD;  Location: [facility] ENDOSCOPY;  Service: Endoscopy;  Laterality: N/A;  . CHOLECYSTECTOMY  07/28/11  . ESOPHAGOGASTRODUODENOSCOPY  10/17/2011   Procedure: ESOPHAGOGASTRODUODENOSCOPY (EGD);  Surgeon: Missy Sabins, MD;  Location: [facility] ENDOSCOPY;  Service: Endoscopy;  Laterality: N/A;  . IR ANGIO INTRA EXTRACRAN SEL INTERNAL CAROTID BILAT MOD SED  04/07/2017  . IR ANGIO VERTEBRAL SEL VERTEBRAL UNI R MOD SED  04/07/2017  . IR ANGIOGRAM FOLLOW UP STUDY  04/07/2017  . IR ANGIOGRAM FOLLOW UP STUDY  04/07/2017  . IR ANGIOGRAM FOLLOW UP STUDY  04/07/2017  . IR ANGIOGRAM FOLLOW UP STUDY  04/07/2017  . IR ANGIOGRAM FOLLOW UP STUDY  04/07/2017  . IR ANGIOGRAM SELECTIVE EACH ADDITIONAL VESSEL  04/07/2017  . IR TRANSCATH/EMBOLIZ  04/07/2017  . PARS PLANA VITRECTOMY Right 05/01/2017   Procedure: PARS PLANA VITRECTOMY WITH 25  GAUGE RIGHT EYE, endolaser photocoaglation;  Surgeon: Jalene Mullet, MD;  Location: [place];  Service: Ophthalmology;  Laterality: Right;  . RADIOLOGY WITH ANESTHESIA N/A 04/07/2017   Procedure: RADIOLOGY WITH ANESTHESIA;  Surgeon: Consuella Lose, MD;  Location: [place];  Service: Radiology;  Laterality: N/A;  . REPLACEMENT TOTAL KNEE BILATERAL  2004  . TEMPOROMANDIBULAR JOINT SURGERY     2 surgeries  . TUMOR REMOVAL    . VAGOTOMY  01/23/2012   Procedure: VAGOTOMY, antrectomy and BII;  Surgeon: Haywood Lasso, MD;  Location: [place];  Service: General;  Laterality: N/A;  Laparotomy with vagotomy.    There were no vitals filed for this visit.    12/31/17 1451  Symptoms/Limitations  Subjective No new complaints. Reports the right LE pain is getting better. No falls.   Patient is accompained by: Family member  Limitations Standing;Walking  Pain Assessment  Currently in Pain? Yes  Pain Score 7  Pain Location Leg  Pain Orientation Right  Pain Descriptors / Indicators Burning  Pain Type Neuropathic pain  Pain Radiating Towards from thigh down to just above the ankle  Pain Onset More than a month ago  Pain Frequency Constant  Aggravating Factors  unknown  Pain Relieving Factors changing positions      12/31/17 1454  Transfers  Transfers Sit to Stand;Stand to Sit  Sit to Stand 4: Min guard;With upper extremity assist;From bed;From chair/3-in-1  Sit to Stand Details Verbal cues for sequencing;Verbal cues for gait pattern;Verbal cues for  safe use of DME/AE  Stand to Sit 4: Min guard;With upper extremity assist;To bed;To chair/3-in-1  Stand to Sit Details (indicate cue type and reason) Verbal cues for precautions/safety;Verbal cues for safe use of DME/AE  Ambulation/Gait  Ambulation/Gait Yes  Ambulation/Gait Assistance 4: Min assist  Ambulation/Gait Assistance Details cues for posture, step length and weight shifting. cues to slow down for more controlled movements. improved balance  when pt slowed down with gait.             Ambulation Distance (Feet) 100 Feet (x1 no AD, 50 x2 with cane)  Assistive device Straight cane;None (cane with rubber quad tip)  Gait Pattern Step-through pattern;Decreased arm swing - left;Decreased weight shift to left  Ambulation Surface Level;Indoor  High Level Balance  High Level Balance Activities Side stepping;Backward walking;Tandem walking (tandem fwd; resisted gait fwd/bwd)  High Level Balance Comments in parallel bars for safety: 4-6 laps each with cues on posture, ex form, weight shifting, to slow down. all with min guard to min assist for balance, mirror feedback to assist with posture, step placement with activity. occasional touch to bars for balance recovery needed. with resisted gait pt needed cues on posture and step length to assist with balance.                 Neuro Re-ed   Neuro Re-ed Details  NMR for strengthening/mm re-education/coordination: seated on green air disc-pelvic rocking laterally, pelvic rocking ant/post- both with emphasis on tall posture/core stabilization; alt UE raises OH, alternating LAG's, alternating marching, then alternating combo contralateral UE/LE raises. cues on posture, core stabilization and weight shifting to assist with balance.                               PT Short Term Goals - 12/17/17 1440      PT SHORT TERM GOAL #1   Title  Pt will improve DGI by 4 points from initial assessment    Baseline  10/24, 11/24    Status  Not Met      PT SHORT TERM GOAL #2   Title  Pt will decrease falls risk with gait as indicated by increase in gait velocity with LRAD to > or = 2.0 ft/sec    Baseline  1.55 ft/sec when experiencing pain in RLE, 1.8 ft/sec RW    Status  Partially Met      PT SHORT TERM GOAL #3   Title  Pt will decrease falls risk as indicated by increase in BERG balance score to > or = 40/56    Baseline  34/56 when experiencing increased pain in RLE, 43/56 on 12/13/17    Status  Achieved       PT SHORT TERM GOAL #4   Title  Pt will ambulate >200' on indoor surfaces with cane (or no AD) and negotiate 4 stairs with one rail, alternating sequence with min A     Baseline  12/17/17: met stair portion of gaol only.    Status  Partially Met        PT Long Term Goals - 12/19/17 1556      PT LONG TERM GOAL #1   Title  Pt and husband will demonstrate independence with HEP    Time  8    Period  Weeks    Status  Revised    Target Date  01/13/18      PT LONG TERM GOAL #2  Title  Pt will demonstrate improved balance and decreased falls risk as indicated by BERG balance score of > or = 45/56    Baseline  43/56    Time  8    Period  Weeks    Status  Revised    Target Date  01/13/18      PT LONG TERM GOAL #3   Title  Pt will ambulate with cane (or no AD) x 250' over outdoor paved surfaces with min A; will negotiate 4 stairs with one rail with alternating sequence and supervision    Time  8    Period  Weeks    Status  Revised    Target Date  01/13/18      PT LONG TERM GOAL #4   Title  Pt will report 15% improvement in Neuro QOL-LE    Baseline  31.3%    Time  8    Period  Weeks    Status  Revised      PT LONG TERM GOAL #5   Title  Pt will decrease falls risk during gait in home/community as indicated by increase in gait velocity to > or = 2.0 ft/sec with LRAD    Baseline  1.8 ft/sec with RW    Time  8    Period  Weeks    Status  Revised    Target Date  01/13/18      PT LONG TERM GOAL #6   Title  Pt will demonstrate decreased falls risk with gait as indicated by increase in DGI score to >/= 15/24 with LRAD    Baseline  11/24    Time  8    Period  Weeks    Status  Revised    Target Date  01/13/18         12/31/17 1453  Plan  Clinical Impression Statement Today's skilled session continued to address gait without AD and high level balance activities. Pt continues to get anxious with new activities needing encouragement and cues on ex form/technique to assist with  balance. Pt is making steady progress toward goals and should benefit from continued PT to progress toward unmet goals.             Pt will benefit from skilled therapeutic intervention in order to improve on the following deficits Abnormal gait;Decreased balance;Decreased cognition;Decreased coordination;Decreased strength;Difficulty walking;Dizziness;Impaired sensation;Impaired vision/preception;Pain;Decreased activity tolerance;Decreased mobility  Rehab Potential Good  PT Frequency 2x / week  PT Duration 8 weeks  PT Treatment/Interventions ADLs/Self Care Home Management;Aquatic Therapy;DME Instruction;Gait training;Stair training;Functional mobility training;Therapeutic activities;Therapeutic exercise;Balance training;Neuromuscular re-education;Patient/family education;Vestibular;Visual/perceptual remediation/compensation;Cognitive remediation;Orthotic Fit/Training  PT Next Visit Plan have pt begin to walk to/from waiting area without AD, Did she dry run a shower? standing or tall kneeling - graded resistance during weight shifting various directions. Corner balance exercises with more narrow BOS/head turns.  treadmill: one LE on belt-swing phase for coordination/control; Physioball exercises for postural control.    Consulted and Agree with Plan of Care Patient;Family member/caregiver  Family Member Consulted husband       Patient will benefit from skilled therapeutic intervention in order to improve the following deficits and impairments:  Abnormal gait, Decreased balance, Decreased cognition, Decreased coordination, Decreased strength, Difficulty walking, Dizziness, Impaired sensation, Impaired vision/preception, Pain, Decreased activity tolerance, Decreased mobility  Visit Diagnosis: Other abnormalities of gait and mobility  Unsteadiness on feet  Ataxic gait     Problem List Patient Active Problem List   Diagnosis Date Noted  . Ataxia, post-stroke  10/15/2017  . Weakness with  dizziness-  since [facility] and CVA 04/07/17 08/07/2017  . Disturbances of vision, late effect of stroke 08/06/2017  . Health education/counseling 08/05/2017  . High risk medications (not anticoagulants) long-term use 08/05/2017  . Neuritis-  R sided:  arm and leg/ body due to stroke 08/05/2017  . Elevated LDL cholesterol level 07/11/2017  . [employer] (NIH) Stroke Scale limb ataxia score 2, ataxia present in two limbs 06/25/2017  . 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 Elevated vitamin B12 level 05/30/2017  . Vitamin D deficiency 05/29/2017  . History of tobacco abuse-  30pk yr hx - quit 04/07/17 05/21/2017  . Gait disturbance, post-stroke 05/14/2017  . Benign essential HTN   . Vitreous hemorrhage of right eye ([place])   . Adjustment disorder with mixed anxiety and depressed mood   . 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 s/p SAH (subarachnoid hemorrhage) ([place]) 04/19/2017  . 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 Hypoxia   . Subarachnoid hemorrhage due to ruptured aneurysm ([place]) 04/07/2017  . CVA (cerebral vascular accident) ([place]) 04/07/2017  . Elevated gastrin level 01/25/2012  . Hypokalemia 01/21/2012  . Nausea & vomiting 01/20/2012  . Epigastric pain 01/20/2012  . Duodenal ulcer, acute with obstruction 10/17/2011  . S/P laparoscopic cholecystectomy 10/16/2011    Willow Ora, PTA, [facility] [address], [place], [place] [address] [phone] 01/01/18, 11:27 PM   Name: [name] Barnett MRN: [id number] Date of Birth: 1975-12-15

## 2018-01-01 NOTE — Telephone Encounter (Signed)
We received an electronic request for lyrica refill.  Request asks for #60, BID. Dr. Wynn BankerKirsteins last clinic note indicates for patient to try 3 times a day and suggests 4 times a day if 3 times a day isn't working.  I contacted patient and she reports taking 3 times a day, sometimes 4 times a day.  Given the controlled substance status, do we have permission to increase to #120 to cover the possibility that sometimes she takes 4 capsules a day.  Also, patient reports that the rIght femoral nerve block did not work.

## 2018-01-01 NOTE — Telephone Encounter (Signed)
Call in Lyrica 100mg  #90, sig 1 po TID no RF

## 2018-01-01 NOTE — Therapy (Signed)
[facility] [address] [place], [place], [address] Phone: [phone]   Fax:  [phone]  Physical Therapy Treatment  Patient Details  Name: [name] Barnett MRN: [id number] Date of Birth: 12-31-1975 Referring Provider: Charlett Blake, MD   Encounter Date: 12/31/2017  PT End of Session - 12/31/17 1453    Visit Number  43    Number of Visits  46    Date for PT Re-Evaluation  01/13/18    Authorization Type  BCBS    PT Start Time  1449    PT Stop Time  1530    PT Time Calculation (min)  41 min    Equipment Utilized During Treatment  Gait belt    Activity Tolerance  Patient tolerated treatment well    Behavior During Therapy  [facility] for tasks assessed/performed       Past Medical History:  Diagnosis Date  . Duodenal obstruction   . GERD (gastroesophageal reflux disease)   . Hypertension   . Stroke [facility])    april 2018    Past Surgical History:  Procedure Laterality Date  . ABDOMINAL HYSTERECTOMY    . BALLOON DILATION  12/31/2011   Procedure: BALLOON DILATION;  Surgeon: Missy Sabins, MD;  Location: [facility] ENDOSCOPY;  Service: Endoscopy;  Laterality: N/A;  . CHOLECYSTECTOMY  07/28/11  . ESOPHAGOGASTRODUODENOSCOPY  10/17/2011   Procedure: ESOPHAGOGASTRODUODENOSCOPY (EGD);  Surgeon: Missy Sabins, MD;  Location: [facility] ENDOSCOPY;  Service: Endoscopy;  Laterality: N/A;  . IR ANGIO INTRA EXTRACRAN SEL INTERNAL CAROTID BILAT MOD SED  04/07/2017  . IR ANGIO VERTEBRAL SEL VERTEBRAL UNI R MOD SED  04/07/2017  . IR ANGIOGRAM FOLLOW UP STUDY  04/07/2017  . IR ANGIOGRAM FOLLOW UP STUDY  04/07/2017  . IR ANGIOGRAM FOLLOW UP STUDY  04/07/2017  . IR ANGIOGRAM FOLLOW UP STUDY  04/07/2017  . IR ANGIOGRAM FOLLOW UP STUDY  04/07/2017  . IR ANGIOGRAM SELECTIVE EACH ADDITIONAL VESSEL  04/07/2017  . IR TRANSCATH/EMBOLIZ  04/07/2017  . PARS PLANA VITRECTOMY Right 05/01/2017   Procedure: PARS PLANA VITRECTOMY WITH 25 GAUGE RIGHT EYE, endolaser  photocoaglation;  Surgeon: Jalene Mullet, MD;  Location: [place];  Service: Ophthalmology;  Laterality: Right;  . RADIOLOGY WITH ANESTHESIA N/A 04/07/2017   Procedure: RADIOLOGY WITH ANESTHESIA;  Surgeon: Consuella Lose, MD;  Location: [place];  Service: Radiology;  Laterality: N/A;  . REPLACEMENT TOTAL KNEE BILATERAL  2004  . TEMPOROMANDIBULAR JOINT SURGERY     2 surgeries  . TUMOR REMOVAL    . VAGOTOMY  01/23/2012   Procedure: VAGOTOMY, antrectomy and BII;  Surgeon: Haywood Lasso, MD;  Location: [place];  Service: General;  Laterality: N/A;  Laparotomy with vagotomy.    There were no vitals filed for this visit.  Subjective Assessment - 12/31/17 1451    Subjective  No new complaints. Reports the right LE pain is getting better. No falls.     Patient is accompained by:  Family member    Limitations  Standing;Walking    Currently in Pain?  Yes    Pain Score  7     Pain Location  Leg    Pain Orientation  Right    Pain Descriptors / Indicators  Burning    Pain Type  Neuropathic pain    Pain Radiating Towards  from thigh down to just above the ankle    Pain Onset  More than a month ago    Pain Frequency  Constant  Aggravating Factors   unknown    Pain Relieving Factors  changing positions                      [facility] Adult PT Treatment/Exercise - 12/31/17 1454      Transfers   Transfers  Sit to Stand;Stand to Sit    Sit to Stand  4: Min guard;With upper extremity assist;From bed;From chair/3-in-1    Stand to Sit  4: Min guard;With upper extremity assist;To bed;To chair/3-in-1      Ambulation/Gait   Ambulation/Gait  Yes    Ambulation/Gait Assistance  4: Min assist    Ambulation Distance (Feet)  100 Feet x1 no AD, 50 x2 with cane    Assistive device  Straight cane;None cane with rubber quad tip    Gait Pattern  Step-through pattern;Decreased arm swing - left;Decreased weight shift to left    Ambulation Surface  Level;Indoor      High Level Balance   High  Level Balance Activities  Side stepping;Backward walking;Tandem walking tandem fwd; resisted gait fwd/bwd    High Level Balance Comments  in parallel bars for safety: 4-6 laps each with cues on posture, ex form, weight shifting, to slow down. all with min guard to min assist for balance, mirror feedback to assist with posture, step placement with activity. occasional touch to bars for balance recovery needed.      Neuro Re-ed    Neuro Re-ed Details   NMR for strengthening/mm re-education/coordination: seated on green air disc-                                      PT Short Term Goals - 12/17/17 1440      PT SHORT TERM GOAL #1   Title  Pt will improve DGI by 4 points from initial assessment    Baseline  10/24, 11/24    Status  Not Met      PT SHORT TERM GOAL #2   Title  Pt will decrease falls risk with gait as indicated by increase in gait velocity with LRAD to > or = 2.0 ft/sec    Baseline  1.55 ft/sec when experiencing pain in RLE, 1.8 ft/sec RW    Status  Partially Met      PT SHORT TERM GOAL #3   Title  Pt will decrease falls risk as indicated by increase in BERG balance score to > or = 40/56    Baseline  34/56 when experiencing increased pain in RLE, 43/56 on 12/13/17    Status  Achieved      PT SHORT TERM GOAL #4   Title  Pt will ambulate >200' on indoor surfaces with cane (or no AD) and negotiate 4 stairs with one rail, alternating sequence with min A     Baseline  12/17/17: met stair portion of gaol only.    Status  Partially Met        PT Long Term Goals - 12/19/17 1556      PT LONG TERM GOAL #1   Title  Pt and husband will demonstrate independence with HEP    Time  8    Period  Weeks    Status  Revised    Target Date  01/13/18      PT LONG TERM GOAL #2   Title  Pt will demonstrate improved balance and decreased falls risk as indicated by  BERG balance score of > or = 45/56    Baseline  43/56    Time  8    Period  Weeks    Status  Revised    Target Date   01/13/18      PT LONG TERM GOAL #3   Title  Pt will ambulate with cane (or no AD) x 250' over outdoor paved surfaces with min A; will negotiate 4 stairs with one rail with alternating sequence and supervision    Time  8    Period  Weeks    Status  Revised    Target Date  01/13/18      PT LONG TERM GOAL #4   Title  Pt will report 15% improvement in Neuro QOL-LE    Baseline  31.3%    Time  8    Period  Weeks    Status  Revised      PT LONG TERM GOAL #5   Title  Pt will decrease falls risk during gait in home/community as indicated by increase in gait velocity to > or = 2.0 ft/sec with LRAD    Baseline  1.8 ft/sec with RW    Time  8    Period  Weeks    Status  Revised    Target Date  01/13/18      PT LONG TERM GOAL #6   Title  Pt will demonstrate decreased falls risk with gait as indicated by increase in DGI score to >/= 15/24 with LRAD    Baseline  11/24    Time  8    Period  Weeks    Status  Revised    Target Date  01/13/18            Plan - 12/31/17 1453    Clinical Impression Statement  Today's skilled session continued to address gait without AD and high level balance activities. Pt continues to get anxious with new activities needing encouragement and cues on ex form/technique to assist with balance. Pt is making steady progress toward goals and should benefit from continued PT to progress toward unmet goals.               Rehab Potential  Good    PT Frequency  2x / week    PT Duration  8 weeks    PT Treatment/Interventions  ADLs/Self Care Home Management;Aquatic Therapy;DME Instruction;Gait training;Stair training;Functional mobility training;Therapeutic activities;Therapeutic exercise;Balance training;Neuromuscular re-education;Patient/family education;Vestibular;Visual/perceptual remediation/compensation;Cognitive remediation;Orthotic Fit/Training    PT Next Visit Plan   have pt begin to walk to/from waiting area without AD, Did she dry run a shower? standing or  tall kneeling - graded resistance during weight shifting various directions. Corner balance exercises with more narrow BOS/head turns.  treadmill: one LE on belt-swing phase for coordination/control; Physioball exercises for postural control.      Consulted and Agree with Plan of Care  Patient;Family member/caregiver    Family Member Consulted  husband       Patient will benefit from skilled therapeutic intervention in order to improve the following deficits and impairments:  Abnormal gait, Decreased balance, Decreased cognition, Decreased coordination, Decreased strength, Difficulty walking, Dizziness, Impaired sensation, Impaired vision/preception, Pain, Decreased activity tolerance, Decreased mobility  Visit Diagnosis: Other abnormalities of gait and mobility  Unsteadiness on feet  Ataxic gait     Problem List Patient Active Problem List   Diagnosis Date Noted  . 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 Weakness with dizziness-  since [facility] and CVA 04/07/17 08/07/2017  .  Disturbances of vision, late effect of stroke 08/06/2017  . Health education/counseling 08/05/2017  . High risk medications (not anticoagulants) long-term use 08/05/2017  . Neuritis-  R sided:  arm and leg/ body due to stroke 08/05/2017  . Elevated LDL cholesterol level 07/11/2017  . [employer] (NIH) Stroke Scale limb ataxia score 2, ataxia present in two limbs 06/25/2017  . 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 Elevated vitamin B12 level 05/30/2017  . Vitamin D deficiency 05/29/2017  . History of tobacco abuse-  30pk yr hx - quit 04/07/17 05/21/2017  . Gait disturbance, post-stroke 05/14/2017  . Benign essential HTN   . Vitreous hemorrhage of right eye ([place])   . Adjustment disorder with mixed anxiety and depressed mood   . 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 s/p SAH (subarachnoid hemorrhage) ([place])  04/19/2017  . 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 Hypoxia   . Subarachnoid hemorrhage due to ruptured aneurysm ([place]) 04/07/2017  . CVA (cerebral vascular accident) ([place]) 04/07/2017  . Elevated gastrin level 01/25/2012  . Hypokalemia 01/21/2012  . Nausea & vomiting 01/20/2012  . Epigastric pain 01/20/2012  . Duodenal ulcer, acute with obstruction 10/17/2011  . S/P laparoscopic cholecystectomy 10/16/2011    Willow Ora 01/01/2018, 10:40 PM  [facility] [address] [place], [place], [address] Phone: [phone]   Fax:  [phone]  Name: AYLANI SPURLOCK MRN: [id number] Date of Birth: [birth year]

## 2018-01-02 ENCOUNTER — Encounter: Payer: Self-pay | Admitting: Physical Therapy

## 2018-01-02 ENCOUNTER — Ambulatory Visit: Payer: BLUE CROSS/BLUE SHIELD | Admitting: Occupational Therapy

## 2018-01-02 ENCOUNTER — Encounter: Payer: Self-pay | Admitting: Occupational Therapy

## 2018-01-02 ENCOUNTER — Ambulatory Visit: Payer: BLUE CROSS/BLUE SHIELD | Admitting: Physical Therapy

## 2018-01-02 DIAGNOSIS — R2689 Other abnormalities of gait and mobility: Secondary | ICD-10-CM

## 2018-01-02 DIAGNOSIS — R270 Ataxia, unspecified: Secondary | ICD-10-CM

## 2018-01-02 DIAGNOSIS — R260 Ataxic gait: Secondary | ICD-10-CM | POA: Diagnosis not present

## 2018-01-02 DIAGNOSIS — R278 Other lack of coordination: Secondary | ICD-10-CM

## 2018-01-02 DIAGNOSIS — R41840 Attention and concentration deficit: Secondary | ICD-10-CM

## 2018-01-02 DIAGNOSIS — R41842 Visuospatial deficit: Secondary | ICD-10-CM

## 2018-01-02 DIAGNOSIS — R2681 Unsteadiness on feet: Secondary | ICD-10-CM

## 2018-01-02 DIAGNOSIS — R414 Neurologic neglect syndrome: Secondary | ICD-10-CM

## 2018-01-02 DIAGNOSIS — R208 Other disturbances of skin sensation: Secondary | ICD-10-CM

## 2018-01-02 DIAGNOSIS — I69054 Hemiplegia and hemiparesis following nontraumatic subarachnoid hemorrhage affecting left non-dominant side: Secondary | ICD-10-CM

## 2018-01-02 DIAGNOSIS — I69018 Other symptoms and signs involving cognitive functions following nontraumatic subarachnoid hemorrhage: Secondary | ICD-10-CM

## 2018-01-02 NOTE — Therapy (Signed)
[facility] [name] [place], [place], [address] Phone: [phone]   Fax:  [phone]  Occupational Therapy Treatment  Patient Details  Name: [name] Barnett MRN: [id number] Date of Birth: 1976-05-10 Referring Provider: Charlett Blake, MD   Encounter Date: 01/02/2018  OT End of Session - 01/02/18 1501    Visit Number  39    Number of Visits  24    Date for OT Re-Evaluation  01/13/18    Authorization Type  BCBS, no visit limit/auth req.    Authorization Time Period  renewal completed 11/14/17 for 8 wks    OT Start Time  1449    OT Stop Time  1530    OT Time Calculation (min)  41 min    Activity Tolerance  Patient tolerated treatment well    Behavior During Therapy  [facility] for tasks assessed/performed       Past Medical History:  Diagnosis Date  . Duodenal obstruction   . GERD (gastroesophageal reflux disease)   . Hypertension   . Stroke [facility])    april 2018    Past Surgical History:  Procedure Laterality Date  . ABDOMINAL HYSTERECTOMY    . BALLOON DILATION  12/31/2011   Procedure: BALLOON DILATION;  Surgeon: Missy Sabins, MD;  Location: [facility] ENDOSCOPY;  Service: Endoscopy;  Laterality: N/A;  . CHOLECYSTECTOMY  07/28/11  . ESOPHAGOGASTRODUODENOSCOPY  10/17/2011   Procedure: ESOPHAGOGASTRODUODENOSCOPY (EGD);  Surgeon: Missy Sabins, MD;  Location[facility] ENDOSCOPY;  Service: Endoscopy;  Laterality: N/A;  . IR ANGIO INTRA EXTRACRAN SEL INTERNAL CAROTID BILAT MOD SED  04/07/2017  . IR ANGIO VERTEBRAL SEL VERTEBRAL UNI R MOD SED  04/07/2017  . IR ANGIOGRAM FOLLOW UP STUDY  04/07/2017  . IR ANGIOGRAM FOLLOW UP STUDY  04/07/2017  . IR ANGIOGRAM FOLLOW UP STUDY  04/07/2017  . IR ANGIOGRAM FOLLOW UP STUDY  04/07/2017  . IR ANGIOGRAM FOLLOW UP STUDY  04/07/2017  . IR ANGIOGRAM SELECTIVE EACH ADDITIONAL VESSEL  04/07/2017  . IR TRANSCATH/EMBOLIZ  04/07/2017  . PARS PLANA VITRECTOMY Right 05/01/2017   Procedure: PARS PLANA  VITRECTOMY WITH 25 GAUGE RIGHT EYE, endolaser photocoaglation;  Surgeon: Jalene Mullet, MD;  Location: [place];  Service: Ophthalmology;  Laterality: Right;  . RADIOLOGY WITH ANESTHESIA N/A 04/07/2017   Procedure: RADIOLOGY WITH ANESTHESIA;  Surgeon: Consuella Lose, MD;  Location: [place];  Service: Radiology;  Laterality: N/A;  . REPLACEMENT TOTAL KNEE BILATERAL  2004  . TEMPOROMANDIBULAR JOINT SURGERY     2 surgeries  . TUMOR REMOVAL    . VAGOTOMY  01/23/2012   Procedure: VAGOTOMY, antrectomy and BII;  Surgeon: Haywood Lasso, MD;  Location: [place];  Service: General;  Laterality: N/A;  Laparotomy with vagotomy.    There were no vitals filed for this visit.  Subjective Assessment - 01/02/18 1453    Subjective   Leg feels better today.      Pertinent History  Subarachnoid hemorrhage due to ruptured aneurysm, ataxia (L cerebellar); HTN, R side decr sensation, vertical diplopia/visual deficits, ataxia, Bilateral retinal hemorhage associated with SAH (with surgery on R eye in hospital and L approx 1 month ago)    Patient Stated Goals  be able to walk and use L arm    Currently in Pain?  Yes    Pain Score  2     Pain Location  Leg    Pain Orientation  Right    Pain Descriptors / Indicators  Aching  Pain Type  Neuropathic pain    Pain Onset  More than a month ago    Pain Frequency  Constant    Aggravating Factors   unknown     Pain Relieving Factors  repositioning       Discussed progress and barriers/limitations (hx of RLE pain, poor frustration tolerance at times, impulsivity, and willingness to try activities consistently at home for LUE).  Recommended neuropsych services.  Also, long discussion regarding continuing vs. Hold/taking break.  Discussed pros/cons of each.  Emphasized importance of incr LUE functional use and continuing with HEP post OT d/c.  Also recommended schedule for home recommendations and making an "I can't jar" to focus on the positive (place coin in whenever  she says she can't without attempting).   Pt unsure what she wants to do at this time and wants to see if pain continues to be improved with medication (Lyrica) increase.    In standing, functional reaching to grasp/release cylinder objects (floor>overhead) with LUE incorporating wt. Shift to the L and controlled squat as well as LUE control.  Min v.c. And supervision provided.  Sit>stand, stand>sit, ambulating with RW with min v.c. For safety/control.    Drinking with LUE with min prompts/encouragement.                     OT Education - 01/02/18 1731    Education Details  Neuropsych info/recommendation (goes to MD next week) to help with coping/adjustment    Person(s) Educated  Patient    Methods  Explanation;Handout    Comprehension  Verbalized understanding       OT Short Term Goals - 12/24/17 1626      OT SHORT TERM GOAL #2   Title  --      OT SHORT TERM GOAL #3   Title  --      OT SHORT TERM GOAL #4   Title  --      OT SHORT TERM GOAL #5   Title  --      OT SHORT TERM GOAL #6   Title  --      OT SHORT TERM GOAL #7   Title  Pt will complete functional task with LUE with no more than min cueing for impulsivity.--check STGs 12/14/16    Status  On-going inconsistent      OT SHORT TERM GOAL #8   Title  Pt will perform simple home maintenance task/snack prep in standing with set-up/supervision and no more than 1 v.c. for safety.    Status  Achieved      OT SHORT TERM GOAL  #9   TITLE  Pt will improve coordination for ADLs as shown by completing 9-hole peg test in less than 11mn 30sec.    Status  On-going 2 mins 43 secs        OT Long Term Goals - 12/24/17 1627      OT LONG TERM GOAL #1   Title  Pt/caregiver will be independent with updated HEP.--check LTGs 01/13/18    Time  8    Period  Weeks    Status  On-going 11/14/17:  needs further updates      OT LONG TERM GOAL #2   Title  Pt will be use LUE as nondominant assist at least 75% of the time for  ADLs without cueing.    Time  8    Period  Weeks    Status  Achieved uses 75% x however needs prompting  at times.  09/19/17:  met per pt  90%      OT LONG TERM GOAL #3   Title  Pt will perform toileting mod I (including transfer).    Time  8    Period  Weeks    Status  Achieved supervision.  09/19/17:  continues to need superivsion for mobility.  11/05/17  met      OT LONG TERM GOAL #4   Title  Pt will improve LUE coordination/control to improve score on box and blocks to at least 27 with LUE in 2 trials.--revised 11/14/17    Baseline  19 blocks    Time  8    Period  Weeks    Status  Revised 18, 19 blocks.  09/19/17:  25 blocks.  11/14/17:  19 blocks/inconsistent with frustration level      OT LONG TERM GOAL #5   Title  Pt will perform simple home maintenance task/snack prep in standing mod I.--updated 11/14/17    Time  8    Period  Weeks    Status  Revised Pt is not perfoming consistely, she has made a sandwich in seated.  11/14/17:  approximating, supervision/min A and cueing      OT LONG TERM GOAL #6   Title  Pt will perform simple environmental scanning/navigation with mod I.--revised 11/14/17    Time  8    Period  Weeks    Status  Revised 11/05/17  min cueing and supervision for mobility, environmental scanning with good accuracy      OT LONG TERM GOAL #7   Title  Pt will improve coordination for ADLs as shown by completing 9-hole peg test in less than 46mn.    Baseline  238m 5.44sec    Time  8    Period  Weeks    Status  New            Plan - 01/02/18 1502    Clinical Impression Statement  Pt progressing with improving LUE control with functional activity.      Rehab Potential  Good    Current Impairments/barriers affecting progress:  cognitive deficits, impulsivity    OT Frequency  2x / week    OT Duration  8 weeks    OT Treatment/Interventions  Self-care/ADL training;Therapeutic exercise;DME and/or AE instruction;Therapeutic activities;Cognitive  remediation/compensation;Visual/perceptual remediation/compensation;Passive range of motion;Functional Mobility Training;Neuromuscular education;Cryotherapy;Paraffin;Energy conservation;Manual Therapy;Patient/family education;Ultrasound;Balance training;Moist Heat;Fluidtherapy    Plan   discuss with pt plans to schedule more visits vs take a break/ LUE functional use/ Neuro re-ed core control to improve UE functional use; begin checking goals next week    OT Home Exercise Plan  Education provided:  HEP for LUE control, coordination, and functional use    Recommended Other Services  Neuropsychological referral due to poor frustration tolerance, adjustment/coping    Consulted and Agree with Plan of Care  Patient;Family member/caregiver    Family Member Consulted  mother       Patient will benefit from skilled therapeutic intervention in order to improve the following deficits and impairments:  Decreased coordination, Decreased range of motion, Difficulty walking, Abnormal gait, Decreased safety awareness, Impaired sensation, Decreased knowledge of precautions, Decreased balance, Decreased knowledge of use of DME, Impaired UE functional use, Decreased cognition, Decreased mobility, Decreased strength, Impaired vision/preception, Impaired perceived functional ability  Visit Diagnosis: Other lack of coordination  Unsteadiness on feet  Attention and concentration deficit  Visuospatial deficit  Other symptoms and signs involving cognitive functions following nontraumatic subarachnoid hemorrhage  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 Weakness with dizziness-  since [facility] and CVA 04/07/17 08/07/2017   . Disturbances of vision, late effect of stroke 08/06/2017  . Health education/counseling 08/05/2017  . High risk medications (not anticoagulants) long-term use 08/05/2017  . Neuritis-  R sided:  arm and leg/ body due to stroke 08/05/2017  . Elevated LDL cholesterol level 07/11/2017  . [employer] (NIH) Stroke Scale limb ataxia score 2, ataxia present in two limbs 06/25/2017  . 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 Elevated vitamin B12 level 05/30/2017  . Vitamin D deficiency 05/29/2017  . History of tobacco abuse-  30pk yr hx - quit 04/07/17 05/21/2017  . Gait disturbance, post-stroke 05/14/2017  . Benign essential HTN   . Vitreous hemorrhage of right eye ([place])   . Adjustment disorder with mixed anxiety and depressed mood   . 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 s/p SAH (subarachnoid hemorrhage) ([place]) 04/19/2017  . 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 Hypoxia   . Subarachnoid hemorrhage due to ruptured aneurysm ([place]) 04/07/2017  . CVA (cerebral vascular accident) ([place]) 04/07/2017  . Elevated gastrin level 01/25/2012  . Hypokalemia 01/21/2012  . Nausea & vomiting 01/20/2012  . Epigastric pain 01/20/2012  . Duodenal ulcer, acute with obstruction 10/17/2011  . S/P laparoscopic cholecystectomy 10/16/2011    [facility] 01/02/2018, 5:34 PM  [facility] [address] [place], [place], [address] Phone: [phone]   Fax:  [phone]  Name: [name] Barnett MRN: [id number] Date of Birth: [birth year]   Vianne Bulls, OTR/L [facility] [address] [place], [place]  [address] [phone] phone [phone] 01/02/18 5:34 PM

## 2018-01-02 NOTE — Therapy (Signed)
[facility] [address] [place], [place], [address] Phone: [phone]   Fax:  [phone]  Physical Therapy Treatment  Patient Details  Name: [name] Barnett MRN: [id number] Date of Birth: 13-Jan-1976 Referring Provider: Charlett Blake, MD   Encounter Date: 01/02/2018  [name] Barnett End of Session - 01/02/18 1635    Visit Number  44    Number of Visits  46    Date for [name] Barnett Re-Evaluation  01/13/18    Authorization Type  BCBS    [name] Barnett Start Time  1532    [name] Barnett Stop Time  1620    [name] Barnett Time Calculation (min)  48 min    Activity Tolerance  Patient tolerated treatment well    Behavior During Therapy  [facility] for tasks assessed/performed       Past Medical History:  Diagnosis Date  . Duodenal obstruction   . GERD (gastroesophageal reflux disease)   . Hypertension   . Stroke [facility])    april 2018    Past Surgical History:  Procedure Laterality Date  . ABDOMINAL HYSTERECTOMY    . BALLOON DILATION  12/31/2011   Procedure: BALLOON DILATION;  Surgeon: Missy Sabins, MD;  Location[facility] ENDOSCOPY;  Service: Endoscopy;  Laterality: N/A;  . CHOLECYSTECTOMY  07/28/11  . ESOPHAGOGASTRODUODENOSCOPY  10/17/2011   Procedure: ESOPHAGOGASTRODUODENOSCOPY (EGD);  Surgeon: Missy Sabins, MD;  Location: [facility] ENDOSCOPY;  Service: Endoscopy;  Laterality: N/A;  . IR ANGIO INTRA EXTRACRAN SEL INTERNAL CAROTID BILAT MOD SED  04/07/2017  . IR ANGIO VERTEBRAL SEL VERTEBRAL UNI R MOD SED  04/07/2017  . IR ANGIOGRAM FOLLOW UP STUDY  04/07/2017  . IR ANGIOGRAM FOLLOW UP STUDY  04/07/2017  . IR ANGIOGRAM FOLLOW UP STUDY  04/07/2017  . IR ANGIOGRAM FOLLOW UP STUDY  04/07/2017  . IR ANGIOGRAM FOLLOW UP STUDY  04/07/2017  . IR ANGIOGRAM SELECTIVE EACH ADDITIONAL VESSEL  04/07/2017  . IR TRANSCATH/EMBOLIZ  04/07/2017  . PARS PLANA VITRECTOMY Right 05/01/2017   Procedure: PARS PLANA VITRECTOMY WITH 25 GAUGE RIGHT EYE, endolaser photocoaglation;  Surgeon: Jalene Mullet, MD;  Location:  [place];  Service: Ophthalmology;  Laterality: Right;  . RADIOLOGY WITH ANESTHESIA N/A 04/07/2017   Procedure: RADIOLOGY WITH ANESTHESIA;  Surgeon: Consuella Lose, MD;  Location: [place];  Service: Radiology;  Laterality: N/A;  . REPLACEMENT TOTAL KNEE BILATERAL  2004  . TEMPOROMANDIBULAR JOINT SURGERY     2 surgeries  . TUMOR REMOVAL    . VAGOTOMY  01/23/2012   Procedure: VAGOTOMY, antrectomy and BII;  Surgeon: Haywood Lasso, MD;  Location: [place];  Service: General;  Laterality: N/A;  Laparotomy with vagotomy.    There were no vitals filed for this visit.  Subjective Assessment - 01/02/18 1541    Subjective  Physician increased Lyrica, helping with pain - now 1-2/10 but is causing her increased dizziness.  Did well at last visit.     Patient is accompained by:  Family member    Limitations  Standing;Walking    Currently in Pain?  Yes    Pain Score  1     Pain Location  Leg    Pain Orientation  Right    Pain Descriptors / Indicators  Shooting    Pain Type  Neuropathic pain    Pain Onset  More than a month ago                      [facility] Adult [name] Barnett Treatment/Exercise -  01/02/18 1557      Self-Care   Self-Care  Other Self-Care Comments    Other Self-Care Comments   discussion with [name] Barnett regarding plan after next week and whether to recertify for more visits or take a break from therapy due to LE pain.  [name] Barnett feels with Lyrica her pain has improved and she is able to tolerate greater balance and gait challenges; [name] Barnett would like to continue with therapy.  Advised [name] Barnett to monitor symptoms in RLE through the weekend and if by next session her symptoms remain tolerable (1-2/10) [name] Barnett will continue to work towards [name] Barnett's goals of ambulating without AD.  If symptoms flare up after significant challenge today, anticipate [name] Barnett will benefit from a break until symptoms are more tolerable.        Neuro Re-ed    Neuro Re-ed Details   NMR in tall kneeling with graded resistance for motor  control/grading of movement during ant/posterior and diagoal weight shifting.  Transitioned to tall kneeling lateral stepping to L and R, forwards and backwards x 3-4 reps each while maintaining pressure against therapist (hand to hand) for trunk control.  Continued graded resistance during weight shifting in various directions in standing feet apart, feet staggered R/L foot forwards and then progressed to maintaining resistance during gait first with [name] Barnett pushing through UE (hand to hand to therapist) and then changing to [name] Barnett with hands on therapist's shoulders pressing down for increased trunk control and proximal stability during ambulation             [name] Barnett Education - 01/02/18 1634    Education provided  Yes    Education Details  plan for next week    Person(s) Educated  Patient;Parent(s)    Methods  Explanation    Comprehension  Verbalized understanding       [name] Barnett Short Term Goals - 12/17/17 1440      [name] Barnett SHORT TERM GOAL #1   Title  [name] Barnett will improve DGI by 4 points from initial assessment    Baseline  10/24, 11/24    Status  Not Met      [name] Barnett SHORT TERM GOAL #2   Title  [name] Barnett will decrease falls risk with gait as indicated by increase in gait velocity with LRAD to > or = 2.0 ft/sec    Baseline  1.55 ft/sec when experiencing pain in RLE, 1.8 ft/sec RW    Status  Partially Met      [name] Barnett SHORT TERM GOAL #3   Title  [name] Barnett will decrease falls risk as indicated by increase in BERG balance score to > or = 40/56    Baseline  34/56 when experiencing increased pain in RLE, 43/56 on 12/13/17    Status  Achieved      [name] Barnett SHORT TERM GOAL #4   Title  [name] Barnett will ambulate >200' on indoor surfaces with cane (or no AD) and negotiate 4 stairs with one rail, alternating sequence with min A     Baseline  12/17/17: met stair portion of gaol only.    Status  Partially Met        [name] Barnett Long Term Goals - 12/19/17 1556      [name] Barnett LONG TERM GOAL #1   Title  [name] Barnett and husband will demonstrate independence with HEP    Time  8     Period  Weeks    Status  Revised    Target Date  01/13/18      [name] Barnett LONG TERM GOAL #2  Title  [name] Barnett will demonstrate improved balance and decreased falls risk as indicated by BERG balance score of > or = 45/56    Baseline  43/56    Time  8    Period  Weeks    Status  Revised    Target Date  01/13/18      [name] Barnett LONG TERM GOAL #3   Title  [name] Barnett will ambulate with cane (or no AD) x 250' over outdoor paved surfaces with min A; will negotiate 4 stairs with one rail with alternating sequence and supervision    Time  8    Period  Weeks    Status  Revised    Target Date  01/13/18      [name] Barnett LONG TERM GOAL #4   Title  [name] Barnett will report 15% improvement in Neuro QOL-LE    Baseline  31.3%    Time  8    Period  Weeks    Status  Revised      [name] Barnett LONG TERM GOAL #5   Title  [name] Barnett will decrease falls risk during gait in home/community as indicated by increase in gait velocity to > or = 2.0 ft/sec with LRAD    Baseline  1.8 ft/sec with RW    Time  8    Period  Weeks    Status  Revised    Target Date  01/13/18      [name] Barnett LONG TERM GOAL #6   Title  [name] Barnett will demonstrate decreased falls risk with gait as indicated by increase in DGI score to >/= 15/24 with LRAD    Baseline  11/24    Time  8    Period  Weeks    Status  Revised    Target Date  01/13/18            Plan - 01/02/18 1636    Clinical Impression Statement  Due to decreased pain in RLE [name] Barnett able to tolerate return to NMR in tall kneeling, standing and gait without RW for increased trunk control/postural control and grading of movement training during weight shifting and LE advancement.  No increase in pain at end of session but did report LE fatigue.  Will begin to assess LTG and [name] Barnett tolerance to increased challenges next week and will make final decision about recertification vs. taking a break from therapy if pain increases with increased challenges.    Rehab Potential  Good    [name] Barnett Frequency  2x / week    [name] Barnett Duration  8 weeks    [name] Barnett  Treatment/Interventions  ADLs/Self Care Home Management;Aquatic Therapy;DME Instruction;Gait training;Stair training;Functional mobility training;Therapeutic activities;Therapeutic exercise;Balance training;Neuromuscular re-education;Patient/family education;Vestibular;Visual/perceptual remediation/compensation;Cognitive remediation;Orthotic Fit/Training    [name] Barnett Next Visit Plan  How did her pain do through the weekend? Need to schedule more visits if we recertify (2x/week x 8 with AP/KB/EP)  LTG due end of next week.  Leave RW with family; walk to/from waiting area without AD.  Continue graded resistance training during gait - sports cord or therapist applied; corner balance with narrow BOS/head turns -finding midline    Consulted and Agree with Plan of Care  Patient;Family member/caregiver    Family Member Consulted  mom       Patient will benefit from skilled therapeutic intervention in order to improve the following deficits and impairments:  Abnormal gait, Decreased balance, Decreased cognition, Decreased coordination, Decreased strength, Difficulty walking, Dizziness, Impaired sensation, Impaired vision/preception, Pain, Decreased activity tolerance, Decreased mobility  Visit Diagnosis: Ataxic gait  Other abnormalities of gait and mobility  Unsteadiness on feet  Other lack of coordination     Problem List Patient Active Problem List   Diagnosis Date Noted  . 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 Weakness with dizziness-  since [facility] and CVA 04/07/17 08/07/2017  . Disturbances of vision, late effect of stroke 08/06/2017  . Health education/counseling 08/05/2017  . High risk medications (not anticoagulants) long-term use 08/05/2017  . Neuritis-  R sided:  arm and leg/ body due to stroke 08/05/2017  . Elevated LDL cholesterol level 07/11/2017  . [employer] (NIH) Stroke Scale limb ataxia score 2, ataxia present in two limbs 06/25/2017  . Alteration of sensation as late  effect of stroke 06/25/2017  . Elevated vitamin B12 level 05/30/2017  . Vitamin D deficiency 05/29/2017  . History of tobacco abuse-  30pk yr hx - quit 04/07/17 05/21/2017  . Gait disturbance, post-stroke 05/14/2017  . Benign essential HTN   . Vitreous hemorrhage of right eye ([place])   . Adjustment disorder with mixed anxiety and depressed mood   . 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 s/p SAH (subarachnoid hemorrhage) ([place]) 04/19/2017  . 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 Hypoxia   . Subarachnoid hemorrhage due to ruptured aneurysm ([place]) 04/07/2017  . CVA (cerebral vascular accident) ([place]) 04/07/2017  . Elevated gastrin level 01/25/2012  . Hypokalemia 01/21/2012  . Nausea & vomiting 01/20/2012  . Epigastric pain 01/20/2012  . Duodenal ulcer, acute with obstruction 10/17/2011  . S/P laparoscopic cholecystectomy 10/16/2011    [name] Barnett, [name] Barnett, [name] Barnett 01/02/18    4:43 PM    [facility] [address] [place], [place], [address] Phone: [phone]   Fax:  [phone]  Name: [name] Barnett MRN: [id number] Date of Birth: 03-10-1976

## 2018-01-06 ENCOUNTER — Other Ambulatory Visit: Payer: Self-pay

## 2018-01-06 DIAGNOSIS — F4323 Adjustment disorder with mixed anxiety and depressed mood: Secondary | ICD-10-CM

## 2018-01-06 MED ORDER — FLUOXETINE HCL 20 MG PO CAPS: 40.0000 mg | ORAL_CAPSULE | Freq: Every day | ORAL | 1 refills | Status: DC

## 2018-01-06 NOTE — Telephone Encounter (Signed)
Pharmacy sent refill request for Fluoxetine.  Reviewed chart and sent refill to the pharmacy. MPulliam, CMA/RT(R)

## 2018-01-07 ENCOUNTER — Encounter (HOSPITAL_COMMUNITY): Payer: Self-pay | Admitting: Neurosurgery

## 2018-01-07 ENCOUNTER — Ambulatory Visit: Payer: BLUE CROSS/BLUE SHIELD | Admitting: Physical Therapy

## 2018-01-07 ENCOUNTER — Ambulatory Visit: Payer: BLUE CROSS/BLUE SHIELD | Admitting: Occupational Therapy

## 2018-01-07 ENCOUNTER — Other Ambulatory Visit: Payer: Self-pay | Admitting: Neurosurgery

## 2018-01-07 ENCOUNTER — Ambulatory Visit (HOSPITAL_COMMUNITY)
Admission: RE | Admit: 2018-01-07 | Discharge: 2018-01-07 | Disposition: A | Discharge status: Home / Self Care | Payer: BLUE CROSS/BLUE SHIELD | Source: Ambulatory Visit | Attending: Neurosurgery | Admitting: Neurosurgery

## 2018-01-07 DIAGNOSIS — I10 Essential (primary) hypertension: Secondary | ICD-10-CM | POA: Insufficient documentation

## 2018-01-07 DIAGNOSIS — Z87891 Personal history of nicotine dependence: Secondary | ICD-10-CM | POA: Diagnosis not present

## 2018-01-07 DIAGNOSIS — R1031 Right lower quadrant pain: Secondary | ICD-10-CM | POA: Diagnosis not present

## 2018-01-07 DIAGNOSIS — K219 Gastro-esophageal reflux disease without esophagitis: Secondary | ICD-10-CM | POA: Diagnosis not present

## 2018-01-07 DIAGNOSIS — Z8673 Personal history of transient ischemic attack (TIA), and cerebral infarction without residual deficits: Secondary | ICD-10-CM | POA: Diagnosis not present

## 2018-01-07 DIAGNOSIS — I604 Nontraumatic subarachnoid hemorrhage from basilar artery: Secondary | ICD-10-CM

## 2018-01-07 DIAGNOSIS — Z48812 Encounter for surgical aftercare following surgery on the circulatory system: Secondary | ICD-10-CM | POA: Insufficient documentation

## 2018-01-07 HISTORY — PX: IR ANGIO INTRA EXTRACRAN SEL INTERNAL CAROTID BILAT MOD SED: IMG5363

## 2018-01-07 HISTORY — PX: IR ANGIO VERTEBRAL SEL VERTEBRAL UNI R MOD SED: IMG5368

## 2018-01-07 HISTORY — PX: IR US GUIDE VASC ACCESS RIGHT: IMG2390

## 2018-01-07 LAB — PROTIME-INR
INR: 0.93
PROTHROMBIN TIME: 12.4 s (ref 11.4–15.2)

## 2018-01-07 LAB — CBC WITH DIFFERENTIAL/PLATELET
Basophils Absolute: 0 10*3/uL (ref 0.0–0.1)
Basophils Relative: 0 %
Eosinophils Absolute: 0.1 10*3/uL (ref 0.0–0.7)
Eosinophils Relative: 1 %
HCT: 44.6 % (ref 36.0–46.0)
HEMOGLOBIN: 14.4 g/dL (ref 12.0–15.0)
LYMPHS ABS: 3.1 10*3/uL (ref 0.7–4.0)
LYMPHS PCT: 31 %
MCH: 32.3 pg (ref 26.0–34.0)
MCHC: 32.3 g/dL (ref 30.0–36.0)
MCV: 100 fL (ref 78.0–100.0)
Monocytes Absolute: 0.6 10*3/uL (ref 0.1–1.0)
Monocytes Relative: 6 %
NEUTROS PCT: 62 %
Neutro Abs: 6.1 10*3/uL (ref 1.7–7.7)
Platelets: 305 10*3/uL (ref 150–400)
RBC: 4.46 MIL/uL (ref 3.87–5.11)
RDW: 13.4 % (ref 11.5–15.5)
WBC: 9.9 10*3/uL (ref 4.0–10.5)

## 2018-01-07 LAB — APTT: aPTT: 33 seconds (ref 24–36)

## 2018-01-07 LAB — BASIC METABOLIC PANEL
ANION GAP: 11 (ref 5–15)
BUN: 15 mg/dL (ref 6–20)
CHLORIDE: 110 mmol/L (ref 101–111)
CO2: 19 mmol/L — ABNORMAL LOW (ref 22–32)
Calcium: 8.9 mg/dL (ref 8.9–10.3)
Creatinine, Ser: 0.74 mg/dL (ref 0.44–1.00)
GFR calc non Af Amer: >60 mL/min (ref >60)
Glucose, Bld: 83 mg/dL (ref 65–99)
POTASSIUM: 4.4 mmol/L (ref 3.5–5.1)
SODIUM: 140 mmol/L (ref 135–145)

## 2018-01-07 MED ORDER — FENTANYL CITRATE (PF) 100 MCG/2ML IJ SOLN
INTRAMUSCULAR | Status: AC | PRN
  Administered 2018-01-07: 25 ug via INTRAVENOUS

## 2018-01-07 MED ORDER — FENTANYL CITRATE (PF) 100 MCG/2ML IJ SOLN
INTRAMUSCULAR | Status: AC
  Filled 2018-01-07: qty 2

## 2018-01-07 MED ORDER — HEPARIN SODIUM (PORCINE) 1000 UNIT/ML IJ SOLN
INTRAMUSCULAR | Status: AC | PRN
  Administered 2018-01-07: 2000 [IU] via INTRAVENOUS

## 2018-01-07 MED ORDER — LIDOCAINE HCL (PF) 1 % IJ SOLN
INTRAMUSCULAR | Status: AC | PRN
  Administered 2018-01-07: 5 mL

## 2018-01-07 MED ORDER — LIDOCAINE HCL 1 % IJ SOLN
INTRAMUSCULAR | Status: AC
  Filled 2018-01-07: qty 20

## 2018-01-07 MED ORDER — MIDAZOLAM HCL 2 MG/2ML IJ SOLN
INTRAMUSCULAR | Status: AC
  Filled 2018-01-07: qty 2

## 2018-01-07 MED ORDER — IOPAMIDOL (ISOVUE-300) INJECTION 61%
INTRAVENOUS | Status: AC
  Administered 2018-01-07: 40 mL
  Filled 2018-01-07: qty 150

## 2018-01-07 MED ORDER — HEPARIN SODIUM (PORCINE) 1000 UNIT/ML IJ SOLN
INTRAMUSCULAR | Status: AC
  Filled 2018-01-07: qty 1

## 2018-01-07 MED ORDER — MIDAZOLAM HCL 2 MG/2ML IJ SOLN
INTRAMUSCULAR | Status: AC | PRN
  Administered 2018-01-07: 1 mg via INTRAVENOUS

## 2018-01-07 MED ORDER — HYDROCODONE-ACETAMINOPHEN 5-325 MG PO TABS: 1.0000 | ORAL_TABLET | ORAL | Status: DC | PRN

## 2018-01-07 MED ORDER — CEFAZOLIN SODIUM-DEXTROSE 2-4 GM/100ML-% IV SOLN: 2.0000 g | INTRAVENOUS | Status: DC

## 2018-01-07 MED ORDER — SODIUM CHLORIDE 0.9 % IV SOLN: INTRAVENOUS | Status: DC

## 2018-01-07 NOTE — Sedation Documentation (Signed)
Patient is resting comfortably. 

## 2018-01-07 NOTE — H&P (Signed)
Chief Complaint   No chief complaint on file.   History of Present Illness  [name] Barnett is a 42 year old woman I am seeing in follow-up. She suffered a subarachnoid hemorrhage back in April of 2018. She underwent coiling of a basilar aneurysm. 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 She has made a reasonable recovery. She continues to have a complaint of dizziness, as well as diplopia. Over the last 4 months, she has been c/o severe right groin pain running down the lateral aspect of her right thigh. She thinks it is in relation to the coiling procedure during her hospitalization, although pain started several months later. She does not report any right foot weakness.   Past Medical History   Past Medical History:  Diagnosis Date  . Duodenal obstruction   . GERD (gastroesophageal reflux disease)   . Hypertension   . Stroke [facility]([name])    april 2018    Past Surgical History   Past Surgical History:  Procedure Laterality Date  . ABDOMINAL HYSTERECTOMY    . BALLOON DILATION  12/31/2011   Procedure: BALLOON DILATION;  Surgeon: Barrie FolkJohn C Hayes, MD;  [facility];  Service: Endoscopy;  Laterality: N/A;  . CHOLECYSTECTOMY  07/28/11  . ESOPHAGOGASTRODUODENOSCOPY  10/17/2011   Procedure: ESOPHAGOGASTRODUODENOSCOPY (EGD);  Surgeon: Barrie FolkJohn C Hayes, MD;  [facility];  Service: Endoscopy;  Laterality: N/A;  . IR ANGIO INTRA EXTRACRAN SEL INTERNAL CAROTID BILAT MOD SED  04/07/2017  . IR ANGIO VERTEBRAL SEL VERTEBRAL UNI R MOD SED  04/07/2017  . IR ANGIOGRAM FOLLOW UP STUDY  04/07/2017  . IR ANGIOGRAM FOLLOW UP STUDY  04/07/2017  . IR ANGIOGRAM FOLLOW UP STUDY  04/07/2017  . IR ANGIOGRAM FOLLOW UP STUDY  04/07/2017  . IR ANGIOGRAM FOLLOW UP STUDY  04/07/2017  . IR ANGIOGRAM SELECTIVE EACH ADDITIONAL VESSEL  04/07/2017  . IR TRANSCATH/EMBOLIZ  04/07/2017  . PARS PLANA VITRECTOMY Right 05/01/2017   Procedure: PARS PLANA VITRECTOMY WITH 25  GAUGE RIGHT EYE, endolaser photocoaglation;  Surgeon: Carmela RimaPatel, Narendra, MD;  [facility];  Service: Ophthalmology;  Laterality: Right;  . RADIOLOGY WITH ANESTHESIA N/A 04/07/2017   Procedure: RADIOLOGY WITH ANESTHESIA;  Surgeon: Lisbeth RenshawNeelesh Blayden Conwell, MD;  [facility];  Service: Radiology;  Laterality: N/A;  . REPLACEMENT TOTAL KNEE BILATERAL  2004  . TEMPOROMANDIBULAR JOINT SURGERY     2 surgeries  . TUMOR REMOVAL    . VAGOTOMY  01/23/2012   Procedure: VAGOTOMY, antrectomy and BII;  Surgeon: [name], MD;  [facility]: [facility] OR;  Service: General;  Laterality: N/A;  Laparotomy with vagotomy.    Social History   Social History   Tobacco Use  . Smoking status: Former Smoker    Packs/day: 1.00    Years: 20.00    Pack years: 20.00    Types: Cigarettes    Last attempt to quit: 04/07/2017    Years since quitting: 0.7  . Smokeless tobacco: Never Used  Substance Use Topics  . Alcohol use: No  . Drug use: No    Medications   Prior to Admission medications   Medication Sig Start Date End Date Taking? Authorizing Provider  amLODipine (NORVASC) 2.5 MG tablet Take 2.5 mg by mouth daily. 01/01/18  Yes [provider]  atorvastatin (LIPITOR) 20 MG tablet Take 1 tablet (20 mg total) by mouth at bedtime. 07/11/17  Yes Opalski, Gavin Poundeborah, DO  Coconut Oil 1000 MG CAPS Take 1,000 mg by mouth daily.   Yes  [provider]  FLUoxetine (PROZAC) 20 MG capsule Take 2 capsules (40 mg total) by mouth daily. Patient taking differently: Take 20 mg by mouth 2 (two) times daily.  01/06/18  Yes Opalski, Gavin Pound, DO  gabapentin (NEURONTIN) 600 MG tablet Take 1 tablet (600 mg total) by mouth 4 (four) times daily. Patient taking differently: Take 600 mg by mouth 4 (four) times daily as needed (nerve pain).  09/05/17  Yes Kirsteins, Victorino Sparrow, MD  Multiple Vitamin (MULTIVITAMIN WITH MINERALS) TABS tablet Take 1 tablet by mouth daily.   Yes [provider]  niacin 500 MG tablet Take 500 mg  by mouth at bedtime.   Yes [provider]  pregabalin (LYRICA) 75 MG capsule Take 1 capsule (75 mg total) by mouth 3 (three) times daily. 01/01/18  Yes Kirsteins, Victorino Sparrow, MD  topiramate (TOPAMAX) 50 MG tablet Take 1 tablet (50 mg total) by mouth 2 (two) times daily. 01/01/18  Yes Kirsteins, Victorino Sparrow, MD  valsartan (DIOVAN) 40 MG tablet Take 0.5 tablets (20 mg total) by mouth daily. 08/01/17  Yes Opalski, Gavin Pound, DO  Cholecalciferol (VITAMIN D3) 5000 units TABS 5,000 IU OTC vitamin D3 daily. Patient not taking: Reported on 01/07/2018 07/11/17   Thomasene Lot, DO  Vitamin D, Ergocalciferol, (DRISDOL) 50000 units CAPS capsule Take 1 capsule (50,000 Units total) by mouth every 7 (seven) days. Patient not taking: Reported on 01/07/2018 05/29/17   Thomasene Lot, DO    Allergies   Allergies  Allergen Reactions  . 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 female 53mo s/p SAH and coiling of basilar aneurysm with postop SCA stroke. Unclear etiology of right groin pain. Would be unusual to have delayed onset of femoral neuropathy from initial endovascular procedure.  Plan  - Will proceed with f/u diagnostic angiogram  We have discussed the risks/benefits/alternatives to angiogram. All questions were answered.

## 2018-01-07 NOTE — Sedation Documentation (Signed)
5 french exoseal deployed 

## 2018-01-08 ENCOUNTER — Ambulatory Visit: Payer: BLUE CROSS/BLUE SHIELD | Admitting: Family Medicine

## 2018-01-09 ENCOUNTER — Encounter: Payer: Self-pay | Admitting: Physical Therapy

## 2018-01-09 ENCOUNTER — Encounter: Payer: Self-pay | Admitting: Occupational Therapy

## 2018-01-09 ENCOUNTER — Ambulatory Visit: Payer: BLUE CROSS/BLUE SHIELD | Admitting: Occupational Therapy

## 2018-01-09 ENCOUNTER — Ambulatory Visit: Payer: BLUE CROSS/BLUE SHIELD | Admitting: Physical Therapy

## 2018-01-09 VITALS — BP 114/90 | HR 100

## 2018-01-09 VITALS — BP 120/90

## 2018-01-09 DIAGNOSIS — R278 Other lack of coordination: Secondary | ICD-10-CM

## 2018-01-09 DIAGNOSIS — R270 Ataxia, unspecified: Secondary | ICD-10-CM

## 2018-01-09 DIAGNOSIS — R41842 Visuospatial deficit: Secondary | ICD-10-CM

## 2018-01-09 DIAGNOSIS — R2681 Unsteadiness on feet: Secondary | ICD-10-CM

## 2018-01-09 DIAGNOSIS — R260 Ataxic gait: Secondary | ICD-10-CM | POA: Diagnosis not present

## 2018-01-09 DIAGNOSIS — R42 Dizziness and giddiness: Secondary | ICD-10-CM

## 2018-01-09 DIAGNOSIS — R208 Other disturbances of skin sensation: Secondary | ICD-10-CM

## 2018-01-09 DIAGNOSIS — I69054 Hemiplegia and hemiparesis following nontraumatic subarachnoid hemorrhage affecting left non-dominant side: Secondary | ICD-10-CM

## 2018-01-09 DIAGNOSIS — R2689 Other abnormalities of gait and mobility: Secondary | ICD-10-CM

## 2018-01-09 DIAGNOSIS — I69018 Other symptoms and signs involving cognitive functions following nontraumatic subarachnoid hemorrhage: Secondary | ICD-10-CM

## 2018-01-09 DIAGNOSIS — R414 Neurologic neglect syndrome: Secondary | ICD-10-CM

## 2018-01-09 DIAGNOSIS — R41840 Attention and concentration deficit: Secondary | ICD-10-CM

## 2018-01-09 NOTE — Addendum Note (Signed)
Addended by: Willa FraterFREEMAN, Quinita Kostelecky D on: 01/09/2018 05:47 PM   Modules accepted: Orders

## 2018-01-09 NOTE — Therapy (Signed)
[facility] [address] [place], [place], [address] Phone: [phone]   Fax:  [phone]  Physical Therapy Treatment  Patient Details  Name: [name] Barnett MRN: [id number] Date of Birth: May 03, 1976 Referring Provider: Charlett Blake, MD   Encounter Date: 01/09/2018  PT End of Session - 01/09/18 1634    Visit Number  45    Number of Visits  46    Date for PT Re-Evaluation  01/13/18    Authorization Type  BCBS    PT Start Time  1530    PT Stop Time  1615    PT Time Calculation (min)  45 min    Equipment Utilized During Treatment  Gait belt    Activity Tolerance  Patient tolerated treatment well    Behavior During Therapy  [facility] for tasks assessed/performed       Past Medical History:  Diagnosis Date  . Duodenal obstruction   . GERD (gastroesophageal reflux disease)   . Hypertension   . Stroke [facility])    april 2018    Past Surgical History:  Procedure Laterality Date  . ABDOMINAL HYSTERECTOMY    . BALLOON DILATION  12/31/2011   Procedure: BALLOON DILATION;  Surgeon: Missy Sabins, MD;  Location: [facility] ENDOSCOPY;  Service: Endoscopy;  Laterality: N/A;  . CHOLECYSTECTOMY  07/28/11  . ESOPHAGOGASTRODUODENOSCOPY  10/17/2011   Procedure: ESOPHAGOGASTRODUODENOSCOPY (EGD);  Surgeon: Missy Sabins, MD;  [facility];  Service: Endoscopy;  Laterality: N/A;  . IR ANGIO INTRA EXTRACRAN SEL INTERNAL CAROTID BILAT MOD SED  04/07/2017  . IR ANGIO INTRA EXTRACRAN SEL INTERNAL CAROTID BILAT MOD SED  01/07/2018  . IR ANGIO VERTEBRAL SEL VERTEBRAL UNI R MOD SED  04/07/2017  . IR ANGIO VERTEBRAL SEL VERTEBRAL UNI R MOD SED  01/07/2018  . IR ANGIOGRAM FOLLOW UP STUDY  04/07/2017  . IR ANGIOGRAM FOLLOW UP STUDY  04/07/2017  . IR ANGIOGRAM FOLLOW UP STUDY  04/07/2017  . IR ANGIOGRAM FOLLOW UP STUDY  04/07/2017  . IR ANGIOGRAM FOLLOW UP STUDY  04/07/2017  . IR ANGIOGRAM SELECTIVE EACH ADDITIONAL VESSEL  04/07/2017  . IR  TRANSCATH/EMBOLIZ  04/07/2017  . IR US GUIDE VASC ACCESS RIGHT  01/07/2018  . PARS PLANA VITRECTOMY Right 05/01/2017   Procedure: PARS PLANA VITRECTOMY WITH 25 GAUGE RIGHT EYE, endolaser photocoaglation;  Surgeon: Jalene Mullet, MD;  Location: [place];  Service: Ophthalmology;  Laterality: Right;  . RADIOLOGY WITH ANESTHESIA N/A 04/07/2017   Procedure: RADIOLOGY WITH ANESTHESIA;  Surgeon: Consuella Lose, MD;  Location: [place];  Service: Radiology;  Laterality: N/A;  . REPLACEMENT TOTAL KNEE BILATERAL  2004  . TEMPOROMANDIBULAR JOINT SURGERY     2 surgeries  . TUMOR REMOVAL    . VAGOTOMY  01/23/2012   Procedure: VAGOTOMY, antrectomy and BII;  Surgeon: Haywood Lasso, MD;  Location: [place];  Service: General;  Laterality: N/A;  Laparotomy with vagotomy.    Vitals:   01/09/18 1535  BP: 120/90    Subjective Assessment - 01/09/18 1535    Subjective  Still taking the Lyrica for LE pain.  Had an angiogram at the hospital.  Posterior aneurysm is gone, still has one they are going to monitor.  LE pain is 0/10.    Patient is accompained by:  Family member    Limitations  Standing;Walking    Currently in Pain?  No/denies    Pain Score  0-No pain    Pain Onset  More than a  month ago         [facility] PT Assessment - 01/09/18 1541      Assessment   Medical Diagnosis  SAH and L cerebellar infarct    Referring Provider  Charlett Blake, MD    Onset Date/Surgical Date  04/07/17      Precautions   Precautions  Fall    Precaution Comments  HTN ASSESS VITALS AT [facility] SESSION; dizziness and diplopia      Prior Function   Level of Independence  Independent      Observation/Other Assessments   Focus on Therapeutic Outcomes (FOTO)   did not assess today    Other Surveys   Other Surveys    Neuro Quality of Life   did not assess today      Ambulation/Gait   Ambulation/Gait  Yes    Ambulation/Gait Assistance  4: Min assist    Ambulation Distance (Feet)  115 Feet    Assistive device  None     Gait Pattern  Step-through pattern;Decreased step length - right;Decreased step length - left;Decreased stance time - left;Decreased stride length;Decreased hip/knee flexion - left;Decreased weight shift to left;Ataxic;Decreased trunk rotation    Ambulation Surface  Level;Indoor    Stairs  Yes    Stairs Assistance  4: Min assist    Stair Management Technique  One rail Right;One rail Left;Alternating pattern;Forwards    Number of Stairs  8    Height of Stairs  6      Standardized Balance Assessment   10 Meter Walk  13.31 seconds or 2.84f/sec      Berg Balance Test   Sit to Stand  Able to stand without using hands and stabilize independently    Standing Unsupported  Able to stand safely 2 minutes    Sitting with Back Unsupported but Feet Supported on Floor or Stool  Able to sit safely and securely 2 minutes    Stand to Sit  Sits safely with minimal use of hands    Transfers  Able to transfer safely, minor use of hands    Standing Unsupported with Eyes Closed  Able to stand 10 seconds safely    Standing Ubsupported with Feet Together  Needs help to attain position but able to stand for 30 seconds with feet together    From Standing, Reach Forward with Outstretched Arm  Can reach confidently >25 cm (10")    From Standing Position, Pick up Object from Floor  Able to pick up shoe safely and easily    From Standing Position, Turn to Look Behind Over each Shoulder  Looks behind from both sides and weight shifts well    Turn 360 Degrees  Able to turn 360 degrees safely one side only in 4 seconds or less    Standing Unsupported, Alternately Place Feet on Step/Stool  Able to complete 4 steps without aid or supervision    Standing Unsupported, One Foot in Front  Able to take small step independently and hold 30 seconds    Standing on One Leg  Able to lift leg independently and hold equal to or more than 3 seconds    Total Score  46      Dynamic Gait Index   Level Surface  Moderate Impairment     Change in Gait Speed  Mild Impairment    Gait with Horizontal Head Turns  Mild Impairment    Gait with Vertical Head Turns  Mild Impairment    Gait and Pivot Turn  Mild Impairment    Step Over Obstacle  Mild Impairment    Step Around Obstacles  Mild Impairment    Steps  Mild Impairment    Total Score  15    DGI comment:  15/24                          PT Education - 01/09/18 1633    Education provided  Yes    Education Details  Progress towards goals, areas to continue to focus on    Person(s) Educated  Patient;Spouse    Methods  Explanation    Comprehension  Verbalized understanding       PT Short Term Goals - 12/17/17 1440      PT SHORT TERM GOAL #1   Title  Pt will improve DGI by 4 points from initial assessment    Baseline  10/24, 11/24    Status  Not Met      PT SHORT TERM GOAL #2   Title  Pt will decrease falls risk with gait as indicated by increase in gait velocity with LRAD to > or = 2.0 ft/sec    Baseline  1.55 ft/sec when experiencing pain in RLE, 1.8 ft/sec RW    Status  Partially Met      PT SHORT TERM GOAL #3   Title  Pt will decrease falls risk as indicated by increase in BERG balance score to > or = 40/56    Baseline  34/56 when experiencing increased pain in RLE, 43/56 on 12/13/17    Status  Achieved      PT SHORT TERM GOAL #4   Title  Pt will ambulate >200' on indoor surfaces with cane (or no AD) and negotiate 4 stairs with one rail, alternating sequence with min A     Baseline  12/17/17: met stair portion of gaol only.    Status  Partially Met        PT Long Term Goals - 01/09/18 1613      PT LONG TERM GOAL #1   Title  Pt and husband will demonstrate independence with HEP    Time  8    Period  Weeks    Status  On-going      PT LONG TERM GOAL #2   Title  Pt will demonstrate improved balance and decreased falls risk as indicated by BERG balance score of > or = 45/56    Baseline  46/56    Time  8    Period  Weeks    Status   Achieved      PT LONG TERM GOAL #3   Title  Pt will ambulate with cane (or no AD) x 250' over outdoor paved surfaces with min A; will negotiate 4 stairs with one rail with alternating sequence and supervision    Time  8    Period  Weeks    Status  On-going      PT LONG TERM GOAL #4   Title  Pt will report 15% improvement in Neuro QOL-LE    Baseline  31.3%    Time  8    Period  Weeks    Status  On-going      PT LONG TERM GOAL #5   Title  Pt will decrease falls risk during gait in home/community as indicated by increase in gait velocity to > or = 2.0 ft/sec with LRAD    Baseline  2.4 ft/sec  Time  8    Period  Weeks    Status  Achieved      PT LONG TERM GOAL #6   Title  Pt will demonstrate decreased falls risk with gait as indicated by increase in DGI score to >/= 15/24 with LRAD    Baseline  15/24    Time  8    Period  Weeks    Status  Achieved            Plan - 01/09/18 1634    Clinical Impression Statement  Initiated assessment of progress towards LTG.  Pt's pain in RLE has significantly improved and pt has made steady progress towards goals with overall improvements in static and dynamic balance, weight shifting and maintaining COG in midline.  Pt has met BERG, DGI and gait velocity goals.  Will complete assessment at next visit and recertify for 8 more weeks.    Rehab Potential  Good    PT Frequency  2x / week    PT Duration  8 weeks    PT Treatment/Interventions  ADLs/Self Care Home Management;Aquatic Therapy;DME Instruction;Gait training;Stair training;Functional mobility training;Therapeutic activities;Therapeutic exercise;Balance training;Neuromuscular re-education;Patient/family education;Vestibular;Visual/perceptual remediation/compensation;Cognitive remediation;Orthotic Fit/Training    PT Next Visit Plan  Finish checking LTG 1, 3, 4 (FOTO).  Send to me for recertification.  Leave RW with family; walk to/from waiting area without AD.  Continue graded resistance  training during gait - sports cord or therapist applied; corner balance with narrow BOS/head turns -finding midline    Consulted and Agree with Plan of Care  Patient;Family member/caregiver    Family Member Consulted  husband       Patient will benefit from skilled therapeutic intervention in order to improve the following deficits and impairments:  Abnormal gait, Decreased balance, Decreased cognition, Decreased coordination, Decreased strength, Difficulty walking, Dizziness, Impaired sensation, Impaired vision/preception, Pain, Decreased activity tolerance, Decreased mobility  Visit Diagnosis: Ataxic gait  Other lack of coordination  Unsteadiness on feet  Dizziness and giddiness  Other abnormalities of gait and mobility     Problem List Patient Active Problem List   Diagnosis Date Noted  . 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 Weakness with dizziness-  since [facility] and CVA 04/07/17 08/07/2017  . Disturbances of vision, late effect of stroke 08/06/2017  . Health education/counseling 08/05/2017  . High risk medications (not anticoagulants) long-term use 08/05/2017  . Neuritis-  R sided:  arm and leg/ body due to stroke 08/05/2017  . Elevated LDL cholesterol level 07/11/2017  . [employer] [facility] (NIH) Stroke Scale limb ataxia score 2, ataxia present in two limbs 06/25/2017  . 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 Elevated vitamin B12 level 05/30/2017  . Vitamin D deficiency 05/29/2017  . History of tobacco abuse-  30pk yr hx - quit 04/07/17 05/21/2017  . Gait disturbance, post-stroke 05/14/2017  . Benign essential HTN   . Vitreous hemorrhage of right eye ([place])   . Adjustment disorder with mixed anxiety and depressed mood   . 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 s/p SAH (subarachnoid hemorrhage) ([place]) 04/19/2017  . 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 Hypoxia   . Subarachnoid  hemorrhage due to ruptured aneurysm ([place]) 04/07/2017  . CVA (cerebral vascular accident) ([place]) 04/07/2017  . Elevated gastrin level 01/25/2012  . Hypokalemia 01/21/2012  . Nausea & vomiting 01/20/2012  . Epigastric pain 01/20/2012  . Duodenal ulcer, acute  with obstruction 10/17/2011  . S/P laparoscopic cholecystectomy 10/16/2011    Rico Junker, PT, DPT 01/09/18    4:39 PM    [place] [address] [place], [place], [address] Phone: [phone]   Fax:  [phone]  Name: [name] Barnett MRN: [id number] Date of Birth: 09-01-1976

## 2018-01-09 NOTE — Therapy (Signed)
[facility] [address] [place], [place], [address] Phone: [phone]   Fax:  [phone]  Occupational Therapy Treatment  Patient Details  Name: [name] Barnett MRN: [id number] Date of Birth: 03/25/1976 Referring Provider: Charlett Blake, MD   Encounter Date: 01/09/2018  OT End of Session - 01/09/18 1459    Visit Number  40    Number of Visits  50    Date for OT Re-Evaluation  02/13/18    Authorization Type  [employer], no visit limit/auth req.    Authorization Time Period  renewal completed 11/14/17 for 8 wks.  renewal completed 01/09/18 for 5 weeks/10 visits    OT Start Time  1451    OT Stop Time  1532    OT Time Calculation (min)  41 min    Activity Tolerance  Patient tolerated treatment well    Behavior During Therapy  [facility] for tasks assessed/performed       Past Medical History:  Diagnosis Date  . Duodenal obstruction   . GERD (gastroesophageal reflux disease)   . Hypertension   . Stroke [facility])    april 2018    Past Surgical History:  Procedure Laterality Date  . ABDOMINAL HYSTERECTOMY    . BALLOON DILATION  12/31/2011   Procedure: BALLOON DILATION;  Surgeon: Missy Sabins, MD;  Location: [facility] ENDOSCOPY;  Service: Endoscopy;  Laterality: N/A;  . CHOLECYSTECTOMY  07/28/11  . ESOPHAGOGASTRODUODENOSCOPY  10/17/2011   Procedure: ESOPHAGOGASTRODUODENOSCOPY (EGD);  Surgeon: Missy Sabins, MD;  Location: [facility] ENDOSCOPY;  Service: Endoscopy;  Laterality: N/A;  . IR ANGIO INTRA EXTRACRAN SEL INTERNAL CAROTID BILAT MOD SED  04/07/2017  . IR ANGIO INTRA EXTRACRAN SEL INTERNAL CAROTID BILAT MOD SED  01/07/2018  . IR ANGIO VERTEBRAL SEL VERTEBRAL UNI R MOD SED  04/07/2017  . IR ANGIO VERTEBRAL SEL VERTEBRAL UNI R MOD SED  01/07/2018  . IR ANGIOGRAM FOLLOW UP STUDY  04/07/2017  . IR ANGIOGRAM FOLLOW UP STUDY  04/07/2017  . IR ANGIOGRAM FOLLOW UP STUDY  04/07/2017  . IR ANGIOGRAM FOLLOW UP STUDY  04/07/2017  . IR ANGIOGRAM FOLLOW UP  STUDY  04/07/2017  . IR ANGIOGRAM SELECTIVE EACH ADDITIONAL VESSEL  04/07/2017  . IR TRANSCATH/EMBOLIZ  04/07/2017  . IR US GUIDE VASC ACCESS RIGHT  01/07/2018  . PARS PLANA VITRECTOMY Right 05/01/2017   Procedure: PARS PLANA VITRECTOMY WITH 25 GAUGE RIGHT EYE, endolaser photocoaglation;  Surgeon: Jalene Mullet, MD;  Location: [place] [facility];  Service: Ophthalmology;  Laterality: Right;  . RADIOLOGY WITH ANESTHESIA N/A 04/07/2017   Procedure: RADIOLOGY WITH ANESTHESIA;  Surgeon: Consuella Lose, MD;  Location: [facility];  Service: Radiology;  Laterality: N/A;  . REPLACEMENT TOTAL KNEE BILATERAL  2004  . TEMPOROMANDIBULAR JOINT SURGERY     2 surgeries  . TUMOR REMOVAL    . VAGOTOMY  01/23/2012   Procedure: VAGOTOMY, antrectomy and BII;  Surgeon: Haywood Lasso, MD;  Location: [facility];  Service: General;  Laterality: N/A;  Laparotomy with vagotomy.    Vitals:   01/09/18 1500 01/09/18 1511  BP: (!) 126/102 114/90  Pulse: 90 100    Subjective Assessment - 01/09/18 1456    Subjective   no pain.  Pt reports BP meds have been making her dizzy so she stopped taking them.    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  No/denies    Pain Onset  More than a month ago         Checked goals and discussed progress.--see below.  Ambulating within session without RW with min facilitation/CGA for wt. Shift, safety, and impulsivity.  In standing, functional reaching to grasp/release cylinder objects (floor>overhead) with LUE incorporating wt. Shift to the L and controlled squat as well as LUE control with focus on normal movement  patterns.  Min facilitation provided for normal movement patterns and safety.   Pt with multiple drops.  Also added trunk rotations.    Sit>stand, stand>sit, with min v.c. For safety/control and normal movement patterns.                    OT Education - 01/09/18 1642    Education Details  Educated pt on importance of controlled BP and recommended pt notify MD that she stopped taking BP meds and why and not to stop taking meds without consulting with MD    Person(s) Educated  Patient;Spouse    Methods  Explanation    Comprehension  Verbalized understanding       OT Short Term Goals - 01/09/18 1710      OT SHORT TERM GOAL #2   Title  --      OT SHORT TERM GOAL #3   Title  --      OT SHORT TERM GOAL #4   Title  --      OT SHORT TERM GOAL #5   Title  --      OT SHORT TERM GOAL #6   Title  --      OT SHORT TERM GOAL #7   Title  Pt will complete functional task with LUE with no more than min cueing for impulsivity.--check STGs 12/14/16    Status  Achieved inconsistent.  01/09/18 met at approx this level      OT SHORT TERM GOAL #8   Title  Pt will perform simple home maintenance task/snack prep in standing with set-up/supervision and no more than 1 v.c. for safety.    Status  Achieved      OT SHORT TERM GOAL  #9   TITLE  Pt will improve coordination for ADLs as shown by completing 9-hole peg test in less than 45mn 30sec.    Baseline  246m 55.44sec    Status  Achieved 2 mins 43 secs.  12/12/17:  met 30m72m22.85sec        OT Long Term Goals - 01/09/18 1508      OT LONG TERM GOAL #1   Title  Pt/caregiver will be independent with updated HEP.--check LTGs 01/13/18    Time  8    Period  Weeks    Status  Achieved 11/14/17:  needs further updates.  01/09/18:  met      OT LONG TERM GOAL #2   Title  Pt will be use LUE as nondominant assist at least 75% of the time for ADLs without cueing.    Time  8    Period  Weeks    Status  Achieved uses 75% x however needs prompting  at times.  09/19/17:  met per pt  90%      OT LONG  TERM GOAL #3   Title  Pt will perform toileting mod I (including transfer).    Time  8    Period  Weeks    Status  Achieved supervision.  09/19/17:  continues to need superivsion for mobility.  11/05/17  met      OT LONG TERM GOAL #4   Title  Pt will improve LUE coordination/control to improve score on box and blocks to at least 27 with LUE in 2 trials.--revised 11/14/17    Baseline  19 blocks    Time  8    Period  Weeks    Status  Not Met 18, 19 blocks.  09/19/17:  25 blocks.  11/14/17:  19 blocks/inconsistent with frustration level.  01/09/18:  not met, 19 blocks      OT LONG TERM GOAL #5   Title  Pt will perform simple home maintenance task/snack prep in standing mod I.--updated 11/14/17    Time  8    Period  Weeks    Status  Achieved Pt is not perfoming consistely, she has made a sandwich in seated.  11/14/17:  approximating, supervision/min A and cueing.  12/12/17:  met for simple, selected tasks (primarily static activities)      OT LONG TERM GOAL #6   Title  Pt will perform simple environmental scanning/navigation with mod I.--revised 11/14/17    Time  8    Period  Weeks    Status  Partially Met 11/05/17  min cueing and supervision for mobility, environmental scanning with good accuracy.  01/09/18  continues to need supervision for safety for ambulation      OT LONG TERM GOAL #7   Title  Pt will improve coordination for ADLs as shown by completing 9-hole peg test in less than 72mn.    Baseline  267m 5.44sec    Time  8    Period  Weeks    Status  Achieved 01/09/18:  27m62m22.85sec      OT LONG TERM GOAL #8   Title  Pt will improve coordination for ADLs as shown by completing 9-hole peg test in less than 60sec.    Time  5    Period  Weeks    Status  New      OT LONG TERM GOAL  #9   Baseline  Pt will be independent with transitional movements during IADLs with no cueing for safety/impulsivity.    Time  5    Period  Weeks     Status  New      OT LONG TERM GOAL  #10   TITLE  Pt will be able to reach to retrieve lightweight object from overhead shelf consistently in 9/10 trials.    Time  5    Period  Weeks    Status  New      OT LONG TERM GOAL  #11   TITLE  Pt will be able to attend to functional task for at least 4m12mithout requiring redirection.    Time  5    Period  Weeks    Status  New            Plan - 01/09/18 1705    Clinical Impression Statement  Pt progressing with improving LUE control/coordination, demo improved RLE pain, and improved mobilty.  Pt would benefit from futher occupational therapy to continue to work on LUE functional use/control and safety/participation in IADLs.    Rehab Potential  Good    Current Impairments/barriers affecting progress:  cognitive deficits, impulsivity    OT Frequency  2x / week    OT Duration  -- 5weeks or 10 visits    OT Treatment/Interventions  Self-care/ADL training;Therapeutic exercise;DME and/or AE instruction;Therapeutic activities;Cognitive remediation/compensation;Visual/perceptual remediation/compensation;Passive range of motion;Functional Mobility Training;Neuromuscular education;Cryotherapy;Paraffin;Energy conservation;Manual Therapy;Patient/family education;Ultrasound;Balance training;Moist Heat;Fluidtherapy    Plan  renewal completed; continue with neuro re-ed for LUE/control    OT Home Exercise Plan  Education provided:  HEP for LUE control, coordination, and functional use    Recommended Other Services  Neuropsychological referral due to poor frustration tolerance, adjustment/coping    Consulted and Agree with Plan of Care  Patient;Family member/caregiver    Family Member Consulted  mother       Patient will benefit from skilled therapeutic intervention in order to improve the following deficits and impairments:  Decreased coordination, Decreased range of motion, Difficulty walking, Abnormal gait, Decreased safety awareness, Impaired  sensation, Decreased knowledge of precautions, Decreased balance, Decreased knowledge of use of DME, Impaired UE functional use, Decreased cognition, Decreased mobility, Decreased strength, Impaired vision/preception, Impaired perceived functional ability  Visit Diagnosis: Other lack of coordination  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 Weakness with dizziness-  since [facility] Hights and CVA 04/07/17 08/07/2017  . Disturbances of vision, late effect of stroke 08/06/2017  . Health education/counseling 08/05/2017  . High risk medications (not anticoagulants) long-term use 08/05/2017  . Neuritis-  R sided:  arm and leg/ body due to stroke 08/05/2017  . Elevated LDL cholesterol level 07/11/2017  . [employer] (NIH) Stroke Scale limb ataxia score 2, ataxia present in two limbs 06/25/2017  . 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 Elevated vitamin B12 level 05/30/2017  . Vitamin D deficiency 05/29/2017  . History of tobacco abuse-  30pk yr hx - quit 04/07/17 05/21/2017  . Gait disturbance, post-stroke 05/14/2017  . Benign essential HTN   . Vitreous hemorrhage of right eye ([place])   . Adjustment disorder with mixed anxiety and depressed mood   . 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 s/p SAH (subarachnoid hemorrhage) ([place]) 04/19/2017  . 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 Hypoxia   . Subarachnoid hemorrhage due to  ruptured aneurysm ([place]) 04/07/2017  . CVA (cerebral vascular accident) ([place]) 04/07/2017  . Elevated gastrin level 01/25/2012  . Hypokalemia 01/21/2012  . Nausea & vomiting 01/20/2012  . Epigastric pain 01/20/2012  . Duodenal ulcer, acute with obstruction 10/17/2011  . S/P laparoscopic cholecystectomy 10/16/2011    [facility] 01/09/2018, 5:36 PM  [facility] [address] [place], [place], [address] Phone: [phone]   Fax:  [phone]  Name: MALORIE BIGFORD MRN: [id number] Date of Birth: [birth year]   Vianne Bulls, OTR/L [facility] [address] [place], [place]  [address] [phone] phone [phone] 01/09/18 5:36 PM

## 2018-01-09 NOTE — Therapy (Deleted)
[facility] [address] [place], [place], [address] Phone: [phone]   Fax:  [phone]  Patient Details  Name: [name] Barnett MRN: [id number] Date of Birth: 04/21/1976 Referring Provider:  Thomasene Lotpalski, Deborah, DO  Encounter Date: 01/09/2018   [name] 01/09/2018, 5:33 PM  [facility] [address] [place], [place], [address] Phone: [phone]   Fax:  [phone]

## 2018-01-13 ENCOUNTER — Ambulatory Visit: Payer: BLUE CROSS/BLUE SHIELD | Admitting: Family Medicine

## 2018-01-13 ENCOUNTER — Encounter: Payer: Self-pay | Admitting: Family Medicine

## 2018-01-13 VITALS — BP 119/82 | HR 108 | Ht 63.25 in | Wt 160.0 lb

## 2018-01-13 DIAGNOSIS — R531 Weakness: Secondary | ICD-10-CM | POA: Diagnosis not present

## 2018-01-13 DIAGNOSIS — I639 Cerebral infarction, unspecified: Secondary | ICD-10-CM

## 2018-01-13 DIAGNOSIS — M792 Neuralgia and neuritis, unspecified: Secondary | ICD-10-CM | POA: Diagnosis not present

## 2018-01-13 DIAGNOSIS — E7800 Pure hypercholesterolemia, unspecified: Secondary | ICD-10-CM | POA: Diagnosis not present

## 2018-01-13 DIAGNOSIS — I725 Aneurysm of other precerebral arteries: Secondary | ICD-10-CM

## 2018-01-13 DIAGNOSIS — H6121 Impacted cerumen, right ear: Secondary | ICD-10-CM

## 2018-01-13 DIAGNOSIS — Z79899 Other long term (current) drug therapy: Secondary | ICD-10-CM

## 2018-01-13 DIAGNOSIS — Z87891 Personal history of nicotine dependence: Secondary | ICD-10-CM

## 2018-01-13 DIAGNOSIS — F4323 Adjustment disorder with mixed anxiety and depressed mood: Secondary | ICD-10-CM

## 2018-01-13 DIAGNOSIS — E559 Vitamin D deficiency, unspecified: Secondary | ICD-10-CM | POA: Diagnosis not present

## 2018-01-13 DIAGNOSIS — E876 Hypokalemia: Secondary | ICD-10-CM

## 2018-01-13 DIAGNOSIS — I10 Essential (primary) hypertension: Secondary | ICD-10-CM | POA: Diagnosis not present

## 2018-01-13 DIAGNOSIS — R42 Dizziness and giddiness: Secondary | ICD-10-CM

## 2018-01-13 NOTE — Progress Notes (Signed)
Impression and Recommendations:    1. Benign essential HTN   2. Adjustment disorder with mixed anxiety and depressed mood   3. Elevated LDL cholesterol level   4. s/p Cerebrovascular accident (CVA), unspecified mechanism ([name])   5. Basilar artery aneurysm ([name])   6. Neuritis-  R sided:  arm and leg/ body due to stroke   7. History of tobacco abuse-  30pk yr hx - quit 04/07/17   8. Hypokalemia   9. Vitamin D deficiency   10. High risk medications (not anticoagulants) long-term use   11. Weakness with dizziness-  since [facility] and CVA 04/07/17   12. Hearing loss of right ear due to cerumen impaction     1. Neurology - Please get all med records from Dr. Conchita Paris, neurologist.  - Ask neurologist what goal BP should be based on neurological events in the patient's brain/recent past medical history.  - Reviewed the fact that her neuropathic medicines can make her feel foggy-headed and off.  2. HTN - Patient should check her blood pressure twice daily.  Reviewed the goal of getting the patient's blood pressure well controlled.   - Patient will bring in BP log for next OV.  - Patient will hold her Diovan until next appointment.  Otherwise continue taking BP medications as prescribed.  - Patient should continue taking vitamins.  3. Mood - Patient is still taking the Prozac every day.  Patient was strongly advised to see a counselor.  Patient declined offer at this time.  - Reviewed the factors contributing to mood and stress management.  If we treat a patient with only one spoke of the wheel, we know that our likelihood of helping improve mood is less than 15%.  Reviewed spokes of the wheel - adding good sleep hygiene, adding counseling (a life coach, therapist) to help with stressors, adding exercise, adding healthy diet - that with the incorporation of these elements, the likelihood of treating stress and mood becomes greater than 80%.  - Given everything that she's been through, the  patient was thoroughly recommended to pursue as many avenues of health improvement as possible.  - Patient will wake up at 8:30 AM every day.  Go for a walk on treadmill or outside (paved, easy trail) every other day (MWFSun).  Walk for at least 20 minutes each time.  - Advised pursuing 30 minutes of exercise 5-7 days per week.  Patient should try to work up to 150 minutes of cardiovascular activity per week according to guidelines established by the [facility].  - Reviewed the overall benefits of adopting a healthier lifestyle, including a healthier diet.    - Patient agrees to try to eat something twice daily.  NO SKIPPING MEALS.  - Adequate hydration advised and encouraged.  Patient should be consuming half of her body weight in ounces of water per day.  4. Follow-Up - Patient will return for follow-up in 3-4 weeks.  Will review BP at that time.  - Patient should obtain fasting blood work 2-3 days prior to next OV.   Education and routine counseling performed. Handouts provided.   Orders Placed This Encounter  Procedures  . Hemoglobin A1c  . Lipid panel  . Comprehensive metabolic panel  . Magnesium  . Phosphorus  . Vitamin B12  . VITAMIN D 25 Hydroxy (Vit-D Deficiency, Fractures)  . TSH  . T4, free  . T3, free    No orders of the defined types were placed in this encounter.  The patient was counseled, risk factors were discussed, anticipatory guidance given.  Gross side effects, risk and benefits, and alternatives of medications discussed with patient.  Patient is aware that all medications have potential side effects and we are unable to predict every side effect or drug-drug interaction that may occur.  Expresses verbal understanding and consents to current therapy plan and treatment regimen.  Return for 3-4 wks f/up OV with me to go over BP log; [name].  Please see AVS handed out to patient at the end of our visit for further patient instructions/ counseling done pertaining to  today's office visit.    Note: This document was prepared using Dragon voice recognition software and may include unintentional dictation errors.   This document serves as a record of services personally performed by Thomasene Loteborah Zaynab Chipman, DO. It was created on her behalf by Peggye FothergillKatherine Galloway, a trained medical scribe. The creation of this record is based on the scribe's personal observations and the provider's statements to them.   I have reviewed the above medical documentation for accuracy and completeness and I concur.  Thomasene LotDeborah Ambria Mayfield 02/12/18 2:48 PM    Subjective:    HPI: [name] Barnett is a 42 y.o. female who [facility] for follow up for HTN.    Head Injury Per patient, one of her cerebral hemorrages is better, and the other one has not grown.  Neurologist saw her on 1/29 for a cerebral angiogram, then a cerebral angioplasty through the right groin - had a femoral sheath placed.  Patient notes that she's "had a lot done recently due to her nerves."  Due to the pain in her leg, she can't bend over, and cannot cross her leg.  Her neurologist Dr. Conchita ParisNundkumar believes that her nerve pain is due to her hip.  Per patient, "he said there was no way that what he did caused the nerve pain because he's done thousands of them."  Per Dr. Wynn BankerKirsteins, notes that her pain is worse, and that this pain started after the femoral sheath was placed.  Patient notes that Kirsteins never said it was her hip causing her pain.  Currently taking Lyrica and Gabapentin for her nerve pain.  She's learning to walk again, but her blood pressure was going way down, at about 104/74. Was experiencing dizziness with the low blood pressure.  Patient notes that she does not eat often.  Mood Continues taking Prozac.  HTN:  -  Her blood pressure has not been controlled at home.   Patient has not been taking her blood pressure at home for the past 5-7  days. Historically, her blood ack in October her BP was 140/84.  With Dr. Wynn BankerKirsteins it was higher.  She does not know when the blood pressure began going low. It's only been the last couple of weeks that she's been feeling dizzy.  Blood pressure was 104/74 with Dr. Conchita ParisNundkumar on 1/29.  He told her to stop taking one of her BP pills. She stopped taking both pills for 2 days, and felt good for those 2 days.  Went back to therapy on 01/31.  BP at that time went up to 120/90.  They got back on the amlodipine.  Currently ONLY taking amlodipine.  - Denies medication S-E   - Smoking Status noted   - She denies new onset of: chest pain, exercise intolerance, shortness of breath, dizziness, visual changes, headache, lower extremity swelling or claudication.  Last 3 blood pressure readings in our office are as follows: BP Readings from Last 3 Encounters:  02/11/18 (!) 136/92  02/06/18 114/82  02/06/18 (!) 141/86     Pulse Readings from Last 3 Encounters:  02/11/18 76  02/04/18 (!) 109  01/30/18 (!) 106     Filed Weights   01/13/18 1637  Weight: 160 lb (72.6 kg)      Patient Care Team    Relationship Specialty Notifications Start End  Thomasene Lot, DO PCP - General Family Medicine  04/30/17   Charlott Rakes, MD Consulting Physician Gastroenterology  10/17/11   Lisbeth Renshaw, MD Consulting Physician Neurosurgery  05/21/17   Erick Colace, MD Consulting Physician Physical Medicine and Rehabilitation  05/21/17   Olivia Mackie, MD Consulting Physician Obstetrics and Gynecology  05/21/17   Carmela Rima, MD Consulting Physician Ophthalmology  06/13/17    Comment: Neuro ophthalmologist     Lab Results  Component Value Date   CREATININE 0.74 01/07/2018   BUN 15 01/07/2018   NA 140 01/07/2018   K 4.4 01/07/2018   CL 110 01/07/2018   CO2 19 (L) 01/07/2018    Lab Results  Component Value Date   CHOL 213 (H) 05/21/2017    Lab Results  Component Value  Date   HDL 74 05/21/2017    Lab Results  Component Value Date   LDLCALC 111 (H) 05/21/2017    Lab Results  Component Value Date   TRIG 138 05/21/2017    Lab Results  Component Value Date   CHOLHDL 2.9 05/21/2017    No results found for: LDLDIRECT ===================================================================   Patient Active Problem List   Diagnosis Date Noted  . Neuritis-  R sided:  arm and leg/ body due to stroke 08/05/2017    Priority: High  . Elevated LDL cholesterol level 07/11/2017    Priority: High  . History of tobacco abuse-  30pk yr hx - quit 04/07/17 05/21/2017    Priority: High  . Benign essential HTN     Priority: High  . Basilar artery aneurysm ([name])     Priority: High  . 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 CVA (cerebral vascular accident) ([name]) 04/07/2017    Priority: High  . Vitamin D deficiency 05/29/2017    Priority: Medium  . Adjustment disorder with mixed anxiety and depressed mood     Priority: Medium  . 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 Gait disturbance, post-stroke 05/14/2017    Priority: Low  . Vitreous hemorrhage of right eye ([name])     Priority: Low  . s/p SAH (subarachnoid hemorrhage) ([name]) 04/19/2017    Priority: Low  . Duodenal ulcer, acute with obstruction 10/17/2011    Priority: Low  . 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 Weakness with dizziness-  since [facility] and CVA 04/07/17 08/07/2017  . Disturbances of vision, late effect of stroke 08/06/2017  . Health education/counseling 08/05/2017  . High risk medications (not anticoagulants) long-term use 08/05/2017  . [employer] (NI[facility]) Stroke Scale limb ataxia score 2, ataxia present in two limbs 06/25/2017  . Elevated vitamin B12 level 05/30/2017  . 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 Hypoxia   .  Elevated gastrin level 01/25/2012  . Hypokalemia 01/21/2012  . Nausea & vomiting 01/20/2012  . Epigastric pain 01/20/2012  . S/P  laparoscopic cholecystectomy 10/16/2011     Past Medical History:  Diagnosis Date  . Duodenal obstruction   . GERD (gastroesophageal reflux disease)   . Hypertension   . Stroke [facility])    april 2018     Past Surgical History:  Procedure Laterality Date  . ABDOMINAL HYSTERECTOMY    . BALLOON DILATION  12/31/2011   Procedure: BALLOON DILATION;  Surgeon: Barrie Folk, MD;  Location: [facility] ENDOSCOPY;  Service: Endoscopy;  Laterality: N/A;  . CHOLECYSTECTOMY  07/28/11  . ESOPHAGOGASTRODUODENOSCOPY  10/17/2011   Procedure: ESOPHAGOGASTRODUODENOSCOPY (EGD);  Surgeon: Barrie Folk, MD;  Location: [facility] ENDOSCOPY;  Service: Endoscopy;  Laterality: N/A;  . IR ANGIO INTRA EXTRACRAN SEL INTERNAL CAROTID BILAT MOD SED  04/07/2017  . IR ANGIO INTRA EXTRACRAN SEL INTERNAL CAROTID BILAT MOD SED  01/07/2018  . IR ANGIO VERTEBRAL SEL VERTEBRAL UNI R MOD SED  04/07/2017  . IR ANGIO VERTEBRAL SEL VERTEBRAL UNI R MOD SED  01/07/2018  . IR ANGIOGRAM FOLLOW UP STUDY  04/07/2017  . IR ANGIOGRAM FOLLOW UP STUDY  04/07/2017  . IR ANGIOGRAM FOLLOW UP STUDY  04/07/2017  . IR ANGIOGRAM FOLLOW UP STUDY  04/07/2017  . IR ANGIOGRAM FOLLOW UP STUDY  04/07/2017  . IR ANGIOGRAM SELECTIVE EACH ADDITIONAL VESSEL  04/07/2017  . IR TRANSCATH/EMBOLIZ  04/07/2017  . IR US GUIDE VASC ACCESS RIGHT  01/07/2018  . PARS PLANA VITRECTOMY Right 05/01/2017   Procedure: PARS PLANA VITRECTOMY WITH 25 GAUGE RIGHT EYE, endolaser photocoaglation;  Surgeon: Carmela Rima, MD;  Location: [facility] OR;  Service: Ophthalmology;  Laterality: Right;  . RADIOLOGY WITH ANESTHESIA N/A 04/07/2017   Procedure: RADIOLOGY WITH ANESTHESIA;  Surgeon: Lisbeth Renshaw, MD;  Location: [facility] OR;  Service: Radiology;  Laterality: N/A;  . REPLACEMENT TOTAL KNEE BILATERAL  2004  . TEMPOROMANDIBULAR JOINT SURGERY     2 surgeries  . TUMOR REMOVAL    .  VAGOTOMY  01/23/2012   Procedure: VAGOTOMY, antrectomy and BII;  Surgeon: Currie Paris, MD;  Location: [facility] OR;  Service: General;  Laterality: N/A;  Laparotomy with vagotomy.     Family History  Problem Relation Age of Onset  . Hypertension Mother   . Restless legs syndrome Mother   . Hyperlipidemia Mother   . Heart disease Father   . Malignant hyperthermia Neg Hx      Social History   Substance and Sexual Activity  Drug Use No  ,  Social History   Substance and Sexual Activity  Alcohol Use No  ,  Social History   Tobacco Use  Smoking Status Former Smoker  . Packs/day: 1.00  . Years: 20.00  . Pack years: 20.00  . Types: Cigarettes  . Last attempt to quit: 04/07/2017  . Years since quitting: 0.8  Smokeless Tobacco Never Used  ,    Current Outpatient Medications on File Prior to Visit  Medication Sig Dispense Refill  . amLODipine (NORVASC) 2.5 MG tablet Take 2.5 mg by mouth daily.  1  . atorvastatin (LIPITOR) 20 MG tablet Take 1 tablet (20 mg total) by mouth at bedtime. 90 tablet 3  . Cholecalciferol (VITAMIN D3) 5000 units TABS 5,000 IU OTC vitamin D3 daily. 90 tablet 3  . Coconut Oil 1000 MG CAPS Take 1,000 mg by mouth daily.    [name] FLUoxetine (PROZAC) 20 MG capsule Take 2 capsules (40 mg total) by mouth daily. (Patient taking differently: Take 20 mg by mouth 2 (two) times daily. ) 180 capsule 1  .  gabapentin (NEURONTIN) 600 MG tablet Take 1 tablet (600 mg total) by mouth 4 (four) times daily. (Patient taking differently: Take 600 mg by mouth 4 (four) times daily as needed (nerve pain). ) 360 tablet 1  . Multiple Vitamin (MULTIVITAMIN WITH MINERALS) TABS tablet Take 1 tablet by mouth daily.    . niacin 500 MG tablet Take 500 mg by mouth at bedtime.    . topiramate (TOPAMAX) 50 MG tablet Take 1 tablet (50 mg total) by mouth 2 (two) times daily. 90 tablet 0  . Vitamin D, Ergocalciferol, (DRISDOL) 50000 units CAPS capsule Take 1 capsule (50,000 Units total) by mouth  every 7 (seven) days. 12 capsule 10  . valsartan (DIOVAN) 40 MG tablet Take 0.5 tablets (20 mg total) by mouth daily. 45 tablet 1   No current facility-administered medications on file prior to visit.      Allergies  Allergen Reactions  . Nsaids Other (See Comments)    Pt diagnosed with near-obstructing circumferential ulcer of duodenum [id number]     Review of Systems:   General:  Denies fever, chills Optho/Auditory:   Denies visual changes, blurred vision Respiratory:   Denies SOB, cough, wheeze, DIB  Cardiovascular:   Denies chest pain, palpitations, painful respirations Gastrointestinal:   Denies nausea, vomiting, diarrhea.  Endocrine:     Denies new hot or cold intolerance Musculoskeletal:  Denies joint swelling, gait issues, or new unexplained myalgias/ arthralgias Skin:  Denies rash, suspicious lesions  Neurological:    Denies dizziness, unexplained weakness, numbness  Psychiatric/Behavioral:   Denies mood changes  Objective:    Blood pressure 119/82, pulse (!) 108, height 5' 3.25" (1.607 m), weight 160 lb (72.6 kg).  Body mass index is 28.12 kg/m.  General: Well Developed, well nourished, and in no acute distress.  HEENT: Normocephalic, atraumatic, pupils equal round reactive to light, neck supple, No carotid bruits, no JVD Skin: Warm and dry, cap RF less 2 sec Cardiac: Regular rate and rhythm, S1, S2 WNL's, no murmurs rubs or gallops Respiratory: ECTA B/L, Not using accessory muscles, speaking in full sentences. NeuroM-Sk: Ambulates w/o assistance, moves ext * 4 w/o difficulty, sensation grossly intact.  Ext: scant edema b/l lower ext Psych: No HI/SI, judgement and insight good, Euthymic mood. Full Affect.

## 2018-01-13 NOTE — Patient Instructions (Addendum)
Please get all med records from Neuro Doc- Dr Albertina SenegalNukkumar.   ASK HIM WHAT YOUR GOAL BP IS based on your neurological events and recent past med hx.   - check BP twice daily and bring in log for me next OV!!    - come in for Fasting bldwrk 2 days prior to next OV with me  - wake up around 830am everyday and go for a walk on treadmill or outside for at least 20min . - M, W, F, Sun  - going to try to eat something twice daily everyday. No skipping meals.

## 2018-01-14 ENCOUNTER — Ambulatory Visit: Payer: BLUE CROSS/BLUE SHIELD | Attending: Physical Medicine & Rehabilitation | Admitting: Physical Therapy

## 2018-01-14 ENCOUNTER — Encounter: Payer: Self-pay | Admitting: Physical Therapy

## 2018-01-14 ENCOUNTER — Encounter: Payer: Self-pay | Admitting: Occupational Therapy

## 2018-01-14 ENCOUNTER — Ambulatory Visit: Payer: BLUE CROSS/BLUE SHIELD | Admitting: Occupational Therapy

## 2018-01-14 VITALS — BP 112/85 | HR 81

## 2018-01-14 DIAGNOSIS — R278 Other lack of coordination: Secondary | ICD-10-CM | POA: Diagnosis present

## 2018-01-14 DIAGNOSIS — R42 Dizziness and giddiness: Secondary | ICD-10-CM | POA: Diagnosis present

## 2018-01-14 DIAGNOSIS — R260 Ataxic gait: Secondary | ICD-10-CM | POA: Insufficient documentation

## 2018-01-14 DIAGNOSIS — R208 Other disturbances of skin sensation: Secondary | ICD-10-CM

## 2018-01-14 DIAGNOSIS — R414 Neurologic neglect syndrome: Secondary | ICD-10-CM | POA: Diagnosis present

## 2018-01-14 DIAGNOSIS — R2689 Other abnormalities of gait and mobility: Secondary | ICD-10-CM | POA: Diagnosis present

## 2018-01-14 DIAGNOSIS — R2681 Unsteadiness on feet: Secondary | ICD-10-CM | POA: Diagnosis not present

## 2018-01-14 DIAGNOSIS — R41840 Attention and concentration deficit: Secondary | ICD-10-CM | POA: Insufficient documentation

## 2018-01-14 DIAGNOSIS — I69054 Hemiplegia and hemiparesis following nontraumatic subarachnoid hemorrhage affecting left non-dominant side: Secondary | ICD-10-CM | POA: Diagnosis present

## 2018-01-14 NOTE — Therapy (Signed)
[facility] [address] [place], [place], [address] Phone: [phone]   Fax:  [phone]  Occupational Therapy Treatment  Patient Details  Name: [name] Barnett MRN: [id number] Date of Birth: 07/13/1976 Referring Provider: Charlett Blake, MD   Encounter Date: 01/14/2018  OT End of Session - 01/14/18 1716    Visit Number  41    Number of Visits  60    Date for OT Re-Evaluation  02/13/18    Authorization Type  BCBS, no visit limit/auth req.    Authorization Time Period  renewal completed 11/14/17 for 8 wks.  renewal completed 01/09/18 for 5 weeks/10 visits    OT Start Time  1535    OT Stop Time  1615    OT Time Calculation (min)  40 min    Activity Tolerance  Patient tolerated treatment well    Behavior During Therapy  WFL for tasks assessed/performed       Past Medical History:  Diagnosis Date  . Duodenal obstruction   . GERD (gastroesophageal reflux disease)   . Hypertension   . Stroke [facility])    april 2018    Past Surgical History:  Procedure Laterality Date  . ABDOMINAL HYSTERECTOMY    . BALLOON DILATION  12/31/2011   Procedure: BALLOON DILATION;  Surgeon: Missy Sabins, MD;  Location: [facility] ENDOSCOPY;  Service: Endoscopy;  Laterality: N/A;  . CHOLECYSTECTOMY  07/28/11  . ESOPHAGOGASTRODUODENOSCOPY  10/17/2011   Procedure: ESOPHAGOGASTRODUODENOSCOPY (EGD);  Surgeon: Missy Sabins, MD;  Location: [facility] ENDOSCOPY;  Service: Endoscopy;  Laterality: N/A;  . IR ANGIO INTRA EXTRACRAN SEL INTERNAL CAROTID BILAT MOD SED  04/07/2017  . IR ANGIO INTRA EXTRACRAN SEL INTERNAL CAROTID BILAT MOD SED  01/07/2018  . IR ANGIO VERTEBRAL SEL VERTEBRAL UNI R MOD SED  04/07/2017  . IR ANGIO VERTEBRAL SEL VERTEBRAL UNI R MOD SED  01/07/2018  . IR ANGIOGRAM FOLLOW UP STUDY  04/07/2017  . IR ANGIOGRAM FOLLOW UP STUDY  04/07/2017  . IR ANGIOGRAM FOLLOW UP STUDY  04/07/2017  . IR ANGIOGRAM FOLLOW UP STUDY  04/07/2017  . IR ANGIOGRAM FOLLOW UP  STUDY  04/07/2017  . IR ANGIOGRAM SELECTIVE EACH ADDITIONAL VESSEL  04/07/2017  . IR TRANSCATH/EMBOLIZ  04/07/2017  . IR US GUIDE VASC ACCESS RIGHT  01/07/2018  . PARS PLANA VITRECTOMY Right 05/01/2017   Procedure: PARS PLANA VITRECTOMY WITH 25 GAUGE RIGHT EYE, endolaser photocoaglation;  Surgeon: Jalene Mullet, MD;  Location: [place];  Service: Ophthalmology;  Laterality: Right;  . RADIOLOGY WITH ANESTHESIA N/A 04/07/2017   Procedure: RADIOLOGY WITH ANESTHESIA;  Surgeon: Consuella Lose, MD;  Location: [place];  Service: Radiology;  Laterality: N/A;  . REPLACEMENT TOTAL KNEE BILATERAL  2004  . TEMPOROMANDIBULAR JOINT SURGERY     2 surgeries  . TUMOR REMOVAL    . VAGOTOMY  01/23/2012   Procedure: VAGOTOMY, antrectomy and BII;  Surgeon: Haywood Lasso, MD;  Location: [place];  Service: General;  Laterality: N/A;  Laparotomy with vagotomy.    Vitals:   01/14/18 1543  BP: 112/85  Pulse: 81    Subjective Assessment - 01/14/18 1715    Pertinent History  Subarachnoid hemorrhage due to ruptured aneurysm, ataxia (L cerebellar); HTN, R side decr sensation, vertical diplopia/visual deficits, ataxia, Bilateral retinal hemorhage associated with SAH (with surgery on R eye in hospital and L approx 1 month ago)    Limitations  visual deficits, fall risk, impulsivity/cognitive deficits    Patient Stated Goals  be able to walk and use L arm    Currently in Pain?  No/denies          Treatment: tall kneeling while performing functional grasp release followed by standing with trunk rotation/ lateral weight shifts. Seated on balance disc shoulder flexion with 2.3 kg ball followed by diagonals with 3 lbs with bilateral UE's for increased trunk control, min v.c                   OT Short Term Goals - 01/09/18 1710      OT SHORT TERM GOAL #2   Title  --      OT SHORT TERM GOAL #3   Title  --      OT SHORT TERM GOAL #4   Title  --      OT SHORT TERM GOAL #5   Title  --      OT  SHORT TERM GOAL #6   Title  --      OT SHORT TERM GOAL #7   Title  Pt will complete functional task with LUE with no more than min cueing for impulsivity.--check STGs 12/14/16    Status  Achieved inconsistent.  01/09/18 met at approx this level      OT SHORT TERM GOAL #8   Title  Pt will perform simple home maintenance task/snack prep in standing with set-up/supervision and no more than 1 v.c. for safety.    Status  Achieved      OT SHORT TERM GOAL  #9   TITLE  Pt will improve coordination for ADLs as shown by completing 9-hole peg test in less than 52mn 30sec.    Baseline  255m 55.44sec    Status  Achieved 2 mins 43 secs.  12/12/17:  met 53m39m22.85sec        OT Long Term Goals - 01/09/18 1508      OT LONG TERM GOAL #1   Title  Pt/caregiver will be independent with updated HEP.--check LTGs 01/13/18    Time  8    Period  Weeks    Status  Achieved 11/14/17:  needs further updates.  01/09/18:  met      OT LONG TERM GOAL #2   Title  Pt will be use LUE as nondominant assist at least 75% of the time for ADLs without cueing.    Time  8    Period  Weeks    Status  Achieved uses 75% x however needs prompting at times.  09/19/17:  met per pt  90%      OT LONG TERM GOAL #3   Title  Pt will perform toileting mod I (including transfer).    Time  8    Period  Weeks    Status  Achieved supervision.  09/19/17:  continues to need superivsion for mobility.  11/05/17  met      OT LONG TERM GOAL #4   Title  Pt will improve LUE coordination/control to improve score on box and blocks to at least 27 with LUE in 2 trials.--revised 11/14/17    Baseline  19 blocks    Time  8    Period  Weeks    Status  Not Met 18, 19 blocks.  09/19/17:  25 blocks.  11/14/17:  19 blocks/inconsistent with frustration level.  01/09/18:  not met, 19 blocks      OT LONG TERM GOAL #5   Title  Pt will perform simple home maintenance task/snack prep  in standing mod I.--updated 11/14/17    Time  8    Period  Weeks    Status   Achieved Pt is not perfoming consistely, she has made a sandwich in seated.  11/14/17:  approximating, supervision/min A and cueing.  12/12/17:  met for simple, selected tasks (primarily static activities)      OT LONG TERM GOAL #6   Title  Pt will perform simple environmental scanning/navigation with mod I.--revised 11/14/17    Time  8    Period  Weeks    Status  Partially Met 11/05/17  min cueing and supervision for mobility, environmental scanning with good accuracy.  01/09/18  continues to need supervision for safety for ambulation      OT LONG TERM GOAL #7   Title  Pt will improve coordination for ADLs as shown by completing 9-hole peg test in less than 44mn.    Baseline  213m 5.44sec    Time  8    Period  Weeks    Status  Achieved 01/09/18:  3m33m22.85sec      OT LONG TERM GOAL #8   Title  Pt will improve coordination for ADLs as shown by completing 9-hole peg test in less than 60sec.    Time  5    Period  Weeks    Status  New      OT LONG TERM GOAL  #9   Baseline  Pt will be independent with transitional movements during IADLs with no cueing for safety/impulsivity.    Time  5    Period  Weeks    Status  New      OT LONG TERM GOAL  #10   TITLE  Pt will be able to reach to retrieve lightweight object from overhead shelf consistently in 9/10 trials.    Time  5    Period  Weeks    Status  New      OT LONG TERM GOAL  #11   TITLE  Pt will be able to attend to functional task for at least 14m79mithout requiring redirection.    Time  5    Period  Weeks    Status  New            Plan - 01/14/18 1716    Clinical Impression Statement  Pt is progressing towards goals with improving LUE control.    Rehab Potential  Good    OT Frequency  2x / week    OT Duration  -- 5 weeks    Plan   continue with neuro re-ed for LUE/control    OT Home Exercise Plan  Education provided:  HEP for LUE control, coordination, and functional use    Recommended Other Services  Neuropsychological  referral due to poor frustration tolerance, adjustment/coping    Consulted and Agree with Plan of Care  Patient;Family member/caregiver    Family Member Consulted  husband       Patient will benefit from skilled therapeutic intervention in order to improve the following deficits and impairments:  Decreased coordination, Decreased range of motion, Difficulty walking, Abnormal gait, Decreased safety awareness, Impaired sensation, Decreased knowledge of precautions, Decreased balance, Decreased knowledge of use of DME, Impaired UE functional use, Decreased cognition, Decreased mobility, Decreased strength, Impaired vision/preception, Impaired perceived functional ability  Visit Diagnosis: Other lack of coordination  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 Weakness with dizziness-  since [facility] and CVA 04/07/17 08/07/2017  . Disturbances of vision, late effect of stroke 08/06/2017  . Health education/counseling 08/05/2017  . High risk medications (not anticoagulants) long-term use 08/05/2017  . Neuritis-  R sided:  arm and leg/ body due to stroke 08/05/2017  . Elevated LDL cholesterol level 07/11/2017  . [employer] (NIH) Stroke Scale limb ataxia score 2, ataxia present in two limbs 06/25/2017  . 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 Elevated vitamin B12 level 05/30/2017  . Vitamin D deficiency 05/29/2017  . History of tobacco abuse-  30pk yr hx - quit 04/07/17 05/21/2017  . Gait disturbance, post-stroke 05/14/2017  . Benign essential HTN   . Vitreous hemorrhage of right eye ([place])   . Adjustment disorder with mixed anxiety and depressed mood    . 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 s/p SAH (subarachnoid hemorrhage) ([place]) 04/19/2017  . 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 Hypoxia   . Subarachnoid hemorrhage due to ruptured aneurysm ([place]) 04/07/2017  . CVA (cerebral vascular accident) ([place]) 04/07/2017  . Elevated gastrin level 01/25/2012  . Hypokalemia 01/21/2012  . Nausea & vomiting 01/20/2012  . Epigastric pain 01/20/2012  . Duodenal ulcer, acute with obstruction 10/17/2011  . S/P laparoscopic cholecystectomy 10/16/2011    RINE,KATHRYN 01/14/2018, 5:17 PM  [facility] [address] [place], [place], [address] Phone: [phone]   Fax:  [phone]  Name: KWEEN BACORN MRN: [id number] Date of Birth: 12-13-1975

## 2018-01-15 NOTE — Addendum Note (Signed)
Addended by: Bufford LopePOTTER, [place] Benham F on: 01/15/2018 12:51 PM   Modules accepted: Orders

## 2018-01-15 NOTE — Therapy (Signed)
[facility] [address] [place], [place], [address] Phone: [phone]   Fax:  [phone]  Physical Therapy Treatment  Patient Details  Name: [name] Barnett MRN: [id number] Date of Birth: 1976-09-13 Referring Provider: Charlett Blake, MD   Encounter Date: 01/14/2018  PT End of Session - 01/14/18 1621    Visit Number  49    Number of Visits  46    Date for PT Re-Evaluation  01/13/18    Authorization Type  BCBS    PT Start Time  1616    PT Stop Time  1659    PT Time Calculation (min)  43 min    Equipment Utilized During Treatment  Gait belt    Activity Tolerance  Patient tolerated treatment well    Behavior During Therapy  WFL for tasks assessed/performed       Past Medical History:  Diagnosis Date  . Duodenal obstruction   . GERD (gastroesophageal reflux disease)   . Hypertension   . Stroke [facility])    april 2018    Past Surgical History:  Procedure Laterality Date  . ABDOMINAL HYSTERECTOMY    . BALLOON DILATION  12/31/2011   Procedure: BALLOON DILATION;  Surgeon: Missy Sabins, MD;  Location: [facility] ENDOSCOPY;  Service: Endoscopy;  Laterality: N/A;  . CHOLECYSTECTOMY  07/28/11  . ESOPHAGOGASTRODUODENOSCOPY  10/17/2011   Procedure: ESOPHAGOGASTRODUODENOSCOPY (EGD);  Surgeon: Missy Sabins, MD;  Location: [facility] ENDOSCOPY;  Service: Endoscopy;  Laterality: N/A;  . IR ANGIO INTRA EXTRACRAN SEL INTERNAL CAROTID BILAT MOD SED  04/07/2017  . IR ANGIO INTRA EXTRACRAN SEL INTERNAL CAROTID BILAT MOD SED  01/07/2018  . IR ANGIO VERTEBRAL SEL VERTEBRAL UNI R MOD SED  04/07/2017  . IR ANGIO VERTEBRAL SEL VERTEBRAL UNI R MOD SED  01/07/2018  . IR ANGIOGRAM FOLLOW UP STUDY  04/07/2017  . IR ANGIOGRAM FOLLOW UP STUDY  04/07/2017  . IR ANGIOGRAM FOLLOW UP STUDY  04/07/2017  . IR ANGIOGRAM FOLLOW UP STUDY  04/07/2017  . IR ANGIOGRAM FOLLOW UP STUDY  04/07/2017  . IR ANGIOGRAM SELECTIVE EACH ADDITIONAL VESSEL  04/07/2017  . IR  TRANSCATH/EMBOLIZ  04/07/2017  . IR US GUIDE VASC ACCESS RIGHT  01/07/2018  . PARS PLANA VITRECTOMY Right 05/01/2017   Procedure: PARS PLANA VITRECTOMY WITH 25 GAUGE RIGHT EYE, endolaser photocoaglation;  Surgeon: Jalene Mullet, MD;  Location: [place];  Service: Ophthalmology;  Laterality: Right;  . RADIOLOGY WITH ANESTHESIA N/A 04/07/2017   Procedure: RADIOLOGY WITH ANESTHESIA;  Surgeon: Consuella Lose, MD;  Location: [place];  Service: Radiology;  Laterality: N/A;  . REPLACEMENT TOTAL KNEE BILATERAL  2004  . TEMPOROMANDIBULAR JOINT SURGERY     2 surgeries  . TUMOR REMOVAL    . VAGOTOMY  01/23/2012   Procedure: VAGOTOMY, antrectomy and BII;  Surgeon: Haywood Lasso, MD;  Location: [place];  Service: General;  Laterality: N/A;  Laparotomy with vagotomy.    There were no vitals filed for this visit.  Subjective Assessment - 01/14/18 1617    Subjective  No new complaints. No falls or pain to report. Does have some soreness after OT session.     Patient is accompained by:  Family member    Limitations  Standing;Walking    Currently in Pain?  No/denies    Pain Score  0-No pain            [facility] Adult PT Treatment/Exercise - 01/14/18 1623      Transfers  Transfers  Sit to Stand;Stand to Sit    Sit to Stand  4: Min guard;With upper extremity assist;From bed;From chair/3-in-1    Sit to Stand Details  Verbal cues for sequencing;Verbal cues for gait pattern;Verbal cues for safe use of DME/AE    Stand to Sit  4: Min guard;With upper extremity assist;To bed;To chair/3-in-1    Stand to Sit Details (indicate cue type and reason)  Verbal cues for precautions/safety;Verbal cues for safe use of DME/AE      Ambulation/Gait   Ambulation/Gait  Yes    Ambulation/Gait Assistance  4: Min assist;3: Mod assist second person for safety    Ambulation/Gait Assistance Details  cues on posture, step position, weight shifitng and to focus on gait when outdoors on paved surfaces, not enviroment at this time.  one episode of anterior balance loss needing up to mod assist to correct, otherwise min assist with 2cd person for safety    Ambulation Distance (Feet)  318 Feet    Assistive device  None    Gait Pattern  Step-through pattern;Decreased step length - right;Decreased step length - left;Decreased stance time - left;Decreased stride length;Decreased hip/knee flexion - left;Decreased weight shift to left;Ataxic;Decreased trunk rotation    Ambulation Surface  Level;Unlevel;Indoor;Outdoor;Paved      Self-Care   Self-Care  Other Self-Care Comments    Other Self-Care Comments   discussed current HEP. limited due to no one home with her to provide supervision.       Neuro Re-ed    Neuro Re-ed Details   for mm strenthening/mm re-education/balance: in tall kneeling- sustained pertubations in varied directions with emphasis on pt staying tall/resisting the push with loss of balance multiple times intitially and progressively getting better with balance; in tall kneeling lateral stepping out/in of knees, alternaitng sides, with emphasis on tall posture, hip extension, weight shifting and glut activation; seated at edge of mat: pt performing partial stands for low suqats progressing to no UE support for last 5 reps, 10 reps total, with 5 sec hold each one. min guard assist to min assist for balance with cues on ex form/technique; standing in center of 5 color circle dots in semi circle pattern: toe tap to each/back in to stance in center with emphasis on weight shifitng, 3 circle each leg. min to mod assist for balance.                                  PT Short Term Goals - 12/17/17 1440      PT SHORT TERM GOAL #1   Title  Pt will improve DGI by 4 points from initial assessment    Baseline  10/24, 11/24    Status  Not Met      PT SHORT TERM GOAL #2   Title  Pt will decrease falls risk with gait as indicated by increase in gait velocity with LRAD to > or = 2.0 ft/sec    Baseline  1.55 ft/sec when  experiencing pain in RLE, 1.8 ft/sec RW    Status  Partially Met      PT SHORT TERM GOAL #3   Title  Pt will decrease falls risk as indicated by increase in BERG balance score to > or = 40/56    Baseline  34/56 when experiencing increased pain in RLE, 43/56 on 12/13/17    Status  Achieved      PT SHORT TERM GOAL #4  Title  Pt will ambulate >200' on indoor surfaces with cane (or no AD) and negotiate 4 stairs with one rail, alternating sequence with min A     Baseline  12/17/17: met stair portion of gaol only.    Status  Partially Met        PT Long Term Goals - 01/14/18 1622      PT LONG TERM GOAL #1   Title  Pt and husband will demonstrate independence with HEP    Baseline  01/14/18: limited performance due to not having assistance at home    Status  Partially Met      PT LONG TERM GOAL #2   Title  Pt will demonstrate improved balance and decreased falls risk as indicated by BERG balance score of > or = 45/56    Baseline  46/56    Status  Achieved      PT LONG TERM GOAL #3   Title  Pt will ambulate with cane (or no AD) x 250' over outdoor paved surfaces with min A; will negotiate 4 stairs with one rail with alternating sequence and supervision    Baseline  2/5/198: >300 feet over indoor level/outdoor paved surfaces combined with min to mod assist of 1 person (2cd person for safety) with no AD. needs 2 rails to do alternating sequence per pt report- declined to try in session due to fatigue    Time  --    Period  --    Status  Partially Met      PT LONG TERM GOAL #4   Title  Pt will report 15% improvement in Neuro QOL-LE    Baseline  01/14/18: 35.8% (improved form 31.3%, just not to goal)    Time  --    Period  --    Status  Partially Met      PT LONG TERM GOAL #5   Title  Pt will decrease falls risk during gait in home/community as indicated by increase in gait velocity to > or = 2.0 ft/sec with LRAD    Baseline  2.4 ft/sec    Status  Achieved      PT LONG TERM GOAL #6    Title  Pt will demonstrate decreased falls risk with gait as indicated by increase in DGI score to >/= 15/24 with LRAD    Baseline  15/24    Status  Achieved            Plan - 01/14/18 1622    Clinical Impression Statement  Today's skilled session checked remaining LTGs with primary PT planning to renew. Remainder of session continued to address LE/core strengthening/NMR. Pt is progressing and should benefit from continued PT to progress toward unmet goals.     Rehab Potential  Good    PT Frequency  2x / week    PT Duration  8 weeks    PT Treatment/Interventions  ADLs/Self Care Home Management;Aquatic Therapy;DME Instruction;Gait training;Stair training;Functional mobility training;Therapeutic activities;Therapeutic exercise;Balance training;Neuromuscular re-education;Patient/family education;Vestibular;Visual/perceptual remediation/compensation;Cognitive remediation;Orthotic Fit/Training    PT Next Visit Plan  Leave RW with family; walk to/from waiting area without AD.  Continue graded resistance training during gait - sports cord or therapist applied; corner balance with narrow BOS/head turns -finding midline    Consulted and Agree with Plan of Care  Patient;Family member/caregiver    Family Member Consulted  husband       Patient will benefit from skilled therapeutic intervention in order to improve the following deficits and impairments:  Abnormal gait, Decreased balance, Decreased cognition, Decreased coordination, Decreased strength, Difficulty walking, Dizziness, Impaired sensation, Impaired vision/preception, Pain, Decreased activity tolerance, Decreased mobility  Visit Diagnosis: 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 Weakness with dizziness-  since [facility] and CVA 04/07/17 08/07/2017  . Disturbances of vision, late effect of stroke 08/06/2017  . Health education/counseling 08/05/2017  . High risk medications (not anticoagulants) long-term use 08/05/2017  . Neuritis-  R sided:  arm and leg/ body due to stroke 08/05/2017  . Elevated LDL cholesterol level 07/11/2017  . [employer] (NIH) Stroke Scale limb ataxia score 2, ataxia present in two limbs 06/25/2017  . 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 Elevated vitamin B12 level 05/30/2017  . Vitamin D deficiency 05/29/2017  . History of tobacco abuse-  30pk yr hx - quit 04/07/17 05/21/2017  . Gait disturbance, post-stroke 05/14/2017  . Benign essential HTN   . Vitreous hemorrhage of right eye ([place])   . Adjustment disorder with mixed anxiety and depressed mood   . 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 s/p SAH (subarachnoid hemorrhage) ([place]) 04/19/2017  . 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 Hypoxia   . Subarachnoid hemorrhage due to ruptured aneurysm ([place]) 04/07/2017  . CVA (cerebral vascular accident) ([place]) 04/07/2017  . Elevated gastrin level 01/25/2012  . Hypokalemia 01/21/2012  . Nausea & vomiting 01/20/2012  . Epigastric pain 01/20/2012  . Duodenal ulcer, acute with obstruction 10/17/2011  . S/P laparoscopic cholecystectomy 10/16/2011    Willow Ora, PTA, [facility] [address], [place], [place] [address] [phone] 01/15/18, 10:12 AM   Name: [name] Barnett MRN: [id number] Date of Birth: 1976/05/07

## 2018-01-17 ENCOUNTER — Ambulatory Visit: Payer: BLUE CROSS/BLUE SHIELD | Admitting: Physical Therapy

## 2018-01-17 ENCOUNTER — Encounter: Payer: Self-pay | Admitting: Physical Therapy

## 2018-01-17 VITALS — BP 140/98

## 2018-01-17 NOTE — Therapy (Signed)
[facility] [address] [place], [place], [address] Phone: [phone]   Fax:  [phone]  Physical Therapy Treatment  Patient Details  Name: [name] Barnett MRN: [id number] Date of Birth: Jan 29, 1976 Referring Provider: Erick Colace, MD   Encounter Date: 01/17/2018  PT End of Session - 01/17/18 1622    Visit Number  46 no change as vist was arrived no charge due to medical issues    Number of Visits  62 per recertification    Date for PT Re-Evaluation  03/15/18 per recertification    Authorization Type  [employer]    PT Start Time  1618    PT Stop Time  1645 no charge due to medical issues    PT Time Calculation (min)  27 min    Equipment Utilized During Treatment  Gait belt    Activity Tolerance  Patient tolerated treatment well    Behavior During Therapy  [facility] for tasks assessed/performed       Past Medical History:  Diagnosis Date  . Duodenal obstruction   . GERD (gastroesophageal reflux disease)   . Hypertension   . Stroke [facility])    april 2018    Past Surgical History:  Procedure Laterality Date  . ABDOMINAL HYSTERECTOMY    . BALLOON DILATION  12/31/2011   Procedure: BALLOON DILATION;  Surgeon: Barrie Folk, MD;  Location: [facility] ENDOSCOPY;  Service: Endoscopy;  Laterality: N/A;  . CHOLECYSTECTOMY  07/28/11  . ESOPHAGOGASTRODUODENOSCOPY  10/17/2011   Procedure: ESOPHAGOGASTRODUODENOSCOPY (EGD);  Surgeon: Barrie Folk, MD;  Location: [facility] ENDOSCOPY;  Service: Endoscopy;  Laterality: N/A;  . IR ANGIO INTRA EXTRACRAN SEL INTERNAL CAROTID BILAT MOD SED  04/07/2017  . IR ANGIO INTRA EXTRACRAN SEL INTERNAL CAROTID BILAT MOD SED  01/07/2018  . IR ANGIO VERTEBRAL SEL VERTEBRAL UNI R MOD SED  04/07/2017  . IR ANGIO VERTEBRAL SEL VERTEBRAL UNI R MOD SED  01/07/2018  . IR ANGIOGRAM FOLLOW UP STUDY  04/07/2017  . IR ANGIOGRAM FOLLOW UP STUDY  04/07/2017  . IR ANGIOGRAM FOLLOW UP STUDY  04/07/2017  . IR ANGIOGRAM FOLLOW UP STUDY   04/07/2017  . IR ANGIOGRAM FOLLOW UP STUDY  04/07/2017  . IR ANGIOGRAM SELECTIVE EACH ADDITIONAL VESSEL  04/07/2017  . IR TRANSCATH/EMBOLIZ  04/07/2017  . IR US GUIDE VASC ACCESS RIGHT  01/07/2018  . PARS PLANA VITRECTOMY Right 05/01/2017   Procedure: PARS PLANA VITRECTOMY WITH 25 GAUGE RIGHT EYE, endolaser photocoaglation;  Surgeon: Carmela Rima, MD;  Location[facility] OR;  Service: Ophthalmology;  Laterality: Right;  . RADIOLOGY WITH ANESTHESIA N/A 04/07/2017   Procedure: RADIOLOGY WITH ANESTHESIA;  Surgeon: Lisbeth Renshaw, MD;  Location: [facility] OR;  Service: Radiology;  Laterality: N/A;  . REPLACEMENT TOTAL KNEE BILATERAL  2004  . TEMPOROMANDIBULAR JOINT SURGERY     2 surgeries  . TUMOR REMOVAL    . VAGOTOMY  01/23/2012   Procedure: VAGOTOMY, antrectomy and BII;  Surgeon: Currie Paris, MD;  Location: [facility] OR;  Service: General;  Laterality: N/A;  Laparotomy with vagotomy.    Vitals:   01/17/18 1618 01/17/18 1620 01/17/18 1625 01/17/18 1630  BP: (!) 140/112 (!) 140/100 (!) 142/98 (!) 140/98    Subjective Assessment - 01/17/18 1620    Subjective  No new complaints. No falls or pain to report. Does report the 2-4 toes on the right are "prickling me like needles".  Otherwise no pain.     Patient is accompained by:  Family member  Limitations  Standing;Walking    Currently in Pain?  Yes    Pain Score  10-Worst pain ever    Pain Location  Toe (Comment which one) 2-4 toes    Pain Orientation  Right    Pain Type  Acute pain    Pain Onset  More than a month ago    Pain Frequency  Constant    Aggravating Factors   unknown    Pain Relieving Factors  unknown            [facility] Adult PT Treatment/Exercise - 01/17/18 1624      Transfers   Transfers  Sit to Stand;Stand to Sit    Sit to Stand  4: Min guard;With upper extremity assist;From bed;From chair/3-in-1    Sit to Stand Details  Verbal cues for sequencing;Verbal cues for gait pattern;Verbal cues for safe use of DME/AE    Stand to Sit   4: Min guard;With upper extremity assist;To bed;To chair/3-in-1    Stand to Sit Details (indicate cue type and reason)  Verbal cues for precautions/safety;Verbal cues for safe use of DME/AE      Ambulation/Gait   Ambulation/Gait  Yes    Ambulation/Gait Assistance  4: Min assist;3: Mod assist    Ambulation/Gait Assistance Details  from lobby to gym area    Ambulation Distance (Feet)  100 Feet    Assistive device  None    Gait Pattern  Step-through pattern;Decreased step length - right;Decreased step length - left;Decreased stance time - left;Decreased stride length;Decreased hip/knee flexion - left;Decreased weight shift to left;Ataxic;Decreased trunk rotation    Ambulation Surface  Level;Indoor           PT Short Term Goals - 01/15/18 1239      PT SHORT TERM GOAL #1   Title  Pt will improve DGI to >/= 17/24 without AD    Baseline  15/24    Time  4    Period  Weeks    Status  Revised    Target Date  02/13/18      PT SHORT TERM GOAL #2   Title  Pt will decrease falls risk with community gait as indicated by increase in gait velocity with LRAD to > or = 2.6 ft/sec    Baseline  2.4 ft/sec    Time  4    Period  Weeks    Status  Revised    Target Date  02/13/18      PT SHORT TERM GOAL #3   Title  Pt will decrease falls risk as indicated by increase in BERG balance score to > or = 50/56    Baseline  46/56    Time  4    Period  Weeks    Status  Revised    Target Date  02/13/18      PT SHORT TERM GOAL #4   Title  Pt will ambulate >230' on indoor surfaces without AD with supervision    Time  4    Period  Weeks    Status  Revised    Target Date  02/13/18        PT Long Term Goals - 01/15/18 1243      PT LONG TERM GOAL #1   Title  Pt and husband will demonstrate independence with HEP, including use of treadmill    Time  8    Period  Weeks    Status  Revised    Target Date  03/15/18  PT LONG TERM GOAL #2   Title  Pt will demonstrate improved balance and  decreased falls risk as indicated by BERG balance score of > or = 52/56    Time  8    Period  Weeks    Status  Revised    Target Date  03/15/18      PT LONG TERM GOAL #3   Title  Pt will ambulate without AD x 250' over outdoor paved surfaces with supervision; will negotiate 4 stairs with one rail with alternating sequence and supervision    Time  8    Period  Weeks    Status  Revised    Target Date  03/15/18      PT LONG TERM GOAL #4   Title  Pt will report improvement in Neuro QOL-LE to >/= 40%    Baseline  35.8%    Time  8    Period  Weeks    Status  Revised    Target Date  03/15/18      PT LONG TERM GOAL #5   Title  Pt will decrease falls risk during gait in home/community as indicated by increase in gait velocity to > or = 3.0 ft/sec    Time  8    Period  Weeks    Status  Revised    Target Date  03/15/18      PT LONG TERM GOAL #6   Title  Pt will demonstrate decreased falls risk with gait as indicated by increase in DGI score to >/= 19/24 without AD    Time  8    Period  Weeks    Status  Revised    Target Date  03/15/18            Plan - 01/17/18 1623    Clinical Impression Statement  Session limited by elevated BP today. With seated rest and water pt's BP decreased some, just not to parameters safe for PT today. Pt's mom/daughter present and aware of vitals from today. All are aware to montior pt for other symptoms which at this time pt denies having any. Pt should benefit from continued PT to progress toward unmet goals.                                   Rehab Potential  Good    PT Frequency  2x / week    PT Duration  8 weeks    PT Treatment/Interventions  ADLs/Self Care Home Management;Aquatic Therapy;DME Instruction;Gait training;Stair training;Functional mobility training;Therapeutic activities;Therapeutic exercise;Balance training;Neuromuscular re-education;Patient/family education;Vestibular;Visual/perceptual remediation/compensation;Cognitive  remediation;Orthotic Fit/Training    PT Next Visit Plan  monitor BP; Leave RW with family; walk to/from waiting area without AD.  Continue graded resistance training during gait - sports cord or therapist applied; corner balance with narrow BOS/head turns -finding midline    Consulted and Agree with Plan of Care  Patient;Family member/caregiver    Family Member Consulted  husband       Patient will benefit from skilled therapeutic intervention in order to improve the following deficits and impairments:  Abnormal gait, Decreased balance, Decreased cognition, Decreased coordination, Decreased strength, Difficulty walking, Dizziness, Impaired sensation, Impaired vision/preception, Pain, Decreased activity tolerance, Decreased mobility  Visit Diagnosis: 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 Weakness with dizziness-  since [facility] and CVA 04/07/17 08/07/2017  . Disturbances of vision, late effect of stroke 08/06/2017  . Health education/counseling 08/05/2017  . High risk medications (not anticoagulants) long-term use 08/05/2017  . Neuritis-  R sided:  arm and leg/ body due to stroke 08/05/2017  . Elevated LDL cholesterol level 07/11/2017  . [employer] (NIH) Stroke Scale limb ataxia score 2, ataxia present in two limbs 06/25/2017  . 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 Elevated vitamin B12 level 05/30/2017  . Vitamin D deficiency 05/29/2017  . History of tobacco abuse-  30pk yr hx - quit 04/07/17 05/21/2017  . Gait disturbance, post-stroke 05/14/2017  . Benign essential HTN   . Vitreous hemorrhage of right eye ([name])   . Adjustment disorder with mixed anxiety and depressed mood   . 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 Terson  syndrome of both eyes ([name]) 04/23/2017  . s/p SAH (subarachnoid hemorrhage) ([name]) 04/19/2017  . Basilar artery aneurysm ([name])   . Hypoxia   . 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 CVA (cerebral vascular accident) ([name]) 04/07/2017  . Elevated gastrin level 01/25/2012  . Hypokalemia 01/21/2012  . Nausea & vomiting 01/20/2012  . Epigastric pain 01/20/2012  . Duodenal ulcer, acute with obstruction 10/17/2011  . S/P laparoscopic cholecystectomy 10/16/2011   [name], PTA, [facility] [address] [place], [place] [address] [phone] 01/17/18, 11:42 PM  Name: LEONETTE TISCHER MRN: [id number] Date of Birth: [birth year]

## 2018-01-22 ENCOUNTER — Ambulatory Visit: Payer: BLUE CROSS/BLUE SHIELD | Admitting: Physical Therapy

## 2018-01-22 ENCOUNTER — Encounter: Payer: BLUE CROSS/BLUE SHIELD | Admitting: Occupational Therapy

## 2018-01-28 ENCOUNTER — Encounter: Payer: BLUE CROSS/BLUE SHIELD | Admitting: Occupational Therapy

## 2018-01-28 ENCOUNTER — Ambulatory Visit: Payer: BLUE CROSS/BLUE SHIELD | Admitting: Physical Therapy

## 2018-01-30 ENCOUNTER — Encounter: Payer: Self-pay | Admitting: Physical Medicine & Rehabilitation

## 2018-01-30 ENCOUNTER — Ambulatory Visit: Payer: Self-pay | Admitting: Physical Therapy

## 2018-01-30 ENCOUNTER — Ambulatory Visit (HOSPITAL_BASED_OUTPATIENT_CLINIC_OR_DEPARTMENT_OTHER): Payer: BLUE CROSS/BLUE SHIELD | Admitting: Physical Medicine & Rehabilitation

## 2018-01-30 ENCOUNTER — Encounter: Payer: Self-pay | Admitting: Occupational Therapy

## 2018-01-30 ENCOUNTER — Encounter: Payer: BLUE CROSS/BLUE SHIELD | Attending: Physical Medicine & Rehabilitation

## 2018-01-30 VITALS — BP 104/74 | HR 106

## 2018-01-30 DIAGNOSIS — I69093 Ataxia following nontraumatic subarachnoid hemorrhage: Secondary | ICD-10-CM | POA: Diagnosis present

## 2018-01-30 DIAGNOSIS — R269 Unspecified abnormalities of gait and mobility: Secondary | ICD-10-CM

## 2018-01-30 DIAGNOSIS — I69398 Other sequelae of cerebral infarction: Secondary | ICD-10-CM | POA: Diagnosis not present

## 2018-01-30 DIAGNOSIS — R209 Unspecified disturbances of skin sensation: Secondary | ICD-10-CM

## 2018-01-30 DIAGNOSIS — R42 Dizziness and giddiness: Secondary | ICD-10-CM | POA: Insufficient documentation

## 2018-01-30 DIAGNOSIS — I725 Aneurysm of other precerebral arteries: Secondary | ICD-10-CM | POA: Diagnosis present

## 2018-01-30 DIAGNOSIS — I69393 Ataxia following cerebral infarction: Secondary | ICD-10-CM

## 2018-01-30 DIAGNOSIS — G5720 Lesion of femoral nerve, unspecified lower limb: Secondary | ICD-10-CM | POA: Diagnosis not present

## 2018-01-30 NOTE — Patient Instructions (Signed)
No driving °

## 2018-01-30 NOTE — Progress Notes (Signed)
Subjective:    Patient ID: [name] Barnett, female    DOB: 11/06/1976, 42 y.o.   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  Also has right hemisensory deficits including pain and temperature HPI Patient had 5 hours of relief after right femoral nerve block in the pain that occurs in her right thigh.   The patient has had 2 falls recently once she had periorbital ecchymosis and a second time she hit her foot on furniture and then fell.  Her right second third and fourth toes initially's became swollen and ecchymotic but this has resolved.  She still has some pain in the foot but this actually improves with weightbearing. Pain Inventory Average Pain 5 Pain Right Now 5 My pain is sharp and burning  In the last 24 hours, has pain interfered with the following? General activity 1 Relation with others 1 Enjoyment of life 6 What TIME of day is your pain at its worst? morning Sleep (in general) Good  Pain is worse with: walking, bending, sitting, inactivity, standing, unsure and some activites Pain improves with: rest Relief from Meds: 2  Mobility use a walker ability to climb steps?  yes do you drive?  no  Function not employed: date last employed .  Neuro/Psych bladder control problems  Prior Studies Any changes since last visit?  no  Physicians involved in your care Any changes since last visit?  no   Family History  Problem Relation Age of Onset  . Hypertension Mother   . Restless legs syndrome Mother   . Hyperlipidemia Mother   . Heart disease Father   . Malignant hyperthermia Neg Hx    Social History   Socioeconomic History  . Marital status: Married    Spouse name: Not on file  . Number of children: Not on file  . Years of education: Not on file  . Highest education level: Not on file  Social Needs  . Financial  resource strain: Not on file  . Food insecurity - worry: Not on file  . Food insecurity - inability: Not on file  . Transportation needs - medical: Not on file  . Transportation needs - non-medical: Not on file  Occupational History  . Not on file  Tobacco Use  . Smoking status: Former Smoker    Packs/day: 1.00    Years: 20.00    Pack years: 20.00    Types: Cigarettes    Last attempt to quit: 04/07/2017    Years since quitting: 0.8  . Smokeless tobacco: Never Used  Substance and Sexual Activity  . Alcohol use: No  . Drug use: No  . Sexual activity: Not Currently    Partners: Male  Other Topics Concern  . Not on file  Social History Narrative   [place] 2 [place]. 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   Past Surgical History:  Procedure Laterality Date  . ABDOMINAL HYSTERECTOMY    . BALLOON DILATION  12/31/2011   Procedure: BALLOON DILATION;  Surgeon: Barrie FolkJohn C Hayes, MD;  [facility];  Service: Endoscopy;  Laterality: N/A;  . CHOLECYSTECTOMY  07/28/11  . ESOPHAGOGASTRODUODENOSCOPY  10/17/2011   Procedure: ESOPHAGOGASTRODUODENOSCOPY (EGD);  Surgeon: Barrie FolkJohn C Hayes, MD;  [facility];  Service: Endoscopy;  Laterality: N/A;  . IR ANGIO INTRA EXTRACRAN SEL INTERNAL CAROTID BILAT MOD SED  04/07/2017  . IR ANGIO  INTRA EXTRACRAN SEL INTERNAL CAROTID BILAT MOD SED  01/07/2018  . IR ANGIO VERTEBRAL SEL VERTEBRAL UNI R MOD SED  04/07/2017  . IR ANGIO VERTEBRAL SEL VERTEBRAL UNI R MOD SED  01/07/2018  . IR ANGIOGRAM FOLLOW UP STUDY  04/07/2017  . IR ANGIOGRAM FOLLOW UP STUDY  04/07/2017  . IR ANGIOGRAM FOLLOW UP STUDY  04/07/2017  . IR ANGIOGRAM FOLLOW UP STUDY  04/07/2017  . IR ANGIOGRAM FOLLOW UP STUDY  04/07/2017  . IR ANGIOGRAM SELECTIVE EACH ADDITIONAL VESSEL  04/07/2017  . IR TRANSCATH/EMBOLIZ  04/07/2017  . IR US GUIDE VASC ACCESS RIGHT  01/07/2018  . PARS PLANA VITRECTOMY Right 05/01/2017   Procedure: PARS PLANA VITRECTOMY WITH 25 GAUGE RIGHT EYE, endolaser  photocoaglation;  Surgeon: Carmela Rima, MD;  Location: [facility] OR;  Service: Ophthalmology;  Laterality: Right;  . RADIOLOGY WITH ANESTHESIA N/A 04/07/2017   Procedure: RADIOLOGY WITH ANESTHESIA;  Surgeon: Lisbeth Renshaw, MD;  Location: [facility] OR;  Service: Radiology;  Laterality: N/A;  . REPLACEMENT TOTAL KNEE BILATERAL  2004  . TEMPOROMANDIBULAR JOINT SURGERY     2 surgeries  . TUMOR REMOVAL    . VAGOTOMY  01/23/2012   Procedure: VAGOTOMY, antrectomy and BII;  Surgeon: Currie Paris, MD;  Location: [facility] OR;  Service: General;  Laterality: N/A;  Laparotomy with vagotomy.   Past Medical History:  Diagnosis Date  . Duodenal obstruction   . GERD (gastroesophageal reflux disease)   . Hypertension   . Stroke [facility])    april 2018   There were no vitals taken for this visit.  Opioid Risk Score:   Fall Risk Score:  `1  Depression screen PHQ 2/9  Depression screen [facility] 2/9 01/13/2018 08/01/2017 07/11/2017 05/14/2017  Decreased Interest 1 0 2 3  Down, Depressed, Hopeless 0 0 1 2  PHQ - 2 Score 1 0 3 5  Altered sleeping 0 0 0 3  Tired, decreased energy 3 1 3 3   Change in appetite 0 0 0 1  Feeling bad or failure about yourself  0 0 0 1  Trouble concentrating 0 0 1 2  Moving slowly or fidgety/restless 0 0 3 2  Suicidal thoughts 0 0 1 0  PHQ-9 Score 4 1 11 17   Difficult doing work/chores Somewhat difficult - - Very difficult     Review of Systems  Constitutional: Negative.   HENT: Negative.   Eyes: Negative.   Respiratory: Negative.   Cardiovascular: Negative.   Gastrointestinal: Negative.   Endocrine: Negative.   Genitourinary: Negative.   Musculoskeletal: Positive for arthralgias, gait problem, joint swelling and myalgias.  Skin: Negative.   Allergic/Immunologic: Negative.   Hematological: Negative.   Psychiatric/Behavioral: Negative.   All other systems reviewed and are negative.      Objective:   Physical Exam  Constitutional: She is oriented to person, place, and time. She  appears well-developed and well-nourished.  HENT:  Head: Normocephalic and atraumatic.   resolving left periorbital ecchymosis  Eyes: Conjunctivae are normal. Pupils are equal, round, and reactive to light.  Neurological: She is alert and oriented to person, place, and time. Gait abnormal.  Decreased sensation right upper and right lower limb. No hypersensitivity to touch over the right anterior thigh There is tenderness palpation over the right femoral artery area at the inguinal crease.  No evidence of swelling no thrill  Motor strength is 4/5 bilateral deltoid bicep tricep grip hip flexion knee extension ankle dorsiflexion Gait has bilateral Trendelenburg type gait with evidence of truncal  ataxia Evidence of toe drag or knee instability.  There is mild dysmetria left finger-nose-finger.  She uses bilateral upper extremities to tie her shoes.   Psychiatric: She has a normal mood and affect.  Nursing note and vitals reviewed.         Assessment & Plan:  1.  Left hemiataxia as well as right hemisensory sits following basilar artery aneurysm rupture. In the outpatient PT OT  2.  Right femoral neuritis.  This still may improve symptomatically.  Continue gabapentin 600 mg 4 times daily, patient does not tolerate Lyrica will discontinue. We discussed other potential treatments given that there is a large motor component with the femoral nerve would not advise neural lysis.  She may be a good candidate for percutaneous electrical nerve stimulation however she is not interested in doing this.  Physical medicine and rehabilitation follow-up in 8 weeks

## 2018-01-31 ENCOUNTER — Ambulatory Visit: Payer: BLUE CROSS/BLUE SHIELD | Admitting: Physical Therapy

## 2018-01-31 ENCOUNTER — Encounter: Payer: BLUE CROSS/BLUE SHIELD | Admitting: Occupational Therapy

## 2018-02-04 ENCOUNTER — Ambulatory Visit: Payer: BLUE CROSS/BLUE SHIELD | Admitting: Occupational Therapy

## 2018-02-04 ENCOUNTER — Encounter: Payer: Self-pay | Admitting: Occupational Therapy

## 2018-02-04 ENCOUNTER — Encounter: Payer: Self-pay | Admitting: Physical Therapy

## 2018-02-04 ENCOUNTER — Ambulatory Visit: Payer: BLUE CROSS/BLUE SHIELD | Admitting: Physical Therapy

## 2018-02-04 VITALS — BP 141/96

## 2018-02-04 VITALS — BP 112/89 | HR 109

## 2018-02-04 DIAGNOSIS — R260 Ataxic gait: Secondary | ICD-10-CM

## 2018-02-04 DIAGNOSIS — R414 Neurologic neglect syndrome: Secondary | ICD-10-CM

## 2018-02-04 DIAGNOSIS — R2681 Unsteadiness on feet: Secondary | ICD-10-CM | POA: Diagnosis not present

## 2018-02-04 DIAGNOSIS — R41840 Attention and concentration deficit: Secondary | ICD-10-CM

## 2018-02-04 DIAGNOSIS — R2689 Other abnormalities of gait and mobility: Secondary | ICD-10-CM

## 2018-02-04 DIAGNOSIS — R278 Other lack of coordination: Secondary | ICD-10-CM

## 2018-02-04 DIAGNOSIS — I69054 Hemiplegia and hemiparesis following nontraumatic subarachnoid hemorrhage affecting left non-dominant side: Secondary | ICD-10-CM

## 2018-02-04 DIAGNOSIS — R208 Other disturbances of skin sensation: Secondary | ICD-10-CM

## 2018-02-04 NOTE — Therapy (Signed)
[facility] [address] [place], [place], [address] Phone: [phone]   Fax:  [phone]  Occupational Therapy Treatment  Patient Details  Name: [name] Barnett MRN: [id number] Date of Birth: 03/26/1976 Referring Provider: Charlett Blake, MD   Encounter Date: 02/04/2018  OT End of Session - 02/04/18 1658    Visit Number  42    Number of Visits  50    Date for OT Re-Evaluation  02/13/18    OT Start Time  1448 2 units only BP issues    OT Stop Time  1530    OT Time Calculation (min)  42 min       Past Medical History:  Diagnosis Date  . Duodenal obstruction   . GERD (gastroesophageal reflux disease)   . Hypertension   . Stroke [facility])    april 2018    Past Surgical History:  Procedure Laterality Date  . ABDOMINAL HYSTERECTOMY    . BALLOON DILATION  12/31/2011   Procedure: BALLOON DILATION;  Surgeon: Missy Sabins, MD;  Location: [facility] ENDOSCOPY;  Service: Endoscopy;  Laterality: N/A;  . CHOLECYSTECTOMY  07/28/11  . ESOPHAGOGASTRODUODENOSCOPY  10/17/2011   Procedure: ESOPHAGOGASTRODUODENOSCOPY (EGD);  Surgeon: Missy Sabins, MD;  Location: [facility] ENDOSCOPY;  Service: Endoscopy;  Laterality: N/A;  . IR ANGIO INTRA EXTRACRAN SEL INTERNAL CAROTID BILAT MOD SED  04/07/2017  . IR ANGIO INTRA EXTRACRAN SEL INTERNAL CAROTID BILAT MOD SED  01/07/2018  . IR ANGIO VERTEBRAL SEL VERTEBRAL UNI R MOD SED  04/07/2017  . IR ANGIO VERTEBRAL SEL VERTEBRAL UNI R MOD SED  01/07/2018  . IR ANGIOGRAM FOLLOW UP STUDY  04/07/2017  . IR ANGIOGRAM FOLLOW UP STUDY  04/07/2017  . IR ANGIOGRAM FOLLOW UP STUDY  04/07/2017  . IR ANGIOGRAM FOLLOW UP STUDY  04/07/2017  . IR ANGIOGRAM FOLLOW UP STUDY  04/07/2017  . IR ANGIOGRAM SELECTIVE EACH ADDITIONAL VESSEL  04/07/2017  . IR TRANSCATH/EMBOLIZ  04/07/2017  . IR US GUIDE VASC ACCESS RIGHT  01/07/2018  . PARS PLANA VITRECTOMY Right 05/01/2017   Procedure: PARS PLANA VITRECTOMY WITH 25 GAUGE RIGHT EYE,  endolaser photocoaglation;  Surgeon: Jalene Mullet, MD;  Location: [facility];  Service: Ophthalmology;  Laterality: Right;  . RADIOLOGY WITH ANESTHESIA N/A 04/07/2017   Procedure: RADIOLOGY WITH ANESTHESIA;  Surgeon: Consuella Lose, MD;  Location: [facility];  Service: Radiology;  Laterality: N/A;  . REPLACEMENT TOTAL KNEE BILATERAL  2004  . TEMPOROMANDIBULAR JOINT SURGERY     2 surgeries  . TUMOR REMOVAL    . VAGOTOMY  01/23/2012   Procedure: VAGOTOMY, antrectomy and BII;  Surgeon: Haywood Lasso, MD;  Location: [facility];  Service: General;  Laterality: N/A;  Laparotomy with vagotomy.    Vitals:   02/04/18 1455 02/04/18 1505  BP: (!) 148/105 (!) 141/96    Subjective Assessment - 02/04/18 1759    Subjective   Pt reports fall and that she broke her nose    Pertinent History  Subarachnoid hemorrhage due to ruptured aneurysm, ataxia (L cerebellar); HTN, R side decr sensation, vertical diplopia/visual deficits, ataxia, Bilateral retinal hemorhage associated with SAH (with surgery on R eye in hospital and L approx 1 month ago)    Patient Stated Goals  be able to walk and use L arm    Currently in Pain?  No/denies           Treatment: Seated on balance disc lifting 5 kg ball in shoulder flexion then diagonals with  bilateral UE's and 2 lbs ball, close supervision, min facilitation for core stability/ UE strength Seated on balance disc functional reaching with LUE to place large pegs into pegboard, min-mod difficulty, min v.c                  OT Short Term Goals - 01/09/18 1710      OT SHORT TERM GOAL #2   Title  --      OT SHORT TERM GOAL #3   Title  --      OT SHORT TERM GOAL #4   Title  --      OT SHORT TERM GOAL #5   Title  --      OT SHORT TERM GOAL #6   Title  --      OT SHORT TERM GOAL #7   Title  Pt will complete functional task with LUE with no more than min cueing for impulsivity.--check STGs 12/14/16    Status  Achieved inconsistent.  01/09/18 met at  approx this level      OT SHORT TERM GOAL #8   Title  Pt will perform simple home maintenance task/snack prep in standing with set-up/supervision and no more than 1 v.c. for safety.    Status  Achieved      OT SHORT TERM GOAL  #9   TITLE  Pt will improve coordination for ADLs as shown by completing 9-hole peg test in less than 57mn 30sec.    Baseline  26m 55.44sec    Status  Achieved 2 mins 43 secs.  12/12/17:  met 1m80m22.85sec        OT Long Term Goals - 01/09/18 1508      OT LONG TERM GOAL #1   Title  Pt/caregiver will be independent with updated HEP.--check LTGs 01/13/18    Time  8    Period  Weeks    Status  Achieved 11/14/17:  needs further updates.  01/09/18:  met      OT LONG TERM GOAL #2   Title  Pt will be use LUE as nondominant assist at least 75% of the time for ADLs without cueing.    Time  8    Period  Weeks    Status  Achieved uses 75% x however needs prompting at times.  09/19/17:  met per pt  90%      OT LONG TERM GOAL #3   Title  Pt will perform toileting mod I (including transfer).    Time  8    Period  Weeks    Status  Achieved supervision.  09/19/17:  continues to need superivsion for mobility.  11/05/17  met      OT LONG TERM GOAL #4   Title  Pt will improve LUE coordination/control to improve score on box and blocks to at least 27 with LUE in 2 trials.--revised 11/14/17    Baseline  19 blocks    Time  8    Period  Weeks    Status  Not Met 18, 19 blocks.  09/19/17:  25 blocks.  11/14/17:  19 blocks/inconsistent with frustration level.  01/09/18:  not met, 19 blocks      OT LONG TERM GOAL #5   Title  Pt will perform simple home maintenance task/snack prep in standing mod I.--updated 11/14/17    Time  8    Period  Weeks    Status  Achieved Pt is not perfoming consistely, she has made a sandwich in seated.  11/14/17:  approximating, supervision/min A and cueing.  12/12/17:  met for simple, selected tasks (primarily static activities)      OT LONG TERM GOAL #6    Title  Pt will perform simple environmental scanning/navigation with mod I.--revised 11/14/17    Time  8    Period  Weeks    Status  Partially Met 11/05/17  min cueing and supervision for mobility, environmental scanning with good accuracy.  01/09/18  continues to need supervision for safety for ambulation      OT LONG TERM GOAL #7   Title  Pt will improve coordination for ADLs as shown by completing 9-hole peg test in less than 59mn.    Baseline  268m 5.44sec    Time  8    Period  Weeks    Status  Achieved 01/09/18:  37m48m22.85sec      OT LONG TERM GOAL #8   Title  Pt will improve coordination for ADLs as shown by completing 9-hole peg test in less than 60sec.    Time  5    Period  Weeks    Status  New      OT LONG TERM GOAL  #9   Baseline  Pt will be independent with transitional movements during IADLs with no cueing for safety/impulsivity.    Time  5    Period  Weeks    Status  New      OT LONG TERM GOAL  #10   TITLE  Pt will be able to reach to retrieve lightweight object from overhead shelf consistently in 9/10 trials.    Time  5    Period  Weeks    Status  New      OT LONG TERM GOAL  #11   TITLE  Pt will be able to attend to functional task for at least 66m43mithout requiring redirection.    Time  5    Period  Weeks    Status  New            Plan - 02/04/18 1659    Clinical Impression Statement  Pt has not been seen for approximately 2 weeks due to injury to her foot, and a fall with injuries to pt's nose. Pt can benefit from continued occupational therapy to address pt's safety and independence with ADLs/IADLs.    Rehab Potential  Good    Current Impairments/barriers affecting progress:  cognitive deficits, impulsivity    OT Frequency  2x / week    OT Duration  -- 5 weeks    OT Treatment/Interventions  Self-care/ADL training;Therapeutic exercise;DME and/or AE instruction;Therapeutic activities;Cognitive remediation/compensation;Visual/perceptual  remediation/compensation;Passive range of motion;Functional Mobility Training;Neuromuscular education;Cryotherapy;Paraffin;Energy conservation;Manual Therapy;Patient/family education;Ultrasound;Balance training;Moist Heat;Fluidtherapy    Plan  laundry or kitchen task at walker level, neuro re-ed    Consulted and Agree with Plan of Care  Patient;Family member/caregiver    Family Member Consulted  husband       Patient will benefit from skilled therapeutic intervention in order to improve the following deficits and impairments:  Decreased coordination, Decreased range of motion, Difficulty walking, Abnormal gait, Decreased safety awareness, Impaired sensation, Decreased knowledge of precautions, Decreased balance, Decreased knowledge of use of DME, Impaired UE functional use, Decreased cognition, Decreased mobility, Decreased strength, Impaired vision/preception, Impaired perceived functional ability  Visit Diagnosis: 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 Weakness with dizziness-  since [facility] and CVA 04/07/17 08/07/2017  . Disturbances of vision, late effect of stroke 08/06/2017  . Health education/counseling 08/05/2017  . High risk medications (not anticoagulants) long-term use 08/05/2017  . Neuritis-  R sided:  arm and leg/ body due to stroke 08/05/2017  . Elevated LDL cholesterol level 07/11/2017  . [employer] of [employer] (NIH) Stroke Scale limb ataxia score 2, ataxia present in two limbs 06/25/2017  . 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 Elevated vitamin B12 level 05/30/2017  . Vitamin D deficiency 05/29/2017  . History of tobacco abuse-  30pk  yr hx - quit 04/07/17 05/21/2017  . Gait disturbance, post-stroke 05/14/2017  . Benign essential HTN   . Vitreous hemorrhage of right eye ([employer])   . Adjustment disorder with mixed anxiety and depressed mood   . 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 s/p SAH (subarachnoid hemorrhage) ([place]) 04/19/2017  . 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 Hypoxia   . Subarachnoid hemorrhage due to ruptured aneurysm ([place]) 04/07/2017  . CVA (cerebral vascular accident) ([place]) 04/07/2017  . Elevated gastrin level 01/25/2012  . Hypokalemia 01/21/2012  . Nausea & vomiting 01/20/2012  . Epigastric pain 01/20/2012  . Duodenal ulcer, acute with obstruction 10/17/2011  . S/P laparoscopic cholecystectomy 10/16/2011    RINE,KATHRYN 02/04/2018, 6:00 PM  [facility] [address] [place], [place], [address] Phone: [phone]   Fax:  [phone]  Name: [name] Barnett MRN: [id number] Date of Birth: [birth year]

## 2018-02-05 NOTE — Therapy (Signed)
[facility] [address] [place], [place], [address] Phone: [phone]   Fax:  [phone]  Physical Therapy Treatment  Patient Details  Name: [name] Barnett MRN: [id number] Date of Birth: 21-Nov-1976 Referring Provider: Erick Colace, MD   Encounter Date: 02/04/2018  PT End of Session - 02/04/18 1548    Visit Number  47 no change as vist was arrived no charge due to medical issues    Number of Visits  62 per recertification    Date for PT Re-Evaluation  03/15/18 per recertification    Authorization Type  [employer]    PT Start Time  1532    PT Stop Time  1615    PT Time Calculation (min)  43 min    Equipment Utilized During Treatment  Gait belt    Activity Tolerance  Patient tolerated treatment well    Behavior During Therapy  [facility] for tasks assessed/performed       Past Medical History:  Diagnosis Date  . Duodenal obstruction   . GERD (gastroesophageal reflux disease)   . Hypertension   . Stroke [facility])    april 2018    Past Surgical History:  Procedure Laterality Date  . ABDOMINAL HYSTERECTOMY    . BALLOON DILATION  12/31/2011   Procedure: BALLOON DILATION;  Surgeon: Barrie Folk, MD;  Location: [facility] ENDOSCOPY;  Service: Endoscopy;  Laterality: N/A;  . CHOLECYSTECTOMY  07/28/11  . ESOPHAGOGASTRODUODENOSCOPY  10/17/2011   Procedure: ESOPHAGOGASTRODUODENOSCOPY (EGD);  Surgeon: Barrie Folk, MD;  Location: [facility] ENDOSCOPY;  Service: Endoscopy;  Laterality: N/A;  . IR ANGIO INTRA EXTRACRAN SEL INTERNAL CAROTID BILAT MOD SED  04/07/2017  . IR ANGIO INTRA EXTRACRAN SEL INTERNAL CAROTID BILAT MOD SED  01/07/2018  . IR ANGIO VERTEBRAL SEL VERTEBRAL UNI R MOD SED  04/07/2017  . IR ANGIO VERTEBRAL SEL VERTEBRAL UNI R MOD SED  01/07/2018  . IR ANGIOGRAM FOLLOW UP STUDY  04/07/2017  . IR ANGIOGRAM FOLLOW UP STUDY  04/07/2017  . IR ANGIOGRAM FOLLOW UP STUDY  04/07/2017  . IR ANGIOGRAM FOLLOW UP STUDY  04/07/2017  . IR ANGIOGRAM FOLLOW  UP STUDY  04/07/2017  . IR ANGIOGRAM SELECTIVE EACH ADDITIONAL VESSEL  04/07/2017  . IR TRANSCATH/EMBOLIZ  04/07/2017  . IR US GUIDE VASC ACCESS RIGHT  01/07/2018  . PARS PLANA VITRECTOMY Right 05/01/2017   Procedure: PARS PLANA VITRECTOMY WITH 25 GAUGE RIGHT EYE, endolaser photocoaglation;  Surgeon: Carmela Rima, MD;  Location: [facility] OR;  Service: Ophthalmology;  Laterality: Right;  . RADIOLOGY WITH ANESTHESIA N/A 04/07/2017   Procedure: RADIOLOGY WITH ANESTHESIA;  Surgeon: Lisbeth Renshaw, MD;  Location: [facility] OR;  Service: Radiology;  Laterality: N/A;  . REPLACEMENT TOTAL KNEE BILATERAL  2004  . TEMPOROMANDIBULAR JOINT SURGERY     2 surgeries  . TUMOR REMOVAL    . VAGOTOMY  01/23/2012   Procedure: VAGOTOMY, antrectomy and BII;  Surgeon: Currie Paris, MD;  Location: [facility] OR;  Service: General;  Laterality: N/A;  Laparotomy with vagotomy.    Vitals:   02/04/18 1540  BP: 112/89  Pulse: (!) 109    Subjective Assessment - 02/04/18 1540    Subjective  Had a fall on 01/22/18 in her bathroom. Getting up from toilet pt missed the RW and kept going forward. Hit her face/head on edge of tub. Got right up and sat on floor. Daughter provided ice pack. Reports nothing felt out of place or different (no increase in dizziness,vision issues). so did  not go to MD. Malvin Johns Kirstiens last week and he felt all was well after fall. Prior to the fall she was having issues with foot. Started the date of her last visit with PT that was cancelled due to BP. That Thursday night she had kicked the sofa and not reailized how hard she had did this. Once she got home that Friday after her session was cancelled due to BP and took her shoe off she noticed her 2-4 toes were black. Saw Kirsteins for this as well the following week after her cancelled visit, toes were broken. He cleared her to return to PT the second visist with him that also addressed the fall as well.     Patient is accompained by:  Family member    Limitations   Standing;Walking    Currently in Pain?  No/denies    Pain Score  0-No pain           [facility] Adult PT Treatment/Exercise - 02/04/18 1551      Transfers   Transfers  Sit to Stand;Stand to Sit;Floor to Transfer    Sit to Stand  --    Sit to Stand Details  Verbal cues for sequencing;Verbal cues for gait pattern;Verbal cues for safe use of DME/AE    Sit to Stand Details (indicate cue type and reason)  cues for foot positioning and weight shifting with standing    Stand to Sit  4: Min guard;With upper extremity assist;To bed;To chair/3-in-1    Stand to Sit Details (indicate cue type and reason)  Verbal cues for precautions/safety;Verbal cues for safe use of DME/AE    Stand to Sit Details  cues to reach back and for hip/trunk flexion with sitting.     Floor to Transfer  4: Min assist    Floor to Transfer Details (indicate cue type and reason)  min assist for sitting on mat table to on red mat on floor and back up. cues on sequencing and technique     Comments  worked en bloc on sit<>stand transfers with emphasis on increased anterior weight shifting and to power up through both legs in attempt to decrease retropulsion with stainding.                                Ambulation/Gait   Ambulation/Gait  Yes    Ambulation/Gait Assistance  4: Min guard;4: Min assist;3: Mod assist    Ambulation/Gait Assistance Details  cues on posture, step length and weight shifting. one anterior loss of balance due to toe catching needing at least mod assist to prevent anterior fall.     Ambulation Distance (Feet)  115 Feet    Assistive device  None    Gait Pattern  Step-through pattern;Decreased step length - right;Decreased step length - left;Decreased stance time - left;Decreased stride length;Decreased hip/knee flexion - left;Decreased weight shift to left;Ataxic;Decreased trunk rotation    Ambulation Surface  Level;Indoor      Neuro Re-ed    Neuro Re-ed Details   for mm strengthening/balance/cooridination: on  red mat next to mat table- mini squats x 10 reps with equal LE weight bearing/emphasis on maintaining posture, side stepping left<>right with emphasis on staying tall, shifting weight and lifting off mat with stepping; fwd/bwd walking across length of mat with emphasis on tall posture, weight shifting and step length. min guard to min assist for balance with cues on ex form.technique needed with all tall kneeling  activities; in half kneeling with UE support on mat, progressing to no UE support on mat table- emphasis on weight shifting, pelvic positioning to assist with balance. progressed to alternating UE raises once balance progressed to only min assist needed (mod assist was needed initially). repeated this with the left foot foward.                                         PT Short Term Goals - 01/15/18 1239      PT SHORT TERM GOAL #1   Title  Pt will improve DGI to >/= 17/24 without AD    Baseline  15/24    Time  4    Period  Weeks    Status  Revised    Target Date  02/13/18      PT SHORT TERM GOAL #2   Title  Pt will decrease falls risk with community gait as indicated by increase in gait velocity with LRAD to > or = 2.6 ft/sec    Baseline  2.4 ft/sec    Time  4    Period  Weeks    Status  Revised    Target Date  02/13/18      PT SHORT TERM GOAL #3   Title  Pt will decrease falls risk as indicated by increase in BERG balance score to > or = 50/56    Baseline  46/56    Time  4    Period  Weeks    Status  Revised    Target Date  02/13/18      PT SHORT TERM GOAL #4   Title  Pt will ambulate >230' on indoor surfaces without AD with supervision    Time  4    Period  Weeks    Status  Revised    Target Date  02/13/18        PT Long Term Goals - 01/15/18 1243      PT LONG TERM GOAL #1   Title  Pt and husband will demonstrate independence with HEP, including use of treadmill    Time  8    Period  Weeks    Status  Revised    Target Date  03/15/18      PT LONG TERM  GOAL #2   Title  Pt will demonstrate improved balance and decreased falls risk as indicated by BERG balance score of > or = 52/56    Time  8    Period  Weeks    Status  Revised    Target Date  03/15/18      PT LONG TERM GOAL #3   Title  Pt will ambulate without AD x 250' over outdoor paved surfaces with supervision; will negotiate 4 stairs with one rail with alternating sequence and supervision    Time  8    Period  Weeks    Status  Revised    Target Date  03/15/18      PT LONG TERM GOAL #4   Title  Pt will report improvement in Neuro QOL-LE to >/= 40%    Baseline  35.8%    Time  8    Period  Weeks    Status  Revised    Target Date  03/15/18      PT LONG TERM GOAL #5   Title  Pt will decrease falls risk during gait  in home/community as indicated by increase in gait velocity to > or = 3.0 ft/sec    Time  8    Period  Weeks    Status  Revised    Target Date  03/15/18      PT LONG TERM GOAL #6   Title  Pt will demonstrate decreased falls risk with gait as indicated by increase in DGI score to >/= 19/24 without AD    Time  8    Period  Weeks    Status  Revised    Target Date  03/15/18            Plan - 02/04/18 1548    Rehab Potential  Good    PT Frequency  2x / week    PT Duration  8 weeks    PT Treatment/Interventions  ADLs/Self Care Home Management;Aquatic Therapy;DME Instruction;Gait training;Stair training;Functional mobility training;Therapeutic activities;Therapeutic exercise;Balance training;Neuromuscular re-education;Patient/family education;Vestibular;Visual/perceptual remediation/compensation;Cognitive remediation;Orthotic Fit/Training    PT Next Visit Plan  monitor BP; Leave RW with family; walk to/from waiting area without AD.  Continue graded resistance training during gait - sports cord or therapist applied; corner balance with narrow BOS/head turns -finding midline    Consulted and Agree with Plan of Care  Patient;Family member/caregiver    Family Member  Consulted  husband       Patient will benefit from skilled therapeutic intervention in order to improve the following deficits and impairments:  Abnormal gait, Decreased balance, Decreased cognition, Decreased coordination, Decreased strength, Difficulty walking, Dizziness, Impaired sensation, Impaired vision/preception, Pain, Decreased activity tolerance, Decreased mobility  Visit Diagnosis: 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 Weakness with dizziness-  since [facility] and CVA 04/07/17 08/07/2017  . Disturbances of vision, late effect of stroke 08/06/2017  . Health education/counseling 08/05/2017  . High risk medications (not anticoagulants) long-term use 08/05/2017  . Neuritis-  R sided:  arm and leg/ body due to stroke 08/05/2017  . Elevated LDL cholesterol level 07/11/2017  . [employer] of [employer] (NIH) Stroke Scale limb ataxia score 2, ataxia present in two limbs 06/25/2017  . 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 Elevated vitamin B12 level 05/30/2017  . Vitamin D deficiency 05/29/2017  . History of tobacco abuse-  30pk yr hx - quit 04/07/17 05/21/2017  . Gait disturbance, post-stroke 05/14/2017  . Benign essential HTN   . Vitreous hemorrhage of right eye ([name])   . Adjustment disorder with mixed anxiety and depressed mood   . 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 s/p SAH (subarachnoid hemorrhage) ([name]) 04/19/2017  . Basilar artery aneurysm ([name])   . Hypoxia   . 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 CVA (cerebral vascular accident) ([name]) 04/07/2017  . Elevated gastrin level 01/25/2012  .  Hypokalemia 01/21/2012  . Nausea & vomiting 01/20/2012  . Epigastric pain 01/20/2012  . Duodenal ulcer, acute with obstruction 10/17/2011  . S/P laparoscopic cholecystectomy 10/16/2011    [name], PTA, [facility] [address], [address] [place], [place] [address] [phone] 02/05/18, 11:17 PM   Name: REJOICE HEATWOLE MRN: [id number] Date of Birth: Sep 01, 1976

## 2018-02-06 ENCOUNTER — Ambulatory Visit: Payer: BLUE CROSS/BLUE SHIELD | Admitting: Occupational Therapy

## 2018-02-06 ENCOUNTER — Ambulatory Visit: Payer: BLUE CROSS/BLUE SHIELD | Admitting: Physical Therapy

## 2018-02-06 ENCOUNTER — Encounter: Payer: Self-pay | Admitting: Physical Therapy

## 2018-02-06 VITALS — BP 114/82

## 2018-02-06 VITALS — BP 141/86

## 2018-02-06 DIAGNOSIS — R208 Other disturbances of skin sensation: Secondary | ICD-10-CM

## 2018-02-06 DIAGNOSIS — R41840 Attention and concentration deficit: Secondary | ICD-10-CM

## 2018-02-06 DIAGNOSIS — I69054 Hemiplegia and hemiparesis following nontraumatic subarachnoid hemorrhage affecting left non-dominant side: Secondary | ICD-10-CM

## 2018-02-06 DIAGNOSIS — R2681 Unsteadiness on feet: Secondary | ICD-10-CM

## 2018-02-06 DIAGNOSIS — R42 Dizziness and giddiness: Secondary | ICD-10-CM

## 2018-02-06 DIAGNOSIS — R278 Other lack of coordination: Secondary | ICD-10-CM

## 2018-02-06 DIAGNOSIS — R2689 Other abnormalities of gait and mobility: Secondary | ICD-10-CM

## 2018-02-06 DIAGNOSIS — R414 Neurologic neglect syndrome: Secondary | ICD-10-CM

## 2018-02-06 NOTE — Therapy (Signed)
[facility] [address] [place], [place], [address] Phone: [phone]   Fax:  [phone]  Occupational Therapy Treatment  Patient Details  Name: [name] Barnett MRN: [id number] Date of Birth: [birth year] Referring Provider: Erick Colace, MD   Encounter Date: 02/06/2018  OT End of Session - 02/06/18 1641    Visit Number  43    Number of Visits  50    Date for OT Re-Evaluation  02/13/18 date extended to 03/07/18 due to missed missed visits/ scheduling issues    Authorization Type  [employer], no visit limit/auth req.    Authorization Time Period  renewal completed 11/14/17 for 8 wks.  renewal completed 01/09/18 for 5 weeks/10 visits    OT Start Time  1448    OT Stop Time  1530    OT Time Calculation (min)  42 min    Activity Tolerance  Patient tolerated treatment well    Behavior During Therapy  [facility] for tasks assessed/performed       Past Medical History:  Diagnosis Date  . Duodenal obstruction   . GERD (gastroesophageal reflux disease)   . Hypertension   . Stroke [facility])    april 2018    Past Surgical History:  Procedure Laterality Date  . ABDOMINAL HYSTERECTOMY    . BALLOON DILATION  12/31/2011   Procedure: BALLOON DILATION;  Surgeon: Barrie Folk, MD;  Location: [facility] ENDOSCOPY;  Service: Endoscopy;  Laterality: N/A;  . CHOLECYSTECTOMY  07/28/11  . ESOPHAGOGASTRODUODENOSCOPY  10/17/2011   Procedure: ESOPHAGOGASTRODUODENOSCOPY (EGD);  Surgeon: Barrie Folk, MD;  Location: [facility] ENDOSCOPY;  Service: Endoscopy;  Laterality: N/A;  . IR ANGIO INTRA EXTRACRAN SEL INTERNAL CAROTID BILAT MOD SED  04/07/2017  . IR ANGIO INTRA EXTRACRAN SEL INTERNAL CAROTID BILAT MOD SED  01/07/2018  . IR ANGIO VERTEBRAL SEL VERTEBRAL UNI R MOD SED  04/07/2017  . IR ANGIO VERTEBRAL SEL VERTEBRAL UNI R MOD SED  01/07/2018  . IR ANGIOGRAM FOLLOW UP STUDY  04/07/2017  . IR ANGIOGRAM FOLLOW UP STUDY  04/07/2017  . IR ANGIOGRAM FOLLOW UP STUDY  04/07/2017   . IR ANGIOGRAM FOLLOW UP STUDY  04/07/2017  . IR ANGIOGRAM FOLLOW UP STUDY  04/07/2017  . IR ANGIOGRAM SELECTIVE EACH ADDITIONAL VESSEL  04/07/2017  . IR TRANSCATH/EMBOLIZ  04/07/2017  . IR US GUIDE VASC ACCESS RIGHT  01/07/2018  . PARS PLANA VITRECTOMY Right 05/01/2017   Procedure: PARS PLANA VITRECTOMY WITH 25 GAUGE RIGHT EYE, endolaser photocoaglation;  Surgeon: Carmela Rima, MD;  [facility] OR;  Service: Ophthalmology;  Laterality: Right;  . RADIOLOGY WITH ANESTHESIA N/A 04/07/2017   Procedure: RADIOLOGY WITH ANESTHESIA;  Surgeon: Lisbeth Renshaw, MD;  Location: [facility] OR;  Service: Radiology;  Laterality: N/A;  . REPLACEMENT TOTAL KNEE BILATERAL  2004  . TEMPOROMANDIBULAR JOINT SURGERY     2 surgeries  . TUMOR REMOVAL    . VAGOTOMY  01/23/2012   Procedure: VAGOTOMY, antrectomy and BII;  Surgeon: Currie Paris, MD;  Location: [facility] OR;  Service: General;  Laterality: N/A;  Laparotomy with vagotomy.    Vitals:   02/06/18 1454  BP: (!) 141/86    Subjective Assessment - 02/06/18 1640    Pertinent History  Subarachnoid hemorrhage due to ruptured aneurysm, ataxia (L cerebellar); HTN, R side decr sensation, vertical diplopia/visual deficits, ataxia, Bilateral retinal hemorhage associated with SAH (with surgery on R eye in hospital and L approx 1 month ago)    Limitations  visual deficits, fall  risk, impulsivity/cognitive deficits    Patient Stated Goals  be able to walk and use L arm    Currently in Pain?  Yes    Pain Score  5     Pain Location  Head    Pain Orientation  Right    Pain Descriptors / Indicators  Headache    Pain Type  Acute pain    Pain Onset  Today    Pain Frequency  Constant    Aggravating Factors   woke up this am    Pain Relieving Factors  tramadol              Treatment: Standing to perform functional reaching with LUE at table supervision- minguard for balance, min-mod v.c  Seated placing various sized pegs into pegboard with LUE min-mod difficulty, min  v.c Discussion regarding exercises to perform at home and flowsheet provided, also discussion regarding importance of quitting smoking. Pt completed a 12 piece puzzle with min A in a reasonable amount of time v.c to use LUE.                  OT Long Term Goals - 02/06/18 1647                                                                             OT LONG TERM GOAL  #9   Baseline  Pt will be independent with transitional movements during IADLs with no cueing for safety/impulsivity.    Time  5    Period  Weeks    Status  New      OT LONG TERM GOAL  #10   TITLE  Pt will be able to reach to retrieve lightweight object from overhead shelf consistently in 9/10 trials.    Time  5    Period  Weeks    Status  New      OT LONG TERM GOAL  #11   TITLE  Pt will be able to attend to functional task for at least without requiring redirection.    Time  5    Period  Weeks    Status  New            Plan - 02/06/18 1643    Clinical Impression Statement  Pt is progressing slowly towards goals. She has not been performing HEP consistently. Pt has been provided with a flow sheet to encourage functional use of LUE daily.    Rehab Potential  Good    Current Impairments/barriers affecting progress:  cognitive deficits, impulsivity    OT Frequency  2x / week    OT Duration  -- 5 weeks, or 10 visits    OT Treatment/Interventions  Self-care/ADL training;Therapeutic exercise;DME and/or AE instruction;Therapeutic activities;Cognitive remediation/compensation;Visual/perceptual remediation/compensation;Passive range of motion;Functional Mobility Training;Neuromuscular education;Cryotherapy;Paraffin;Energy conservation;Manual Therapy;Patient/family education;Ultrasound;Balance training;Moist Heat;Fluidtherapy    Plan  functional activities at walker level, neuro re-ed    Consulted and Agree with Plan of Care  Family member/caregiver    Family Member Consulted   husband       Patient will benefit from skilled therapeutic intervention in order to improve the following deficits and impairments:  Decreased coordination, Decreased range of motion, Difficulty walking, Abnormal  gait, Decreased safety awareness, Impaired sensation, Decreased knowledge of precautions, Decreased balance, Decreased knowledge of use of DME, Impaired UE functional use, Decreased cognition, Decreased mobility, Decreased strength, Impaired vision/preception, Impaired perceived functional ability  Visit Diagnosis: 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 [facility]-  [facility] 08/07/2017  . Disturbances of vision, late effect of stroke 08/06/2017  . Health education/counseling 08/05/2017  . High risk medications (not anticoagulants) long-term use 08/05/2017  . Neuritis-  R sided:  arm and leg/ body due to stroke 08/05/2017  . Elevated LDL cholesterol level 07/11/2017  . [employer] (NIH) Stroke Scale limb ataxia score 2, ataxia present in two limbs 06/25/2017  . 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 Elevated vitamin B12 level 05/30/2017  . Vitamin D deficiency 05/29/2017  . History of tobacco abuse-  30pk yr hx - quit 04/07/17 05/21/2017  . Gait disturbance, post-stroke 05/14/2017  . Benign essential HTN   . Vitreous hemorrhage of right eye ([name])   . Adjustment disorder with mixed anxiety and depressed mood   . 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 s/p SAH (subarachnoid hemorrhage) ([name]) 04/19/2017  . Basilar artery aneurysm  ([name])   . Hypoxia   . 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 CVA (cerebral vascular accident) ([name]) 04/07/2017  . Elevated gastrin level 01/25/2012  . Hypokalemia 01/21/2012  . Nausea & vomiting 01/20/2012  . Epigastric pain 01/20/2012  . Duodenal ulcer, acute with obstruction 10/17/2011  . S/P laparoscopic cholecystectomy 10/16/2011    RINE,KATHRYN 02/06/2018, 5:15 PM  [facility] [address] [place], [place], [address] Phone: [phone]   Fax:  [phone]  Name: [name] Barnett MRN: [id number] Date of Birth: 04/29/1976

## 2018-02-06 NOTE — Patient Instructions (Signed)
(  Exercise) Monday Tuesday Wednesday Thursday Friday Saturday Sunday  Penny/ cup           [employer]           Connect 4           Working puzzle           [employer] cards           Pick up blocks, bb's           [employer]old laundry

## 2018-02-07 NOTE — Therapy (Signed)
[facility] [address] [place], [place], [address] Phone: [phone]   Fax:  [phone]  Physical Therapy Treatment  Patient Details  Name: [name] Barnett F Haraway MRN: [id number] Date of Birth: 11/30/1976 Referring Provider: Erick ColaceAndrew E Kirsteins, MD   Encounter Date: 02/06/2018  PT End of Session - 02/06/18 1540    Visit Number  48    Number of Visits  62 per recertification    Date for PT Re-Evaluation  03/15/18 per recertification    Authorization Type  BCBS    PT Start Time  1533    PT Stop Time  1615    PT Time Calculation (min)  42 min    Equipment Utilized During Treatment  Gait belt    Activity Tolerance  Patient tolerated treatment well    Behavior During Therapy  [facility] for tasks assessed/performed       Past Medical History:  Diagnosis Date  . Duodenal obstruction   . GERD (gastroesophageal reflux disease)   . Hypertension   . Stroke [facility]([name])    april 2018    Past Surgical History:  Procedure Laterality Date  . ABDOMINAL HYSTERECTOMY    . BALLOON DILATION  12/31/2011   Procedure: BALLOON DILATION;  Surgeon: Barrie FolkJohn C Hayes, MD;  [facility];  Service: Endoscopy;  Laterality: N/A;  . CHOLECYSTECTOMY  07/28/11  . ESOPHAGOGASTRODUODENOSCOPY  10/17/2011   Procedure: ESOPHAGOGASTRODUODENOSCOPY (EGD);  Surgeon: Barrie FolkJohn C Hayes, MD;  [facility];  Service: Endoscopy;  Laterality: N/A;  . IR ANGIO INTRA EXTRACRAN SEL INTERNAL CAROTID BILAT MOD SED  04/07/2017  . IR ANGIO INTRA EXTRACRAN SEL INTERNAL CAROTID BILAT MOD SED  01/07/2018  . IR ANGIO VERTEBRAL SEL VERTEBRAL UNI R MOD SED  04/07/2017  . IR ANGIO VERTEBRAL SEL VERTEBRAL UNI R MOD SED  01/07/2018  . IR ANGIOGRAM FOLLOW UP STUDY  04/07/2017  . IR ANGIOGRAM FOLLOW UP STUDY  04/07/2017  . IR ANGIOGRAM FOLLOW UP STUDY  04/07/2017  . IR ANGIOGRAM FOLLOW UP STUDY  04/07/2017  . IR ANGIOGRAM FOLLOW UP STUDY  04/07/2017  . IR ANGIOGRAM SELECTIVE EACH  ADDITIONAL VESSEL  04/07/2017  . IR TRANSCATH/EMBOLIZ  04/07/2017  . IR US GUIDE VASC ACCESS RIGHT  01/07/2018  . PARS PLANA VITRECTOMY Right 05/01/2017   Procedure: PARS PLANA VITRECTOMY WITH 25 GAUGE RIGHT EYE, endolaser photocoaglation;  Surgeon: Carmela RimaPatel, Narendra, MD;  [facility];  Service: Ophthalmology;  Laterality: Right;  . RADIOLOGY WITH ANESTHESIA N/A 04/07/2017   Procedure: RADIOLOGY WITH ANESTHESIA;  Surgeon: [name], MD;  [facility];  Service: Radiology;  Laterality: N/A;  . REPLACEMENT TOTAL KNEE BILATERAL  2004  . TEMPOROMANDIBULAR JOINT SURGERY     2 surgeries  . TUMOR REMOVAL    . VAGOTOMY  01/23/2012   Procedure: VAGOTOMY, antrectomy and BII;  Surgeon: [name], MD;  Location: [facility] OR;  Service: General;  Laterality: N/A;  Laparotomy with vagotomy.    Vitals:   02/06/18 1536  BP: 114/82    Subjective Assessment - 02/06/18 1536    Subjective  No new falls. Has a headache today. BP was okay with OT prior to this PT session. Was sore/stiff after last PT session, however still got on treadmill that evening.     Patient is accompained by:  Family member    Limitations  Standing;Walking    Currently in Pain?  Yes    Pain Score  5     Pain Location  Head    Pain Descriptors / Indicators  Headache    Pain Type  Acute pain    Pain Onset  Today    Pain Frequency  Constant    Aggravating Factors   woke up this am    Pain Relieving Factors  tramadol- did not help           [facility] Adult PT Treatment/Exercise - 02/06/18 1541      Transfers   Transfers  Sit to Stand;Stand to Sit    Sit to Stand  4: Min guard;With upper extremity assist;From chair/3-in-1;From bed    Stand to Sit  4: Min guard;With upper extremity assist;To bed;To chair/3-in-1      Ambulation/Gait   Ambulation/Gait  Yes    Ambulation/Gait Assistance  4: Min guard;4: Min assist;3: Mod assist    Ambulation/Gait Assistance Details  min guard with RW at start of session, min to mod  assist with no AD from mat to parallel bars and min assist with RW from gym to lobby. pt with increased ataxia and unsteadiness with gait today, possibly due to HA and mild dizziness she has today. continues to need cues for posture, to slow down for safety. pt continues to go forward with balance loss, needing assistance to prevent fall.     Ambulation Distance (Feet)  80 Feet x1, 110 x1 with RW, 25 x1 no AD    Assistive device  Rolling walker;None    Gait Pattern  Step-through pattern;Decreased step length - right;Decreased step length - left;Decreased stance time - left;Decreased stride length;Decreased hip/knee flexion - left;Decreased weight shift to left;Ataxic;Decreased trunk rotation    Ambulation Surface  Level;Indoor      High Level Balance   High Level Balance Activities  Marching forwards;Backward walking;Side stepping    High Level Balance Comments  in parallel bars for safety: 4-6 laps each with cues on posture, ex form, weight shifting, to slow down. all with min guard to min assist for balance, mirror feedback to assist with posture, step placement with activity. occasional touch to bars for balance recovery needed. with resisted gait pt needed cues on posture and step length to assist with balance.                         Balance Exercises - 02/06/18 1558      Balance Exercises: Standing   Standing Eyes Opened  Narrow base of support (BOS);Wide (BOA);Foam/compliant surface;Limitations    Standing Eyes Closed  Wide (BOA);Head turns;Foam/compliant surface;30 secs;Limitations    Rockerboard  Anterior/posterior;Lateral;EO    Balance Beam  standing across red beam with 2 foam bubbles in front of her: alternating fwd toe taps, alternating cross toe taps wtih minimal UE support on bars, min guard to min assist for balance with cues on posture, stance position and weight shifting for balance assistance.                             Balance Exercises: Standing   Standing Eyes Opened  Limitations  on dense blue foam in single layer: in narrow base of support with EO progressing to EC no head movements, min guard to min assist for balance. cues on posture and weight shifting to assist with balance recovery.,      Standing Eyes Closed Limitations  on dense blue foam in single layer: wide base of support for EC no head movements, progressing to EC head  movments left<>right, up<>down, min to mod assist for balance. cues on posture and weight shifting to assist with balance.                                 Rebounder Limitations  both ways on small balance board: EO rocking board with emphasis on tall posture and weight shifitng. min to mod assist with occasional touch to bars for balance.           PT Short Term Goals - 01/15/18 1239      PT SHORT TERM GOAL #1   Title  Pt will improve DGI to >/= 17/24 without AD    Baseline  15/24    Time  4    Period  Weeks    Status  Revised    Target Date  02/13/18      PT SHORT TERM GOAL #2   Title  Pt will decrease falls risk with community gait as indicated by increase in gait velocity with LRAD to > or = 2.6 ft/sec    Baseline  2.4 ft/sec    Time  4    Period  Weeks    Status  Revised    Target Date  02/13/18      PT SHORT TERM GOAL #3   Title  Pt will decrease falls risk as indicated by increase in BERG balance score to > or = 50/56    Baseline  46/56    Time  4    Period  Weeks    Status  Revised    Target Date  02/13/18      PT SHORT TERM GOAL #4   Title  Pt will ambulate >230' on indoor surfaces without AD with supervision    Time  4    Period  Weeks    Status  Revised    Target Date  02/13/18        PT Long Term Goals - 01/15/18 1243      PT LONG TERM GOAL #1   Title  Pt and husband will demonstrate independence with HEP, including use of treadmill    Time  8    Period  Weeks    Status  Revised    Target Date  03/15/18      PT LONG TERM GOAL #2   Title  Pt will demonstrate improved balance and  decreased falls risk as indicated by BERG balance score of > or = 52/56    Time  8    Period  Weeks    Status  Revised    Target Date  03/15/18      PT LONG TERM GOAL #3   Title  Pt will ambulate without AD x 250' over outdoor paved surfaces with supervision; will negotiate 4 stairs with one rail with alternating sequence and supervision    Time  8    Period  Weeks    Status  Revised    Target Date  03/15/18      PT LONG TERM GOAL #4   Title  Pt will report improvement in Neuro QOL-LE to >/= 40%    Baseline  35.8%    Time  8    Period  Weeks    Status  Revised    Target Date  03/15/18      PT LONG TERM GOAL #5   Title  Pt will decrease falls risk during gait  in home/community as indicated by increase in gait velocity to > or = 3.0 ft/sec    Time  8    Period  Weeks    Status  Revised    Target Date  03/15/18      PT LONG TERM GOAL #6   Title  Pt will demonstrate decreased falls risk with gait as indicated by increase in DGI score to >/= 19/24 without AD    Time  8    Period  Weeks    Status  Revised    Target Date  03/15/18            Plan - 02/06/18 1540    Clinical Impression Statement  Today's skilled session continued to focus on dynamic and static balance activities with only fatigue reported. No increased in HA or dizziness with activities today. Pt did present with increased ataxia/unsteadiness with gait today vs previous sessions. Possibly due to her HA and dizziness. Pt should benefit from continued PT to progress toward unmet goals.    Rehab Potential  Good    PT Frequency  2x / week    PT Duration  8 weeks    PT Treatment/Interventions  ADLs/Self Care Home Management;Aquatic Therapy;DME Instruction;Gait training;Stair training;Functional mobility training;Therapeutic activities;Therapeutic exercise;Balance training;Neuromuscular re-education;Patient/family education;Vestibular;Visual/perceptual remediation/compensation;Cognitive remediation;Orthotic  Fit/Training    PT Next Visit Plan  monitor BP. continued with high level balance, core strengthening. 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 Weakness with dizziness-  since [facility] and CVA 04/07/17 08/07/2017  . Disturbances of vision, late effect of stroke 08/06/2017  . Health education/counseling 08/05/2017  . High risk medications (not anticoagulants) long-term use 08/05/2017  . Neuritis-  R sided:  arm and leg/ body due to stroke 08/05/2017  . Elevated LDL cholesterol level 07/11/2017  . [employer] (NIH) Stroke Scale limb ataxia score 2, ataxia present in two limbs 06/25/2017  . 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 Elevated vitamin B12 level 05/30/2017  . Vitamin D deficiency 05/29/2017  . History of tobacco abuse-  30pk yr hx - quit 04/07/17 05/21/2017  . Gait disturbance, post-stroke 05/14/2017  . Benign essential HTN   . Vitreous hemorrhage of right eye ([name])   . Adjustment disorder with mixed anxiety and depressed mood   . 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 s/p SAH (subarachnoid hemorrhage) ([name]) 04/19/2017  . Basilar artery aneurysm ([name])   . Hypoxia   . 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 CVA (cerebral vascular accident) ([name]) 04/07/2017  . Elevated gastrin level 01/25/2012  . Hypokalemia 01/21/2012  . Nausea & vomiting 01/20/2012  .  Epigastric pain 01/20/2012  . Duodenal ulcer, acute with obstruction 10/17/2011  . S/P laparoscopic cholecystectomy 10/16/2011    [name], PTA, [facility] [address], [address] [place], [place] [address] [phone] 02/07/18, 6:42 PM   Name: LAKYN ALSTEEN MRN: [id number] Date of Birth: 11/11/1976

## 2018-02-11 ENCOUNTER — Ambulatory Visit: Payer: BLUE CROSS/BLUE SHIELD | Attending: Physical Medicine & Rehabilitation | Admitting: Physical Therapy

## 2018-02-11 ENCOUNTER — Other Ambulatory Visit: Payer: BLUE CROSS/BLUE SHIELD

## 2018-02-11 VITALS — BP 136/92 | HR 76

## 2018-02-11 DIAGNOSIS — I69054 Hemiplegia and hemiparesis following nontraumatic subarachnoid hemorrhage affecting left non-dominant side: Secondary | ICD-10-CM | POA: Diagnosis not present

## 2018-02-11 DIAGNOSIS — R2681 Unsteadiness on feet: Secondary | ICD-10-CM | POA: Diagnosis present

## 2018-02-11 DIAGNOSIS — R41840 Attention and concentration deficit: Secondary | ICD-10-CM | POA: Diagnosis present

## 2018-02-11 DIAGNOSIS — R278 Other lack of coordination: Secondary | ICD-10-CM

## 2018-02-11 DIAGNOSIS — R208 Other disturbances of skin sensation: Secondary | ICD-10-CM

## 2018-02-11 DIAGNOSIS — R260 Ataxic gait: Secondary | ICD-10-CM | POA: Diagnosis present

## 2018-02-11 DIAGNOSIS — R41842 Visuospatial deficit: Secondary | ICD-10-CM | POA: Diagnosis present

## 2018-02-11 DIAGNOSIS — R2689 Other abnormalities of gait and mobility: Secondary | ICD-10-CM | POA: Diagnosis present

## 2018-02-11 DIAGNOSIS — R270 Ataxia, unspecified: Secondary | ICD-10-CM | POA: Diagnosis present

## 2018-02-11 DIAGNOSIS — R42 Dizziness and giddiness: Secondary | ICD-10-CM | POA: Diagnosis present

## 2018-02-11 NOTE — Therapy (Signed)
[facility] [address] [place], [place], [address] Phone: [phone]   Fax:  [phone]  Physical Therapy Treatment  Patient Details  Name: [name] Barnett MRN: [id number] Date of Birth: 10/09/1976 Referring Provider: Erick ColaceAndrew E Kirsteins, MD   Encounter Date: 02/11/2018  PT End of Session - 02/11/18 1651    Visit Number  49    Number of Visits  62    Date for PT Re-Evaluation  03/15/18    Authorization Type  BCBS    PT Start Time  1400    PT Stop Time  1445    PT Time Calculation (min)  45 min    Equipment Utilized During Treatment  Gait belt    Activity Tolerance  Patient tolerated treatment well    Behavior During Therapy  WFL for tasks assessed/performed       Past Medical History:  Diagnosis Date  . Duodenal obstruction   . GERD (gastroesophageal reflux disease)   . Hypertension   . Stroke [facility]([name])    april 2018    Past Surgical History:  Procedure Laterality Date  . ABDOMINAL HYSTERECTOMY    . BALLOON DILATION  12/31/2011   Procedure: BALLOON DILATION;  Surgeon: Barrie FolkJohn C Hayes, MD;  [facility];  Service: Endoscopy;  Laterality: N/A;  . CHOLECYSTECTOMY  07/28/11  . ESOPHAGOGASTRODUODENOSCOPY  10/17/2011   Procedure: ESOPHAGOGASTRODUODENOSCOPY (EGD);  Surgeon: Barrie FolkJohn C Hayes, MD;  [facility];  Service: Endoscopy;  Laterality: N/A;  . IR ANGIO INTRA EXTRACRAN SEL INTERNAL CAROTID BILAT MOD SED  04/07/2017  . IR ANGIO INTRA EXTRACRAN SEL INTERNAL CAROTID BILAT MOD SED  01/07/2018  . IR ANGIO VERTEBRAL SEL VERTEBRAL UNI R MOD SED  04/07/2017  . IR ANGIO VERTEBRAL SEL VERTEBRAL UNI R MOD SED  01/07/2018  . IR ANGIOGRAM FOLLOW UP STUDY  04/07/2017  . IR ANGIOGRAM FOLLOW UP STUDY  04/07/2017  . IR ANGIOGRAM FOLLOW UP STUDY  04/07/2017  . IR ANGIOGRAM FOLLOW UP STUDY  04/07/2017  . IR ANGIOGRAM FOLLOW UP STUDY  04/07/2017  . IR ANGIOGRAM SELECTIVE EACH ADDITIONAL VESSEL  04/07/2017  . IR  TRANSCATH/EMBOLIZ  04/07/2017  . IR US GUIDE VASC ACCESS RIGHT  01/07/2018  . PARS PLANA VITRECTOMY Right 05/01/2017   Procedure: PARS PLANA VITRECTOMY WITH 25 GAUGE RIGHT EYE, endolaser photocoaglation;  Surgeon: Carmela RimaPatel, Narendra, MD;  [facility];  Service: Ophthalmology;  Laterality: Right;  . RADIOLOGY WITH ANESTHESIA N/A 04/07/2017   Procedure: RADIOLOGY WITH ANESTHESIA;  Surgeon: [name], MD;  [facility];  Service: Radiology;  Laterality: N/A;  . REPLACEMENT TOTAL KNEE BILATERAL  2004  . TEMPOROMANDIBULAR JOINT SURGERY     2 surgeries  . TUMOR REMOVAL    . VAGOTOMY  01/23/2012   Procedure: VAGOTOMY, antrectomy and BII;  Surgeon: [name], MD;  Location: [facility] OR;  Service: General;  Laterality: N/A;  Laparotomy with vagotomy.    Vitals:   02/11/18 1409  BP: (!) 136/92  Pulse: 76    Subjective Assessment - 02/11/18 1406    Subjective  pt reports she was walking at home with her RW when it got caught on a rug and she lost her balance resulting in a fall. During which she hit her head.pt reports small scratch on head that has not been an issue.    Patient is accompained by:  Family member    Limitations  Standing;Walking    Currently in Pain?  No/denies  Pain Orientation  Anterior         [facility] PT Assessment - 02/11/18 1405      Berg Balance Test   Sit to Stand  Able to stand without using hands and stabilize independently    Standing Unsupported  Able to stand safely 2 minutes    Sitting with Back Unsupported but Feet Supported on Floor or Stool  Able to sit safely and securely 2 minutes    Stand to Sit  Sits safely with minimal use of hands    Transfers  Able to transfer safely, minor use of hands    Standing Unsupported with Eyes Closed  Able to stand 10 seconds safely    Standing Ubsupported with Feet Together  Able to place feet together independently and stand for 1 minute with supervision    From Standing, Reach Forward with Outstretched Arm   Can reach confidently >25 cm (10")    From Standing Position, Pick up Object from Floor  Able to pick up shoe safely and easily    From Standing Position, Turn to Look Behind Over each Shoulder  Looks behind one side only/other side shows less weight shift    Turn 360 Degrees  Needs close supervision or verbal cueing    Standing Unsupported, Alternately Place Feet on Step/Stool  Able to complete >2 steps/needs minimal assist    Standing Unsupported, One Foot in Front  Able to take small step independently and hold 30 seconds    Standing on One Leg  Tries to lift leg/unable to hold 3 seconds but remains standing independently    Total Score  43    Berg comment:  significant falls risk                  [facility] Adult PT Treatment/Exercise - 02/11/18 1405      Transfers   Transfers  Sit to Stand;Stand to Sit    Sit to Stand  4: Min guard    Sit to Stand Details  Verbal cues for technique;Verbal cues for precautions/safety;Verbal cues for gait pattern    Stand to Sit  4: Min guard;5: Supervision    Stand to Sit Details (indicate cue type and reason)  Verbal cues for technique;Verbal cues for precautions/safety;Verbal cues for sequencing      Ambulation/Gait   Ambulation/Gait  Yes    Ambulation/Gait Assistance  4: Min guard;4: Min assist    Ambulation/Gait Assistance Details  tactile cues for pelvic rotation & posterior depression in stance for stability.     Ambulation Distance (Feet)  125 Feet 3x125', 2x 65 with pt manual manipulation for hip stability     Assistive device  Rolling walker;None    Gait Pattern  Step-through pattern;Decreased step length - right;Decreased step length - left;Decreased stance time - left;Decreased stride length;Decreased hip/knee flexion - left;Decreased weight shift to left;Ataxic;Decreased trunk rotation    Ambulation Surface  Level;Indoor    Gait velocity  1.44ft/sec with RW and 2.04 without RW unsteady and MinA x2    Gait Comments  Pt demonstrated  improved ambulation balance with verbal cues to focus on single un moving point infront of her while she walks. pt also reponced positivly to manual manipulation of hips to reduce hip rotation and apply a posterior/inferior force during mid to terminal stance.              PT Education - 02/11/18 1650    Education provided  Yes    Education Details  falls  risk reduction. HEP exercises and how to safely conduct them. Modified single leg stance at counter with forefoot in lower cabinet with husband supervision    Person(s) Educated  Patient    Methods  Explanation;Demonstration;Verbal cues    Comprehension  Verbalized understanding;Returned demonstration       PT Short Term Goals - 02/11/18 1703      PT SHORT TERM GOAL #1   Title  Pt will improve DGI to >/= 17/24 without AD    Baseline  15/24    Time  4    Period  Weeks    Status  Revised      PT SHORT TERM GOAL #2   Title  Pt will decrease falls risk with community gait as indicated by increase in gait velocity with LRAD to > or = 2.6 ft/sec    Baseline  (3/5)1.65ft/sec with RW and 2.04 without     Time  4    Period  Weeks    Status  On-going      PT SHORT TERM GOAL #3   Title  Pt will decrease falls risk as indicated by increase in BERG balance score to > or = 50/56    Baseline  3/5 Berg 43/56    Time  4    Period  Weeks    Status  On-going      PT SHORT TERM GOAL #4   Title  Pt will ambulate >230' on indoor surfaces without AD with supervision    Time  4    Period  Weeks    Status  Revised        PT Long Term Goals - 01/15/18 1243      PT LONG TERM GOAL #1   Title  Pt and husband will demonstrate independence with HEP, including use of treadmill    Time  8    Period  Weeks    Status  Revised    Target Date  03/15/18      PT LONG TERM GOAL #2   Title  Pt will demonstrate improved balance and decreased falls risk as indicated by BERG balance score of > or = 52/56    Time  8    Period  Weeks    Status   Revised    Target Date  03/15/18      PT LONG TERM GOAL #3   Title  Pt will ambulate without AD x 250' over outdoor paved surfaces with supervision; will negotiate 4 stairs with one rail with alternating sequence and supervision    Time  8    Period  Weeks    Status  Revised    Target Date  03/15/18      PT LONG TERM GOAL #4   Title  Pt will report improvement in Neuro QOL-LE to >/= 40%    Baseline  35.8%    Time  8    Period  Weeks    Status  Revised    Target Date  03/15/18      PT LONG TERM GOAL #5   Title  Pt will decrease falls risk during gait in home/community as indicated by increase in gait velocity to > or = 3.0 ft/sec    Time  8    Period  Weeks    Status  Revised    Target Date  03/15/18      PT LONG TERM GOAL #6   Title  Pt will demonstrate decreased falls risk with  gait as indicated by increase in DGI score to >/= 19/24 without AD    Time  8    Period  Weeks    Status  Revised    Target Date  03/15/18            Plan - 02/11/18 1705    Clinical Impression Statement  During today's Pt session checked 2 of her short term goals (2 more STGs to be checked next session). Pt performed the Berg balance assessment with a score of 43/56 indicating that she remains at significant risk for falls. Pt was very apprehensive during static balance assessment tasks and became frustrated when she was unable to complete tasks as well has she had during past sessions. Pt required multiple rest breaks during session due to muscular fatigue. During gait training patient responded well to verbal cues of (ASIS being headlights and to keep them forward) with moderate manual manipulation of hips to assist with excessive hip rotation during gait. Pt and spouse educated on progression of patient balance HEP. Patient will continue to benefit from skilled PT intervention to address issues noted above    Rehab Potential  Good    PT Frequency  2x / week    PT Duration  8 weeks    PT  Treatment/Interventions  ADLs/Self Care Home Management;Aquatic Therapy;DME Instruction;Gait training;Stair training;Functional mobility training;Therapeutic activities;Therapeutic exercise;Balance training;Neuromuscular re-education;Patient/family education;Vestibular;Visual/perceptual remediation/compensation;Cognitive remediation;Orthotic Fit/Training    PT Next Visit Plan  continue to check 2 PT short term goals and continue to work on gait task and balance    Consulted and Agree with Plan of Care  Patient;Family member/caregiver    Family Member Consulted  husband       Patient will benefit from skilled therapeutic intervention in order to improve the following deficits and impairments:  Abnormal gait, Decreased balance, Decreased cognition, Decreased coordination, Decreased strength, Difficulty walking, Dizziness, Impaired sensation, Impaired vision/preception, Pain, Decreased activity tolerance, Decreased mobility  Visit Diagnosis: 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 Weakness with dizziness-  since [facility] and CVA 04/07/17 08/07/2017  . Disturbances of vision, late effect of stroke 08/06/2017  . Health education/counseling 08/05/2017  . High risk medications (not anticoagulants) long-term use 08/05/2017  . Neuritis-  R sided:  arm and leg/ body due to stroke 08/05/2017  . Elevated LDL cholesterol level 07/11/2017  . [employer] (NIH) Stroke Scale limb ataxia score 2, ataxia present in two limbs 06/25/2017  . 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 Elevated vitamin B12 level  05/30/2017  . Vitamin D deficiency 05/29/2017  . History of tobacco abuse-  30pk yr hx - quit 04/07/17 05/21/2017  . Gait disturbance, post-stroke 05/14/2017  . Benign essential HTN   . Vitreous hemorrhage of right eye ([name])   . Adjustment disorder with mixed anxiety and depressed mood   . 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 s/p SAH (subarachnoid hemorrhage) ([name]) 04/19/2017  . Basilar artery aneurysm ([name])   . Hypoxia   . 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 CVA (  cerebral vascular accident) ([name]) 04/07/2017  . Elevated gastrin level 01/25/2012  . Hypokalemia 01/21/2012  . Nausea & vomiting 01/20/2012  . Epigastric pain 01/20/2012  . Duodenal ulcer, acute with obstruction 10/17/2011  . S/P laparoscopic cholecystectomy 10/16/2011   Merton Border, SPT 02/11/2018, 4:15 PM  WALDRON,ROBIN PT, DPT 02/12/2018, 6:39 AM  [facility] [address] [place], [place], [address] Phone: [phone]   Fax:  [phone]  Name: [name] Barnett MRN: [id number] Date of Birth: 1975-12-25

## 2018-02-13 ENCOUNTER — Encounter: Payer: Self-pay | Admitting: Physical Therapy

## 2018-02-13 ENCOUNTER — Ambulatory Visit: Payer: BLUE CROSS/BLUE SHIELD | Admitting: Physical Therapy

## 2018-02-13 VITALS — BP 120/90

## 2018-02-13 DIAGNOSIS — R278 Other lack of coordination: Secondary | ICD-10-CM

## 2018-02-13 DIAGNOSIS — R2681 Unsteadiness on feet: Secondary | ICD-10-CM

## 2018-02-13 DIAGNOSIS — R2689 Other abnormalities of gait and mobility: Secondary | ICD-10-CM

## 2018-02-13 DIAGNOSIS — R260 Ataxic gait: Secondary | ICD-10-CM

## 2018-02-13 DIAGNOSIS — R42 Dizziness and giddiness: Secondary | ICD-10-CM

## 2018-02-13 DIAGNOSIS — I69054 Hemiplegia and hemiparesis following nontraumatic subarachnoid hemorrhage affecting left non-dominant side: Secondary | ICD-10-CM | POA: Diagnosis not present

## 2018-02-13 NOTE — Therapy (Signed)
[place] [address] [place], [place], [address] Phone: [phone]   Fax:  [phone]  Physical Therapy Treatment  Patient Details  Name: [name] Barnett MRN: [id number] Date of Birth: Jun 18, 1976 Referring Provider: Charlett Blake, MD   Encounter Date: 02/13/2018  PT End of Session - 02/13/18 2029    Visit Number  50    Number of Visits  74    Date for PT Re-Evaluation  03/15/18    Authorization Type  BCBS    PT Start Time  1444    PT Stop Time  1534    PT Time Calculation (min)  50 min    Equipment Utilized During Treatment  Gait belt    Activity Tolerance  Patient tolerated treatment well    Behavior During Therapy  WFL for tasks assessed/performed       Past Medical History:  Diagnosis Date  . Duodenal obstruction   . GERD (gastroesophageal reflux disease)   . Hypertension   . Stroke [facility])    april 2018    Past Surgical History:  Procedure Laterality Date  . ABDOMINAL HYSTERECTOMY    . BALLOON DILATION  12/31/2011   Procedure: BALLOON DILATION;  Surgeon: Missy Sabins, MD;  Location: [facility] ENDOSCOPY;  Service: Endoscopy;  Laterality: N/A;  . CHOLECYSTECTOMY  07/28/11  . ESOPHAGOGASTRODUODENOSCOPY  10/17/2011   Procedure: ESOPHAGOGASTRODUODENOSCOPY (EGD);  Surgeon: Missy Sabins, MD;  Location: [facility] ENDOSCOPY;  Service: Endoscopy;  Laterality: N/A;  . IR ANGIO INTRA EXTRACRAN SEL INTERNAL CAROTID BILAT MOD SED  04/07/2017  . IR ANGIO INTRA EXTRACRAN SEL INTERNAL CAROTID BILAT MOD SED  01/07/2018  . IR ANGIO VERTEBRAL SEL VERTEBRAL UNI R MOD SED  04/07/2017  . IR ANGIO VERTEBRAL SEL VERTEBRAL UNI R MOD SED  01/07/2018  . IR ANGIOGRAM FOLLOW UP STUDY  04/07/2017  . IR ANGIOGRAM FOLLOW UP STUDY  04/07/2017  . IR ANGIOGRAM FOLLOW UP STUDY  04/07/2017  . IR ANGIOGRAM FOLLOW UP STUDY  04/07/2017  . IR ANGIOGRAM FOLLOW UP STUDY  04/07/2017  . IR ANGIOGRAM SELECTIVE EACH ADDITIONAL VESSEL  04/07/2017  . IR  TRANSCATH/EMBOLIZ  04/07/2017  . IR US GUIDE VASC ACCESS RIGHT  01/07/2018  . PARS PLANA VITRECTOMY Right 05/01/2017   Procedure: PARS PLANA VITRECTOMY WITH 25 GAUGE RIGHT EYE, endolaser photocoaglation;  Surgeon: Jalene Mullet, MD;  Location: [place];  Service: Ophthalmology;  Laterality: Right;  . RADIOLOGY WITH ANESTHESIA N/A 04/07/2017   Procedure: RADIOLOGY WITH ANESTHESIA;  Surgeon: Consuella Lose, MD;  Location: [place];  Service: Radiology;  Laterality: N/A;  . REPLACEMENT TOTAL KNEE BILATERAL  2004  . TEMPOROMANDIBULAR JOINT SURGERY     2 surgeries  . TUMOR REMOVAL    . VAGOTOMY  01/23/2012   Procedure: VAGOTOMY, antrectomy and BII;  Surgeon: Haywood Lasso, MD;  Location: [place];  Service: General;  Laterality: N/A;  Laparotomy with vagotomy.    Vitals:   02/13/18 2030  BP: 120/90    Subjective Assessment - 02/13/18 1453    Subjective  No further falls; watched video Robin asked her to watch.  No issues to report.  Pain in leg has returned but it isn't debilitating.  Walked 1/2 mile on treadmill last night slowly.    Patient is accompained by:  Family member    Limitations  Standing;Walking    Currently in Pain?  No/denies         [facility] PT Assessment - 02/13/18 1518  Ambulation/Gait   Ambulation/Gait  Yes    Ambulation/Gait Assistance  Other (comment) two for safety    Ambulation/Gait Assistance Details  with 2 10lb weights on either side of pelvis and then changing to one 10lb weight on posterior pelvis to simulate therapist providing tactile and proprioceptive input for increased proximal stability during gait to improve LE coordination and sequencing    Ambulation Distance (Feet)  345 Feet    Assistive device  None    Gait Pattern  Step-through pattern;Decreased step length - right;Decreased step length - left;Decreased stance time - left;Decreased stride length;Decreased hip/knee flexion - left;Ataxic;Decreased trunk rotation;Decreased weight shift to right     Ambulation Surface  Level;Indoor    Stairs  Yes    Stairs Assistance  4: Min assist    Stair Management Technique  One rail Right;Alternating pattern;Forwards    Number of Stairs  12    Height of Stairs  6      Dynamic Gait Index   Level Surface  Moderate Impairment    Change in Gait Speed  Mild Impairment    Gait with Horizontal Head Turns  Mild Impairment    Gait with Vertical Head Turns  Mild Impairment    Gait and Pivot Turn  Mild Impairment    Step Over Obstacle  Moderate Impairment    Step Around Obstacles  Moderate Impairment    Steps  Mild Impairment    Total Score  13    DGI comment:  13/24                          PT Education - 02/13/18 2027    Education provided  Yes    Education Details  STG achievement; areas to continue to focus on, ways to stay active at home    Person(s) Educated  Patient    Methods  Explanation    Comprehension  Verbalized understanding       PT Short Term Goals - 02/13/18 2045      PT SHORT TERM GOAL #1   Title  Pt will improve DGI to >/= 17/24 without AD    Baseline  13/24    Time  4    Period  Weeks    Status  Not Met      PT SHORT TERM GOAL #2   Title  Pt will decrease falls risk with community gait as indicated by increase in gait velocity with LRAD to > or = 2.6 ft/sec    Baseline  (3/5)1.30f/sec with RW and 2.04 without     Time  4    Period  Weeks    Status  Not Met      PT SHORT TERM GOAL #3   Title  Pt will decrease falls risk as indicated by increase in BERG balance score to > or = 50/56    Baseline  3/5 Berg 43/56    Time  4    Period  Weeks    Status  Not Met      PT SHORT TERM GOAL #4   Title  Pt will ambulate >230' on indoor surfaces without AD with supervision    Time  4    Period  Weeks    Status  Not Met        PT Long Term Goals - 01/15/18 1243      PT LONG TERM GOAL #1   Title  Pt and husband will demonstrate independence  with HEP, including use of treadmill    Time  8     Period  Weeks    Status  Revised    Target Date  03/15/18      PT LONG TERM GOAL #2   Title  Pt will demonstrate improved balance and decreased falls risk as indicated by BERG balance score of > or = 52/56    Time  8    Period  Weeks    Status  Revised    Target Date  03/15/18      PT LONG TERM GOAL #3   Title  Pt will ambulate without AD x 250' over outdoor paved surfaces with supervision; will negotiate 4 stairs with one rail with alternating sequence and supervision    Time  8    Period  Weeks    Status  Revised    Target Date  03/15/18      PT LONG TERM GOAL #4   Title  Pt will report improvement in Neuro QOL-LE to >/= 40%    Baseline  35.8%    Time  8    Period  Weeks    Status  Revised    Target Date  03/15/18      PT LONG TERM GOAL #5   Title  Pt will decrease falls risk during gait in home/community as indicated by increase in gait velocity to > or = 3.0 ft/sec    Time  8    Period  Weeks    Status  Revised    Target Date  03/15/18      PT LONG TERM GOAL #6   Title  Pt will demonstrate decreased falls risk with gait as indicated by increase in DGI score to >/= 19/24 without AD    Time  8    Period  Weeks    Status  Revised    Target Date  03/15/18            Plan - 02/13/18 2030    Clinical Impression Statement  Completed assessment of STG with reassessment of DGI and gait training on indoor surfaces without AD but with use of weights around pelvis for increased proprioceptive input and proximal stability.  Pt demonstrated slight decrease in DGI score to 13/24 and during gait without AD pt continues to require significant assistance for facilitation of lateral weight shifting, balance and control of forwards weight shift due to continued anterior LOB.  Pt continues to be limited by RLE pain during increased WB and increased stride length with gait.  Following 2 falls pt demonstrated decline in overall function due to decreased mobility/activity at home;  continued to encourage pt in ways to remain active at home.  Will continue to address gait, balance, coordination impairments to make progress towards LTG.      Rehab Potential  Good    PT Frequency  2x / week    PT Duration  8 weeks    PT Treatment/Interventions  ADLs/Self Care Home Management;Aquatic Therapy;DME Instruction;Gait training;Stair training;Functional mobility training;Therapeutic activities;Therapeutic exercise;Balance training;Neuromuscular re-education;Patient/family education;Vestibular;Visual/perceptual remediation/compensation;Cognitive remediation;Orthotic Fit/Training    PT Next Visit Plan  gait with increased input through pelvis (tactile or weight?), treadmill single and bilat LE on belt; weight shifting on rockerboard.  Corner balance and finding midline    Recommended Other Services  dry needle R hip flexor???    Consulted and Agree with Plan of Care  Patient;Family member/caregiver    Family Member Consulted  husband  Patient will benefit from skilled therapeutic intervention in order to improve the following deficits and impairments:  Abnormal gait, Decreased balance, Decreased cognition, Decreased coordination, Decreased strength, Difficulty walking, Dizziness, Impaired sensation, Impaired vision/preception, Pain, Decreased activity tolerance, Decreased mobility  Visit Diagnosis: Ataxic gait  Other lack of coordination  Other abnormalities of gait and mobility  Unsteadiness on feet  Dizziness and giddiness     Problem List Patient Active Problem List   Diagnosis Date Noted  . 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 Weakness with dizziness-  since [facility] and CVA 04/07/17 08/07/2017  . Disturbances of vision, late effect of stroke 08/06/2017  . Health education/counseling 08/05/2017  . High risk medications (not anticoagulants) long-term use 08/05/2017  . Neuritis-  R sided:  arm and leg/ body due to stroke 08/05/2017  . Elevated LDL cholesterol level  07/11/2017  . [employer] (NIH) Stroke Scale limb ataxia score 2, ataxia present in two limbs 06/25/2017  . 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 Elevated vitamin B12 level 05/30/2017  . Vitamin D deficiency 05/29/2017  . History of tobacco abuse-  30pk yr hx - quit 04/07/17 05/21/2017  . Gait disturbance, post-stroke 05/14/2017  . Benign essential HTN   . Vitreous hemorrhage of right eye ([place])   . Adjustment disorder with mixed anxiety and depressed mood   . 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 Terson syndrome of both eyes ([place]) 04/23/2017  . s/p SAH (subarachnoid hemorrhage) ([place]) 04/19/2017  . Basilar artery aneurysm ([place])   . Hypoxia   . Subarachnoid hemorrhage due to ruptured aneurysm ([place]) 04/07/2017  . CVA (cerebral vascular accident) ([place]) 04/07/2017  . Elevated gastrin level 01/25/2012  . Hypokalemia 01/21/2012  . Nausea & vomiting 01/20/2012  . Epigastric pain 01/20/2012  . Duodenal ulcer, acute with obstruction 10/17/2011  . S/P laparoscopic cholecystectomy 10/16/2011   Rico Junker, PT, DPT 02/13/18    8:47 PM    [place] [address] [place], [place], [address] Phone: [phone]   Fax:  [phone]  Name: [name] Barnett MRN: [id number] Date of Birth: [birth year]

## 2018-02-14 ENCOUNTER — Other Ambulatory Visit: Payer: BLUE CROSS/BLUE SHIELD

## 2018-02-14 DIAGNOSIS — I10 Essential (primary) hypertension: Secondary | ICD-10-CM

## 2018-02-14 DIAGNOSIS — F4323 Adjustment disorder with mixed anxiety and depressed mood: Secondary | ICD-10-CM

## 2018-02-14 DIAGNOSIS — R42 Dizziness and giddiness: Secondary | ICD-10-CM

## 2018-02-14 DIAGNOSIS — E7800 Pure hypercholesterolemia, unspecified: Secondary | ICD-10-CM

## 2018-02-14 DIAGNOSIS — Z79899 Other long term (current) drug therapy: Secondary | ICD-10-CM

## 2018-02-14 DIAGNOSIS — I725 Aneurysm of other precerebral arteries: Secondary | ICD-10-CM

## 2018-02-14 DIAGNOSIS — E559 Vitamin D deficiency, unspecified: Secondary | ICD-10-CM

## 2018-02-14 DIAGNOSIS — R531 Weakness: Secondary | ICD-10-CM

## 2018-02-14 DIAGNOSIS — M792 Neuralgia and neuritis, unspecified: Secondary | ICD-10-CM

## 2018-02-14 DIAGNOSIS — E876 Hypokalemia: Secondary | ICD-10-CM

## 2018-02-14 DIAGNOSIS — Z87891 Personal history of nicotine dependence: Secondary | ICD-10-CM

## 2018-02-14 DIAGNOSIS — I639 Cerebral infarction, unspecified: Secondary | ICD-10-CM

## 2018-02-15 LAB — COMPREHENSIVE METABOLIC PANEL
ALBUMIN: 4.1 g/dL (ref 3.5–5.5)
ALT: 13 IU/L (ref 0–32)
AST: 13 IU/L (ref 0–40)
Albumin/Globulin Ratio: 2 (ref 1.2–2.2)
Alkaline Phosphatase: 111 IU/L (ref 39–117)
BUN / CREAT RATIO: 13 (ref 9–23)
BUN: 10 mg/dL (ref 6–24)
Bilirubin Total: <0.2 mg/dL (ref 0.0–1.2)
CALCIUM: 8.9 mg/dL (ref 8.7–10.2)
CO2: 23 mmol/L (ref 20–29)
CREATININE: 0.79 mg/dL (ref 0.57–1.00)
Chloride: 109 mmol/L — ABNORMAL HIGH (ref 96–106)
GFR calc non Af Amer: 93 mL/min/{1.73_m2} (ref >59)
GFR, EST AFRICAN AMERICAN: 107 mL/min/{1.73_m2} (ref >59)
GLUCOSE: 95 mg/dL (ref 65–99)
Globulin, Total: 2.1 g/dL (ref 1.5–4.5)
Potassium: 4.3 mmol/L (ref 3.5–5.2)
Sodium: 146 mmol/L — ABNORMAL HIGH (ref 134–144)
TOTAL PROTEIN: 6.2 g/dL (ref 6.0–8.5)

## 2018-02-15 LAB — LIPID PANEL
CHOLESTEROL TOTAL: 149 mg/dL (ref 100–199)
Chol/HDL Ratio: 2.2 ratio (ref 0.0–4.4)
HDL: 68 mg/dL (ref >39)
LDL CALC: 62 mg/dL (ref 0–99)
Triglycerides: 97 mg/dL (ref 0–149)
VLDL CHOLESTEROL CAL: 19 mg/dL (ref 5–40)

## 2018-02-15 LAB — HEMOGLOBIN A1C
ESTIMATED AVERAGE GLUCOSE: 103 mg/dL
HEMOGLOBIN A1C: 5.2 % (ref 4.8–5.6)

## 2018-02-15 LAB — MAGNESIUM: Magnesium: 2.1 mg/dL (ref 1.6–2.3)

## 2018-02-15 LAB — VITAMIN B12: Vitamin B-12: 1818 pg/mL — ABNORMAL HIGH (ref 232–1245)

## 2018-02-15 LAB — VITAMIN D 25 HYDROXY (VIT D DEFICIENCY, FRACTURES): Vit D, 25-Hydroxy: 52.2 ng/mL (ref 30.0–100.0)

## 2018-02-15 LAB — T4, FREE: Free T4: 0.95 ng/dL (ref 0.82–1.77)

## 2018-02-15 LAB — TSH: TSH: 0.325 u[IU]/mL — ABNORMAL LOW (ref 0.450–4.500)

## 2018-02-15 LAB — PHOSPHORUS: Phosphorus: 3.7 mg/dL (ref 2.5–4.5)

## 2018-02-15 LAB — T3, FREE: T3 FREE: 3.6 pg/mL (ref 2.0–4.4)

## 2018-02-17 ENCOUNTER — Ambulatory Visit: Payer: BLUE CROSS/BLUE SHIELD | Admitting: Family Medicine

## 2018-02-17 ENCOUNTER — Encounter: Payer: Self-pay | Admitting: Family Medicine

## 2018-02-17 VITALS — BP 117/78 | HR 98 | Ht 63.25 in | Wt 160.0 lb

## 2018-02-17 DIAGNOSIS — Z87891 Personal history of nicotine dependence: Secondary | ICD-10-CM | POA: Diagnosis not present

## 2018-02-17 DIAGNOSIS — E876 Hypokalemia: Secondary | ICD-10-CM | POA: Diagnosis not present

## 2018-02-17 DIAGNOSIS — Z79899 Other long term (current) drug therapy: Secondary | ICD-10-CM | POA: Diagnosis not present

## 2018-02-17 DIAGNOSIS — I639 Cerebral infarction, unspecified: Secondary | ICD-10-CM | POA: Diagnosis not present

## 2018-02-17 DIAGNOSIS — E559 Vitamin D deficiency, unspecified: Secondary | ICD-10-CM

## 2018-02-17 DIAGNOSIS — I609 Nontraumatic subarachnoid hemorrhage, unspecified: Secondary | ICD-10-CM | POA: Diagnosis not present

## 2018-02-17 DIAGNOSIS — F4323 Adjustment disorder with mixed anxiety and depressed mood: Secondary | ICD-10-CM | POA: Diagnosis not present

## 2018-02-17 DIAGNOSIS — I10 Essential (primary) hypertension: Secondary | ICD-10-CM

## 2018-02-17 DIAGNOSIS — E7800 Pure hypercholesterolemia, unspecified: Secondary | ICD-10-CM | POA: Diagnosis not present

## 2018-02-17 MED ORDER — VITAMIN D (ERGOCALCIFEROL) 1.25 MG (50000 UNIT) PO CAPS: 50000.0000 [IU] | ORAL_CAPSULE | ORAL | 10 refills | Status: DC

## 2018-02-17 NOTE — Progress Notes (Signed)
Assessment and plan:  1. Cerebrovascular accident (CVA), unspecified mechanism ([facility])   2. Benign essential HTN   3. Elevated LDL cholesterol level   4. Adjustment disorder with mixed anxiety and depressed mood   5. s/p SAH (subarachnoid hemorrhage) ([place])   6. History of tobacco abuse-  30pk yr hx - quit 04/07/17   7. Vitamin D deficiency   8. Hypokalemia   9. High risk medications (not anticoagulants) long-term use     1. CVA- pt has neurologist, Dr. Kathyrn Sheriff, who is monitoring her closely.  2. HTN: BP well-controlled and stable today. Continue meds as listed below.  Continue checking your BP at home and bring a log in for next chronic OV.  3. LDL- Fasting lipid profile results from 02-14-18 were all WNL and LDL much improved. Sx stable and pt is asymptomatic. Continue diet and exercise. Continue meds as listed below.   4.  Adjustment disorder- pt is tolerating her meds well without complication. Sx stable at this time. Recommend seeing a counselor if needed in future.  5. S/p SAH-  6. H/o tobacco abuse- Pt strongly recommended to stop smoking. Use alternatives to smoking like chewing nicotine gum or tobacco-free pipes, if you need this.  7. H/o Vitamin D insufficiency -Start ergocalciferol once weekly.  -Pt is tolerating her other supplements (multivitamin) Continue your supplements as listed below.  8. Hypokalemia- potassium levels stable. Drink adequate amounts of water per day, equal to one half of your weight in oz.   9. High risk medications- labs stable.   -Continue walking on the treadmill.   -Despite her TSH being 0.325 and outside of the normal lower limit, because her individual thyroid hormones are normal, this is not of concern at this time. She is asymptomatic.  -Vitamin B-12 has been chronically slightly elevated in the past. She was advised by her neurologist to take adequate amounts of vitamin  B12. Continue taking your daily multivitamin as listed below.    Education and routine counseling performed. Handouts provided.   Meds ordered this encounter  Medications  . Vitamin D, Ergocalciferol, (DRISDOL) 50000 units CAPS capsule    Sig: Take 1 capsule (50,000 Units total) by mouth every 7 (seven) days.    Dispense:  12 capsule    Refill:  10     Return for Hypertension follow up every 3-4 mo.   Anticipatory guidance and routine counseling done re: condition, txmnt options and need for follow up. All questions of patient's were answered.   Gross side effects, risk and benefits, and alternatives of medications discussed with patient.  Patient is aware that all medications have potential side effects and we are unable to predict every sideeffect or drug-drug interaction that may occur.  Expresses verbal understanding and consents to current therapy plan and treatment regiment.  Please see AVS handed out to patient at the end of our visit for additional patient instructions/ counseling done pertaining to today's office visit.  Note: This document was prepared using Dragon voice recognition software and may include unintentional dictation errors.  Pt was in the office today for 40+ minutes, with over 50% time spent in face to face counseling of patients various medical conditions, treatment plans of those medical conditions including medicine management and lifestyle modification, strategies to improve health and well being; and in coordination of care. SEE ABOVE FOR DETAILS   This document serves as a record of services personally performed by [name], DO. It was  created on her behalf by Mayer Masker, a trained medical scribe. The creation of this record is based on the scribe's personal observations and the provider's statements to them.   I have reviewed the above medical documentation for accuracy and completeness and I concur.  [name] 02/17/18 3:06  PM    -------------------------------------------------------------------------------------------------------------- Subjective:   CC:   AUTUM Barnett is a 42 y.o. female who presents to [place] at [facility] today for review and discussion of recent bloodwork that was done.  1. All recent blood work that we ordered was reviewed with patient today.  Patient was counseled on all abnormalities and we discussed dietary and lifestyle changes that could help those values (also medications when appropriate).  Extensive health counseling performed and all patient's concerns/ questions were addressed.   Vitamin D:  she is currently not taking her 5000 OTC IUs daily due to being unable to find it at [facility]. She was taking her ergocalciferol weekly but has since stopped. She takes a multivitamin daily.    Vitamin B12:  she is not taking any supplements for this. She has a h/o elevated vitamin b12.   Generalized myalgias: Pt has h/o stroke. She still has aches and pains.  Exercise:  she has been walking 30 minutes on her treadmill, which has been improved over time recently (she increased this from 20 minutes to 22 minutes, then eventually up to 30).   Diet:  Per pt: she has not really been drinking more water. She has been eating a lot of salt and she "loves salt". She eats a lot of cheese. She does not drink.   Smoking: She is smoking 3-5 times a week because "she can" and that she does not want to, but she likes it. She does not know why she wants to smoke.    HTN HPI:  -  Her blood pressure has been controlled at home.  Pt is checking it at home.   117/86 with pulse of 76 121/79 with pulse of 78 111/91 with pulse of 75 120/89 with pulse of 76 138/93 with pulse of 69  - Patient reports good compliance with blood pressure medications  - Denies medication S-E   - Smoking Status noted   - She denies new onset of: chest pain, exercise intolerance, shortness of  breath, dizziness, visual changes, headache, lower extremity swelling or claudication, or lightheadedness beyond baseline.    Last 3 blood pressure readings in our office are as follows: BP Readings from Last 3 Encounters:  02/17/18 117/78  02/13/18 120/90  02/11/18 (!) 136/92    Filed Weights   02/17/18 0938  Weight: 160 lb (72.6 kg)    Wt Readings from Last 3 Encounters:  02/17/18 160 lb (72.6 kg)  01/13/18 160 lb (72.6 kg)  01/07/18 163 lb (73.9 kg)   BP Readings from Last 3 Encounters:  02/17/18 117/78  02/13/18 120/90  02/11/18 (!) 136/92   Pulse Readings from Last 3 Encounters:  02/17/18 98  02/11/18 76  02/04/18 (!) 109   BMI Readings from Last 3 Encounters:  02/17/18 28.12 kg/m  01/13/18 28.12 kg/m  01/07/18 27.98 kg/m     Patient Care Team    Relationship Specialty Notifications Start End  [name], DO PCP - General Family Medicine  04/30/17   Wilford Corner, MD Consulting Physician Gastroenterology  10/17/11   Consuella Lose, MD Consulting Physician Neurosurgery  05/21/17   Charlett Blake, MD Consulting Physician Physical Medicine  and Rehabilitation  05/21/17   Brien Few, MD Consulting Physician Obstetrics and Gynecology  05/21/17   Jalene Mullet, MD Consulting Physician Ophthalmology  06/13/17    Comment: Neuro ophthalmologist    Full medical history updated and reviewed in the office today  Patient Active Problem List   Diagnosis Date Noted  . Neuritis-  R sided:  arm and leg/ body due to stroke 08/05/2017    Priority: High  . Elevated LDL cholesterol level 07/11/2017    Priority: High  . History of tobacco abuse-  30pk yr hx - quit 04/07/17 05/21/2017    Priority: High  . Benign essential HTN     Priority: High  . 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 Subarachnoid hemorrhage due to ruptured aneurysm ([place]) 04/07/2017    Priority: High  . CVA (cerebral vascular accident) ([place]) 04/07/2017    Priority: High   . Vitamin D deficiency 05/29/2017    Priority: Medium  . Adjustment disorder with mixed anxiety and depressed mood     Priority: Medium  . 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 Gait disturbance, post-stroke 05/14/2017    Priority: Low  . Vitreous hemorrhage of right eye ([name])     Priority: Low  . s/p SAH (subarachnoid hemorrhage) ([place]) 04/19/2017    Priority: Low  . Duodenal ulcer, acute with obstruction 10/17/2011    Priority: Low  . 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 Weakness with dizziness-  since [place] and CVA 04/07/17 08/07/2017  . Disturbances of vision, late effect of stroke 08/06/2017  . Health education/counseling 08/05/2017  . High risk medications (not anticoagulants) long-term use 08/05/2017  . [employer] [facility] (NIH) Stroke Scale limb ataxia score 2, ataxia present in two limbs 06/25/2017  . Elevated vitamin B12 level 05/30/2017  . 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 Hypoxia   . Elevated gastrin level 01/25/2012  . Hypokalemia 01/21/2012  . Nausea & vomiting 01/20/2012  . Epigastric pain 01/20/2012  . S/P laparoscopic cholecystectomy 10/16/2011    Past Medical History:  Diagnosis Date  . Duodenal obstruction   . GERD (gastroesophageal reflux disease)   . Hypertension   . Stroke [facility])    april 2018    Past Surgical History:  Procedure Laterality Date  . ABDOMINAL HYSTERECTOMY    . BALLOON DILATION  12/31/2011   Procedure: BALLOON DILATION;  Surgeon: Missy Sabins, MD;  Location[facility] ENDOSCOPY;  Service: Endoscopy;  Laterality: N/A;  . CHOLECYSTECTOMY  07/28/11  . ESOPHAGOGASTRODUODENOSCOPY  10/17/2011   Procedure: ESOPHAGOGASTRODUODENOSCOPY (EGD);  Surgeon: Missy Sabins, MD;  Location[facility] ENDOSCOPY;  Service: Endoscopy;  Laterality: N/A;  . IR ANGIO INTRA EXTRACRAN SEL INTERNAL CAROTID BILAT MOD SED  04/07/2017  . IR ANGIO INTRA EXTRACRAN  SEL INTERNAL CAROTID BILAT MOD SED  01/07/2018  . IR ANGIO VERTEBRAL SEL VERTEBRAL UNI R MOD SED  04/07/2017  . IR ANGIO VERTEBRAL SEL VERTEBRAL UNI R MOD SED  01/07/2018  . IR ANGIOGRAM FOLLOW UP STUDY  04/07/2017  . IR ANGIOGRAM FOLLOW UP STUDY  04/07/2017  . IR ANGIOGRAM FOLLOW UP STUDY  04/07/2017  . IR ANGIOGRAM FOLLOW UP STUDY  04/07/2017  . IR ANGIOGRAM FOLLOW UP STUDY  04/07/2017  . IR ANGIOGRAM SELECTIVE EACH ADDITIONAL VESSEL  04/07/2017  . IR TRANSCATH/EMBOLIZ  04/07/2017  . IR US GUIDE VASC  ACCESS RIGHT  01/07/2018  . PARS PLANA VITRECTOMY Right 05/01/2017   Procedure: PARS PLANA VITRECTOMY WITH 25 GAUGE RIGHT EYE, endolaser photocoaglation;  Surgeon: Jalene Mullet, MD;  Location: [facility];  Service: Ophthalmology;  Laterality: Right;  . RADIOLOGY WITH ANESTHESIA N/A 04/07/2017   Procedure: RADIOLOGY WITH ANESTHESIA;  Surgeon: Consuella Lose, MD;  Location: [place];  Service: Radiology;  Laterality: N/A;  . REPLACEMENT TOTAL KNEE BILATERAL  2004  . TEMPOROMANDIBULAR JOINT SURGERY     2 surgeries  . TUMOR REMOVAL    . VAGOTOMY  01/23/2012   Procedure: VAGOTOMY, antrectomy and BII;  Surgeon: Haywood Lasso, MD;  Location: [place];  Service: General;  Laterality: N/A;  Laparotomy with vagotomy.    Social History   Tobacco Use  . Smoking status: Former Smoker    Packs/day: 1.00    Years: 20.00    Pack years: 20.00    Types: Cigarettes    Last attempt to quit: 04/07/2017    Years since quitting: 0.8  . Smokeless tobacco: Never Used  Substance Use Topics  . Alcohol use: No    Family Hx: Family History  Problem Relation Age of Onset  . Hypertension Mother   . Restless legs syndrome Mother   . Hyperlipidemia Mother   . Heart disease Father   . Malignant hyperthermia Neg Hx      Medications: Current Outpatient Medications  Medication Sig Dispense Refill  . amLODipine (NORVASC) 2.5 MG tablet Take 2.5 mg by mouth daily.  1  . atorvastatin (LIPITOR) 20 MG tablet Take 1 tablet  (20 mg total) by mouth at bedtime. 90 tablet 3  . Coconut Oil 1000 MG CAPS Take 1,000 mg by mouth daily.    [name] FLUoxetine (PROZAC) 20 MG capsule Take 2 capsules (40 mg total) by mouth daily. (Patient taking differently: Take 20 mg by mouth 2 (two) times daily. ) 180 capsule 1  . gabapentin (NEURONTIN) 600 MG tablet Take 1 tablet (600 mg total) by mouth 4 (four) times daily. (Patient taking differently: Take 600 mg by mouth 4 (four) times daily as needed (nerve pain). ) 360 tablet 1  . Multiple Vitamin (MULTIVITAMIN WITH MINERALS) TABS tablet Take 1 tablet by mouth daily.    . niacin 500 MG tablet Take 500 mg by mouth at bedtime.    . topiramate (TOPAMAX) 50 MG tablet Take 1 tablet (50 mg total) by mouth 2 (two) times daily. 90 tablet 0  . valsartan (DIOVAN) 40 MG tablet Take 0.5 tablets (20 mg total) by mouth daily. 45 tablet 1  . Vitamin D, Ergocalciferol, (DRISDOL) 50000 units CAPS capsule Take 1 capsule (50,000 Units total) by mouth every 7 (seven) days. 12 capsule 10   No current facility-administered medications for this visit.     Allergies:  Allergies  Allergen Reactions  . Nsaids Other (See Comments)    Pt diagnosed with near-obstructing circumferential ulcer of duodenum [id number]     Review of Systems: General:   No F/C, wt loss Pulm:   No DIB, SOB, pleuritic chest pain Card:  No CP, palpitations Abd:  No n/v/d or pain Ext:  No inc edema from baseline  Objective:  Blood pressure 117/78, pulse 98, height 5' 3.25" (1.607 m), weight 160 lb (72.6 kg), last menstrual period 01/29/2018, SpO2 100 %. Body mass index is 28.12 kg/m. Gen:   Well NAD, A and O *3 HEENT:    [place]/AT, EOMI,  MMM Lungs:   Normal work of breathing. CTA  B/L, no Wh, rhonchi Heart:   RRR, S1, S2 WNL's, no MRG Abd:   No gross distention Exts:    warm, pink,  Brisk capillary refill, warm and well perfused.  Psych:    No HI/SI, judgement and insight good, Euthymic mood. Full Affect.   Recent Results (from the  past 2160 hour(s))  APTT     Status: None   Collection Time: 01/07/18 10:44 AM  Result Value Ref Range   aPTT 33 24 - 36 seconds  Basic metabolic panel     Status: Abnormal   Collection Time: 01/07/18 10:44 AM  Result Value Ref Range   Sodium 140 135 - 145 mmol/L   Potassium 4.4 3.5 - 5.1 mmol/L   Chloride 110 101 - 111 mmol/L   CO2 19 (L) 22 - 32 mmol/L   Glucose, Bld 83 65 - 99 mg/dL   BUN 15 6 - 20 mg/dL   Creatinine, Ser 0.74 0.44 - 1.00 mg/dL   Calcium 8.9 8.9 - 10.3 mg/dL   GFR calc non Af Amer >60 >60 mL/min   GFR calc Af Amer >60 >60 mL/min    Comment: (NOTE) The eGFR has been calculated using the CKD EPI equation. This calculation has not been validated in all clinical situations. eGFR's persistently <60 mL/min signify possible Chronic Kidney Disease.    Anion gap 11 5 - 15  CBC WITH DIFFERENTIAL     Status: None   Collection Time: 01/07/18 10:44 AM  Result Value Ref Range   WBC 9.9 4.0 - 10.5 K/uL   RBC 4.46 3.87 - 5.11 MIL/uL   Hemoglobin 14.4 12.0 - 15.0 g/dL   HCT 44.6 36.0 - 46.0 %   MCV 100.0 78.0 - 100.0 fL   MCH 32.3 26.0 - 34.0 pg   MCHC 32.3 30.0 - 36.0 g/dL   RDW 13.4 11.5 - 15.5 %   Platelets 305 150 - 400 K/uL   Neutrophils Relative % 62 %   Neutro Abs 6.1 1.7 - 7.7 K/uL   Lymphocytes Relative 31 %   Lymphs Abs 3.1 0.7 - 4.0 K/uL   Monocytes Relative 6 %   Monocytes Absolute 0.6 0.1 - 1.0 K/uL   Eosinophils Relative 1 %   Eosinophils Absolute 0.1 0.0 - 0.7 K/uL   Basophils Relative 0 %   Basophils Absolute 0.0 0.0 - 0.1 K/uL  Protime-INR     Status: None   Collection Time: 01/07/18 10:44 AM  Result Value Ref Range   Prothrombin Time 12.4 11.4 - 15.2 seconds   INR 0.93   T3, free     Status: None   Collection Time: 02/14/18  8:54 AM  Result Value Ref Range   T3, Free 3.6 2.0 - 4.4 pg/mL  T4, free     Status: None   Collection Time: 02/14/18  8:54 AM  Result Value Ref Range   Free T4 0.95 0.82 - 1.77 ng/dL  TSH     Status: Abnormal    Collection Time: 02/14/18  8:54 AM  Result Value Ref Range   TSH 0.325 (L) 0.450 - 4.500 uIU/mL  VITAMIN D 25 Hydroxy (Vit-D Deficiency, Fractures)     Status: None   Collection Time: 02/14/18  8:54 AM  Result Value Ref Range   Vit D, 25-Hydroxy 52.2 30.0 - 100.0 ng/mL    Comment: Vitamin D deficiency has been defined by the Institute of Medicine and an Endocrine Society practice guideline as a level of serum 25-OH vitamin D  less than 20 ng/mL (1,2). The Endocrine Society went on to further define vitamin D insufficiency as a level between 21 and 29 ng/mL (2). 1. IOM (Institute of Medicine). 2010. Dietary reference    intakes for calcium and D. [place]: The    [employer]. 2. Holick MF, Binkley [place], Bischoff-Ferrari HA, et al.    Evaluation, treatment, and prevention of vitamin D    deficiency: an Endocrine Society clinical practice    guideline. JCEM. 2011 Jul; 96(7):1911-30.   Vitamin B12     Status: Abnormal   Collection Time: 02/14/18  8:54 AM  Result Value Ref Range   Vitamin B-12 1,818 (H) 232 - 1,245 pg/mL  Phosphorus     Status: None   Collection Time: 02/14/18  8:54 AM  Result Value Ref Range   Phosphorus 3.7 2.5 - 4.5 mg/dL  Magnesium     Status: None   Collection Time: 02/14/18  8:54 AM  Result Value Ref Range   Magnesium 2.1 1.6 - 2.3 mg/dL  Comprehensive metabolic panel     Status: Abnormal   Collection Time: 02/14/18  8:54 AM  Result Value Ref Range   Glucose 95 65 - 99 mg/dL   BUN 10 6 - 24 mg/dL   Creatinine, Ser 0.79 0.57 - 1.00 mg/dL   GFR calc non Af Amer 93 >59 mL/min/1.73   GFR calc Af Amer 107 >59 mL/min/1.73   BUN/Creatinine Ratio 13 9 - 23   Sodium 146 (H) 134 - 144 mmol/L   Potassium 4.3 3.5 - 5.2 mmol/L   Chloride 109 (H) 96 - 106 mmol/L   CO2 23 20 - 29 mmol/L   Calcium 8.9 8.7 - 10.2 mg/dL   Total Protein 6.2 6.0 - 8.5 g/dL   Albumin 4.1 3.5 - 5.5 g/dL   Globulin, Total 2.1 1.5 - 4.5 g/dL   Albumin/Globulin Ratio 2.0 1.2 -  2.2   Bilirubin Total <0.2 0.0 - 1.2 mg/dL   Alkaline Phosphatase 111 39 - 117 IU/L   AST 13 0 - 40 IU/L   ALT 13 0 - 32 IU/L  Lipid panel     Status: None   Collection Time: 02/14/18  8:54 AM  Result Value Ref Range   Cholesterol, Total 149 100 - 199 mg/dL   Triglycerides 97 0 - 149 mg/dL   HDL 68 >39 mg/dL   VLDL Cholesterol Cal 19 5 - 40 mg/dL   LDL Calculated 62 0 - 99 mg/dL   Chol/HDL Ratio 2.2 0.0 - 4.4 ratio    Comment:                                   T. Chol/HDL Ratio                                             Men  Women                               1/2 Avg.Risk  3.4    3.3                                   Avg.Risk  5.0  4.4                                2X Avg.Risk  9.6    7.1                                3X Avg.Risk 23.4   11.0   Hemoglobin A1c     Status: None   Collection Time: 02/14/18  8:54 AM  Result Value Ref Range   Hgb A1c MFr Bld 5.2 4.8 - 5.6 %    Comment:          Prediabetes: 5.7 - 6.4          Diabetes: >6.4          Glycemic control for adults with diabetes: <7.0    Est. average glucose Bld gHb Est-mCnc 103 mg/dL

## 2018-02-17 NOTE — Patient Instructions (Signed)
Please continue to monitor your blood pressure at home.  Bring in your blood pressure readings each office visit with me.  -Please do not smoke anymore.  This is extremely detrimental to your health and well-being and will significantly hinder your ability to slowly improve your neurological state  Please realize, EXERCISE IS MEDICINE!  -  American Heart Association [facility]) guidelines for exercise : If you are in good health, without any medical conditions, you should engage in 150 minutes of moderate intensity aerobic activity per week.  This means you should be huffing and puffing throughout your workout.   Engaging in regular exercise will improve brain function and memory, as well as improve mood, boost immune system and help with weight management.  As well as the other, more well-known effects of exercise such as decreasing blood sugar levels, decreasing blood pressure,  and decreasing bad cholesterol levels/ increasing good cholesterol levels.     -  The AHA strongly endorses consumption of a diet that contains a variety of foods from all the food categories with an emphasis on fruits and vegetables; fat-free and low-fat dairy products; cereal and grain products; legumes and nuts; and fish, poultry, and/or extra lean meats.    Excessive food intake, especially of foods high in saturated and trans fats, sugar, and salt, should be avoided.    Adequate water intake of roughly 1/2 of your weight in pounds, should equal the ounces of water per day you should drink.  So for instance, if you're 200 pounds, that would be 100 ounces of water per day.         Mediterranean Diet  Why follow it? Research shows. . Those who follow the Mediterranean diet have a reduced risk of heart disease  . The diet is associated with a reduced incidence of Parkinson's and Alzheimer's diseases . People following the diet may have longer life expectancies and lower rates of chronic diseases  . The Dietary Guidelines  for Americans recommends the Mediterranean diet as an eating plan to promote health and prevent disease  What Is the Mediterranean Diet?  . Healthy eating plan based on typical foods and recipes of Mediterranean-style cooking . The diet is primarily a plant based diet; these foods should make up a majority of meals   Starches - Plant based foods should make up a majority of meals - They are an important sources of vitamins, minerals, energy, antioxidants, and fiber - Choose whole grains, foods high in fiber and minimally processed items  - Typical grain sources include wheat, oats, barley, corn, brown rice, bulgar, farro, millet, polenta, couscous  - Various types of beans include chickpeas, lentils, fava beans, black beans, white beans   Fruits  Veggies - Large quantities of antioxidant rich fruits & veggies; 6 or more servings  - Vegetables can be eaten raw or lightly drizzled with oil and cooked  - Vegetables common to the traditional Mediterranean Diet include: artichokes, arugula, beets, broccoli, brussel sprouts, cabbage, carrots, celery, collard greens, cucumbers, eggplant, kale, leeks, lemons, lettuce, mushrooms, okra, onions, peas, peppers, potatoes, pumpkin, radishes, rutabaga, shallots, spinach, sweet potatoes, turnips, zucchini - Fruits common to the Mediterranean Diet include: apples, apricots, avocados, cherries, clementines, dates, figs, grapefruits, grapes, melons, nectarines, oranges, peaches, pears, pomegranates, strawberries, tangerines  Fats - Replace butter and margarine with healthy oils, such as olive oil, canola oil, and tahini  - Limit nuts to no more than a handful a day  - Nuts include walnuts, almonds, pecans, pistachios,  pine nuts  - Limit or avoid candied, honey roasted or heavily salted nuts - Olives are central to the Mediterranean diet - can be eaten whole or used in a variety of dishes   Meats Protein - Limiting red meat: no more than a few times a month - When  eating red meat: choose lean cuts and keep the portion to the size of deck of cards - Eggs: approx. 0 to 4 times a week  - Fish and lean poultry: at least 2 a week  - Healthy protein sources include, chicken, [place], lean beef, lamb - Increase intake of seafood such as tuna, salmon, trout, mackerel, shrimp, scallops - Avoid or limit high fat processed meats such as sausage and bacon  Dairy - Include moderate amounts of low fat dairy products  - Focus on healthy dairy such as fat free yogurt, skim milk, low or reduced fat cheese - Limit dairy products higher in fat such as whole or 2% milk, cheese, ice cream  Alcohol - Moderate amounts of red wine is ok  - No more than 5 oz daily for women (all ages) and men older than age 43  - No more than 10 oz of wine daily for men younger than 2865  Other - Limit sweets and other desserts  - Use herbs and spices instead of salt to flavor foods  - Herbs and spices common to the traditional Mediterranean Diet include: basil, bay leaves, chives, cloves, cumin, fennel, garlic, lavender, marjoram, mint, oregano, parsley, pepper, rosemary, sage, savory, sumac, tarragon, thyme   It's not just a diet, it's a lifestyle:  . The Mediterranean diet includes lifestyle factors typical of those in the region  . Foods, drinks and meals are best eaten with others and savored . Daily physical activity is important for overall good health . This could be strenuous exercise like running and aerobics . This could also be more leisurely activities such as walking, housework, yard-work, or taking the stairs . Moderation is the key; a balanced and healthy diet accommodates most foods and drinks . Consider portion sizes and frequency of consumption of certain foods   Meal Ideas & Options:  . Breakfast:  o Whole wheat toast or whole wheat English muffins with peanut butter & hard boiled egg o Steel cut oats topped with apples & cinnamon and skim milk  o Fresh fruit: banana,  strawberries, melon, berries, peaches  o Smoothies: strawberries, bananas, greek yogurt, peanut butter o Low fat greek yogurt with blueberries and granola  o Egg white omelet with spinach and mushrooms o Breakfast couscous: whole wheat couscous, apricots, skim milk, cranberries  . Sandwiches:  o Hummus and grilled vegetables (peppers, zucchini, squash) on whole wheat bread   o Grilled chicken on whole wheat pita with lettuce, tomatoes, cucumbers or tzatziki  o Tuna salad on whole wheat bread: tuna salad made with greek yogurt, olives, red peppers, capers, green onions o Garlic rosemary lamb pita: lamb sauted with garlic, rosemary, salt & pepper; add lettuce, cucumber, greek yogurt to pita - flavor with lemon juice and black pepper  . Seafood:  o Mediterranean grilled salmon, seasoned with garlic, basil, parsley, lemon juice and black pepper o Shrimp, lemon, and spinach whole-grain pasta salad made with low fat greek yogurt  o Seared scallops with lemon orzo  o Seared tuna steaks seasoned salt, pepper, coriander topped with tomato mixture of olives, tomatoes, olive oil, minced garlic, parsley, green onions and cappers  . Meats:  o  Herbed greek chicken salad with kalamata olives, cucumber, feta  o Red bell peppers stuffed with spinach, bulgur, lean ground beef (or lentils) & topped with feta   o Kebabs: skewers of chicken, tomatoes, onions, zucchini, squash  o [place] burgers: made with red onions, mint, dill, lemon juice, feta cheese topped with roasted red peppers . Vegetarian o Cucumber salad: cucumbers, artichoke hearts, celery, red onion, feta cheese, tossed in olive oil & lemon juice  o Hummus and whole grain pita points with a greek salad (lettuce, tomato, feta, olives, cucumbers, red onion) o Lentil soup with celery, carrots made with vegetable broth, garlic, salt and pepper  o Tabouli salad: parsley, bulgur, mint, scallions, cucumbers, tomato, radishes, lemon juice, olive oil, salt and  pepper.

## 2018-02-18 ENCOUNTER — Encounter: Payer: Self-pay | Admitting: Physical Therapy

## 2018-02-18 ENCOUNTER — Ambulatory Visit: Payer: BLUE CROSS/BLUE SHIELD | Admitting: Physical Therapy

## 2018-02-18 ENCOUNTER — Ambulatory Visit: Payer: BLUE CROSS/BLUE SHIELD | Admitting: Occupational Therapy

## 2018-02-18 DIAGNOSIS — R208 Other disturbances of skin sensation: Secondary | ICD-10-CM

## 2018-02-18 DIAGNOSIS — R2681 Unsteadiness on feet: Secondary | ICD-10-CM

## 2018-02-18 DIAGNOSIS — R260 Ataxic gait: Secondary | ICD-10-CM

## 2018-02-18 DIAGNOSIS — R2689 Other abnormalities of gait and mobility: Secondary | ICD-10-CM

## 2018-02-18 DIAGNOSIS — I69054 Hemiplegia and hemiparesis following nontraumatic subarachnoid hemorrhage affecting left non-dominant side: Secondary | ICD-10-CM

## 2018-02-18 DIAGNOSIS — R41840 Attention and concentration deficit: Secondary | ICD-10-CM

## 2018-02-18 DIAGNOSIS — R41842 Visuospatial deficit: Secondary | ICD-10-CM

## 2018-02-18 DIAGNOSIS — R278 Other lack of coordination: Secondary | ICD-10-CM

## 2018-02-18 NOTE — Patient Instructions (Signed)
Have your husband help you to lower down off the couch onto your knees, holding onto the couch, rock back on your heels, then rock forwards 10-15 reps 1-2 x day, stop if increased pain

## 2018-02-18 NOTE — Patient Instructions (Addendum)
Treadmill   Walk with trunk upright looking forward NOT DOWN. 1. Both legs on belt.  Think about reaching heels forward for long full steps. Go for 3-5 minutes. STOP TREADMILL AND REST STANDING UPRIGHT 2. Stand with right leg beside belt and left leg on the belt. Keep left leg on right side of belt so leg is under your hip not out to the side. Full steps reaching heel forward, letting leg drift behind you so roll off the toe. Go for 3-5 minutes.  STOP TREADMILL AND REST STANDING UPRIGHT 3. Stand with left leg beside belt and right leg on the belt. Keep right leg on right side of belt so leg is under your hip not out to the side. Full steps reaching heel forward, letting leg drift behind you so roll off the toe. Go for 3-5 minutes. STOP TREADMILL AND REST STANDING UPRIGHT 4. Both legs on belt.  Think about reaching heels forward for long full steps. Go for 3-5 minutes.  Walk with walker over ground for immediate follow-up thinking about above motions

## 2018-02-19 NOTE — Therapy (Signed)
[facility] [address] [place], [place], [address] Phone: [phone]   Fax:  [phone]  Occupational Therapy Treatment  Patient Details  Name: [name] Barnett MRN: [id number] Date of Birth: [birth year] Referring Provider: Erick Colace, MD   Encounter Date: 02/18/2018  OT End of Session - 02/18/18 1633    Visit Number  44    Number of Visits  50    Date for OT Re-Evaluation  04/05/18    Authorization Type  [employer], no visit limit/auth req.    Authorization Time Period  renewal completed 11/14/17 for 8 wks.  renewal completed 01/09/18 for 5 weeks/10 visits    OT Start Time  1536    OT Stop Time  1615    OT Time Calculation (min)  39 min    Activity Tolerance  Patient tolerated treatment well    Behavior During Therapy  WFL for tasks assessed/performed       Past Medical History:  Diagnosis Date  . Duodenal obstruction   . GERD (gastroesophageal reflux disease)   . Hypertension   . Stroke [facility])    april 2018    Past Surgical History:  Procedure Laterality Date  . ABDOMINAL HYSTERECTOMY    . BALLOON DILATION  12/31/2011   Procedure: BALLOON DILATION;  Surgeon: Barrie Folk, MD;  Location: [facility] ENDOSCOPY;  Service: Endoscopy;  Laterality: N/A;  . CHOLECYSTECTOMY  07/28/11  . ESOPHAGOGASTRODUODENOSCOPY  10/17/2011   Procedure: ESOPHAGOGASTRODUODENOSCOPY (EGD);  Surgeon: Barrie Folk, MD;  Location[facility] ENDOSCOPY;  Service: Endoscopy;  Laterality: N/A;  . IR ANGIO INTRA EXTRACRAN SEL INTERNAL CAROTID BILAT MOD SED  04/07/2017  . IR ANGIO INTRA EXTRACRAN SEL INTERNAL CAROTID BILAT MOD SED  01/07/2018  . IR ANGIO VERTEBRAL SEL VERTEBRAL UNI R MOD SED  04/07/2017  . IR ANGIO VERTEBRAL SEL VERTEBRAL UNI R MOD SED  01/07/2018  . IR ANGIOGRAM FOLLOW UP STUDY  04/07/2017  . IR ANGIOGRAM FOLLOW UP STUDY  04/07/2017  . IR ANGIOGRAM FOLLOW UP STUDY  04/07/2017  . IR ANGIOGRAM FOLLOW UP STUDY  04/07/2017  . IR ANGIOGRAM FOLLOW UP  STUDY  04/07/2017  . IR ANGIOGRAM SELECTIVE EACH ADDITIONAL VESSEL  04/07/2017  . IR TRANSCATH/EMBOLIZ  04/07/2017  . IR US GUIDE VASC ACCESS RIGHT  01/07/2018  . PARS PLANA VITRECTOMY Right 05/01/2017   Procedure: PARS PLANA VITRECTOMY WITH 25 GAUGE RIGHT EYE, endolaser photocoaglation;  Surgeon: Carmela Rima, MD;  Location: [facility] OR;  Service: Ophthalmology;  Laterality: Right;  . RADIOLOGY WITH ANESTHESIA N/A 04/07/2017   Procedure: RADIOLOGY WITH ANESTHESIA;  Surgeon: Lisbeth Renshaw, MD;  Location: [facility] OR;  Service: Radiology;  Laterality: N/A;  . REPLACEMENT TOTAL KNEE BILATERAL  2004  . TEMPOROMANDIBULAR JOINT SURGERY     2 surgeries  . TUMOR REMOVAL    . VAGOTOMY  01/23/2012   Procedure: VAGOTOMY, antrectomy and BII;  Surgeon: Currie Paris, MD;  Location: [facility] OR;  Service: General;  Laterality: N/A;  Laparotomy with vagotomy.    There were no vitals filed for this visit.  Subjective Assessment - 02/18/18 1538    Subjective   Denies fall    Pertinent History  Subarachnoid hemorrhage due to ruptured aneurysm, ataxia (L cerebellar); HTN, R side decr sensation, vertical diplopia/visual deficits, ataxia, Bilateral retinal hemorhage associated with SAH (with surgery on R eye in hospital and L approx 1 month ago)    Limitations  visual deficits, fall risk, impulsivity/cognitive deficits  Patient Stated Goals  be able to walk and use L arm    Currently in Pain?  Yes    Pain Score  8     Pain Location  Leg    Pain Orientation  Right    Pain Descriptors / Indicators  Aching    Pain Type  Acute pain    Pain Onset  More than a month ago    Pain Frequency  Constant    Aggravating Factors   sitting    Pain Relieving Factors  standing              Treatment: Quadraped rocking forwards and backwards with wedge under hips( child's pose then modified cobra) and prone on elbows x 15 reps for core stability hip stretch, min facilitation/ v.c and . Functional mid range reaching with  LUE, ball between knees for increased core activation, pt demonstrates improved LUE control placing large pegs into pegboard, min v.c/ facilitation. Pt continues to require min A for balance and v.c (Pt noted to have a bruise on bilateral arms where her husband is holding arms while she ambulates, pt is in no distress and reports he is not causing her harm). Pt reports increased LUE functional use at home using flow sheet.                 OT Long Term Goals - 02/19/18 0826      OT LONG TERM GOAL #8   Title  Pt will improve coordination for ADLs as shown by completing 9-hole peg test in less than 60sec.    Time  6    Period  Weeks    Status  New      OT LONG TERM GOAL  #9   Baseline  Pt will be independent with transitional movements during IADLs with no cueing for safety/impulsivity.    Time  6    Period  Weeks    Status  New      OT LONG TERM GOAL  #10   TITLE  Pt will be able to reach to retrieve lightweight object from overhead shelf consistently in 9/10 trials.    Time  6    Period  Weeks    Status  New      OT LONG TERM GOAL  #11   TITLE  Pt will be able to attend to functional task for at least without requiring redirection.    Time  6    Period  Weeks    Status  New      OT LONG TERM GOAL  #12   TITLE  I with updated HEP    Time  6    Period  Weeks    Status  New            Plan - 02/19/18 0981    Clinical Impression Statement  Pt is progressing towards goals. She has increased her LUE functional use at home. Pt is being renewed as she has not been seen for all of her visits but frquency has been changed due to pt's recent fall and scheduling issues.    Occupational Profile and client history currently impacting functional performance  Pt is a 42 y.o. female s/p subarachnoid hemorrhage due to ruptured aneurysm, L cerebellar infarct with ataxia.  Pt was working full time and indpendent prior to hospitalization.  Pt lives with husband and cared  for 20 y.o. daughter.  Medical history includes HTN, GERD, and Bilateral retinal hemorhage  associated with SAH (needed surgery).      Occupational performance deficits (Please refer to evaluation for details):  ADL's;IADL's;Leisure;Social Participation;Work    Administrator, artsehab Potential  Good    OT Frequency  -- 6 visits( anticipate d/c after 4)    OT Duration  6 weeks    OT Treatment/Interventions  Self-care/ADL training;Therapeutic exercise;DME and/or AE instruction;Therapeutic activities;Cognitive remediation/compensation;Visual/perceptual remediation/compensation;Passive range of motion;Functional Mobility Training;Neuromuscular education;Cryotherapy;Paraffin;Energy conservation;Manual Therapy;Patient/family education;Ultrasound;Balance training;Moist Heat;Fluidtherapy    Plan  functional activities at walker level, neuro re-ed, updates to HEP    Consulted and Agree with Plan of Care  Family member/caregiver    Family Member Consulted  husband       Patient will benefit from skilled therapeutic intervention in order to improve the following deficits and impairments:  Decreased coordination, Decreased range of motion, Difficulty walking, Abnormal gait, Decreased safety awareness, Impaired sensation, Decreased knowledge of precautions, Decreased balance, Decreased knowledge of use of DME, Impaired UE functional use, Decreased cognition, Decreased mobility, Decreased strength, Impaired vision/preception, Impaired perceived functional ability  Visit Diagnosis: Other lack of coordination  Other abnormalities of gait and mobility  Unsteadiness on feet  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 Weakness with dizziness-  [facility]  [facility] 04/07[facility]17 08/07/2017  . Disturbances of vision, late effect of stroke 08/06/2017  . Health education/counseling 08/05/2017  . High risk medications (not anticoagulants) long-term use 08/05/2017  . Neuritis-  R sided:  arm and leg/ body due to stroke 08/05/2017  . Elevated LDL cholesterol level 07/11/2017  . [employer] (NIH) Stroke Scale limb ataxia score 2, ataxia present in two limbs 06/25/2017  . 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 Elevated vitamin B12 level 05/30/2017  . Vitamin D deficiency 05/29/2017  . History of tobacco abuse-  30pk yr hx - quit 04/07/17 05/21/2017  . Gait disturbance, post-stroke 05/14/2017  . Benign essential HTN   . Vitreous hemorrhage of right eye ([name])   . Adjustment disorder with mixed anxiety and depressed mood   . 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 s/p SAH (subarachnoid hemorrhage) ([name]) 04/19/2017  . Basilar artery aneurysm ([name])   . Hypoxia   . 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 CVA (cerebral vascular accident) ([name]) 04/07/2017  . Elevated gastrin level 01/25/2012  . Hypokalemia 01/21/2012  . Nausea & vomiting 01/20/2012  . Epigastric pain 01/20/2012  . Duodenal ulcer, acute with obstruction 10/17/2011  . S/P laparoscopic cholecystectomy 10/16/2011    RINE,KATHRYN 02/19/2018, 8:30 AM Keene BreathKathryn Rine, OTR/L [phone] Phone: [phone] 8:31 AM 02/19/18 [facility] [address] [place], [place], [address] Phone: [phone]   Fax:  [phone]  Name: Clovia Cuffmber F Troy MRN: [id number] Date of Birth: 12/15/1975

## 2018-02-19 NOTE — Therapy (Signed)
[facility] [address] [place], [place], [address] Phone: [phone]   Fax:  [phone]  Physical Therapy Treatment  Patient Details  Name: [name] Barnett MRN: [id number] Date of Birth: [birth year] Referring Provider: Charlett Blake, MD   Encounter Date: 02/18/2018  PT End of Session - 02/18/18 1928    Visit Number  51    Number of Visits  66    Date for PT Re-Evaluation  03/15/18    Authorization Type  BCBS    PT Start Time  1450    PT Stop Time  1530    PT Time Calculation (min)  40 min    Equipment Utilized During Treatment  Gait belt    Activity Tolerance  Patient tolerated treatment well    Behavior During Therapy  WFL for tasks assessed/performed       Past Medical History:  Diagnosis Date  . Duodenal obstruction   . GERD (gastroesophageal reflux disease)   . Hypertension   . Stroke [facility])    april 2018    Past Surgical History:  Procedure Laterality Date  . ABDOMINAL HYSTERECTOMY    . BALLOON DILATION  12/31/2011   Procedure: BALLOON DILATION;  Surgeon: Missy Sabins, MD;  Location: [facility] ENDOSCOPY;  Service: Endoscopy;  Laterality: N/A;  . CHOLECYSTECTOMY  07/28/11  . ESOPHAGOGASTRODUODENOSCOPY  10/17/2011   Procedure: ESOPHAGOGASTRODUODENOSCOPY (EGD);  Surgeon: Missy Sabins, MD;  Location: [facility] ENDOSCOPY;  Service: Endoscopy;  Laterality: N/A;  . IR ANGIO INTRA EXTRACRAN SEL INTERNAL CAROTID BILAT MOD SED  04/07/2017  . IR ANGIO INTRA EXTRACRAN SEL INTERNAL CAROTID BILAT MOD SED  01/07/2018  . IR ANGIO VERTEBRAL SEL VERTEBRAL UNI R MOD SED  04/07/2017  . IR ANGIO VERTEBRAL SEL VERTEBRAL UNI R MOD SED  01/07/2018  . IR ANGIOGRAM FOLLOW UP STUDY  04/07/2017  . IR ANGIOGRAM FOLLOW UP STUDY  04/07/2017  . IR ANGIOGRAM FOLLOW UP STUDY  04/07/2017  . IR ANGIOGRAM FOLLOW UP STUDY  04/07/2017  . IR ANGIOGRAM FOLLOW UP STUDY  04/07/2017  . IR ANGIOGRAM SELECTIVE EACH ADDITIONAL VESSEL  04/07/2017  . IR  TRANSCATH/EMBOLIZ  04/07/2017  . IR US GUIDE VASC ACCESS RIGHT  01/07/2018  . PARS PLANA VITRECTOMY Right 05/01/2017   Procedure: PARS PLANA VITRECTOMY WITH 25 GAUGE RIGHT EYE, endolaser photocoaglation;  Surgeon: Jalene Mullet, MD;  Location: [place];  Service: Ophthalmology;  Laterality: Right;  . RADIOLOGY WITH ANESTHESIA N/A 04/07/2017   Procedure: RADIOLOGY WITH ANESTHESIA;  Surgeon: Consuella Lose, MD;  Location: [place];  Service: Radiology;  Laterality: N/A;  . REPLACEMENT TOTAL KNEE BILATERAL  2004  . TEMPOROMANDIBULAR JOINT SURGERY     2 surgeries  . TUMOR REMOVAL    . VAGOTOMY  01/23/2012   Procedure: VAGOTOMY, antrectomy and BII;  Surgeon: Haywood Lasso, MD;  Location: [place];  Service: General;  Laterality: N/A;  Laparotomy with vagotomy.    There were no vitals filed for this visit.  Subjective Assessment - 02/18/18 1450    Subjective  No falls. Still walking on treadmill 15-30 minutes.       Patient is accompained by:  Family member    Limitations  Standing;Walking    Currently in Pain?  No/denies      Gait Training Treadmill   Walk with trunk upright looking forward NOT DOWN. 1. Both legs on belt.  Think about reaching heels forward for long full steps. Go for 3-5 minutes. STOP TREADMILL AND  REST STANDING UPRIGHT 2. Stand with right leg beside belt and left leg on the belt. Keep left leg on right side of belt so leg is under your hip not out to the side. Full steps reaching heel forward, letting leg drift behind you so roll off the toe. Go for 3-5 minutes.  STOP TREADMILL AND REST STANDING UPRIGHT 3. Stand with left leg beside belt and right leg on the belt. Keep right leg on right side of belt so leg is under your hip not out to the side. Full steps reaching heel forward, letting leg drift behind you so roll off the toe. Go for 3-5 minutes. STOP TREADMILL AND REST STANDING UPRIGHT 4. Both legs on belt.  Think about reaching heels forward for long full steps. Go for 3-5  minutes.  Walk with walker over ground for immediate follow-up thinking about above motions                        PT Education - 02/18/18 1515    Education provided  Yes    Education Details  Treadmill program see pt instructions & follow up immediately with over ground work with RW for best carryover    Person(s) Educated  Patient;Spouse    Methods  Explanation;Demonstration;Tactile cues;Handout;Verbal cues    Comprehension  Verbalized understanding;Returned demonstration;Verbal cues required;Need further instruction       PT Short Term Goals - 02/13/18 2045      PT SHORT TERM GOAL #1   Title  Pt will improve DGI to >/= 17/24 without AD    Baseline  13/24    Time  4    Period  Weeks    Status  Not Met      PT SHORT TERM GOAL #2   Title  Pt will decrease falls risk with community gait as indicated by increase in gait velocity with LRAD to > or = 2.6 ft/sec    Baseline  (3/5)1.58f/sec with RW and 2.04 without     Time  4    Period  Weeks    Status  Not Met      PT SHORT TERM GOAL #3   Title  Pt will decrease falls risk as indicated by increase in BERG balance score to > or = 50/56    Baseline  3/5 Berg 43/56    Time  4    Period  Weeks    Status  Not Met      PT SHORT TERM GOAL #4   Title  Pt will ambulate >230' on indoor surfaces without AD with supervision    Time  4    Period  Weeks    Status  Not Met        PT Long Term Goals - 01/15/18 1243      PT LONG TERM GOAL #1   Title  Pt and husband will demonstrate independence with HEP, including use of treadmill    Time  8    Period  Weeks    Status  Revised    Target Date  03/15/18      PT LONG TERM GOAL #2   Title  Pt will demonstrate improved balance and decreased falls risk as indicated by BERG balance score of > or = 52/56    Time  8    Period  Weeks    Status  Revised    Target Date  03/15/18      PT LONG  TERM GOAL #3   Title  Pt will ambulate without AD x 250' over outdoor  paved surfaces with supervision; will negotiate 4 stairs with one rail with alternating sequence and supervision    Time  8    Period  Weeks    Status  Revised    Target Date  03/15/18      PT LONG TERM GOAL #4   Title  Pt will report improvement in Neuro QOL-LE to >/= 40%    Baseline  35.8%    Time  8    Period  Weeks    Status  Revised    Target Date  03/15/18      PT LONG TERM GOAL #5   Title  Pt will decrease falls risk during gait in home/community as indicated by increase in gait velocity to > or = 3.0 ft/sec    Time  8    Period  Weeks    Status  Revised    Target Date  03/15/18      PT LONG TERM GOAL #6   Title  Pt will demonstrate decreased falls risk with gait as indicated by increase in DGI score to >/= 19/24 without AD    Time  8    Period  Weeks    Status  Revised    Target Date  03/15/18            Plan - 02/18/18 1930    Clinical Impression Statement  Patient and husband appear to have better understanding of Treadmill program to work on quality of walk over pushing time & speed and safety. They verbalize understanding of need to follow-up with overground work with RW for full steps with heel-toe motion to get best carryover.     Rehab Potential  Good    PT Frequency  2x / week    PT Duration  8 weeks    PT Treatment/Interventions  ADLs/Self Care Home Management;Aquatic Therapy;DME Instruction;Gait training;Stair training;Functional mobility training;Therapeutic activities;Therapeutic exercise;Balance training;Neuromuscular re-education;Patient/family education;Vestibular;Visual/perceptual remediation/compensation;Cognitive remediation;Orthotic Fit/Training    PT Next Visit Plan  gait with increased input through pelvis (tactile or weight?), check on treadmill HEP with single and bilat LE on belt; weight shifting on rockerboard.  Corner balance and finding midline    Consulted and Agree with Plan of Care  Patient;Family member/caregiver    Family Member  Consulted  husband       Patient will benefit from skilled therapeutic intervention in order to improve the following deficits and impairments:  Abnormal gait, Decreased balance, Decreased cognition, Decreased coordination, Decreased strength, Difficulty walking, Dizziness, Impaired sensation, Impaired vision/preception, Pain, Decreased activity tolerance, Decreased mobility  Visit Diagnosis: Ataxic gait  Other abnormalities of gait and mobility  Unsteadiness on feet     Problem List Patient Active Problem List   Diagnosis Date Noted  . 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 Weakness with dizziness-  since [facility] and CVA 04/07/17 08/07/2017  . Disturbances of vision, late effect of stroke 08/06/2017  . Health education/counseling 08/05/2017  . High risk medications (not anticoagulants) long-term use 08/05/2017  . Neuritis-  R sided:  arm and leg/ body due to stroke 08/05/2017  . Elevated LDL cholesterol level 07/11/2017  . [employer] of Health (NIH) Stroke Scale limb ataxia score 2, ataxia present in two limbs 06/25/2017  . 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 Elevated vitamin B12 level 05/30/2017  . Vitamin D deficiency 05/29/2017  . History of tobacco abuse-  30pk yr hx - quit 04/07/17 05/21/2017  . Gait disturbance, post-stroke 05/14/2017  . Benign essential HTN   . Vitreous hemorrhage of right eye ([place])   . Adjustment disorder with mixed anxiety and depressed mood   . 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 s/p SAH (subarachnoid hemorrhage) ([place]) 04/19/2017  . 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 Hypoxia   . Subarachnoid hemorrhage due to ruptured aneurysm ([place]) 04/07/2017  . CVA (cerebral vascular accident) ([place]) 04/07/2017  . Elevated gastrin level 01/25/2012  . Hypokalemia 01/21/2012  . Nausea & vomiting 01/20/2012  . Epigastric pain 01/20/2012  . Duodenal ulcer, acute with  obstruction 10/17/2011  . S/P laparoscopic cholecystectomy 10/16/2011    Jamey Reas PT, DPT 02/19/2018, 2:32 PM  [facility] [address] [place], [place], [address] Phone: [phone]   Fax:  [phone]  Name: [name] Barnett MRN: [id number] Date of Birth: August 10, 1976

## 2018-02-20 ENCOUNTER — Encounter: Payer: Self-pay | Admitting: Physical Therapy

## 2018-02-20 ENCOUNTER — Ambulatory Visit: Payer: BLUE CROSS/BLUE SHIELD | Admitting: Physical Therapy

## 2018-02-20 ENCOUNTER — Ambulatory Visit: Payer: BLUE CROSS/BLUE SHIELD | Admitting: Occupational Therapy

## 2018-02-20 VITALS — BP 110/88

## 2018-02-20 DIAGNOSIS — R2689 Other abnormalities of gait and mobility: Secondary | ICD-10-CM

## 2018-02-20 DIAGNOSIS — R260 Ataxic gait: Secondary | ICD-10-CM

## 2018-02-20 DIAGNOSIS — R42 Dizziness and giddiness: Secondary | ICD-10-CM

## 2018-02-20 DIAGNOSIS — R2681 Unsteadiness on feet: Secondary | ICD-10-CM

## 2018-02-20 DIAGNOSIS — R278 Other lack of coordination: Secondary | ICD-10-CM

## 2018-02-20 DIAGNOSIS — R41840 Attention and concentration deficit: Secondary | ICD-10-CM

## 2018-02-20 DIAGNOSIS — I69054 Hemiplegia and hemiparesis following nontraumatic subarachnoid hemorrhage affecting left non-dominant side: Secondary | ICD-10-CM | POA: Diagnosis not present

## 2018-02-20 DIAGNOSIS — R208 Other disturbances of skin sensation: Secondary | ICD-10-CM

## 2018-02-20 DIAGNOSIS — R41842 Visuospatial deficit: Secondary | ICD-10-CM

## 2018-02-20 NOTE — Therapy (Signed)
[facility] [address] [place], [place], [address] Phone: [phone]   Fax:  [phone]  Occupational Therapy Treatment  Patient Details  Name: [name] Barnett MRN: [id number] Date of Birth: [birth year] Referring Provider: Erick Colace, MD   Encounter Date: 02/20/2018  OT End of Session - 02/20/18 1633    Visit Number  45    Number of Visits  50    Date for OT Re-Evaluation  04/05/18    Authorization Type  BCBS, no visit limit/auth req.    Authorization Time Period  renewal completed 11/14/17 for 8 wks.  renewal completed 01/09/18 for 5 weeks/10 visits    OT Start Time  1537    OT Stop Time  1615    OT Time Calculation (min)  38 min    Activity Tolerance  Patient tolerated treatment well    Behavior During Therapy  WFL for tasks assessed/performed       Past Medical History:  Diagnosis Date  . Duodenal obstruction   . GERD (gastroesophageal reflux disease)   . Hypertension   . Stroke [facility])    april 2018    Past Surgical History:  Procedure Laterality Date  . ABDOMINAL HYSTERECTOMY    . BALLOON DILATION  12/31/2011   Procedure: BALLOON DILATION;  Surgeon: Barrie Folk, MD;  Location: [facility] ENDOSCOPY;  Service: Endoscopy;  Laterality: N/A;  . CHOLECYSTECTOMY  07/28/11  . ESOPHAGOGASTRODUODENOSCOPY  10/17/2011   Procedure: ESOPHAGOGASTRODUODENOSCOPY (EGD);  Surgeon: Barrie Folk, MD;  Location: [facility] ENDOSCOPY;  Service: Endoscopy;  Laterality: N/A;  . IR ANGIO INTRA EXTRACRAN SEL INTERNAL CAROTID BILAT MOD SED  04/07/2017  . IR ANGIO INTRA EXTRACRAN SEL INTERNAL CAROTID BILAT MOD SED  01/07/2018  . IR ANGIO VERTEBRAL SEL VERTEBRAL UNI R MOD SED  04/07/2017  . IR ANGIO VERTEBRAL SEL VERTEBRAL UNI R MOD SED  01/07/2018  . IR ANGIOGRAM FOLLOW UP STUDY  04/07/2017  . IR ANGIOGRAM FOLLOW UP STUDY  04/07/2017  . IR ANGIOGRAM FOLLOW UP STUDY  04/07/2017  . IR ANGIOGRAM FOLLOW UP STUDY  04/07/2017  . IR ANGIOGRAM FOLLOW UP  STUDY  04/07/2017  . IR ANGIOGRAM SELECTIVE EACH ADDITIONAL VESSEL  04/07/2017  . IR TRANSCATH/EMBOLIZ  04/07/2017  . IR US GUIDE VASC ACCESS RIGHT  01/07/2018  . PARS PLANA VITRECTOMY Right 05/01/2017   Procedure: PARS PLANA VITRECTOMY WITH 25 GAUGE RIGHT EYE, endolaser photocoaglation;  Surgeon: Carmela Rima, MD;  Location[facility] OR;  Service: Ophthalmology;  Laterality: Right;  . RADIOLOGY WITH ANESTHESIA N/A 04/07/2017   Procedure: RADIOLOGY WITH ANESTHESIA;  Surgeon: Lisbeth Renshaw, MD;  Location: [facility] OR;  Service: Radiology;  Laterality: N/A;  . REPLACEMENT TOTAL KNEE BILATERAL  2004  . TEMPOROMANDIBULAR JOINT SURGERY     2 surgeries  . TUMOR REMOVAL    . VAGOTOMY  01/23/2012   Procedure: VAGOTOMY, antrectomy and BII;  Surgeon: Currie Paris, MD;  Location: [facility] OR;  Service: General;  Laterality: N/A;  Laparotomy with vagotomy.    There were no vitals filed for this visit.  Subjective Assessment - 02/20/18 1632    Pertinent History  Subarachnoid hemorrhage due to ruptured aneurysm, ataxia (L cerebellar); HTN, R side decr sensation, vertical diplopia/visual deficits, ataxia, Bilateral retinal hemorhage associated with SAH (with surgery on R eye in hospital and L approx 1 month ago)    Limitations  visual deficits, fall risk, impulsivity/cognitive deficits    Patient Stated Goals  be able to walk  and use L arm    Currently in Pain?  No/denies         Treatment: Reviewed stretches in prone issued last visit, 10 reps, min v.c Supine closed chain shoulder flexion, and chest press with 4 lbs weight, min v.c and supervision for safety and seated biceps curls with yellow band for LUE issued as HEP, pt returned demo, instructed to perform only with supervision. Supine unilateral chest press with 2 lbs weight, min v.c / supervision. Seated bilateral shoulder flexion with cane and 4 lbs weight, pt demonstrated decreased control and task was discontinued. Arm bike x 5 mins level 3 for  conditioning.                        OT Long Term Goals - 02/19/18 0826      OT LONG TERM GOAL #8   Title  Pt will improve coordination for ADLs as shown by completing 9-hole peg test in less than 60sec.    Time  6    Period  Weeks    Status  New      OT LONG TERM GOAL  #9   Baseline  Pt will be independent with transitional movements during IADLs with no cueing for safety/impulsivity.    Time  6    Period  Weeks    Status  New      OT LONG TERM GOAL  #10   TITLE  Pt will be able to reach to retrieve lightweight object from overhead shelf consistently in 9/10 trials.    Time  6    Period  Weeks    Status  New      OT LONG TERM GOAL  #11   TITLE  Pt will be able to attend to functional task for at least 15min without requiring redirection.    Time  6    Period  Weeks    Status  New      OT LONG TERM GOAL  #12   TITLE  I with updated HEP    Time  6    Period  Weeks    Status  New            Plan - 02/20/18 1641    Clinical Impression Statement  Pt demonstrates improvements in LUE functional use and control. She reports the stretches are helping.    Rehab Potential  Good    Current Impairments/barriers affecting progress:  cognitive deficits, impulsivity    OT Frequency  -- 6 visits    OT Duration  6 weeks    OT Treatment/Interventions  Self-care/ADL training;Therapeutic exercise;DME and/or AE instruction;Therapeutic activities;Cognitive remediation/compensation;Visual/perceptual remediation/compensation;Passive range of motion;Functional Mobility Training;Neuromuscular education;Cryotherapy;Paraffin;Energy conservation;Manual Therapy;Patient/family education;Ultrasound;Balance training;Moist Heat;Fluidtherapy    Plan  functional activities at walker level, check on HEP.    Consulted and Agree with Plan of Care  Family member/caregiver    Family Member Consulted  husband       Patient will benefit from skilled therapeutic intervention in order  to improve the following deficits and impairments:  Decreased coordination, Decreased range of motion, Difficulty walking, Abnormal gait, Decreased safety awareness, Impaired sensation, Decreased knowledge of precautions, Decreased balance, Decreased knowledge of use of DME, Impaired UE functional use, Decreased cognition, Decreased mobility, Decreased strength, Impaired vision/preception, Impaired perceived functional ability  Visit Diagnosis: Other lack of coordination  Other abnormalities of gait and mobility  Unsteadiness on feet  Other disturbances of skin sensation  Attention and concentration  deficit  Visuospatial deficit  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 Weakness with dizziness-  since [facility] and CVA 04/07/17 08/07/2017  . Disturbances of vision, late effect of stroke 08/06/2017  . Health education/counseling 08/05/2017  . High risk medications (not anticoagulants) long-term use 08/05/2017  . Neuritis-  R sided:  arm and leg/ body due to stroke 08/05/2017  . Elevated LDL cholesterol level 07/11/2017  . [employer] of Health (NIH) Stroke Scale limb ataxia score 2, ataxia present in two limbs 06/25/2017  . 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 Elevated vitamin B12 level 05/30/2017  . Vitamin D deficiency 05/29/2017  . History of tobacco abuse-  30pk yr hx - quit 04/07/17 05/21/2017  . Gait disturbance, post-stroke 05/14/2017  . Benign essential HTN   . Vitreous hemorrhage of right eye ([name])   . Adjustment disorder with mixed anxiety and depressed mood   . 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 s/p SAH (subarachnoid hemorrhage) ([name]) 04/19/2017  . Basilar artery aneurysm ([name])   . Hypoxia   .  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 CVA (cerebral vascular accident) ([name]) 04/07/2017  . Elevated gastrin level 01/25/2012  . Hypokalemia 01/21/2012  . Nausea & vomiting 01/20/2012  . Epigastric pain 01/20/2012  . Duodenal ulcer, acute with obstruction 10/17/2011  . S/P laparoscopic cholecystectomy 10/16/2011    Allysia Ingles 02/20/2018, 4:46 PM  [facility] [address] [place], [place], [address] Phone: [phone]   Fax:  [phone]  Name: [name] Barnett MRN: [id number] Date of Birth: [birth year]

## 2018-02-20 NOTE — Therapy (Signed)
[facility] [address] [place], [place], [address] Phone: [phone]   Fax:  [phone]  Physical Therapy Treatment  Patient Details  Name: [name] Barnett MRN: [id number] Date of Birth: [birth year] Referring Provider: Charlett Blake, MD   Encounter Date: 02/20/2018  PT End of Session - 02/20/18 1639    Visit Number  35    Number of Visits  65    Date for PT Re-Evaluation  03/15/18    Authorization Type  BCBS    PT Start Time  1453    PT Stop Time  1535    PT Time Calculation (min)  42 min    Activity Tolerance  Patient tolerated treatment well    Behavior During Therapy  [facility] for tasks assessed/performed       Past Medical History:  Diagnosis Date  . Duodenal obstruction   . GERD (gastroesophageal reflux disease)   . Hypertension   . Stroke [facility])    april 2018    Past Surgical History:  Procedure Laterality Date  . ABDOMINAL HYSTERECTOMY    . BALLOON DILATION  12/31/2011   Procedure: BALLOON DILATION;  Surgeon: Missy Sabins, MD;  Location: [facility] ENDOSCOPY;  Service: Endoscopy;  Laterality: N/A;  . CHOLECYSTECTOMY  07/28/11  . ESOPHAGOGASTRODUODENOSCOPY  10/17/2011   Procedure: ESOPHAGOGASTRODUODENOSCOPY (EGD);  Surgeon: Missy Sabins, MD;  Location: [facility] ENDOSCOPY;  Service: Endoscopy;  Laterality: N/A;  . IR ANGIO INTRA EXTRACRAN SEL INTERNAL CAROTID BILAT MOD SED  04/07/2017  . IR ANGIO INTRA EXTRACRAN SEL INTERNAL CAROTID BILAT MOD SED  01/07/2018  . IR ANGIO VERTEBRAL SEL VERTEBRAL UNI R MOD SED  04/07/2017  . IR ANGIO VERTEBRAL SEL VERTEBRAL UNI R MOD SED  01/07/2018  . IR ANGIOGRAM FOLLOW UP STUDY  04/07/2017  . IR ANGIOGRAM FOLLOW UP STUDY  04/07/2017  . IR ANGIOGRAM FOLLOW UP STUDY  04/07/2017  . IR ANGIOGRAM FOLLOW UP STUDY  04/07/2017  . IR ANGIOGRAM FOLLOW UP STUDY  04/07/2017  . IR ANGIOGRAM SELECTIVE EACH ADDITIONAL VESSEL  04/07/2017  . IR TRANSCATH/EMBOLIZ  04/07/2017  . IR US GUIDE VASC ACCESS RIGHT   01/07/2018  . PARS PLANA VITRECTOMY Right 05/01/2017   Procedure: PARS PLANA VITRECTOMY WITH 25 GAUGE RIGHT EYE, endolaser photocoaglation;  Surgeon: Jalene Mullet, MD;  Location: [place];  Service: Ophthalmology;  Laterality: Right;  . RADIOLOGY WITH ANESTHESIA N/A 04/07/2017   Procedure: RADIOLOGY WITH ANESTHESIA;  Surgeon: Consuella Lose, MD;  Location: [place];  Service: Radiology;  Laterality: N/A;  . REPLACEMENT TOTAL KNEE BILATERAL  2004  . TEMPOROMANDIBULAR JOINT SURGERY     2 surgeries  . TUMOR REMOVAL    . VAGOTOMY  01/23/2012   Procedure: VAGOTOMY, antrectomy and BII;  Surgeon: Haywood Lasso, MD;  Location: [place];  Service: General;  Laterality: N/A;  Laparotomy with vagotomy.    Vitals:   02/20/18 1501  BP: 110/88    Subjective Assessment - 02/20/18 1458    Subjective  No further falls; healing well from previous falls.  Did treadmill sequence just as prescribed by Shirlean Mylar; worked very well -less pain in RLE and noticed more fluid gait.    Patient is accompained by:  Family member    Limitations  Standing;Walking    Currently in Pain?  No/denies                           Balance Exercises - 02/20/18  1625      Balance Exercises: Standing   Rockerboard  Anterior/posterior;Lateral;EO;Intermittent UE support    Other Standing Exercises  Focus on weight shift training on rockerboard.  Ant/Post with squat <> stand without UE support with focus on keeping COG in midline and controlled forward weight shift through anterior tibial translation instead of genu recurvatum.  Also performed R/L single leg on rockerboard performing controlled anterior/posterior weight shifting with ankle DF<>PF with contralateral LE stepping forwards and back over board in controlled manner to simulate gait sequence; transitioned to lateral weight shifting without UE support and focus on control and stopping weight shift prior to LOB.  Required min A and max verbal cues for all tasks.           PT Short Term Goals - 02/13/18 2045      PT SHORT TERM GOAL #1   Title  Pt will improve DGI to >/= 17/24 without AD    Baseline  13/24    Time  4    Period  Weeks    Status  Not Met      PT SHORT TERM GOAL #2   Title  Pt will decrease falls risk with community gait as indicated by increase in gait velocity with LRAD to > or = 2.6 ft/sec    Baseline  (3/5)1.77f/sec with RW and 2.04 without     Time  4    Period  Weeks    Status  Not Met      PT SHORT TERM GOAL #3   Title  Pt will decrease falls risk as indicated by increase in BERG balance score to > or = 50/56    Baseline  3/5 Berg 43/56    Time  4    Period  Weeks    Status  Not Met      PT SHORT TERM GOAL #4   Title  Pt will ambulate >230' on indoor surfaces without AD with supervision    Time  4    Period  Weeks    Status  Not Met        PT Long Term Goals - 01/15/18 1243      PT LONG TERM GOAL #1   Title  Pt and husband will demonstrate independence with HEP, including use of treadmill    Time  8    Period  Weeks    Status  Revised    Target Date  03/15/18      PT LONG TERM GOAL #2   Title  Pt will demonstrate improved balance and decreased falls risk as indicated by BERG balance score of > or = 52/56    Time  8    Period  Weeks    Status  Revised    Target Date  03/15/18      PT LONG TERM GOAL #3   Title  Pt will ambulate without AD x 250' over outdoor paved surfaces with supervision; will negotiate 4 stairs with one rail with alternating sequence and supervision    Time  8    Period  Weeks    Status  Revised    Target Date  03/15/18      PT LONG TERM GOAL #4   Title  Pt will report improvement in Neuro QOL-LE to >/= 40%    Baseline  35.8%    Time  8    Period  Weeks    Status  Revised    Target Date  03/15/18  PT LONG TERM GOAL #5   Title  Pt will decrease falls risk during gait in home/community as indicated by increase in gait velocity to > or = 3.0 ft/sec    Time  8     Period  Weeks    Status  Revised    Target Date  03/15/18      PT LONG TERM GOAL #6   Title  Pt will demonstrate decreased falls risk with gait as indicated by increase in DGI score to >/= 19/24 without AD    Time  8    Period  Weeks    Status  Revised    Target Date  03/15/18            Plan - 02/20/18 1639    Clinical Impression Statement  Treatment session focused on NMR on rockerboard in various directions and simulating gait sequence to improve pt control of ant/posterior weight shifting during sit <> stand and gait, proprioception and reorientation to midline.  Pt tolerated well with no increase in R pain.  Also discussed addition of Bioness for NMR and motor planning during gait.  Pt agreeable.    Rehab Potential  Good    PT Frequency  2x / week    PT Duration  8 weeks    PT Treatment/Interventions  ADLs/Self Care Home Management;Aquatic Therapy;DME Instruction;Gait training;Stair training;Functional mobility training;Therapeutic activities;Therapeutic exercise;Balance training;Neuromuscular re-education;Patient/family education;Vestibular;Visual/perceptual remediation/compensation;Cognitive remediation;Orthotic Fit/Training    PT Next Visit Plan  Trial Bioness on LLE - DF and hamstring during gait for motor planning/sequencing.  gait with increased input through pelvis (tactile or weight?), check on treadmill HEP with single and bilat LE on belt; weight shifting on rockerboard.  Corner balance and finding midline    Consulted and Agree with Plan of Care  Patient;Family member/caregiver    Family Member Consulted  husband       Patient will benefit from skilled therapeutic intervention in order to improve the following deficits and impairments:  Abnormal gait, Decreased balance, Decreased cognition, Decreased coordination, Decreased strength, Difficulty walking, Dizziness, Impaired sensation, Impaired vision/preception, Pain, Decreased activity tolerance, Decreased  mobility  Visit Diagnosis: Ataxic gait  Other lack of coordination  Other abnormalities of gait and mobility  Unsteadiness on feet  Dizziness and giddiness     Problem List Patient Active Problem List   Diagnosis Date Noted  . 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 Weakness with dizziness-  since [facility]H and CVA 04/07/17 08/07/2017  . Disturbances of vision, late effect of stroke 08/06/2017  . Health education/counseling 08/05/2017  . High risk medications (not anticoagulants) long-term use 08/05/2017  . Neuritis-  R sided:  arm and leg/ body due to stroke 08/05/2017  . Elevated LDL cholesterol level 07/11/2017  . National Institutes of Health (NIH) Stroke Scale limb ataxia score 2, ataxia present in two limbs 06/25/2017  . 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 Elevated vitamin B12 level 05/30/2017  . Vitamin D deficiency 05/29/2017  . History of tobacco abuse-  30pk yr hx - quit 04/07/17 05/21/2017  . Gait disturbance, post-stroke 05/14/2017  . Benign essential HTN   . Vitreous hemorrhage of right eye ([name])   . Adjustment disorder with mixed anxiety and depressed mood   . 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 s/p SAH (subarachnoid hemorrhage) ([name]) 04/19/2017  . Basilar artery aneurysm ([name])   . Hypoxia   . Subarachnoid hemorrhage   due to ruptured aneurysm ([place]) 04/07/2017  . CVA (cerebral vascular accident) ([place]) 04/07/2017  . Elevated gastrin level 01/25/2012  . Hypokalemia 01/21/2012  . Nausea & vomiting 01/20/2012  . Epigastric pain 01/20/2012  . Duodenal ulcer, acute with obstruction 10/17/2011  . S/P laparoscopic cholecystectomy 10/16/2011    Rico Junker, PT, DPT 02/20/18    4:51 PM    [facility] [address] [place], [place], [address] Phone: [phone]   Fax:  [phone]  Name: [name] Barnett MRN:  [id number] Date of Birth: 1976-08-06

## 2018-02-25 ENCOUNTER — Ambulatory Visit: Payer: BLUE CROSS/BLUE SHIELD | Admitting: Physical Therapy

## 2018-02-25 ENCOUNTER — Encounter: Payer: Self-pay | Admitting: Physical Therapy

## 2018-02-25 DIAGNOSIS — R2681 Unsteadiness on feet: Secondary | ICD-10-CM

## 2018-02-25 DIAGNOSIS — R2689 Other abnormalities of gait and mobility: Secondary | ICD-10-CM

## 2018-02-25 DIAGNOSIS — I69054 Hemiplegia and hemiparesis following nontraumatic subarachnoid hemorrhage affecting left non-dominant side: Secondary | ICD-10-CM | POA: Diagnosis not present

## 2018-02-25 DIAGNOSIS — R278 Other lack of coordination: Secondary | ICD-10-CM

## 2018-02-25 DIAGNOSIS — R260 Ataxic gait: Secondary | ICD-10-CM

## 2018-02-25 NOTE — Therapy (Signed)
[facility] [address] [place], [place], [address] Phone: [phone]   Fax:  [phone]  Physical Therapy Treatment  Patient Details  Name: AMRUTHA AVERA MRN: [id number] Date of Birth: 03/28/1976 Referring Provider: Charlett Blake, MD   Encounter Date: 02/25/2018  PT End of Session - 02/25/18 1452    Visit Number  2    Number of Visits  62    Date for PT Re-Evaluation  03/15/18    Authorization Type  BCBS    PT Start Time  1448    PT Stop Time  1530    PT Time Calculation (min)  42 min    Equipment Utilized During Treatment  Gait belt;Other (comment) Bioness    Activity Tolerance  Patient tolerated treatment well    Behavior During Therapy  WFL for tasks assessed/performed       Past Medical History:  Diagnosis Date  . Duodenal obstruction   . GERD (gastroesophageal reflux disease)   . Hypertension   . Stroke [facility])    april 2018    Past Surgical History:  Procedure Laterality Date  . ABDOMINAL HYSTERECTOMY    . BALLOON DILATION  12/31/2011   Procedure: BALLOON DILATION;  Surgeon: Missy Sabins, MD;  Location: [facility] ENDOSCOPY;  Service: Endoscopy;  Laterality: N/A;  . CHOLECYSTECTOMY  07/28/11  . ESOPHAGOGASTRODUODENOSCOPY  10/17/2011   Procedure: ESOPHAGOGASTRODUODENOSCOPY (EGD);  Surgeon: Missy Sabins, MD;  Location: [facility] ENDOSCOPY;  Service: Endoscopy;  Laterality: N/A;  . IR ANGIO INTRA EXTRACRAN SEL INTERNAL CAROTID BILAT MOD SED  04/07/2017  . IR ANGIO INTRA EXTRACRAN SEL INTERNAL CAROTID BILAT MOD SED  01/07/2018  . IR ANGIO VERTEBRAL SEL VERTEBRAL UNI R MOD SED  04/07/2017  . IR ANGIO VERTEBRAL SEL VERTEBRAL UNI R MOD SED  01/07/2018  . IR ANGIOGRAM FOLLOW UP STUDY  04/07/2017  . IR ANGIOGRAM FOLLOW UP STUDY  04/07/2017  . IR ANGIOGRAM FOLLOW UP STUDY  04/07/2017  . IR ANGIOGRAM FOLLOW UP STUDY  04/07/2017  . IR ANGIOGRAM FOLLOW UP STUDY  04/07/2017  . IR ANGIOGRAM SELECTIVE EACH ADDITIONAL VESSEL   04/07/2017  . IR TRANSCATH/EMBOLIZ  04/07/2017  . IR US GUIDE VASC ACCESS RIGHT  01/07/2018  . PARS PLANA VITRECTOMY Right 05/01/2017   Procedure: PARS PLANA VITRECTOMY WITH 25 GAUGE RIGHT EYE, endolaser photocoaglation;  Surgeon: Jalene Mullet, MD;  Location: [place];  Service: Ophthalmology;  Laterality: Right;  . RADIOLOGY WITH ANESTHESIA N/A 04/07/2017   Procedure: RADIOLOGY WITH ANESTHESIA;  Surgeon: Consuella Lose, MD;  Location: [place];  Service: Radiology;  Laterality: N/A;  . REPLACEMENT TOTAL KNEE BILATERAL  2004  . TEMPOROMANDIBULAR JOINT SURGERY     2 surgeries  . TUMOR REMOVAL    . VAGOTOMY  01/23/2012   Procedure: VAGOTOMY, antrectomy and BII;  Surgeon: Haywood Lasso, MD;  Location: [place];  Service: General;  Laterality: N/A;  Laparotomy with vagotomy.    There were no vitals filed for this visit.  Subjective Assessment - 02/25/18 1448    Subjective  No new complaints. No falls. Reports the treadmill is now "kicking my ass". Using sticky notes to remind her of what to do- 5 minutes both legs, rest standing up tall, single leg heel-toe for 5 mintues, rest upright, repeat with other single leg x 5 mintues, rest upright, end with bil LEs x 5 mintues, then walk on even ground before resting.     Patient is accompained by:  --  Limitations  Standing;Walking    Currently in Pain?  Yes    Pain Score  3     Pain Location  Leg    Pain Orientation  Right    Pain Descriptors / Indicators  Aching    Pain Type  Acute pain    Pain Radiating Towards  thigh down to knee/calf area    Pain Onset  More than a month ago    Pain Frequency  Constant    Aggravating Factors   sitting still    Pain Relieving Factors  standing up/moving           [facility] Adult PT Treatment/Exercise - 02/25/18 1453      Transfers   Transfers  Sit to Stand;Stand to Sit    Sit to Stand  4: Min guard;With upper extremity assist;From bed;From chair/3-in-1    Sit to Stand Details  Verbal cues for  technique;Verbal cues for precautions/safety;Verbal cues for gait pattern    Stand to Sit  4: Min guard;To bed;To chair/3-in-1;With upper extremity assist    Stand to Sit Details (indicate cue type and reason)  Verbal cues for technique;Verbal cues for precautions/safety;Verbal cues for sequencing      Ambulation/Gait   Ambulation/Gait  Yes    Ambulation/Gait Assistance  4: Min guard;4: Min assist    Ambulation/Gait Assistance Details  use of Bioness concurrently with gait with continued cues needed on posture and step length. manual stabilization provided at hips/pelvis with overpressure to attempt to decrease ataxic movements of trunk/pelvis (would benefit from use of a weight belt or vest). pt did demo improved foot clearance and knee flexion with use of Bioness today (does have a decreased tolerance to stimulation on lower cuff and could benefit from increased intensity for improve mm contraction as she tolerates).    Ambulation Distance (Feet)  115 Feet x2 with RW, x1 with no AD    Assistive device  Rolling walker;None    Gait Pattern  Step-through pattern;Decreased step length - right;Decreased step length - left;Decreased stance time - left;Decreased stride length;Decreased hip/knee flexion - left;Ataxic;Decreased trunk rotation;Decreased weight shift to right    Ambulation Surface  Level;Indoor      Acupuncturist Location  left anterior tibialis, left hamstring     Electrical Stimulation Action  for improved DF assist with swing phase, for improved knee flexion with swing phase and for NMR of these mm groups    Electrical Stimulation Parameters  refer to tablet 1 for adjusted parameters, quickfit electrodes    Electrical Stimulation Goals  Strength;Neuromuscular facilitation          PT Short Term Goals - 02/13/18 2045      PT SHORT TERM GOAL #1   Title  Pt will improve DGI to >/= 17/24 without AD    Baseline  13/24    Time  4    Period  Weeks     Status  Not Met      PT SHORT TERM GOAL #2   Title  Pt will decrease falls risk with community gait as indicated by increase in gait velocity with LRAD to > or = 2.6 ft/sec    Baseline  (3/5)1.75f/sec with RW and 2.04 without     Time  4    Period  Weeks    Status  Not Met      PT SHORT TERM GOAL #3   Title  Pt will decrease falls risk as indicated by increase  in BERG balance score to > or = 50/56    Baseline  3/5 Berg 43/56    Time  4    Period  Weeks    Status  Not Met      PT SHORT TERM GOAL #4   Title  Pt will ambulate >230' on indoor surfaces without AD with supervision    Time  4    Period  Weeks    Status  Not Met        PT Long Term Goals - 01/15/18 1243      PT LONG TERM GOAL #1   Title  Pt and husband will demonstrate independence with HEP, including use of treadmill    Time  8    Period  Weeks    Status  Revised    Target Date  03/15/18      PT LONG TERM GOAL #2   Title  Pt will demonstrate improved balance and decreased falls risk as indicated by BERG balance score of > or = 52/56    Time  8    Period  Weeks    Status  Revised    Target Date  03/15/18      PT LONG TERM GOAL #3   Title  Pt will ambulate without AD x 250' over outdoor paved surfaces with supervision; will negotiate 4 stairs with one rail with alternating sequence and supervision    Time  8    Period  Weeks    Status  Revised    Target Date  03/15/18      PT LONG TERM GOAL #4   Title  Pt will report improvement in Neuro QOL-LE to >/= 40%    Baseline  35.8%    Time  8    Period  Weeks    Status  Revised    Target Date  03/15/18      PT LONG TERM GOAL #5   Title  Pt will decrease falls risk during gait in home/community as indicated by increase in gait velocity to > or = 3.0 ft/sec    Time  8    Period  Weeks    Status  Revised    Target Date  03/15/18      PT LONG TERM GOAL #6   Title  Pt will demonstrate decreased falls risk with gait as indicated by increase in DGI score  to >/= 19/24 without AD    Time  8    Period  Weeks    Status  Revised    Target Date  03/15/18          Plan - 02/25/18 1452    Clinical Impression Statement  Today's skilled session focused on establishment of Bioness to left LE for use with gait, strengthening and balance/NMR. Pt did demonstrate improved foot clearacne, heel contact and knee flexion with swing phase once her individual parameters were established. Pt does have decreased toleracne to the lower cuff stimualtion, could benefit from increased stimulation to achieve improved mm activation as her tolerance increases. Pt is progressing toward goals and should benefit from continued PT to progress toward unmet goals.                          Rehab Potential  Good    PT Frequency  2x / week    PT Duration  8 weeks    PT Treatment/Interventions  ADLs/Self Care Home Management;Aquatic Therapy;DME Instruction;Gait  training;Stair training;Functional mobility training;Therapeutic activities;Therapeutic exercise;Balance training;Neuromuscular re-education;Patient/family education;Vestibular;Visual/perceptual remediation/compensation;Cognitive remediation;Orthotic Fit/Training    PT Next Visit Plan  continue with use of Bioness to left LE: gait on treamill both single and double LE's, gait with increased input through pelvis (tactile or weight?),; weight shifting on rockerboard.  Corner balance and finding midline    Consulted and Agree with Plan of Care  Patient    Family Member Consulted  --       Patient will benefit from skilled therapeutic intervention in order to improve the following deficits and impairments:  Abnormal gait, Decreased balance, Decreased cognition, Decreased coordination, Decreased strength, Difficulty walking, Dizziness, Impaired sensation, Impaired vision/preception, Pain, Decreased activity tolerance, Decreased mobility  Visit Diagnosis: Other lack of coordination  Other abnormalities of gait and  mobility  Unsteadiness on feet  Ataxic gait     Problem List Patient Active Problem List   Diagnosis Date Noted  . 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 Weakness with dizziness-  since [facility] and CVA 04/07/17 08/07/2017  . Disturbances of vision, late effect of stroke 08/06/2017  . Health education/counseling 08/05/2017  . High risk medications (not anticoagulants) long-term use 08/05/2017  . Neuritis-  R sided:  arm and leg/ body due to stroke 08/05/2017  . Elevated LDL cholesterol level 07/11/2017  . [employer] (NIH) Stroke Scale limb ataxia score 2, ataxia present in two limbs 06/25/2017  . 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 Elevated vitamin B12 level 05/30/2017  . Vitamin D deficiency 05/29/2017  . History of tobacco abuse-  30pk yr hx - quit 04/07/17 05/21/2017  . Gait disturbance, post-stroke 05/14/2017  . Benign essential HTN   . Vitreous hemorrhage of right eye ([place])   . Adjustment disorder with mixed anxiety and depressed mood   . 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 Terson syndrome of both eyes ([place]) 04/23/2017  . s/p SAH (subarachnoid hemorrhage) ([place]) 04/19/2017  . Basilar artery aneurysm ([place])   . Hypoxia   . Subarachnoid hemorrhage due to ruptured aneurysm ([place]) 04/07/2017  . CVA (cerebral vascular accident) ([place]) 04/07/2017  . Elevated gastrin level 01/25/2012  . Hypokalemia 01/21/2012  . Nausea & vomiting 01/20/2012  . Epigastric pain 01/20/2012  . Duodenal ulcer, acute with obstruction 10/17/2011  . S/P laparoscopic cholecystectomy 10/16/2011    Willow Ora, PTA, [facility] [address] [place], [place] [address] [phone] 02/26/18, 1:01 PM   Name: YOLETTE HASTINGS MRN: [id number] Date of Birth: 06/04/1976

## 2018-02-27 ENCOUNTER — Encounter: Payer: Self-pay | Admitting: Physical Therapy

## 2018-02-27 ENCOUNTER — Ambulatory Visit: Payer: BLUE CROSS/BLUE SHIELD | Admitting: Physical Therapy

## 2018-02-27 VITALS — BP 150/110 | HR 75

## 2018-02-27 DIAGNOSIS — R278 Other lack of coordination: Secondary | ICD-10-CM

## 2018-02-27 DIAGNOSIS — R2689 Other abnormalities of gait and mobility: Secondary | ICD-10-CM

## 2018-02-27 DIAGNOSIS — R2681 Unsteadiness on feet: Secondary | ICD-10-CM

## 2018-02-27 DIAGNOSIS — R260 Ataxic gait: Secondary | ICD-10-CM

## 2018-02-27 NOTE — Progress Notes (Signed)
Sounds good.  Thank you for letting me know.  Hopefully she contacts me.  - Since I have known her, even though a very short period, her blood pressures are really a little all over the place.    Unfortunately I have not heard from her specialists, or gotten any direction from them as to what goal blood pressure they want her to stay at due to the neurological insult she suffered.

## 2018-02-27 NOTE — Therapy (Signed)
[facility] [address] [place], [place], [address] Phone: [phone]   Fax:  [phone]  Physical Therapy Treatment  Patient Details  Name: [name] Barnett MRN: [id number] Date of Birth: 10/03/1976 Referring Provider: Erick Colace, MD   Encounter Date: 02/27/2018  PT End of Session - 02/27/18 1520    Visit Number  -- no charge for today    Number of Visits  62    Date for PT Re-Evaluation  03/15/18    Authorization Type  BCBS    Equipment Utilized During Treatment  Gait belt;Other (comment) Bioness    Activity Tolerance  Patient tolerated treatment well    Behavior During Therapy  WFL for tasks assessed/performed       Past Medical History:  Diagnosis Date  . Duodenal obstruction   . GERD (gastroesophageal reflux disease)   . Hypertension   . Stroke [facility])    april 2018    Past Surgical History:  Procedure Laterality Date  . ABDOMINAL HYSTERECTOMY    . BALLOON DILATION  12/31/2011   Procedure: BALLOON DILATION;  Surgeon: Barrie Folk, MD;  Location: [facility] ENDOSCOPY;  Service: Endoscopy;  Laterality: N/A;  . CHOLECYSTECTOMY  07/28/11  . ESOPHAGOGASTRODUODENOSCOPY  10/17/2011   Procedure: ESOPHAGOGASTRODUODENOSCOPY (EGD);  Surgeon: Barrie Folk, MD;  Location: [facility] ENDOSCOPY;  Service: Endoscopy;  Laterality: N/A;  . IR ANGIO INTRA EXTRACRAN SEL INTERNAL CAROTID BILAT MOD SED  04/07/2017  . IR ANGIO INTRA EXTRACRAN SEL INTERNAL CAROTID BILAT MOD SED  01/07/2018  . IR ANGIO VERTEBRAL SEL VERTEBRAL UNI R MOD SED  04/07/2017  . IR ANGIO VERTEBRAL SEL VERTEBRAL UNI R MOD SED  01/07/2018  . IR ANGIOGRAM FOLLOW UP STUDY  04/07/2017  . IR ANGIOGRAM FOLLOW UP STUDY  04/07/2017  . IR ANGIOGRAM FOLLOW UP STUDY  04/07/2017  . IR ANGIOGRAM FOLLOW UP STUDY  04/07/2017  . IR ANGIOGRAM FOLLOW UP STUDY  04/07/2017  . IR ANGIOGRAM SELECTIVE EACH ADDITIONAL VESSEL  04/07/2017  . IR TRANSCATH/EMBOLIZ  04/07/2017  . IR US GUIDE VASC  ACCESS RIGHT  01/07/2018  . PARS PLANA VITRECTOMY Right 05/01/2017   Procedure: PARS PLANA VITRECTOMY WITH 25 GAUGE RIGHT EYE, endolaser photocoaglation;  Surgeon: Carmela Rima, MD;  Location: [facility] OR;  Service: Ophthalmology;  Laterality: Right;  . RADIOLOGY WITH ANESTHESIA N/A 04/07/2017   Procedure: RADIOLOGY WITH ANESTHESIA;  Surgeon: Lisbeth Renshaw, MD;  Location: [facility] OR;  Service: Radiology;  Laterality: N/A;  . REPLACEMENT TOTAL KNEE BILATERAL  2004  . TEMPOROMANDIBULAR JOINT SURGERY     2 surgeries  . TUMOR REMOVAL    . VAGOTOMY  01/23/2012   Procedure: VAGOTOMY, antrectomy and BII;  Surgeon: Currie Paris, MD;  Location: [facility] OR;  Service: General;  Laterality: N/A;  Laparotomy with vagotomy.    Vitals:   02/27/18 1458  BP: (!) 150/110  Pulse: 75  SpO2: 99%    Subjective Assessment - 02/27/18 1458    Subjective  Pt reports "I've gotten lazy"; has not performed treadmill this week.  Feeling good today; had curly fries for lunch.    Limitations  Standing;Walking    Currently in Pain?  Yes    Pain Score  3     Pain Location  Leg    Pain Orientation  Right    Pain Descriptors / Indicators  Aching    Pain Type  Neuropathic pain    Pain Onset  More than a month ago  Plan - 02/27/18 1521    Clinical Impression Statement  Assessed pt's vitals at beginning of session today with significantly elevated systolic and diastolic readings.  Pt reports eating curly fries for lunch.  Discussed relationship between a high sodium diet and HTN and increased risk for stroke.  Offered to provide pt with information on low sodium options but pt stated, "Not to be funny, but I'm going to eat what I want to eat."  Husband also reports they have decreased some of her BP medications which helps pt not feel dizzy but may be causing her BP to elevate.  Encouraged pt to contact MD to discuss alterations in medication.  Unable to continue with therapy today due to BP elevated above safe level.   Will re-assess BP and ability to tolerate next week.    Rehab Potential  Good    PT Frequency  2x / week    PT Duration  8 weeks    PT Treatment/Interventions  ADLs/Self Care Home Management;Aquatic Therapy;DME Instruction;Gait training;Stair training;Functional mobility training;Therapeutic activities;Therapeutic exercise;Balance training;Neuromuscular re-education;Patient/family education;Vestibular;Visual/perceptual remediation/compensation;Cognitive remediation;Orthotic Fit/Training    PT Next Visit Plan  Check BP-was very elevated last session!  Have patient schedule more visits; she left without scheduling last visit: 2x/week x 8 more weeks with AP/EP/RW.  continue with use of Bioness to left LE: gait on treamill both single and double LE's, gait with increased input through pelvis (tactile or weight?),; weight shifting on rockerboard.  Corner balance and finding midline    Consulted and Agree with Plan of Care  Patient       Patient will benefit from skilled therapeutic intervention in order to improve the following deficits and impairments:  Abnormal gait, Decreased balance, Decreased cognition, Decreased coordination, Decreased strength, Difficulty walking, Dizziness, Impaired sensation, Impaired vision/preception, Pain, Decreased activity tolerance, Decreased mobility  Visit Diagnosis: Ataxic gait  Unsteadiness on feet  Other abnormalities of gait and mobility  Other lack of coordination     Problem List Patient Active Problem List   Diagnosis Date Noted  . 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 Weakness with dizziness-  since [facility] and CVA 04/07/17 08/07/2017  . Disturbances of vision, late effect of stroke 08/06/2017  . Health education/counseling 08/05/2017  . High risk medications (not anticoagulants) long-term use 08/05/2017  . Neuritis-  R sided:  arm and leg/ body due to stroke 08/05/2017  . Elevated LDL cholesterol level 07/11/2017  . [employer] (NIH)  Stroke Scale limb ataxia score 2, ataxia present in two limbs 06/25/2017  . 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 Elevated vitamin B12 level 05/30/2017  . Vitamin D deficiency 05/29/2017  . History of tobacco abuse-  30pk yr hx - quit 04/07/17 05/21/2017  . Gait disturbance, post-stroke 05/14/2017  . Benign essential HTN   . Vitreous hemorrhage of right eye ([name])   . Adjustment disorder with mixed anxiety and depressed mood   . 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 s/p SAH (subarachnoid hemorrhage) ([name]) 04/19/2017  . Basilar artery aneurysm ([name])   . Hypoxia   . 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 CVA (cerebral vascular accident) ([name]) 04/07/2017  . Elevated gastrin level 01/25/2012  . Hypokalemia 01/21/2012  . Nausea & vomiting 01/20/2012  . Epigastric pain 01/20/2012  . Duodenal ulcer, acute with obstruction 10/17/2011  . S/P laparoscopic cholecystectomy 10/16/2011   Dierdre Highman,  PT, DPT 02/27/18    3:30 PM    [facility] [address] [place], [place], [address] Phone: [phone]   Fax:  [phone]  Name: [name] Barnett MRN: [id number] Date of Birth: 07/28/1976

## 2018-03-04 ENCOUNTER — Ambulatory Visit: Payer: BLUE CROSS/BLUE SHIELD | Admitting: Occupational Therapy

## 2018-03-04 ENCOUNTER — Encounter: Payer: Self-pay | Admitting: Occupational Therapy

## 2018-03-04 ENCOUNTER — Encounter: Payer: Self-pay | Admitting: Physical Therapy

## 2018-03-04 ENCOUNTER — Ambulatory Visit: Payer: BLUE CROSS/BLUE SHIELD | Admitting: Physical Therapy

## 2018-03-04 VITALS — BP 116/84 | HR 75

## 2018-03-04 VITALS — BP 129/89

## 2018-03-04 DIAGNOSIS — I69054 Hemiplegia and hemiparesis following nontraumatic subarachnoid hemorrhage affecting left non-dominant side: Secondary | ICD-10-CM | POA: Diagnosis not present

## 2018-03-04 DIAGNOSIS — R260 Ataxic gait: Secondary | ICD-10-CM

## 2018-03-04 DIAGNOSIS — R2689 Other abnormalities of gait and mobility: Secondary | ICD-10-CM

## 2018-03-04 DIAGNOSIS — R41840 Attention and concentration deficit: Secondary | ICD-10-CM

## 2018-03-04 DIAGNOSIS — R2681 Unsteadiness on feet: Secondary | ICD-10-CM

## 2018-03-04 DIAGNOSIS — R208 Other disturbances of skin sensation: Secondary | ICD-10-CM

## 2018-03-04 DIAGNOSIS — R278 Other lack of coordination: Secondary | ICD-10-CM

## 2018-03-04 DIAGNOSIS — R41842 Visuospatial deficit: Secondary | ICD-10-CM

## 2018-03-04 NOTE — Therapy (Signed)
[facility] [address] [place], [place], [address] Phone: [phone]   Fax:  [phone]  Physical Therapy Treatment  Patient Details  Name: [name] Barnett MRN: [id number] Date of Birth: 01-Jun-1976 Referring Provider: Charlett Blake, MD   Encounter Date: 03/04/2018  PT End of Session - 03/04/18 1704    Visit Number  58    Number of Visits  62    Date for PT Re-Evaluation  03/15/18    Authorization Type  BCBS    PT Start Time  7026    PT Stop Time  1530    PT Time Calculation (min)  45 min    Equipment Utilized During Treatment  Gait belt;Other (comment) Bioness    Activity Tolerance  Patient tolerated treatment well    Behavior During Therapy  WFL for tasks assessed/performed       Past Medical History:  Diagnosis Date  . Duodenal obstruction   . GERD (gastroesophageal reflux disease)   . Hypertension   . Stroke [facility])    april 2018    Past Surgical History:  Procedure Laterality Date  . ABDOMINAL HYSTERECTOMY    . BALLOON DILATION  12/31/2011   Procedure: BALLOON DILATION;  Surgeon: Missy Sabins, MD;  Location: [facility] ENDOSCOPY;  Service: Endoscopy;  Laterality: N/A;  . CHOLECYSTECTOMY  07/28/11  . ESOPHAGOGASTRODUODENOSCOPY  10/17/2011   Procedure: ESOPHAGOGASTRODUODENOSCOPY (EGD);  Surgeon: Missy Sabins, MD;  Location: [facility] ENDOSCOPY;  Service: Endoscopy;  Laterality: N/A;  . IR ANGIO INTRA EXTRACRAN SEL INTERNAL CAROTID BILAT MOD SED  04/07/2017  . IR ANGIO INTRA EXTRACRAN SEL INTERNAL CAROTID BILAT MOD SED  01/07/2018  . IR ANGIO VERTEBRAL SEL VERTEBRAL UNI R MOD SED  04/07/2017  . IR ANGIO VERTEBRAL SEL VERTEBRAL UNI R MOD SED  01/07/2018  . IR ANGIOGRAM FOLLOW UP STUDY  04/07/2017  . IR ANGIOGRAM FOLLOW UP STUDY  04/07/2017  . IR ANGIOGRAM FOLLOW UP STUDY  04/07/2017  . IR ANGIOGRAM FOLLOW UP STUDY  04/07/2017  . IR ANGIOGRAM FOLLOW UP STUDY  04/07/2017  . IR ANGIOGRAM SELECTIVE EACH ADDITIONAL VESSEL   04/07/2017  . IR TRANSCATH/EMBOLIZ  04/07/2017  . IR US GUIDE VASC ACCESS RIGHT  01/07/2018  . PARS PLANA VITRECTOMY Right 05/01/2017   Procedure: PARS PLANA VITRECTOMY WITH 25 GAUGE RIGHT EYE, endolaser photocoaglation;  Surgeon: Jalene Mullet, MD;  Location: [place];  Service: Ophthalmology;  Laterality: Right;  . RADIOLOGY WITH ANESTHESIA N/A 04/07/2017   Procedure: RADIOLOGY WITH ANESTHESIA;  Surgeon: Consuella Lose, MD;  Location: [place];  Service: Radiology;  Laterality: N/A;  . REPLACEMENT TOTAL KNEE BILATERAL  2004  . TEMPOROMANDIBULAR JOINT SURGERY     2 surgeries  . TUMOR REMOVAL    . VAGOTOMY  01/23/2012   Procedure: VAGOTOMY, antrectomy and BII;  Surgeon: Haywood Lasso, MD;  Location: [place];  Service: General;  Laterality: N/A;  Laparotomy with vagotomy.    Vitals:   03/04/18 1453 03/04/18 1459  BP: (!) 132/95 116/84  Pulse:  75    Subjective Assessment - 03/04/18 1452    Subjective  No falls. She has been doing the treadmill and her legs are sore but not bad.     Patient is accompained by:  Family member    Limitations  Standing;Walking    Currently in Pain?  No/denies                No data recorded  [place] Adult PT Treatment/Exercise - 03/04/18 1445      Transfers   Transfers  Sit to Stand;Stand to Sit    Sit to Stand  4: Min guard;With upper extremity assist;From bed;From chair/3-in-1    Sit to Stand Details  Verbal cues for technique;Verbal cues for precautions/safety;Verbal cues for gait pattern    Stand to Sit  4: Min guard;To bed;To chair/3-in-1;With upper extremity assist    Stand to Sit Details (indicate cue type and reason)  Verbal cues for technique;Verbal cues for precautions/safety;Verbal cues for sequencing      Ambulation/Gait   Ambulation/Gait  Yes    Ambulation/Gait Assistance  4: Min guard;4: Min assist    Ambulation Distance (Feet)  300 Feet 100' RW, 300' no device minA    Assistive device  Rolling walker;None;Other  (Comment) Bioness lower leg & thigh cuff    Gait Pattern  Step-through pattern;Decreased step length - right;Decreased step length - left;Decreased stance time - left;Decreased stride length;Decreased hip/knee flexion - left;Ataxic;Decreased trunk rotation;Decreased weight shift to right    Ambulation Surface  Indoor;Level    Ramp  3: Mod assist;4: Min assist MinA ascend & mod A descend 2 people for safety,    Ramp Details (indicate cue type and reason)  No assistive device, Bioness. Tactile & verbal cues on upright posture & weight shift.     Curb  3: Mod assist 2 people for safety, no assistive device, Bioness    Curb Details (indicate cue type and reason)  PT demo, verbal & tactile cues on step thru & balance reaction. 1 rep leading RLE & 1 rep leading LLE      High Level Balance   High Level Balance Activities  Side stepping;Negotiating over obstacles counter support & hand hold    High Level Balance Comments  side stepping with BUE on counter & stepping over lower hurdles with single UE on counter & other UE hand hold. Verbal & tactile cues for balance reactions.  Bioness assisting motion.       Acupuncturist Location  left anterior tibialis, left hamstring     Electrical Stimulation Action  Improved dorsiflexion /clearance in swing & hamstring for control in stance & clearance in swing    Electrical Stimulation Parameters  refer to tablet 1 for parameters    Electrical Stimulation Goals  Strength;Neuromuscular facilitation               PT Short Term Goals - 02/13/18 2045      PT SHORT TERM GOAL #1   Title  Pt will improve DGI to >/= 17/24 without AD    Baseline  13/24    Time  4    Period  Weeks    Status  Not Met      PT SHORT TERM GOAL #2   Title  Pt will decrease falls risk with community gait as indicated by increase in gait velocity with LRAD to > or = 2.6 ft/sec    Baseline  (3/5)1.72f/sec with RW and 2.04 without     Time  4     Period  Weeks    Status  Not Met      PT SHORT TERM GOAL #3   Title  Pt will decrease falls risk as indicated by increase in BERG balance score to > or = 50/56    Baseline  3/5 Berg 43/56    Time  4    Period  Weeks  Status  Not Met      PT SHORT TERM GOAL #4   Title  Pt will ambulate >230' on indoor surfaces without AD with supervision    Time  4    Period  Weeks    Status  Not Met        PT Long Term Goals - 01/15/18 1243      PT LONG TERM GOAL #1   Title  Pt and husband will demonstrate independence with HEP, including use of treadmill    Time  8    Period  Weeks    Status  Revised    Target Date  03/15/18      PT LONG TERM GOAL #2   Title  Pt will demonstrate improved balance and decreased falls risk as indicated by BERG balance score of > or = 52/56    Time  8    Period  Weeks    Status  Revised    Target Date  03/15/18      PT LONG TERM GOAL #3   Title  Pt will ambulate without AD x 250' over outdoor paved surfaces with supervision; will negotiate 4 stairs with one rail with alternating sequence and supervision    Time  8    Period  Weeks    Status  Revised    Target Date  03/15/18      PT LONG TERM GOAL #4   Title  Pt will report improvement in Neuro QOL-LE to >/= 40%    Baseline  35.8%    Time  8    Period  Weeks    Status  Revised    Target Date  03/15/18      PT LONG TERM GOAL #5   Title  Pt will decrease falls risk during gait in home/community as indicated by increase in gait velocity to > or = 3.0 ft/sec    Time  8    Period  Weeks    Status  Revised    Target Date  03/15/18      PT LONG TERM GOAL #6   Title  Pt will demonstrate decreased falls risk with gait as indicated by increase in DGI score to >/= 19/24 without AD    Time  8    Period  Weeks    Status  Revised    Target Date  03/15/18            Plan - 03/04/18 1704    Clinical Impression Statement  Patient was able to use visual relaxation/guided imagery to help lower BP.  Patient responsed to Bioness stimulation for gait to improve foot clearance with DF & knee flexion.     Rehab Potential  Good    PT Frequency  2x / week    PT Duration  8 weeks    PT Treatment/Interventions  ADLs/Self Care Home Management;Aquatic Therapy;DME Instruction;Gait training;Stair training;Functional mobility training;Therapeutic activities;Therapeutic exercise;Balance training;Neuromuscular re-education;Patient/family education;Vestibular;Visual/perceptual remediation/compensation;Cognitive remediation;Orthotic Fit/Training    PT Next Visit Plan  Check BP  continue with use of Bioness to left LE: gait on treamill both single and double LE's, gait with increased input through pelvis (tactile or weight?),; weight shifting on rockerboard.  Corner balance and finding midline    Consulted and Agree with Plan of Care  Patient       Patient will benefit from skilled therapeutic intervention in order to improve the following deficits and impairments:  Abnormal gait, Decreased balance, Decreased cognition, Decreased coordination,  Decreased strength, Difficulty walking, Dizziness, Impaired sensation, Impaired vision/preception, Pain, Decreased activity tolerance, Decreased mobility  Visit Diagnosis: Ataxic gait  Unsteadiness on feet  Other abnormalities of gait and mobility     Problem List Patient Active Problem List   Diagnosis Date Noted  . 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 Weakness with dizziness-  since [facility] and CVA 04/07/17 08/07/2017  . Disturbances of vision, late effect of stroke 08/06/2017  . Health education/counseling 08/05/2017  . High risk medications (not anticoagulants) long-term use 08/05/2017  . Neuritis-  R sided:  arm and leg/ body due to stroke 08/05/2017  . Elevated LDL cholesterol level 07/11/2017  . [employer] (NIH) Stroke Scale limb ataxia score 2, ataxia present in two limbs 06/25/2017  . Alteration of sensation as late effect of stroke  06/25/2017  . Elevated vitamin B12 level 05/30/2017  . Vitamin D deficiency 05/29/2017  . History of tobacco abuse-  30pk yr hx - quit 04/07/17 05/21/2017  . Gait disturbance, post-stroke 05/14/2017  . Benign essential HTN   . Vitreous hemorrhage of right eye ([place])   . Adjustment disorder with mixed anxiety and depressed mood   . 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 Terson syndrome of both eyes ([place]) 04/23/2017  . s/p SAH (subarachnoid hemorrhage) ([place]) 04/19/2017  . Basilar artery aneurysm ([place])   . Hypoxia   . Subarachnoid hemorrhage due to ruptured aneurysm ([place]) 04/07/2017  . CVA (cerebral vascular accident) ([place]) 04/07/2017  . Elevated gastrin level 01/25/2012  . Hypokalemia 01/21/2012  . Nausea & vomiting 01/20/2012  . Epigastric pain 01/20/2012  . Duodenal ulcer, acute with obstruction 10/17/2011  . S/P laparoscopic cholecystectomy 10/16/2011    Jamey Reas PT, DPT 03/04/2018, 5:08 PM  [facility] [address] [place], [place], [address] Phone: [phone]   Fax:  [phone]  Name: [name] Barnett MRN: [id number] Date of Birth: 24-Feb-1976

## 2018-03-04 NOTE — Patient Instructions (Signed)
Perform seated!!! If you attach to door make sure to attach to backside so the door does not pull and hit you!    Strengthening: Resisted Extension   Hold tubing in __both___ hand(s), arm forward. Pull arm back, elbow bent both hands. Repeat _10___ times per set. Do _1-2___ sessions per day, every other day.   Resisted Horizontal Abduction: Bilateral   Sit or stand, tubing in both hands, arms out in front. Keeping arms straight, pinch shoulder blades together and stretch arms out. Repeat _10___ times per set. Do _1-2___ sessions per day, every other day.   Elbow Flexion: Resisted   With tubing held in __left____ hand(s) and other end secured under foot, curl arm up as far as possible. Repeat _10___ times per set. Do _1-2___ sessions per day, every other day.

## 2018-03-04 NOTE — Therapy (Signed)
[facility] [address] [place], [place], [address] Phone: [phone]   Fax:  [phone]  Occupational Therapy Treatment  Patient Details  Name: [name] Barnett MRN: [id number] Date of Birth: [birth year] Referring Provider: Charlett Blake, MD   Encounter Date: 03/04/2018  OT End of Session - 03/04/18 1655    Visit Number  53    Number of Visits  33    Date for OT Re-Evaluation  04/05/18    Authorization Type  BCBS, no visit limit/auth req.    OT Start Time  1535    OT Stop Time  1615    OT Time Calculation (min)  40 min    Activity Tolerance  Patient tolerated treatment well    Behavior During Therapy  WFL for tasks assessed/performed       Past Medical History:  Diagnosis Date  . Duodenal obstruction   . GERD (gastroesophageal reflux disease)   . Hypertension   . Stroke [facility])    april 2018    Past Surgical History:  Procedure Laterality Date  . ABDOMINAL HYSTERECTOMY    . BALLOON DILATION  12/31/2011   Procedure: BALLOON DILATION;  Surgeon: Missy Sabins, MD;  Location: [facility] ENDOSCOPY;  Service: Endoscopy;  Laterality: N/A;  . CHOLECYSTECTOMY  07/28/11  . ESOPHAGOGASTRODUODENOSCOPY  10/17/2011   Procedure: ESOPHAGOGASTRODUODENOSCOPY (EGD);  Surgeon: Missy Sabins, MD;  Location: [facility] ENDOSCOPY;  Service: Endoscopy;  Laterality: N/A;  . IR ANGIO INTRA EXTRACRAN SEL INTERNAL CAROTID BILAT MOD SED  04/07/2017  . IR ANGIO INTRA EXTRACRAN SEL INTERNAL CAROTID BILAT MOD SED  01/07/2018  . IR ANGIO VERTEBRAL SEL VERTEBRAL UNI R MOD SED  04/07/2017  . IR ANGIO VERTEBRAL SEL VERTEBRAL UNI R MOD SED  01/07/2018  . IR ANGIOGRAM FOLLOW UP STUDY  04/07/2017  . IR ANGIOGRAM FOLLOW UP STUDY  04/07/2017  . IR ANGIOGRAM FOLLOW UP STUDY  04/07/2017  . IR ANGIOGRAM FOLLOW UP STUDY  04/07/2017  . IR ANGIOGRAM FOLLOW UP STUDY  04/07/2017  . IR ANGIOGRAM SELECTIVE EACH ADDITIONAL VESSEL  04/07/2017  . IR TRANSCATH/EMBOLIZ  04/07/2017  .  IR US GUIDE VASC ACCESS RIGHT  01/07/2018  . PARS PLANA VITRECTOMY Right 05/01/2017   Procedure: PARS PLANA VITRECTOMY WITH 25 GAUGE RIGHT EYE, endolaser photocoaglation;  Surgeon: Jalene Mullet, MD;  Location: [place];  Service: Ophthalmology;  Laterality: Right;  . RADIOLOGY WITH ANESTHESIA N/A 04/07/2017   Procedure: RADIOLOGY WITH ANESTHESIA;  Surgeon: Consuella Lose, MD;  Location: [place];  Service: Radiology;  Laterality: N/A;  . REPLACEMENT TOTAL KNEE BILATERAL  2004  . TEMPOROMANDIBULAR JOINT SURGERY     2 surgeries  . TUMOR REMOVAL    . VAGOTOMY  01/23/2012   Procedure: VAGOTOMY, antrectomy and BII;  Surgeon: Haywood Lasso, MD;  Location: [place];  Service: General;  Laterality: N/A;  Laparotomy with vagotomy.    Vitals:   03/04/18 1534  BP: 129/89    Subjective Assessment - 03/04/18 1654    Subjective   Pt reprots arm is moving better    Patient Stated Goals  be able to walk and use L arm              Treatment: Pt reports that she cooked cornbeef and cabbage this weekend. Pt reports cutting her left hand. therapsit recommneds pt does not cook without direct supervision/ assistance. Pt was adapted shown cutting board for increased safety. Supine closed chain shoulder flexion and chest press  with 4 lbs weight, shoulder flexion unilateral with 2 lbs, min-mod facilitation. Seated shoulder abduction, biceps curls and rowing with red band, min v.c/ demonstration.             OT Education - 03/04/18 1654    Education provided  Yes    Education Details  HEP review and updates    Person(s) Educated  Spouse;Patient    Methods  Explanation;Demonstration;Verbal cues;Handout    Comprehension  Verbalized understanding;Returned demonstration;Verbal cues required       OT Short Term Goals - 02/06/18 1646      OT SHORT TERM GOAL #2   Title  --      OT SHORT TERM GOAL #3   Title  --      OT SHORT TERM GOAL #4   Title  --      OT SHORT TERM GOAL #5   Title   --      OT SHORT TERM GOAL #6   Title  --      OT SHORT TERM GOAL #7   Status  -- inconsistent.  01/09/18 met at approx this level      OT SHORT TERM GOAL  #9   Baseline  27mn 55.44sec    Status  -- 2 mins 43 secs.  12/12/17:  met 155m 22.85sec        OT Long Term Goals - 03/04/18 1554      OT LONG TERM GOAL #8   Title  Pt will improve coordination for ADLs as shown by completing 9-hole peg test in less than 60sec.  (Pended)     Time  6  (Pended)     Period  Weeks  (Pended)     Status  On-going  (Pended)       OT LONG TERM GOAL  #9   Baseline  Pt will be independent with transitional movements during IADLs with no cueing for safety/impulsivity.  (Pended)     Time  6  (Pended)     Period  Weeks  (Pended)     Status  On-going  (Pended)       OT LONG TERM GOAL  #10   TITLE  Pt will be able to reach to retrieve lightweight object from overhead shelf consistently in 9/10 trials.  (Pended)     Time  6  (Pended)     Period  Weeks  (Pended)     Status  On-going  (Pended)       OT LONG TERM GOAL  #11   TITLE  Pt will be able to attend to functional task for at least 1520mwithout requiring redirection.  (Pended)     Time  6  (Pended)     Period  Weeks  (Pended)     Status  On-going  (Pended)       OT LONG TERM GOAL  #12   TITLE  I with updated HEP  (Pended)     Time  6  (Pended)     Period  Weeks  (Pended)     Status  Achieved  (Pended)             Plan - 03/04/18 1655    Clinical Impression Statement  Pt demonstrates improvements in LUE functional use and control. She reports exercising at home and using her hand more.    Rehab Potential  Good    Current Impairments/barriers affecting progress:  cognitive deficits, impulsivity    OT Frequency  --  6 visits    OT Duration  6 weeks    OT Treatment/Interventions  Self-care/ADL training;Therapeutic exercise;DME and/or AE instruction;Therapeutic activities;Cognitive remediation/compensation;Visual/perceptual  remediation/compensation;Passive range of motion;Functional Mobility Training;Neuromuscular education;Cryotherapy;Paraffin;Energy conservation;Manual Therapy;Patient/family education;Ultrasound;Balance training;Moist Heat;Fluidtherapy    Plan  check goals anticipate d/c next visit    Consulted and Agree with Plan of Care  Family member/caregiver;Patient    Family Member Consulted  husband       Patient will benefit from skilled therapeutic intervention in order to improve the following deficits and impairments:  Decreased coordination, Decreased range of motion, Difficulty walking, Abnormal gait, Decreased safety awareness, Impaired sensation, Decreased knowledge of precautions, Decreased balance, Decreased knowledge of use of DME, Impaired UE functional use, Decreased cognition, Decreased mobility, Decreased strength, Impaired vision/preception, Impaired perceived functional ability  Visit Diagnosis: Other lack of coordination  Other disturbances of skin sensation  Attention and concentration deficit  Visuospatial deficit    Problem List Patient Active Problem List   Diagnosis Date Noted  . 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 Weakness with dizziness-  since [facility] and CVA 04/07/17 08/07/2017  . Disturbances of vision, late effect of stroke 08/06/2017  . Health education/counseling 08/05/2017  . High risk medications (not anticoagulants) long-term use 08/05/2017  . Neuritis-  R sided:  arm and leg/ body due to stroke 08/05/2017  . Elevated LDL cholesterol level 07/11/2017  . [employer] (NIH) Stroke Scale limb ataxia score 2, ataxia present in two limbs 06/25/2017  . 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 Elevated vitamin B12 level 05/30/2017  . Vitamin D deficiency 05/29/2017  . History of tobacco abuse-  30pk yr hx - quit 04/07/17 05/21/2017  . Gait disturbance, post-stroke 05/14/2017  . Benign essential HTN   . Vitreous hemorrhage of right  eye ([place])   . Adjustment disorder with mixed anxiety and depressed mood   . 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 s/p SAH (subarachnoid hemorrhage) ([place]) 04/19/2017  . 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 Hypoxia   . Subarachnoid hemorrhage due to ruptured aneurysm ([place]) 04/07/2017  . CVA (cerebral vascular accident) ([place]) 04/07/2017  . Elevated gastrin level 01/25/2012  . Hypokalemia 01/21/2012  . Nausea & vomiting 01/20/2012  . Epigastric pain 01/20/2012  . Duodenal ulcer, acute with obstruction 10/17/2011  . S/P laparoscopic cholecystectomy 10/16/2011    RINE,KATHRYN 03/04/2018, 4:56 PM  [facility] [address] [place], [place], [address] Phone: [phone]   Fax:  [phone]  Name: ZYKERRIA TANTON MRN: [id number] Date of Birth: [birth year]

## 2018-03-06 ENCOUNTER — Ambulatory Visit: Payer: BLUE CROSS/BLUE SHIELD | Admitting: Physical Therapy

## 2018-03-06 ENCOUNTER — Ambulatory Visit: Payer: BLUE CROSS/BLUE SHIELD | Admitting: Occupational Therapy

## 2018-03-12 ENCOUNTER — Ambulatory Visit: Payer: BLUE CROSS/BLUE SHIELD | Attending: Physical Medicine & Rehabilitation | Admitting: Physical Therapy

## 2018-03-12 ENCOUNTER — Encounter: Payer: Self-pay | Admitting: Physical Therapy

## 2018-03-12 ENCOUNTER — Ambulatory Visit: Payer: BLUE CROSS/BLUE SHIELD | Admitting: Occupational Therapy

## 2018-03-12 VITALS — BP 108/84

## 2018-03-12 VITALS — BP 134/93 | HR 88

## 2018-03-12 DIAGNOSIS — R270 Ataxia, unspecified: Secondary | ICD-10-CM | POA: Insufficient documentation

## 2018-03-12 DIAGNOSIS — R414 Neurologic neglect syndrome: Secondary | ICD-10-CM | POA: Diagnosis present

## 2018-03-12 DIAGNOSIS — R208 Other disturbances of skin sensation: Secondary | ICD-10-CM | POA: Insufficient documentation

## 2018-03-12 DIAGNOSIS — R42 Dizziness and giddiness: Secondary | ICD-10-CM | POA: Diagnosis present

## 2018-03-12 DIAGNOSIS — R260 Ataxic gait: Secondary | ICD-10-CM | POA: Diagnosis present

## 2018-03-12 DIAGNOSIS — R278 Other lack of coordination: Secondary | ICD-10-CM

## 2018-03-12 DIAGNOSIS — R41842 Visuospatial deficit: Secondary | ICD-10-CM | POA: Diagnosis present

## 2018-03-12 DIAGNOSIS — R2681 Unsteadiness on feet: Secondary | ICD-10-CM | POA: Diagnosis present

## 2018-03-12 DIAGNOSIS — I69054 Hemiplegia and hemiparesis following nontraumatic subarachnoid hemorrhage affecting left non-dominant side: Secondary | ICD-10-CM

## 2018-03-12 DIAGNOSIS — R41840 Attention and concentration deficit: Secondary | ICD-10-CM | POA: Insufficient documentation

## 2018-03-12 DIAGNOSIS — R2689 Other abnormalities of gait and mobility: Secondary | ICD-10-CM | POA: Diagnosis present

## 2018-03-14 ENCOUNTER — Ambulatory Visit: Payer: BLUE CROSS/BLUE SHIELD | Admitting: Physical Therapy

## 2018-03-14 NOTE — Therapy (Signed)
[facility] [address] [place], [place], [address] Phone: [phone]   Fax:  [phone]  Physical Therapy Treatment  Patient Details  Name: [name] Barnett MRN: [id number] Date of Birth: 05/29/1976 Referring Provider: Charlett Blake, MD   Encounter Date: 03/12/2018   03/12/18 1407  PT Visits / Re-Eval  Visit Number 55  Number of Visits 62  Date for PT Re-Evaluation 03/15/18  Authorization  Authorization Type BCBS  PT Time Calculation  PT Start Time 4765  PT Stop Time 1445  PT Time Calculation (min) 42 min  PT - End of Session  Equipment Utilized During Treatment Gait belt  Activity Tolerance Patient tolerated treatment well  Behavior During Therapy WFL for tasks assessed/performed     Past Medical History:  Diagnosis Date  . Duodenal obstruction   . GERD (gastroesophageal reflux disease)   . Hypertension   . Stroke [facility])    april 2018    Past Surgical History:  Procedure Laterality Date  . ABDOMINAL HYSTERECTOMY    . BALLOON DILATION  12/31/2011   Procedure: BALLOON DILATION;  Surgeon: Missy Sabins, MD;  Location: [facility] ENDOSCOPY;  Service: Endoscopy;  Laterality: N/A;  . CHOLECYSTECTOMY  07/28/11  . ESOPHAGOGASTRODUODENOSCOPY  10/17/2011   Procedure: ESOPHAGOGASTRODUODENOSCOPY (EGD);  Surgeon: Missy Sabins, MD;  Location: [facility] ENDOSCOPY;  Service: Endoscopy;  Laterality: N/A;  . IR ANGIO INTRA EXTRACRAN SEL INTERNAL CAROTID BILAT MOD SED  04/07/2017  . IR ANGIO INTRA EXTRACRAN SEL INTERNAL CAROTID BILAT MOD SED  01/07/2018  . IR ANGIO VERTEBRAL SEL VERTEBRAL UNI R MOD SED  04/07/2017  . IR ANGIO VERTEBRAL SEL VERTEBRAL UNI R MOD SED  01/07/2018  . IR ANGIOGRAM FOLLOW UP STUDY  04/07/2017  . IR ANGIOGRAM FOLLOW UP STUDY  04/07/2017  . IR ANGIOGRAM FOLLOW UP STUDY  04/07/2017  . IR ANGIOGRAM FOLLOW UP STUDY  04/07/2017  . IR ANGIOGRAM FOLLOW UP STUDY  04/07/2017  . IR ANGIOGRAM SELECTIVE EACH ADDITIONAL VESSEL   04/07/2017  . IR TRANSCATH/EMBOLIZ  04/07/2017  . IR US GUIDE VASC ACCESS RIGHT  01/07/2018  . PARS PLANA VITRECTOMY Right 05/01/2017   Procedure: PARS PLANA VITRECTOMY WITH 25 GAUGE RIGHT EYE, endolaser photocoaglation;  Surgeon: Jalene Mullet, MD;  Location: [place];  Service: Ophthalmology;  Laterality: Right;  . RADIOLOGY WITH ANESTHESIA N/A 04/07/2017   Procedure: RADIOLOGY WITH ANESTHESIA;  Surgeon: Consuella Lose, MD;  Location: [place];  Service: Radiology;  Laterality: N/A;  . REPLACEMENT TOTAL KNEE BILATERAL  2004  . TEMPOROMANDIBULAR JOINT SURGERY     2 surgeries  . TUMOR REMOVAL    . VAGOTOMY  01/23/2012   Procedure: VAGOTOMY, antrectomy and BII;  Surgeon: Haywood Lasso, MD;  Location: [place];  Service: General;  Laterality: N/A;  Laparotomy with vagotomy.    Vitals:   03/12/18 1406  BP: (!) 134/93  Pulse: 88       03/12/18 1406  Symptoms/Limitations  Subjective No new complaints. No falls to report. Some LE pain.   Patient is accompained by: Family member  Limitations Standing;Walking  Pain Assessment  Currently in Pain? Yes  Pain Score 5  Pain Location Leg  Pain Orientation Right  Pain Descriptors / Indicators Nagging  Pain Type Neuropathic pain  Pain Onset More than a month ago  Pain Frequency Intermittent  Aggravating Factors  laying down, being still  Pain Relieving Factors standing up/moving, walking, treadmill       03/12/18 1417  Ambulation/Gait  Ambulation/Gait Yes  Ambulation/Gait Assistance 4: Min guard  Ambulation/Gait Assistance Details cues to slow down, for waker position and for step placement   Ambulation Distance (Feet)  (around gym for testing)  Assistive device Rolling walker  Gait Pattern Step-through pattern;Decreased step length - right;Decreased step length - left;Decreased stance time - left;Decreased stride length;Decreased hip/knee flexion - left;Ataxic;Decreased trunk rotation;Decreased weight shift to right  Ambulation Surface  Level;Indoor  Stairs Yes  Stairs Assistance 4: Min guard;4: Min assist  Stairs Assistance Details (indicate cue type and reason) min assist with 1st rep with cues to slow down for improved balance/stability; min guard assist with  2cd rep once pt slowed down  Stair Management Technique One rail Right;Alternating pattern;Forwards  Number of Stairs 8  Height of Stairs 6  Standardized Balance Assessment  Standardized Balance Assessment Berg Balance Test;10 meter walk test  10 Meter Walk 13.09= 2.51 ft/sec  Berg Balance Test  Sit to Stand 4  Standing Unsupported 4  Sitting with Back Unsupported but Feet Supported on Floor or Stool 4  Stand to Sit 4  Transfers 4  Standing Unsupported with Eyes Closed 4  Standing Ubsupported with Feet Together 3  From Standing, Reach Forward with Outstretched Arm 4  From Standing Position, Pick up Object from Floor 4  From Standing Position, Turn to Look Behind Over each Shoulder 4  Turn 360 Degrees 2  Standing Unsupported, Alternately Place Feet on Step/Stool 0  Standing Unsupported, One Foot in Front 3  Standing on One Leg 2  Total Score 46  Berg comment: 46/56= moderate risk of falls   Dynamic Gait Index  Level Surface 1  Change in Gait Speed 2  Gait with Horizontal Head Turns 2  Gait with Vertical Head Turns 2  Gait and Pivot Turn 2  Step Over Obstacle 1  Step Around Obstacles 2  Steps 2  Total Score 14  DGI comment: 14/24       PT Short Term Goals - 02/13/18 2045      PT SHORT TERM GOAL #1   Title  Pt will improve DGI to >/= 17/24 without AD    Baseline  13/24    Time  4    Period  Weeks    Status  Not Met      PT SHORT TERM GOAL #2   Title  Pt will decrease falls risk with community gait as indicated by increase in gait velocity with LRAD to > or = 2.6 ft/sec    Baseline  (3/5)1.67f/sec with RW and 2.04 without     Time  4    Period  Weeks    Status  Not Met      PT SHORT TERM GOAL #3   Title  Pt will decrease falls risk  as indicated by increase in BERG balance score to > or = 50/56    Baseline  3/5 Berg 43/56    Time  4    Period  Weeks    Status  Not Met      PT SHORT TERM GOAL #4   Title  Pt will ambulate >230' on indoor surfaces without AD with supervision    Time  4    Period  Weeks    Status  Not Met        PT Long Term Goals - 03/12/18 1409      PT LONG TERM GOAL #1   Title  Pt and husband will demonstrate independence with  HEP, including use of treadmill    Baseline  03/12/18: met with current program issued to date- corner balance, treadmill routine    Status  Achieved      PT LONG TERM GOAL #2   Title  Pt will demonstrate improved balance and decreased falls risk as indicated by BERG balance score of > or = 52/56    Baseline  03/12/18: 46/56 - no change from last assessment    Time  --    Period  --    Status  Not Met      PT LONG TERM GOAL #3   Title  Pt will ambulate without AD x 250' over outdoor paved surfaces with supervision; will negotiate 4 stairs with one rail with alternating sequence and supervision    Baseline  03/12/18: can negotiate steps with single rail reciprocally with supervision, continues to vary in assistance needed with gait without AD on level indoor surfaces- min to mod at times    Time  --    Period  --    Status  Partially Met      PT LONG TERM GOAL #4   Title  Pt will report improvement in Neuro QOL-LE to >/= 40%    Baseline  35.8%    Time  8    Period  Weeks    Status  On-going      PT LONG TERM GOAL #5   Title  Pt will decrease falls risk during gait in home/community as indicated by increase in gait velocity to > or = 3.0 ft/sec    Baseline  03/14/18: 2.51 ft/sec with RW, increased from 2.4 ft/sec at last assessment    Time  --    Period  --    Status  Partially Met      PT LONG TERM GOAL #6   Title  Pt will demonstrate decreased falls risk with gait as indicated by increase in DGI score to >/= 19/24 without AD    Baseline  03/14/18: 14/24 today,  decreased by 1 point from last assessment.     Time  --    Period  --    Status  Not Met         03/12/18 1408  Plan  Clinical Impression Statement Today's skilled session focused on progress toward LTGs for PT renewal. Pt has demo'd progress toward some goals, just not to meeting them. Will plan to assess FOTO at next visit to complete goal check and then send to PT for renewal.   Pt will benefit from skilled therapeutic intervention in order to improve on the following deficits Abnormal gait;Decreased balance;Decreased cognition;Decreased coordination;Decreased strength;Difficulty walking;Dizziness;Impaired sensation;Impaired vision/preception;Pain;Decreased activity tolerance;Decreased mobility  Rehab Potential Good  PT Frequency 2x / week  PT Duration 8 weeks  PT Treatment/Interventions ADLs/Self Care Home Management;Aquatic Therapy;DME Instruction;Gait training;Stair training;Functional mobility training;Therapeutic activities;Therapeutic exercise;Balance training;Neuromuscular re-education;Patient/family education;Vestibular;Visual/perceptual remediation/compensation;Cognitive remediation;Orthotic Fit/Training  PT Next Visit Plan Check BP. FOTO for final goal check.  continue with use of Bioness to left LE: gait on treamill both single and double LE's, gait with increased input through pelvis (tactile or weight?),; weight shifting on rockerboard.  Corner balance and finding midline  Consulted and Agree with Plan of Care Patient         Patient will benefit from skilled therapeutic intervention in order to improve the following deficits and impairments:  Abnormal gait, Decreased balance, Decreased cognition, Decreased coordination, Decreased strength, Difficulty walking, Dizziness, Impaired sensation, Impaired vision/preception, Pain, Decreased  activity tolerance, Decreased mobility  Visit Diagnosis: Ataxic gait  Unsteadiness on feet  Other abnormalities of gait and  mobility     Problem List Patient Active Problem List   Diagnosis Date Noted  . 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 Weakness with dizziness-  since [facility] and CVA 04/07/17 08/07/2017  . Disturbances of vision, late effect of stroke 08/06/2017  . Health education/counseling 08/05/2017  . High risk medications (not anticoagulants) long-term use 08/05/2017  . Neuritis-  R sided:  arm and leg/ body due to stroke 08/05/2017  . Elevated LDL cholesterol level 07/11/2017  . [employer] (NIH) Stroke Scale limb ataxia score 2, ataxia present in two limbs 06/25/2017  . 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 Elevated vitamin B12 level 05/30/2017  . Vitamin D deficiency 05/29/2017  . History of tobacco abuse-  30pk yr hx - quit 04/07/17 05/21/2017  . Gait disturbance, post-stroke 05/14/2017  . Benign essential HTN   . Vitreous hemorrhage of right eye ([place])   . Adjustment disorder with mixed anxiety and depressed mood   . 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 s/p SAH (subarachnoid hemorrhage) ([place]) 04/19/2017  . 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 Hypoxia   . Subarachnoid hemorrhage due to ruptured aneurysm ([place]) 04/07/2017  . CVA (cerebral vascular accident) ([place]) 04/07/2017  . Elevated gastrin level 01/25/2012  . Hypokalemia 01/21/2012  . Nausea & vomiting 01/20/2012  . Epigastric pain 01/20/2012  . Duodenal ulcer, acute with obstruction 10/17/2011  . S/P laparoscopic cholecystectomy 10/16/2011    Willow Ora, PTA, [facility] [address] [place], [place] [address] [phone] 03/14/18, 11:29 AM   Name: ALLEYNE LAC MRN: [id number] Date of Birth: May 23, 1976

## 2018-03-14 NOTE — Therapy (Signed)
[facility] [address] [place], [place], [address] Phone: [phone]   Fax:  [phone]  Occupational Therapy Treatment  Patient Details  Name: [name] Barnett MRN: [id number] Date of Birth: 29-Nov-1976 Referring Provider: Charlett Blake, MD   Encounter Date: 03/12/2018  OT End of Session - 03/14/18 1003    Visit Number  41    Number of Visits  50    Date for OT Re-Evaluation  04/05/18    Authorization Type  BC[employer], no visit limit/auth req.    Authorization Time Period  renewal completed 11/14/17 for 8 wks.  renewal completed 01/09/18 for 5 weeks/10 visits    Behavior During Therapy  [facility] for tasks assessed/performed       Past Medical History:  Diagnosis Date  . Duodenal obstruction   . GERD (gastroesophageal reflux disease)   . Hypertension   . Stroke [facility])    april 2018    Past Surgical History:  Procedure Laterality Date  . ABDOMINAL HYSTERECTOMY    . BALLOON DILATION  12/31/2011   Procedure: BALLOON DILATION;  Surgeon: Missy Sabins, MD;  Location: [facility] ENDOSCOPY;  Service: Endoscopy;  Laterality: N/A;  . CHOLECYSTECTOMY  07/28/11  . ESOPHAGOGASTRODUODENOSCOPY  10/17/2011   Procedure: ESOPHAGOGASTRODUODENOSCOPY (EGD);  Surgeon: Missy Sabins, MD;  Location: [facility] ENDOSCOPY;  Service: Endoscopy;  Laterality: N/A;  . IR ANGIO INTRA EXTRACRAN SEL INTERNAL CAROTID BILAT MOD SED  04/07/2017  . IR ANGIO INTRA EXTRACRAN SEL INTERNAL CAROTID BILAT MOD SED  01/07/2018  . IR ANGIO VERTEBRAL SEL VERTEBRAL UNI R MOD SED  04/07/2017  . IR ANGIO VERTEBRAL SEL VERTEBRAL UNI R MOD SED  01/07/2018  . IR ANGIOGRAM FOLLOW UP STUDY  04/07/2017  . IR ANGIOGRAM FOLLOW UP STUDY  04/07/2017  . IR ANGIOGRAM FOLLOW UP STUDY  04/07/2017  . IR ANGIOGRAM FOLLOW UP STUDY  04/07/2017  . IR ANGIOGRAM FOLLOW UP STUDY  04/07/2017  . IR ANGIOGRAM SELECTIVE EACH ADDITIONAL VESSEL  04/07/2017  . IR TRANSCATH/EMBOLIZ  04/07/2017  . IR US GUIDE VASC ACCESS  RIGHT  01/07/2018  . PARS PLANA VITRECTOMY Right 05/01/2017   Procedure: PARS PLANA VITRECTOMY WITH 25 GAUGE RIGHT EYE, endolaser photocoaglation;  Surgeon: Jalene Mullet, MD;  Location: [place];  Service: Ophthalmology;  Laterality: Right;  . RADIOLOGY WITH ANESTHESIA N/A 04/07/2017   Procedure: RADIOLOGY WITH ANESTHESIA;  Surgeon: Consuella Lose, MD;  Location: [place];  Service: Radiology;  Laterality: N/A;  . REPLACEMENT TOTAL KNEE BILATERAL  2004  . TEMPOROMANDIBULAR JOINT SURGERY     2 surgeries  . TUMOR REMOVAL    . VAGOTOMY  01/23/2012   Procedure: VAGOTOMY, antrectomy and BII;  Surgeon: Haywood Lasso, MD;  Location: [place];  Service: General;  Laterality: N/A;  Laparotomy with vagotomy.    Vitals:   03/12/18 1502  BP: 108/84    Subjective Assessment - 03/14/18 1003    Subjective   Pt agrees with plans for d/c    Pertinent History  Subarachnoid hemorrhage due to ruptured aneurysm, ataxia (L cerebellar); HTN, R side decr sensation, vertical diplopia/visual deficits, ataxia, Bilateral retinal hemorhage associated with SAH (with surgery on R eye in hospital and L approx 1 month ago)    Patient Stated Goals  be able to walk and use L arm    Currently in Pain?  Yes    Pain Score  5     Pain Location  Leg    Pain Orientation  Right    Pain Descriptors / Indicators  Aching    Pain Type  Neuropathic pain    Pain Onset  More than a month ago    Pain Frequency  Intermittent    Aggravating Factors   laying down, being still    Pain Relieving Factors  standing, stretching                treatment: Therapist checked progress towards goals. Pt demonstrates improved ability to reach into overhead cabinets with LUE and ability to perform transitional movements. Pt was unable to complete  9 hole peg test due to decreased coordination and frustration. Pt reports has  not been working with her pennies at home and now she intends to do so.           OT Education - 03/14/18  1004    Education provided  Yes    Education Details  Reviewed theraband HEP, and stretches in quadraped. Reinforced importance of fine motor tasks such as picking up pennies, flipping cards    Person(s) Educated  Patient;Parent(s)    Methods  Explanation;Demonstration;Verbal cues    Comprehension  Verbalized understanding;Returned demonstration;Verbal cues required          OT Long Term Goals - 03/12/18 1526      OT LONG TERM GOAL #8   Title  Pt will improve coordination for ADLs as shown by completing 9-hole peg test in less than 60sec.    Status  Not Met      OT LONG TERM GOAL  #9   Baseline  Pt will be independent with transitional movements during IADLs with no cueing for safety/impulsivity.    Status  Achieved      OT LONG TERM GOAL  #10   TITLE  Pt will be able to reach to retrieve lightweight object from overhead shelf consistently in 9/10 trials.    Status  Achieved      OT LONG TERM GOAL  #11   TITLE  Pt will be able to attend to functional task for at least 22mn without requiring redirection.    Status  Achieved      OT LONG TERM GOAL  #12   TITLE  I with updated HEP    Status  Achieved            Plan - 03/14/18 1001    Clinical Impression Statement  Pt demonstrates good overall progress. She is ready to transition to exercising at home.    Occupational Profile and client history currently impacting functional performance  Pt is a 42y.o. female s/p subarachnoid hemorrhage due to ruptured aneurysm, L cerebellar infarct with ataxia.  Pt was working full time and indpendent prior to hospitalization.  Pt lives with husband and cared for 123y.o. daughter.  Medical history includes HTN, GERD, and Bilateral retinal hemorhage associated with SAH (needed surgery).      Rehab Potential  Good    Current Impairments/barriers affecting progress:  cognitive deficits, impulsivity    OT Frequency  -- 6 visits    OT Duration  6 weeks    OT Treatment/Interventions   Self-care/ADL training;Therapeutic exercise;DME and/or AE instruction;Therapeutic activities;Cognitive remediation/compensation;Visual/perceptual remediation/compensation;Passive range of motion;Functional Mobility Training;Neuromuscular education;Cryotherapy;Paraffin;Energy conservation;Manual Therapy;Patient/family education;Ultrasound;Balance training;Moist Heat;Fluidtherapy    Plan  d/c OT    Consulted and Agree with Plan of Care  Family member/caregiver;Patient    Family Member Consulted  mother       Patient will benefit from skilled therapeutic intervention  in order to improve the following deficits and impairments:  Decreased coordination, Decreased range of motion, Difficulty walking, Abnormal gait, Decreased safety awareness, Impaired sensation, Decreased knowledge of precautions, Decreased balance, Decreased knowledge of use of DME, Impaired UE functional use, Decreased cognition, Decreased mobility, Decreased strength, Impaired vision/preception, Impaired perceived functional ability  Visit Diagnosis: Other lack of coordination  Other disturbances of skin sensation  Attention and concentration deficit  Visuospatial deficit  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 She met updated goals with exception of 9 hole peg test.   Remaining deficits: Decreased strength, decreased coordination, decreased functional mobility, cognitive deficits   Education / Equipment: Pt / mother were educated regarding updated HEP.Therapist recommends supervision with any cooking activities. Pt verbalizes understanding of all education. Pt is being d/c so that she can focus on HEP and self care activities at home. She may benefit from additional therapy in 3-6  mons. Plan: Patient agrees to discharge.  Patient goals were partially met.                                                                                                      ?????      Problem List Patient Active Problem List   Diagnosis Date Noted  . 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 Weakness with dizziness-  since [facility] and CVA 04/07/17 08/07/2017  . Disturbances of vision, late effect of stroke 08/06/2017  . Health education/counseling 08/05/2017  . High risk medications (not anticoagulants) long-term use 08/05/2017  . Neuritis-  R sided:  arm and leg/ body due to stroke 08/05/2017  . Elevated LDL cholesterol level 07/11/2017  . [employer] (NIH) Stroke Scale limb ataxia score 2, ataxia present in two limbs 06/25/2017  . 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 Elevated vitamin B12 level 05/30/2017  . Vitamin D deficiency 05/29/2017  . History of tobacco abuse-  30pk yr hx - quit 04/07/17 05/21/2017  . Gait disturbance, post-stroke 05/14/2017  . Benign essential HTN   . Vitreous hemorrhage of right eye ([place])   . Adjustment disorder with mixed anxiety and depressed mood   . 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 s/p SAH (subarachnoid hemorrhage) ([place]) 04/19/2017  . 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 Hypoxia   . Subarachnoid hemorrhage due to ruptured aneurysm ([place]) 04/07/2017  . CVA (cerebral vascular accident) ([place]) 04/07/2017  . Elevated gastrin level 01/25/2012  . Hypokalemia 01/21/2012  . Nausea & vomiting 01/20/2012  . Epigastric pain 01/20/2012  . Duodenal ulcer, acute with obstruction 10/17/2011  . S/P laparoscopic cholecystectomy 10/16/2011    Vaanya Shambaugh 03/14/2018, 10:06 AM Theone Murdoch, OTR/L [phone] Phone: [phone] 10:08 AM 03/14/18 [facility] [address] [place],  [place], [address] Phone: [phone]  Fax:  [phone]  Name: [name] Barnett MRN: [id number] Date of Birth: [birth year]

## 2018-03-17 ENCOUNTER — Ambulatory Visit: Payer: BLUE CROSS/BLUE SHIELD | Admitting: Physical Therapy

## 2018-03-17 ENCOUNTER — Encounter: Payer: Self-pay | Admitting: Physical Therapy

## 2018-03-17 DIAGNOSIS — I69054 Hemiplegia and hemiparesis following nontraumatic subarachnoid hemorrhage affecting left non-dominant side: Secondary | ICD-10-CM

## 2018-03-17 DIAGNOSIS — R270 Ataxia, unspecified: Secondary | ICD-10-CM

## 2018-03-17 DIAGNOSIS — R42 Dizziness and giddiness: Secondary | ICD-10-CM

## 2018-03-17 DIAGNOSIS — R2689 Other abnormalities of gait and mobility: Secondary | ICD-10-CM

## 2018-03-17 DIAGNOSIS — R2681 Unsteadiness on feet: Secondary | ICD-10-CM

## 2018-03-17 DIAGNOSIS — R260 Ataxic gait: Secondary | ICD-10-CM | POA: Diagnosis not present

## 2018-03-17 DIAGNOSIS — R414 Neurologic neglect syndrome: Secondary | ICD-10-CM

## 2018-03-17 DIAGNOSIS — R278 Other lack of coordination: Secondary | ICD-10-CM

## 2018-03-17 NOTE — Therapy (Signed)
[facility] [address] [place], [place], [address] Phone: [phone]   Fax:  [phone]  Physical Therapy Treatment  Patient Details  Name: [name] Barnett MRN: [id number] Date of Birth: March 28, 1976 Referring Provider: Charlett Blake, MD   Encounter Date: 03/17/2018  PT End of Session - 03/17/18 1440    Visit Number  40    Number of Visits  50    Date for PT Re-Evaluation  05/09/18    Authorization Type  BCBS    PT Start Time  1100    PT Stop Time  1146    PT Time Calculation (min)  46 min    Equipment Utilized During Treatment  Gait belt    Activity Tolerance  Patient tolerated treatment well    Behavior During Therapy  WFL for tasks assessed/performed       Past Medical History:  Diagnosis Date  . Duodenal obstruction   . GERD (gastroesophageal reflux disease)   . Hypertension   . Stroke [facility])    april 2018    Past Surgical History:  Procedure Laterality Date  . ABDOMINAL HYSTERECTOMY    . BALLOON DILATION  12/31/2011   Procedure: BALLOON DILATION;  Surgeon: Missy Sabins, MD;  Location[facility] ENDOSCOPY;  Service: Endoscopy;  Laterality: N/A;  . CHOLECYSTECTOMY  07/28/11  . ESOPHAGOGASTRODUODENOSCOPY  10/17/2011   Procedure: ESOPHAGOGASTRODUODENOSCOPY (EGD);  Surgeon: Missy Sabins, MD;  Location: [facility] ENDOSCOPY;  Service: Endoscopy;  Laterality: N/A;  . IR ANGIO INTRA EXTRACRAN SEL INTERNAL CAROTID BILAT MOD SED  04/07/2017  . IR ANGIO INTRA EXTRACRAN SEL INTERNAL CAROTID BILAT MOD SED  01/07/2018  . IR ANGIO VERTEBRAL SEL VERTEBRAL UNI R MOD SED  04/07/2017  . IR ANGIO VERTEBRAL SEL VERTEBRAL UNI R MOD SED  01/07/2018  . IR ANGIOGRAM FOLLOW UP STUDY  04/07/2017  . IR ANGIOGRAM FOLLOW UP STUDY  04/07/2017  . IR ANGIOGRAM FOLLOW UP STUDY  04/07/2017  . IR ANGIOGRAM FOLLOW UP STUDY  04/07/2017  . IR ANGIOGRAM FOLLOW UP STUDY  04/07/2017  . IR ANGIOGRAM SELECTIVE EACH ADDITIONAL VESSEL  04/07/2017  . IR  TRANSCATH/EMBOLIZ  04/07/2017  . IR US GUIDE VASC ACCESS RIGHT  01/07/2018  . PARS PLANA VITRECTOMY Right 05/01/2017   Procedure: PARS PLANA VITRECTOMY WITH 25 GAUGE RIGHT EYE, endolaser photocoaglation;  Surgeon: Jalene Mullet, MD;  Location: [place];  Service: Ophthalmology;  Laterality: Right;  . RADIOLOGY WITH ANESTHESIA N/A 04/07/2017   Procedure: RADIOLOGY WITH ANESTHESIA;  Surgeon: Consuella Lose, MD;  Location: [place];  Service: Radiology;  Laterality: N/A;  . REPLACEMENT TOTAL KNEE BILATERAL  2004  . TEMPOROMANDIBULAR JOINT SURGERY     2 surgeries  . TUMOR REMOVAL    . VAGOTOMY  01/23/2012   Procedure: VAGOTOMY, antrectomy and BII;  Surgeon: Haywood Lasso, MD;  Location: [place];  Service: General;  Laterality: N/A;  Laparotomy with vagotomy.    There were no vitals filed for this visit.  Subjective Assessment - 03/17/18 1106    Subjective  No falls. PT has been helping and she feels with additional PT that she can improve her walking & balance more    Patient is accompained by:  Family member    Limitations  Standing;Walking    Patient Stated Goals  To walk safely without walker, improve balance so no more falls.    Currently in Pain?  Yes    Pain Score  8     Pain  Location  Hip    Pain Orientation  Right    Pain Descriptors / Indicators  Stabbing;Aching;Dull;Other (Comment) pulling on muscles    Pain Type  Neuropathic pain    Pain Radiating Towards  thigh & calf    Pain Onset  More than a month ago    Pain Frequency  Intermittent    Aggravating Factors   laying down, being still    Pain Relieving Factors  standing, stretching         [facility] PT Assessment - 03/17/18 1423      Observation/Other Assessments   Focus on Therapeutic Outcomes (FOTO)   40.27 Functional Status  Initial FS 07/01/17 40.27,  2nd FS 01/14/18 51.58    Neuro Quality of Life   34.3 of 15.70 - 62.28 scale Initial 07/01/17 31.3 & 2nd Neuro QoL 01/14/18 35.8                   [facility] Adult PT  Treatment/Exercise - 03/17/18 1100      Transfers   Transfers  Sit to Stand;Stand to Sit    Sit to Stand  4: Min guard;With upper extremity assist;From chair/3-in-1    Stand to Sit  4: Min guard    Stand to Sit Details (indicate cue type and reason)  Tactile cues for weight shifting;Verbal cues for technique    Stand to Sit Details  cues to keep weight left so left ischuim is directly behind left heel      Ambulation/Gait   Ambulation/Gait  Yes    Ambulation/Gait Assistance  4: Min assist;4: Min guard    Ambulation/Gait Assistance Details  verbal cues on narrow base within 6" and upright posture.  Bioness E-stim for Lower leg & hamstring.     Ambulation Distance (Feet)  300 Feet 300' X 2    Assistive device  1 person hand held assist;Other (Comment) Bioness lower leg & thigh unit    Ambulation Surface  Indoor;Level      Exercises   Exercises  Ankle      Knee/Hip Exercises: Standing   Knee Flexion  AROM;10 reps;1 set knee flexion without hip flexion with Bioness    Other Standing Knee Exercises  stepping over 4" obstacle with Bioness hamstring    Other Standing Knee Exercises  squats with Bioness stim      Electrical Stimulation   Electrical Stimulation Location  left anterior tibialis, left hamstring     Electrical Stimulation Action  Neuromuscular Re-ed for ankle DF during swing /gait and hamstring for improved clearance.     Electrical Stimulation Parameters  refer to tablet 1 for details.    Electrical Stimulation Goals  Strength;Neuromuscular facilitation      Ankle Exercises: Standing   Toe Raise  10 reps;5 seconds Bioness stim with left foot step forward position    Other Standing Ankle Exercises  ankle DF Bioness stim stepping over 4" obstacle               PT Short Term Goals - 03/17/18 1516      PT SHORT TERM GOAL #1   Title  Pt will improve DGI to >/= 17/24 without AD    Time  4    Period  Weeks    Status  On-going    Target Date  04/11/18      PT SHORT  TERM GOAL #2   Title  Pt will decrease falls risk with community gait as indicated by increase in gait velocity  without device except Bioness to > or = 2.6 ft/sec    Time  4    Period  Weeks    Status  Revised    Target Date  04/11/18      PT SHORT TERM GOAL #3   Title  Pt will decrease falls risk as indicated by increase in BERG balance score to > or = 49/56    Time  4    Period  Weeks    Status  Revised    Target Date  04/11/18      PT SHORT TERM GOAL #4   Title  Pt will ambulate >300' on indoor surfaces without AD with Bioness with supervision    Time  4    Period  Weeks    Status  Revised    Target Date  04/11/18        PT Long Term Goals - 03/17/18 1115      PT LONG TERM GOAL #1   Title  Pt and husband will demonstrate independence with HEP, including use of treadmill    Baseline  03/12/18: met with current program issued to date- corner balance, treadmill routine    Time  8    Period  Weeks    Status  On-going    Target Date  05/09/18      PT LONG TERM GOAL #2   Title  Pt will demonstrate improved balance and decreased falls risk as indicated by BERG balance score of > or = 52/56    Baseline  03/12/18: 46/56 - no change from last assessment    Time  8    Period  Weeks    Status  On-going    Target Date  05/09/18      PT LONG TERM GOAL #3   Title  Pt will ambulate without AD x 350' over outdoor paved surfaces with supervision; will negotiate 4 stairs with one rail with alternating sequence and supervision    Baseline  03/12/18: can negotiate steps with single rail reciprocally with supervision, continues to vary in assistance needed with gait without AD on level indoor surfaces- min to mod at times    Time  8    Period  Weeks    Status  Revised    Target Date  05/09/18      PT LONG TERM GOAL #4   Title  Pt will report improvement in Neuro QOL-LE to >/= 40%    Baseline  03/17/2018 Neuro QOL 34.3    Time  8    Period  Weeks    Status  On-going    Target Date   05/09/18      PT LONG TERM GOAL #5   Title  Pt will decrease falls risk during gait in home/community as indicated by increase in gait velocity to > or = 3.0 ft/sec    Baseline  03/14/18: 2.51 ft/sec with RW, increased from 2.4 ft/sec at last assessment    Time  8    Status  On-going    Target Date  05/09/18      PT LONG TERM GOAL #6   Title  Pt will demonstrate decreased falls risk with gait as indicated by increase in DGI score to >/= 19/24 without AD    Baseline  03/14/18: 14/24 today, decreased by 1 point from last assessment.     Time  8    Period  Weeks    Status  On-going    Target Date  05/09/18            Plan - 03/17/18 1520    Clinical Impression Statement  Patient is making slow progress towards goals but would need additional skilled care to further reduce fall risk & improve mobility. Patient responds well to [employer] for muscle re-education. Patient did not fully meet any LTGs but improved function for each.     Rehab Potential  Good    PT Frequency  2x / week    PT Duration  8 weeks    PT Treatment/Interventions  ADLs/Self Care Home Management;Aquatic Therapy;DME Instruction;Gait training;Stair training;Functional mobility training;Therapeutic activities;Therapeutic exercise;Balance training;Neuromuscular re-education;Patient/family education;Vestibular;Visual/perceptual remediation/compensation;Cognitive remediation;Orthotic Fit/Training;Electrical Stimulation    PT Next Visit Plan  Check BP. continue with use of Bioness to left LE: gait on treamill both single and double LE's, gait with increased input through pelvis (tactile or weight?),; weight shifting on rockerboard.  Corner balance and finding midline    Consulted and Agree with Plan of Care  Patient;Family member/caregiver    Family Member Consulted  husband       Patient will benefit from skilled therapeutic intervention in order to improve the following deficits and impairments:  Abnormal  gait, Decreased balance, Decreased cognition, Decreased coordination, Decreased strength, Difficulty walking, Dizziness, Impaired sensation, Impaired vision/preception, Pain, Decreased activity tolerance, Decreased mobility, Decreased endurance, Postural dysfunction  Visit Diagnosis: Other lack of coordination  Unsteadiness on feet  Ataxic gait  Other abnormalities of gait and mobility  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 Weakness with dizziness-  since [facility] and CVA 04/07/17 08/07/2017  . Disturbances of vision, late effect of stroke 08/06/2017  . Health education/counseling 08/05/2017  . High risk medications (not anticoagulants) long-term use 08/05/2017  . Neuritis-  R sided:  arm and leg/ body due to stroke 08/05/2017  . Elevated LDL cholesterol level 07/11/2017  . [employer] (NIH) Stroke Scale limb ataxia score 2, ataxia present in two limbs 06/25/2017  . 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 Elevated vitamin B12 level 05/30/2017  . Vitamin D deficiency 05/29/2017  . History of tobacco abuse-  30pk yr hx - quit 04/07/17 05/21/2017  . Gait disturbance, post-stroke 05/14/2017  . Benign essential HTN   . Vitreous hemorrhage of right eye ([place])   . Adjustment disorder with mixed anxiety and depressed mood   . 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 s/p SAH (subarachnoid hemorrhage) ([place]) 04/19/2017  . 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 Hypoxia   . Subarachnoid hemorrhage due to ruptured aneurysm ([place]) 04/07/2017  . CVA (cerebral vascular accident) ([place]) 04/07/2017  . Elevated gastrin level 01/25/2012  . Hypokalemia  01/21/2012  . Nausea & vomiting 01/20/2012  . Epigastric pain 01/20/2012  . Duodenal ulcer, acute with obstruction 10/17/2011  . S/P laparoscopic cholecystectomy 10/16/2011    Jamey Reas PT, DPT 03/17/2018, 3:30 PM  [facility] [address] [place], [place], [address] Phone: [phone]   Fax:  [phone]  Name: [name] Barnett MRN: [id number] Date of Birth: 1976/04/12

## 2018-03-21 ENCOUNTER — Ambulatory Visit: Payer: BLUE CROSS/BLUE SHIELD | Admitting: Rehabilitation

## 2018-03-21 ENCOUNTER — Encounter: Payer: Self-pay | Admitting: Rehabilitation

## 2018-03-21 DIAGNOSIS — R260 Ataxic gait: Secondary | ICD-10-CM | POA: Diagnosis not present

## 2018-03-21 DIAGNOSIS — R2681 Unsteadiness on feet: Secondary | ICD-10-CM

## 2018-03-21 DIAGNOSIS — I69054 Hemiplegia and hemiparesis following nontraumatic subarachnoid hemorrhage affecting left non-dominant side: Secondary | ICD-10-CM

## 2018-03-21 DIAGNOSIS — R2689 Other abnormalities of gait and mobility: Secondary | ICD-10-CM

## 2018-03-21 DIAGNOSIS — R278 Other lack of coordination: Secondary | ICD-10-CM

## 2018-03-21 NOTE — Therapy (Signed)
[facility] [address] [place], [place], [address] Phone: [phone]   Fax:  [phone]  Physical Therapy Treatment  Patient Details  Name: [name] Barnett MRN: [id number] Date of Birth: 01/24/1976 Referring Provider: Charlett Blake, MD   Encounter Date: 03/21/2018  PT End of Session - 03/21/18 2015    Visit Number  71    Number of Visits  71    Date for PT Re-Evaluation  05/09/18    Authorization Type  [employer]    PT Start Time  1622    PT Stop Time  1702    PT Time Calculation (min)  40 min    Equipment Utilized During Treatment  Gait belt    Activity Tolerance  Patient tolerated treatment well    Behavior During Therapy  WFL for tasks assessed/performed       Past Medical History:  Diagnosis Date  . Duodenal obstruction   . GERD (gastroesophageal reflux disease)   . Hypertension   . Stroke [facility])    april 2018    Past Surgical History:  Procedure Laterality Date  . ABDOMINAL HYSTERECTOMY    . BALLOON DILATION  12/31/2011   Procedure: BALLOON DILATION;  Surgeon: Missy Sabins, MD;  Location: [facility] ENDOSCOPY;  Service: Endoscopy;  Laterality: N/A;  . CHOLECYSTECTOMY  07/28/11  . ESOPHAGOGASTRODUODENOSCOPY  10/17/2011   Procedure: ESOPHAGOGASTRODUODENOSCOPY (EGD);  Surgeon: Missy Sabins, MD;  Location: [facility] ENDOSCOPY;  Service: Endoscopy;  Laterality: N/A;  . IR ANGIO INTRA EXTRACRAN SEL INTERNAL CAROTID BILAT MOD SED  04/07/2017  . IR ANGIO INTRA EXTRACRAN SEL INTERNAL CAROTID BILAT MOD SED  01/07/2018  . IR ANGIO VERTEBRAL SEL VERTEBRAL UNI R MOD SED  04/07/2017  . IR ANGIO VERTEBRAL SEL VERTEBRAL UNI R MOD SED  01/07/2018  . IR ANGIOGRAM FOLLOW UP STUDY  04/07/2017  . IR ANGIOGRAM FOLLOW UP STUDY  04/07/2017  . IR ANGIOGRAM FOLLOW UP STUDY  04/07/2017  . IR ANGIOGRAM FOLLOW UP STUDY  04/07/2017  . IR ANGIOGRAM FOLLOW UP STUDY  04/07/2017  . IR ANGIOGRAM SELECTIVE EACH ADDITIONAL VESSEL  04/07/2017  . IR  TRANSCATH/EMBOLIZ  04/07/2017  . IR US GUIDE VASC ACCESS RIGHT  01/07/2018  . PARS PLANA VITRECTOMY Right 05/01/2017   Procedure: PARS PLANA VITRECTOMY WITH 25 GAUGE RIGHT EYE, endolaser photocoaglation;  Surgeon: Jalene Mullet, MD;  Location: [facility];  Service: Ophthalmology;  Laterality: Right;  . RADIOLOGY WITH ANESTHESIA N/A 04/07/2017   Procedure: RADIOLOGY WITH ANESTHESIA;  Surgeon: Consuella Lose, MD;  Location: [facility];  Service: Radiology;  Laterality: N/A;  . REPLACEMENT TOTAL KNEE BILATERAL  2004  . TEMPOROMANDIBULAR JOINT SURGERY     2 surgeries  . TUMOR REMOVAL    . VAGOTOMY  01/23/2012   Procedure: VAGOTOMY, antrectomy and BII;  Surgeon: Haywood Lasso, MD;  Location: [facility];  Service: General;  Laterality: N/A;  Laparotomy with vagotomy.    There were no vitals filed for this visit.  Subjective Assessment - 03/21/18 1955    Subjective  Pt reports increased pain in R groin that is worse with continued walking and any kind of hip flex stretch.     Limitations  Standing;Walking    Patient Stated Goals  To walk safely without walker, improve balance so no more falls.    Currently in Pain?  Yes    Pain Score  8     Pain Location  Hip    Pain Orientation  Right  Pain Descriptors / Indicators  Stabbing    Pain Onset  More than a month ago    Pain Frequency  Intermittent    Aggravating Factors   walking, stretching     Pain Relieving Factors  rest                       [facility] Adult PT Treatment/Exercise - 03/21/18 1630      Ambulation/Gait   Ambulation/Gait  Yes    Ambulation/Gait Assistance  4: Min assist    Ambulation/Gait Assistance Details  Utilized L lower leg and L thigh (hamstring) Bioness unit for gait during session.  Initially had pt ambulate x 115' with RW without BIoness to better assess deficits without estim.  Note L knee tends to remain in extension to hyperextension position with circumduction to clear LLE during swing phase of gait.  Once  Bioness was donned, note marked improvement in L knee flex during swing phase of gait and mild improvement in heel strike/DF assist.  PT was able to increase intensity of Bioness with gait/WB to improve results.  PT had pt ambulate with RW with Bioness and also without RW (facing PT with BUE support on PTs arms) to prove assist for upright posture, decreased gait speed and cues for improved forward movement and decreased lateral movement.  Pt able to do within session and did see slight improvement with gait leaving clinic.      Ambulation Distance (Feet)  115 Feet x 3 reps    Assistive device  Rolling walker;1 person hand held assist    Gait Pattern  Step-through pattern;Decreased step length - right;Decreased step length - left;Decreased stance time - left;Decreased stride length;Decreased hip/knee flexion - left;Ataxic;Decreased trunk rotation;Decreased weight shift to right    Ambulation Surface  Level;Indoor      Neuro Re-ed    Neuro Re-ed Details   With use of Bioness on L lower leg and L hamstrings, performed LLE forward stepping over small orange barrier (and retrostepping) x 10 reps with RUE support and intermittent LUE support with cues for increased L forward weight shift/translation over LLE once pt had stepped.  Pt had marked difficulty with task initially, however with facilitation from PT did better.  Note pt tends to keep knee in hyperextension and does not translate L tibia over ankle.  therefore had pt perform L SLS with support on counter with forward trunk lean/weight shift and elevating R LE from ground (modified warrior 3 pose) to increase forward weight shift.  Pt tolerated well, therefore had her perform task with L mini squat with cues on improved quad control.  Forward/backward weigt shift with feet staggered with forward weight shift ending in forward lunge and back to upright position.  Note last two tasks done without Bioness.       Modalities   Modalities  Engineer, manufacturing systems Stimulation Location  left anterior tibialis, left hamstring     Electrical Stimulation Action  For improved L DF assist with swing phase, improved L knee flex during swing and also to prevent L knee hyperextension when performing standing NMR exercises.      Electrical Stimulation Parameters  Refer to tablet 1 for details on parameters    Electrical Stimulation Goals  Neuromuscular facilitation               PT Short Term Goals - 03/17/18 1516      PT  SHORT TERM GOAL #1   Title  Pt will improve DGI to >/= 17/24 without AD    Time  4    Period  Weeks    Status  On-going    Target Date  04/11/18      PT SHORT TERM GOAL #2   Title  Pt will decrease falls risk with community gait as indicated by increase in gait velocity without device except Bioness to > or = 2.6 ft/sec    Time  4    Period  Weeks    Status  Revised    Target Date  04/11/18      PT SHORT TERM GOAL #3   Title  Pt will decrease falls risk as indicated by increase in BERG balance score to > or = 49/56    Time  4    Period  Weeks    Status  Revised    Target Date  04/11/18      PT SHORT TERM GOAL #4   Title  Pt will ambulate >300' on indoor surfaces without AD with Bioness with supervision    Time  4    Period  Weeks    Status  Revised    Target Date  04/11/18        PT Long Term Goals - 03/17/18 1115      PT LONG TERM GOAL #1   Title  Pt and husband will demonstrate independence with HEP, including use of treadmill    Baseline  03/12/18: met with current program issued to date- corner balance, treadmill routine    Time  8    Period  Weeks    Status  On-going    Target Date  05/09/18      PT LONG TERM GOAL #2   Title  Pt will demonstrate improved balance and decreased falls risk as indicated by BERG balance score of > or = 52/56    Baseline  03/12/18: 46/56 - no change from last assessment    Time  8    Period  Weeks    Status  On-going     Target Date  05/09/18      PT LONG TERM GOAL #3   Title  Pt will ambulate without AD x 350' over outdoor paved surfaces with supervision; will negotiate 4 stairs with one rail with alternating sequence and supervision    Baseline  03/12/18: can negotiate steps with single rail reciprocally with supervision, continues to vary in assistance needed with gait without AD on level indoor surfaces- min to mod at times    Time  8    Period  Weeks    Status  Revised    Target Date  05/09/18      PT LONG TERM GOAL #4   Title  Pt will report improvement in Neuro QOL-LE to >/= 40%    Baseline  03/17/2018 Neuro QOL 34.3    Time  8    Period  Weeks    Status  On-going    Target Date  05/09/18      PT LONG TERM GOAL #5   Title  Pt will decrease falls risk during gait in home/community as indicated by increase in gait velocity to > or = 3.0 ft/sec    Baseline  03/14/18: 2.51 ft/sec with RW, increased from 2.4 ft/sec at last assessment    Time  8    Status  On-going    Target Date  05/09/18  PT LONG TERM GOAL #6   Title  Pt will demonstrate decreased falls risk with gait as indicated by increase in DGI score to >/= 19/24 without AD    Baseline  03/14/18: 14/24 today, decreased by 1 point from last assessment.     Time  8    Period  Weeks    Status  On-going    Target Date  05/09/18            Plan - 03/21/18 2015    Clinical Impression Statement  Continued to utilized Bioness for LLE (lower leg and hamstrings) for improved gait quality with DF assist and improved L knee flex during swing.  Note improvement with use of Bioness, however pt limited today by increased R hip pain.  Recommend she speak with MD about getting images of hip as it sounds like hip joint pain due to referral pattern.     Rehab Potential  Good    PT Frequency  2x / week    PT Duration  8 weeks    PT Treatment/Interventions  ADLs/Self Care Home Management;Aquatic Therapy;DME Instruction;Gait training;Stair  training;Functional mobility training;Therapeutic activities;Therapeutic exercise;Balance training;Neuromuscular re-education;Patient/family education;Vestibular;Visual/perceptual remediation/compensation;Cognitive remediation;Orthotic Fit/Training;Electrical Stimulation    PT Next Visit Plan  Check BP. continue with use of Bioness to left LE: gait on treamill both single and double LE's, gait with increased input through pelvis (tactile or weight?),; weight shifting on rockerboard.  Corner balance and finding midline    Consulted and Agree with Plan of Care  Patient;Family member/caregiver    Family Member Consulted  husband       Patient will benefit from skilled therapeutic intervention in order to improve the following deficits and impairments:  Abnormal gait, Decreased balance, Decreased cognition, Decreased coordination, Decreased strength, Difficulty walking, Dizziness, Impaired sensation, Impaired vision/preception, Pain, Decreased activity tolerance, Decreased mobility, Decreased endurance, Postural dysfunction  Visit Diagnosis: Other lack of coordination  Unsteadiness on feet  Ataxic gait  Other abnormalities of gait and mobility  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 Weakness with dizziness-  since [facility] and CVA 04/07/17 08/07/2017  . Disturbances of vision, late effect of stroke 08/06/2017  . Health education/counseling 08/05/2017  . High risk medications (not anticoagulants) long-term use 08/05/2017  . Neuritis-  R sided:  arm and leg/ body due to stroke 08/05/2017  . Elevated LDL cholesterol level 07/11/2017  . [employer] (NIH) Stroke Scale limb ataxia score 2, ataxia present in two limbs 06/25/2017  . 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 Elevated  vitamin B12 level 05/30/2017  . Vitamin D deficiency 05/29/2017  . History of tobacco abuse-  30pk yr hx - quit 04/07/17 05/21/2017  . Gait disturbance, post-stroke 05/14/2017  . Benign essential HTN   . Vitreous hemorrhage of right eye ([place])   . Adjustment disorder with mixed anxiety and depressed mood   . 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 s/p SAH (subarachnoid hemorrhage) ([place]) 04/19/2017  . 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 Hypoxia   . Subarachnoid hemorrhage due to ruptured aneurysm ([place]) 04/07/2017  . CVA (cerebral vascular accident) ([place]) 04/07/2017  . Elevated gastrin level 01/25/2012  . Hypokalemia 01/21/2012  . Nausea & vomiting 01/20/2012  . Epigastric pain  01/20/2012  . Duodenal ulcer, acute with obstruction 10/17/2011  . S/P laparoscopic cholecystectomy 10/16/2011    Cameron Sprang, PT, MPT [facility] [address] [place], [place], [address] Phone: [phone]   Fax:  [phone] 03/21/18, 8:22 PM  Name: KYONA CHAUNCEY MRN: [id number] Date of Birth: [birth year]

## 2018-03-24 ENCOUNTER — Ambulatory Visit: Payer: BLUE CROSS/BLUE SHIELD | Admitting: Physical Therapy

## 2018-03-24 ENCOUNTER — Encounter: Payer: Self-pay | Admitting: Physical Therapy

## 2018-03-24 ENCOUNTER — Other Ambulatory Visit: Payer: Self-pay | Admitting: Physical Medicine & Rehabilitation

## 2018-03-24 VITALS — BP 125/90 | HR 81

## 2018-03-24 DIAGNOSIS — R260 Ataxic gait: Secondary | ICD-10-CM | POA: Diagnosis not present

## 2018-03-24 DIAGNOSIS — R278 Other lack of coordination: Secondary | ICD-10-CM

## 2018-03-24 DIAGNOSIS — I69054 Hemiplegia and hemiparesis following nontraumatic subarachnoid hemorrhage affecting left non-dominant side: Secondary | ICD-10-CM

## 2018-03-24 DIAGNOSIS — R2689 Other abnormalities of gait and mobility: Secondary | ICD-10-CM

## 2018-03-24 DIAGNOSIS — R2681 Unsteadiness on feet: Secondary | ICD-10-CM

## 2018-03-24 DIAGNOSIS — R42 Dizziness and giddiness: Secondary | ICD-10-CM

## 2018-03-25 ENCOUNTER — Ambulatory Visit: Payer: BLUE CROSS/BLUE SHIELD | Admitting: Physical Medicine & Rehabilitation

## 2018-03-25 ENCOUNTER — Encounter: Payer: BLUE CROSS/BLUE SHIELD | Attending: Physical Medicine & Rehabilitation

## 2018-03-25 ENCOUNTER — Ambulatory Visit
Admission: RE | Admit: 2018-03-25 | Discharge: 2018-03-25 | Disposition: A | Discharge status: Home / Self Care | Payer: BLUE CROSS/BLUE SHIELD | Source: Ambulatory Visit | Attending: Physical Medicine & Rehabilitation | Admitting: Physical Medicine & Rehabilitation

## 2018-03-25 ENCOUNTER — Encounter: Payer: Self-pay | Admitting: Physical Medicine & Rehabilitation

## 2018-03-25 VITALS — BP 131/85 | HR 71 | Resp 14 | Ht 64.0 in | Wt 178.0 lb

## 2018-03-25 DIAGNOSIS — I725 Aneurysm of other precerebral arteries: Secondary | ICD-10-CM | POA: Diagnosis present

## 2018-03-25 DIAGNOSIS — G8929 Other chronic pain: Secondary | ICD-10-CM | POA: Diagnosis not present

## 2018-03-25 DIAGNOSIS — M25551 Pain in right hip: Secondary | ICD-10-CM

## 2018-03-25 DIAGNOSIS — R42 Dizziness and giddiness: Secondary | ICD-10-CM | POA: Diagnosis not present

## 2018-03-25 DIAGNOSIS — I69093 Ataxia following nontraumatic subarachnoid hemorrhage: Secondary | ICD-10-CM | POA: Insufficient documentation

## 2018-03-25 NOTE — Telephone Encounter (Signed)
Recieved electronic medication refill request for Topamax.  Previous notes from current to 1 year ago only mention gabapentin vs lyrica.  Unsure if ok to refill this medication.  Please advise.

## 2018-03-25 NOTE — Therapy (Signed)
[facility] [address] [place], [place], [address] Phone: [phone]   Fax:  [phone]  Physical Therapy Treatment  Patient Details  Name: [name] Barnett MRN: [id number] Date of Birth: [birth year] Referring Provider: Charlett Blake, MD   Encounter Date: 03/24/2018  PT End of Session - 03/24/18 1536    Visit Number  47    Number of Visits  23    Date for PT Re-Evaluation  05/09/18    Authorization Type  [employer]    PT Start Time  1533    PT Stop Time  1617    PT Time Calculation (min)  44 min    Equipment Utilized During Treatment  Gait belt    Activity Tolerance  Patient tolerated treatment well    Behavior During Therapy  WFL for tasks assessed/performed       Past Medical History:  Diagnosis Date  . Duodenal obstruction   . GERD (gastroesophageal reflux disease)   . Hypertension   . Stroke [facility])    april 2018    Past Surgical History:  Procedure Laterality Date  . ABDOMINAL HYSTERECTOMY    . BALLOON DILATION  12/31/2011   Procedure: BALLOON DILATION;  Surgeon: Missy Sabins, MD;  Location: [facility] ENDOSCOPY;  Service: Endoscopy;  Laterality: N/A;  . CHOLECYSTECTOMY  07/28/11  . ESOPHAGOGASTRODUODENOSCOPY  10/17/2011   Procedure: ESOPHAGOGASTRODUODENOSCOPY (EGD);  Surgeon: Missy Sabins, MD;  Location: [facility] ENDOSCOPY;  Service: Endoscopy;  Laterality: N/A;  . IR ANGIO INTRA EXTRACRAN SEL INTERNAL CAROTID BILAT MOD SED  04/07/2017  . IR ANGIO INTRA EXTRACRAN SEL INTERNAL CAROTID BILAT MOD SED  01/07/2018  . IR ANGIO VERTEBRAL SEL VERTEBRAL UNI R MOD SED  04/07/2017  . IR ANGIO VERTEBRAL SEL VERTEBRAL UNI R MOD SED  01/07/2018  . IR ANGIOGRAM FOLLOW UP STUDY  04/07/2017  . IR ANGIOGRAM FOLLOW UP STUDY  04/07/2017  . IR ANGIOGRAM FOLLOW UP STUDY  04/07/2017  . IR ANGIOGRAM FOLLOW UP STUDY  04/07/2017  . IR ANGIOGRAM FOLLOW UP STUDY  04/07/2017  . IR ANGIOGRAM SELECTIVE EACH ADDITIONAL VESSEL  04/07/2017  . IR  TRANSCATH/EMBOLIZ  04/07/2017  . IR US GUIDE VASC ACCESS RIGHT  01/07/2018  . PARS PLANA VITRECTOMY Right 05/01/2017   Procedure: PARS PLANA VITRECTOMY WITH 25 GAUGE RIGHT EYE, endolaser photocoaglation;  Surgeon: Jalene Mullet, MD;  Location: [place];  Service: Ophthalmology;  Laterality: Right;  . RADIOLOGY WITH ANESTHESIA N/A 04/07/2017   Procedure: RADIOLOGY WITH ANESTHESIA;  Surgeon: Consuella Lose, MD;  Location: [place];  Service: Radiology;  Laterality: N/A;  . REPLACEMENT TOTAL KNEE BILATERAL  2004  . TEMPOROMANDIBULAR JOINT SURGERY     2 surgeries  . TUMOR REMOVAL    . VAGOTOMY  01/23/2012   Procedure: VAGOTOMY, antrectomy and BII;  Surgeon: Haywood Lasso, MD;  Location: [place];  Service: General;  Laterality: N/A;  Laparotomy with vagotomy.    Vitals:   03/24/18 1533  BP: 125/90  Pulse: 81    Subjective Assessment - 03/24/18 1533    Subjective  See's Dr. Letta Pate tomorrow. Going to ask about MRI of hip due to continued pain. No falls.     Patient is accompained by:  Family member    Limitations  Standing;Walking    Patient Stated Goals  To walk safely without walker, improve balance so no more falls.    Currently in Pain?  Yes    Pain Score  3  at rest;  10/10 with hip movement    Pain Location  Hip    Pain Orientation  Right    Pain Descriptors / Indicators  Throbbing;Stabbing;Constant    Pain Type  Chronic pain;Neuropathic pain    Pain Radiating Towards  thigh, stopping at back of knee    Pain Onset  More than a month ago    Pain Frequency  Constant    Aggravating Factors   walking, stretching    Pain Relieving Factors  rest            [facility] Adult PT Treatment/Exercise - 03/24/18 1539      Ambulation/Gait   Ambulation/Gait  Yes    Ambulation/Gait Assistance  4: Min assist    Ambulation/Gait Assistance Details  manual assist/depression provided to pelvis to failitate appropriate weight shifitng as pt tends to overshift to right side. cues needed to slow  down, focus on step placement/lenght and for increased knee flexion.     Ambulation Distance (Feet)  80 Feet    Assistive device  Rolling walker    Gait Pattern  Step-through pattern;Decreased step length - right;Decreased step length - left;Decreased stance time - left;Decreased stride length;Decreased hip/knee flexion - left;Ataxic;Decreased trunk rotation;Decreased weight shift to right    Ambulation Surface  Level;Indoor    Pre-Gait Activities  performed multiple reps in parallel bars without UE support: faciliaiton/cues on posture, pelvic stability, step length and for heel to toe step progression with improvement noted as reps progressed.       High Level Balance   High Level Balance Activities  Marching forwards;Backward walking;Side stepping side stepping in squat position    High Level Balance Comments  in parallel bars without UE support/touch as needed: 3-4 laps each with min guard to min assist for balance, cues on posture, ex form and weight shifting.      Acupuncturist Location  left anterior tibialis, left hamstring     Electrical Stimulation Action  for improved gait mechanics (increased DF and increased knee flexion with swing phase), improved knee control in stance, mm strengthening/re-education with session    Electrical Stimulation Parameters  refer to tablet 1 for adjusted parameters; quickfit     Electrical Stimulation Goals  Neuromuscular facilitation;Strength               PT Short Term Goals - 03/17/18 1516      PT SHORT TERM GOAL #1   Title  Pt will improve DGI to >/= 17/24 without AD    Time  4    Period  Weeks    Status  On-going    Target Date  04/11/18      PT SHORT TERM GOAL #2   Title  Pt will decrease falls risk with community gait as indicated by increase in gait velocity without device except Bioness to > or = 2.6 ft/sec    Time  4    Period  Weeks    Status  Revised    Target Date  04/11/18      PT SHORT  TERM GOAL #3   Title  Pt will decrease falls risk as indicated by increase in BERG balance score to > or = 49/56    Time  4    Period  Weeks    Status  Revised    Target Date  04/11/18      PT SHORT TERM GOAL #4   Title  Pt will ambulate >300' on indoor surfaces without  AD with Bioness with supervision    Time  4    Period  Weeks    Status  Revised    Target Date  04/11/18        PT Long Term Goals - 03/17/18 1115      PT LONG TERM GOAL #1   Title  Pt and husband will demonstrate independence with HEP, including use of treadmill    Baseline  03/12/18: met with current program issued to date- corner balance, treadmill routine    Time  8    Period  Weeks    Status  On-going    Target Date  05/09/18      PT LONG TERM GOAL #2   Title  Pt will demonstrate improved balance and decreased falls risk as indicated by BERG balance score of > or = 52/56    Baseline  03/12/18: 46/56 - no change from last assessment    Time  8    Period  Weeks    Status  On-going    Target Date  05/09/18      PT LONG TERM GOAL #3   Title  Pt will ambulate without AD x 350' over outdoor paved surfaces with supervision; will negotiate 4 stairs with one rail with alternating sequence and supervision    Baseline  03/12/18: can negotiate steps with single rail reciprocally with supervision, continues to vary in assistance needed with gait without AD on level indoor surfaces- min to mod at times    Time  8    Period  Weeks    Status  Revised    Target Date  05/09/18      PT LONG TERM GOAL #4   Title  Pt will report improvement in Neuro QOL-LE to >/= 40%    Baseline  03/17/2018 Neuro QOL 34.3    Time  8    Period  Weeks    Status  On-going    Target Date  05/09/18      PT LONG TERM GOAL #5   Title  Pt will decrease falls risk during gait in home/community as indicated by increase in gait velocity to > or = 3.0 ft/sec    Baseline  03/14/18: 2.51 ft/sec with RW, increased from 2.4 ft/sec at last assessment     Time  8    Status  On-going    Target Date  05/09/18      PT LONG TERM GOAL #6   Title  Pt will demonstrate decreased falls risk with gait as indicated by increase in DGI score to >/= 19/24 without AD    Baseline  03/14/18: 14/24 today, decreased by 1 point from last assessment.     Time  8    Period  Weeks    Status  On-going    Target Date  05/09/18         Plan - 03/24/18 1536    Clinical Impression Statement  Today's skilled session continued with use of Bioness to left LE with gait and balance activities. Still with difficulty eliciting good contractions with lower cuff on anterior tibialis, ? need for small round electrods (can be futher assess at next visit when Bioness rep is present). Pt continues to have increased anxiety with challenging activites (backward walking, new balance activities) needing encouragement and increased time/assist for balance at times. Pt is making slow, steady progress toward updated goals and should benefit from continued PT.  Rehab Potential  Good    PT Frequency  2x / week    PT Duration  8 weeks    PT Treatment/Interventions  ADLs/Self Care Home Management;Aquatic Therapy;DME Instruction;Gait training;Stair training;Functional mobility training;Therapeutic activities;Therapeutic exercise;Balance training;Neuromuscular re-education;Patient/family education;Vestibular;Visual/perceptual remediation/compensation;Cognitive remediation;Orthotic Fit/Training;Electrical Stimulation    PT Next Visit Plan  Check BP. continue with use of Bioness to left LE: gait on treamill both single and double LE's, gait with increased input through pelvis (tactile or weight?),; weight shifting on rockerboard.  Corner balance and finding midline    Consulted and Agree with Plan of Care  Patient;Family member/caregiver    Family Member Consulted  mother      Patient will benefit from skilled therapeutic intervention in order to improve the following deficits and  impairments:  Abnormal gait, Decreased balance, Decreased cognition, Decreased coordination, Decreased strength, Difficulty walking, Dizziness, Impaired sensation, Impaired vision/preception, Pain, Decreased activity tolerance, Decreased mobility, Decreased endurance, Postural dysfunction  Visit Diagnosis: Other lack of coordination  Unsteadiness on feet  Ataxic gait  Other abnormalities of gait and mobility  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 Weakness with dizziness-  since [facility] and CVA 04/07/17 08/07/2017  . Disturbances of vision, late effect of stroke 08/06/2017  . Health education/counseling 08/05/2017  . High risk medications (not anticoagulants) long-term use 08/05/2017  . Neuritis-  R sided:  arm and leg/ body due to stroke 08/05/2017  . Elevated LDL cholesterol level 07/11/2017  . [employer] [facility] (NIH) Stroke Scale limb ataxia score 2, ataxia present in two limbs 06/25/2017  . 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 Elevated vitamin B12 level 05/30/2017  . Vitamin D deficiency 05/29/2017  . History of tobacco abuse-  30pk yr hx - quit 04/07/17 05/21/2017  . Gait disturbance, post-stroke 05/14/2017  . Benign essential HTN   . Vitreous hemorrhage of right eye ([place])   . Adjustment disorder with mixed anxiety and depressed mood   . 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 s/p SAH (subarachnoid hemorrhage) ([place]) 04/19/2017  . 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 Hypoxia   . Subarachnoid hemorrhage due to ruptured aneurysm ([place]) 04/07/2017  . CVA (cerebral vascular accident) ([place]) 04/07/2017  . Elevated gastrin level 01/25/2012  . Hypokalemia 01/21/2012  . Nausea &  vomiting 01/20/2012  . Epigastric pain 01/20/2012  . Duodenal ulcer, acute with obstruction 10/17/2011  . S/P laparoscopic cholecystectomy 10/16/2011    Willow Ora, PTA, [facility] [address] [place], [place] [address] [phone] 03/25/18, 12:59 PM   Name: [name] Barnett MRN: [id number] Date of Birth: 1976/10/07

## 2018-03-25 NOTE — Progress Notes (Signed)
Subjective:    Patient ID: [name] Barnett, female    DOB: 06-Jan-1976, 41 y.o.   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  Also has right hemisensory deficits including pain and temperature HPI Chief complaint is right hip pain onset of pain was after coiling of basilar artery aneurysm.  Femoral nerve sheath.  5-hour relief after femoral nerve block. Patient with continued right hemisensory deficits pain and temperature above the knee and the thigh area on the right as well as right upper extremity as well as right hemifacial. Right groin pain increased with extension also sleeping on the right side. Patient has had several falls but she does not note any increased pain after fall. No significant low back pain.  Pain Inventory Average Pain 7 Pain Right Now 8 My pain is constant, sharp and aching  In the last 24 hours, has pain interfered with the following? General activity 4 Relation with others 4 Enjoyment of life 9 What TIME of day is your pain at its worst? all Sleep (in general) Good  Pain is worse with: walking, bending, sitting, inactivity, standing and some activites Pain improves with: nothing Relief from Meds: 0  Mobility walk with assistance use a walker how many minutes can you walk? 30+ ability to climb steps?  yes do you drive?  no Do you have any goals in this area?  yes  Function I need assistance with the following:  meal prep, household duties and shopping  Neuro/Psych bladder control problems dizziness  Prior Studies Any changes since last visit?  no  Physicians involved in your care Any changes since last visit?  no   Family History  Problem Relation Age of Onset  . Hypertension Mother   . Restless legs syndrome Mother   . Hyperlipidemia Mother   . Heart disease Father   . Malignant hyperthermia  Neg Hx    Social History   Socioeconomic History  . Marital status: Married    Spouse name: Not on file  . Number of children: Not on file  . Years of education: Not on file  . Highest education level: Not on file  Occupational History  . Not on file  Social Needs  . Financial resource strain: Not on file  . Food insecurity:    Worry: Not on file    Inability: Not on file  . Transportation needs:    Medical: Not on file    Non-medical: Not on file  Tobacco Use  . Smoking status: Former Smoker    Packs/day: 1.00    Years: 20.00    Pack years: 20.00    Types: Cigarettes    Last attempt to quit: 04/07/2017    Years since quitting: 0.9  . Smokeless tobacco: Never Used  Substance and Sexual Activity  . Alcohol use: No  . Drug use: No  . Sexual activity: Not Currently    Partners: Male  Lifestyle  . Physical activity:    Days per week: Not on file    Minutes per session: Not on file  . Stress: Not on file  Relationships  . Social connections:    Talks on phone: Not on file    Gets together: Not on file    Attends religious service: Not on file    Active member of club or organization: Not on file    Attends meetings of  clubs or organizations: Not on file    Relationship status: Not on file  Other Topics Concern  . Not on file  Social History Narrative   [place] 2 [place]. 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   Past Surgical History:  Procedure Laterality Date  . ABDOMINAL HYSTERECTOMY    . BALLOON DILATION  12/31/2011   Procedure: BALLOON DILATION;  Surgeon: Barrie FolkJohn C Hayes, MD;  [facility];  Service: Endoscopy;  Laterality: N/A;  . CHOLECYSTECTOMY  07/28/11  . ESOPHAGOGASTRODUODENOSCOPY  10/17/2011   Procedure: ESOPHAGOGASTRODUODENOSCOPY (EGD);  Surgeon: Barrie FolkJohn C Hayes, MD;  [facility];  Service: Endoscopy;  Laterality: N/A;  . IR ANGIO INTRA EXTRACRAN SEL INTERNAL CAROTID BILAT MOD SED  04/07/2017  . IR ANGIO INTRA EXTRACRAN SEL  INTERNAL CAROTID BILAT MOD SED  01/07/2018  . IR ANGIO VERTEBRAL SEL VERTEBRAL UNI R MOD SED  04/07/2017  . IR ANGIO VERTEBRAL SEL VERTEBRAL UNI R MOD SED  01/07/2018  . IR ANGIOGRAM FOLLOW UP STUDY  04/07/2017  . IR ANGIOGRAM FOLLOW UP STUDY  04/07/2017  . IR ANGIOGRAM FOLLOW UP STUDY  04/07/2017  . IR ANGIOGRAM FOLLOW UP STUDY  04/07/2017  . IR ANGIOGRAM FOLLOW UP STUDY  04/07/2017  . IR ANGIOGRAM SELECTIVE EACH ADDITIONAL VESSEL  04/07/2017  . IR TRANSCATH/EMBOLIZ  04/07/2017  . IR US GUIDE VASC ACCESS RIGHT  01/07/2018  . PARS PLANA VITRECTOMY Right 05/01/2017   Procedure: PARS PLANA VITRECTOMY WITH 25 GAUGE RIGHT EYE, endolaser photocoaglation;  Surgeon: Carmela RimaPatel, Narendra, MD;  [facility];  Service: Ophthalmology;  Laterality: Right;  . RADIOLOGY WITH ANESTHESIA N/A 04/07/2017   Procedure: RADIOLOGY WITH ANESTHESIA;  Surgeon: [name], MD;  [facility];  Service: Radiology;  Laterality: N/A;  . REPLACEMENT TOTAL KNEE BILATERAL  2004  . TEMPOROMANDIBULAR JOINT SURGERY     2 surgeries  . TUMOR REMOVAL    . VAGOTOMY  01/23/2012   Procedure: VAGOTOMY, antrectomy and BII;  Surgeon: [name], MD;  Location: [facility] OR;  Service: General;  Laterality: N/A;  Laparotomy with vagotomy.   Past Medical History:  Diagnosis Date  . Duodenal obstruction   . GERD (gastroesophageal reflux disease)   . Hypertension   . Stroke ([name])    april 2018   BP 131/85 (BP Location: Right Arm, Patient Position: Sitting, Barnett Size: Normal)   Pulse 71   Resp 14   Ht 5\' 4"  (1.626 m)   Wt 178 lb (80.7 kg)   SpO2 98%   BMI 30.55 kg/m   Opioid Risk Score:   Fall Risk Score:  `1  Depression screen PHQ 2/9  [facility] 02/17/2018 01/13/2018 08/01/2017 07/11/2017 05/14/2017  Decreased Interest 2 1 0 2 3  Down, Depressed, Hopeless 1 0 0 1 2  PHQ - 2 Score 3 1 0 3 5  Altered sleeping 0 0 0 0 3  Tired, decreased energy 2 3 1 3 3   Change in appetite 0 0 0 0 1  Feeling bad or failure about  yourself  0 0 0 0 1  Trouble concentrating 0 0 0 1 2  Moving slowly or fidgety/restless 0 0 0 3 2  Suicidal thoughts 0 0 0 1 0  PHQ-9 Score 5 4 1 11 17   Difficult doing work/chores Not difficult at all Somewhat difficult - - Very difficult    Review of Systems  Constitutional: Negative.   HENT: Negative.   Eyes: Negative.  Respiratory: Negative.   Cardiovascular: Negative.   Gastrointestinal: Negative.   Endocrine: Negative.   Genitourinary: Positive for difficulty urinating.  Musculoskeletal: Positive for arthralgias and gait problem.  Skin: Negative.   Allergic/Immunologic: Negative.   Neurological: Positive for dizziness.  Hematological: Negative.   Psychiatric/Behavioral: Negative.   All other systems reviewed and are negative.      Objective:   Physical Exam  General no acute distress Mood and affect emotional lability. Motor strength is 4/5 in the right hip flexor knee extensor ankle dorsiflexor Decreased sensation right anterior thigh compared to left anterior thigh normal sensation below the knee bilaterally.  Normal sensation left upper extremity but reduced right upper extremity as well as right hemifacial. Left-sided hemi-ataxia Ambulates with a walker she has a waddling type of gait i.e. bilateral Trendelenburg No toe drag or knee instability. Patient has tenderness over the right greater trochanter.  Patient has pain with extension of the right hip in the groin area.  There is limitation of range of motion right hip internal rotation and right hip external rotation.  Positive Faber's at the groin on the right.  Negative straight leg raising       Assessment & Plan:  #1.  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  Hemisensory deficits persist on the right side however surprisingly spare the right leg. Left hemi-ataxia persists, patient continues with outpatient therapy.  We discussed that her sensory deficits are  likely to be permanent  2.  Right hip pain I believe this is multifactorial.  She did have a femoral nerve sheath and pain seemed to start after the procedure she also has dysesthesias related to the stroke.  Patient had temporary relief of the anterior thigh pain after femoral nerve block during the anesthetic phase.  In addition she has limitation of right hip range of motion internal and external rotation which I would not expect with the femoral nerve injury.  We discussed the fact that she has had several falls however she does not note any sudden increase of her hip pain after a fall.  Nevertheless I think it is wise to get x-rays.  She will return and review results with me.  If no obvious abnormalities, would then likely need to proceed with Right hip  MRI.   Over half of the 25 min visit was spent counseling and coordinating care.

## 2018-03-25 NOTE — Patient Instructions (Signed)
Tylenol 500mg  2 tabs up to 4 times a day

## 2018-03-26 ENCOUNTER — Ambulatory Visit: Payer: BLUE CROSS/BLUE SHIELD | Admitting: Physical Therapy

## 2018-03-27 ENCOUNTER — Encounter: Payer: Self-pay | Admitting: Physical Therapy

## 2018-03-27 ENCOUNTER — Other Ambulatory Visit: Payer: Self-pay | Admitting: Family Medicine

## 2018-03-27 ENCOUNTER — Ambulatory Visit: Payer: BLUE CROSS/BLUE SHIELD | Admitting: Physical Therapy

## 2018-03-27 VITALS — BP 137/88 | HR 81

## 2018-03-27 DIAGNOSIS — R278 Other lack of coordination: Secondary | ICD-10-CM

## 2018-03-27 DIAGNOSIS — R2681 Unsteadiness on feet: Secondary | ICD-10-CM

## 2018-03-27 DIAGNOSIS — R260 Ataxic gait: Secondary | ICD-10-CM

## 2018-03-27 DIAGNOSIS — I10 Essential (primary) hypertension: Secondary | ICD-10-CM

## 2018-03-27 DIAGNOSIS — R2689 Other abnormalities of gait and mobility: Secondary | ICD-10-CM

## 2018-03-27 NOTE — Therapy (Signed)
[facility] [address] [place], [place], [address] Phone: [phone]   Fax:  [phone]  Physical Therapy Treatment  Patient Details  Name: [name] Barnett MRN: [id number] Date of Birth: [birth year] Referring Provider: Charlett Blake, MD   Encounter Date: 03/27/2018  PT End of Session - 03/27/18 1942    Visit Number  73    Number of Visits  70    Date for PT Re-Evaluation  05/16/18 date corrected to reflect 2 day cert    Authorization Type  [employer]    PT Start Time  1540    PT Stop Time  1625    PT Time Calculation (min)  45 min    Equipment Utilized During Treatment  Gait belt    Activity Tolerance  Patient tolerated treatment well    Behavior During Therapy  [facility] for tasks assessed/performed       Past Medical History:  Diagnosis Date  . Duodenal obstruction   . GERD (gastroesophageal reflux disease)   . Hypertension   . Stroke [facility])    april 2018    Past Surgical History:  Procedure Laterality Date  . ABDOMINAL HYSTERECTOMY    . BALLOON DILATION  12/31/2011   Procedure: BALLOON DILATION;  Surgeon: Missy Sabins, MD;  Location: [facility] ENDOSCOPY;  Service: Endoscopy;  Laterality: N/A;  . CHOLECYSTECTOMY  07/28/11  . ESOPHAGOGASTRODUODENOSCOPY  10/17/2011   Procedure: ESOPHAGOGASTRODUODENOSCOPY (EGD);  Surgeon: Missy Sabins, MD;  Location: [facility] ENDOSCOPY;  Service: Endoscopy;  Laterality: N/A;  . IR ANGIO INTRA EXTRACRAN SEL INTERNAL CAROTID BILAT MOD SED  04/07/2017  . IR ANGIO INTRA EXTRACRAN SEL INTERNAL CAROTID BILAT MOD SED  01/07/2018  . IR ANGIO VERTEBRAL SEL VERTEBRAL UNI R MOD SED  04/07/2017  . IR ANGIO VERTEBRAL SEL VERTEBRAL UNI R MOD SED  01/07/2018  . IR ANGIOGRAM FOLLOW UP STUDY  04/07/2017  . IR ANGIOGRAM FOLLOW UP STUDY  04/07/2017  . IR ANGIOGRAM FOLLOW UP STUDY  04/07/2017  . IR ANGIOGRAM FOLLOW UP STUDY  04/07/2017  . IR ANGIOGRAM FOLLOW UP STUDY  04/07/2017  . IR ANGIOGRAM SELECTIVE EACH ADDITIONAL  VESSEL  04/07/2017  . IR TRANSCATH/EMBOLIZ  04/07/2017  . IR US GUIDE VASC ACCESS RIGHT  01/07/2018  . PARS PLANA VITRECTOMY Right 05/01/2017   Procedure: PARS PLANA VITRECTOMY WITH 25 GAUGE RIGHT EYE, endolaser photocoaglation;  Surgeon: Jalene Mullet, MD;  Location: [facility];  Service: Ophthalmology;  Laterality: Right;  . RADIOLOGY WITH ANESTHESIA N/A 04/07/2017   Procedure: RADIOLOGY WITH ANESTHESIA;  Surgeon: Consuella Lose, MD;  Location: [facility];  Service: Radiology;  Laterality: N/A;  . REPLACEMENT TOTAL KNEE BILATERAL  2004  . TEMPOROMANDIBULAR JOINT SURGERY     2 surgeries  . TUMOR REMOVAL    . VAGOTOMY  01/23/2012   Procedure: VAGOTOMY, antrectomy and BII;  Surgeon: Haywood Lasso, MD;  Location: [facility];  Service: General;  Laterality: N/A;  Laparotomy with vagotomy.    Vitals:   03/27/18 1924  BP: 137/88  Pulse: 81    Subjective Assessment - 03/27/18 1925    Subjective  Dr. Letta Pate agreed to xray of hip; going on Tuesday.  Went to the hemp store and bought CBD cream for her RLE.  Helps with mm pain in R thigh but not the pain in her groin.    Patient is accompained by:  Family member    Limitations  Standing;Walking    Patient Stated Goals  To walk  safely without walker, improve balance so no more falls.    Currently in Pain?  Yes    Pain Score  4     Pain Location  Hip    Pain Orientation  Right    Pain Descriptors / Indicators  Sharp    Pain Type  Chronic pain    Pain Onset  More than a month ago                       [facility] Adult PT Treatment/Exercise - 03/27/18 1927      Ambulation/Gait   Ambulation/Gait  Yes    Ambulation/Gait Assistance  4: Min assist    Ambulation/Gait Assistance Details  with use of Bioness in gait mode without RW but with L HHA and then with RW to determine carryover; Bioness adjusted to prolong L ankle DF through stance and initiated hamstring activation earlier for increased terminal hip extension on L to increase weight  shift and stance time on LLE with facilitation of therapist.      Ambulation Distance (Feet)  300 Feet    Assistive device  Rolling walker;1 person hand held assist    Gait Pattern  Step-through pattern;Ataxic;Decreased weight shift to left;Trunk flexed;Wide base of support    Ambulation Surface  Level;Indoor    Stairs  Yes    Stairs Assistance  4: Min guard    Stairs Assistance Details (indicate cue type and reason)  with use of Bioness in gait mode, use of alternating sequence to focus on hamstring and DF activation for improved sequencing during advancement     Stair Management Technique  Two rails;Alternating pattern;Forwards    Number of Stairs  8    Height of Stairs  6      Neuro Re-ed    Neuro Re-ed Details   Bioness in training mode during tall kneeling performing side squats to L, reaching R for increased weight shift to L with trunk elongation; also performed transition from tall kneeling to half kneeling bringing RLE forwards to continue to focus on weight shift and stability through LLE.        Modalities   Modalities  Electrical Stimulation      Electrical Stimulation   Electrical Stimulation Location  left anterior tibialis, left hamstring     Electrical Stimulation Action  open and closed chain ankle DF, knee flexion and hip extension; for proprioceptive input to RLE and improve coordination and motor planning    Electrical Stimulation Parameters  adjusted parameters in Tablet 1; raised position of lower cuff for improved L ankle DF activation and improved tolerance of stimulation    Electrical Stimulation Goals  Neuromuscular facilitation;Strength             PT Education - 03/27/18 1941    Education provided  Yes    Education Details  Bioness training mode, gait mode.  May consider home Bioness unit    Person(s) Educated  Patient;Spouse    Methods  Explanation;Demonstration    Comprehension  Need further instruction       PT Short Term Goals - 03/27/18 1955       PT SHORT TERM GOAL #1   Title  Pt will improve DGI to >/= 17/24 without AD    Time  4    Period  Weeks    Status  On-going    Target Date  04/16/18      PT SHORT TERM GOAL #2   Title  Pt  will decrease falls risk with community gait as indicated by increase in gait velocity without device except Bioness to > or = 2.6 ft/sec    Time  4    Period  Weeks    Status  Revised    Target Date  04/16/18      PT SHORT TERM GOAL #3   Title  Pt will decrease falls risk as indicated by increase in BERG balance score to > or = 49/56    Time  4    Period  Weeks    Status  Revised    Target Date  04/16/18      PT SHORT TERM GOAL #4   Title  Pt will ambulate >300' on indoor surfaces without AD with Bioness with supervision    Time  4    Period  Weeks    Status  Revised    Target Date  04/16/18        PT Long Term Goals - 03/27/18 1955      PT LONG TERM GOAL #1   Title  Pt and husband will demonstrate independence with HEP, including use of treadmill    Baseline  03/12/18: met with current program issued to date- corner balance, treadmill routine    Time  8    Period  Weeks    Status  On-going    Target Date  05/16/18      PT LONG TERM GOAL #2   Title  Pt will demonstrate improved balance and decreased falls risk as indicated by BERG balance score of > or = 52/56    Baseline  03/12/18: 46/56 - no change from last assessment    Time  8    Period  Weeks    Status  On-going    Target Date  05/16/18      PT LONG TERM GOAL #3   Title  Pt will ambulate without AD x 350' over outdoor paved surfaces with supervision; will negotiate 4 stairs with one rail with alternating sequence and supervision    Baseline  03/12/18: can negotiate steps with single rail reciprocally with supervision, continues to vary in assistance needed with gait without AD on level indoor surfaces- min to mod at times    Time  8    Period  Weeks    Status  Revised    Target Date  05/16/18      PT LONG TERM GOAL #4    Title  Pt will report improvement in Neuro QOL-LE to >/= 40%    Baseline  03/17/2018 Neuro QOL 34.3    Time  8    Period  Weeks    Status  On-going    Target Date  05/16/18      PT LONG TERM GOAL #5   Title  Pt will decrease falls risk during gait in home/community as indicated by increase in gait velocity to > or = 3.0 ft/sec    Baseline  03/14/18: 2.51 ft/sec with RW, increased from 2.4 ft/sec at last assessment    Time  8    Status  On-going    Target Date  05/16/18      PT LONG TERM GOAL #6   Title  Pt will demonstrate decreased falls risk with gait as indicated by increase in DGI score to >/= 19/24 without AD    Baseline  03/14/18: 14/24 today, decreased by 1 point from last assessment.     Time  8  Period  Weeks    Status  On-going    Target Date  05/16/18            Plan - 03/27/18 1942    Clinical Impression Statement  Continued gait and stair training and LLE NMR with use of Bioness functional electrical stimulation in conjunction with Bioness representative to assist with adjustment of cuff placement and settings to improve patient tolerance of stimulation.  With quick fit electrode able to elicit ankle DF contraction and pt able to tolerate full session of gait, stair training and NMR In tall kneeling/half kneeling.  Due to improved tolerance and gait, may consider and discuss with pt for home use.    Rehab Potential  Good    PT Frequency  2x / week    PT Duration  8 weeks    PT Treatment/Interventions  ADLs/Self Care Home Management;Aquatic Therapy;DME Instruction;Gait training;Stair training;Functional mobility training;Therapeutic activities;Therapeutic exercise;Balance training;Neuromuscular re-education;Patient/family education;Vestibular;Visual/perceptual remediation/compensation;Cognitive remediation;Orthotic Fit/Training;Electrical Stimulation    PT Next Visit Plan  Check BP. continue with use of Bioness to left LE: gait on treamill both single and double LE's,  LLE on treadmill moving belt but without belt on; tall and half kneeling, use of theraband or sports cord to force weight shift to L, weight shifting on rockerboard.  Corner balance and finding midline    Consulted and Agree with Plan of Care  Patient;Family member/caregiver    Family Member Consulted  husband       Patient will benefit from skilled therapeutic intervention in order to improve the following deficits and impairments:  Abnormal gait, Decreased balance, Decreased cognition, Decreased coordination, Decreased strength, Difficulty walking, Dizziness, Impaired sensation, Impaired vision/preception, Pain, Decreased activity tolerance, Decreased mobility, Decreased endurance, Postural dysfunction  Visit Diagnosis: Ataxic gait  Other abnormalities of gait and mobility  Other lack of coordination  Unsteadiness on feet     Problem List Patient Active Problem List   Diagnosis Date Noted  . 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 Weakness with dizziness-  since [facility] and CVA 04/07/17 08/07/2017  . Disturbances of vision, late effect of stroke 08/06/2017  . Health education/counseling 08/05/2017  . High risk medications (not anticoagulants) long-term use 08/05/2017  . Neuritis-  R sided:  arm and leg/ body due to stroke 08/05/2017  . Elevated LDL cholesterol level 07/11/2017  . [employer] (NIH) Stroke Scale limb ataxia score 2, ataxia present in two limbs 06/25/2017  . 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 Elevated vitamin B12 level 05/30/2017  . Vitamin D deficiency 05/29/2017  . History of tobacco abuse-  30pk yr hx - quit 04/07/17 05/21/2017  . Gait disturbance, post-stroke 05/14/2017  . Benign essential HTN   . Vitreous hemorrhage of right eye ([place])   . Adjustment disorder with mixed anxiety and depressed mood   . 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 s/p SAH  (subarachnoid hemorrhage) ([place]) 04/19/2017  . 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 Hypoxia   . Subarachnoid hemorrhage due to ruptured aneurysm ([place]) 04/07/2017  . CVA (cerebral vascular accident) ([place]) 04/07/2017  . Elevated gastrin level 01/25/2012  . Hypokalemia 01/21/2012  . Nausea & vomiting 01/20/2012  . Epigastric pain 01/20/2012  . Duodenal ulcer, acute with obstruction 10/17/2011  . S/P laparoscopic cholecystectomy 10/16/2011    Rico Junker, PT, DPT 03/27/18    7:57 PM    [facility]  [facility] [address] [place], [place], [address] Phone: [phone]   Fax:  [phone]  Name: ANNA BEAIRD MRN: [id number] Date of Birth: [birth year]

## 2018-04-01 ENCOUNTER — Ambulatory Visit: Payer: BLUE CROSS/BLUE SHIELD | Admitting: Physical Therapy

## 2018-04-01 DIAGNOSIS — R260 Ataxic gait: Secondary | ICD-10-CM | POA: Diagnosis not present

## 2018-04-01 DIAGNOSIS — R2681 Unsteadiness on feet: Secondary | ICD-10-CM

## 2018-04-01 DIAGNOSIS — R2689 Other abnormalities of gait and mobility: Secondary | ICD-10-CM

## 2018-04-01 DIAGNOSIS — R278 Other lack of coordination: Secondary | ICD-10-CM

## 2018-04-01 NOTE — Therapy (Signed)
[name] Barnett [address] [place], [place], [address] Phone: [phone]   Fax:  [phone]  Physical Therapy Treatment  Patient Details  Name: [name] Barnett MRN: [id number] Date of Birth: [birth year] Referring Provider: Charlett Blake, MD   Encounter Date: 04/01/2018  PT End of Session - 04/01/18 2056    Visit Number  60    Number of Visits  21    Date for PT Re-Evaluation  05/16/18 date corrected to reflect 76 day cert    Authorization Type  [employer]    PT Start Time  1540    PT Stop Time  1630    PT Time Calculation (min)  50 min    Equipment Utilized During Treatment  Gait belt    Activity Tolerance  Patient tolerated treatment well    Behavior During Therapy  [facility] for tasks assessed/performed       Past Medical History:  Diagnosis Date  . Duodenal obstruction   . GERD (gastroesophageal reflux disease)   . Hypertension   . Stroke [facility])    april 2018    Past Surgical History:  Procedure Laterality Date  . ABDOMINAL HYSTERECTOMY    . BALLOON DILATION  12/31/2011   Procedure: BALLOON DILATION;  Surgeon: Missy Sabins, MD;  Location: [facility] ENDOSCOPY;  Service: Endoscopy;  Laterality: N/A;  . CHOLECYSTECTOMY  07/28/11  . ESOPHAGOGASTRODUODENOSCOPY  10/17/2011   Procedure: ESOPHAGOGASTRODUODENOSCOPY (EGD);  Surgeon: Missy Sabins, MD;  Location: [facility] ENDOSCOPY;  Service: Endoscopy;  Laterality: N/A;  . IR ANGIO INTRA EXTRACRAN SEL INTERNAL CAROTID BILAT MOD SED  04/07/2017  . IR ANGIO INTRA EXTRACRAN SEL INTERNAL CAROTID BILAT MOD SED  01/07/2018  . IR ANGIO VERTEBRAL SEL VERTEBRAL UNI R MOD SED  04/07/2017  . IR ANGIO VERTEBRAL SEL VERTEBRAL UNI R MOD SED  01/07/2018  . IR ANGIOGRAM FOLLOW UP STUDY  04/07/2017  . IR ANGIOGRAM FOLLOW UP STUDY  04/07/2017  . IR ANGIOGRAM FOLLOW UP STUDY  04/07/2017  . IR ANGIOGRAM FOLLOW UP STUDY  04/07/2017  . IR ANGIOGRAM FOLLOW UP STUDY  04/07/2017  . IR ANGIOGRAM SELECTIVE EACH ADDITIONAL  VESSEL  04/07/2017  . IR TRANSCATH/EMBOLIZ  04/07/2017  . IR US GUIDE VASC ACCESS RIGHT  01/07/2018  . PARS PLANA VITRECTOMY Right 05/01/2017   Procedure: PARS PLANA VITRECTOMY WITH 25 GAUGE RIGHT EYE, endolaser photocoaglation;  Surgeon: Jalene Mullet, MD;  Location: [place];  Service: Ophthalmology;  Laterality: Right;  . RADIOLOGY WITH ANESTHESIA N/A 04/07/2017   Procedure: RADIOLOGY WITH ANESTHESIA;  Surgeon: Consuella Lose, MD;  Location: [place];  Service: Radiology;  Laterality: N/A;  . REPLACEMENT TOTAL KNEE BILATERAL  2004  . TEMPOROMANDIBULAR JOINT SURGERY     2 surgeries  . TUMOR REMOVAL    . VAGOTOMY  01/23/2012   Procedure: VAGOTOMY, antrectomy and BII;  Surgeon: Haywood Lasso, MD;  Location: [place];  Service: General;  Laterality: N/A;  Laparotomy with vagotomy.    There were no vitals filed for this visit.  Subjective Assessment - 04/01/18 1545    Subjective  RLE felt better after Thursday's session and was pain free for 3 days; soreness returned today but not severe, especially in flexion, ABD, ER.  Will have to wait for appointment with Dr. Letta Pate in May for assessment of x-ray and to determine next steps with assessing hip pain.    Patient is accompained by:  Family member    Limitations  Standing;Walking  Patient Stated Goals  To walk safely without walker, improve balance so no more falls.    Currently in Pain?  Yes                       [place] Adult PT Treatment/Exercise - 04/01/18 2044      Transfers   Floor to Transfer  4: Min guard    Floor to Transfer Details (indicate cue type and reason)  from tall > half kneeling to stand on RLE      Ambulation/Gait   Ambulation/Gait  Yes    Ambulation/Gait Assistance  3: Mod assist    Ambulation/Gait Assistance Details  Use of Bioness in gait mode, no AD but with patient placing LUE around therapist to allow increased control and feedback for greater weight shifting to L, increased stance time on L and  decreased width of BOS.  Bioness provided increased proprioceptive input, timing/sequencing of ankle DF and hamstring activation during gait    Ambulation Distance (Feet)  300 Feet    Assistive device  None    Gait Pattern  Step-through pattern;Decreased stance time - left;Decreased weight shift to left;Wide base of support;Abducted - left    Ambulation Surface  Level;Indoor      Exercises   Other Exercises   educated pt on how to perform tall kneeling and half kneeling safely at home.  Pt return demonstrated on floor with knees on pillows and UE support on mat.  Performed tall kneeling squats to L and R side (which also assists in stretching R hip providing pain relief) and transitioning from tall kneeling > half kneeling on LLE bringing RLE forwards and balancing with one UE support to focus on postural control and weight shift to LLE.        Modalities   Modalities  Electrical Stimulation      Electrical Stimulation   Electrical Stimulation Location  left anterior tibialis, left hamstring     Electrical Stimulation Action  open and closed chain ankle DF, knee flexion and hip extension; for proprioceptive input to RLE and improve coordination and motor planning    Electrical Stimulation Parameters  see parameters saved in Tablet 1; quick fit electrode    Electrical Stimulation Goals  Neuromuscular facilitation;Strength          Balance Exercises - 04/01/18 2050      Balance Exercises: Standing   Tandem Gait  Forward;Upper extremity support;5 reps with Bioness in training mode        PT Education - 04/01/18 2056    Education provided  Yes    Education Details  performing tall and half kneeling at home on floor    Person(s) Educated  Patient    Methods  Explanation;Demonstration    Comprehension  Verbalized understanding;Returned demonstration       PT Short Term Goals - 03/27/18 1955      PT SHORT TERM GOAL #1   Title  Pt will improve DGI to >/= 17/24 without AD    Time   4    Period  Weeks    Status  On-going    Target Date  04/16/18      PT SHORT TERM GOAL #2   Title  Pt will decrease falls risk with community gait as indicated by increase in gait velocity without device except Bioness to > or = 2.6 ft/sec    Time  4    Period  Weeks    Status  Revised  Target Date  04/16/18      PT SHORT TERM GOAL #3   Title  Pt will decrease falls risk as indicated by increase in BERG balance score to > or = 49/56    Time  4    Period  Weeks    Status  Revised    Target Date  04/16/18      PT SHORT TERM GOAL #4   Title  Pt will ambulate >300' on indoor surfaces without AD with Bioness with supervision    Time  4    Period  Weeks    Status  Revised    Target Date  04/16/18        PT Long Term Goals - 03/27/18 1955      PT LONG TERM GOAL #1   Title  Pt and husband will demonstrate independence with HEP, including use of treadmill    Baseline  03/12/18: met with current program issued to date- corner balance, treadmill routine    Time  8    Period  Weeks    Status  On-going    Target Date  05/16/18      PT LONG TERM GOAL #2   Title  Pt will demonstrate improved balance and decreased falls risk as indicated by BERG balance score of > or = 52/56    Baseline  03/12/18: 46/56 - no change from last assessment    Time  8    Period  Weeks    Status  On-going    Target Date  05/16/18      PT LONG TERM GOAL #3   Title  Pt will ambulate without AD x 350' over outdoor paved surfaces with supervision; will negotiate 4 stairs with one rail with alternating sequence and supervision    Baseline  03/12/18: can negotiate steps with single rail reciprocally with supervision, continues to vary in assistance needed with gait without AD on level indoor surfaces- min to mod at times    Time  8    Period  Weeks    Status  Revised    Target Date  05/16/18      PT LONG TERM GOAL #4   Title  Pt will report improvement in Neuro QOL-LE to >/= 40%    Baseline  03/17/2018 Neuro  QOL 34.3    Time  8    Period  Weeks    Status  On-going    Target Date  05/16/18      PT LONG TERM GOAL #5   Title  Pt will decrease falls risk during gait in home/community as indicated by increase in gait velocity to > or = 3.0 ft/sec    Baseline  03/14/18: 2.51 ft/sec with RW, increased from 2.4 ft/sec at last assessment    Time  8    Status  On-going    Target Date  05/16/18      PT LONG TERM GOAL #6   Title  Pt will demonstrate decreased falls risk with gait as indicated by increase in DGI score to >/= 19/24 without AD    Baseline  03/14/18: 14/24 today, decreased by 1 point from last assessment.     Time  8    Period  Weeks    Status  On-going    Target Date  05/16/18            Plan - 04/01/18 2057    Clinical Impression Statement  Continued gait and dynamic balance training with  use of Bioness functional e-stim in gait and training mode; pt continues to require cues to decrease width of BOS to allow full L lateral and anterior weight shifting.  Due to pt reporting significant improvement in R hip pain after performing tall kneeling last session, pt taught how to perform at home safely; following brief exercises in tall kneeling today pt reported improvement in pain.  Will continue to address and progress towards LTG.    Rehab Potential  Good    PT Frequency  2x / week    PT Duration  8 weeks    PT Treatment/Interventions  ADLs/Self Care Home Management;Aquatic Therapy;DME Instruction;Gait training;Stair training;Functional mobility training;Therapeutic activities;Therapeutic exercise;Balance training;Neuromuscular re-education;Patient/family education;Vestibular;Visual/perceptual remediation/compensation;Cognitive remediation;Orthotic Fit/Training;Electrical Stimulation    PT Next Visit Plan  Home Bioness unit?  Check BP. continue with use of Bioness to left LE: gait on treamill both single and double LE's, LLE on treadmill moving belt but without belt on; tall and half  kneeling, use of theraband or sports cord to force weight shift to L, weight shifting on rockerboard.  Corner balance and finding midline    Consulted and Agree with Plan of Care  Patient;Family member/caregiver    Family Member Consulted  daughter and mother       Patient will benefit from skilled therapeutic intervention in order to improve the following deficits and impairments:  Abnormal gait, Decreased balance, Decreased cognition, Decreased coordination, Decreased strength, Difficulty walking, Dizziness, Impaired sensation, Impaired vision/preception, Pain, Decreased activity tolerance, Decreased mobility, Decreased endurance, Postural dysfunction  Visit Diagnosis: Ataxic gait  Other abnormalities of gait and mobility  Other lack of coordination  Unsteadiness on feet     Problem List Patient Active Problem List   Diagnosis Date Noted  . 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 Weakness with dizziness-  since [facility] and CVA 04/07/17 08/07/2017  . Disturbances of vision, late effect of stroke 08/06/2017  . Health education/counseling 08/05/2017  . High risk medications (not anticoagulants) long-term use 08/05/2017  . Neuritis-  R sided:  arm and leg/ body due to stroke 08/05/2017  . Elevated LDL cholesterol level 07/11/2017  . [employer] [facility] (NIH) Stroke Scale limb ataxia score 2, ataxia present in two limbs 06/25/2017  . 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 Elevated vitamin B12 level 05/30/2017  . Vitamin D deficiency 05/29/2017  . History of tobacco abuse-  30pk yr hx - quit 04/07/17 05/21/2017  . Gait disturbance, post-stroke 05/14/2017  . Benign essential HTN   . Vitreous hemorrhage of right eye ([employer])   . Adjustment disorder with mixed anxiety and depressed mood   . 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 s/p SAH (subarachnoid hemorrhage) ([place]) 04/19/2017  . 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 Hypoxia   . Subarachnoid hemorrhage due to ruptured aneurysm ([place]) 04/07/2017  . CVA (cerebral vascular accident) ([place]) 04/07/2017  . Elevated gastrin level 01/25/2012  . Hypokalemia 01/21/2012  . Nausea & vomiting 01/20/2012  . Epigastric pain 01/20/2012  . Duodenal ulcer, acute with obstruction 10/17/2011  . S/P laparoscopic cholecystectomy 10/16/2011    Rico Junker, PT, DPT 04/01/18    9:07 PM    [place] [address] [place], [place], [address] Phone: [phone]   Fax:  [phone]  Name: SOLANA COGGIN MRN: [id number] Date of Birth: 1976/07/17

## 2018-04-03 ENCOUNTER — Ambulatory Visit: Payer: BLUE CROSS/BLUE SHIELD | Admitting: Physical Therapy

## 2018-04-03 DIAGNOSIS — R42 Dizziness and giddiness: Secondary | ICD-10-CM

## 2018-04-03 DIAGNOSIS — R2681 Unsteadiness on feet: Secondary | ICD-10-CM

## 2018-04-03 DIAGNOSIS — R2689 Other abnormalities of gait and mobility: Secondary | ICD-10-CM

## 2018-04-03 DIAGNOSIS — R278 Other lack of coordination: Secondary | ICD-10-CM

## 2018-04-03 DIAGNOSIS — R260 Ataxic gait: Secondary | ICD-10-CM

## 2018-04-03 NOTE — Therapy (Signed)
[facility] [address] [place], [place], [address] Phone: [phone]   Fax:  [phone]  Physical Therapy Treatment  Patient Details  Name: [name] Barnett MRN: [id number] Date of Birth: 01/28/1976 Referring Provider: Charlett Blake, MD   Encounter Date: 04/03/2018  PT End of Session - 04/03/18 2107    Visit Number  58    Number of Visits  59    Date for PT Re-Evaluation  05/16/18 date corrected to reflect 41 day cert    Authorization Type  [employer]    PT Start Time  1536    PT Stop Time  1630    PT Time Calculation (min)  54 min    Activity Tolerance  Patient tolerated treatment well    Behavior During Therapy  [facility] for tasks assessed/performed       Past Medical History:  Diagnosis Date  . Duodenal obstruction   . GERD (gastroesophageal reflux disease)   . Hypertension   . Stroke [facility])    april 2018    Past Surgical History:  Procedure Laterality Date  . ABDOMINAL HYSTERECTOMY    . BALLOON DILATION  12/31/2011   Procedure: BALLOON DILATION;  Surgeon: Missy Sabins, MD;  Location: [facility] ENDOSCOPY;  Service: Endoscopy;  Laterality: N/A;  . CHOLECYSTECTOMY  07/28/11  . ESOPHAGOGASTRODUODENOSCOPY  10/17/2011   Procedure: ESOPHAGOGASTRODUODENOSCOPY (EGD);  Surgeon: Missy Sabins, MD;  Location: [facility] ENDOSCOPY;  Service: Endoscopy;  Laterality: N/A;  . IR ANGIO INTRA EXTRACRAN SEL INTERNAL CAROTID BILAT MOD SED  04/07/2017  . IR ANGIO INTRA EXTRACRAN SEL INTERNAL CAROTID BILAT MOD SED  01/07/2018  . IR ANGIO VERTEBRAL SEL VERTEBRAL UNI R MOD SED  04/07/2017  . IR ANGIO VERTEBRAL SEL VERTEBRAL UNI R MOD SED  01/07/2018  . IR ANGIOGRAM FOLLOW UP STUDY  04/07/2017  . IR ANGIOGRAM FOLLOW UP STUDY  04/07/2017  . IR ANGIOGRAM FOLLOW UP STUDY  04/07/2017  . IR ANGIOGRAM FOLLOW UP STUDY  04/07/2017  . IR ANGIOGRAM FOLLOW UP STUDY  04/07/2017  . IR ANGIOGRAM SELECTIVE EACH ADDITIONAL VESSEL  04/07/2017  . IR TRANSCATH/EMBOLIZ   04/07/2017  . IR US GUIDE VASC ACCESS RIGHT  01/07/2018  . PARS PLANA VITRECTOMY Right 05/01/2017   Procedure: PARS PLANA VITRECTOMY WITH 25 GAUGE RIGHT EYE, endolaser photocoaglation;  Surgeon: Jalene Mullet, MD;  Location: [facility];  Service: Ophthalmology;  Laterality: Right;  . RADIOLOGY WITH ANESTHESIA N/A 04/07/2017   Procedure: RADIOLOGY WITH ANESTHESIA;  Surgeon: Consuella Lose, MD;  Location: [facility];  Service: Radiology;  Laterality: N/A;  . REPLACEMENT TOTAL KNEE BILATERAL  2004  . TEMPOROMANDIBULAR JOINT SURGERY     2 surgeries  . TUMOR REMOVAL    . VAGOTOMY  01/23/2012   Procedure: VAGOTOMY, antrectomy and BII;  Surgeon: Haywood Lasso, MD;  Location: [facility];  Service: General;  Laterality: N/A;  Laparotomy with vagotomy.    There were no vitals filed for this visit.  Subjective Assessment - 04/03/18 2048    Subjective  Decreased pain at rest in R hip today.  Did not do tall kneeling stretches yesterday but did walk on treadmill today.  Husband not letting patient use RW to ambulate to treatment area.    Patient is accompained by:  Family member    Limitations  Standing;Walking    Patient Stated Goals  To walk safely without walker, improve balance so no more falls.    Currently in Pain?  Yes  Pain Location  Groin    Pain Orientation  Right    Pain Descriptors / Indicators  Sore;Sharp    Pain Onset  More than a month ago                       [facility] Adult PT Treatment/Exercise - 04/03/18 2051      Ambulation/Gait   Ambulation/Gait  Yes    Ambulation/Gait Assistance  4: Min assist;3: Mod assist    Ambulation/Gait Assistance Details  Without functional e-stim today but with continued focus on carryover of increased L lateral weight shift, increased stance time on LLE and upright trunk posture (when demonstrating to pt how to tilt pelvis more posterior with hip extension for more upright trunk instead of increased lumbar extension pt reported 9/10 R hip  pain).  Therapist provided facilitation at pelvis for stabilization and proprioception with pt demonstrating improved step/stride length, LLE flexion to initiate swing phase and continuation of stepping with changes in direction; pt also able to maintain normal width of BOS    Ambulation Distance (Feet)  300 Feet    Assistive device  None    Ambulation Surface  Level;Indoor      Modalities   Modalities  Moist Heat      Moist Heat Therapy   Number Minutes Moist Heat  30 Minutes    Moist Heat Location  Hip anterior and posterior      Manual Therapy   Manual Therapy  Joint mobilization    Manual therapy comments  Initiated manual therapy in supine performing mobilization with movement to R hip focusing on inferior glide of femur with superior roll (use of belt and therapist manual facilitation for inferior glide) while performing hip flexion.  Pt only able to tolerate hip flexion to 80 deg; any hip movement into flexion >90, hip ER/IR, ABD or ADD significantly increased patient's pain.  Pt also reported 10/10 pain with palpation of iliopsoas muscle and tenderness down anterior, lateral thigh.  Pt also demonstrated inability to fully extend RLE and has to keep RLE in slight flexion for pain relief.  Pt transitioned to prone to stretch hip flexor - pt again reported 10/10 pain with pressure to posterior pelvis and with active/passive R knee flexion in prone.    Joint Mobilization  anterior > posterior grade 1-2 mobilizations with hip in 80 deg flexion during mobilization with movement as above.  Pt reported improvement in pain with anterior > posterior mobilization but no increase in ROM and pain level remained the same when mobilization ceased.  Also, during mobilization with movement pt reported feeling a pull/tension in R lumbar spine.             PT Education - 04/03/18 2107    Education provided  Yes    Education Details  gait training without AD    Person(s) Educated  Patient    Methods   Tactile cues;Verbal cues    Comprehension  Need further instruction       PT Short Term Goals - 03/27/18 1955      PT SHORT TERM GOAL #1   Title  Pt will improve DGI to >/= 17/24 without AD    Time  4    Period  Weeks    Status  On-going    Target Date  04/16/18      PT SHORT TERM GOAL #2   Title  Pt will decrease falls risk with community gait as indicated by  increase in gait velocity without device except Bioness to > or = 2.6 ft/sec    Time  4    Period  Weeks    Status  Revised    Target Date  04/16/18      PT SHORT TERM GOAL #3   Title  Pt will decrease falls risk as indicated by increase in BERG balance score to > or = 49/56    Time  4    Period  Weeks    Status  Revised    Target Date  04/16/18      PT SHORT TERM GOAL #4   Title  Pt will ambulate >300' on indoor surfaces without AD with Bioness with supervision    Time  4    Period  Weeks    Status  Revised    Target Date  04/16/18        PT Long Term Goals - 03/27/18 1955      PT LONG TERM GOAL #1   Title  Pt and husband will demonstrate independence with HEP, including use of treadmill    Baseline  03/12/18: met with current program issued to date- corner balance, treadmill routine    Time  8    Period  Weeks    Status  On-going    Target Date  05/16/18      PT LONG TERM GOAL #2   Title  Pt will demonstrate improved balance and decreased falls risk as indicated by BERG balance score of > or = 52/56    Baseline  03/12/18: 46/56 - no change from last assessment    Time  8    Period  Weeks    Status  On-going    Target Date  05/16/18      PT LONG TERM GOAL #3   Title  Pt will ambulate without AD x 350' over outdoor paved surfaces with supervision; will negotiate 4 stairs with one rail with alternating sequence and supervision    Baseline  03/12/18: can negotiate steps with single rail reciprocally with supervision, continues to vary in assistance needed with gait without AD on level indoor surfaces- min to  mod at times    Time  8    Period  Weeks    Status  Revised    Target Date  05/16/18      PT LONG TERM GOAL #4   Title  Pt will report improvement in Neuro QOL-LE to >/= 40%    Baseline  03/17/2018 Neuro QOL 34.3    Time  8    Period  Weeks    Status  On-going    Target Date  05/16/18      PT LONG TERM GOAL #5   Title  Pt will decrease falls risk during gait in home/community as indicated by increase in gait velocity to > or = 3.0 ft/sec    Baseline  03/14/18: 2.51 ft/sec with RW, increased from 2.4 ft/sec at last assessment    Time  8    Status  On-going    Target Date  05/16/18      PT LONG TERM GOAL #6   Title  Pt will demonstrate decreased falls risk with gait as indicated by increase in DGI score to >/= 19/24 without AD    Baseline  03/14/18: 14/24 today, decreased by 1 point from last assessment.     Time  8    Period  Weeks    Status  On-going  Target Date  05/16/18            Plan - 04/03/18 2108    Clinical Impression Statement  Treatment session focused today on further assessment of R hip joint ROM limitations, pain patterns and assessment of soft tissue.  Also performed R hip joint mobilization with movement to assess effect on joint ROM and pain.  Pt did not experience an increase in hip ROM in any plane of movement; pain improved during mobilization but no change in pain during PROM or palpation.  Continued gait training without AD but without functional estim to assess carryover of previous training.  Pt demonstrated improved stability and gait sequencing overall today.  Will continue to assess and progress towards LTG.    Rehab Potential  Good    PT Frequency  2x / week    PT Duration  8 weeks    PT Treatment/Interventions  ADLs/Self Care Home Management;Aquatic Therapy;DME Instruction;Gait training;Stair training;Functional mobility training;Therapeutic activities;Therapeutic exercise;Balance training;Neuromuscular re-education;Patient/family  education;Vestibular;Visual/perceptual remediation/compensation;Cognitive remediation;Orthotic Fit/Training;Electrical Stimulation    PT Next Visit Plan  Home Bioness unit?  Check BP. continue with use of Bioness to left LE: gait on treamill both single and double LE's, LLE on treadmill moving belt but without belt on; tall and half kneeling, use of theraband or sports cord to force weight shift to L, weight shifting on rockerboard.  Corner balance and finding midline    Consulted and Agree with Plan of Care  Patient;Family member/caregiver    Family Member Consulted  husband       Patient will benefit from skilled therapeutic intervention in order to improve the following deficits and impairments:  Abnormal gait, Decreased balance, Decreased cognition, Decreased coordination, Decreased strength, Difficulty walking, Dizziness, Impaired sensation, Impaired vision/preception, Pain, Decreased activity tolerance, Decreased mobility, Decreased endurance, Postural dysfunction  Visit Diagnosis: Ataxic gait  Unsteadiness on feet  Dizziness and giddiness  Other abnormalities of gait and mobility  Other lack of coordination     Problem List Patient Active Problem List   Diagnosis Date Noted  . 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 Weakness with dizziness-  since [facility] and CVA 04/07/17 08/07/2017  . Disturbances of vision, late effect of stroke 08/06/2017  . Health education/counseling 08/05/2017  . High risk medications (not anticoagulants) long-term use 08/05/2017  . Neuritis-  R sided:  arm and leg/ body due to stroke 08/05/2017  . Elevated LDL cholesterol level 07/11/2017  . [employer] [facility] [employer] (NIH) Stroke Scale limb ataxia score 2, ataxia present in two limbs 06/25/2017  . 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 Elevated vitamin B12 level 05/30/2017  . Vitamin D deficiency 05/29/2017  . History of tobacco abuse-  30pk yr hx - quit 04/07/17 05/21/2017  . Gait  disturbance, post-stroke 05/14/2017  . Benign essential HTN   . Vitreous hemorrhage of right eye ([place])   . Adjustment disorder with mixed anxiety and depressed mood   . 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 s/p SAH (subarachnoid hemorrhage) ([place]) 04/19/2017  . 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 Hypoxia   . Subarachnoid hemorrhage due to ruptured aneurysm ([place]) 04/07/2017  . CVA (cerebral vascular accident) ([place]) 04/07/2017  . Elevated gastrin level 01/25/2012  . Hypokalemia 01/21/2012  . Nausea & vomiting 01/20/2012  . Epigastric pain 01/20/2012  . Duodenal ulcer, acute with obstruction 10/17/2011  . S/P laparoscopic cholecystectomy 10/16/2011  Rico Junker, PT, DPT 04/03/18    9:21 PM    [facility] [address] [place], [place], [address] Phone: [phone]   Fax:  [phone]  Name: ARBOR LEER MRN: [id number] Date of Birth: 1976-06-13

## 2018-04-08 ENCOUNTER — Encounter: Payer: Self-pay | Admitting: Rehabilitation

## 2018-04-08 ENCOUNTER — Ambulatory Visit: Payer: BLUE CROSS/BLUE SHIELD | Admitting: Rehabilitation

## 2018-04-08 DIAGNOSIS — R260 Ataxic gait: Secondary | ICD-10-CM | POA: Diagnosis not present

## 2018-04-08 DIAGNOSIS — R2681 Unsteadiness on feet: Secondary | ICD-10-CM

## 2018-04-08 DIAGNOSIS — R278 Other lack of coordination: Secondary | ICD-10-CM

## 2018-04-08 DIAGNOSIS — R2689 Other abnormalities of gait and mobility: Secondary | ICD-10-CM

## 2018-04-08 NOTE — Therapy (Signed)
[facility] [address] [place], [place], [address] Phone: [phone]   Fax:  [phone]  Physical Therapy Treatment  Patient Details  Name: [name] Barnett MRN: [id number] Date of Birth: 1976/07/08 Referring Provider: Charlett Blake, MD   Encounter Date: 04/08/2018  PT End of Session - 04/08/18 1946    Visit Number  43    Number of Visits  63    Date for PT Re-Evaluation  05/16/18 date corrected to reflect 79 day cert    Authorization Type  BCBS    PT Start Time  1533    PT Stop Time  1617    PT Time Calculation (min)  44 min    Activity Tolerance  Patient tolerated treatment well    Behavior During Therapy  [facility] for tasks assessed/performed       Past Medical History:  Diagnosis Date  . Duodenal obstruction   . GERD (gastroesophageal reflux disease)   . Hypertension   . Stroke [facility])    april 2018    Past Surgical History:  Procedure Laterality Date  . ABDOMINAL HYSTERECTOMY    . BALLOON DILATION  12/31/2011   Procedure: BALLOON DILATION;  Surgeon: Missy Sabins, MD;  Location: [facility] ENDOSCOPY;  Service: Endoscopy;  Laterality: N/A;  . CHOLECYSTECTOMY  07/28/11  . ESOPHAGOGASTRODUODENOSCOPY  10/17/2011   Procedure: ESOPHAGOGASTRODUODENOSCOPY (EGD);  Surgeon: Missy Sabins, MD;  Location: [facility] ENDOSCOPY;  Service: Endoscopy;  Laterality: N/A;  . IR ANGIO INTRA EXTRACRAN SEL INTERNAL CAROTID BILAT MOD SED  04/07/2017  . IR ANGIO INTRA EXTRACRAN SEL INTERNAL CAROTID BILAT MOD SED  01/07/2018  . IR ANGIO VERTEBRAL SEL VERTEBRAL UNI R MOD SED  04/07/2017  . IR ANGIO VERTEBRAL SEL VERTEBRAL UNI R MOD SED  01/07/2018  . IR ANGIOGRAM FOLLOW UP STUDY  04/07/2017  . IR ANGIOGRAM FOLLOW UP STUDY  04/07/2017  . IR ANGIOGRAM FOLLOW UP STUDY  04/07/2017  . IR ANGIOGRAM FOLLOW UP STUDY  04/07/2017  . IR ANGIOGRAM FOLLOW UP STUDY  04/07/2017  . IR ANGIOGRAM SELECTIVE EACH ADDITIONAL VESSEL  04/07/2017  . IR TRANSCATH/EMBOLIZ   04/07/2017  . IR US GUIDE VASC ACCESS RIGHT  01/07/2018  . PARS PLANA VITRECTOMY Right 05/01/2017   Procedure: PARS PLANA VITRECTOMY WITH 25 GAUGE RIGHT EYE, endolaser photocoaglation;  Surgeon: Jalene Mullet, MD;  Location: [place];  Service: Ophthalmology;  Laterality: Right;  . RADIOLOGY WITH ANESTHESIA N/A 04/07/2017   Procedure: RADIOLOGY WITH ANESTHESIA;  Surgeon: Consuella Lose, MD;  Location: [place];  Service: Radiology;  Laterality: N/A;  . REPLACEMENT TOTAL KNEE BILATERAL  2004  . TEMPOROMANDIBULAR JOINT SURGERY     2 surgeries  . TUMOR REMOVAL    . VAGOTOMY  01/23/2012   Procedure: VAGOTOMY, antrectomy and BII;  Surgeon: Haywood Lasso, MD;  Location: [place];  Service: General;  Laterality: N/A;  Laparotomy with vagotomy.    There were no vitals filed for this visit.  Subjective Assessment - 04/08/18 1537    Subjective  Reports she went out to eat for the first time in a year.  Went well and feels that she will do again, she just doesn't want to use walker in public.     Patient is accompained by:  Family member    Limitations  Standing;Walking    Patient Stated Goals  To walk safely without walker, improve balance so no more falls.    Currently in Pain?  Yes  Pain Score  4     Pain Location  Leg    Pain Orientation  Right    Pain Descriptors / Indicators  Sore;Sharp    Pain Type  Chronic pain    Pain Onset  More than a month ago    Pain Frequency  Constant    Aggravating Factors   walking, stretching    Pain Relieving Factors  rest                        [facility] Adult PT Treatment/Exercise - 04/08/18 1538      Ambulation/Gait   Ambulation/Gait  Yes    Ambulation/Gait Assistance  4: Min guard    Ambulation/Gait Assistance Details  without RW during session from mat to OT table and back.  Cues for increased sped for improved coordination.      Ambulation Distance (Feet)  50 Feet    Assistive device  None    Gait Pattern  Step-through  pattern;Decreased stance time - left;Decreased weight shift to left;Wide base of support;Abducted - left    Ambulation Surface  Level;Indoor      Self-Care   Self-Care  Other Self-Care Comments    Other Self-Care Comments   Continue to work to assess R hip pain.  See TA section for details.  Continue to recommend that pt follow up with MD regarding next steps as xray (per report) seems clear (PT did not discuss this with pt).  Pt and spouse verbalized understanding.        Therapeutic Activites    Therapeutic Activities  Other Therapeutic Activities    ADL's  PT and other PT Jamey Reas) continue to assess hip pain duirng session.  Note that with deep pressure to groin she has improved pain, however has increased pain with pressure more laterally at groin.  Assessed for scar tissue that may have formed from previous vascular procedure, however was unable to fully tell if this was cause of pain vs muscular pain.  Pt with extreme tenderness to even superficial soft tissue stretch to area.  Note L low back pain with full R LE extension (off edge of mat).  Pt able to better tolerate hip flex stretch with contract relax technique into hip flex stretch, however did not feel that she could do this herself at home.  Will continue to address/assess as able.        Neuro Re-ed    Neuro Re-ed Details   Performed NMR in tall kneeling/half kneeling for improved proprioceptive feedback through forced use, WB and proximal stabilization.  Performed squats in tall kneeling without use of UE support x 10 reps with cues for slow controlld movement and improved hip protraction.  transitioned to L half kneeling moving laterally over LLE to increase R hip flex stretch.  Performed x 5 reps (2 sets) with cues for decreased UE support.  R half kneeling working to decrease UE support maintaining balance progressing to maintaining balance and WB on LLE with R heel lifts.  Performed x 2 sets of 10 reps.  Pt did well with  increased practice and cues.  Moved into quadruped position again for proximal stabilization alternating LEs x 10 reps progressing to alternating UE/LE x 10 reps with mod fading to min A when having to stabilize on LLE.        Knee/Hip Exercises: Stretches   Gastroc Stretch  Left;1 rep;30 seconds standing at table for UE support.  PT Education - 04/08/18 1945    Education provided  Yes    Education Details  continuing to assess hip pain    Person(s) Educated  Patient    Methods  Explanation    Comprehension  Verbalized understanding       PT Short Term Goals - 03/27/18 1955      PT SHORT TERM GOAL #1   Title  Pt will improve DGI to >/= 17/24 without AD    Time  4    Period  Weeks    Status  On-going    Target Date  04/16/18      PT SHORT TERM GOAL #2   Title  Pt will decrease falls risk with community gait as indicated by increase in gait velocity without device except Bioness to > or = 2.6 ft/sec    Time  4    Period  Weeks    Status  Revised    Target Date  04/16/18      PT SHORT TERM GOAL #3   Title  Pt will decrease falls risk as indicated by increase in BERG balance score to > or = 49/56    Time  4    Period  Weeks    Status  Revised    Target Date  04/16/18      PT SHORT TERM GOAL #4   Title  Pt will ambulate >300' on indoor surfaces without AD with Bioness with supervision    Time  4    Period  Weeks    Status  Revised    Target Date  04/16/18        PT Long Term Goals - 03/27/18 1955      PT LONG TERM GOAL #1   Title  Pt and husband will demonstrate independence with HEP, including use of treadmill    Baseline  03/12/18: met with current program issued to date- corner balance, treadmill routine    Time  8    Period  Weeks    Status  On-going    Target Date  05/16/18      PT LONG TERM GOAL #2   Title  Pt will demonstrate improved balance and decreased falls risk as indicated by BERG balance score of > or = 52/56    Baseline  03/12/18:  46/56 - no change from last assessment    Time  8    Period  Weeks    Status  On-going    Target Date  05/16/18      PT LONG TERM GOAL #3   Title  Pt will ambulate without AD x 350' over outdoor paved surfaces with supervision; will negotiate 4 stairs with one rail with alternating sequence and supervision    Baseline  03/12/18: can negotiate steps with single rail reciprocally with supervision, continues to vary in assistance needed with gait without AD on level indoor surfaces- min to mod at times    Time  8    Period  Weeks    Status  Revised    Target Date  05/16/18      PT LONG TERM GOAL #4   Title  Pt will report improvement in Neuro QOL-LE to >/= 40%    Baseline  03/17/2018 Neuro QOL 34.3    Time  8    Period  Weeks    Status  On-going    Target Date  05/16/18      PT LONG TERM GOAL #5  Title  Pt will decrease falls risk during gait in home/community as indicated by increase in gait velocity to > or = 3.0 ft/sec    Baseline  03/14/18: 2.51 ft/sec with RW, increased from 2.4 ft/sec at last assessment    Time  8    Status  On-going    Target Date  05/16/18      PT LONG TERM GOAL #6   Title  Pt will demonstrate decreased falls risk with gait as indicated by increase in DGI score to >/= 19/24 without AD    Baseline  03/14/18: 14/24 today, decreased by 1 point from last assessment.     Time  8    Period  Weeks    Status  On-going    Target Date  05/16/18            Plan - 04/08/18 1946    Clinical Impression Statement  Skilled session continues to focus on LLE NMR in tall kneeling, half kneeling, and quadruped for forced use, WB and proximal stabilization to decrease ataxia during gait.  Also continue to address R hip pain, which continues to be impeding pts progress.      Rehab Potential  Good    PT Frequency  2x / week    PT Duration  8 weeks    PT Treatment/Interventions  ADLs/Self Care Home Management;Aquatic Therapy;DME Instruction;Gait training;Stair  training;Functional mobility training;Therapeutic activities;Therapeutic exercise;Balance training;Neuromuscular re-education;Patient/family education;Vestibular;Visual/perceptual remediation/compensation;Cognitive remediation;Orthotic Fit/Training;Electrical Stimulation    PT Next Visit Plan  Home Bioness unit?  Check BP. continue with use of Bioness to left LE: gait on treamill both single and double LE's, LLE on treadmill moving belt but without belt on; tall and half kneeling, use of theraband or sports cord to force weight shift to L, weight shifting on rockerboard.  Corner balance and finding midline    Consulted and Agree with Plan of Care  Patient;Family member/caregiver    Family Member Consulted  husband       Patient will benefit from skilled therapeutic intervention in order to improve the following deficits and impairments:  Abnormal gait, Decreased balance, Decreased cognition, Decreased coordination, Decreased strength, Difficulty walking, Dizziness, Impaired sensation, Impaired vision/preception, Pain, Decreased activity tolerance, Decreased mobility, Decreased endurance, Postural dysfunction  Visit Diagnosis: Ataxic gait  Unsteadiness on feet  Other abnormalities of gait and mobility  Other lack of coordination     Problem List Patient Active Problem List   Diagnosis Date Noted  . 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 Weakness with dizziness-  since [facility] and CVA 04/07/17 08/07/2017  . Disturbances of vision, late effect of stroke 08/06/2017  . Health education/counseling 08/05/2017  . High risk medications (not anticoagulants) long-term use 08/05/2017  . Neuritis-  R sided:  arm and leg/ body due to stroke 08/05/2017  . Elevated LDL cholesterol level 07/11/2017  . [employer] (NIH) Stroke Scale limb ataxia score 2, ataxia present in two limbs 06/25/2017  . 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 Elevated vitamin B12 level 05/30/2017   . Vitamin D deficiency 05/29/2017  . History of tobacco abuse-  30pk yr hx - quit 04/07/17 05/21/2017  . Gait disturbance, post-stroke 05/14/2017  . Benign essential HTN   . Vitreous hemorrhage of right eye ([place])   . Adjustment disorder with mixed anxiety and depressed mood   . 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 s/p SAH (  subarachnoid hemorrhage) ([place]) 04/19/2017  . 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 Hypoxia   . Subarachnoid hemorrhage due to ruptured aneurysm ([place]) 04/07/2017  . CVA (cerebral vascular accident) ([place]) 04/07/2017  . Elevated gastrin level 01/25/2012  . Hypokalemia 01/21/2012  . Nausea & vomiting 01/20/2012  . Epigastric pain 01/20/2012  . Duodenal ulcer, acute with obstruction 10/17/2011  . S/P laparoscopic cholecystectomy 10/16/2011    Cameron Sprang, PT, MPT [facility] [address] [place], [place], [address] Phone: [phone]   Fax:  [phone] 04/08/18, 7:50 PM  Name: [name] Barnett MRN: [id number] Date of Birth: 25-Oct-1976

## 2018-04-10 ENCOUNTER — Ambulatory Visit: Payer: BLUE CROSS/BLUE SHIELD | Attending: Physical Medicine & Rehabilitation | Admitting: Physical Therapy

## 2018-04-10 ENCOUNTER — Encounter: Payer: Self-pay | Admitting: Physical Therapy

## 2018-04-10 DIAGNOSIS — R2689 Other abnormalities of gait and mobility: Secondary | ICD-10-CM | POA: Diagnosis present

## 2018-04-10 DIAGNOSIS — R278 Other lack of coordination: Secondary | ICD-10-CM | POA: Diagnosis present

## 2018-04-10 DIAGNOSIS — R42 Dizziness and giddiness: Secondary | ICD-10-CM | POA: Diagnosis present

## 2018-04-10 DIAGNOSIS — R260 Ataxic gait: Secondary | ICD-10-CM | POA: Insufficient documentation

## 2018-04-10 DIAGNOSIS — I69054 Hemiplegia and hemiparesis following nontraumatic subarachnoid hemorrhage affecting left non-dominant side: Secondary | ICD-10-CM | POA: Diagnosis present

## 2018-04-10 DIAGNOSIS — R2681 Unsteadiness on feet: Secondary | ICD-10-CM | POA: Diagnosis present

## 2018-04-10 NOTE — Therapy (Signed)
[facility] [address] [place], [place], [address] Phone: [phone]   Fax:  [phone]  Physical Therapy Treatment  Patient Details  Name: [name] Barnett MRN: [id number] Date of Birth: 1976-10-05 Referring Provider: Charlett Blake, MD   Encounter Date: 04/10/2018  PT End of Session - 04/10/18 2213    Visit Number  61    Number of Visits  26    Date for PT Re-Evaluation  05/16/18 date corrected to reflect 66 day cert    Authorization Type  BCBS    PT Start Time  3235    PT Stop Time  1615    PT Time Calculation (min)  44 min    Activity Tolerance  Patient tolerated treatment well    Behavior During Therapy  [facility] for tasks assessed/performed       Past Medical History:  Diagnosis Date  . Duodenal obstruction   . GERD (gastroesophageal reflux disease)   . Hypertension   . Stroke [facility])    april 2018    Past Surgical History:  Procedure Laterality Date  . ABDOMINAL HYSTERECTOMY    . BALLOON DILATION  12/31/2011   Procedure: BALLOON DILATION;  Surgeon: Missy Sabins, MD;  Location: [facility] ENDOSCOPY;  Service: Endoscopy;  Laterality: N/A;  . CHOLECYSTECTOMY  07/28/11  . ESOPHAGOGASTRODUODENOSCOPY  10/17/2011   Procedure: ESOPHAGOGASTRODUODENOSCOPY (EGD);  Surgeon: Missy Sabins, MD;  Location: [facility] ENDOSCOPY;  Service: Endoscopy;  Laterality: N/A;  . IR ANGIO INTRA EXTRACRAN SEL INTERNAL CAROTID BILAT MOD SED  04/07/2017  . IR ANGIO INTRA EXTRACRAN SEL INTERNAL CAROTID BILAT MOD SED  01/07/2018  . IR ANGIO VERTEBRAL SEL VERTEBRAL UNI R MOD SED  04/07/2017  . IR ANGIO VERTEBRAL SEL VERTEBRAL UNI R MOD SED  01/07/2018  . IR ANGIOGRAM FOLLOW UP STUDY  04/07/2017  . IR ANGIOGRAM FOLLOW UP STUDY  04/07/2017  . IR ANGIOGRAM FOLLOW UP STUDY  04/07/2017  . IR ANGIOGRAM FOLLOW UP STUDY  04/07/2017  . IR ANGIOGRAM FOLLOW UP STUDY  04/07/2017  . IR ANGIOGRAM SELECTIVE EACH ADDITIONAL VESSEL  04/07/2017  . IR TRANSCATH/EMBOLIZ   04/07/2017  . IR US GUIDE VASC ACCESS RIGHT  01/07/2018  . PARS PLANA VITRECTOMY Right 05/01/2017   Procedure: PARS PLANA VITRECTOMY WITH 25 GAUGE RIGHT EYE, endolaser photocoaglation;  Surgeon: Jalene Mullet, MD;  Location: [place];  Service: Ophthalmology;  Laterality: Right;  . RADIOLOGY WITH ANESTHESIA N/A 04/07/2017   Procedure: RADIOLOGY WITH ANESTHESIA;  Surgeon: Consuella Lose, MD;  Location: [place];  Service: Radiology;  Laterality: N/A;  . REPLACEMENT TOTAL KNEE BILATERAL  2004  . TEMPOROMANDIBULAR JOINT SURGERY     2 surgeries  . TUMOR REMOVAL    . VAGOTOMY  01/23/2012   Procedure: VAGOTOMY, antrectomy and BII;  Surgeon: Haywood Lasso, MD;  Location: [place];  Service: General;  Laterality: N/A;  Laparotomy with vagotomy.    There were no vitals filed for this visit.  Subjective Assessment - 04/10/18 1530    Subjective  No falls. Her hip is still hurting a lot especially with sleep. She cannot get comfortable. She places a pillow under right thigh & knee in supine. She attempts to lay on left side but only tolerates short period. She is unable to lay on right side.     Patient is accompained by:  Family member    Limitations  Standing;Walking    Patient Stated Goals  To walk safely without walker, improve balance  so no more falls.    Currently in Pain?  Yes    Pain Score  5  in last week, worst 10/10, best 5/10    Pain Location  Hip    Pain Orientation  Right;Posterior    Pain Radiating Towards  radates to thigh & to knee anterioriy     Pain Onset  More than a month ago    Pain Frequency  Constant    Aggravating Factors   Laying down especially straightening leg, worst in mornings    Pain Relieving Factors  walking but not overdoing it, moving it when sitting    Effect of Pain on Daily Activities  limts daily activities    Multiple Pain Sites  No      Gait Training with RW: Tactile & verbal cues on upright posture, position within RW to facilitate upright posture and  knee flexion in swing.  Pt ambulated 100' X 2 with above cues.  Neuromuscular Reeducation: Single knee to chest stretches: Pt tolerated left knee to chest. Right knee to chest with PT supporting weight of leg - only tolerates 90*. PT supporting hip at 90* supported internal rotation to ~30* with 15-30sec hold. Bridging 10 reps small range Heel slide RLE with contract relax hip flexors: pt only tolerated hip to -45* extension Standing with RW support, LLE on 6" block and PT manual cues: RLE mini squat 15 reps & trunk rotation to look over shoulders (>pain rotation to right than left) Seated at edge of mat with feet supported on floor: posterior lean with abdominal muscle contraction to semi-recline position. In semi-recline position: manual soft tissue stretch to lower abdomen and upper quadriceps.                           PT Short Term Goals - 03/27/18 1955      PT SHORT TERM GOAL #1   Title  Pt will improve DGI to >/= 17/24 without AD    Time  4    Period  Weeks    Status  On-going    Target Date  04/16/18      PT SHORT TERM GOAL #2   Title  Pt will decrease falls risk with community gait as indicated by increase in gait velocity without device except Bioness to > or = 2.6 ft/sec    Time  4    Period  Weeks    Status  Revised    Target Date  04/16/18      PT SHORT TERM GOAL #3   Title  Pt will decrease falls risk as indicated by increase in BERG balance score to > or = 49/56    Time  4    Period  Weeks    Status  Revised    Target Date  04/16/18      PT SHORT TERM GOAL #4   Title  Pt will ambulate >300' on indoor surfaces without AD with Bioness with supervision    Time  4    Period  Weeks    Status  Revised    Target Date  04/16/18        PT Long Term Goals - 03/27/18 1955      PT LONG TERM GOAL #1   Title  Pt and husband will demonstrate independence with HEP, including use of treadmill    Baseline  03/12/18: met with current program issued to  date- corner balance, treadmill routine  Time  8    Period  Weeks    Status  On-going    Target Date  05/16/18      PT LONG TERM GOAL #2   Title  Pt will demonstrate improved balance and decreased falls risk as indicated by BERG balance score of > or = 52/56    Baseline  03/12/18: 46/56 - no change from last assessment    Time  8    Period  Weeks    Status  On-going    Target Date  05/16/18      PT LONG TERM GOAL #3   Title  Pt will ambulate without AD x 350' over outdoor paved surfaces with supervision; will negotiate 4 stairs with one rail with alternating sequence and supervision    Baseline  03/12/18: can negotiate steps with single rail reciprocally with supervision, continues to vary in assistance needed with gait without AD on level indoor surfaces- min to mod at times    Time  8    Period  Weeks    Status  Revised    Target Date  05/16/18      PT LONG TERM GOAL #4   Title  Pt will report improvement in Neuro QOL-LE to >/= 40%    Baseline  03/17/2018 Neuro QOL 34.3    Time  8    Period  Weeks    Status  On-going    Target Date  05/16/18      PT LONG TERM GOAL #5   Title  Pt will decrease falls risk during gait in home/community as indicated by increase in gait velocity to > or = 3.0 ft/sec    Baseline  03/14/18: 2.51 ft/sec with RW, increased from 2.4 ft/sec at last assessment    Time  8    Status  On-going    Target Date  05/16/18      PT LONG TERM GOAL #6   Title  Pt will demonstrate decreased falls risk with gait as indicated by increase in DGI score to >/= 19/24 without AD    Baseline  03/14/18: 14/24 today, decreased by 1 point from last assessment.     Time  8    Period  Weeks    Status  On-going    Target Date  05/16/18              Patient will benefit from skilled therapeutic intervention in order to improve the following deficits and impairments:     Visit Diagnosis: Ataxic gait  Unsteadiness on feet  Other abnormalities of gait and  mobility  Other lack of coordination     Problem List Patient Active Problem List   Diagnosis Date Noted  . 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 Weakness with dizziness-  since [facility] and CVA 04/07/17 08/07/2017  . Disturbances of vision, late effect of stroke 08/06/2017  . Health education/counseling 08/05/2017  . High risk medications (not anticoagulants) long-term use 08/05/2017  . Neuritis-  R sided:  arm and leg/ body due to stroke 08/05/2017  . Elevated LDL cholesterol level 07/11/2017  . [employer] (NIH) Stroke Scale limb ataxia score 2, ataxia present in two limbs 06/25/2017  . 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 Elevated vitamin B12 level 05/30/2017  . Vitamin D deficiency 05/29/2017  . History of tobacco abuse-  30pk yr hx - quit 04/07/17 05/21/2017  . Gait disturbance, post-stroke 05/14/2017  . Benign essential HTN   .  Vitreous hemorrhage of right eye ([place])   . Adjustment disorder with mixed anxiety and depressed mood   . 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 s/p SAH (subarachnoid hemorrhage) ([place]) 04/19/2017  . 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 Hypoxia   . Subarachnoid hemorrhage due to ruptured aneurysm ([place]) 04/07/2017  . CVA (cerebral vascular accident) ([place]) 04/07/2017  . Elevated gastrin level 01/25/2012  . Hypokalemia 01/21/2012  . Nausea & vomiting 01/20/2012  . Epigastric pain 01/20/2012  . Duodenal ulcer, acute with obstruction 10/17/2011  . S/P laparoscopic cholecystectomy 10/16/2011    Jamey Reas PT, DPT 04/10/2018, 10:15 PM  [facility] [address] [place], [place], [address] Phone: [phone]   Fax:  [phone]  Name: [name] Barnett MRN: [id number] Date of Birth: [birth year]

## 2018-04-11 ENCOUNTER — Telehealth: Payer: Self-pay

## 2018-04-11 NOTE — Telephone Encounter (Signed)
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  Please schedule for Fluoro guided injection May call in tramadol  BID prn #30 no refill

## 2018-04-11 NOTE — Telephone Encounter (Signed)
Patient called requesting X-ray results from previous hip X-rays, also reporting that her pain in the hip area has sharply increased and is asking if there is anything that can be done for it because the tylenol is not helping.

## 2018-04-15 ENCOUNTER — Ambulatory Visit: Payer: BLUE CROSS/BLUE SHIELD | Admitting: Physical Therapy

## 2018-04-15 ENCOUNTER — Encounter: Payer: Self-pay | Admitting: Physical Therapy

## 2018-04-15 DIAGNOSIS — R2689 Other abnormalities of gait and mobility: Secondary | ICD-10-CM

## 2018-04-15 DIAGNOSIS — R260 Ataxic gait: Secondary | ICD-10-CM | POA: Diagnosis not present

## 2018-04-15 DIAGNOSIS — R2681 Unsteadiness on feet: Secondary | ICD-10-CM

## 2018-04-16 MED ORDER — TRAMADOL HCL 50 MG PO TABS: 50.0000 mg | ORAL_TABLET | Freq: Two times a day (BID) | ORAL | 0 refills | Status: DC

## 2018-04-16 NOTE — Telephone Encounter (Addendum)
Left detailed message with information on [name] Barnett's number (the number listed on DPR to leave messages). Tramadol called to pharmacy.  She has appt 04/21/18 with Kirsteins, she can discuss injection with him at that time.[name]

## 2018-04-16 NOTE — Therapy (Signed)
[facility] [address] [place], [place], [address] Phone: [phone]   Fax:  [phone]  Physical Therapy Treatment  Patient Details  Name: [name] Barnett MRN: [id number] Date of Birth: 1976-02-02 Referring Provider: Charlett Blake, MD   Encounter Date: 04/15/2018  PT End of Session - 04/15/18 1540    Visit Number  18    Number of Visits  62    Date for PT Re-Evaluation  05/16/18 date corrected to reflect 22 day cert    Authorization Type  BCBS    PT Start Time  1533    PT Stop Time  1615    PT Time Calculation (min)  42 min    Activity Tolerance  Patient tolerated treatment well    Behavior During Therapy  [facility] for tasks assessed/performed       Past Medical History:  Diagnosis Date  . Duodenal obstruction   . GERD (gastroesophageal reflux disease)   . Hypertension   . Stroke [facility])    april 2018    Past Surgical History:  Procedure Laterality Date  . ABDOMINAL HYSTERECTOMY    . BALLOON DILATION  12/31/2011   Procedure: BALLOON DILATION;  Surgeon: Missy Sabins, MD;  Location: [facility] ENDOSCOPY;  Service: Endoscopy;  Laterality: N/A;  . CHOLECYSTECTOMY  07/28/11  . ESOPHAGOGASTRODUODENOSCOPY  10/17/2011   Procedure: ESOPHAGOGASTRODUODENOSCOPY (EGD);  Surgeon: Missy Sabins, MD;  Location: [facility] ENDOSCOPY;  Service: Endoscopy;  Laterality: N/A;  . IR ANGIO INTRA EXTRACRAN SEL INTERNAL CAROTID BILAT MOD SED  04/07/2017  . IR ANGIO INTRA EXTRACRAN SEL INTERNAL CAROTID BILAT MOD SED  01/07/2018  . IR ANGIO VERTEBRAL SEL VERTEBRAL UNI R MOD SED  04/07/2017  . IR ANGIO VERTEBRAL SEL VERTEBRAL UNI R MOD SED  01/07/2018  . IR ANGIOGRAM FOLLOW UP STUDY  04/07/2017  . IR ANGIOGRAM FOLLOW UP STUDY  04/07/2017  . IR ANGIOGRAM FOLLOW UP STUDY  04/07/2017  . IR ANGIOGRAM FOLLOW UP STUDY  04/07/2017  . IR ANGIOGRAM FOLLOW UP STUDY  04/07/2017  . IR ANGIOGRAM SELECTIVE EACH ADDITIONAL VESSEL  04/07/2017  . IR TRANSCATH/EMBOLIZ   04/07/2017  . IR US GUIDE VASC ACCESS RIGHT  01/07/2018  . PARS PLANA VITRECTOMY Right 05/01/2017   Procedure: PARS PLANA VITRECTOMY WITH 25 GAUGE RIGHT EYE, endolaser photocoaglation;  Surgeon: Jalene Mullet, MD;  Location: [place];  Service: Ophthalmology;  Laterality: Right;  . RADIOLOGY WITH ANESTHESIA N/A 04/07/2017   Procedure: RADIOLOGY WITH ANESTHESIA;  Surgeon: Consuella Lose, MD;  Location: [place];  Service: Radiology;  Laterality: N/A;  . REPLACEMENT TOTAL KNEE BILATERAL  2004  . TEMPOROMANDIBULAR JOINT SURGERY     2 surgeries  . TUMOR REMOVAL    . VAGOTOMY  01/23/2012   Procedure: VAGOTOMY, antrectomy and BII;  Surgeon: Haywood Lasso, MD;  Location: [place];  Service: General;  Laterality: N/A;  Laparotomy with vagotomy.    There were no vitals filed for this visit.  Subjective Assessment - 04/15/18 1537    Subjective  Called [place] office on Friday to get results, no call back as yet. See's him Monday and plans to make him aware of how "shitty" that was. No falls. Hip continues to hurt, especially at night adn 1st thing in am. Also with prolonged sitting.     Patient is accompained by:  Family member    Limitations  Standing;Walking    Patient Stated Goals  To walk safely without walker, improve balance so no  more falls.    Currently in Pain?  Yes    Pain Score  4     Pain Location  Hip    Pain Orientation  Right;Posterior;Anterior    Pain Descriptors / Indicators  Aching;Sore;Sharp sharp in groin, other for lateral>posterior hip    Pain Type  Chronic pain    Pain Radiating Towards  radiates into thigh and anterior knee    Pain Onset  More than a month ago    Pain Frequency  Constant    Aggravating Factors   laying down to straighten knee    Pain Relieving Factors  moving it without overdoing it.         Treatment: Supine on mat: for right LE strengthening and decreased pain Single knee to chest for 30 seconds, passively x 2 reps Hamstring stretch for 30 seconds  x 2 passively Gentle distraction while holding at femur, grade 1-2 for 2-3 minutes. Decreased pain reported as reps progressed. Bridge for 10 reps with 5 sec holds. Assist needed to stabilize LE's with ex's.  Left side lying: Gentle grade 1-2 pelvic mobs in inferior/supererior directions for 1-2 minutes Attempted gentle hip distractions with increased groin pain reported so activity stopped. Piriformis release attempted, pt very tender to touch and only tolerated light pressure for up to 30 seconds. IT band stretching via foam roller for 3-4 minutes with light pressure progressing to moderate pressure. Pt and spouse educated on how to do this at home. Clam shell ex for 10 reps with assist needed to stabilize pelvis. Limited range with each rep as well.      [facility] PT Assessment - 04/15/18 1603      Berg Balance Test   Sit to Stand  Able to stand without using hands and stabilize independently    Standing Unsupported  Able to stand safely 2 minutes    Sitting with Back Unsupported but Feet Supported on Floor or Stool  Able to sit safely and securely 2 minutes    Stand to Sit  Sits safely with minimal use of hands    Transfers  Able to transfer safely, minor use of hands    Standing Unsupported with Eyes Closed  Able to stand 10 seconds safely    Standing Ubsupported with Feet Together  Able to place feet together independently and stand for 1 minute with supervision    From Standing, Reach Forward with Outstretched Arm  Can reach confidently >25 cm (10")    From Standing Position, Pick up Object from [place] to pick up shoe safely and easily    From Standing Position, Turn to Look Behind Over each Shoulder  Looks behind from both sides and weight shifts well    Turn 360 Degrees  Able to turn 360 degrees safely but slowly    Standing Unsupported, Alternately Place Feet on Step/Stool  Able to complete 4 steps without aid or supervision sup/MGA for 4 steps, MGA>min assist for remaining 4     Standing Unsupported, One Foot in Front  Able to plae foot ahead of the other independently and hold 30 seconds    Standing on One Leg  Able to lift leg independently and hold 5-10 seconds    Total Score  49             PT Short Term Goals - 04/16/18 2324      PT SHORT TERM GOAL #1   Title  Pt will improve DGI to >/= 17/24 without AD  Time  4    Period  Weeks    Status  On-going      PT SHORT TERM GOAL #2   Title  Pt will decrease falls risk with community gait as indicated by increase in gait velocity without device except Bioness to > or = 2.6 ft/sec    Time  4    Period  Weeks    Status  Revised      PT SHORT TERM GOAL #3   Title  Pt will decrease falls risk as indicated by increase in BERG balance score to > or = 49/56    Baseline  04/15/18: 49/56 scored today    Status  Achieved      PT SHORT TERM GOAL #4   Title  Pt will ambulate >300' on indoor surfaces without AD with Bioness with supervision    Time  4    Period  Weeks    Status  Revised        PT Long Term Goals - 03/27/18 1955      PT LONG TERM GOAL #1   Title  Pt and husband will demonstrate independence with HEP, including use of treadmill    Baseline  03/12/18: met with current program issued to date- corner balance, treadmill routine    Time  8    Period  Weeks    Status  On-going    Target Date  05/16/18      PT LONG TERM GOAL #2   Title  Pt will demonstrate improved balance and decreased falls risk as indicated by BERG balance score of > or = 52/56    Baseline  03/12/18: 46/56 - no change from last assessment    Time  8    Period  Weeks    Status  On-going    Target Date  05/16/18      PT LONG TERM GOAL #3   Title  Pt will ambulate without AD x 350' over outdoor paved surfaces with supervision; will negotiate 4 stairs with one rail with alternating sequence and supervision    Baseline  03/12/18: can negotiate steps with single rail reciprocally with supervision, continues to vary in assistance  needed with gait without AD on level indoor surfaces- min to mod at times    Time  8    Period  Weeks    Status  Revised    Target Date  05/16/18      PT LONG TERM GOAL #4   Title  Pt will report improvement in Neuro QOL-LE to >/= 40%    Baseline  03/17/2018 Neuro QOL 34.3    Time  8    Period  Weeks    Status  On-going    Target Date  05/16/18      PT LONG TERM GOAL #5   Title  Pt will decrease falls risk during gait in home/community as indicated by increase in gait velocity to > or = 3.0 ft/sec    Baseline  03/14/18: 2.51 ft/sec with RW, increased from 2.4 ft/sec at last assessment    Time  8    Status  On-going    Target Date  05/16/18      PT LONG TERM GOAL #6   Title  Pt will demonstrate decreased falls risk with gait as indicated by increase in DGI score to >/= 19/24 without AD    Baseline  03/14/18: 14/24 today, decreased by 1 point from last assessment.  Time  8    Period  Weeks    Status  On-going    Target Date  05/16/18            Plan - 04/15/18 1541    Clinical Impression Statement  Today's skilled session continued to address right hip pain via stretching, manual therapy and exercise. Pt presesnts with multi factorial issues at her hip and groin. Tender to palpation at piriformis, tight IT band and also with groin pain. When working on one area, other areas would hurt more. This will need to be monitored and continue to address as able. Also began to look at goals with pt improving her Berg Balance Test score.                                     Rehab Potential  Good    PT Frequency  2x / week    PT Duration  8 weeks    PT Treatment/Interventions  ADLs/Self Care Home Management;Aquatic Therapy;DME Instruction;Gait training;Stair training;Functional mobility training;Therapeutic activities;Therapeutic exercise;Balance training;Neuromuscular re-education;Patient/family education;Vestibular;Visual/perceptual remediation/compensation;Cognitive remediation;Orthotic  Fit/Training;Electrical Stimulation    PT Next Visit Plan  check remaining STGs. Home Bioness unit?  Check BP. continue with use of Bioness to left LE: gait on treamill both single and double LE's, LLE on treadmill moving belt but without belt on; tall and half kneeling, use of theraband or sports cord to force weight shift to L, weight shifting on rockerboard.  Corner balance and finding midline    Consulted and Agree with Plan of Care  Patient;Family member/caregiver    Family Member Consulted  husband       Patient will benefit from skilled therapeutic intervention in order to improve the following deficits and impairments:  Abnormal gait, Decreased balance, Decreased cognition, Decreased coordination, Decreased strength, Difficulty walking, Dizziness, Impaired sensation, Impaired vision/preception, Pain, Decreased activity tolerance, Decreased mobility, Decreased endurance, Postural dysfunction  Visit Diagnosis: Ataxic gait  Unsteadiness on feet  Other abnormalities of gait and mobility     Problem List Patient Active Problem List   Diagnosis Date Noted  . 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 Weakness with dizziness-  since [facility] and CVA 04/07/17 08/07/2017  . Disturbances of vision, late effect of stroke 08/06/2017  . Health education/counseling 08/05/2017  . High risk medications (not anticoagulants) long-term use 08/05/2017  . Neuritis-  R sided:  arm and leg/ body due to stroke 08/05/2017  . Elevated LDL cholesterol level 07/11/2017  . [employer] (NIH) Stroke Scale limb ataxia score 2, ataxia present in two limbs 06/25/2017  . 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 Elevated vitamin B12 level 05/30/2017  . Vitamin D deficiency 05/29/2017  . History of tobacco abuse-  30pk yr hx - quit 04/07/17 05/21/2017  . Gait disturbance, post-stroke 05/14/2017  . Benign essential HTN   . Vitreous hemorrhage of right eye ([place])   . Adjustment disorder  with mixed anxiety and depressed mood   . 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 s/p SAH (subarachnoid hemorrhage) ([place]) 04/19/2017  . 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 Hypoxia   . Subarachnoid hemorrhage due to ruptured aneurysm ([place]) 04/07/2017  . CVA (cerebral vascular accident) ([place]) 04/07/2017  . Elevated gastrin level 01/25/2012  . Hypokalemia 01/21/2012  . Nausea & vomiting 01/20/2012  .  Epigastric pain 01/20/2012  . Duodenal ulcer, acute with obstruction 10/17/2011  . S/P laparoscopic cholecystectomy 10/16/2011    Willow Ora 04/16/2018, 11:25 PM  [facility] [address] [place], [place], [address] Phone: [phone]   Fax:  [phone]  Name: [name] Barnett MRN: [id number] Date of Birth: 1976-08-16

## 2018-04-17 ENCOUNTER — Encounter: Payer: Self-pay | Admitting: Physical Therapy

## 2018-04-17 ENCOUNTER — Ambulatory Visit: Payer: BLUE CROSS/BLUE SHIELD | Admitting: Physical Therapy

## 2018-04-17 DIAGNOSIS — R278 Other lack of coordination: Secondary | ICD-10-CM

## 2018-04-17 DIAGNOSIS — R260 Ataxic gait: Secondary | ICD-10-CM

## 2018-04-17 DIAGNOSIS — R2689 Other abnormalities of gait and mobility: Secondary | ICD-10-CM

## 2018-04-17 DIAGNOSIS — R2681 Unsteadiness on feet: Secondary | ICD-10-CM

## 2018-04-17 NOTE — Therapy (Signed)
[facility] [address] [place], [place], [address] Phone: [phone]   Fax:  [phone]  Physical Therapy Treatment  Patient Details  Name: [name] Barnett MRN: [id number] Date of Birth: May 07, 1976 Referring Provider: Charlett Blake, MD   Encounter Date: 04/17/2018  PT End of Session - 04/17/18 1649    Visit Number  65    Number of Visits  31    Date for PT Re-Evaluation  05/16/18 date corrected to reflect 43 day cert    Authorization Type  BCBS    PT Start Time  1536    PT Stop Time  1620    PT Time Calculation (min)  44 min    Activity Tolerance  Patient limited by pain    Behavior During Therapy  [facility] for tasks assessed/performed       Past Medical History:  Diagnosis Date  . Duodenal obstruction   . GERD (gastroesophageal reflux disease)   . Hypertension   . Stroke [facility])    april 2018    Past Surgical History:  Procedure Laterality Date  . ABDOMINAL HYSTERECTOMY    . BALLOON DILATION  12/31/2011   Procedure: BALLOON DILATION;  Surgeon: Missy Sabins, MD;  Location: [facility] ENDOSCOPY;  Service: Endoscopy;  Laterality: N/A;  . CHOLECYSTECTOMY  07/28/11  . ESOPHAGOGASTRODUODENOSCOPY  10/17/2011   Procedure: ESOPHAGOGASTRODUODENOSCOPY (EGD);  Surgeon: Missy Sabins, MD;  Location: [facility] ENDOSCOPY;  Service: Endoscopy;  Laterality: N/A;  . IR ANGIO INTRA EXTRACRAN SEL INTERNAL CAROTID BILAT MOD SED  04/07/2017  . IR ANGIO INTRA EXTRACRAN SEL INTERNAL CAROTID BILAT MOD SED  01/07/2018  . IR ANGIO VERTEBRAL SEL VERTEBRAL UNI R MOD SED  04/07/2017  . IR ANGIO VERTEBRAL SEL VERTEBRAL UNI R MOD SED  01/07/2018  . IR ANGIOGRAM FOLLOW UP STUDY  04/07/2017  . IR ANGIOGRAM FOLLOW UP STUDY  04/07/2017  . IR ANGIOGRAM FOLLOW UP STUDY  04/07/2017  . IR ANGIOGRAM FOLLOW UP STUDY  04/07/2017  . IR ANGIOGRAM FOLLOW UP STUDY  04/07/2017  . IR ANGIOGRAM SELECTIVE EACH ADDITIONAL VESSEL  04/07/2017  . IR TRANSCATH/EMBOLIZ  04/07/2017  . IR  US GUIDE VASC ACCESS RIGHT  01/07/2018  . PARS PLANA VITRECTOMY Right 05/01/2017   Procedure: PARS PLANA VITRECTOMY WITH 25 GAUGE RIGHT EYE, endolaser photocoaglation;  Surgeon: Jalene Mullet, MD;  Location: [place];  Service: Ophthalmology;  Laterality: Right;  . RADIOLOGY WITH ANESTHESIA N/A 04/07/2017   Procedure: RADIOLOGY WITH ANESTHESIA;  Surgeon: Consuella Lose, MD;  Location: [place];  Service: Radiology;  Laterality: N/A;  . REPLACEMENT TOTAL KNEE BILATERAL  2004  . TEMPOROMANDIBULAR JOINT SURGERY     2 surgeries  . TUMOR REMOVAL    . VAGOTOMY  01/23/2012   Procedure: VAGOTOMY, antrectomy and BII;  Surgeon: Haywood Lasso, MD;  Location: [place];  Service: General;  Laterality: N/A;  Laparotomy with vagotomy.    There were no vitals filed for this visit.  Subjective Assessment - 04/17/18 1543    Subjective  Had a voicemail from Dr. Letta Pate - would like to discuss hip injection with pt on Monday for OA.      Patient is accompained by:  Family member    Limitations  Standing;Walking    Patient Stated Goals  To walk safely without walker, improve balance so no more falls.    Currently in Pain?  Yes    Pain Score  2     Pain Location  Knee  Pain Orientation  Right;Posterior    Pain Descriptors / Indicators  Sore    Pain Onset  More than a month ago         [facility] PT Assessment - 04/17/18 1613      Standardized Balance Assessment   10 Meter Walk  15.37 seconds or 2.13 ft/sec without AD, min A      Dynamic Gait Index   Level Surface  Moderate Impairment    Change in Gait Speed  Mild Impairment    Gait with Horizontal Head Turns  Moderate Impairment    Gait with Vertical Head Turns  Mild Impairment    Gait and Pivot Turn  Mild Impairment    Step Over Obstacle  Moderate Impairment    Step Around Obstacles  Mild Impairment    Steps  Mild Impairment    Total Score  13    DGI comment:  13/24 without AD                   [facility] Adult PT Treatment/Exercise -  04/17/18 1640      Transfers   Floor to Transfer  5: Supervision;With upper extremity assist    Floor to Transfer Details (indicate cue type and reason)  supervision due to continued tendency to fall forwards      Neuro Re-ed    Neuro Re-ed Details   Performed NMR in tall kneeling for controlled weight shifting beginning with bilat UE support on physioball shifting forwards pushing ball forwards and returning to midline x 8 reps, laterally to L and R with one UE on ball focusing on initiation of weight shift from pelvis.  Required assistance for full weight shift to R due to R hip pain             PT Education - 04/17/18 1649    Education provided  Yes    Education Details  progress with gait    Person(s) Educated  Patient;Parent(s)    Methods  Explanation    Comprehension  Verbalized understanding       PT Short Term Goals - 04/17/18 1650      PT SHORT TERM GOAL #1   Title  Pt will improve DGI to >/= 17/24 without AD    Baseline  12/24 without AD    Time  4    Period  Weeks    Status  Not Met      PT SHORT TERM GOAL #2   Title  Pt will decrease falls risk with community gait as indicated by increase in gait velocity without device except Bioness to > or = 2.6 ft/sec    Baseline  2.13 ft/sec without AD improved but not to goal    Time  4    Period  Weeks    Status  Partially Met      PT SHORT TERM GOAL #3   Title  Pt will decrease falls risk as indicated by increase in BERG balance score to > or = 49/56    Baseline  04/15/18: 49/56 scored today    Status  Achieved      PT SHORT TERM GOAL #4   Title  Pt will ambulate >300' on indoor surfaces without AD with Bioness with supervision    Time  4    Period  Weeks    Status  On-going        PT Long Term Goals - 03/27/18 1955      PT LONG TERM  GOAL #1   Title  Pt and husband will demonstrate independence with HEP, including use of treadmill    Baseline  03/12/18: met with current program issued to date- corner  balance, treadmill routine    Time  8    Period  Weeks    Status  On-going    Target Date  05/16/18      PT LONG TERM GOAL #2   Title  Pt will demonstrate improved balance and decreased falls risk as indicated by BERG balance score of > or = 52/56    Baseline  03/12/18: 46/56 - no change from last assessment    Time  8    Period  Weeks    Status  On-going    Target Date  05/16/18      PT LONG TERM GOAL #3   Title  Pt will ambulate without AD x 350' over outdoor paved surfaces with supervision; will negotiate 4 stairs with one rail with alternating sequence and supervision    Baseline  03/12/18: can negotiate steps with single rail reciprocally with supervision, continues to vary in assistance needed with gait without AD on level indoor surfaces- min to mod at times    Time  8    Period  Weeks    Status  Revised    Target Date  05/16/18      PT LONG TERM GOAL #4   Title  Pt will report improvement in Neuro QOL-LE to >/= 40%    Baseline  03/17/2018 Neuro QOL 34.3    Time  8    Period  Weeks    Status  On-going    Target Date  05/16/18      PT LONG TERM GOAL #5   Title  Pt will decrease falls risk during gait in home/community as indicated by increase in gait velocity to > or = 3.0 ft/sec    Baseline  03/14/18: 2.51 ft/sec with RW, increased from 2.4 ft/sec at last assessment    Time  8    Status  On-going    Target Date  05/16/18      PT LONG TERM GOAL #6   Title  Pt will demonstrate decreased falls risk with gait as indicated by increase in DGI score to >/= 19/24 without AD    Baseline  03/14/18: 14/24 today, decreased by 1 point from last assessment.     Time  8    Period  Weeks    Status  On-going    Target Date  05/16/18            Plan - 04/17/18 1653    Clinical Impression Statement  Continued reassessment of STG with pt performing gait velocity and DGI without AD today.  Pt's gait velocity and motor control during gait without AD has improved but not to goal level and  DGI score remained unchanged but pt was able to perform without RW today (previous score was with RW) demonstrating improvement in balance to maintain score with decreased UE support.  Continued NMR for controlled weight shifting with UE support on unstable surface; pt continues to be limited in her ability to perform lateral weight shifts due to R hip/groin pain.  Will continue to progress as pt is able to tolerate.    Rehab Potential  Good    PT Frequency  2x / week    PT Duration  8 weeks    PT Treatment/Interventions  ADLs/Self Care Home Management;Aquatic Therapy;DME Instruction;Gait training;Stair  training;Functional mobility training;Therapeutic activities;Therapeutic exercise;Balance training;Neuromuscular re-education;Patient/family education;Vestibular;Visual/perceptual remediation/compensation;Cognitive remediation;Orthotic Fit/Training;Electrical Stimulation    PT Next Visit Plan  check final STG with Bioness; discuss Home Bioness unit?  Check BP. continue with use of Bioness to left LE: gait on treamill both single and double LE's, LLE on treadmill moving belt but without belt on; tall and half kneeling, use of theraband or sports cord to force weight shift to L, weight shifting on rockerboard.  Corner balance and finding midline    Consulted and Agree with Plan of Care  Patient;Family member/caregiver    Family Member Consulted  mom       Patient will benefit from skilled therapeutic intervention in order to improve the following deficits and impairments:  Abnormal gait, Decreased balance, Decreased cognition, Decreased coordination, Decreased strength, Difficulty walking, Dizziness, Impaired sensation, Impaired vision/preception, Pain, Decreased activity tolerance, Decreased mobility, Decreased endurance, Postural dysfunction  Visit Diagnosis: Ataxic gait  Other lack of coordination  Other abnormalities of gait and mobility  Unsteadiness on feet     Problem List Patient  Active Problem List   Diagnosis Date Noted  . 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 Weakness with dizziness-  since [facility] and CVA 04/07/17 08/07/2017  . Disturbances of vision, late effect of stroke 08/06/2017  . Health education/counseling 08/05/2017  . High risk medications (not anticoagulants) long-term use 08/05/2017  . Neuritis-  R sided:  arm and leg/ body due to stroke 08/05/2017  . Elevated LDL cholesterol level 07/11/2017  . [employer] (NIH) Stroke Scale limb ataxia score 2, ataxia present in two limbs 06/25/2017  . 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 Elevated vitamin B12 level 05/30/2017  . Vitamin D deficiency 05/29/2017  . History of tobacco abuse-  30pk yr hx - quit 04/07/17 05/21/2017  . Gait disturbance, post-stroke 05/14/2017  . Benign essential HTN   . Vitreous hemorrhage of right eye ([place])   . Adjustment disorder with mixed anxiety and depressed mood   . 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 s/p SAH (subarachnoid hemorrhage) ([place]) 04/19/2017  . 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 Hypoxia   . Subarachnoid hemorrhage due to ruptured aneurysm ([place]) 04/07/2017  . CVA (cerebral vascular accident) ([place]) 04/07/2017  . Elevated gastrin level 01/25/2012  . Hypokalemia 01/21/2012  . Nausea & vomiting 01/20/2012  . Epigastric pain 01/20/2012  . Duodenal ulcer, acute with obstruction 10/17/2011  . S/P laparoscopic cholecystectomy 10/16/2011    Rico Junker, PT, DPT 04/17/18    4:57 PM    [facility] [address] [place], [place], [address] Phone: [phone]   Fax:  [phone]  Name: [name] Barnett MRN: [id number] Date of Birth: 05-22-1976

## 2018-04-21 ENCOUNTER — Encounter: Payer: Self-pay | Admitting: Physical Medicine & Rehabilitation

## 2018-04-21 ENCOUNTER — Ambulatory Visit: Payer: BLUE CROSS/BLUE SHIELD | Admitting: Physical Medicine & Rehabilitation

## 2018-04-21 ENCOUNTER — Encounter: Payer: BLUE CROSS/BLUE SHIELD | Attending: Physical Medicine & Rehabilitation

## 2018-04-21 VITALS — BP 125/84 | HR 77 | Resp 14 | Ht 64.0 in | Wt 167.0 lb

## 2018-04-21 DIAGNOSIS — I725 Aneurysm of other precerebral arteries: Secondary | ICD-10-CM | POA: Insufficient documentation

## 2018-04-21 DIAGNOSIS — R42 Dizziness and giddiness: Secondary | ICD-10-CM | POA: Diagnosis not present

## 2018-04-21 DIAGNOSIS — R269 Unspecified abnormalities of gait and mobility: Secondary | ICD-10-CM | POA: Diagnosis not present

## 2018-04-21 DIAGNOSIS — R209 Unspecified disturbances of skin sensation: Secondary | ICD-10-CM | POA: Diagnosis not present

## 2018-04-21 DIAGNOSIS — I69398 Other sequelae of cerebral infarction: Secondary | ICD-10-CM | POA: Diagnosis not present

## 2018-04-21 DIAGNOSIS — I69093 Ataxia following nontraumatic subarachnoid hemorrhage: Secondary | ICD-10-CM | POA: Insufficient documentation

## 2018-04-21 DIAGNOSIS — Z789 Other specified health status: Secondary | ICD-10-CM

## 2018-04-21 DIAGNOSIS — M1611 Unilateral primary osteoarthritis, right hip: Secondary | ICD-10-CM

## 2018-04-21 DIAGNOSIS — G5720 Lesion of femoral nerve, unspecified lower limb: Secondary | ICD-10-CM | POA: Diagnosis not present

## 2018-04-21 NOTE — Patient Instructions (Signed)
Take tramadol 2 tabs twice a day, call us for refill  If this is helpful

## 2018-04-21 NOTE — Progress Notes (Signed)
Subjective:    Patient ID: [name] Barnett, female    DOB: 04-27-1976, 42 y.o.   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  No falls or trauma  Have reviewed Xrays with patient  Cont to amb with walker Pain Inventory Average Pain 8 Pain Right Now 8 My pain is sharp, burning, stabbing and aching  In the last 24 hours, has pain interfered with the following? General activity 5 Relation with others 5 Enjoyment of life 5 What TIME of day is your pain at its worst? all Sleep (in general) Good  Pain is worse with: sitting, inactivity, standing and some activites Pain improves with: therapy/exercise and pacing activities Relief from Meds: 1  Mobility walk with assistance use a walker how many minutes can you walk? ? ability to climb steps?  yes do you drive?  no Do you have any goals in this area?  yes  Function not employed: date last employed . I need assistance with the following:  bathing, meal prep and household duties Do you have any goals in this area?  no  Neuro/Psych bladder control problems numbness  Prior Studies Any changes since last visit?  no  Physicians involved in your care Any changes since last visit?  no   Family History  Problem Relation Age of Onset  . Hypertension Mother   . Restless legs syndrome Mother   . Hyperlipidemia Mother   . Heart disease Father   . Malignant hyperthermia Neg Hx    Social History   Socioeconomic History  . Marital status: Married    Spouse name: Not on  file  . Number of children: Not on file  . Years of education: Not on file  . Highest education level: Not on file  Occupational History  . Not on file  Social Needs  . Financial resource strain: Not on file  . Food insecurity:    Worry: Not on file    Inability: Not on file  . Transportation needs:    Medical: Not on file    Non-medical: Not on file  Tobacco Use  . Smoking status: Former Smoker    Packs/day: 1.00    Years: 20.00    Pack years: 20.00    Types: Cigarettes    Last attempt to quit: 04/07/2017    Years since quitting: 1.0  . Smokeless tobacco: Never Used  Substance and Sexual Activity  . Alcohol use: No  . Drug use: No  . Sexual activity: Not Currently    Partners: Male  Lifestyle  . Physical activity:    Days per week: Not on file    Minutes per session: Not on file  . Stress: Not on file  Relationships  . Social connections:    Talks on phone: Not on file    Gets together: Not on file    Attends religious service: Not on file    Active member of club or organization: Not on file    Attends meetings of clubs or organizations: Not on file    Relationship status: Not on  file  Other Topics Concern  . Not on file  Social History Narrative   Lives in [place] with husband and 2 kids. Works as a Psychologist, prison and probation services.   Past Surgical History:  Procedure Laterality Date  . ABDOMINAL HYSTERECTOMY    . BALLOON DILATION  12/31/2011   Procedure: BALLOON DILATION;  Surgeon: Barrie Folk, MD;  Location: [facility] ENDOSCOPY;  Service: Endoscopy;  Laterality: N/A;  . CHOLECYSTECTOMY  07/28/11  . ESOPHAGOGASTRODUODENOSCOPY  10/17/2011   Procedure: ESOPHAGOGASTRODUODENOSCOPY (EGD);  Surgeon: Barrie Folk, MD;  Location: [facility] ENDOSCOPY;  Service: Endoscopy;  Laterality: N/A;  . IR ANGIO INTRA EXTRACRAN SEL INTERNAL CAROTID BILAT MOD SED  04/07/2017  . IR ANGIO INTRA EXTRACRAN SEL INTERNAL CAROTID BILAT MOD SED  01/07/2018  . IR ANGIO VERTEBRAL SEL VERTEBRAL UNI R MOD SED  04/07/2017  .  IR ANGIO VERTEBRAL SEL VERTEBRAL UNI R MOD SED  01/07/2018  . IR ANGIOGRAM FOLLOW UP STUDY  04/07/2017  . IR ANGIOGRAM FOLLOW UP STUDY  04/07/2017  . IR ANGIOGRAM FOLLOW UP STUDY  04/07/2017  . IR ANGIOGRAM FOLLOW UP STUDY  04/07/2017  . IR ANGIOGRAM FOLLOW UP STUDY  04/07/2017  . IR ANGIOGRAM SELECTIVE EACH ADDITIONAL VESSEL  04/07/2017  . IR TRANSCATH/EMBOLIZ  04/07/2017  . IR US GUIDE VASC ACCESS RIGHT  01/07/2018  . PARS PLANA VITRECTOMY Right 05/01/2017   Procedure: PARS PLANA VITRECTOMY WITH 25 GAUGE RIGHT EYE, endolaser photocoaglation;  Surgeon: Carmela Rima, MD;  Location: [facility] OR;  Service: Ophthalmology;  Laterality: Right;  . RADIOLOGY WITH ANESTHESIA N/A 04/07/2017   Procedure: RADIOLOGY WITH ANESTHESIA;  Surgeon: Lisbeth Renshaw, MD;  Location: [facility] OR;  Service: Radiology;  Laterality: N/A;  . REPLACEMENT TOTAL KNEE BILATERAL  2004  . TEMPOROMANDIBULAR JOINT SURGERY     2 surgeries  . TUMOR REMOVAL    . VAGOTOMY  01/23/2012   Procedure: VAGOTOMY, antrectomy and BII;  Surgeon: Currie Paris, MD;  Location: [facility] OR;  Service: General;  Laterality: N/A;  Laparotomy with vagotomy.   Past Medical History:  Diagnosis Date  . Duodenal obstruction   . GERD (gastroesophageal reflux disease)   . Hypertension   . Stroke ([name])    april 2018   BP 125/84 (BP Location: Right Arm, Patient Position: Sitting, Barnett Size: Normal)   Pulse 77   Resp 14   Ht  (1.626 m)   Wt 167 lb (75.8 kg)   SpO2 94%   BMI 28.67 kg/m   Opioid Risk Score:   Fall Risk Score:  `1  Depression screen PHQ 2/9  Depression screen [facility] 2/9 02/17/2018 01/13/2018 08/01/2017 07/11/2017 05/14/2017  Decreased Interest 2 1 0 2 3  Down, Depressed, Hopeless 1 0 0 1 2  PHQ - 2 Score 3 1 0 3 5  Altered sleeping 0 0 0 0 3  Tired, decreased energy Change in appetite 0 0 0 0 1  Feeling bad or failure about yourself  0 0 0 0 1  Trouble concentrating 0 0 0 1 2  Moving slowly or fidgety/restless 0 0 0 3 2    Suicidal thoughts 0 0 0 1 0  PHQ-9 Score Difficult doing work/chores Not difficult at all Somewhat difficult - - Very difficult    Review of Systems  Constitutional: Negative.   HENT: Negative.   Eyes: Negative.   Respiratory: Negative.   Cardiovascular: Negative.   Gastrointestinal: Negative.  Endocrine: Negative.   Genitourinary: Positive for difficulty urinating.  Musculoskeletal: Positive for arthralgias and gait problem.  Skin: Negative.   Allergic/Immunologic: Negative.   Neurological: Positive for numbness.  Hematological: Negative.   Psychiatric/Behavioral: Negative.   All other systems reviewed and are negative.      Objective:   Physical Exam  Constitutional: She is oriented to person, place, and time. She appears well-developed and well-nourished.  HENT:  Head: Normocephalic and atraumatic.  Eyes:  Left eye with reduced adduction, no ptosis remaining  Neurological: She is alert and oriented to person, place, and time.  Psychiatric: Her affect is inappropriate. She is agitated.    Left side ataxia  Reduced sensation Right thigh  Reduced ROM and pain with Right hip IR/ER,  No pain with Left hip IR/ER      Assessment & Plan:  1.  Left midbrain infarct with Left hemiataxia, right spinothalamic involvement, Left partial CN III palsy, gait disorder   2.  Right hip pain likely multifactorial, will cont gabapentin for neurogenic componenet Schedule RIght hip intra artic injection under fluoro for Hip OA symptoms

## 2018-04-22 ENCOUNTER — Encounter: Payer: Self-pay | Admitting: Physical Therapy

## 2018-04-22 ENCOUNTER — Ambulatory Visit: Payer: BLUE CROSS/BLUE SHIELD | Admitting: Physical Therapy

## 2018-04-22 VITALS — BP 118/79 | HR 71

## 2018-04-22 DIAGNOSIS — R42 Dizziness and giddiness: Secondary | ICD-10-CM

## 2018-04-22 DIAGNOSIS — R278 Other lack of coordination: Secondary | ICD-10-CM

## 2018-04-22 DIAGNOSIS — R2681 Unsteadiness on feet: Secondary | ICD-10-CM

## 2018-04-22 DIAGNOSIS — I69054 Hemiplegia and hemiparesis following nontraumatic subarachnoid hemorrhage affecting left non-dominant side: Secondary | ICD-10-CM

## 2018-04-22 DIAGNOSIS — R260 Ataxic gait: Secondary | ICD-10-CM | POA: Diagnosis not present

## 2018-04-22 DIAGNOSIS — R2689 Other abnormalities of gait and mobility: Secondary | ICD-10-CM

## 2018-04-23 NOTE — Therapy (Signed)
[facility] [address] [place], [place], [address] Phone: [phone]   Fax:  [phone]  Physical Therapy Treatment  Patient Details  Name: [name] Barnett MRN: [id number] Date of Birth: December 21, 1975 Referring Provider: Charlett Blake, MD   Encounter Date: 04/22/2018  PT End of Session - 04/22/18 1504    Visit Number  35    Number of Visits  24    Date for PT Re-Evaluation  05/16/18 date corrected to reflect 29 day cert    Authorization Type  BCBS    PT Start Time  1450    PT Stop Time  1530    PT Time Calculation (min)  40 min    Equipment Utilized During Treatment  Gait belt    Activity Tolerance  Patient limited by pain    Behavior During Therapy  [facility] for tasks assessed/performed       Past Medical History:  Diagnosis Date  . Duodenal obstruction   . GERD (gastroesophageal reflux disease)   . Hypertension   . Stroke [facility])    april 2018    Past Surgical History:  Procedure Laterality Date  . ABDOMINAL HYSTERECTOMY    . BALLOON DILATION  12/31/2011   Procedure: BALLOON DILATION;  Surgeon: Missy Sabins, MD;  Location: [facility] ENDOSCOPY;  Service: Endoscopy;  Laterality: N/A;  . CHOLECYSTECTOMY  07/28/11  . ESOPHAGOGASTRODUODENOSCOPY  10/17/2011   Procedure: ESOPHAGOGASTRODUODENOSCOPY (EGD);  Surgeon: Missy Sabins, MD;  Location: [facility] ENDOSCOPY;  Service: Endoscopy;  Laterality: N/A;  . IR ANGIO INTRA EXTRACRAN SEL INTERNAL CAROTID BILAT MOD SED  04/07/2017  . IR ANGIO INTRA EXTRACRAN SEL INTERNAL CAROTID BILAT MOD SED  01/07/2018  . IR ANGIO VERTEBRAL SEL VERTEBRAL UNI R MOD SED  04/07/2017  . IR ANGIO VERTEBRAL SEL VERTEBRAL UNI R MOD SED  01/07/2018  . IR ANGIOGRAM FOLLOW UP STUDY  04/07/2017  . IR ANGIOGRAM FOLLOW UP STUDY  04/07/2017  . IR ANGIOGRAM FOLLOW UP STUDY  04/07/2017  . IR ANGIOGRAM FOLLOW UP STUDY  04/07/2017  . IR ANGIOGRAM FOLLOW UP STUDY  04/07/2017  . IR ANGIOGRAM SELECTIVE EACH ADDITIONAL VESSEL   04/07/2017  . IR TRANSCATH/EMBOLIZ  04/07/2017  . IR US GUIDE VASC ACCESS RIGHT  01/07/2018  . PARS PLANA VITRECTOMY Right 05/01/2017   Procedure: PARS PLANA VITRECTOMY WITH 25 GAUGE RIGHT EYE, endolaser photocoaglation;  Surgeon: Jalene Mullet, MD;  Location: [place];  Service: Ophthalmology;  Laterality: Right;  . RADIOLOGY WITH ANESTHESIA N/A 04/07/2017   Procedure: RADIOLOGY WITH ANESTHESIA;  Surgeon: Consuella Lose, MD;  Location: [place] [facility];  Service: Radiology;  Laterality: N/A;  . REPLACEMENT TOTAL KNEE BILATERAL  2004  . TEMPOROMANDIBULAR JOINT SURGERY     2 surgeries  . TUMOR REMOVAL    . VAGOTOMY  01/23/2012   Procedure: VAGOTOMY, antrectomy and BII;  Surgeon: Haywood Lasso, MD;  Location: [place];  Service: General;  Laterality: N/A;  Laparotomy with vagotomy.    Vitals:   04/22/18 1505  BP: 118/79  Pulse: 71    Subjective Assessment - 04/22/18 1455    Subjective  Saw Dr. Letta Pate who is wanting to do the injection and per pt refused to do any imaging as he is adamant it is OA.     Patient is accompained by:  Family member    Limitations  Standing;Walking           [place] Adult PT Treatment/Exercise - 04/22/18 2209  Self-Care   Self-Care  Other Self-Care Comments    Other Self-Care Comments   continued to address pt's concerns regarding her hip pain. She is upset that her MD wants to do an injection without further workup to determine if something else besides OA is going on. Spouse and pt considering a second opion. She is just unsure of whether to go through with the injection before knowing for sure. Re-inforced it's up to them and they are allowed to get second opions if they choose.                        Balance Exercises - 04/22/18 1521      Balance Exercises: Standing   Standing Eyes Closed  Wide (BOA);Head turns;Foam/compliant surface;Other reps (comment);30 secs;Limitations    Tandem Stance  Eyes open;Foam/compliant surface    SLS with Vectors   Foam/compliant surface;Other reps (comment);Limitations      Balance Exercises: Standing   Standing Eyes Closed Limitations  on airex: feet hip width apart without any UE support- EC no head movements x 3 reps, progressing to EC head movements left<>right and up<>down x 10 reps. min guard to min assist for balance. cues on posture and weight shifting                               Tandem Stance Limitations  on airex with chair in front for safety: with EO held position for 3 reps with each foot forward. intermittent UE support needed on chair back with up to min assist.     SLS with Vectors Limitations  on aires with 2 tall cones in front: alternating fwd toe taps to each cone, then alternating cross toe taps to cones. min guard to min assist for balance with cues on posture and weight shifting.                                   PT Short Term Goals - 04/22/18 1504      PT SHORT TERM GOAL #1   Title  Pt will improve DGI to >/= 17/24 without AD    Baseline  12/24 without AD    Status  Not Met      PT SHORT TERM GOAL #2   Title  Pt will decrease falls risk with community gait as indicated by increase in gait velocity without device except Bioness to > or = 2.6 ft/sec    Baseline  2.13 ft/sec without AD improved but not to goal    Status  Partially Met      PT SHORT TERM GOAL #3   Title  Pt will decrease falls risk as indicated by increase in BERG balance score to > or = 49/56    Baseline  04/15/18: 49/56 scored today    Status  Achieved      PT SHORT TERM GOAL #4   Title  Pt will ambulate >300' on indoor surfaces without AD with Bioness with supervision    Time  4    Period  Weeks    Status  On-going        PT Long Term Goals - 03/27/18 1955      PT LONG TERM GOAL #1   Title  Pt and husband will demonstrate independence with HEP, including use of treadmill    Baseline  03/12/18: met with current program issued to date- corner balance, treadmill routine    Time  8    Period  Weeks     Status  On-going    Target Date  05/16/18      PT LONG TERM GOAL #2   Title  Pt will demonstrate improved balance and decreased falls risk as indicated by BERG balance score of > or = 52/56    Baseline  03/12/18: 46/56 - no change from last assessment    Time  8    Period  Weeks    Status  On-going    Target Date  05/16/18      PT LONG TERM GOAL #3   Title  Pt will ambulate without AD x 350' over outdoor paved surfaces with supervision; will negotiate 4 stairs with one rail with alternating sequence and supervision    Baseline  03/12/18: can negotiate steps with single rail reciprocally with supervision, continues to vary in assistance needed with gait without AD on level indoor surfaces- min to mod at times    Time  8    Period  Weeks    Status  Revised    Target Date  05/16/18      PT LONG TERM GOAL #4   Title  Pt will report improvement in Neuro QOL-LE to >/= 40%    Baseline  03/17/2018 Neuro QOL 34.3    Time  8    Period  Weeks    Status  On-going    Target Date  05/16/18      PT LONG TERM GOAL #5   Title  Pt will decrease falls risk during gait in home/community as indicated by increase in gait velocity to > or = 3.0 ft/sec    Baseline  03/14/18: 2.51 ft/sec with RW, increased from 2.4 ft/sec at last assessment    Time  8    Status  On-going    Target Date  05/16/18      PT LONG TERM GOAL #6   Title  Pt will demonstrate decreased falls risk with gait as indicated by increase in DGI score to >/= 19/24 without AD    Baseline  03/14/18: 14/24 today, decreased by 1 point from last assessment.     Time  8    Period  Weeks    Status  On-going    Target Date  05/16/18            Plan - 04/22/18 1504    Clinical Impression Statement  Today's skilled session continued to address balance reactions on compliant surfaces. Pt did have increased pain at times with standing activities needing seated rest breaks. Unable to check final STG as pt had on pant and unable to don Bioness.  Pt to wear shorts next visit to resume use of Bioness. Pt is making slow, steady progress toward goals and should benefit from continued PT to progress toward unmet goals.     Rehab Potential  Good    PT Frequency  2x / week    PT Duration  8 weeks    PT Treatment/Interventions  ADLs/Self Care Home Management;Aquatic Therapy;DME Instruction;Gait training;Stair training;Functional mobility training;Therapeutic activities;Therapeutic exercise;Balance training;Neuromuscular re-education;Patient/family education;Vestibular;Visual/perceptual remediation/compensation;Cognitive remediation;Orthotic Fit/Training;Electrical Stimulation    PT Next Visit Plan  check final STG with Bioness; discuss Home Bioness unit?  Check BP. continue with use of Bioness to left LE: gait on treamill both single and double LE's, LLE on treadmill moving belt but without belt  on; tall and half kneeling, use of theraband or sports cord to force weight shift to L, weight shifting on rockerboard.  Corner balance and finding midline    Consulted and Agree with Plan of Care  Patient;Family member/caregiver    Family Member Consulted  spouse       Patient will benefit from skilled therapeutic intervention in order to improve the following deficits and impairments:  Abnormal gait, Decreased balance, Decreased cognition, Decreased coordination, Decreased strength, Difficulty walking, Dizziness, Impaired sensation, Impaired vision/preception, Pain, Decreased activity tolerance, Decreased mobility, Decreased endurance, Postural dysfunction  Visit Diagnosis: Other lack of coordination  Other abnormalities of gait and mobility  Unsteadiness on feet  Dizziness and giddiness  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 Weakness with dizziness-   since [facility] and CVA 04/07/17 08/07/2017  . Disturbances of vision, late effect of stroke 08/06/2017  . Health education/counseling 08/05/2017  . High risk medications (not anticoagulants) long-term use 08/05/2017  . Neuritis-  R sided:  arm and leg/ body due to stroke 08/05/2017  . Elevated LDL cholesterol level 07/11/2017  . [employer] [facility] [employer] (NIH) Stroke Scale limb ataxia score 2, ataxia present in two limbs 06/25/2017  . 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 Elevated vitamin B12 level 05/30/2017  . Vitamin D deficiency 05/29/2017  . History of tobacco abuse-  30pk yr hx - quit 04/07/17 05/21/2017  . Gait disturbance, post-stroke 05/14/2017  . Benign essential HTN   . Vitreous hemorrhage of right eye ([place])   . Adjustment disorder with mixed anxiety and depressed mood   . 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 s/p SAH (subarachnoid hemorrhage) ([place]) 04/19/2017  . Basilar artery aneurysm ([place])   . Hypoxia   . Subarachnoid hemorrhage due to ruptured aneurysm ([place]) 04/07/2017  . CVA (cerebral vascular accident) ([place]) 04/07/2017  . Elevated gastrin level 01/25/2012  . Hypokalemia 01/21/2012  . Nausea & vomiting 01/20/2012  . Epigastric pain 01/20/2012  . Duodenal ulcer, acute with obstruction 10/17/2011  . S/P laparoscopic cholecystectomy 10/16/2011    Willow Ora, PTA, [facility] Neuro [facility] [address], [place], [place] [address] [phone] 04/23/18, 10:37 PM   Name: [name] Barnett MRN: [id number] Date of Birth: 03-08-1976

## 2018-04-24 ENCOUNTER — Ambulatory Visit: Payer: BLUE CROSS/BLUE SHIELD | Admitting: Physical Therapy

## 2018-04-29 ENCOUNTER — Encounter: Payer: Self-pay | Admitting: Physical Therapy

## 2018-04-29 ENCOUNTER — Ambulatory Visit: Payer: BLUE CROSS/BLUE SHIELD | Admitting: Physical Therapy

## 2018-04-29 DIAGNOSIS — R42 Dizziness and giddiness: Secondary | ICD-10-CM

## 2018-04-29 DIAGNOSIS — R278 Other lack of coordination: Secondary | ICD-10-CM

## 2018-04-29 DIAGNOSIS — R2681 Unsteadiness on feet: Secondary | ICD-10-CM

## 2018-04-29 DIAGNOSIS — R260 Ataxic gait: Secondary | ICD-10-CM | POA: Diagnosis not present

## 2018-04-29 DIAGNOSIS — R2689 Other abnormalities of gait and mobility: Secondary | ICD-10-CM

## 2018-04-29 DIAGNOSIS — I69054 Hemiplegia and hemiparesis following nontraumatic subarachnoid hemorrhage affecting left non-dominant side: Secondary | ICD-10-CM

## 2018-04-30 NOTE — Therapy (Signed)
[facility] [address] [place], [place], [address] Phone: [phone]   Fax:  [phone]  Physical Therapy Treatment  Patient Details  Name: [name] Barnett MRN: [id number] Date of Birth: 20-Oct-1976 Referring Provider: Charlett Blake, MD   Encounter Date: 04/29/2018  PT End of Session - 04/29/18 1458    Visit Number  36    Number of Visits  68    Date for PT Re-Evaluation  05/16/18 date corrected to reflect 11 day cert    Authorization Type  BCBS    PT Start Time  1450    PT Stop Time  1530    PT Time Calculation (min)  40 min    Equipment Utilized During Treatment  Gait belt    Activity Tolerance  Patient limited by pain    Behavior During Therapy  [facility] for tasks assessed/performed       Past Medical History:  Diagnosis Date  . Duodenal obstruction   . GERD (gastroesophageal reflux disease)   . Hypertension   . Stroke [facility])    april 2018    Past Surgical History:  Procedure Laterality Date  . ABDOMINAL HYSTERECTOMY    . BALLOON DILATION  12/31/2011   Procedure: BALLOON DILATION;  Surgeon: Missy Sabins, MD;  Location: [facility] ENDOSCOPY;  Service: Endoscopy;  Laterality: N/A;  . CHOLECYSTECTOMY  07/28/11  . ESOPHAGOGASTRODUODENOSCOPY  10/17/2011   Procedure: ESOPHAGOGASTRODUODENOSCOPY (EGD);  Surgeon: Missy Sabins, MD;  Location: [facility] ENDOSCOPY;  Service: Endoscopy;  Laterality: N/A;  . IR ANGIO INTRA EXTRACRAN SEL INTERNAL CAROTID BILAT MOD SED  04/07/2017  . IR ANGIO INTRA EXTRACRAN SEL INTERNAL CAROTID BILAT MOD SED  01/07/2018  . IR ANGIO VERTEBRAL SEL VERTEBRAL UNI R MOD SED  04/07/2017  . IR ANGIO VERTEBRAL SEL VERTEBRAL UNI R MOD SED  01/07/2018  . IR ANGIOGRAM FOLLOW UP STUDY  04/07/2017  . IR ANGIOGRAM FOLLOW UP STUDY  04/07/2017  . IR ANGIOGRAM FOLLOW UP STUDY  04/07/2017  . IR ANGIOGRAM FOLLOW UP STUDY  04/07/2017  . IR ANGIOGRAM FOLLOW UP STUDY  04/07/2017  . IR ANGIOGRAM SELECTIVE EACH ADDITIONAL VESSEL   04/07/2017  . IR TRANSCATH/EMBOLIZ  04/07/2017  . IR US GUIDE VASC ACCESS RIGHT  01/07/2018  . PARS PLANA VITRECTOMY Right 05/01/2017   Procedure: PARS PLANA VITRECTOMY WITH 25 GAUGE RIGHT EYE, endolaser photocoaglation;  Surgeon: Jalene Mullet, MD;  Location: [place];  Service: Ophthalmology;  Laterality: Right;  . RADIOLOGY WITH ANESTHESIA N/A 04/07/2017   Procedure: RADIOLOGY WITH ANESTHESIA;  Surgeon: Consuella Lose, MD;  Location: [place];  Service: Radiology;  Laterality: N/A;  . REPLACEMENT TOTAL KNEE BILATERAL  2004  . TEMPOROMANDIBULAR JOINT SURGERY     2 surgeries  . TUMOR REMOVAL    . VAGOTOMY  01/23/2012   Procedure: VAGOTOMY, antrectomy and BII;  Surgeon: Haywood Lasso, MD;  Location: [place];  Service: General;  Laterality: N/A;  Laparotomy with vagotomy.    There were no vitals filed for this visit.  Subjective Assessment - 04/29/18 1453    Subjective  Reports her leg is "killing her", and she is "crabby today". Has done the treadmill most every day since last visit, did not over the weekend. Tried to schedule with [facility] I[facility], they wanted the records from Dr. Joan Mayans so they did not schedule as they do not want to make him mad. No falls.  Patient is accompained by:  Family member    Limitations  Standing;Walking    Patient Stated Goals  To walk safely without walker, improve balance so no more falls.    Currently in Pain?  Yes    Pain Score  10-Worst pain ever    Pain Location  Leg groin into back of leg to knee/popliteal fossa    Pain Orientation  Right;Posterior;Proximal    Pain Descriptors / Indicators  Aching;Stabbing "tearing"    Pain Type  Chronic pain    Pain Onset  More than a month ago    Pain Frequency  Constant    Aggravating Factors   laying down and sitting    Pain Relieving Factors  standing and moving helps relieve the pain          [facility] Adult PT Treatment/Exercise - 04/29/18 1506      Ambulation/Gait   Ambulation/Gait  Yes     Ambulation/Gait Assistance  4: Min assist;4: Min guard    Ambulation/Gait Assistance Details  used Bioness with gait on left LE. cues/facilitation for posture, weight shifting, knee flexion and step placement.         Ambulation Distance (Feet)  115 Feet x2    Assistive device  Rolling walker;None    Gait Pattern  Step-through pattern;Decreased stance time - left;Decreased weight shift to left;Wide base of support;Abducted - left    Ambulation Surface  Level;Indoor      Self-Care   Self-Care  Other Self-Care Comments    Other Self-Care Comments   discussed progress/current plan of care and plan for when current plan of care runs out. refer to clinical impression statement  for more details.       Electrical Stimulation   Electrical Stimulation Location  left anterior tibialis, left hamstring     Electrical Stimulation Action  for improved knee flexion, knee stance control and improved DF with swing phase.     Electrical Stimulation Parameters  refer to table 1 for adjusted parameters. quick fit electrodes    Electrical Stimulation Goals  Neuromuscular facilitation;Strength          PT Short Term Goals - 04/30/18 1610      PT SHORT TERM GOAL #1   Title  Pt will improve DGI to >/= 17/24 without AD    Baseline  12/24 without AD    Status  Not Met      PT SHORT TERM GOAL #2   Title  Pt will decrease falls risk with community gait as indicated by increase in gait velocity without device except Bioness to > or = 2.6 ft/sec    Baseline  2.13 ft/sec without AD improved but not to goal    Status  Partially Met      PT SHORT TERM GOAL #3   Title  Pt will decrease falls risk as indicated by increase in BERG balance score to > or = 49/56    Baseline  04/15/18: 49/56 scored today    Status  Achieved      PT SHORT TERM GOAL #4   Title  Pt will ambulate >300' on indoor surfaces without AD with Bioness with supervision    Baseline  04/29/18: pt continues to need up to min assist with gait  without AD, distance limited as well by pain    Status  Not Met        PT Long Term Goals - 03/27/18 1955      PT LONG TERM GOAL #  1   Title  Pt and husband will demonstrate independence with HEP, including use of treadmill    Baseline  03/12/18: met with current program issued to date- corner balance, treadmill routine    Time  8    Period  Weeks    Status  On-going    Target Date  05/16/18      PT LONG TERM GOAL #2   Title  Pt will demonstrate improved balance and decreased falls risk as indicated by BERG balance score of > or = 52/56    Baseline  03/12/18: 46/56 - no change from last assessment    Time  8    Period  Weeks    Status  On-going    Target Date  05/16/18      PT LONG TERM GOAL #3   Title  Pt will ambulate without AD x 350' over outdoor paved surfaces with supervision; will negotiate 4 stairs with one rail with alternating sequence and supervision    Baseline  03/12/18: can negotiate steps with single rail reciprocally with supervision, continues to vary in assistance needed with gait without AD on level indoor surfaces- min to mod at times    Time  8    Period  Weeks    Status  Revised    Target Date  05/16/18      PT LONG TERM GOAL #4   Title  Pt will report improvement in Neuro QOL-LE to >/= 40%    Baseline  03/17/2018 Neuro QOL 34.3    Time  8    Period  Weeks    Status  On-going    Target Date  05/16/18      PT LONG TERM GOAL #5   Title  Pt will decrease falls risk during gait in home/community as indicated by increase in gait velocity to > or = 3.0 ft/sec    Baseline  03/14/18: 2.51 ft/sec with RW, increased from 2.4 ft/sec at last assessment    Time  8    Status  On-going    Target Date  05/16/18      PT LONG TERM GOAL #6   Title  Pt will demonstrate decreased falls risk with gait as indicated by increase in DGI score to >/= 19/24 without AD    Baseline  03/14/18: 14/24 today, decreased by 1 point from last assessment.     Time  8    Period  Weeks    Status   On-going    Target Date  05/16/18            Plan - 04/29/18 1458    Clinical Impression Statement  Today's skilled session continued with use of Bioness to left LE with activities. Pt did not meet remaining STG as she continues to need up to min assist with gait without AD. Limited today due to pt reporting 10/10 right hip/groin/leg pain. No change with session. Discussed current plan of care with pt/spouse/primary PT also present and that pt has 2 more weeks covered with current plan.  Added visists in for those weeks. Will determine whether to continue or go on hold after these 2 weeks depending on if the injection decreases her pain or not. Both pt and spouse verbalized understanding and agreement with this plan.     Rehab Potential  Good    PT Frequency  2x / week    PT Duration  8 weeks    PT Treatment/Interventions  ADLs/Self Care Home Management;Aquatic  Therapy;DME Instruction;Gait training;Stair training;Functional mobility training;Therapeutic activities;Therapeutic exercise;Balance training;Neuromuscular re-education;Patient/family education;Vestibular;Visual/perceptual remediation/compensation;Cognitive remediation;Orthotic Fit/Training;Electrical Stimulation    PT Next Visit Plan   discuss Home Bioness unit?  Check BP. continue with use of Bioness to left LE: gait on treamill both single and double LE's, LLE on treadmill moving belt but without belt on; tall and half kneeling, use of theraband or sports cord to force weight shift to L, weight shifting on rockerboard.  Corner balance and finding midline    Consulted and Agree with Plan of Care  Patient;Family member/caregiver    Family Member Consulted  spouse       Patient will benefit from skilled therapeutic intervention in order to improve the following deficits and impairments:  Abnormal gait, Decreased balance, Decreased cognition, Decreased coordination, Decreased strength, Difficulty walking, Dizziness, Impaired sensation,  Impaired vision/preception, Pain, Decreased activity tolerance, Decreased mobility, Decreased endurance, Postural dysfunction  Visit Diagnosis: Other abnormalities of gait and mobility  Unsteadiness on feet  Dizziness and giddiness  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 Weakness with dizziness-  since [facility] and CVA 04/07/17 08/07/2017  . Disturbances of vision, late effect of stroke 08/06/2017  . Health education/counseling 08/05/2017  . High risk medications (not anticoagulants) long-term use 08/05/2017  . Neuritis-  R sided:  arm and leg/ body due to stroke 08/05/2017  . Elevated LDL cholesterol level 07/11/2017  . [employer] (NIH) Stroke Scale limb ataxia score 2, ataxia present in two limbs 06/25/2017  . 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 Elevated vitamin B12 level 05/30/2017  . Vitamin D deficiency 05/29/2017  . History of tobacco abuse-  30pk yr hx - quit 04/07/17 05/21/2017  . Gait disturbance, post-stroke 05/14/2017  . Benign essential HTN   . Vitreous hemorrhage of right eye ([place])   . Adjustment disorder with mixed anxiety and depressed mood   . 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 s/p SAH (subarachnoid hemorrhage) ([place]) 04/19/2017  . 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 Hypoxia   . Subarachnoid hemorrhage due to ruptured aneurysm ([place]) 04/07/2017  . CVA (cerebral vascular accident) ([place]) 04/07/2017  . Elevated gastrin level 01/25/2012  . Hypokalemia 01/21/2012  . Nausea & vomiting 01/20/2012  . Epigastric pain 01/20/2012  . Duodenal ulcer, acute with obstruction 10/17/2011  . S/P laparoscopic cholecystectomy 10/16/2011    Willow Ora, PTA,  [facility] [address], [place], [place] [address] [phone] 04/30/18, 5:11 PM   Name: [name] Barnett MRN: [id number] Date of Birth: Apr 14, 1976

## 2018-05-01 ENCOUNTER — Ambulatory Visit: Payer: BLUE CROSS/BLUE SHIELD | Admitting: Physical Therapy

## 2018-05-01 DIAGNOSIS — R2689 Other abnormalities of gait and mobility: Secondary | ICD-10-CM

## 2018-05-01 DIAGNOSIS — R260 Ataxic gait: Secondary | ICD-10-CM

## 2018-05-01 DIAGNOSIS — R2681 Unsteadiness on feet: Secondary | ICD-10-CM

## 2018-05-01 DIAGNOSIS — R278 Other lack of coordination: Secondary | ICD-10-CM

## 2018-05-02 ENCOUNTER — Encounter: Payer: Self-pay | Admitting: Physical Therapy

## 2018-05-02 NOTE — Therapy (Signed)
[facility] [address] [place], [place], [address] Phone: [phone]   Fax:  [phone]  Physical Therapy Treatment  Patient Details  Name: [name] Barnett MRN: [id number] Date of Birth: 09/06/1976 Referring Provider: Andrew E Kirsteins, MD   Encounter Date: 05/01/2018  PT End of Session - 05/02/18 1204    Visit Number  68    Number of Visits  71    Date for PT Re-Evaluation  05/16/18 date corrected to reflect 60 day cert    Authorization Type  BCBS    PT Start Time  1530    PT Stop Time  1615    PT Time Calculation (min)  45 min    Activity Tolerance  Patient limited by pain    Behavior During Therapy  WFL for tasks assessed/performed       Past Medical History:  Diagnosis Date  . Duodenal obstruction   . GERD (gastroesophageal reflux disease)   . Hypertension   . Stroke ([name])    april 2018    Past Surgical History:  Procedure Laterality Date  . ABDOMINAL HYSTERECTOMY    . BALLOON DILATION  12/31/2011   Procedure: BALLOON DILATION;  Surgeon: [name], MD;  Location: [facility] ENDOSCOPY;  Service: Endoscopy;  Laterality: N/A;  . CHOLECYSTECTOMY  07/28/11  . ESOPHAGOGASTRODUODENOSCOPY  10/17/2011   Procedure: ESOPHAGOGASTRODUODENOSCOPY (EGD);  Surgeon: [name], MD;  Location: [facility] ENDOSCOPY;  Service: Endoscopy;  Laterality: N/A;  . IR ANGIO INTRA EXTRACRAN SEL INTERNAL CAROTID BILAT MOD SED  04/07/2017  . IR ANGIO INTRA EXTRACRAN SEL INTERNAL CAROTID BILAT MOD SED  01/07/2018  . IR ANGIO VERTEBRAL SEL VERTEBRAL UNI R MOD SED  04/07/2017  . IR ANGIO VERTEBRAL SEL VERTEBRAL UNI R MOD SED  01/07/2018  . IR ANGIOGRAM FOLLOW UP STUDY  04/07/2017  . IR ANGIOGRAM FOLLOW UP STUDY  04/07/2017  . IR ANGIOGRAM FOLLOW UP STUDY  04/07/2017  . IR ANGIOGRAM FOLLOW UP STUDY  04/07/2017  . IR ANGIOGRAM FOLLOW UP STUDY  04/07/2017  . IR ANGIOGRAM SELECTIVE EACH ADDITIONAL VESSEL  04/07/2017  . IR TRANSCATH/EMBOLIZ  04/07/2017  . IR  US GUIDE VASC ACCESS RIGHT  01/07/2018  . PARS PLANA VITRECTOMY Right 05/01/2017   Procedure: PARS PLANA VITRECTOMY WITH 25 GAUGE RIGHT EYE, endolaser photocoaglation;  Surgeon: Patel, Narendra, MD;  Location: [facility] OR;  Service: Ophthalmology;  Laterality: Right;  . RADIOLOGY WITH ANESTHESIA N/A 04/07/2017   Procedure: RADIOLOGY WITH ANESTHESIA;  Surgeon: Neelesh Nundkumar, MD;  Location: [facility] OR;  Service: Radiology;  Laterality: N/A;  . REPLACEMENT TOTAL KNEE BILATERAL  2004  . TEMPOROMANDIBULAR JOINT SURGERY     2 surgeries  . TUMOR REMOVAL    . VAGOTOMY  01/23/2012   Procedure: VAGOTOMY, antrectomy and BII;  Surgeon: Christian J Streck, MD;  Location: [facility] OR;  Service: General;  Laterality: N/A;  Laparotomy with vagotomy.    There were no vitals filed for this visit.  Subjective Assessment - 05/02/18 1156    Subjective  RLE feels about the same as it did the other day.  No other issues to report    Patient is accompained by:  Family member    Limitations  Standing;Walking    Patient Stated Goals  To walk safely without walker, improve balance so no more falls.    Currently in Pain?  Yes    Pain Score  9     Pain Location  Groin    Pain   Orientation  Right    Pain Descriptors / Indicators  Stabbing    Pain Type  Chronic pain    Pain Onset  More than a month ago                       [facility] Adult PT Treatment/Exercise - 05/02/18 1157      Ambulation/Gait   Ambulation/Gait  Yes    Ambulation/Gait Assistance  4: Min assist    Ambulation/Gait Assistance Details  with Bioness in gait mode performed 4 laps around gym without AD but with therapist providing manual facilitation at L shoulder/trunk and pelvis and verbal cues for controlled forwards weight shift, full L lateral weight shift to L side, increased stance time LLE to allow full step length.  Also provided cues for more narrow BOS by bringing LLE back under COG    Ambulation Distance (Feet)  460 Feet    Assistive device   None    Gait Pattern  Step-through pattern;Decreased step length - right;Decreased stance time - left;Decreased stride length;Decreased weight shift to left;Ataxic;Wide base of support;Abducted - left    Ambulation Surface  Level;Indoor      Modalities   Modalities  Teacher, English as a foreign language Location  left anterior tibialis, left hamstring     Electrical Stimulation Action  coordination and motor planning L anterior tibialis and hamstring mm    Electrical Stimulation Parameters  saved parameters in Tablet 1, quick fit electrodes    Electrical Stimulation Goals  Neuromuscular facilitation          Balance Exercises - 05/02/18 1159      Balance Exercises: Standing   Rockerboard  Anterior/posterior;EO;10 reps;UE support    Other Standing Exercises  with RUE support on counter top and L foot on rockerboard, Bioness on training mode performed multiple RLE step overs and back with focus on full weight shift to L, full stance time on LLE and activation on LLE; required mod A to sequence          PT Short Term Goals - 04/30/18 1610      PT SHORT TERM GOAL #1   Title  Pt will improve DGI to >/= 17/24 without AD    Baseline  12/24 without AD    Status  Not Met      PT SHORT TERM GOAL #2   Title  Pt will decrease falls risk with community gait as indicated by increase in gait velocity without device except Bioness to > or = 2.6 ft/sec    Baseline  2.13 ft/sec without AD improved but not to goal    Status  Partially Met      PT SHORT TERM GOAL #3   Title  Pt will decrease falls risk as indicated by increase in BERG balance score to > or = 49/56    Baseline  04/15/18: 49/56 scored today    Status  Achieved      PT SHORT TERM GOAL #4   Title  Pt will ambulate >300' on indoor surfaces without AD with Bioness with supervision    Baseline  04/29/18: pt continues to need up to min assist with gait without AD, distance limited as well by  pain    Status  Not Met        PT Long Term Goals - 03/27/18 1955      PT LONG TERM GOAL #1   Title  Pt and husband will demonstrate independence with HEP, including use of treadmill    Baseline  03/12/18: met with current program issued to date- corner balance, treadmill routine    Time  8    Period  Weeks    Status  On-going    Target Date  05/16/18      PT LONG TERM GOAL #2   Title  Pt will demonstrate improved balance and decreased falls risk as indicated by BERG balance score of > or = 52/56    Baseline  03/12/18: 46/56 - no change from last assessment    Time  8    Period  Weeks    Status  On-going    Target Date  05/16/18      PT LONG TERM GOAL #3   Title  Pt will ambulate without AD x 350' over outdoor paved surfaces with supervision; will negotiate 4 stairs with one rail with alternating sequence and supervision    Baseline  03/12/18: can negotiate steps with single rail reciprocally with supervision, continues to vary in assistance needed with gait without AD on level indoor surfaces- min to mod at times    Time  8    Period  Weeks    Status  Revised    Target Date  05/16/18      PT LONG TERM GOAL #4   Title  Pt will report improvement in Neuro QOL-LE to >/= 40%    Baseline  03/17/2018 Neuro QOL 34.3    Time  8    Period  Weeks    Status  On-going    Target Date  05/16/18      PT LONG TERM GOAL #5   Title  Pt will decrease falls risk during gait in home/community as indicated by increase in gait velocity to > or = 3.0 ft/sec    Baseline  03/14/18: 2.51 ft/sec with RW, increased from 2.4 ft/sec at last assessment    Time  8    Status  On-going    Target Date  05/16/18      PT LONG TERM GOAL #6   Title  Pt will demonstrate decreased falls risk with gait as indicated by increase in DGI score to >/= 19/24 without AD    Baseline  03/14/18: 14/24 today, decreased by 1 point from last assessment.     Time  8    Period  Weeks    Status  On-going    Target Date  05/16/18             Plan - 05/02/18 1205    Clinical Impression Statement  Treatment session today focused on NMR, pre-gait and gait training with use of functional electrical stimulation.  Pt continues to require min A to perform safely without use of AD and as pt's LLE fatigues she demonstrates decreased stability, increased L foot drag and decreased postural control.  Reported some increase in R hip pain during rockerboard activity but no change in R hip pain at end of session.      Rehab Potential  Good    PT Frequency  2x / week    PT Duration  8 weeks    PT Treatment/Interventions  ADLs/Self Care Home Management;Aquatic Therapy;DME Instruction;Gait training;Stair training;Functional mobility training;Therapeutic activities;Therapeutic exercise;Balance training;Neuromuscular re-education;Patient/family education;Vestibular;Visual/perceptual remediation/compensation;Cognitive remediation;Orthotic Fit/Training;Electrical Stimulation    PT Next Visit Plan   discuss Home Bioness unit?  tall kneeling weight shifting with therapy ball.  continue with use of Bioness to  left LE: gait on treamill both single and double LE's, LLE on treadmill moving belt but without belt on; tall and half kneeling, use of theraband or sports cord to force weight shift to L, weight shifting on rockerboard.  Corner balance and finding midline    Consulted and Agree with Plan of Care  Patient;Family member/caregiver    Family Member Consulted  spouse       Patient will benefit from skilled therapeutic intervention in order to improve the following deficits and impairments:  Abnormal gait, Decreased balance, Decreased cognition, Decreased coordination, Decreased strength, Difficulty walking, Dizziness, Impaired sensation, Impaired vision/preception, Pain, Decreased activity tolerance, Decreased mobility, Decreased endurance, Postural dysfunction  Visit Diagnosis: Ataxic gait  Other lack of coordination  Unsteadiness on  feet  Other abnormalities of gait and mobility     Problem List Patient Active Problem List   Diagnosis Date Noted  . 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 Weakness with dizziness-  since [facility] and CVA 04/07/17 08/07/2017  . Disturbances of vision, late effect of stroke 08/06/2017  . Health education/counseling 08/05/2017  . High risk medications (not anticoagulants) long-term use 08/05/2017  . Neuritis-  R sided:  arm and leg/ body due to stroke 08/05/2017  . Elevated LDL cholesterol level 07/11/2017  . [employer] (NIH) Stroke Scale limb ataxia score 2, ataxia present in two limbs 06/25/2017  . 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 Elevated vitamin B12 level 05/30/2017  . Vitamin D deficiency 05/29/2017  . History of tobacco abuse-  30pk yr hx - quit 04/07/17 05/21/2017  . Gait disturbance, post-stroke 05/14/2017  . Benign essential HTN   . Vitreous hemorrhage of right eye ([place])   . Adjustment disorder with mixed anxiety and depressed mood   . 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 s/p SAH (subarachnoid hemorrhage) ([place]) 04/19/2017  . 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 Hypoxia   . Subarachnoid hemorrhage due to ruptured aneurysm ([place]) 04/07/2017  . CVA (cerebral vascular accident) ([place]) 04/07/2017  . Elevated gastrin level 01/25/2012  . Hypokalemia 01/21/2012  . Nausea & vomiting 01/20/2012  . Epigastric pain 01/20/2012  . Duodenal ulcer, acute with obstruction 10/17/2011  . S/P laparoscopic cholecystectomy 10/16/2011    Rico Junker, PT, DPT 05/02/18    12:16 PM    [place] [address] [place], [place], [address] Phone: [phone]   Fax:  [phone]  Name: [name] Barnett MRN: [id number] Date of Birth: 11/15/1976

## 2018-05-06 ENCOUNTER — Ambulatory Visit: Payer: BLUE CROSS/BLUE SHIELD | Admitting: Physical Therapy

## 2018-05-09 ENCOUNTER — Ambulatory Visit: Payer: BLUE CROSS/BLUE SHIELD | Admitting: Physical Therapy

## 2018-05-09 ENCOUNTER — Encounter: Payer: Self-pay | Admitting: Physical Therapy

## 2018-05-09 VITALS — BP 152/95 | HR 74

## 2018-05-09 DIAGNOSIS — R260 Ataxic gait: Secondary | ICD-10-CM | POA: Diagnosis not present

## 2018-05-09 DIAGNOSIS — R2689 Other abnormalities of gait and mobility: Secondary | ICD-10-CM

## 2018-05-09 DIAGNOSIS — R278 Other lack of coordination: Secondary | ICD-10-CM

## 2018-05-09 DIAGNOSIS — R2681 Unsteadiness on feet: Secondary | ICD-10-CM

## 2018-05-09 NOTE — Therapy (Signed)
[facility] [address] [place], [place], [address] Phone: [phone]   Fax:  [phone]  Physical Therapy Treatment  Patient Details  Name: [name] Barnett MRN: [id number] Date of Birth: 06-15-1976 Referring Provider: Charlett Blake, MD   Encounter Date: 05/09/2018  PT End of Session - 05/09/18 1415    Visit Number  71    Number of Visits  16    Date for PT Re-Evaluation  05/16/18 date corrected to reflect 67 day cert    Authorization Type  [employer]    PT Start Time  1317    PT Stop Time  1404    PT Time Calculation (min)  47 min    Activity Tolerance  Patient tolerated treatment well    Behavior During Therapy  [facility] for tasks assessed/performed       Past Medical History:  Diagnosis Date  . Duodenal obstruction   . GERD (gastroesophageal reflux disease)   . Hypertension   . Stroke [facility])    april 2018    Past Surgical History:  Procedure Laterality Date  . ABDOMINAL HYSTERECTOMY    . BALLOON DILATION  12/31/2011   Procedure: BALLOON DILATION;  Surgeon: Missy Sabins, MD;  Location: [facility] ENDOSCOPY;  Service: Endoscopy;  Laterality: N/A;  . CHOLECYSTECTOMY  07/28/11  . ESOPHAGOGASTRODUODENOSCOPY  10/17/2011   Procedure: ESOPHAGOGASTRODUODENOSCOPY (EGD);  Surgeon: Missy Sabins, MD;  Location: [facility] ENDOSCOPY;  Service: Endoscopy;  Laterality: N/A;  . IR ANGIO INTRA EXTRACRAN SEL INTERNAL CAROTID BILAT MOD SED  04/07/2017  . IR ANGIO INTRA EXTRACRAN SEL INTERNAL CAROTID BILAT MOD SED  01/07/2018  . IR ANGIO VERTEBRAL SEL VERTEBRAL UNI R MOD SED  04/07/2017  . IR ANGIO VERTEBRAL SEL VERTEBRAL UNI R MOD SED  01/07/2018  . IR ANGIOGRAM FOLLOW UP STUDY  04/07/2017  . IR ANGIOGRAM FOLLOW UP STUDY  04/07/2017  . IR ANGIOGRAM FOLLOW UP STUDY  04/07/2017  . IR ANGIOGRAM FOLLOW UP STUDY  04/07/2017  . IR ANGIOGRAM FOLLOW UP STUDY  04/07/2017  . IR ANGIOGRAM SELECTIVE EACH ADDITIONAL VESSEL  04/07/2017  . IR TRANSCATH/EMBOLIZ   04/07/2017  . IR US GUIDE VASC ACCESS RIGHT  01/07/2018  . PARS PLANA VITRECTOMY Right 05/01/2017   Procedure: PARS PLANA VITRECTOMY WITH 25 GAUGE RIGHT EYE, endolaser photocoaglation;  Surgeon: Jalene Mullet, MD;  Location: [facility];  Service: Ophthalmology;  Laterality: Right;  . RADIOLOGY WITH ANESTHESIA N/A 04/07/2017   Procedure: RADIOLOGY WITH ANESTHESIA;  Surgeon: Consuella Lose, MD;  Location: [facility];  Service: Radiology;  Laterality: N/A;  . REPLACEMENT TOTAL KNEE BILATERAL  2004  . TEMPOROMANDIBULAR JOINT SURGERY     2 surgeries  . TUMOR REMOVAL    . VAGOTOMY  01/23/2012   Procedure: VAGOTOMY, antrectomy and BII;  Surgeon: Haywood Lasso, MD;  Location: [facility];  Service: General;  Laterality: N/A;  Laparotomy with vagotomy.    Vitals:   05/09/18 1323  BP: (!) 152/95  Pulse: 74    Subjective Assessment - 05/09/18 1323    Subjective  Stomach has been hurting a lot for the past few days, increased diarrhea.  Did not eat breakfast or lunch today.     Patient is accompained by:  Family member    Limitations  Standing;Walking    Patient Stated Goals  To walk safely without walker, improve balance so no more falls.    Currently in Pain?  Yes    Pain Score  4  Pain Location  Hip    Pain Orientation  Right    Pain Descriptors / Indicators  Stabbing    Pain Onset  More than a month ago                       [facility] Adult PT Treatment/Exercise - 05/09/18 1407      Ambulation/Gait   Ambulation/Gait  Yes    Ambulation/Gait Assistance  4: Min assist    Ambulation/Gait Assistance Details  gait without AD but with therapist stabilizing shoulders to prevent over rotation with shoulders and improve weight shifting to L to facilitate increased stance time on L, increased step length on R    Ambulation Distance (Feet)  50 Feet    Assistive device  None    Gait Pattern  Step-through pattern;Decreased step length - right;Decreased stance time - left;Decreased stride  length;Decreased weight shift to left;Ataxic    Ambulation Surface  Level;Indoor      Neuro Re-ed    Neuro Re-ed Details   Performed NMR for WB and lateral weight shifting to L in quadruped on mat while performing: forwards and backwards rocking in midline and then with R/L bias with increased assistance to fully shift to L; alternating arm raises forwards and then to the side with trunk rotation; added in contralateral UE and LE raises with pt requiring mod A to maintain trunk control and weight shift to L when raising RLE and LUE - transitioned to just extended RLE with toes on mat > lifting RLE off mat and then adding UE raise back in.  Performed hip ABD with knee bent in quadruped for lateral hip strengthening.  Transitioned to tall kneeling with LUE on therapy ball performing active weight shifting from pelvis laterally to L and then forwards and L at a diagonal; greatest difficulty shifting forwards and L - performed ball bumps with pelvis.  Transitioned to standing in // bars with sports cord around pelvis; resistance given on R side pulling R to facilitate pt shifting and maintaining weight over LLE while lifting UE and then lifting RLE.                 PT Short Term Goals - 04/30/18 1610      PT SHORT TERM GOAL #1   Title  Pt will improve DGI to >/= 17/24 without AD    Baseline  12/24 without AD    Status  Not Met      PT SHORT TERM GOAL #2   Title  Pt will decrease falls risk with community gait as indicated by increase in gait velocity without device except Bioness to > or = 2.6 ft/sec    Baseline  2.13 ft/sec without AD improved but not to goal    Status  Partially Met      PT SHORT TERM GOAL #3   Title  Pt will decrease falls risk as indicated by increase in BERG balance score to > or = 49/56    Baseline  04/15/18: 49/56 scored today    Status  Achieved      PT SHORT TERM GOAL #4   Title  Pt will ambulate >300' on indoor surfaces without AD with Bioness with supervision     Baseline  04/29/18: pt continues to need up to min assist with gait without AD, distance limited as well by pain    Status  Not Met        PT Long Term Goals -  03/27/18 1955      PT LONG TERM GOAL #1   Title  Pt and husband will demonstrate independence with HEP, including use of treadmill    Baseline  03/12/18: met with current program issued to date- corner balance, treadmill routine    Time  8    Period  Weeks    Status  On-going    Target Date  05/16/18      PT LONG TERM GOAL #2   Title  Pt will demonstrate improved balance and decreased falls risk as indicated by BERG balance score of > or = 52/56    Baseline  03/12/18: 46/56 - no change from last assessment    Time  8    Period  Weeks    Status  On-going    Target Date  05/16/18      PT LONG TERM GOAL #3   Title  Pt will ambulate without AD x 350' over outdoor paved surfaces with supervision; will negotiate 4 stairs with one rail with alternating sequence and supervision    Baseline  03/12/18: can negotiate steps with single rail reciprocally with supervision, continues to vary in assistance needed with gait without AD on level indoor surfaces- min to mod at times    Time  8    Period  Weeks    Status  Revised    Target Date  05/16/18      PT LONG TERM GOAL #4   Title  Pt will report improvement in Neuro QOL-LE to >/= 40%    Baseline  03/17/2018 Neuro QOL 34.3    Time  8    Period  Weeks    Status  On-going    Target Date  05/16/18      PT LONG TERM GOAL #5   Title  Pt will decrease falls risk during gait in home/community as indicated by increase in gait velocity to > or = 3.0 ft/sec    Baseline  03/14/18: 2.51 ft/sec with RW, increased from 2.4 ft/sec at last assessment    Time  8    Status  On-going    Target Date  05/16/18      PT LONG TERM GOAL #6   Title  Pt will demonstrate decreased falls risk with gait as indicated by increase in DGI score to >/= 19/24 without AD    Baseline  03/14/18: 14/24 today, decreased by 1  point from last assessment.     Time  8    Period  Weeks    Status  On-going    Target Date  05/16/18            Plan - 05/09/18 1415    Clinical Impression Statement  Treatment session today focused solely on NMR for trunk/postural control, weight shifting to L and increased proprioception on L side in quadruped, tall kneeling and standing.  Following NMR pt demonstrated improved step length and control of stepping sequence during gait at end of session.  Will continue to assess progress following injection to R hip and determine if pt would benefit from ongoing therapy or will need to hold to have R hip pain further investigated.    Rehab Potential  Good    PT Frequency  2x / week    PT Duration  8 weeks    PT Treatment/Interventions  ADLs/Self Care Home Management;Aquatic Therapy;DME Instruction;Gait training;Stair training;Functional mobility training;Therapeutic activities;Therapeutic exercise;Balance training;Neuromuscular re-education;Patient/family education;Vestibular;Visual/perceptual remediation/compensation;Cognitive remediation;Orthotic Fit/Training;Electrical Stimulation    PT Next Visit  Plan  WB/proprioception/weight shifting L side: quadruped, tall kneeling, single leg sit > stand, standing in // bars with sports cord pulling R so pt weight shifts to L.  Bioness during gait and stair negotiation without AD, Bioness with treadmill    Consulted and Agree with Plan of Care  Patient;Family member/caregiver    Family Member Consulted  spouse       Patient will benefit from skilled therapeutic intervention in order to improve the following deficits and impairments:  Abnormal gait, Decreased balance, Decreased cognition, Decreased coordination, Decreased strength, Difficulty walking, Dizziness, Impaired sensation, Impaired vision/preception, Pain, Decreased activity tolerance, Decreased mobility, Decreased endurance, Postural dysfunction  Visit Diagnosis: Ataxic gait  Other  lack of coordination  Unsteadiness on feet  Other abnormalities of gait and mobility     Problem List Patient Active Problem List   Diagnosis Date Noted  . 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 Weakness with dizziness-  since [facility] and CVA 04/07/17 08/07/2017  . Disturbances of vision, late effect of stroke 08/06/2017  . Health education/counseling 08/05/2017  . High risk medications (not anticoagulants) long-term use 08/05/2017  . Neuritis-  R sided:  arm and leg/ body due to stroke 08/05/2017  . Elevated LDL cholesterol level 07/11/2017  . [employer] (NIH) Stroke Scale limb ataxia score 2, ataxia present in two limbs 06/25/2017  . 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 Elevated vitamin B12 level 05/30/2017  . Vitamin D deficiency 05/29/2017  . History of tobacco abuse-  30pk yr hx - quit 04/07/17 05/21/2017  . Gait disturbance, post-stroke 05/14/2017  . Benign essential HTN   . Vitreous hemorrhage of right eye ([place])   . Adjustment disorder with mixed anxiety and depressed mood   . 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 s/p SAH (subarachnoid hemorrhage) ([place]) 04/19/2017  . 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 Hypoxia   . Subarachnoid hemorrhage due to ruptured aneurysm ([place]) 04/07/2017  . CVA (cerebral vascular accident) ([place]) 04/07/2017  . Elevated gastrin level 01/25/2012  . Hypokalemia 01/21/2012  . Nausea & vomiting 01/20/2012  . Epigastric pain 01/20/2012  . Duodenal ulcer, acute with obstruction 10/17/2011  . S/P laparoscopic cholecystectomy 10/16/2011    Rico Junker, PT, DPT 05/09/18    2:20 PM    [facility] [address] [place], [place], [address] Phone: [phone]   Fax:  [phone]  Name: [name] Barnett MRN: [id number] Date of Birth: 1976/06/13

## 2018-05-12 ENCOUNTER — Encounter: Payer: Self-pay | Admitting: Physical Medicine & Rehabilitation

## 2018-05-12 ENCOUNTER — Encounter: Payer: BLUE CROSS/BLUE SHIELD | Attending: Physical Medicine & Rehabilitation

## 2018-05-12 ENCOUNTER — Ambulatory Visit: Payer: BLUE CROSS/BLUE SHIELD | Admitting: Physical Medicine & Rehabilitation

## 2018-05-12 ENCOUNTER — Ambulatory Visit: Payer: Self-pay | Admitting: Physical Therapy

## 2018-05-12 VITALS — BP 116/75 | HR 74 | Resp 16 | Ht 64.0 in | Wt 167.0 lb

## 2018-05-12 DIAGNOSIS — I725 Aneurysm of other precerebral arteries: Secondary | ICD-10-CM | POA: Diagnosis present

## 2018-05-12 DIAGNOSIS — M1611 Unilateral primary osteoarthritis, right hip: Secondary | ICD-10-CM

## 2018-05-12 DIAGNOSIS — R42 Dizziness and giddiness: Secondary | ICD-10-CM | POA: Insufficient documentation

## 2018-05-12 DIAGNOSIS — I69093 Ataxia following nontraumatic subarachnoid hemorrhage: Secondary | ICD-10-CM | POA: Insufficient documentation

## 2018-05-12 NOTE — Procedures (Signed)
hip intra-articular injection under fluoro guidance  Moderate Right hip osteoarthritis with pain that limits mobility,, unresponsive to medication management.  Informed consent was obtained after describing risks and benefits of the procedure with patient . Patient has given written consent  Patient placed supine on exam table. Fluoro spot images obtained The femoral head was identified as well as this femoral neck. Area was marked then prepped with Betadine, sterile drape  A 25-gauge 1.5 inch needle was used to anesthetize the skin and subcutaneous tissue with 3 cc of 1% lidocaine under direct ultrasound visualization. Then a 22-gauge 3.5 in Quincke needle was inserted under direct ultrasound visualization targeting the junction of the femoral neck and the femoral head. Once bone contact was made Isovue 200 x 2 ml were injected demonstrating good capsular speard. THen a solution containing 1 cc of 6 mg/cc betamethasone and 4 cc of 1% lidocaine was  injected  Patient tolerated procedure well Images saved 

## 2018-05-12 NOTE — Patient Instructions (Signed)
You may get partial relief

## 2018-05-12 NOTE — Progress Notes (Signed)
  PROCEDURE RECORD [facility]   Name: [name] Barnett DOB:01/05/1976 MRN: [id number]  Date:05/12/2018  Physician: Claudette LawsAndrew Kirsteins, MD    Nurse/CMA: Apolonio Cutting, CMA   Allergies:  Allergies  Allergen Reactions  . 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    Is patient diabetic? No.  CBG today?   Pregnant: No. LMP: No LMP recorded. (age [age])  Anticoagulants: no Anti-inflammatory: no Antibiotics: no  Procedure: intra-articular hip injection  Position: Prone Start Time: 4:26pm  End Time: 4:37pm  Fluoro Time: 14  RN/CMA Octavian Godek,CMA Savalas Monje, CMA    Time 3:45pm 4:20pm    BP 116/75 145/82    Pulse 74 78    Respirations 14 14    O2 Sat 95 98    S/S 6 6    Pain Level 5/10 0/10     D/C home with husband, patient A & O X 3, D/C instructions reviewed, and sits independently.

## 2018-05-13 ENCOUNTER — Ambulatory Visit: Payer: BLUE CROSS/BLUE SHIELD | Attending: Physical Medicine & Rehabilitation | Admitting: Physical Therapy

## 2018-05-13 ENCOUNTER — Encounter: Payer: Self-pay | Admitting: Physical Therapy

## 2018-05-13 DIAGNOSIS — R278 Other lack of coordination: Secondary | ICD-10-CM | POA: Diagnosis present

## 2018-05-13 DIAGNOSIS — R2689 Other abnormalities of gait and mobility: Secondary | ICD-10-CM | POA: Diagnosis present

## 2018-05-13 DIAGNOSIS — R260 Ataxic gait: Secondary | ICD-10-CM | POA: Diagnosis not present

## 2018-05-13 DIAGNOSIS — R2681 Unsteadiness on feet: Secondary | ICD-10-CM | POA: Insufficient documentation

## 2018-05-13 DIAGNOSIS — R42 Dizziness and giddiness: Secondary | ICD-10-CM | POA: Diagnosis present

## 2018-05-15 NOTE — Therapy (Signed)
[facility] [address] [place], [place], [address] Phone: [phone]   Fax:  [phone]  Physical Therapy Treatment  Patient Details  Name: [name] Barnett MRN: [id number] Date of Birth: [birth year] Referring Provider: Charlett Blake, MD   Encounter Date: 05/13/2018     05/13/18 1418  PT Visits / Re-Eval  Visit Number [id number]  Number of Visits 82  Date for PT Re-Evaluation 05/16/18 (date corrected to reflect 87 day cert)  Authorization  Authorization Type [employer]  PT Time Calculation  PT Start Time 1404  PT Stop Time 1445  PT Time Calculation (min) 41 min  PT - End of Session  Equipment Utilized During Treatment Gait belt  Activity Tolerance Patient tolerated treatment well  Behavior During Therapy [facility] for tasks assessed/performed    Past Medical History:  Diagnosis Date  . Duodenal obstruction   . GERD (gastroesophageal reflux disease)   . Hypertension   . Stroke [facility])    april 2018    Past Surgical History:  Procedure Laterality Date  . ABDOMINAL HYSTERECTOMY    . BALLOON DILATION  12/31/2011   Procedure: BALLOON DILATION;  Surgeon: Missy Sabins, MD;  Location: [facility] ENDOSCOPY;  Service: Endoscopy;  Laterality: N/A;  . CHOLECYSTECTOMY  07/28/11  . ESOPHAGOGASTRODUODENOSCOPY  10/17/2011   Procedure: ESOPHAGOGASTRODUODENOSCOPY (EGD);  Surgeon: Missy Sabins, MD;  Location: [facility] ENDOSCOPY;  Service: Endoscopy;  Laterality: N/A;  . IR ANGIO INTRA EXTRACRAN SEL INTERNAL CAROTID BILAT MOD SED  04/07/2017  . IR ANGIO INTRA EXTRACRAN SEL INTERNAL CAROTID BILAT MOD SED  01/07/2018  . IR ANGIO VERTEBRAL SEL VERTEBRAL UNI R MOD SED  04/07/2017  . IR ANGIO VERTEBRAL SEL VERTEBRAL UNI R MOD SED  01/07/2018  . IR ANGIOGRAM FOLLOW UP STUDY  04/07/2017  . IR ANGIOGRAM FOLLOW UP STUDY  04/07/2017  . IR ANGIOGRAM FOLLOW UP STUDY  04/07/2017  . IR ANGIOGRAM FOLLOW UP STUDY  04/07/2017  . IR ANGIOGRAM FOLLOW UP STUDY  04/07/2017  . IR  ANGIOGRAM SELECTIVE EACH ADDITIONAL VESSEL  04/07/2017  . IR TRANSCATH/EMBOLIZ  04/07/2017  . IR US GUIDE VASC ACCESS RIGHT  01/07/2018  . PARS PLANA VITRECTOMY Right 05/01/2017   Procedure: PARS PLANA VITRECTOMY WITH 25 GAUGE RIGHT EYE, endolaser photocoaglation;  Surgeon: Jalene Mullet, MD;  Location: [facility];  Service: Ophthalmology;  Laterality: Right;  . RADIOLOGY WITH ANESTHESIA N/A 04/07/2017   Procedure: RADIOLOGY WITH ANESTHESIA;  Surgeon: Consuella Lose, MD;  Location: [facility];  Service: Radiology;  Laterality: N/A;  . REPLACEMENT TOTAL KNEE BILATERAL  2004  . TEMPOROMANDIBULAR JOINT SURGERY     2 surgeries  . TUMOR REMOVAL    . VAGOTOMY  01/23/2012   Procedure: VAGOTOMY, antrectomy and BII;  Surgeon: Haywood Lasso, MD;  Location: [facility];  Service: General;  Laterality: N/A;  Laparotomy with vagotomy.    There were no vitals filed for this visit.     05/13/18 1404  Symptoms/Limitations  Subjective Had the injection yesterday. Reports slight decrease in pain today, however states she was told it can take up to 48 hours to get the maximal relief. Has concerns that the pain relief is temporary and reports she expressed this to the doctor yesterday. Along with the plan of PT to hold if hip pain does not improve for pt to have further work up. She said the doctor stated he would address that, just did not say how he would address it. She also reports she  vacumned over the weekend with [name] right behind her. He never held her, was just guarding.  Patient is accompained by: Family member  Limitations Standing;Walking  Patient Stated Goals To walk safely without walker, improve balance so no more falls.  Pain Assessment  Currently in Pain? Yes  Pain Score 1  Pain Location Hip (into thigh and groin)  Pain Orientation Right  Pain Descriptors / Indicators Aching;Stabbing  Pain Type Chronic pain  Pain Radiating Towards radiates into thigh and groin (where most of the pain is)  Pain Onset  More than a month ago  Pain Frequency Constant  Aggravating Factors  stretching  Pain Relieving Factors standing and movements      05/13/18 1427  Ambulation/Gait  Ambulation/Gait Yes  Ambulation/Gait Assistance 4: Min assist;4: Min guard  Ambulation/Gait Assistance Details PTA providing resistance via gait belt at pelvis with 2cd person as standby assist in front of pt for gait without AD. pt with more fluid gait pattern, decr ataxia this way.   Ambulation Distance (Feet) 50 Feet  Assistive device None  Gait Pattern Step-through pattern;Decreased step length - right;Decreased stance time - left;Decreased stride length;Decreased weight shift to left;Ataxic  Ambulation Surface Level;Indoor  High Level Balance  High Level Balance Activities Marching forwards;Backward walking  High Level Balance Comments in parallel bars: marching fwd/bwd walking x 3 laps with cues on posture, base of support and weight shifitng. up to min assist for balance with intermittent touch to bars for balance;   Neuro Re-ed   Neuro Re-ed Details  for strengthening/to promote weight shifting/proprioceptive feedback: tall kneeling- mini-squats x 10 reps, alternating moving knee out/in x 10 reps  each side, then alternating fwd/bwd LE movements x 10 each side. up to min assist needed for balance with cues/facilittion on posture, weight shifting and ex form; quadruped- alternating contralateral UE/leg raises x 8 each side, cues/facitlitation for form and weight shfitng. able to lift both legs today                             05/15/18 1916  Balance Exercises: Standing  Rockerboard EO;UE support;Lateral;Anterior/posterior  Balance Exercises: Standing  Rebounder Limitations performed both ways on balance board:  rocking the board with emphasis on tall posture with EO. min to mod assist needed with cues on posture, weight shifting and technique for rocking the board.      PT Short Term Goals - 04/30/18 1610      PT  SHORT TERM GOAL #1   Title  Pt will improve DGI to >/= 17/24 without AD    Baseline  12/24 without AD    Status  Not Met      PT SHORT TERM GOAL #2   Title  Pt will decrease falls risk with community gait as indicated by increase in gait velocity without device except Bioness to > or = 2.6 ft/sec    Baseline  2.13 ft/sec without AD improved but not to goal    Status  Partially Met      PT SHORT TERM GOAL #3   Title  Pt will decrease falls risk as indicated by increase in BERG balance score to > or = 49/56    Baseline  04/15/18: 49/56 scored today    Status  Achieved      PT SHORT TERM GOAL #4   Title  Pt will ambulate >300' on indoor surfaces without AD with Bioness with supervision  Baseline  04/29/18: pt continues to need up to min assist with gait without AD, distance limited as well by pain    Status  Not Met        PT Long Term Goals - 03/27/18 1955      PT LONG TERM GOAL #1   Title  Pt and husband will demonstrate independence with HEP, including use of treadmill    Baseline  03/12/18: met with current program issued to date- corner balance, treadmill routine    Time  8    Period  Weeks    Status  On-going    Target Date  05/16/18      PT LONG TERM GOAL #2   Title  Pt will demonstrate improved balance and decreased falls risk as indicated by BERG balance score of > or = 52/56    Baseline  03/12/18: 46/56 - no change from last assessment    Time  8    Period  Weeks    Status  On-going    Target Date  05/16/18      PT LONG TERM GOAL #3   Title  Pt will ambulate without AD x 350' over outdoor paved surfaces with supervision; will negotiate 4 stairs with one rail with alternating sequence and supervision    Baseline  03/12/18: can negotiate steps with single rail reciprocally with supervision, continues to vary in assistance needed with gait without AD on level indoor surfaces- min to mod at times    Time  8    Period  Weeks    Status  Revised    Target Date  05/16/18       PT LONG TERM GOAL #4   Title  Pt will report improvement in Neuro QOL-LE to >/= 40%    Baseline  03/17/2018 Neuro QOL 34.3    Time  8    Period  Weeks    Status  On-going    Target Date  05/16/18      PT LONG TERM GOAL #5   Title  Pt will decrease falls risk during gait in home/community as indicated by increase in gait velocity to > or = 3.0 ft/sec    Baseline  03/14/18: 2.51 ft/sec with RW, increased from 2.4 ft/sec at last assessment    Time  8    Status  On-going    Target Date  05/16/18      PT LONG TERM GOAL #6   Title  Pt will demonstrate decreased falls risk with gait as indicated by increase in DGI score to >/= 19/24 without AD    Baseline  03/14/18: 14/24 today, decreased by 1 point from last assessment.     Time  8    Period  Weeks    Status  On-going    Target Date  05/16/18         05/13/18 1427  Plan  Clinical Impression Statement Today's skilled session continued to focus on trunk/postural control, left side weight shifting/weight bearing, dynamic gait and gait with no AD with no issues reported. No increase in hip pain reported. Pt with improved gait mechanics with resistance applied at pelvis with gait today with 2cd person for stand by assistance only. Pt made steady progress with therapy today and should benefit from continued PT to progress toward unmet goals.   Pt will benefit from skilled therapeutic intervention in order to improve on the following deficits Abnormal gait;Decreased balance;Decreased cognition;Decreased coordination;Decreased strength;Difficulty walking;Dizziness;Impaired sensation;Impaired vision/preception;Pain;Decreased activity tolerance;Decreased  mobility;Decreased endurance;Postural dysfunction  Rehab Potential Good  PT Frequency 2x / week  PT Duration 8 weeks  PT Treatment/Interventions ADLs/Self Care Home Management;Aquatic Therapy;DME Instruction;Gait training;Stair training;Functional mobility training;Therapeutic activities;Therapeutic  exercise;Balance training;Neuromuscular re-education;Patient/family education;Vestibular;Visual/perceptual remediation/compensation;Cognitive remediation;Orthotic Fit/Training;Electrical Stimulation  PT Next Visit Plan WB/proprioception/weight shifting L side: quadruped, tall kneeling, single leg sit > stand, standing in // bars with sports cord pulling R so pt weight shifts to L.  Bioness during gait and stair negotiation without AD, Bioness with treadmill  Consulted and Agree with Plan of Care Patient;Family member/caregiver  Family Member Consulted spouse          Patient will benefit from skilled therapeutic intervention in order to improve the following deficits and impairments:  Abnormal gait, Decreased balance, Decreased cognition, Decreased coordination, Decreased strength, Difficulty walking, Dizziness, Impaired sensation, Impaired vision/preception, Pain, Decreased activity tolerance, Decreased mobility, Decreased endurance, Postural dysfunction  Visit Diagnosis: Ataxic gait  Unsteadiness on feet  Other abnormalities of gait and mobility  Dizziness and giddiness     Problem List Patient Active Problem List   Diagnosis Date Noted  . 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 Weakness with dizziness-  since [facility] and CVA 04/07/17 08/07/2017  . Disturbances of vision, late effect of stroke 08/06/2017  . Health education/counseling 08/05/2017  . High risk medications (not anticoagulants) long-term use 08/05/2017  . Neuritis-  R sided:  arm and leg/ body due to stroke 08/05/2017  . Elevated LDL cholesterol level 07/11/2017  . [employer] [facility] (NIH) Stroke Scale limb ataxia score 2, ataxia present in two limbs 06/25/2017  . 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 Elevated vitamin B12 level 05/30/2017  . Vitamin D deficiency 05/29/2017  . History of tobacco abuse-  30pk yr hx - quit 04/07/17 05/21/2017  . Gait disturbance, post-stroke 05/14/2017  .  Benign essential HTN   . Vitreous hemorrhage of right eye ([place])   . Adjustment disorder with mixed anxiety and depressed mood   . 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 s/p SAH (subarachnoid hemorrhage) ([place]) 04/19/2017  . 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 Hypoxia   . Subarachnoid hemorrhage due to ruptured aneurysm ([place]) 04/07/2017  . CVA (cerebral vascular accident) ([place]) 04/07/2017  . Elevated gastrin level 01/25/2012  . Hypokalemia 01/21/2012  . Nausea & vomiting 01/20/2012  . Epigastric pain 01/20/2012  . Duodenal ulcer, acute with obstruction 10/17/2011  . S/P laparoscopic cholecystectomy 10/16/2011    Willow Ora, PTA, [facility] [address] [place], [place] [address] [phone] 05/15/18, 7:24 PM   Name: [name] Barnett MRN: [id number] Date of Birth: 05-22-1976

## 2018-05-16 ENCOUNTER — Encounter: Payer: Self-pay | Admitting: Physical Therapy

## 2018-05-16 ENCOUNTER — Ambulatory Visit: Payer: BLUE CROSS/BLUE SHIELD | Admitting: Physical Therapy

## 2018-05-16 DIAGNOSIS — R260 Ataxic gait: Secondary | ICD-10-CM | POA: Diagnosis not present

## 2018-05-16 DIAGNOSIS — R2689 Other abnormalities of gait and mobility: Secondary | ICD-10-CM

## 2018-05-16 DIAGNOSIS — R42 Dizziness and giddiness: Secondary | ICD-10-CM

## 2018-05-16 DIAGNOSIS — R2681 Unsteadiness on feet: Secondary | ICD-10-CM

## 2018-05-16 DIAGNOSIS — R278 Other lack of coordination: Secondary | ICD-10-CM

## 2018-05-16 NOTE — Therapy (Signed)
[facility] [address] [place], [place], [address] Phone: [phone]   Fax:  [phone]  Physical Therapy Treatment  Patient Details  Name: [name] Barnett MRN: [id number] Date of Birth: November 23, 1976 Referring Provider: Charlett Blake, MD   Encounter Date: 05/16/2018  PT End of Session - 05/16/18 1640    Visit Number  71    Number of Visits  24    Date for PT Re-Evaluation  05/16/18 date corrected to reflect 34 day cert    Authorization Type  BCBS    PT Start Time  1532    PT Stop Time  1620    PT Time Calculation (min)  48 min    Equipment Utilized During Treatment  Gait belt    Activity Tolerance  Patient tolerated treatment well    Behavior During Therapy  [facility] for tasks assessed/performed       Past Medical History:  Diagnosis Date  . Duodenal obstruction   . GERD (gastroesophageal reflux disease)   . Hypertension   . Stroke [facility])    april 2018    Past Surgical History:  Procedure Laterality Date  . ABDOMINAL HYSTERECTOMY    . BALLOON DILATION  12/31/2011   Procedure: BALLOON DILATION;  Surgeon: Missy Sabins, MD;  Location: [facility] ENDOSCOPY;  Service: Endoscopy;  Laterality: N/A;  . CHOLECYSTECTOMY  07/28/11  . ESOPHAGOGASTRODUODENOSCOPY  10/17/2011   Procedure: ESOPHAGOGASTRODUODENOSCOPY (EGD);  Surgeon: Missy Sabins, MD;  [facility] END[facility]COPY;  Service: Endoscopy;  Laterality: N/A;  . IR ANGIO INTRA EXTRACRAN SEL INTERNAL CAROTID BILAT MOD SED  04/07/2017  . IR ANGIO INTRA EXTRACRAN SEL INTERNAL CAROTID BILAT MOD SED  01/07/2018  . IR ANGIO VERTEBRAL SEL VERTEBRAL UNI R MOD SED  04/07/2017  . IR ANGIO VERTEBRAL SEL VERTEBRAL UNI R MOD SED  01/07/2018  . IR ANGIOGRAM FOLLOW UP STUDY  04/07/2017  . IR ANGIOGRAM FOLLOW UP STUDY  04/07/2017  . IR ANGIOGRAM FOLLOW UP STUDY  04/07/2017  . IR ANGIOGRAM FOLLOW UP STUDY  04/07/2017  . IR ANGIOGRAM FOLLOW UP STUDY  04/07/2017  . IR ANGIOGRAM SELECTIVE EACH ADDITIONAL  VESSEL  04/07/2017  . IR TRANSCATH/EMBOLIZ  04/07/2017  . IR US GUIDE VASC ACCESS RIGHT  01/07/2018  . PARS PLANA VITRECTOMY Right 05/01/2017   Procedure: PARS PLANA VITRECTOMY WITH 25 GAUGE RIGHT EYE, endolaser photocoaglation;  Surgeon: Jalene Mullet, MD;  Location: [place];  Service: Ophthalmology;  Laterality: Right;  . RADIOLOGY WITH ANESTHESIA N/A 04/07/2017   Procedure: RADIOLOGY WITH ANESTHESIA;  Surgeon: Consuella Lose, MD;  Location: [place];  Service: Radiology;  Laterality: N/A;  . REPLACEMENT TOTAL KNEE BILATERAL  2004  . TEMPOROMANDIBULAR JOINT SURGERY     2 surgeries  . TUMOR REMOVAL    . VAGOTOMY  01/23/2012   Procedure: VAGOTOMY, antrectomy and BII;  Surgeon: Haywood Lasso, MD;  Location: [place];  Service: General;  Laterality: N/A;  Laparotomy with vagotomy.    There were no vitals filed for this visit.  Subjective Assessment - 05/16/18 1537    Subjective  "I would do it again!"  Injection is working well; has been able to sleep with her RLE flat.  Can cross R ankle over L knee with much less pain.    Patient is accompained by:  Family member    Limitations  Standing;Walking    Patient Stated Goals  To walk safely without walker, improve balance so no more falls.  Currently in Pain?  Yes    Pain Score  1     Pain Location  Groin    Pain Orientation  Right    Pain Descriptors / Indicators  Discomfort         [facility] PT Assessment - 05/16/18 1542      Assessment   Medical Diagnosis  SAH and L cerebellar infarct    Referring Provider  Charlett Blake, MD    Onset Date/Surgical Date  04/07/17    Prior Therapy  inpatient rehab, HHPT/OT after D/C from hospital      Precautions   Precautions  Fall    Precaution Comments  HTN ASSESS VITALS AT [place]; dizziness and diplopia      Prior Function   Level of Independence  Independent      Observation/Other Assessments   Focus on Therapeutic Outcomes (FOTO)   40.27 Functional Status     Other Surveys    Other Surveys    Neuro Quality of Life   34.3 of 15.70 - 62.28 scale      Transfers   Transfers  Sit to Stand;Stand to Sit    Sit to Stand  6: Modified independent (Device/Increase time)    Stand to Sit  6: Modified independent (Device/Increase time)      Ambulation/Gait   Ambulation/Gait  Yes    Ambulation/Gait Assistance  4: Min assist    Ambulation/Gait Assistance Details  PT provided stabilization and grounding through bilateral shoulder retraction and depression to provide proximal stability and to maintain COG over BOS for improved motor control during gait.    Ambulation Distance (Feet)  230 Feet    Assistive device  None    Gait Pattern  Step-through pattern;Decreased stance time - left;Decreased stride length;Decreased weight shift to left;Ataxic    Ambulation Surface  Level;Indoor    Stairs  Yes    Stairs Assistance  5: Supervision    Stair Management Technique  Two rails;Alternating pattern;Forwards    Number of Stairs  8    Height of Stairs  6      Standardized Balance Assessment   Standardized Balance Assessment  Berg Balance Test;Dynamic Gait Index;10 meter walk test    10 Meter Walk  17.65 or 1.85 ft/sec without AD, min-mod A      Berg Balance Test   Sit to Stand  Able to stand without using hands and stabilize independently    Standing Unsupported  Able to stand safely 2 minutes    Sitting with Back Unsupported but Feet Supported on Floor or Stool  Able to sit safely and securely 2 minutes    Stand to Sit  Sits safely with minimal use of hands    Transfers  Able to transfer safely, minor use of hands    Standing Unsupported with Eyes Closed  Able to stand 10 seconds safely    Standing Ubsupported with Feet Together  Able to place feet together independently and stand for 1 minute with supervision    From Standing, Reach Forward with Outstretched Arm  Can reach confidently >25 cm (10")    From Standing Position, Pick up Object from Floor  Able to pick up shoe safely  and easily    From Standing Position, Turn to Look Behind Over each Shoulder  Looks behind from both sides and weight shifts well    Turn 360 Degrees  Able to turn 360 degrees safely but slowly    Standing Unsupported, Alternately Place Feet on  Step/Stool  Able to complete 4 steps without aid or supervision    Standing Unsupported, One Foot in [place] to take small step independently and hold 30 seconds    Standing on One Leg  Able to lift leg independently and hold 5-10 seconds    Total Score  48    Berg comment:  48/56      Dynamic Gait Index   Level Surface  Severe Impairment    Change in Gait Speed  Severe Impairment    Gait with Horizontal Head Turns  Severe Impairment    Gait with Vertical Head Turns  Severe Impairment    Gait and Pivot Turn  Moderate Impairment    Step Over Obstacle  Moderate Impairment    Step Around Obstacles  Moderate Impairment    Steps  Mild Impairment    Total Score  5    DGI comment:  5/24                         PT Education - 05/16/18 1639    Education provided  Yes    Education Details  areas of balance to continue to focus on at home; how to assist with gait at home; renewal x 8 more weeks    Person(s) Educated  Patient;Spouse    Methods  Explanation;Demonstration    Comprehension  Verbalized understanding       PT Short Term Goals - 04/30/18 1610      PT SHORT TERM GOAL #1   Title  Pt will improve DGI to >/= 17/24 without AD    Baseline  12/24 without AD    Status  Not Met      PT SHORT TERM GOAL #2   Title  Pt will decrease falls risk with community gait as indicated by increase in gait velocity without device except Bioness to > or = 2.6 ft/sec    Baseline  2.13 ft/sec without AD improved but not to goal    Status  Partially Met      PT SHORT TERM GOAL #3   Title  Pt will decrease falls risk as indicated by increase in BERG balance score to > or = 49/56    Baseline  04/15/18: 49/56 scored today    Status   Achieved      PT SHORT TERM GOAL #4   Title  Pt will ambulate >300' on indoor surfaces without AD with Bioness with supervision    Baseline  04/29/18: pt continues to need up to min assist with gait without AD, distance limited as well by pain    Status  Not Met        PT Long Term Goals - 05/16/18 1540      PT LONG TERM GOAL #1   Title  Pt and husband will demonstrate independence with HEP, including use of treadmill    Baseline  --    Time  8    Period  Weeks    Status  Achieved      PT LONG TERM GOAL #2   Title  Pt will demonstrate improved balance and decreased falls risk as indicated by BERG balance score of > or = 52/56    Baseline  48/56    Time  8    Period  Weeks    Status  Partially Met      PT LONG TERM GOAL #3   Title  Pt will ambulate without  AD x 350' over outdoor paved surfaces with supervision; will negotiate 4 stairs with one rail with alternating sequence and supervision    Baseline  min-mod A without AD; stair goal met    Time  8    Period  Weeks    Status  Partially Met      PT LONG TERM GOAL #4   Title  Pt will report improvement in Neuro QOL-LE to >/= 40%    Baseline  03/17/2018 Neuro QOL 34.3    Time  8    Period  Weeks    Status  On-going      PT LONG TERM GOAL #5   Title  Pt will decrease falls risk during gait in home/community as indicated by increase in gait velocity to > or = 3.0 ft/sec    Baseline  decreased ti 1.85 ft/sec today without AD; min-mod A    Time  8    Status  Not Met      PT LONG TERM GOAL #6   Title  Pt will demonstrate decreased falls risk with gait as indicated by increase in DGI score to >/= 19/24 without AD    Baseline  decreased to 5/24    Time  8    Period  Weeks    Status  Not Met         05/16/18 1630  Plan  Clinical Impression Statement Treatment session today focused on assessment of pt progress towards LTG and functional mobility after R hip injection.  Pt reports significant pain relief in R hip (from 9 to 1  overall) and ability to sleep and move more comfortably.  Pt was demonstrating steady progress towards LTG despite the pain pt was having prior to injection but that has not been reflected in patient's performance of gait and balance outcome measures today.  Pt has met 1/6 LTG today; pt is maintaining static/dynamic standing balance as indicated by BERG balance score but pt experienced a decrease in gait velocity and DGI score today and required increased assistance for balance, motor control and postural control.  Pt reporting increased dizziness today and decreased attention to gait pattern due to absence of pain.  Despite improvementin pain pt continues to be at high risk for fall. Pt is motivated to continue with therapy and continue to work on gait with treadmill and HEP at home with assistance of husband.  Pt will benefit from continued therapy services to address ongoing vestibular impairments, impaired strength, coordination, motor planning, balance, postural control, and gait to maximize functional mobility independence and decrease falls risk.  Pt will benefit from skilled therapeutic intervention in order to improve on the following deficits Abnormal gait;Decreased balance;Decreased cognition;Decreased coordination;Decreased strength;Difficulty walking;Dizziness;Impaired sensation;Impaired vision/preception;Pain;Decreased activity tolerance;Decreased mobility;Decreased endurance;Postural dysfunction  Rehab Potential Good  PT Frequency 2x / week  PT Duration 12 weeks  PT Treatment/Interventions ADLs/Self Care Home Management;Aquatic Therapy;DME Instruction;Gait training;Stair training;Functional mobility training;Therapeutic activities;Therapeutic exercise;Balance training;Neuromuscular re-education;Patient/family education;Vestibular;Visual/perceptual remediation/compensation;Cognitive remediation;Orthotic Fit/Training;Electrical Stimulation;Cryotherapy;Moist Heat;Manual techniques;Passive range of  motion;Taping  PT Next Visit Plan Re-do FOTO; review for HEP: SLS, cabinet taps, tandem stance/gait, feet together in a corner; re-do vestibular assessment and update HEP; WB/proprioception/weight shifting L side: sidelying, quadruped, tall kneeling, single leg sit > stand, standing in // bars with sports cord pulling R so pt weight shifts to L.  Bioness during gait and stair negotiation without AD, Bioness with treadmill  Consulted and Agree with Plan of Care Patient;Family member/caregiver  Family Member Consulted spouse  Patient will benefit from skilled therapeutic intervention in order to improve the following deficits and impairments:  Abnormal gait, Decreased balance, Decreased cognition, Decreased coordination, Decreased strength, Difficulty walking, Dizziness, Impaired sensation, Impaired vision/preception, Pain, Decreased activity tolerance, Decreased mobility, Decreased endurance, Postural dysfunction  Visit Diagnosis: Ataxic gait  Unsteadiness on feet  Other abnormalities of gait and mobility  Dizziness and giddiness  Other lack of coordination     Problem List Patient Active Problem List   Diagnosis Date Noted  . 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 Weakness with dizziness-  since [facility] and CVA 04/07/17 08/07/2017  . Disturbances of vision, late effect of stroke 08/06/2017  . Health education/counseling 08/05/2017  . High risk medications (not anticoagulants) long-term use 08/05/2017  . Neuritis-  R sided:  arm and leg/ body due to stroke 08/05/2017  . Elevated LDL cholesterol level 07/11/2017  . [employer] of Health (NIH) Stroke Scale limb ataxia score 2, ataxia present in two limbs 06/25/2017  . 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 Elevated vitamin B12 level 05/30/2017  . Vitamin D deficiency 05/29/2017  . History of tobacco abuse-  30pk yr hx - quit 04/07/17 05/21/2017  . Gait disturbance, post-stroke 05/14/2017  . Benign  essential HTN   . Vitreous hemorrhage of right eye ([place])   . Adjustment disorder with mixed anxiety and depressed mood   . 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 s/p SAH (subarachnoid hemorrhage) ([place]) 04/19/2017  . 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 Hypoxia   . Subarachnoid hemorrhage due to ruptured aneurysm ([place]) 04/07/2017  . CVA (cerebral vascular accident) ([place]) 04/07/2017  . Elevated gastrin level 01/25/2012  . Hypokalemia 01/21/2012  . Nausea & vomiting 01/20/2012  . Epigastric pain 01/20/2012  . Duodenal ulcer, acute with obstruction 10/17/2011  . S/P laparoscopic cholecystectomy 10/16/2011    Rico Junker, PT, DPT 05/18/18    2:38 PM    [facility] [address] [place], [place], [address] Phone: [phone]   Fax:  [phone]  Name: [name] Barnett MRN: [id number] Date of Birth: [birth year]

## 2018-05-20 ENCOUNTER — Encounter: Payer: Self-pay | Admitting: Physical Therapy

## 2018-05-20 ENCOUNTER — Ambulatory Visit: Payer: BLUE CROSS/BLUE SHIELD | Admitting: Physical Therapy

## 2018-05-20 DIAGNOSIS — R42 Dizziness and giddiness: Secondary | ICD-10-CM

## 2018-05-20 DIAGNOSIS — R2689 Other abnormalities of gait and mobility: Secondary | ICD-10-CM

## 2018-05-20 DIAGNOSIS — R260 Ataxic gait: Secondary | ICD-10-CM | POA: Diagnosis not present

## 2018-05-20 DIAGNOSIS — R2681 Unsteadiness on feet: Secondary | ICD-10-CM

## 2018-05-20 NOTE — Patient Instructions (Addendum)
Perform these at the counter for balance assistance as needed:  Single Leg - Eyes Open    Holding support, lift right leg while maintaining balance over other leg. Progress to removing hands from support surface for longer periods of time. Hold_20_ seconds.  Switch legs: stand on left leg while lifting the right foot up: hold for 20 seconds while trying to progress to removing hands from support surface for longer periods of time.  Repeat _3_ times on each leg per session. Do _1-2_ sessions per day.  Copyright  VHI. All rights reserved.   With the bottom cabinets open and holding on with both hands. - Place to pieces of blue (painter's) tape on inside of cabinet. - alternating tapping the tap (forward direction) with each foot. - 10 reps with each leg. - 1-2 times a day.   Perform these in a corner with a chair in front for safety and some one next to you:  Feet Apart, Head Motion - Eyes Closed    With eyes closed and feet shoulder width apart, move head slowly 1. Up and down x 10  2. Left and right x 10 reps Do _1-2_ sessions per day.  Copyright  VHI. All rights reserved.    Feet Together, Head Motion - Eyes Open    With eyes open, feet together, move head slowly:  1. Up and down x 10 reps 2. Left and right x 10 reps  Do _1-2_ sessions per day.  Copyright  VHI. All rights reserved.    Feet Partial Heel-Toe, Varied Arm Positions - Eyes Open    With eyes open, right foot partially in front of the other, arms as needed for balance, look straight ahead at a stationary object. Hold _30_ seconds.  Switch feet with left foot partially in front of the other, arms as needed for balance, look straight ahead at a stationary object. Hold for 30 seconds.   Repeat _3_ times each foot forward per session. Do _1-2_ sessions per day.  Copyright  VHI. All rights reserved.

## 2018-05-21 ENCOUNTER — Encounter: Payer: Self-pay | Admitting: Family Medicine

## 2018-05-21 ENCOUNTER — Ambulatory Visit: Payer: BLUE CROSS/BLUE SHIELD | Admitting: Family Medicine

## 2018-05-21 VITALS — BP 126/84 | HR 84 | Ht 64.0 in | Wt 166.0 lb

## 2018-05-21 DIAGNOSIS — E7800 Pure hypercholesterolemia, unspecified: Secondary | ICD-10-CM | POA: Diagnosis not present

## 2018-05-21 DIAGNOSIS — I10 Essential (primary) hypertension: Secondary | ICD-10-CM | POA: Diagnosis not present

## 2018-05-21 DIAGNOSIS — I725 Aneurysm of other precerebral arteries: Secondary | ICD-10-CM

## 2018-05-21 DIAGNOSIS — Z87891 Personal history of nicotine dependence: Secondary | ICD-10-CM | POA: Diagnosis not present

## 2018-05-21 DIAGNOSIS — E559 Vitamin D deficiency, unspecified: Secondary | ICD-10-CM

## 2018-05-21 DIAGNOSIS — I639 Cerebral infarction, unspecified: Secondary | ICD-10-CM | POA: Diagnosis not present

## 2018-05-21 DIAGNOSIS — F4323 Adjustment disorder with mixed anxiety and depressed mood: Secondary | ICD-10-CM | POA: Diagnosis not present

## 2018-05-21 MED ORDER — FLUOXETINE HCL 20 MG PO CAPS: 60.0000 mg | ORAL_CAPSULE | Freq: Every day | ORAL | 1 refills | Status: DC

## 2018-05-21 NOTE — Patient Instructions (Signed)
Goal is to stop smoking.  Do not by any more cigarettes after you are done with your current pack.  Please share your intent to quit with your family members as I do not want him driving you to the store to buy more.  I want you to exercise at least to a goal of 30 minutes 5 days/week minimum.  Joining a gym or having a workout partner is always helpful.  In addition to increasing her Prozac from 40 to 60 mg daily, I highly recommend you obtain a counselor in the near future to help you with your motions and this you distress or in your life.  Behavioral Health/ Counseling Referrals    [name] KiltsJessie Barnett, personal counselor in [place]     [name] SalterLaura Barnett, specializing in marriage counseling    Dr. [name], [name] Barnett Dr. [name], [name] Barnett is a counselor in [place], [place].  [address] [place], [place] [address] Contact Information [phone]   [name] NodalJo Heather Barnett, [place]  [phone] [email or web address] ( she does [place]ian and faith-based coaching and counseling )    [facility]- ( faith-based counseling ) Address: [address] [place], [place] [address] [phone] Office [address] for appointments [phone] Fax Hours: Monday - Thursday 8:00am-6:00pm Closed for lunch 12-1Thursday only Friday: Closed all day   [name] OrleansVanessa Barnett: [phone] or [name] SwazilandJordan- [phone] -counselors in [place] who are faith based    [facility] [name] CiscoBARBARA Barnett, [place], ACSW, M.ED.  -[name] CiscoBarbara Barnett is a licensed clinical social worker in practice over 35 years and with Dr. Milagros Evenerupinder Kaur for the last 10 years.  -She sees adults, adolescents, children & families and couples. -Services are provided for mood and anxiety disorders, marital issues, family or parent/child problems, parenting, co-dependency, gender issues, trauma, grief, and stages of life issues. She also provides critical incident stress debriefing.  -[name] MccreedyBarbara  accepts many employee assistance programs (EAP), [employer]commercial insurance, ChiropractorMedicare and Tricare.  PHONE  [phone]                FAX [phone]   [name] BerkshireScott Barnett -[name] [employer]- gen counseling;  [name] Barnett   Corine ShelterResa Barnett, MSW [address]  [place] [phone]   [facility] [name] Guileavid Gutterman, [name] Barnett [address], [place] [phone]   [facility]- children [address], [address], [place] [phone]   [name] BeechamAlicia Barnett-Licensed Professional Counselor Counseling and [employer] Planning [phone]   [name]-Clinical Manager [address], [place] [phone]   [name], [place] [phone] [name] NineMichie Dew, [name] Barnett   -adult, adolescent, and child Dayton ScrapeStuart Hunt, [name] Barnett [name] Kinnierlair Huprich, [name] Barnett [name] MewsJennifer Sommer, [name] Barnett-child, adolescent and adults   [facility] [address], [place] [phone] [name] PeacockSara DeHart-Young, MS-child, adolescent and adults [name] EtienneKatherine Glenn, [name] Barnett-adolescent and adults   [facility]-grief, terminal illness [address], [place] [phone]   [name], [place] [phone] [name] [name] [phone]   [name], [place] [phone]   [name] [address], [address], [place] [phone]   [name], [place] [phone]   [name] [address], [place] [phone]   [name] [address], [address], [place] [phone]

## 2018-05-21 NOTE — Progress Notes (Signed)
Impression and Recommendations:    1. Adjustment disorder with mixed anxiety and depressed mood   2. Benign essential HTN   3. Elevated LDL cholesterol level   4. Cerebrovascular accident (CVA), unspecified mechanism ([name])   5. History of tobacco abuse-  30pk yr hx - quit 04/07/17   6. Basilar artery aneurysm ([name])   7. Vitamin D deficiency    1. Hypertension - Patient's BP stable and asymptomatic at this time.  Continue to monitor. - Continue medications and treatment as prescribed.  - Blood pressure medication refilled today.  - Lifestyle changes such as dash diet and engaging in a regular exercise program discussed with patient.  Educational handouts provided  - Ambulatory BP monitoring encouraged. Keep log and bring in next OV  2. Mood - Dose of Prozac increased from 40 to 60  Will continue to monitor.  - Advised the patient to leave the house - to do stimulating activities and find a "purpose."  Encouraged patient's purpose to be getting physically well.  - Patient should establish a weekly routine of exercise to get stronger, and help her feel better about herself.  - Patient agrees to exercise 5 days per week moving forward.  - Reviewed the "spokes of the wheel of mood" (and any disease); emphasized that medication is just one spoke.  Encouraged patient to begin consulting with a counselor, eating more healthfully, exercising more often, and beginning regular meditation or prayer.  - Encouraged patient to speak with neurology about her personality changes post-stroke.  - List of available counselors provided to patient during appointment today.  Patient states several times that she is not ready to seek treatment at this time.  3. BMI Counseling Explained to patient what BMI refers to, and what it means medically.    Told patient to think about it as a "medical risk stratification measurement" and how increasing BMI is associated with increasing risk/ or worsening  state of various diseases such as hypertension, hyperlipidemia, diabetes, premature OA, depression etc.  American Heart Association guidelines for healthy diet, basically Mediterranean diet, and exercise guidelines of 30 minutes 5 days per week or more discussed in detail.  Health counseling performed.  All questions answered.  4. Lifestyle & Wellness - TOBACCO CESSATION: Advised patient to please quit smoking.   - Goal: Once her last pack of cigarettes is finished, she buys no more cigarettes. - Advised patient to register with scheduled classes at the gym every day to replace the urge to smoke.  - Advised patient to continue working toward exercising to improve health.    - Resume exercising on the treadmill 15-30 minutes per day.  Recommended that the patient eventually strive for at least 150 minutes of moderate cardiovascular activity per week according to guidelines established by the [facility].   - Healthy dietary habits encouraged, including low-carb, and high amounts of lean protein in diet.  Encouraged patient to eat regularly.  - Patient should also consume adequate amounts of water - half of body weight in oz of water per day.  5. Follow-Up - Return for regularly scheduled chronic follow-up.   Education and routine counseling performed. Handouts provided.  Pt was in the office today for 32.5+ minutes, with over 50% time spent in face to face counseling of patients various medical conditions, treatment plans of those medical conditions including medicine management and lifestyle modification, strategies to improve health and well being; and in coordination of care. SEE ABOVE TREATMENT PLAN FOR DETAILS  Meds ordered this encounter  Medications  . FLUoxetine (PROZAC) 20 MG capsule    Sig: Take 3 capsules (60 mg total) by mouth daily.    Dispense:  270 capsule    Refill:  1    The patient was counseled, risk factors were discussed, anticipatory guidance given.  Gross side  effects, risk and benefits, and alternatives of medications discussed with patient.  Patient is aware that all medications have potential side effects and we are unable to predict every side effect or drug-drug interaction that may occur.  Expresses verbal understanding and consents to current therapy plan and treatment regimen.  Return for 17mo Mood--> we inc prozac from 40 to 60mg .  Please see AVS handed out to patient at the end of our visit for further patient instructions/ counseling done pertaining to today's office visit.    This document serves as a record of services personally performed by Thomasene Lot, DO. It was created on her behalf by Peggye Fothergill, a trained medical scribe. The creation of this record is based on the scribe's personal observations and the provider's statements to them.   I have reviewed the above medical documentation for accuracy and completeness and I concur.  Thomasene Lot 05/22/18 5:16 AM    Subjective:    HPI: [name] Barnett is a 42 y.o. female who presents to [facility] today for follow up for HTN.    Patient resumed smoking; is smoking about 5 cigarettes a day.  States she is doing this due to feeling bored.  Patient is here today with her mother, because her mother is interested in establishing with a new doctor.  General Wellness Notes she's doing well; attends physical therapy twice a week.   Goes to physical therapy Tuesday and Thursday. Monday and Wednesday she uses the treadmill.  States she's "getting fat."  Notes "I'm not exercising like I should be." She notes that she works out on the treadmill, "not like I should though." With regards to this, remarks "I get lazy."  Comments that she sometimes has a hard time eating regularly, stating "I don't eat right."  Patient doesn't want to use her walker in public due to pride.  Mood At first, patient notes that her mood is typically good.  Takes 2  capsules of Prozac by mouth daily.  However, as discussion progresses, patient admits that sometimes she feels she needs to go up on her dose.  She has been having a hard time getting out of bed for the past six days.  Woke up this morning at 7 AM, but went back to sleep until 11 AM.  Admits that she's been sleeping too much lately, which is downing her mood.  States that she's with her husband all the time, which sometimes gets difficult. States that her husband is a jerk to her sometimes, which ruins her mood.  States "I just don't care.  It's hard to explain it.  I care, but I don't care." Repeats "I just don't care" several times.  "I'm not stressed, I just don't care." Comments "I'm pissed at myself, not anyone else."  Patient notes that she doesn't feel like her mood is up and down.   However, she does have sudden outbursts from time to time. Mother states that prior to patient's stroke, she did not have these outbursts.  Patient rejects the notion of counseling at this time.  However, she acknowledges that her mood needs improvement.  HTN:  -  Her blood pressure has been controlled at home.   - Patient reports good compliance with blood pressure medications.  - Denies medication S-E   - Smoking Status noted   - She denies new onset of: chest pain, exercise intolerance, shortness of breath, dizziness, visual changes, headache, lower extremity swelling or claudication.   Any dizziness she does have has been very much reduced.  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   Last 3 blood pressure readings in our office are as follows: BP Readings from Last 3 Encounters:  05/21/18 126/84  05/12/18 116/75  05/09/18 (!) 152/95    Pulse Readings from Last 3 Encounters:  05/21/18 84  05/12/18 74  05/09/18 74    Filed Weights   05/21/18 1439  Weight: 166 lb (75.3 kg)      Patient Care Team    Relationship Specialty  Notifications Start End  Thomasene Lot, DO PCP - General Family Medicine  04/30/17   Charlott Rakes, MD Consulting Physician Gastroenterology  10/17/11   Lisbeth Renshaw, MD Consulting Physician Neurosurgery  05/21/17   Erick Colace, MD Consulting Physician Physical Medicine and Rehabilitation  05/21/17   Olivia Mackie, MD Consulting Physician Obstetrics and Gynecology  05/21/17   Carmela Rima, MD Consulting Physician Ophthalmology  06/13/17    Comment: Neuro ophthalmologist     Lab Results  Component Value Date   CREATININE 0.79 02/14/2018   BUN 10 02/14/2018   NA 146 (H) 02/14/2018   K 4.3 02/14/2018   CL 109 (H) 02/14/2018   CO2 23 02/14/2018    Lab Results  Component Value Date   CHOL 149 02/14/2018   CHOL 213 (H) 05/21/2017    Lab Results  Component Value Date   HDL 68 02/14/2018   HDL 74 05/21/2017    Lab Results  Component Value Date   LDLCALC 62 02/14/2018   LDLCALC 111 (H) 05/21/2017    Lab Results  Component Value Date   TRIG 97 02/14/2018   TRIG 138 05/21/2017    Lab Results  Component Value Date   CHOLHDL 2.2 02/14/2018   CHOLHDL 2.9 05/21/2017    No results found for: LDLDIRECT ===================================================================   Patient Active Problem List   Diagnosis Date Noted  . Neuritis-  R sided:  arm and leg/ body due to stroke 08/05/2017    Priority: High  . Elevated LDL cholesterol level 07/11/2017    Priority: High  . History of tobacco abuse-  30pk yr hx - quit 04/07/17 05/21/2017    Priority: High  . Benign essential HTN     Priority: High  . Basilar artery aneurysm ([name])     Priority: High  . 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 CVA (cerebral vascular accident) ([name]) 04/07/2017    Priority: High  . Vitamin D deficiency 05/29/2017    Priority: Medium  . Adjustment disorder with mixed anxiety and depressed mood     Priority: Medium  .  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 Gait disturbance, post-stroke 05/14/2017    Priority: Low  . Vitreous hemorrhage of right eye ([name])     Priority: Low  . s/p SAH (subarachnoid hemorrhage) ([name]) 04/19/2017    Priority: Low  . Duodenal ulcer, acute with obstruction 10/17/2011    Priority: Low  . 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 Weakness with  dizziness-  since [facility] and CVA 04/07/17 08/07/2017  . Disturbances of vision, late effect of stroke 08/06/2017  . Health education/counseling 08/05/2017  . High risk medications (not anticoagulants) long-term use 08/05/2017  . [employer] (NIH) Stroke Scale limb ataxia score 2, ataxia present in two limbs 06/25/2017  . Elevated vitamin B12 level 05/30/2017  . 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 Hypoxia   . Elevated gastrin level 01/25/2012  . Hypokalemia 01/21/2012  . Nausea & vomiting 01/20/2012  . Epigastric pain 01/20/2012  . S/P laparoscopic cholecystectomy 10/16/2011     Past Medical History:  Diagnosis Date  . Duodenal obstruction   . GERD (gastroesophageal reflux disease)   . Hypertension   . Stroke [facility])    april 2018     Past Surgical History:  Procedure Laterality Date  . ABDOMINAL HYSTERECTOMY    . BALLOON DILATION  12/31/2011   Procedure: BALLOON DILATION;  Surgeon: Barrie Folk, MD;  Location: [facility] ENDOSCOPY;  Service: Endoscopy;  Laterality: N/A;  . CHOLECYSTECTOMY  07/28/11  . ESOPHAGOGASTRODUODENOSCOPY  10/17/2011   Procedure: ESOPHAGOGASTRODUODENOSCOPY (EGD);  Surgeon: Barrie Folk, MD;  Location[facility] ENDOSCOPY;  Service: Endoscopy;  Laterality: N/A;  . IR ANGIO INTRA EXTRACRAN SEL INTERNAL CAROTID BILAT MOD SED  04/07/2017  . IR ANGIO INTRA EXTRACRAN SEL INTERNAL CAROTID BILAT MOD SED  01/07/2018  . IR ANGIO VERTEBRAL SEL VERTEBRAL UNI R MOD SED  04/07/2017  . IR ANGIO VERTEBRAL SEL  VERTEBRAL UNI R MOD SED  01/07/2018  . IR ANGIOGRAM FOLLOW UP STUDY  04/07/2017  . IR ANGIOGRAM FOLLOW UP STUDY  04/07/2017  . IR ANGIOGRAM FOLLOW UP STUDY  04/07/2017  . IR ANGIOGRAM FOLLOW UP STUDY  04/07/2017  . IR ANGIOGRAM FOLLOW UP STUDY  04/07/2017  . IR ANGIOGRAM SELECTIVE EACH ADDITIONAL VESSEL  04/07/2017  . IR TRANSCATH/EMBOLIZ  04/07/2017  . IR US GUIDE VASC ACCESS RIGHT  01/07/2018  . PARS PLANA VITRECTOMY Right 05/01/2017   Procedure: PARS PLANA VITRECTOMY WITH 25 GAUGE RIGHT EYE, endolaser photocoaglation;  Surgeon: Carmela Rima, MD;  Location[facility] OR;  Service: Ophthalmology;  Laterality: Right;  . RADIOLOGY WITH ANESTHESIA N/A 04/07/2017   Procedure: RADIOLOGY WITH ANESTHESIA;  Surgeon: Lisbeth Renshaw, MD;  Location: [facility] OR;  Service: Radiology;  Laterality: N/A;  . REPLACEMENT TOTAL KNEE BILATERAL  2004  . TEMPOROMANDIBULAR JOINT SURGERY     2 surgeries  . TUMOR REMOVAL    . VAGOTOMY  01/23/2012   Procedure: VAGOTOMY, antrectomy and BII;  Surgeon: Currie Paris, MD;  Location: [facility] OR;  Service: General;  Laterality: N/A;  Laparotomy with vagotomy.     Family History  Problem Relation Age of Onset  . Hypertension Mother   . Restless legs syndrome Mother   . Hyperlipidemia Mother   . Heart disease Father   . Malignant hyperthermia Neg Hx      Social History   Substance and Sexual Activity  Drug Use No  ,  Social History   Substance and Sexual Activity  Alcohol Use No  ,  Social History   Tobacco Use  Smoking Status Former Smoker  . Packs/day: 1.00  . Years: 20.00  . Pack years: 20.00  . Types: Cigarettes  . Last attempt to quit: 04/07/2017  . Years since quitting: 1.1  Smokeless Tobacco Never Used  ,    Current Outpatient Medications on File Prior to Visit  Medication Sig Dispense Refill  . amLODipine (NORVASC) 2.5 MG tablet TAKE 1 TABLET BY MOUTH EVERY DAY (Patient taking differently: TAKE 5 mg daily) 90 tablet 1  . atorvastatin (LIPITOR) 20 MG  tablet Take 1 tablet (20 mg total) by mouth at bedtime. 90 tablet 3  . Coconut Oil 1000 MG CAPS Take 1,000 mg by mouth daily.    [name]. gabapentin (NEURONTIN) 600 MG tablet Take 1 tablet (600 mg total) by mouth 4 (four) times daily. (Patient taking differently: Take 600 mg by mouth 2 (two) times daily. ) 360 tablet 1  . Multiple Vitamin (MULTIVITAMIN WITH MINERALS) TABS tablet Take 1 tablet by mouth daily.    . niacin 500 MG tablet Take 500 mg by mouth at bedtime.    . topiramate (TOPAMAX) 50 MG tablet TAKE 1 TABLET BY MOUTH TWICE A DAY 180 tablet 0  . traMADol (ULTRAM) 50 MG tablet Take 1 tablet (50 mg total) by mouth 2 (two) times daily. 30 tablet 0  . Vitamin D, Ergocalciferol, (DRISDOL) 50000 units CAPS capsule Take 1 capsule (50,000 Units total) by mouth every 7 (seven) days. 12 capsule 10   No current facility-administered medications on file prior to visit.      Allergies  Allergen Reactions  . Nsaids Other (See Comments)    Pt diagnosed with near-obstructing circumferential ulcer of duodenum [id number]     Review of Systems:   General:  Denies fever, chills Optho/Auditory:   Denies visual changes, blurred vision Respiratory:   Denies SOB, cough, wheeze, DIB  Cardiovascular:   Denies chest pain, palpitations, painful respirations Gastrointestinal:   Denies nausea, vomiting, diarrhea.  Endocrine:     Denies new hot or cold intolerance Musculoskeletal:  Denies joint swelling, gait issues, or new unexplained myalgias/ arthralgias Skin:  Denies rash, suspicious lesions  Neurological:    Denies dizziness, unexplained weakness, numbness  Psychiatric/Behavioral:   Denies mood changes  Objective:    Blood pressure 126/84, pulse 84, height 5\' 4"  (1.626 m), weight 166 lb (75.3 kg), SpO2 98 %.  Body mass index is 28.49 kg/m.  General: Well Developed, well nourished, and in no acute distress.  HEENT: Normocephalic, atraumatic, pupils equal round reactive to light, neck supple, No carotid  bruits, no JVD Skin: Warm and dry, cap RF less 2 sec Cardiac: Regular rate and rhythm, S1, S2 WNL's, no murmurs rubs or gallops Respiratory: ECTA B/L, Not using accessory muscles, speaking in full sentences. NeuroM-Sk: Ambulates w/o assistance, moves ext * 4 w/o difficulty, sensation grossly intact.  Ext: scant edema b/l lower ext Psych: No HI/SI, judgement and insight good, Euthymic mood. Full Affect.

## 2018-05-22 NOTE — Therapy (Signed)
[facility] [address] [place], [place], [address] Phone: [phone]   Fax:  [phone]  Physical Therapy Treatment  Patient Details  Name: [name] Barnett MRN: [id number] Date of Birth: [birth year] Referring Provider: Erick Colace, MD   Encounter Date: 05/20/2018   05/20/18 1450  PT Visits / Re-Eval  Visit Number 72  Number of Visits 95 (per recertification)  Date for PT Re-Evaluation 08/16/18 (12 week recertification)  Authorization  Authorization Type [employer]  PT Time Calculation  PT Start Time 1449  PT Stop Time 1530  PT Time Calculation (min) 41 min  PT - End of Session  Equipment Utilized During Treatment Gait belt  Activity Tolerance Patient tolerated treatment well;No increased pain  Behavior During Therapy WFL for tasks assessed/performed     Past Medical History:  Diagnosis Date  . Duodenal obstruction   . GERD (gastroesophageal reflux disease)   . Hypertension   . Stroke [facility])    april 2018    Past Surgical History:  Procedure Laterality Date  . ABDOMINAL HYSTERECTOMY    . BALLOON DILATION  12/31/2011   Procedure: BALLOON DILATION;  Surgeon: Barrie Folk, MD;  Location: [facility] ENDOSCOPY;  Service: Endoscopy;  Laterality: N/A;  . CHOLECYSTECTOMY  07/28/11  . ESOPHAGOGASTRODUODENOSCOPY  10/17/2011   Procedure: ESOPHAGOGASTRODUODENOSCOPY (EGD);  Surgeon: Barrie Folk, MD;  Location: [facility] ENDOSCOPY;  Service: Endoscopy;  Laterality: N/A;  . IR ANGIO INTRA EXTRACRAN SEL INTERNAL CAROTID BILAT MOD SED  04/07/2017  . IR ANGIO INTRA EXTRACRAN SEL INTERNAL CAROTID BILAT MOD SED  01/07/2018  . IR ANGIO VERTEBRAL SEL VERTEBRAL UNI R MOD SED  04/07/2017  . IR ANGIO VERTEBRAL SEL VERTEBRAL UNI R MOD SED  01/07/2018  . IR ANGIOGRAM FOLLOW UP STUDY  04/07/2017  . IR ANGIOGRAM FOLLOW UP STUDY  04/07/2017  . IR ANGIOGRAM FOLLOW UP STUDY  04/07/2017  . IR ANGIOGRAM FOLLOW UP STUDY  04/07/2017  . IR ANGIOGRAM FOLLOW UP  STUDY  04/07/2017  . IR ANGIOGRAM SELECTIVE EACH ADDITIONAL VESSEL  04/07/2017  . IR TRANSCATH/EMBOLIZ  04/07/2017  . IR US GUIDE VASC ACCESS RIGHT  01/07/2018  . PARS PLANA VITRECTOMY Right 05/01/2017   Procedure: PARS PLANA VITRECTOMY WITH 25 GAUGE RIGHT EYE, endolaser photocoaglation;  Surgeon: Carmela Rima, MD;  Location: [facility] OR;  Service: Ophthalmology;  Laterality: Right;  . RADIOLOGY WITH ANESTHESIA N/A 04/07/2017   Procedure: RADIOLOGY WITH ANESTHESIA;  Surgeon: Lisbeth Renshaw, MD;  Location: [facility] OR;  Service: Radiology;  Laterality: N/A;  . REPLACEMENT TOTAL KNEE BILATERAL  2004  . TEMPOROMANDIBULAR JOINT SURGERY     2 surgeries  . TUMOR REMOVAL    . VAGOTOMY  01/23/2012   Procedure: VAGOTOMY, antrectomy and BII;  Surgeon: Currie Paris, MD;  Location: [facility] OR;  Service: General;  Laterality: N/A;  Laparotomy with vagotomy.    There were no vitals filed for this visit.     05/20/18 1450  Symptoms/Limitations  Subjective No new complaints. Hip continues to feel good, still with groin pain with certain movements, none at rest. No falls.   Patient is accompained by: Family member  Limitations Standing;Walking  Patient Stated Goals To walk safely without walker, improve balance so no more falls.  Pain Assessment  Currently in Pain? No/denies  Pain Score 0      05/20/18 1800  Observation/Other Assessments  Focus on Therapeutic Outcomes (FOTO)  51 Functional Status   Other Surveys  Other Surveys  Neuro Quality  of Life  35.9 scale range 15.70-62.28   Issued the following to pt's HEP with min guard assist for balance. Cues on ex form/technique. Perform these at the counter for balance assistance as needed:  Single Leg - Eyes Open    Holding support, lift right leg while maintaining balance over other leg. Progress to removing hands from support surface for longer periods of time. Hold_20_ seconds.  Switch legs: stand on left leg while lifting the right foot up: hold for  20 seconds while trying to progress to removing hands from support surface for longer periods of time.  Repeat _3_ times on each leg per session. Do _1-2_ sessions per day.  Copyright  VHI. All rights reserved.   With the bottom cabinets open and holding on with both hands. - Place to pieces of blue (painter's) tape on inside of cabinet. - alternating tapping the tap (forward direction) with each foot. - 10 reps with each leg. - 1-2 times a day.   Perform these in a corner with a chair in front for safety and some one next to you:  Feet Apart, Head Motion - Eyes Closed    With eyes closed and feet shoulder width apart, move head slowly 1. Up and down x 10  2. Left and right x 10 reps Do _1-2_ sessions per day.  Copyright  VHI. All rights reserved.    Feet Together, Head Motion - Eyes Open    With eyes open, feet together, move head slowly:  1. Up and down x 10 reps 2. Left and right x 10 reps  Do _1-2_ sessions per day.  Copyright  VHI. All rights reserved.    Feet Partial Heel-Toe, Varied Arm Positions - Eyes Open    With eyes open, right foot partially in front of the other, arms as needed for balance, look straight ahead at a stationary object. Hold _30_ seconds.  Switch feet with left foot partially in front of the other, arms as needed for balance, look straight ahead at a stationary object. Hold for 30 seconds.   Repeat _3_ times each foot forward per session. Do _1-2_ sessions per day.  Copyright  VHI. All rights reserved.      05/20/18 1800  PT Education  Education Details results of her recent FOTO score; updated HEP for balance  Person(s) Educated Patient;Spouse  Methods Explanation;Demonstration;Verbal cues;Handout  Comprehension Verbalized understanding;Returned demonstration;Verbal cues required;Need further instruction      PT Short Term Goals - 05/18/18 1440      PT SHORT TERM GOAL #1   Title  Pt will improve DGI to >/= 10/24  without AD with min guard    Baseline  decreased to 5/24     Time  6    Period  Weeks    Status  Revised    Target Date  07/02/18      PT SHORT TERM GOAL #2   Title  Pt will decrease falls risk for limited community gait as indicated by increase in gait velocity without AD to > or = 2.62ft/sec    Baseline  decreased to 1.85 ft/sec without AD    Time  6    Period  Weeks    Status  Revised    Target Date  07/02/18      PT SHORT TERM GOAL #3   Title  Pt will decrease falls risk as indicated by increase in BERG balance score to > or = 50/56    Baseline  decreased to 48/56    Time  6    Period  Weeks    Status  Revised    Target Date  07/02/18      PT SHORT TERM GOAL #4   Title  Pt will ambulate x 230' indoors without AD and supervision    Time  6    Period  Weeks    Status  Revised    Target Date  07/02/18      PT SHORT TERM GOAL #5   Title  Pt will maintain RLE improvement in pain reporting a 1-2/10 overall, with stretching and strengthening HEP; pt will also demonstrate independence with ongoing HEP (treadmill, vestibular, balance)    Time  6    Period  Weeks    Status  New    Target Date  07/02/18        PT Long Term Goals - 05/18/18 1449      PT LONG TERM GOAL #1   Title  Pt will report ongoing improvement in RLE pain and will demonstrate independence with HEP (RLE stretching, balance, vestibular, treadmill, household chores)    Time  12    Period  Weeks    Status  Revised    Target Date  08/16/18      PT LONG TERM GOAL #2   Title  Pt will demonstrate improved balance and decreased falls risk as indicated by BERG balance score of > or = 52/56    Time  12    Period  Weeks    Status  Revised    Target Date  08/16/18      PT LONG TERM GOAL #3   Title  Pt will ambulate without AD x 250' over outdoor paved surfaces with min guard    Time  12    Period  Weeks    Status  Revised    Target Date  08/16/18      PT LONG TERM GOAL #4   Title  Pt will report  improvement in Neuro QOL-LE to >/= 40%    Baseline  need to re-assess    Time  12    Period  Weeks    Status  Revised    Target Date  08/16/18      PT LONG TERM GOAL #5   Title  Pt will decrease falls risk during gait in community as indicated by increase in gait velocity to > or = 2.6 ft/sec without AD    Time  12    Period  Weeks    Status  Revised    Target Date  08/16/18      PT LONG TERM GOAL #6   Title  Pt will demonstrate decreased falls risk with gait as indicated by increase in DGI score to >/= 15/24 without AD    Time  12    Period  Weeks    Status  Revised    Target Date  08/16/18         05/20/18 1451  Plan  Clinical Impression Statement Today's skilled session intially focused on reassessment of her FOTO score with pt scoring 51 this session. Remainder of session addressed updating of her current HEP with no issues reported with performance in session. Pt is progressing toward updated goals and should benefit from continued PT to progress toward unmet goals.   Pt will benefit from skilled therapeutic intervention in order to improve on the following deficits Abnormal gait;Decreased balance;Decreased cognition;Decreased coordination;Decreased strength;Difficulty walking;Dizziness;Impaired  sensation;Impaired vision/preception;Pain;Decreased activity tolerance;Decreased mobility;Decreased endurance;Postural dysfunction  Rehab Potential Good  PT Frequency 2x / week  PT Duration 12 weeks  PT Treatment/Interventions ADLs/Self Care Home Management;Aquatic Therapy;DME Instruction;Gait training;Stair training;Functional mobility training;Therapeutic activities;Therapeutic exercise;Balance training;Neuromuscular re-education;Patient/family education;Vestibular;Visual/perceptual remediation/compensation;Cognitive remediation;Orthotic Fit/Training;Electrical Stimulation;Cryotherapy;Moist Heat;Manual techniques;Passive range of motion;Taping  PT Next Visit Plan re-do vestibular  assessment and update HEP; WB/proprioception/weight shifting L side: sidelying, quadruped, tall kneeling, single leg sit > stand, standing in // bars with sports cord pulling R so pt weight shifts to L.  Bioness during gait and stair negotiation without AD, Bioness with treadmill  Consulted and Agree with Plan of Care Patient;Family member/caregiver  Family Member Consulted spouse       Patient will benefit from skilled therapeutic intervention in order to improve the following deficits and impairments:  Abnormal gait, Decreased balance, Decreased cognition, Decreased coordination, Decreased strength, Difficulty walking, Dizziness, Impaired sensation, Impaired vision/preception, Pain, Decreased activity tolerance, Decreased mobility, Decreased endurance, Postural dysfunction  Visit Diagnosis: Unsteadiness on feet  Ataxic gait  Other abnormalities of gait and mobility  Dizziness and giddiness     Problem List Patient Active Problem List   Diagnosis Date Noted  . 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 Weakness with dizziness-  since [facility] and CVA 04/07/17 08/07/2017  . Disturbances of vision, late effect of stroke 08/06/2017  . Health education/counseling 08/05/2017  . High risk medications (not anticoagulants) long-term use 08/05/2017  . Neuritis-  R sided:  arm and leg/ body due to stroke 08/05/2017  . Elevated LDL cholesterol level 07/11/2017  . [employer] (NIH) Stroke Scale limb ataxia score 2, ataxia present in two limbs 06/25/2017  . 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 Elevated vitamin B12 level 05/30/2017  . Vitamin D deficiency 05/29/2017  . History of tobacco abuse-  30pk yr hx - quit 04/07/17 05/21/2017  . Gait disturbance, post-stroke 05/14/2017  . Benign essential HTN   . Vitreous hemorrhage of right eye ([name])   . Adjustment disorder with mixed anxiety and depressed mood   . 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 s/p SAH (subarachnoid hemorrhage) ([name]) 04/19/2017  . Basilar artery aneurysm ([name])   . Hypoxia   . 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 CVA (cerebral vascular accident) ([name]) 04/07/2017  . Elevated gastrin level 01/25/2012  . Hypokalemia 01/21/2012  . Nausea & vomiting 01/20/2012  . Epigastric pain 01/20/2012  . Duodenal ulcer, acute with obstruction 10/17/2011  . S/P laparoscopic cholecystectomy 10/16/2011    [name], PTA, [facility] [address], [address] [place], [place] [address] [phone] 05/22/18, 9:05 AM   Name: [name] Barnett MRN: [id number] Date of Birth: 16-Jun-1976

## 2018-05-23 ENCOUNTER — Telehealth: Payer: Self-pay | Admitting: Family Medicine

## 2018-05-23 ENCOUNTER — Other Ambulatory Visit: Payer: Self-pay

## 2018-05-23 DIAGNOSIS — I10 Essential (primary) hypertension: Secondary | ICD-10-CM

## 2018-05-23 MED ORDER — AMLODIPINE BESYLATE 5 MG PO TABS
5.0000 mg | ORAL_TABLET | Freq: Every day | ORAL | 1 refills | Status: DC
Start: 2018-05-23 — End: 2018-11-09

## 2018-05-23 NOTE — Telephone Encounter (Signed)
Patient called states Dr. Val Eagle hand wrote on her AVS that she was to take BP meds & Rx would [facility] sent [facility] -- unable to locate BP prescription in meds list & Pharmacy states no Rx on file for BP medication.   ------- Forwarding message to medical assistant to review .  Patient uses :  Preferred Pharmacies      [id number]- [place], [place] - [address] [phone] (Phone) [phone] (Fa   ---glh

## 2018-05-23 NOTE — Telephone Encounter (Signed)
Sent refill in for medication, called patient to notify - left message. MPulliam, CMA/RT(R)

## 2018-05-23 NOTE — Telephone Encounter (Signed)
Refill on amlodpine 5 mg. MPulliam, CMA/RT(R)

## 2018-06-04 ENCOUNTER — Encounter: Payer: Self-pay | Admitting: Physical Therapy

## 2018-06-04 ENCOUNTER — Ambulatory Visit: Payer: BLUE CROSS/BLUE SHIELD | Admitting: Physical Therapy

## 2018-06-04 DIAGNOSIS — R42 Dizziness and giddiness: Secondary | ICD-10-CM

## 2018-06-04 DIAGNOSIS — R2681 Unsteadiness on feet: Secondary | ICD-10-CM

## 2018-06-04 DIAGNOSIS — R260 Ataxic gait: Secondary | ICD-10-CM | POA: Diagnosis not present

## 2018-06-04 NOTE — Patient Instructions (Addendum)
Sit to Side-Lying    Sit on edge of bed. 1. Turn head 45 to right. 2. Maintain head position and lie down slowly on left side. Hold until symptoms subside. 3. Sit up slowly. Hold until symptoms subside. 4. Turn head 45 to left. 5. Maintain head position and lie down slowly on right side. Hold until symptoms subside. 6. Sit up slowly. Repeat sequence ___5_ times per session. Do __2__ sessions per day.  Copyright  VHI. All rights reserved.    Gaze Stabilization: Tip Card  1.Target must remain in focus, not blurry, and appear stationary while head is in motion. 2.Perform exercises with small head movements (45 to either side of midline). 3.Increase speed of head motion so long as target is in focus. 4.If you wear eyeglasses, be sure you can see target through lens (therapist will give specific instructions for bifocal / progressive lenses). 5.These exercises may provoke dizziness or nausea. Work through these symptoms. If too dizzy, slow head movement slightly. Rest between each exercise. 6.Exercises demand concentration; avoid distractions. 7.For safety, perform standing exercises close to a counter, wall, corner, or next to someone.  Copyright  VHI. All rights reserved.   Gaze Stabilization: Sitting    Keeping eyes on target on wall 10N feet away, tilt head down 15-30 and move head side to side for __10__ seconds. Repeat while moving head up and down for __10__ seconds. Do __2__ sessions per day.  Copyright  VHI. All rights reserved.    Oculomotor: Saccades    Holding two targets positioned side by side _6_ inches apart, move eyes quickly from target to target as head stays still.  Holding two targets positioned up and down about 6 inches apart, move eyes quickly from target to target as head stays still.   Perform sitting.  Repeat __10__ times per session. Do __2__ sessions per day.  Copyright  VHI. All rights reserved.

## 2018-06-05 ENCOUNTER — Ambulatory Visit: Payer: BLUE CROSS/BLUE SHIELD | Admitting: Physical Therapy

## 2018-06-05 NOTE — Therapy (Signed)
[facility] [address] [place], [place], [address] Phone: [phone]   Fax:  [phone]  Physical Therapy Treatment  Patient Details  Name: [name] Barnett MRN: [id number] Date of Birth: 07/04/1976 Referring Provider: Erick Colace, MD   Encounter Date: 06/04/2018  PT End of Session - 06/04/18 1453    Visit Number  73    Number of Visits  95 per recertification    Date for PT Re-Evaluation  08/16/18 12 week recertification    Authorization Type  BCBS    PT Start Time  1449    PT Stop Time  1530    PT Time Calculation (min)  41 min    Equipment Utilized During Treatment  Gait belt    Activity Tolerance  Patient tolerated treatment well;No increased pain    Behavior During Therapy  WFL for tasks assessed/performed       Past Medical History:  Diagnosis Date  . Duodenal obstruction   . GERD (gastroesophageal reflux disease)   . Hypertension   . Stroke [facility])    april 2018    Past Surgical History:  Procedure Laterality Date  . ABDOMINAL HYSTERECTOMY    . BALLOON DILATION  12/31/2011   Procedure: BALLOON DILATION;  Surgeon: Barrie Folk, MD;  Location: [facility] ENDOSCOPY;  Service: Endoscopy;  Laterality: N/A;  . CHOLECYSTECTOMY  07/28/11  . ESOPHAGOGASTRODUODENOSCOPY  10/17/2011   Procedure: ESOPHAGOGASTRODUODENOSCOPY (EGD);  Surgeon: Barrie Folk, MD;  Location: [facility] ENDOSCOPY;  Service: Endoscopy;  Laterality: N/A;  . IR ANGIO INTRA EXTRACRAN SEL INTERNAL CAROTID BILAT MOD SED  04/07/2017  . IR ANGIO INTRA EXTRACRAN SEL INTERNAL CAROTID BILAT MOD SED  01/07/2018  . IR ANGIO VERTEBRAL SEL VERTEBRAL UNI R MOD SED  04/07/2017  . IR ANGIO VERTEBRAL SEL VERTEBRAL UNI R MOD SED  01/07/2018  . IR ANGIOGRAM FOLLOW UP STUDY  04/07/2017  . IR ANGIOGRAM FOLLOW UP STUDY  04/07/2017  . IR ANGIOGRAM FOLLOW UP STUDY  04/07/2017  . IR ANGIOGRAM FOLLOW UP STUDY  04/07/2017  . IR ANGIOGRAM FOLLOW UP STUDY  04/07/2017  . IR ANGIOGRAM  SELECTIVE EACH ADDITIONAL VESSEL  04/07/2017  . IR TRANSCATH/EMBOLIZ  04/07/2017  . IR US GUIDE VASC ACCESS RIGHT  01/07/2018  . PARS PLANA VITRECTOMY Right 05/01/2017   Procedure: PARS PLANA VITRECTOMY WITH 25 GAUGE RIGHT EYE, endolaser photocoaglation;  Surgeon: Carmela Rima, MD;  Location: [facility] OR;  Service: Ophthalmology;  Laterality: Right;  . RADIOLOGY WITH ANESTHESIA N/A 04/07/2017   Procedure: RADIOLOGY WITH ANESTHESIA;  Surgeon: Lisbeth Renshaw, MD;  Location: [facility] OR;  Service: Radiology;  Laterality: N/A;  . REPLACEMENT TOTAL KNEE BILATERAL  2004  . TEMPOROMANDIBULAR JOINT SURGERY     2 surgeries  . TUMOR REMOVAL    . VAGOTOMY  01/23/2012   Procedure: VAGOTOMY, antrectomy and BII;  Surgeon: Currie Paris, MD;  Location: [facility] OR;  Service: General;  Laterality: N/A;  Laparotomy with vagotomy.    There were no vitals filed for this visit.  Subjective Assessment - 06/04/18 1452    Subjective  No new complaints. No falls. Hip is doing okay, having some GI issues    Patient is accompained by:  Family member    Limitations  Standing;Walking    Patient Stated Goals  To walk safely without walker, improve balance so no more falls.    Currently in Pain?  No/denies    Pain Score  0-No pain  Vestibular Assessment - 06/04/18 1731      Symptom Behavior   Type of Dizziness  "Funny feeling in head"    Frequency of Dizziness  occassionally at random times- about 1-2 days a week    Duration of Dizziness  constant    Aggravating Factors  Mornings;Supine to sit;Turning head sideways    Relieving Factors  Comments;Lying supine;Closing eyes;Rest sitting fully supported      Positional Testing   Sidelying Test  Sidelying Right;Sidelying Left      Sidelying Right   Sidelying Right Duration  20 seconds    Sidelying Right Symptoms  No nystagmus      Sidelying Left   Sidelying Left Duration  none    Sidelying Left Symptoms  No nystagmus         Vestibular Treatment/Exercise  - 06/05/18 0001      Vestibular Treatment/Exercise   Habituation Exercises  Austin Miles    Gaze Exercises  X1 Viewing Horizontal;X1 Viewing Vertical      Austin Miles   Number of Reps   3    Symptom Description   decreased symptoms and decreased duration with each consecutive rep      X1 Viewing Horizontal   Foot Position  seated on mat table with feet on floor    Reps  2    Comments  30 seconds each. decreased to no symptoms with second rep.       X1 Viewing Vertical   Foot Position  seated on mat with feet on floor    Reps  2    Comments  30 seceonds each. decreased to no symptoms with second rep            PT Education - 06/04/18 1730    Education provided  Yes    Education Details  re-issued x1 seated ex's and Ethel Rana to address ongoing occurance of dizziness    Person(s) Educated  Patient;Parent(s) mom present today    Methods  Explanation;Demonstration;Verbal cues;Handout    Comprehension  Verbalized understanding;Returned demonstration;Verbal cues required;Need further instruction       PT Short Term Goals - 05/18/18 1440      PT SHORT TERM GOAL #1   Title  Pt will improve DGI to >/= 10/24 without AD with min guard    Baseline  decreased to 5/24     Time  6    Period  Weeks    Status  Revised    Target Date  07/02/18      PT SHORT TERM GOAL #2   Title  Pt will decrease falls risk for limited community gait as indicated by increase in gait velocity without AD to > or = 2.67ft/sec    Baseline  decreased to 1.85 ft/sec without AD    Time  6    Period  Weeks    Status  Revised    Target Date  07/02/18      PT SHORT TERM GOAL #3   Title  Pt will decrease falls risk as indicated by increase in BERG balance score to > or = 50/56    Baseline  decreased to 48/56    Time  6    Period  Weeks    Status  Revised    Target Date  07/02/18      PT SHORT TERM GOAL #4   Title  Pt will ambulate x 230' indoors without AD and supervision    Time  6  Period  Weeks    Status  Revised    Target Date  07/02/18      PT SHORT TERM GOAL #5   Title  Pt will maintain RLE improvement in pain reporting a 1-2/10 overall, with stretching and strengthening HEP; pt will also demonstrate independence with ongoing HEP (treadmill, vestibular, balance)    Time  6    Period  Weeks    Status  New    Target Date  07/02/18        PT Long Term Goals - 05/18/18 1449      PT LONG TERM GOAL #1   Title  Pt will report ongoing improvement in RLE pain and will demonstrate independence with HEP (RLE stretching, balance, vestibular, treadmill, household chores)    Time  12    Period  Weeks    Status  Revised    Target Date  08/16/18      PT LONG TERM GOAL #2   Title  Pt will demonstrate improved balance and decreased falls risk as indicated by BERG balance score of > or = 52/56    Time  12    Period  Weeks    Status  Revised    Target Date  08/16/18      PT LONG TERM GOAL #3   Title  Pt will ambulate without AD x 250' over outdoor paved surfaces with min guard    Time  12    Period  Weeks    Status  Revised    Target Date  08/16/18      PT LONG TERM GOAL #4   Title  Pt will report improvement in Neuro QOL-LE to >/= 40%    Baseline  need to re-assess    Time  12    Period  Weeks    Status  Revised    Target Date  08/16/18      PT LONG TERM GOAL #5   Title  Pt will decrease falls risk during gait in community as indicated by increase in gait velocity to > or = 2.6 ft/sec without AD    Time  12    Period  Weeks    Status  Revised    Target Date  08/16/18      PT LONG TERM GOAL #6   Title  Pt will demonstrate decreased falls risk with gait as indicated by increase in DGI score to >/= 15/24 without AD    Time  12    Period  Weeks    Status  Revised    Target Date  08/16/18            Plan - 06/04/18 1453    Clinical Impression Statement  Pt arrived to today's session with reports of increased dizziness, 8/10. Session focused on  brief recheck for positional cause with negative findings. Discussed with primary PT who does agreee and feels this is more of a central vestibular issue. Pt also reports increased visual issues over the past few weeks and does have an appt with her neuro opthamologist. This too could be contributing to her re-occuring/random diszziness. Today's session focused on habituation and x1 ex's to address dizziness with them being reissued to HEP as pt has not been doing them for awhile now at home. Pt should benefit from continued PT to progress toward unmet goals.  Rehab Potential  Good    PT Frequency  2x / week    PT Duration  12 weeks    PT Treatment/Interventions  ADLs/Self Care Home Management;Aquatic Therapy;DME Instruction;Gait training;Stair training;Functional mobility training;Therapeutic activities;Therapeutic exercise;Balance training;Neuromuscular re-education;Patient/family education;Vestibular;Visual/perceptual remediation/compensation;Cognitive remediation;Orthotic Fit/Training;Electrical Stimulation;Cryotherapy;Moist Heat;Manual techniques;Passive range of motion;Taping    PT Next Visit Plan  continue to address dizziness with habituation as needed, progress x1 to x2 ex's as able; if not dizzy: work on yoga poses in standing to address strengtheing/balance, gait on treadmill with pt self propelling belt, gait with no AD with resistance to increase weight shift to left/improve posture, use of Bioness with gait and balance/strengthening ex's. also needs continued work on core strengthening and overall proprioception     Consulted and Agree with Plan of Care  Patient;Family member/caregiver    Family Member Consulted  spouse       Patient will benefit from skilled therapeutic intervention in order to improve the following deficits and impairments:  Abnormal gait, Decreased balance, Decreased cognition, Decreased coordination, Decreased strength, Difficulty walking,  Dizziness, Impaired sensation, Impaired vision/preception, Pain, Decreased activity tolerance, Decreased mobility, Decreased endurance, Postural dysfunction  Visit Diagnosis: Dizziness and giddiness  Unsteadiness on feet     Problem List Patient Active Problem List   Diagnosis Date Noted  . 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 Weakness with dizziness-  since [facility] and CVA 04/07/17 08/07/2017  . Disturbances of vision, late effect of stroke 08/06/2017  . Health education/counseling 08/05/2017  . High risk medications (not anticoagulants) long-term use 08/05/2017  . Neuritis-  R sided:  arm and leg/ body due to stroke 08/05/2017  . Elevated LDL cholesterol level 07/11/2017  . [employer] (NIH) Stroke Scale limb ataxia score 2, ataxia present in two limbs 06/25/2017  . 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 Elevated vitamin B12 level 05/30/2017  . Vitamin D deficiency 05/29/2017  . History of tobacco abuse-  30pk yr hx - quit 04/07/17 05/21/2017  . Gait disturbance, post-stroke 05/14/2017  . Benign essential HTN   . Vitreous hemorrhage of right eye ([name])   . Adjustment disorder with mixed anxiety and depressed mood   . 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 s/p SAH (subarachnoid hemorrhage) ([name]) 04/19/2017  . Basilar artery aneurysm ([name])   . Hypoxia   . 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 CVA (cerebral vascular accident) ([name]) 04/07/2017  . Elevated gastrin level 01/25/2012  . Hypokalemia 01/21/2012  . Nausea & vomiting 01/20/2012  . Epigastric pain 01/20/2012  . Duodenal ulcer, acute with obstruction 10/17/2011  . S/P laparoscopic cholecystectomy 10/16/2011    [name], PTA, [facility] [address] [place], [place] [address] [phone] 06/05/18, 5:43 PM   Name: [name] Barnett MRN: [id number] Date  of Birth: 1976/07/03

## 2018-06-06 ENCOUNTER — Encounter: Payer: Self-pay | Admitting: Rehabilitation

## 2018-06-06 ENCOUNTER — Ambulatory Visit: Payer: BLUE CROSS/BLUE SHIELD | Admitting: Rehabilitation

## 2018-06-06 DIAGNOSIS — R260 Ataxic gait: Secondary | ICD-10-CM | POA: Diagnosis not present

## 2018-06-06 DIAGNOSIS — R2689 Other abnormalities of gait and mobility: Secondary | ICD-10-CM

## 2018-06-06 DIAGNOSIS — R2681 Unsteadiness on feet: Secondary | ICD-10-CM

## 2018-06-06 DIAGNOSIS — R278 Other lack of coordination: Secondary | ICD-10-CM

## 2018-06-07 NOTE — Therapy (Signed)
[facility] [address] [place], [place], [address] Phone: [phone]   Fax:  [phone]  Physical Therapy Treatment  Patient Details  Name: [name] Barnett F Riester MRN: [id number] Date of Birth: 01/24/1976 Referring Provider: Erick ColaceAndrew E Kirsteins, MD   Encounter Date: 06/06/2018  PT End of Session - 06/06/18 1454    Visit Number  74    Number of Visits  95 per recertification    Date for PT Re-Evaluation  08/16/18 12 week recertification    Authorization Type  BCBS    PT Start Time  1447    PT Stop Time  1530    PT Time Calculation (min)  43 min    Equipment Utilized During Treatment  Gait belt    Activity Tolerance  Patient tolerated treatment well;No increased pain    Behavior During Therapy  WFL for tasks assessed/performed       Past Medical History:  Diagnosis Date  . Duodenal obstruction   . GERD (gastroesophageal reflux disease)   . Hypertension   . Stroke [facility]([name])    april 2018    Past Surgical History:  Procedure Laterality Date  . ABDOMINAL HYSTERECTOMY    . BALLOON DILATION  12/31/2011   Procedure: BALLOON DILATION;  Surgeon: Barrie FolkJohn C Hayes, MD;  [facility];  Service: Endoscopy;  Laterality: N/A;  . CHOLECYSTECTOMY  07/28/11  . ESOPHAGOGASTRODUODENOSCOPY  10/17/2011   Procedure: ESOPHAGOGASTRODUODENOSCOPY (EGD);  Surgeon: Barrie FolkJohn C Hayes, MD;  [facility];  Service: Endoscopy;  Laterality: N/A;  . IR ANGIO INTRA EXTRACRAN SEL INTERNAL CAROTID BILAT MOD SED  04/07/2017  . IR ANGIO INTRA EXTRACRAN SEL INTERNAL CAROTID BILAT MOD SED  01/07/2018  . IR ANGIO VERTEBRAL SEL VERTEBRAL UNI R MOD SED  04/07/2017  . IR ANGIO VERTEBRAL SEL VERTEBRAL UNI R MOD SED  01/07/2018  . IR ANGIOGRAM FOLLOW UP STUDY  04/07/2017  . IR ANGIOGRAM FOLLOW UP STUDY  04/07/2017  . IR ANGIOGRAM FOLLOW UP STUDY  04/07/2017  . IR ANGIOGRAM FOLLOW UP STUDY  04/07/2017  . IR ANGIOGRAM FOLLOW UP STUDY  04/07/2017  . IR ANGIOGRAM  SELECTIVE EACH ADDITIONAL VESSEL  04/07/2017  . IR TRANSCATH/EMBOLIZ  04/07/2017  . IR US GUIDE VASC ACCESS RIGHT  01/07/2018  . PARS PLANA VITRECTOMY Right 05/01/2017   Procedure: PARS PLANA VITRECTOMY WITH 25 GAUGE RIGHT EYE, endolaser photocoaglation;  Surgeon: Carmela RimaPatel, Narendra, MD;  [facility];  Service: Ophthalmology;  Laterality: Right;  . RADIOLOGY WITH ANESTHESIA N/A 04/07/2017   Procedure: RADIOLOGY WITH ANESTHESIA;  Surgeon: [name], MD;  [facility];  Service: Radiology;  Laterality: N/A;  . REPLACEMENT TOTAL KNEE BILATERAL  2004  . TEMPOROMANDIBULAR JOINT SURGERY     2 surgeries  . TUMOR REMOVAL    . VAGOTOMY  01/23/2012   Procedure: VAGOTOMY, antrectomy and BII;  Surgeon: [name], MD;  Location: [facility] OR;  Service: General;  Laterality: N/A;  Laparotomy with vagotomy.    There were no vitals filed for this visit.  Subjective Assessment - 06/06/18 1453    Subjective  Pt reports continued groin pain, however has improved greatly from injection.     Patient is accompained by:  Family member    Limitations  Standing;Walking    Patient Stated Goals  To walk safely without walker, improve balance so no more falls.    Currently in Pain?  Yes    Pain Score  1  [facility] Adult PT Treatment/Exercise - 06/06/18 1450      Ambulation/Gait   Ambulation/Gait  Yes    Ambulation/Gait Assistance  4: Min assist    Ambulation/Gait Assistance Details  Assessed gait for carryover of NMR at end of session without device.  Pt demonstrates marked improvement in control of gait with decreased ataxia, decreased trunk rotation and more forward movement.  Education to carry this over to gait with RW.  Note that pt did seem more in control when leaving clinic with RW.      Ambulation Distance (Feet)  100 Feet    Assistive device  None    Gait Pattern  Step-through pattern;Decreased stance time - left;Decreased stride length;Decreased weight shift  to left;Ataxic    Ambulation Surface  Level;Indoor      Neuro Re-ed    Neuro Re-ed Details   Utilized Dentist for limits of stability to address more slow and controlled weight shifts along with improved proprioceptive feedback on where maintaining center WB is.  Performed center to R, center to L, center to R anterior and center to L anterior weight shifts during session all x 2 mins with intermittent light min A to guide movement. Pt frustrated at times, but with increased practice demonstrated very controlled and smooth weight shifts.  Transitioned to NMR task in // bars to work on lateral weight shifts with cone taps.  Pt does well with R lateral weight shift and also demonstrates good control in LLE when tapping cone, however has marked difficulty with L lateral weight shift and maintaining it for R cone tap.  Performed each x 5 reps and another 5 reps with L lateral weight shift only with mod/max to min A to maintain L lateral weight shift.  Again, pt with increased frustration, but with repetition did markedly better than when first beginning task.               PT Education - 06/06/18 1454    Education provided  Yes    Education Details  rationale for use of Training and development officer) Educated  Patient    Methods  Explanation    Comprehension  Verbalized understanding       PT Short Term Goals - 05/18/18 1440      PT SHORT TERM GOAL #1   Title  Pt will improve DGI to >/= 10/24 without AD with min guard    Baseline  decreased to 5/24     Time  6    Period  Weeks    Status  Revised    Target Date  07/02/18      PT SHORT TERM GOAL #2   Title  Pt will decrease falls risk for limited community gait as indicated by increase in gait velocity without AD to > or = 2.27ft/sec    Baseline  decreased to 1.85 ft/sec without AD    Time  6    Period  Weeks    Status  Revised    Target Date  07/02/18      PT SHORT TERM GOAL #3   Title  Pt will decrease falls risk as indicated  by increase in BERG balance score to > or = 50/56    Baseline  decreased to 48/56    Time  6    Period  Weeks    Status  Revised    Target Date  07/02/18      PT SHORT TERM GOAL #4  Title  Pt will ambulate x 230' indoors without AD and supervision    Time  6    Period  Weeks    Status  Revised    Target Date  07/02/18      PT SHORT TERM GOAL #5   Title  Pt will maintain RLE improvement in pain reporting a 1-2/10 overall, with stretching and strengthening HEP; pt will also demonstrate independence with ongoing HEP (treadmill, vestibular, balance)    Time  6    Period  Weeks    Status  New    Target Date  07/02/18        PT Long Term Goals - 05/18/18 1449      PT LONG TERM GOAL #1   Title  Pt will report ongoing improvement in RLE pain and will demonstrate independence with HEP (RLE stretching, balance, vestibular, treadmill, household chores)    Time  12    Period  Weeks    Status  Revised    Target Date  08/16/18      PT LONG TERM GOAL #2   Title  Pt will demonstrate improved balance and decreased falls risk as indicated by BERG balance score of > or = 52/56    Time  12    Period  Weeks    Status  Revised    Target Date  08/16/18      PT LONG TERM GOAL #3   Title  Pt will ambulate without AD x 250' over outdoor paved surfaces with min guard    Time  12    Period  Weeks    Status  Revised    Target Date  08/16/18      PT LONG TERM GOAL #4   Title  Pt will report improvement in Neuro QOL-LE to >/= 40%    Baseline  need to re-assess    Time  12    Period  Weeks    Status  Revised    Target Date  08/16/18      PT LONG TERM GOAL #5   Title  Pt will decrease falls risk during gait in community as indicated by increase in gait velocity to > or = 2.6 ft/sec without AD    Time  12    Period  Weeks    Status  Revised    Target Date  08/16/18      PT LONG TERM GOAL #6   Title  Pt will demonstrate decreased falls risk with gait as indicated by increase in DGI score  to >/= 15/24 without AD    Time  12    Period  Weeks    Status  Revised    Target Date  08/16/18            Plan - 06/07/18 0741    Clinical Impression Statement  Skilled session focused on performing limits of stability in balance master, carrying over to NMR tasks in // bars then to gait for more controlled weight shifts and improved proprioceptive feedback on where midline WB actually is as she tends to keep weight shifted posteriorly.      Rehab Potential  Good    PT Frequency  2x / week    PT Duration  12 weeks    PT Treatment/Interventions  ADLs/Self Care Home Management;Aquatic Therapy;DME Instruction;Gait training;Stair training;Functional mobility training;Therapeutic activities;Therapeutic exercise;Balance training;Neuromuscular re-education;Patient/family education;Vestibular;Visual/perceptual remediation/compensation;Cognitive remediation;Orthotic Fit/Training;Electrical Stimulation;Cryotherapy;Moist Heat;Manual techniques;Passive range of motion;Taping    PT Next Visit Plan  continue to address dizziness with habituation as needed, progress x1 to x2 ex's as able; if not dizzy: work on yoga poses in standing to address strengtheing/balance, gait on treadmill with pt self propelling belt, gait with no AD with resistance to increase weight shift to left/improve posture, use of Bioness with gait and balance/strengthening ex's. also needs continued work on core strengthening and overall proprioception     Consulted and Agree with Plan of Care  Patient;Family member/caregiver    Family Member Consulted  spouse       Patient will benefit from skilled therapeutic intervention in order to improve the following deficits and impairments:  Abnormal gait, Decreased balance, Decreased cognition, Decreased coordination, Decreased strength, Difficulty walking, Dizziness, Impaired sensation, Impaired vision/preception, Pain, Decreased activity tolerance, Decreased mobility, Decreased endurance,  Postural dysfunction  Visit Diagnosis: Unsteadiness on feet  Ataxic gait  Other abnormalities of gait and mobility  Other lack of coordination     Problem List Patient Active Problem List   Diagnosis Date Noted  . 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 Weakness with dizziness-  since [facility] and CVA 04/07/17 08/07/2017  . Disturbances of vision, late effect of stroke 08/06/2017  . Health education/counseling 08/05/2017  . High risk medications (not anticoagulants) long-term use 08/05/2017  . Neuritis-  R sided:  arm and leg/ body due to stroke 08/05/2017  . Elevated LDL cholesterol level 07/11/2017  . [employer] (NIH) Stroke Scale limb ataxia score 2, ataxia present in two limbs 06/25/2017  . 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 Elevated vitamin B12 level 05/30/2017  . Vitamin D deficiency 05/29/2017  . History of tobacco abuse-  30pk yr hx - quit 04/07/17 05/21/2017  . Gait disturbance, post-stroke 05/14/2017  . Benign essential HTN   . Vitreous hemorrhage of right eye ([name])   . Adjustment disorder with mixed anxiety and depressed mood   . 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 s/p SAH (subarachnoid hemorrhage) ([name]) 04/19/2017  . Basilar artery aneurysm ([name])   . Hypoxia   . 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 CVA (cerebral vascular accident) ([name]) 04/07/2017  . Elevated gastrin level 01/25/2012  . Hypokalemia 01/21/2012  . Nausea & vomiting 01/20/2012  . Epigastric pain 01/20/2012  . Duodenal ulcer, acute with obstruction 10/17/2011  . S/P laparoscopic cholecystectomy 10/16/2011    Harriet Butte, PT, MPT [facility] [address] [place], [place], [address] Phone: [phone]   Fax:  [phone] 06/07/18, 7:43 AM  Name: ARALYNN BRAKE MRN: [id number] Date of Birth: 19-Dec-1975

## 2018-06-10 ENCOUNTER — Ambulatory Visit: Payer: BLUE CROSS/BLUE SHIELD | Admitting: Physical Medicine & Rehabilitation

## 2018-06-16 ENCOUNTER — Encounter: Payer: Self-pay | Admitting: Physical Medicine & Rehabilitation

## 2018-06-16 ENCOUNTER — Encounter: Payer: BLUE CROSS/BLUE SHIELD | Attending: Physical Medicine & Rehabilitation

## 2018-06-16 ENCOUNTER — Ambulatory Visit (HOSPITAL_BASED_OUTPATIENT_CLINIC_OR_DEPARTMENT_OTHER): Payer: BLUE CROSS/BLUE SHIELD | Admitting: Physical Medicine & Rehabilitation

## 2018-06-16 VITALS — BP 142/87 | HR 73 | Resp 14 | Ht 64.0 in | Wt 170.0 lb

## 2018-06-16 DIAGNOSIS — Z789 Other specified health status: Secondary | ICD-10-CM

## 2018-06-16 DIAGNOSIS — I69398 Other sequelae of cerebral infarction: Secondary | ICD-10-CM

## 2018-06-16 DIAGNOSIS — I725 Aneurysm of other precerebral arteries: Secondary | ICD-10-CM | POA: Diagnosis present

## 2018-06-16 DIAGNOSIS — I69093 Ataxia following nontraumatic subarachnoid hemorrhage: Secondary | ICD-10-CM | POA: Insufficient documentation

## 2018-06-16 DIAGNOSIS — M1611 Unilateral primary osteoarthritis, right hip: Secondary | ICD-10-CM

## 2018-06-16 DIAGNOSIS — R42 Dizziness and giddiness: Secondary | ICD-10-CM | POA: Insufficient documentation

## 2018-06-16 DIAGNOSIS — R209 Unspecified disturbances of skin sensation: Secondary | ICD-10-CM | POA: Diagnosis not present

## 2018-06-16 DIAGNOSIS — H539 Unspecified visual disturbance: Secondary | ICD-10-CM

## 2018-06-16 NOTE — Progress Notes (Signed)
Subjective:    Patient ID: [name] Barnett, female    DOB: 09/21/1976, 42 y.o.   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 Right hip injection intra articular injection  Saw the ophthalmologist Dr Dione BoozeGroat,  Double vision continues, visual acuity however is good according to patient.  Right hip pain much improved after right hip intra-articular injection.  Pain Inventory Average Pain 1 Pain Right Now 1 My pain is intermittent  In the last 24 hours, has pain interfered with the following? General activity 1 Relation with others 1 Enjoyment of life 1 What TIME of day is your pain at its worst? morning Sleep (in general) NA  Pain is worse with: sitting Pain improves with: injections Relief from Meds: excellent relief with hip injection fluoro guided  Mobility use a walker  Function disabled: date disabled April 07, 2017  Neuro/Psych weakness numbness tingling trouble walking  Prior Studies Improved after Right hip injection  Physicians involved in your care Neurologist Dr Patric DykesNunkumar   Family History  Problem Relation Age of Onset  . Hypertension Mother   . Restless legs syndrome Mother   . Hyperlipidemia Mother   . Heart disease Father   . Malignant hyperthermia Neg Hx    Social History   Socioeconomic History  . Marital status: Married    Spouse name: Not on file  . Number of children: Not on file  . Years of education: Not on file  . Highest education level: Not on file  Occupational History  . Not on file  Social Needs  . Financial resource strain: Not on file  . Food insecurity:    Worry: Not on file    Inability: Not on file  . Transportation needs:    Medical: Not on file    Non-medical: Not on file  Tobacco Use  . Smoking status: Former Smoker    Packs/day: 1.00    Years: 20.00    Pack years: 20.00    Types:  Cigarettes    Last attempt to quit: 04/07/2017    Years since quitting: 1.1  . Smokeless tobacco: Never Used  Substance and Sexual Activity  . Alcohol use: No  . Drug use: No  . Sexual activity: Not Currently    Partners: Male  Lifestyle  . Physical activity:    Days per week: Not on file    Minutes per session: Not on file  . Stress: Not on file  Relationships  . Social connections:    Talks on phone: Not on file    Gets together: Not on file    Attends religious service: Not on file    Active member of club or organization: Not on file    Attends meetings of clubs or organizations: Not on file    Relationship status: Not on file  Other Topics Concern  . Not on file  Social History Narrative   [place]. 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   Past Surgical History:  Procedure Laterality Date  . ABDOMINAL HYSTERECTOMY    . BALLOON DILATION  12/31/2011   Procedure: BALLOON DILATION;  Surgeon: Barrie FolkJohn C Hayes, MD;  [facility];  Service: Endoscopy;  Laterality: N/A;  . CHOLECYSTECTOMY  07/28/11  . ESOPHAGOGASTRODUODENOSCOPY  10/17/2011   Procedure: ESOPHAGOGASTRODUODENOSCOPY (EGD);  Surgeon: Barrie FolkJohn C Hayes, MD;  [facility];  Service: Endoscopy;  Laterality: N/A;  . IR ANGIO INTRA EXTRACRAN SEL INTERNAL CAROTID BILAT MOD SED  04/07/2017  . IR ANGIO INTRA EXTRACRAN SEL INTERNAL CAROTID BILAT MOD SED  01/07/2018  . IR ANGIO VERTEBRAL SEL VERTEBRAL UNI R MOD SED  04/07/2017  . IR ANGIO VERTEBRAL SEL VERTEBRAL UNI R MOD SED  01/07/2018  . IR ANGIOGRAM FOLLOW UP STUDY  04/07/2017  . IR ANGIOGRAM FOLLOW UP STUDY  04/07/2017  . IR ANGIOGRAM FOLLOW UP STUDY  04/07/2017  . IR ANGIOGRAM FOLLOW UP STUDY  04/07/2017  . IR ANGIOGRAM FOLLOW UP STUDY  04/07/2017  . IR ANGIOGRAM SELECTIVE EACH ADDITIONAL VESSEL  04/07/2017  . IR TRANSCATH/EMBOLIZ  04/07/2017  . IR US GUIDE VASC ACCESS RIGHT  01/07/2018  . PARS PLANA VITRECTOMY Right 05/01/2017   Procedure: PARS  PLANA VITRECTOMY WITH 25 GAUGE RIGHT EYE, endolaser photocoaglation;  Surgeon: Carmela Rima, MD;  Location: [facility] OR;  Service: Ophthalmology;  Laterality: Right;  . RADIOLOGY WITH ANESTHESIA N/A 04/07/2017   Procedure: RADIOLOGY WITH ANESTHESIA;  Surgeon: Lisbeth Renshaw, MD;  Location: [facility] OR;  Service: Radiology;  Laterality: N/A;  . REPLACEMENT TOTAL KNEE BILATERAL  2004  . TEMPOROMANDIBULAR JOINT SURGERY     2 surgeries  . TUMOR REMOVAL    . VAGOTOMY  01/23/2012   Procedure: VAGOTOMY, antrectomy and BII;  Surgeon: Currie Paris, MD;  Location: [facility] OR;  Service: General;  Laterality: N/A;  Laparotomy with vagotomy.   Past Medical History:  Diagnosis Date  . Duodenal obstruction   . GERD (gastroesophageal reflux disease)   . Hypertension   . Stroke [facility])    april 2018   There were no vitals taken for this visit.  Opioid Risk Score:   Fall Risk Score:  `1  Depression screen PHQ 2/9  Depression screen [facility] 2/9 05/21/2018 05/21/2018 02/17/2018 01/13/2018 08/01/2017 07/11/2017 05/14/2017  Decreased Interest 0 0 2 1 0 2 3  Down, Depressed, Hopeless 0 0 1 0 0 1 2  PHQ - 2 Score 0 0 3 1 0 3 5  Altered sleeping 1 - 0 0 0 0 3  Tired, decreased energy 1 - 2 3 1 3 3   Change in appetite 1 - 0 0 0 0 1  Feeling bad or failure about yourself  0 - 0 0 0 0 1  Trouble concentrating 0 - 0 0 0 1 2  Moving slowly or fidgety/restless 0 - 0 0 0 3 2  Suicidal thoughts 0 - 0 0 0 1 0  PHQ-9 Score 3 - 5 4 1 11 17   Difficult doing work/chores Not difficult at all - Not difficult at all Somewhat difficult - - Very difficult  Some recent data might be hidden    Review of Systems     Objective:   Physical Exam  Constitutional: She appears well-developed and well-nourished. No distress.  HENT:  Head: Normocephalic and atraumatic.  Eyes: Pupils are equal, round, and reactive to light. EOM are normal.  Neck: Normal range of motion.  Skin: She is not diaphoretic.  Nursing note and vitals  reviewed.  Pain with right hip internal and external rotation. No pain with left hip internal/external rotation No pain with knee flexion extension passively Sensation is reduced to pinprick right hemifacial, right upper and right lower limb. No obvious extraocular muscle weakness. Moderate ataxia left finger-nose-finger and left heel-to-shin.   Ambulates with a walker no evidence of toe drag some problems with left foot placement  Assessment & Plan:  #1.  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  Right hip pain osteoarthritis improved after injection.  Will repeat in 2 months

## 2018-06-17 ENCOUNTER — Encounter: Payer: Self-pay | Admitting: Physical Therapy

## 2018-06-17 ENCOUNTER — Ambulatory Visit: Payer: BLUE CROSS/BLUE SHIELD | Attending: Physical Medicine & Rehabilitation | Admitting: Physical Therapy

## 2018-06-17 DIAGNOSIS — R278 Other lack of coordination: Secondary | ICD-10-CM

## 2018-06-17 DIAGNOSIS — R2681 Unsteadiness on feet: Secondary | ICD-10-CM

## 2018-06-17 DIAGNOSIS — R260 Ataxic gait: Secondary | ICD-10-CM | POA: Diagnosis present

## 2018-06-17 DIAGNOSIS — R2689 Other abnormalities of gait and mobility: Secondary | ICD-10-CM

## 2018-06-18 NOTE — Therapy (Signed)
[facility] [address] [place], [place], [address] Phone: [phone]   Fax:  [phone]  Physical Therapy Treatment  Patient Details  Name: [name] Barnett MRN: [id number] Date of Birth: 1976/10/15 Referring Provider: Erick Colace, MD   Encounter Date: 06/17/2018  PT End of Session - 06/17/18 1706    Visit Number  75    Number of Visits  95 per recertification    Date for PT Re-Evaluation  08/16/18 12 week recertification    Authorization Type  [employer]    PT Start Time  1533    PT Stop Time  1626    PT Time Calculation (min)  53 min    Equipment Utilized During Treatment  Gait belt    Activity Tolerance  Patient tolerated treatment well;No increased pain    Behavior During Therapy  WFL for tasks assessed/performed       Past Medical History:  Diagnosis Date  . Duodenal obstruction   . GERD (gastroesophageal reflux disease)   . Hypertension   . Stroke [facility])    april 2018    Past Surgical History:  Procedure Laterality Date  . ABDOMINAL HYSTERECTOMY    . BALLOON DILATION  12/31/2011   Procedure: BALLOON DILATION;  Surgeon: Barrie Folk, MD;  Location: [facility] ENDOSCOPY;  Service: Endoscopy;  Laterality: N/A;  . CHOLECYSTECTOMY  07/28/11  . ESOPHAGOGASTRODUODENOSCOPY  10/17/2011   Procedure: ESOPHAGOGASTRODUODENOSCOPY (EGD);  Surgeon: Barrie Folk, MD;  Location: [facility] ENDOSCOPY;  Service: Endoscopy;  Laterality: N/A;  . IR ANGIO INTRA EXTRACRAN SEL INTERNAL CAROTID BILAT MOD SED  04/07/2017  . IR ANGIO INTRA EXTRACRAN SEL INTERNAL CAROTID BILAT MOD SED  01/07/2018  . IR ANGIO VERTEBRAL SEL VERTEBRAL UNI R MOD SED  04/07/2017  . IR ANGIO VERTEBRAL SEL VERTEBRAL UNI R MOD SED  01/07/2018  . IR ANGIOGRAM FOLLOW UP STUDY  04/07/2017  . IR ANGIOGRAM FOLLOW UP STUDY  04/07/2017  . IR ANGIOGRAM FOLLOW UP STUDY  04/07/2017  . IR ANGIOGRAM FOLLOW UP STUDY  04/07/2017  . IR ANGIOGRAM FOLLOW UP STUDY  04/07/2017  . IR ANGIOGRAM  SELECTIVE EACH ADDITIONAL VESSEL  04/07/2017  . IR TRANSCATH/EMBOLIZ  04/07/2017  . IR US GUIDE VASC ACCESS RIGHT  01/07/2018  . PARS PLANA VITRECTOMY Right 05/01/2017   Procedure: PARS PLANA VITRECTOMY WITH 25 GAUGE RIGHT EYE, endolaser photocoaglation;  Surgeon: Carmela Rima, MD;  Location: [facility] OR;  Service: Ophthalmology;  Laterality: Right;  . RADIOLOGY WITH ANESTHESIA N/A 04/07/2017   Procedure: RADIOLOGY WITH ANESTHESIA;  Surgeon: Lisbeth Renshaw, MD;  Location: [facility] OR;  Service: Radiology;  Laterality: N/A;  . REPLACEMENT TOTAL KNEE BILATERAL  2004  . TEMPOROMANDIBULAR JOINT SURGERY     2 surgeries  . TUMOR REMOVAL    . VAGOTOMY  01/23/2012   Procedure: VAGOTOMY, antrectomy and BII;  Surgeon: Currie Paris, MD;  Location: [facility] OR;  Service: General;  Laterality: N/A;  Laparotomy with vagotomy.    There were no vitals filed for this visit.  Subjective Assessment - 06/17/18 1530    Subjective  No falls. She uses RW most of time in home.     Patient is accompained by:  Family member    Limitations  Standing;Walking    Patient Stated Goals  To walk safely without walker, improve balance so no more falls.    Currently in Pain?  Yes    Pain Score  1     Pain Location  Groin  Pain Orientation  Right    Pain Descriptors / Indicators  Discomfort    Pain Type  Chronic pain    Pain Onset  More than a month ago    Pain Frequency  Constant    Aggravating Factors   stretching    Pain Relieving Factors  standing & movements    Multiple Pain Sites  No      Neuromuscular Re-education using Balance Master System with visual feedback on screen, PT providing tactile & verbal cues to shift from pelvis. Center to anterior, center to posterior, anterior to posterior, center to right, center to left, right to left, center to right anterior, center to left anterior, center to right posterior, center to left posterior, right anterior to left posterior, right posterior to left anterior:  All 50%  limits of stability, with 6 sec hold times for 1 min ea activity. Circles clockwise & counterclockwise with 8 target points - 5 sec hold per target, 2 minutes ea direction. Left step: wt center, wt shift to right side & step forward with left foot with wt shift to that LE, then wt shift back to right foot and step left foot back beside right foot. 5 sec targets for wt shift for 2 minutes. Repeat with right step.  Gait Training: Pre-gait in parallel bars with UE support/supervision/verbal cues initially and progressed to minA / no UE support:  PT placed circle targets for step through pattern with proper step length & width. Pt weight shifting with pelvis to stance limb with tactile & verbal cues. Progressed to ambulating without AD with minA with carryover of pelvic weight shifts to stance limb. Pt began to perform swing phase with stiff LE / minimal to no knee flexion. PT cued on knee flexion for swing. PT demo carryover of proper technique on RW to develop proper neuromuscular movements. Pt return demo & her husband verbalized understanding and need for carryover with majority of gait to make changes.                            PT Short Term Goals - 05/18/18 1440      PT SHORT TERM GOAL #1   Title  Pt will improve DGI to >/= 10/24 without AD with min guard    Baseline  decreased to 5/24     Time  6    Period  Weeks    Status  Revised    Target Date  07/02/18      PT SHORT TERM GOAL #2   Title  Pt will decrease falls risk for limited community gait as indicated by increase in gait velocity without AD to > or = 2.27ft/sec    Baseline  decreased to 1.85 ft/sec without AD    Time  6    Period  Weeks    Status  Revised    Target Date  07/02/18      PT SHORT TERM GOAL #3   Title  Pt will decrease falls risk as indicated by increase in BERG balance score to > or = 50/56    Baseline  decreased to 48/56    Time  6    Period  Weeks    Status  Revised    Target Date   07/02/18      PT SHORT TERM GOAL #4   Title  Pt will ambulate x 230' indoors without AD and supervision    Time  6  Period  Weeks    Status  Revised    Target Date  07/02/18      PT SHORT TERM GOAL #5   Title  Pt will maintain RLE improvement in pain reporting a 1-2/10 overall, with stretching and strengthening HEP; pt will also demonstrate independence with ongoing HEP (treadmill, vestibular, balance)    Time  6    Period  Weeks    Status  New    Target Date  07/02/18        PT Long Term Goals - 05/18/18 1449      PT LONG TERM GOAL #1   Title  Pt will report ongoing improvement in RLE pain and will demonstrate independence with HEP (RLE stretching, balance, vestibular, treadmill, household chores)    Time  12    Period  Weeks    Status  Revised    Target Date  08/16/18      PT LONG TERM GOAL #2   Title  Pt will demonstrate improved balance and decreased falls risk as indicated by BERG balance score of > or = 52/56    Time  12    Period  Weeks    Status  Revised    Target Date  08/16/18      PT LONG TERM GOAL #3   Title  Pt will ambulate without AD x 250' over outdoor paved surfaces with min guard    Time  12    Period  Weeks    Status  Revised    Target Date  08/16/18      PT LONG TERM GOAL #4   Title  Pt will report improvement in Neuro QOL-LE to >/= 40%    Baseline  need to re-assess    Time  12    Period  Weeks    Status  Revised    Target Date  08/16/18      PT LONG TERM GOAL #5   Title  Pt will decrease falls risk during gait in community as indicated by increase in gait velocity to > or = 2.6 ft/sec without AD    Time  12    Period  Weeks    Status  Revised    Target Date  08/16/18      PT LONG TERM GOAL #6   Title  Pt will demonstrate decreased falls risk with gait as indicated by increase in DGI score to >/= 15/24 without AD    Time  12    Period  Weeks    Status  Revised    Target Date  08/16/18            Plan - 06/17/18 1800     Clinical Impression Statement  Patient improved weight shift with visual, tactile & verbal cues using balance master system.  PT transitioned to weight shift with stepping to targets inside parallel bars to progress neuromuscular to gait and then progressed to gait with & without RW with focus on movement pattern.     Rehab Potential  Good    PT Frequency  2x / week    PT Duration  12 weeks    PT Treatment/Interventions  ADLs/Self Care Home Management;Aquatic Therapy;DME Instruction;Gait training;Stair training;Functional mobility training;Therapeutic activities;Therapeutic exercise;Balance training;Neuromuscular re-education;Patient/family education;Vestibular;Visual/perceptual remediation/compensation;Cognitive remediation;Orthotic Fit/Training;Electrical Stimulation;Cryotherapy;Moist Heat;Manual techniques;Passive range of motion;Taping    PT Next Visit Plan  continue neuromuscular re-education working on weight shifts with Balance Master system.  continue to address dizziness with habituation as needed, progress  x1 to x2 ex's as able; if not dizzy: work on yoga poses in standing to address strengtheing/balance, gait on treadmill with pt self propelling belt, gait with no AD with resistance to increase weight shift to left/improve posture, use of Bioness with gait and balance/strengthening ex's. also needs continued work on core strengthening and overall proprioception     Consulted and Agree with Plan of Care  Patient;Family member/caregiver    Family Member Consulted  spouse       Patient will benefit from skilled therapeutic intervention in order to improve the following deficits and impairments:  Abnormal gait, Decreased balance, Decreased cognition, Decreased coordination, Decreased strength, Difficulty walking, Dizziness, Impaired sensation, Impaired vision/preception, Pain, Decreased activity tolerance, Decreased mobility, Decreased endurance, Postural dysfunction  Visit  Diagnosis: Unsteadiness on feet  Ataxic gait  Other abnormalities of gait and mobility  Other lack of coordination     Problem List Patient Active Problem List   Diagnosis Date Noted  . 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 Weakness with dizziness-  since [facility] and CVA 04/07/17 08/07/2017  . Disturbances of vision, late effect of stroke 08/06/2017  . Health education/counseling 08/05/2017  . High risk medications (not anticoagulants) long-term use 08/05/2017  . Neuritis-  R sided:  arm and leg/ body due to stroke 08/05/2017  . Elevated LDL cholesterol level 07/11/2017  . [employer] (NIH) Stroke Scale limb ataxia score 2, ataxia present in two limbs 06/25/2017  . 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 Elevated vitamin B12 level 05/30/2017  . Vitamin D deficiency 05/29/2017  . History of tobacco abuse-  30pk yr hx - quit 04/07/17 05/21/2017  . Gait disturbance, post-stroke 05/14/2017  . Benign essential HTN   . Vitreous hemorrhage of right eye ([name])   . Adjustment disorder with mixed anxiety and depressed mood   . 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 s/p SAH (subarachnoid hemorrhage) ([name]) 04/19/2017  . Basilar artery aneurysm ([name])   . Hypoxia   . 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 CVA (cerebral vascular accident) ([name]) 04/07/2017  . Elevated gastrin level 01/25/2012  . Hypokalemia 01/21/2012  . Nausea & vomiting 01/20/2012  . Epigastric pain 01/20/2012  . Duodenal ulcer, acute with obstruction 10/17/2011  . S/P laparoscopic cholecystectomy 10/16/2011    Vladimir Faster PT, DPT 06/18/2018, 11:02 AM  [facility] [address] [place], [place], [address] Phone: [phone]   Fax:  [phone]  Name: [name] Barnett MRN: [id number] Date of Birth: 10/17/1976

## 2018-06-19 ENCOUNTER — Ambulatory Visit: Payer: BLUE CROSS/BLUE SHIELD | Admitting: Rehabilitation

## 2018-06-19 ENCOUNTER — Encounter: Payer: Self-pay | Admitting: Rehabilitation

## 2018-06-19 DIAGNOSIS — R2689 Other abnormalities of gait and mobility: Secondary | ICD-10-CM

## 2018-06-19 DIAGNOSIS — R2681 Unsteadiness on feet: Secondary | ICD-10-CM

## 2018-06-19 DIAGNOSIS — R278 Other lack of coordination: Secondary | ICD-10-CM

## 2018-06-19 DIAGNOSIS — R260 Ataxic gait: Secondary | ICD-10-CM

## 2018-06-19 NOTE — Therapy (Signed)
[facility] [address] [place], [place], [address] Phone: [phone]   Fax:  [phone]  Physical Therapy Treatment  Patient Details  Name: [name] Barnett MRN: [id number] Date of Birth: [birth year] Referring Provider: Erick Colace, MD   Encounter Date: 06/19/2018  PT End of Session - 06/19/18 2048    Visit Number  76    Number of Visits  95 per recertification    Date for PT Re-Evaluation  08/16/18 12 week recertification    Authorization Type  [employer]    PT Start Time  1617    PT Stop Time  1702    PT Time Calculation (min)  45 min    Equipment Utilized During Treatment  Gait belt    Activity Tolerance  Patient tolerated treatment well;No increased pain    Behavior During Therapy  WFL for tasks assessed/performed       Past Medical History:  Diagnosis Date  . Duodenal obstruction   . GERD (gastroesophageal reflux disease)   . Hypertension   . Stroke [facility])    april 2018    Past Surgical History:  Procedure Laterality Date  . ABDOMINAL HYSTERECTOMY    . BALLOON DILATION  12/31/2011   Procedure: BALLOON DILATION;  Surgeon: Barrie Folk, MD;  Location: [facility] ENDOSCOPY;  Service: Endoscopy;  Laterality: N/A;  . CHOLECYSTECTOMY  07/28/11  . ESOPHAGOGASTRODUODENOSCOPY  10/17/2011   Procedure: ESOPHAGOGASTRODUODENOSCOPY (EGD);  Surgeon: Barrie Folk, MD;  Location: [facility] ENDOSCOPY;  Service: Endoscopy;  Laterality: N/A;  . IR ANGIO INTRA EXTRACRAN SEL INTERNAL CAROTID BILAT MOD SED  04/07/2017  . IR ANGIO INTRA EXTRACRAN SEL INTERNAL CAROTID BILAT MOD SED  01/07/2018  . IR ANGIO VERTEBRAL SEL VERTEBRAL UNI R MOD SED  04/07/2017  . IR ANGIO VERTEBRAL SEL VERTEBRAL UNI R MOD SED  01/07/2018  . IR ANGIOGRAM FOLLOW UP STUDY  04/07/2017  . IR ANGIOGRAM FOLLOW UP STUDY  04/07/2017  . IR ANGIOGRAM FOLLOW UP STUDY  04/07/2017  . IR ANGIOGRAM FOLLOW UP STUDY  04/07/2017  . IR ANGIOGRAM FOLLOW UP STUDY  04/07/2017  . IR ANGIOGRAM  SELECTIVE EACH ADDITIONAL VESSEL  04/07/2017  . IR TRANSCATH/EMBOLIZ  04/07/2017  . IR US GUIDE VASC ACCESS RIGHT  01/07/2018  . PARS PLANA VITRECTOMY Right 05/01/2017   Procedure: PARS PLANA VITRECTOMY WITH 25 GAUGE RIGHT EYE, endolaser photocoaglation;  Surgeon: Carmela Rima, MD;  Location: [facility] OR;  Service: Ophthalmology;  Laterality: Right;  . RADIOLOGY WITH ANESTHESIA N/A 04/07/2017   Procedure: RADIOLOGY WITH ANESTHESIA;  Surgeon: Lisbeth Renshaw, MD;  Location: [facility] OR;  Service: Radiology;  Laterality: N/A;  . REPLACEMENT TOTAL KNEE BILATERAL  2004  . TEMPOROMANDIBULAR JOINT SURGERY     2 surgeries  . TUMOR REMOVAL    . VAGOTOMY  01/23/2012   Procedure: VAGOTOMY, antrectomy and BII;  Surgeon: Currie Paris, MD;  Location: [facility] OR;  Service: General;  Laterality: N/A;  Laparotomy with vagotomy.    There were no vitals filed for this visit.  Subjective Assessment - 06/19/18 1621    Subjective  Pt reports no changes since last visit, no falls.     Patient is accompained by:  Family member    Limitations  Standing;Walking    Patient Stated Goals  To walk safely without walker, improve balance so no more falls.    Currently in Pain?  No/denies              NMR:  Utilized  weighted backpack (varied from 12.5 down to 7lbs) to decrease ataxia during gait and exercise.  Performed ambulation x 230' x 4 reps in this manner with min/guard to mod A (intermittently for LOB esp around turns) with light tactile cues for improved forward movement rather than lateral movement and also with cues for improved stepping when making turns.  Note marked improvement in gait quality with 12.5 vs 7 lbs.  With 12.5 lbs donned in backpack performed stepping to targets anteriorly, diagonally and laterally L x 5 reps each and R x 5 reps each with emphasis on L control when stepping and stance control when in WB position.  With repetition pt demo'd marked improvement in control LLE during open chain exercise.   Continued with having pt step laterally over target with LLE and back with cues on slower movement and landing with LE shoulder width apart for improved balance when in stance.  Performed stairs with weighted backpack in reciprocal pattern again to emphasis control in LLE in both stance and swing phase.  Pt tends to land RLE adducted under body and demos LOB to the R, however once cued, demo'd improvement in task. Ended session with trial of simulated compression garment (shorts) with use of ace wraps.  Note marked improvement in gait quality (400'), esp with repetition and cues for improved stepping sequence when making turns.  Encouraged pt to go out to sports stores and try on different compression shorts and ambulate in store with husband before making decision.                  PT Education - 06/19/18 2047    Education provided  Yes    Education Details  Trying varying compression shorts (high top) to improve proprioceptive feedback and decreased ataxia during gait.     Person(s) Educated  Patient;Spouse    Methods  Explanation;Demonstration    Comprehension  Verbalized understanding;Returned demonstration       PT Short Term Goals - 05/18/18 1440      PT SHORT TERM GOAL #1   Title  Pt will improve DGI to >/= 10/24 without AD with min guard    Baseline  decreased to 5/24     Time  6    Period  Weeks    Status  Revised    Target Date  07/02/18      PT SHORT TERM GOAL #2   Title  Pt will decrease falls risk for limited community gait as indicated by increase in gait velocity without AD to > or = 2.68ft/sec    Baseline  decreased to 1.85 ft/sec without AD    Time  6    Period  Weeks    Status  Revised    Target Date  07/02/18      PT SHORT TERM GOAL #3   Title  Pt will decrease falls risk as indicated by increase in BERG balance score to > or = 50/56    Baseline  decreased to 48/56    Time  6    Period  Weeks    Status  Revised    Target Date  07/02/18      PT  SHORT TERM GOAL #4   Title  Pt will ambulate x 230' indoors without AD and supervision    Time  6    Period  Weeks    Status  Revised    Target Date  07/02/18      PT SHORT TERM GOAL #  5   Title  Pt will maintain RLE improvement in pain reporting a 1-2/10 overall, with stretching and strengthening HEP; pt will also demonstrate independence with ongoing HEP (treadmill, vestibular, balance)    Time  6    Period  Weeks    Status  New    Target Date  07/02/18        PT Long Term Goals - 05/18/18 1449      PT LONG TERM GOAL #1   Title  Pt will report ongoing improvement in RLE pain and will demonstrate independence with HEP (RLE stretching, balance, vestibular, treadmill, household chores)    Time  12    Period  Weeks    Status  Revised    Target Date  08/16/18      PT LONG TERM GOAL #2   Title  Pt will demonstrate improved balance and decreased falls risk as indicated by BERG balance score of > or = 52/56    Time  12    Period  Weeks    Status  Revised    Target Date  08/16/18      PT LONG TERM GOAL #3   Title  Pt will ambulate without AD x 250' over outdoor paved surfaces with min guard    Time  12    Period  Weeks    Status  Revised    Target Date  08/16/18      PT LONG TERM GOAL #4   Title  Pt will report improvement in Neuro QOL-LE to >/= 40%    Baseline  need to re-assess    Time  12    Period  Weeks    Status  Revised    Target Date  08/16/18      PT LONG TERM GOAL #5   Title  Pt will decrease falls risk during gait in community as indicated by increase in gait velocity to > or = 2.6 ft/sec without AD    Time  12    Period  Weeks    Status  Revised    Target Date  08/16/18      PT LONG TERM GOAL #6   Title  Pt will demonstrate decreased falls risk with gait as indicated by increase in DGI score to >/= 15/24 without AD    Time  12    Period  Weeks    Status  Revised    Target Date  08/16/18            Plan - 06/19/18 2049    Clinical Impression  Statement  Utilized weight and compression during session to increase propriocetive feedback for decreased ataxia during gait and exercise.  Pt demo'd marked improvement with gait and mobility with waited backpack and simulated compression shorts.  Recommend they go to sports stores to try some before purchase.      Rehab Potential  Good    PT Frequency  2x / week    PT Duration  12 weeks    PT Treatment/Interventions  ADLs/Self Care Home Management;Aquatic Therapy;DME Instruction;Gait training;Stair training;Functional mobility training;Therapeutic activities;Therapeutic exercise;Balance training;Neuromuscular re-education;Patient/family education;Vestibular;Visual/perceptual remediation/compensation;Cognitive remediation;Orthotic Fit/Training;Electrical Stimulation;Cryotherapy;Moist Heat;Manual techniques;Passive range of motion;Taping    PT Next Visit Plan  Utilize compression and weights to decrease ataxia, continue neuromuscular re-education working on weight shifts with Balance Master system.  continue to address dizziness with habituation as needed, progress x1 to x2 ex's as able; if not dizzy: work on yoga poses in standing to address strengtheing/balance,  gait on treadmill with pt self propelling belt, gait with no AD with resistance to increase weight shift to left/improve posture, use of Bioness with gait and balance/strengthening ex's. also needs continued work on core strengthening and overall proprioception     Consulted and Agree with Plan of Care  Patient;Family member/caregiver    Family Member Consulted  spouse       Patient will benefit from skilled therapeutic intervention in order to improve the following deficits and impairments:  Abnormal gait, Decreased balance, Decreased cognition, Decreased coordination, Decreased strength, Difficulty walking, Dizziness, Impaired sensation, Impaired vision/preception, Pain, Decreased activity tolerance, Decreased mobility, Decreased endurance,  Postural dysfunction  Visit Diagnosis: Unsteadiness on feet  Ataxic gait  Other abnormalities of gait and mobility  Other lack of coordination     Problem List Patient Active Problem List   Diagnosis Date Noted  . 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 Weakness with dizziness-  since [facility] and CVA 04/07/17 08/07/2017  . Disturbances of vision, late effect of stroke 08/06/2017  . Health education/counseling 08/05/2017  . High risk medications (not anticoagulants) long-term use 08/05/2017  . Neuritis-  R sided:  arm and leg/ body due to stroke 08/05/2017  . Elevated LDL cholesterol level 07/11/2017  . [employer] (NIH) Stroke Scale limb ataxia score 2, ataxia present in two limbs 06/25/2017  . 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 Elevated vitamin B12 level 05/30/2017  . Vitamin D deficiency 05/29/2017  . History of tobacco abuse-  30pk yr hx - quit 04/07/17 05/21/2017  . Gait disturbance, post-stroke 05/14/2017  . Benign essential HTN   . Vitreous hemorrhage of right eye ([name])   . Adjustment disorder with mixed anxiety and depressed mood   . 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 s/p SAH (subarachnoid hemorrhage) ([name]) 04/19/2017  . Basilar artery aneurysm ([name])   . Hypoxia   . 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 CVA (cerebral vascular accident) ([name]) 04/07/2017  . Elevated gastrin level 01/25/2012  . Hypokalemia 01/21/2012  . Nausea & vomiting 01/20/2012  . Epigastric pain 01/20/2012  . Duodenal ulcer, acute with obstruction 10/17/2011  . S/P laparoscopic cholecystectomy 10/16/2011    Vista Deck 06/19/2018, 8:52 PM  [facility] [address] [place], [place], [address] Phone: [phone]   Fax:  [phone]  Name: [name] Barnett MRN: [id number] Date of Birth:  May 16, 1976

## 2018-06-23 ENCOUNTER — Other Ambulatory Visit: Payer: Self-pay | Admitting: Physical Medicine & Rehabilitation

## 2018-06-23 NOTE — Telephone Encounter (Signed)
Recieved medication refill request, no mention of this medication mentioned on any notes for 2019, unsure to refill.   Please advise.

## 2018-06-24 ENCOUNTER — Encounter: Payer: Self-pay | Admitting: Physical Therapy

## 2018-06-24 ENCOUNTER — Ambulatory Visit: Payer: BLUE CROSS/BLUE SHIELD | Admitting: Physical Therapy

## 2018-06-24 ENCOUNTER — Emergency Department (HOSPITAL_COMMUNITY)
Admission: EM | Admit: 2018-06-24 | Discharge: 2018-06-24 | Disposition: A | Discharge status: Home / Self Care | Payer: BLUE CROSS/BLUE SHIELD | Attending: Emergency Medicine | Admitting: Emergency Medicine

## 2018-06-24 ENCOUNTER — Other Ambulatory Visit: Payer: Self-pay

## 2018-06-24 ENCOUNTER — Emergency Department (HOSPITAL_COMMUNITY): Payer: BLUE CROSS/BLUE SHIELD

## 2018-06-24 DIAGNOSIS — Z9049 Acquired absence of other specified parts of digestive tract: Secondary | ICD-10-CM | POA: Insufficient documentation

## 2018-06-24 DIAGNOSIS — W010XXA Fall on same level from slipping, tripping and stumbling without subsequent striking against object, initial encounter: Secondary | ICD-10-CM | POA: Diagnosis not present

## 2018-06-24 DIAGNOSIS — R278 Other lack of coordination: Secondary | ICD-10-CM

## 2018-06-24 DIAGNOSIS — I10 Essential (primary) hypertension: Secondary | ICD-10-CM | POA: Diagnosis not present

## 2018-06-24 DIAGNOSIS — Z87891 Personal history of nicotine dependence: Secondary | ICD-10-CM | POA: Diagnosis not present

## 2018-06-24 DIAGNOSIS — Z8673 Personal history of transient ischemic attack (TIA), and cerebral infarction without residual deficits: Secondary | ICD-10-CM | POA: Diagnosis not present

## 2018-06-24 DIAGNOSIS — R260 Ataxic gait: Secondary | ICD-10-CM

## 2018-06-24 DIAGNOSIS — Y929 Unspecified place or not applicable: Secondary | ICD-10-CM | POA: Insufficient documentation

## 2018-06-24 DIAGNOSIS — R2689 Other abnormalities of gait and mobility: Secondary | ICD-10-CM

## 2018-06-24 DIAGNOSIS — S82831A Other fracture of upper and lower end of right fibula, initial encounter for closed fracture: Secondary | ICD-10-CM | POA: Diagnosis not present

## 2018-06-24 DIAGNOSIS — R2681 Unsteadiness on feet: Secondary | ICD-10-CM

## 2018-06-24 DIAGNOSIS — Z79899 Other long term (current) drug therapy: Secondary | ICD-10-CM | POA: Diagnosis not present

## 2018-06-24 DIAGNOSIS — F4323 Adjustment disorder with mixed anxiety and depressed mood: Secondary | ICD-10-CM | POA: Diagnosis not present

## 2018-06-24 DIAGNOSIS — Y9389 Activity, other specified: Secondary | ICD-10-CM | POA: Insufficient documentation

## 2018-06-24 DIAGNOSIS — Z96653 Presence of artificial knee joint, bilateral: Secondary | ICD-10-CM | POA: Insufficient documentation

## 2018-06-24 DIAGNOSIS — Y998 Other external cause status: Secondary | ICD-10-CM | POA: Diagnosis not present

## 2018-06-24 DIAGNOSIS — S99911A Unspecified injury of right ankle, initial encounter: Secondary | ICD-10-CM | POA: Diagnosis present

## 2018-06-24 DIAGNOSIS — S82301A Unspecified fracture of lower end of right tibia, initial encounter for closed fracture: Secondary | ICD-10-CM | POA: Diagnosis not present

## 2018-06-24 MED ORDER — HYDROCODONE-ACETAMINOPHEN 5-325 MG PO TABS
1.0000 | ORAL_TABLET | Freq: Once | ORAL | Status: AC
  Administered 2018-06-24: 1 via ORAL
  Filled 2018-06-24: qty 1

## 2018-06-24 MED ORDER — HYDROCODONE-ACETAMINOPHEN 5-325 MG PO TABS: 1.0000 | ORAL_TABLET | Freq: Four times a day (QID) | ORAL | 0 refills | Status: DC | PRN

## 2018-06-24 NOTE — ED Provider Notes (Signed)
[facility]-EMERGENCY DEPT Provider Note   CSN: [id number] Arrival date & time: 06/24/18  1830     History   Chief Complaint Chief Complaint  Patient presents with  . Fall  . Ankle Pain    HPI [name] Barnett is a 42 y.o. female with a comp gated past medical history as listed below who presents emergency department today for mechanical fall with right ankle/leg pain.  Patient reports she was using her walker when she tripped and her right leg folded.  She denies this being due to new weakness.  She denies head trauma or loss of consciousness.  She reports pain on the lateral aspect of her mid right leg as well as the medial aspect of her ankle.  She describes her pain as achy/throbbing and rates as a 10/10.  She reports it is worse with palpation.  She notes some bruising around her ankle.  There is no open wounds.  She denies any new numbness/tingling or weakness.  She is tried ice for this with mild relief.  No interventions prior to arrival.  She denies any preceding headache, visual changes, vertigo, or other symptoms.  HPI  Past Medical History:  Diagnosis Date  . Duodenal obstruction   . GERD (gastroesophageal reflux disease)   . Hypertension   . Stroke [facility])    april 2018    Patient Active Problem List   Diagnosis Date Noted  . 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 Weakness with dizziness-  since [facility] and CVA 04/07/17 08/07/2017  . Disturbances of vision, late effect of stroke 08/06/2017  . Health education/counseling 08/05/2017  . High risk medications (not anticoagulants) long-term use 08/05/2017  . Neuritis-  R sided:  arm and leg/ body due to stroke 08/05/2017  . Elevated LDL cholesterol level 07/11/2017  . [employer] ([facility]) Stroke Scale limb ataxia score 2, ataxia present in two limbs 06/25/2017  . 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 Elevated vitamin B12 level 05/30/2017  . Vitamin D deficiency  05/29/2017  . History of tobacco abuse-  30pk yr hx - quit 04/07/17 05/21/2017  . Gait disturbance, post-stroke 05/14/2017  . Benign essential HTN   . Vitreous hemorrhage of right eye ([name])   . Adjustment disorder with mixed anxiety and depressed mood   . 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 s/p SAH (subarachnoid hemorrhage) ([name]) 04/19/2017  . Basilar artery aneurysm ([name])   . Hypoxia   . 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 CVA (cerebral vascular accident) ([name]) 04/07/2017  . Elevated gastrin level 01/25/2012  . Hypokalemia 01/21/2012  . Nausea & vomiting 01/20/2012  . Epigastric pain 01/20/2012  . Duodenal ulcer, acute with obstruction 10/17/2011  . S/P laparoscopic cholecystectomy 10/16/2011    Past Surgical History:  Procedure Laterality Date  . ABDOMINAL HYSTERECTOMY    . BALLOON DILATION  12/31/2011   Procedure: BALLOON DILATION;  Surgeon: Barrie Folk, MD;  Location: [facility] ENDOSCOPY;  Service: Endoscopy;  Laterality: N/A;  . CHOLECYSTECTOMY  07/28/11  . ESOPHAGOGASTRODUODENOSCOPY  10/17/2011   Procedure: ESOPHAGOGASTRODUODENOSCOPY (EGD);  Surgeon: Barrie Folk, MD;  Location: [facility] ENDOSCOPY;  Service: Endoscopy;  Laterality: N/A;  . IR ANGIO INTRA EXTRACRAN SEL INTERNAL CAROTID BILAT MOD SED  04/07/2017  . IR ANGIO INTRA EXTRACRAN SEL INTERNAL CAROTID BILAT MOD SED  01/07/2018  . IR ANGIO VERTEBRAL SEL VERTEBRAL  UNI R MOD SED  04/07/2017  . IR ANGIO VERTEBRAL SEL VERTEBRAL UNI R MOD SED  01/07/2018  . IR ANGIOGRAM FOLLOW UP STUDY  04/07/2017  . IR ANGIOGRAM FOLLOW UP STUDY  04/07/2017  . IR ANGIOGRAM FOLLOW UP STUDY  04/07/2017  . IR ANGIOGRAM FOLLOW UP STUDY  04/07/2017  . IR ANGIOGRAM FOLLOW UP STUDY  04/07/2017  . IR ANGIOGRAM SELECTIVE EACH ADDITIONAL VESSEL  04/07/2017  . IR TRANSCATH/EMBOLIZ  04/07/2017  . IR US GUIDE VASC ACCESS RIGHT  01/07/2018  . PARS PLANA VITRECTOMY Right  05/01/2017   Procedure: PARS PLANA VITRECTOMY WITH 25 GAUGE RIGHT EYE, endolaser photocoaglation;  Surgeon: Carmela RimaPatel, Narendra, MD;  [facility];  Service: Ophthalmology;  Laterality: Right;  . RADIOLOGY WITH ANESTHESIA N/A 04/07/2017   Procedure: RADIOLOGY WITH ANESTHESIA;  Surgeon: [name], MD;  [facility];  Service: Radiology;  Laterality: N/A;  . REPLACEMENT TOTAL KNEE BILATERAL  2004  . TEMPOROMANDIBULAR JOINT SURGERY     2 surgeries  . TUMOR REMOVAL    . VAGOTOMY  01/23/2012   Procedure: VAGOTOMY, antrectomy and BII;  Surgeon: [name], MD;  Location: [facility] OR;  Service: General;  Laterality: N/A;  Laparotomy with vagotomy.     OB History   None      Home Medications    Prior to Admission medications   Medication Sig Start Date End Date Taking? Authorizing Provider  amLODipine (NORVASC) 5 MG tablet Take 1 tablet (5 mg total) by mouth daily. TAKE 5 mg daily 05/23/18   Opalski, Gavin Poundeborah, DO  atorvastatin (LIPITOR) 20 MG tablet Take 1 tablet (20 mg total) by mouth at bedtime. 07/11/17   Opalski, Gavin Poundeborah, DO  Coconut Oil 1000 MG CAPS Take 1,000 mg by mouth daily.    [provider]  FLUoxetine (PROZAC) 20 MG capsule Take 3 capsules (60 mg total) by mouth daily. 05/21/18   Opalski, Gavin Poundeborah, DO  gabapentin (NEURONTIN) 600 MG tablet Take 1 tablet (600 mg total) by mouth 4 (four) times daily. Patient taking differently: Take 600 mg by mouth 2 (two) times daily.  09/05/17   Kirsteins, Victorino SparrowAndrew E, MD  Multiple Vitamin (MULTIVITAMIN WITH MINERALS) TABS tablet Take 1 tablet by mouth daily.    [provider]  niacin 500 MG tablet Take 500 mg by mouth at bedtime.    [provider]  topiramate (TOPAMAX) 50 MG tablet TAKE 1 TABLET BY MOUTH TWICE A DAY 06/23/18   Kirsteins, Victorino SparrowAndrew E, MD  traMADol (ULTRAM) 50 MG tablet Take 1 tablet (50 mg total) by mouth 2 (two) times daily. 04/16/18   Kirsteins, Victorino SparrowAndrew E, MD  Vitamin D, Ergocalciferol, (DRISDOL) 50000 units  CAPS capsule Take 1 capsule (50,000 Units total) by mouth every 7 (seven) days. 02/17/18   Thomasene Lotpalski, Deborah, DO    Family History Family History  Problem Relation Age of Onset  . Hypertension Mother   . Restless legs syndrome Mother   . Hyperlipidemia Mother   . Heart disease Father   . Malignant hyperthermia Neg Hx     Social History Social History   Tobacco Use  . Smoking status: Former Smoker    Packs/day: 1.00    Years: 20.00    Pack years: 20.00    Types: Cigarettes    Last attempt to quit: 04/07/2017    Years since quitting: 1.2  . Smokeless tobacco: Never Used  Substance Use Topics  . Alcohol use: No  . Drug use: No  Allergies   Nsaids   Review of Systems Review of Systems  All other systems reviewed and are negative.    Physical Exam Updated Vital Signs BP 133/70   Pulse 67   Temp 97.7 F (36.5 C) (Oral)   Resp 18   Wt 77.1 kg (170 lb)   LMP 06/24/2018 (Exact Date)   SpO2 99%   BMI 29.18 kg/m   Physical Exam  Constitutional: She appears well-developed and well-nourished.  HENT:  Head: Normocephalic and atraumatic.  Right Ear: External ear normal.  Left Ear: External ear normal.  Eyes: Conjunctivae are normal. Right eye exhibits no discharge. Left eye exhibits no discharge. No scleral icterus.  Pulmonary/Chest: Effort normal. No respiratory distress.  Musculoskeletal:       Right knee: Normal. She exhibits normal range of motion. No tenderness found.       Right ankle: She exhibits swelling and ecchymosis. Tenderness. Medial malleolus tenderness found. Achilles tendon normal.       Right lower leg: She exhibits bony tenderness.       Legs:      Right foot: Normal.  Neurological: She is alert. No sensory deficit (no reported change from baseline).  Able strength of bilaterally lower extremities  Skin: Skin is warm and dry. Capillary refill takes less than 2 seconds. Ecchymosis noted. No pallor.  Psychiatric: She has a normal mood and  affect.  Nursing note and vitals reviewed.    ED Treatments / Results  Labs (all labs ordered are listed, but only abnormal results are displayed) Labs Reviewed - No data to display  EKG None  Radiology Dg Tibia/fibula Right  Result Date: 06/24/2018 CLINICAL DATA:  Trip and fall while ambulating with walker. Pain and swelling. History of stroke. EXAM: RIGHT ANKLE - COMPLETE 3+ VIEW; RIGHT TIBIA AND FIBULA - 2 VIEW; RIGHT FOOT COMPLETE - 3+ VIEW COMPARISON:  RIGHT hip radiograph March 25, 2018 FINDINGS: RIGHT foot: There is no evidence of acute fracture, dislocation, or joint effusion. Mild cortical thickening base of second through fifth metatarsus, possible old fractures. Small plantar calcaneal spur. There is no evidence of arthropathy or other focal bone abnormality. Soft tissues are unremarkable. RIGHT ankle: Acute nondisplaced posterior distal tibia/posterior malleolus fracture. Tiny bony fragments inferior to medial malleolus. The ankle mortise appears congruent and the tibiofibular syndesmosis intact. No destructive bony lesions. Soft tissue swelling without subcutaneous gas or radiopaque foreign bodies. RIGHT tibia-fibula: Acute nondisplaced comminuted mid to distal fibular diaphyseal fracture. No destructive bony lesions. Soft tissue planes are not suspicious. IMPRESSION: 1. Acute distal tibia/posterior malleolus nondisplaced fracture. 2. Tiny potentially acute medial malleolus avulsion fracture fragments. 3. Acute nondisplaced mid to distal fibula fracture. Electronically Signed   By: Awilda Metro M.D.   On: 06/24/2018 20:25   Dg Ankle Complete Right  Result Date: 06/24/2018 CLINICAL DATA:  Trip and fall while ambulating with walker. Pain and swelling. History of stroke. EXAM: RIGHT ANKLE - COMPLETE 3+ VIEW; RIGHT TIBIA AND FIBULA - 2 VIEW; RIGHT FOOT COMPLETE - 3+ VIEW COMPARISON:  RIGHT hip radiograph March 25, 2018 FINDINGS: RIGHT foot: There is no evidence of acute fracture,  dislocation, or joint effusion. Mild cortical thickening base of second through fifth metatarsus, possible old fractures. Small plantar calcaneal spur. There is no evidence of arthropathy or other focal bone abnormality. Soft tissues are unremarkable. RIGHT ankle: Acute nondisplaced posterior distal tibia/posterior malleolus fracture. Tiny bony fragments inferior to medial malleolus. The ankle mortise appears congruent and the tibiofibular syndesmosis intact.  No destructive bony lesions. Soft tissue swelling without subcutaneous gas or radiopaque foreign bodies. RIGHT tibia-fibula: Acute nondisplaced comminuted mid to distal fibular diaphyseal fracture. No destructive bony lesions. Soft tissue planes are not suspicious. IMPRESSION: 1. Acute distal tibia/posterior malleolus nondisplaced fracture. 2. Tiny potentially acute medial malleolus avulsion fracture fragments. 3. Acute nondisplaced mid to distal fibula fracture. Electronically Signed   By: Awilda Metro M.D.   On: 06/24/2018 20:25   Dg Foot Complete Right  Result Date: 06/24/2018 CLINICAL DATA:  Trip and fall while ambulating with walker. Pain and swelling. History of stroke. EXAM: RIGHT ANKLE - COMPLETE 3+ VIEW; RIGHT TIBIA AND FIBULA - 2 VIEW; RIGHT FOOT COMPLETE - 3+ VIEW COMPARISON:  RIGHT hip radiograph March 25, 2018 FINDINGS: RIGHT foot: There is no evidence of acute fracture, dislocation, or joint effusion. Mild cortical thickening base of second through fifth metatarsus, possible old fractures. Small plantar calcaneal spur. There is no evidence of arthropathy or other focal bone abnormality. Soft tissues are unremarkable. RIGHT ankle: Acute nondisplaced posterior distal tibia/posterior malleolus fracture. Tiny bony fragments inferior to medial malleolus. The ankle mortise appears congruent and the tibiofibular syndesmosis intact. No destructive bony lesions. Soft tissue swelling without subcutaneous gas or radiopaque foreign bodies. RIGHT  tibia-fibula: Acute nondisplaced comminuted mid to distal fibular diaphyseal fracture. No destructive bony lesions. Soft tissue planes are not suspicious. IMPRESSION: 1. Acute distal tibia/posterior malleolus nondisplaced fracture. 2. Tiny potentially acute medial malleolus avulsion fracture fragments. 3. Acute nondisplaced mid to distal fibula fracture. Electronically Signed   By: Awilda Metro M.D.   On: 06/24/2018 20:25    Procedures Procedures (including critical care time)  Medications Ordered in ED Medications  HYDROcodone-acetaminophen (NORCO/VICODIN) 5-325 MG per tablet 1 tablet (1 tablet Oral Given 06/24/18 2100)     Initial Impression / Assessment and Plan / ED Course  I have reviewed the triage vital signs and the nursing notes.  Pertinent labs & imaging results that were available during my care of the patient were reviewed by me and considered in my medical decision making (see chart for details).     42 y.o. female with complicated past medical history presenting with mechanical fall.  She denies any head trauma or loss conscious.  Patient is found to have tenderness of the mid right fibula as well as the medial malleolus on exam.  There is mild ecchymosis and swelling of the medial malleolus.  She is neurovascularly intact at her baseline.  There is no open wounds.  Compartments are soft.  Pain is controlled in the department.  X-rays are with distal tibial/posterior malleolus fracture, medial malleolus avulsion fracture, and mid fibular fracture.  All nondisplaced.  Compartments remain soft.  There is no open wounds.  This a closed fracture.  Patient placed in splint.  Patient is a follow-up with orthopedics.  Short course of pain medication was prescribed.  Strict return precautions were discussed.  Patient be discharged home.  Final Clinical Impressions(s) / ED Diagnoses   Final diagnoses:  Closed fracture of distal end of right fibula and tibia, initial encounter     ED Discharge Orders    None       Princella Pellegrini 06/24/18 2159    Terrilee Files, MD 06/25/18 [phone]

## 2018-06-24 NOTE — Therapy (Signed)
[facility] [address] [place], [place], [address] Phone: [phone]   Fax:  [phone]  Physical Therapy Treatment  Patient Details  Name: [name] Barnett MRN: [id number] Date of Birth: [birth year] Referring Provider: Charlett Blake, MD   Encounter Date: 06/24/2018  PT End of Session - 06/24/18 1637    Visit Number  23    Number of Visits  95 per recertification    Date for PT Re-Evaluation  72/53/66 12 week recertification    Authorization Type  BC[employer]    PT Start Time  4403    PT Stop Time  1615    PT Time Calculation (min)  44 min    Equipment Utilized During Treatment  Gait belt    Activity Tolerance  Patient tolerated treatment well;No increased pain    Behavior During Therapy  WFL for tasks assessed/performed       Past Medical History:  Diagnosis Date  . Duodenal obstruction   . GERD (gastroesophageal reflux disease)   . Hypertension   . Stroke [facility])    april 2018    Past Surgical History:  Procedure Laterality Date  . ABDOMINAL HYSTERECTOMY    . BALLOON DILATION  12/31/2011   Procedure: BALLOON DILATION;  Surgeon: Missy Sabins, MD;  Location: [facility] ENDOSCOPY;  Service: Endoscopy;  Laterality: N/A;  . CHOLECYSTECTOMY  07/28/11  . ESOPHAGOGASTRODUODENOSCOPY  10/17/2011   Procedure: ESOPHAGOGASTRODUODENOSCOPY (EGD);  Surgeon: Missy Sabins, MD;  Location: [facility] ENDOSCOPY;  Service: Endoscopy;  Laterality: N/A;  . IR ANGIO INTRA EXTRACRAN SEL INTERNAL CAROTID BILAT MOD SED  04/07/2017  . IR ANGIO INTRA EXTRACRAN SEL INTERNAL CAROTID BILAT MOD SED  01/07/2018  . IR ANGIO VERTEBRAL SEL VERTEBRAL UNI R MOD SED  04/07/2017  . IR ANGIO VERTEBRAL SEL VERTEBRAL UNI R MOD SED  01/07/2018  . IR ANGIOGRAM FOLLOW UP STUDY  04/07/2017  . IR ANGIOGRAM FOLLOW UP STUDY  04/07/2017  . IR ANGIOGRAM FOLLOW UP STUDY  04/07/2017  . IR ANGIOGRAM FOLLOW UP STUDY  04/07/2017  . IR ANGIOGRAM FOLLOW UP STUDY  04/07/2017  . IR ANGIOGRAM  SELECTIVE EACH ADDITIONAL VESSEL  04/07/2017  . IR TRANSCATH/EMBOLIZ  04/07/2017  . IR US GUIDE VASC ACCESS RIGHT  01/07/2018  . PARS PLANA VITRECTOMY Right 05/01/2017   Procedure: PARS PLANA VITRECTOMY WITH 25 GAUGE RIGHT EYE, endolaser photocoaglation;  Surgeon: Jalene Mullet, MD;  Location: [place];  Service: Ophthalmology;  Laterality: Right;  . RADIOLOGY WITH ANESTHESIA N/A 04/07/2017   Procedure: RADIOLOGY WITH ANESTHESIA;  Surgeon: Consuella Lose, MD;  Location: [place];  Service: Radiology;  Laterality: N/A;  . REPLACEMENT TOTAL KNEE BILATERAL  2004  . TEMPOROMANDIBULAR JOINT SURGERY     2 surgeries  . TUMOR REMOVAL    . VAGOTOMY  01/23/2012   Procedure: VAGOTOMY, antrectomy and BII;  Surgeon: Haywood Lasso, MD;  Location: [place];  Service: General;  Laterality: N/A;  Laparotomy with vagotomy.    There were no vitals filed for this visit.  Subjective Assessment - 06/24/18 1534    Subjective  Pt reports no changes since last visit, no falls. Reports some tightness in hip. gets another shot in hip sept 3rd. Didn't get compression shorts, so PTA wrapped pt with ace wrap.    Patient is accompained by:  Family member    Limitations  Standing;Walking    Patient Stated Goals  To walk safely without walker, improve balance so no more falls.  Pain Score  1     Pain Location  Hip    Pain Orientation  Right                       [facility] Adult PT Treatment/Exercise - 06/24/18 1621      Ambulation/Gait   Ambulation/Gait  Yes    Ambulation/Gait Assistance  4: Min assist;3: Mod assist    Ambulation Distance (Feet)  -- around gym to and from ex mat     Assistive device  None    Gait Pattern  Step-through pattern;Decreased stance time - left;Decreased stride length;Decreased weight shift to left;Ataxic    Ambulation Surface  Level;Indoor    Gait Comments  Resisted gait around gym x 2 with min-mod assist and UE hand held support. With pelvis/LE's wrapped with Ace wraps (x3  wraps) for proprioceptive feedback and PTA applying pressure down through pt's shoulders for decreased ataxia. Pt needing verbal cues on posture and step length and arms swing.      Neuro Re-ed    Neuro Re-ed Details   in // bars on balance board working on ankle strategy and balance in the fwd<>bwd, then side<>side directions, with EO progressing to [facility] with head turns/ nods x 10 reps x 1 set each, min- mod assist  and intermittent UE support to prevent LOB. Verbal cueing on posture (tending to twist trunk toard the right) and foot placement.  Rocking the board in lateral, then ant/post directions with cues/facilitation for postural alignment as pt tends to rotate pelvis and shoulders. Verbal and tactile cueing on posture .  At counter top with sports cord around pt's hips while standing across red balance beam- forward stepping to floor and back onto beam with emphasis on step placement (circle targets used) and on weight shifting while having resistance applied via sports cord,  x 5 each LE. Needing verbal + tactile cues on maintaining upright posture. min-mod assist and UE support to prevent LOB. On floor next to mat with no sports cord- Working on single leg stance by lightly tapping compliant target (foam bubbles) in front x 5 reps each foot. Pt needing min-mod assist for balance no UE support. Verbal and tactile cues on foot placement, upright posture and slowing down and focusing on contolled movements.                PT Short Term Goals - 05/18/18 1440      PT SHORT TERM GOAL #1   Title  Pt will improve DGI to >/= 10/24 without AD with min guard    Baseline  decreased to 5/24     Time  6    Period  Weeks    Status  Revised    Target Date  07/02/18      PT SHORT TERM GOAL #2   Title  Pt will decrease falls risk for limited community gait as indicated by increase in gait velocity without AD to > or = 2.35f/sec    Baseline  decreased to 1.85 ft/sec without AD    Time  6    Period   Weeks    Status  Revised    Target Date  07/02/18      PT SHORT TERM GOAL #3   Title  Pt will decrease falls risk as indicated by increase in BERG balance score to > or = 50/56    Baseline  decreased to 48/56    Time  6  Period  Weeks    Status  Revised    Target Date  07/02/18      PT SHORT TERM GOAL #4   Title  Pt will ambulate x 230' indoors without AD and supervision    Time  6    Period  Weeks    Status  Revised    Target Date  07/02/18      PT SHORT TERM GOAL #5   Title  Pt will maintain RLE improvement in pain reporting a 1-2/10 overall, with stretching and strengthening HEP; pt will also demonstrate independence with ongoing HEP (treadmill, vestibular, balance)    Time  6    Period  Weeks    Status  New    Target Date  07/02/18        PT Long Term Goals - 05/18/18 1449      PT LONG TERM GOAL #1   Title  Pt will report ongoing improvement in RLE pain and will demonstrate independence with HEP (RLE stretching, balance, vestibular, treadmill, household chores)    Time  12    Period  Weeks    Status  Revised    Target Date  08/16/18      PT LONG TERM GOAL #2   Title  Pt will demonstrate improved balance and decreased falls risk as indicated by BERG balance score of > or = 52/56    Time  12    Period  Weeks    Status  Revised    Target Date  08/16/18      PT LONG TERM GOAL #3   Title  Pt will ambulate without AD x 250' over outdoor paved surfaces with min guard    Time  12    Period  Weeks    Status  Revised    Target Date  08/16/18      PT LONG TERM GOAL #4   Title  Pt will report improvement in Neuro QOL-LE to >/= 40%    Baseline  need to re-assess    Time  12    Period  Weeks    Status  Revised    Target Date  08/16/18      PT LONG TERM GOAL #5   Title  Pt will decrease falls risk during gait in community as indicated by increase in gait velocity to > or = 2.6 ft/sec without AD    Time  12    Period  Weeks    Status  Revised    Target Date   08/16/18      PT LONG TERM GOAL #6   Title  Pt will demonstrate decreased falls risk with gait as indicated by increase in DGI score to >/= 15/24 without AD    Time  12    Period  Weeks    Status  Revised    Target Date  08/16/18            Plan - 06/24/18 1640    Clinical Impression Statement  Today's skilled session focused on balance on compliant surfaces and resisted gait. Pt responds well to resistanced gait, reinforced that she would benefit from getting in a pool. Pt did well this treatment and would benefit from continued PT in order to progress and met goals.      Rehab Potential  Good    PT Frequency  2x / week    PT Duration  12 weeks    PT Treatment/Interventions  ADLs/Self Care Home  Management;Aquatic Therapy;DME Instruction;Gait training;Stair training;Functional mobility training;Therapeutic activities;Therapeutic exercise;Balance training;Neuromuscular re-education;Patient/family education;Vestibular;Visual/perceptual remediation/compensation;Cognitive remediation;Orthotic Fit/Training;Electrical Stimulation;Cryotherapy;Moist Heat;Manual techniques;Passive range of motion;Taping    PT Next Visit Plan  Utilize compression and weights to decrease ataxia, continue neuromuscular re-education working on weight shifts with Balance Master system.  continue to address dizziness with habituation as needed, progress x1 to x2 ex's as able; if not dizzy: work on yoga poses in standing to address strengtheing/balance, gait on treadmill with pt self propelling belt, gait with no AD with resistance to increase weight shift to left/improve posture, use of Bioness with gait and balance/strengthening ex's. also needs continued work on core strengthening and overall proprioception     Consulted and Agree with Plan of Care  Patient;Family member/caregiver    Family Member Consulted  spouse       Patient will benefit from skilled therapeutic intervention in order to improve the following  deficits and impairments:  Abnormal gait, Decreased balance, Decreased cognition, Decreased coordination, Decreased strength, Difficulty walking, Dizziness, Impaired sensation, Impaired vision/preception, Pain, Decreased activity tolerance, Decreased mobility, Decreased endurance, Postural dysfunction  Visit Diagnosis: Unsteadiness on feet  Ataxic gait  Other abnormalities of gait and mobility  Other lack of coordination     Problem List Patient Active Problem List   Diagnosis Date Noted  . 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 Weakness with dizziness-  since [facility] and CVA 04/07/17 08/07/2017  . Disturbances of vision, late effect of stroke 08/06/2017  . Health education/counseling 08/05/2017  . High risk medications (not anticoagulants) long-term use 08/05/2017  . Neuritis-  R sided:  arm and leg/ body due to stroke 08/05/2017  . Elevated LDL cholesterol level 07/11/2017  . [employer] (NIH) Stroke Scale limb ataxia score 2, ataxia present in two limbs 06/25/2017  . 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 Elevated vitamin B12 level 05/30/2017  . Vitamin D deficiency 05/29/2017  . History of tobacco abuse-  30pk yr hx - quit 04/07/17 05/21/2017  . Gait disturbance, post-stroke 05/14/2017  . Benign essential HTN   . Vitreous hemorrhage of right eye ([place])   . Adjustment disorder with mixed anxiety and depressed mood   . 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 s/p SAH (subarachnoid hemorrhage) ([place]) 04/19/2017  . 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)   . Hypoxia   . Subarachnoid hemorrhage due to ruptured aneurysm ([place]) 04/07/2017  . CVA (cerebral vascular accident) ([place]) 04/07/2017  . Elevated gastrin level 01/25/2012  . Hypokalemia 01/21/2012  . Nausea & vomiting 01/20/2012  . Epigastric pain 01/20/2012  . Duodenal ulcer, acute with obstruction 10/17/2011  . S/P laparoscopic  cholecystectomy 10/16/2011   Halina Andreas, [place] 06/24/2018, 4:51 PM  [facility] [address] [place], [place], [address] Phone: [phone]   Fax:  [phone]  Name: [name] Barnett MRN: [id number] Date of Birth: 1976-05-10  This note has been reviewed and edited by supervising CI.  Willow Ora, PTA, [place] [address], [place], [place] [address] [phone] 06/25/18, 10:13 PM

## 2018-06-24 NOTE — ED Triage Notes (Signed)
Per EMS- Patient tripped on her feet while walking with her walker. Patient has right-sided weakness from a previous stroke. Patient has swelling and minimal bruising to the right ankle. Ice applied.

## 2018-06-24 NOTE — ED Triage Notes (Signed)
Pt fell while using her walker. Pt c/o pain and welling in r/foot, ankle and lower leg. Pt is unable to bear weight on r/foot

## 2018-06-24 NOTE — Discharge Instructions (Addendum)
Your xray showed a:  Acute distal tibia/posterior malleolus nondisplaced fracture. 2. Tiny potentially acute medial malleolus avulsion fracture fragments. 3. Acute nondisplaced mid to distal fibula fracture. . For breakthrough pain you may take Norco. Do not drink alcohol drive or operate heavy machinery when taking. You are being provided a prescription for opiates (also known as narcotics) for pain control on an ?as needed? basis.  Opiates can be addictive and should only be used when absolutely necessary for pain control when other alternatives do not work.  We recommend you only use them for the recommended amount of time and only as prescribed.  Please do not take with other sedative medications or alcohol.  Please do not drive, operate machinery, or make important decisions while taking opiates.  Please note that these medications can be addictive and have high abuse potential.  Please keep these medications locked away from children, teenagers or any family members with history of substance abuse. Additionally, these medications may cause constipation - take over the counter stool softeners or add fiber to your diet to treat this (Metamucil, Psyllium Fiber, Colace, Miralax) Further refills will need to be obtained from your primary care doctor and will not be prescribed through the Emergency Department. You will test positive on most drug tests while taking this medication.   Please follow attached handouts.   If you develop worsening or new concerning symptoms you can return to the emergency department for re-evaluation.

## 2018-06-26 ENCOUNTER — Ambulatory Visit: Payer: BLUE CROSS/BLUE SHIELD | Admitting: Physical Therapy

## 2018-06-26 NOTE — H&P (Signed)
[facility] [address]   [address] [place], [place] [id number] [phone]  A Division of [facility]  RE: [name] Barnett, [name]                                  [id number]         DOB: 10/14/1976 INITIAL EVALUATION 06/25/2018  Reason for visit:  Right ankle injury.   HPI: She is 42 years old.  She has a history of an aneurysm and her balance is not great.  This was a about a year and a few months ago.  She was treated by Dr. Conchita ParisNundkumar for this.  She fell yesterday on 06/24/2018 and suffered a high Weber C ankle fracture trimal equivalent.  She has been elevating it.  This is her good side after the aneurysm and subarachnoid hemorrhage.    OBJECTIVE: The patient is a well appearing female, in no apparent distress.  I did loosen her splint and examine her skin.  Swelling is well controlled at this time.  She is neurovascularly intact.    IMAGES: I reviewed x-rays, which show syndesmosis injury, as well as Weber C trimal equivalent fracture.    ASSESSMENT/PLAN:  I discussed options with her.  I would recommend open reduction and internal fixation of her syndesmosis, possible repair of her deltoid ligament, possible open reduction and internal fixation of her fibula versus closed management of her trimal fracture.  I did discuss the case with Dr. Conchita ParisNundkumar.  He did not have any contraindication to surgery for her.  He was also okay with using aspirin for a month for DVT prophylaxis.      Jewel Baizeimothy D.  Eulah PontMurphy, M.D.  Electronically verified by Jewel Baizeimothy D. Eulah PontMurphy, M.D. TDM:pmw Cc:  Thomasene Loteborah Opalski MD  fax  [phone]  D 06/26/18 T 06/26/18

## 2018-06-27 ENCOUNTER — Other Ambulatory Visit: Payer: Self-pay

## 2018-06-27 ENCOUNTER — Encounter (HOSPITAL_BASED_OUTPATIENT_CLINIC_OR_DEPARTMENT_OTHER): Payer: Self-pay | Admitting: *Deleted

## 2018-06-27 NOTE — Progress Notes (Signed)
Chart reviewed by Dr Hyacinth MeekerMiller, [place] [facility].

## 2018-07-01 ENCOUNTER — Ambulatory Visit (HOSPITAL_BASED_OUTPATIENT_CLINIC_OR_DEPARTMENT_OTHER)
Admission: RE | Admit: 2018-07-01 | Discharge: 2018-07-01 | Disposition: A | Discharge status: Home / Self Care | Payer: BLUE CROSS/BLUE SHIELD | Source: Ambulatory Visit | Attending: Orthopedic Surgery | Admitting: Orthopedic Surgery

## 2018-07-01 ENCOUNTER — Ambulatory Visit (HOSPITAL_BASED_OUTPATIENT_CLINIC_OR_DEPARTMENT_OTHER): Payer: BLUE CROSS/BLUE SHIELD | Admitting: Anesthesiology

## 2018-07-01 ENCOUNTER — Other Ambulatory Visit: Payer: Self-pay

## 2018-07-01 ENCOUNTER — Encounter (HOSPITAL_BASED_OUTPATIENT_CLINIC_OR_DEPARTMENT_OTHER): Payer: Self-pay

## 2018-07-01 ENCOUNTER — Encounter (HOSPITAL_BASED_OUTPATIENT_CLINIC_OR_DEPARTMENT_OTHER)
Admission: RE | Disposition: A | Discharge status: Home / Self Care | Payer: Self-pay | Source: Ambulatory Visit | Attending: Orthopedic Surgery

## 2018-07-01 ENCOUNTER — Ambulatory Visit: Payer: BLUE CROSS/BLUE SHIELD | Admitting: Physical Therapy

## 2018-07-01 ENCOUNTER — Encounter: Payer: Self-pay | Admitting: Physical Therapy

## 2018-07-01 DIAGNOSIS — Z9049 Acquired absence of other specified parts of digestive tract: Secondary | ICD-10-CM | POA: Insufficient documentation

## 2018-07-01 DIAGNOSIS — W010XXA Fall on same level from slipping, tripping and stumbling without subsequent striking against object, initial encounter: Secondary | ICD-10-CM | POA: Insufficient documentation

## 2018-07-01 DIAGNOSIS — Z8679 Personal history of other diseases of the circulatory system: Secondary | ICD-10-CM | POA: Insufficient documentation

## 2018-07-01 DIAGNOSIS — H4900 Third [oculomotor] nerve palsy, unspecified eye: Secondary | ICD-10-CM | POA: Insufficient documentation

## 2018-07-01 DIAGNOSIS — Z96653 Presence of artificial knee joint, bilateral: Secondary | ICD-10-CM | POA: Diagnosis not present

## 2018-07-01 DIAGNOSIS — H532 Diplopia: Secondary | ICD-10-CM | POA: Insufficient documentation

## 2018-07-01 DIAGNOSIS — R2681 Unsteadiness on feet: Secondary | ICD-10-CM | POA: Insufficient documentation

## 2018-07-01 DIAGNOSIS — I69393 Ataxia following cerebral infarction: Secondary | ICD-10-CM | POA: Diagnosis not present

## 2018-07-01 DIAGNOSIS — S82851A Displaced trimalleolar fracture of right lower leg, initial encounter for closed fracture: Secondary | ICD-10-CM | POA: Insufficient documentation

## 2018-07-01 DIAGNOSIS — F17200 Nicotine dependence, unspecified, uncomplicated: Secondary | ICD-10-CM | POA: Insufficient documentation

## 2018-07-01 DIAGNOSIS — Z90710 Acquired absence of both cervix and uterus: Secondary | ICD-10-CM | POA: Insufficient documentation

## 2018-07-01 DIAGNOSIS — Y9301 Activity, walking, marching and hiking: Secondary | ICD-10-CM | POA: Diagnosis not present

## 2018-07-01 DIAGNOSIS — I739 Peripheral vascular disease, unspecified: Secondary | ICD-10-CM | POA: Diagnosis not present

## 2018-07-01 DIAGNOSIS — I10 Essential (primary) hypertension: Secondary | ICD-10-CM | POA: Diagnosis not present

## 2018-07-01 DIAGNOSIS — Z8249 Family history of ischemic heart disease and other diseases of the circulatory system: Secondary | ICD-10-CM | POA: Insufficient documentation

## 2018-07-01 DIAGNOSIS — K279 Peptic ulcer, site unspecified, unspecified as acute or chronic, without hemorrhage or perforation: Secondary | ICD-10-CM | POA: Insufficient documentation

## 2018-07-01 DIAGNOSIS — S82891A Other fracture of right lower leg, initial encounter for closed fracture: Secondary | ICD-10-CM

## 2018-07-01 DIAGNOSIS — K219 Gastro-esophageal reflux disease without esophagitis: Secondary | ICD-10-CM | POA: Insufficient documentation

## 2018-07-01 HISTORY — PX: ORIF ANKLE FRACTURE: SHX5408

## 2018-07-01 HISTORY — DX: Displaced trimalleolar fracture of right lower leg, initial encounter for closed fracture: S82.851A

## 2018-07-01 SURGERY — OPEN REDUCTION INTERNAL FIXATION (ORIF) ANKLE FRACTURE
Anesthesia: Regional | Site: Ankle | Laterality: Right

## 2018-07-01 MED ORDER — BUPIVACAINE-EPINEPHRINE (PF) 0.5% -1:200000 IJ SOLN
INTRAMUSCULAR | Status: DC | PRN
  Administered 2018-07-01 (×2): 20 mL via PERINEURAL

## 2018-07-01 MED ORDER — ACETAMINOPHEN 500 MG PO TABS: 1000.0000 mg | ORAL_TABLET | Freq: Three times a day (TID) | ORAL | 0 refills | Status: AC

## 2018-07-01 MED ORDER — MIDAZOLAM HCL 2 MG/2ML IJ SOLN
1.0000 mg | INTRAMUSCULAR | Status: DC | PRN
  Administered 2018-07-01: 2 mg via INTRAVENOUS

## 2018-07-01 MED ORDER — DEXAMETHASONE SODIUM PHOSPHATE 10 MG/ML IJ SOLN
INTRAMUSCULAR | Status: AC
  Filled 2018-07-01: qty 1

## 2018-07-01 MED ORDER — CEFAZOLIN SODIUM-DEXTROSE 2-4 GM/100ML-% IV SOLN: 2.0000 g | INTRAVENOUS | Status: DC

## 2018-07-01 MED ORDER — LACTATED RINGERS IV SOLN
INTRAVENOUS | Status: DC
  Administered 2018-07-01: 10 mL/h via INTRAVENOUS

## 2018-07-01 MED ORDER — OXYCODONE HCL 5 MG PO TABS: 5.0000 mg | ORAL_TABLET | ORAL | 0 refills | Status: AC | PRN

## 2018-07-01 MED ORDER — FENTANYL CITRATE (PF) 100 MCG/2ML IJ SOLN
INTRAMUSCULAR | Status: AC
  Filled 2018-07-01: qty 2

## 2018-07-01 MED ORDER — ONDANSETRON HCL 4 MG/2ML IJ SOLN: 4.0000 mg | Freq: Four times a day (QID) | INTRAMUSCULAR | Status: DC | PRN

## 2018-07-01 MED ORDER — ONDANSETRON HCL 4 MG PO TABS: 4.0000 mg | ORAL_TABLET | Freq: Three times a day (TID) | ORAL | 0 refills | Status: DC | PRN

## 2018-07-01 MED ORDER — EPHEDRINE SULFATE 50 MG/ML IJ SOLN
INTRAMUSCULAR | Status: DC | PRN
  Administered 2018-07-01: 10 mg via INTRAVENOUS

## 2018-07-01 MED ORDER — ACETAMINOPHEN 500 MG PO TABS
1000.0000 mg | ORAL_TABLET | Freq: Once | ORAL | Status: AC
  Administered 2018-07-01: 1000 mg via ORAL

## 2018-07-01 MED ORDER — FENTANYL CITRATE (PF) 100 MCG/2ML IJ SOLN: 25.0000 ug | INTRAMUSCULAR | Status: DC | PRN

## 2018-07-01 MED ORDER — PROPOFOL 10 MG/ML IV BOLUS
INTRAVENOUS | Status: DC | PRN
  Administered 2018-07-01: 200 mg via INTRAVENOUS

## 2018-07-01 MED ORDER — PROPOFOL 10 MG/ML IV BOLUS
INTRAVENOUS | Status: AC
  Filled 2018-07-01: qty 20

## 2018-07-01 MED ORDER — ONDANSETRON HCL 4 MG/2ML IJ SOLN
INTRAMUSCULAR | Status: AC
  Filled 2018-07-01: qty 2

## 2018-07-01 MED ORDER — DOCUSATE SODIUM 100 MG PO CAPS: 100.0000 mg | ORAL_CAPSULE | Freq: Two times a day (BID) | ORAL | 0 refills | Status: DC

## 2018-07-01 MED ORDER — SCOPOLAMINE 1 MG/3DAYS TD PT72: 1.0000 | MEDICATED_PATCH | Freq: Once | TRANSDERMAL | Status: DC | PRN

## 2018-07-01 MED ORDER — LACTATED RINGERS IV SOLN
INTRAVENOUS | Status: DC
  Administered 2018-07-01 (×2): via INTRAVENOUS

## 2018-07-01 MED ORDER — METHOCARBAMOL 500 MG PO TABS: 500.0000 mg | ORAL_TABLET | Freq: Two times a day (BID) | ORAL | 0 refills | Status: DC | PRN

## 2018-07-01 MED ORDER — CHLORHEXIDINE GLUCONATE 4 % EX LIQD: 60.0000 mL | Freq: Once | CUTANEOUS | Status: DC

## 2018-07-01 MED ORDER — OXYCODONE HCL 5 MG PO TABS: 5.0000 mg | ORAL_TABLET | Freq: Once | ORAL | Status: DC | PRN

## 2018-07-01 MED ORDER — OXYCODONE HCL 5 MG/5ML PO SOLN: 5.0000 mg | Freq: Once | ORAL | Status: DC | PRN

## 2018-07-01 MED ORDER — CEFAZOLIN SODIUM-DEXTROSE 2-3 GM-%(50ML) IV SOLR
INTRAVENOUS | Status: DC | PRN
  Administered 2018-07-01: 2 g via INTRAVENOUS

## 2018-07-01 MED ORDER — PHENYLEPHRINE HCL 10 MG/ML IJ SOLN
INTRAMUSCULAR | Status: DC | PRN
  Administered 2018-07-01 (×3): 80 ug via INTRAVENOUS
  Administered 2018-07-01: 120 ug via INTRAVENOUS

## 2018-07-01 MED ORDER — ASPIRIN EC 81 MG PO TBEC: 81.0000 mg | DELAYED_RELEASE_TABLET | Freq: Every day | ORAL | 0 refills | Status: AC

## 2018-07-01 MED ORDER — CEFAZOLIN SODIUM-DEXTROSE 2-4 GM/100ML-% IV SOLN
INTRAVENOUS | Status: AC
  Filled 2018-07-01: qty 100

## 2018-07-01 MED ORDER — FENTANYL CITRATE (PF) 100 MCG/2ML IJ SOLN
50.0000 ug | INTRAMUSCULAR | Status: DC | PRN
  Administered 2018-07-01: 100 ug via INTRAVENOUS

## 2018-07-01 MED ORDER — ACETAMINOPHEN 500 MG PO TABS
ORAL_TABLET | ORAL | Status: AC
  Filled 2018-07-01: qty 2

## 2018-07-01 MED ORDER — LIDOCAINE HCL (CARDIAC) PF 100 MG/5ML IV SOSY
PREFILLED_SYRINGE | INTRAVENOUS | Status: DC | PRN
  Administered 2018-07-01: 100 mg via INTRAVENOUS

## 2018-07-01 MED ORDER — GABAPENTIN 300 MG PO CAPS
ORAL_CAPSULE | ORAL | Status: AC
  Filled 2018-07-01: qty 1

## 2018-07-01 MED ORDER — GABAPENTIN 300 MG PO CAPS: 300.0000 mg | ORAL_CAPSULE | Freq: Once | ORAL | Status: DC

## 2018-07-01 MED ORDER — LIDOCAINE HCL (CARDIAC) PF 100 MG/5ML IV SOSY
PREFILLED_SYRINGE | INTRAVENOUS | Status: AC
  Filled 2018-07-01: qty 5

## 2018-07-01 MED ORDER — MIDAZOLAM HCL 2 MG/2ML IJ SOLN
INTRAMUSCULAR | Status: AC
  Filled 2018-07-01: qty 2

## 2018-07-01 SURGICAL SUPPLY — 67 items
ANCHOR SUT 1.8 FBRTK KNTLS 2SU (Anchor) ×4 IMPLANT
BANDAGE ACE 4X5 VEL STRL LF (GAUZE/BANDAGES/DRESSINGS) ×2 IMPLANT
BANDAGE ACE 6X5 VEL STRL LF (GAUZE/BANDAGES/DRESSINGS) ×2 IMPLANT
BANDAGE ESMARK 6X9 LF (GAUZE/BANDAGES/DRESSINGS) ×1 IMPLANT
BLADE SURG 15 STRL LF DISP TIS (BLADE) ×2 IMPLANT
BLADE SURG 15 STRL SS (BLADE) ×2
BNDG CMPR 9X6 STRL LF SNTH (GAUZE/BANDAGES/DRESSINGS) ×1
BNDG COHESIVE 4X5 TAN STRL (GAUZE/BANDAGES/DRESSINGS) ×2 IMPLANT
BNDG ESMARK 6X9 LF (GAUZE/BANDAGES/DRESSINGS) ×2
CHLORAPREP W/TINT 26ML (MISCELLANEOUS) ×2 IMPLANT
CLSR STERI-STRIP ANTIMIC 1/2X4 (GAUZE/BANDAGES/DRESSINGS) ×2 IMPLANT
COVER BACK TABLE 60X90IN (DRAPES) ×2 IMPLANT
CUFF TOURNIQUET SINGLE 24IN (TOURNIQUET CUFF) IMPLANT
CUFF TOURNIQUET SINGLE 34IN LL (TOURNIQUET CUFF) ×2 IMPLANT
DECANTER SPIKE VIAL GLASS SM (MISCELLANEOUS) IMPLANT
DRAPE EXTREMITY T 121X128X90 (DRAPE) ×2 IMPLANT
DRAPE IMP U-DRAPE 54X76 (DRAPES) ×2 IMPLANT
DRAPE OEC MINIVIEW 54X84 (DRAPES) ×2 IMPLANT
DRAPE U-SHAPE 47X51 STRL (DRAPES) ×2 IMPLANT
DRSG EMULSION OIL 3X3 NADH (GAUZE/BANDAGES/DRESSINGS) ×2 IMPLANT
DRSG PAD ABDOMINAL 8X10 ST (GAUZE/BANDAGES/DRESSINGS) ×2 IMPLANT
ELECT REM PT RETURN 9FT ADLT (ELECTROSURGICAL) ×2
ELECTRODE REM PT RTRN 9FT ADLT (ELECTROSURGICAL) ×1 IMPLANT
GAUZE SPONGE 4X4 12PLY STRL (GAUZE/BANDAGES/DRESSINGS) ×2 IMPLANT
GLOVE BIO SURGEON STRL SZ7 (GLOVE) ×2 IMPLANT
GLOVE BIO SURGEON STRL SZ7.5 (GLOVE) ×4 IMPLANT
GLOVE BIOGEL PI IND STRL 6.5 (GLOVE) ×1 IMPLANT
GLOVE BIOGEL PI IND STRL 8 (GLOVE) ×2 IMPLANT
GLOVE BIOGEL PI INDICATOR 6.5 (GLOVE) ×1
GLOVE BIOGEL PI INDICATOR 8 (GLOVE) ×2
GOWN STRL REUS W/ TWL LRG LVL3 (GOWN DISPOSABLE) ×1 IMPLANT
GOWN STRL REUS W/ TWL XL LVL3 (GOWN DISPOSABLE) ×2 IMPLANT
GOWN STRL REUS W/TWL LRG LVL3 (GOWN DISPOSABLE) ×1
GOWN STRL REUS W/TWL XL LVL3 (GOWN DISPOSABLE) ×2
KIT STR SPEAR 1.8 FBRTK DISP (KITS) ×2 IMPLANT
NEEDLE 1/2 CIR CATGUT .05X1.09 (NEEDLE) ×2 IMPLANT
NEEDLE HYPO 22GX1.5 SAFETY (NEEDLE) IMPLANT
NS IRRIG 1000ML POUR BTL (IV SOLUTION) ×2 IMPLANT
PACK BASIN DAY SURGERY FS (CUSTOM PROCEDURE TRAY) ×2 IMPLANT
PAD CAST 4YDX4 CTTN HI CHSV (CAST SUPPLIES) ×1 IMPLANT
PADDING CAST ABS 4INX4YD NS (CAST SUPPLIES) ×2
PADDING CAST ABS COTTON 4X4 ST (CAST SUPPLIES) ×2 IMPLANT
PADDING CAST COTTON 4X4 STRL (CAST SUPPLIES) ×1
PADDING CAST COTTON 6X4 STRL (CAST SUPPLIES) ×2 IMPLANT
PENCIL BUTTON HOLSTER BLD 10FT (ELECTRODE) ×2 IMPLANT
PLATE TWO HOLE TITANIUM (Plate) ×2 IMPLANT
SLEEVE SCD COMPRESS KNEE MED (MISCELLANEOUS) ×2 IMPLANT
SPLINT FAST PLASTER 5X30 (CAST SUPPLIES) ×20
SPLINT PLASTER CAST FAST 5X30 (CAST SUPPLIES) ×20 IMPLANT
SPONGE LAP 4X18 RFD (DISPOSABLE) ×2 IMPLANT
SUCTION FRAZIER HANDLE 10FR (MISCELLANEOUS) ×1
SUCTION TUBE FRAZIER 10FR DISP (MISCELLANEOUS) ×1 IMPLANT
SUT ETHILON 3 0 PS 1 (SUTURE) ×2 IMPLANT
SUT MNCRL AB 4-0 PS2 18 (SUTURE) IMPLANT
SUT MON AB 2-0 CT1 36 (SUTURE) IMPLANT
SUT MON AB 3-0 SH 27 (SUTURE)
SUT MON AB 3-0 SH27 (SUTURE) IMPLANT
SUT VIC AB 0 SH 27 (SUTURE) ×2 IMPLANT
SUT VIC AB 2-0 SH 27 (SUTURE)
SUT VIC AB 2-0 SH 27XBRD (SUTURE) IMPLANT
SYNDESMOSIS TIGHTROPE XP (Orthopedic Implant) ×4 IMPLANT
SYR BULB 3OZ (MISCELLANEOUS) ×2 IMPLANT
SYR CONTROL 10ML LL (SYRINGE) IMPLANT
TOWEL GREEN STERILE FF (TOWEL DISPOSABLE) ×4 IMPLANT
TOWEL OR NON WOVEN STRL DISP B (DISPOSABLE) ×2 IMPLANT
TUBE CONNECTING 20X1/4 (TUBING) ×2 IMPLANT
UNDERPAD 30X30 (UNDERPADS AND DIAPERS) ×2 IMPLANT

## 2018-07-01 NOTE — Therapy (Signed)
[facility] [address] [place], [place], [address] Phone: [phone]   Fax:  [phone]  Patient Details  Name: [name] Barnett MRN: [id number] Date of Birth: 1976/09/10 Referring Provider:  No ref. provider found  Encounter Date: 07/01/2018  PHYSICAL THERAPY DISCHARGE SUMMARY  Visits from Start of Care: 77  Current functional level related to goals / functional outcomes: Pt therapy continued to address balance, coordination and gait impairments and pt was making good progress towards goals but experienced a fall at home while using RW resulting in R tib/fib fracture.  Pt to be D/C from therapy at this time until fracture heals and pt is cleared by physician to return to therapy.   Remaining deficits: Impaired vestibular system, impaired coordination, balance, gait, multiple falls   Education / Equipment: HEP  Plan: Patient agrees to discharge.  Patient goals were not met. Patient is being discharged due to a change in medical status.  ?????     Rico Junker, PT, DPT 07/01/18    1:14 PM    [facility] [address] [place], [place], [address] Phone: [phone]   Fax:  [phone]

## 2018-07-01 NOTE — Anesthesia Procedure Notes (Signed)
Anesthesia Regional Block: Adductor canal block   Pre-Anesthetic Checklist: ,, timeout performed, Correct Patient, Correct Site, Correct Laterality, Correct Procedure, Correct Position, site marked, Risks and benefits discussed,  Surgical consent,  Pre-op evaluation,  At surgeon's request and post-op pain management  Laterality: Right  Prep: chloraprep       Needles:  Injection technique: Single-shot  Needle Type: Echogenic Needle     Needle Length: 9cm  Needle Gauge: 21     Additional Needles:   Narrative:  Start time: 07/01/2018 8:17 AM End time: 07/01/2018 8:22 AM Injection made incrementally with aspirations every 5 mL.  Performed by: Personally  Anesthesiologist: Achille RichHodierne, Leiland Mihelich, MD  Additional Notes: Pt tolerated the procedure well.

## 2018-07-01 NOTE — Discharge Instructions (Signed)
No Tylenol until 1:30pm!   Post Anesthesia Home Care Instructions  Activity: Get plenty of rest for the remainder of the day. A responsible individual must stay with you for 24 hours following the procedure.  For the next 24 hours, DO NOT: -Drive a car -Advertising copywriterperate machinery -Drink alcoholic beverages -Take any medication unless instructed by your physician -Make any legal decisions or sign important papers.  Meals: Start with liquid foods such as gelatin or soup. Progress to regular foods as tolerated. Avoid greasy, spicy, heavy foods. If nausea and/or vomiting occur, drink only clear liquids until the nausea and/or vomiting subsides. Call your physician if vomiting continues.  Special Instructions/Symptoms: Your throat may feel dry or sore from the anesthesia or the breathing tube placed in your throat during surgery. If this causes discomfort, gargle with warm salt water. The discomfort should disappear within 24 hours.  If you had a scopolamine patch placed behind your ear for the management of post- operative nausea and/or vomiting:  1. The medication in the patch is effective for 72 hours, after which it should be removed.  Wrap patch in a tissue and discard in the trash. Wash hands thoroughly with soap and water. 2. You may remove the patch earlier than 72 hours if you experience unpleasant side effects which may include dry mouth, dizziness or visual disturbances. 3. Avoid touching the patch. Wash your hands with soap and water after contact with the patch.   Elevate leg - Toes above nose as much as possible to reduce pain / swelling. If needed, you may increase pain medication for the first few days post op to 2 tablets every 4 hours.  Stop taking oxycodone as soon as you are able.  You may loosen and re-apply ace wrap if it feels too tight.  Weight Bearing:  Non weight bearing affected leg.  Diet: As you were doing prior to hospitalization   Shower:  You have a splint on,  leave the splint in place and keep the splint dry with a plastic bag.  Dressing:  You have a splint. Leave the splint in place and we will change your bandages during your first follow-up appointment.       Activity:  Increase activity slowly as tolerated, but follow the weight bearing instructions below.  The rules on driving is that you can not be taking narcotics while you drive, and you must feel in control of the vehicle.    To prevent constipation:  Narcotic medicines cause constipation.  Wean these as soon as is appropriate.   You may use a stool softener such as -  Colace (over the counter) 100 mg by mouth twice a day  Drink plenty of fluids (prune juice may be helpful) and high fiber foods Miralax (over the counter) for constipation as needed.    Itching:  If you experience itching with your medications, try taking only a single pain pill, or even half a pain pill at a time.  You can also use benadryl over the counter for itching or also to help with sleep.   Precautions:  If you experience chest pain or shortness of breath - call 911 immediately for transfer to the hospital emergency department!!  If you develop a fever greater that 101 F, purulent drainage from wound, increased redness or drainage from wound, or calf pain -- Call the office at [phone]  Follow- Up Appointment:  Please call for an appointment to be seen in 2 weeks [place] - [phone]

## 2018-07-01 NOTE — Anesthesia Procedure Notes (Signed)
Anesthesia Regional Block: Popliteal block   Pre-Anesthetic Checklist: ,, timeout performed, Correct Patient, Correct Site, Correct Laterality, Correct Procedure, Correct Position, site marked, Risks and benefits discussed,  Surgical consent,  Pre-op evaluation,  At surgeon's request and post-op pain management  Laterality: Right  Prep: chloraprep       Needles:  Injection technique: Single-shot  Needle Type: Echogenic Stimulator Needle          Additional Needles:   Procedures:, nerve stimulator,,,,,,,   Nerve Stimulator or Paresthesia:  Response: plantar flexion of foot, 0.45 mA,   Additional Responses:   Narrative:  Start time: 07/01/2018 8:11 AM End time: 07/01/2018 8:16 AM Injection made incrementally with aspirations every 5 mL.  Performed by: Personally  Anesthesiologist: Achille RichHodierne, Kobie Whidby, MD  Additional Notes: Functioning IV was confirmed and monitors were applied.  A 90mm 21ga Arrow echogenic stimulator needle was used. Sterile prep and drape,hand hygiene and sterile gloves were used.  Negative aspiration and negative test dose prior to incremental administration of local anesthetic. The patient tolerated the procedure well.  Ultrasound guidance: relevent anatomy identified, needle position confirmed, local anesthetic spread visualized around nerve(s), vascular puncture avoided.  Image printed for medical record.

## 2018-07-01 NOTE — Transfer of Care (Signed)
Immediate Anesthesia Transfer of Care Note  Patient: [name] Barnett  Procedure(s) Performed: RIGHT ANKLE FRACTURE OPEN TREATMENT TRIMALLEOLAR INCLUDES INTERNAL FIXATION WITHOUT FIXATION OF POSTERIOR LIP, FRACTURE CLOSED TREATMENT TRIMALLEOLAR ANKLE WITH MANIPULATION, REPAIR SYNDESMOSIS, REPAIR DELTOID LIGAMENT (Right Ankle)  Patient Location: PACU  Anesthesia Type:General  Level of Consciousness: awake, alert  and oriented  Airway & Oxygen Therapy: Patient Spontanous Breathing and Patient connected to face mask oxygen  Post-op Assessment: Report given to RN and Post -op Vital signs reviewed and stable  Post vital signs: Reviewed and stable  Last Vitals:  Vitals Value Taken Time  BP    Temp    Pulse    Resp    SpO2      Last Pain:  Vitals:   07/01/18 0713  TempSrc: Oral  PainSc: 5          Complications: No apparent anesthesia complications

## 2018-07-01 NOTE — Anesthesia Procedure Notes (Signed)
Procedure Name: LMA Insertion Performed by: Rahim Astorga M, CRNA Pre-anesthesia Checklist: Patient identified, Emergency Drugs available, Suction available, Patient being monitored and Timeout performed Patient Re-evaluated:Patient Re-evaluated prior to induction Oxygen Delivery Method: Circle system utilized Preoxygenation: Pre-oxygenation with 100% oxygen Induction Type: IV induction LMA: LMA inserted LMA Size: 4.0 Tube type: Oral Number of attempts: 1 Placement Confirmation: positive ETCO2,  CO2 detector and breath sounds checked- equal and bilateral Tube secured with: Tape Dental Injury: Teeth and Oropharynx as per pre-operative assessment        

## 2018-07-01 NOTE — Anesthesia Preprocedure Evaluation (Signed)
Anesthesia Evaluation  Patient identified by MRN, date of birth, ID band Patient awake    Reviewed: Allergy & Precautions, H&P , NPO status , Patient's Chart, lab work & pertinent test results  Airway Mallampati: II   Neck ROM: full    Dental   Pulmonary Current Smoker,    breath sounds clear to auscultation       Cardiovascular hypertension, + Peripheral Vascular Disease   Rhythm:regular Rate:Normal     Neuro/Psych CVA    GI/Hepatic PUD, GERD  ,  Endo/Other    Renal/GU      Musculoskeletal Trimalleolar ankle fx   Abdominal   Peds  Hematology   Anesthesia Other Findings   Reproductive/Obstetrics                             Anesthesia Physical Anesthesia Plan  ASA: III  Anesthesia Plan: General and Regional   Post-op Pain Management:  Regional for Post-op pain   Induction: Intravenous  PONV Risk Score and Plan: 2 and Ondansetron, Dexamethasone, Treatment may vary due to age or medical condition and Midazolam  Airway Management Planned: LMA  Additional Equipment:   Intra-op Plan:   Post-operative Plan:   Informed Consent: I have reviewed the patients History and Physical, chart, labs and discussed the procedure including the risks, benefits and alternatives for the proposed anesthesia with the patient or authorized representative who has indicated his/her understanding and acceptance.     Plan Discussed with: CRNA, Anesthesiologist and Surgeon  Anesthesia Plan Comments:         Anesthesia Quick Evaluation

## 2018-07-01 NOTE — Progress Notes (Signed)
Assisted Dr. Hodierne with right, ultrasound guided, popliteal, adductor canal block. Side rails up, monitors on throughout procedure. See vital signs in flow sheet. Tolerated Procedure well. 

## 2018-07-01 NOTE — Interval H&P Note (Signed)
History and Physical Interval Note:  07/01/2018 7:40 AM  [name] Barnett  has presented today for surgery, with the diagnosis of RIGHT ANKLE DISPLACED TRIMALLEOLAR FRACTURE  The various methods of treatment have been discussed with the patient and family. After consideration of risks, benefits and other options for treatment, the patient has consented to  Procedure(s): RIGHT ANKLE FRACTURE OPEN TREATMENT TRIMALLEOLAR INCLUDES INTERAL FIXATION WITHOUT FIXATION OF POSTERIOR LIP, FRACTURE CLOSED TREATMENT TRIMALLEOLAR ANKLE WITH MANIPULATION, FRACTURE OPEN TREATMENT DISTAL TIBIOFIBULAR INCLUDES INTERAL FIXATION, REPAIR PRIMARY DISUPTED LIGAMENT ANKEL COLLATERAL (Right) as a surgical intervention .  The patient's history has been reviewed, patient examined, no change in status, stable for surgery.  I have reviewed the patient's chart and labs.  Questions were answered to the patient's satisfaction.     Ernst Cumpston D

## 2018-07-02 ENCOUNTER — Encounter (HOSPITAL_BASED_OUTPATIENT_CLINIC_OR_DEPARTMENT_OTHER): Payer: Self-pay | Admitting: Orthopedic Surgery

## 2018-07-02 ENCOUNTER — Other Ambulatory Visit: Payer: Self-pay | Admitting: Family Medicine

## 2018-07-02 DIAGNOSIS — F4323 Adjustment disorder with mixed anxiety and depressed mood: Secondary | ICD-10-CM

## 2018-07-02 NOTE — Addendum Note (Signed)
Addendum  created 07/02/18 0836 by Lance CoonWebster, [place], CRNA   Charge Capture section accepted

## 2018-07-02 NOTE — Anesthesia Postprocedure Evaluation (Signed)
Anesthesia Post Note  Patient: Pension scheme managerAmber F Windholz  Procedure(s) Performed: RIGHT ANKLE FRACTURE OPEN TREATMENT TRIMALLEOLAR INCLUDES INTERNAL FIXATION WITHOUT FIXATION OF POSTERIOR LIP, FRACTURE CLOSED TREATMENT TRIMALLEOLAR ANKLE WITH MANIPULATION, REPAIR SYNDESMOSIS, REPAIR DELTOID LIGAMENT (Right Ankle)     Patient location during evaluation: PACU Anesthesia Type: Regional and General Level of consciousness: awake and alert Pain management: pain level controlled Vital Signs Assessment: post-procedure vital signs reviewed and stable Respiratory status: spontaneous breathing, nonlabored ventilation, respiratory function stable and patient connected to nasal cannula oxygen Cardiovascular status: blood pressure returned to baseline and stable Postop Assessment: no apparent nausea or vomiting Anesthetic complications: no    Last Vitals:  Vitals:   07/01/18 1115 07/01/18 1145  BP: (!) 118/59 126/63  Pulse: 88 91  Resp: 12 16  Temp:  36.5 C  SpO2: 98% 97%    Last Pain:  Vitals:   07/01/18 1145  TempSrc:   PainSc: 0-No pain   Pain Goal:                 Jarian Longoria S

## 2018-07-03 ENCOUNTER — Ambulatory Visit: Payer: BLUE CROSS/BLUE SHIELD | Admitting: Physical Therapy

## 2018-07-03 NOTE — Op Note (Signed)
07/01/2018  12:47 PM  PATIENT:  [name] Barnett    PRE-OPERATIVE DIAGNOSIS:  RIGHT ANKLE DISPLACED TRIMALLEOLAR FRACTURE  POST-OPERATIVE DIAGNOSIS:  Same  PROCEDURE:  RIGHT ANKLE FRACTURE OPEN TREATMENT TRIMALLEOLAR INCLUDES INTERNAL FIXATION WITHOUT FIXATION OF POSTERIOR LIP, FRACTURE CLOSED TREATMENT TRIMALLEOLAR ANKLE WITH MANIPULATION, REPAIR SYNDESMOSIS, REPAIR DELTOID LIGAMENT  SURGEON:  Aella Ronda D, MD  ASSISTANT: Aquilla HackerHenry Martensen, PA-C, he was present and scrubbed throughout the case, critical for completion in a timely fashion, and for retraction, instrumentation, and closure.   ANESTHESIA:   gen  PREOPERATIVE INDICATIONS:  [name] Barnett is a  42 y.o. female with a diagnosis of RIGHT ANKLE DISPLACED TRIMALLEOLAR FRACTURE who failed conservative measures and elected for surgical management.    The risks benefits and alternatives were discussed with the patient preoperatively including but not limited to the risks of infection, bleeding, nerve injury, cardiopulmonary complications, the need for revision surgery, among others, and the patient was willing to proceed.  OPERATIVE IMPLANTS: Arthrex dual tight rope plate and medial suture only anchors  OPERATIVE FINDINGS: Unstable ankle fracture. Stable syndesmosis post op  BLOOD LOSS: min  COMPLICATIONS: none  TOURNIQUET TIME: 60min  OPERATIVE PROCEDURE:  Patient was identified in the preoperative holding area and site was marked by me He was transported to the operating theater and placed on the table in supine position taking care to pad all bony prominences. After a preincinduction time out anesthesia was induced. The right lower extremity was prepped and draped in normal sterile fashion and a pre-incision timeout was performed. Jesusita F Baillargeon received ancef for preoperative antibiotics.   I used fluoroscopy to assess her ankle there is significant widening of her medial space as well as talar tilt the fibular fracture was  stable and length was stable so I elected not to perform ORIF of the high fibular fracture that we started by doing an ORIF of her syndesmosis.  I made a lateral incision dissected down to her fibula place fluoroscopic guidance to drill quad cortical passed through the fibula and tibia after reducing the fibula into the incisura.  I passed the first tight rope and was able to since this down to near complete tightness I then passed a second tight rope through the plate above this in a more anterior fashion.  Once passing this when I was able to send that one down I sequentially tightened these and was very happy with the reduction of her syndesmosis instability here.  Next I reassessed her talar tilt it was significant showing significant laxity on the medial side so I elected to make a medial incision I protected her saphenous vein dissected down to her medial mall and was able to identify deltoid ligament avulsed from her medial mall.  I placed to all suture anchors in the medial mall and passed the stitches through the deltoid ligament was then able to cement since these up to the medial mall with excellent apposition.  As happy with the talar tilt and stability of her try mall equivalent fracture after these manipulations.  The wound was then thoroughly irrigated and closed using a 0 Vicryl and absorbable Monocryl sutures. He was placed in a short leg splint.   POST OPERATIVE PLAN: Non-weightbearing. DVT prophylaxis will consist of mobilization and ASA

## 2018-07-08 ENCOUNTER — Ambulatory Visit: Payer: BLUE CROSS/BLUE SHIELD | Admitting: Physical Therapy

## 2018-07-10 ENCOUNTER — Ambulatory Visit: Payer: BLUE CROSS/BLUE SHIELD | Admitting: Physical Therapy

## 2018-07-15 ENCOUNTER — Ambulatory Visit: Payer: BLUE CROSS/BLUE SHIELD | Admitting: Physical Therapy

## 2018-07-17 ENCOUNTER — Ambulatory Visit: Payer: BLUE CROSS/BLUE SHIELD | Admitting: Physical Therapy

## 2018-07-21 ENCOUNTER — Ambulatory Visit: Payer: BLUE CROSS/BLUE SHIELD | Admitting: Family Medicine

## 2018-07-22 ENCOUNTER — Ambulatory Visit: Payer: BLUE CROSS/BLUE SHIELD | Admitting: Physical Therapy

## 2018-07-24 ENCOUNTER — Ambulatory Visit: Payer: BLUE CROSS/BLUE SHIELD | Admitting: Physical Therapy

## 2018-07-29 ENCOUNTER — Ambulatory Visit: Payer: BLUE CROSS/BLUE SHIELD | Admitting: Physical Therapy

## 2018-07-31 ENCOUNTER — Ambulatory Visit: Payer: BLUE CROSS/BLUE SHIELD | Admitting: Physical Therapy

## 2018-08-05 ENCOUNTER — Ambulatory Visit: Payer: BLUE CROSS/BLUE SHIELD | Admitting: Physical Therapy

## 2018-08-07 ENCOUNTER — Ambulatory Visit: Payer: BLUE CROSS/BLUE SHIELD | Admitting: Physical Therapy

## 2018-08-12 ENCOUNTER — Ambulatory Visit: Payer: BLUE CROSS/BLUE SHIELD | Admitting: Physical Medicine & Rehabilitation

## 2018-08-12 ENCOUNTER — Ambulatory Visit (HOSPITAL_BASED_OUTPATIENT_CLINIC_OR_DEPARTMENT_OTHER): Payer: BLUE CROSS/BLUE SHIELD | Admitting: Physical Medicine & Rehabilitation

## 2018-08-12 ENCOUNTER — Encounter: Payer: BLUE CROSS/BLUE SHIELD | Attending: Physical Medicine & Rehabilitation

## 2018-08-12 ENCOUNTER — Encounter: Payer: Self-pay | Admitting: Physical Medicine & Rehabilitation

## 2018-08-12 VITALS — BP 136/90 | HR 82 | Ht 64.0 in | Wt 170.0 lb

## 2018-08-12 DIAGNOSIS — M1611 Unilateral primary osteoarthritis, right hip: Secondary | ICD-10-CM

## 2018-08-12 DIAGNOSIS — R42 Dizziness and giddiness: Secondary | ICD-10-CM | POA: Insufficient documentation

## 2018-08-12 DIAGNOSIS — I725 Aneurysm of other precerebral arteries: Secondary | ICD-10-CM | POA: Diagnosis present

## 2018-08-12 DIAGNOSIS — I69093 Ataxia following nontraumatic subarachnoid hemorrhage: Secondary | ICD-10-CM | POA: Diagnosis not present

## 2018-08-12 NOTE — Patient Instructions (Signed)
Hip injection with celestone and lidocaine

## 2018-08-12 NOTE — Progress Notes (Signed)
hip intra-articular injection under fluoro guidance  Moderate Right hip osteoarthritis with pain that limits mobility,, unresponsive to medication management.  Informed consent was obtained after describing risks and benefits of the procedure with patient . Patient has given written consent  Patient placed supine on exam table. Fluoro spot images obtained The femoral head was identified as well as this femoral neck. Area was marked then prepped with Betadine, sterile drape  A 25-gauge 1.5 inch needle was used to anesthetize the skin and subcutaneous tissue with 3 cc of 1% lidocaine under direct ultrasound visualization. Then a 22-gauge 3.5 in Quincke needle was inserted under direct ultrasound visualization targeting the junction of the femoral neck and the femoral head. Once bone contact was made Isovue 200 x 2 ml were injected demonstrating good capsular speard. THen a solution containing 1 cc of 6 mg/cc betamethasone and 4 cc of 1% lidocaine was  injected  Patient tolerated procedure well Images saved

## 2018-08-12 NOTE — Progress Notes (Signed)
  PROCEDURE RECORD [facility]   Name: [name] Barnett DOB:1976/02/08 MRN: [id number]  Date:08/12/2018  Physician: Claudette Laws, MD    Nurse/CMA: Debborah Alonge CMA   Allergies:  Allergies  Allergen Reactions  . 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    Is patient diabetic? No.  CBG today? na  Pregnant: No. LMP: No LMP recorded. (age 5-55)  Anticoagulants: no Anti-inflammatory: no Antibiotics: no  Procedure: Right Hip Intra-Articular Injections Position: Supine  Start Time: 301pm End Time: 305pm Fluoro Time: 10s  RN/CMA Darenda Fike CMA Kallan Bischoff CMA    Time 243pm 3:10pm    BP 136/90 138/86    Pulse 82 87    Respirations 16 16    O2 Sat 97 97    S/S 6 6    Pain Level 3/10 0/10     D/C home with Husband, patient A & O X 3, D/C instructions reviewed, and sits independently.

## 2018-09-09 ENCOUNTER — Encounter: Payer: Self-pay | Admitting: Physical Therapy

## 2018-09-09 ENCOUNTER — Ambulatory Visit: Payer: BLUE CROSS/BLUE SHIELD | Attending: Orthopedic Surgery | Admitting: Physical Therapy

## 2018-09-09 ENCOUNTER — Other Ambulatory Visit: Payer: Self-pay

## 2018-09-09 DIAGNOSIS — R278 Other lack of coordination: Secondary | ICD-10-CM | POA: Diagnosis present

## 2018-09-09 DIAGNOSIS — R2689 Other abnormalities of gait and mobility: Secondary | ICD-10-CM | POA: Diagnosis present

## 2018-09-09 DIAGNOSIS — R208 Other disturbances of skin sensation: Secondary | ICD-10-CM | POA: Insufficient documentation

## 2018-09-09 DIAGNOSIS — R260 Ataxic gait: Secondary | ICD-10-CM | POA: Insufficient documentation

## 2018-09-09 DIAGNOSIS — I69054 Hemiplegia and hemiparesis following nontraumatic subarachnoid hemorrhage affecting left non-dominant side: Secondary | ICD-10-CM | POA: Insufficient documentation

## 2018-09-09 DIAGNOSIS — R2681 Unsteadiness on feet: Secondary | ICD-10-CM | POA: Diagnosis not present

## 2018-09-09 NOTE — Therapy (Signed)
[facility] [address] [place], [place], [address] Phone: [phone]   Fax:  [phone]  Physical Therapy Evaluation  Patient Details  Name: DONNA SNOOKS MRN: [id number] Date of Birth: 1976/02/11 Referring Provider (PT): Margarita Rana, MD   Encounter Date: 09/09/2018  PT End of Session - 09/09/18 1557    Visit Number  1    Number of Visits  17    Date for PT Re-Evaluation  11/08/18    Authorization Type  [employer]    PT Start Time  1454    PT Stop Time  1530    PT Time Calculation (min)  36 min    Equipment Utilized During Treatment  Gait belt    Activity Tolerance  Patient tolerated treatment well    Behavior During Therapy  [facility] for tasks assessed/performed       Past Medical History:  Diagnosis Date  . Duodenal obstruction   . GERD (gastroesophageal reflux disease)   . Hypertension   . Stroke [facility])    april 2018, lt sided weakness  . Trimalleolar fracture of ankle, closed, right, initial encounter     Past Surgical History:  Procedure Laterality Date  . BALLOON DILATION  12/31/2011   Procedure: BALLOON DILATION;  Surgeon: Barrie Folk, MD;  Location: [facility] ENDOSCOPY;  Service: Endoscopy;  Laterality: N/A;  . CHOLECYSTECTOMY  07/28/11  . ESOPHAGOGASTRODUODENOSCOPY  10/17/2011   Procedure: ESOPHAGOGASTRODUODENOSCOPY (EGD);  Surgeon: Barrie Folk, MD;  Location: [facility] ENDOSCOPY;  Service: Endoscopy;  Laterality: N/A;  . IR ANGIO INTRA EXTRACRAN SEL INTERNAL CAROTID BILAT MOD SED  04/07/2017  . IR ANGIO INTRA EXTRACRAN SEL INTERNAL CAROTID BILAT MOD SED  01/07/2018  . IR ANGIO VERTEBRAL SEL VERTEBRAL UNI R MOD SED  04/07/2017  . IR ANGIO VERTEBRAL SEL VERTEBRAL UNI R MOD SED  01/07/2018  . IR ANGIOGRAM FOLLOW UP STUDY  04/07/2017  . IR ANGIOGRAM FOLLOW UP STUDY  04/07/2017  . IR ANGIOGRAM FOLLOW UP STUDY  04/07/2017  . IR ANGIOGRAM FOLLOW UP STUDY  04/07/2017  . IR ANGIOGRAM FOLLOW UP STUDY  04/07/2017  . IR ANGIOGRAM  SELECTIVE EACH ADDITIONAL VESSEL  04/07/2017  . IR TRANSCATH/EMBOLIZ  04/07/2017  . IR US GUIDE VASC ACCESS RIGHT  01/07/2018  . ORIF ANKLE FRACTURE Right 07/01/2018   Procedure: RIGHT ANKLE FRACTURE OPEN TREATMENT TRIMALLEOLAR INCLUDES INTERNAL FIXATION WITHOUT FIXATION OF POSTERIOR LIP, FRACTURE CLOSED TREATMENT TRIMALLEOLAR ANKLE WITH MANIPULATION, REPAIR SYNDESMOSIS, REPAIR DELTOID LIGAMENT;  Surgeon: Sheral Apley, MD;  Location: [facility];  Service: Orthopedics;  Laterality: Right;  . PARS PLANA VITRECTOMY Right 05/01/2017   Procedure: PARS PLANA VITRECTOMY WITH 25 GAUGE RIGHT EYE, endolaser photocoaglation;  Surgeon: Carmela Rima, MD;  Location: [facility] OR;  Service: Ophthalmology;  Laterality: Right;  . RADIOLOGY WITH ANESTHESIA N/A 04/07/2017   Procedure: RADIOLOGY WITH ANESTHESIA;  Surgeon: Lisbeth Renshaw, MD;  Location: [facility] OR;  Service: Radiology;  Laterality: N/A;  . REPLACEMENT TOTAL KNEE BILATERAL  2004  . TEMPOROMANDIBULAR JOINT SURGERY     2 surgeries  . TUMOR REMOVAL    . VAGOTOMY  01/23/2012   Procedure: VAGOTOMY, antrectomy and BII;  Surgeon: Currie Paris, MD;  Location: [facility] OR;  Service: General;  Laterality: N/A;  Laparotomy with vagotomy.    There were no vitals filed for this visit.   Subjective Assessment - 09/09/18 1458    Subjective  Pt reports trimalleolar fracture on 7/16 when she fell at home due  to tripping over her feet and RW. Reports immediate swelling but no pain 2/2 impaired sensation to her R side. Pt reports everything she has learned in her previous PT visits is gone and she can hardly walk with her RW 2/2 to being R NWB for 6 weeks. Pt reports using her w/c for household navigation and only uses her RW when her husband is around 2/2 to balance deficits. Reports walking on the treadmill for 10 minutes last night holding on.      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  Yes   feels tightness and pressure in R ankle   Pain Score  5     Pain Location  Ankle    Pain Orientation  Right    Pain Descriptors / Indicators  Tightness;Discomfort;Pressure    Pain Type  Surgical pain;Other (Comment)   Pain 2/2 to R ORIF surgery   Pain Onset  More than a month ago    Pain Frequency  Constant    Aggravating Factors   R ankle pain worsens with weightbearing     Pain Relieving Factors  sitting     Effect of Pain on Daily Activities  limits daily activity     Multiple Pain Sites  No         [facility] PT Assessment - 09/09/18 1507      Assessment   Medical Diagnosis  ORIF R ankle s/p Trimalleolar fracture & Gait     Referring Provider (PT)  Margarita Rana, MD    Onset Date/Surgical Date  09/04/18   7/16 onset of injury    Prior Therapy  inpatient rehab, HHPT/OT after D/C from hospital, OPPT s/p L cerebellar infarct       Precautions   Precautions  Fall      Restrictions   Weight Bearing Restrictions  No      Balance Screen   Has the patient fallen in the past 6 months  Yes    How many times?  1    Has the patient had a decrease in activity level because of a fear of falling?   Yes    Is the patient reluctant to leave their home because of a fear of falling?   No      Home Public house manager residence    Living Arrangements  Spouse/significant other    Available Help at Discharge  Family    Type of Home  House    Home Access  Stairs to enter    Entrance Stairs-Number of Steps  4    Entrance Stairs-Rails  Right;Left    Home Layout  One level    Home Equipment  Walker - 2 wheels;Wheelchair - manual      Prior Function   Level of Independence  Independent with community mobility with device   Prior to stroke in  2018 Pt was independent      Cognition   Overall Cognitive Status  Impaired/Different from baseline    Area of Impairment  Safety/judgement;Awareness;Problem solving    Safety/Judgement  Decreased awareness of safety;Decreased awareness of deficits      Sensation   Light Touch  Impaired by gross assessment    Light Touch Impaired Details  Absent RLE    Additional Comments  Absent sensation to R proximal thigh       Coordination   Gross Motor Movements are Fluid and Coordinated  No    Heel Shin Test  WFL L LE = R LE      Posture/Postural Control   Posture/Postural Control  --      ROM / Strength   AROM / PROM / Strength  AROM;Strength      AROM   Overall AROM   Within functional limits for tasks performed      Strength   Overall Strength  Deficits    Overall Strength Comments  Pt demonstrates overall 4/5 during MMT when assessed grossly in sitting with exception to R hip flexor being 3/5      Transfers   Transfers  Sit to Stand;Stand to Sit    Sit to Stand  5: Supervision;4: Min guard    Sit to Stand Details (indicate cue type and reason)  Pt requires multiple attempts to achieve stand safely using UE's for assist and relying on LE thighs on back of chair for increased support    Stand to Sit  5: Supervision      Ambulation/Gait   Ambulation/Gait  Yes    Ambulation/Gait Assistance  4: Min guard;4: Min assist   x1 posterior LOB requiring min A to steady    Ambulation Distance (Feet)  40 Feet    Assistive device  Rolling walker    Gait Pattern  Step-through pattern;Decreased stance time - left;Decreased stride length;Ataxic;Lateral hip instability;Decreased trunk rotation    Ambulation Surface  Level;Indoor    Gait velocity  1.96 ft/sec with RW; Indicative limited Education officer, environmental provided CGA for safety consideration     Berg Balance Test   Sit to Stand  Able to stand  independently using hands    Standing Unsupported  Able to stand 2 minutes with  supervision    Sitting with Back Unsupported but Feet Supported on Floor or Stool  Able to sit 2 minutes under supervision    Stand to Sit  Uses backs of legs against chair to control descent    Transfers  Able to transfer with verbal cueing and /or supervision    Standing Unsupported with Eyes Closed  Able to stand 10 seconds with supervision    Standing Ubsupported with Feet Together  Needs help to attain position but able to stand for 30 seconds with feet together    From Standing, Reach Forward with Outstretched Arm  Can reach confidently >25 cm (10")    From Standing Position, Pick up Object from Floor  Unable to try/needs assist to keep balance    From Standing Position, Turn to Look Behind Over each Shoulder  Looks behind one side only/other side shows less weight shift    Turn 360 Degrees  Needs assistance while turning    Standing Unsupported, Alternately Place Feet on Step/Stool  Needs assistance to keep from falling or unable to try    Standing Unsupported, One Foot in Front  Needs help to step but can hold 15 seconds    Standing on One Leg  Unable to try or needs assist to prevent fall    Total Score  25    Berg comment:  25/56 indicative of high risk for falls      Objective measurements completed on examination: See above findings.  PT Education - 09/09/18 1556    Education provided  Yes    Education Details  Therapist provided education regarding initial examination clinical findings, performing forward stepping at home with counter top support and POC moving forward.    Person(s) Educated  Patient;Spouse   Husband [name]   Methods  Explanation    Comprehension  Verbalized understanding       PT Short Term Goals - 09/09/18 1557      PT SHORT TERM GOAL #1   Title  Pt will report compliancy with performing initial HEP to further improve independence with functional mobility     Time  4    Period  Weeks    Status  New    Target Date  10/09/18      PT SHORT TERM  GOAL #2   Title  Pt will improve gait speed to >/= 2.56 ft/esc indicative of community ambulation with LRAD     Baseline  10/1: 1.96 ft/sec with RW and CGA for safety considerations     Time  4    Period  Weeks    Status  New    Target Date  10/09/18      PT SHORT TERM GOAL #3   Title  Pt will improve Berg score to >/= 30/56 indicating decreased fall risk potential     Baseline  10/1: 25/56 indicative of high risk for falls     Time  4    Period  Weeks    Status  New    Target Date  10/09/18      PT SHORT TERM GOAL #4   Title  Pt will increased TUG score by >/= 3 seconds indicating decreased risk for falls and improvement in functional mobility using LRAD    Time  4    Period  Weeks    Status  New    Target Date  10/09/18      PT SHORT TERM GOAL #5   Title  Pt will ambulate 230 feet on level surfaces with LRAD with therapist providing CGA to improve functional mobility     Baseline  10/1: uses RW for ambulation with husband providing supervision    Time  4    Period  Weeks    Status  New    Target Date  10/09/18      Additional Short Term Goals   Additional Short Term Goals  Yes      PT SHORT TERM GOAL #6   Title  Patient will perform 12 steps with alternating stepping pattern using B HR's to improve community accessibility with therapist providing min A     Time  4    Period  Weeks    Status  New    Target Date  10/09/18        PT Long Term Goals - 09/09/18 1603      PT LONG TERM GOAL #1   Title  Pt will report compliancy and independence with HEP to further improve functional mobility     Time  8    Period  Weeks    Status  New    Target Date  11/08/18      PT LONG TERM GOAL #2   Title  Pt will improve Berg score to >/= 36/56 to decrease fall risk potential     Time  12    Period  Weeks    Status  New    Target Date  11/08/18  PT LONG TERM GOAL #3   Title  Pt will improve gait speed to >/=3.16 ft/sec indicating significant change in functional  mobility with LRAD     Time  8    Period  Weeks    Status  New    Target Date  11/08/18      PT LONG TERM GOAL #4   Title  Pt will improve TUG score to </= 13.5 seconds using LRAD indicating decreased fall risk potential     Time  8    Period  Weeks    Status  New    Target Date  11/08/18      PT LONG TERM GOAL #5   Title  Pt will negotiate 12 steps using B HR's and alternating stepping technique with supervision to improve community accessibility     Time  8    Period  Weeks    Status  New    Target Date  11/08/18      PT LONG TERM GOAL #6   Title  Pt will report ability to perform household ambulation using her RW with modified independence demonstrating significant improvement in functional mobility     Baseline  Currently reports using her w/c at home 2/2 to fear of falling with RW    Time  8    Period  Weeks    Status  New    Target Date  11/08/18      PT LONG TERM GOAL #7   Title  Pt will ambulate outdoors over paved surfaces and perform curbs and inclines with supervision using LRAD for 300 feet to improve functional mobility with community distances     Time  8    Period  Weeks    Status  New    Target Date  11/08/18      PT LONG TERM GOAL #8   Title  Pt will ambulate over level surfaces with her RW for 230 feet with supervision using LRAD demonstrating improvement in functional mobility     Time  8    Period  Weeks    Status  New    Target Date  11/08/18             Plan - 09/09/18 1612    Clinical Impression Statement  Pt is a 42 year old female presenting to OPPT neuro for PT evaluation s/p R ORIF 2/2 to trimalleolar fracture and gait training. 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 The following deficits were noted during pt's exam: absent sensation to light touch to R proximal thigh, decreased strength, impaired coordination, motor planning, impaired balance, and gait  abnormalities. Pt's BERG score of 25/56 indicates pt is at high risk for falls. Pt's gait speed of 1.96 ft/sec with RW places patient in the category of limited community ambulator. Pt's condition is evolving and would benefit from skilled PT to address these impairments and functional limitations to maximize functional mobility independence and reduce falls risk.    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 Chronic stroke with patient reporting she has lost all functional gains from previous OPPT. Pt relying on w/c for household navigation 2/2 to fear of falling due to gait abnormalities. Increased reliance on husband for assistance and supervision for functional mobility using RW.      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 Chronic stroke with patient reporting she has lost all functional gains from previous OPPT. Pt relying on w/c for household navigation 2/2 to fear of falling due to gait abnormalities. Increased reliance on husband for assistance and supervision for functional mobility using RW.      Clinical Decision Making  Moderate    Rehab Potential  Good    Clinical Impairments Affecting Rehab Potential  high risk for falls due to abnormal gait, ataxia, sensory changes, and impaired cognition.     PT Frequency  2x / week    PT Duration  8 weeks    PT Treatment/Interventions  ADLs/Self Care Home Management;Aquatic Therapy;DME Instruction;Gait training;Stair training;Functional mobility training;Therapeutic activities;Therapeutic exercise;Balance training;Neuromuscular re-education;Patient/family education;Cognitive remediation;Orthotic Fit/Training;Electrical Stimulation;Cryotherapy;Moist Heat;Manual techniques;Passive range of motion;Taping;Energy conservation    PT Next Visit Plan  Have patient  participate in TUG, NMR, gait training level surfaces with RW, STS with proper technique, establish HEP, balance static progressing to dynamic, strengthening     PT Home Exercise Plan  tbd    Consulted and Agree with Plan of Care  Patient;Family member/caregiver    Family Member Consulted  spouse       Patient will benefit from skilled therapeutic intervention in order to improve the following deficits and impairments:  Abnormal gait, Decreased balance, Decreased cognition, Decreased coordination, Decreased strength, Difficulty walking, Impaired sensation, Pain, Decreased activity tolerance, Decreased mobility, Decreased endurance, Postural dysfunction, Decreased knowledge of use of DME, Impaired perceived functional ability  Visit Diagnosis: Unsteadiness on feet  Other abnormalities of gait and mobility  Other lack of coordination  Other disturbances of skin sensation     Problem List Patient Active Problem List   Diagnosis Date Noted  . 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 Weakness with dizziness-  since [facility] and CVA 04/07/17 08/07/2017  . Disturbances of vision, late effect of stroke 08/06/2017  . Health education/counseling 08/05/2017  . High risk medications (not anticoagulants) long-term use 08/05/2017  . Neuritis-  R sided:  arm and leg/ body due to stroke 08/05/2017  . Elevated LDL cholesterol level 07/11/2017  . [employer] (NIH) Stroke Scale limb ataxia score 2, ataxia present in two limbs 06/25/2017  . 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 Elevated vitamin B12 level 05/30/2017  . Vitamin D deficiency 05/29/2017  . History of tobacco abuse-  30pk yr hx - quit 04/07/17 05/21/2017  . Gait disturbance, post-stroke 05/14/2017  . Benign essential HTN   . Vitreous hemorrhage of right eye ([name])   . Adjustment disorder with mixed anxiety and depressed mood   . 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 s/p SAH (subarachnoid hemorrhage) ([name]) 04/19/2017  . Basilar artery aneurysm ([name])   . Hypoxia   . 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 CVA (cerebral vascular accident) ([name]) 04/07/2017  . Elevated gastrin level 01/25/2012  . Hypokalemia 01/21/2012  . Nausea & vomiting 01/20/2012  . Epigastric pain 01/20/2012  . Duodenal ulcer, acute with obstruction 10/17/2011  . S/P laparoscopic cholecystectomy 10/16/2011    Carney Living, SPT 09/09/2018, 4:23 PM  [facility] [address] [place], [place], [address] Phone: [phone]   Fax:  [phone]  Name: RIVERLYN KIZZIAH MRN: [id number] Date of Birth: November 29, 1976

## 2018-09-09 NOTE — Patient Instructions (Signed)
Weight Shift: Stepping Forward and Back    Weight on left leg, step backward and transfer weight onto right leg, lifting toes of left foot. Then step forward and transfer weight onto right leg, lifting heel of left foot. Repeat __10_ times each way. Then stand on right leg and move left leg. Hold onto counter top for support.   Copyright  VHI. All rights reserved.

## 2018-09-16 ENCOUNTER — Ambulatory Visit: Payer: BLUE CROSS/BLUE SHIELD | Admitting: Physical Therapy

## 2018-09-16 ENCOUNTER — Encounter: Payer: Self-pay | Admitting: Physical Therapy

## 2018-09-16 DIAGNOSIS — R2681 Unsteadiness on feet: Secondary | ICD-10-CM | POA: Diagnosis not present

## 2018-09-16 DIAGNOSIS — R278 Other lack of coordination: Secondary | ICD-10-CM

## 2018-09-16 DIAGNOSIS — R2689 Other abnormalities of gait and mobility: Secondary | ICD-10-CM

## 2018-09-16 DIAGNOSIS — R208 Other disturbances of skin sensation: Secondary | ICD-10-CM

## 2018-09-16 NOTE — Patient Instructions (Signed)
Access Code: [email or web address]  URL: [email or web address]  Date: 09/16/2018  Prepared by: [name]   Exercises  Seated Gastroc Stretch with Strap - 3 reps - 1 sets - 30 hold - 1x daily - 7x weekly  Supine Hip Flexor Stretch with Weight - 3 reps - 1 sets - 60 hold - 1x daily - 7x weekly  Supine Bridge - 10 reps - 1 sets - 5 hold - 1x daily - 7x weekly  Sit to Stand - 5-10 reps - 1 sets - 1x daily - 7x weekly

## 2018-09-18 NOTE — Therapy (Signed)
[facility] [address] [place], [place], [address] Phone: [phone]   Fax:  [phone]  Physical Therapy Treatment  Patient Details  Name: [name] Barnett MRN: [id number] Date of Birth: 11/28/1976 Referring Provider (PT): Margarita Rana, MD   Encounter Date: 09/16/2018   09/16/18 1408  PT Visits / Re-Eval  Visit Number 2  Number of Visits 17  Date for PT Re-Evaluation 11/08/18  Authorization  Authorization Type BC[employer]  PT Time Calculation  PT Start Time 1403  PT Stop Time 1445  PT Time Calculation (min) 42 min  PT - End of Session  Equipment Utilized During Treatment Gait belt  Activity Tolerance Patient tolerated treatment well  Behavior During Therapy [facility] for tasks assessed/performed     Past Medical History:  Diagnosis Date  . Duodenal obstruction   . GERD (gastroesophageal reflux disease)   . Hypertension   . Stroke [facility])    april 2018, lt sided weakness  . Trimalleolar fracture of ankle, closed, right, initial encounter     Past Surgical History:  Procedure Laterality Date  . BALLOON DILATION  12/31/2011   Procedure: BALLOON DILATION;  Surgeon: Barrie Folk, MD;  Location: [facility] ENDOSCOPY;  Service: Endoscopy;  Laterality: N/A;  . CHOLECYSTECTOMY  07/28/11  . ESOPHAGOGASTRODUODENOSCOPY  10/17/2011   Procedure: ESOPHAGOGASTRODUODENOSCOPY (EGD);  Surgeon: Barrie Folk, MD;  Location: [facility] ENDOSCOPY;  Service: Endoscopy;  Laterality: N/A;  . IR ANGIO INTRA EXTRACRAN SEL INTERNAL CAROTID BILAT MOD SED  04/07/2017  . IR ANGIO INTRA EXTRACRAN SEL INTERNAL CAROTID BILAT MOD SED  01/07/2018  . IR ANGIO VERTEBRAL SEL VERTEBRAL UNI R MOD SED  04/07/2017  . IR ANGIO VERTEBRAL SEL VERTEBRAL UNI R MOD SED  01/07/2018  . IR ANGIOGRAM FOLLOW UP STUDY  04/07/2017  . IR ANGIOGRAM FOLLOW UP STUDY  04/07/2017  . IR ANGIOGRAM FOLLOW UP STUDY  04/07/2017  . IR ANGIOGRAM FOLLOW UP STUDY  04/07/2017  . IR ANGIOGRAM FOLLOW UP STUDY   04/07/2017  . IR ANGIOGRAM SELECTIVE EACH ADDITIONAL VESSEL  04/07/2017  . IR TRANSCATH/EMBOLIZ  04/07/2017  . IR US GUIDE VASC ACCESS RIGHT  01/07/2018  . ORIF ANKLE FRACTURE Right 07/01/2018   Procedure: RIGHT ANKLE FRACTURE OPEN TREATMENT TRIMALLEOLAR INCLUDES INTERNAL FIXATION WITHOUT FIXATION OF POSTERIOR LIP, FRACTURE CLOSED TREATMENT TRIMALLEOLAR ANKLE WITH MANIPULATION, REPAIR SYNDESMOSIS, REPAIR DELTOID LIGAMENT;  Surgeon: Sheral Apley, MD;  Location: [facility];  Service: Orthopedics;  Laterality: Right;  . PARS PLANA VITRECTOMY Right 05/01/2017   Procedure: PARS PLANA VITRECTOMY WITH 25 GAUGE RIGHT EYE, endolaser photocoaglation;  Surgeon: Carmela Rima, MD;  Location: [facility] OR;  Service: Ophthalmology;  Laterality: Right;  . RADIOLOGY WITH ANESTHESIA N/A 04/07/2017   Procedure: RADIOLOGY WITH ANESTHESIA;  Surgeon: Lisbeth Renshaw, MD;  Location: [facility] OR;  Service: Radiology;  Laterality: N/A;  . REPLACEMENT TOTAL KNEE BILATERAL  2004  . TEMPOROMANDIBULAR JOINT SURGERY     2 surgeries  . TUMOR REMOVAL    . VAGOTOMY  01/23/2012   Procedure: VAGOTOMY, antrectomy and BII;  Surgeon: Currie Paris, MD;  Location: [facility] OR;  Service: General;  Laterality: N/A;  Laparotomy with vagotomy.    There were no vitals filed for this visit.     09/16/18 1405  Symptoms/Limitations  Subjective Continues to report right hip pain and ankle tightness. No falls.   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 Yes  Pain Score 9  Pain Location Hip  Pain Orientation Right  Pain Descriptors / Indicators Sharp;Stabbing  Pain Type Chronic pain  Pain Onset More than a month ago  Pain Frequency  Constant  Aggravating Factors  increased activity  Pain Relieving Factors resting sometimes  Multiple Pain Sites Yes  2nd Pain Site  Pain Score 5  Pain Location Ankle  Pain Orientation Right  Pain Descriptors / Indicators Sore;Tightness  Pain Type Surgical pain;Acute pain  Pain Onset More than a month ago  Pain Frequency Constant  Aggravating Factors  recent surgery, weight bearing  Pain Relieving Factors rest, ice    Treatment: focused on establishment of base line for Timed up and go, then set up of an initial HEP.      09/16/18 1412  Standardized Balance Assessment  Standardized Balance Assessment TUG  Timed Up and Go Test  TUG Normal TUG  Normal TUG (seconds) 32 (with RW, min guard to min assist for balance)    Issued the following to HEP with cues on correct ex form/technique.    Access Code: [email or web address]  URL: [email or web address]  Date: 09/16/2018  Prepared by: [name]   Exercises  Seated Gastroc Stretch with Strap - 3 reps - 1 sets - 30 hold - 1x daily - 7x weekly  Supine Hip Flexor Stretch with Weight - 3 reps - 1 sets - 60 hold - 1x daily - 7x weekly  Supine Bridge - 10 reps - 1 sets - 5 hold - 1x daily - 7x weekly  Sit to Stand - 5-10 reps - 1 sets - 1x daily - 7x weekly         09/16/18 1441  PT Education  Education provided Yes  Education Details results of Timed UP and go test; intial HEP  Person(s) Educated Patient;Spouse  Methods Explanation;Demonstration;Verbal cues;Handout  Comprehension Verbalized understanding;Returned demonstration;Verbal cues required;Tactile cues required;Need further instruction     PT Short Term Goals - 09/09/18 1557      PT SHORT TERM GOAL #1   Title  Pt will report compliancy with performing initial HEP to further improve independence with functional mobility     Time  4    Period  Weeks    Status  New    Target Date  10/09/18      PT SHORT TERM GOAL #2   Title  Pt will improve gait speed to >/=  2.56 ft/esc indicative of community ambulation with LRAD     Baseline  10/1: 1.96 ft/sec with RW and CGA for safety considerations     Time  4    Period  Weeks    Status  New    Target Date  10/09/18      PT SHORT TERM GOAL #3   Title  Pt will improve Berg score to >/= 30/56 indicating decreased fall risk potential     Baseline  10/1: 25/56 indicative of high risk for falls     Time  4    Period  Weeks    Status  New    Target Date  10/09/18      PT SHORT TERM GOAL #4   Title  Pt will increased TUG score by >/= 3 seconds indicating decreased risk for falls and improvement in functional mobility using LRAD  Time  4    Period  Weeks    Status  New    Target Date  10/09/18      PT SHORT TERM GOAL #5   Title  Pt will ambulate 230 feet on level surfaces with LRAD with therapist providing CGA to improve functional mobility     Baseline  10/1: uses RW for ambulation with husband providing supervision    Time  4    Period  Weeks    Status  New    Target Date  10/09/18      Additional Short Term Goals   Additional Short Term Goals  Yes      PT SHORT TERM GOAL #6   Title  Patient will perform 12 steps with alternating stepping pattern using B HR's to improve community accessibility with therapist providing min A     Time  4    Period  Weeks    Status  New    Target Date  10/09/18        PT Long Term Goals - 09/09/18 1603      PT LONG TERM GOAL #1   Title  Pt will report compliancy and independence with HEP to further improve functional mobility     Time  8    Period  Weeks    Status  New    Target Date  11/08/18      PT LONG TERM GOAL #2   Title  Pt will improve Berg score to >/= 36/56 to decrease fall risk potential     Time  12    Period  Weeks    Status  New    Target Date  11/08/18      PT LONG TERM GOAL #3   Title  Pt will improve gait speed to >/=3.16 ft/sec indicating significant change in functional mobility with LRAD     Time  8    Period  Weeks     Status  New    Target Date  11/08/18      PT LONG TERM GOAL #4   Title  Pt will improve TUG score to </= 13.5 seconds using LRAD indicating decreased fall risk potential     Time  8    Period  Weeks    Status  New    Target Date  11/08/18      PT LONG TERM GOAL #5   Title  Pt will negotiate 12 steps using B HR's and alternating stepping technique with supervision to improve community accessibility     Time  8    Period  Weeks    Status  New    Target Date  11/08/18      PT LONG TERM GOAL #6   Title  Pt will report ability to perform household ambulation using her RW with modified independence demonstrating significant improvement in functional mobility     Baseline  Currently reports using her w/c at home 2/2 to fear of falling with RW    Time  8    Period  Weeks    Status  New    Target Date  11/08/18      PT LONG TERM GOAL #7   Title  Pt will ambulate outdoors over paved surfaces and perform curbs and inclines with supervision using LRAD for 300 feet to improve functional mobility with community distances     Time  8    Period  Weeks    Status  New    Target Date  11/08/18      PT LONG TERM GOAL #8   Title  Pt will ambulate over level surfaces with her RW for 230 feet with supervision using LRAD demonstrating improvement in functional mobility     Time  8    Period  Weeks    Status  New    Target Date  11/08/18          09/16/18 1408  Plan  Clinical Impression Statement Today's skilled session focused on establishing base line value for the Timed up and go with pt falling into a fall risk category. Remainder of session focused on setting up an HEP. Pt reports not doing any thing at this time except she has returned to her treadmill walking program given to her last episode of care with spouse assitance. She needed cues to slow down and for technique/form with ex's given today. The pt is progressing toward goals and should benefit from continued PT to progress toward  unmet goals.  Pt will benefit from skilled therapeutic intervention in order to improve on the following deficits Abnormal gait;Decreased balance;Decreased cognition;Decreased coordination;Decreased strength;Difficulty walking;Impaired sensation;Pain;Decreased activity tolerance;Decreased mobility;Decreased endurance;Postural dysfunction;Decreased knowledge of use of DME;Impaired perceived functional ability  Rehab Potential Good  Clinical Impairments Affecting Rehab Potential high risk for falls due to abnormal gait, ataxia, sensory changes, and impaired cognition.   PT Frequency 2x / week  PT Duration 8 weeks  PT Treatment/Interventions ADLs/Self Care Home Management;Aquatic Therapy;DME Instruction;Gait training;Stair training;Functional mobility training;Therapeutic activities;Therapeutic exercise;Balance training;Neuromuscular re-education;Patient/family education;Cognitive remediation;Orthotic Fit/Training;Electrical Stimulation;Cryotherapy;Moist Heat;Manual techniques;Passive range of motion;Taping;Energy conservation  PT Next Visit Plan NMR, gait training level surfaces with RW, STS with proper technique, balance static progressing to dynamic, strengthening   Consulted and Agree with Plan of Care Patient;Family member/caregiver  Family Member Consulted spouse         Patient will benefit from skilled therapeutic intervention in order to improve the following deficits and impairments:  Abnormal gait, Decreased balance, Decreased cognition, Decreased coordination, Decreased strength, Difficulty walking, Impaired sensation, Pain, Decreased activity tolerance, Decreased mobility, Decreased endurance, Postural dysfunction, Decreased knowledge of use of DME, Impaired perceived functional ability  Visit Diagnosis: Unsteadiness on feet  Other abnormalities of gait and mobility  Other disturbances of skin sensation  Other lack of coordination     Problem List Patient Active Problem List    Diagnosis Date Noted  . 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 Weakness with dizziness-  since [facility] and CVA 04/07/17 08/07/2017  . Disturbances of vision, late effect of stroke 08/06/2017  . Health education/counseling 08/05/2017  . High risk medications (not anticoagulants) long-term use 08/05/2017  . Neuritis-  R sided:  arm and leg/ body due to stroke 08/05/2017  . Elevated LDL cholesterol level 07/11/2017  . [employer] (NIH) Stroke Scale limb ataxia score 2, ataxia present in two limbs 06/25/2017  . 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 Elevated vitamin B12 level 05/30/2017  . Vitamin D deficiency 05/29/2017  . History of tobacco abuse-  30pk yr hx - quit 04/07/17 05/21/2017  . Gait disturbance, post-stroke 05/14/2017  . Benign essential HTN   . Vitreous hemorrhage of right eye ([name])   . Adjustment disorder with mixed anxiety and depressed mood   . 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 s/p SAH (subarachnoid hemorrhage) ([name]) 04/19/2017  . Basilar artery aneurysm ([name])   .  Hypoxia   . 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 CVA (cerebral vascular accident) ([name]) 04/07/2017  . Elevated gastrin level 01/25/2012  . Hypokalemia 01/21/2012  . Nausea & vomiting 01/20/2012  . Epigastric pain 01/20/2012  . Duodenal ulcer, acute with obstruction 10/17/2011  . S/P laparoscopic cholecystectomy 10/16/2011    [name], PTA, [facility] [address] [place], [place] [address] [phone] 09/18/18, 9:01 AM   Name: [name] Barnett MRN: [id number] Date of Birth: 1976-03-28

## 2018-09-23 ENCOUNTER — Encounter: Payer: Self-pay | Admitting: Physical Therapy

## 2018-09-23 ENCOUNTER — Ambulatory Visit: Payer: BLUE CROSS/BLUE SHIELD | Admitting: Physical Therapy

## 2018-09-23 DIAGNOSIS — R2689 Other abnormalities of gait and mobility: Secondary | ICD-10-CM

## 2018-09-23 DIAGNOSIS — R2681 Unsteadiness on feet: Secondary | ICD-10-CM | POA: Diagnosis not present

## 2018-09-23 DIAGNOSIS — R278 Other lack of coordination: Secondary | ICD-10-CM

## 2018-09-23 DIAGNOSIS — R208 Other disturbances of skin sensation: Secondary | ICD-10-CM

## 2018-09-24 NOTE — Patient Instructions (Signed)
Access Code: [email or web address]  URL: [email or web address]  Date: 09/23/2018  Prepared by: [name]   Exercises  Supine Hip Flexor Stretch with Weight - 3 reps - 1 sets - 60 hold - 1x daily - 7x weekly  Supine Bridge - 10 reps - 1 sets - 5 hold - 1x daily - 7x weekly  Seated Hamstring Stretch with Chair - 3 reps - 1 sets - 30 hold - 1x daily - 7x weekly  Sit to Stand - 5-10 reps - 1 sets - 1x daily - 7x weekly  Ankle Dorsiflexion with Resistance - 10 reps - 1 sets - 1x daily - 7x weekly  Seated Ankle Plantar Flexion with Resistance Loop - 10 reps - 1 sets - 1x daily - 7x weekly  Romberg Stance with Eyes Closed - 3 reps - 1 sets - 30 hold - 1x daily - 7x weekly  Standing with Head Rotation - 10 reps - 1 sets - 1x daily - 7x weekly

## 2018-09-24 NOTE — Therapy (Signed)
[facility] [address] [place], [place], [address] Phone: [phone]   Fax:  [phone]  Physical Therapy Treatment  Patient Details  Name: [name] Barnett MRN: [id number] Date of Birth: [birth year] Referring Provider (PT): Margarita Rana, MD   Encounter Date: 09/23/2018  PT End of Session - 09/23/18 1543    Visit Number  3    Number of Visits  17    Date for PT Re-Evaluation  11/08/18    Authorization Type  [employer]    PT Start Time  1536   pt running late due to car issues   PT Stop Time  1615    PT Time Calculation (min)  39 min    Equipment Utilized During Treatment  Gait belt    Activity Tolerance  Patient tolerated treatment well    Behavior During Therapy  [facility] for tasks assessed/performed       Past Medical History:  Diagnosis Date  . Duodenal obstruction   . GERD (gastroesophageal reflux disease)   . Hypertension   . Stroke [facility])    april 2018, lt sided weakness  . Trimalleolar fracture of ankle, closed, right, initial encounter     Past Surgical History:  Procedure Laterality Date  . BALLOON DILATION  12/31/2011   Procedure: BALLOON DILATION;  Surgeon: Barrie Folk, MD;  Location: [facility] ENDOSCOPY;  Service: Endoscopy;  Laterality: N/A;  . CHOLECYSTECTOMY  07/28/11  . ESOPHAGOGASTRODUODENOSCOPY  10/17/2011   Procedure: ESOPHAGOGASTRODUODENOSCOPY (EGD);  Surgeon: Barrie Folk, MD;  Location: [facility] ENDOSCOPY;  Service: Endoscopy;  Laterality: N/A;  . IR ANGIO INTRA EXTRACRAN SEL INTERNAL CAROTID BILAT MOD SED  04/07/2017  . IR ANGIO INTRA EXTRACRAN SEL INTERNAL CAROTID BILAT MOD SED  01/07/2018  . IR ANGIO VERTEBRAL SEL VERTEBRAL UNI R MOD SED  04/07/2017  . IR ANGIO VERTEBRAL SEL VERTEBRAL UNI R MOD SED  01/07/2018  . IR ANGIOGRAM FOLLOW UP STUDY  04/07/2017  . IR ANGIOGRAM FOLLOW UP STUDY  04/07/2017  . IR ANGIOGRAM FOLLOW UP STUDY  04/07/2017  . IR ANGIOGRAM FOLLOW UP STUDY  04/07/2017  . IR ANGIOGRAM FOLLOW UP  STUDY  04/07/2017  . IR ANGIOGRAM SELECTIVE EACH ADDITIONAL VESSEL  04/07/2017  . IR TRANSCATH/EMBOLIZ  04/07/2017  . IR US GUIDE VASC ACCESS RIGHT  01/07/2018  . ORIF ANKLE FRACTURE Right 07/01/2018   Procedure: RIGHT ANKLE FRACTURE OPEN TREATMENT TRIMALLEOLAR INCLUDES INTERNAL FIXATION WITHOUT FIXATION OF POSTERIOR LIP, FRACTURE CLOSED TREATMENT TRIMALLEOLAR ANKLE WITH MANIPULATION, REPAIR SYNDESMOSIS, REPAIR DELTOID LIGAMENT;  Surgeon: Sheral Apley, MD;  Location: [facility];  Service: Orthopedics;  Laterality: Right;  . PARS PLANA VITRECTOMY Right 05/01/2017   Procedure: PARS PLANA VITRECTOMY WITH 25 GAUGE RIGHT EYE, endolaser photocoaglation;  Surgeon: Carmela Rima, MD;  Location: [facility] OR;  Service: Ophthalmology;  Laterality: Right;  . RADIOLOGY WITH ANESTHESIA N/A 04/07/2017   Procedure: RADIOLOGY WITH ANESTHESIA;  Surgeon: Lisbeth Renshaw, MD;  Location: [facility] OR;  Service: Radiology;  Laterality: N/A;  . REPLACEMENT TOTAL KNEE BILATERAL  2004  . TEMPOROMANDIBULAR JOINT SURGERY     2 surgeries  . TUMOR REMOVAL    . VAGOTOMY  01/23/2012   Procedure: VAGOTOMY, antrectomy and BII;  Surgeon: Currie Paris, MD;  Location: [facility] OR;  Service: General;  Laterality: N/A;  Laparotomy with vagotomy.    There were no vitals filed for this visit.  Subjective Assessment - 09/23/18 1539    Subjective  Continues to report right hip  pain. Spouse questioning where to go for a 2cd opinion as he feels its more that OA. Directed them to start with the pt's primary MD. No falls.     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  Yes    Pain Score  8    at rest, 10/10 with movements   Pain Location  Hip     Pain Orientation  Right    Pain Descriptors / Indicators  Sharp;Stabbing;Radiating    Pain Type  Chronic pain    Pain Radiating Towards  radiating all the way down the leg. reports her leg also goes numb at times.     Pain Onset  More than a month ago    Pain Frequency  Constant    Aggravating Factors   constant, however intensity increased with activity    Pain Relieving Factors  resting until leg goes numb, then has to get up which increases pain.       Treatment: focused on strengthening and adding to HEP, along with gait with RW.    Gait: 100 feet x 2 with RW with min guard assist to min assist at times. Pt with ataxic gait pattern and decreased balance with distractions needing up to min assist. When focused on gait min guard assist needed.  Exercises/NMR: cues on ex form and technique.  Bridging- 5 sec holds x 10 reps. Cues for full lift of hips with each rep.   Added the following to HEP today after performance in session. Cues to slow down for improved technique.  Ankle Dorsiflexion with Resistance - 10 reps - 1 sets - 1x daily - 7x weekly  Seated Ankle Plantar Flexion with Resistance Loop - 10 reps - 1 sets - 1x daily - 7x weekly  Romberg Stance with Eyes Closed - 3 reps - 1 sets - 30 hold - 1x daily - 7x weekly  Standing with Head Rotation - 10 reps - 1 sets - 1x daily - 7x weekly       PT Short Term Goals - 09/09/18 1557      PT SHORT TERM GOAL #1   Title  Pt will report compliancy with performing initial HEP to further improve independence with functional mobility     Time  4    Period  Weeks    Status  New    Target Date  10/09/18      PT SHORT TERM GOAL #2   Title  Pt will improve gait speed to >/= 2.56 ft/esc indicative of community ambulation with LRAD     Baseline  10/1: 1.96 ft/sec with RW and CGA for safety considerations     Time  4    Period  Weeks    Status  New    Target Date  10/09/18      PT SHORT TERM GOAL #3   Title  Pt will improve Berg score  to >/= 30/56 indicating decreased fall risk potential     Baseline  10/1: 25/56 indicative of high risk for falls     Time  4    Period  Weeks    Status  New    Target Date  10/09/18      PT SHORT TERM GOAL #4   Title  Pt will increased TUG score by >/= 3 seconds indicating decreased risk for falls and improvement in functional mobility using LRAD    Time  4    Period  Weeks    Status  New    Target Date  10/09/18      PT SHORT TERM GOAL #5   Title  Pt will ambulate 230 feet on level surfaces with LRAD with therapist providing CGA to improve functional mobility     Baseline  10/1: uses RW for ambulation with husband providing supervision    Time  4    Period  Weeks    Status  New    Target Date  10/09/18      Additional Short Term Goals   Additional Short Term Goals  Yes      PT SHORT TERM GOAL #6   Title  Patient will perform 12 steps with alternating stepping pattern using B HR's to improve community accessibility with therapist providing min A     Time  4    Period  Weeks    Status  New    Target Date  10/09/18        PT Long Term Goals - 09/09/18 1603      PT LONG TERM GOAL #1   Title  Pt will report compliancy and independence with HEP to further improve functional mobility     Time  8    Period  Weeks    Status  New    Target Date  11/08/18      PT LONG TERM GOAL #2   Title  Pt will improve Berg score to >/= 36/56 to decrease fall risk potential     Time  12    Period  Weeks    Status  New    Target Date  11/08/18      PT LONG TERM GOAL #3   Title  Pt will improve gait speed to >/=3.16 ft/sec indicating significant change in functional mobility with LRAD     Time  8    Period  Weeks    Status  New    Target Date  11/08/18      PT LONG TERM GOAL #4   Title  Pt will improve TUG score to </= 13.5 seconds using LRAD indicating decreased fall risk potential     Time  8    Period  Weeks    Status  New    Target Date  11/08/18      PT LONG TERM GOAL #5    Title  Pt will negotiate 12 steps using B HR's and alternating stepping technique with supervision to improve community accessibility     Time  8    Period  Weeks    Status  New    Target Date  11/08/18      PT LONG TERM GOAL #6   Title  Pt will report ability to perform household ambulation using her RW with modified independence demonstrating significant improvement in functional mobility     Baseline  Currently reports using her w/c at home 2/2 to fear of falling with RW    Time  8    Period  Weeks    Status  New    Target Date  11/08/18      PT LONG TERM GOAL #7   Title  Pt will ambulate outdoors over  paved surfaces and perform curbs and inclines with supervision using LRAD for 300 feet to improve functional mobility with community distances     Time  8    Period  Weeks    Status  New    Target Date  11/08/18      PT LONG TERM GOAL #8   Title  Pt will ambulate over level surfaces with her RW for 230 feet with supervision using LRAD demonstrating improvement in functional mobility     Time  8    Period  Weeks    Status  New    Target Date  11/08/18            Plan - 09/23/18 1544    Clinical Impression Statement  Today's skilled session focused on additions to HEP to continue to address strengthening and begin to address balance. Limited by hip pain today. Discussed with primary PT Rosalita Chessman Dild[place]) the limitation due to hip pain. Was decided to have pt concider aqauatic therapy for more gentle strengthening vs go on hold until hip pain can be addressed. Will discuss this at pt's next appt.                     Rehab Potential  Good    Clinical Impairments Affecting Rehab Potential  high risk for falls due to abnormal gait, ataxia, sensory changes, and impaired cognition.     PT Frequency  2x / week    PT Duration  8 weeks    PT Treatment/Interventions  ADLs/Self Care Home Management;Aquatic Therapy;DME Instruction;Gait training;Stair training;Functional mobility  training;Therapeutic activities;Therapeutic exercise;Balance training;Neuromuscular re-education;Patient/family education;Cognitive remediation;Orthotic Fit/Training;Electrical Stimulation;Cryotherapy;Moist Heat;Manual techniques;Passive range of motion;Taping;Energy conservation    PT Next Visit Plan  discuss with pt aquatic therapy vs go on hold to have hip pain addressed     PT Home Exercise Plan  Access Code: [email or web address]     Consulted and Agree with Plan of Care  Patient;Family member/caregiver    Family Member Consulted  spouse       Patient will benefit from skilled therapeutic intervention in order to improve the following deficits and impairments:  Abnormal gait, Decreased balance, Decreased cognition, Decreased coordination, Decreased strength, Difficulty walking, Impaired sensation, Pain, Decreased activity tolerance, Decreased mobility, Decreased endurance, Postural dysfunction, Decreased knowledge of use of DME, Impaired perceived functional ability  Visit Diagnosis: Unsteadiness on feet  Other abnormalities of gait and mobility  Other disturbances of skin sensation  Other lack of coordination     Problem List Patient Active Problem List   Diagnosis Date Noted  . 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 Weakness with dizziness-  since [facility] and CVA 04/07/17 08/07/2017  . Disturbances of vision, late effect of stroke 08/06/2017  . Health education/counseling 08/05/2017  . High risk medications (not anticoagulants) long-term use 08/05/2017  . Neuritis-  R sided:  arm and leg/ body due to stroke 08/05/2017  . Elevated LDL cholesterol level 07/11/2017  . [employer] (NIH) Stroke Scale limb ataxia score 2, ataxia present in two limbs 06/25/2017  . 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 Elevated vitamin B12 level 05/30/2017  . Vitamin D deficiency 05/29/2017  . History of tobacco abuse-  30pk yr hx - quit 04/07/17 05/21/2017  . Gait disturbance,  post-stroke 05/14/2017  . Benign essential HTN   . Vitreous hemorrhage of right eye ([name])   . Adjustment disorder with mixed anxiety and depressed mood   . 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 s/p SAH (subarachnoid hemorrhage) ([name]) 04/19/2017  . Basilar artery aneurysm ([name])   . Hypoxia   . 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 CVA (cerebral vascular accident) ([name]) 04/07/2017  . Elevated gastrin level 01/25/2012  . Hypokalemia 01/21/2012  . Nausea & vomiting 01/20/2012  . Epigastric pain 01/20/2012  . Duodenal ulcer, acute with obstruction 10/17/2011  . S/P laparoscopic cholecystectomy 10/16/2011    [name], PTA, [facility] [address] [place], [place] [address] [phone] 09/24/18, 8:28 AM   Name: [name] Barnett MRN: [id number] Date of Birth: [birth year]

## 2018-09-26 ENCOUNTER — Ambulatory Visit: Payer: BLUE CROSS/BLUE SHIELD | Admitting: Physical Therapy

## 2018-09-26 ENCOUNTER — Encounter: Payer: Self-pay | Admitting: Physical Therapy

## 2018-09-26 DIAGNOSIS — R2689 Other abnormalities of gait and mobility: Secondary | ICD-10-CM

## 2018-09-26 DIAGNOSIS — R208 Other disturbances of skin sensation: Secondary | ICD-10-CM

## 2018-09-26 DIAGNOSIS — R2681 Unsteadiness on feet: Secondary | ICD-10-CM | POA: Diagnosis not present

## 2018-09-28 NOTE — Therapy (Signed)
[facility] [address] [place], [place], [address] Phone: [phone]   Fax:  [phone]  Physical Therapy Treatment  Patient Details  Name: [name] Barnett MRN: [id number] Date of Birth: March 20, 1976 Referring Provider (PT): Edmonia Lynch, MD   Encounter Date: 09/26/2018   09/26/18 1500  PT Visits / Re-Eval  Visit Number 4  Number of Visits 17  Date for PT Re-Evaluation 11/08/18  Authorization  Authorization Type BC[employer]  PT Time Calculation  PT Start Time 1450  PT Stop Time 1530  PT Time Calculation (min) 40 min  PT - End of Session  Equipment Utilized During Treatment Gait belt  Activity Tolerance Patient tolerated treatment well  Behavior During Therapy [facility] for tasks assessed/performed     Past Medical History:  Diagnosis Date  . Duodenal obstruction   . GERD (gastroesophageal reflux disease)   . Hypertension   . Stroke [facility])    april 2018, lt sided weakness  . Trimalleolar fracture of ankle, closed, right, initial encounter     Past Surgical History:  Procedure Laterality Date  . BALLOON DILATION  12/31/2011   Procedure: BALLOON DILATION;  Surgeon: Missy Sabins, MD;  Location: [facility] ENDOSCOPY;  Service: Endoscopy;  Laterality: N/A;  . CHOLECYSTECTOMY  07/28/11  . ESOPHAGOGASTRODUODENOSCOPY  10/17/2011   Procedure: ESOPHAGOGASTRODUODENOSCOPY (EGD);  Surgeon: Missy Sabins, MD;  Location: [facility] ENDOSCOPY;  Service: Endoscopy;  Laterality: N/A;  . IR ANGIO INTRA EXTRACRAN SEL INTERNAL CAROTID BILAT MOD SED  04/07/2017  . IR ANGIO INTRA EXTRACRAN SEL INTERNAL CAROTID BILAT MOD SED  01/07/2018  . IR ANGIO VERTEBRAL SEL VERTEBRAL UNI R MOD SED  04/07/2017  . IR ANGIO VERTEBRAL SEL VERTEBRAL UNI R MOD SED  01/07/2018  . IR ANGIOGRAM FOLLOW UP STUDY  04/07/2017  . IR ANGIOGRAM FOLLOW UP STUDY  04/07/2017  . IR ANGIOGRAM FOLLOW UP STUDY  04/07/2017  . IR ANGIOGRAM FOLLOW UP STUDY  04/07/2017  . IR ANGIOGRAM FOLLOW UP STUDY   04/07/2017  . IR ANGIOGRAM SELECTIVE EACH ADDITIONAL VESSEL  04/07/2017  . IR TRANSCATH/EMBOLIZ  04/07/2017  . IR US GUIDE VASC ACCESS RIGHT  01/07/2018  . ORIF ANKLE FRACTURE Right 07/01/2018   Procedure: RIGHT ANKLE FRACTURE OPEN TREATMENT TRIMALLEOLAR INCLUDES INTERNAL FIXATION WITHOUT FIXATION OF POSTERIOR LIP, FRACTURE CLOSED TREATMENT TRIMALLEOLAR ANKLE WITH MANIPULATION, REPAIR SYNDESMOSIS, REPAIR DELTOID LIGAMENT;  Surgeon: Renette Butters, MD;  Location: [facility];  Service: Orthopedics;  Laterality: Right;  . PARS PLANA VITRECTOMY Right 05/01/2017   Procedure: PARS PLANA VITRECTOMY WITH 25 GAUGE RIGHT EYE, endolaser photocoaglation;  Surgeon: Jalene Mullet, MD;  Location: [facility];  Service: Ophthalmology;  Laterality: Right;  . RADIOLOGY WITH ANESTHESIA N/A 04/07/2017   Procedure: RADIOLOGY WITH ANESTHESIA;  Surgeon: Consuella Lose, MD;  Location: [facility];  Service: Radiology;  Laterality: N/A;  . REPLACEMENT TOTAL KNEE BILATERAL  2004  . TEMPOROMANDIBULAR JOINT SURGERY     2 surgeries  . TUMOR REMOVAL    . VAGOTOMY  01/23/2012   Procedure: VAGOTOMY, antrectomy and BII;  Surgeon: Haywood Lasso, MD;  Location: [facility];  Service: General;  Laterality: N/A;  Laparotomy with vagotomy.    There were no vitals filed for this visit.   09/26/18 1452  Symptoms/Limitations  Subjective Hip pain has improved with the hip flexor stretching. Pt wishes to try aquatic therapy over going on hold as she has met her deductables/OOP's and wants to maximize on this before January.  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 Yes  Pain Score 7  Pain Location Hip  Pain Orientation Right  Pain  Descriptors / Indicators Sharp;Stabbing;Radiating  Pain Type Chronic pain  Pain Onset More than a month ago  Pain Frequency Constant  Aggravating Factors  constant, intensity varies  Pain Relieving Factors resting until leg goes numb, then she has to get up which increases pain     09/26/18 1501  Transfers  Transfers Sit to Stand;Stand to Sit  Sit to Stand 5: Supervision;4: Min guard;With upper extremity assist;Without upper extremity assist;From bed  Stand to Sit 5: Supervision;4: Min guard;With upper extremity assist;Without upper extremity assist;To bed  Number of Reps Other reps (comment);Other sets (comment)  Comments worked en bloc on sit<>stand transfers with emphasis on increased anterior weight shifting, equal LE weight bearing and to power up through both legs in attempt to decrease retropulsion with standing. pt with increase heel cord pain on right LE with this, decreased after passive stretching/manual therapy to right ankle. pt with improved technique after several reps, even reported "feeling it" in her quads on last 5-6 reps.                             Ambulation/Gait  Ambulation/Gait Yes  Ambulation/Gait Assistance 4: Min guard  Ambulation/Gait Assistance Details cues on posture, walker position and step placement with gait.   Ambulation Distance (Feet) 50 Feet  Assistive device Rolling walker  Gait Pattern Step-through pattern;Decreased stance time - left;Decreased stride length;Ataxic;Lateral hip instability;Decreased trunk rotation  Ambulation Surface Level;Indoor  Neuro Re-ed   Neuro Re-ed Details  for strengthening/NMR: in tall kneeling with UE's on red pball- mini squats x 10 reps with emphasis on equal LE weight bearing and full return to tall kneeling; then worked on alternating moving LE's out/in x 10 reps each side. min guard to min assist for balance.   Manual Therapy  Manual Therapy Joint mobilization;Passive ROM  Joint Mobilization gentle grade 1-2 mobs to  tarsal and metatarsals. grade 1-2 mobs to talo-crual joint in inferior direction for improve ankle mobility/DF.                     Passive ROM to right heel cord for 30 sec holds for 5-6 reps.         PT Short Term Goals - 09/09/18 1557      PT SHORT TERM GOAL #1   Title  Pt will report compliancy with performing initial HEP to further improve independence with functional mobility     Time  4    Period  Weeks    Status  New    Target Date  10/09/18      PT SHORT TERM GOAL #2   Title  Pt will improve gait speed to >/= 2.56 ft/esc indicative of community ambulation with LRAD     Baseline  10/1: 1.96 ft/sec with RW and CGA for safety considerations     Time  4    Period  Weeks    Status  New    Target Date  10/09/18      PT SHORT TERM GOAL #3   Title  Pt  will improve Berg score to >/= 30/56 indicating decreased fall risk potential     Baseline  10/1: 25/56 indicative of high risk for falls     Time  4    Period  Weeks    Status  New    Target Date  10/09/18      PT SHORT TERM GOAL #4   Title  Pt will increased TUG score by >/= 3 seconds indicating decreased risk for falls and improvement in functional mobility using LRAD    Time  4    Period  Weeks    Status  New    Target Date  10/09/18      PT SHORT TERM GOAL #5   Title  Pt will ambulate 230 feet on level surfaces with LRAD with therapist providing CGA to improve functional mobility     Baseline  10/1: uses RW for ambulation with husband providing supervision    Time  4    Period  Weeks    Status  New    Target Date  10/09/18      Additional Short Term Goals   Additional Short Term Goals  Yes      PT SHORT TERM GOAL #6   Title  Patient will perform 12 steps with alternating stepping pattern using B HR's to improve community accessibility with therapist providing min A     Time  4    Period  Weeks    Status  New    Target Date  10/09/18        PT Long Term Goals - 09/09/18 1603      PT LONG TERM GOAL #1    Title  Pt will report compliancy and independence with HEP to further improve functional mobility     Time  8    Period  Weeks    Status  New    Target Date  11/08/18      PT LONG TERM GOAL #2   Title  Pt will improve Berg score to >/= 36/56 to decrease fall risk potential     Time  12    Period  Weeks    Status  New    Target Date  11/08/18      PT LONG TERM GOAL #3   Title  Pt will improve gait speed to >/=3.16 ft/sec indicating significant change in functional mobility with LRAD     Time  8    Period  Weeks    Status  New    Target Date  11/08/18      PT LONG TERM GOAL #4   Title  Pt will improve TUG score to </= 13.5 seconds using LRAD indicating decreased fall risk potential     Time  8    Period  Weeks    Status  New    Target Date  11/08/18      PT LONG TERM GOAL #5   Title  Pt will negotiate 12 steps using B HR's and alternating stepping technique with supervision to improve community accessibility     Time  8    Period  Weeks    Status  New    Target Date  11/08/18      PT LONG TERM GOAL #6   Title  Pt will report ability to perform household ambulation using her RW with modified independence demonstrating significant improvement in functional mobility     Baseline  Currently reports using her w/c at home  2/2 to fear of falling with RW    Time  8    Period  Weeks    Status  New    Target Date  11/08/18      PT LONG TERM GOAL #7   Title  Pt will ambulate outdoors over paved surfaces and perform curbs and inclines with supervision using LRAD for 300 feet to improve functional mobility with community distances     Time  8    Period  Weeks    Status  New    Target Date  11/08/18      PT LONG TERM GOAL #8   Title  Pt will ambulate over level surfaces with her RW for 230 feet with supervision using LRAD demonstrating improvement in functional mobility     Time  8    Period  Weeks    Status  New    Target Date  11/08/18         09/26/18 1501  Plan   Clinical Impression Statement Today's skilled session focused on LE/core strenthening, transfer training and muscle re-ed with right ankle pain reported with sit<>stands. Pt noted to have tight heel cords which improved after manual therapy with no further complaints of pain. The pt was able to particpate today without increased hip pain as well. She is interested in persuing aqautic therapy for increased strengthening without increased pain as well. Will talk with aquatic PT to get her added onto schedule.    Pt will benefit from skilled therapeutic intervention in order to improve on the following deficits Abnormal gait;Decreased balance;Decreased cognition;Decreased coordination;Decreased strength;Difficulty walking;Impaired sensation;Pain;Decreased activity tolerance;Decreased mobility;Decreased endurance;Postural dysfunction;Decreased knowledge of use of DME;Impaired perceived functional ability  Rehab Potential Good  Clinical Impairments Affecting Rehab Potential high risk for falls due to abnormal gait, ataxia, sensory changes, and impaired cognition.   PT Frequency 2x / week  PT Duration 8 weeks  PT Treatment/Interventions ADLs/Self Care Home Management;Aquatic Therapy;DME Instruction;Gait training;Stair training;Functional mobility training;Therapeutic activities;Therapeutic exercise;Balance training;Neuromuscular re-education;Patient/family education;Cognitive remediation;Orthotic Fit/Training;Electrical Stimulation;Cryotherapy;Moist Heat;Manual techniques;Passive range of motion;Taping;Energy conservation  PT Next Visit Plan follow up on aquatic therapy schedule; continue with LE strengthening and standing balance activiites  PT Home Exercise Plan Access Code: [email or web address]   Consulted and Agree with Plan of Care Patient;Family member/caregiver  Family Member Consulted spouse          Patient will benefit from skilled therapeutic intervention in order to improve the following deficits and  impairments:  Abnormal gait, Decreased balance, Decreased cognition, Decreased coordination, Decreased strength, Difficulty walking, Impaired sensation, Pain, Decreased activity tolerance, Decreased mobility, Decreased endurance, Postural dysfunction, Decreased knowledge of use of DME, Impaired perceived functional ability  Visit Diagnosis: Unsteadiness on feet  Other abnormalities of gait and mobility  Other disturbances of skin sensation     Problem List Patient Active Problem List   Diagnosis Date Noted  . 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 Weakness with dizziness-  since [facility] and CVA 04/07/17 08/07/2017  . Disturbances of vision, late effect of stroke 08/06/2017  . Health education/counseling 08/05/2017  . High risk medications (not anticoagulants) long-term use 08/05/2017  . Neuritis-  R sided:  arm and leg/ body due to stroke 08/05/2017  . Elevated LDL cholesterol level 07/11/2017  . [employer] (NIH) Stroke Scale limb ataxia score 2, ataxia present in two limbs 06/25/2017  . 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 Elevated vitamin B12 level 05/30/2017  . Vitamin D deficiency 05/29/2017  .  History of tobacco abuse-  30pk yr hx - quit 04/07/17 05/21/2017  . Gait disturbance, post-stroke 05/14/2017  . Benign essential HTN   . Vitreous hemorrhage of right eye ([place])   . Adjustment disorder with mixed anxiety and depressed mood   . 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 s/p SAH (subarachnoid hemorrhage) ([place]) 04/19/2017  . 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 Hypoxia   . Subarachnoid hemorrhage due to ruptured aneurysm ([place]) 04/07/2017  . CVA (cerebral vascular accident) ([place]) 04/07/2017  . Elevated gastrin level 01/25/2012  . Hypokalemia 01/21/2012  . Nausea & vomiting 01/20/2012  . Epigastric pain 01/20/2012  . Duodenal ulcer, acute with obstruction 10/17/2011  . S/P  laparoscopic cholecystectomy 10/16/2011    Willow Ora, PTA, [facility] [address], [place], [place] [address] [phone] 09/28/18, 9:53 PM   Name: AFTEN LIPSEY MRN: [id number] Date of Birth: 07/07/1976

## 2018-09-29 ENCOUNTER — Ambulatory Visit: Payer: BLUE CROSS/BLUE SHIELD | Admitting: Physical Therapy

## 2018-09-29 DIAGNOSIS — R2689 Other abnormalities of gait and mobility: Secondary | ICD-10-CM

## 2018-09-29 DIAGNOSIS — R278 Other lack of coordination: Secondary | ICD-10-CM

## 2018-09-29 DIAGNOSIS — R2681 Unsteadiness on feet: Secondary | ICD-10-CM | POA: Diagnosis not present

## 2018-09-29 NOTE — Patient Instructions (Signed)
Gastroc / Heel Cord Stretch - On Step    Stand with heels over edge of stair. Holding rail, lower heels until stretch is felt in calf of legs. Repeat _1-2_ times. Do _1-2__ times per day. HOLD 30 secs.;   LEAVE LEFT FOOT COMPLETELY ON STEP - only right heel hangs off edge of step

## 2018-09-30 ENCOUNTER — Encounter: Payer: Self-pay | Admitting: Physical Therapy

## 2018-09-30 NOTE — Therapy (Signed)
[facility] [address] [place], [place], [address] Phone: [phone]   Fax:  [phone]  Physical Therapy Treatment  Patient Details  Name: [name] Barnett MRN: [id number] Date of Birth: 1976/10/22 Referring Provider (PT): Margarita Rana, MD   Encounter Date: 09/29/2018  PT End of Session - 09/30/18 1854    Visit Number  5    Number of Visits  17    Date for PT Re-Evaluation  11/08/18    Authorization Type  [employer]    PT Start Time  1534    PT Stop Time  1620    PT Time Calculation (min)  46 min    Equipment Utilized During Treatment  Gait belt       Past Medical History:  Diagnosis Date  . Duodenal obstruction   . GERD (gastroesophageal reflux disease)   . Hypertension   . Stroke [facility])    april 2018, lt sided weakness  . Trimalleolar fracture of ankle, closed, right, initial encounter     Past Surgical History:  Procedure Laterality Date  . BALLOON DILATION  12/31/2011   Procedure: BALLOON DILATION;  Surgeon: Barrie Folk, MD;  Location: [facility] ENDOSCOPY;  Service: Endoscopy;  Laterality: N/A;  . CHOLECYSTECTOMY  07/28/11  . ESOPHAGOGASTRODUODENOSCOPY  10/17/2011   Procedure: ESOPHAGOGASTRODUODENOSCOPY (EGD);  Surgeon: Barrie Folk, MD;  Location: [facility] ENDOSCOPY;  Service: Endoscopy;  Laterality: N/A;  . IR ANGIO INTRA EXTRACRAN SEL INTERNAL CAROTID BILAT MOD SED  04/07/2017  . IR ANGIO INTRA EXTRACRAN SEL INTERNAL CAROTID BILAT MOD SED  01/07/2018  . IR ANGIO VERTEBRAL SEL VERTEBRAL UNI R MOD SED  04/07/2017  . IR ANGIO VERTEBRAL SEL VERTEBRAL UNI R MOD SED  01/07/2018  . IR ANGIOGRAM FOLLOW UP STUDY  04/07/2017  . IR ANGIOGRAM FOLLOW UP STUDY  04/07/2017  . IR ANGIOGRAM FOLLOW UP STUDY  04/07/2017  . IR ANGIOGRAM FOLLOW UP STUDY  04/07/2017  . IR ANGIOGRAM FOLLOW UP STUDY  04/07/2017  . IR ANGIOGRAM SELECTIVE EACH ADDITIONAL VESSEL  04/07/2017  . IR TRANSCATH/EMBOLIZ  04/07/2017  . IR US GUIDE VASC ACCESS RIGHT   01/07/2018  . ORIF ANKLE FRACTURE Right 07/01/2018   Procedure: RIGHT ANKLE FRACTURE OPEN TREATMENT TRIMALLEOLAR INCLUDES INTERNAL FIXATION WITHOUT FIXATION OF POSTERIOR LIP, FRACTURE CLOSED TREATMENT TRIMALLEOLAR ANKLE WITH MANIPULATION, REPAIR SYNDESMOSIS, REPAIR DELTOID LIGAMENT;  Surgeon: Sheral Apley, MD;  Location: [facility];  Service: Orthopedics;  Laterality: Right;  . PARS PLANA VITRECTOMY Right 05/01/2017   Procedure: PARS PLANA VITRECTOMY WITH 25 GAUGE RIGHT EYE, endolaser photocoaglation;  Surgeon: Carmela Rima, MD;  Location: [facility] OR;  Service: Ophthalmology;  Laterality: Right;  . RADIOLOGY WITH ANESTHESIA N/A 04/07/2017   Procedure: RADIOLOGY WITH ANESTHESIA;  Surgeon: Lisbeth Renshaw, MD;  Location: [facility] OR;  Service: Radiology;  Laterality: N/A;  . REPLACEMENT TOTAL KNEE BILATERAL  2004  . TEMPOROMANDIBULAR JOINT SURGERY     2 surgeries  . TUMOR REMOVAL    . VAGOTOMY  01/23/2012   Procedure: VAGOTOMY, antrectomy and BII;  Surgeon: Currie Paris, MD;  Location: [facility] OR;  Service: General;  Laterality: N/A;  Laparotomy with vagotomy.    There were no vitals filed for this visit.  Subjective Assessment - 09/30/18 1659    Subjective  Pt states she is doing OK - reports her left ankle is very tight, has been trying to stretch some at home with her strap    Patient is accompained by:  Family  member   mother   Patient Stated Goals  Pt reports goal of walking with no AD and improve balance to reduce fall risk    Currently in Pain?  Yes    Pain Score  4     Pain Location  Hip    Pain Orientation  Right    Pain Descriptors / Indicators  Sharp;Stabbing;Radiating    Pain Type  Chronic pain    Pain Onset  More than a month ago                       [facility] Adult PT Treatment/Exercise - 09/30/18 0001      Transfers   Transfers  Sit to Stand;Stand to Sit    Sit to Stand  5: Supervision    Stand to Sit  5: Supervision    Number of Reps  Other  reps (comment)   5   Comments  worked on pre sit to stand with Rt heel cord stretching - Rt foot back- pt performed anterior weight shift sufficient to lift hips off mat - hold for 2 secs:  5 reps with CGA      Ambulation/Gait   Ambulation/Gait  Yes    Ambulation/Gait Assistance  4: Min guard    Ambulation/Gait Assistance Details  tactile cue to reduce Lt hip protraction for incr. pelvic stability     Ambulation Distance (Feet)  115 Feet   2 reps   Assistive device  Rolling walker    Gait Pattern  Step-through pattern;Decreased stance time - left;Decreased stride length;Ataxic;Lateral hip instability;Decreased trunk rotation    Ambulation Surface  Level;Indoor      Knee/Hip Exercises: Stretches   Research scientist (medical) reps;30 seconds   with 2" step - with UE support on RW     Neuro Re-ed;  Tall kneeling on mat on floor:  Partial squats back onto heels x 10 reps:  Rt 1/2 kneeling - head turns horizontal  And vertical x 5 reps each  Modified quadruped - leaning on forearms on mat - hip extension x 10 reps each leg with CGA Tall kneeling - horizontal and vertical head turns 5 reps each direction     PT Education - 09/30/18 1853    Education provided  Yes    Education Details  instructed pt in Lt foot massage for plantar fasciitis with use of tennis ball    Person(s) Educated  Patient;Parent(s)    Methods  Explanation    Comprehension  Verbalized understanding;Returned demonstration       PT Short Term Goals - 09/09/18 1557      PT SHORT TERM GOAL #1   Title  Pt will report compliancy with performing initial HEP to further improve independence with functional mobility     Time  4    Period  Weeks    Status  New    Target Date  10/09/18      PT SHORT TERM GOAL #2   Title  Pt will improve gait speed to >/= 2.56 ft/esc indicative of community ambulation with LRAD     Baseline  10/1: 1.96 ft/sec with RW and CGA for safety considerations     Time  4    Period  Weeks     Status  New    Target Date  10/09/18      PT SHORT TERM GOAL #3   Title  Pt will improve Berg score to >/= 30/56 indicating decreased fall  risk potential     Baseline  10/1: 25/56 indicative of high risk for falls     Time  4    Period  Weeks    Status  New    Target Date  10/09/18      PT SHORT TERM GOAL #4   Title  Pt will increased TUG score by >/= 3 seconds indicating decreased risk for falls and improvement in functional mobility using LRAD    Time  4    Period  Weeks    Status  New    Target Date  10/09/18      PT SHORT TERM GOAL #5   Title  Pt will ambulate 230 feet on level surfaces with LRAD with therapist providing CGA to improve functional mobility     Baseline  10/1: uses RW for ambulation with husband providing supervision    Time  4    Period  Weeks    Status  New    Target Date  10/09/18      Additional Short Term Goals   Additional Short Term Goals  Yes      PT SHORT TERM GOAL #6   Title  Patient will perform 12 steps with alternating stepping pattern using B HR's to improve community accessibility with therapist providing min A     Time  4    Period  Weeks    Status  New    Target Date  10/09/18        PT Long Term Goals - 09/09/18 1603      PT LONG TERM GOAL #1   Title  Pt will report compliancy and independence with HEP to further improve functional mobility     Time  8    Period  Weeks    Status  New    Target Date  11/08/18      PT LONG TERM GOAL #2   Title  Pt will improve Berg score to >/= 36/56 to decrease fall risk potential     Time  12    Period  Weeks    Status  New    Target Date  11/08/18      PT LONG TERM GOAL #3   Title  Pt will improve gait speed to >/=3.16 ft/sec indicating significant change in functional mobility with LRAD     Time  8    Period  Weeks    Status  New    Target Date  11/08/18      PT LONG TERM GOAL #4   Title  Pt will improve TUG score to </= 13.5 seconds using LRAD indicating decreased fall risk  potential     Time  8    Period  Weeks    Status  New    Target Date  11/08/18      PT LONG TERM GOAL #5   Title  Pt will negotiate 12 steps using B HR's and alternating stepping technique with supervision to improve community accessibility     Time  8    Period  Weeks    Status  New    Target Date  11/08/18      PT LONG TERM GOAL #6   Title  Pt will report ability to perform household ambulation using her RW with modified independence demonstrating significant improvement in functional mobility     Baseline  Currently reports using her w/c at home 2/2 to fear of falling with RW  Time  8    Period  Weeks    Status  New    Target Date  11/08/18      PT LONG TERM GOAL #7   Title  Pt will ambulate outdoors over paved surfaces and perform curbs and inclines with supervision using LRAD for 300 feet to improve functional mobility with community distances     Time  8    Period  Weeks    Status  New    Target Date  11/08/18      PT LONG TERM GOAL #8   Title  Pt will ambulate over level surfaces with her RW for 230 feet with supervision using LRAD demonstrating improvement in functional mobility     Time  8    Period  Weeks    Status  New    Target Date  11/08/18            Plan - 09/30/18 1855    Clinical Impression Statement  Pt demonstrates Rt heel cord tightness but able to tolerate heel cord stretch in standing with use of 2" step.  Pt demonstrates increased Lt hip protraction in swing phase of gait of LLE.  Pt tolerated modified quadruped position well with use of mat for support, but needed min assist to maintain Lt hip extension while performing knee flexion/extension.      Rehab Potential  Good    Clinical Impairments Affecting Rehab Potential  high risk for falls due to abnormal gait, ataxia, sensory changes, and impaired cognition.     PT Frequency  2x / week    PT Duration  8 weeks    PT Next Visit Plan  continue with LE strengthening and standing balance  activiites, trunk control in tall kneeling; aquatic therapy to be scheduled - depending on availiability    PT Home Exercise Plan  Access Code: [email or web address]     Consulted and Agree with Plan of Care  Patient       Patient will benefit from skilled therapeutic intervention in order to improve the following deficits and impairments:  Abnormal gait, Decreased balance, Decreased cognition, Decreased coordination, Decreased strength, Difficulty walking, Impaired sensation, Pain, Decreased activity tolerance, Decreased mobility, Decreased endurance, Postural dysfunction, Decreased knowledge of use of DME, Impaired perceived functional ability  Visit Diagnosis: Other abnormalities of gait and mobility  Unsteadiness on feet  Other lack of coordination     Problem List Patient Active Problem List   Diagnosis Date Noted  . 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 Weakness with dizziness-  since [facility] and CVA 04/07/17 08/07/2017  . Disturbances of vision, late effect of stroke 08/06/2017  . Health education/counseling 08/05/2017  . High risk medications (not anticoagulants) long-term use 08/05/2017  . Neuritis-  R sided:  arm and leg/ body due to stroke 08/05/2017  . Elevated LDL cholesterol level 07/11/2017  . [employer] (NIH) Stroke Scale limb ataxia score 2, ataxia present in two limbs 06/25/2017  . 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 Elevated vitamin B12 level 05/30/2017  . Vitamin D deficiency 05/29/2017  . History of tobacco abuse-  30pk yr hx - quit 04/07/17 05/21/2017  . Gait disturbance, post-stroke 05/14/2017  . Benign essential HTN   . Vitreous hemorrhage of right eye ([name])   . Adjustment disorder with mixed anxiety and depressed mood   . 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  s/p SAH (subarachnoid hemorrhage) ([name]) 04/19/2017  . Basilar artery aneurysm ([name])   . Hypoxia   .  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 CVA (cerebral vascular accident) ([name]) 04/07/2017  . Elevated gastrin level 01/25/2012  . Hypokalemia 01/21/2012  . Nausea & vomiting 01/20/2012  . Epigastric pain 01/20/2012  . Duodenal ulcer, acute with obstruction 10/17/2011  . S/P laparoscopic cholecystectomy 10/16/2011    Kary Kos, PT 09/30/2018, 7:06 PM  [facility] [address] [place], [place], [address] Phone: [phone]   Fax:  [phone]  Name: [name] Barnett MRN: [id number] Date of Birth: Mar 03, 1976

## 2018-10-02 ENCOUNTER — Telehealth: Payer: Self-pay | Admitting: Physical Therapy

## 2018-10-02 ENCOUNTER — Encounter: Payer: Self-pay | Admitting: Physical Therapy

## 2018-10-02 ENCOUNTER — Ambulatory Visit: Payer: BLUE CROSS/BLUE SHIELD | Admitting: Physical Therapy

## 2018-10-02 DIAGNOSIS — R2681 Unsteadiness on feet: Secondary | ICD-10-CM | POA: Diagnosis not present

## 2018-10-02 DIAGNOSIS — R278 Other lack of coordination: Secondary | ICD-10-CM

## 2018-10-02 DIAGNOSIS — R260 Ataxic gait: Secondary | ICD-10-CM

## 2018-10-02 DIAGNOSIS — R2689 Other abnormalities of gait and mobility: Secondary | ICD-10-CM

## 2018-10-02 DIAGNOSIS — I69054 Hemiplegia and hemiparesis following nontraumatic subarachnoid hemorrhage affecting left non-dominant side: Secondary | ICD-10-CM

## 2018-10-02 NOTE — Telephone Encounter (Signed)
Hello Dr. Wynn Banker, Now that we have our aquatic therapy program we feel Jennell would be appropriate for aquatic therapy with Mackie Pai, OT.  She currently does not have an active OT care plan and would need to be evaluated by Clydie Braun before starting aquatic therapy.  If you agree with this plan, would you please enter an OT eval and treat order into Epic?  Thanks! Dierdre Highman, PT, DPT 10/02/18    5:04 PM

## 2018-10-02 NOTE — Therapy (Signed)
[facility] [address] [place], [place], [address] Phone: [phone]   Fax:  [phone]  Physical Therapy Treatment  Patient Details  Name: [name] Barnett MRN: [id number] Date of Birth: 1976/04/13 Referring Provider (PT): Margarita Rana, MD   Encounter Date: 10/02/2018  PT End of Session - 10/02/18 1651    Visit Number  6    Number of Visits  17    Date for PT Re-Evaluation  11/08/18    Authorization Type  [employer]    PT Start Time  1450    PT Stop Time  1535    PT Time Calculation (min)  45 min    Activity Tolerance  Patient tolerated treatment well    Behavior During Therapy  [facility] for tasks assessed/performed       Past Medical History:  Diagnosis Date  . Duodenal obstruction   . GERD (gastroesophageal reflux disease)   . Hypertension   . Stroke [facility])    april 2018, lt sided weakness  . Trimalleolar fracture of ankle, closed, right, initial encounter     Past Surgical History:  Procedure Laterality Date  . BALLOON DILATION  12/31/2011   Procedure: BALLOON DILATION;  Surgeon: Barrie Folk, MD;  Location: [facility] ENDOSCOPY;  Service: Endoscopy;  Laterality: N/A;  . CHOLECYSTECTOMY  07/28/11  . ESOPHAGOGASTRODUODENOSCOPY  10/17/2011   Procedure: ESOPHAGOGASTRODUODENOSCOPY (EGD);  Surgeon: Barrie Folk, MD;  Location: [facility] ENDOSCOPY;  Service: Endoscopy;  Laterality: N/A;  . IR ANGIO INTRA EXTRACRAN SEL INTERNAL CAROTID BILAT MOD SED  04/07/2017  . IR ANGIO INTRA EXTRACRAN SEL INTERNAL CAROTID BILAT MOD SED  01/07/2018  . IR ANGIO VERTEBRAL SEL VERTEBRAL UNI R MOD SED  04/07/2017  . IR ANGIO VERTEBRAL SEL VERTEBRAL UNI R MOD SED  01/07/2018  . IR ANGIOGRAM FOLLOW UP STUDY  04/07/2017  . IR ANGIOGRAM FOLLOW UP STUDY  04/07/2017  . IR ANGIOGRAM FOLLOW UP STUDY  04/07/2017  . IR ANGIOGRAM FOLLOW UP STUDY  04/07/2017  . IR ANGIOGRAM FOLLOW UP STUDY  04/07/2017  . IR ANGIOGRAM SELECTIVE EACH ADDITIONAL VESSEL  04/07/2017  . IR  TRANSCATH/EMBOLIZ  04/07/2017  . IR US GUIDE VASC ACCESS RIGHT  01/07/2018  . ORIF ANKLE FRACTURE Right 07/01/2018   Procedure: RIGHT ANKLE FRACTURE OPEN TREATMENT TRIMALLEOLAR INCLUDES INTERNAL FIXATION WITHOUT FIXATION OF POSTERIOR LIP, FRACTURE CLOSED TREATMENT TRIMALLEOLAR ANKLE WITH MANIPULATION, REPAIR SYNDESMOSIS, REPAIR DELTOID LIGAMENT;  Surgeon: Sheral Apley, MD;  Location: [facility];  Service: Orthopedics;  Laterality: Right;  . PARS PLANA VITRECTOMY Right 05/01/2017   Procedure: PARS PLANA VITRECTOMY WITH 25 GAUGE RIGHT EYE, endolaser photocoaglation;  Surgeon: Carmela Rima, MD;  Location: [facility] OR;  Service: Ophthalmology;  Laterality: Right;  . RADIOLOGY WITH ANESTHESIA N/A 04/07/2017   Procedure: RADIOLOGY WITH ANESTHESIA;  Surgeon: Lisbeth Renshaw, MD;  Location: [facility] OR;  Service: Radiology;  Laterality: N/A;  . REPLACEMENT TOTAL KNEE BILATERAL  2004  . TEMPOROMANDIBULAR JOINT SURGERY     2 surgeries  . TUMOR REMOVAL    . VAGOTOMY  01/23/2012   Procedure: VAGOTOMY, antrectomy and BII;  Surgeon: Currie Paris, MD;  Location: [facility] OR;  Service: General;  Laterality: N/A;  Laparotomy with vagotomy.    There were no vitals filed for this visit.  Subjective Assessment - 10/02/18 1456    Subjective  R ankle still feels tight.  Still stretches it on the step and treadmill and it feels better.  Had to go buy  a new RW, car backed over her current rolling walker and bent it.    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  No/denies    Pain Onset  More than a month ago                       [facility] PT Treatment/Exercise - 10/02/18 1635      Transfers   Transfers   Sit to Stand;Stand to Sit    Sit to Stand  4: Min guard    Sit to Stand Details (indicate cue type and reason)  from low blue bench to simulate low couch at home with L arm rest to push from and RW in front of pt.  With cues from therapist for foot placement and full anterior weight shift over BOS pt able to stand with UE support with supervision; when attempting to perform without UE support pt requires min A.  Cued pt to place one hand forwards on RW as feedback and control of forward weight shift with pt demonstrating improved technique and decreased assistance from therapist    Stand to Sit  4: Min guard      Ambulation/Gait   Ambulation/Gait  Yes    Ambulation/Gait Assistance  4: Min guard    Ambulation/Gait Assistance Details  attempted to use visual cues for more normal BOS during gait but pt unable to maintain control of RW    Ambulation Distance (Feet)  115 Feet    Assistive device  Rolling walker    Gait Pattern  Step-through pattern;Decreased stance time - left;Decreased stride length;Ataxic;Lateral hip instability;Wide base of support    Ambulation Surface  Level;Indoor    Stairs  Yes    Stairs Assistance  4: Min assist    Stairs Assistance Details (indicate cue type and reason)  facilitation at pelvis for increased stability and verbal cues for more controlled foot placement, weight shift forwards before pushing through stance LE; cues to decrease pulling with UE.  Also required assistance to control forward weight shift when descending    Stair Management Technique  Two rails;Alternating pattern;Forwards    Number of Stairs  12    Height of Stairs  6      Neuro Re-ed    Neuro Re-ed Details   Sit <> stand weight shift and motor control training.  Pt placed bilat UE on swiss ball in front of her and focused on increased trunk elongation during forward lean to shift COG forwards over BOS prior to initiating LE extension for increased control during sit > stand.  When returning to sit  focused on anterior leaning to shift weight trunk forwards as COG moves posterior to control stand > sit.  After 8 reps pushing ball out in midline transitioned to pushing the ball slightly to the left to increased weight shift to LLE during sit <> stand.  Practiced carryover of weight shift and technique to functional sit <> stand from simulated low couch due to pt having increased difficulty standing from couch at home.             PT Education - 10/02/18 1650    Education provided  Yes    Education Details  will have to obtain order for OT eval and treat to begin aquatic in Nov (PT waitlist is currently December)    Person(s) Educated  Patient;Spouse    Methods  Explanation    Comprehension  Verbalized understanding       PT Short Term Goals - 09/09/18 1557      PT SHORT TERM GOAL #1   Title  Pt will report compliancy with performing initial HEP to further improve independence with functional mobility     Time  4    Period  Weeks    Status  New    Target Date  10/09/18      PT SHORT TERM GOAL #2   Title  Pt will improve gait speed to >/= 2.56 ft/esc indicative of community ambulation with LRAD     Baseline  10/1: 1.96 ft/sec with RW and CGA for safety considerations     Time  4    Period  Weeks    Status  New    Target Date  10/09/18      PT SHORT TERM GOAL #3   Title  Pt will improve Berg score to >/= 30/56 indicating decreased fall risk potential     Baseline  10/1: 25/56 indicative of high risk for falls     Time  4    Period  Weeks    Status  New    Target Date  10/09/18      PT SHORT TERM GOAL #4   Title  Pt will increased TUG score by >/= 3 seconds indicating decreased risk for falls and improvement in functional mobility using LRAD    Time  4    Period  Weeks    Status  New    Target Date  10/09/18      PT SHORT TERM GOAL #5   Title  Pt will ambulate 230 feet on level surfaces with LRAD with therapist providing CGA to improve functional mobility      Baseline  10/1: uses RW for ambulation with husband providing supervision    Time  4    Period  Weeks    Status  New    Target Date  10/09/18      Additional Short Term Goals   Additional Short Term Goals  Yes      PT SHORT TERM GOAL #6   Title  Patient will perform 12 steps with alternating stepping pattern using B HR's to improve community accessibility with therapist providing min A     Time  4    Period  Weeks    Status  New    Target Date  10/09/18        PT Long Term Goals - 09/09/18 1603      PT LONG TERM GOAL #1   Title  Pt will report compliancy and independence with HEP to further improve functional mobility     Time  8    Period  Weeks    Status  New    Target Date  11/08/18      PT LONG TERM GOAL #2   Title  Pt will improve Berg score to >/= 36/56 to decrease fall risk potential     Time  12    Period  Weeks    Status  New    Target Date  11/08/18      PT LONG TERM GOAL #3   Title  Pt will  improve gait speed to >/=3.16 ft/sec indicating significant change in functional mobility with LRAD     Time  8    Period  Weeks    Status  New    Target Date  11/08/18      PT LONG TERM GOAL #4   Title  Pt will improve TUG score to </= 13.5 seconds using LRAD indicating decreased fall risk potential     Time  8    Period  Weeks    Status  New    Target Date  11/08/18      PT LONG TERM GOAL #5   Title  Pt will negotiate 12 steps using B HR's and alternating stepping technique with supervision to improve community accessibility     Time  8    Period  Weeks    Status  New    Target Date  11/08/18      PT LONG TERM GOAL #6   Title  Pt will report ability to perform household ambulation using her RW with modified independence demonstrating significant improvement in functional mobility     Baseline  Currently reports using her w/c at home 2/2 to fear of falling with RW    Time  8    Period  Weeks    Status  New    Target Date  11/08/18      PT LONG TERM GOAL  #7   Title  Pt will ambulate outdoors over paved surfaces and perform curbs and inclines with supervision using LRAD for 300 feet to improve functional mobility with community distances     Time  8    Period  Weeks    Status  New    Target Date  11/08/18      PT LONG TERM GOAL #8   Title  Pt will ambulate over level surfaces with her RW for 230 feet with supervision using LRAD demonstrating improvement in functional mobility     Time  8    Period  Weeks    Status  New    Target Date  11/08/18            Plan - 10/02/18 1651    Clinical Impression Statement  With repeated practice and feedback from therapist pt demonstrated improved forward weight shift, control and timing of sit <> stand; pt also able to demonstrate carryover to simulated sit <> stand from low couch.  Continued to focus on slow, controlled weight shifting and movement during stair negotiation with alternating sequence.  Continues to demonstrate increased difficulty with motor control and coordination during gait.  Pt is agreeable to aquatic therapy but will need OT eval and treat to be able to initiate aquatic in November.      Rehab Potential  Good    Clinical Impairments Affecting Rehab Potential  high risk for falls due to abnormal gait, ataxia, sensory changes, and impaired cognition.     PT Frequency  2x / week    PT Duration  8 weeks    PT Treatment/Interventions  ADLs/Self Care Home Management;Aquatic Therapy;DME Instruction;Gait training;Stair training;Functional mobility training;Therapeutic activities;Therapeutic exercise;Balance training;Neuromuscular re-education;Patient/family education;Cognitive remediation;Orthotic Fit/Training;Electrical Stimulation;Cryotherapy;Moist Heat;Manual techniques;Passive range of motion;Taping;Energy conservation    PT Next Visit Plan  slow down movements and focus on control of movement (sit <> stand, gait, weight shifting, stairs).  continue with LE strengthening and standing  balance activities, trunk control in tall kneeling; aquatic therapy to be scheduled - depending on availiability  PT Home Exercise Plan  Access Code: [email or web address]     Consulted and Agree with Plan of Care  Patient       Patient will benefit from skilled therapeutic intervention in order to improve the following deficits and impairments:  Abnormal gait, Decreased balance, Decreased cognition, Decreased coordination, Decreased strength, Difficulty walking, Impaired sensation, Pain, Decreased activity tolerance, Decreased mobility, Decreased endurance, Postural dysfunction, Decreased knowledge of use of DME, Impaired perceived functional ability  Visit Diagnosis: Ataxic gait  Other lack of coordination  Other abnormalities of gait and mobility  Unsteadiness on feet     Problem List Patient Active Problem List   Diagnosis Date Noted  . 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 Weakness with dizziness-  since [facility] and CVA 04/07/17 08/07/2017  . Disturbances of vision, late effect of stroke 08/06/2017  . Health education/counseling 08/05/2017  . High risk medications (not anticoagulants) long-term use 08/05/2017  . Neuritis-  R sided:  arm and leg/ body due to stroke 08/05/2017  . Elevated LDL cholesterol level 07/11/2017  . [employer] (NIH) Stroke Scale limb ataxia score 2, ataxia present in two limbs 06/25/2017  . 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 Elevated vitamin B12 level 05/30/2017  . Vitamin D deficiency 05/29/2017  . History of tobacco abuse-  30pk yr hx - quit 04/07/17 05/21/2017  . Gait disturbance, post-stroke 05/14/2017  . Benign essential HTN   . Vitreous hemorrhage of right eye ([name])   . Adjustment disorder with mixed anxiety and depressed mood   . 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 s/p SAH (subarachnoid hemorrhage) ([name]) 04/19/2017  . Basilar artery aneurysm  ([name])   . Hypoxia   . 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 CVA (cerebral vascular accident) ([name]) 04/07/2017  . Elevated gastrin level 01/25/2012  . Hypokalemia 01/21/2012  . Nausea & vomiting 01/20/2012  . Epigastric pain 01/20/2012  . Duodenal ulcer, acute with obstruction 10/17/2011  . S/P laparoscopic cholecystectomy 10/16/2011    Dierdre Highman, PT, DPT 10/02/18    4:58 PM    [facility] [address] [place], [place], [address] Phone: [phone]   Fax:  [phone]  Name: [name] Barnett MRN: [id number] Date of Birth: 12/10/1976

## 2018-10-06 NOTE — Telephone Encounter (Signed)
I believe this has been done

## 2018-10-07 ENCOUNTER — Ambulatory Visit: Payer: BLUE CROSS/BLUE SHIELD | Admitting: Rehabilitation

## 2018-10-07 ENCOUNTER — Encounter: Payer: Self-pay | Admitting: Rehabilitation

## 2018-10-07 ENCOUNTER — Ambulatory Visit (HOSPITAL_BASED_OUTPATIENT_CLINIC_OR_DEPARTMENT_OTHER): Payer: BLUE CROSS/BLUE SHIELD | Admitting: Physical Medicine & Rehabilitation

## 2018-10-07 ENCOUNTER — Encounter: Payer: Self-pay | Admitting: Physical Medicine & Rehabilitation

## 2018-10-07 ENCOUNTER — Encounter: Payer: BLUE CROSS/BLUE SHIELD | Attending: Physical Medicine & Rehabilitation

## 2018-10-07 VITALS — BP 138/99 | HR 85 | Ht 64.0 in | Wt 170.0 lb

## 2018-10-07 DIAGNOSIS — R260 Ataxic gait: Secondary | ICD-10-CM

## 2018-10-07 DIAGNOSIS — I69054 Hemiplegia and hemiparesis following nontraumatic subarachnoid hemorrhage affecting left non-dominant side: Secondary | ICD-10-CM

## 2018-10-07 DIAGNOSIS — R42 Dizziness and giddiness: Secondary | ICD-10-CM | POA: Diagnosis not present

## 2018-10-07 DIAGNOSIS — I69093 Ataxia following nontraumatic subarachnoid hemorrhage: Secondary | ICD-10-CM | POA: Diagnosis present

## 2018-10-07 DIAGNOSIS — M792 Neuralgia and neuritis, unspecified: Secondary | ICD-10-CM | POA: Diagnosis not present

## 2018-10-07 DIAGNOSIS — R2689 Other abnormalities of gait and mobility: Secondary | ICD-10-CM

## 2018-10-07 DIAGNOSIS — R2681 Unsteadiness on feet: Secondary | ICD-10-CM | POA: Diagnosis not present

## 2018-10-07 DIAGNOSIS — M1611 Unilateral primary osteoarthritis, right hip: Secondary | ICD-10-CM | POA: Diagnosis not present

## 2018-10-07 DIAGNOSIS — I608 Other nontraumatic subarachnoid hemorrhage: Secondary | ICD-10-CM | POA: Diagnosis not present

## 2018-10-07 DIAGNOSIS — I69393 Ataxia following cerebral infarction: Secondary | ICD-10-CM

## 2018-10-07 DIAGNOSIS — I725 Aneurysm of other precerebral arteries: Secondary | ICD-10-CM | POA: Diagnosis present

## 2018-10-07 DIAGNOSIS — R278 Other lack of coordination: Secondary | ICD-10-CM

## 2018-10-07 MED ORDER — ACETAMINOPHEN-CODEINE #3 300-30 MG PO TABS: 1.0000 | ORAL_TABLET | ORAL | 0 refills | Status: DC | PRN

## 2018-10-07 NOTE — Progress Notes (Signed)
Subjective:    Patient ID: [name] Barnett, female    DOB: [birth year], 42 y.o.   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  Also has right hemisensory deficits including pain and temperature  Patient stroke symptoms are most likely related to midbrain infarct which is fed by the PCA.  She also has some symptoms consistent with medullary infarct with contralateral pain temperature sensory deficits.  She had dysesthetic pain, the upper extremity responded better to the gabapentin in the lower extremity.  The patient had a femoral sheath on the right side and because of anterior thigh pain a femoral nerve injury was suspected.  She did undergo femoral nerve block under ultrasound guidance which gave her anesthetic phase relief.  Patient was also noted to have pain in the groin area that increased with hip range of motion therefore right hip x-ray was performed and this showed moderate osteoarthritis of the hip.  She underwent intra-articular injection of the hip on 05/12/2018 which gave her about 2 months relief and a repeat injection on 08/12/2018 only gave a couple hours relief, i.e. anesthetic phase. She plans to follow-up with her orthopedic surgeon Dr. Eulah Pont. She denies any back pain she has no pain radiating below the knee on the right side. She continues to ambulate with a rolling walker due to severe ataxia left greater than right side  Pain Inventory Average Pain 5 Pain Right Now 5 My pain is sharp, stabbing, tingling and aching  In the last 24 hours, has pain interfered with the following? General activity 7 Relation with others 1 Enjoyment of life 9 What TIME of day is your pain at its worst? all Sleep (in general) Good  Pain is worse with: sitting and inactivity Pain improves with: rest, therapy/exercise and injections Relief from Meds:  1  Mobility walk with assistance use a walker ability to climb steps?  yes do you drive?  no Do you have any goals in this area?  yes  Function not employed: date last employed . I need assistance with the following:  meal prep, household duties and shopping  Neuro/Psych bladder control problems numbness tingling trouble walking  Prior Studies Any changes since last visit?  no  Physicians involved in your care Any changes since last visit?  no   Family History  Problem Relation Age of Onset  . Hypertension Mother   . Restless legs syndrome Mother   . Hyperlipidemia Mother   . Heart disease Father   . Malignant hyperthermia Neg Hx    Social History   Socioeconomic History  . Marital status: Married    Spouse name: Not on file  . Number of children: Not on file  . Years of education: Not on file  . Highest education level: Not on file  Occupational History  . Not on file  Social Needs  . Financial resource strain: Not on file  . Food insecurity:    Worry: Not on file    Inability: Not on file  . Transportation needs:    Medical: Not on file    Non-medical: Not on file  Tobacco Use  . Smoking status: Current Every Day Smoker    Packs/day: 1.00    Years: 20.00    Pack years: 20.00    Types: Cigarettes    Last attempt to quit: 06/24/2018    Years since quitting:  0.2  . Smokeless tobacco: Never Used  Substance and Sexual Activity  . Alcohol use: Yes    Comment: social  . Drug use: No  . Sexual activity: Not Currently    Partners: Male    Birth control/protection: None  Lifestyle  . Physical activity:    Days per week: Not on file    Minutes per session: Not on file  . Stress: Not on file  Relationships  . Social connections:    Talks on phone: Not on file    Gets together: Not on file    Attends religious service: Not on file    Active member of club or organization: Not on file    Attends meetings of clubs or organizations: Not on file     Relationship status: Not on file  Other Topics Concern  . Not on file  Social History Narrative   Lives in [place] with husband and 2 kids. Works as a Psychologist, prison and probation services.   Past Surgical History:  Procedure Laterality Date  . BALLOON DILATION  12/31/2011   Procedure: BALLOON DILATION;  Surgeon: Barrie Folk, MD;  Location: [facility] ENDOSCOPY;  Service: Endoscopy;  Laterality: N/A;  . CHOLECYSTECTOMY  07/28/11  . ESOPHAGOGASTRODUODENOSCOPY  10/17/2011   Procedure: ESOPHAGOGASTRODUODENOSCOPY (EGD);  Surgeon: Barrie Folk, MD;  Location[facility] END[facility]COPY;  Service: Endoscopy;  Laterality: N/A;  . IR ANGIO INTRA EXTRACRAN SEL INTERNAL CAROTID BILAT MOD SED  04/07/2017  . IR ANGIO INTRA EXTRACRAN SEL INTERNAL CAROTID BILAT MOD SED  01/07/2018  . IR ANGIO VERTEBRAL SEL VERTEBRAL UNI R MOD SED  04/07/2017  . IR ANGIO VERTEBRAL SEL VERTEBRAL UNI R MOD SED  01/07/2018  . IR ANGIOGRAM FOLLOW UP STUDY  04/07/2017  . IR ANGIOGRAM FOLLOW UP STUDY  04/07/2017  . IR ANGIOGRAM FOLLOW UP STUDY  04/07/2017  . IR ANGIOGRAM FOLLOW UP STUDY  04/07/2017  . IR ANGIOGRAM FOLLOW UP STUDY  04/07/2017  . IR ANGIOGRAM SELECTIVE EACH ADDITIONAL VESSEL  04/07/2017  . IR TRANSCATH/EMBOLIZ  04/07/2017  . IR US GUIDE VASC ACCESS RIGHT  01/07/2018  . ORIF ANKLE FRACTURE Right 07/01/2018   Procedure: RIGHT ANKLE FRACTURE OPEN TREATMENT TRIMALLEOLAR INCLUDES INTERNAL FIXATION WITHOUT FIXATION OF POSTERIOR LIP, FRACTURE CLOSED TREATMENT TRIMALLEOLAR ANKLE WITH MANIPULATION, REPAIR SYNDESMOSIS, REPAIR DELTOID LIGAMENT;  Surgeon: Sheral Apley, MD;  Location: [facility];  Service: Orthopedics;  Laterality: Right;  . PARS PLANA VITRECTOMY Right 05/01/2017   Procedure: PARS PLANA VITRECTOMY WITH 25 GAUGE RIGHT EYE, endolaser photocoaglation;  Surgeon: Carmela Rima, MD;  Location: [name] OR;  Service: Ophthalmology;  Laterality: Right;  . RADIOLOGY WITH ANESTHESIA N/A 04/07/2017   Procedure: RADIOLOGY WITH ANESTHESIA;  Surgeon: Lisbeth Renshaw, MD;  Location: [facility] OR;  Service: Radiology;  Laterality: N/A;  . REPLACEMENT TOTAL KNEE BILATERAL  2004  . TEMPOROMANDIBULAR JOINT SURGERY     2 surgeries  . TUMOR REMOVAL    . VAGOTOMY  01/23/2012   Procedure: VAGOTOMY, antrectomy and BII;  Surgeon: Currie Paris, MD;  Location: [facility] OR;  Service: General;  Laterality: N/A;  Laparotomy with vagotomy.   Past Medical History:  Diagnosis Date  . Duodenal obstruction   . GERD (gastroesophageal reflux disease)   . Hypertension   . Stroke [facility])    april 2018, lt sided weakness  . Trimalleolar fracture of ankle, closed, right, initial encounter    Ht 5\' 4"  (1.626 m)   Wt 170 lb (77.1 kg)   BMI 29.18 kg/m  Opioid Risk Score:   Fall Risk Score:  `1  Depression screen PHQ 2/9  Depression screen [facility] 2/9 05/21/2018 05/21/2018 02/17/2018 01/13/2018 08/01/2017 07/11/2017 05/14/2017  Decreased Interest 0 0 2 1 0 2 3  Down, Depressed, Hopeless 0 0 1 0 0 1 2  PHQ - 2 Score 0 0 3 1 0 3 5  Altered sleeping 1 - 0 0 0 0 3  Tired, decreased energy 1 - 2 3 1 3 3   Change in appetite 1 - 0 0 0 0 1  Feeling bad or failure about yourself  0 - 0 0 0 0 1  Trouble concentrating 0 - 0 0 0 1 2  Moving slowly or fidgety/restless 0 - 0 0 0 3 2  Suicidal thoughts 0 - 0 0 0 1 0  PHQ-9 Score 3 - 5 4 1 11 17   Difficult doing work/chores Not difficult at all - Not difficult at all Somewhat difficult - - Very difficult  Some recent data might be hidden    Review of Systems  Constitutional: Negative.   HENT: Negative.   Eyes: Negative.   Respiratory: Negative.   Cardiovascular: Negative.   Gastrointestinal: Negative.   Endocrine: Negative.   Genitourinary: Positive for difficulty urinating.  Musculoskeletal: Positive for gait problem.  Skin: Negative.   Allergic/Immunologic: Negative.   Neurological: Positive for numbness.       Tingling  Psychiatric/Behavioral: Negative.   All other systems reviewed and are negative.      Objective:    Physical Exam  Patient has pain with internal rotation of the right hip with limitation of range of motion no pain with hip external rotation or with left hip internal or external rotation. No pain with knee range of motion on the right side There is mild tenderness over the right greater trochanter.  There is no tenderness over the low back area or the PSIS area. There is no pain with lumbar spine range of motion however age of motion is limited due to fear of falling. She ambulates with a wide-based support as well as rolling walker.      Assessment & Plan:  1.  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  We will continue gabapentin for her dysesthetic pain Continue outpatient PT OT 2.  Right groin pain we discussed that while her osteoarthritis is not new, it has become symptomatic likely due to her gait deviation.  She is also had a couple falls.  In addition her dysesthetic pain may be playing into this.  I am not sure whether she actually has symptomatic femoral nerve injury at this point. She will undergo orthopedic evaluation.  She is not interested in surgery however.  She may be a candidate for radiofrequency procedure for the right hip.  Would need to be referred to [facility]. She states the majority of her pain is at nighttime prescribed Tylenol 3 1 p.o. every 4 hours as needed #30 she will take this as needed if she gets good relief and will be on this for longer period of time, would need to come in for a controlled substance agreement as well as urine drug screen We discussed the complex etiology of her pain, we also discussed orthopedic evaluation as well as her medication management.  Also filled out a handicap placard form

## 2018-10-07 NOTE — Patient Instructions (Addendum)
Good relief with Fluoro guided intra articular injection June 2019, ~41mo relief 2 hr (anesthetic phase relief with repeat Right hip  Fluoro guided injection 08/12/18

## 2018-10-07 NOTE — Therapy (Signed)
[facility] [address] [place], [place], [address] Phone: [phone]   Fax:  [phone]  Physical Therapy Treatment  Patient Details  Name: [name] Barnett MRN: [id number] Date of Birth: 1976/05/24 Referring Provider (PT): Edmonia Lynch, MD   Encounter Date: 10/07/2018  PT End of Session - 10/07/18 2055    Visit Number  7    Number of Visits  17    Date for PT Re-Evaluation  11/08/18    Authorization Type  [employer]    PT Start Time  1318    PT Stop Time  1401    PT Time Calculation (min)  43 min    Activity Tolerance  Patient tolerated treatment well    Behavior During Therapy  [facility] for tasks assessed/performed       Past Medical History:  Diagnosis Date  . Duodenal obstruction   . GERD (gastroesophageal reflux disease)   . Hypertension   . Stroke [facility])    april 2018, lt sided weakness  . Trimalleolar fracture of ankle, closed, right, initial encounter     Past Surgical History:  Procedure Laterality Date  . BALLOON DILATION  12/31/2011   Procedure: BALLOON DILATION;  Surgeon: Missy Sabins, MD;  Location: [facility] ENDOSCOPY;  Service: Endoscopy;  Laterality: N/A;  . CHOLECYSTECTOMY  07/28/11  . ESOPHAGOGASTRODUODENOSCOPY  10/17/2011   Procedure: ESOPHAGOGASTRODUODENOSCOPY (EGD);  Surgeon: Missy Sabins, MD;  Location: [facility] ENDOSCOPY;  Service: Endoscopy;  Laterality: N/A;  . IR ANGIO INTRA EXTRACRAN SEL INTERNAL CAROTID BILAT MOD SED  04/07/2017  . IR ANGIO INTRA EXTRACRAN SEL INTERNAL CAROTID BILAT MOD SED  01/07/2018  . IR ANGIO VERTEBRAL SEL VERTEBRAL UNI R MOD SED  04/07/2017  . IR ANGIO VERTEBRAL SEL VERTEBRAL UNI R MOD SED  01/07/2018  . IR ANGIOGRAM FOLLOW UP STUDY  04/07/2017  . IR ANGIOGRAM FOLLOW UP STUDY  04/07/2017  . IR ANGIOGRAM FOLLOW UP STUDY  04/07/2017  . IR ANGIOGRAM FOLLOW UP STUDY  04/07/2017  . IR ANGIOGRAM FOLLOW UP STUDY  04/07/2017  . IR ANGIOGRAM SELECTIVE EACH ADDITIONAL VESSEL  04/07/2017  . IR  TRANSCATH/EMBOLIZ  04/07/2017  . IR US GUIDE VASC ACCESS RIGHT  01/07/2018  . ORIF ANKLE FRACTURE Right 07/01/2018   Procedure: RIGHT ANKLE FRACTURE OPEN TREATMENT TRIMALLEOLAR INCLUDES INTERNAL FIXATION WITHOUT FIXATION OF POSTERIOR LIP, FRACTURE CLOSED TREATMENT TRIMALLEOLAR ANKLE WITH MANIPULATION, REPAIR SYNDESMOSIS, REPAIR DELTOID LIGAMENT;  Surgeon: Renette Butters, MD;  Location: [facility];  Service: Orthopedics;  Laterality: Right;  . PARS PLANA VITRECTOMY Right 05/01/2017   Procedure: PARS PLANA VITRECTOMY WITH 25 GAUGE RIGHT EYE, endolaser photocoaglation;  Surgeon: Jalene Mullet, MD;  Location: [facility];  Service: Ophthalmology;  Laterality: Right;  . RADIOLOGY WITH ANESTHESIA N/A 04/07/2017   Procedure: RADIOLOGY WITH ANESTHESIA;  Surgeon: Consuella Lose, MD;  Location: [facility];  Service: Radiology;  Laterality: N/A;  . REPLACEMENT TOTAL KNEE BILATERAL  2004  . TEMPOROMANDIBULAR JOINT SURGERY     2 surgeries  . TUMOR REMOVAL    . VAGOTOMY  01/23/2012   Procedure: VAGOTOMY, antrectomy and BII;  Surgeon: Haywood Lasso, MD;  Location: [facility];  Service: General;  Laterality: N/A;  Laparotomy with vagotomy.    There were no vitals filed for this visit.  Subjective Assessment - 10/07/18 1324    Subjective  Pt reports having cold today with coughing.  No fever noted.     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  No/denies         [facility] PT Assessment - 10/07/18 1344      Transfers   Transfers  Sit to Stand;Stand to Sit    Sit to Stand  5: Supervision    Sit to Stand Details  Verbal cues for technique;Verbal cues for precautions/safety;Verbal cues for gait pattern    Sit to Stand Details  (indicate cue type and reason)  Pt continues to need max cues for forward trunk lean and weight shift with slower and more controlled motion.     Stand to Sit  5: Supervision    Stand to Sit Details (indicate cue type and reason)  Verbal cues for technique;Verbal cues for sequencing;Verbal cues for precautions/safety    Stand to Sit Details  Initially provided min A for improved forward trunk lean and maintaining forward weight shift for controlled descent with pt able to return demo at S level several times within session.       Ambulation/Gait   Ambulation/Gait  Yes    Ambulation/Gait Assistance  5: Supervision;4: Min guard    Ambulation/Gait Assistance Details  Pt able to ambulate x 230' with RW during session with only CGA intermittently (with turns) for safety.  Provided min cues for maintaining safe position to RW with improved hip extension and slower more controlled steps.     Ambulation Distance (Feet)  230 Feet    Assistive device  Rolling walker    Gait Pattern  Step-through pattern;Decreased stance time - left;Decreased stride length;Ataxic;Lateral hip instability;Wide base of support    Ambulation Surface  Level;Indoor      Standardized Balance Assessment   Standardized Balance Assessment  Timed Up and Go Test;Berg Balance Test      Berg Balance Test   Sit to Stand  Able to stand  independently using hands    Standing Unsupported  Able to stand safely 2 minutes    Sitting with Back Unsupported but Feet Supported on Floor or Stool  Able to sit safely and securely 2 minutes    Stand to Sit  Sits safely with minimal use of hands    Transfers  Able to transfer with verbal cueing and /or supervision    Standing Unsupported with Eyes Closed  Able to stand 10 seconds safely    Standing Ubsupported with Feet Together  Needs help to attain position but able to stand for 30 seconds with feet together    From Standing, Reach Forward with Outstretched Arm  Can reach confidently >25 cm (10")     From Standing Position, Pick up Object from Floor  Able to pick up shoe, needs supervision    From Standing Position, Turn to Look Behind Over each Shoulder  Looks behind one side only/other side shows less weight shift    Turn 360 Degrees  Needs assistance while turning    Standing Unsupported, Alternately Place Feet on Step/Stool  Needs assistance to keep from falling or unable to try    Standing Unsupported, One Foot in Front  Needs help to step but can hold 15 seconds    Standing on One Leg  Unable to try or needs assist to prevent fall    Total Score  33    Berg comment:  < 36 high risk for falls (close to 100%)      Timed Up and Go Test   TUG  Normal TUG    Normal TUG (seconds)  27.13   second rep 18.78 secs with RW                          PT Education - 10/07/18 2054    Education provided  Yes    Education Details  Education on importance of slower more controlled movements,  provided pt/spouse with scheduled land OT appt in November.     Person(s) Educated  Patient;Spouse    Methods  Explanation    Comprehension  Verbalized understanding       PT Short Term Goals - 10/07/18 1325      PT SHORT TERM GOAL #1   Title  Pt will report compliancy with performing initial HEP to further improve independence with functional mobility     Baseline  met per report    Time  4    Period  Weeks    Status  Achieved      PT SHORT TERM GOAL #2   Title  Pt will improve gait speed to >/= 2.56 ft/esc indicative of community ambulation with LRAD     Baseline  10/1: 1.96 ft/sec with RW and CGA for safety considerations to 2.13 ft/sec (still with safety considerations) on 10/07/18    Time  4    Period  Weeks    Status  Partially Met      PT SHORT TERM GOAL #3   Title  Pt will improve Berg score to >/= 30/56 indicating decreased fall risk potential     Baseline  10/1: 25/56 indicative of high risk for falls, 33/56 on 10/07/18    Time  4    Period  Weeks    Status   Achieved      PT SHORT TERM GOAL #4   Title  Pt will increased TUG score by >/= 3 seconds indicating decreased risk for falls and improvement in functional mobility using LRAD    Baseline  27 secs with RW first rep, 18.78 secs with RW second RW on 10/07/17    Time  4    Period  Weeks    Status  Achieved      PT SHORT TERM GOAL #5   Title  Pt will ambulate 230 feet on level surfaces with LRAD with therapist providing CGA to improve functional mobility     Baseline  met 10/07/18 230' with RW at S level with cues for safety    Time  4    Period  Weeks    Status  Achieved      PT SHORT TERM GOAL #6   Title  Patient will perform 12 steps with alternating stepping pattern using B HR's to improve community accessibility with therapist providing min A     Time  4    Period  Weeks    Status  New        PT Long Term Goals - 09/09/18 1603      PT LONG TERM GOAL #1   Title  Pt will report compliancy and independence with HEP to further improve functional mobility     Time  8    Period  Weeks    Status  New    Target Date  11/08/18  PT LONG TERM GOAL #2   Title  Pt will improve Berg score to >/= 36/56 to decrease fall risk potential     Time  12    Period  Weeks    Status  New    Target Date  11/08/18      PT LONG TERM GOAL #3   Title  Pt will improve gait speed to >/=3.16 ft/sec indicating significant change in functional mobility with LRAD     Time  8    Period  Weeks    Status  New    Target Date  11/08/18      PT LONG TERM GOAL #4   Title  Pt will improve TUG score to </= 13.5 seconds using LRAD indicating decreased fall risk potential     Time  8    Period  Weeks    Status  New    Target Date  11/08/18      PT LONG TERM GOAL #5   Title  Pt will negotiate 12 steps using B HR's and alternating stepping technique with supervision to improve community accessibility     Time  8    Period  Weeks    Status  New    Target Date  11/08/18      PT LONG TERM GOAL #6    Title  Pt will report ability to perform household ambulation using her RW with modified independence demonstrating significant improvement in functional mobility     Baseline  Currently reports using her w/c at home 2/2 to fear of falling with RW    Time  8    Period  Weeks    Status  New    Target Date  11/08/18      PT LONG TERM GOAL #7   Title  Pt will ambulate outdoors over paved surfaces and perform curbs and inclines with supervision using LRAD for 300 feet to improve functional mobility with community distances     Time  8    Period  Weeks    Status  New    Target Date  11/08/18      PT LONG TERM GOAL #8   Title  Pt will ambulate over level surfaces with her RW for 230 feet with supervision using LRAD demonstrating improvement in functional mobility     Time  8    Period  Weeks    Status  New    Target Date  11/08/18            Plan - 10/07/18 2055    Clinical Impression Statement  Skilled session focused on assessment of STG's.  Note that pt has met 4/6 STGs, partially meeting gait speed goal (improvement but not to goal level).  Did not have time to check stair goal, therefore will check at next visit.  Pt making progress towards remaining LTGs.      Rehab Potential  Good    Clinical Impairments Affecting Rehab Potential  high risk for falls due to abnormal gait, ataxia, sensory changes, and impaired cognition.     PT Frequency  2x / week    PT Duration  8 weeks    PT Treatment/Interventions  ADLs/Self Care Home Management;Aquatic Therapy;DME Instruction;Gait training;Stair training;Functional mobility training;Therapeutic activities;Therapeutic exercise;Balance training;Neuromuscular re-education;Patient/family education;Cognitive remediation;Orthotic Fit/Training;Electrical Stimulation;Cryotherapy;Moist Heat;Manual techniques;Passive range of motion;Taping;Energy conservation    PT Next Visit Plan  Check remaining STG, slow down movements and focus on control of  movement (sit <> stand, gait,  weight shifting, stairs).  continue with LE strengthening and standing balance activities, trunk control in tall kneeling; aquatic therapy to be scheduled - depending on availiability    PT Home Exercise Plan  Access Code: [email or web address]     Consulted and Agree with Plan of Care  Patient       Patient will benefit from skilled therapeutic intervention in order to improve the following deficits and impairments:  Abnormal gait, Decreased balance, Decreased cognition, Decreased coordination, Decreased strength, Difficulty walking, Impaired sensation, Pain, Decreased activity tolerance, Decreased mobility, Decreased endurance, Postural dysfunction, Decreased knowledge of use of DME, Impaired perceived functional ability  Visit Diagnosis: 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 Osteoarthritis of right hip 10/07/2018  . 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 Weakness with dizziness-  since [facility] and CVA 04/07/17 08/07/2017  . Disturbances of vision, late effect of stroke 08/06/2017  . Health education/counseling 08/05/2017  . High risk medications (not anticoagulants) long-term use 08/05/2017  . Neuritis-  R sided:  arm and leg/ body due to stroke 08/05/2017  . Elevated LDL cholesterol level 07/11/2017  . [employer] (NIH) Stroke Scale limb ataxia score 2, ataxia present in two limbs 06/25/2017  . 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 Elevated vitamin B12 level 05/30/2017  . Vitamin D deficiency 05/29/2017  . History of tobacco abuse-  30pk yr hx - quit 04/07/17 05/21/2017  . Gait disturbance, post-stroke 05/14/2017  . Benign essential HTN   . Vitreous hemorrhage of right eye ([place])    . Adjustment disorder with mixed anxiety and depressed mood   . 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 s/p SAH (subarachnoid hemorrhage) ([place]) 04/19/2017  . 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 Hypoxia   . Subarachnoid hemorrhage due to ruptured aneurysm ([place]) 04/07/2017  . CVA (cerebral vascular accident) ([place]) 04/07/2017  . Elevated gastrin level 01/25/2012  . Hypokalemia 01/21/2012  . Nausea & vomiting 01/20/2012  . Epigastric pain 01/20/2012  . Duodenal ulcer, acute with obstruction 10/17/2011  . S/P laparoscopic cholecystectomy 10/16/2011    Cameron Sprang, PT, MPT [facility] [address] [place], [place], [address] Phone: [phone]   Fax:  [phone] 10/07/18, 9:00 PM  Name: [name] Barnett MRN: [id number] Date of Birth: [birth year]

## 2018-10-09 ENCOUNTER — Encounter: Payer: Self-pay | Admitting: Physical Therapy

## 2018-10-09 ENCOUNTER — Ambulatory Visit: Payer: BLUE CROSS/BLUE SHIELD | Admitting: Physical Therapy

## 2018-10-09 DIAGNOSIS — R2689 Other abnormalities of gait and mobility: Secondary | ICD-10-CM

## 2018-10-09 DIAGNOSIS — R2681 Unsteadiness on feet: Secondary | ICD-10-CM

## 2018-10-09 DIAGNOSIS — R278 Other lack of coordination: Secondary | ICD-10-CM

## 2018-10-09 NOTE — Therapy (Signed)
[facility] [address] [place], [place], [address] Phone: [phone]   Fax:  [phone]  Physical Therapy Treatment  Patient Details  Name: [name] Barnett MRN: [id number] Date of Birth: 10-03-1976 Referring Provider (PT): Edmonia Lynch, MD   Encounter Date: 10/09/2018  PT End of Session - 10/09/18 2103    Visit Number  8    Number of Visits  17    Date for PT Re-Evaluation  11/08/18    Authorization Type  BCBS    PT Start Time  1532    PT Stop Time  3524    PT Time Calculation (min)  45 min       Past Medical History:  Diagnosis Date  . Duodenal obstruction   . GERD (gastroesophageal reflux disease)   . Hypertension   . Stroke [facility])    april 2018, lt sided weakness  . Trimalleolar fracture of ankle, closed, right, initial encounter     Past Surgical History:  Procedure Laterality Date  . BALLOON DILATION  12/31/2011   Procedure: BALLOON DILATION;  Surgeon: Missy Sabins, MD;  Location: [facility] ENDOSCOPY;  Service: Endoscopy;  Laterality: N/A;  . CHOLECYSTECTOMY  07/28/11  . ESOPHAGOGASTRODUODENOSCOPY  10/17/2011   Procedure: ESOPHAGOGASTRODUODENOSCOPY (EGD);  Surgeon: Missy Sabins, MD;  Location: [facility] ENDOSCOPY;  Service: Endoscopy;  Laterality: N/A;  . IR ANGIO INTRA EXTRACRAN SEL INTERNAL CAROTID BILAT MOD SED  04/07/2017  . IR ANGIO INTRA EXTRACRAN SEL INTERNAL CAROTID BILAT MOD SED  01/07/2018  . IR ANGIO VERTEBRAL SEL VERTEBRAL UNI R MOD SED  04/07/2017  . IR ANGIO VERTEBRAL SEL VERTEBRAL UNI R MOD SED  01/07/2018  . IR ANGIOGRAM FOLLOW UP STUDY  04/07/2017  . IR ANGIOGRAM FOLLOW UP STUDY  04/07/2017  . IR ANGIOGRAM FOLLOW UP STUDY  04/07/2017  . IR ANGIOGRAM FOLLOW UP STUDY  04/07/2017  . IR ANGIOGRAM FOLLOW UP STUDY  04/07/2017  . IR ANGIOGRAM SELECTIVE EACH ADDITIONAL VESSEL  04/07/2017  . IR TRANSCATH/EMBOLIZ  04/07/2017  . IR US GUIDE VASC ACCESS RIGHT  01/07/2018  . ORIF ANKLE FRACTURE Right 07/01/2018   Procedure: RIGHT ANKLE FRACTURE OPEN TREATMENT TRIMALLEOLAR INCLUDES INTERNAL FIXATION WITHOUT FIXATION OF POSTERIOR LIP, FRACTURE CLOSED TREATMENT TRIMALLEOLAR ANKLE WITH MANIPULATION, REPAIR SYNDESMOSIS, REPAIR DELTOID LIGAMENT;  Surgeon: Renette Butters, MD;  Location: [facility];  Service: Orthopedics;  Laterality: Right;  . PARS PLANA VITRECTOMY Right 05/01/2017   Procedure: PARS PLANA VITRECTOMY WITH 25 GAUGE RIGHT EYE, endolaser photocoaglation;  Surgeon: Jalene Mullet, MD;  Location: [facility];  Service: Ophthalmology;  Laterality: Right;  . RADIOLOGY WITH ANESTHESIA N/A 04/07/2017   Procedure: RADIOLOGY WITH ANESTHESIA;  Surgeon: Consuella Lose, MD;  Location: [facility];  Service: Radiology;  Laterality: N/A;  . REPLACEMENT TOTAL KNEE BILATERAL  2004  . TEMPOROMANDIBULAR JOINT SURGERY     2 surgeries  . TUMOR REMOVAL    . VAGOTOMY  01/23/2012   Procedure: VAGOTOMY, antrectomy and BII;  Surgeon: Haywood Lasso, MD;  Location: [facility];  Service: General;  Laterality: N/A;  Laparotomy with vagotomy.    There were no vitals filed for this visit.  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  Yes    Pain Score  8     Pain Location  Hip    Pain Orientation  Right    Pain Descriptors / Indicators  Aching    Pain Type  Chronic pain    Pain Onset  More than a month ago    Pain Frequency  Constant                       [facility] Adult PT Treatment/Exercise - 10/09/18 1550      Transfers   Transfers  Sit to Stand    Sit to Stand  5: Supervision    Sit to Stand Details  Verbal cues for technique;Verbal cues  for precautions/safety;Verbal cues for gait pattern    Sit to Stand Details (indicate cue type and reason)  from mat to RW    Stand to Sit  5: Supervision    Number of Reps  Other reps (comment)   2     Ambulation/Gait   Ambulation/Gait  Yes    Ambulation/Gait Assistance  4: Min guard    Ambulation/Gait Assistance Details  tactile cues for pelvic stabilization to decrease rotation    Ambulation Distance (Feet)  350 Feet    Assistive device  Rolling walker    Gait Pattern  Step-through pattern;Decreased stance time - left;Decreased stride length;Ataxic;Lateral hip instability;Wide base of support    Ambulation Surface  Level;Indoor    Stairs  Yes    Stairs Assistance  5: Supervision    Stair Management Technique  Two rails;Alternating pattern;Forwards    Number of Stairs  12    Height of Stairs  6      Neuro Re-ed    Neuro Re-ed Details   Pt performed tall kneeling on mat - pt moved LLE forward/back and out/in 5 reps each direction with min assist for balance; partial squats back onto heels 5 reps for hip strengthening ; tall kneeling position - head turns horizontal and vertical 5 reps each direction with min assist for balance; alternate UE flexion 5 reps each for trunk stabilization       Exercises   Exercises  Lumbar;Knee/Hip      Knee/Hip Exercises: Supine   Heel Slides  AROM;Right;5 sets   with contract relax -flexing Rt hip flexors    Straight Leg Raises  AROM;Right;1 set;10 reps    Other Supine Knee/Hip Exercises  Rt hip flexion with clockwise circles and counterclockwise circles 5 reps each direction       Knee/Hip Exercises: Prone   Hip Extension  AAROM;Right;1 set;10 reps   with knee flexed at 90 degrees    Straight Leg Raises  AROM;Right;1 set;10 reps      NeuroRe-ed; standing balance activity to improve coordination of LLE - bil. UE support on RW - pt performed stepping  forward/back with target for forward step and then back to bilateral stance 10 reps; then  performed stepping laterally to left side with LLE And then back to bilateral stance position with cues to perform slowly and with control to reduce ataxia in LLE          PT Short Term Goals - 10/09/18 1607      PT SHORT TERM GOAL #1   Title  Pt will report compliancy with performing initial HEP to further improve independence with functional mobility  Status  Achieved      PT SHORT TERM GOAL #2   Title  Pt will improve gait speed to >/= 2.56 ft/esc indicative of community ambulation with LRAD     Baseline  10/1: 1.96 ft/sec with RW and CGA for safety considerations to 2.13 ft/sec (still with safety considerations) on 10/07/18    Status  Partially Met      PT SHORT TERM GOAL #3   Title  Pt will improve Berg score to >/= 30/56 indicating decreased fall risk potential     Baseline  10/1: 25/56 indicative of high risk for falls, 33/56 on 10/07/18    Status  Achieved      PT SHORT TERM GOAL #4   Title  Pt will increased TUG score by >/= 3 seconds indicating decreased risk for falls and improvement in functional mobility using LRAD    Baseline  27 secs with RW first rep, 18.78 secs with RW second RW on 10/07/17    Status  Achieved      PT SHORT TERM GOAL #5   Title  Pt will ambulate 230 feet on level surfaces with LRAD with therapist providing CGA to improve functional mobility     Baseline  met 10/07/18 230' with RW at S level with cues for safety    Status  Achieved      PT SHORT TERM GOAL #6   Title  Patient will perform 12 steps with alternating stepping pattern using B HR's to improve community accessibility with therapist providing min A     Status  Achieved        PT Long Term Goals - 10/09/18 2104      PT LONG TERM GOAL #1   Title  Pt will report compliancy and independence with HEP to further improve functional mobility       PT LONG TERM GOAL #2   Title  Pt will improve Berg score to >/= 36/56 to decrease fall risk potential       PT LONG TERM GOAL #3    Title  Pt will improve gait speed to >/=3.16 ft/sec indicating significant change in functional mobility with LRAD       PT LONG TERM GOAL #4   Title  Pt will improve TUG score to </= 13.5 seconds using LRAD indicating decreased fall risk potential       PT LONG TERM GOAL #6   Title  Pt will report ability to perform household ambulation using her RW with modified independence demonstrating significant improvement in functional mobility       PT LONG TERM GOAL #8   Title  Pt will ambulate over level surfaces with her RW for 230 feet with supervision using LRAD demonstrating improvement in functional mobility               Patient will benefit from skilled therapeutic intervention in order to improve the following deficits and impairments:     Visit Diagnosis: Other abnormalities of gait and mobility  Unsteadiness on feet  Other lack of coordination     Problem List Patient Active Problem List   Diagnosis Date Noted  . Osteoarthritis of right hip 10/07/2018  . 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 Weakness with dizziness-  since [facility] and CVA 04/07/17 08/07/2017  . Disturbances of vision, late effect of stroke 08/06/2017  . Health education/counseling 08/05/2017  . High risk medications (not anticoagulants) long-term use 08/05/2017  . Neuritis-  R sided:  arm and leg/  body due to stroke 08/05/2017  . Elevated LDL cholesterol level 07/11/2017  . [employer] (NIH) Stroke Scale limb ataxia score 2, ataxia present in two limbs 06/25/2017  . 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 Elevated vitamin B12 level 05/30/2017  . Vitamin D deficiency 05/29/2017  . History of tobacco abuse-  30pk yr hx - quit 04/07/17 05/21/2017  . Gait disturbance, post-stroke 05/14/2017  . Benign essential HTN   . Vitreous hemorrhage of right eye ([place])   . Adjustment disorder with mixed anxiety and depressed mood   . 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 s/p SAH (subarachnoid hemorrhage) ([place]) 04/19/2017  . 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 Hypoxia   . Subarachnoid hemorrhage due to ruptured aneurysm ([place]) 04/07/2017  . CVA (cerebral vascular accident) ([place]) 04/07/2017  . Elevated gastrin level 01/25/2012  . Hypokalemia 01/21/2012  . Nausea & vomiting 01/20/2012  . Epigastric pain 01/20/2012  . Duodenal ulcer, acute with obstruction 10/17/2011  . S/P laparoscopic cholecystectomy 10/16/2011    [name] Barnett, PT 10/09/2018, 9:09 PM  [facility] [address] [place], [place], [address] Phone: [phone]   Fax:  [phone]  Name: FREDERIKA HUKILL MRN: [id number] Date of Birth: 28-Jul-1976

## 2018-10-15 ENCOUNTER — Ambulatory Visit: Payer: BLUE CROSS/BLUE SHIELD | Admitting: Physical Therapy

## 2018-10-17 ENCOUNTER — Ambulatory Visit: Payer: BLUE CROSS/BLUE SHIELD | Attending: Orthopedic Surgery | Admitting: Physical Therapy

## 2018-10-17 DIAGNOSIS — M79651 Pain in right thigh: Secondary | ICD-10-CM | POA: Insufficient documentation

## 2018-10-17 DIAGNOSIS — R2689 Other abnormalities of gait and mobility: Secondary | ICD-10-CM | POA: Insufficient documentation

## 2018-10-17 DIAGNOSIS — I69054 Hemiplegia and hemiparesis following nontraumatic subarachnoid hemorrhage affecting left non-dominant side: Secondary | ICD-10-CM | POA: Insufficient documentation

## 2018-10-17 DIAGNOSIS — R260 Ataxic gait: Secondary | ICD-10-CM | POA: Insufficient documentation

## 2018-10-17 DIAGNOSIS — R293 Abnormal posture: Secondary | ICD-10-CM | POA: Insufficient documentation

## 2018-10-17 DIAGNOSIS — R278 Other lack of coordination: Secondary | ICD-10-CM | POA: Insufficient documentation

## 2018-10-17 DIAGNOSIS — R270 Ataxia, unspecified: Secondary | ICD-10-CM | POA: Insufficient documentation

## 2018-10-17 DIAGNOSIS — R208 Other disturbances of skin sensation: Secondary | ICD-10-CM | POA: Diagnosis present

## 2018-10-17 DIAGNOSIS — R2681 Unsteadiness on feet: Secondary | ICD-10-CM | POA: Diagnosis present

## 2018-10-17 NOTE — Therapy (Signed)
[facility] [address] [place], [place], [address] Phone: [phone]   Fax:  [phone]  Physical Therapy Treatment  Patient Details  Name: [name] Barnett MRN: [id number] Date of Birth: [birth year] Referring Provider (PT): Edmonia Lynch, MD   Encounter Date: 10/17/2018  PT End of Session - 10/17/18 1545    Visit Number  9    Number of Visits  17    Date for PT Re-Evaluation  11/08/18    Authorization Type  BCBS    PT Start Time  1446    PT Stop Time  1530    PT Time Calculation (min)  44 min    Equipment Utilized During Treatment  Gait belt    Activity Tolerance  Patient tolerated treatment well    Behavior During Therapy  [facility] for tasks assessed/performed       Past Medical History:  Diagnosis Date  . Duodenal obstruction   . GERD (gastroesophageal reflux disease)   . Hypertension   . Stroke [facility])    april 2018, lt sided weakness  . Trimalleolar fracture of ankle, closed, right, initial encounter     Past Surgical History:  Procedure Laterality Date  . BALLOON DILATION  12/31/2011   Procedure: BALLOON DILATION;  Surgeon: Missy Sabins, MD;  Location: [facility] ENDOSCOPY;  Service: Endoscopy;  Laterality: N/A;  . CHOLECYSTECTOMY  07/28/11  . ESOPHAGOGASTRODUODENOSCOPY  10/17/2011   Procedure: ESOPHAGOGASTRODUODENOSCOPY (EGD);  Surgeon: Missy Sabins, MD;  Location: [facility] ENDOSCOPY;  Service: Endoscopy;  Laterality: N/A;  . IR ANGIO INTRA EXTRACRAN SEL INTERNAL CAROTID BILAT MOD SED  04/07/2017  . IR ANGIO INTRA EXTRACRAN SEL INTERNAL CAROTID BILAT MOD SED  01/07/2018  . IR ANGIO VERTEBRAL SEL VERTEBRAL UNI R MOD SED  04/07/2017  . IR ANGIO VERTEBRAL SEL VERTEBRAL UNI R MOD SED  01/07/2018  . IR ANGIOGRAM FOLLOW UP STUDY  04/07/2017  . IR ANGIOGRAM FOLLOW UP STUDY  04/07/2017  . IR ANGIOGRAM FOLLOW UP STUDY  04/07/2017  . IR ANGIOGRAM FOLLOW UP STUDY  04/07/2017  . IR ANGIOGRAM FOLLOW UP STUDY  04/07/2017  . IR ANGIOGRAM  SELECTIVE EACH ADDITIONAL VESSEL  04/07/2017  . IR TRANSCATH/EMBOLIZ  04/07/2017  . IR US GUIDE VASC ACCESS RIGHT  01/07/2018  . ORIF ANKLE FRACTURE Right 07/01/2018   Procedure: RIGHT ANKLE FRACTURE OPEN TREATMENT TRIMALLEOLAR INCLUDES INTERNAL FIXATION WITHOUT FIXATION OF POSTERIOR LIP, FRACTURE CLOSED TREATMENT TRIMALLEOLAR ANKLE WITH MANIPULATION, REPAIR SYNDESMOSIS, REPAIR DELTOID LIGAMENT;  Surgeon: Renette Butters, MD;  Location: [place];  Service: Orthopedics;  Laterality: Right;  . PARS PLANA VITRECTOMY Right 05/01/2017   Procedure: PARS PLANA VITRECTOMY WITH 25 GAUGE RIGHT EYE, endolaser photocoaglation;  Surgeon: Jalene Mullet, MD;  Location: [facility];  Service: Ophthalmology;  Laterality: Right;  . RADIOLOGY WITH ANESTHESIA N/A 04/07/2017   Procedure: RADIOLOGY WITH ANESTHESIA;  Surgeon: Consuella Lose, MD;  Location: [facility];  Service: Radiology;  Laterality: N/A;  . REPLACEMENT TOTAL KNEE BILATERAL  2004  . TEMPOROMANDIBULAR JOINT SURGERY     2 surgeries  . TUMOR REMOVAL    . VAGOTOMY  01/23/2012   Procedure: VAGOTOMY, antrectomy and BII;  Surgeon: Haywood Lasso, MD;  Location: [facility];  Service: General;  Laterality: N/A;  Laparotomy with vagotomy.    There were no vitals filed for this visit.  Subjective Assessment - 10/17/18 1550    Subjective  Asking Dr Percell Miller to schedule an MRI. SHe has had no falls.  Patient is accompained by:  Family member    Pertinent History  Ataxia post stroke (April 2018), Duodenal obstruction, HTN, GERD    Limitations  Lifting;Standing;Walking;House hold activities    Currently in Pain?  Yes    Pain Score  5     Pain Location  Groin    Pain Orientation  Right    Pain Descriptors / Indicators  Aching    Pain Type  Chronic pain    Pain Onset  More than a month ago    Aggravating Factors   weight bearing, exercises    Pain Relieving Factors  stretching, rest    Pain Onset  More than a month ago                        [facility] Adult PT Treatment/Exercise - 10/17/18 1534      Transfers   Transfers  Sit to Stand    Sit to Stand  5: Supervision    Sit to Stand Details  Verbal cues for technique;Verbal cues for precautions/safety;Verbal cues to slow down   Sit to Stand Details (indicate cue type and reason)  from mat to RW>   Stand to Sit  5: Supervision with UE assist needed.   Floor to Transfer  4: Min assist    Floor to Transfer Details (indicate cue type and reason)  min assist for balance and approximation for R LE to facilitate knee extension to rise to matt table.    Number of Reps  multiple reps of sit to stand, 1 rep with floor transfer     Neuro Re-ed    Neuro Re-ed Details   Patient performed bridges x10 requiring intermittent min assist for stability at hips/knees; Patient performed tall kneeling lateral/fw/backward walking requiring min assist for balance x2 reps each; patient performed quadruped exercises progressing from single extremity to UE/LE combined. VCs for core stabilization were given for all exercises; VCs and demonstration given to perform exercises correctly. Tactile cues given for core stabilization.      Knee/Hip Exercises: Supine   Straight Leg Raises  AROM;Right;1 set;10 reps    Other Supine Knee/Hip Exercises  Rt hip flexion with clockwise circles and counterclockwise circles 5 reps each direction                PT Short Term Goals - 10/09/18 1607      PT SHORT TERM GOAL #1   Title  Pt will report compliancy with performing initial HEP to further improve independence with functional mobility     Status  Achieved      PT SHORT TERM GOAL #2   Title  Pt will improve gait speed to >/= 2.56 ft/esc indicative of community ambulation with LRAD     Baseline  10/1: 1.96 ft/sec with RW and CGA for safety considerations to 2.13 ft/sec (still with safety considerations) on 10/07/18    Status  Partially Met      PT SHORT TERM GOAL #3   Title   Pt will improve Berg score to >/= 30/56 indicating decreased fall risk potential     Baseline  10/1: 25/56 indicative of high risk for falls, 33/56 on 10/07/18    Status  Achieved      PT SHORT TERM GOAL #4   Title  Pt will increased TUG score by >/= 3 seconds indicating decreased risk for falls and improvement in functional mobility using LRAD    Baseline  27 secs with  RW first rep, 18.78 secs with RW second RW on 10/07/17    Status  Achieved      PT SHORT TERM GOAL #5   Title  Pt will ambulate 230 feet on level surfaces with LRAD with therapist providing CGA to improve functional mobility     Baseline  met 10/07/18 230' with RW at S level with cues for safety    Status  Achieved      PT SHORT TERM GOAL #6   Title  Patient will perform 12 steps with alternating stepping pattern using B HR's to improve community accessibility with therapist providing min A     Status  Achieved        PT Long Term Goals - 10/09/18 2104      PT LONG TERM GOAL #1   Title  Pt will report compliancy and independence with HEP to further improve functional mobility       PT LONG TERM GOAL #2   Title  Pt will improve Berg score to >/= 36/56 to decrease fall risk potential       PT LONG TERM GOAL #3   Title  Pt will improve gait speed to >/=3.16 ft/sec indicating significant change in functional mobility with LRAD       PT LONG TERM GOAL #4   Title  Pt will improve TUG score to </= 13.5 seconds using LRAD indicating decreased fall risk potential       PT LONG TERM GOAL #6   Title  Pt will report ability to perform household ambulation using her RW with modified independence demonstrating significant improvement in functional mobility       PT LONG TERM GOAL #8   Title  Pt will ambulate over level surfaces with her RW for 230 feet with supervision using LRAD demonstrating improvement in functional mobility             Plan - 10/17/18 1545    Clinical Impression Statement  Treatment today was  focused on balance and core stabilization exercises using matt table exercises. She is limited due to pain and ataxia. Patient would beenfit from further therapy to increase strength and promote coordinated movement.    Clinical Presentation  Evolving    Clinical Decision Making  Moderate    Rehab Potential  Good    Clinical Impairments Affecting Rehab Potential  high risk for falls due to abnormal gait, ataxia, sensory changes, and impaired cognition.     PT Frequency  2x / week    PT Duration  8 weeks    PT Treatment/Interventions  ADLs/Self Care Home Management;Aquatic Therapy;DME Instruction;Gait training;Stair training;Functional mobility training;Therapeutic activities;Therapeutic exercise;Balance training;Neuromuscular re-education;Patient/family education;Cognitive remediation;Orthotic Fit/Training;Electrical Stimulation;Cryotherapy;Moist Heat;Manual techniques;Passive range of motion;Taping;Energy conservation    PT Next Visit Plan  Check remaining STG, slow down movements and focus on control of movement (sit <> stand, gait, weight shifting, stairs).  continue with LE strengthening and standing balance activities, trunk control in tall kneeling; aquatic therapy to be scheduled - depending on availiability    PT Home Exercise Plan  Access Code: [email or web address]     Consulted and Agree with Plan of Care  Patient;Family member/caregiver    Family Member Consulted  Mother       Patient will benefit from skilled therapeutic intervention in order to improve the following deficits and impairments:  Abnormal gait, Decreased balance, Decreased cognition, Decreased coordination, Decreased strength, Difficulty walking, Impaired sensation, Pain, Decreased activity tolerance, Decreased mobility, Decreased endurance, Postural dysfunction, Decreased  knowledge of use of DME, Impaired perceived functional ability  Visit Diagnosis: Other lack of coordination  Ataxia     Problem List Patient Active Problem  List   Diagnosis Date Noted  . Osteoarthritis of right hip 10/07/2018  . 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 Weakness with dizziness-  since [facility] and CVA 04/07/17 08/07/2017  . Disturbances of vision, late effect of stroke 08/06/2017  . Health education/counseling 08/05/2017  . High risk medications (not anticoagulants) long-term use 08/05/2017  . Neuritis-  R sided:  arm and leg/ body due to stroke 08/05/2017  . Elevated LDL cholesterol level 07/11/2017  . [employer] (NIH) Stroke Scale limb ataxia score 2, ataxia present in two limbs 06/25/2017  . 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 Elevated vitamin B12 level 05/30/2017  . Vitamin D deficiency 05/29/2017  . History of tobacco abuse-  30pk yr hx - quit 04/07/17 05/21/2017  . Gait disturbance, post-stroke 05/14/2017  . Benign essential HTN   . Vitreous hemorrhage of right eye ([place])   . Adjustment disorder with mixed anxiety and depressed mood   . 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 s/p SAH (subarachnoid hemorrhage) ([place]) 04/19/2017  . 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 Hypoxia   . Subarachnoid hemorrhage due to ruptured aneurysm ([place]) 04/07/2017  . CVA (cerebral vascular accident) ([place]) 04/07/2017  . Elevated gastrin level 01/25/2012  . Hypokalemia 01/21/2012  . Nausea & vomiting 01/20/2012  . Epigastric pain 01/20/2012  . Duodenal ulcer, acute with obstruction 10/17/2011  . S/P laparoscopic cholecystectomy 10/16/2011    Donnald Garre SPTA 10/17/2018, 3:52 PM  [facility] [address] [place], [place], [address] Phone: [phone]   Fax:  [phone]  Name: [name] Barnett MRN: [id number] Date of Birth: 1976/11/16

## 2018-10-20 ENCOUNTER — Ambulatory Visit: Payer: BLUE CROSS/BLUE SHIELD | Admitting: Physical Therapy

## 2018-10-20 ENCOUNTER — Encounter: Payer: Self-pay | Admitting: Physical Therapy

## 2018-10-20 DIAGNOSIS — R2689 Other abnormalities of gait and mobility: Secondary | ICD-10-CM

## 2018-10-20 DIAGNOSIS — R278 Other lack of coordination: Secondary | ICD-10-CM

## 2018-10-20 DIAGNOSIS — M79651 Pain in right thigh: Secondary | ICD-10-CM

## 2018-10-20 DIAGNOSIS — R260 Ataxic gait: Secondary | ICD-10-CM

## 2018-10-20 DIAGNOSIS — R2681 Unsteadiness on feet: Secondary | ICD-10-CM

## 2018-10-20 NOTE — Therapy (Signed)
[facility] [address] [place], [place], [address] Phone: [phone]   Fax:  [phone]  Physical Therapy Treatment  Patient Details  Name: [name] Barnett MRN: [id number] Date of Birth: 1976-03-01 Referring Provider (PT): Edmonia Lynch, MD   Encounter Date: 10/20/2018  PT End of Session - 10/20/18 1650    Visit Number  10    Number of Visits  17    Date for PT Re-Evaluation  11/08/18    Authorization Type  [employer]    PT Start Time  1530    PT Stop Time  1615    PT Time Calculation (min)  45 min    Activity Tolerance  Patient tolerated treatment well    Behavior During Therapy  [facility] for tasks assessed/performed       Past Medical History:  Diagnosis Date  . Duodenal obstruction   . GERD (gastroesophageal reflux disease)   . Hypertension   . Stroke [facility])    april 2018, lt sided weakness  . Trimalleolar fracture of ankle, closed, right, initial encounter     Past Surgical History:  Procedure Laterality Date  . BALLOON DILATION  12/31/2011   Procedure: BALLOON DILATION;  Surgeon: Missy Sabins, MD;  Location: [facility] ENDOSCOPY;  Service: Endoscopy;  Laterality: N/A;  . CHOLECYSTECTOMY  07/28/11  . ESOPHAGOGASTRODUODENOSCOPY  10/17/2011   Procedure: ESOPHAGOGASTRODUODENOSCOPY (EGD);  Surgeon: Missy Sabins, MD;  Location: [name] ENDOSCOPY;  Service: Endoscopy;  Laterality: N/A;  . IR ANGIO INTRA EXTRACRAN SEL INTERNAL CAROTID BILAT MOD SED  04/07/2017  . IR ANGIO INTRA EXTRACRAN SEL INTERNAL CAROTID BILAT MOD SED  01/07/2018  . IR ANGIO VERTEBRAL SEL VERTEBRAL UNI R MOD SED  04/07/2017  . IR ANGIO VERTEBRAL SEL VERTEBRAL UNI R MOD SED  01/07/2018  . IR ANGIOGRAM FOLLOW UP STUDY  04/07/2017  . IR ANGIOGRAM FOLLOW UP STUDY  04/07/2017  . IR ANGIOGRAM FOLLOW UP STUDY  04/07/2017  . IR ANGIOGRAM FOLLOW UP STUDY  04/07/2017  . IR ANGIOGRAM FOLLOW UP STUDY  04/07/2017  . IR ANGIOGRAM SELECTIVE EACH ADDITIONAL VESSEL  04/07/2017  . IR  TRANSCATH/EMBOLIZ  04/07/2017  . IR US GUIDE VASC ACCESS RIGHT  01/07/2018  . ORIF ANKLE FRACTURE Right 07/01/2018   Procedure: RIGHT ANKLE FRACTURE OPEN TREATMENT TRIMALLEOLAR INCLUDES INTERNAL FIXATION WITHOUT FIXATION OF POSTERIOR LIP, FRACTURE CLOSED TREATMENT TRIMALLEOLAR ANKLE WITH MANIPULATION, REPAIR SYNDESMOSIS, REPAIR DELTOID LIGAMENT;  Surgeon: Renette Butters, MD;  Location: [place];  Service: Orthopedics;  Laterality: Right;  . PARS PLANA VITRECTOMY Right 05/01/2017   Procedure: PARS PLANA VITRECTOMY WITH 25 GAUGE RIGHT EYE, endolaser photocoaglation;  Surgeon: Jalene Mullet, MD;  Location: [facility];  Service: Ophthalmology;  Laterality: Right;  . RADIOLOGY WITH ANESTHESIA N/A 04/07/2017   Procedure: RADIOLOGY WITH ANESTHESIA;  Surgeon: Consuella Lose, MD;  Location: [facility];  Service: Radiology;  Laterality: N/A;  . REPLACEMENT TOTAL KNEE BILATERAL  2004  . TEMPOROMANDIBULAR JOINT SURGERY     2 surgeries  . TUMOR REMOVAL    . VAGOTOMY  01/23/2012   Procedure: VAGOTOMY, antrectomy and BII;  Surgeon: Haywood Lasso, MD;  Location: [place];  Service: General;  Laterality: N/A;  Laparotomy with vagotomy.    There were no vitals filed for this visit.  Subjective Assessment - 10/20/18 1534    Subjective  Goes on Wednesday to orthopedist, is still going to request an MRI.  Still having constant, radiating pain.    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  Yes    Pain Onset  More than a month ago    Pain Onset  More than a month ago         [facility] PT Assessment - 10/20/18 1655      Assessment   Medical Diagnosis  ORIF R ankle s/p Trimalleolar fracture & Gait     Referring Provider (PT)  Edmonia Lynch, MD    Onset Date/Surgical Date  09/04/18      Precautions   Precautions  Fall     Precaution Comments  HTN ASSESS VITALS AT [facility] SESSION; dizziness and diplopia      Prior Function   Level of Independence  Independent with community mobility with device      Observation/Other Assessments   Focus on Therapeutic Outcomes (FOTO)   Not assessed                   [facility] [facility] PT Treatment/Exercise - 10/20/18 1636      Therapeutic Activites    Therapeutic Activities  Other Therapeutic Activities    Other Therapeutic Activities  Continued to assessed pt's pattern of pain; pt reports tenderness to palpation over R TFL and proximal quad muscles.  Palpation of TFL resulted in referred pain medially and palpation of proximal quad referred pain distally down anterior thigh.  Discussed dry needling with pt and husband - purpose, mechanism of treatment, precautions and muscles to be targeted.  Pt would be willing to try dry needling for pain management.  Will wait for pt to consult with orthopedist before adding to plan of care and scheduling in clinic.      Neuro Re-ed    Neuro Re-ed Details   NMR with focus on controlled weight shifting, postural control and ability to find limits of stability.  In corner, without UE support - performed lateral weight shifting and anterior/posterior weight shifting with narrow BOS with EO; then with EO with manual resistance to weight shift to increase mm activation and switching back and forth quickly between directions; final set performed with eyes closed for increased reliance on sensory input.  Transitioned to staggered stance and performed diagonal weight shifting with EO without resistance and then with therapist's resistance at pelvis.  Transitioned diagonal resistance at pelvis over to ambulation without UE support with verbal cues for more upright trunk.  Pt demonstrated increased trunk rotation, improved step and stride length bilaterally and decreased ataxia.               PT Education - 10/20/18 1650    Education provided   Yes    Education Details  dry needling R hip - TFL and quad    Person(s) Educated  Patient;Spouse    Methods  Explanation    Comprehension  Verbalized understanding       PT Short Term Goals - 10/09/18 1607      PT SHORT TERM GOAL #1   Title  Pt will report compliancy with performing initial HEP to further improve independence with functional mobility     Status  Achieved      PT SHORT TERM GOAL #2   Title  Pt will improve gait speed to >/= 2.56 ft/esc indicative of community ambulation with LRAD     Baseline  10/1: 1.96 ft/sec with RW  and CGA for safety considerations to 2.13 ft/sec (still with safety considerations) on 10/07/18    Status  Partially Met      PT SHORT TERM GOAL #3   Title  Pt will improve Berg score to >/= 30/56 indicating decreased fall risk potential     Baseline  10/1: 25/56 indicative of high risk for falls, 33/56 on 10/07/18    Status  Achieved      PT SHORT TERM GOAL #4   Title  Pt will increased TUG score by >/= 3 seconds indicating decreased risk for falls and improvement in functional mobility using LRAD    Baseline  27 secs with RW first rep, 18.78 secs with RW second RW on 10/07/17    Status  Achieved      PT SHORT TERM GOAL #5   Title  Pt will ambulate 230 feet on level surfaces with LRAD with therapist providing CGA to improve functional mobility     Baseline  met 10/07/18 230' with RW at S level with cues for safety    Status  Achieved      PT SHORT TERM GOAL #6   Title  Patient will perform 12 steps with alternating stepping pattern using B HR's to improve community accessibility with therapist providing min A     Status  Achieved        PT Long Term Goals - 10/09/18 2104      PT LONG TERM GOAL #1   Title  Pt will report compliancy and independence with HEP to further improve functional mobility       PT LONG TERM GOAL #2   Title  Pt will improve Berg score to >/= 36/56 to decrease fall risk potential       PT LONG TERM GOAL #3   Title   Pt will improve gait speed to >/=3.16 ft/sec indicating significant change in functional mobility with LRAD       PT LONG TERM GOAL #4   Title  Pt will improve TUG score to </= 13.5 seconds using LRAD indicating decreased fall risk potential       PT LONG TERM GOAL #6   Title  Pt will report ability to perform household ambulation using her RW with modified independence demonstrating significant improvement in functional mobility       PT LONG TERM GOAL #8   Title  Pt will ambulate over level surfaces with her RW for 230 feet with supervision using LRAD demonstrating improvement in functional mobility             Plan - 10/20/18 1651    Clinical Impression Statement  Continued NMR to focus on controlled weight shifting and postural control in static standing and during ambulation.  Continued to assess pt's pain in R hip and discuss options for pain management.  Will continue to assess if pt would be appropriate for dry needling after her appointment with orthopedic physician on Wednesday.    Rehab Potential  Good    Clinical Impairments Affecting Rehab Potential  high risk for falls due to abnormal gait, ataxia, sensory changes, and impaired cognition.     PT Frequency  2x / week    PT Duration  8 weeks    PT Treatment/Interventions  ADLs/Self Care Home Management;Aquatic Therapy;DME Instruction;Gait training;Stair training;Functional mobility training;Therapeutic activities;Therapeutic exercise;Balance training;Neuromuscular re-education;Patient/family education;Cognitive remediation;Orthotic Fit/Training;Electrical Stimulation;Cryotherapy;Moist Heat;Manual techniques;Passive range of motion;Taping;Energy conservation;Dry needling    PT Next Visit Plan  slow down movements and focus  on control of movement (sit <> stand, gait, weight shifting, stairs).  continue with LE strengthening and standing balance activities, trunk control in tall kneeling    PT Home Exercise Plan  Access Code:  [email or web address]     Recommended Other Services  Dry needling R TFL and proximal quad once POC returns    Consulted and Agree with Plan of Care  Patient;Family member/caregiver    Family Member Consulted  husband       Patient will benefit from skilled therapeutic intervention in order to improve the following deficits and impairments:  Abnormal gait, Decreased balance, Decreased cognition, Decreased coordination, Decreased strength, Difficulty walking, Impaired sensation, Pain, Decreased activity tolerance, Decreased mobility, Decreased endurance, Postural dysfunction, Decreased knowledge of use of DME, Impaired perceived functional ability  Visit Diagnosis: Ataxic gait  Other lack of coordination  Other abnormalities of gait and mobility  Unsteadiness on feet  Pain in right thigh     Problem List Patient Active Problem List   Diagnosis Date Noted  . Osteoarthritis of right hip 10/07/2018  . 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 Weakness with dizziness-  since [facility] and CVA 04/07/17 08/07/2017  . Disturbances of vision, late effect of stroke 08/06/2017  . Health education/counseling 08/05/2017  . High risk medications (not anticoagulants) long-term use 08/05/2017  . Neuritis-  R sided:  arm and leg/ body due to stroke 08/05/2017  . Elevated LDL cholesterol level 07/11/2017  . [employer] [facility] [employer] (NIH) Stroke Scale limb ataxia score 2, ataxia present in two limbs 06/25/2017  . 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 Elevated vitamin B12 level 05/30/2017  . Vitamin D deficiency 05/29/2017  . History of tobacco abuse-  30pk yr hx - quit 04/07/17 05/21/2017  . Gait disturbance, post-stroke 05/14/2017  . Benign essential HTN   . Vitreous hemorrhage of right eye ([place])   . Adjustment disorder with mixed anxiety and depressed mood   . 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 s/p SAH (subarachnoid  hemorrhage) ([place]) 04/19/2017  . 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 Hypoxia   . Subarachnoid hemorrhage due to ruptured aneurysm ([place]) 04/07/2017  . CVA (cerebral vascular accident) ([place]) 04/07/2017  . Elevated gastrin level 01/25/2012  . Hypokalemia 01/21/2012  . Nausea & vomiting 01/20/2012  . Epigastric pain 01/20/2012  . Duodenal ulcer, acute with obstruction 10/17/2011  . S/P laparoscopic cholecystectomy 10/16/2011    Rico Junker, PT, DPT 10/20/18    5:03 PM    [facility] [address] [place], [place], [address] Phone: [phone]   Fax:  [phone]  Name: [name] Barnett MRN: [id number] Date of Birth: Dec 14, 1975

## 2018-10-23 ENCOUNTER — Ambulatory Visit: Payer: BLUE CROSS/BLUE SHIELD | Admitting: Physical Therapy

## 2018-10-23 ENCOUNTER — Encounter: Payer: Self-pay | Admitting: Occupational Therapy

## 2018-10-23 ENCOUNTER — Ambulatory Visit: Payer: BLUE CROSS/BLUE SHIELD | Admitting: Occupational Therapy

## 2018-10-23 DIAGNOSIS — R2689 Other abnormalities of gait and mobility: Secondary | ICD-10-CM

## 2018-10-23 DIAGNOSIS — R293 Abnormal posture: Secondary | ICD-10-CM

## 2018-10-23 DIAGNOSIS — R278 Other lack of coordination: Secondary | ICD-10-CM

## 2018-10-23 DIAGNOSIS — R2681 Unsteadiness on feet: Secondary | ICD-10-CM

## 2018-10-23 DIAGNOSIS — R208 Other disturbances of skin sensation: Secondary | ICD-10-CM

## 2018-10-23 DIAGNOSIS — R270 Ataxia, unspecified: Secondary | ICD-10-CM

## 2018-10-23 DIAGNOSIS — I69054 Hemiplegia and hemiparesis following nontraumatic subarachnoid hemorrhage affecting left non-dominant side: Secondary | ICD-10-CM

## 2018-10-23 DIAGNOSIS — R260 Ataxic gait: Secondary | ICD-10-CM

## 2018-10-23 NOTE — Therapy (Addendum)
[facility] [address] [place], [place], [address] Phone: [phone]   Fax:  [phone]  Occupational Therapy Evaluation  Patient Details  Name: [name] Barnett MRN: [id number] Date of Birth: [birth year] Referring Provider (OT): Dr. Claudette Laws   Encounter Date: 10/23/2018  OT End of Session - 10/23/18 1636    Visit Number  48    Number of Visits  56    Date for OT Re-Evaluation  12/18/18   Authorization Type  [employer], no visit limit/auth req.    OT Start Time  1533    OT Stop Time  1610    OT Time Calculation (min)  37 min    Activity Tolerance  Patient tolerated treatment well       Past Medical History:  Diagnosis Date  . Duodenal obstruction   . GERD (gastroesophageal reflux disease)   . Hypertension   . Stroke [facility])    april 2018, lt sided weakness  . Trimalleolar fracture of ankle, closed, right, initial encounter     Past Surgical History:  Procedure Laterality Date  . BALLOON DILATION  12/31/2011   Procedure: BALLOON DILATION;  Surgeon: Barrie Folk, MD;  Location: [facility] ENDOSCOPY;  Service: Endoscopy;  Laterality: N/A;  . CHOLECYSTECTOMY  07/28/11  . ESOPHAGOGASTRODUODENOSCOPY  10/17/2011   Procedure: ESOPHAGOGASTRODUODENOSCOPY (EGD);  Surgeon: Barrie Folk, MD;  Location: [facility] ENDOSCOPY;  Service: Endoscopy;  Laterality: N/A;  . IR ANGIO INTRA EXTRACRAN SEL INTERNAL CAROTID BILAT MOD SED  04/07/2017  . IR ANGIO INTRA EXTRACRAN SEL INTERNAL CAROTID BILAT MOD SED  01/07/2018  . IR ANGIO VERTEBRAL SEL VERTEBRAL UNI R MOD SED  04/07/2017  . IR ANGIO VERTEBRAL SEL VERTEBRAL UNI R MOD SED  01/07/2018  . IR ANGIOGRAM FOLLOW UP STUDY  04/07/2017  . IR ANGIOGRAM FOLLOW UP STUDY  04/07/2017  . IR ANGIOGRAM FOLLOW UP STUDY  04/07/2017  . IR ANGIOGRAM FOLLOW UP STUDY  04/07/2017  . IR ANGIOGRAM FOLLOW UP STUDY  04/07/2017  . IR ANGIOGRAM SELECTIVE EACH ADDITIONAL VESSEL  04/07/2017  . IR TRANSCATH/EMBOLIZ  04/07/2017  .  IR US GUIDE VASC ACCESS RIGHT  01/07/2018  . ORIF ANKLE FRACTURE Right 07/01/2018   Procedure: RIGHT ANKLE FRACTURE OPEN TREATMENT TRIMALLEOLAR INCLUDES INTERNAL FIXATION WITHOUT FIXATION OF POSTERIOR LIP, FRACTURE CLOSED TREATMENT TRIMALLEOLAR ANKLE WITH MANIPULATION, REPAIR SYNDESMOSIS, REPAIR DELTOID LIGAMENT;  Surgeon: Sheral Apley, MD;  Location: [facility];  Service: Orthopedics;  Laterality: Right;  . PARS PLANA VITRECTOMY Right 05/01/2017   Procedure: PARS PLANA VITRECTOMY WITH 25 GAUGE RIGHT EYE, endolaser photocoaglation;  Surgeon: Carmela Rima, MD;  Location: [facility] OR;  Service: Ophthalmology;  Laterality: Right;  . RADIOLOGY WITH ANESTHESIA N/A 04/07/2017   Procedure: RADIOLOGY WITH ANESTHESIA;  Surgeon: Lisbeth Renshaw, MD;  Location: [facility] OR;  Service: Radiology;  Laterality: N/A;  . REPLACEMENT TOTAL KNEE BILATERAL  2004  . TEMPOROMANDIBULAR JOINT SURGERY     2 surgeries  . TUMOR REMOVAL    . VAGOTOMY  01/23/2012   Procedure: VAGOTOMY, antrectomy and BII;  Surgeon: Currie Paris, MD;  Location: [facility] OR;  Service: General;  Laterality: N/A;  Laparotomy with vagotomy.    There were no vitals filed for this visit.  Subjective Assessment - 10/23/18 1534    Subjective   I have tightness in the R ankle but no true pain.     Patient is accompained by:  Family member   mom   Pertinent History  Subarachnoid hemorrhage due to ruptured aneurysm, ataxia (L cerebellar); HTN, R side decr sensation, vertical diplopia/visual deficits, ataxia, Bilateral retinal hemorhage associated with SAH (with surgery on R eye in hospital and L approx 1 month ago)    Limitations  visual deficits, fall risk, impulsivity/cognitive deficits    Patient Stated Goals  I want to be able to walk without assistance so I can do all the things I need to do as a wife and mom, use my hand better.     Currently in Pain?  Yes    Pain Score  6     Pain Location  Hip    Pain Orientation  Right    Pain  Descriptors / Indicators  Aching    Pain Type  Chronic pain    Pain Onset  More than a month ago    Pain Frequency  Constant    Aggravating Factors   sitting too long - radiates down my leg    Pain Relieving Factors  getting up and moving helps, meds, stretching, exercise, and topical cream.         [facility] OT Assessment - 10/23/18 0001      Assessment   Medical Diagnosis  late effects of SAH due to ruptured aneurysm    Referring Provider (OT)  Dr. Claudette LawsAndrew Kirsteins    Onset Date/Surgical Date  01/03/18   date of OT referral   Hand Dominance  Right      Precautions   Precautions  Fall    Precaution Comments  HTN assessment each session      Restrictions   Weight Bearing Restrictions  No      Balance Screen   Has the patient fallen in the past 6 months  Yes   working with PT currently     Home  Environment   Family/patient expects to be discharged to:  --   see prior eval     ADL   Tub/Shower Transfer  Minimal assistance    ADL comments  Pt reports she is mod I with all basic ADL activities except shower transfers  Pt  does not like to shower and prefers to get down into tub therefore husband steadies pt as she lowers and raises herself.       IADL   Shopping  Needs to be accompanied on any shopping trip    Light Housekeeping  Performs light daily tasks such as dishwashing, bed making   washes dishes, does laundry,    Meal Prep  Plans, prepares and serves adequate meals independently    Community Mobility  Relies on family or friends for transportation    Medication Management  Takes responsibility if medication is prepared in advance in seperate dosage   husband supervises      Mobility   Mobility Status  History of falls    Mobility Status Comments  PT recommending supevision for ambulation with RW - pt reports she often walks unsupervised and sometimes without device.       Written Expression   Dominant Hand  Right      Vision Assessment   Comment  Pt wih  intermittent diplopia - pt has prisms in glasses that help signficantly      Activity Tolerance   Activity Tolerance  Tolerate 30+ min activity without fatigue      Cognition   Area of Impairment  Safety/judgement;Awareness    Safety/Judgement  Decreased awareness of safety;Decreased awareness of deficits    Attention  Alternating  Executive Function  Self Monitoring      Posture/Postural Control   Posture/Postural Control  Postural limitations    Posture Comments  Pt very ataxic and with poor awarenss of position in space.  Pt with forward leaning and heavy reliiance on UEs with RW      Sensation   Light Touch  Impaired Detail    Stereognosis  --    Hot/Cold  Impaired by gross assessment    Proprioception  Appears Intact    Additional Comments  For UE's - RUE impaired.       Coordination   Gross Motor Movements are Fluid and Coordinated  No   Pt is ataxic   Fine Motor Movements are Fluid and Coordinated  No      Tone   Assessment Location  Right Upper Extremity;Left Upper Extremity      ROM / Strength   AROM / PROM / Strength  AROM;Strength      AROM   Overall AROM   Within functional limits for tasks performed    Overall AROM Comments  BUE's      Strength   Overall Strength  Deficits    Overall Strength Comments  LUE 4/5 proximally, grip strength see below.  UE impacted by ataxic trunk as well.       Hand Function   Right Hand Gross Grasp  Impaired    Right Hand Grip (lbs)  42    Left Hand Gross Grasp  Impaired    Left Hand Grip (lbs)  40      RUE Tone   RUE Tone  Within Functional Limits      LUE Tone   LUE Tone  Within Functional Limits                           OT Long Term Goals - 10/23/18 1624      OT LONG TERM GOAL #1   Title  Pt and family willl be mod I with aquatic HEP (for use when pt has access to pool) - 12/18/2018   Status  New      OT LONG TERM GOAL #2   Title  Pt will demonstrate improved postural alignment and control  in standing as demonstrated by ability to do light home mgmt tasks in standing (such as fold laundry)    Status  New      OT LONG TERM GOAL #3   Title  Pt will report greater ease in using LUE for simple home mgmt tasks (such as unloading dishwasher into [employer]) .     Status  New      OT LONG TERM GOAL #4   Title  Pt will be mod I using RW during simple home mgmt tasks at ambulatory level.      Status  New            Plan - 10/23/18 1630    Clinical Impression Statement  Pt is a 42 year old female s/p SAH due to ruptured aneurysm 04/07/2017. Pt is well known to this clinic.  Pt was referred to OT for evaluation for potential aquatic therapy to assist with balance, ataxia, UE and LE strengthening and functional mobility. Pt demonstrates the following impairments: trunk and limb ataxia, decreased balance, abnormal posture, pain RLE, stiffness R ankle, decreased LUE strength and LE strength.  Pt will benefit from skilled aquatic therapy in conjunction with PT to address these deficits to  maximize independence and improve quality of life.      Occupational Profile and client history currently impacting functional performance  Pt is a 42 y.o. female s/p subarachnoid hemorrhage due to ruptured aneurysm, L cerebellar infarct with ataxia.  Pt was working full time and indpendent prior to hospitalization.  Pt lives with husband and cared for 60 y.o. daughter.  Medical history includes HTN, GERD, and Bilateral retinal hemorhage associated with SAH (needed surgery).      Occupational performance deficits (Please refer to evaluation for details):  ADL's;IADL's;Leisure;Social Participation;Work    Presenter, broadcasting    Current Impairments/barriers affecting progress:  cognitive deficits, impulsivity    OT Frequency  1x / week    OT Duration  8 weeks    OT Treatment/Interventions  Self-care/ADL training;Therapeutic exercise;Therapeutic activities;Cognitive remediation/compensation;Passive range  of motion;Functional Mobility Training;Neuromuscular education;Energy conservation;Manual Therapy;Patient/family education;Balance training;Aquatic Therapy    Plan  aquatic therapy treatment relative to balance, postural alignment and control, functional ambulation, UE and LE strengthening    Clinical Decision Making  Limited treatment options, no task modification necessary    Consulted and Agree with Plan of Care  Family member/caregiver;Patient    Family Member Consulted  mother       Patient will benefit from skilled therapeutic intervention in order to improve the following deficits and impairments:  Decreased coordination, Decreased range of motion, Difficulty walking, Abnormal gait, Decreased safety awareness, Impaired sensation, Decreased knowledge of precautions, Decreased balance, Decreased knowledge of use of DME, Impaired UE functional use, Decreased cognition, Decreased mobility, Decreased strength, Impaired vision/preception, Impaired perceived functional ability  Visit Diagnosis: Other lack of coordination - Plan: Ot plan of care cert/re-cert  Unsteadiness on feet - Plan: Ot plan of care cert/re-cert  Ataxia - Plan: Ot plan of care cert/re-cert  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 Osteoarthritis of right hip 10/07/2018  . 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 Weakness with dizziness-  since [facility] and CVA 04/07/17 08/07/2017  . Disturbances of vision, late effect of stroke 08/06/2017  . Health education/counseling 08/05/2017  . High risk medications (not anticoagulants) long-term use 08/05/2017  . Neuritis-  R sided:  arm and leg/ body due to stroke 08/05/2017  .  Elevated LDL cholesterol level 07/11/2017  . [employer] (NIH) Stroke Scale limb ataxia score 2, ataxia present in two limbs 06/25/2017  . 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 Elevated vitamin B12 level 05/30/2017  . Vitamin D deficiency 05/29/2017  . History of tobacco abuse-  30pk yr hx - quit 04/07/17 05/21/2017  . Gait disturbance, post-stroke 05/14/2017  . Benign essential HTN   . Vitreous hemorrhage of right eye ([name])   . Adjustment disorder with mixed anxiety and depressed mood   . 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 s/p SAH (subarachnoid hemorrhage) ([name]) 04/19/2017  . Basilar artery aneurysm ([name])   . Hypoxia   . 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 CVA (cerebral vascular accident) ([name]) 04/07/2017  . Elevated gastrin level 01/25/2012  . Hypokalemia 01/21/2012  . Nausea & vomiting 01/20/2012  .  Epigastric pain 01/20/2012  . Duodenal ulcer, acute with obstruction 10/17/2011  . S/P laparoscopic cholecystectomy 10/16/2011    Norton Pastel, OTR/L 10/23/2018, 4:40 PM  [facility] [address] [place], [place], [address] Phone: [phone]   Fax:  [phone]  Name: [name] Barnett MRN: [id number] Date of Birth: 1976/10/17

## 2018-10-23 NOTE — Therapy (Signed)
[facility] [address] [place], [place], [address] Phone: [phone]   Fax:  [phone]  Physical Therapy Treatment  Patient Details  Name: [name] Barnett MRN: [id number] Date of Birth: [birth year] Referring Provider (PT): Edmonia Lynch, MD   Encounter Date: 10/23/2018  PT End of Session - 10/23/18 1547    Visit Number  11    Number of Visits  17    Date for PT Re-Evaluation  11/08/18    Authorization Type  BCBS    PT Start Time  1447    PT Stop Time  1530    PT Time Calculation (min)  43 min    Equipment Utilized During Treatment  Gait belt    Activity Tolerance  Patient tolerated treatment well    Behavior During Therapy  [facility] for tasks assessed/performed       Past Medical History:  Diagnosis Date  . Duodenal obstruction   . GERD (gastroesophageal reflux disease)   . Hypertension   . Stroke [facility])    april 2018, lt sided weakness  . Trimalleolar fracture of ankle, closed, right, initial encounter     Past Surgical History:  Procedure Laterality Date  . BALLOON DILATION  12/31/2011   Procedure: BALLOON DILATION;  Surgeon: Missy Sabins, MD;  Location: [facility] ENDOSCOPY;  Service: Endoscopy;  Laterality: N/A;  . CHOLECYSTECTOMY  07/28/11  . ESOPHAGOGASTRODUODENOSCOPY  10/17/2011   Procedure: ESOPHAGOGASTRODUODENOSCOPY (EGD);  Surgeon: Missy Sabins, MD;  Location: [facility] ENDOSCOPY;  Service: Endoscopy;  Laterality: N/A;  . IR ANGIO INTRA EXTRACRAN SEL INTERNAL CAROTID BILAT MOD SED  04/07/2017  . IR ANGIO INTRA EXTRACRAN SEL INTERNAL CAROTID BILAT MOD SED  01/07/2018  . IR ANGIO VERTEBRAL SEL VERTEBRAL UNI R MOD SED  04/07/2017  . IR ANGIO VERTEBRAL SEL VERTEBRAL UNI R MOD SED  01/07/2018  . IR ANGIOGRAM FOLLOW UP STUDY  04/07/2017  . IR ANGIOGRAM FOLLOW UP STUDY  04/07/2017  . IR ANGIOGRAM FOLLOW UP STUDY  04/07/2017  . IR ANGIOGRAM FOLLOW UP STUDY  04/07/2017  . IR ANGIOGRAM FOLLOW UP STUDY  04/07/2017  . IR ANGIOGRAM  SELECTIVE EACH ADDITIONAL VESSEL  04/07/2017  . IR TRANSCATH/EMBOLIZ  04/07/2017  . IR US GUIDE VASC ACCESS RIGHT  01/07/2018  . ORIF ANKLE FRACTURE Right 07/01/2018   Procedure: RIGHT ANKLE FRACTURE OPEN TREATMENT TRIMALLEOLAR INCLUDES INTERNAL FIXATION WITHOUT FIXATION OF POSTERIOR LIP, FRACTURE CLOSED TREATMENT TRIMALLEOLAR ANKLE WITH MANIPULATION, REPAIR SYNDESMOSIS, REPAIR DELTOID LIGAMENT;  Surgeon: Renette Butters, MD;  Location: [facility] [facility];  Service: Orthopedics;  Laterality: Right;  . PARS PLANA VITRECTOMY Right 05/01/2017   Procedure: PARS PLANA VITRECTOMY WITH 25 GAUGE RIGHT EYE, endolaser photocoaglation;  Surgeon: Jalene Mullet, MD;  Location: [facility];  Service: Ophthalmology;  Laterality: Right;  . RADIOLOGY WITH ANESTHESIA N/A 04/07/2017   Procedure: RADIOLOGY WITH ANESTHESIA;  Surgeon: Consuella Lose, MD;  Location: [facility];  Service: Radiology;  Laterality: N/A;  . REPLACEMENT TOTAL KNEE BILATERAL  2004  . TEMPOROMANDIBULAR JOINT SURGERY     2 surgeries  . TUMOR REMOVAL    . VAGOTOMY  01/23/2012   Procedure: VAGOTOMY, antrectomy and BII;  Surgeon: Haywood Lasso, MD;  Location: [facility];  Service: General;  Laterality: N/A;  Laparotomy with vagotomy.    There were no vitals filed for this visit.  Subjective Assessment - 10/23/18 1443    Subjective  Dr. Percell Miller said xray confirmed arthiritis in the hip joint. He said he  would do MRI of spine if she wants. She is open to trying dry needling. Appt made with [facility] [place] for next Tues to have this done. She has not had any falls.    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  Yes    Pain Score  6     Pain Location  Hip    Pain Orientation  Right     Pain Descriptors / Indicators  Aching    Pain Type  Chronic pain    Pain Onset  More than a month ago    Pain Frequency  Constant    Aggravating Factors   weight bearing,    Pain Relieving Factors  rest, stretching             [facility] Adult PT Treatment/Exercise - 10/23/18 0001      Ambulation/Gait   Ambulation/Gait  Yes    Ambulation/Gait Assistance  4: Min guard    Ambulation/Gait Assistance Details  Vcs to decrease speed and RW direction; Approximation through hips to decrease ataxic movements.    Ambulation Distance (Feet)  115 Feet    Assistive device  Rolling walker    Gait Pattern  Step-through pattern;Decreased stance time - left;Decreased stride length;Ataxic;Lateral hip instability;Wide base of support    Ambulation Surface  Level;Indoor    Gait Comments  Verbal and tactile cues given for improved gait pattern;       High Level Balance   High Level Balance Activities  Other (comment)    High Level Balance Comments  Patient performed static standing balance with wide BOS progressing to narrow BOS with eyes closed; Pt required min assist to maintian balance.      Neuro Re-ed    Neuro Re-ed Details   Pt performed bridges x10 on mat table and SLR with clockwise/counterclockwise circle BIL x5 each; performed tall kneeling fwd/lateral/posterior on red mat 3 laps each; Pt then sat on swiss ball with wide BOS progressing to narrow BOS and performing reaching activities;    VCs to engage core     Knee/Hip Exercises: Supine   Other Supine Knee/Hip Exercises  --               PT Short Term Goals - 10/09/18 1607      PT SHORT TERM GOAL #1   Title  Pt will report compliancy with performing initial HEP to further improve independence with functional mobility     Status  Achieved      PT SHORT TERM GOAL #2   Title  Pt will improve gait speed to >/= 2.56 ft/esc indicative of community ambulation with LRAD     Baseline  10/1: 1.96 ft/sec with RW and CGA for safety  considerations to 2.13 ft/sec (still with safety considerations) on 10/07/18    Status  Partially Met      PT SHORT TERM GOAL #3   Title  Pt will improve Berg score to >/= 30/56 indicating decreased fall risk potential     Baseline  10/1: 25/56 indicative of high risk for falls, 33/56 on 10/07/18    Status  Achieved      PT SHORT TERM GOAL #4   Title  Pt  will increased TUG score by >/= 3 seconds indicating decreased risk for falls and improvement in functional mobility using LRAD    Baseline  27 secs with RW first rep, 18.78 secs with RW second RW on 10/07/17    Status  Achieved      PT SHORT TERM GOAL #5   Title  Pt will ambulate 230 feet on level surfaces with LRAD with therapist providing CGA to improve functional mobility     Baseline  met 10/07/18 230' with RW at S level with cues for safety    Status  Achieved      PT SHORT TERM GOAL #6   Title  Patient will perform 12 steps with alternating stepping pattern using B HR's to improve community accessibility with therapist providing min A     Status  Achieved        PT Long Term Goals - 10/09/18 2104      PT LONG TERM GOAL #1   Title  Pt will report compliancy and independence with HEP to further improve functional mobility       PT LONG TERM GOAL #2   Title  Pt will improve Berg score to >/= 36/56 to decrease fall risk potential       PT LONG TERM GOAL #3   Title  Pt will improve gait speed to >/=3.16 ft/sec indicating significant change in functional mobility with LRAD       PT LONG TERM GOAL #4   Title  Pt will improve TUG score to </= 13.5 seconds using LRAD indicating decreased fall risk potential       PT LONG TERM GOAL #6   Title  Pt will report ability to perform household ambulation using her RW with modified independence demonstrating significant improvement in functional mobility       PT LONG TERM GOAL #8   Title  Pt will ambulate over level surfaces with her RW for 230 feet with supervision using LRAD  demonstrating improvement in functional mobility             Plan - 10/23/18 1547    Clinical Impression Statement  Treatment focused on balance and neurmusclar re-ed. She demonstrated improved core stabilization. Discussed aquatic therapy and dry needling which pt is interested in both. Pt would benefit from further therapy to increase neuromusclar control and functional strengthening.    Clinical Presentation  Evolving    Clinical Decision Making  Moderate    Clinical Impairments Affecting Rehab Potential  high risk for falls due to abnormal gait, ataxia, sensory changes, and impaired cognition.     PT Frequency  2x / week    PT Duration  8 weeks    PT Treatment/Interventions  ADLs/Self Care Home Management;Aquatic Therapy;DME Instruction;Gait training;Stair training;Functional mobility training;Therapeutic activities;Therapeutic exercise;Balance training;Neuromuscular re-education;Patient/family education;Cognitive remediation;Orthotic Fit/Training;Electrical Stimulation;Cryotherapy;Moist Heat;Manual techniques;Passive range of motion;Taping;Energy conservation;Dry needling    PT Next Visit Plan  slow down movements and focus on control of movement (sit <> stand, gait, weight shifting, stairs).  continue with LE strengthening and standing balance activities, trunk control in tall kneeling    PT Home Exercise Plan  Access Code: [email or web address]     Consulted and Agree with Plan of Care  Patient;Family member/caregiver    Family Member Consulted  Mother       Patient will benefit from skilled therapeutic intervention in order to improve the following deficits and impairments:  Abnormal gait, Decreased balance, Decreased cognition, Decreased coordination, Decreased strength, Difficulty walking, Impaired  sensation, Pain, Decreased activity tolerance, Decreased mobility, Decreased endurance, Postural dysfunction, Decreased knowledge of use of DME, Impaired perceived functional ability  Visit  Diagnosis: Ataxic gait  Other lack of coordination  Other abnormalities of gait and mobility     Problem List Patient Active Problem List   Diagnosis Date Noted  . Osteoarthritis of right hip 10/07/2018  . 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 Weakness with dizziness-  since [facility] and CVA 04/07/17 08/07/2017  . Disturbances of vision, late effect of stroke 08/06/2017  . Health education/counseling 08/05/2017  . High risk medications (not anticoagulants) long-term use 08/05/2017  . Neuritis-  R sided:  arm and leg/ body due to stroke 08/05/2017  . Elevated LDL cholesterol level 07/11/2017  . [employer] (NIH) Stroke Scale limb ataxia score 2, ataxia present in two limbs 06/25/2017  . 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 Elevated vitamin B12 level 05/30/2017  . Vitamin D deficiency 05/29/2017  . History of tobacco abuse-  30pk yr hx - quit 04/07/17 05/21/2017  . Gait disturbance, post-stroke 05/14/2017  . Benign essential HTN   . Vitreous hemorrhage of right eye ([place])   . Adjustment disorder with mixed anxiety and depressed mood   . 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 s/p SAH (subarachnoid hemorrhage) ([place]) 04/19/2017  . 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 Hypoxia   . Subarachnoid hemorrhage due to ruptured aneurysm ([place]) 04/07/2017  . CVA (cerebral vascular accident) ([place]) 04/07/2017  . Elevated gastrin level 01/25/2012  . Hypokalemia 01/21/2012  . Nausea & vomiting 01/20/2012  . Epigastric pain 01/20/2012  . Duodenal ulcer, acute with obstruction 10/17/2011  . S/P laparoscopic cholecystectomy 10/16/2011    Donnald Garre SPTA 10/23/2018, 3:51 PM  [facility] [address] [place], [place], [address] Phone: [phone]   Fax:  [phone]  Name: VAISHNAVI DALBY MRN: [id number] Date of Birth:  1976-07-30

## 2018-10-27 ENCOUNTER — Ambulatory Visit: Payer: BLUE CROSS/BLUE SHIELD | Admitting: Physical Therapy

## 2018-10-28 ENCOUNTER — Ambulatory Visit: Payer: BLUE CROSS/BLUE SHIELD | Admitting: Physical Therapy

## 2018-10-28 ENCOUNTER — Encounter: Payer: Self-pay | Admitting: Physical Therapy

## 2018-10-28 DIAGNOSIS — R2689 Other abnormalities of gait and mobility: Secondary | ICD-10-CM

## 2018-10-28 DIAGNOSIS — R278 Other lack of coordination: Secondary | ICD-10-CM

## 2018-10-28 DIAGNOSIS — R260 Ataxic gait: Secondary | ICD-10-CM

## 2018-10-28 DIAGNOSIS — I69054 Hemiplegia and hemiparesis following nontraumatic subarachnoid hemorrhage affecting left non-dominant side: Secondary | ICD-10-CM

## 2018-10-28 DIAGNOSIS — R2681 Unsteadiness on feet: Secondary | ICD-10-CM

## 2018-10-28 NOTE — Patient Instructions (Signed)
Flexion: Stretch - Quadriceps (Prone)    Position Helper: Place one hand on shin near left ankle. Stabilize buttock to keep hip flat on bed. Motion - Helper presses heel toward buttock slowly. CAUTION: Patient should feel stretch along front of thigh. There should be no pain in knee joint. Hold _30-60__ seconds. Repeat _3__ times. Repeat with other leg. **can add in small towel under knee to feel stretch in hip flexor**

## 2018-10-28 NOTE — Addendum Note (Signed)
Addended by: Jonna MunroPULASKI, Christol Thetford H on: 10/28/2018 06:42 PM   Modules accepted: Orders

## 2018-10-28 NOTE — Therapy (Signed)
[facility] [address] [place], [place], [address] Phone: [phone]   Fax:  [phone]  Physical Therapy Treatment  Patient Details  Name: [name] Barnett MRN: [id number] Date of Birth: 1975-12-27 Referring Provider (PT): Edmonia Lynch, MD   Encounter Date: 10/28/2018  PT End of Session - 10/28/18 1651    Visit Number  12    Number of Visits  17    Date for PT Re-Evaluation  11/08/18    Authorization Type  BCBS    PT Start Time  1532    PT Stop Time  1617    PT Time Calculation (min)  45 min    Activity Tolerance  Patient tolerated treatment well    Behavior During Therapy  [facility] for tasks assessed/performed       Past Medical History:  Diagnosis Date  . Duodenal obstruction   . GERD (gastroesophageal reflux disease)   . Hypertension   . Stroke [facility])    april 2018, lt sided weakness  . Trimalleolar fracture of ankle, closed, right, initial encounter     Past Surgical History:  Procedure Laterality Date  . BALLOON DILATION  12/31/2011   Procedure: BALLOON DILATION;  Surgeon: Missy Sabins, MD;  Location: [facility] ENDOSCOPY;  Service: Endoscopy;  Laterality: N/A;  . CHOLECYSTECTOMY  07/28/11  . ESOPHAGOGASTRODUODENOSCOPY  10/17/2011   Procedure: ESOPHAGOGASTRODUODENOSCOPY (EGD);  Surgeon: Missy Sabins, MD;  Location: [facility] ENDOSCOPY;  Service: Endoscopy;  Laterality: N/A;  . IR ANGIO INTRA EXTRACRAN SEL INTERNAL CAROTID BILAT MOD SED  04/07/2017  . IR ANGIO INTRA EXTRACRAN SEL INTERNAL CAROTID BILAT MOD SED  01/07/2018  . IR ANGIO VERTEBRAL SEL VERTEBRAL UNI R MOD SED  04/07/2017  . IR ANGIO VERTEBRAL SEL VERTEBRAL UNI R MOD SED  01/07/2018  . IR ANGIOGRAM FOLLOW UP STUDY  04/07/2017  . IR ANGIOGRAM FOLLOW UP STUDY  04/07/2017  . IR ANGIOGRAM FOLLOW UP STUDY  04/07/2017  . IR ANGIOGRAM FOLLOW UP STUDY  04/07/2017  . IR ANGIOGRAM FOLLOW UP STUDY  04/07/2017  . IR ANGIOGRAM SELECTIVE EACH ADDITIONAL VESSEL  04/07/2017  . IR  TRANSCATH/EMBOLIZ  04/07/2017  . IR US GUIDE VASC ACCESS RIGHT  01/07/2018  . ORIF ANKLE FRACTURE Right 07/01/2018   Procedure: RIGHT ANKLE FRACTURE OPEN TREATMENT TRIMALLEOLAR INCLUDES INTERNAL FIXATION WITHOUT FIXATION OF POSTERIOR LIP, FRACTURE CLOSED TREATMENT TRIMALLEOLAR ANKLE WITH MANIPULATION, REPAIR SYNDESMOSIS, REPAIR DELTOID LIGAMENT;  Surgeon: Renette Butters, MD;  Location: [facility];  Service: Orthopedics;  Laterality: Right;  . PARS PLANA VITRECTOMY Right 05/01/2017   Procedure: PARS PLANA VITRECTOMY WITH 25 GAUGE RIGHT EYE, endolaser photocoaglation;  Surgeon: Jalene Mullet, MD;  Location: [place];  Service: Ophthalmology;  Laterality: Right;  . RADIOLOGY WITH ANESTHESIA N/A 04/07/2017   Procedure: RADIOLOGY WITH ANESTHESIA;  Surgeon: Consuella Lose, MD;  Location: [facility];  Service: Radiology;  Laterality: N/A;  . REPLACEMENT TOTAL KNEE BILATERAL  2004  . TEMPOROMANDIBULAR JOINT SURGERY     2 surgeries  . TUMOR REMOVAL    . VAGOTOMY  01/23/2012   Procedure: VAGOTOMY, antrectomy and BII;  Surgeon: Haywood Lasso, MD;  Location: [facility];  Service: General;  Laterality: N/A;  Laparotomy with vagotomy.    There were no vitals filed for this visit.  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  Yes    Pain Score  5     Pain Location  Hip   and thigh   Pain Orientation  Right    Pain Descriptors / Indicators  Aching;Discomfort    Pain Type  Chronic pain                       [facility] Adult PT Treatment/Exercise - 10/28/18 0001      Ambulation/Gait   Ambulation/Gait  Yes    Ambulation/Gait  Assistance  4: Min guard;4: Min assist    Ambulation/Gait Assistance Details  1 lap with RW focusing on increasesing weight shift to R LE; 1 lap with no AD with facilitation at B hips for pelvic rotation and stability - improved weight shifting to R LE    Ambulation Distance (Feet)  230 Feet    Assistive device  Rolling walker;None    Gait Pattern  Step-through pattern;Decreased stance time - left;Decreased stride length;Ataxic;Lateral hip instability;Wide base of support    Ambulation Surface  Level;Indoor      Exercises   Exercises  Knee/Hip      Knee/Hip Exercises: Stretches   Sports administrator  Right;3 reps;30 seconds    Sports administrator Limitations  prone with overpresure by PT - discussion on placing towel under knee for hip felxor tightness      Manual Therapy   Manual Therapy  Soft tissue mobilization;Myofascial release    Manual therapy comments  patient supine with PT propping knee for comfort    Soft tissue mobilization  STM to R lateral thigh and TFL    Myofascial Release  manual trigger point release to R lateral thigh       Trigger Point Dry Needling - 10/28/18 1639    Consent Given?  Yes    Education Handout Provided  Yes    Muscles Treated Lower Body  Quadriceps;Tensor fascia lata    Tensor Fascia Lata Response  Twitch response elicited;Palpable increased muscle length    Quadriceps Response  Twitch response elicited;Palpable increased muscle length             PT Short Term Goals - 10/09/18 1607      PT SHORT TERM GOAL #1   Title  Pt will report compliancy with performing initial HEP to further improve independence with functional mobility     Status  Achieved      PT SHORT TERM GOAL #2   Title  Pt will improve gait speed to >/= 2.56 ft/esc indicative of community ambulation with LRAD     Baseline  10/1: 1.96 ft/sec with RW and CGA for safety considerations to 2.13 ft/sec (still with safety considerations) on 10/07/18    Status  Partially Met      PT SHORT TERM  GOAL #3   Title  Pt will improve Berg score to >/= 30/56 indicating decreased fall risk potential     Baseline  10/1: 25/56 indicative of high risk for falls, 33/56 on 10/07/18    Status  Achieved      PT SHORT TERM GOAL #4   Title  Pt will increased TUG score by >/= 3 seconds indicating decreased risk for falls and improvement in  functional mobility using LRAD    Baseline  27 secs with RW first rep, 18.78 secs with RW second RW on 10/07/17    Status  Achieved      PT SHORT TERM GOAL #5   Title  Pt will ambulate 230 feet on level surfaces with LRAD with therapist providing CGA to improve functional mobility     Baseline  met 10/07/18 230' with RW at S level with cues for safety    Status  Achieved      PT SHORT TERM GOAL #6   Title  Patient will perform 12 steps with alternating stepping pattern using B HR's to improve community accessibility with therapist providing min A     Status  Achieved        PT Long Term Goals - 10/09/18 2104      PT LONG TERM GOAL #1   Title  Pt will report compliancy and independence with HEP to further improve functional mobility       PT LONG TERM GOAL #2   Title  Pt will improve Berg score to >/= 36/56 to decrease fall risk potential       PT LONG TERM GOAL #3   Title  Pt will improve gait speed to >/=3.16 ft/sec indicating significant change in functional mobility with LRAD       PT LONG TERM GOAL #4   Title  Pt will improve TUG score to </= 13.5 seconds using LRAD indicating decreased fall risk potential       PT LONG TERM GOAL #6   Title  Pt will report ability to perform household ambulation using her RW with modified independence demonstrating significant improvement in functional mobility       PT LONG TERM GOAL #8   Title  Pt will ambulate over level surfaces with her RW for 230 feet with supervision using LRAD demonstrating improvement in functional mobility             Plan - 10/28/18 1651    Clinical Impression Statement   Heavy manual focus this visit as patient with primary complaints of R thigh pain. TDN performed to R lateral quad and TFL with good response. Patient reporting pain resolution at R thigh following treatment. Able to ambulate with improved upright posturing following TDN. Education on modified quad/hip flexor stretch.     Clinical Impairments Affecting Rehab Potential  high risk for falls due to abnormal gait, ataxia, sensory changes, and impaired cognition.     PT Frequency  2x / week    PT Duration  8 weeks    PT Treatment/Interventions  ADLs/Self Care Home Management;Aquatic Therapy;DME Instruction;Gait training;Stair training;Functional mobility training;Therapeutic activities;Therapeutic exercise;Balance training;Neuromuscular re-education;Patient/family education;Cognitive remediation;Orthotic Fit/Training;Electrical Stimulation;Cryotherapy;Moist Heat;Manual techniques;Passive range of motion;Taping;Energy conservation;Dry needling    PT Next Visit Plan  slow down movements and focus on control of movement (sit <> stand, gait, weight shifting, stairs).  continue with LE strengthening and standing balance activities, trunk control in tall kneeling    PT Home Exercise Plan  Access Code: [email or web address]     Consulted and Agree with Plan of Care  Patient;Family member/caregiver    Family Member Consulted  Mother       Patient will benefit from skilled therapeutic intervention in order to improve the following deficits and impairments:  Abnormal gait, Decreased balance, Decreased cognition, Decreased coordination, Decreased strength, Difficulty walking, Impaired sensation, Pain, Decreased activity tolerance, Decreased mobility, Decreased endurance, Postural dysfunction, Decreased knowledge of use of DME, Impaired  perceived functional ability  Visit Diagnosis: 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 Osteoarthritis of right hip 10/07/2018  . 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 Weakness with dizziness-  since [facility] and CVA 04/07/17 08/07/2017  . Disturbances of vision, late effect of stroke 08/06/2017  . Health education/counseling 08/05/2017  . High risk medications (not anticoagulants) long-term use 08/05/2017  . Neuritis-  R sided:  arm and leg/ body due to stroke 08/05/2017  . Elevated LDL cholesterol level 07/11/2017  . [employer] (NIH) Stroke Scale limb ataxia score 2, ataxia present in two limbs 06/25/2017  . 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 Elevated vitamin B12 level 05/30/2017  . Vitamin D deficiency 05/29/2017  . History of tobacco abuse-  30pk yr hx - quit 04/07/17 05/21/2017  . Gait disturbance, post-stroke 05/14/2017  . Benign essential HTN   . Vitreous hemorrhage of right eye ([place])   . Adjustment disorder with mixed anxiety and depressed mood   . 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 s/p SAH (subarachnoid hemorrhage) ([place]) 04/19/2017  . 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 Hypoxia   . Subarachnoid hemorrhage due to ruptured aneurysm ([place]) 04/07/2017  . CVA (cerebral vascular accident) ([place]) 04/07/2017  . Elevated gastrin level 01/25/2012  . Hypokalemia 01/21/2012  . Nausea & vomiting 01/20/2012  . Epigastric pain 01/20/2012  . Duodenal ulcer, acute with obstruction 10/17/2011  . S/P laparoscopic cholecystectomy 10/16/2011    Lanney Gins, PT, DPT Supplemental Physical Therapist 10/28/18 4:57 PM Pager: [phone] Office: [facility] [address] [place], [place], [address] Phone: [phone]   Fax:   [phone]  Name: [name] Barnett MRN: [id number] Date of Birth: [birth year]

## 2018-10-29 ENCOUNTER — Ambulatory Visit: Payer: BLUE CROSS/BLUE SHIELD | Admitting: Occupational Therapy

## 2018-10-30 ENCOUNTER — Ambulatory Visit: Payer: BLUE CROSS/BLUE SHIELD | Admitting: Physical Therapy

## 2018-11-03 ENCOUNTER — Ambulatory Visit: Payer: BLUE CROSS/BLUE SHIELD | Admitting: Physical Therapy

## 2018-11-03 ENCOUNTER — Encounter: Payer: Self-pay | Admitting: Physical Therapy

## 2018-11-03 DIAGNOSIS — R278 Other lack of coordination: Secondary | ICD-10-CM | POA: Diagnosis not present

## 2018-11-03 DIAGNOSIS — R2689 Other abnormalities of gait and mobility: Secondary | ICD-10-CM

## 2018-11-03 DIAGNOSIS — R2681 Unsteadiness on feet: Secondary | ICD-10-CM

## 2018-11-03 NOTE — Therapy (Signed)
[facility] [address] [place], [place], [address] Phone: [phone]   Fax:  [phone]  Physical Therapy Treatment  Patient Details  Name: [name] Barnett MRN: [id number] Date of Birth: 01-04-1976 Referring Provider (PT): Edmonia Lynch, MD   Encounter Date: 11/03/2018  PT End of Session - 11/03/18 2044    Visit Number  13    Number of Visits  17    Date for PT Re-Evaluation  11/08/18    Authorization Type  BCBS    PT Start Time  1534    PT Stop Time  1623    PT Time Calculation (min)  49 min       Past Medical History:  Diagnosis Date  . Duodenal obstruction   . GERD (gastroesophageal reflux disease)   . Hypertension   . Stroke [facility])    april 2018, lt sided weakness  . Trimalleolar fracture of ankle, closed, right, initial encounter     Past Surgical History:  Procedure Laterality Date  . BALLOON DILATION  12/31/2011   Procedure: BALLOON DILATION;  Surgeon: Missy Sabins, MD;  Location: [facility] ENDOSCOPY;  Service: Endoscopy;  Laterality: N/A;  . CHOLECYSTECTOMY  07/28/11  . ESOPHAGOGASTRODUODENOSCOPY  10/17/2011   Procedure: ESOPHAGOGASTRODUODENOSCOPY (EGD);  Surgeon: Missy Sabins, MD;  Location: [facility] ENDOSCOPY;  Service: Endoscopy;  Laterality: N/A;  . IR ANGIO INTRA EXTRACRAN SEL INTERNAL CAROTID BILAT MOD SED  04/07/2017  . IR ANGIO INTRA EXTRACRAN SEL INTERNAL CAROTID BILAT MOD SED  01/07/2018  . IR ANGIO VERTEBRAL SEL VERTEBRAL UNI R MOD SED  04/07/2017  . IR ANGIO VERTEBRAL SEL VERTEBRAL UNI R MOD SED  01/07/2018  . IR ANGIOGRAM FOLLOW UP STUDY  04/07/2017  . IR ANGIOGRAM FOLLOW UP STUDY  04/07/2017  . IR ANGIOGRAM FOLLOW UP STUDY  04/07/2017  . IR ANGIOGRAM FOLLOW UP STUDY  04/07/2017  . IR ANGIOGRAM FOLLOW UP STUDY  04/07/2017  . IR ANGIOGRAM SELECTIVE EACH ADDITIONAL VESSEL  04/07/2017  . IR TRANSCATH/EMBOLIZ  04/07/2017  . IR US GUIDE VASC ACCESS RIGHT  01/07/2018  . ORIF ANKLE FRACTURE Right 07/01/2018    Procedure: RIGHT ANKLE FRACTURE OPEN TREATMENT TRIMALLEOLAR INCLUDES INTERNAL FIXATION WITHOUT FIXATION OF POSTERIOR LIP, FRACTURE CLOSED TREATMENT TRIMALLEOLAR ANKLE WITH MANIPULATION, REPAIR SYNDESMOSIS, REPAIR DELTOID LIGAMENT;  Surgeon: Renette Butters, MD;  Location: [place];  Service: Orthopedics;  Laterality: Right;  . PARS PLANA VITRECTOMY Right 05/01/2017   Procedure: PARS PLANA VITRECTOMY WITH 25 GAUGE RIGHT EYE, endolaser photocoaglation;  Surgeon: Jalene Mullet, MD;  Location: [place];  Service: Ophthalmology;  Laterality: Right;  . RADIOLOGY WITH ANESTHESIA N/A 04/07/2017   Procedure: RADIOLOGY WITH ANESTHESIA;  Surgeon: Consuella Lose, MD;  Location: [facility];  Service: Radiology;  Laterality: N/A;  . REPLACEMENT TOTAL KNEE BILATERAL  2004  . TEMPOROMANDIBULAR JOINT SURGERY     2 surgeries  . TUMOR REMOVAL    . VAGOTOMY  01/23/2012   Procedure: VAGOTOMY, antrectomy and BII;  Surgeon: Haywood Lasso, MD;  Location: [facility];  Service: General;  Laterality: N/A;  Laparotomy with vagotomy.    There were no vitals filed for this visit.  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  Yes    Pain Score  5     Pain Location  Groin    Pain Orientation  Right    Pain Descriptors / Indicators  Aching;Grimacing;Guarding;Tender    Pain Type  Chronic pain    Pain Onset  More than a month ago    Pain Frequency  Constant                       [facility]  Adult PT Treatment/Exercise - 11/03/18 1603      Transfers   Transfers  Sit to Stand    Sit to Stand  4: Min guard    Sit to Stand Details  Verbal cues for technique;Verbal cues for precautions/safety;Verbal cues for gait pattern    Stand to Sit  5: Supervision    Stand to Sit Details (indicate cue type and reason)  Verbal cues for technique;Verbal cues for sequencing;Verbal cues for precautions/safety    Comments  pt has difficulty with controlled descent      Knee/Hip Exercises: Stretches   Hip Flexor Stretch  Right;3 reps;10 seconds      TherEx; passive Rt hip flexor stretching with pt in supine - attempted pt to perform Rt hip extension control exercise off side of mat,  but pt unable to do due to c/o Rt hip flexor pain due to tightness with RLE lowered into hip extension  Pt then transferred to Lt sidelying - performed passive Rt hip flexor stretch with contract/relax approx. 8-10 reps; pt performed active Rt hip  Flexion/extension in sidelying position Pt transferred to prone position - contract/relax with pt's RLE passively extended - pt performed isometric contractions of Rt hip flexors 10 reps; Able to passively extend Rt hip in prone after these exercises;  Pt performed Rt hip extension with Rt knee flexed at 90 degrees 10 reps in prone  NeuroRe-ed:  Tall kneeling activities on red mat on floor; weight shifting in tall kneeling with bilateral UE support, then with 1 UE support; pt unable to Perform without UE support  Pt performed lifting opposite UE's in flexion 5 reps each; performed head turns 10 reps horizontally with UE support prn with min assist for balance recovery  Pt performed amb. In tall kneeling on red mat - 2 reps forward, then backward; 2 reps laterally with UE support on mat prn           PT Short Term Goals - 10/09/18 1607      PT SHORT TERM GOAL #1   Title  Pt will report compliancy with performing initial HEP to further improve independence with  functional mobility     Status  Achieved      PT SHORT TERM GOAL #2   Title  Pt will improve gait speed to >/= 2.56 ft/esc indicative of community ambulation with LRAD     Baseline  10/1: 1.96 ft/sec with RW and CGA for safety considerations to 2.13 ft/sec (still with safety considerations) on 10/07/18    Status  Partially Met      PT SHORT TERM GOAL #3   Title  Pt will improve Berg score to >/= 30/56 indicating decreased fall risk potential  Baseline  10/1: 25/56 indicative of high risk for falls, 33/56 on 10/07/18    Status  Achieved      PT SHORT TERM GOAL #4   Title  Pt will increased TUG score by >/= 3 seconds indicating decreased risk for falls and improvement in functional mobility using LRAD    Baseline  27 secs with RW first rep, 18.78 secs with RW second RW on 10/07/17    Status  Achieved      PT SHORT TERM GOAL #5   Title  Pt will ambulate 230 feet on level surfaces with LRAD with therapist providing CGA to improve functional mobility     Baseline  met 10/07/18 230' with RW at S level with cues for safety    Status  Achieved      PT SHORT TERM GOAL #6   Title  Patient will perform 12 steps with alternating stepping pattern using B HR's to improve community accessibility with therapist providing min A     Status  Achieved        PT Long Term Goals - 10/09/18 2104      PT LONG TERM GOAL #1   Title  Pt will report compliancy and independence with HEP to further improve functional mobility       PT LONG TERM GOAL #2   Title  Pt will improve Berg score to >/= 36/56 to decrease fall risk potential       PT LONG TERM GOAL #3   Title  Pt will improve gait speed to >/=3.16 ft/sec indicating significant change in functional mobility with LRAD       PT LONG TERM GOAL #4   Title  Pt will improve TUG score to </= 13.5 seconds using LRAD indicating decreased fall risk potential       PT LONG TERM GOAL #6   Title  Pt will report ability to perform household ambulation  using her RW with modified independence demonstrating significant improvement in functional mobility       PT LONG TERM GOAL #8   Title  Pt will ambulate over level surfaces with her RW for 230 feet with supervision using LRAD demonstrating improvement in functional mobility             Plan - 11/03/18 2044    Clinical Impression Statement  Pt reports much improvement/relief after performing several reps of contract/relax for hip flexor stretching with pt in prone and in sidelying positions.  Pt able to tolerate passive stretching, achieving hip extension to neutral and past neutral at end of session, whereas, pt unable to place RLE in neutral position in Lt sidelying due to hip flexor tightness.      Rehab Potential  Good    Clinical Impairments Affecting Rehab Potential  high risk for falls due to abnormal gait, ataxia, sensory changes, and impaired cognition.     PT Frequency  2x / week    PT Duration  8 weeks    PT Treatment/Interventions  ADLs/Self Care Home Management;Aquatic Therapy;DME Instruction;Gait training;Stair training;Functional mobility training;Therapeutic activities;Therapeutic exercise;Balance training;Neuromuscular re-education;Patient/family education;Cognitive remediation;Orthotic Fit/Training;Electrical Stimulation;Cryotherapy;Moist Heat;Manual techniques;Passive range of motion;Taping;Energy conservation;Dry needling    PT Next Visit Plan  DN - Rt hip flexor region as pt reports this area is now very painful and no longer Rt quad pain:  cont with tall kneeling, balance activities     PT Home Exercise Plan  Access Code: [email or web address]     Consulted and Agree with Plan of  Care  Patient;Family member/caregiver    Family Member Consulted  Mother       Patient will benefit from skilled therapeutic intervention in order to improve the following deficits and impairments:  Abnormal gait, Decreased balance, Decreased cognition, Decreased coordination, Decreased strength,  Difficulty walking, Impaired sensation, Pain, Decreased activity tolerance, Decreased mobility, Decreased endurance, Postural dysfunction, Decreased knowledge of use of DME, Impaired perceived functional ability  Visit Diagnosis: Other abnormalities of gait and mobility  Unsteadiness on feet     Problem List Patient Active Problem List   Diagnosis Date Noted  . Osteoarthritis of right hip 10/07/2018  . 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 Weakness with dizziness-  since [facility] and CVA 04/07/17 08/07/2017  . Disturbances of vision, late effect of stroke 08/06/2017  . Health education/counseling 08/05/2017  . High risk medications (not anticoagulants) long-term use 08/05/2017  . Neuritis-  R sided:  arm and leg/ body due to stroke 08/05/2017  . Elevated LDL cholesterol level 07/11/2017  . [employer] (NIH) Stroke Scale limb ataxia score 2, ataxia present in two limbs 06/25/2017  . 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 Elevated vitamin B12 level 05/30/2017  . Vitamin D deficiency 05/29/2017  . History of tobacco abuse-  30pk yr hx - quit 04/07/17 05/21/2017  . Gait disturbance, post-stroke 05/14/2017  . Benign essential HTN   . Vitreous hemorrhage of right eye ([place])   . Adjustment disorder with mixed anxiety and depressed mood   . 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 s/p SAH (subarachnoid hemorrhage) ([place]) 04/19/2017  . 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 Hypoxia   . Subarachnoid hemorrhage due to ruptured aneurysm ([place]) 04/07/2017  . CVA (cerebral vascular accident) ([place]) 04/07/2017  . Elevated gastrin level 01/25/2012  . Hypokalemia 01/21/2012  . Nausea & vomiting 01/20/2012  . Epigastric pain 01/20/2012  . Duodenal ulcer, acute with obstruction 10/17/2011  . S/P laparoscopic cholecystectomy 10/16/2011    Alda Lea, PT 11/03/2018, 8:54 PM  [facility] [address] [place], [place], [address] Phone: [phone]   Fax:  [phone]  Name: [name] Barnett MRN: [id number] Date of Birth: 1976/06/01

## 2018-11-05 ENCOUNTER — Ambulatory Visit: Payer: BLUE CROSS/BLUE SHIELD | Admitting: Physical Therapy

## 2018-11-05 ENCOUNTER — Encounter: Payer: Self-pay | Admitting: Physical Therapy

## 2018-11-05 DIAGNOSIS — R278 Other lack of coordination: Secondary | ICD-10-CM

## 2018-11-05 DIAGNOSIS — R2689 Other abnormalities of gait and mobility: Secondary | ICD-10-CM

## 2018-11-05 DIAGNOSIS — R2681 Unsteadiness on feet: Secondary | ICD-10-CM

## 2018-11-05 NOTE — Therapy (Signed)
[facility] [address] [place], [place], [address] Phone: [phone]   Fax:  [phone]  Physical Therapy Treatment  Patient Details  Name: [name] Barnett MRN: [id number] Date of Birth: 02/05/1976 Referring Provider (PT): Edmonia Lynch, MD   Encounter Date: 11/05/2018  PT End of Session - 11/05/18 1446    Visit Number  14    Number of Visits  17    Date for PT Re-Evaluation  11/08/18    Authorization Type  BCBS    PT Start Time  1440    PT Stop Time  1526    PT Time Calculation (min)  46 min    Activity Tolerance  Patient tolerated treatment well    Behavior During Therapy  [facility] for tasks assessed/performed       Past Medical History:  Diagnosis Date  . Duodenal obstruction   . GERD (gastroesophageal reflux disease)   . Hypertension   . Stroke [facility])    april 2018, lt sided weakness  . Trimalleolar fracture of ankle, closed, right, initial encounter     Past Surgical History:  Procedure Laterality Date  . BALLOON DILATION  12/31/2011   Procedure: BALLOON DILATION;  Surgeon: Missy Sabins, MD;  Location: [facility] ENDOSCOPY;  Service: Endoscopy;  Laterality: N/A;  . CHOLECYSTECTOMY  07/28/11  . ESOPHAGOGASTRODUODENOSCOPY  10/17/2011   Procedure: ESOPHAGOGASTRODUODENOSCOPY (EGD);  Surgeon: Missy Sabins, MD;  Location[facility] ENDOSCOPY;  Service: Endoscopy;  Laterality: N/A;  . IR ANGIO INTRA EXTRACRAN SEL INTERNAL CAROTID BILAT MOD SED  04/07/2017  . IR ANGIO INTRA EXTRACRAN SEL INTERNAL CAROTID BILAT MOD SED  01/07/2018  . IR ANGIO VERTEBRAL SEL VERTEBRAL UNI R MOD SED  04/07/2017  . IR ANGIO VERTEBRAL SEL VERTEBRAL UNI R MOD SED  01/07/2018  . IR ANGIOGRAM FOLLOW UP STUDY  04/07/2017  . IR ANGIOGRAM FOLLOW UP STUDY  04/07/2017  . IR ANGIOGRAM FOLLOW UP STUDY  04/07/2017  . IR ANGIOGRAM FOLLOW UP STUDY  04/07/2017  . IR ANGIOGRAM FOLLOW UP STUDY  04/07/2017  . IR ANGIOGRAM SELECTIVE EACH ADDITIONAL VESSEL  04/07/2017  . IR  TRANSCATH/EMBOLIZ  04/07/2017  . IR US GUIDE VASC ACCESS RIGHT  01/07/2018  . ORIF ANKLE FRACTURE Right 07/01/2018   Procedure: RIGHT ANKLE FRACTURE OPEN TREATMENT TRIMALLEOLAR INCLUDES INTERNAL FIXATION WITHOUT FIXATION OF POSTERIOR LIP, FRACTURE CLOSED TREATMENT TRIMALLEOLAR ANKLE WITH MANIPULATION, REPAIR SYNDESMOSIS, REPAIR DELTOID LIGAMENT;  Surgeon: Renette Butters, MD;  Location: [place] [place];  Service: Orthopedics;  Laterality: Right;  . PARS PLANA VITRECTOMY Right 05/01/2017   Procedure: PARS PLANA VITRECTOMY WITH 25 GAUGE RIGHT EYE, endolaser photocoaglation;  Surgeon: Jalene Mullet, MD;  Location: [place];  Service: Ophthalmology;  Laterality: Right;  . RADIOLOGY WITH ANESTHESIA N/A 04/07/2017   Procedure: RADIOLOGY WITH ANESTHESIA;  Surgeon: Consuella Lose, MD;  Location: [place];  Service: Radiology;  Laterality: N/A;  . REPLACEMENT TOTAL KNEE BILATERAL  2004  . TEMPOROMANDIBULAR JOINT SURGERY     2 surgeries  . TUMOR REMOVAL    . VAGOTOMY  01/23/2012   Procedure: VAGOTOMY, antrectomy and BII;  Surgeon: Haywood Lasso, MD;  Location: [place];  Service: General;  Laterality: N/A;  Laparotomy with vagotomy.    There were no vitals filed for this visit.  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  Yes    Pain Score  8     Pain Location  Groin    Pain Orientation  Right    Pain Descriptors / Indicators  Aching;Tightness    Pain Type  Chronic pain    Pain Onset  More than a month ago                       [facility] Adult PT Treatment/Exercise -  11/05/18 0001      Ambulation/Gait   Ambulation/Gait  Yes    Ambulation/Gait Assistance  4: Min guard;4: Min assist    Ambulation/Gait Assistance Details  80 feet with RW; 40 feet without fwd and backward; Patient requriing cueing for step length and upright posturing. Cueing to promote equal weight shift and widen BOS    Assistive device  4-wheeled walker;None    Gait Pattern  Step-through pattern;Decreased stance time - left;Decreased stride length;Ataxic;Lateral hip instability    Ambulation Surface  Level;Indoor      Manual Therapy   Manual Therapy  Joint mobilization;Myofascial release;Soft tissue mobilization;Passive ROM    Manual therapy comments  patient supine with PT propping knee for comfort    Joint Mobilization  attempted long axis distraction at R hip - unable to tolerate pull from ankle - required modified pull from thigh    Soft tissue mobilization  STM to R thigh musculature to patient tolerance - limited    Myofascial Release  manual trigger point release to R lateral thigh and TFL    Passive ROM  attempted stretch of R adductor group - limited tolerance and very guarded; reports pain at adductor tendon group       Trigger Point Dry Needling - 11/05/18 1622    Consent Given?  Yes    Muscles Treated Lower Body  Quadriceps;Tensor fascia lata    Tensor Fascia Lata Response  Twitch response elicited;Palpable increased muscle length    Quadriceps Response  Twitch response elicited;Palpable increased muscle length             PT Short Term Goals - 10/09/18 1607      PT SHORT TERM GOAL #1   Title  Pt will report compliancy with performing initial HEP to further improve independence with functional mobility     Status  Achieved      PT SHORT TERM GOAL #2   Title  Pt will improve gait speed to >/= 2.56 ft/esc indicative of community ambulation with LRAD     Baseline  10/1: 1.96 ft/sec with RW and CGA for safety considerations to 2.13 ft/sec (still with safety  considerations) on 10/07/18    Status  Partially Met      PT SHORT TERM GOAL #3   Title  Pt will improve Berg score to >/= 30/56 indicating decreased fall risk potential     Baseline  10/1: 25/56 indicative of high risk for falls, 33/56 on 10/07/18    Status  Achieved      PT SHORT TERM GOAL #4   Title  Pt will increased TUG score by >/= 3 seconds indicating decreased risk for falls and improvement in functional mobility using LRAD    Baseline  27 secs  with RW first rep, 18.78 secs with RW second RW on 10/07/17    Status  Achieved      PT SHORT TERM GOAL #5   Title  Pt will ambulate 230 feet on level surfaces with LRAD with therapist providing CGA to improve functional mobility     Baseline  met 10/07/18 230' with RW at S level with cues for safety    Status  Achieved      PT SHORT TERM GOAL #6   Title  Patient will perform 12 steps with alternating stepping pattern using B HR's to improve community accessibility with therapist providing min A     Status  Achieved        PT Long Term Goals - 10/09/18 2104      PT LONG TERM GOAL #1   Title  Pt will report compliancy and independence with HEP to further improve functional mobility       PT LONG TERM GOAL #2   Title  Pt will improve Berg score to >/= 36/56 to decrease fall risk potential       PT LONG TERM GOAL #3   Title  Pt will improve gait speed to >/=3.16 ft/sec indicating significant change in functional mobility with LRAD       PT LONG TERM GOAL #4   Title  Pt will improve TUG score to </= 13.5 seconds using LRAD indicating decreased fall risk potential       PT LONG TERM GOAL #6   Title  Pt will report ability to perform household ambulation using her RW with modified independence demonstrating significant improvement in functional mobility       PT LONG TERM GOAL #8   Title  Pt will ambulate over level surfaces with her RW for 230 feet with supervision using LRAD demonstrating improvement in functional mobility              Plan - 11/05/18 1446    Clinical Impression Statement  Session today focusing on manual therapies at R thigh/hip flexor for reduced pain. TDN preformed with good twitch repsonse in rectus femoris. Patient with subjective reports of improved pain following treatment. Gait wiht and without RW with emphasis on upright posturing and reduced pressure applied through UE. Need to address adductor tightness/pain at upcoming sessions. Will continue to progress towards goals.     Rehab Potential  Good    Clinical Impairments Affecting Rehab Potential  high risk for falls due to abnormal gait, ataxia, sensory changes, and impaired cognition.     PT Frequency  2x / week    PT Duration  8 weeks    PT Treatment/Interventions  ADLs/Self Care Home Management;Aquatic Therapy;DME Instruction;Gait training;Stair training;Functional mobility training;Therapeutic activities;Therapeutic exercise;Balance training;Neuromuscular re-education;Patient/family education;Cognitive remediation;Orthotic Fit/Training;Electrical Stimulation;Cryotherapy;Moist Heat;Manual techniques;Passive range of motion;Taping;Energy conservation;Dry needling    PT Next Visit Plan  cont with tall kneeling, balance activities     PT Home Exercise Plan  Access Code: [email or web address]     Consulted and Agree with Plan of Care  Patient;Family member/caregiver    Family Member Consulted  Mother       Patient will benefit from skilled therapeutic intervention in order to improve the following deficits and impairments:  Abnormal gait, Decreased balance, Decreased cognition, Decreased coordination, Decreased strength, Difficulty walking, Impaired sensation, Pain, Decreased activity tolerance, Decreased mobility, Decreased endurance, Postural dysfunction, Decreased knowledge of use of DME, Impaired perceived functional ability  Visit Diagnosis: Other abnormalities of gait and mobility  Unsteadiness on  feet  Other lack of  coordination     Problem List Patient Active Problem List   Diagnosis Date Noted  . Osteoarthritis of right hip 10/07/2018  . 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 Weakness with dizziness-  since [facility] and CVA 04/07/17 08/07/2017  . Disturbances of vision, late effect of stroke 08/06/2017  . Health education/counseling 08/05/2017  . High risk medications (not anticoagulants) long-term use 08/05/2017  . Neuritis-  R sided:  arm and leg/ body due to stroke 08/05/2017  . Elevated LDL cholesterol level 07/11/2017  . [employer] (NIH) Stroke Scale limb ataxia score 2, ataxia present in two limbs 06/25/2017  . 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 Elevated vitamin B12 level 05/30/2017  . Vitamin D deficiency 05/29/2017  . History of tobacco abuse-  30pk yr hx - quit 04/07/17 05/21/2017  . Gait disturbance, post-stroke 05/14/2017  . Benign essential HTN   . Vitreous hemorrhage of right eye ([place])   . Adjustment disorder with mixed anxiety and depressed mood   . 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 s/p SAH (subarachnoid hemorrhage) ([place]) 04/19/2017  . 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 Hypoxia   . Subarachnoid hemorrhage due to ruptured aneurysm ([place]) 04/07/2017  . CVA (cerebral vascular accident) ([place]) 04/07/2017  . Elevated gastrin level 01/25/2012  . Hypokalemia 01/21/2012  . Nausea & vomiting 01/20/2012  . Epigastric pain 01/20/2012  . Duodenal ulcer, acute with obstruction 10/17/2011  . S/P laparoscopic cholecystectomy 10/16/2011     Lanney Gins, PT, DPT Supplemental Physical Therapist 11/05/18 4:30 PM Pager: [phone] Office: [facility] [address] [place], [place], [address] Phone: [phone]   Fax:  [phone]  Name: [name] Barnett MRN: [id number] Date of Birth:  [birth year]

## 2018-11-09 ENCOUNTER — Other Ambulatory Visit: Payer: Self-pay | Admitting: Family Medicine

## 2018-11-09 DIAGNOSIS — I10 Essential (primary) hypertension: Secondary | ICD-10-CM

## 2018-11-10 ENCOUNTER — Ambulatory Visit: Payer: BLUE CROSS/BLUE SHIELD | Attending: Orthopedic Surgery | Admitting: Physical Therapy

## 2018-11-10 DIAGNOSIS — R270 Ataxia, unspecified: Secondary | ICD-10-CM | POA: Insufficient documentation

## 2018-11-10 DIAGNOSIS — R293 Abnormal posture: Secondary | ICD-10-CM | POA: Diagnosis present

## 2018-11-10 DIAGNOSIS — R2689 Other abnormalities of gait and mobility: Secondary | ICD-10-CM | POA: Diagnosis present

## 2018-11-10 DIAGNOSIS — R2681 Unsteadiness on feet: Secondary | ICD-10-CM | POA: Diagnosis present

## 2018-11-10 DIAGNOSIS — R208 Other disturbances of skin sensation: Secondary | ICD-10-CM | POA: Insufficient documentation

## 2018-11-10 DIAGNOSIS — I69054 Hemiplegia and hemiparesis following nontraumatic subarachnoid hemorrhage affecting left non-dominant side: Secondary | ICD-10-CM | POA: Insufficient documentation

## 2018-11-10 DIAGNOSIS — M79651 Pain in right thigh: Secondary | ICD-10-CM | POA: Diagnosis present

## 2018-11-10 DIAGNOSIS — R278 Other lack of coordination: Secondary | ICD-10-CM | POA: Diagnosis present

## 2018-11-10 DIAGNOSIS — R260 Ataxic gait: Secondary | ICD-10-CM | POA: Diagnosis present

## 2018-11-10 NOTE — Therapy (Addendum)
[facility] [address] [place], [place], [address] Phone: [phone]   Fax:  [phone]  Physical Therapy Treatment  Patient Details  Name: [name] Barnett MRN: [id number] Date of Birth: 11/12/1976 Referring Provider (PT): Margarita Ranaimothy Murphy, MD   Encounter Date: 11/10/2018    11/10/18 1621  PT Visits / Re-Eval  Visit Number 15  Number of Visits 31  Date for PT Re-Evaluation 01/09/19  Authorization  Authorization Type BC[employer]  PT Time Calculation  PT Start Time 1531  PT Stop Time 1615  PT Time Calculation (min) 44 min  PT - End of Session  Equipment Utilized During Treatment Gait belt  Activity Tolerance Patient tolerated treatment well  Behavior During Therapy [facility] for tasks assessed/performed   End of session updated by primary PT for recertification: Dierdre HighmanAudra F Potter, PT, DPT 11/16/18    5:31 PM     Past Medical History:  Diagnosis Date  . Duodenal obstruction   . GERD (gastroesophageal reflux disease)   . Hypertension   . Stroke [facility]([name])    april 2018, lt sided weakness  . Trimalleolar fracture of ankle, closed, right, initial encounter     Past Surgical History:  Procedure Laterality Date  . BALLOON DILATION  12/31/2011   Procedure: BALLOON DILATION;  Surgeon: Barrie FolkJohn C Hayes, MD;  [facility];  Service: Endoscopy;  Laterality: N/A;  . CHOLECYSTECTOMY  07/28/11  . ESOPHAGOGASTRODUODENOSCOPY  10/17/2011   Procedure: ESOPHAGOGASTRODUODENOSCOPY (EGD);  Surgeon: Barrie FolkJohn C Hayes, MD;  [facility];  Service: Endoscopy;  Laterality: N/A;  . IR ANGIO INTRA EXTRACRAN SEL INTERNAL CAROTID BILAT MOD SED  04/07/2017  . IR ANGIO INTRA EXTRACRAN SEL INTERNAL CAROTID BILAT MOD SED  01/07/2018  . IR ANGIO VERTEBRAL SEL VERTEBRAL UNI R MOD SED  04/07/2017  . IR ANGIO VERTEBRAL SEL VERTEBRAL UNI R MOD SED  01/07/2018  . IR ANGIOGRAM FOLLOW UP STUDY  04/07/2017  . IR ANGIOGRAM FOLLOW UP STUDY  04/07/2017  . IR  ANGIOGRAM FOLLOW UP STUDY  04/07/2017  . IR ANGIOGRAM FOLLOW UP STUDY  04/07/2017  . IR ANGIOGRAM FOLLOW UP STUDY  04/07/2017  . IR ANGIOGRAM SELECTIVE EACH ADDITIONAL VESSEL  04/07/2017  . IR TRANSCATH/EMBOLIZ  04/07/2017  . IR US GUIDE VASC ACCESS RIGHT  01/07/2018  . ORIF ANKLE FRACTURE Right 07/01/2018   Procedure: RIGHT ANKLE FRACTURE OPEN TREATMENT TRIMALLEOLAR INCLUDES INTERNAL FIXATION WITHOUT FIXATION OF POSTERIOR LIP, FRACTURE CLOSED TREATMENT TRIMALLEOLAR ANKLE WITH MANIPULATION, REPAIR SYNDESMOSIS, REPAIR DELTOID LIGAMENT;  Surgeon: Sheral ApleyMurphy, Timothy D, MD;  Location: [facility];  Service: Orthopedics;  Laterality: Right;  . PARS PLANA VITRECTOMY Right 05/01/2017   Procedure: PARS PLANA VITRECTOMY WITH 25 GAUGE RIGHT EYE, endolaser photocoaglation;  Surgeon: Carmela RimaPatel, Narendra, MD;  [facility];  Service: Ophthalmology;  Laterality: Right;  . RADIOLOGY WITH ANESTHESIA N/A 04/07/2017   Procedure: RADIOLOGY WITH ANESTHESIA;  Surgeon: [name], MD;  [facility];  Service: Radiology;  Laterality: N/A;  . REPLACEMENT TOTAL KNEE BILATERAL  2004  . TEMPOROMANDIBULAR JOINT SURGERY     2 surgeries  . TUMOR REMOVAL    . VAGOTOMY  01/23/2012   Procedure: VAGOTOMY, antrectomy and BII;  Surgeon: [name], MD;  Location: [facility] OR;  Service: General;  Laterality: N/A;  Laparotomy with vagotomy.    There were no vitals filed for this visit.  Subjective Assessment - 11/10/18 1532    Subjective  Pt fell going up her ramp in her roller  chair and hit left elbow and left hip. Pt stated she did not hit her head.    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  Yes    Pain Score  8     Pain Location  Groin     Pain Orientation  Right    Pain Descriptors / Indicators  Aching;Tightness    Pain Type  Chronic pain    Pain Onset  More than a month ago    Pain Frequency  Constant    Multiple Pain Sites  Yes    Pain Score  2    Pain Location  Hip    Pain Orientation  Left    Pain Descriptors / Indicators  Tender    Pain Onset  Yesterday    Pain Frequency  Intermittent         [facility] PT Assessment - 11/10/18 0001      Berg Balance Test   Sit to Stand  Able to stand  independently using hands    Standing Unsupported  Able to stand safely 2 minutes    Sitting with Back Unsupported but Feet Supported on Floor or Stool  Able to sit safely and securely 2 minutes    Stand to Sit  Sits safely with minimal use of hands    Transfers  Able to transfer safely, definite need of hands    Standing Unsupported with Eyes Closed  Able to stand 10 seconds safely    Standing Ubsupported with Feet Together  Able to place feet together independently and stand for 1 minute with supervision    From Standing, Reach Forward with Outstretched Arm  Can reach confidently >25 cm (10")    From Standing Position, Pick up Object from Floor  Able to pick up shoe safely and easily    From Standing Position, Turn to Look Behind Over each Shoulder  Looks behind one side only/other side shows less weight shift    Turn 360 Degrees  Needs assistance while turning    Standing Unsupported, Alternately Place Feet on Step/Stool  Able to complete >2 steps/needs minimal assist    Standing Unsupported, One Foot in Front  Needs help to step but can hold 15 seconds    Standing on One Leg  Tries to lift leg/unable to hold 3 seconds but remains standing independently    Total Score  39    Berg comment:  Pt improved from 33/56 from 10/07/2018          [facility] Adult PT Treatment/Exercise - 11/10/18 0001      Transfers   Transfers  Sit to Stand;Stand to Sit    Sit to Stand  4: Min guard    Sit to Stand Details  Verbal cues for  technique;Verbal cues for precautions/safety;Verbal cues for gait pattern    Stand to Sit  4: Min guard    Stand to Sit Details (indicate cue type and reason)  Verbal cues for technique;Verbal cues for precautions/safety    Number of Reps  Other reps (comment)   4   Comments  Pt demonstrated use of UE when sitting from RW in unsafe manner. Pt also does not control descent and required verbal  cues for safety and technique.      Ambulation/Gait   Ambulation/Gait  Yes    Ambulation/Gait Assistance  4: Min guard    Ambulation/Gait Assistance Details  Min guard due to instability.    Ambulation Distance (Feet)  230 Feet    Assistive device  Rolling walker    Gait Pattern  Step-through pattern;Decreased stance time - left;Decreased stride length;Ataxic;Lateral hip instability    Ambulation Surface  Level;Indoor    Gait velocity  2.73 ft/sec with RW    Gait Comments  Pt ambulated through clinic with atxic gait but demonstrated no LOB.        PT Short Term Goals - 11/16/18 1722      PT SHORT TERM GOAL #1   Title  Pt will report ongoing compliance with HEP and fall prevention recommendations    Time  4    Period  Weeks    Status  New    Target Date  12/10/18      PT SHORT TERM GOAL #2   Title  Pt will improve gait speed to >/= 2.8 ft/esc indicative of community ambulation with LRAD     Baseline  2.73 ft/sec with RW    Time  4    Period  Weeks    Status  Revised    Target Date  12/10/18      PT SHORT TERM GOAL #3   Title  Pt will improve Berg score to >/= 42/56 indicating decreased fall risk potential     Baseline  39/56    Time  4    Period  Weeks    Status  Revised    Target Date  12/10/18      PT SHORT TERM GOAL #4   Title  Pt will increased TUG score to >/= 15 seconds indicating decreased risk for falls and improvement in functional mobility using LRAD    Baseline  20.6 with RW    Time  4    Period  Weeks    Status  Revised    Target Date  12/10/18      PT SHORT TERM  GOAL #5   Title  Pt will report </= 5/10 pain in R hip during exercises, WB and gait    Time  4    Period  Weeks    Status  New    Target Date  12/10/18      PT SHORT TERM GOAL #6   Title  Patient will perform 12 steps with alternating stepping pattern using B HR's to improve community accessibility with close supervision    Time  4    Period  Weeks    Status  Revised    Target Date  12/10/18       PT Long Term Goals - 11/16/18 1727      PT LONG TERM GOAL #1   Title  Pt will demonstrate independence with land and aquatic HEP    Time  8    Period  Weeks    Status  New    Target Date  01/09/19      PT LONG TERM GOAL #2   Title  Pt will improve Berg score to >/= 46/56 to decrease fall risk potential     Time  8    Period  Weeks    Status  Revised    Target Date  01/09/19      PT LONG TERM GOAL #3   Title  Pt  will improve gait speed to >/=3.0 ft/sec indicating significant change in functional mobility with LRAD     Time  8    Period  Weeks    Status  Revised    Target Date  01/09/19      PT LONG TERM GOAL #4   Title  Pt will improve TUG score to </= 13.5 seconds using LRAD indicating decreased fall risk potential     Time  8    Period  Weeks    Status  New    Target Date  01/09/19      PT LONG TERM GOAL #6   Title  Pt will report ability to perform household ambulation using her RW with modified independence demonstrating significant improvement in functional mobility     Time  8    Period  Weeks    Status  Revised    Target Date  01/09/19      PT LONG TERM GOAL #7   Title  Pt will ambulate outdoors over paved surfaces and perform curbs and inclines with supervision using LRAD for 300 feet to improve functional mobility with community distances     Time  8    Period  Weeks    Status  Revised    Target Date  01/09/19      Goals updated by primary PT for recertification: Dierdre Highman, PT, DPT 11/16/18    5:31 PM           11/10/18 1621  Plan   Clinical Impression Statement Session today focused on checking LTGs. Pt reached Berg balance goal of >36 but recieving 39/56. Pt demonstrated improved gait speed and TUG score. Pt would benfit from further physical therapy to increase balance and gait deficits.  Pt will benefit from skilled therapeutic intervention in order to improve on the following deficits Abnormal gait;Decreased balance;Decreased cognition;Decreased coordination;Decreased strength;Difficulty walking;Impaired sensation;Pain;Decreased activity tolerance;Decreased mobility;Decreased endurance;Postural dysfunction;Decreased knowledge of use of DME;Impaired perceived functional ability;Decreased range of motion;Impaired flexibility  Rehab Potential Good  Clinical Impairments Affecting Rehab Potential high risk for falls due to abnormal gait, ataxia, sensory changes, and impaired cognition.   PT Frequency 2x / week  PT Duration 8 weeks  PT Treatment/Interventions ADLs/Self Care Home Management;Aquatic Therapy;DME Instruction;Gait training;Stair training;Functional mobility training;Therapeutic activities;Therapeutic exercise;Balance training;Neuromuscular re-education;Patient/family education;Cognitive remediation;Orthotic Fit/Training;Electrical Stimulation;Cryotherapy;Moist Heat;Manual techniques;Passive range of motion;Taping;Energy conservation;Dry needling  PT Next Visit Plan Check remainder of LTGs  PT Home Exercise Plan Access Code: [email or web address]   Consulted and Agree with Plan of Care Patient;Family member/caregiver  Family Member Consulted Spouse   Plan of care updated by primary PT for recertification: Dierdre Highman, PT, DPT 11/16/18    5:32 PM     Patient will benefit from skilled therapeutic intervention in order to improve the following deficits and impairments:  Abnormal gait, Decreased balance, Decreased cognition, Decreased coordination, Decreased strength, Difficulty walking, Impaired sensation, Pain, Decreased  activity tolerance, Decreased mobility, Decreased endurance, Postural dysfunction, Decreased knowledge of use of DME, Impaired perceived functional ability  Visit Diagnosis: Unsteadiness on feet  Other lack of coordination  Other abnormalities of gait and mobility  Ataxic gait  Pain in right thigh     Problem List Patient Active Problem List   Diagnosis Date Noted  . Osteoarthritis of right hip 10/07/2018  . 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 Weakness with dizziness-  since [facility] and CVA 04/07/17 08/07/2017  . Disturbances of vision, late effect of stroke 08/06/2017  . Health education/counseling 08/05/2017  .  High risk medications (not anticoagulants) long-term use 08/05/2017  . Neuritis-  R sided:  arm and leg/ body due to stroke 08/05/2017  . Elevated LDL cholesterol level 07/11/2017  . [employer] (NIH) Stroke Scale limb ataxia score 2, ataxia present in two limbs 06/25/2017  . 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 Elevated vitamin B12 level 05/30/2017  . Vitamin D deficiency 05/29/2017  . History of tobacco abuse-  30pk yr hx - quit 04/07/17 05/21/2017  . Gait disturbance, post-stroke 05/14/2017  . Benign essential HTN   . Vitreous hemorrhage of right eye ([name])   . Adjustment disorder with mixed anxiety and depressed mood   . 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 s/p SAH (subarachnoid hemorrhage) ([name]) 04/19/2017  . Basilar artery aneurysm ([name])   . Hypoxia   . 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 CVA (cerebral vascular accident) ([name]) 04/07/2017  . Elevated gastrin level 01/25/2012  . Hypokalemia 01/21/2012  . Nausea & vomiting 01/20/2012  . Epigastric pain 01/20/2012  . Duodenal ulcer, acute with obstruction 10/17/2011  . S/P laparoscopic cholecystectomy 10/16/2011    Mellissa Kohut SPTA 11/10/2018, 4:26 PM  [facility] [address] [place], [place], [address] Phone: [phone]   Fax:  [phone]  Name: [name] Barnett MRN: [id number] Date of Birth: 29-Apr-1976

## 2018-11-11 ENCOUNTER — Telehealth: Payer: Self-pay | Admitting: Family Medicine

## 2018-11-11 NOTE — Telephone Encounter (Signed)
-----   Message from [place] CraneMelissa D Pulliam, CMA sent at 11/11/2018 11:23 AM EST ----- Patient is due for month follow up, please call the patient to make an appointment. 30 days of medication sent to pharmacy.  Thanks. MPulliam, CMA/RT(R)

## 2018-11-11 NOTE — Telephone Encounter (Signed)
Left patient message to contact office to set up (provider required OV for Rx refills)--glh

## 2018-11-12 ENCOUNTER — Ambulatory Visit: Payer: BLUE CROSS/BLUE SHIELD | Admitting: Occupational Therapy

## 2018-11-12 ENCOUNTER — Encounter: Payer: Self-pay | Admitting: Occupational Therapy

## 2018-11-12 DIAGNOSIS — R2681 Unsteadiness on feet: Secondary | ICD-10-CM

## 2018-11-12 DIAGNOSIS — R270 Ataxia, unspecified: Secondary | ICD-10-CM

## 2018-11-12 DIAGNOSIS — I69054 Hemiplegia and hemiparesis following nontraumatic subarachnoid hemorrhage affecting left non-dominant side: Secondary | ICD-10-CM

## 2018-11-12 DIAGNOSIS — R278 Other lack of coordination: Secondary | ICD-10-CM

## 2018-11-12 DIAGNOSIS — R293 Abnormal posture: Secondary | ICD-10-CM

## 2018-11-12 DIAGNOSIS — R208 Other disturbances of skin sensation: Secondary | ICD-10-CM

## 2018-11-12 NOTE — Therapy (Signed)
[facility] [address] [place], [place], [address] Phone: [phone]   Fax:  [phone]  Occupational Therapy Treatment  Patient Details  Name: [name] Barnett MRN: [id number] Date of Birth: 11/10/1976 Referring Provider (OT): Dr. Alysia Penna   Encounter Date: 11/12/2018  OT End of Session - 11/12/18 2143    Visit Number  27    Number of Visits  37    Date for OT Re-Evaluation  01/07/19   date adjusted as pt is just now returning for first session after evaluation   Authorization Type  [employer], no visit limit/auth req.    OT Start Time  1503    OT Stop Time  1550    OT Time Calculation (min)  47 min    Activity Tolerance  Patient tolerated treatment well       Past Medical History:  Diagnosis Date  . Duodenal obstruction   . GERD (gastroesophageal reflux disease)   . Hypertension   . Stroke [facility])    april 2018, lt sided weakness  . Trimalleolar fracture of ankle, closed, right, initial encounter     Past Surgical History:  Procedure Laterality Date  . BALLOON DILATION  12/31/2011   Procedure: BALLOON DILATION;  Surgeon: Missy Sabins, MD;  Location: [facility] ENDOSCOPY;  Service: Endoscopy;  Laterality: N/A;  . CHOLECYSTECTOMY  07/28/11  . ESOPHAGOGASTRODUODENOSCOPY  10/17/2011   Procedure: ESOPHAGOGASTRODUODENOSCOPY (EGD);  Surgeon: Missy Sabins, MD;  Location: [facility] ENDOSCOPY;  Service: Endoscopy;  Laterality: N/A;  . IR ANGIO INTRA EXTRACRAN SEL INTERNAL CAROTID BILAT MOD SED  04/07/2017  . IR ANGIO INTRA EXTRACRAN SEL INTERNAL CAROTID BILAT MOD SED  01/07/2018  . IR ANGIO VERTEBRAL SEL VERTEBRAL UNI R MOD SED  04/07/2017  . IR ANGIO VERTEBRAL SEL VERTEBRAL UNI R MOD SED  01/07/2018  . IR ANGIOGRAM FOLLOW UP STUDY  04/07/2017  . IR ANGIOGRAM FOLLOW UP STUDY  04/07/2017  . IR ANGIOGRAM FOLLOW UP STUDY  04/07/2017  . IR ANGIOGRAM FOLLOW UP STUDY  04/07/2017  . IR ANGIOGRAM FOLLOW UP STUDY  04/07/2017  . IR ANGIOGRAM  SELECTIVE EACH ADDITIONAL VESSEL  04/07/2017  . IR TRANSCATH/EMBOLIZ  04/07/2017  . IR US GUIDE VASC ACCESS RIGHT  01/07/2018  . ORIF ANKLE FRACTURE Right 07/01/2018   Procedure: RIGHT ANKLE FRACTURE OPEN TREATMENT TRIMALLEOLAR INCLUDES INTERNAL FIXATION WITHOUT FIXATION OF POSTERIOR LIP, FRACTURE CLOSED TREATMENT TRIMALLEOLAR ANKLE WITH MANIPULATION, REPAIR SYNDESMOSIS, REPAIR DELTOID LIGAMENT;  Surgeon: Renette Butters, MD;  Location: [facility];  Service: Orthopedics;  Laterality: Right;  . PARS PLANA VITRECTOMY Right 05/01/2017   Procedure: PARS PLANA VITRECTOMY WITH 25 GAUGE RIGHT EYE, endolaser photocoaglation;  Surgeon: Jalene Mullet, MD;  Location: [facility];  Service: Ophthalmology;  Laterality: Right;  . RADIOLOGY WITH ANESTHESIA N/A 04/07/2017   Procedure: RADIOLOGY WITH ANESTHESIA;  Surgeon: Consuella Lose, MD;  Location: [facility];  Service: Radiology;  Laterality: N/A;  . REPLACEMENT TOTAL KNEE BILATERAL  2004  . TEMPOROMANDIBULAR JOINT SURGERY     2 surgeries  . TUMOR REMOVAL    . VAGOTOMY  01/23/2012   Procedure: VAGOTOMY, antrectomy and BII;  Surgeon: Haywood Lasso, MD;  Location: [facility];  Service: General;  Laterality: N/A;  Laparotomy with vagotomy.    There were no vitals filed for this visit.  Subjective Assessment - 11/12/18 2139    Subjective   I am not sure I want to do this aquatic therapy but I will try  it and see.    Patient is accompained by:  Family member   mom   Pertinent History  Subarachnoid hemorrhage due to ruptured aneurysm, ataxia (L cerebellar); HTN, R side decr sensation, vertical diplopia/visual deficits, ataxia, Bilateral retinal hemorhage associated with SAH (with surgery on R eye in hospital and L approx 1 month ago)    Limitations  visual deficits, fall risk, impulsivity/cognitive deficits    Patient Stated Goals  I want to be able to walk without assistance so I can do all the things I need to do as a wife and mom, use my hand better.      Currently in Pain?  No/denies         Patient seen for aquatic therapy today.  Treatment took place in water 2.5-4 feet deep depending upon activity.  Pt entered the pool via steps using bilateral hand rails and min a.  Pt needs cues for safe descent on stairs.  Pt is participating in aquatic therapy today for first time.  Pt stated that she was not sure that she wanted to do this however stated she was willing to try. By end of session pt stated that "this was cool" and expressed desire to continue.  Began basic education about water principles so that pt will understand differences between land and water therapy as well as in prep for aquatic HEP so that pt can adjust/modify HEP as desired.  Given cognitive deficits pt will benefit from repetition and application of information therefore will provide in ongoing fashion.  Treatment focused first on static balance with BUE support on large noodle to provide closed chain and proprioceptive input to pt regarding where she is in space.  Progressed to dynamic standing balance via functional ambulation using large noodle for UE support and min - mod assist.  Pt is very impulsive and demonstrates poor postural alignment with poor awareness of body in space.  Hydrostatic pressure and refraction will hopefully facilitate improve awareness with repetition. Addressed simple forward and backward weight shift from LLE to RLE with bilateral UE support. Pt has improved control with closed chain loop including UE's.  Also addressed forward and backward walking along pool wall with UE support to encourage improved trunk alignment with functional mobility.  Pt requires min a for alignment and control.  Pt also requires max cues to slow rate of mobility down and to focus on controlled movements in the water.  Pt with great difficulty monitoring step length.  Addressed engagement of core, UE's and LE's using heavy concept in deeper water using squats with UE support on pool  wall - pt again needed max cues and mod facilitation for alignment and control.  Pt exited pool via steps and bilateral railings as well as min a.  Pt easily becomes frustrated however is able to monitor this and re-engage in task with encouragement.                      OT Short Term Goals - 11/12/18 2140      OT SHORT TERM GOAL #1   Title  Pt will verbalize understanding of basic water principles in prep for aquatic HEP - 12/10/2018    Status  New      OT SHORT TERM GOAL #7   Status  --   inconsistent.  01/09/18 met at approx this level       OT Long Term Goals - 11/12/18 2140      OT LONG  TERM GOAL #1   Title  Pt and family willl be mod I with aquatic HEP (for use when pt has access to pool) - 12/11/27/2020 (date adjusted as pt is just now returning after evaluation for first treatment session)    Status  On-going      OT LONG TERM GOAL #2   Title  Pt will demonstrate improved postural alignment and control in standing as demonstrated by ability to do light home mgmt tasks in standing (such as fold laundry)    Status  On-going      OT LONG TERM GOAL #3   Title  Pt will report greater ease in using LUE for simple home mgmt tasks (such as unloading dishwasher into [employer]) .     Status  On-going      OT LONG TERM GOAL #4   Title  Pt will be mod I using RW during simple home mgmt tasks at ambulatory level.      Status  On-going            Plan - 11/12/18 2141    Clinical Impression Statement  Pt participated in first session today since evaluation.  Pt initially was not sure if she wanted to do aquatic therapy however at the end of the session stated "this is pretty cool I want to keep coming."    Occupational Profile and client history currently impacting functional performance  Pt is a 42 y.o. female s/p subarachnoid hemorrhage due to ruptured aneurysm, L cerebellar infarct with ataxia.  Pt was working full time and indpendent prior to  hospitalization.  Pt lives with husband and cared for 47 y.o. daughter.  Medical history includes HTN, GERD, and Bilateral retinal hemorhage associated with SAH (needed surgery).      Occupational performance deficits (Please refer to evaluation for details):  ADL's;IADL's;Leisure;Social Participation;Work    Neurosurgeon    Current Impairments/barriers affecting progress:  cognitive deficits, impulsivity    OT Frequency  1x / week    OT Duration  8 weeks    OT Treatment/Interventions  Self-care/ADL training;Therapeutic exercise;Therapeutic activities;Cognitive remediation/compensation;Passive range of motion;Functional Mobility Training;Neuromuscular education;Energy conservation;Manual Therapy;Patient/family education;Balance training;Aquatic Therapy    Plan  aquatic therapy treatment relative to balance, postural alignment and control, functional ambulation, UE and LE strengthening    Consulted and Agree with Plan of Care  Patient;Family member/caregiver    Family Member Consulted  mother       Patient will benefit from skilled therapeutic intervention in order to improve the following deficits and impairments:  Decreased coordination, Decreased range of motion, Difficulty walking, Abnormal gait, Decreased safety awareness, Impaired sensation, Decreased knowledge of precautions, Decreased balance, Decreased knowledge of use of DME, Impaired UE functional use, Decreased cognition, Decreased mobility, Decreased strength, Impaired vision/preception, Impaired perceived functional ability  Visit Diagnosis: Unsteadiness on feet - Plan: Ot plan of care cert/re-cert  Other lack of coordination - Plan: Ot plan of care cert/re-cert  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 Osteoarthritis of right hip 10/07/2018  . 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 Weakness with dizziness-  since [facility] and CVA 04/07/17 08/07/2017  .  Disturbances of vision, late effect of stroke 08/06/2017  . Health education/counseling 08/05/2017  . High risk medications (not anticoagulants) long-term use 08/05/2017  . Neuritis-  R sided:  arm and leg/ body due to stroke 08/05/2017  . Elevated LDL cholesterol level 07/11/2017  . [employer] (NIH) Stroke Scale limb ataxia score 2, ataxia present in two limbs 06/25/2017  . 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 Elevated vitamin B12 level 05/30/2017  . Vitamin D deficiency 05/29/2017  . History of tobacco abuse-  30pk yr hx - quit 04/07/17 05/21/2017  . Gait disturbance, post-stroke 05/14/2017  . Benign essential HTN   . Vitreous hemorrhage of right eye ([place])   . Adjustment disorder with mixed anxiety and depressed mood   . 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 s/p SAH (subarachnoid hemorrhage) ([place]) 04/19/2017  . 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 Hypoxia   . Subarachnoid hemorrhage due to ruptured aneurysm ([place]) 04/07/2017  . CVA (cerebral vascular accident) ([place]) 04/07/2017  . Elevated gastrin level 01/25/2012  . Hypokalemia 01/21/2012  . Nausea & vomiting 01/20/2012  . Epigastric pain 01/20/2012  . Duodenal ulcer, acute with obstruction 10/17/2011  . S/P laparoscopic cholecystectomy 10/16/2011    Quay Burow, OTR/L 11/12/2018, 9:52 PM  [facility] [address] [place], [place], [address] Phone: [phone]   Fax:  [phone]  Name: [name] Barnett MRN: [id number] Date of Birth: 1976-08-18

## 2018-11-14 ENCOUNTER — Ambulatory Visit: Payer: BLUE CROSS/BLUE SHIELD | Admitting: Physical Therapy

## 2018-11-16 NOTE — Addendum Note (Signed)
Addended by: Dierdre HighmanPOTTER, Rai Severns F on: 11/16/2018 05:36 PM   Modules accepted: Orders

## 2018-11-17 ENCOUNTER — Ambulatory Visit: Payer: BLUE CROSS/BLUE SHIELD | Admitting: Physical Therapy

## 2018-11-17 DIAGNOSIS — R2681 Unsteadiness on feet: Secondary | ICD-10-CM | POA: Diagnosis not present

## 2018-11-17 DIAGNOSIS — R2689 Other abnormalities of gait and mobility: Secondary | ICD-10-CM

## 2018-11-17 DIAGNOSIS — R278 Other lack of coordination: Secondary | ICD-10-CM

## 2018-11-17 NOTE — Therapy (Signed)
[facility] [address] [place], [place], [address] Phone: [phone]   Fax:  [phone]  Physical Therapy Treatment  Patient Details  Name: [name] Barnett MRN: [id number] Date of Birth: [birth year] Referring Provider (PT): Edmonia Lynch, MD   Encounter Date: 11/17/2018  PT End of Session - 11/17/18 2142    Visit Number  16    Number of Visits  31    Date for PT Re-Evaluation  01/09/19    Authorization Type  BCBS    PT Start Time  3818    PT Stop Time  1619    PT Time Calculation (min)  44 min       Past Medical History:  Diagnosis Date  . Duodenal obstruction   . GERD (gastroesophageal reflux disease)   . Hypertension   . Stroke [facility])    april 2018, lt sided weakness  . Trimalleolar fracture of ankle, closed, right, initial encounter     Past Surgical History:  Procedure Laterality Date  . BALLOON DILATION  12/31/2011   Procedure: BALLOON DILATION;  Surgeon: Missy Sabins, MD;  Location: [facility] ENDOSCOPY;  Service: Endoscopy;  Laterality: N/A;  . CHOLECYSTECTOMY  07/28/11  . ESOPHAGOGASTRODUODENOSCOPY  10/17/2011   Procedure: ESOPHAGOGASTRODUODENOSCOPY (EGD);  Surgeon: Missy Sabins, MD;  Location: [facility] ENDOSCOPY;  Service: Endoscopy;  Laterality: N/A;  . IR ANGIO INTRA EXTRACRAN SEL INTERNAL CAROTID BILAT MOD SED  04/07/2017  . IR ANGIO INTRA EXTRACRAN SEL INTERNAL CAROTID BILAT MOD SED  01/07/2018  . IR ANGIO VERTEBRAL SEL VERTEBRAL UNI R MOD SED  04/07/2017  . IR ANGIO VERTEBRAL SEL VERTEBRAL UNI R MOD SED  01/07/2018  . IR ANGIOGRAM FOLLOW UP STUDY  04/07/2017  . IR ANGIOGRAM FOLLOW UP STUDY  04/07/2017  . IR ANGIOGRAM FOLLOW UP STUDY  04/07/2017  . IR ANGIOGRAM FOLLOW UP STUDY  04/07/2017  . IR ANGIOGRAM FOLLOW UP STUDY  04/07/2017  . IR ANGIOGRAM SELECTIVE EACH ADDITIONAL VESSEL  04/07/2017  . IR TRANSCATH/EMBOLIZ  04/07/2017  . IR US GUIDE VASC ACCESS RIGHT  01/07/2018  . ORIF ANKLE FRACTURE Right 07/01/2018   Procedure: RIGHT ANKLE FRACTURE OPEN TREATMENT TRIMALLEOLAR INCLUDES INTERNAL FIXATION WITHOUT FIXATION OF POSTERIOR LIP, FRACTURE CLOSED TREATMENT TRIMALLEOLAR ANKLE WITH MANIPULATION, REPAIR SYNDESMOSIS, REPAIR DELTOID LIGAMENT;  Surgeon: Renette Butters, MD;  Location: [facility];  Service: Orthopedics;  Laterality: Right;  . PARS PLANA VITRECTOMY Right 05/01/2017   Procedure: PARS PLANA VITRECTOMY WITH 25 GAUGE RIGHT EYE, endolaser photocoaglation;  Surgeon: Jalene Mullet, MD;  Location: [facility];  Service: Ophthalmology;  Laterality: Right;  . RADIOLOGY WITH ANESTHESIA N/A 04/07/2017   Procedure: RADIOLOGY WITH ANESTHESIA;  Surgeon: Consuella Lose, MD;  Location: [place];  Service: Radiology;  Laterality: N/A;  . REPLACEMENT TOTAL KNEE BILATERAL  2004  . TEMPOROMANDIBULAR JOINT SURGERY     2 surgeries  . TUMOR REMOVAL    . VAGOTOMY  01/23/2012   Procedure: VAGOTOMY, antrectomy and BII;  Surgeon: Haywood Lasso, MD;  Location: [place];  Service: General;  Laterality: N/A;  Laparotomy with vagotomy.    There were no vitals filed for this visit.  Subjective Assessment - 11/17/18 2133    Subjective  Pt reports she liked the aquatic therapy - plans on continuing with it    Patient is accompained by:  Family member    Pertinent History  Ataxia post stroke (April 2018), Duodenal obstruction, HTN, GERD    Limitations  Lifting;Standing;Walking;House hold  activities    Patient Stated Goals  Pt reports goal of walking with no AD and improve balance to reduce fall risk    Currently in Pain?  Yes    Pain Score  3     Pain Location  Leg   proximal quad   Pain Orientation  Right    Pain Descriptors / Indicators  Aching;Dull;Throbbing    Pain Type  Chronic pain    Pain Onset  More than a month ago    Pain Frequency  Constant                       [facility] Adult PT Treatment/Exercise - 11/17/18 1547      Transfers   Transfers  Sit to Stand;Stand to Sit    Sit to  Stand  4: Min guard    Sit to Stand Details  Verbal cues for technique;Verbal cues for precautions/safety;Verbal cues for gait pattern    Stand to Sit  5: Supervision    Number of Reps  Other reps (comment)   5   Comments  1 UE support used from mat with sit to stand      Ambulation/Gait   Ambulation/Gait  Yes    Ambulation/Gait Assistance  4: Min guard    Ambulation/Gait Assistance Details  min guard due to ataxic gait pattern    Ambulation Distance (Feet)  115 Feet    Assistive device  Rolling walker    Gait Pattern  Step-through pattern;Decreased stance time - left;Decreased stride length;Ataxic;Lateral hip instability    Ambulation Surface  Level;Indoor    Stairs  Yes    Stairs Assistance  4: Min guard    Stair Management Technique  Two rails;Alternating pattern;Forwards    Number of Stairs  4   3 reps for total of 12 steps   Height of Stairs  6          Balance Exercises - 11/17/18 2138      Balance Exercises: Standing   Standing Eyes Opened  Wide (BOA);Head turns;Solid surface;5 reps    Sidestepping  Upper extremity support;3 reps   inside // bars    Step Over Hurdles / Cones  stepping over black balance beam inside // bars 5 reps each leg with UE support     Other Standing Exercises  Pt performed marching in place with bil. UE support on // bars 10 reps each leg; crossovers 2 reps each inside // bars - RLE crossing in front of LLE; then LLE crossing in front of RLE      Tall kneeling on blue mat on floor; head turns with EO and then with EC 5 reps each; Modified quadruped with pt leaning on Mat table - pt performed hip extension 5 reps each leg Rt 1/2 kneeling with RUE support on PT's leg for assist with balance;  Lt 1/2 kneeling with UE support on PT's leg with mod assist For stabilization; pt had more difficulty maintaining balance in Rt 1/2 kneeling position than in Lt 1/2 kneeling position Pt performed amb. On knees 2 reps on blue mat with UE support prn for  recovery of LOB  Pt performed small stepping alternating up/back and laterally 5 reps each foot each direction inside // bars with UE support prn with CGA With cues for small, controlled movement   PT Short Term Goals - 11/16/18 1722      PT SHORT TERM GOAL #1   Title  Pt will report  ongoing compliance with HEP and fall prevention recommendations    Time  4    Period  Weeks    Status  New    Target Date  12/10/18      PT SHORT TERM GOAL #2   Title  Pt will improve gait speed to >/= 2.8 ft/esc indicative of community ambulation with LRAD     Baseline  2.73 ft/sec with RW    Time  4    Period  Weeks    Status  Revised    Target Date  12/10/18      PT SHORT TERM GOAL #3   Title  Pt will improve Berg score to >/= 42/56 indicating decreased fall risk potential     Baseline  39/56    Time  4    Period  Weeks    Status  Revised    Target Date  12/10/18      PT SHORT TERM GOAL #4   Title  Pt will increased TUG score to >/= 15 seconds indicating decreased risk for falls and improvement in functional mobility using LRAD    Baseline  20.6 with RW    Time  4    Period  Weeks    Status  Revised    Target Date  12/10/18      PT SHORT TERM GOAL #5   Title  Pt will report </= 5/10 pain in R hip during exercises, WB and gait    Time  4    Period  Weeks    Status  New    Target Date  12/10/18      PT SHORT TERM GOAL #6   Title  Patient will perform 12 steps with alternating stepping pattern using B HR's to improve community accessibility with close supervision    Time  4    Period  Weeks    Status  Revised    Target Date  12/10/18        PT Long Term Goals - 11/17/18 2142      PT LONG TERM GOAL #1   Title  Pt will demonstrate independence with land and aquatic HEP      PT LONG TERM GOAL #2   Title  Pt will improve Berg score to >/= 46/56 to decrease fall risk potential     Baseline  39/56 11/10/2018      PT LONG TERM GOAL #3   Title  Pt will improve gait speed to >/=3.0  ft/sec indicating significant change in functional mobility with LRAD     Baseline  Pt 2.73 ft/sec with RW 11/10/2018, improved just not to goal level      PT LONG TERM GOAL #4   Title  Pt will improve TUG score to </= 13.5 seconds using LRAD indicating decreased fall risk potential     Baseline  Pt performed with RW in 20.6 sec, improved just not to goal      PT LONG TERM GOAL #5   Title  Pt will negotiate 12 steps using B HR's and alternating stepping technique with supervision to improve community accessibility     Baseline  met 11-17-18    Status  Achieved      PT LONG TERM GOAL #6   Title  Pt will report ability to perform household ambulation using her RW with modified independence demonstrating significant improvement in functional mobility     Baseline  Reports intermittent use of rolling chair; and use of RW  dpending on the day.      PT LONG TERM GOAL #7   Title  Pt will ambulate outdoors over paved surfaces and perform curbs and inclines with supervision using LRAD for 300 feet to improve functional mobility with community distances       PT LONG TERM GOAL #8   Title  Pt will ambulate over level surfaces with her RW for 230 feet with supervision using LRAD demonstrating improvement in functional mobility             Plan - 11/17/18 2144    Clinical Impression Statement  Pt met LTG #5 (step negotiation with use of hand rails):  LLE continues to be more impaired than RLE with greater weakness and lack of coordination in LLE than in RLE.  Pt requires UE support with 1/2 kneeling activities on each leg, due to decreased core stabilization.      Rehab Potential  Good    Clinical Impairments Affecting Rehab Potential  high risk for falls due to abnormal gait, ataxia, sensory changes, and impaired cognition.     PT Frequency  2x / week    PT Duration  8 weeks    PT Treatment/Interventions  ADLs/Self Care Home Management;Aquatic Therapy;DME Instruction;Gait training;Stair  training;Functional mobility training;Therapeutic activities;Therapeutic exercise;Balance training;Neuromuscular re-education;Patient/family education;Cognitive remediation;Orthotic Fit/Training;Electrical Stimulation;Cryotherapy;Moist Heat;Manual techniques;Passive range of motion;Taping;Energy conservation;Dry needling    PT Next Visit Plan  Dry needling prn for Rt quad pain; if determined not needed, cont to work on balance, gait and trunk stabilization activities     PT Home Exercise Plan  Access Code: [email or web address]     Consulted and Agree with Plan of Care  Patient;Family member/caregiver    Family Member Consulted  Spouse       Patient will benefit from skilled therapeutic intervention in order to improve the following deficits and impairments:  Abnormal gait, Decreased balance, Decreased cognition, Decreased coordination, Decreased strength, Difficulty walking, Impaired sensation, Pain, Decreased activity tolerance, Decreased mobility, Decreased endurance, Postural dysfunction, Decreased knowledge of use of DME, Impaired perceived functional ability, Decreased range of motion, Impaired flexibility  Visit Diagnosis: Unsteadiness on feet  Other lack of coordination  Other abnormalities of gait and mobility     Problem List Patient Active Problem List   Diagnosis Date Noted  . Osteoarthritis of right hip 10/07/2018  . 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 Weakness with dizziness-  since [facility] and CVA 04/07/17 08/07/2017  . Disturbances of vision, late effect of stroke 08/06/2017  . Health education/counseling 08/05/2017  . High risk medications (not anticoagulants) long-term use 08/05/2017  . Neuritis-  R sided:  arm and leg/ body due to stroke 08/05/2017  . Elevated LDL cholesterol level 07/11/2017  . [employer] (NIH) Stroke Scale limb ataxia score 2, ataxia present in two limbs 06/25/2017  . 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 Elevated vitamin  B12 level 05/30/2017  . Vitamin D deficiency 05/29/2017  . History of tobacco abuse-  30pk yr hx - quit 04/07/17 05/21/2017  . Gait disturbance, post-stroke 05/14/2017  . Benign essential HTN   . Vitreous hemorrhage of right eye ([place])   . Adjustment disorder with mixed anxiety and depressed mood   . 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 s/p SAH (subarachnoid hemorrhage) ([place]) 04/19/2017  . 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 Hypoxia   . Subarachnoid hemorrhage due to ruptured aneurysm (  [place]) 04/07/2017  . CVA (cerebral vascular accident) ([place]) 04/07/2017  . Elevated gastrin level 01/25/2012  . Hypokalemia 01/21/2012  . Nausea & vomiting 01/20/2012  . Epigastric pain 01/20/2012  . Duodenal ulcer, acute with obstruction 10/17/2011  . S/P laparoscopic cholecystectomy 10/16/2011    Alda Lea, PT 11/17/2018, 9:51 PM  [facility] [address] [place], [place], [address] Phone: [phone]   Fax:  [phone]  Name: TRICA USERY MRN: [id number] Date of Birth: 1976/09/15

## 2018-11-19 ENCOUNTER — Encounter: Payer: Self-pay | Admitting: Occupational Therapy

## 2018-11-19 ENCOUNTER — Ambulatory Visit: Payer: BLUE CROSS/BLUE SHIELD | Admitting: Occupational Therapy

## 2018-11-19 DIAGNOSIS — R270 Ataxia, unspecified: Secondary | ICD-10-CM

## 2018-11-19 DIAGNOSIS — I69054 Hemiplegia and hemiparesis following nontraumatic subarachnoid hemorrhage affecting left non-dominant side: Secondary | ICD-10-CM

## 2018-11-19 DIAGNOSIS — R208 Other disturbances of skin sensation: Secondary | ICD-10-CM

## 2018-11-19 DIAGNOSIS — R293 Abnormal posture: Secondary | ICD-10-CM

## 2018-11-19 DIAGNOSIS — R2681 Unsteadiness on feet: Secondary | ICD-10-CM

## 2018-11-19 DIAGNOSIS — R278 Other lack of coordination: Secondary | ICD-10-CM

## 2018-11-19 NOTE — Therapy (Signed)
[facility] [address] [place], [place], [address] Phone: [phone]   Fax:  [phone]  Occupational Therapy Treatment  Patient Details  Name: [name] Barnett MRN: [id number] Date of Birth: [birth year] Referring Provider (OT): Dr. Alysia Penna   Encounter Date: 11/19/2018  OT End of Session - 11/19/18 2137    Visit Number  50    Number of Visits  80    Date for OT Re-Evaluation  01/07/19    Authorization Type  BCBS, no visit limit/auth req.    OT Start Time  1502    OT Stop Time  1550    OT Time Calculation (min)  48 min    Activity Tolerance  Patient tolerated treatment well       Past Medical History:  Diagnosis Date  . Duodenal obstruction   . GERD (gastroesophageal reflux disease)   . Hypertension   . Stroke [facility])    april 2018, lt sided weakness  . Trimalleolar fracture of ankle, closed, right, initial encounter     Past Surgical History:  Procedure Laterality Date  . BALLOON DILATION  12/31/2011   Procedure: BALLOON DILATION;  Surgeon: Missy Sabins, MD;  Location: [facility] ENDOSCOPY;  Service: Endoscopy;  Laterality: N/A;  . CHOLECYSTECTOMY  07/28/11  . ESOPHAGOGASTRODUODENOSCOPY  10/17/2011   Procedure: ESOPHAGOGASTRODUODENOSCOPY (EGD);  Surgeon: Missy Sabins, MD;  Location: [facility] ENDOSCOPY;  Service: Endoscopy;  Laterality: N/A;  . IR ANGIO INTRA EXTRACRAN SEL INTERNAL CAROTID BILAT MOD SED  04/07/2017  . IR ANGIO INTRA EXTRACRAN SEL INTERNAL CAROTID BILAT MOD SED  01/07/2018  . IR ANGIO VERTEBRAL SEL VERTEBRAL UNI R MOD SED  04/07/2017  . IR ANGIO VERTEBRAL SEL VERTEBRAL UNI R MOD SED  01/07/2018  . IR ANGIOGRAM FOLLOW UP STUDY  04/07/2017  . IR ANGIOGRAM FOLLOW UP STUDY  04/07/2017  . IR ANGIOGRAM FOLLOW UP STUDY  04/07/2017  . IR ANGIOGRAM FOLLOW UP STUDY  04/07/2017  . IR ANGIOGRAM FOLLOW UP STUDY  04/07/2017  . IR ANGIOGRAM SELECTIVE EACH ADDITIONAL VESSEL  04/07/2017  . IR TRANSCATH/EMBOLIZ  04/07/2017  .  IR US GUIDE VASC ACCESS RIGHT  01/07/2018  . ORIF ANKLE FRACTURE Right 07/01/2018   Procedure: RIGHT ANKLE FRACTURE OPEN TREATMENT TRIMALLEOLAR INCLUDES INTERNAL FIXATION WITHOUT FIXATION OF POSTERIOR LIP, FRACTURE CLOSED TREATMENT TRIMALLEOLAR ANKLE WITH MANIPULATION, REPAIR SYNDESMOSIS, REPAIR DELTOID LIGAMENT;  Surgeon: Renette Butters, MD;  Location: [place];  Service: Orthopedics;  Laterality: Right;  . PARS PLANA VITRECTOMY Right 05/01/2017   Procedure: PARS PLANA VITRECTOMY WITH 25 GAUGE RIGHT EYE, endolaser photocoaglation;  Surgeon: Jalene Mullet, MD;  Location: [place];  Service: Ophthalmology;  Laterality: Right;  . RADIOLOGY WITH ANESTHESIA N/A 04/07/2017   Procedure: RADIOLOGY WITH ANESTHESIA;  Surgeon: Consuella Lose, MD;  Location: [place];  Service: Radiology;  Laterality: N/A;  . REPLACEMENT TOTAL KNEE BILATERAL  2004  . TEMPOROMANDIBULAR JOINT SURGERY     2 surgeries  . TUMOR REMOVAL    . VAGOTOMY  01/23/2012   Procedure: VAGOTOMY, antrectomy and BII;  Surgeon: Haywood Lasso, MD;  Location: [place];  Service: General;  Laterality: N/A;  Laparotomy with vagotomy.    There were no vitals filed for this visit.  Subjective Assessment - 11/19/18 2134    Subjective   This is hard but I like it    Patient is accompained by:  Family member   mom   Pertinent History  Subarachnoid hemorrhage due to  ruptured aneurysm, ataxia (L cerebellar); HTN, R side decr sensation, vertical diplopia/visual deficits, ataxia, Bilateral retinal hemorhage associated with SAH (with surgery on R eye in hospital and L approx 1 month ago)    Limitations  visual deficits, fall risk, impulsivity/cognitive deficits    Patient Stated Goals  I want to be able to walk without assistance so I can do all the things I need to do as a wife and mom, use my hand better.     Currently in Pain?  No/denies         Patient seen for aquatic therapy today.  Treatment took place in water 2.5-4 feet  deep depending upon activity.  Pt entered the pool via steps using 2 hand rails. Pt needs cues for safe descent into pool and contact guard.  Treatment focused on functional ambulation using large water PVC walker with strong emphasis on slowly rate of movement down, forward weight shift (vs lateral weight shift) and focusing on normalizing step length.  Pt requires significant cueing and contact guard in order to maintain COG over her BOS.  When pt is able to focus, performance improves.  Also addressed at length transitional movements focused on turning as pt often loses her balance on land with turns.  Initially practiced standing with wide BOS and with UE support using water walker and working on maintaining balance while engaging in upper trunk initiated rotation left to right and back again. Pt benefits from significant repetition with each intervention.  Progressed to short distance ambulation with full stop (to utilize principal of inertia with drag in water) followed by block turning - pt cued to turn upper body first and then move feet as pt does not have the control to turn walker while stepping on land.  After several repetitions progressed from contact guard and max cues to supervision and min cues turning R, intermittent contact guard and min cues turning left (when pt exited pool then incorporated block turning on land with walker and pt able translate activities from water to land).  Addressed ataxia via modified quadruped activity on stairs with min a and multiple repetitions.  Also addressed postural alignment and control with transitional movements along pool wall creating closed chain loop with UE's with emphasis on weight shifting and alignment as well as decreasing speed to allow for greater control.  Pt exited the pool via steps using 2 hand rails and contact guard.                      OT Short Term Goals - 11/19/18 2135      OT SHORT TERM GOAL #1   Title  Pt will  verbalize understanding of basic water principles in prep for aquatic HEP - 12/10/2018    Status  New      OT SHORT TERM GOAL #7   Status  --   inconsistent.  01/09/18 met at approx this level       OT Long Term Goals - 11/19/18 2135      OT LONG TERM GOAL #1   Title  Pt and family willl be mod I with aquatic HEP (for use when pt has access to pool) - 12/11/27/2020 (date adjusted as pt is just now returning after evaluation for first treatment session)    Status  On-going      OT LONG TERM GOAL #2   Title  Pt will demonstrate improved postural alignment and control in standing as demonstrated by ability  to do light home mgmt tasks in standing (such as fold laundry)    Status  On-going      OT LONG TERM GOAL #3   Title  Pt will report greater ease in using LUE for simple home mgmt tasks (such as unloading dishwasher into [employer]) .     Status  On-going      OT LONG TERM GOAL #4   Title  Pt will be mod I using RW during simple home mgmt tasks at ambulatory level.      Status  On-going            Plan - 11/19/18 2135    Clinical Impression Statement  Pt progressing toward goals.  Pt requires moderate redireciton for attention to tasks however when focused demonstrates improved control for mobility    Occupational Profile and client history currently impacting functional performance  Pt is a 42 y.o. female s/p subarachnoid hemorrhage due to ruptured aneurysm, L cerebellar infarct with ataxia.  Pt was working full time and indpendent prior to hospitalization.  Pt lives with husband and cared for 57 y.o. daughter.  Medical history includes HTN, GERD, and Bilateral retinal hemorhage associated with SAH (needed surgery).      Occupational performance deficits (Please refer to evaluation for details):  ADL's;IADL's;Leisure;Social Participation;Work    Neurosurgeon    Current Impairments/barriers affecting progress:  cognitive deficits, impulsivity    OT Frequency  1x /  week    OT Duration  8 weeks    OT Treatment/Interventions  Self-care/ADL training;Therapeutic exercise;Therapeutic activities;Cognitive remediation/compensation;Passive range of motion;Functional Mobility Training;Neuromuscular education;Energy conservation;Manual Therapy;Patient/family education;Balance training;Aquatic Therapy    Plan  aquatic therapy treatment relative to balance, postural alignment and control, functional ambulation, UE and LE strengthening    Consulted and Agree with Plan of Care  Patient;Family member/caregiver    Family Member Consulted  mother       Patient will benefit from skilled therapeutic intervention in order to improve the following deficits and impairments:  Decreased coordination, Decreased range of motion, Difficulty walking, Abnormal gait, Decreased safety awareness, Impaired sensation, Decreased knowledge of precautions, Decreased balance, Decreased knowledge of use of DME, Impaired UE functional use, Decreased cognition, Decreased mobility, Decreased strength, Impaired vision/preception, Impaired perceived functional ability  Visit Diagnosis: Unsteadiness on feet  Other lack of coordination  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 Osteoarthritis of right hip 10/07/2018  . 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 Weakness with dizziness-  since [facility] and CVA 04/07/17 08/07/2017  . Disturbances of vision, late effect of stroke 08/06/2017  . Health education/counseling 08/05/2017  . High risk medications (not anticoagulants) long-term use 08/05/2017  . Neuritis-  R sided:  arm and leg/ body due to stroke 08/05/2017  . Elevated LDL cholesterol level 07/11/2017  . [employer] (NIH) Stroke Scale limb ataxia score 2, ataxia present in  two limbs 06/25/2017  . 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 Elevated vitamin B12 level 05/30/2017  . Vitamin D deficiency 05/29/2017  . History of tobacco abuse-  30pk yr hx - quit 04/07/17 05/21/2017  . Gait disturbance, post-stroke 05/14/2017  . Benign essential HTN   . Vitreous hemorrhage of right eye ([place])   . Adjustment disorder with mixed anxiety and depressed mood   .  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 s/p SAH (subarachnoid hemorrhage) ([place]) 04/19/2017  . 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 Hypoxia   . Subarachnoid hemorrhage due to ruptured aneurysm ([place]) 04/07/2017  . CVA (cerebral vascular accident) ([place]) 04/07/2017  . Elevated gastrin level 01/25/2012  . Hypokalemia 01/21/2012  . Nausea & vomiting 01/20/2012  . Epigastric pain 01/20/2012  . Duodenal ulcer, acute with obstruction 10/17/2011  . S/P laparoscopic cholecystectomy 10/16/2011    Quay Burow, OTR/L 11/19/2018, 9:38 PM  [facility] [address] [place], [place], [address] Phone: [phone]   Fax:  [phone]  Name: [name] Barnett MRN: [id number] Date of Birth: 09/08/1976

## 2018-11-21 ENCOUNTER — Other Ambulatory Visit: Payer: Self-pay | Admitting: Family Medicine

## 2018-11-21 ENCOUNTER — Ambulatory Visit: Payer: BLUE CROSS/BLUE SHIELD | Admitting: Physical Therapy

## 2018-11-21 ENCOUNTER — Encounter: Payer: Self-pay | Admitting: Physical Therapy

## 2018-11-21 DIAGNOSIS — F4323 Adjustment disorder with mixed anxiety and depressed mood: Secondary | ICD-10-CM

## 2018-11-21 DIAGNOSIS — R2689 Other abnormalities of gait and mobility: Secondary | ICD-10-CM

## 2018-11-21 DIAGNOSIS — R278 Other lack of coordination: Secondary | ICD-10-CM

## 2018-11-21 DIAGNOSIS — R2681 Unsteadiness on feet: Secondary | ICD-10-CM

## 2018-11-21 NOTE — Therapy (Signed)
[facility] [address] [place], [place], [address] Phone: [phone]   Fax:  [phone]  Physical Therapy Treatment  Patient Details  Name: [name] Barnett MRN: [id number] Date of Birth: Apr 19, 1976 Referring Provider ([name] Barnett): Edmonia Lynch, MD   Encounter Date: 11/21/2018  [name] Barnett End of Session - 11/21/18 1615    Visit Number  17    Number of Visits  31    Date for [name] Barnett Re-Evaluation  01/09/19    Authorization Type  [employer]    [name] Barnett Start Time  8250    [name] Barnett Stop Time  1532    [name] Barnett Time Calculation (min)  41 min    Activity Tolerance  Patient tolerated treatment well    Behavior During Therapy  [facility] for tasks assessed/performed       Past Medical History:  Diagnosis Date  . Duodenal obstruction   . GERD (gastroesophageal reflux disease)   . Hypertension   . Stroke [facility])    april 2018, lt sided weakness  . Trimalleolar fracture of ankle, closed, right, initial encounter     Past Surgical History:  Procedure Laterality Date  . BALLOON DILATION  12/31/2011   Procedure: BALLOON DILATION;  Surgeon: Missy Sabins, MD;  Location: [facility] ENDOSCOPY;  Service: Endoscopy;  Laterality: N/A;  . CHOLECYSTECTOMY  07/28/11  . ESOPHAGOGASTRODUODENOSCOPY  10/17/2011   Procedure: ESOPHAGOGASTRODUODENOSCOPY (EGD);  Surgeon: Missy Sabins, MD;  Location: [facility] ENDOSCOPY;  Service: Endoscopy;  Laterality: N/A;  . IR ANGIO INTRA EXTRACRAN SEL INTERNAL CAROTID BILAT MOD SED  04/07/2017  . IR ANGIO INTRA EXTRACRAN SEL INTERNAL CAROTID BILAT MOD SED  01/07/2018  . IR ANGIO VERTEBRAL SEL VERTEBRAL UNI R MOD SED  04/07/2017  . IR ANGIO VERTEBRAL SEL VERTEBRAL UNI R MOD SED  01/07/2018  . IR ANGIOGRAM FOLLOW UP STUDY  04/07/2017  . IR ANGIOGRAM FOLLOW UP STUDY  04/07/2017  . IR ANGIOGRAM FOLLOW UP STUDY  04/07/2017  . IR ANGIOGRAM FOLLOW UP STUDY  04/07/2017  . IR ANGIOGRAM FOLLOW UP STUDY  04/07/2017  . IR ANGIOGRAM SELECTIVE EACH ADDITIONAL VESSEL  04/07/2017  . IR  TRANSCATH/EMBOLIZ  04/07/2017  . IR US GUIDE VASC ACCESS RIGHT  01/07/2018  . ORIF ANKLE FRACTURE Right 07/01/2018   Procedure: RIGHT ANKLE FRACTURE OPEN TREATMENT TRIMALLEOLAR INCLUDES INTERNAL FIXATION WITHOUT FIXATION OF POSTERIOR LIP, FRACTURE CLOSED TREATMENT TRIMALLEOLAR ANKLE WITH MANIPULATION, REPAIR SYNDESMOSIS, REPAIR DELTOID LIGAMENT;  Surgeon: Renette Butters, MD;  Location: [place];  Service: Orthopedics;  Laterality: Right;  . PARS PLANA VITRECTOMY Right 05/01/2017   Procedure: PARS PLANA VITRECTOMY WITH 25 GAUGE RIGHT EYE, endolaser photocoaglation;  Surgeon: Jalene Mullet, MD;  Location: [place];  Service: Ophthalmology;  Laterality: Right;  . RADIOLOGY WITH ANESTHESIA N/A 04/07/2017   Procedure: RADIOLOGY WITH ANESTHESIA;  Surgeon: Consuella Lose, MD;  Location: [place];  Service: Radiology;  Laterality: N/A;  . REPLACEMENT TOTAL KNEE BILATERAL  2004  . TEMPOROMANDIBULAR JOINT SURGERY     2 surgeries  . TUMOR REMOVAL    . VAGOTOMY  01/23/2012   Procedure: VAGOTOMY, antrectomy and BII;  Surgeon: Haywood Lasso, MD;  Location: [place];  Service: General;  Laterality: N/A;  Laparotomy with vagotomy.    There were no vitals filed for this visit.  Subjective Assessment - 11/21/18 1614    Subjective  doing well - has pain at thigh and groin    Patient is accompained by:  Family member   mother  Pertinent History  Ataxia post stroke (April 2018), Duodenal obstruction, HTN, GERD    Patient Stated Goals  [name] Barnett reports goal of walking with no AD and improve balance to reduce fall risk    Currently in Pain?  Yes    Pain Score  4     Pain Location  Leg   groin/adductors   Pain Orientation  Right    Pain Descriptors / Indicators  Aching;Tightness    Pain Type  Chronic pain                       [facility] Adult [name] Barnett Treatment/Exercise - 11/21/18 0001      Ambulation/Gait   Ambulation/Gait  Yes    Ambulation/Gait Assistance  4: Min guard     Ambulation/Gait Assistance Details  min guard to provide cueing at pelvis and proximal hip to reduce rotation and emphasize more forward advacement of LE    Ambulation Distance (Feet)  115 Feet    Assistive device  Rolling walker    Gait Pattern  Step-through pattern;Decreased stance time - left;Decreased stride length;Ataxic;Lateral hip instability    Ambulation Surface  Level;Indoor    Gait Comments  standing at table with R LE planted - L LE step through (fwd/bwd) with cueing at pelvis to reduce rotation      Manual Therapy   Manual Therapy  Soft tissue mobilization;Myofascial release    Manual therapy comments  patient supine with [name] Barnett propping knee for comfort    Soft tissue mobilization  STM to R thigh (anterior, medial and lateral), R hip flexor group    Myofascial Release  manual trigger point release at R quad and adductors with pain reporduction with deep palpation       Trigger Point Dry Needling - 11/21/18 1621    Consent Given?  Yes    Muscles Treated Lower Body  Adductor longus/brevius/maximus;Quadriceps   iliopsoas   Quadriceps Response  Twitch response elicited;Palpable increased muscle length    Adductor Response  Twitch response elicited;Palpable increased muscle length             [name] Barnett Short Term Goals - 11/16/18 1722      [name] Barnett SHORT TERM GOAL #1   Title  [name] Barnett will report ongoing compliance with HEP and fall prevention recommendations    Time  4    Period  Weeks    Status  New    Target Date  12/10/18      [name] Barnett SHORT TERM GOAL #2   Title  [name] Barnett will improve gait speed to >/= 2.8 ft/esc indicative of community ambulation with LRAD     Baseline  2.73 ft/sec with RW    Time  4    Period  Weeks    Status  Revised    Target Date  12/10/18      [name] Barnett SHORT TERM GOAL #3   Title  [name] Barnett will improve Berg score to >/= 42/56 indicating decreased fall risk potential     Baseline  39/56    Time  4    Period  Weeks    Status  Revised    Target Date  12/10/18      [name] Barnett SHORT  TERM GOAL #4   Title  [name] Barnett will increased TUG score to >/= 15 seconds indicating decreased risk for falls and improvement in functional mobility using LRAD    Baseline  20.6 with RW    Time  4    Period  Weeks    Status  Revised    Target Date  12/10/18      [name] Barnett SHORT TERM GOAL #5   Title  [name] Barnett will report </= 5/10 pain in R hip during exercises, WB and gait    Time  4    Period  Weeks    Status  New    Target Date  12/10/18      [name] Barnett SHORT TERM GOAL #6   Title  Patient will perform 12 steps with alternating stepping pattern using B HR's to improve community accessibility with close supervision    Time  4    Period  Weeks    Status  Revised    Target Date  12/10/18        [name] Barnett Long Term Goals - 11/17/18 2142      [name] Barnett LONG TERM GOAL #1   Title  [name] Barnett will demonstrate independence with land and aquatic HEP      [name] Barnett LONG TERM GOAL #2   Title  [name] Barnett will improve Berg score to >/= 46/56 to decrease fall risk potential     Baseline  39/56 11/10/2018      [name] Barnett LONG TERM GOAL #3   Title  [name] Barnett will improve gait speed to >/=3.0 ft/sec indicating significant change in functional mobility with LRAD     Baseline  [name] Barnett 2.73 ft/sec with RW 11/10/2018, improved just not to goal level      [name] Barnett LONG TERM GOAL #4   Title  [name] Barnett will improve TUG score to </= 13.5 seconds using LRAD indicating decreased fall risk potential     Baseline  [name] Barnett performed with RW in 20.6 sec, improved just not to goal      [name] Barnett LONG TERM GOAL #5   Title  [name] Barnett will negotiate 12 steps using B HR's and alternating stepping technique with supervision to improve community accessibility     Baseline  met 11-17-18    Status  Achieved      [name] Barnett LONG TERM GOAL #6   Title  [name] Barnett will report ability to perform household ambulation using her RW with modified independence demonstrating significant improvement in functional mobility     Baseline  Reports intermittent use of rolling chair; and use of RW dpending on the day.      [name] Barnett LONG TERM GOAL #7   Title  [name] Barnett  will ambulate outdoors over paved surfaces and perform curbs and inclines with supervision using LRAD for 300 feet to improve functional mobility with community distances       [name] Barnett LONG TERM GOAL #8   Title  [name] Barnett will ambulate over level surfaces with her RW for 230 feet with supervision using LRAD demonstrating improvement in functional mobility             Plan - 11/21/18 1616    Clinical Impression Statement  Patient seen by this [name] Barnett for manual and TDN treatment. TDN to R adductor, rectus femoris with good response and noted twtich response. [name] Barnett, [name] Barnett, performing TDN to R ilipsoaos, with good response. Patient continues to report pain relief upon standing and gait. Gait focusing on reducing rotation with emphasis on more forward advancement of LE. Did have patient stand at table and solely work on step through pattern (fwd/bwd) with cueing at pevlis and proximal hips. Will continue to progress.     Rehab Potential  Good    Clinical Impairments Affecting Rehab Potential  high risk for falls due to abnormal gait,  ataxia, sensory changes, and impaired cognition.     [name] Barnett Frequency  2x / week    [name] Barnett Duration  8 weeks    [name] Barnett Treatment/Interventions  ADLs/Self Care Home Management;Aquatic Therapy;DME Instruction;Gait training;Stair training;Functional mobility training;Therapeutic activities;Therapeutic exercise;Balance training;Neuromuscular re-education;Patient/family education;Cognitive remediation;Orthotic Fit/Training;Electrical Stimulation;Cryotherapy;Moist Heat;Manual techniques;Passive range of motion;Taping;Energy conservation;Dry needling    [name] Barnett Next Visit Plan  Dry needling prn for Rt quad pain; if determined not needed, cont to work on balance, gait and trunk stabilization activities     [name] Barnett Home Exercise Plan  Access Code: [email or web address]     Consulted and Agree with Plan of Care  Patient;Family member/caregiver    Family Member Consulted  Spouse       Patient will benefit from skilled  therapeutic intervention in order to improve the following deficits and impairments:  Abnormal gait, Decreased balance, Decreased cognition, Decreased coordination, Decreased strength, Difficulty walking, Impaired sensation, Pain, Decreased activity tolerance, Decreased mobility, Decreased endurance, Postural dysfunction, Decreased knowledge of use of DME, Impaired perceived functional ability, Decreased range of motion, Impaired flexibility  Visit Diagnosis: Unsteadiness on feet  Other lack of coordination  Other abnormalities of gait and mobility     Problem List Patient Active Problem List   Diagnosis Date Noted  . Osteoarthritis of right hip 10/07/2018  . 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 Weakness with dizziness-  since [facility] and CVA 04/07/17 08/07/2017  . Disturbances of vision, late effect of stroke 08/06/2017  . Health education/counseling 08/05/2017  . High risk medications (not anticoagulants) long-term use 08/05/2017  . Neuritis-  R sided:  arm and leg/ body due to stroke 08/05/2017  . Elevated LDL cholesterol level 07/11/2017  . [employer] (NIH) Stroke Scale limb ataxia score 2, ataxia present in two limbs 06/25/2017  . 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 Elevated vitamin B12 level 05/30/2017  . Vitamin D deficiency 05/29/2017  . History of tobacco abuse-  30pk yr hx - quit 04/07/17 05/21/2017  . Gait disturbance, post-stroke 05/14/2017  . Benign essential HTN   . Vitreous hemorrhage of right eye ([place])   . Adjustment disorder with mixed anxiety and depressed mood   . 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 s/p SAH (subarachnoid hemorrhage) ([place]) 04/19/2017  . 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 Hypoxia   . Subarachnoid hemorrhage due to ruptured aneurysm ([place]) 04/07/2017  . CVA (cerebral vascular accident) ([place]) 04/07/2017  . Elevated gastrin level 01/25/2012   . Hypokalemia 01/21/2012  . Nausea & vomiting 01/20/2012  . Epigastric pain 01/20/2012  . Duodenal ulcer, acute with obstruction 10/17/2011  . S/P laparoscopic cholecystectomy 10/16/2011     [name] Barnett, [name] Barnett, [name] Barnett Supplemental Physical Therapist 11/21/18 4:24 PM Pager: [phone] Office: [facility] [address] [place], [place], [address] Phone: [phone]   Fax:  [phone]  Name: [name] Barnett MRN: [id number] Date of Birth: 1976/09/16

## 2018-11-24 ENCOUNTER — Encounter: Payer: Self-pay | Admitting: Physical Therapy

## 2018-11-24 ENCOUNTER — Ambulatory Visit: Payer: BLUE CROSS/BLUE SHIELD | Admitting: Physical Therapy

## 2018-11-24 DIAGNOSIS — R2681 Unsteadiness on feet: Secondary | ICD-10-CM | POA: Diagnosis not present

## 2018-11-24 DIAGNOSIS — R2689 Other abnormalities of gait and mobility: Secondary | ICD-10-CM

## 2018-11-24 DIAGNOSIS — R278 Other lack of coordination: Secondary | ICD-10-CM

## 2018-11-24 DIAGNOSIS — R260 Ataxic gait: Secondary | ICD-10-CM

## 2018-11-24 DIAGNOSIS — M79651 Pain in right thigh: Secondary | ICD-10-CM

## 2018-11-24 NOTE — Therapy (Signed)
[facility] [address] [place], [place], [address] Phone: [phone]   Fax:  [phone]  Physical Therapy Treatment  Patient Details  Name: [name] Barnett MRN: [id number] Date of Birth: [birth year] Referring Provider (PT): Edmonia Lynch, MD   Encounter Date: 11/24/2018  PT End of Session - 11/24/18 1656    Visit Number  18    Number of Visits  31    Date for PT Re-Evaluation  01/09/19    Authorization Type  BCBS    PT Start Time  6803    PT Stop Time  1620    PT Time Calculation (min)  45 min    Activity Tolerance  Patient tolerated treatment well    Behavior During Therapy  [facility] for tasks assessed/performed       Past Medical History:  Diagnosis Date  . Duodenal obstruction   . GERD (gastroesophageal reflux disease)   . Hypertension   . Stroke [facility])    april 2018, lt sided weakness  . Trimalleolar fracture of ankle, closed, right, initial encounter     Past Surgical History:  Procedure Laterality Date  . BALLOON DILATION  12/31/2011   Procedure: BALLOON DILATION;  Surgeon: Missy Sabins, MD;  Location: [facility] ENDOSCOPY;  Service: Endoscopy;  Laterality: N/A;  . CHOLECYSTECTOMY  07/28/11  . ESOPHAGOGASTRODUODENOSCOPY  10/17/2011   Procedure: ESOPHAGOGASTRODUODENOSCOPY (EGD);  Surgeon: Missy Sabins, MD;  Location: [facility] ENDOSCOPY;  Service: Endoscopy;  Laterality: N/A;  . IR ANGIO INTRA EXTRACRAN SEL INTERNAL CAROTID BILAT MOD SED  04/07/2017  . IR ANGIO INTRA EXTRACRAN SEL INTERNAL CAROTID BILAT MOD SED  01/07/2018  . IR ANGIO VERTEBRAL SEL VERTEBRAL UNI R MOD SED  04/07/2017  . IR ANGIO VERTEBRAL SEL VERTEBRAL UNI R MOD SED  01/07/2018  . IR ANGIOGRAM FOLLOW UP STUDY  04/07/2017  . IR ANGIOGRAM FOLLOW UP STUDY  04/07/2017  . IR ANGIOGRAM FOLLOW UP STUDY  04/07/2017  . IR ANGIOGRAM FOLLOW UP STUDY  04/07/2017  . IR ANGIOGRAM FOLLOW UP STUDY  04/07/2017  . IR ANGIOGRAM SELECTIVE EACH ADDITIONAL VESSEL  04/07/2017  . IR  TRANSCATH/EMBOLIZ  04/07/2017  . IR US GUIDE VASC ACCESS RIGHT  01/07/2018  . ORIF ANKLE FRACTURE Right 07/01/2018   Procedure: RIGHT ANKLE FRACTURE OPEN TREATMENT TRIMALLEOLAR INCLUDES INTERNAL FIXATION WITHOUT FIXATION OF POSTERIOR LIP, FRACTURE CLOSED TREATMENT TRIMALLEOLAR ANKLE WITH MANIPULATION, REPAIR SYNDESMOSIS, REPAIR DELTOID LIGAMENT;  Surgeon: Renette Butters, MD;  Location: [facility];  Service: Orthopedics;  Laterality: Right;  . PARS PLANA VITRECTOMY Right 05/01/2017   Procedure: PARS PLANA VITRECTOMY WITH 25 GAUGE RIGHT EYE, endolaser photocoaglation;  Surgeon: Jalene Mullet, MD;  Location: [facility];  Service: Ophthalmology;  Laterality: Right;  . RADIOLOGY WITH ANESTHESIA N/A 04/07/2017   Procedure: RADIOLOGY WITH ANESTHESIA;  Surgeon: Consuella Lose, MD;  Location: [facility];  Service: Radiology;  Laterality: N/A;  . REPLACEMENT TOTAL KNEE BILATERAL  2004  . TEMPOROMANDIBULAR JOINT SURGERY     2 surgeries  . TUMOR REMOVAL    . VAGOTOMY  01/23/2012   Procedure: VAGOTOMY, antrectomy and BII;  Surgeon: Haywood Lasso, MD;  Location: [facility];  Service: General;  Laterality: N/A;  Laparotomy with vagotomy.    There were no vitals filed for this visit.  Subjective Assessment - 11/24/18 1640    Subjective  Needs to cancel Thursday; has an appointment with PCP.  Goes to aquatic on Thursday and would like to keep working on turning  without UE support.  Still having pain in R groin.    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  Yes                       [facility] Adult PT Treatment/Exercise - 11/24/18 1643      Ambulation/Gait   Ambulation/Gait  Yes    Ambulation/Gait Assistance  4: Min assist    Ambulation/Gait Assistance Details   patient's UE supported on therapist's forearms focusing on core activation, weight shifting and increased control and motor planning of gait sequence from heel off at terminal stance > heel strike at initial stance.  Focused on increased use of hip flexion instead of hip external rotation to advance COG    Ambulation Distance (Feet)  115 Feet    Assistive device  1 person hand held assist    Gait Pattern  Step-through pattern;Decreased step length - right;Decreased step length - left;Decreased stance time - right;Decreased weight shift to right;Ataxic;Lateral hip instability    Ambulation Surface  Level;Indoor      Neuro Re-ed    Neuro Re-ed Details   Pt in supine performed diaphragmatic breathing with core activation and maintaining core activation during alternating LE hip flexion; transitioned to standing with RUE support performing alternating hip flexion with core contraction to decrease use of lumbar extension x 5 reps each side      Manual Therapy   Manual Therapy  Joint mobilization    Manual therapy comments  patient prone    Joint Mobilization  PA mobilizations grade 2 to lumbar facet joints on R and L side with pt reporting referred pain to anterior hip       Trigger Point Dry Needling - 11/24/18 1641    Consent Given?  Yes    Muscles Treated Lower Body  Piriformis   deep external rotators, lumbar multifidus   Piriformis Response  Twitch response elicited           PT Education - 11/24/18 1655    Education provided  Yes    Education Details  dry needling external rotator group and lumbar multifidus mm    Person(s) Educated  Patient;Spouse    Methods  Explanation    Comprehension  Verbalized understanding       PT Short Term Goals - 11/16/18 1722      PT SHORT TERM GOAL #1   Title  Pt will report ongoing compliance with HEP and fall prevention recommendations    Time  4    Period  Weeks    Status  New    Target Date  12/10/18      PT SHORT TERM GOAL #2   Title   Pt will improve gait speed to >/= 2.8 ft/esc indicative of community ambulation with LRAD     Baseline  2.73 ft/sec with RW    Time  4    Period  Weeks    Status  Revised    Target Date  12/10/18      PT SHORT TERM GOAL #3   Title  Pt will improve Berg score to >/= 42/56 indicating decreased fall risk potential     Baseline  39/56    Time  4    Period  Weeks    Status  Revised    Target Date  12/10/18      PT SHORT TERM GOAL #4   Title  Pt will increased TUG score to >/= 15 seconds indicating decreased risk for falls and improvement in functional mobility using LRAD    Baseline  20.6 with RW    Time  4    Period  Weeks    Status  Revised    Target Date  12/10/18      PT SHORT TERM GOAL #5   Title  Pt will report </= 5/10 pain in R hip during exercises, WB and gait    Time  4    Period  Weeks    Status  New    Target Date  12/10/18      PT SHORT TERM GOAL #6   Title  Patient will perform 12 steps with alternating stepping pattern using B HR's to improve community accessibility with close supervision    Time  4    Period  Weeks    Status  Revised    Target Date  12/10/18        PT Long Term Goals - 11/17/18 2142      PT LONG TERM GOAL #1   Title  Pt will demonstrate independence with land and aquatic HEP      PT LONG TERM GOAL #2   Title  Pt will improve Berg score to >/= 46/56 to decrease fall risk potential     Baseline  39/56 11/10/2018      PT LONG TERM GOAL #3   Title  Pt will improve gait speed to >/=3.0 ft/sec indicating significant change in functional mobility with LRAD     Baseline  Pt 2.73 ft/sec with RW 11/10/2018, improved just not to goal level      PT LONG TERM GOAL #4   Title  Pt will improve TUG score to </= 13.5 seconds using LRAD indicating decreased fall risk potential     Baseline  Pt performed with RW in 20.6 sec, improved just not to goal      PT LONG TERM GOAL #5   Title  Pt will negotiate 12 steps using B HR's and alternating stepping  technique with supervision to improve community accessibility     Baseline  met 11-17-18    Status  Achieved      PT LONG TERM GOAL #6   Title  Pt will report ability to perform household ambulation using her RW with modified independence demonstrating significant improvement in functional mobility     Baseline  Reports intermittent use of rolling chair; and use of RW dpending on the day.      PT LONG TERM GOAL #7   Title  Pt will ambulate outdoors over paved surfaces and perform curbs and inclines with supervision using LRAD for 300 feet to improve functional mobility with community distances       PT LONG TERM GOAL #8   Title  Pt will ambulate over level surfaces with her RW for 230 feet with supervision using LRAD demonstrating improvement in functional mobility             Plan - 11/24/18 1657    Clinical Impression Statement  Pt seen for manual therapy, NMR and TDN treatment of posterior hip external rotator mm as well as lumbar multifidus mm.  Twitch response noted in hip external rotators and tissue change noted  in lumbar muscles.  Pt continued to report referred pain to anterior hip with manual therapy to lumbar facet joints.  Continued NMR in supine, standing and during gait to increase core and proximal stability and decrease use of lumbar extensors during gait.  Will continue to progress.    Rehab Potential  Good    Clinical Impairments Affecting Rehab Potential  high risk for falls due to abnormal gait, ataxia, sensory changes, and impaired cognition.     PT Frequency  2x / week    PT Duration  8 weeks    PT Treatment/Interventions  ADLs/Self Care Home Management;Aquatic Therapy;DME Instruction;Gait training;Stair training;Functional mobility training;Therapeutic activities;Therapeutic exercise;Balance training;Neuromuscular re-education;Patient/family education;Cognitive remediation;Orthotic Fit/Training;Electrical Stimulation;Cryotherapy;Moist Heat;Manual techniques;Passive  range of motion;Taping;Energy conservation;Dry needling    PT Next Visit Plan  TDN to lumbar multifidus; proximal stability in supine, tall kneeling and standing, focus on decreased use of lumbar extensors during gait, cont to work on balance, gait and trunk stabilization activities     PT Home Exercise Plan  Access Code: [email or web address]     Consulted and Agree with Plan of Care  Patient;Family member/caregiver    Family Member Consulted  Spouse       Patient will benefit from skilled therapeutic intervention in order to improve the following deficits and impairments:  Abnormal gait, Decreased balance, Decreased cognition, Decreased coordination, Decreased strength, Difficulty walking, Impaired sensation, Pain, Decreased activity tolerance, Decreased mobility, Decreased endurance, Postural dysfunction, Decreased knowledge of use of DME, Impaired perceived functional ability, Decreased range of motion, Impaired flexibility  Visit Diagnosis: Ataxic gait  Other lack of coordination  Other abnormalities of gait and mobility  Unsteadiness on feet  Pain in right thigh     Problem List Patient Active Problem List   Diagnosis Date Noted  . Osteoarthritis of right hip 10/07/2018  . 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 Weakness with dizziness-  since [facility] and CVA 04/07/17 08/07/2017  . Disturbances of vision, late effect of stroke 08/06/2017  . Health education/counseling 08/05/2017  . High risk medications (not anticoagulants) long-term use 08/05/2017  . Neuritis-  R sided:  arm and leg/ body due to stroke 08/05/2017  . Elevated LDL cholesterol level 07/11/2017  . [employer] (NIH) Stroke Scale limb ataxia score 2, ataxia present in two limbs 06/25/2017  . 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 Elevated vitamin B12 level 05/30/2017  . Vitamin D deficiency 05/29/2017  . History of tobacco abuse-  30pk yr hx - quit 04/07/17 05/21/2017  . Gait disturbance,  post-stroke 05/14/2017  . Benign essential HTN   . Vitreous hemorrhage of right eye ([place])   . Adjustment disorder with mixed anxiety and depressed mood   . 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 s/p SAH (subarachnoid hemorrhage) ([place]) 04/19/2017  . 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 Hypoxia   . Subarachnoid hemorrhage due to ruptured aneurysm ([place]) 04/07/2017  . CVA (cerebral vascular accident) ([place]) 04/07/2017  . Elevated gastrin level 01/25/2012  . Hypokalemia 01/21/2012  . Nausea & vomiting 01/20/2012  . Epigastric pain 01/20/2012  . Duodenal ulcer, acute with obstruction 10/17/2011  . S/P laparoscopic cholecystectomy 10/16/2011    Rico Junker, PT, DPT 11/24/18    5:03 PM    [facility] [address] [place], [place], [address] Phone: [phone]   Fax:  [phone]  Name: [name] Barnett  MRN: [id number] Date of Birth: [birth year]

## 2018-11-26 ENCOUNTER — Encounter: Payer: Self-pay | Admitting: Occupational Therapy

## 2018-11-26 ENCOUNTER — Ambulatory Visit: Payer: BLUE CROSS/BLUE SHIELD | Admitting: Occupational Therapy

## 2018-11-26 DIAGNOSIS — R208 Other disturbances of skin sensation: Secondary | ICD-10-CM

## 2018-11-26 DIAGNOSIS — R2681 Unsteadiness on feet: Secondary | ICD-10-CM

## 2018-11-26 DIAGNOSIS — R293 Abnormal posture: Secondary | ICD-10-CM

## 2018-11-26 DIAGNOSIS — R270 Ataxia, unspecified: Secondary | ICD-10-CM

## 2018-11-26 DIAGNOSIS — R278 Other lack of coordination: Secondary | ICD-10-CM

## 2018-11-26 DIAGNOSIS — I69054 Hemiplegia and hemiparesis following nontraumatic subarachnoid hemorrhage affecting left non-dominant side: Secondary | ICD-10-CM

## 2018-11-26 NOTE — Therapy (Signed)
[facility] [address] [place], [place], [address] Phone: [phone]   Fax:  [phone]  Occupational Therapy Treatment  Patient Details  Name: [name] Barnett MRN: [id number] Date of Birth: [birth year] Referring Provider (OT): Dr. Alysia Penna   Encounter Date: 11/26/2018  OT End of Session - 11/26/18 1735    Visit Number  80    Number of Visits  8    Date for OT Re-Evaluation  01/07/19    Authorization Type  BCBS, no visit limit/auth req.    OT Start Time  1455    OT Stop Time  1540    OT Time Calculation (min)  45 min    Activity Tolerance  Patient tolerated treatment well       Past Medical History:  Diagnosis Date  . Duodenal obstruction   . GERD (gastroesophageal reflux disease)   . Hypertension   . Stroke [facility])    april 2018, lt sided weakness  . Trimalleolar fracture of ankle, closed, right, initial encounter     Past Surgical History:  Procedure Laterality Date  . BALLOON DILATION  12/31/2011   Procedure: BALLOON DILATION;  Surgeon: Missy Sabins, MD;  Location: [facility] ENDOSCOPY;  Service: Endoscopy;  Laterality: N/A;  . CHOLECYSTECTOMY  07/28/11  . ESOPHAGOGASTRODUODENOSCOPY  10/17/2011   Procedure: ESOPHAGOGASTRODUODENOSCOPY (EGD);  Surgeon: Missy Sabins, MD;  Location: [facility] ENDOSCOPY;  Service: Endoscopy;  Laterality: N/A;  . IR ANGIO INTRA EXTRACRAN SEL INTERNAL CAROTID BILAT MOD SED  04/07/2017  . IR ANGIO INTRA EXTRACRAN SEL INTERNAL CAROTID BILAT MOD SED  01/07/2018  . IR ANGIO VERTEBRAL SEL VERTEBRAL UNI R MOD SED  04/07/2017  . IR ANGIO VERTEBRAL SEL VERTEBRAL UNI R MOD SED  01/07/2018  . IR ANGIOGRAM FOLLOW UP STUDY  04/07/2017  . IR ANGIOGRAM FOLLOW UP STUDY  04/07/2017  . IR ANGIOGRAM FOLLOW UP STUDY  04/07/2017  . IR ANGIOGRAM FOLLOW UP STUDY  04/07/2017  . IR ANGIOGRAM FOLLOW UP STUDY  04/07/2017  . IR ANGIOGRAM SELECTIVE EACH ADDITIONAL VESSEL  04/07/2017  . IR TRANSCATH/EMBOLIZ  04/07/2017  .  IR US GUIDE VASC ACCESS RIGHT  01/07/2018  . ORIF ANKLE FRACTURE Right 07/01/2018   Procedure: RIGHT ANKLE FRACTURE OPEN TREATMENT TRIMALLEOLAR INCLUDES INTERNAL FIXATION WITHOUT FIXATION OF POSTERIOR LIP, FRACTURE CLOSED TREATMENT TRIMALLEOLAR ANKLE WITH MANIPULATION, REPAIR SYNDESMOSIS, REPAIR DELTOID LIGAMENT;  Surgeon: Renette Butters, MD;  Location: [facility];  Service: Orthopedics;  Laterality: Right;  . PARS PLANA VITRECTOMY Right 05/01/2017   Procedure: PARS PLANA VITRECTOMY WITH 25 GAUGE RIGHT EYE, endolaser photocoaglation;  Surgeon: Jalene Mullet, MD;  Location: [facility];  Service: Ophthalmology;  Laterality: Right;  . RADIOLOGY WITH ANESTHESIA N/A 04/07/2017   Procedure: RADIOLOGY WITH ANESTHESIA;  Surgeon: Consuella Lose, MD;  Location: [facility];  Service: Radiology;  Laterality: N/A;  . REPLACEMENT TOTAL KNEE BILATERAL  2004  . TEMPOROMANDIBULAR JOINT SURGERY     2 surgeries  . TUMOR REMOVAL    . VAGOTOMY  01/23/2012   Procedure: VAGOTOMY, antrectomy and BII;  Surgeon: Haywood Lasso, MD;  Location: [facility];  Service: General;  Laterality: N/A;  Laparotomy with vagotomy.    There were no vitals filed for this visit.  Subjective Assessment - 11/26/18 1731    Patient is accompained by:  Family member   mom   Pertinent History  Subarachnoid hemorrhage due to ruptured aneurysm, ataxia (L cerebellar); HTN, R side decr sensation, vertical diplopia/visual deficits,  ataxia, Bilateral retinal hemorhage associated with SAH (with surgery on R eye in hospital and L approx 1 month ago)    Limitations  visual deficits, fall risk, impulsivity/cognitive deficits    Patient Stated Goals  I want to be able to walk without assistance so I can do all the things I need to do as a wife and mom, use my hand better.     Currently in Pain?  No/denies          Patient seen for aquatic therapy today.  Treatment took place in water 2.5-4 feet deep depending upon activity.  Pt entered the  pool via steps using bilateral hand rails and progressing down one leg at a time. Pt needed cues and close supervision but no contact guard today.  Treatment focused on LE strengthening and balance as well as increased weight bearing on opposite LE's using light noodle for marching with one leg at time (15 reps each); also focused on IR of RLE using light noodle (15 reps).  Pt required mod cues and UE support on pool wall.  Also addressed co-activation of LE's and strengthening as well as postural alignment and control with squats facing pool wall with UE support.  Pt needs mod cues and min- mod facilitation for alignment and active core.  Worked in open water today with water walker to first allow pt to find midline (pt with posterior bias when aligned) - given drag in water, pt with significantly improved ability to determine where her COG is as well as an ability to fix bias without physical assistance from therapist.  Progressed to addressing functional ambulation with water walker first with steadying assist and mod cues but progressing to close supervision and min cues .  Pt needs cues for alignment and for forward weight translation with mobility (vs lateral). Pt also requires cues for normal step length. Pt able to focus on activity without cues for attention and with decreased self distraction during activities.  When focused and with repetition and practice pt's performance significantly improves.  Also addressed core strength in sitting with resistive activities to UE's using dumb bell bar and requiring pt to maintain alignment and control during activities.  Addressed controlled sit to stand and stand to sit using dumb bell bar for light support - again drag in water assists pt in knowing where her COG is with this activity. Pt exited the pool via steps doing one step at a time with bilateral hand rails, cues to decrease reliance on UE's ,cues for postural alignment and supervision.                      OT Short Term Goals - 11/19/18 2135      OT SHORT TERM GOAL #1   Title  Pt will verbalize understanding of basic water principles in prep for aquatic HEP - 12/10/2018    Status  New      OT SHORT TERM GOAL #7   Status  --   inconsistent.  01/09/18 met at approx this level       OT Long Term Goals - 11/19/18 2135      OT LONG TERM GOAL #1   Title  Pt and family willl be mod I with aquatic HEP (for use when pt has access to pool) - 12/11/27/2020 (date adjusted as pt is just now returning after evaluation for first treatment session)    Status  On-going      OT LONG  TERM GOAL #2   Title  Pt will demonstrate improved postural alignment and control in standing as demonstrated by ability to do light home mgmt tasks in standing (such as fold laundry)    Status  On-going      OT LONG TERM GOAL #3   Title  Pt will report greater ease in using LUE for simple home mgmt tasks (such as unloading dishwasher into [employer]) .     Status  On-going      OT LONG TERM GOAL #4   Title  Pt will be mod I using RW during simple home mgmt tasks at ambulatory level.      Status  On-going            Plan - 11/26/18 1733    Clinical Impression Statement  Pt with slowly improving control for basic mobility.  Pt reports that she feels she can focus better during aquatic therapy vs in the clinic.     Occupational Profile and client history currently impacting functional performance  Pt is a 42 y.o. female s/p subarachnoid hemorrhage due to ruptured aneurysm, L cerebellar infarct with ataxia.  Pt was working full time and indpendent prior to hospitalization.  Pt lives with husband and cared for 57 y.o. daughter.  Medical history includes HTN, GERD, and Bilateral retinal hemorhage associated with SAH (needed surgery).      Occupational performance deficits (Please refer to evaluation for details):  ADL's;IADL's;Leisure;Social Participation;Work    Forensic scientist    Current Impairments/barriers affecting progress:  cognitive deficits, impulsivity    OT Frequency  1x / week    OT Duration  8 weeks    OT Treatment/Interventions  Self-care/ADL training;Therapeutic exercise;Therapeutic activities;Cognitive remediation/compensation;Passive range of motion;Functional Mobility Training;Neuromuscular education;Energy conservation;Manual Therapy;Patient/family education;Balance training;Aquatic Therapy    Plan  aquatic therapy treatment relative to balance, postural alignment and control, functional ambulation, UE and LE strengthening    Consulted and Agree with Plan of Care  Patient;Family member/caregiver    Family Member Consulted  mother       Patient will benefit from skilled therapeutic intervention in order to improve the following deficits and impairments:  Decreased coordination, Decreased range of motion, Difficulty walking, Abnormal gait, Decreased safety awareness, Impaired sensation, Decreased knowledge of precautions, Decreased balance, Decreased knowledge of use of DME, Impaired UE functional use, Decreased cognition, Decreased mobility, Decreased strength, Impaired vision/preception, Impaired perceived functional ability  Visit Diagnosis: Unsteadiness on feet  Ataxia  Abnormal posture  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 Osteoarthritis of right hip 10/07/2018  . 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 Weakness with dizziness-  since [facility] and CVA 04/07/17 08/07/2017  . Disturbances of vision, late effect of stroke 08/06/2017  . Health education/counseling 08/05/2017  . High risk medications (not anticoagulants) long-term use 08/05/2017  . Neuritis-  R sided:  arm and leg/ body due to stroke 08/05/2017  . Elevated LDL  cholesterol level 07/11/2017  . [employer] (NIH) Stroke Scale limb ataxia score 2, ataxia present in two limbs 06/25/2017  . 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 Elevated vitamin B12 level 05/30/2017  . Vitamin D deficiency 05/29/2017  . History of tobacco abuse-  30pk yr hx - quit 04/07/17 05/21/2017  . Gait disturbance, post-stroke  05/14/2017  . Benign essential HTN   . Vitreous hemorrhage of right eye ([place])   . Adjustment disorder with mixed anxiety and depressed mood   . 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 s/p SAH (subarachnoid hemorrhage) ([place]) 04/19/2017  . 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 Hypoxia   . Subarachnoid hemorrhage due to ruptured aneurysm ([place]) 04/07/2017  . CVA (cerebral vascular accident) ([place]) 04/07/2017  . Elevated gastrin level 01/25/2012  . Hypokalemia 01/21/2012  . Nausea & vomiting 01/20/2012  . Epigastric pain 01/20/2012  . Duodenal ulcer, acute with obstruction 10/17/2011  . S/P laparoscopic cholecystectomy 10/16/2011    Quay Burow, OTR/L 11/26/2018, 5:55 PM  [facility] [address] [place], [place], [address] Phone: [phone]   Fax:  [phone]  Name: MCKAY BRANDT MRN: [id number] Date of Birth: 1976-01-04

## 2018-11-27 ENCOUNTER — Ambulatory Visit: Payer: BLUE CROSS/BLUE SHIELD | Admitting: Physical Therapy

## 2018-11-27 ENCOUNTER — Encounter: Payer: Self-pay | Admitting: Family Medicine

## 2018-11-27 ENCOUNTER — Ambulatory Visit (INDEPENDENT_AMBULATORY_CARE_PROVIDER_SITE_OTHER): Payer: BLUE CROSS/BLUE SHIELD | Admitting: Family Medicine

## 2018-11-27 VITALS — BP 128/82 | HR 80 | Temp 97.8°F | Ht 64.0 in | Wt 144.0 lb

## 2018-11-27 DIAGNOSIS — E7800 Pure hypercholesterolemia, unspecified: Secondary | ICD-10-CM

## 2018-11-27 DIAGNOSIS — I639 Cerebral infarction, unspecified: Secondary | ICD-10-CM

## 2018-11-27 DIAGNOSIS — Z23 Encounter for immunization: Secondary | ICD-10-CM

## 2018-11-27 DIAGNOSIS — I10 Essential (primary) hypertension: Secondary | ICD-10-CM

## 2018-11-27 DIAGNOSIS — M792 Neuralgia and neuritis, unspecified: Secondary | ICD-10-CM

## 2018-11-27 DIAGNOSIS — S82851A Displaced trimalleolar fracture of right lower leg, initial encounter for closed fracture: Secondary | ICD-10-CM

## 2018-11-27 DIAGNOSIS — I725 Aneurysm of other precerebral arteries: Secondary | ICD-10-CM

## 2018-11-27 DIAGNOSIS — Z716 Tobacco abuse counseling: Secondary | ICD-10-CM

## 2018-11-27 DIAGNOSIS — F17200 Nicotine dependence, unspecified, uncomplicated: Secondary | ICD-10-CM

## 2018-11-27 DIAGNOSIS — F4323 Adjustment disorder with mixed anxiety and depressed mood: Secondary | ICD-10-CM | POA: Diagnosis not present

## 2018-11-27 DIAGNOSIS — E559 Vitamin D deficiency, unspecified: Secondary | ICD-10-CM

## 2018-11-27 DIAGNOSIS — E785 Hyperlipidemia, unspecified: Secondary | ICD-10-CM

## 2018-11-27 NOTE — Progress Notes (Signed)
Impression and Recommendations:    1. Adjustment disorder with mixed anxiety and depressed mood   2. Benign essential HTN   3. Elevated LDL cholesterol level   4. Trimalleolar fracture of ankle, closed, right, initial encounter   5. Cerebrovascular accident (CVA), unspecified mechanism ([name])   6. Tobacco use disorder   7. Tobacco abuse counseling   8. Basilar artery aneurysm ([name])   9. Vitamin D deficiency   10. Neuritis-  R sided:  arm and leg/ body due to stroke   11. Hyperlipidemia, unspecified hyperlipidemia type      1. Cerebrovascular accident (CVA), unspecified mechanism ([name]) - Continue with physical therapy and specialty care as recommended.  - Per patient, is doing physical therapy three days per week.  Encouraged patient to continue physical therapy and increase [facility] as desired.  - Per patient, after water therapy, feels stronger, more focused, and her mother who often accompanies her also confirms patient is doing better with the water therapy.  2. Trimalleolar fracture of ankle, closed, right, initial encounter - Continue with physical therapy and specialty care as recommended. - Will continue to monitor.  3. Benign essential HTN - Blood pressure elevated on intake today. - Re-measured in office today, at 128/82. - Continue treatment plan as prescribed.  See med list below. - Patient tolerating meds well without complication.  Denies S-E.  - Lifestyle changes such as dash diet and engaging in a regular exercise program discussed with patient.  Educational handouts provided  - Ambulatory BP monitoring encouraged. Keep log and bring in next OV.  4. Elevated LDL cholesterol level - At goal last check, March 2019. - Reviewed that patient should remain active to help increase HDL.  - Continue treatment plan as prescribed.  See med list below. - Patient tolerating meds well without complication.  Denies S-E.   5. Adjustment  disorder with mixed anxiety and depressed mood - Mood is stable at this time. - Continue taking Prozac BID, one in the morning and one at night. - Patient tolerating meds well without complication.  Denies S-E.  - Reviewed the "spokes of the wheel" of mood and health management.  Stressed the importance of ongoing prudent habits, including regular exercise, appropriate sleep hygiene, healthful dietary habits, and prayer/meditation to relax.  - Will continue to monitor.  6. Tobacco Abuse Disorder & Tobacco Abuse Counseling Told pt to think seriously about quitting smoking!  Told pt it is very important for his/her health and well being.    Smoking cessation instruction/counseling given:  counseled patient on the dangers of tobacco use, advised patient to stop smoking, and reviewed strategies to maximize success  Discussed with patient that there are multiple treatments to aid in quitting smoking, however I explained none will work unless pt really want to quit  Told to call 1-800-QUIT-NOW 502-371-0692(1-925-769-8823-669) for free smoking cessation counseling and support, or pt can go online to www.heart.org - the American Heart Association website and search "quit smoking ".   7. BMI Counseling Explained to patient what BMI refers to, and what it means medically.    Told patient to think about it as a "medical risk stratification measurement" and how increasing BMI is associated with increasing risk/ or worsening state of various diseases such as hypertension, hyperlipidemia, diabetes, premature OA, depression etc.  American Heart Association guidelines for healthy diet, basically Mediterranean diet, and exercise guidelines of 30 minutes 5 days per week or more discussed in  detail.  Health counseling performed.  All questions answered.  8. Lifestyle & Preventative Health Maintenance - Advised patient to continue working toward exercising to improve overall mental, physical, and emotional health.    -  Encouraged patient to engage in daily physical activity as tolerated, especially a formal exercise routine.  Recommended that the patient eventually strive for at least 150 minutes of moderate cardiovascular activity per week according to guidelines established by the [facility].   - Healthy dietary habits encouraged, including low-carb, and high amounts of lean protein in diet.   - Patient should also consume adequate amounts of water.    Orders Placed This Encounter  Procedures  . B12 and Folate Panel  . CBC with Differential/Platelet  . Comprehensive metabolic panel  . Hemoglobin A1c  . Lipid panel  . VITAMIN D 25 Hydroxy (Vit-D Deficiency, Fractures)  . TSH  . T4, free    Medications Discontinued During This Encounter  Medication Reason  . docusate sodium (COLACE) 100 MG capsule Patient Preference  . ondansetron (ZOFRAN) 4 MG tablet Completed Course  . topiramate (TOPAMAX) 50 MG tablet Completed Course     Gross side effects, risk and benefits, and alternatives of medications and treatment plan in general discussed with patient.  Patient is aware that all medications have potential side effects and we are unable to predict every side effect or drug-drug interaction that may occur.   Patient will call with any questions prior to using medication if they have concerns.    Expresses verbal understanding and consents to current therapy and treatment regimen.  No barriers to understanding were identified.  Red flag symptoms and signs discussed in detail.  Patient expressed understanding regarding what to do in case of emergency\urgent symptoms  Please see AVS handed out to patient at the end of our visit for further patient instructions/ counseling done pertaining to today's office visit.   Return for Hypertension follow up every 4 mo. she will come in for fasting blood work 1 week prior.  She will be due in March for her full set again     Note:  This note was prepared with assistance  of Conservation officer, historic buildings. Occasional wrong-word or sound-a-like substitutions may have occurred due to the inherent limitations of voice recognition software.  This document serves as a record of services personally performed by Thomasene Lot, DO. It was created on her behalf by Peggye Fothergill, a trained medical scribe. The creation of this record is based on the scribe's personal observations and the provider's statements to them.   I have reviewed the above medical documentation for accuracy and completeness and I concur.  Thomasene Lot, DO 11/27/2018 5:00 PM        ---------------------------------------------------------------------------------------------------------------------------------    Subjective:     HPI: [name] Barnett is a 42 y.o. female who presents to [facility] today for issues as discussed below.  Notes she has an ongoing cough "since she got sick," but otherwise unconcerned about this.  Physical Therapy & Physical Conditioning Had an ORIF of her right ankle back in July.  Notes that she "lost everything she learned [about gait] prior to that."  Does aquatics therapy one day per week "and I really like that."  Her mother says "I can tell a bigger difference in her doing the water therapy than her doing the physical therapy."  Patient does therapy three days per week.  Patient is going to ask for more days  in the pool than at the gym.  Feels that she is getting stronger and getting around better.  Recent Weight Loss - 26 lbs in two months Patient has been doing the treadmill and running errands with her mother.  She's lost 26 lbs since October.  When she uses the treadmill, she walks at least twice a day, for 30 minutes a time.  Says "walking's all I do."  "But my eating habits aren't good.  I don't eat breakfast, I don't eat lunch; my stomach don't growl.  My husband forces me to eat dinner."  Notes that her 43  year old daughter has lost 60 lbs, but it took her over a year.  Smoking Cessation Patient has been trying to cut back on smoking.  Mom confirms "dramatically, she has."  Patient is smoking less than a half pack of cigarettes per day.  Both the patient and her mother are trying to quit smoking.  Mood Notes "I'm not a depressed person, I just don't care."  She did not enjoy taking the Prozac 3 per day.  "I got confused what was real and what wasn't."  States "the dreaming, I would wake up, and later on, I didn't know what's real and what's not."  States "the dreams are so vivid when I take three, it just messes me up."  Now she takes one in the morning and one at night, as she was doing prior.  Notes she doesn't take her Topamax unless she has an absolutely horrible headache.  1. HTN HPI:  -  Her blood pressure may or may not be controlled at home.  Pt is not checking it at home.   Patient's blood pressure was elevated on intake today. Notes that she smoked right before she came in here.  - Patient reports good compliance with blood pressure medications  - Denies medication S-E   - Smoking Status noted.  She continues smoking up to a half pack per day.  - She denies new onset of: chest pain, exercise intolerance, shortness of breath, dizziness, visual changes, headache, lower extremity swelling or claudication.   Last 3 blood pressure readings in our office are as follows: BP Readings from Last 3 Encounters:  11/27/18 128/82  10/07/18 (!) 138/99  08/12/18 136/90    Filed Weights   11/27/18 1609  Weight: 144 lb (65.3 kg)    Wt Readings from Last 3 Encounters:  11/27/18 144 lb (65.3 kg)  10/07/18 170 lb (77.1 kg)  08/12/18 170 lb (77.1 kg)   BP Readings from Last 3 Encounters:  11/27/18 128/82  10/07/18 (!) 138/99  08/12/18 136/90   Pulse Readings from Last 3 Encounters:  11/27/18 80  10/07/18 85  08/12/18 82   BMI Readings from Last 3 Encounters:  11/27/18 24.72  kg/m  10/07/18 29.18 kg/m  08/12/18 29.18 kg/m     Patient Care Team    Relationship Specialty Notifications Start End  Thomasene Lotpalski, Brendan Gruwell, DO PCP - General Family Medicine  04/30/17   Charlott RakesSchooler, Vincent, MD Consulting Physician Gastroenterology  10/17/11   Lisbeth RenshawNundkumar, Neelesh, MD Consulting Physician Neurosurgery  05/21/17   Erick ColaceKirsteins, Andrew E, MD Consulting Physician Physical Medicine and Rehabilitation  05/21/17   Olivia Mackieaavon, Richard, MD Consulting Physician Obstetrics and Gynecology  05/21/17   Carmela RimaPatel, Narendra, MD Consulting Physician Ophthalmology  06/13/17    Comment: Neuro ophthalmologist  Sheral ApleyMurphy, Timothy D, MD Attending Physician Orthopedic Surgery  11/27/18      Patient Active Problem List   Diagnosis  Date Noted  . Neuritis-  R sided:  arm and leg/ body due to stroke 08/05/2017    Priority: High  . Elevated LDL cholesterol level 07/11/2017    Priority: High  . History of tobacco abuse-  30pk yr hx - quit 04/07/17 05/21/2017    Priority: High  . Benign essential HTN     Priority: High  . Basilar artery aneurysm ([name])     Priority: High  . 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 CVA (cerebral vascular accident) ([name]) 04/07/2017    Priority: High  . Vitamin D deficiency 05/29/2017    Priority: Medium  . Adjustment disorder with mixed anxiety and depressed mood     Priority: Medium  . 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 Gait disturbance, post-stroke 05/14/2017    Priority: Low  . Vitreous hemorrhage of right eye ([name])     Priority: Low  . s/p SAH (subarachnoid hemorrhage) ([name]) 04/19/2017    Priority: Low  . Duodenal ulcer, acute with obstruction 10/17/2011    Priority: Low  . Trimalleolar fracture of ankle, closed, right, initial encounter 11/27/2018  . Tobacco use disorder 11/27/2018  . Osteoarthritis of right hip 10/07/2018  . 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 Weakness with  dizziness-  since [facility] and CVA 04/07/17 08/07/2017  . Disturbances of vision, late effect of stroke 08/06/2017  . Health education/counseling 08/05/2017  . High risk medications (not anticoagulants) long-term use 08/05/2017  . [employer] of [employer] (NIH) Stroke Scale limb ataxia score 2, ataxia present in two limbs 06/25/2017  . Elevated vitamin B12 level 05/30/2017  . 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 Hypoxia   . Elevated gastrin level 01/25/2012  . Hypokalemia 01/21/2012  . Nausea & vomiting 01/20/2012  . Epigastric pain 01/20/2012  . S/P laparoscopic cholecystectomy 10/16/2011    Past Medical history, Surgical history, Family history, Social history, Allergies and Medications have been entered into the medical record, reviewed and changed as needed.    Current Meds  Medication Sig  . acetaminophen-codeine (TYLENOL #3) 300-30 MG tablet Take 1 tablet by mouth every 4 (four) hours as needed for moderate pain.  [name] amLODipine (NORVASC) 5 MG tablet Take 1 tablet (5 mg total) by mouth daily. Patient needs office visit for further refills  . atorvastatin (LIPITOR) 20 MG tablet TAKE 1 TABLET BY MOUTH EVERYDAY AT BEDTIME  . Coconut Oil 1000 MG CAPS Take 1,000 mg by mouth daily.  [name] FLUoxetine (PROZAC) 20 MG capsule Take 3 capsules (60 mg total) by mouth daily. (Patient taking differently: Take 40 mg by mouth daily. )  . gabapentin (NEURONTIN) 600 MG tablet Take 1 tablet (600 mg total) by mouth 4 (four) times daily. (Patient taking differently: Take 600 mg by mouth 2 (two) times daily. )  . methocarbamol (ROBAXIN) 500 MG tablet Take 1 tablet (500 mg total) by mouth 2 (two) times daily as needed for muscle spasms.  . Multiple Vitamin (MULTIVITAMIN WITH MINERALS) TABS tablet Take 1 tablet by mouth daily.  . niacin 500 MG tablet Take 500 mg by mouth at bedtime.  . Vitamin D, Ergocalciferol, (DRISDOL) 50000 units CAPS  capsule Take 1 capsule (50,000 Units total) by mouth every 7 (seven) days.    Allergies:  Allergies  Allergen Reactions  . 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   Objective:   Blood pressure 128/82, pulse 80, temperature 97.8 F (36.6 C), height 5\' 4"  (1.626 m), weight 144 lb (65.3 kg), SpO2 98 %. Body mass index is 24.72 kg/m. General:  Well Developed, well nourished, appropriate for stated age.  Neuro:  Alert and oriented,  extra-ocular muscles intact  HEENT:  Normocephalic, atraumatic, neck supple, no carotid bruits appreciated  Skin:  no gross rash, warm, pink. Cardiac:  RRR, S1 S2 Respiratory:  ECTA B/L and A/P, Not using accessory muscles, speaking in full sentences- unlabored. Vascular:  Ext warm, no cyanosis apprec.; cap RF less 2 sec. Psych:  No HI/SI, judgement and insight good, Euthymic mood. Full Affect.

## 2018-11-27 NOTE — Patient Instructions (Signed)
So glad you are doing so well [name] Barnett--  Keep up the great work!!!!      Please think seriously about quitting smoking!  This is very important for your health and well being.    Please let us know in the future if you are interested and ready to quit.   You can also call 1-800-QUIT-NOW 912-800-6291(1-9470523397-669) for free smoking cessation counseling and support.     Also, please go online to www.heart.org (the American Heart Association website) and search "quit smoking ".     Or try the centers for disease control website at: http://bell-fernandez.com/https://www.cdc.gov/tobacco/campaign/tips/quit-smoking  Or, go to the "national cancer institute" web site of NIH:  TodayAlert.dehttps://www.cancer.gov/publications/patient-education/clearing-the-air-pdf   There is a lot of great information on these websites for you to look over.      Want to Quit Smoking? FDA-Approved Products Can Help  Quitting smoking can be hard, but it is possible. In fact, every time you put out a cigarette is a new chance to try quitting again, according to the U.S. Food and Drug Administration's newest tobacco education campaign, "Every Try Counts."   If you want to quit-almost 70 percent of adult smokers say they do-you may want to use a "smoking cessation" product proven to help. Data has shown that using FDA-approved cessation medicine can double your chance of quitting successfully.  Some products contain nicotine as an active ingredient and others do not. These products include over-the-counter (OTC) options like skin patches, lozenges, and gum, as well as prescription medicines.  Smoking cessation products are intended to help you quit smoking. They are regulated through the [facility]'s Center for Drug Evaluation and Research, which ensures that the products are safe and effective and that their benefits outweigh any known associated risks.  The Benefits of Quitting Smoking No matter how much you smoke-or for how long-quitting will benefit you.  Not  only will you lower your risk of getting various cancers, including lung cancer, you'll also reduce your chances of having heart disease, a stroke, emphysema, and other serious diseases. Quitting also will lower the risk of heart disease and lung cancer in nonsmokers who otherwise would be exposed to your secondhand smoke.  Although there are benefits to quitting at any age, it is important to quit as soon as possible so your body can begin to heal from the damage caused by smoking. For instance, 12 hours after you quit smoking the carbon monoxide level in your blood drops to normal. Carbon monoxide is harmful because it displaces oxygen in the blood and deprives your heart, brain, and other vital organs of oxygen.  What To Know About Smoking Cessation Products Understanding how smoking cessation products work-and what side effects they may cause-can help you determine which product may be best for you.  If you're considering one of these products, reading labels and talking to your pharmacist and other health care providers are good first steps to take.  You also can check the FDA's website for more information on each product at Drugs@FDA , where you can search for each product by name.  And remember to weigh each product's benefits and risks, among other considerations.  About Nicotine Replacement Therapy (NRT) Nicotine is the substance primarily responsible for causing addiction to tobacco products. Tobacco users who are addicted to nicotine are used to having nicotine in their bodies.  As you try to quit smoking, you may have symptoms of nicotine withdrawal. When you quit, this withdrawal may cause symptoms like cravings, or urges, to  smoke; depression; trouble sleeping; irritability; anxiety; and increased appetite.  Nicotine withdrawal can discourage some smokers from continuing with a quit attempt. But the FDA has approved several smoking cessation products designed to help users gradually  withdraw from smoking (that is, "wean" themselves from smoking) by using specific amounts of nicotine that decrease over time. This type of product is called a "nicotine replacement therapy" or NRT. It supplies nicotine in controlled amounts while sparing you from other chemicals found in tobacco products.  NRTs are available over the counter and by prescription. You should generally use them only for a short time to help you manage nicotine cravings and withdrawal. However, the FDA recognizes that some people may need to use these products longer to stay smoke-free. Talk to your health care provider to determine the best course of treatment for you.  Over-the-counter NRTs are approved for sale to people age 43 and older. They are available under various brand names and sometimes as generic products. They include:  - Skin patches (also called "transdermal nicotine patches"). These patches are placed on the skin, similar to how you would apply an adhesive bandage. - Chewing gum (also called "nicotine gum"). This gum must be chewed according to the labeled instructions to be effective. - Lozenges (also called "nicotine lozenges"). You use these products by dissolving them in your mouth. For over-the-counter products, it's important to follow the instructions on the Drug Facts Label (DFL) and to read the enclosed User's Guide for complete directions and other important information. Ask your health care provider if you have questions.  Currently, prescription nicotine replacement therapy is available only under the brand name Nicotrol, and is available both as a nasal spray and an oral inhaler. The products are FDA-approved only for use by adults.  If you are under age 43 and want to quit smoking, talk to a health care professional about whether you should use nicotine replacement therapies.  Important Advice for People Considering Nicotine Replacement Therapy Women who are pregnant or breastfeeding  should talk to their health care providers and use nicotine replacement products only if the health care providers approve.  Also talk to your health care provider before using these products if you have:  diabetes, heart disease, asthma, or stomach ulcers; had a recent heart attack; high blood pressure that is not controlled with medicine; a history of irregular heartbeat; or been prescribed medication to help you quit smoking. If you take prescription medication for depression or asthma, tell your health care provider if you are quitting smoking because he or she may need to change your prescription dose.  Stop using a nicotine replacement product and call your health care professional if you have any of the following symptoms: nausea; dizziness; weakness; vomiting; fast or irregular heartbeat; mouth problems with the lozenge or gum; or redness or swelling of the skin around the patch that does not go away.  About Prescription Cessation Medicines Without Nicotine  The FDA has approved two smoking cessation products that do not contain nicotine. They are Chantix (varenicline tartrate) and Zyban (buproprion hydrochloride). Both are available in tablet form and by prescription only.  Chantix acts at sites in the brain affected by nicotine by reducing the rewarding effects of nicotine. The precise way that Zyban helps with smoking cessation is unknown.  As with other prescription products, the FDA has evaluated these medicines and found that the benefits outweigh the risks. For users taking these products, risks include changes in behavior, depressed mood, hostility,  aggression, and suicidal thoughts or actions.  The most common side effects of Chantix include nausea; constipation; gas; vomiting; and trouble sleeping or vivid, unusual, or strange dreams. Chantix also may change how you react to alcohol, so talk to your health care provider about your drinking habits (if you drink alcohol)  and whether these habits need to change. Chantix is not recommended for people under the age of 52.  The most commonly observed side effects consistently associated with the use of Zyban are dry mouth and insomnia.  Because Zyban contains the same active ingredient as the antidepressant Wellbutrin (bupropion), the FDA encourages people who use Zyban-and those who are considering it-to talk to their health care providers about the risks of treatment with antidepressant medicines. Zyban has not been studied in children under the age of 90 and is not approved for use in children and teenagers.  Note: If your health care provider prescribes Chantix or Zyban, please read the product's patient medication guide in its entirety. These guides offer important information on side effects, risks, warnings, product ingredients, and what you should talk about with your health care provider before taking the products.  Finally, if you ever have any side effects related to any smoking cessation products, or have any other problems related to your treatment, the FDA would like to hear from you. Please consider making a voluntary and confidential report to the FDA's MedWatch program.  Updated: November 19, 2016

## 2018-11-28 NOTE — Addendum Note (Signed)
Addended by: Leda MinPULLIAM, Marquese Burkland D on: 11/28/2018 02:00 PM   Modules accepted: Orders

## 2018-12-01 ENCOUNTER — Encounter: Payer: Self-pay | Admitting: Physical Therapy

## 2018-12-01 ENCOUNTER — Ambulatory Visit: Payer: BLUE CROSS/BLUE SHIELD | Admitting: Physical Therapy

## 2018-12-01 DIAGNOSIS — R2689 Other abnormalities of gait and mobility: Secondary | ICD-10-CM

## 2018-12-01 DIAGNOSIS — R278 Other lack of coordination: Secondary | ICD-10-CM

## 2018-12-01 DIAGNOSIS — R2681 Unsteadiness on feet: Secondary | ICD-10-CM

## 2018-12-01 NOTE — Therapy (Signed)
[facility] [address] [place], [place], [address] Phone: [phone]   Fax:  [phone]  Physical Therapy Treatment  Patient Details  Name: [name] Barnett MRN: [id number] Date of Birth: 10-Jan-1976 Referring Provider (PT): Edmonia Lynch, MD   Encounter Date: 12/01/2018  PT End of Session - 12/01/18 2046    Visit Number  19    Number of Visits  31    Date for PT Re-Evaluation  01/09/19    Authorization Type  [employer]    PT Start Time  1534    PT Stop Time  1620    PT Time Calculation (min)  46 min    Equipment Utilized During Treatment  Gait belt    Activity Tolerance  Patient tolerated treatment well    Behavior During Therapy  [facility] for tasks assessed/performed       Past Medical History:  Diagnosis Date  . Duodenal obstruction   . GERD (gastroesophageal reflux disease)   . Hypertension   . Stroke [facility])    april 2018, lt sided weakness  . Trimalleolar fracture of ankle, closed, right, initial encounter     Past Surgical History:  Procedure Laterality Date  . BALLOON DILATION  12/31/2011   Procedure: BALLOON DILATION;  Surgeon: Missy Sabins, MD;  Location: [facility] ENDOSCOPY;  Service: Endoscopy;  Laterality: N/A;  . CHOLECYSTECTOMY  07/28/11  . ESOPHAGOGASTRODUODENOSCOPY  10/17/2011   Procedure: ESOPHAGOGASTRODUODENOSCOPY (EGD);  Surgeon: Missy Sabins, MD;  Location: [facility] ENDOSCOPY;  Service: Endoscopy;  Laterality: N/A;  . IR ANGIO INTRA EXTRACRAN SEL INTERNAL CAROTID BILAT MOD SED  04/07/2017  . IR ANGIO INTRA EXTRACRAN SEL INTERNAL CAROTID BILAT MOD SED  01/07/2018  . IR ANGIO VERTEBRAL SEL VERTEBRAL UNI R MOD SED  04/07/2017  . IR ANGIO VERTEBRAL SEL VERTEBRAL UNI R MOD SED  01/07/2018  . IR ANGIOGRAM FOLLOW UP STUDY  04/07/2017  . IR ANGIOGRAM FOLLOW UP STUDY  04/07/2017  . IR ANGIOGRAM FOLLOW UP STUDY  04/07/2017  . IR ANGIOGRAM FOLLOW UP STUDY  04/07/2017  . IR ANGIOGRAM FOLLOW UP STUDY  04/07/2017  . IR ANGIOGRAM  SELECTIVE EACH ADDITIONAL VESSEL  04/07/2017  . IR TRANSCATH/EMBOLIZ  04/07/2017  . IR US GUIDE VASC ACCESS RIGHT  01/07/2018  . ORIF ANKLE FRACTURE Right 07/01/2018   Procedure: RIGHT ANKLE FRACTURE OPEN TREATMENT TRIMALLEOLAR INCLUDES INTERNAL FIXATION WITHOUT FIXATION OF POSTERIOR LIP, FRACTURE CLOSED TREATMENT TRIMALLEOLAR ANKLE WITH MANIPULATION, REPAIR SYNDESMOSIS, REPAIR DELTOID LIGAMENT;  Surgeon: Renette Butters, MD;  Location: [facility];  Service: Orthopedics;  Laterality: Right;  . PARS PLANA VITRECTOMY Right 05/01/2017   Procedure: PARS PLANA VITRECTOMY WITH 25 GAUGE RIGHT EYE, endolaser photocoaglation;  Surgeon: Jalene Mullet, MD;  Location: [facility];  Service: Ophthalmology;  Laterality: Right;  . RADIOLOGY WITH ANESTHESIA N/A 04/07/2017   Procedure: RADIOLOGY WITH ANESTHESIA;  Surgeon: Consuella Lose, MD;  Location: [facility];  Service: Radiology;  Laterality: N/A;  . REPLACEMENT TOTAL KNEE BILATERAL  2004  . TEMPOROMANDIBULAR JOINT SURGERY     2 surgeries  . TUMOR REMOVAL    . VAGOTOMY  01/23/2012   Procedure: VAGOTOMY, antrectomy and BII;  Surgeon: Haywood Lasso, MD;  Location: [facility];  Service: General;  Laterality: N/A;  Laparotomy with vagotomy.    There were no vitals filed for this visit.  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  No/denies                       [facility] Adult PT Treatment/Exercise - 12/01/18 1552      Ambulation/Gait   Ambulation/Gait  Yes    Ambulation/Gait  Assistance  4: Min assist    Ambulation/Gait Assistance Details  Pt's LUE supported by PT; cues to decrease gait speed; 2# weight used on LLE to decrease ataxia     Ambulation Distance (Feet)  230 Feet   2 consecutive laps around track   Assistive device  1 person hand held assist    Gait Pattern  Step-through pattern;Ataxic    Ambulation Surface  Level;Indoor        2# weight placed on LLE for balance exercises to decrease ataxic movement   Balance Exercises - 12/01/18 2017      Balance Exercises: Standing   Standing Eyes Opened  Wide (BOA);Head turns;Other (comment)   with reaching up with each UE -cues to turn head    Rockerboard  EO;Anterior/posterior;Other reps (comment)   3 sets 10 reps    Sidestepping  Upper extremity support;2 reps    Step Over Hurdles / Cones  stepping over black balance beam - 10 reps each leg with 1 UE support, progressing to no UE support                       Other Standing Exercises  Marching in place with 1 UE support, progressing to no UE suppport with smaller ROM                                   Pt performed alternate tap ups to balance beam - initially with UE support 10 reps each leg; with 1 UE support 10 reps  Each leg; 5 reps with minimal UE support (2 finger support) for improved coordination and improved SLS on each leg      PT Short Term Goals - 11/16/18 1722      PT SHORT TERM GOAL #1   Title  Pt will report ongoing compliance with HEP and fall prevention recommendations    Time  4    Period  Weeks    Status  New    Target Date  12/10/18      PT SHORT TERM GOAL #2   Title  Pt will improve gait speed to >/= 2.8 ft/esc indicative of community ambulation with LRAD     Baseline  2.73 ft/sec with RW    Time  4    Period  Weeks    Status  Revised    Target Date  12/10/18      PT SHORT TERM GOAL #3   Title  Pt will improve Berg score to >/= 42/56 indicating decreased fall risk potential     Baseline  39/56    Time  4    Period   Weeks    Status  Revised  Target Date  12/10/18      PT SHORT TERM GOAL #4   Title  Pt will increased TUG score to >/= 15 seconds indicating decreased risk for falls and improvement in functional mobility using LRAD    Baseline  20.6 with RW    Time  4    Period  Weeks    Status  Revised    Target Date  12/10/18      PT SHORT TERM GOAL #5   Title  Pt will report </= 5/10 pain in R hip during exercises, WB and gait    Time  4    Period  Weeks    Status  New    Target Date  12/10/18      PT SHORT TERM GOAL #6   Title  Patient will perform 12 steps with alternating stepping pattern using B HR's to improve community accessibility with close supervision    Time  4    Period  Weeks    Status  Revised    Target Date  12/10/18        PT Long Term Goals - 11/17/18 2142      PT LONG TERM GOAL #1   Title  Pt will demonstrate independence with land and aquatic HEP      PT LONG TERM GOAL #2   Title  Pt will improve Berg score to >/= 46/56 to decrease fall risk potential     Baseline  39/56 11/10/2018      PT LONG TERM GOAL #3   Title  Pt will improve gait speed to >/=3.0 ft/sec indicating significant change in functional mobility with LRAD     Baseline  Pt 2.73 ft/sec with RW 11/10/2018, improved just not to goal level      PT LONG TERM GOAL #4   Title  Pt will improve TUG score to </= 13.5 seconds using LRAD indicating decreased fall risk potential     Baseline  Pt performed with RW in 20.6 sec, improved just not to goal      PT LONG TERM GOAL #5   Title  Pt will negotiate 12 steps using B HR's and alternating stepping technique with supervision to improve community accessibility     Baseline  met 11-17-18    Status  Achieved      PT LONG TERM GOAL #6   Title  Pt will report ability to perform household ambulation using her RW with modified independence demonstrating significant improvement in functional mobility     Baseline  Reports intermittent use of rolling chair; and use  of RW dpending on the day.      PT LONG TERM GOAL #7   Title  Pt will ambulate outdoors over paved surfaces and perform curbs and inclines with supervision using LRAD for 300 feet to improve functional mobility with community distances       PT LONG TERM GOAL #8   Title  Pt will ambulate over level surfaces with her RW for 230 feet with supervision using LRAD demonstrating improvement in functional mobility             Plan - 12/01/18 2048    Clinical Impression Statement  Pt performed standing balance activities inside // bars for UE support prn with use of 2# weight on LLE to decrease ataxia, which seemed to improve control and decrease ataxic movement;  LLE remains weaker and more ataxic than RLE      Rehab Potential  Good  Clinical Impairments Affecting Rehab Potential  high risk for falls due to abnormal gait, ataxia, sensory changes, and impaired cognition.     PT Frequency  2x / week    PT Duration  8 weeks    PT Treatment/Interventions  ADLs/Self Care Home Management;Aquatic Therapy;DME Instruction;Gait training;Stair training;Functional mobility training;Therapeutic activities;Therapeutic exercise;Balance training;Neuromuscular re-education;Patient/family education;Cognitive remediation;Orthotic Fit/Training;Electrical Stimulation;Cryotherapy;Moist Heat;Manual techniques;Passive range of motion;Taping;Energy conservation;Dry needling    PT Next Visit Plan  TDN to lumbar multifidus; proximal stability in supine, tall kneeling and standing, focus on decreased use of lumbar extensors during gait, cont to work on balance, gait and trunk stabilization activities     PT Home Exercise Plan  Access Code: [email or web address]     Consulted and Agree with Plan of Care  Patient;Family member/caregiver    Family Member Consulted  Spouse       Patient will benefit from skilled therapeutic intervention in order to improve the following deficits and impairments:  Abnormal gait, Decreased balance,  Decreased cognition, Decreased coordination, Decreased strength, Difficulty walking, Impaired sensation, Pain, Decreased activity tolerance, Decreased mobility, Decreased endurance, Postural dysfunction, Decreased knowledge of use of DME, Impaired perceived functional ability, Decreased range of motion, Impaired flexibility  Visit Diagnosis: Unsteadiness on feet  Other abnormalities of gait and mobility  Other lack of coordination     Problem List Patient Active Problem List   Diagnosis Date Noted  . Trimalleolar fracture of ankle, closed, right, initial encounter 11/27/2018  . Tobacco use disorder 11/27/2018  . Osteoarthritis of right hip 10/07/2018  . 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 Weakness with dizziness-  since [facility] and CVA 04/07/17 08/07/2017  . Disturbances of vision, late effect of stroke 08/06/2017  . Health education/counseling 08/05/2017  . High risk medications (not anticoagulants) long-term use 08/05/2017  . Neuritis-  R sided:  arm and leg/ body due to stroke 08/05/2017  . Elevated LDL cholesterol level 07/11/2017  . [employer] (NIH) Stroke Scale limb ataxia score 2, ataxia present in two limbs 06/25/2017  . 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 Elevated vitamin B12 level 05/30/2017  . Vitamin D deficiency 05/29/2017  . History of tobacco abuse-  30pk yr hx - quit 04/07/17 05/21/2017  . Gait disturbance, post-stroke 05/14/2017  . Benign essential HTN   . Vitreous hemorrhage of right eye ([place])   . Adjustment disorder with mixed anxiety and depressed mood   . 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 s/p SAH (subarachnoid hemorrhage) ([place]) 04/19/2017  . 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 Hypoxia   . Subarachnoid hemorrhage due to ruptured aneurysm ([place]) 04/07/2017  . CVA (cerebral vascular accident) ([place]) 04/07/2017  . Elevated gastrin level 01/25/2012  .  Hypokalemia 01/21/2012  . Nausea & vomiting 01/20/2012  . Epigastric pain 01/20/2012  . Duodenal ulcer, acute with obstruction 10/17/2011  . S/P laparoscopic cholecystectomy 10/16/2011    Alda Lea, PT 12/01/2018, 9:06 PM  [facility] [address] [place], [place], [address] Phone: [phone]   Fax:  [phone]  Name: [name] Barnett MRN: [id number] Date of Birth: [birth year]

## 2018-12-08 ENCOUNTER — Encounter: Payer: Self-pay | Admitting: Physical Therapy

## 2018-12-08 ENCOUNTER — Ambulatory Visit: Payer: BLUE CROSS/BLUE SHIELD | Admitting: Physical Therapy

## 2018-12-08 DIAGNOSIS — R2689 Other abnormalities of gait and mobility: Secondary | ICD-10-CM

## 2018-12-08 DIAGNOSIS — R2681 Unsteadiness on feet: Secondary | ICD-10-CM

## 2018-12-08 DIAGNOSIS — R278 Other lack of coordination: Secondary | ICD-10-CM

## 2018-12-08 NOTE — Therapy (Signed)
[facility] [address] [place], [place], [address] Phone: [phone]   Fax:  [phone]  Physical Therapy Treatment  Patient Details  Name: [name] Barnett MRN: [id number] Date of Birth: 07/03/1976 Referring Provider (PT): Edmonia Lynch, MD   Encounter Date: 12/08/2018  PT End of Session - 12/08/18 1538    Visit Number  20    Number of Visits  31    Date for PT Re-Evaluation  01/09/19    Authorization Type  [employer]    PT Start Time  1532    PT Stop Time  1615    PT Time Calculation (min)  43 min    Equipment Utilized During Treatment  Gait belt    Activity Tolerance  Patient tolerated treatment well    Behavior During Therapy  [facility] for tasks assessed/performed       Past Medical History:  Diagnosis Date  . Duodenal obstruction   . GERD (gastroesophageal reflux disease)   . Hypertension   . Stroke [facility])    april 2018, lt sided weakness  . Trimalleolar fracture of ankle, closed, right, initial encounter     Past Surgical History:  Procedure Laterality Date  . BALLOON DILATION  12/31/2011   Procedure: BALLOON DILATION;  Surgeon: Missy Sabins, MD;  Location: [facility] ENDOSCOPY;  Service: Endoscopy;  Laterality: N/A;  . CHOLECYSTECTOMY  07/28/11  . ESOPHAGOGASTRODUODENOSCOPY  10/17/2011   Procedure: ESOPHAGOGASTRODUODENOSCOPY (EGD);  Surgeon: Missy Sabins, MD;  Location: [facility] ENDOSCOPY;  Service: Endoscopy;  Laterality: N/A;  . IR ANGIO INTRA EXTRACRAN SEL INTERNAL CAROTID BILAT MOD SED  04/07/2017  . IR ANGIO INTRA EXTRACRAN SEL INTERNAL CAROTID BILAT MOD SED  01/07/2018  . IR ANGIO VERTEBRAL SEL VERTEBRAL UNI R MOD SED  04/07/2017  . IR ANGIO VERTEBRAL SEL VERTEBRAL UNI R MOD SED  01/07/2018  . IR ANGIOGRAM FOLLOW UP STUDY  04/07/2017  . IR ANGIOGRAM FOLLOW UP STUDY  04/07/2017  . IR ANGIOGRAM FOLLOW UP STUDY  04/07/2017  . IR ANGIOGRAM FOLLOW UP STUDY  04/07/2017  . IR ANGIOGRAM FOLLOW UP STUDY  04/07/2017  . IR ANGIOGRAM  SELECTIVE EACH ADDITIONAL VESSEL  04/07/2017  . IR TRANSCATH/EMBOLIZ  04/07/2017  . IR US GUIDE VASC ACCESS RIGHT  01/07/2018  . ORIF ANKLE FRACTURE Right 07/01/2018   Procedure: RIGHT ANKLE FRACTURE OPEN TREATMENT TRIMALLEOLAR INCLUDES INTERNAL FIXATION WITHOUT FIXATION OF POSTERIOR LIP, FRACTURE CLOSED TREATMENT TRIMALLEOLAR ANKLE WITH MANIPULATION, REPAIR SYNDESMOSIS, REPAIR DELTOID LIGAMENT;  Surgeon: Renette Butters, MD;  Location: [place];  Service: Orthopedics;  Laterality: Right;  . PARS PLANA VITRECTOMY Right 05/01/2017   Procedure: PARS PLANA VITRECTOMY WITH 25 GAUGE RIGHT EYE, endolaser photocoaglation;  Surgeon: Jalene Mullet, MD;  Location: [place];  Service: Ophthalmology;  Laterality: Right;  . RADIOLOGY WITH ANESTHESIA N/A 04/07/2017   Procedure: RADIOLOGY WITH ANESTHESIA;  Surgeon: Consuella Lose, MD;  Location: [place];  Service: Radiology;  Laterality: N/A;  . REPLACEMENT TOTAL KNEE BILATERAL  2004  . TEMPOROMANDIBULAR JOINT SURGERY     2 surgeries  . TUMOR REMOVAL    . VAGOTOMY  01/23/2012   Procedure: VAGOTOMY, antrectomy and BII;  Surgeon: Haywood Lasso, MD;  Location: [place];  Service: General;  Laterality: N/A;  Laparotomy with vagotomy.    There were no vitals filed for this visit.  Subjective Assessment - 12/08/18 1535    Subjective  Reports increased hip/groin pain that started yesterday when she woke up, was 25/10  yesterday, down to 8/10 today. Using [employer]macare creame which is helping some today.     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  Yes    Pain Score  8     Pain Location  Leg   groin/adductors   Pain Orientation  Right    Pain Descriptors / Indicators   Aching;Tightness    Pain Type  Chronic pain    Pain Radiating Towards  down leg at times    Pain Onset  More than a month ago    Pain Frequency  Constant    Aggravating Factors   sitting too long    Pain Relieving Factors  movement helps, stretching, meds, topical creame and exercises         [facility] PT Assessment - 12/08/18 1543      Ambulation/Gait   Ambulation/Gait  Yes    Ambulation/Gait Assistance  5: Supervision;4: Min guard    Ambulation/Gait Assistance Details  cues to slow down, focus on step placement and stay with walker    Ambulation Distance (Feet)  110 Feet   x2, plus around gym with testing   Assistive device  Rolling walker    Gait Pattern  Step-through pattern;Ataxic    Ambulation Surface  Level;Indoor    Stairs  Yes    Stairs Assistance  5: Supervision    Stairs Assistance Details (indicate cue type and reason)  cues on weight shifting    Stair Management Technique  Two rails;Alternating pattern;Forwards    Number of Stairs  4   x3 reps     Standardized Balance Assessment   Standardized Balance Assessment  Timed Up and Go Test;Berg Balance Test    10 Meter Walk  13.75 sec's= 2.39 ft/sec with RW, min assist at times for balance      Berg Balance Test   Sit to Stand  Able to stand without using hands and stabilize independently    Standing Unsupported  Able to stand safely 2 minutes    Sitting with Back Unsupported but Feet Supported on Floor or Stool  Able to sit safely and securely 2 minutes    Stand to Sit  Sits safely with minimal use of hands    Transfers  Able to transfer safely, definite need of hands    Standing Unsupported with Eyes Closed  Able to stand 10 seconds safely    Standing Ubsupported with Feet Together  Able to place feet together independently and stand 1 minute safely    From Standing, Reach Forward with Outstretched Arm  Can reach confidently >25 cm (10")    From Standing Position, Pick up Object from Floor  Able to pick up shoe safely  and easily    From Standing Position, Turn to Look Behind Over each Shoulder  Looks behind from both sides and weight shifts well    Turn 360 Degrees  Needs assistance while turning    Standing Unsupported, Alternately Place Feet on Step/Stool  Able to complete 4 steps without aid or supervision    Standing Unsupported, One Foot in Front  Able to take small step independently and hold 30 seconds    Standing on One Leg  Able to lift  leg independently and hold equal to or more than 3 seconds    Total Score  45      Timed Up and Go Test   TUG  Normal TUG    Normal TUG (seconds)  20.35   x1, 18.19 sec's 2cd time with RW, up to min assist              PT Short Term Goals - 12/08/18 1539      PT SHORT TERM GOAL #1   Title  Pt will report ongoing compliance with HEP and fall prevention recommendations    Baseline  12/08/18: reports she has not been doing the HEP.    Status  Not Met      PT SHORT TERM GOAL #2   Title  Pt will improve gait speed to >/= 2.8 ft/esc indicative of community ambulation with LRAD     Baseline  12/08/18: pt's gait speed decreased to 2.39 ft/sec with RW, was 2.73 ft/sec when last checked with RW    Time  --    Period  --    Status  Not Met      PT SHORT TERM GOAL #3   Title  Pt will improve Berg score to >/= 42/56 indicating decreased fall risk potential     Baseline  12/08/18: 45/56 scored today    Time  --    Period  --    Status  Achieved      PT SHORT TERM GOAL #4   Title  Pt will increased TUG score to >/= 15 seconds indicating decreased risk for falls and improvement in functional mobility using LRAD    Baseline  12/08/18: 20.35 sec's with RW, improved just not to goal    Time  --    Period  --    Status  Partially Met      PT SHORT TERM GOAL #5   Title  Pt will report </= 5/10 pain in R hip during exercises, WB and gait    Baseline  12/08/18: 8/10 before and after session    Time  --    Period  --    Status  Not Met      PT SHORT TERM  GOAL #6   Title  Patient will perform 12 steps with alternating stepping pattern using B HR's to improve community accessibility with close supervision    Baseline  12/08/18: met today    Time  --    Period  --    Status  Achieved        PT Long Term Goals - 11/17/18 2142      PT LONG TERM GOAL #1   Title  Pt will demonstrate independence with land and aquatic HEP      PT LONG TERM GOAL #2   Title  Pt will improve Berg score to >/= 46/56 to decrease fall risk potential     Baseline  39/56 11/10/2018      PT LONG TERM GOAL #3   Title  Pt will improve gait speed to >/=3.0 ft/sec indicating significant change in functional mobility with LRAD     Baseline  Pt 2.73 ft/sec with RW 11/10/2018, improved just not to goal level      PT LONG TERM GOAL #4   Title  Pt will improve TUG score to </= 13.5 seconds using LRAD indicating decreased fall risk potential     Baseline  Pt performed with RW in 20.6 sec, improved just  not to goal      PT LONG TERM GOAL #5   Title  Pt will negotiate 12 steps using B HR's and alternating stepping technique with supervision to improve community accessibility     Baseline  met 11-17-18    Status  Achieved      PT LONG TERM GOAL #6   Title  Pt will report ability to perform household ambulation using her RW with modified independence demonstrating significant improvement in functional mobility     Baseline  Reports intermittent use of rolling chair; and use of RW dpending on the day.      PT LONG TERM GOAL #7   Title  Pt will ambulate outdoors over paved surfaces and perform curbs and inclines with supervision using LRAD for 300 feet to improve functional mobility with community distances       PT LONG TERM GOAL #8   Title  Pt will ambulate over level surfaces with her RW for 230 feet with supervision using LRAD demonstrating improvement in functional mobility             Plan - 12/08/18 1538    Clinical Impression Statement  Today's skilled session  focused on progress toward STGs with pt meeting the [facility] Balance Test score goal and her stair goal. All other goals where partially to not met. The pt should benefit from continued PT to progress toward unmet goals.     Rehab Potential  Good    Clinical Impairments Affecting Rehab Potential  high risk for falls due to abnormal gait, ataxia, sensory changes, and impaired cognition.     PT Frequency  2x / week    PT Duration  8 weeks    PT Treatment/Interventions  ADLs/Self Care Home Management;Aquatic Therapy;DME Instruction;Gait training;Stair training;Functional mobility training;Therapeutic activities;Therapeutic exercise;Balance training;Neuromuscular re-education;Patient/family education;Cognitive remediation;Orthotic Fit/Training;Electrical Stimulation;Cryotherapy;Moist Heat;Manual techniques;Passive range of motion;Taping;Energy conservation;Dry needling    PT Next Visit Plan  TDN to lumbar multifidus; proximal stability in supine, tall kneeling and standing, focus on decreased use of lumbar extensors during gait, cont to work on balance, gait and trunk stabilization activities     PT Home Exercise Plan  Access Code: [email or web address]     Consulted and Agree with Plan of Care  Patient;Family member/caregiver    Family Member Consulted  Spouse       Patient will benefit from skilled therapeutic intervention in order to improve the following deficits and impairments:  Abnormal gait, Decreased balance, Decreased cognition, Decreased coordination, Decreased strength, Difficulty walking, Impaired sensation, Pain, Decreased activity tolerance, Decreased mobility, Decreased endurance, Postural dysfunction, Decreased knowledge of use of DME, Impaired perceived functional ability, Decreased range of motion, Impaired flexibility  Visit Diagnosis: Unsteadiness on feet  Other abnormalities of gait and mobility  Other lack of coordination     Problem List Patient Active Problem List   Diagnosis Date  Noted  . Trimalleolar fracture of ankle, closed, right, initial encounter 11/27/2018  . Tobacco use disorder 11/27/2018  . Osteoarthritis of right hip 10/07/2018  . 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 Weakness with dizziness-  since [facility] and CVA 04/07/17 08/07/2017  . Disturbances of vision, late effect of stroke 08/06/2017  . Health education/counseling 08/05/2017  . High risk medications (not anticoagulants) long-term use 08/05/2017  . Neuritis-  R sided:  arm and leg/ body due to stroke 08/05/2017  . Elevated LDL cholesterol level 07/11/2017  . [employer] (NIH) Stroke Scale limb ataxia score 2, ataxia present in two limbs  06/25/2017  . 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 Elevated vitamin B12 level 05/30/2017  . Vitamin D deficiency 05/29/2017  . History of tobacco abuse-  30pk yr hx - quit 04/07/17 05/21/2017  . Gait disturbance, post-stroke 05/14/2017  . Benign essential HTN   . Vitreous hemorrhage of right eye ([place])   . Adjustment disorder with mixed anxiety and depressed mood   . 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 s/p SAH (subarachnoid hemorrhage) ([place]) 04/19/2017  . 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 Hypoxia   . Subarachnoid hemorrhage due to ruptured aneurysm ([place]) 04/07/2017  . CVA (cerebral vascular accident) ([place]) 04/07/2017  . Elevated gastrin level 01/25/2012  . Hypokalemia 01/21/2012  . Nausea & vomiting 01/20/2012  . Epigastric pain 01/20/2012  . Duodenal ulcer, acute with obstruction 10/17/2011  . S/P laparoscopic cholecystectomy 10/16/2011    Willow Ora, PTA, [facility] Outpatient Neuro [facility] [address] [place], [place] [address] [phone] 12/08/18, 4:35 PM   Name: GABRIELA IRIGOYEN MRN: [id number] Date of Birth: 08-09-1976

## 2018-12-12 ENCOUNTER — Encounter: Payer: Self-pay | Admitting: Physical Therapy

## 2018-12-12 ENCOUNTER — Ambulatory Visit: Payer: BLUE CROSS/BLUE SHIELD | Attending: Orthopedic Surgery | Admitting: Physical Therapy

## 2018-12-12 DIAGNOSIS — M79651 Pain in right thigh: Secondary | ICD-10-CM

## 2018-12-12 DIAGNOSIS — R278 Other lack of coordination: Secondary | ICD-10-CM

## 2018-12-12 DIAGNOSIS — R2689 Other abnormalities of gait and mobility: Secondary | ICD-10-CM | POA: Diagnosis present

## 2018-12-12 DIAGNOSIS — R270 Ataxia, unspecified: Secondary | ICD-10-CM | POA: Insufficient documentation

## 2018-12-12 DIAGNOSIS — R2681 Unsteadiness on feet: Secondary | ICD-10-CM

## 2018-12-12 DIAGNOSIS — R260 Ataxic gait: Secondary | ICD-10-CM | POA: Diagnosis not present

## 2018-12-12 DIAGNOSIS — R208 Other disturbances of skin sensation: Secondary | ICD-10-CM | POA: Insufficient documentation

## 2018-12-12 DIAGNOSIS — I69054 Hemiplegia and hemiparesis following nontraumatic subarachnoid hemorrhage affecting left non-dominant side: Secondary | ICD-10-CM | POA: Diagnosis present

## 2018-12-12 DIAGNOSIS — R293 Abnormal posture: Secondary | ICD-10-CM | POA: Diagnosis present

## 2018-12-12 DIAGNOSIS — M6281 Muscle weakness (generalized): Secondary | ICD-10-CM | POA: Insufficient documentation

## 2018-12-12 NOTE — Therapy (Signed)
[facility] [address] [place], [place], [address] Phone: [phone]   Fax:  [phone]  Physical Therapy Treatment  Patient Details  Name: [name] Barnett MRN: [id number] Date of Birth: [birth year] Referring Provider (PT): Edmonia Lynch, MD   Encounter Date: 12/12/2018  PT End of Session - 12/12/18 1642    Visit Number  21    Number of Visits  31    Date for PT Re-Evaluation  01/09/19    Authorization Type  [employer]    PT Start Time  1530    PT Stop Time  1615    PT Time Calculation (min)  45 min    Activity Tolerance  Patient limited by pain    Behavior During Therapy  [facility] for tasks assessed/performed       Past Medical History:  Diagnosis Date  . Duodenal obstruction   . GERD (gastroesophageal reflux disease)   . Hypertension   . Stroke [facility])    april 2018, lt sided weakness  . Trimalleolar fracture of ankle, closed, right, initial encounter     Past Surgical History:  Procedure Laterality Date  . BALLOON DILATION  12/31/2011   Procedure: BALLOON DILATION;  Surgeon: Missy Sabins, MD;  Location: [facility] ENDOSCOPY;  Service: Endoscopy;  Laterality: N/A;  . CHOLECYSTECTOMY  07/28/11  . ESOPHAGOGASTRODUODENOSCOPY  10/17/2011   Procedure: ESOPHAGOGASTRODUODENOSCOPY (EGD);  Surgeon: Missy Sabins, MD;  Location: [facility] ENDOSCOPY;  Service: Endoscopy;  Laterality: N/A;  . IR ANGIO INTRA EXTRACRAN SEL INTERNAL CAROTID BILAT MOD SED  04/07/2017  . IR ANGIO INTRA EXTRACRAN SEL INTERNAL CAROTID BILAT MOD SED  01/07/2018  . IR ANGIO VERTEBRAL SEL VERTEBRAL UNI R MOD SED  04/07/2017  . IR ANGIO VERTEBRAL SEL VERTEBRAL UNI R MOD SED  01/07/2018  . IR ANGIOGRAM FOLLOW UP STUDY  04/07/2017  . IR ANGIOGRAM FOLLOW UP STUDY  04/07/2017  . IR ANGIOGRAM FOLLOW UP STUDY  04/07/2017  . IR ANGIOGRAM FOLLOW UP STUDY  04/07/2017  . IR ANGIOGRAM FOLLOW UP STUDY  04/07/2017  . IR ANGIOGRAM SELECTIVE EACH ADDITIONAL VESSEL  04/07/2017  . IR  TRANSCATH/EMBOLIZ  04/07/2017  . IR US GUIDE VASC ACCESS RIGHT  01/07/2018  . ORIF ANKLE FRACTURE Right 07/01/2018   Procedure: RIGHT ANKLE FRACTURE OPEN TREATMENT TRIMALLEOLAR INCLUDES INTERNAL FIXATION WITHOUT FIXATION OF POSTERIOR LIP, FRACTURE CLOSED TREATMENT TRIMALLEOLAR ANKLE WITH MANIPULATION, REPAIR SYNDESMOSIS, REPAIR DELTOID LIGAMENT;  Surgeon: Renette Butters, MD;  Location: [place];  Service: Orthopedics;  Laterality: Right;  . PARS PLANA VITRECTOMY Right 05/01/2017   Procedure: PARS PLANA VITRECTOMY WITH 25 GAUGE RIGHT EYE, endolaser photocoaglation;  Surgeon: Jalene Mullet, MD;  Location: [place];  Service: Ophthalmology;  Laterality: Right;  . RADIOLOGY WITH ANESTHESIA N/A 04/07/2017   Procedure: RADIOLOGY WITH ANESTHESIA;  Surgeon: Consuella Lose, MD;  Location: [place];  Service: Radiology;  Laterality: N/A;  . REPLACEMENT TOTAL KNEE BILATERAL  2004  . TEMPOROMANDIBULAR JOINT SURGERY     2 surgeries  . TUMOR REMOVAL    . VAGOTOMY  01/23/2012   Procedure: VAGOTOMY, antrectomy and BII;  Surgeon: Haywood Lasso, MD;  Location: [facility];  Service: General;  Laterality: N/A;  Laparotomy with vagotomy.    There were no vitals filed for this visit.  Subjective Assessment - 12/12/18 1535    Subjective  Dry needling of low back did not seem to help.  Pain continues to be severe in the morning.  Does not feel  the pain in the water but it comes back when she is back on land.    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  Yes    Pain Score  8     Pain Location  Hip    Pain Orientation  Right    Pain Descriptors / Indicators  Sore    Pain Type  Neuropathic pain    Pain Onset  More than a month ago                        [facility] Adult PT Treatment/Exercise - 12/12/18 1633      Neuro Re-ed    Neuro Re-ed Details   NMR on mat on floor for core stability and postural control during dynamic UE and LE movement and weight shifting.  Performed rounds of cat <> cow, core stabilization while rocking weight forwards to UE <> back to LE in quadruped, maintaining core activation during 5 reps rotation to each side with UE horizontal ABD and maintaining core activation with alternating LE extension.  Required cues in quadruped to maintain core activation to prevent lumbar extension and pelvic drop.  Transitioned to tall kneeling and performed active diagonal weight shifting with RLE forwards and then LLE forwards (staggered tall kneeling stance), progressed to performing against therapist's manual resistance for increased activation.  Combined weight shifting and stabilization with contralateral LE advancement with greatest focus on maintaining upright trunk when shifting weight to LLE and extending RLE (pt compensates with pitching her trunk forwards) - performed in blocked practice with pt demonstrating improved control and more upright trunk with repetition.        Lumbar Exercises: Supine   Other Supine Lumbar Exercises  Supine on floor with LE at 90/90 resting on mat x 5 minutes for lumbar decompression.  Pt reported improvement in thigh pain with decompression and localization of pain to posterior pelvis.  Advised pt to perform 10-12 minutes at home             PT Education - 12/12/18 1641    Education provided  Yes    Education Details  spinal decompression exercise with LE at 90/90; will perform TDN next session if pain has not improved    Person(s) Educated  Patient;Parent(s)    Methods  Explanation;Demonstration    Comprehension  Verbalized understanding;Returned demonstration       PT Short Term Goals - 12/08/18 1539      PT SHORT TERM GOAL #1   Title  Pt will report  ongoing compliance with HEP and fall prevention recommendations    Baseline  12/08/18: reports she has not been doing the HEP.    Status  Not Met      PT SHORT TERM GOAL #2   Title  Pt will improve gait speed to >/= 2.8 ft/esc indicative of community ambulation with LRAD     Baseline  12/08/18: pt's gait speed decreased to 2.39 ft/sec with RW, was 2.73 ft/sec when last checked with RW    Time  --    Period  --    Status  Not Met      PT SHORT TERM GOAL #3  Title  Pt will improve Berg score to >/= 42/56 indicating decreased fall risk potential     Baseline  12/08/18: 45/56 scored today    Time  --    Period  --    Status  Achieved      PT SHORT TERM GOAL #4   Title  Pt will increased TUG score to >/= 15 seconds indicating decreased risk for falls and improvement in functional mobility using LRAD    Baseline  12/08/18: 20.35 sec's with RW, improved just not to goal    Time  --    Period  --    Status  Partially Met      PT SHORT TERM GOAL #5   Title  Pt will report </= 5/10 pain in R hip during exercises, WB and gait    Baseline  12/08/18: 8/10 before and after session    Time  --    Period  --    Status  Not Met      PT SHORT TERM GOAL #6   Title  Patient will perform 12 steps with alternating stepping pattern using B HR's to improve community accessibility with close supervision    Baseline  12/08/18: met today    Time  --    Period  --    Status  Achieved        PT Long Term Goals - 12/12/18 1653      PT LONG TERM GOAL #1   Title  Pt will demonstrate independence with land and aquatic HEP    Target Date  01/09/19      PT LONG TERM GOAL #2   Title  Pt will improve Berg score to >/= 46/56 to decrease fall risk potential     Baseline  39/56 11/10/2018    Target Date  01/09/19      PT LONG TERM GOAL #3   Title  Pt will improve gait speed to >/=3.0 ft/sec indicating significant change in functional mobility with LRAD     Baseline  Pt 2.73 ft/sec with RW 11/10/2018,  improved just not to goal level    Target Date  01/09/19      PT LONG TERM GOAL #4   Title  Pt will improve TUG score to </= 13.5 seconds using LRAD indicating decreased fall risk potential     Baseline  Pt performed with RW in 20.6 sec, improved just not to goal      PT LONG TERM GOAL #5   Title  Pt will negotiate 12 steps using B HR's and alternating stepping technique with supervision to improve community accessibility     Baseline  met 11-17-18    Status  Achieved      PT LONG TERM GOAL #6   Title  Pt will report ability to perform household ambulation using her RW with modified independence demonstrating significant improvement in functional mobility     Baseline  Reports intermittent use of rolling chair; and use of RW dpending on the day.    Target Date  01/09/19      PT LONG TERM GOAL #7   Title  Pt will ambulate outdoors over paved surfaces and perform curbs and inclines with supervision using LRAD for 300 feet to improve functional mobility with community distances     Target Date  01/09/19      PT LONG TERM GOAL #8   Title  --    Target Date  --  Plan - 12/12/18 1642    Clinical Impression Statement  Treatment session focused on addressing patient's increase in RLE pain with core stabilization training and NMR for postural control during weight shifting and LE advancement.  Also added in spinal decompression exercise.  At end of session with manual pelvic stabilization pt continues to demonstrate improved coordination, balance and gait sequencing.  If pain persists will return to dry needling next session.  Will continue to address and progress towards LTG.    Rehab Potential  Good    Clinical Impairments Affecting Rehab Potential  high risk for falls due to abnormal gait, ataxia, sensory changes, and impaired cognition.     PT Frequency  2x / week    PT Duration  8 weeks    PT Treatment/Interventions  ADLs/Self Care Home Management;Aquatic Therapy;DME  Instruction;Gait training;Stair training;Functional mobility training;Therapeutic activities;Therapeutic exercise;Balance training;Neuromuscular re-education;Patient/family education;Cognitive remediation;Orthotic Fit/Training;Electrical Stimulation;Cryotherapy;Moist Heat;Manual techniques;Passive range of motion;Taping;Energy conservation;Dry needling    PT Next Visit Plan  How did she tolerate lumbar decompression at home?  use position to do modified pilates?  TDN to lumbar multifidus; proximal stability in supine, tall kneeling and standing, focus on decreased use of lumbar extensors during gait, cont to work on balance, gait and trunk stabilization activities     PT Home Exercise Plan  Access Code: [email or web address]     Consulted and Agree with Plan of Care  Patient;Family member/caregiver    Family Member Consulted  Spouse       Patient will benefit from skilled therapeutic intervention in order to improve the following deficits and impairments:  Abnormal gait, Decreased balance, Decreased cognition, Decreased coordination, Decreased strength, Difficulty walking, Impaired sensation, Pain, Decreased activity tolerance, Decreased mobility, Decreased endurance, Postural dysfunction, Decreased knowledge of use of DME, Impaired perceived functional ability, Decreased range of motion, Impaired flexibility  Visit Diagnosis: Ataxic gait  Other lack of coordination  Other abnormalities of gait and mobility  Unsteadiness on feet  Pain in right thigh     Problem List Patient Active Problem List   Diagnosis Date Noted  . Trimalleolar fracture of ankle, closed, right, initial encounter 11/27/2018  . Tobacco use disorder 11/27/2018  . Osteoarthritis of right hip 10/07/2018  . 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 Weakness with dizziness-  since [facility] and CVA 04/07/17 08/07/2017  . Disturbances of vision, late effect of stroke 08/06/2017  . Health education/counseling 08/05/2017  . High risk medications  (not anticoagulants) long-term use 08/05/2017  . Neuritis-  R sided:  arm and leg/ body due to stroke 08/05/2017  . Elevated LDL cholesterol level 07/11/2017  . [employer] (NIH) Stroke Scale limb ataxia score 2, ataxia present in two limbs 06/25/2017  . 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 Elevated vitamin B12 level 05/30/2017  . Vitamin D deficiency 05/29/2017  . History of tobacco abuse-  30pk yr hx - quit 04/07/17 05/21/2017  . Gait disturbance, post-stroke 05/14/2017  . Benign essential HTN   . Vitreous hemorrhage of right eye ([place])   . Adjustment disorder with mixed anxiety and depressed mood   . 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 s/p SAH (subarachnoid hemorrhage) ([place]) 04/19/2017  . 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 Hypoxia   . Subarachnoid hemorrhage due to ruptured aneurysm ([place]) 04/07/2017  . CVA (cerebral vascular accident) ([place]) 04/07/2017  . Elevated gastrin level 01/25/2012  . Hypokalemia 01/21/2012  .  Nausea & vomiting 01/20/2012  . Epigastric pain 01/20/2012  . Duodenal ulcer, acute with obstruction 10/17/2011  . S/P laparoscopic cholecystectomy 10/16/2011    Rico Junker, PT, DPT 12/12/18    4:56 PM    [facility] [address] [place], [place], [address] Phone: [phone]   Fax:  [phone]  Name: ICESIS RENN MRN: [id number] Date of Birth: [birth year]

## 2018-12-15 ENCOUNTER — Other Ambulatory Visit: Payer: Self-pay | Admitting: Family Medicine

## 2018-12-15 ENCOUNTER — Encounter: Payer: Self-pay | Admitting: Physical Therapy

## 2018-12-15 ENCOUNTER — Ambulatory Visit: Payer: BLUE CROSS/BLUE SHIELD | Admitting: Physical Therapy

## 2018-12-15 DIAGNOSIS — R2689 Other abnormalities of gait and mobility: Secondary | ICD-10-CM

## 2018-12-15 DIAGNOSIS — I10 Essential (primary) hypertension: Secondary | ICD-10-CM

## 2018-12-15 DIAGNOSIS — M79651 Pain in right thigh: Secondary | ICD-10-CM

## 2018-12-15 DIAGNOSIS — R260 Ataxic gait: Secondary | ICD-10-CM

## 2018-12-15 DIAGNOSIS — R2681 Unsteadiness on feet: Secondary | ICD-10-CM

## 2018-12-15 DIAGNOSIS — R278 Other lack of coordination: Secondary | ICD-10-CM

## 2018-12-15 NOTE — Therapy (Signed)
[facility] [address] [place], [place], [address] Phone: [phone]   Fax:  [phone]  Physical Therapy Treatment  Patient Details  Name: [name] Barnett MRN: [id number] Date of Birth: 07-16-1976 Referring Provider (PT): Edmonia Lynch, MD   Encounter Date: 12/15/2018  PT End of Session - 12/15/18 2052    Visit Number  22    Number of Visits  31    Date for PT Re-Evaluation  01/09/19    Authorization Type  [employer]    PT Start Time  1530    PT Stop Time  1618    PT Time Calculation (min)  48 min    Activity Tolerance  Patient limited by pain    Behavior During Therapy  [facility] for tasks assessed/performed       Past Medical History:  Diagnosis Date  . Duodenal obstruction   . GERD (gastroesophageal reflux disease)   . Hypertension   . Stroke [facility])    april 2018, lt sided weakness  . Trimalleolar fracture of ankle, closed, right, initial encounter     Past Surgical History:  Procedure Laterality Date  . BALLOON DILATION  12/31/2011   Procedure: BALLOON DILATION;  Surgeon: Missy Sabins, MD;  Location: [facility] ENDOSCOPY;  Service: Endoscopy;  Laterality: N/A;  . CHOLECYSTECTOMY  07/28/11  . ESOPHAGOGASTRODUODENOSCOPY  10/17/2011   Procedure: ESOPHAGOGASTRODUODENOSCOPY (EGD);  Surgeon: Missy Sabins, MD;  Location: [facility] ENDOSCOPY;  Service: Endoscopy;  Laterality: N/A;  . IR ANGIO INTRA EXTRACRAN SEL INTERNAL CAROTID BILAT MOD SED  04/07/2017  . IR ANGIO INTRA EXTRACRAN SEL INTERNAL CAROTID BILAT MOD SED  01/07/2018  . IR ANGIO VERTEBRAL SEL VERTEBRAL UNI R MOD SED  04/07/2017  . IR ANGIO VERTEBRAL SEL VERTEBRAL UNI R MOD SED  01/07/2018  . IR ANGIOGRAM FOLLOW UP STUDY  04/07/2017  . IR ANGIOGRAM FOLLOW UP STUDY  04/07/2017  . IR ANGIOGRAM FOLLOW UP STUDY  04/07/2017  . IR ANGIOGRAM FOLLOW UP STUDY  04/07/2017  . IR ANGIOGRAM FOLLOW UP STUDY  04/07/2017  . IR ANGIOGRAM SELECTIVE EACH ADDITIONAL VESSEL  04/07/2017  . IR  TRANSCATH/EMBOLIZ  04/07/2017  . IR US GUIDE VASC ACCESS RIGHT  01/07/2018  . ORIF ANKLE FRACTURE Right 07/01/2018   Procedure: RIGHT ANKLE FRACTURE OPEN TREATMENT TRIMALLEOLAR INCLUDES INTERNAL FIXATION WITHOUT FIXATION OF POSTERIOR LIP, FRACTURE CLOSED TREATMENT TRIMALLEOLAR ANKLE WITH MANIPULATION, REPAIR SYNDESMOSIS, REPAIR DELTOID LIGAMENT;  Surgeon: Renette Butters, MD;  Location: [facility];  Service: Orthopedics;  Laterality: Right;  . PARS PLANA VITRECTOMY Right 05/01/2017   Procedure: PARS PLANA VITRECTOMY WITH 25 GAUGE RIGHT EYE, endolaser photocoaglation;  Surgeon: Jalene Mullet, MD;  Location: [facility];  Service: Ophthalmology;  Laterality: Right;  . RADIOLOGY WITH ANESTHESIA N/A 04/07/2017   Procedure: RADIOLOGY WITH ANESTHESIA;  Surgeon: Consuella Lose, MD;  Location: [facility];  Service: Radiology;  Laterality: N/A;  . REPLACEMENT TOTAL KNEE BILATERAL  2004  . TEMPOROMANDIBULAR JOINT SURGERY     2 surgeries  . TUMOR REMOVAL    . VAGOTOMY  01/23/2012   Procedure: VAGOTOMY, antrectomy and BII;  Surgeon: Haywood Lasso, MD;  Location: [facility];  Service: General;  Laterality: N/A;  Laparotomy with vagotomy.    There were no vitals filed for this visit.  Subjective Assessment - 12/15/18 2036    Subjective  Did not try lumbar decompression exercise this weekend.  Still having significant pain in R hip/thigh.  Would like to try TDN again.  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  Yes    Pain Score  8     Pain Location  Hip    Pain Orientation  Right    Pain Descriptors / Indicators  Sore    Pain Type  Neuropathic pain    Pain Onset  More than a month ago                        [facility] Adult PT Treatment/Exercise - 12/15/18 2045      Neuro Re-ed    Neuro Re-ed Details   NMR during ambulation around gym first with UE supported on RW and then with UE supported on therapist's shoulders while therapist provided tactile cues and facilitation of core activation and proximal stability during ambulation.  Pt demonstrated improved stability today with more fluid gait sequence, decreased LOB and decreased ataxia.      Lumbar Exercises: Supine   Ab Set  10 reps;2 seconds    AB Set Limitations  supine with LE at 90/90 supported on bench performing upper trunk lift/crunch forwards and then diagonal across midline for oblique strengthening; cued to perform on exhalation.      Pelvic Tilt  10 seconds;5 reps    Pelvic Tilt Limitations  supine with LE at 90/90 supported on bench    Bent Knee Raise  5 reps;2 seconds    Bent Knee Raise Limitations  with bilat LE at 90/90, alternating bringing each LE off of bench; also performed 5 reps of bringing bilat LE off of bench at same time with core activation and exhalation    Dead Bug  5 reps;3 seconds    Dead Bug Limitations  holding LE at 90/90 not supported by bench, pt performed 5 reps upper trunk lift/crunch       Trigger Point Dry Needling - 12/15/18 2044    Consent Given?  Yes    Muscles Treated Lower Body  Tensor fascia lata;Quadriceps    Tensor Fascia Lata Response  Twitch response elicited    Quadriceps Response  Twitch response elicited             PT Short Term Goals - 12/08/18 1539      PT SHORT TERM GOAL #1   Title  Pt will report ongoing compliance with HEP and fall prevention recommendations    Baseline  12/08/18: reports she has not been doing the HEP.    Status  Not Met      PT SHORT TERM GOAL #2   Title  Pt will improve gait speed to >/= 2.8 ft/esc indicative of community ambulation with LRAD     Baseline  12/08/18: pt's gait speed decreased to 2.39 ft/sec with RW, was  2.73 ft/sec when last checked with RW    Time  --    Period  --    Status  Not Met      PT SHORT TERM GOAL #3   Title  Pt will improve Berg score to >/= 42/56 indicating decreased fall risk potential     Baseline  12/08/18: 45/56 scored today    Time  --    Period  --  Status  Achieved      PT SHORT TERM GOAL #4   Title  Pt will increased TUG score to >/= 15 seconds indicating decreased risk for falls and improvement in functional mobility using LRAD    Baseline  12/08/18: 20.35 sec's with RW, improved just not to goal    Time  --    Period  --    Status  Partially Met      PT SHORT TERM GOAL #5   Title  Pt will report </= 5/10 pain in R hip during exercises, WB and gait    Baseline  12/08/18: 8/10 before and after session    Time  --    Period  --    Status  Not Met      PT SHORT TERM GOAL #6   Title  Patient will perform 12 steps with alternating stepping pattern using B HR's to improve community accessibility with close supervision    Baseline  12/08/18: met today    Time  --    Period  --    Status  Achieved        PT Long Term Goals - 12/12/18 1653      PT LONG TERM GOAL #1   Title  Pt will demonstrate independence with land and aquatic HEP    Target Date  01/09/19      PT LONG TERM GOAL #2   Title  Pt will improve Berg score to >/= 46/56 to decrease fall risk potential     Baseline  39/56 11/10/2018    Target Date  01/09/19      PT LONG TERM GOAL #3   Title  Pt will improve gait speed to >/=3.0 ft/sec indicating significant change in functional mobility with LRAD     Baseline  Pt 2.73 ft/sec with RW 11/10/2018, improved just not to goal level    Target Date  01/09/19      PT LONG TERM GOAL #4   Title  Pt will improve TUG score to </= 13.5 seconds using LRAD indicating decreased fall risk potential     Baseline  Pt performed with RW in 20.6 sec, improved just not to goal      PT LONG TERM GOAL #5   Title  Pt will negotiate 12 steps using B HR's and  alternating stepping technique with supervision to improve community accessibility     Baseline  met 11-17-18    Status  Achieved      PT LONG TERM GOAL #6   Title  Pt will report ability to perform household ambulation using her RW with modified independence demonstrating significant improvement in functional mobility     Baseline  Reports intermittent use of rolling chair; and use of RW dpending on the day.    Target Date  01/09/19      PT LONG TERM GOAL #7   Title  Pt will ambulate outdoors over paved surfaces and perform curbs and inclines with supervision using LRAD for 300 feet to improve functional mobility with community distances     Target Date  01/09/19      PT LONG TERM GOAL #8   Title  --    Target Date  --            Plan - 12/15/18 2052    Clinical Impression Statement  Treatment session returned to use of TDN to TFL and quadriceps muscles to treat active trigger points and decrease pain.  Twitch  response elicited in both muscle groups with improvement in pain following TDN.  Continued with core activation and proximal stability training in supported supine and then during ambulation.  Pt demonstrating significant improvements in coordination and postural control.  Will continue to progress towards LTG.    Rehab Potential  Good    Clinical Impairments Affecting Rehab Potential  high risk for falls due to abnormal gait, ataxia, sensory changes, and impaired cognition.     PT Frequency  2x / week    PT Duration  8 weeks    PT Treatment/Interventions  ADLs/Self Care Home Management;Aquatic Therapy;DME Instruction;Gait training;Stair training;Functional mobility training;Therapeutic activities;Therapeutic exercise;Balance training;Neuromuscular re-education;Patient/family education;Cognitive remediation;Orthotic Fit/Training;Electrical Stimulation;Cryotherapy;Moist Heat;Manual techniques;Passive range of motion;Taping;Energy conservation;Dry needling    PT Next Visit Plan   TDN to lumbar multifidus/quad/TFL/adductor/hip flexor; proximal stability and core activation in sidelying/supine (pilates?), tall kneeling and standing, focus on decreased use of lumbar extensors during gait, cont to work on balance, gait and trunk stabilization activities     PT Home Exercise Plan  Access Code: [email or web address]     Consulted and Agree with Plan of Care  Patient;Family member/caregiver    Family Member Consulted  Spouse       Patient will benefit from skilled therapeutic intervention in order to improve the following deficits and impairments:  Abnormal gait, Decreased balance, Decreased cognition, Decreased coordination, Decreased strength, Difficulty walking, Impaired sensation, Pain, Decreased activity tolerance, Decreased mobility, Decreased endurance, Postural dysfunction, Decreased knowledge of use of DME, Impaired perceived functional ability, Decreased range of motion, Impaired flexibility  Visit Diagnosis: Ataxic gait  Other lack of coordination  Other abnormalities of gait and mobility  Unsteadiness on feet  Pain in right thigh     Problem List Patient Active Problem List   Diagnosis Date Noted  . Trimalleolar fracture of ankle, closed, right, initial encounter 11/27/2018  . Tobacco use disorder 11/27/2018  . Osteoarthritis of right hip 10/07/2018  . 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 Weakness with dizziness-  since [facility] and CVA 04/07/17 08/07/2017  . Disturbances of vision, late effect of stroke 08/06/2017  . Health education/counseling 08/05/2017  . High risk medications (not anticoagulants) long-term use 08/05/2017  . Neuritis-  R sided:  arm and leg/ body due to stroke 08/05/2017  . Elevated LDL cholesterol level 07/11/2017  . [employer] (NIH) Stroke Scale limb ataxia score 2, ataxia present in two limbs 06/25/2017  . 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 Elevated vitamin B12 level 05/30/2017  . Vitamin D  deficiency 05/29/2017  . History of tobacco abuse-  30pk yr hx - quit 04/07/17 05/21/2017  . Gait disturbance, post-stroke 05/14/2017  . Benign essential HTN   . Vitreous hemorrhage of right eye ([place])   . Adjustment disorder with mixed anxiety and depressed mood   . 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 s/p SAH (subarachnoid hemorrhage) ([place]) 04/19/2017  . 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 Hypoxia   . Subarachnoid hemorrhage due to ruptured aneurysm ([place]) 04/07/2017  . CVA (cerebral vascular accident) ([place]) 04/07/2017  . Elevated gastrin level 01/25/2012  . Hypokalemia 01/21/2012  . Nausea & vomiting 01/20/2012  . Epigastric pain 01/20/2012  . Duodenal ulcer, acute with obstruction 10/17/2011  . S/P laparoscopic cholecystectomy 10/16/2011    Rico Junker, PT, DPT 12/15/18    9:03 PM    [facility] [phone]  [place], [place], [address] Phone: [phone]   Fax:  [phone]  Name: LAKELY ELMENDORF MRN: [id number] Date of Birth: 1976-01-08

## 2018-12-17 ENCOUNTER — Encounter: Payer: Self-pay | Admitting: Occupational Therapy

## 2018-12-17 ENCOUNTER — Encounter: Payer: BLUE CROSS/BLUE SHIELD | Admitting: Occupational Therapy

## 2018-12-17 DIAGNOSIS — R270 Ataxia, unspecified: Secondary | ICD-10-CM

## 2018-12-17 NOTE — Therapy (Signed)
[address] [facility] [place], [place], [address] Phone: [phone]   Fax:  [phone]  Occupational Therapy Treatment  Patient Details  Name: [name] Barnett MRN: [id number] Date of Birth: 1976-06-03 Referring Provider (OT): Dr. Alysia Penna   Encounter Date: 12/17/2018    Past Medical History:  Diagnosis Date  . Duodenal obstruction   . GERD (gastroesophageal reflux disease)   . Hypertension   . Stroke [facility])    april 2018, lt sided weakness  . Trimalleolar fracture of ankle, closed, right, initial encounter     Past Surgical History:  Procedure Laterality Date  . BALLOON DILATION  12/31/2011   Procedure: BALLOON DILATION;  Surgeon: Missy Sabins, MD;  Location: [facility] ENDOSCOPY;  Service: Endoscopy;  Laterality: N/A;  . CHOLECYSTECTOMY  07/28/11  . ESOPHAGOGASTRODUODENOSCOPY  10/17/2011   Procedure: ESOPHAGOGASTRODUODENOSCOPY (EGD);  Surgeon: Missy Sabins, MD;  Location: [facility] ENDOSCOPY;  Service: Endoscopy;  Laterality: N/A;  . IR ANGIO INTRA EXTRACRAN SEL INTERNAL CAROTID BILAT MOD SED  04/07/2017  . IR ANGIO INTRA EXTRACRAN SEL INTERNAL CAROTID BILAT MOD SED  01/07/2018  . IR ANGIO VERTEBRAL SEL VERTEBRAL UNI R MOD SED  04/07/2017  . IR ANGIO VERTEBRAL SEL VERTEBRAL UNI R MOD SED  01/07/2018  . IR ANGIOGRAM FOLLOW UP STUDY  04/07/2017  . IR ANGIOGRAM FOLLOW UP STUDY  04/07/2017  . IR ANGIOGRAM FOLLOW UP STUDY  04/07/2017  . IR ANGIOGRAM FOLLOW UP STUDY  04/07/2017  . IR ANGIOGRAM FOLLOW UP STUDY  04/07/2017  . IR ANGIOGRAM SELECTIVE EACH ADDITIONAL VESSEL  04/07/2017  . IR TRANSCATH/EMBOLIZ  04/07/2017  . IR US GUIDE VASC ACCESS RIGHT  01/07/2018  . ORIF ANKLE FRACTURE Right 07/01/2018   Procedure: RIGHT ANKLE FRACTURE OPEN TREATMENT TRIMALLEOLAR INCLUDES INTERNAL FIXATION WITHOUT FIXATION OF POSTERIOR LIP, FRACTURE CLOSED TREATMENT TRIMALLEOLAR ANKLE WITH MANIPULATION, REPAIR SYNDESMOSIS, REPAIR DELTOID LIGAMENT;  Surgeon:  Renette Butters, MD;  Location: [facility];  Service: Orthopedics;  Laterality: Right;  . PARS PLANA VITRECTOMY Right 05/01/2017   Procedure: PARS PLANA VITRECTOMY WITH 25 GAUGE RIGHT EYE, endolaser photocoaglation;  Surgeon: Jalene Mullet, MD;  Location: [facility];  Service: Ophthalmology;  Laterality: Right;  . RADIOLOGY WITH ANESTHESIA N/A 04/07/2017   Procedure: RADIOLOGY WITH ANESTHESIA;  Surgeon: Consuella Lose, MD;  Location: [facility];  Service: Radiology;  Laterality: N/A;  . REPLACEMENT TOTAL KNEE BILATERAL  2004  . TEMPOROMANDIBULAR JOINT SURGERY     2 surgeries  . TUMOR REMOVAL    . VAGOTOMY  01/23/2012   Procedure: VAGOTOMY, antrectomy and BII;  Surgeon: Haywood Lasso, MD;  Location: [facility];  Service: General;  Laterality: N/A;  Laparotomy with vagotomy.    There were no vitals filed for this visit.                          OT Short Term Goals - 11/19/18 2135      OT SHORT TERM GOAL #1   Title  Pt will verbalize understanding of basic water principles in prep for aquatic HEP - 12/10/2018    Status  New      OT SHORT TERM GOAL #7   Status  --   inconsistent.  01/09/18 met at approx this level       OT Long Term Goals - 11/19/18 2135      OT LONG TERM GOAL #1   Title  Pt and family willl  be mod I with aquatic HEP (for use when pt has access to pool) - 12/11/27/2020 (date adjusted as pt is just now returning after evaluation for first treatment session)    Status  On-going      OT LONG TERM GOAL #2   Title  Pt will demonstrate improved postural alignment and control in standing as demonstrated by ability to do light home mgmt tasks in standing (such as fold laundry)    Status  On-going      OT LONG TERM GOAL #3   Title  Pt will report greater ease in using LUE for simple home mgmt tasks (such as unloading dishwasher into [employer]) .     Status  On-going      OT LONG TERM GOAL #4   Title  Pt will be mod I using RW during  simple home mgmt tasks at ambulatory level.      Status  On-going            Plan - 12/17/18 1300    Clinical Impression Statement  Pt being seen for aquatic therapy. Due to scheduling conflict, pt to be placed on hold until further notice.       Patient will benefit from skilled therapeutic intervention in order to improve the following deficits and impairments:     Visit Diagnosis: Ataxia    Problem List Patient Active Problem List   Diagnosis Date Noted  . Trimalleolar fracture of ankle, closed, right, initial encounter 11/27/2018  . Tobacco use disorder 11/27/2018  . Osteoarthritis of right hip 10/07/2018  . 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 Weakness with dizziness-  since [facility] and CVA 04/07/17 08/07/2017  . Disturbances of vision, late effect of stroke 08/06/2017  . Health education/counseling 08/05/2017  . High risk medications (not anticoagulants) long-term use 08/05/2017  . Neuritis-  R sided:  arm and leg/ body due to stroke 08/05/2017  . Elevated LDL cholesterol level 07/11/2017  . [employer] (NIH) Stroke Scale limb ataxia score 2, ataxia present in two limbs 06/25/2017  . 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 Elevated vitamin B12 level 05/30/2017  . Vitamin D deficiency 05/29/2017  . History of tobacco abuse-  30pk yr hx - quit 04/07/17 05/21/2017  . Gait disturbance, post-stroke 05/14/2017  . Benign essential HTN   . Vitreous hemorrhage of right eye ([place])   . Adjustment disorder with mixed anxiety and depressed mood   . 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 s/p SAH (subarachnoid hemorrhage) ([place]) 04/19/2017  . 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 Hypoxia   . Subarachnoid hemorrhage due to ruptured aneurysm ([place]) 04/07/2017  . CVA (cerebral vascular accident) ([place]) 04/07/2017  . Elevated gastrin level 01/25/2012  . Hypokalemia 01/21/2012  . Nausea  & vomiting 01/20/2012  . Epigastric pain 01/20/2012  . Duodenal ulcer, acute with obstruction 10/17/2011  . S/P laparoscopic cholecystectomy 10/16/2011    Quay Burow, OTR/L 12/17/2018, 1:00 PM  [facility] [address] [place], [place], [address] Phone: [phone]   Fax:  [phone]  Name: KENADY DOXTATER MRN: [id number] Date of Birth: [birth year]

## 2018-12-19 ENCOUNTER — Ambulatory Visit: Payer: BLUE CROSS/BLUE SHIELD

## 2018-12-19 DIAGNOSIS — R278 Other lack of coordination: Secondary | ICD-10-CM

## 2018-12-19 DIAGNOSIS — R2689 Other abnormalities of gait and mobility: Secondary | ICD-10-CM

## 2018-12-19 DIAGNOSIS — M79651 Pain in right thigh: Secondary | ICD-10-CM

## 2018-12-19 DIAGNOSIS — R260 Ataxic gait: Secondary | ICD-10-CM | POA: Diagnosis not present

## 2018-12-19 DIAGNOSIS — R2681 Unsteadiness on feet: Secondary | ICD-10-CM

## 2018-12-19 NOTE — Therapy (Signed)
[facility] [address] [place], [place], [address] Phone: [phone]   Fax:  [phone]  Physical Therapy Treatment  Patient Details  Name: [name] Barnett MRN: [id number] Date of Birth: [birth year] Referring Provider (PT): Edmonia Lynch, MD   Encounter Date: 12/19/2018  PT End of Session - 12/19/18 1614    Visit Number  23    Number of Visits  31    Date for PT Re-Evaluation  01/09/19    Authorization Type  [employer]    PT Start Time  1530    PT Stop Time  1615    PT Time Calculation (min)  45 min    Equipment Utilized During Treatment  Gait belt    Activity Tolerance  Patient limited by pain    Behavior During Therapy  [facility] for tasks assessed/performed       Past Medical History:  Diagnosis Date  . Duodenal obstruction   . GERD (gastroesophageal reflux disease)   . Hypertension   . Stroke [facility])    april 2018, lt sided weakness  . Trimalleolar fracture of ankle, closed, right, initial encounter     Past Surgical History:  Procedure Laterality Date  . BALLOON DILATION  12/31/2011   Procedure: BALLOON DILATION;  Surgeon: Missy Sabins, MD;  Location: [facility] ENDOSCOPY;  Service: Endoscopy;  Laterality: N/A;  . CHOLECYSTECTOMY  07/28/11  . ESOPHAGOGASTRODUODENOSCOPY  10/17/2011   Procedure: ESOPHAGOGASTRODUODENOSCOPY (EGD);  Surgeon: Missy Sabins, MD;  Location: [facility] ENDOSCOPY;  Service: Endoscopy;  Laterality: N/A;  . IR ANGIO INTRA EXTRACRAN SEL INTERNAL CAROTID BILAT MOD SED  04/07/2017  . IR ANGIO INTRA EXTRACRAN SEL INTERNAL CAROTID BILAT MOD SED  01/07/2018  . IR ANGIO VERTEBRAL SEL VERTEBRAL UNI R MOD SED  04/07/2017  . IR ANGIO VERTEBRAL SEL VERTEBRAL UNI R MOD SED  01/07/2018  . IR ANGIOGRAM FOLLOW UP STUDY  04/07/2017  . IR ANGIOGRAM FOLLOW UP STUDY  04/07/2017  . IR ANGIOGRAM FOLLOW UP STUDY  04/07/2017  . IR ANGIOGRAM FOLLOW UP STUDY  04/07/2017  . IR ANGIOGRAM FOLLOW UP STUDY  04/07/2017  . IR ANGIOGRAM SELECTIVE  EACH ADDITIONAL VESSEL  04/07/2017  . IR TRANSCATH/EMBOLIZ  04/07/2017  . IR US GUIDE VASC ACCESS RIGHT  01/07/2018  . ORIF ANKLE FRACTURE Right 07/01/2018   Procedure: RIGHT ANKLE FRACTURE OPEN TREATMENT TRIMALLEOLAR INCLUDES INTERNAL FIXATION WITHOUT FIXATION OF POSTERIOR LIP, FRACTURE CLOSED TREATMENT TRIMALLEOLAR ANKLE WITH MANIPULATION, REPAIR SYNDESMOSIS, REPAIR DELTOID LIGAMENT;  Surgeon: Renette Butters, MD;  Location: [place];  Service: Orthopedics;  Laterality: Right;  . PARS PLANA VITRECTOMY Right 05/01/2017   Procedure: PARS PLANA VITRECTOMY WITH 25 GAUGE RIGHT EYE, endolaser photocoaglation;  Surgeon: Jalene Mullet, MD;  Location: [place];  Service: Ophthalmology;  Laterality: Right;  . RADIOLOGY WITH ANESTHESIA N/A 04/07/2017   Procedure: RADIOLOGY WITH ANESTHESIA;  Surgeon: Consuella Lose, MD;  Location: [place];  Service: Radiology;  Laterality: N/A;  . REPLACEMENT TOTAL KNEE BILATERAL  2004  . TEMPOROMANDIBULAR JOINT SURGERY     2 surgeries  . TUMOR REMOVAL    . VAGOTOMY  01/23/2012   Procedure: VAGOTOMY, antrectomy and BII;  Surgeon: Haywood Lasso, MD;  Location: [place];  Service: General;  Laterality: N/A;  Laparotomy with vagotomy.    There were no vitals filed for this visit.  Subjective Assessment - 12/19/18 1535    Subjective  No falls to report, uses treadmill at home however does not perform HEP  at home provided by PT.   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  Yes  (Pended)     Pain Score  10-Worst pain ever  (Pended)     Pain Location  Pelvis  (Pended)     Pain Orientation  Right  (Pended)     Pain Descriptors /  Indicators  Sore  (Pended)     Pain Type  Neuropathic pain  (Pended)     Pain Onset  More than a month ago  (Pended)     Pain Frequency  Constant  (Pended)         [facility] Adult PT Treatment/Exercise - 12/19/18 1619      Ambulation/Gait   Ambulation/Gait  Yes    Ambulation/Gait Assistance  4: Min guard    Ambulation/Gait Assistance Details  Engaged pt in gait training with rollator initially, pt presented with mod instability due to inibility to control front wheels. Pt ambulated 226f with posterior walker to facilitate shoulder depression and stability with VC's to decrease BUE reliance and relax shoulders.     Ambulation Distance (Feet)  230 Feet   115   Assistive device  Rollator;Other (Comment)   Posterior walker   Gait Pattern  Step-through pattern;Ataxic    Ambulation Surface  Level;Indoor         PT Education - 12/19/18 1633    Education Details  Safe use of rollator prior to using/during gait training.     Person(s) Educated  Patient    Methods  Demonstration;Explanation;Verbal cues;Tactile cues    Comprehension  Verbalized understanding;Returned demonstration;Verbal cues required;Tactile cues required;Need further instruction       PT Short Term Goals - 12/08/18 1539      PT SHORT TERM GOAL #1   Title  Pt will report ongoing compliance with HEP and fall prevention recommendations    Baseline  12/08/18: reports she has not been doing the HEP.    Status  Not Met      PT SHORT TERM GOAL #2   Title  Pt will improve gait speed to >/= 2.8 ft/esc indicative of community ambulation with LRAD     Baseline  12/08/18: pt's gait speed decreased to 2.39 ft/sec with RW, was 2.73 ft/sec when last checked with RW    Time  --    Period  --    Status  Not Met      PT SHORT TERM GOAL #3   Title  Pt will improve Berg score to >/= 42/56 indicating decreased fall risk potential     Baseline  12/08/18: 45/56 scored today    Time  --    Period  --    Status  Achieved      PT  SHORT TERM GOAL #4   Title  Pt will increased TUG score to >/= 15 seconds indicating decreased risk for falls and improvement in functional mobility using LRAD    Baseline  12/08/18: 20.35 sec's with RW, improved just not to goal    Time  --    Period  --    Status  Partially  Met      PT SHORT TERM GOAL #5   Title  Pt will report </= 5/10 pain in R hip during exercises, WB and gait    Baseline  12/08/18: 8/10 before and after session    Time  --    Period  --    Status  Not Met      PT SHORT TERM GOAL #6   Title  Patient will perform 12 steps with alternating stepping pattern using B HR's to improve community accessibility with close supervision    Baseline  12/08/18: met today    Time  --    Period  --    Status  Achieved        PT Long Term Goals - 12/12/18 1653      PT LONG TERM GOAL #1   Title  Pt will demonstrate independence with land and aquatic HEP    Target Date  01/09/19      PT LONG TERM GOAL #2   Title  Pt will improve Berg score to >/= 46/56 to decrease fall risk potential     Baseline  39/56 11/10/2018    Target Date  01/09/19      PT LONG TERM GOAL #3   Title  Pt will improve gait speed to >/=3.0 ft/sec indicating significant change in functional mobility with LRAD     Baseline  Pt 2.73 ft/sec with RW 11/10/2018, improved just not to goal level    Target Date  01/09/19      PT LONG TERM GOAL #4   Title  Pt will improve TUG score to </= 13.5 seconds using LRAD indicating decreased fall risk potential     Baseline  Pt performed with RW in 20.6 sec, improved just not to goal      PT LONG TERM GOAL #5   Title  Pt will negotiate 12 steps using B HR's and alternating stepping technique with supervision to improve community accessibility     Baseline  met 11-17-18    Status  Achieved      PT LONG TERM GOAL #6   Title  Pt will report ability to perform household ambulation using her RW with modified independence demonstrating significant improvement in functional  mobility     Baseline  Reports intermittent use of rolling chair; and use of RW dpending on the day.    Target Date  01/09/19      PT LONG TERM GOAL #7   Title  Pt will ambulate outdoors over paved surfaces and perform curbs and inclines with supervision using LRAD for 300 feet to improve functional mobility with community distances     Target Date  01/09/19      PT LONG TERM GOAL #8   Title  --    Target Date  --            Plan - 12/19/18 1635    Clinical Impression Statement  Todays skilled session focused on gait training with Rollator initially with pt being deemed unsafe with rollator, pt then ambulated with posterior walker with cues for shoulder depression to decrease intensity in shoulders during gait/activity. Pt educated on proper use of rollator this session. Pt should benefit from continued PT sessions to progress towards goals.     Rehab Potential  Good    Clinical Impairments Affecting Rehab Potential  high risk for falls due to abnormal gait, ataxia, sensory changes, and impaired cognition.     PT Frequency  2x / week    PT Duration  8 weeks    PT Treatment/Interventions  ADLs/Self Care Home Management;Aquatic Therapy;DME Instruction;Gait training;Stair training;Functional mobility training;Therapeutic activities;Therapeutic exercise;Balance training;Neuromuscular re-education;Patient/family education;Cognitive remediation;Orthotic Fit/Training;Electrical Stimulation;Cryotherapy;Moist Heat;Manual techniques;Passive range of motion;Taping;Energy conservation;Dry needling    PT Next Visit Plan  Convert RW to posterior walker, TDN to lumbar multifidus/quad/TFL/adductor/hip flexor; proximal stability and core activation in sidelying/supine (pilates?), tall kneeling and standing, focus on decreased use of lumbar extensors during gait, cont to work on balance, gait and trunk stabilization activities     PT Home Exercise Plan  Access Code: [email or web address]     Consulted and Agree with  Plan of Care  Patient;Family member/caregiver    Family Member Consulted  Spouse       Patient will benefit from skilled therapeutic intervention in order to improve the following deficits and impairments:  Abnormal gait, Decreased balance, Decreased cognition, Decreased coordination, Decreased strength, Difficulty walking, Impaired sensation, Pain, Decreased activity tolerance, Decreased mobility, Decreased endurance, Postural dysfunction, Decreased knowledge of use of DME, Impaired perceived functional ability, Decreased range of motion, Impaired flexibility  Visit Diagnosis: Ataxic gait  Other abnormalities of gait and mobility  Other lack of coordination  Unsteadiness on feet  Pain in right thigh     Problem List Patient Active Problem List   Diagnosis Date Noted  . Trimalleolar fracture of ankle, closed, right, initial encounter 11/27/2018  . Tobacco use disorder 11/27/2018  . Osteoarthritis of right hip 10/07/2018  . 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 Weakness with dizziness-  since [facility] and CVA 04/07/17 08/07/2017  . Disturbances of vision, late effect of stroke 08/06/2017  . Health education/counseling 08/05/2017  . High risk medications (not anticoagulants) long-term use 08/05/2017  . Neuritis-  R sided:  arm and leg/ body due to stroke 08/05/2017  . Elevated LDL cholesterol level 07/11/2017  . [employer] (NIH) Stroke Scale limb ataxia score 2, ataxia present in two limbs 06/25/2017  . 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 Elevated vitamin B12 level 05/30/2017  . Vitamin D deficiency 05/29/2017  . History of tobacco abuse-  30pk yr hx - quit 04/07/17 05/21/2017  . Gait disturbance, post-stroke 05/14/2017  . Benign essential HTN   . Vitreous hemorrhage of right eye ([place])   . Adjustment disorder with mixed anxiety and depressed mood   . 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 s/p SAH (subarachnoid hemorrhage) ([place]) 04/19/2017  . 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 Hypoxia   . Subarachnoid hemorrhage due to ruptured aneurysm ([place]) 04/07/2017  . CVA (cerebral vascular accident) ([place]) 04/07/2017  . Elevated gastrin level 01/25/2012  . Hypokalemia 01/21/2012  . Nausea & vomiting 01/20/2012  . Epigastric pain 01/20/2012  . Duodenal ulcer, acute with obstruction 10/17/2011  . S/P laparoscopic cholecystectomy 10/16/2011    , PTA   A  12/19/2018, 4:42 PM  [facility] [address] [place], [place], [address] Phone: [phone]   Fax:  [phone]  Name: [name] Barnett MRN: [id number] Date of Birth: 11-Mar-1976

## 2018-12-22 ENCOUNTER — Ambulatory Visit: Payer: BLUE CROSS/BLUE SHIELD | Admitting: Physical Therapy

## 2018-12-29 ENCOUNTER — Encounter: Payer: Self-pay | Admitting: Physical Therapy

## 2018-12-29 ENCOUNTER — Ambulatory Visit: Payer: BLUE CROSS/BLUE SHIELD | Admitting: Physical Therapy

## 2018-12-29 DIAGNOSIS — R278 Other lack of coordination: Secondary | ICD-10-CM

## 2018-12-29 DIAGNOSIS — R260 Ataxic gait: Secondary | ICD-10-CM | POA: Diagnosis not present

## 2018-12-29 DIAGNOSIS — R2689 Other abnormalities of gait and mobility: Secondary | ICD-10-CM

## 2018-12-29 DIAGNOSIS — R2681 Unsteadiness on feet: Secondary | ICD-10-CM

## 2018-12-30 NOTE — Therapy (Signed)
[facility] [address] [place], [place], [address] Phone: [phone]   Fax:  [phone]  Physical Therapy Treatment  Patient Details  Name: [name] Barnett MRN: [id number] Date of Birth: Apr 20, 1976 Referring Provider (PT): Edmonia Lynch, MD   Encounter Date: 12/29/2018  PT End of Session - 12/30/18 1500    Visit Number  24    Number of Visits  31    Date for PT Re-Evaluation  01/09/19    Authorization Type  [employer]    PT Start Time  2836    PT Stop Time  1618    PT Time Calculation (min)  43 min    Activity Tolerance  Patient tolerated treatment well    Behavior During Therapy  [facility] for tasks assessed/performed       Past Medical History:  Diagnosis Date  . Duodenal obstruction   . GERD (gastroesophageal reflux disease)   . Hypertension   . Stroke [facility])    april 2018, lt sided weakness  . Trimalleolar fracture of ankle, closed, right, initial encounter     Past Surgical History:  Procedure Laterality Date  . BALLOON DILATION  12/31/2011   Procedure: BALLOON DILATION;  Surgeon: Missy Sabins, MD;  Location: [facility] ENDOSCOPY;  Service: Endoscopy;  Laterality: N/A;  . CHOLECYSTECTOMY  07/28/11  . ESOPHAGOGASTRODUODENOSCOPY  10/17/2011   Procedure: ESOPHAGOGASTRODUODENOSCOPY (EGD);  Surgeon: Missy Sabins, MD;  Location: [facility] ENDOSCOPY;  Service: Endoscopy;  Laterality: N/A;  . IR ANGIO INTRA EXTRACRAN SEL INTERNAL CAROTID BILAT MOD SED  04/07/2017  . IR ANGIO INTRA EXTRACRAN SEL INTERNAL CAROTID BILAT MOD SED  01/07/2018  . IR ANGIO VERTEBRAL SEL VERTEBRAL UNI R MOD SED  04/07/2017  . IR ANGIO VERTEBRAL SEL VERTEBRAL UNI R MOD SED  01/07/2018  . IR ANGIOGRAM FOLLOW UP STUDY  04/07/2017  . IR ANGIOGRAM FOLLOW UP STUDY  04/07/2017  . IR ANGIOGRAM FOLLOW UP STUDY  04/07/2017  . IR ANGIOGRAM FOLLOW UP STUDY  04/07/2017  . IR ANGIOGRAM FOLLOW UP STUDY  04/07/2017  . IR ANGIOGRAM SELECTIVE EACH ADDITIONAL VESSEL  04/07/2017  . IR  TRANSCATH/EMBOLIZ  04/07/2017  . IR US GUIDE VASC ACCESS RIGHT  01/07/2018  . ORIF ANKLE FRACTURE Right 07/01/2018   Procedure: RIGHT ANKLE FRACTURE OPEN TREATMENT TRIMALLEOLAR INCLUDES INTERNAL FIXATION WITHOUT FIXATION OF POSTERIOR LIP, FRACTURE CLOSED TREATMENT TRIMALLEOLAR ANKLE WITH MANIPULATION, REPAIR SYNDESMOSIS, REPAIR DELTOID LIGAMENT;  Surgeon: Renette Butters, MD;  Location: [place];  Service: Orthopedics;  Laterality: Right;  . PARS PLANA VITRECTOMY Right 05/01/2017   Procedure: PARS PLANA VITRECTOMY WITH 25 GAUGE RIGHT EYE, endolaser photocoaglation;  Surgeon: Jalene Mullet, MD;  Location: [place];  Service: Ophthalmology;  Laterality: Right;  . RADIOLOGY WITH ANESTHESIA N/A 04/07/2017   Procedure: RADIOLOGY WITH ANESTHESIA;  Surgeon: Consuella Lose, MD;  Location: [place];  Service: Radiology;  Laterality: N/A;  . REPLACEMENT TOTAL KNEE BILATERAL  2004  . TEMPOROMANDIBULAR JOINT SURGERY     2 surgeries  . TUMOR REMOVAL    . VAGOTOMY  01/23/2012   Procedure: VAGOTOMY, antrectomy and BII;  Surgeon: Haywood Lasso, MD;  Location: [place];  Service: General;  Laterality: N/A;  Laparotomy with vagotomy.    There were no vitals filed for this visit.  Subjective Assessment - 12/29/18 1542    Subjective  Since she did not have therapy last week she used the treadmill every day x 30 minutes; caused increased pain in R wrist  due to carpal tunnel.  Is going to ask for order for rollator from physician.    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  Yes    Pain Onset  More than a month ago                       [facility] Adult PT Treatment/Exercise - 12/30/18 1444      Ambulation/Gait    Ambulation/Gait  Yes    Ambulation/Gait Assistance  5: Supervision;4: Min guard    Ambulation/Gait Assistance Details  continued gait and safety training with four wheeled walker with 20lb weight added to front of 4WW for first lap to increased resistance, stability and decrease amount of swivel in front wheels.  Pt demonstrated increased stability and safety and only required supervision from therapist.  Pt requested to remove weights and immediately noticed greater instability and decreased ability to control movement of rollator.  Therapist provided stabilization at pelvis with little effect on steering of rollator; changed facilitation to ribs/trunk with pt returning to a more stable and controlled gait with 4WW.  Required intermittent cues to maintain upright posture and close distance to 4WW    Ambulation Distance (Feet)  230 Feet    Assistive device  Rollator    Gait Pattern  Step-through pattern;Ataxic;Trunk flexed    Ambulation Surface  Level;Indoor      Lumbar Exercises: Standing   Shoulder Extension  Strengthening;Right;Left;Both;10 reps;Theraband    Theraband Level (Shoulder Extension)  Level 3 (Green)    Shoulder Extension Limitations  Performed in tall kneeling on mat with second person guarding patient as she performed single UE and then bilat UE extension for postural control and core strengthening    Other Standing Lumbar Exercises  Performed in tall kneeling on mat with second person guarding patient as she performed single UE and then bilat UE extension for postural control and core strengthening: right and left UE PNF D2 pattern against resistance of green theraband x 10 reps for trunk rotation and weight shifting    Other Standing Lumbar Exercises  Performed in tall kneeling on mat with second person guarding patient as she performed single UE and then bilat UE extension for postural control and core strengthening: 10 reps to left and right, horizontal trunk rotation against  resistance of green theraband with frequent rest breaks due to fatigue             PT Education - 12/30/18 1500    Education provided  Yes    Education Details  safe use of rollator    Person(s) Educated  Patient;Parent(s)    Methods  Explanation;Demonstration    Comprehension  Verbalized understanding;Returned demonstration;Need further instruction       PT Short Term Goals - 12/08/18 1539      PT SHORT TERM GOAL #1   Title  Pt will report ongoing compliance with HEP and fall prevention recommendations    Baseline  12/08/18: reports she has not been doing the HEP.    Status  Not Met      PT SHORT TERM GOAL #2   Title  Pt will improve gait speed to >/= 2.8 ft/esc indicative of community  ambulation with LRAD     Baseline  12/08/18: pt's gait speed decreased to 2.39 ft/sec with RW, was 2.73 ft/sec when last checked with RW    Time  --    Period  --    Status  Not Met      PT SHORT TERM GOAL #3   Title  Pt will improve Berg score to >/= 42/56 indicating decreased fall risk potential     Baseline  12/08/18: 45/56 scored today    Time  --    Period  --    Status  Achieved      PT SHORT TERM GOAL #4   Title  Pt will increased TUG score to >/= 15 seconds indicating decreased risk for falls and improvement in functional mobility using LRAD    Baseline  12/08/18: 20.35 sec's with RW, improved just not to goal    Time  --    Period  --    Status  Partially Met      PT SHORT TERM GOAL #5   Title  Pt will report </= 5/10 pain in R hip during exercises, WB and gait    Baseline  12/08/18: 8/10 before and after session    Time  --    Period  --    Status  Not Met      PT SHORT TERM GOAL #6   Title  Patient will perform 12 steps with alternating stepping pattern using B HR's to improve community accessibility with close supervision    Baseline  12/08/18: met today    Time  --    Period  --    Status  Achieved        PT Long Term Goals - 12/12/18 1653      PT LONG  TERM GOAL #1   Title  Pt will demonstrate independence with land and aquatic HEP    Target Date  01/09/19      PT LONG TERM GOAL #2   Title  Pt will improve Berg score to >/= 46/56 to decrease fall risk potential     Baseline  39/56 11/10/2018    Target Date  01/09/19      PT LONG TERM GOAL #3   Title  Pt will improve gait speed to >/=3.0 ft/sec indicating significant change in functional mobility with LRAD     Baseline  Pt 2.73 ft/sec with RW 11/10/2018, improved just not to goal level    Target Date  01/09/19      PT LONG TERM GOAL #4   Title  Pt will improve TUG score to </= 13.5 seconds using LRAD indicating decreased fall risk potential     Baseline  Pt performed with RW in 20.6 sec, improved just not to goal      PT LONG TERM GOAL #5   Title  Pt will negotiate 12 steps using B HR's and alternating stepping technique with supervision to improve community accessibility     Baseline  met 11-17-18    Status  Achieved      PT LONG TERM GOAL #6   Title  Pt will report ability to perform household ambulation using her RW with modified independence demonstrating significant improvement in functional mobility     Baseline  Reports intermittent use of rolling chair; and use of RW dpending on the day.    Target Date  01/09/19      PT LONG TERM GOAL #7   Title  Pt will  ambulate outdoors over paved surfaces and perform curbs and inclines with supervision using LRAD for 300 feet to improve functional mobility with community distances     Target Date  01/09/19      PT LONG TERM GOAL #8   Title  --    Target Date  --            Plan - 12/30/18 1501    Clinical Impression Statement  Continued safety and gait training with rollator with main barrier to use is ongoing trunk control and proximal stability impairments causing instability when navigating with 4WW.  Continued postural control training and core training in tall kneeling with resistance of theraband to continue to address  proximal stability impairments.  Pt tolerated well but did require frequent rest breaks due to fatigue.  Will continue to address.    Rehab Potential  Good    Clinical Impairments Affecting Rehab Potential  high risk for falls due to abnormal gait, ataxia, sensory changes, and impaired cognition.     PT Frequency  2x / week    PT Duration  8 weeks    PT Treatment/Interventions  ADLs/Self Care Home Management;Aquatic Therapy;DME Instruction;Gait training;Stair training;Functional mobility training;Therapeutic activities;Therapeutic exercise;Balance training;Neuromuscular re-education;Patient/family education;Cognitive remediation;Orthotic Fit/Training;Electrical Stimulation;Cryotherapy;Moist Heat;Manual techniques;Passive range of motion;Taping;Energy conservation;Dry needling    PT Next Visit Plan  NEEDS TO SCHEDULE MORE VISITS: 2X/WEEK.  Gait training with rollator (front end weighted down with 20lb).  TDN to lumbar multifidus/quad/TFL/adductor/hip flexor; proximal stability and core activation in sidelying/supine (pilates?), tall kneeling (trunk stability during UE movement/resistance with theraband) and standing, focus on decreased use of lumbar extensors during gait, cont to work on balance, gait and trunk stabilization activities     PT Home Exercise Plan  Access Code: [email or web address]     Consulted and Agree with Plan of Care  Patient;Family member/caregiver    Family Member Consulted  Spouse       Patient will benefit from skilled therapeutic intervention in order to improve the following deficits and impairments:  Abnormal gait, Decreased balance, Decreased cognition, Decreased coordination, Decreased strength, Difficulty walking, Impaired sensation, Pain, Decreased activity tolerance, Decreased mobility, Decreased endurance, Postural dysfunction, Decreased knowledge of use of DME, Impaired perceived functional ability, Decreased range of motion, Impaired flexibility  Visit Diagnosis: Ataxic  gait  Other lack of coordination  Other abnormalities of gait and mobility  Unsteadiness on feet     Problem List Patient Active Problem List   Diagnosis Date Noted  . Trimalleolar fracture of ankle, closed, right, initial encounter 11/27/2018  . Tobacco use disorder 11/27/2018  . Osteoarthritis of right hip 10/07/2018  . 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 Weakness with dizziness-  since [facility] and CVA 04/07/17 08/07/2017  . Disturbances of vision, late effect of stroke 08/06/2017  . Health education/counseling 08/05/2017  . High risk medications (not anticoagulants) long-term use 08/05/2017  . Neuritis-  R sided:  arm and leg/ body due to stroke 08/05/2017  . Elevated LDL cholesterol level 07/11/2017  . [employer] (NIH) Stroke Scale limb ataxia score 2, ataxia present in two limbs 06/25/2017  . 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 Elevated vitamin B12 level 05/30/2017  . Vitamin D deficiency 05/29/2017  . History of tobacco abuse-  30pk yr hx - quit 04/07/17 05/21/2017  . Gait disturbance, post-stroke 05/14/2017  . Benign essential HTN   . Vitreous hemorrhage of right eye ([place])   . Adjustment disorder with mixed anxiety and depressed  mood   . 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 s/p SAH (subarachnoid hemorrhage) ([place]) 04/19/2017  . 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 Hypoxia   . Subarachnoid hemorrhage due to ruptured aneurysm ([place]) 04/07/2017  . CVA (cerebral vascular accident) ([place]) 04/07/2017  . Elevated gastrin level 01/25/2012  . Hypokalemia 01/21/2012  . Nausea & vomiting 01/20/2012  . Epigastric pain 01/20/2012  . Duodenal ulcer, acute with obstruction 10/17/2011  . S/P laparoscopic cholecystectomy 10/16/2011    Rico Junker, PT, DPT 12/30/18    3:13 PM    [facility] [address] [place], [place], [address] Phone: [phone]   Fax:  [phone]  Name: [name] Barnett MRN: [id number] Date of Birth: 05-30-1976

## 2018-12-31 ENCOUNTER — Encounter: Payer: BLUE CROSS/BLUE SHIELD | Admitting: Occupational Therapy

## 2019-01-01 ENCOUNTER — Ambulatory Visit: Payer: BLUE CROSS/BLUE SHIELD | Admitting: Physical Therapy

## 2019-01-01 DIAGNOSIS — R278 Other lack of coordination: Secondary | ICD-10-CM

## 2019-01-01 DIAGNOSIS — R260 Ataxic gait: Secondary | ICD-10-CM | POA: Diagnosis not present

## 2019-01-01 DIAGNOSIS — R2681 Unsteadiness on feet: Secondary | ICD-10-CM

## 2019-01-01 DIAGNOSIS — R2689 Other abnormalities of gait and mobility: Secondary | ICD-10-CM

## 2019-01-02 NOTE — Therapy (Signed)
[facility] [address] [place], [place], [address] Phone: [phone]   Fax:  [phone]  Physical Therapy Treatment  Patient Details  Name: ARICKA GOLDBERGER MRN: [id number] Date of Birth: 1976/04/17 Referring Provider (PT): Edmonia Lynch, MD   Encounter Date: 01/01/2019  PT End of Session - 01/02/19 1033    Visit Number  25    Number of Visits  31    Date for PT Re-Evaluation  01/09/19    Authorization Type  [employer]    PT Start Time  5573    PT Stop Time  1623    PT Time Calculation (min)  48 min    Equipment Utilized During Treatment  Gait belt    Activity Tolerance  Patient tolerated treatment well    Behavior During Therapy  WFL for tasks assessed/performed       Past Medical History:  Diagnosis Date  . Duodenal obstruction   . GERD (gastroesophageal reflux disease)   . Hypertension   . Stroke [facility])    april 2018, lt sided weakness  . Trimalleolar fracture of ankle, closed, right, initial encounter     Past Surgical History:  Procedure Laterality Date  . BALLOON DILATION  12/31/2011   Procedure: BALLOON DILATION;  Surgeon: Missy Sabins, MD;  Location: [facility] ENDOSCOPY;  Service: Endoscopy;  Laterality: N/A;  . CHOLECYSTECTOMY  07/28/11  . ESOPHAGOGASTRODUODENOSCOPY  10/17/2011   Procedure: ESOPHAGOGASTRODUODENOSCOPY (EGD);  Surgeon: Missy Sabins, MD;  Location: [facility] ENDOSCOPY;  Service: Endoscopy;  Laterality: N/A;  . IR ANGIO INTRA EXTRACRAN SEL INTERNAL CAROTID BILAT MOD SED  04/07/2017  . IR ANGIO INTRA EXTRACRAN SEL INTERNAL CAROTID BILAT MOD SED  01/07/2018  . IR ANGIO VERTEBRAL SEL VERTEBRAL UNI R MOD SED  04/07/2017  . IR ANGIO VERTEBRAL SEL VERTEBRAL UNI R MOD SED  01/07/2018  . IR ANGIOGRAM FOLLOW UP STUDY  04/07/2017  . IR ANGIOGRAM FOLLOW UP STUDY  04/07/2017  . IR ANGIOGRAM FOLLOW UP STUDY  04/07/2017  . IR ANGIOGRAM FOLLOW UP STUDY  04/07/2017  . IR ANGIOGRAM FOLLOW UP STUDY  04/07/2017  . IR ANGIOGRAM  SELECTIVE EACH ADDITIONAL VESSEL  04/07/2017  . IR TRANSCATH/EMBOLIZ  04/07/2017  . IR US GUIDE VASC ACCESS RIGHT  01/07/2018  . ORIF ANKLE FRACTURE Right 07/01/2018   Procedure: RIGHT ANKLE FRACTURE OPEN TREATMENT TRIMALLEOLAR INCLUDES INTERNAL FIXATION WITHOUT FIXATION OF POSTERIOR LIP, FRACTURE CLOSED TREATMENT TRIMALLEOLAR ANKLE WITH MANIPULATION, REPAIR SYNDESMOSIS, REPAIR DELTOID LIGAMENT;  Surgeon: Renette Butters, MD;  Location: [facility];  Service: Orthopedics;  Laterality: Right;  . PARS PLANA VITRECTOMY Right 05/01/2017   Procedure: PARS PLANA VITRECTOMY WITH 25 GAUGE RIGHT EYE, endolaser photocoaglation;  Surgeon: Jalene Mullet, MD;  Location: [facility];  Service: Ophthalmology;  Laterality: Right;  . RADIOLOGY WITH ANESTHESIA N/A 04/07/2017   Procedure: RADIOLOGY WITH ANESTHESIA;  Surgeon: Consuella Lose, MD;  Location: [facility];  Service: Radiology;  Laterality: N/A;  . REPLACEMENT TOTAL KNEE BILATERAL  2004  . TEMPOROMANDIBULAR JOINT SURGERY     2 surgeries  . TUMOR REMOVAL    . VAGOTOMY  01/23/2012   Procedure: VAGOTOMY, antrectomy and BII;  Surgeon: Haywood Lasso, MD;  Location: [facility];  Service: General;  Laterality: N/A;  Laparotomy with vagotomy.    There were no vitals filed for this visit.  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  Yes    Pain Score  4     Pain Location  Hip    Pain Orientation  Right    Pain Descriptors / Indicators   Aching;Discomfort;Sore    Pain Type  Neuropathic pain    Pain Onset  More than a month ago    Pain Frequency  Constant                       [facility] Adult PT Treatment/Exercise - 01/02/19 0001      Transfers   Sit to Stand  4: Min guard    Stand to Sit Details  pt used UE assist for controlled descent for stand to sit on mat     Floor to Transfer  4: Min guard   from blue mat on floor to mat table   Number of Reps  Other reps (comment)   3 reps for sit to stand     Ambulation/Gait   Ambulation/Gait  Yes    Ambulation/Gait Assistance  4: Min guard    Ambulation/Gait Assistance Details  gait training with rollator (blue) as this one appeared to have less maneuverability in the front wheels;  10# weight was placed in walker bag; initially tried using 2 10# weights wrapped on front of walker but pt stated she was unable to turn and maneuver the walker; weights removed for 1 lap (115') and then 10# was added to basket for remaining of treament session    Ambulation Distance (Feet)  350 Feet   150' from clinic gym out to car w/rollator   Assistive device  Rollator    Gait Pattern  Step-through pattern;Ataxic;Trunk flexed    Ambulation Surface  Level;Indoor;Outdoor;Paved      Neuro Re-ed    Neuro Re-ed Details   Pt performed standing activity at counter with bil. UE support initially, progressing to 1 UE support as able; stepping posteriorly/anteriorly with LLE 10 reps with bil. UE support; 10 reps with 1 UE support with min to mod assist with LOB due to ataxia        Neuro Re-ed:  Quadruped position on blue mat on floor by mat table; pt performed stretching by performing child's pose Hips toward heels straight back as tolerated - 10 sec hold; performed 1 rep each of hips toward Rt heels and then toward Lt heels with return to upright without UE support with min assist for trunk control  Pt performed tall kneeling - moving UE's alternating in flexion and then in  abduction/adduction with min assist for core stabilzation  Rt and Lt 1/2 kneeling with UE support on mat prn with min to mod assist with LOB  Pt performed amb. Forwards/backwards in tall kneeling without UE support with min to mod asist for balance  Floor to stand transfer to mat with CGA - pt had decreased control with this transfer but no major LOB (noted with return to sitting position on mat)   Pt gait trained with rollator from gym  to car at end of session - approx. 150' - able to negotiate rollator over a cement header  at front of parking space  In order to access passenger side of car - min assist for balance during this negotiation of environmental obstacle with rollator      PT Short Term Goals - 12/08/18 1539      PT [place] #1   Title  Pt will report ongoing compliance with HEP and fall prevention recommendations    Baseline  12/08/18: reports she has not been doing the HEP.    Status  Not Met      PT SHORT TERM GOAL #2   Title  Pt will improve gait speed to >/= 2.8 ft/esc indicative of community ambulation with LRAD     Baseline  12/08/18: pt's gait speed decreased to 2.39 ft/sec with RW, was 2.73 ft/sec when last checked with RW    Time  --    Period  --    Status  Not Met      PT SHORT TERM GOAL #3   Title  Pt will improve Berg score to >/= 42/56 indicating decreased fall risk potential     Baseline  12/08/18: 45/56 scored today    Time  --    Period  --    Status  Achieved      PT SHORT TERM GOAL #4   Title  Pt will increased TUG score to >/= 15 seconds indicating decreased risk for falls and improvement in functional mobility using LRAD    Baseline  12/08/18: 20.35 sec's with RW, improved just not to goal    Time  --    Period  --    Status  Partially Met      PT SHORT TERM GOAL #5   Title  Pt will report </= 5/10 pain in R hip during exercises, WB and gait    Baseline  12/08/18: 8/10 before and after session    Time  --    Period  --    Status   Not Met      PT SHORT TERM GOAL #6   Title  Patient will perform 12 steps with alternating stepping pattern using B HR's to improve community accessibility with close supervision    Baseline  12/08/18: met today    Time  --    Period  --    Status  Achieved        PT Long Term Goals - 12/12/18 1653      PT LONG TERM GOAL #1   Title  Pt will demonstrate independence with land and aquatic HEP    Target Date  01/09/19      PT LONG TERM GOAL #2   Title  Pt will improve Berg score to >/= 46/56 to decrease fall risk potential     Baseline  39/56 11/10/2018    Target Date  01/09/19      PT LONG TERM GOAL #3   Title  Pt will improve gait speed to >/=3.0 ft/sec indicating significant change in functional mobility with LRAD     Baseline  Pt 2.73 ft/sec with RW 11/10/2018, improved just not to goal level    Target Date  01/09/19      PT LONG TERM GOAL #4   Title  Pt will improve TUG score to </= 13.5 seconds using LRAD indicating decreased fall risk potential     Baseline  Pt performed with RW in  20.6 sec, improved just not to goal      PT LONG TERM GOAL #5   Title  Pt will negotiate 12 steps using B HR's and alternating stepping technique with supervision to improve community accessibility     Baseline  met 11-17-18    Status  Achieved      PT LONG TERM GOAL #6   Title  Pt will report ability to perform household ambulation using her RW with modified independence demonstrating significant improvement in functional mobility     Baseline  Reports intermittent use of rolling chair; and use of RW dpending on the day.    Target Date  01/09/19      PT LONG TERM GOAL #7   Title  Pt will ambulate outdoors over paved surfaces and perform curbs and inclines with supervision using LRAD for 300 feet to improve functional mobility with community distances     Target Date  01/09/19      PT LONG TERM GOAL #8   Title  --    Target Date  --            Plan - 01/02/19 1033    Clinical  Impression Statement  Pt did well with use of 10# placed in rollator basket for increased weighting/stabilization of RW during gait training.  Pt used rollator for assistance with amb. from clinic gym to car with min guard with cues to stay close to rollator:  pt had no major LOB with use of rollator during gait training inside nor outdoors to car at end of session.  Pt did have moderate difficulty performing backwards ambulation in tall kneeling with LLE (difficulty with Lt hip extension in tall kneeling).  Pt making slow steady progress towards goals.      Rehab Potential  Good    Clinical Impairments Affecting Rehab Potential  high risk for falls due to abnormal gait, ataxia, sensory changes, and impaired cognition.     PT Frequency  2x / week    PT Duration  8 weeks    PT Treatment/Interventions  ADLs/Self Care Home Management;Aquatic Therapy;DME Instruction;Gait training;Stair training;Functional mobility training;Therapeutic activities;Therapeutic exercise;Balance training;Neuromuscular re-education;Patient/family education;Cognitive remediation;Orthotic Fit/Training;Electrical Stimulation;Cryotherapy;Moist Heat;Manual techniques;Passive range of motion;Taping;Energy conservation;Dry needling    PT Next Visit Plan  Begin checking LTG's - I will finish goal assessment and complete renewal on 01-08-19 visit:  Gait training with rollator (front end weighted down with 20lb).  TDN to lumbar multifidus/quad/TFL/adductor/hip flexor; proximal stability and core activation in sidelying/supine (pilates?), tall kneeling (trunk stability during UE movement/resistance with theraband) and standing, focus on decreased use of lumbar extensors during gait, cont to work on balance, gait and trunk stabilization activities    Pt stated she would call back on Fri., 01-02-19 to schedule additional PT visits   PT Home Exercise Plan  Access Code: [email or web address]     Consulted and Agree with Plan of Care  Patient;Family  member/caregiver    Family Member Consulted  Spouse       Patient will benefit from skilled therapeutic intervention in order to improve the following deficits and impairments:  Abnormal gait, Decreased balance, Decreased cognition, Decreased coordination, Decreased strength, Difficulty walking, Impaired sensation, Pain, Decreased activity tolerance, Decreased mobility, Decreased endurance, Postural dysfunction, Decreased knowledge of use of DME, Impaired perceived functional ability, Decreased range of motion, Impaired flexibility  Visit Diagnosis: Other abnormalities of gait and mobility  Unsteadiness on feet  Other lack of coordination     Problem List Patient Active Problem  List   Diagnosis Date Noted  . Trimalleolar fracture of ankle, closed, right, initial encounter 11/27/2018  . Tobacco use disorder 11/27/2018  . Osteoarthritis of right hip 10/07/2018  . 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 Weakness with dizziness-  since [facility] and CVA 04/07/17 08/07/2017  . Disturbances of vision, late effect of stroke 08/06/2017  . Health education/counseling 08/05/2017  . High risk medications (not anticoagulants) long-term use 08/05/2017  . Neuritis-  R sided:  arm and leg/ body due to stroke 08/05/2017  . Elevated LDL cholesterol level 07/11/2017  . [employer] [facility] [employer] (NIH) Stroke Scale limb ataxia score 2, ataxia present in two limbs 06/25/2017  . 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 Elevated vitamin B12 level 05/30/2017  . Vitamin D deficiency 05/29/2017  . History of tobacco abuse-  30pk yr hx - quit 04/07/17 05/21/2017  . Gait disturbance, post-stroke 05/14/2017  . Benign essential HTN   . Vitreous hemorrhage of right eye ([place])   . Adjustment disorder with mixed anxiety and depressed mood   . 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 s/p SAH (subarachnoid hemorrhage) ([place]) 04/19/2017   . 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 Hypoxia   . Subarachnoid hemorrhage due to ruptured aneurysm ([place]) 04/07/2017  . CVA (cerebral vascular accident) ([place]) 04/07/2017  . Elevated gastrin level 01/25/2012  . Hypokalemia 01/21/2012  . Nausea & vomiting 01/20/2012  . Epigastric pain 01/20/2012  . Duodenal ulcer, acute with obstruction 10/17/2011  . S/P laparoscopic cholecystectomy 10/16/2011    Alda Lea, PT 01/02/2019, 10:41 AM  [facility] [address] [place], [place], [address] Phone: [phone]   Fax:  [phone]  Name: DAIZEE FIRMIN MRN: [id number] Date of Birth: 10-Oct-1976

## 2019-01-05 ENCOUNTER — Encounter: Payer: Self-pay | Admitting: Physical Therapy

## 2019-01-05 ENCOUNTER — Ambulatory Visit: Payer: BLUE CROSS/BLUE SHIELD | Admitting: Physical Therapy

## 2019-01-05 DIAGNOSIS — R2689 Other abnormalities of gait and mobility: Secondary | ICD-10-CM

## 2019-01-05 DIAGNOSIS — M79651 Pain in right thigh: Secondary | ICD-10-CM

## 2019-01-05 DIAGNOSIS — R2681 Unsteadiness on feet: Secondary | ICD-10-CM

## 2019-01-05 DIAGNOSIS — R278 Other lack of coordination: Secondary | ICD-10-CM

## 2019-01-05 DIAGNOSIS — R260 Ataxic gait: Secondary | ICD-10-CM | POA: Diagnosis not present

## 2019-01-05 NOTE — Therapy (Signed)
[place] [address] [place], [place], [address] Phone: [phone]   Fax:  [phone]  Physical Therapy Treatment  Patient Details  Name: [name] Barnett MRN: [id number] Date of Birth: [birth year] Referring Provider (PT): Edmonia Lynch, MD   Encounter Date: 01/05/2019  PT End of Session - 01/05/19 2047    Visit Number  26    Number of Visits  31    Date for PT Re-Evaluation  01/09/19    Authorization Type  BCBS    PT Start Time  1445    PT Stop Time  1530    PT Time Calculation (min)  45 min    Activity Tolerance  Patient tolerated treatment well    Behavior During Therapy  [facility] for tasks assessed/performed       Past Medical History:  Diagnosis Date  . Duodenal obstruction   . GERD (gastroesophageal reflux disease)   . Hypertension   . Stroke [facility])    april 2018, lt sided weakness  . Trimalleolar fracture of ankle, closed, right, initial encounter     Past Surgical History:  Procedure Laterality Date  . BALLOON DILATION  12/31/2011   Procedure: BALLOON DILATION;  Surgeon: Missy Sabins, MD;  Location: [facility] ENDOSCOPY;  Service: Endoscopy;  Laterality: N/A;  . CHOLECYSTECTOMY  07/28/11  . ESOPHAGOGASTRODUODENOSCOPY  10/17/2011   Procedure: ESOPHAGOGASTRODUODENOSCOPY (EGD);  Surgeon: Missy Sabins, MD;  Location: [facility] ENDOSCOPY;  Service: Endoscopy;  Laterality: N/A;  . IR ANGIO INTRA EXTRACRAN SEL INTERNAL CAROTID BILAT MOD SED  04/07/2017  . IR ANGIO INTRA EXTRACRAN SEL INTERNAL CAROTID BILAT MOD SED  01/07/2018  . IR ANGIO VERTEBRAL SEL VERTEBRAL UNI R MOD SED  04/07/2017  . IR ANGIO VERTEBRAL SEL VERTEBRAL UNI R MOD SED  01/07/2018  . IR ANGIOGRAM FOLLOW UP STUDY  04/07/2017  . IR ANGIOGRAM FOLLOW UP STUDY  04/07/2017  . IR ANGIOGRAM FOLLOW UP STUDY  04/07/2017  . IR ANGIOGRAM FOLLOW UP STUDY  04/07/2017  . IR ANGIOGRAM FOLLOW UP STUDY  04/07/2017  . IR ANGIOGRAM SELECTIVE EACH ADDITIONAL VESSEL  04/07/2017  . IR  TRANSCATH/EMBOLIZ  04/07/2017  . IR US GUIDE VASC ACCESS RIGHT  01/07/2018  . ORIF ANKLE FRACTURE Right 07/01/2018   Procedure: RIGHT ANKLE FRACTURE OPEN TREATMENT TRIMALLEOLAR INCLUDES INTERNAL FIXATION WITHOUT FIXATION OF POSTERIOR LIP, FRACTURE CLOSED TREATMENT TRIMALLEOLAR ANKLE WITH MANIPULATION, REPAIR SYNDESMOSIS, REPAIR DELTOID LIGAMENT;  Surgeon: Renette Butters, MD;  Location: [place];  Service: Orthopedics;  Laterality: Right;  . PARS PLANA VITRECTOMY Right 05/01/2017   Procedure: PARS PLANA VITRECTOMY WITH 25 GAUGE RIGHT EYE, endolaser photocoaglation;  Surgeon: Jalene Mullet, MD;  Location: [place];  Service: Ophthalmology;  Laterality: Right;  . RADIOLOGY WITH ANESTHESIA N/A 04/07/2017   Procedure: RADIOLOGY WITH ANESTHESIA;  Surgeon: Consuella Lose, MD;  Location: [place];  Service: Radiology;  Laterality: N/A;  . REPLACEMENT TOTAL KNEE BILATERAL  2004  . TEMPOROMANDIBULAR JOINT SURGERY     2 surgeries  . TUMOR REMOVAL    . VAGOTOMY  01/23/2012   Procedure: VAGOTOMY, antrectomy and BII;  Surgeon: Haywood Lasso, MD;  Location: [place];  Service: General;  Laterality: N/A;  Laparotomy with vagotomy.    There were no vitals filed for this visit.  Subjective Assessment - 01/05/19 1453    Subjective  Everything going well.  Goes to see Dr. Letta Pate tomorrow.  Is going to ask for an order for rollator.  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  No/denies    Pain Onset  More than a month ago         [facility] PT Assessment - 01/05/19 1456      Standardized Balance Assessment   Standardized Balance Assessment  Berg Balance Test;Timed Up and Go Test;10 meter walk test    10 Meter  Walk  16.88 seconds with rollator and supervision or 1.95 ft/sec.  With rollator 15.10 seconds or 2.17 ft/sec      Berg Balance Test   Sit to Stand  Able to stand without using hands and stabilize independently    Standing Unsupported  Able to stand safely 2 minutes    Sitting with Back Unsupported but Feet Supported on Floor or Stool  Able to sit safely and securely 2 minutes    Stand to Sit  Sits safely with minimal use of hands    Transfers  Able to transfer safely, definite need of hands    Standing Unsupported with Eyes Closed  Able to stand 10 seconds safely    Standing Ubsupported with Feet Together  Able to place feet together independently and stand 1 minute safely    From Standing, Reach Forward with Outstretched Arm  Can reach confidently >25 cm (10")    From Standing Position, Pick up Object from Floor  Able to pick up shoe safely and easily    From Standing Position, Turn to Look Behind Over each Shoulder  Looks behind one side only/other side shows less weight shift    Turn 360 Degrees  Needs assistance while turning    Standing Unsupported, Alternately Place Feet on Step/Stool  Able to complete >2 steps/needs minimal assist    Standing Unsupported, One Foot in Front  Able to plae foot ahead of the other independently and hold 30 seconds    Standing on One Leg  Tries to lift leg/unable to hold 3 seconds but remains standing independently    Total Score  43    Berg comment:  43/56      Timed Up and Go Test   TUG  Normal TUG    Normal TUG (seconds)  23.37    TUG Comments  with RW; with rollator pt performed in 19.91 seconds                           PT Education - 01/05/19 2046    Education provided  Yes    Education Details  goal assessment and performance RW vs. rollator; plan to add more visits and areas to continue to focus on     Person(s) Educated  Patient;Spouse    Methods  Explanation    Comprehension  Verbalized understanding       PT Short  Term Goals - 12/08/18 1539      PT SHORT TERM GOAL #1   Title  Pt will report ongoing compliance with HEP and fall prevention recommendations    Baseline  12/08/18: reports she has not been doing the HEP.    Status  Not Met      PT SHORT TERM GOAL #2   Title  Pt will improve gait speed to >/= 2.8 ft/esc indicative of community ambulation with LRAD     Baseline  12/08/18: pt's gait speed decreased to 2.39 ft/sec with RW, was 2.73 ft/sec when last checked with RW    Time  --    Period  --    Status  Not Met      PT SHORT TERM GOAL #3   Title  Pt will improve Berg score to >/= 42/56 indicating decreased fall risk potential     Baseline  12/08/18: 45/56 scored today    Time  --    Period  --    Status  Achieved      PT SHORT TERM GOAL #4   Title  Pt will increased TUG score to >/= 15 seconds indicating decreased risk for falls and improvement in functional mobility using LRAD    Baseline  12/08/18: 20.35 sec's with RW, improved just not to goal    Time  --    Period  --    Status  Partially Met      PT SHORT TERM GOAL #5   Title  Pt will report </= 5/10 pain in R hip during exercises, WB and gait    Baseline  12/08/18: 8/10 before and after session    Time  --    Period  --    Status  Not Met      PT SHORT TERM GOAL #6   Title  Patient will perform 12 steps with alternating stepping pattern using B HR's to improve community accessibility with close supervision    Baseline  12/08/18: met today    Time  --    Period  --    Status  Achieved        PT Long Term Goals - 01/05/19 1454      PT LONG TERM GOAL #1   Title  Pt will demonstrate independence with land and aquatic HEP    Status  On-going      PT LONG TERM GOAL #2   Title  Pt will improve Berg score to >/= 46/56 to decrease fall risk potential     Baseline  43/56    Status  Partially Met      PT LONG TERM GOAL #3   Title  Pt will improve gait speed to >/=3.0 ft/sec indicating significant change in functional  mobility with LRAD     Baseline  1.9 with RW, 2.17 with rollator    Status  Not Met      PT LONG TERM GOAL #4   Title  Pt will improve TUG score to </= 13.5 seconds using LRAD indicating decreased fall risk potential     Baseline  23.37 with rolling walker, 19.91 with rollator    Status  Not Met      PT LONG TERM GOAL #5   Title  Pt will negotiate 12 steps using B HR's and alternating stepping technique with supervision to improve community accessibility     Baseline  met 11-17-18    Status  Achieved      PT LONG TERM GOAL #6   Title  Pt will report ability to perform household ambulation using her RW with modified independence demonstrating significant improvement in functional mobility     Status  Achieved      PT LONG TERM GOAL #7   Title  Pt will ambulate outdoors over paved surfaces and perform curbs and inclines with supervision using LRAD  for 300 feet to improve functional mobility with community distances     Status  On-going            Plan - 01/05/19 2051    Clinical Impression Statement  Initiated assessment of progress towards LTG.  Pt has met LTG #5 and 6, partially met #2 but did not meet gait velocity or TUG goal with rolling walker.  Compared use of rolling walker with rollator for gait velocity and TUG to determine safety with rollator.  Pt demonstrated increased gait velocity and decreased TUG time with rollator as compared to RW.  Pt is demonstrating improved gait safety and improved control with rollator.  Will benefit from continued training with rollator but pt has demonstrated progress and may benefit from use of rollator for more efficient gait and to allow rest breaks ambulating in community with supervision.  Will complete assessment of final 2 LTG next session and plan to recertify to continue to address vestibular, coordination, balance, and gait impairments.       Rehab Potential  Good    Clinical Impairments Affecting Rehab Potential  high risk for falls  due to abnormal gait, ataxia, sensory changes, and impaired cognition.     PT Frequency  2x / week    PT Duration  8 weeks    PT Treatment/Interventions  ADLs/Self Care Home Management;Aquatic Therapy;DME Instruction;Gait training;Stair training;Functional mobility training;Therapeutic activities;Therapeutic exercise;Balance training;Neuromuscular re-education;Patient/family education;Cognitive remediation;Orthotic Fit/Training;Electrical Stimulation;Cryotherapy;Moist Heat;Manual techniques;Passive range of motion;Taping;Energy conservation;Dry needling;Vestibular    PT Next Visit Plan  finish goal assessment (1, 7) and complete renewal on 01-08-19 visit:  Need to update HEP and focus on turning/pivoting/rotation, SLS/taps, VOR.  Gait training with rollator (front end weighted down with 20lb).  TDN to lumbar multifidus/quad/TFL/adductor/hip flexor; proximal stability and core activation in sidelying/supine (pilates?), tall kneeling (trunk stability during UE movement/resistance with theraband) and standing, focus on decreased use of lumbar extensors during gait, cont to work on balance, gait and trunk stabilization activities     PT Home Exercise Plan  Access Code: [email or web address]     Consulted and Agree with Plan of Care  Patient;Family member/caregiver    Family Member Consulted  Spouse       Patient will benefit from skilled therapeutic intervention in order to improve the following deficits and impairments:  Abnormal gait, Decreased balance, Decreased cognition, Decreased coordination, Decreased strength, Difficulty walking, Impaired sensation, Pain, Decreased activity tolerance, Decreased mobility, Decreased endurance, Postural dysfunction, Decreased knowledge of use of DME, Impaired perceived functional ability, Decreased range of motion, Impaired flexibility, Dizziness  Visit Diagnosis: Ataxic gait  Other lack of coordination  Other abnormalities of gait and mobility  Unsteadiness on feet  Pain  in right thigh     Problem List Patient Active Problem List   Diagnosis Date Noted  . Trimalleolar fracture of ankle, closed, right, initial encounter 11/27/2018  . Tobacco use disorder 11/27/2018  . Osteoarthritis of right hip 10/07/2018  . 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 Weakness with dizziness-  since [facility] and CVA 04/07/17 08/07/2017  . Disturbances of vision, late effect of stroke 08/06/2017  . Health education/counseling 08/05/2017  . High risk medications (not anticoagulants) long-term use 08/05/2017  . Neuritis-  R sided:  arm and leg/ body due to stroke 08/05/2017  . Elevated LDL cholesterol level 07/11/2017  . [employer] (NIH) Stroke Scale limb ataxia score 2, ataxia present in two limbs 06/25/2017  . 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  Elevated vitamin B12 level 05/30/2017  . Vitamin D deficiency 05/29/2017  . History of tobacco abuse-  30pk yr hx - quit 04/07/17 05/21/2017  . Gait disturbance, post-stroke 05/14/2017  . Benign essential HTN   . Vitreous hemorrhage of right eye ([place])   . Adjustment disorder with mixed anxiety and depressed mood   . 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 s/p SAH (subarachnoid hemorrhage) ([place]) 04/19/2017  . 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 Hypoxia   . Subarachnoid hemorrhage due to ruptured aneurysm ([place]) 04/07/2017  . CVA (cerebral vascular accident) ([place]) 04/07/2017  . Elevated gastrin level 01/25/2012  . Hypokalemia 01/21/2012  . Nausea & vomiting 01/20/2012  . Epigastric pain 01/20/2012  . Duodenal ulcer, acute with obstruction 10/17/2011  . S/P laparoscopic cholecystectomy 10/16/2011    Rico Junker, PT, DPT 01/05/19    9:08 PM    [place] [name] [place], [place], [address] Phone: [phone]   Fax:  [phone]  Name: [name] Barnett MRN: [id number] Date of Birth: 13-Nov-1976

## 2019-01-06 ENCOUNTER — Encounter: Payer: Self-pay | Admitting: Physical Medicine & Rehabilitation

## 2019-01-06 ENCOUNTER — Ambulatory Visit (HOSPITAL_BASED_OUTPATIENT_CLINIC_OR_DEPARTMENT_OTHER): Payer: BLUE CROSS/BLUE SHIELD | Admitting: Physical Medicine & Rehabilitation

## 2019-01-06 ENCOUNTER — Encounter: Payer: BLUE CROSS/BLUE SHIELD | Attending: Physical Medicine & Rehabilitation

## 2019-01-06 VITALS — BP 135/84 | HR 77 | Ht 64.0 in | Wt 151.0 lb

## 2019-01-06 DIAGNOSIS — I69393 Ataxia following cerebral infarction: Secondary | ICD-10-CM | POA: Insufficient documentation

## 2019-01-06 DIAGNOSIS — M1611 Unilateral primary osteoarthritis, right hip: Secondary | ICD-10-CM

## 2019-01-06 DIAGNOSIS — I608 Other nontraumatic subarachnoid hemorrhage: Secondary | ICD-10-CM

## 2019-01-06 NOTE — Patient Instructions (Signed)
Referral to [facility]

## 2019-01-06 NOTE — Progress Notes (Signed)
Subjective:    Patient ID: [name] Barnett, female    DOB: 1976/10/08, 43 y.o.   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  Initially completely blind.  She regained her eyesight was seen by ophthalmology while in the hospital.  She went through inpatient rehabilitation at the [facility] inpatient stroke rehab program and then was transitioned to home health and ultimately to outpatient therapy. She has residual deficits on the left with hemiataxia.  She had right-sided groin pain as well as right-sided pain and temperature deficits due to CVA.  Initially this pain appear to be neuropathic but further work-up revealed that patient also has osteoarthritis of the right hip.  She underwent fluoroscopically guided intra-articular hip injection in 2019 which produced a 72-month relief initially and a repeat injection only gave less than 1 day relief. She still has hip pain but ambulates with a walker.  She is now training with a Rollator walker and would like a prescription for this.  She states he can turn easier with a Rollator than with a wheeled walker.  She does use a walker inside and outside the home.  She is not driving Pain Inventory Average Pain 6 Pain Right Now 2 My pain is sharp and aching  In the last 24 hours, has pain interfered with the following? General activity 1 Relation with others 1 Enjoyment of life 1 What TIME of day is your pain at its worst? all Sleep (in general) Fair  Pain is worse with: bending, sitting and inactivity Pain improves with: therapy/exercise, pacing activities and medication Relief from Meds: 4  Mobility walk with assistance use a walker ability to climb steps?  yes do you drive?  no  Function disabled: date disabled .  Neuro/Psych bladder control problems  Prior Studies Any  changes since last visit?  no  Physicians involved in your care Any changes since last visit?  no   Family History  Problem Relation Age of Onset  . Hypertension Mother   . Restless legs syndrome Mother   . Hyperlipidemia Mother   . Heart disease Father   . Malignant hyperthermia Neg Hx    Social History   Socioeconomic History  . Marital status: Married    Spouse name: Not on file  . Number of children: Not on file  . Years of education: Not on file  . Highest education level: Not on file  Occupational History  . Not on file  Social Needs  . Financial resource strain: Not on file  . Food insecurity:    Worry: Not on file    Inability: Not on file  . Transportation needs:    Medical: Not on file    Non-medical: Not on file  Tobacco Use  . Smoking status: Current Every Day Smoker    Packs/day: 1.00    Years: 20.00    Pack years: 20.00    Types: Cigarettes    Last attempt to quit: 06/24/2018    Years since quitting: 0.5  . Smokeless tobacco: Never Used  Substance and Sexual Activity  . Alcohol use: Yes    Comment: social  . Drug use: No  . Sexual activity: Not Currently    Partners: Male    Birth control/protection: None  Lifestyle  . Physical activity:    Days per week: Not on  file    Minutes per session: Not on file  . Stress: Not on file  Relationships  . Social connections:    Talks on phone: Not on file    Gets together: Not on file    Attends religious service: Not on file    Active member of club or organization: Not on file    Attends meetings of clubs or organizations: Not on file    Relationship status: Not on file  Other Topics Concern  . Not on file  Social History Narrative   Lives in [place] with husband and 2 kids. Works as a Psychologist, prison and probation services.   Past Surgical History:  Procedure Laterality Date  . BALLOON DILATION  12/31/2011   Procedure: BALLOON DILATION;  Surgeon: Barrie Folk, MD;  Location: [facility] ENDOSCOPY;  Service: Endoscopy;   Laterality: N/A;  . CHOLECYSTECTOMY  07/28/11  . ESOPHAGOGASTRODUODENOSCOPY  10/17/2011   Procedure: ESOPHAGOGASTRODUODENOSCOPY (EGD);  Surgeon: Barrie Folk, MD;  Location: [facility] END[facility]COPY;  Service: Endoscopy;  Laterality: N/A;  . IR ANGIO INTRA EXTRACRAN SEL INTERNAL CAROTID BILAT MOD SED  04/07/2017  . IR ANGIO INTRA EXTRACRAN SEL INTERNAL CAROTID BILAT MOD SED  01/07/2018  . IR ANGIO VERTEBRAL SEL VERTEBRAL UNI R MOD SED  04/07/2017  . IR ANGIO VERTEBRAL SEL VERTEBRAL UNI R MOD SED  01/07/2018  . IR ANGIOGRAM FOLLOW UP STUDY  04/07/2017  . IR ANGIOGRAM FOLLOW UP STUDY  04/07/2017  . IR ANGIOGRAM FOLLOW UP STUDY  04/07/2017  . IR ANGIOGRAM FOLLOW UP STUDY  04/07/2017  . IR ANGIOGRAM FOLLOW UP STUDY  04/07/2017  . IR ANGIOGRAM SELECTIVE EACH ADDITIONAL VESSEL  04/07/2017  . IR TRANSCATH/EMBOLIZ  04/07/2017  . IR US GUIDE VASC ACCESS RIGHT  01/07/2018  . ORIF ANKLE FRACTURE Right 07/01/2018   Procedure: RIGHT ANKLE FRACTURE OPEN TREATMENT TRIMALLEOLAR INCLUDES INTERNAL FIXATION WITHOUT FIXATION OF POSTERIOR LIP, FRACTURE CLOSED TREATMENT TRIMALLEOLAR ANKLE WITH MANIPULATION, REPAIR SYNDESMOSIS, REPAIR DELTOID LIGAMENT;  Surgeon: Sheral Apley, MD;  Location[facility];  Service: Orthopedics;  Laterality: Right;  . PARS PLANA VITRECTOMY Right 05/01/2017   Procedure: PARS PLANA VITRECTOMY WITH 25 GAUGE RIGHT EYE, endolaser photocoaglation;  Surgeon: Carmela Rima, MD;  [facility];  Service: Ophthalmology;  Laterality: Right;  . RADIOLOGY WITH ANESTHESIA N/A 04/07/2017   Procedure: RADIOLOGY WITH ANESTHESIA;  Surgeon: Lisbeth Renshaw, MD;  Location: [facility] OR;  Service: Radiology;  Laterality: N/A;  . REPLACEMENT TOTAL KNEE BILATERAL  2004  . TEMPOROMANDIBULAR JOINT SURGERY     2 surgeries  . TUMOR REMOVAL    . VAGOTOMY  01/23/2012   Procedure: VAGOTOMY, antrectomy and BII;  Surgeon: Currie Paris, MD;  Location: [facility] OR;  Service: General;  Laterality: N/A;  Laparotomy with  vagotomy.   Past Medical History:  Diagnosis Date  . Duodenal obstruction   . GERD (gastroesophageal reflux disease)   . Hypertension   . Stroke [facility])    april 2018, lt sided weakness  . Trimalleolar fracture of ankle, closed, right, initial encounter    There were no vitals taken for this visit.  Opioid Risk Score:   Fall Risk Score:  `1  Depression screen PHQ 2/9  Depression screen [facility] 2/9 11/27/2018 05/21/2018 05/21/2018 02/17/2018 01/13/2018 08/01/2017 07/11/2017  Decreased Interest 0 0 0 2 1 0 2  Down, Depressed, Hopeless 0 0 0 1 0 0 1  PHQ - 2 Score 0 0 0 3 1 0 3  Altered sleeping 0 1 -  0 0 0 0  Tired, decreased energy 0 1 - 2 3 1 3   Change in appetite 0 1 - 0 0 0 0  Feeling bad or failure about yourself  0 0 - 0 0 0 0  Trouble concentrating 0 0 - 0 0 0 1  Moving slowly or fidgety/restless 0 0 - 0 0 0 3  Suicidal thoughts 0 0 - 0 0 0 1  PHQ-9 Score 0 3 - 5 4 1 11   Difficult doing work/chores Not difficult at all Not difficult at all - Not difficult at all Somewhat difficult - -  Some recent data might be hidden     Review of Systems  Constitutional: Negative.   HENT: Negative.   Eyes: Negative.   Respiratory: Negative.   Cardiovascular: Negative.   Gastrointestinal: Negative.   Endocrine: Negative.   Genitourinary: Positive for difficulty urinating.  Musculoskeletal: Negative.   Skin: Negative.   Allergic/Immunologic: Negative.   Neurological: Negative.   Hematological: Negative.   Psychiatric/Behavioral: Negative.   All other systems reviewed and are negative.      Objective:   Physical Exam Vitals signs and nursing note reviewed.  Constitutional:      Appearance: Normal appearance.  HENT:     Head: Normocephalic and atraumatic.     Mouth/Throat:     Mouth: Mucous membranes are moist.  Eyes:     Extraocular Movements: Extraocular movements intact.     Pupils: Pupils are equal, round, and reactive to light.  Neurological:     General: No focal deficit  present.     Mental Status: She is alert.  Psychiatric:        Mood and Affect: Mood normal.     Left finger-nose-finger with moderate ataxia left heel-to-shin with mild to moderate ataxia right finger-nose-finger is normal She ambulates with a walker she has bilateral Trendelenburg gait.  She has problems with limb ataxia foot placement with the left lower extremity she kicks the walker on every other step. Speech without dysarthria or aphasia. Visual fields are intact Extraocular muscles are intact Right hip has decreased internal rotation relatively preserved external rotation left hip has full range of motion with internal and external rotation      Assessment & Plan:  1.  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  Her extraocular muscle weakness has improved.  Her subarachnoid hemorrhage is cleared. Overall she is made a good recovery but she will have permanent deficits. We discussed her assistive device and I agree that the Rollator would be a better option for her. 2.  Right hip pain osteoarthritis we discussed treatment options including potential radiofrequency of the nerves innervating the right hip.  I discussed that I do not perform that procedure on the hip and can make referral to [facility] for further evaluation. The patient no longer needs to follow-up with me as she has reached a functional plateau.  We discussed some longer term issues such as adjustment and discussed resources such as the stroke support group.  She states she does not want to go at this point.  We also discussed individualized neuropsychology treatments available at this office with Dr. Kieth Brightlyodenbough.  She feels like she is "in a good place right now". Follow-up physical medicine rehab PRN  Over half of the 25 min visit was spent counseling and coordinating care.

## 2019-01-07 ENCOUNTER — Ambulatory Visit: Payer: BLUE CROSS/BLUE SHIELD | Admitting: Occupational Therapy

## 2019-01-07 ENCOUNTER — Encounter: Payer: Self-pay | Admitting: Occupational Therapy

## 2019-01-07 ENCOUNTER — Encounter: Payer: BLUE CROSS/BLUE SHIELD | Admitting: Occupational Therapy

## 2019-01-07 DIAGNOSIS — R208 Other disturbances of skin sensation: Secondary | ICD-10-CM

## 2019-01-07 DIAGNOSIS — R293 Abnormal posture: Secondary | ICD-10-CM

## 2019-01-07 DIAGNOSIS — R2681 Unsteadiness on feet: Secondary | ICD-10-CM

## 2019-01-07 DIAGNOSIS — R270 Ataxia, unspecified: Secondary | ICD-10-CM

## 2019-01-07 DIAGNOSIS — R278 Other lack of coordination: Secondary | ICD-10-CM

## 2019-01-07 DIAGNOSIS — I69054 Hemiplegia and hemiparesis following nontraumatic subarachnoid hemorrhage affecting left non-dominant side: Secondary | ICD-10-CM

## 2019-01-07 DIAGNOSIS — R260 Ataxic gait: Secondary | ICD-10-CM | POA: Diagnosis not present

## 2019-01-07 NOTE — Therapy (Signed)
[facility] [address] [place], [place], [address] Phone: [phone]   Fax:  [phone]  Occupational Therapy Treatment  Patient Details  Name: [name] Barnett MRN: [id number] Date of Birth: May 31, 1976 Referring Provider (OT): Dr. Alysia Penna   Encounter Date: 01/07/2019  OT End of Session - 01/07/19 2215    Visit Number  [id number]    Number of Visits  92    Date for OT Re-Evaluation  03/04/19    Authorization Type  [employer], no visit limit/auth req.    OT Start Time  1501    OT Stop Time  1545    OT Time Calculation (min)  44 min    Activity Tolerance  Patient tolerated treatment well       Past Medical History:  Diagnosis Date  . Duodenal obstruction   . GERD (gastroesophageal reflux disease)   . Hypertension   . Stroke [facility])    april 2018, lt sided weakness  . Trimalleolar fracture of ankle, closed, right, initial encounter     Past Surgical History:  Procedure Laterality Date  . BALLOON DILATION  12/31/2011   Procedure: BALLOON DILATION;  Surgeon: Missy Sabins, MD;  Location: [facility] ENDOSCOPY;  Service: Endoscopy;  Laterality: N/A;  . CHOLECYSTECTOMY  07/28/11  . ESOPHAGOGASTRODUODENOSCOPY  10/17/2011   Procedure: ESOPHAGOGASTRODUODENOSCOPY (EGD);  Surgeon: Missy Sabins, MD;  Location: [facility] ENDOSCOPY;  Service: Endoscopy;  Laterality: N/A;  . IR ANGIO INTRA EXTRACRAN SEL INTERNAL CAROTID BILAT MOD SED  04/07/2017  . IR ANGIO INTRA EXTRACRAN SEL INTERNAL CAROTID BILAT MOD SED  01/07/2018  . IR ANGIO VERTEBRAL SEL VERTEBRAL UNI R MOD SED  04/07/2017  . IR ANGIO VERTEBRAL SEL VERTEBRAL UNI R MOD SED  01/07/2018  . IR ANGIOGRAM FOLLOW UP STUDY  04/07/2017  . IR ANGIOGRAM FOLLOW UP STUDY  04/07/2017  . IR ANGIOGRAM FOLLOW UP STUDY  04/07/2017  . IR ANGIOGRAM FOLLOW UP STUDY  04/07/2017  . IR ANGIOGRAM FOLLOW UP STUDY  04/07/2017  . IR ANGIOGRAM SELECTIVE EACH ADDITIONAL VESSEL  04/07/2017  . IR TRANSCATH/EMBOLIZ  04/07/2017  .  IR US GUIDE VASC ACCESS RIGHT  01/07/2018  . ORIF ANKLE FRACTURE Right 07/01/2018   Procedure: RIGHT ANKLE FRACTURE OPEN TREATMENT TRIMALLEOLAR INCLUDES INTERNAL FIXATION WITHOUT FIXATION OF POSTERIOR LIP, FRACTURE CLOSED TREATMENT TRIMALLEOLAR ANKLE WITH MANIPULATION, REPAIR SYNDESMOSIS, REPAIR DELTOID LIGAMENT;  Surgeon: Renette Butters, MD;  Location: [facility];  Service: Orthopedics;  Laterality: Right;  . PARS PLANA VITRECTOMY Right 05/01/2017   Procedure: PARS PLANA VITRECTOMY WITH 25 GAUGE RIGHT EYE, endolaser photocoaglation;  Surgeon: Jalene Mullet, MD;  Location: [facility];  Service: Ophthalmology;  Laterality: Right;  . RADIOLOGY WITH ANESTHESIA N/A 04/07/2017   Procedure: RADIOLOGY WITH ANESTHESIA;  Surgeon: Consuella Lose, MD;  Location: [facility];  Service: Radiology;  Laterality: N/A;  . REPLACEMENT TOTAL KNEE BILATERAL  2004  . TEMPOROMANDIBULAR JOINT SURGERY     2 surgeries  . TUMOR REMOVAL    . VAGOTOMY  01/23/2012   Procedure: VAGOTOMY, antrectomy and BII;  Surgeon: Haywood Lasso, MD;  Location: [facility];  Service: General;  Laterality: N/A;  Laparotomy with vagotomy.    There were no vitals filed for this visit.  Subjective Assessment - 01/07/19 2210    Subjective   I can tell I haven't been in the pool for awhile - my balance is worse    Patient is accompained by:  Family member   mom  Pertinent History  Subarachnoid hemorrhage due to ruptured aneurysm, ataxia (L cerebellar); HTN, R side decr sensation, vertical diplopia/visual deficits, ataxia, Bilateral retinal hemorhage associated with SAH (with surgery on R eye in hospital and L approx 1 month ago)    Limitations  visual deficits, fall risk, impulsivity/cognitive deficits    Patient Stated Goals  I want to be able to walk without assistance so I can do all the things I need to do as a wife and mom, use my hand better.     Currently in Pain?  No/denies          Patient seen for aquatic therapy today.   Treatment took place in water 2.5-4 feet deep depending upon activity.  Pt entered the pool via steps using 2 hand rails, contact guard and mod cues for impulsivity, attention and safety.  Treatment focused on functional ambulation using large noodle for UE support and to create closed chain activity.  At beginning of session, pt was very distracted, verbose and impulsive.  Pt required significant repetition, mod a, mod cueing and use of low tone of voice to quiet patient and facilitate more slow controlled movements.  Treatment focused on use of closed chain activities to include use of body weight and buoyancy for resistance using noodle for LE strengthening and control demanding slow smooth controlled movements of LE alternating with controlled stabilization of LE (first RLE and then LLE).  Also addressed use of side stepping R and then L with UE's placed on pool edge to create closed chain and with min a and max cues for slow controlled steps.  Addressed walking forward and backward with 1 UE supported on pool wall and 1 UE on therapists arm to create closed chain - pt initially require mod a and max cues for postural alignment and control as well as normalizing step length.  With repetition, pt's performance improved and pt also began to demonstrate improved attention and decreased impulsivity.  Addressed core, UE and LE strengthening as well as decreasing ataxia using activities in modified quadruped on stairs as well as plank position using body weight for resistance and weight bearing.  By end of session, pt required only intermittent min a and min cues for functional ambulation using large noodle for UE support. Pt exited stairs using 2 hand rails, cues to maintain UE's in contact with railing and close supervision doing 1 step at a time.                      OT Short Term Goals - 01/07/19 2210      OT SHORT TERM GOAL #1   Title  Pt will verbalize understanding of basic water  principles in prep for aquatic HEP - 02/04/2019    Status  On-going      OT SHORT TERM GOAL #7   Status  --   inconsistent.  01/09/18 met at approx this level       OT Long Term Goals - 01/07/19 2211      OT LONG TERM GOAL #1   Title  Pt and family willl be mod I with aquatic HEP (for use when pt has access to pool) -03/04/2019 (renewed)    Status  On-going      OT LONG TERM GOAL #2   Title  Pt will demonstrate improved postural alignment and control in standing as demonstrated by ability to do light home mgmt tasks in standing (such as fold laundry)  Status  On-going      OT LONG TERM GOAL #3   Title  Pt will report greater ease in using LUE for simple home mgmt tasks (such as unloading dishwasher into [employer]) .     Status  On-going      OT LONG TERM GOAL #4   Title  Pt will be mod I using RW during simple home mgmt tasks at static standing level (i.e standing at sink to wash dishes).      Status  New            Plan - 01/07/19 2214    Clinical Impression Statement  Pt returns to aquatic therapy after being on hold due to scheduling conflicts.  Pt with slow progress toward goals.     Occupational Profile and client history currently impacting functional performance  Pt is a 43 y.o. female s/p subarachnoid hemorrhage due to ruptured aneurysm, L cerebellar infarct with ataxia.  Pt was working full time and indpendent prior to hospitalization.  Pt lives with husband and cared for 11 y.o. daughter.  Medical history includes HTN, GERD, and Bilateral retinal hemorhage associated with SAH (needed surgery).      Occupational performance deficits (Please refer to evaluation for details):  ADL's;IADL's;Leisure;Social Participation;Work    Neurosurgeon    Current Impairments/barriers affecting progress:  cognitive deficits, impulsivity    OT Frequency  1x / week    OT Duration  8 weeks    OT Treatment/Interventions  Self-care/ADL training;Therapeutic  exercise;Therapeutic activities;Cognitive remediation/compensation;Passive range of motion;Functional Mobility Training;Neuromuscular education;Energy conservation;Manual Therapy;Patient/family education;Balance training;Aquatic Therapy    Plan  aquatic therapy treatment relative to balance, postural alignment and control, functional ambulation, UE and LE strengthening    Consulted and Agree with Plan of Care  Patient;Family member/caregiver    Family Member Consulted  mother       Patient will benefit from skilled therapeutic intervention in order to improve the following deficits and impairments:  Decreased coordination, Decreased range of motion, Difficulty walking, Abnormal gait, Decreased safety awareness, Impaired sensation, Decreased knowledge of precautions, Decreased balance, Decreased knowledge of use of DME, Impaired UE functional use, Decreased cognition, Decreased mobility, Decreased strength, Impaired vision/preception, Impaired perceived functional ability  Visit Diagnosis: Other lack of coordination  Unsteadiness on feet  Ataxia  Abnormal posture  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 Trimalleolar fracture of ankle, closed, right, initial encounter 11/27/2018  . Tobacco use disorder 11/27/2018  . Osteoarthritis of right hip 10/07/2018  . 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 Weakness with dizziness-  since [facility] and CVA 04/07/17 08/07/2017  . Disturbances of vision, late effect of stroke 08/06/2017  . Health education/counseling 08/05/2017  . High risk medications (not anticoagulants) long-term use 08/05/2017  . Neuritis-  R sided:  arm and leg/ body due to stroke 08/05/2017  . Elevated LDL cholesterol level 07/11/2017  . [employer] [facility] (NIH) Stroke Scale limb ataxia score 2,  ataxia present in two limbs 06/25/2017  . 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 Elevated vitamin B12 level 05/30/2017  . Vitamin D deficiency 05/29/2017  . History of tobacco abuse-  30pk yr hx - quit 04/07/17 05/21/2017  . Gait disturbance, post-stroke 05/14/2017  . Benign essential HTN   . Vitreous hemorrhage of right eye ([place])   .  Adjustment disorder with mixed anxiety and depressed mood   . 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 s/p SAH (subarachnoid hemorrhage) ([place]) 04/19/2017  . 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 Hypoxia   . Subarachnoid hemorrhage due to ruptured aneurysm ([place]) 04/07/2017  . CVA (cerebral vascular accident) ([place]) 04/07/2017  . Elevated gastrin level 01/25/2012  . Hypokalemia 01/21/2012  . Nausea & vomiting 01/20/2012  . Epigastric pain 01/20/2012  . Duodenal ulcer, acute with obstruction 10/17/2011  . S/P laparoscopic cholecystectomy 10/16/2011    Quay Burow, OTR/L 01/07/2019, 10:16 PM  [facility] [address] [place], [place], [address] Phone: [phone]   Fax:  [phone]  Name: JEMIMAH CRESSY MRN: [id number] Date of Birth: 1976/09/29

## 2019-01-08 ENCOUNTER — Ambulatory Visit: Payer: BLUE CROSS/BLUE SHIELD | Admitting: Physical Therapy

## 2019-01-08 VITALS — BP 126/77

## 2019-01-08 DIAGNOSIS — M6281 Muscle weakness (generalized): Secondary | ICD-10-CM

## 2019-01-08 DIAGNOSIS — R260 Ataxic gait: Secondary | ICD-10-CM | POA: Diagnosis not present

## 2019-01-08 DIAGNOSIS — R2689 Other abnormalities of gait and mobility: Secondary | ICD-10-CM

## 2019-01-08 DIAGNOSIS — R278 Other lack of coordination: Secondary | ICD-10-CM

## 2019-01-08 DIAGNOSIS — R2681 Unsteadiness on feet: Secondary | ICD-10-CM

## 2019-01-09 NOTE — Therapy (Signed)
[facility] [address] [place], [place], [address] Phone: [phone]   Fax:  [phone]  Physical Therapy Treatment  Patient Details  Name: [name] Barnett MRN: [id number] Date of Birth: 14-Feb-1976 Referring Provider (PT): Edmonia Lynch, MD   Encounter Date: 01/08/2019  PT End of Session - 01/09/19 1154    Visit Number  27    Number of Visits  43   renewal completed on 01-09-19 for 16 additional visits   Date for PT Re-Evaluation  03/10/19   per renewal of 8 additional wks   Authorization Type  [employer]    PT Start Time  4097    PT Stop Time  1624    PT Time Calculation (min)  49 min    Equipment Utilized During Treatment  Gait belt    Activity Tolerance  Patient tolerated treatment well    Behavior During Therapy  WFL for tasks assessed/performed       Past Medical History:  Diagnosis Date  . Duodenal obstruction   . GERD (gastroesophageal reflux disease)   . Hypertension   . Stroke [facility])    april 2018, lt sided weakness  . Trimalleolar fracture of ankle, closed, right, initial encounter     Past Surgical History:  Procedure Laterality Date  . BALLOON DILATION  12/31/2011   Procedure: BALLOON DILATION;  Surgeon: Missy Sabins, MD;  Location: [facility] ENDOSCOPY;  Service: Endoscopy;  Laterality: N/A;  . CHOLECYSTECTOMY  07/28/11  . ESOPHAGOGASTRODUODENOSCOPY  10/17/2011   Procedure: ESOPHAGOGASTRODUODENOSCOPY (EGD);  Surgeon: Missy Sabins, MD;  Location: [facility] ENDOSCOPY;  Service: Endoscopy;  Laterality: N/A;  . IR ANGIO INTRA EXTRACRAN SEL INTERNAL CAROTID BILAT MOD SED  04/07/2017  . IR ANGIO INTRA EXTRACRAN SEL INTERNAL CAROTID BILAT MOD SED  01/07/2018  . IR ANGIO VERTEBRAL SEL VERTEBRAL UNI R MOD SED  04/07/2017  . IR ANGIO VERTEBRAL SEL VERTEBRAL UNI R MOD SED  01/07/2018  . IR ANGIOGRAM FOLLOW UP STUDY  04/07/2017  . IR ANGIOGRAM FOLLOW UP STUDY  04/07/2017  . IR ANGIOGRAM FOLLOW UP STUDY  04/07/2017  . IR ANGIOGRAM  FOLLOW UP STUDY  04/07/2017  . IR ANGIOGRAM FOLLOW UP STUDY  04/07/2017  . IR ANGIOGRAM SELECTIVE EACH ADDITIONAL VESSEL  04/07/2017  . IR TRANSCATH/EMBOLIZ  04/07/2017  . IR US GUIDE VASC ACCESS RIGHT  01/07/2018  . ORIF ANKLE FRACTURE Right 07/01/2018   Procedure: RIGHT ANKLE FRACTURE OPEN TREATMENT TRIMALLEOLAR INCLUDES INTERNAL FIXATION WITHOUT FIXATION OF POSTERIOR LIP, FRACTURE CLOSED TREATMENT TRIMALLEOLAR ANKLE WITH MANIPULATION, REPAIR SYNDESMOSIS, REPAIR DELTOID LIGAMENT;  Surgeon: Renette Butters, MD;  Location: [facility];  Service: Orthopedics;  Laterality: Right;  . PARS PLANA VITRECTOMY Right 05/01/2017   Procedure: PARS PLANA VITRECTOMY WITH 25 GAUGE RIGHT EYE, endolaser photocoaglation;  Surgeon: Jalene Mullet, MD;  Location: [facility];  Service: Ophthalmology;  Laterality: Right;  . RADIOLOGY WITH ANESTHESIA N/A 04/07/2017   Procedure: RADIOLOGY WITH ANESTHESIA;  Surgeon: Consuella Lose, MD;  Location: [facility];  Service: Radiology;  Laterality: N/A;  . REPLACEMENT TOTAL KNEE BILATERAL  2004  . TEMPOROMANDIBULAR JOINT SURGERY     2 surgeries  . TUMOR REMOVAL    . VAGOTOMY  01/23/2012   Procedure: VAGOTOMY, antrectomy and BII;  Surgeon: Haywood Lasso, MD;  Location: [facility];  Service: General;  Laterality: N/A;  Laparotomy with vagotomy.    Vitals:   01/08/19 1536  BP: 126/77    Subjective Assessment - 01/09/19 1148  Subjective  Pt states she got her rollator but does not have a weight (a 10# weight has been used in clinic) to put in it - has not used it much yet    Patient is accompained by:  Family member   mother and daughter   Pertinent History  Ataxia post stroke (April 2018), Duodenal obstruction, HTN, GERD    Patient Stated Goals  Pt reports goal of walking with no AD and improve balance to reduce fall risk    Currently in Pain?  No/denies                       [facility] Adult PT Treatment/Exercise - 01/09/19 0001      Transfers    Transfers  Sit to Stand    Sit to Stand  5: Supervision    Sit to Stand Details  Verbal cues for precautions/safety;Verbal cues for safe use of DME/AE    Stand to Sit Details  UE's used for controlled descent on mat with stand to sit    Floor to Transfer  5: Supervision    Number of Reps  Other reps (comment)   5     Ambulation/Gait   Ambulation/Gait  Yes    Ambulation/Gait Assistance  4: Min guard    Ambulation/Gait Assistance Details  10# weight was placed in basket of rollator for increased stability     Ambulation Distance (Feet)  500 Feet    Assistive device  Rollator    Gait Pattern  Step-through pattern;Ataxic;Trunk flexed    Ambulation Surface  Level;Unlevel;Indoor;Outdoor;Paved    Curb  4: Min assist   outside - with rollator with descension only   Gait Comments  pt became fatigued after amb. approx. 400' outdoors with rollator on paved surface       NeuroRe-ed;  Tall kneeling activities to improve core stabilization/trunk control: pt performed moving each leg forward/back and out/in 5 reps  Each leg each direction with UE support on mat prn Squats back toward heels in tall kneeling - 10 reps - with AROM as tolerated  Pt amb. Forward and backward on blue mat on floor 2 reps with UE support on mat prn (in tall kneeling position);  Pt amb. In tall kneeling sideways  2 reps on mat on floor with CGA to min assist with UE support prn Pt performed Lt hip extension in quadruped position 10 reps with min assist for stabilization in quadruped position       PT Short Term Goals - 01/09/19 1204      PT SHORT TERM GOAL #1   Title  Pt will report independence and compliance with land HEP;  continue participation in aquatic therapy with aquatic HEP to be established    Time  4    Period  Weeks    Status  New    Target Date  02/07/19      PT SHORT TERM GOAL #2   Title  Pt will improve gait speed to >/= 2.3 ft/esc indicative of community ambulation with rollator    Baseline   2.17 ft/sec with rollator on 01-05-19:    Time  4    Period  Weeks    Status  New    Target Date  02/07/19      PT SHORT TERM GOAL #3   Title  Pt will improve Berg score to >/= 45/56 indicating decreased fall risk potential     Baseline  43/56 on 01-05-19  Time  4    Period  Weeks    Status  New    Target Date  02/07/19      PT SHORT TERM GOAL #4   Title  Pt will improve TUG score to >/= 17 seconds indicating decreased risk for falls and improvement in functional mobility using rollator    Baseline  19.91 secs on 01-05-19    Time  4    Period  Weeks    Status  New    Target Date  02/07/19        PT Long Term Goals - 01/08/19 1527      PT LONG TERM GOAL #1   Title  Pt will demonstrate independence with land and aquatic HEP    Baseline  Land HEP is established; aquatic HEP is on-going as pt is currently participating in aquatic therapy with OT    Time  8    Period  Weeks    Status  On-going    Target Date  03/10/19      PT LONG TERM GOAL #2   Title  Pt will improve Berg score to >/= 47/56 to decrease fall risk potential - CONTINUE this LTG    Baseline  43/56 on 01-05-19    Time  8    Period  Weeks    Status  Partially Met    Target Date  03/10/19      PT LONG TERM GOAL #3   Title  Pt will improve gait speed to >/=2.5 ft/sec indicating significant change in functional mobility with LRAD: REVISED 01-08-19:  Improve gait velocity to >/= 2.4 ft/sec with use of rollator for incr. gati efficiency.    Baseline  1.9 with RW, 2.17 with rollator    Time  8    Period  Weeks    Status  Not Met   REVISED   Target Date  03/10/19      PT LONG TERM GOAL #4   Title  Pt will improve TUG score to </= 13.5 seconds using LRAD indicating decreased fall risk potential ; REVISED to </= 15 secs with rollator     Baseline  23.37 with rolling walker, 19.91 with rollator    Time  8    Status  Not Met   REVISED   Target Date  03/10/19      PT LONG TERM GOAL #5   Title  Pt will negotiate 12  steps using B HR's and alternating stepping technique with supervision to improve community accessibility     Baseline  met 11-17-18    Status  Achieved      PT LONG TERM GOAL #6   Title  Pt will report ability to perform household ambulation using her RW with modified independence demonstrating significant improvement in functional mobility     Baseline  met 01-08-19; pt now has obtained rollator for assistance with ambulation    Status  Achieved      PT LONG TERM GOAL #7   Title  Pt will ambulate outdoors over paved surfaces and perform curbs and inclines with supervision using LRAD (ROLLATOR) for 300 feet to improve functional mobility with community distances     Time  8    Period  Weeks    Status  On-going    Target Date  03/10/19      PT LONG TERM GOAL #8   Title  Pt will ambulate over level surfaces with her RW for 230 feet with supervision  using LRAD demonstrating improvement in functional mobility     Status  Achieved            Plan - 01/09/19 1327    Clinical Impression Statement  Pt has met LTG #6 and partially met LTG #7:  pt has just started using rollator for gait training in the clinic and has just recently obtained one for use at home but has not started using it for household amb. at this time.  Pt needs weight added to rollator for stability with a 10# weight used in the clinic.  Pt fatigued after amb. 400' outdoors with use of rollator on paved even and uneven surfaces.  Pt reporting light headedness at today's PT session.  Recertification to be completed to address deficits of decreased balance, gait and coordination.    Rehab Potential  Good    Clinical Impairments Affecting Rehab Potential  high risk for falls due to abnormal gait, ataxia, sensory changes, and impaired cognition.     PT Frequency  2x / week    PT Duration  8 weeks    PT Treatment/Interventions  ADLs/Self Care Home Management;Aquatic Therapy;DME Instruction;Gait training;Stair training;Functional  mobility training;Therapeutic activities;Therapeutic exercise;Balance training;Neuromuscular re-education;Patient/family education;Cognitive remediation;Orthotic Fit/Training;Electrical Stimulation;Cryotherapy;Moist Heat;Manual techniques;Passive range of motion;Taping;Energy conservation;Dry needling;Vestibular    PT Next Visit Plan  Need to update HEP and focus on turning/pivoting/rotation, SLS/taps, VOR.  Gait training with rollator (10 lb in basket).  TDN to lumbar multifidus/quad/TFL/adductor/hip flexor; proximal stability and core activation in sidelying/supine (pilates?), tall kneeling (trunk stability during UE movement/resistance with theraband) and standing, focus on decreased use of lumbar extensors during gait, cont to work on balance, gait and trunk stabilization activities     PT Home Exercise Plan  Access Code: [email or web address]     Consulted and Agree with Plan of Care  Patient;Family member/caregiver    Family Member Consulted  mother       Patient will benefit from skilled therapeutic intervention in order to improve the following deficits and impairments:  Abnormal gait, Decreased balance, Decreased cognition, Decreased coordination, Decreased strength, Difficulty walking, Impaired sensation, Pain, Decreased activity tolerance, Decreased mobility, Decreased endurance, Postural dysfunction, Decreased knowledge of use of DME, Impaired perceived functional ability, Decreased range of motion, Impaired flexibility, Dizziness  Visit Diagnosis: Other lack of coordination - Plan: PT plan of care cert/re-cert  Unsteadiness on feet - Plan: PT plan of care cert/re-cert  Other abnormalities of gait and mobility - Plan: PT plan of care cert/re-cert  Muscle weakness (generalized) - Plan: PT plan of care cert/re-cert     Problem List Patient Active Problem List   Diagnosis Date Noted  . Trimalleolar fracture of ankle, closed, right, initial encounter 11/27/2018  . Tobacco use disorder 11/27/2018   . Osteoarthritis of right hip 10/07/2018  . 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 Weakness with dizziness-  since [facility] and CVA 04/07/17 08/07/2017  . Disturbances of vision, late effect of stroke 08/06/2017  . Health education/counseling 08/05/2017  . High risk medications (not anticoagulants) long-term use 08/05/2017  . Neuritis-  R sided:  arm and leg/ body due to stroke 08/05/2017  . Elevated LDL cholesterol level 07/11/2017  . [employer] [facility] (NIH) Stroke Scale limb ataxia score 2, ataxia present in two limbs 06/25/2017  . 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 Elevated vitamin B12 level 05/30/2017  . Vitamin D deficiency 05/29/2017  . History of tobacco abuse-  30pk yr hx - quit 04/07/17 05/21/2017  .  Gait disturbance, post-stroke 05/14/2017  . Benign essential HTN   . Vitreous hemorrhage of right eye ([place])   . Adjustment disorder with mixed anxiety and depressed mood   . 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 s/p SAH (subarachnoid hemorrhage) ([place]) 04/19/2017  . 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 Hypoxia   . Subarachnoid hemorrhage due to ruptured aneurysm ([place]) 04/07/2017  . CVA (cerebral vascular accident) ([place]) 04/07/2017  . Elevated gastrin level 01/25/2012  . Hypokalemia 01/21/2012  . Nausea & vomiting 01/20/2012  . Epigastric pain 01/20/2012  . Duodenal ulcer, acute with obstruction 10/17/2011  . S/P laparoscopic cholecystectomy 10/16/2011    Alda Lea, PT 01/09/2019, 1:48 PM  [facility] [address] [place], [place], [address] Phone: [phone]   Fax:  [phone]  Name: [name] Barnett MRN: [id number] Date of Birth: 1976/03/06

## 2019-01-13 ENCOUNTER — Ambulatory Visit: Payer: BLUE CROSS/BLUE SHIELD | Attending: Orthopedic Surgery | Admitting: Physical Therapy

## 2019-01-13 DIAGNOSIS — R208 Other disturbances of skin sensation: Secondary | ICD-10-CM | POA: Insufficient documentation

## 2019-01-13 DIAGNOSIS — R260 Ataxic gait: Secondary | ICD-10-CM | POA: Insufficient documentation

## 2019-01-13 DIAGNOSIS — R2681 Unsteadiness on feet: Secondary | ICD-10-CM | POA: Diagnosis present

## 2019-01-13 DIAGNOSIS — R2689 Other abnormalities of gait and mobility: Secondary | ICD-10-CM | POA: Insufficient documentation

## 2019-01-13 DIAGNOSIS — R42 Dizziness and giddiness: Secondary | ICD-10-CM | POA: Diagnosis present

## 2019-01-13 DIAGNOSIS — M79651 Pain in right thigh: Secondary | ICD-10-CM | POA: Diagnosis present

## 2019-01-13 DIAGNOSIS — R270 Ataxia, unspecified: Secondary | ICD-10-CM | POA: Diagnosis present

## 2019-01-13 DIAGNOSIS — R278 Other lack of coordination: Secondary | ICD-10-CM | POA: Insufficient documentation

## 2019-01-13 DIAGNOSIS — R293 Abnormal posture: Secondary | ICD-10-CM | POA: Insufficient documentation

## 2019-01-13 DIAGNOSIS — I69054 Hemiplegia and hemiparesis following nontraumatic subarachnoid hemorrhage affecting left non-dominant side: Secondary | ICD-10-CM | POA: Diagnosis present

## 2019-01-13 DIAGNOSIS — M6281 Muscle weakness (generalized): Secondary | ICD-10-CM | POA: Diagnosis present

## 2019-01-13 NOTE — Therapy (Signed)
[facility] [address] [place], [place], [address] Phone: [phone]   Fax:  [phone]  Physical Therapy Treatment  Patient Details  Name: [name] Barnett MRN: [id number] Date of Birth: February 19, 1976 Referring Provider (PT): Edmonia Lynch, MD   Encounter Date: 01/13/2019  PT End of Session - 01/13/19 2202    Visit Number  28    Number of Visits  43    Date for PT Re-Evaluation  03/10/19    Authorization Type  BCBS    PT Start Time  1530    PT Stop Time  1615    PT Time Calculation (min)  45 min    Equipment Utilized During Treatment  Gait belt    Activity Tolerance  Patient tolerated treatment well    Behavior During Therapy  [facility] for tasks assessed/performed       Past Medical History:  Diagnosis Date  . Duodenal obstruction   . GERD (gastroesophageal reflux disease)   . Hypertension   . Stroke [facility])    april 2018, lt sided weakness  . Trimalleolar fracture of ankle, closed, right, initial encounter     Past Surgical History:  Procedure Laterality Date  . BALLOON DILATION  12/31/2011   Procedure: BALLOON DILATION;  Surgeon: Missy Sabins, MD;  Location: [facility] ENDOSCOPY;  Service: Endoscopy;  Laterality: N/A;  . CHOLECYSTECTOMY  07/28/11  . ESOPHAGOGASTRODUODENOSCOPY  10/17/2011   Procedure: ESOPHAGOGASTRODUODENOSCOPY (EGD);  Surgeon: Missy Sabins, MD;  Location: [facility] ENDOSCOPY;  Service: Endoscopy;  Laterality: N/A;  . IR ANGIO INTRA EXTRACRAN SEL INTERNAL CAROTID BILAT MOD SED  04/07/2017  . IR ANGIO INTRA EXTRACRAN SEL INTERNAL CAROTID BILAT MOD SED  01/07/2018  . IR ANGIO VERTEBRAL SEL VERTEBRAL UNI R MOD SED  04/07/2017  . IR ANGIO VERTEBRAL SEL VERTEBRAL UNI R MOD SED  01/07/2018  . IR ANGIOGRAM FOLLOW UP STUDY  04/07/2017  . IR ANGIOGRAM FOLLOW UP STUDY  04/07/2017  . IR ANGIOGRAM FOLLOW UP STUDY  04/07/2017  . IR ANGIOGRAM FOLLOW UP STUDY  04/07/2017  . IR ANGIOGRAM FOLLOW UP STUDY  04/07/2017  . IR ANGIOGRAM  SELECTIVE EACH ADDITIONAL VESSEL  04/07/2017  . IR TRANSCATH/EMBOLIZ  04/07/2017  . IR US GUIDE VASC ACCESS RIGHT  01/07/2018  . ORIF ANKLE FRACTURE Right 07/01/2018   Procedure: RIGHT ANKLE FRACTURE OPEN TREATMENT TRIMALLEOLAR INCLUDES INTERNAL FIXATION WITHOUT FIXATION OF POSTERIOR LIP, FRACTURE CLOSED TREATMENT TRIMALLEOLAR ANKLE WITH MANIPULATION, REPAIR SYNDESMOSIS, REPAIR DELTOID LIGAMENT;  Surgeon: Renette Butters, MD;  Location: [facility];  Service: Orthopedics;  Laterality: Right;  . PARS PLANA VITRECTOMY Right 05/01/2017   Procedure: PARS PLANA VITRECTOMY WITH 25 GAUGE RIGHT EYE, endolaser photocoaglation;  Surgeon: Jalene Mullet, MD;  Location: [place];  Service: Ophthalmology;  Laterality: Right;  . RADIOLOGY WITH ANESTHESIA N/A 04/07/2017   Procedure: RADIOLOGY WITH ANESTHESIA;  Surgeon: Consuella Lose, MD;  Location: [place];  Service: Radiology;  Laterality: N/A;  . REPLACEMENT TOTAL KNEE BILATERAL  2004  . TEMPOROMANDIBULAR JOINT SURGERY     2 surgeries  . TUMOR REMOVAL    . VAGOTOMY  01/23/2012   Procedure: VAGOTOMY, antrectomy and BII;  Surgeon: Haywood Lasso, MD;  Location: [facility];  Service: General;  Laterality: N/A;  Laparotomy with vagotomy.    There were no vitals filed for this visit.  Subjective Assessment - 01/13/19 2158    Subjective  Pt relays some groin pain but this is normal for her, she relays  she got a 10# dumbell wt for her rollator at home but it does not feel as heavy or sturdy as the one in clinic.     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  Yes    Pain Score  7     Pain Location  Groin    Pain Descriptors / Indicators  Aching      Therex, Neuro rehab, and Gait training performed today: ambulation with Rollator with 10 lb sand bag 3 laps, cues for  turning and keeping RW closer to her body sit to stand no UE support X 10 supervision to min A for balance //bars march walking, retrowalk, sidestepping, FT eyes open balance, Feet apart eyes closed balance, heel toe raises X 20 all supervision to min A  PT Short Term Goals - 01/09/19 1204      PT SHORT TERM GOAL #1   Title  Pt will report independence and compliance with land HEP;  continue participation in aquatic therapy with aquatic HEP to be established    Time  4    Period  Weeks    Status  New    Target Date  02/07/19      PT SHORT TERM GOAL #2   Title  Pt will improve gait speed to >/= 2.3 ft/esc indicative of community ambulation with rollator    Baseline  2.17 ft/sec with rollator on 01-05-19:    Time  4    Period  Weeks    Status  New    Target Date  02/07/19      PT SHORT TERM GOAL #3   Title  Pt will improve Berg score to >/= 45/56 indicating decreased fall risk potential     Baseline  43/56 on 01-05-19    Time  4    Period  Weeks    Status  New    Target Date  02/07/19      PT SHORT TERM GOAL #4   Title  Pt will improve TUG score to >/= 17 seconds indicating decreased risk for falls and improvement in functional mobility using rollator    Baseline  19.91 secs on 01-05-19    Time  4    Period  Weeks    Status  New    Target Date  02/07/19        PT Long Term Goals - 01/08/19 1527      PT LONG TERM GOAL #1   Title  Pt will demonstrate independence with land and aquatic HEP    Baseline  Land HEP is established; aquatic HEP is on-going as pt is currently participating in aquatic therapy with OT    Time  8    Period  Weeks    Status  On-going    Target Date  03/10/19      PT LONG TERM GOAL #2   Title  Pt will improve Berg score to >/= 47/56 to decrease fall risk potential - CONTINUE this LTG    Baseline  43/56 on 01-05-19    Time  8    Period  Weeks    Status  Partially Met    Target Date  03/10/19      PT LONG TERM GOAL #3   Title  Pt will improve gait  speed to >/=2.5  ft/sec indicating significant change in functional mobility with LRAD: REVISED 01-08-19:  Improve gait velocity to >/= 2.4 ft/sec with use of rollator for incr. gati efficiency.    Baseline  1.9 with RW, 2.17 with rollator    Time  8    Period  Weeks    Status  Not Met   REVISED   Target Date  03/10/19      PT LONG TERM GOAL #4   Title  Pt will improve TUG score to </= 13.5 seconds using LRAD indicating decreased fall risk potential ; REVISED to </= 15 secs with rollator     Baseline  23.37 with rolling walker, 19.91 with rollator    Time  8    Status  Not Met   REVISED   Target Date  03/10/19      PT LONG TERM GOAL #5   Title  Pt will negotiate 12 steps using B HR's and alternating stepping technique with supervision to improve community accessibility     Baseline  met 11-17-18    Status  Achieved      PT LONG TERM GOAL #6   Title  Pt will report ability to perform household ambulation using her RW with modified independence demonstrating significant improvement in functional mobility     Baseline  met 01-08-19; pt now has obtained rollator for assistance with ambulation    Status  Achieved      PT LONG TERM GOAL #7   Title  Pt will ambulate outdoors over paved surfaces and perform curbs and inclines with supervision using LRAD (ROLLATOR) for 300 feet to improve functional mobility with community distances     Time  8    Period  Weeks    Status  On-going    Target Date  03/10/19      PT LONG TERM GOAL #8   Title  Pt will ambulate over level surfaces with her RW for 230 feet with supervision using LRAD demonstrating improvement in functional mobility     Status  Achieved            Plan - 01/13/19 2202    Clinical Impression Statement  Session focused on gait, balance, and LE strengthening today. She was overall supervision with ambulation with rollator with 10# wt to add stability. She was supervision to min A with balance activites today. She was able to  perfrom some sit to stands without UE support with good balance but at other times needed min A for balance anteriorly. PT will continue to progress as able.     Rehab Potential  Good    Clinical Impairments Affecting Rehab Potential  high risk for falls due to abnormal gait, ataxia, sensory changes, and impaired cognition.     PT Frequency  2x / week    PT Duration  8 weeks    PT Treatment/Interventions  ADLs/Self Care Home Management;Aquatic Therapy;DME Instruction;Gait training;Stair training;Functional mobility training;Therapeutic activities;Therapeutic exercise;Balance training;Neuromuscular re-education;Patient/family education;Cognitive remediation;Orthotic Fit/Training;Electrical Stimulation;Cryotherapy;Moist Heat;Manual techniques;Passive range of motion;Taping;Energy conservation;Dry needling;Vestibular    PT Next Visit Plan  Need to update HEP and focus on turning/pivoting/rotation, SLS/taps, VOR.  Gait training with rollator (10 lb in basket).  TDN to lumbar multifidus/quad/TFL/adductor/hip flexor; proximal stability and core activation in sidelying/supine (pilates?), tall kneeling (trunk stability during UE movement/resistance with theraband) and standing, focus on decreased use of lumbar extensors during gait, cont to work on balance, gait and trunk stabilization activities     PT Home Exercise Plan  Access  Code: [email or web address]     Consulted and Agree with Plan of Care  Patient;Family member/caregiver    Family Member Consulted  husband       Patient will benefit from skilled therapeutic intervention in order to improve the following deficits and impairments:  Abnormal gait, Decreased balance, Decreased cognition, Decreased coordination, Decreased strength, Difficulty walking, Impaired sensation, Pain, Decreased activity tolerance, Decreased mobility, Decreased endurance, Postural dysfunction, Decreased knowledge of use of DME, Impaired perceived functional ability, Decreased range of motion,  Impaired flexibility, Dizziness  Visit Diagnosis: Other lack of coordination  Unsteadiness on feet  Other abnormalities of gait and mobility  Muscle weakness (generalized)     Problem List Patient Active Problem List   Diagnosis Date Noted  . Trimalleolar fracture of ankle, closed, right, initial encounter 11/27/2018  . Tobacco use disorder 11/27/2018  . Osteoarthritis of right hip 10/07/2018  . 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 Weakness with dizziness-  since [facility] and CVA 04/07/17 08/07/2017  . Disturbances of vision, late effect of stroke 08/06/2017  . Health education/counseling 08/05/2017  . High risk medications (not anticoagulants) long-term use 08/05/2017  . Neuritis-  R sided:  arm and leg/ body due to stroke 08/05/2017  . Elevated LDL cholesterol level 07/11/2017  . [employer] (NIH) Stroke Scale limb ataxia score 2, ataxia present in two limbs 06/25/2017  . 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 Elevated vitamin B12 level 05/30/2017  . Vitamin D deficiency 05/29/2017  . History of tobacco abuse-  30pk yr hx - quit 04/07/17 05/21/2017  . Gait disturbance, post-stroke 05/14/2017  . Benign essential HTN   . Vitreous hemorrhage of right eye ([place])   . Adjustment disorder with mixed anxiety and depressed mood   . 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 s/p SAH (subarachnoid hemorrhage) ([place]) 04/19/2017  . 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 Hypoxia   . Subarachnoid hemorrhage due to ruptured aneurysm ([place]) 04/07/2017  . CVA (cerebral vascular accident) ([place]) 04/07/2017  . Elevated gastrin level 01/25/2012  . Hypokalemia 01/21/2012  . Nausea & vomiting 01/20/2012  . Epigastric pain 01/20/2012  . Duodenal ulcer, acute with obstruction 10/17/2011  . S/P laparoscopic cholecystectomy 10/16/2011    Silvestre Mesi 01/13/2019, 10:08 PM  [facility] [address] [place], [place], [address] Phone: [phone]   Fax:  [phone]  Name: RAYVN RICKERSON MRN: [id number] Date of Birth: [birth year]

## 2019-01-14 ENCOUNTER — Encounter: Payer: Self-pay | Admitting: Occupational Therapy

## 2019-01-14 ENCOUNTER — Ambulatory Visit: Payer: BLUE CROSS/BLUE SHIELD | Admitting: Occupational Therapy

## 2019-01-14 DIAGNOSIS — R270 Ataxia, unspecified: Secondary | ICD-10-CM

## 2019-01-14 DIAGNOSIS — R2681 Unsteadiness on feet: Secondary | ICD-10-CM

## 2019-01-14 DIAGNOSIS — R278 Other lack of coordination: Secondary | ICD-10-CM | POA: Diagnosis not present

## 2019-01-14 DIAGNOSIS — M6281 Muscle weakness (generalized): Secondary | ICD-10-CM

## 2019-01-14 DIAGNOSIS — R208 Other disturbances of skin sensation: Secondary | ICD-10-CM

## 2019-01-14 DIAGNOSIS — R293 Abnormal posture: Secondary | ICD-10-CM

## 2019-01-14 DIAGNOSIS — I69054 Hemiplegia and hemiparesis following nontraumatic subarachnoid hemorrhage affecting left non-dominant side: Secondary | ICD-10-CM

## 2019-01-14 NOTE — Therapy (Signed)
[facility] [address] [place], [place], [address] Phone: [phone]   Fax:  [phone]  Occupational Therapy Treatment  Patient Details  Name: [name] Barnett MRN: [id number] Date of Birth: July 23, 1976 Referring Provider (OT): Dr. Claudette Laws   Encounter Date: 01/14/2019  OT End of Session - 01/14/19 2200    Visit Number  53    Number of Visits  60    Date for OT Re-Evaluation  03/04/19    Authorization Type  BCBS, no visit limit/auth req.    OT Start Time  1501    OT Stop Time  1544    OT Time Calculation (min)  43 min    Activity Tolerance  Patient tolerated treatment well       Past Medical History:  Diagnosis Date  . Duodenal obstruction   . GERD (gastroesophageal reflux disease)   . Hypertension   . Stroke [facility])    april 2018, lt sided weakness  . Trimalleolar fracture of ankle, closed, right, initial encounter     Past Surgical History:  Procedure Laterality Date  . BALLOON DILATION  12/31/2011   Procedure: BALLOON DILATION;  Surgeon: Barrie Folk, MD;  Location: [facility] ENDOSCOPY;  Service: Endoscopy;  Laterality: N/A;  . CHOLECYSTECTOMY  07/28/11  . ESOPHAGOGASTRODUODENOSCOPY  10/17/2011   Procedure: ESOPHAGOGASTRODUODENOSCOPY (EGD);  Surgeon: Barrie Folk, MD;  Location: [facility] ENDOSCOPY;  Service: Endoscopy;  Laterality: N/A;  . IR ANGIO INTRA EXTRACRAN SEL INTERNAL CAROTID BILAT MOD SED  04/07/2017  . IR ANGIO INTRA EXTRACRAN SEL INTERNAL CAROTID BILAT MOD SED  01/07/2018  . IR ANGIO VERTEBRAL SEL VERTEBRAL UNI R MOD SED  04/07/2017  . IR ANGIO VERTEBRAL SEL VERTEBRAL UNI R MOD SED  01/07/2018  . IR ANGIOGRAM FOLLOW UP STUDY  04/07/2017  . IR ANGIOGRAM FOLLOW UP STUDY  04/07/2017  . IR ANGIOGRAM FOLLOW UP STUDY  04/07/2017  . IR ANGIOGRAM FOLLOW UP STUDY  04/07/2017  . IR ANGIOGRAM FOLLOW UP STUDY  04/07/2017  . IR ANGIOGRAM SELECTIVE EACH ADDITIONAL VESSEL  04/07/2017  . IR TRANSCATH/EMBOLIZ  04/07/2017  .  IR US GUIDE VASC ACCESS RIGHT  01/07/2018  . ORIF ANKLE FRACTURE Right 07/01/2018   Procedure: RIGHT ANKLE FRACTURE OPEN TREATMENT TRIMALLEOLAR INCLUDES INTERNAL FIXATION WITHOUT FIXATION OF POSTERIOR LIP, FRACTURE CLOSED TREATMENT TRIMALLEOLAR ANKLE WITH MANIPULATION, REPAIR SYNDESMOSIS, REPAIR DELTOID LIGAMENT;  Surgeon: Sheral Apley, MD;  Location: [facility];  Service: Orthopedics;  Laterality: Right;  . PARS PLANA VITRECTOMY Right 05/01/2017   Procedure: PARS PLANA VITRECTOMY WITH 25 GAUGE RIGHT EYE, endolaser photocoaglation;  Surgeon: Carmela Rima, MD;  Location: [facility] OR;  Service: Ophthalmology;  Laterality: Right;  . RADIOLOGY WITH ANESTHESIA N/A 04/07/2017   Procedure: RADIOLOGY WITH ANESTHESIA;  Surgeon: Lisbeth Renshaw, MD;  Location: [facility] OR;  Service: Radiology;  Laterality: N/A;  . REPLACEMENT TOTAL KNEE BILATERAL  2004  . TEMPOROMANDIBULAR JOINT SURGERY     2 surgeries  . TUMOR REMOVAL    . VAGOTOMY  01/23/2012   Procedure: VAGOTOMY, antrectomy and BII;  Surgeon: Currie Paris, MD;  Location: [facility] OR;  Service: General;  Laterality: N/A;  Laparotomy with vagotomy.    There were no vitals filed for this visit.  Subjective Assessment - 01/14/19 2159    Subjective   The pool helps by balance.    Patient is accompained by:  Family member   mom   Pertinent History  Subarachnoid hemorrhage due to ruptured aneurysm,  ataxia (L cerebellar); HTN, R side decr sensation, vertical diplopia/visual deficits, ataxia, Bilateral retinal hemorhage associated with SAH (with surgery on R eye in hospital and L approx 1 month ago)    Limitations  visual deficits, fall risk, impulsivity/cognitive deficits    Patient Stated Goals  I want to be able to walk without assistance so I can do all the things I need to do as a wife and mom, use my hand better.     Currently in Pain?  No/denies          Patient seen for aquatic therapy today.  Treatment took place in water 2.5-4 feet  deep depending upon activity.  Pt entered the pool via steps doing 1 step at a time and using both hand rails with contact guard and min cues for postural control and for focused attention.  Using water "walker" addressed functional ambulation for pool length initially with min - mod assist however with practice and as pt's attention improved pt able to complete with min a only.  Addressed turns both L and R - pt able to turn R with supervision and cues only.  Pt initially min a with LOB when turning L however with practice able to complete with close supervision and max cues to slow movement down and control COG.  Addressed LE control and strengthening as well as core strength and postural alignment using single leg marching with noodle while facing pool wall (15 reps x2 each leg). Pt today able to maintain alignment with just cues.  Pt also able to control noodle with LE's with just cues.  Pt continues to need reminders to move slowly and to maintain BUE contact to create closed chain.  Addressed core strength and UE strength as well as loading UE and LE with use of modified pushups on stairs - pt needed cues for trunk alignment as well as stabilization of LE's with activity (10 reps x2).  Addressed forward and backward walking along pool wall with RUE on pool wall and LUE on therapist's arm to create closed link.  Pt needed min a initially however with cues to slow movement down and be intentional with stepping pt progressed to just close supervision and mod cues.  Addressed core strength and UE strength in sitting using moderate dumb bells submerged with UE's moving into and out of horizontal ab/adduction while maintaining body against upward thrust (buoyancy) - pt needed min stabilization of LE's  Progressed to functional ambulation using 2 large noodles for UE support and light steadying assist with mod cues for slow controlled movements.  Pt exited pool via steps using 2 hand rails, doing 1 step at a time  with only close supervision.                        OT Long Term Goals - 01/07/19 2211      OT LONG TERM GOAL #1   Title  Pt and family willl be mod I with aquatic HEP (for use when pt has access to pool) -03/04/2019 (renewed)    Status  On-going      OT LONG TERM GOAL #2   Title  Pt will demonstrate improved postural alignment and control in standing as demonstrated by ability to do light home mgmt tasks in standing (such as fold laundry)    Status  On-going      OT LONG TERM GOAL #3   Title  Pt will report greater ease in using  LUE for simple home mgmt tasks (such as unloading dishwasher into [employer] cabinet) .     Status  On-going      OT LONG TERM GOAL #4   Title  Pt will be mod I using RW during simple home mgmt tasks at static standing level (i.e standing at sink to wash dishes).      Status  On-going           Plan - 01/14/19 2159    Clinical Impression Statement  Pt with slow but steady progress toward goals. Pt with improving balance when pt is focused and calm    Occupational Profile and client history currently impacting functional performance  Pt is a 43 y.o. female s/p subarachnoid hemorrhage due to ruptured aneurysm, L cerebellar infarct with ataxia.  Pt was working full time and indpendent prior to hospitalization.  Pt lives with husband and cared for 44 y.o. daughter.  Medical history includes HTN, GERD, and Bilateral retinal hemorhage associated with SAH (needed surgery).      Occupational performance deficits (Please refer to evaluation for details):  ADL's;IADL's;Leisure;Social Participation;Work    Presenter, broadcastingehab Potential  Good    Current Impairments/barriers affecting progress:  cognitive deficits, impulsivity    OT Frequency  1x / week    OT Duration  8 weeks    OT Treatment/Interventions  Self-care/ADL training;Therapeutic exercise;Therapeutic activities;Cognitive remediation/compensation;Passive range of motion;Functional Mobility  Training;Neuromuscular education;Energy conservation;Manual Therapy;Patient/family education;Balance training;Aquatic Therapy    Plan  aquatic therapy treatment relative to balance, postural alignment and control, functional ambulation, UE and LE strengthening    Consulted and Agree with Plan of Care  Patient;Family member/caregiver    Family Member Consulted  mother       Patient will benefit from skilled therapeutic intervention in order to improve the following deficits and impairments:  Decreased coordination, Decreased range of motion, Difficulty walking, Abnormal gait, Decreased safety awareness, Impaired sensation, Decreased knowledge of precautions, Decreased balance, Decreased knowledge of use of DME, Impaired UE functional use, Decreased cognition, Decreased mobility, Decreased strength, Impaired vision/preception, Impaired perceived functional ability  Visit Diagnosis: Other lack of coordination  Unsteadiness on feet  Muscle weakness (generalized)  Ataxia  Abnormal posture  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 Trimalleolar fracture of ankle, closed, right, initial encounter 11/27/2018  . Tobacco use disorder 11/27/2018  . Osteoarthritis of right hip 10/07/2018  . 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 Weakness with dizziness-  [facility] 04/[facility]17 08/07/2017  . Disturbances of vision, late effect of stroke 08/06/2017  . Health education/counseling 08/05/2017  . High risk medications (not anticoagulants) long-term use 08/05/2017  . Neuritis-  R sided:  arm and leg/ body due to stroke 08/05/2017  . Elevated LDL cholesterol level 07/11/2017  . [employer] (NIH) Stroke Scale limb ataxia score 2, ataxia present in two limbs 06/25/2017  . Alteration of sensation as late  effect of stroke 06/25/2017  . Elevated vitamin B12 level 05/30/2017  . Vitamin D deficiency 05/29/2017  . History of tobacco abuse-  30pk yr hx - quit 04/07/17 05/21/2017  . Gait disturbance, post-stroke 05/14/2017  . Benign essential HTN   . Vitreous hemorrhage of right eye ([name])   . Adjustment disorder with mixed anxiety and depressed mood   . 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 Terson  syndrome of both eyes ([name]) 04/23/2017  . s/p SAH (subarachnoid hemorrhage) ([name]) 04/19/2017  . Basilar artery aneurysm ([name])   . Hypoxia   . 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 CVA (cerebral vascular accident) ([name]) 04/07/2017  . Elevated gastrin level 01/25/2012  . Hypokalemia 01/21/2012  . Nausea & vomiting 01/20/2012  . Epigastric pain 01/20/2012  . Duodenal ulcer, acute with obstruction 10/17/2011  . S/P laparoscopic cholecystectomy 10/16/2011    Norton Pastel, OTR/L 01/14/2019, 10:01 PM  [facility] [address] [place], [place], [address] Phone: [phone]   Fax:  [phone]  Name: CHARLENA RADWANSKI MRN: [id number] Date of Birth: 1976/02/10

## 2019-01-15 ENCOUNTER — Ambulatory Visit: Payer: BLUE CROSS/BLUE SHIELD | Admitting: Physical Therapy

## 2019-01-16 ENCOUNTER — Ambulatory Visit: Payer: BLUE CROSS/BLUE SHIELD | Admitting: Physical Therapy

## 2019-01-20 ENCOUNTER — Ambulatory Visit: Payer: BLUE CROSS/BLUE SHIELD | Admitting: Physical Therapy

## 2019-01-20 ENCOUNTER — Ambulatory Visit: Payer: BLUE CROSS/BLUE SHIELD | Admitting: Rehabilitation

## 2019-01-21 ENCOUNTER — Ambulatory Visit: Payer: BLUE CROSS/BLUE SHIELD | Admitting: Occupational Therapy

## 2019-01-22 ENCOUNTER — Ambulatory Visit: Payer: BLUE CROSS/BLUE SHIELD | Admitting: Rehabilitation

## 2019-01-22 ENCOUNTER — Ambulatory Visit: Payer: BLUE CROSS/BLUE SHIELD | Admitting: Physical Therapy

## 2019-01-29 ENCOUNTER — Encounter: Payer: Self-pay | Admitting: Physical Therapy

## 2019-01-29 ENCOUNTER — Ambulatory Visit: Payer: BLUE CROSS/BLUE SHIELD | Admitting: Physical Therapy

## 2019-01-29 DIAGNOSIS — R278 Other lack of coordination: Secondary | ICD-10-CM | POA: Diagnosis not present

## 2019-01-29 DIAGNOSIS — R260 Ataxic gait: Secondary | ICD-10-CM

## 2019-01-29 DIAGNOSIS — R2689 Other abnormalities of gait and mobility: Secondary | ICD-10-CM

## 2019-01-29 DIAGNOSIS — R2681 Unsteadiness on feet: Secondary | ICD-10-CM

## 2019-01-29 NOTE — Patient Instructions (Signed)
Gaze Stabilization: Sitting    Keeping eyes on target on wall 3 feet away, and move head side to side for _60___ seconds. Repeat while moving head up and down for _10 repetitions. Do __2-3__ sessions per day.  Copyright  VHI. All rights reserved.   Gaze Stabilization: Tip Card  1.Target must remain in focus, not blurry, and appear stationary while head is in motion. 2.Perform exercises with small head movements (45 to either side of midline). 3.Increase speed of head motion so long as target is in focus. 4.If you wear eyeglasses, be sure you can see target through lens (therapist will give specific instructions for bifocal / progressive lenses). 5.These exercises may provoke dizziness or nausea. Work through these symptoms. If too dizzy, slow head movement slightly. Rest between each exercise. 6.Exercises demand concentration; avoid distractions.  Copyright  VHI. All rights reserved.

## 2019-01-29 NOTE — Therapy (Signed)
[facility] [address] [place], [place], [address] Phone: [phone]   Fax:  [phone]  Physical Therapy Treatment  Patient Details  Name: [name] Barnett MRN: [id number] Date of Birth: 10-15-1976 Referring Provider (PT): Edmonia Lynch, MD   Encounter Date: 01/29/2019  PT End of Session - 01/29/19 2015    Visit Number  29    Number of Visits  43    Date for PT Re-Evaluation  03/10/19    Authorization Type  [employer]    PT Start Time  1445    PT Stop Time  1530    PT Time Calculation (min)  45 min    Equipment Utilized During Treatment  Gait belt    Activity Tolerance  Patient tolerated treatment well    Behavior During Therapy  [facility] for tasks assessed/performed       Past Medical History:  Diagnosis Date  . Duodenal obstruction   . GERD (gastroesophageal reflux disease)   . Hypertension   . Stroke [facility])    april 2018, lt sided weakness  . Trimalleolar fracture of ankle, closed, right, initial encounter     Past Surgical History:  Procedure Laterality Date  . BALLOON DILATION  12/31/2011   Procedure: BALLOON DILATION;  Surgeon: Missy Sabins, MD;  Location: [facility] ENDOSCOPY;  Service: Endoscopy;  Laterality: N/A;  . CHOLECYSTECTOMY  07/28/11  . ESOPHAGOGASTRODUODENOSCOPY  10/17/2011   Procedure: ESOPHAGOGASTRODUODENOSCOPY (EGD);  Surgeon: Missy Sabins, MD;  Location: [facility] ENDOSCOPY;  Service: Endoscopy;  Laterality: N/A;  . IR ANGIO INTRA EXTRACRAN SEL INTERNAL CAROTID BILAT MOD SED  04/07/2017  . IR ANGIO INTRA EXTRACRAN SEL INTERNAL CAROTID BILAT MOD SED  01/07/2018  . IR ANGIO VERTEBRAL SEL VERTEBRAL UNI R MOD SED  04/07/2017  . IR ANGIO VERTEBRAL SEL VERTEBRAL UNI R MOD SED  01/07/2018  . IR ANGIOGRAM FOLLOW UP STUDY  04/07/2017  . IR ANGIOGRAM FOLLOW UP STUDY  04/07/2017  . IR ANGIOGRAM FOLLOW UP STUDY  04/07/2017  . IR ANGIOGRAM FOLLOW UP STUDY  04/07/2017  . IR ANGIOGRAM FOLLOW UP STUDY  04/07/2017  . IR ANGIOGRAM  SELECTIVE EACH ADDITIONAL VESSEL  04/07/2017  . IR TRANSCATH/EMBOLIZ  04/07/2017  . IR US GUIDE VASC ACCESS RIGHT  01/07/2018  . ORIF ANKLE FRACTURE Right 07/01/2018   Procedure: RIGHT ANKLE FRACTURE OPEN TREATMENT TRIMALLEOLAR INCLUDES INTERNAL FIXATION WITHOUT FIXATION OF POSTERIOR LIP, FRACTURE CLOSED TREATMENT TRIMALLEOLAR ANKLE WITH MANIPULATION, REPAIR SYNDESMOSIS, REPAIR DELTOID LIGAMENT;  Surgeon: Renette Butters, MD;  Location: [place];  Service: Orthopedics;  Laterality: Right;  . PARS PLANA VITRECTOMY Right 05/01/2017   Procedure: PARS PLANA VITRECTOMY WITH 25 GAUGE RIGHT EYE, endolaser photocoaglation;  Surgeon: Jalene Mullet, MD;  Location: [place];  Service: Ophthalmology;  Laterality: Right;  . RADIOLOGY WITH ANESTHESIA N/A 04/07/2017   Procedure: RADIOLOGY WITH ANESTHESIA;  Surgeon: Consuella Lose, MD;  Location: [place];  Service: Radiology;  Laterality: N/A;  . REPLACEMENT TOTAL KNEE BILATERAL  2004  . TEMPOROMANDIBULAR JOINT SURGERY     2 surgeries  . TUMOR REMOVAL    . VAGOTOMY  01/23/2012   Procedure: VAGOTOMY, antrectomy and BII;  Surgeon: Haywood Lasso, MD;  Location: [place];  Service: General;  Laterality: N/A;  Laparotomy with vagotomy.    There were no vitals filed for this visit.  Subjective Assessment - 01/29/19 1454    Subjective  Pt wasn't able to come last week due to injuring her L 5th  toe.  Thinks she broke it but she didn't go to the doctor to have it checked.  Has been icing it and taping it for stability.  Stomach is bothering her today; had to go to the bathroom twice in first 10 minutes.    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  Yes                        Vestibular Treatment/Exercise - 01/29/19 1456      Vestibular  Treatment/Exercise   Vestibular Treatment Provided  Gaze    Gaze Exercises  X1 Viewing Horizontal;X1 Viewing Vertical      X1 Viewing Horizontal   Foot Position  seated on mat table with feet on floor    Reps  3    Comments  30 > 60 seconds      X1 Viewing Vertical   Foot Position  seated on mat with feet on floor    Reps  10    Comments  pt demonstrated decreased coordination for x1 viewing vertically today; therapist attempted to perform AAROM of vertical head movements keeping eyes on therapist's nose but pt was very guarded and would not let therapist move her head.  Had pt perform eye and head movements together with follow target and then had pt perform head movements with eyes fixed on target from middle <> up and middle <> down.  Finally pt able to put movement together and perform 10 continuous repetitions slowly while maintaining gaze on target            PT Education - 01/29/19 2014    Education provided  Yes    Education Details  added VOR x1 viewing back to [employer]) Educated  Patient    Methods  Explanation;Demonstration;Handout    Comprehension  Verbalized understanding;Returned demonstration       PT Short Term Goals - 01/09/19 1204      PT SHORT TERM GOAL #1   Title  Pt will report independence and compliance with land HEP;  continue participation in aquatic therapy with aquatic HEP to be established    Time  4    Period  Weeks    Status  New    Target Date  02/07/19      PT SHORT TERM GOAL #2   Title  Pt will improve gait speed to >/= 2.3 ft/esc indicative of community ambulation with rollator    Baseline  2.17 ft/sec with rollator on 01-05-19:    Time  4    Period  Weeks    Status  New    Target Date  02/07/19      PT SHORT TERM GOAL #3   Title  Pt will improve Berg score to >/= 45/56 indicating decreased fall risk potential     Baseline  43/56 on 01-05-19    Time  4    Period  Weeks    Status  New    Target Date  02/07/19      PT SHORT  TERM GOAL #4   Title  Pt will improve TUG score to >/= 17 seconds indicating decreased risk for falls and improvement in functional mobility using rollator    Baseline  19.91 secs on 01-05-19    Time  4    Period  Weeks    Status  New    Target Date  02/07/19        PT Long Term Goals - 01/08/19 1527      PT LONG TERM GOAL #1   Title  Pt will demonstrate independence with land and aquatic HEP    Baseline  Land HEP is established; aquatic HEP is on-going as pt is currently participating in aquatic therapy with OT    Time  8    Period  Weeks    Status  On-going    Target Date  03/10/19      PT LONG TERM GOAL #2   Title  Pt will improve Berg score to >/= 47/56 to decrease fall risk potential - CONTINUE this LTG    Baseline  43/56 on 01-05-19    Time  8    Period  Weeks    Status  Partially Met    Target Date  03/10/19      PT LONG TERM GOAL #3   Title  Pt will improve gait speed to >/=2.5 ft/sec indicating significant change in functional mobility with LRAD: REVISED 01-08-19:  Improve gait velocity to >/= 2.4 ft/sec with use of rollator for incr. gati efficiency.    Baseline  1.9 with RW, 2.17 with rollator    Time  8    Period  Weeks    Status  Not Met   REVISED   Target Date  03/10/19      PT LONG TERM GOAL #4   Title  Pt will improve TUG score to </= 13.5 seconds using LRAD indicating decreased fall risk potential ; REVISED to </= 15 secs with rollator     Baseline  23.37 with rolling walker, 19.91 with rollator    Time  8    Status  Not Met   REVISED   Target Date  03/10/19      PT LONG TERM GOAL #5   Title  Pt will negotiate 12 steps using B HR's and alternating stepping technique with supervision to improve community accessibility     Baseline  met 11-17-18    Status  Achieved      PT LONG TERM GOAL #6   Title  Pt will report ability to perform household ambulation using her RW with modified independence demonstrating significant improvement in functional mobility      Baseline  met 01-08-19; pt now has obtained rollator for assistance with ambulation    Status  Achieved      PT LONG TERM GOAL #7   Title  Pt will ambulate outdoors over paved surfaces and perform curbs and inclines with supervision using LRAD (ROLLATOR) for 300 feet to improve functional mobility with community distances     Time  8    Period  Weeks    Status  On-going    Target Date  03/10/19      PT LONG TERM GOAL #8   Title  Pt will ambulate over level surfaces with her RW for 230 feet with supervision using LRAD demonstrating improvement in functional mobility     Status  Achieved            Plan - 01/29/19 2015    Clinical Impression Statement  Pt returns after having to miss two weeks due to a toe injury - likely fractured.  Due to patient's toe continuing to be bruised/painful and due to pt feeling fatigued from GI issues today  focused session on reintroduction of VOR, x 1 viewing to HEP in sitting.  Pt able to progress horizontal x1 viewing in sitting to 60 seconds with very mild symptoms.  Pt initially unable to perform x 1 viewing in vertical direction and required increased time and cues to be able to perform 10 repetitions.  Pt given x1 viewing horizontal and vertical for HEP.  Will continue to address and progress towards LTG.    Rehab Potential  Good    Clinical Impairments Affecting Rehab Potential  high risk for falls due to abnormal gait, ataxia, sensory changes, and impaired cognition.     PT Frequency  2x / week    PT Duration  8 weeks    PT Treatment/Interventions  ADLs/Self Care Home Management;Aquatic Therapy;DME Instruction;Gait training;Stair training;Functional mobility training;Therapeutic activities;Therapeutic exercise;Balance training;Neuromuscular re-education;Patient/family education;Cognitive remediation;Orthotic Fit/Training;Electrical Stimulation;Cryotherapy;Moist Heat;Manual techniques;Passive range of motion;Taping;Energy conservation;Dry  needling;Vestibular    PT Next Visit Plan  Need to update HEP and focus on turning/pivoting/rotation, SLS/taps.  Gait training with rollator (10 lb in basket).  TDN to lumbar multifidus/quad/TFL/adductor/hip flexor; proximal stability and core activation in sidelying/supine (pilates?), tall kneeling (trunk stability during UE movement/resistance with theraband) and standing, focus on decreased use of lumbar extensors during gait, cont to work on balance, gait and trunk stabilization activities     PT Home Exercise Plan  Access Code: [email or web address]     Consulted and Agree with Plan of Care  Patient;Family member/caregiver    Family Member Consulted  husband       Patient will benefit from skilled therapeutic intervention in order to improve the following deficits and impairments:  Abnormal gait, Decreased balance, Decreased cognition, Decreased coordination, Decreased strength, Difficulty walking, Impaired sensation, Pain, Decreased activity tolerance, Decreased mobility, Decreased endurance, Postural dysfunction, Decreased knowledge of use of DME, Impaired perceived functional ability, Decreased range of motion, Impaired flexibility, Dizziness  Visit Diagnosis: Ataxic gait  Other lack of coordination  Unsteadiness on feet  Other abnormalities of gait and mobility     Problem List Patient Active Problem List   Diagnosis Date Noted  . Trimalleolar fracture of ankle, closed, right, initial encounter 11/27/2018  . Tobacco use disorder 11/27/2018  . Osteoarthritis of right hip 10/07/2018  . 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 Weakness with dizziness-  since [facility] and CVA 04/07/17 08/07/2017  . Disturbances of vision, late effect of stroke 08/06/2017  . Health education/counseling 08/05/2017  . High risk medications (not anticoagulants) long-term use 08/05/2017  . Neuritis-  R sided:  arm and leg/ body due to stroke 08/05/2017  . Elevated LDL cholesterol level 07/11/2017  . [employer] [facility]  [facility] (NIH) Stroke Scale limb ataxia score 2, ataxia present in two limbs 06/25/2017  . 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 Elevated vitamin B12 level 05/30/2017  . Vitamin D deficiency 05/29/2017  . History of tobacco abuse-  30pk yr hx - quit 04/07/17 05/21/2017  . Gait disturbance, post-stroke 05/14/2017  . Benign essential HTN   . Vitreous hemorrhage of right eye ([place])   . Adjustment disorder with mixed anxiety and depressed mood   . 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 s/p SAH (subarachnoid hemorrhage) ([place]) 04/19/2017  . 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 Hypoxia   . Subarachnoid hemorrhage due to ruptured aneurysm ([place]) 04/07/2017  . CVA (cerebral vascular accident) ([place]) 04/07/2017  . Elevated gastrin level 01/25/2012  .  Hypokalemia 01/21/2012  . Nausea & vomiting 01/20/2012  . Epigastric pain 01/20/2012  . Duodenal ulcer, acute with obstruction 10/17/2011  . S/P laparoscopic cholecystectomy 10/16/2011    Rico Junker, PT, DPT 01/29/19    8:25 PM    [facility] [address] [place], [place], [address] Phone: [phone]   Fax:  [phone]  Name: [name] Barnett MRN: [id number] Date of Birth: [birth year]

## 2019-01-30 ENCOUNTER — Ambulatory Visit: Payer: BLUE CROSS/BLUE SHIELD | Admitting: Physical Therapy

## 2019-02-02 ENCOUNTER — Encounter: Payer: Self-pay | Admitting: Physical Therapy

## 2019-02-02 ENCOUNTER — Ambulatory Visit: Payer: BLUE CROSS/BLUE SHIELD | Admitting: Physical Therapy

## 2019-02-02 DIAGNOSIS — M79651 Pain in right thigh: Secondary | ICD-10-CM

## 2019-02-02 DIAGNOSIS — R278 Other lack of coordination: Secondary | ICD-10-CM | POA: Diagnosis not present

## 2019-02-02 DIAGNOSIS — R42 Dizziness and giddiness: Secondary | ICD-10-CM

## 2019-02-02 DIAGNOSIS — R260 Ataxic gait: Secondary | ICD-10-CM

## 2019-02-02 DIAGNOSIS — R2689 Other abnormalities of gait and mobility: Secondary | ICD-10-CM

## 2019-02-02 DIAGNOSIS — R2681 Unsteadiness on feet: Secondary | ICD-10-CM

## 2019-02-02 NOTE — Therapy (Signed)
[facility] [address] [place], [place], [address] Phone: [phone]   Fax:  [phone]  Physical Therapy Treatment  Patient Details  Name: [name] Barnett MRN: [id number] Date of Birth: 1976-01-09 Referring Provider (PT): Edmonia Lynch, MD   Encounter Date: 02/02/2019  PT End of Session - 02/02/19 1554    Visit Number  30    Number of Visits  43    Date for PT Re-Evaluation  03/10/19    Authorization Type  [employer]    PT Start Time  1453    PT Stop Time  1534    PT Time Calculation (min)  41 min    Activity Tolerance  Patient tolerated treatment well    Behavior During Therapy  [facility] for tasks assessed/performed       Past Medical History:  Diagnosis Date  . Duodenal obstruction   . GERD (gastroesophageal reflux disease)   . Hypertension   . Stroke [facility])    april 2018, lt sided weakness  . Trimalleolar fracture of ankle, closed, right, initial encounter     Past Surgical History:  Procedure Laterality Date  . BALLOON DILATION  12/31/2011   Procedure: BALLOON DILATION;  Surgeon: Missy Sabins, MD;  Location: [facility] ENDOSCOPY;  Service: Endoscopy;  Laterality: N/A;  . CHOLECYSTECTOMY  07/28/11  . ESOPHAGOGASTRODUODENOSCOPY  10/17/2011   Procedure: ESOPHAGOGASTRODUODENOSCOPY (EGD);  Surgeon: Missy Sabins, MD;  Location: [facility] ENDOSCOPY;  Service: Endoscopy;  Laterality: N/A;  . IR ANGIO INTRA EXTRACRAN SEL INTERNAL CAROTID BILAT MOD SED  04/07/2017  . IR ANGIO INTRA EXTRACRAN SEL INTERNAL CAROTID BILAT MOD SED  01/07/2018  . IR ANGIO VERTEBRAL SEL VERTEBRAL UNI R MOD SED  04/07/2017  . IR ANGIO VERTEBRAL SEL VERTEBRAL UNI R MOD SED  01/07/2018  . IR ANGIOGRAM FOLLOW UP STUDY  04/07/2017  . IR ANGIOGRAM FOLLOW UP STUDY  04/07/2017  . IR ANGIOGRAM FOLLOW UP STUDY  04/07/2017  . IR ANGIOGRAM FOLLOW UP STUDY  04/07/2017  . IR ANGIOGRAM FOLLOW UP STUDY  04/07/2017  . IR ANGIOGRAM SELECTIVE EACH ADDITIONAL VESSEL  04/07/2017  . IR  TRANSCATH/EMBOLIZ  04/07/2017  . IR US GUIDE VASC ACCESS RIGHT  01/07/2018  . ORIF ANKLE FRACTURE Right 07/01/2018   Procedure: RIGHT ANKLE FRACTURE OPEN TREATMENT TRIMALLEOLAR INCLUDES INTERNAL FIXATION WITHOUT FIXATION OF POSTERIOR LIP, FRACTURE CLOSED TREATMENT TRIMALLEOLAR ANKLE WITH MANIPULATION, REPAIR SYNDESMOSIS, REPAIR DELTOID LIGAMENT;  Surgeon: Renette Butters, MD;  Location: [facility];  Service: Orthopedics;  Laterality: Right;  . PARS PLANA VITRECTOMY Right 05/01/2017   Procedure: PARS PLANA VITRECTOMY WITH 25 GAUGE RIGHT EYE, endolaser photocoaglation;  Surgeon: Jalene Mullet, MD;  Location: [facility];  Service: Ophthalmology;  Laterality: Right;  . RADIOLOGY WITH ANESTHESIA N/A 04/07/2017   Procedure: RADIOLOGY WITH ANESTHESIA;  Surgeon: Consuella Lose, MD;  Location: [facility];  Service: Radiology;  Laterality: N/A;  . REPLACEMENT TOTAL KNEE BILATERAL  2004  . TEMPOROMANDIBULAR JOINT SURGERY     2 surgeries  . TUMOR REMOVAL    . VAGOTOMY  01/23/2012   Procedure: VAGOTOMY, antrectomy and BII;  Surgeon: Haywood Lasso, MD;  Location: [facility];  Service: General;  Laterality: N/A;  Laparotomy with vagotomy.    There were no vitals filed for this visit.  Subjective Assessment - 02/02/19 1457    Subjective  Toe is better today; but R thigh is more painful this morning.  Asking if we could try dry needling again.  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  Yes                        Vestibular Treatment/Exercise - 02/02/19 1533      X1 Viewing Horizontal   Foot Position  seated > standing with feet apart, standing feet together    Reps  3    Comments  60 seconds, no UE support in standing      X1 Viewing Vertical   Foot Position  seated > standing with feet apart,  standing feet together    Reps  3    Comments  60 seconds, no UE support in standing      Trigger Point Dry Needling - 02/02/19 1534    Consent Given?  Yes    Muscles Treated Lower Body  Tensor fascia lata;Quadriceps    Tensor Fascia Lata Response  Twitch response elicited   R side; 3 needles, 25 x 60    Quadriceps Response  Palpable increased muscle length   R side; 2 needles 25 x 60          PT Education - 02/02/19 1535    Education provided  Yes    Education Details  VOR in standing feet apart, return to hip flexor/quad stretch on R in prone    Person(s) Educated  Patient;Spouse    Methods  Explanation;Demonstration    Comprehension  Returned demonstration;Verbalized understanding       PT Short Term Goals - 01/09/19 1204      PT SHORT TERM GOAL #1   Title  Pt will report independence and compliance with land HEP;  continue participation in aquatic therapy with aquatic HEP to be established    Time  4    Period  Weeks    Status  New    Target Date  02/07/19      PT SHORT TERM GOAL #2   Title  Pt will improve gait speed to >/= 2.3 ft/esc indicative of community ambulation with rollator    Baseline  2.17 ft/sec with rollator on 01-05-19:    Time  4    Period  Weeks    Status  New    Target Date  02/07/19      PT SHORT TERM GOAL #3   Title  Pt will improve Berg score to >/= 45/56 indicating decreased fall risk potential     Baseline  43/56 on 01-05-19    Time  4    Period  Weeks    Status  New    Target Date  02/07/19      PT SHORT TERM GOAL #4   Title  Pt will improve TUG score to >/= 17 seconds indicating decreased risk for falls and improvement in functional mobility using rollator    Baseline  19.91 secs on 01-05-19    Time  4    Period  Weeks    Status  New    Target Date  02/07/19        PT Long Term Goals - 01/08/19 1527      PT LONG TERM GOAL #1   Title  Pt will demonstrate independence with land and aquatic HEP    Baseline  Land HEP is  established; aquatic HEP is on-going as  pt is currently participating in aquatic therapy with OT    Time  8    Period  Weeks    Status  On-going    Target Date  03/10/19      PT LONG TERM GOAL #2   Title  Pt will improve Berg score to >/= 47/56 to decrease fall risk potential - CONTINUE this LTG    Baseline  43/56 on 01-05-19    Time  8    Period  Weeks    Status  Partially Met    Target Date  03/10/19      PT LONG TERM GOAL #3   Title  Pt will improve gait speed to >/=2.5 ft/sec indicating significant change in functional mobility with LRAD: REVISED 01-08-19:  Improve gait velocity to >/= 2.4 ft/sec with use of rollator for incr. gati efficiency.    Baseline  1.9 with RW, 2.17 with rollator    Time  8    Period  Weeks    Status  Not Met   REVISED   Target Date  03/10/19      PT LONG TERM GOAL #4   Title  Pt will improve TUG score to </= 13.5 seconds using LRAD indicating decreased fall risk potential ; REVISED to </= 15 secs with rollator     Baseline  23.37 with rolling walker, 19.91 with rollator    Time  8    Status  Not Met   REVISED   Target Date  03/10/19      PT LONG TERM GOAL #5   Title  Pt will negotiate 12 steps using B HR's and alternating stepping technique with supervision to improve community accessibility     Baseline  met 11-17-18    Status  Achieved      PT LONG TERM GOAL #6   Title  Pt will report ability to perform household ambulation using her RW with modified independence demonstrating significant improvement in functional mobility     Baseline  met 01-08-19; pt now has obtained rollator for assistance with ambulation    Status  Achieved      PT LONG TERM GOAL #7   Title  Pt will ambulate outdoors over paved surfaces and perform curbs and inclines with supervision using LRAD (ROLLATOR) for 300 feet to improve functional mobility with community distances     Time  8    Period  Weeks    Status  On-going    Target Date  03/10/19      PT LONG TERM GOAL  #8   Title  Pt will ambulate over level surfaces with her RW for 230 feet with supervision using LRAD demonstrating improvement in functional mobility     Status  Achieved            Plan - 02/02/19 1555    Clinical Impression Statement  Pt reporting increased tightness and pain in R anterior thigh; performed trigger point dry needling to R TFL and R quad with twitch response noted as well as increased active ROM with decreased pain during ambulation.  Continued with VOR x1 viewing in sitting progressing to standing without UE support with wide BOS and narrow BOS.  Pt demonstrating ability to now perform x1 viewing in vertical direction for 60 seconds.  Will continue to address and progress towards LTG.    Rehab Potential  Good    Clinical Impairments Affecting Rehab Potential  high risk for falls due to abnormal gait, ataxia, sensory  changes, and impaired cognition.     PT Frequency  2x / week    PT Duration  8 weeks    PT Treatment/Interventions  ADLs/Self Care Home Management;Aquatic Therapy;DME Instruction;Gait training;Stair training;Functional mobility training;Therapeutic activities;Therapeutic exercise;Balance training;Neuromuscular re-education;Patient/family education;Cognitive remediation;Orthotic Fit/Training;Electrical Stimulation;Cryotherapy;Moist Heat;Manual techniques;Passive range of motion;Taping;Energy conservation;Dry needling;Vestibular    PT Next Visit Plan  CHECK STG.  Need to update HEP and focus on turning/pivoting/rotation, SLS/taps.  Continue to progress x 1 viewing.  Gait training with rollator (10 lb in basket). proximal stability and core activation in sidelying/supine (pilates?), tall kneeling (trunk stability during UE movement/resistance with theraband) and standing, focus on decreased use of lumbar extensors during gait, cont to work on balance, gait and trunk stabilization activities     PT Home Exercise Plan  Access Code: [email or web address]     Consulted and Agree with  Plan of Care  Patient;Family member/caregiver    Family Member Consulted  husband       Patient will benefit from skilled therapeutic intervention in order to improve the following deficits and impairments:  Abnormal gait, Decreased balance, Decreased cognition, Decreased coordination, Decreased strength, Difficulty walking, Impaired sensation, Pain, Decreased activity tolerance, Decreased mobility, Decreased endurance, Postural dysfunction, Decreased knowledge of use of DME, Impaired perceived functional ability, Decreased range of motion, Impaired flexibility, Dizziness  Visit Diagnosis: Ataxic gait  Other lack of coordination  Other abnormalities of gait and mobility  Unsteadiness on feet  Pain in right thigh  Dizziness and giddiness     Problem List Patient Active Problem List   Diagnosis Date Noted  . Trimalleolar fracture of ankle, closed, right, initial encounter 11/27/2018  . Tobacco use disorder 11/27/2018  . Osteoarthritis of right hip 10/07/2018  . 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 Weakness with dizziness-  since [facility] and CVA 04/07/17 08/07/2017  . Disturbances of vision, late effect of stroke 08/06/2017  . Health education/counseling 08/05/2017  . High risk medications (not anticoagulants) long-term use 08/05/2017  . Neuritis-  R sided:  arm and leg/ body due to stroke 08/05/2017  . Elevated LDL cholesterol level 07/11/2017  . [employer] (NIH) Stroke Scale limb ataxia score 2, ataxia present in two limbs 06/25/2017  . 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 Elevated vitamin B12 level 05/30/2017  . Vitamin D deficiency 05/29/2017  . History of tobacco abuse-  30pk yr hx - quit 04/07/17 05/21/2017  . Gait disturbance, post-stroke 05/14/2017  . Benign essential HTN   . Vitreous hemorrhage of right eye ([place])   . Adjustment disorder with mixed anxiety and depressed mood   . 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 s/p SAH (subarachnoid hemorrhage) ([place]) 04/19/2017  . 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 Hypoxia   . Subarachnoid hemorrhage due to ruptured aneurysm ([place]) 04/07/2017  . CVA (cerebral vascular accident) ([place]) 04/07/2017  . Elevated gastrin level 01/25/2012  . Hypokalemia 01/21/2012  . Nausea & vomiting 01/20/2012  . Epigastric pain 01/20/2012  . Duodenal ulcer, acute with obstruction 10/17/2011  . S/P laparoscopic cholecystectomy 10/16/2011   Rico Junker, PT, DPT 02/02/19    4:00 PM    [facility] [address] [place], [place], [address] Phone: [phone]   Fax:  [phone]  Name: GISSEL KEILMAN MRN: [id number] Date of Birth: February 21, 1976

## 2019-02-05 ENCOUNTER — Ambulatory Visit: Payer: BLUE CROSS/BLUE SHIELD | Admitting: Physical Therapy

## 2019-02-05 DIAGNOSIS — R278 Other lack of coordination: Secondary | ICD-10-CM

## 2019-02-05 DIAGNOSIS — R2681 Unsteadiness on feet: Secondary | ICD-10-CM

## 2019-02-05 DIAGNOSIS — R2689 Other abnormalities of gait and mobility: Secondary | ICD-10-CM

## 2019-02-06 NOTE — Therapy (Signed)
[facility] [address] [place], [place], [address] Phone: [phone]   Fax:  [phone]  Physical Therapy Treatment  Patient Details  Name: [name] Barnett MRN: [id number] Date of Birth: 25-Sep-1976 Referring Provider (PT): Edmonia Lynch, MD   Encounter Date: 02/05/2019  PT End of Session - 02/06/19 1030    Visit Number  31    Number of Visits  43    Date for PT Re-Evaluation  03/10/19    Authorization Type  [employer]    PT Start Time  1532    PT Stop Time  1623    PT Time Calculation (min)  51 min       Past Medical History:  Diagnosis Date  . Duodenal obstruction   . GERD (gastroesophageal reflux disease)   . Hypertension   . Stroke [facility])    april 2018, lt sided weakness  . Trimalleolar fracture of ankle, closed, right, initial encounter     Past Surgical History:  Procedure Laterality Date  . BALLOON DILATION  12/31/2011   Procedure: BALLOON DILATION;  Surgeon: Missy Sabins, MD;  Location: [facility] ENDOSCOPY;  Service: Endoscopy;  Laterality: N/A;  . CHOLECYSTECTOMY  07/28/11  . ESOPHAGOGASTRODUODENOSCOPY  10/17/2011   Procedure: ESOPHAGOGASTRODUODENOSCOPY (EGD);  Surgeon: Missy Sabins, MD;  Location: [facility] ENDOSCOPY;  Service: Endoscopy;  Laterality: N/A;  . IR ANGIO INTRA EXTRACRAN SEL INTERNAL CAROTID BILAT MOD SED  04/07/2017  . IR ANGIO INTRA EXTRACRAN SEL INTERNAL CAROTID BILAT MOD SED  01/07/2018  . IR ANGIO VERTEBRAL SEL VERTEBRAL UNI R MOD SED  04/07/2017  . IR ANGIO VERTEBRAL SEL VERTEBRAL UNI R MOD SED  01/07/2018  . IR ANGIOGRAM FOLLOW UP STUDY  04/07/2017  . IR ANGIOGRAM FOLLOW UP STUDY  04/07/2017  . IR ANGIOGRAM FOLLOW UP STUDY  04/07/2017  . IR ANGIOGRAM FOLLOW UP STUDY  04/07/2017  . IR ANGIOGRAM FOLLOW UP STUDY  04/07/2017  . IR ANGIOGRAM SELECTIVE EACH ADDITIONAL VESSEL  04/07/2017  . IR TRANSCATH/EMBOLIZ  04/07/2017  . IR US GUIDE VASC ACCESS RIGHT  01/07/2018  . ORIF ANKLE FRACTURE Right 07/01/2018    Procedure: RIGHT ANKLE FRACTURE OPEN TREATMENT TRIMALLEOLAR INCLUDES INTERNAL FIXATION WITHOUT FIXATION OF POSTERIOR LIP, FRACTURE CLOSED TREATMENT TRIMALLEOLAR ANKLE WITH MANIPULATION, REPAIR SYNDESMOSIS, REPAIR DELTOID LIGAMENT;  Surgeon: Renette Butters, MD;  Location: [facility];  Service: Orthopedics;  Laterality: Right;  . PARS PLANA VITRECTOMY Right 05/01/2017   Procedure: PARS PLANA VITRECTOMY WITH 25 GAUGE RIGHT EYE, endolaser photocoaglation;  Surgeon: Jalene Mullet, MD;  Location: [place];  Service: Ophthalmology;  Laterality: Right;  . RADIOLOGY WITH ANESTHESIA N/A 04/07/2017   Procedure: RADIOLOGY WITH ANESTHESIA;  Surgeon: Consuella Lose, MD;  Location: [place];  Service: Radiology;  Laterality: N/A;  . REPLACEMENT TOTAL KNEE BILATERAL  2004  . TEMPOROMANDIBULAR JOINT SURGERY     2 surgeries  . TUMOR REMOVAL    . VAGOTOMY  01/23/2012   Procedure: VAGOTOMY, antrectomy and BII;  Surgeon: Haywood Lasso, MD;  Location: [place] [facility];  Service: General;  Laterality: N/A;  Laparotomy with vagotomy.    There were no vitals filed for this visit.  Subjective Assessment - 02/06/19 1015    Subjective  Pt states she is having injection (nerve block in Rt groin) next Wed. 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), Duodenal obstruction, HTN, GERD    Limitations  Lifting;Standing;Walking;House hold activities    Patient Stated Goals  Pt reports goal of walking with no AD and improve balance to reduce fall risk         [facility] PT Assessment - 02/06/19 0001      Berg Balance Test   Sit to Stand  Able to stand without using hands and stabilize independently    Standing Unsupported  Able to stand safely 2 minutes    Sitting with Back Unsupported but Feet Supported on Floor or Stool  Able to sit safely and securely 2 minutes    Stand to Sit  Sits safely with minimal  use of hands    Transfers  Able to transfer safely, definite need of hands    Standing Unsupported with Eyes Closed  Able to stand 10 seconds safely    Standing Ubsupported with Feet Together  Needs help to attain position but able to stand for 30 seconds with feet together   needs assistance to bring legs together   From Standing, Reach Forward with Outstretched Arm  Can reach confidently >25 cm (10")    From Standing Position, Pick up Object from Floor  Able to pick up shoe safely and easily    From Standing Position, Turn to Look Behind Over each Shoulder  Turn sideways only but maintains balance    Turn 360 Degrees  Needs assistance while turning    Standing Unsupported, Alternately Place Feet on Step/Stool  Able to complete >2 steps/needs minimal assist    Standing Unsupported, One Foot in Front  Able to take small step independently and hold 30 seconds    Standing on One Leg  Tries to lift leg/unable to hold 3 seconds but remains standing independently    Total Score  38                   [facility] Adult PT Treatment/Exercise - 02/06/19 0001      Ambulation/Gait   Ambulation/Gait  Yes    Ambulation/Gait Assistance  4: Min guard    Ambulation/Gait Assistance Details  pt used her rollator; was raised 2 noches to facilitate more upright posture during gait     Ambulation Distance (Feet)  500 Feet    Assistive device  Rollator    Gait Pattern  Step-through pattern;Ataxic;Trunk flexed    Ambulation Surface  Level;Indoor    Gait velocity  14.00 = 2.34 ft/sec;  12.78 secs = 2.57 ft/sec   with rollator     Standardized Balance Assessment   Standardized Balance Assessment  Timed Up and Go Test      Timed Up and Go Test   TUG  Normal TUG    Normal TUG (seconds)  17.34   2nd trial = 17.22 secs with rollator      NeuroRe-ed;  Tall kneeling activities on blue mat on floor - amb. Sideways, forwards, and backwards on knees in tall kneeling 2 reps each with UE support on mat  prn  Quadruped - lifting opposite UE/LE 3 reps each side with 3 sec hold with min assist for balance Lt hip extension x 10 reps in quadruped position; Lt knee flexion/extension with Lt hip extended in quadruped position with min assist  To maintain balance in quadruped position         PT Short Term Goals - 02/06/19 1017      PT SHORT TERM GOAL #1   Title  Pt will  report independence and compliance with land HEP;  continue participation in aquatic therapy with aquatic HEP to be established    Baseline  met per pt report - pt continues to participate in aquatic therapy with OT - 02-05-19    Status  Achieved      PT SHORT TERM GOAL #2   Title  Pt will improve gait speed to >/= 2.3 ft/esc indicative of community ambulation with rollator    Baseline  2.17 ft/sec with rollator on 01-05-19:  2 trials of gait speed tested - 14.00 secs = 2.34 ft/sec & 12.78 secs = 2.57 ft/sec withh rollator - 02-05-19    Status  Achieved      PT SHORT TERM GOAL #3   Title  Pt will improve Berg score to >/= 45/56 indicating decreased fall risk potential     Baseline  43/56 on 01-05-19; Berg score 38/56 (5 point decrease from 01-05-19) on 02-05-19    Status  Not Met      PT SHORT TERM GOAL #4   Title  Pt will improve TUG score to >/= 17 seconds indicating decreased risk for falls and improvement in functional mobility using rollator    Baseline  19.91 secs on 01-05-19; 2 trials of TUG completed:  17.34 secs, 17.22 secs - improvement with score but not fully met stated goal - 02-05-19; instability noted with turn with fall risk noted during TUG today    Status  Partially Met      PT SHORT TERM GOAL #5   Title  Pt will report </= 5/10 pain in R hip during exercises, WB and gait    Baseline  12/08/18: 8/10 before and after session; 6/10 pain intensity reported on 02-05-19 in Lt groin with pt scheduled for nerve block on 02-11-19    Status  Not Met        PT Long Term Goals - 01/08/19 1527      PT LONG TERM GOAL  #1   Title  Pt will demonstrate independence with land and aquatic HEP    Baseline  Land HEP is established; aquatic HEP is on-going as pt is currently participating in aquatic therapy with OT    Time  8    Period  Weeks    Status  On-going    Target Date  03/10/19      PT LONG TERM GOAL #2   Title  Pt will improve Berg score to >/= 47/56 to decrease fall risk potential - CONTINUE this LTG    Baseline  43/56 on 01-05-19    Time  8    Period  Weeks    Status  Partially Met    Target Date  03/10/19      PT LONG TERM GOAL #3   Title  Pt will improve gait speed to >/=2.5 ft/sec indicating significant change in functional mobility with LRAD: REVISED 01-08-19:  Improve gait velocity to >/= 2.4 ft/sec with use of rollator for incr. gati efficiency.    Baseline  1.9 with RW, 2.17 with rollator    Time  8    Period  Weeks    Status  Not Met   REVISED   Target Date  03/10/19      PT LONG TERM GOAL #4   Title  Pt will improve TUG score to </= 13.5 seconds using LRAD indicating decreased fall risk potential ; REVISED to </= 15 secs with rollator     Baseline  23.37 with rolling  walker, 19.91 with rollator    Time  8    Status  Not Met   REVISED   Target Date  03/10/19      PT LONG TERM GOAL #5   Title  Pt will negotiate 12 steps using B HR's and alternating stepping technique with supervision to improve community accessibility     Baseline  met 11-17-18    Status  Achieved      PT LONG TERM GOAL #6   Title  Pt will report ability to perform household ambulation using her RW with modified independence demonstrating significant improvement in functional mobility     Baseline  met 01-08-19; pt now has obtained rollator for assistance with ambulation    Status  Achieved      PT LONG TERM GOAL #7   Title  Pt will ambulate outdoors over paved surfaces and perform curbs and inclines with supervision using LRAD (ROLLATOR) for 300 feet to improve functional mobility with community distances      Time  8    Period  Weeks    Status  On-going    Target Date  03/10/19      PT LONG TERM GOAL #8   Title  Pt will ambulate over level surfaces with her RW for 230 feet with supervision using LRAD demonstrating improvement in functional mobility     Status  Achieved            Plan - 02/06/19 1031    Clinical Impression Statement  Pt has met STG's #1 and 2:  STG's #3 and 4 not met; Berg score has decreased from 43/56 to 38/56 and TUG goal is partially met with score improved from 19.91 secs with rollator to 17.34 secs with rollator - improved but not fully met stated goal.  Pt noted to be unsteady with increased amb. speed and also with turns.      Rehab Potential  Good    Clinical Impairments Affecting Rehab Potential  high risk for falls due to abnormal gait, ataxia, sensory changes, and impaired cognition.     PT Frequency  2x / week    PT Duration  8 weeks    PT Treatment/Interventions  ADLs/Self Care Home Management;Aquatic Therapy;DME Instruction;Gait training;Stair training;Functional mobility training;Therapeutic activities;Therapeutic exercise;Balance training;Neuromuscular re-education;Patient/family education;Cognitive remediation;Orthotic Fit/Training;Electrical Stimulation;Cryotherapy;Moist Heat;Manual techniques;Passive range of motion;Taping;Energy conservation;Dry needling;Vestibular    PT Next Visit Plan   Need to update HEP and focus on turning/pivoting/rotation, SLS/taps.  Continue to progress x 1 viewing.  Gait training with rollator (10 lb in basket). proximal stability and core activation in sidelying/supine (pilates?), tall kneeling (trunk stability during UE movement/resistance with theraband) and standing, focus on decreased use of lumbar extensors during gait, cont to work on balance, gait and trunk stabilization activities     PT Home Exercise Plan  Access Code: [email or web address]     Consulted and Agree with Plan of Care  Patient;Family member/caregiver    Family Member  Consulted  husband       Patient will benefit from skilled therapeutic intervention in order to improve the following deficits and impairments:  Abnormal gait, Decreased balance, Decreased cognition, Decreased coordination, Decreased strength, Difficulty walking, Impaired sensation, Pain, Decreased activity tolerance, Decreased mobility, Decreased endurance, Postural dysfunction, Decreased knowledge of use of DME, Impaired perceived functional ability, Decreased range of motion, Impaired flexibility, Dizziness  Visit Diagnosis: Other abnormalities of gait and mobility  Unsteadiness on feet  Other lack of coordination     Problem List  Patient Active Problem List   Diagnosis Date Noted  . Trimalleolar fracture of ankle, closed, right, initial encounter 11/27/2018  . Tobacco use disorder 11/27/2018  . Osteoarthritis of right hip 10/07/2018  . 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 Weakness with dizziness-  since [employer] and CVA 04/07/17 08/07/2017  . Disturbances of vision, late effect of stroke 08/06/2017  . Health education/counseling 08/05/2017  . High risk medications (not anticoagulants) long-term use 08/05/2017  . Neuritis-  R sided:  arm and leg/ body due to stroke 08/05/2017  . Elevated LDL cholesterol level 07/11/2017  . [employer] (NIH) Stroke Scale limb ataxia score 2, ataxia present in two limbs 06/25/2017  . 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 Elevated vitamin B12 level 05/30/2017  . Vitamin D deficiency 05/29/2017  . History of tobacco abuse-  30pk yr hx - quit 04/07/17 05/21/2017  . Gait disturbance, post-stroke 05/14/2017  . Benign essential HTN   . Vitreous hemorrhage of right eye ([place])   . Adjustment disorder with mixed anxiety and depressed mood   . 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 s/p SAH (subarachnoid hemorrhage) ([place]) 04/19/2017  . 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 Hypoxia   . Subarachnoid hemorrhage due to ruptured aneurysm ([place]) 04/07/2017  . CVA (cerebral vascular accident) ([place]) 04/07/2017  . Elevated gastrin level 01/25/2012  . Hypokalemia 01/21/2012  . Nausea & vomiting 01/20/2012  . Epigastric pain 01/20/2012  . Duodenal ulcer, acute with obstruction 10/17/2011  . S/P laparoscopic cholecystectomy 10/16/2011    Alda Lea, PT 02/06/2019, 10:35 AM  [facility] [address] [place], [place], [address] Phone: [phone]   Fax:  [phone]  Name: [name] Barnett MRN: [id number] Date of Birth: 08-Nov-1976

## 2019-02-09 ENCOUNTER — Encounter: Payer: Self-pay | Admitting: Physical Therapy

## 2019-02-09 ENCOUNTER — Ambulatory Visit: Payer: BLUE CROSS/BLUE SHIELD | Attending: Orthopedic Surgery | Admitting: Physical Therapy

## 2019-02-09 DIAGNOSIS — I69018 Other symptoms and signs involving cognitive functions following nontraumatic subarachnoid hemorrhage: Secondary | ICD-10-CM | POA: Diagnosis present

## 2019-02-09 DIAGNOSIS — R2681 Unsteadiness on feet: Secondary | ICD-10-CM | POA: Diagnosis present

## 2019-02-09 DIAGNOSIS — R270 Ataxia, unspecified: Secondary | ICD-10-CM | POA: Insufficient documentation

## 2019-02-09 DIAGNOSIS — R278 Other lack of coordination: Secondary | ICD-10-CM | POA: Diagnosis present

## 2019-02-09 DIAGNOSIS — I69054 Hemiplegia and hemiparesis following nontraumatic subarachnoid hemorrhage affecting left non-dominant side: Secondary | ICD-10-CM | POA: Diagnosis present

## 2019-02-09 DIAGNOSIS — R208 Other disturbances of skin sensation: Secondary | ICD-10-CM | POA: Diagnosis present

## 2019-02-09 DIAGNOSIS — R41842 Visuospatial deficit: Secondary | ICD-10-CM | POA: Diagnosis present

## 2019-02-09 DIAGNOSIS — R293 Abnormal posture: Secondary | ICD-10-CM | POA: Insufficient documentation

## 2019-02-09 DIAGNOSIS — R260 Ataxic gait: Secondary | ICD-10-CM | POA: Insufficient documentation

## 2019-02-09 DIAGNOSIS — R41840 Attention and concentration deficit: Secondary | ICD-10-CM | POA: Insufficient documentation

## 2019-02-09 DIAGNOSIS — R42 Dizziness and giddiness: Secondary | ICD-10-CM | POA: Diagnosis present

## 2019-02-09 DIAGNOSIS — M79651 Pain in right thigh: Secondary | ICD-10-CM

## 2019-02-09 DIAGNOSIS — R2689 Other abnormalities of gait and mobility: Secondary | ICD-10-CM | POA: Diagnosis present

## 2019-02-09 DIAGNOSIS — M6281 Muscle weakness (generalized): Secondary | ICD-10-CM | POA: Diagnosis present

## 2019-02-09 NOTE — Patient Instructions (Signed)
Access Code: [email or web address]  URL: [email or web address]  Date: 02/09/2019  Prepared by: Bufford Lope   Exercises  Supine Bridge - 10 reps - 1 sets - 5 hold - 1x daily - 7x weekly  Seated Hamstring Stretch with Chair - 3 reps - 1 sets - 30 hold - 1x daily - 7x weekly  Sit to Stand - 5-10 reps - 1 sets - 1x daily - 7x weekly  Ankle Dorsiflexion with Resistance - 10 reps - 1 sets - 1x daily - 7x weekly  Seated Ankle Plantar Flexion with Resistance Loop - 10 reps - 1 sets - 1x daily - 7x weekly  Romberg Stance with Eyes Closed - 3 reps - 1 sets - 30 hold - 1x daily - 7x weekly  Standing with Head Rotation - 10 reps - 1 sets - 1x daily - 7x weekly  Standing Gastroc Stretch on Step with Counter Support - 3 sets - 30 second hold - 1x daily - 7x weekly  Prone Quadriceps Stretch - 3 sets - 30 second hold - 1x daily - 7x weekly  Walking around hula hoops - 5 reps - 1x daily - 7x weekly  Standing Single Leg Stance with Unilateral Counter Support - 4 sets - 20 second hold - 1x daily - 7x weekly

## 2019-02-09 NOTE — Therapy (Addendum)
[facility] [address] [place], [place], [address] Phone: [phone]   Fax:  [phone]  Physical Therapy Treatment  Patient Details  Name: [name] Barnett MRN: [id number] Date of Birth: September 18, 1976 Referring Provider (PT): Edmonia Lynch, MD   Encounter Date: 02/09/2019  PT End of Session - 02/09/19 1653    Visit Number  32    Number of Visits  43    Date for PT Re-Evaluation  03/10/19    Authorization Type  [employer]    PT Start Time  1450    PT Stop Time  1535    PT Time Calculation (min)  45 min    Activity Tolerance  Patient tolerated treatment well    Behavior During Therapy  [facility] for tasks assessed/performed       Past Medical History:  Diagnosis Date  . Duodenal obstruction   . GERD (gastroesophageal reflux disease)   . Hypertension   . Stroke [facility])    april 2018, lt sided weakness  . Trimalleolar fracture of ankle, closed, right, initial encounter     Past Surgical History:  Procedure Laterality Date  . BALLOON DILATION  12/31/2011   Procedure: BALLOON DILATION;  Surgeon: Missy Sabins, MD;  Location: [facility] ENDOSCOPY;  Service: Endoscopy;  Laterality: N/A;  . CHOLECYSTECTOMY  07/28/11  . ESOPHAGOGASTRODUODENOSCOPY  10/17/2011   Procedure: ESOPHAGOGASTRODUODENOSCOPY (EGD);  Surgeon: Missy Sabins, MD;  Location: [facility] ENDOSCOPY;  Service: Endoscopy;  Laterality: N/A;  . IR ANGIO INTRA EXTRACRAN SEL INTERNAL CAROTID BILAT MOD SED  04/07/2017  . IR ANGIO INTRA EXTRACRAN SEL INTERNAL CAROTID BILAT MOD SED  01/07/2018  . IR ANGIO VERTEBRAL SEL VERTEBRAL UNI R MOD SED  04/07/2017  . IR ANGIO VERTEBRAL SEL VERTEBRAL UNI R MOD SED  01/07/2018  . IR ANGIOGRAM FOLLOW UP STUDY  04/07/2017  . IR ANGIOGRAM FOLLOW UP STUDY  04/07/2017  . IR ANGIOGRAM FOLLOW UP STUDY  04/07/2017  . IR ANGIOGRAM FOLLOW UP STUDY  04/07/2017  . IR ANGIOGRAM FOLLOW UP STUDY  04/07/2017  . IR ANGIOGRAM SELECTIVE EACH ADDITIONAL VESSEL  04/07/2017  . IR  TRANSCATH/EMBOLIZ  04/07/2017  . IR US GUIDE VASC ACCESS RIGHT  01/07/2018  . ORIF ANKLE FRACTURE Right 07/01/2018   Procedure: RIGHT ANKLE FRACTURE OPEN TREATMENT TRIMALLEOLAR INCLUDES INTERNAL FIXATION WITHOUT FIXATION OF POSTERIOR LIP, FRACTURE CLOSED TREATMENT TRIMALLEOLAR ANKLE WITH MANIPULATION, REPAIR SYNDESMOSIS, REPAIR DELTOID LIGAMENT;  Surgeon: Renette Butters, MD;  Location: [place];  Service: Orthopedics;  Laterality: Right;  . PARS PLANA VITRECTOMY Right 05/01/2017   Procedure: PARS PLANA VITRECTOMY WITH 25 GAUGE RIGHT EYE, endolaser photocoaglation;  Surgeon: Jalene Mullet, MD;  Location: [place];  Service: Ophthalmology;  Laterality: Right;  . RADIOLOGY WITH ANESTHESIA N/A 04/07/2017   Procedure: RADIOLOGY WITH ANESTHESIA;  Surgeon: Consuella Lose, MD;  Location: [place];  Service: Radiology;  Laterality: N/A;  . REPLACEMENT TOTAL KNEE BILATERAL  2004  . TEMPOROMANDIBULAR JOINT SURGERY     2 surgeries  . TUMOR REMOVAL    . VAGOTOMY  01/23/2012   Procedure: VAGOTOMY, antrectomy and BII;  Surgeon: Haywood Lasso, MD;  Location: [place];  Service: General;  Laterality: N/A;  Laparotomy with vagotomy.    There were no vitals filed for this visit.  Subjective Assessment - 02/09/19 1639    Subjective  Still planning on having nerve block this week; is concerned it will not help.  Pain in RLE is much worse today.  Wants to have eyes rechecked, feels that they have changed since receiving prisms.    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  Yes    Pain Score  8     Pain Location  Groin    Pain Orientation  Right    Pain Type  Neuropathic pain                             Balance Exercises - 02/09/19 1640      Balance Exercises: Standing   SLS  Eyes open;Solid surface;Upper extremity support 1;Intermittent upper extremity support;3 reps;10 secs   partial SLS with one foot on bottom shelf of lower cabinet.    Turning  Right;Left;5 reps    [facility] Rotation  Solid surface;Upper extremity assist 1;Right turn;Left turn    [facility] Rotation Limitations  figure 8 and then full 360 turns around hula hoops to L and R with one UE support on cane with verbal cues and facilitation of controlled weight shifting and verbal cues for step through gait sequence.  At times pt required LUE support as well as using cane in RUE and intermittent max-total A to regain balance when she experiences a lateral and posterior LOB.  Pt agreeable to perform this activity at home.    Other Standing Exercises  Due to continued difficulty with rotation and pivoting attempted to add quarter turns at counter top to HEP.  Pt unable to tolerate due to LE pain and stated, "I won't do those at home!"  Transitioned to figure 8 and then full 360 around hula hoops: see above.        PT Education - 02/09/19 1653    Education provided  Yes    Education Details  added turning and partial SLS to HEP    Person(s) Educated  Patient;Spouse    Methods  Explanation;Demonstration    Comprehension  Verbalized understanding;Returned demonstration       PT Short Term Goals - 02/06/19 1017      PT SHORT TERM GOAL #1   Title  Pt will report independence and compliance with land HEP;  continue participation in aquatic therapy with aquatic HEP to be established    Baseline  met per pt report - pt continues to participate in aquatic therapy with OT - 02-05-19    Status  Achieved      PT SHORT TERM GOAL #2   Title  Pt will improve gait speed to >/= 2.3 ft/esc indicative of community ambulation with rollator    Baseline  2.17 ft/sec with rollator on 01-05-19:  2 trials of gait  speed tested - 14.00 secs = 2.34 ft/sec & 12.78 secs = 2.57 ft/sec withh rollator - 02-05-19    Status  Achieved      PT SHORT TERM GOAL #3   Title  Pt will improve Berg score to >/= 45/56 indicating decreased fall risk potential     Baseline  43/56 on 01-05-19; Berg score 38/56 (5 point decrease from 01-05-19) on 02-05-19    Status  Not Met      PT SHORT TERM GOAL #4   Title  Pt will  improve TUG score to >/= 17 seconds indicating decreased risk for falls and improvement in functional mobility using rollator    Baseline  19.91 secs on 01-05-19; 2 trials of TUG completed:  17.34 secs, 17.22 secs - improvement with score but not fully met stated goal - 02-05-19; instability noted with turn with fall risk noted during TUG today    Status  Partially Met      PT SHORT TERM GOAL #5   Title  Pt will report </= 5/10 pain in R hip during exercises, WB and gait    Baseline  12/08/18: 8/10 before and after session; 6/10 pain intensity reported on 02-05-19 in Lt groin with pt scheduled for nerve block on 02-11-19    Status  Not Met        PT Long Term Goals - 01/08/19 1527      PT LONG TERM GOAL #1   Title  Pt will demonstrate independence with land and aquatic HEP    Baseline  Land HEP is established; aquatic HEP is on-going as pt is currently participating in aquatic therapy with OT    Time  8    Period  Weeks    Status  On-going    Target Date  03/10/19      PT LONG TERM GOAL #2   Title  Pt will improve Berg score to >/= 47/56 to decrease fall risk potential - CONTINUE this LTG    Baseline  43/56 on 01-05-19    Time  8    Period  Weeks    Status  Partially Met    Target Date  03/10/19      PT LONG TERM GOAL #3   Title  Pt will improve gait speed to >/=2.5 ft/sec indicating significant change in functional mobility with LRAD: REVISED 01-08-19:  Improve gait velocity to >/= 2.4 ft/sec with use of rollator for incr. gati efficiency.    Baseline  1.9 with RW, 2.17 with rollator    Time  8     Period  Weeks    Status  Not Met   REVISED   Target Date  03/10/19      PT LONG TERM GOAL #4   Title  Pt will improve TUG score to </= 13.5 seconds using LRAD indicating decreased fall risk potential ; REVISED to </= 15 secs with rollator     Baseline  23.37 with rolling walker, 19.91 with rollator    Time  8    Status  Not Met   REVISED   Target Date  03/10/19      PT LONG TERM GOAL #5   Title  Pt will negotiate 12 steps using B HR's and alternating stepping technique with supervision to improve community accessibility     Baseline  met 11-17-18    Status  Achieved      PT LONG TERM GOAL #6   Title  Pt will report ability to perform household ambulation using her RW with modified independence demonstrating significant improvement in functional mobility     Baseline  met 01-08-19; pt now has obtained rollator for assistance with ambulation    Status  Achieved      PT LONG TERM GOAL #7   Title  Pt will ambulate outdoors over paved surfaces and perform curbs and inclines with supervision using LRAD (ROLLATOR) for 300 feet to improve functional mobility with community distances     Time  8    Period  Weeks  Status  On-going    Target Date  03/10/19      PT LONG TERM GOAL #8   Title  Pt will ambulate over level surfaces with her RW for 230 feet with supervision using LRAD demonstrating improvement in functional mobility     Status  Achieved            Plan - 02/09/19 1654    Clinical Impression Statement  Treatment session focused on adding turns/pivoting to HEP and returning partial SLS back to HEP based on areas of balance pt continues to have increased difficulty with and demonstrates increased falls risk with.  Pt continues to be limited by R hip pain during various activities but hopes to experience pain relief on Wednesday with nerve block.  Will continue to address and progress towards LTG.    Rehab Potential  Good    Clinical Impairments Affecting Rehab Potential  high  risk for falls due to abnormal gait, ataxia, sensory changes, and impaired cognition.     PT Frequency  2x / week    PT Duration  8 weeks    PT Treatment/Interventions  ADLs/Self Care Home Management;Aquatic Therapy;DME Instruction;Gait training;Stair training;Functional mobility training;Therapeutic activities;Therapeutic exercise;Balance training;Neuromuscular re-education;Patient/family education;Cognitive remediation;Orthotic Fit/Training;Electrical Stimulation;Cryotherapy;Moist Heat;Manual techniques;Passive range of motion;Taping;Energy conservation;Dry needling;Vestibular    PT Next Visit Plan  DID NERVE BLOCK HELP WITH PAIN in R HIP?   Continue to revise HEP.  Continue to work on turning/pivoting with one UE support, partial SLS.   Balance with narrow BOS.  Continue to progress x 1 viewing.  Gait training with rollator (10 lb in basket). proximal stability and core activation in sidelying/supine (pilates?), tall kneeling (trunk stability during UE movement/resistance with theraband) and standing, focus on decreased use of lumbar extensors during gait, cont to work on balance, gait and trunk stabilization activities     PT Home Exercise Plan  Access Code: [email or web address]     Consulted and Agree with Plan of Care  Patient;Family member/caregiver    Family Member Consulted  husband       Patient will benefit from skilled therapeutic intervention in order to improve the following deficits and impairments:  Abnormal gait, Decreased balance, Decreased cognition, Decreased coordination, Decreased strength, Difficulty walking, Impaired sensation, Pain, Decreased activity tolerance, Decreased mobility, Decreased endurance, Postural dysfunction, Decreased knowledge of use of DME, Impaired perceived functional ability, Decreased range of motion, Impaired flexibility, Dizziness  Visit Diagnosis: Ataxic gait  Other lack of coordination  Other abnormalities of gait and mobility  Unsteadiness on feet  Pain  in right thigh  Dizziness and giddiness     Problem List Patient Active Problem List   Diagnosis Date Noted  . Trimalleolar fracture of ankle, closed, right, initial encounter 11/27/2018  . Tobacco use disorder 11/27/2018  . Osteoarthritis of right hip 10/07/2018  . 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 Weakness with dizziness-  since [facility] and CVA 04/07/17 08/07/2017  . Disturbances of vision, late effect of stroke 08/06/2017  . Health education/counseling 08/05/2017  . High risk medications (not anticoagulants) long-term use 08/05/2017  . Neuritis-  R sided:  arm and leg/ body due to stroke 08/05/2017  . Elevated LDL cholesterol level 07/11/2017  . [employer] [facility] [employer] (NIH) Stroke Scale limb ataxia score 2, ataxia present in two limbs 06/25/2017  . 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 Elevated vitamin B12 level 05/30/2017  . Vitamin D deficiency 05/29/2017  . History of tobacco abuse-  30pk yr hx -  quit 04/07/17 05/21/2017  . Gait disturbance, post-stroke 05/14/2017  . Benign essential HTN   . Vitreous hemorrhage of right eye ([place])   . Adjustment disorder with mixed anxiety and depressed mood   . 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 s/p SAH (subarachnoid hemorrhage) ([place]) 04/19/2017  . 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 Hypoxia   . Subarachnoid hemorrhage due to ruptured aneurysm ([place]) 04/07/2017  . CVA (cerebral vascular accident) ([place]) 04/07/2017  . Elevated gastrin level 01/25/2012  . Hypokalemia 01/21/2012  . Nausea & vomiting 01/20/2012  . Epigastric pain 01/20/2012  . Duodenal ulcer, acute with obstruction 10/17/2011  . S/P laparoscopic cholecystectomy 10/16/2011    Rico Junker, PT, DPT 02/09/19    5:10 PM    [facility] [address] [place], [place], [address] Phone: [phone]   Fax:   [phone]  Name: [name] [facility] MRN: [id number] Date of Birth: 03/13/1976

## 2019-02-11 ENCOUNTER — Ambulatory Visit: Payer: BLUE CROSS/BLUE SHIELD | Admitting: Occupational Therapy

## 2019-02-12 ENCOUNTER — Ambulatory Visit: Payer: BLUE CROSS/BLUE SHIELD | Admitting: Physical Therapy

## 2019-02-12 DIAGNOSIS — R260 Ataxic gait: Secondary | ICD-10-CM

## 2019-02-12 DIAGNOSIS — M79651 Pain in right thigh: Secondary | ICD-10-CM

## 2019-02-12 DIAGNOSIS — R278 Other lack of coordination: Secondary | ICD-10-CM

## 2019-02-12 DIAGNOSIS — R2689 Other abnormalities of gait and mobility: Secondary | ICD-10-CM

## 2019-02-12 DIAGNOSIS — R42 Dizziness and giddiness: Secondary | ICD-10-CM

## 2019-02-12 DIAGNOSIS — R2681 Unsteadiness on feet: Secondary | ICD-10-CM

## 2019-02-12 NOTE — Therapy (Signed)
[facility] [address] [place], [place], [address] Phone: [phone]   Fax:  [phone]  Physical Therapy Treatment  Patient Details  Name: [name] Barnett MRN: [id number] Date of Birth: 1976/01/16 Referring Provider (PT): Edmonia Lynch, MD   Encounter Date: 02/12/2019  PT End of Session - 02/12/19 1543    Visit Number  33    Number of Visits  43    Date for PT Re-Evaluation  03/10/19    Authorization Type  [employer]    PT Start Time  1445    PT Stop Time  1527    PT Time Calculation (min)  42 min    Equipment Utilized During Treatment  Gait belt    Activity Tolerance  Patient tolerated treatment well       Past Medical History:  Diagnosis Date  . Duodenal obstruction   . GERD (gastroesophageal reflux disease)   . Hypertension   . Stroke [facility])    april 2018, lt sided weakness  . Trimalleolar fracture of ankle, closed, right, initial encounter     Past Surgical History:  Procedure Laterality Date  . BALLOON DILATION  12/31/2011   Procedure: BALLOON DILATION;  Surgeon: Missy Sabins, MD;  Location: [facility] ENDOSCOPY;  Service: Endoscopy;  Laterality: N/A;  . CHOLECYSTECTOMY  07/28/11  . ESOPHAGOGASTRODUODENOSCOPY  10/17/2011   Procedure: ESOPHAGOGASTRODUODENOSCOPY (EGD);  Surgeon: Missy Sabins, MD;  Location: [facility] ENDOSCOPY;  Service: Endoscopy;  Laterality: N/A;  . IR ANGIO INTRA EXTRACRAN SEL INTERNAL CAROTID BILAT MOD SED  04/07/2017  . IR ANGIO INTRA EXTRACRAN SEL INTERNAL CAROTID BILAT MOD SED  01/07/2018  . IR ANGIO VERTEBRAL SEL VERTEBRAL UNI R MOD SED  04/07/2017  . IR ANGIO VERTEBRAL SEL VERTEBRAL UNI R MOD SED  01/07/2018  . IR ANGIOGRAM FOLLOW UP STUDY  04/07/2017  . IR ANGIOGRAM FOLLOW UP STUDY  04/07/2017  . IR ANGIOGRAM FOLLOW UP STUDY  04/07/2017  . IR ANGIOGRAM FOLLOW UP STUDY  04/07/2017  . IR ANGIOGRAM FOLLOW UP STUDY  04/07/2017  . IR ANGIOGRAM SELECTIVE EACH ADDITIONAL VESSEL  04/07/2017  . IR  TRANSCATH/EMBOLIZ  04/07/2017  . IR US GUIDE VASC ACCESS RIGHT  01/07/2018  . ORIF ANKLE FRACTURE Right 07/01/2018   Procedure: RIGHT ANKLE FRACTURE OPEN TREATMENT TRIMALLEOLAR INCLUDES INTERNAL FIXATION WITHOUT FIXATION OF POSTERIOR LIP, FRACTURE CLOSED TREATMENT TRIMALLEOLAR ANKLE WITH MANIPULATION, REPAIR SYNDESMOSIS, REPAIR DELTOID LIGAMENT;  Surgeon: Renette Butters, MD;  Location: [place];  Service: Orthopedics;  Laterality: Right;  . PARS PLANA VITRECTOMY Right 05/01/2017   Procedure: PARS PLANA VITRECTOMY WITH 25 GAUGE RIGHT EYE, endolaser photocoaglation;  Surgeon: Jalene Mullet, MD;  Location: [place];  Service: Ophthalmology;  Laterality: Right;  . RADIOLOGY WITH ANESTHESIA N/A 04/07/2017   Procedure: RADIOLOGY WITH ANESTHESIA;  Surgeon: Consuella Lose, MD;  Location: [place];  Service: Radiology;  Laterality: N/A;  . REPLACEMENT TOTAL KNEE BILATERAL  2004  . TEMPOROMANDIBULAR JOINT SURGERY     2 surgeries  . TUMOR REMOVAL    . VAGOTOMY  01/23/2012   Procedure: VAGOTOMY, antrectomy and BII;  Surgeon: Haywood Lasso, MD;  Location: [place];  Service: General;  Laterality: N/A;  Laparotomy with vagotomy.    There were no vitals filed for this visit.  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  Yes    Pain Score  5     Pain Location  Groin    Pain Orientation  Right    Pain Descriptors / Indicators  Sore    Pain Type  Neuropathic pain    Pain Onset  More than a month ago      Therex and neuro rehab performed today:  SLS modified with foot up on cabinet below counter, 30 sec X 3 on Rt and 10  sec avg on Left X 8 total.   Heel taps to cabinet below counter with one UE support X 20 bilat  Gait with SPC for turns around hula hoop X 3 CW and X 3 CCW then gait with SPC for 115 feet but poor use of cane appropriately.  In // bars on airex pad balance with feet apart 1 min X 3, then airex pad step ups X 15 bilat with UE support.    PT Short Term Goals - 02/06/19 1017      PT SHORT TERM GOAL #1   Title  Pt will report independence and compliance with land HEP;  continue participation in aquatic therapy with aquatic HEP to be established    Baseline  met per pt report - pt continues to participate in aquatic therapy with OT - 02-05-19    Status  Achieved      PT SHORT TERM GOAL #2   Title  Pt will improve gait speed to >/= 2.3 ft/esc indicative of community ambulation with rollator    Baseline  2.17 ft/sec with rollator on 01-05-19:  2 trials of gait speed tested - 14.00 secs = 2.34 ft/sec & 12.78 secs = 2.57 ft/sec withh rollator - 02-05-19    Status  Achieved      PT SHORT TERM GOAL #3   Title  Pt will improve Berg score to >/= 45/56 indicating decreased fall risk potential     Baseline  43/56 on 01-05-19; Berg score 38/56 (5 point decrease from 01-05-19) on 02-05-19    Status  Not Met      PT SHORT TERM GOAL #4   Title  Pt will improve TUG score to >/= 17 seconds indicating decreased risk for falls and improvement in functional mobility using rollator    Baseline  19.91 secs on 01-05-19; 2 trials of TUG completed:  17.34 secs, 17.22 secs - improvement with score but not fully met stated goal - 02-05-19; instability noted with turn with fall risk noted during TUG today    Status  Partially Met      PT SHORT TERM GOAL #5   Title  Pt will report </= 5/10 pain in R hip during exercises, WB and gait    Baseline  12/08/18: 8/10 before and after session; 6/10 pain intensity reported on 02-05-19 in Lt groin with pt scheduled for nerve block on 02-11-19    Status  Not Met        PT Long  Term Goals - 01/08/19 1527      PT LONG TERM GOAL #1   Title  Pt will demonstrate independence with land and aquatic HEP    Baseline  Land HEP is established; aquatic HEP is on-going as pt  is currently participating in aquatic therapy with OT    Time  8    Period  Weeks    Status  On-going    Target Date  03/10/19      PT LONG TERM GOAL #2   Title  Pt will improve Berg score to >/= 47/56 to decrease fall risk potential - CONTINUE this LTG    Baseline  43/56 on 01-05-19    Time  8    Period  Weeks    Status  Partially Met    Target Date  03/10/19      PT LONG TERM GOAL #3   Title  Pt will improve gait speed to >/=2.5 ft/sec indicating significant change in functional mobility with LRAD: REVISED 01-08-19:  Improve gait velocity to >/= 2.4 ft/sec with use of rollator for incr. gati efficiency.    Baseline  1.9 with RW, 2.17 with rollator    Time  8    Period  Weeks    Status  Not Met   REVISED   Target Date  03/10/19      PT LONG TERM GOAL #4   Title  Pt will improve TUG score to </= 13.5 seconds using LRAD indicating decreased fall risk potential ; REVISED to </= 15 secs with rollator     Baseline  23.37 with rolling walker, 19.91 with rollator    Time  8    Status  Not Met   REVISED   Target Date  03/10/19      PT LONG TERM GOAL #5   Title  Pt will negotiate 12 steps using B HR's and alternating stepping technique with supervision to improve community accessibility     Baseline  met 11-17-18    Status  Achieved      PT LONG TERM GOAL #6   Title  Pt will report ability to perform household ambulation using her RW with modified independence demonstrating significant improvement in functional mobility     Baseline  met 01-08-19; pt now has obtained rollator for assistance with ambulation    Status  Achieved      PT LONG TERM GOAL #7   Title  Pt will ambulate outdoors over paved surfaces and perform curbs and inclines with supervision using LRAD (ROLLATOR) for 300 feet to improve  functional mobility with community distances     Time  8    Period  Weeks    Status  On-going    Target Date  03/10/19      PT LONG TERM GOAL #8   Title  Pt will ambulate over level surfaces with her RW for 230 feet with supervision using LRAD demonstrating improvement in functional mobility     Status  Achieved            Plan - 02/12/19 1543    Clinical Impression Statement  Session focused on activity tolerance, balance, and ambulation with SPC with focus on turning. She still has difficulty with SLS, turning, and balancing on foam surfaces today requiring a range from min to total A to keep from falling. She was somewhat limited by hip soreness today as well. PT wil continue to progress as able.    Rehab Potential  Good    Clinical Impairments Affecting Rehab Potential  high risk for falls due to abnormal gait, ataxia, sensory changes, and impaired cognition.     PT Frequency  2x / week    PT Duration  8 weeks  PT Treatment/Interventions  ADLs/Self Care Home Management;Aquatic Therapy;DME Instruction;Gait training;Stair training;Functional mobility training;Therapeutic activities;Therapeutic exercise;Balance training;Neuromuscular re-education;Patient/family education;Cognitive remediation;Orthotic Fit/Training;Electrical Stimulation;Cryotherapy;Moist Heat;Manual techniques;Passive range of motion;Taping;Energy conservation;Dry needling;Vestibular    PT Next Visit Plan  DID NERVE BLOCK HELP WITH PAIN in R HIP?   Continue to revise HEP.  Continue to work on turning/pivoting with one UE support, partial SLS.   Balance with narrow BOS.  Continue to progress x 1 viewing.  Gait training with rollator (10 lb in basket). proximal stability and core activation in sidelying/supine (pilates?), tall kneeling (trunk stability during UE movement/resistance with theraband) and standing, focus on decreased use of lumbar extensors during gait, cont to work on balance, gait and trunk stabilization  activities     PT Home Exercise Plan  Access Code: [email or web address]     Consulted and Agree with Plan of Care  Patient;Family member/caregiver    Family Member Consulted  husband       Patient will benefit from skilled therapeutic intervention in order to improve the following deficits and impairments:  Abnormal gait, Decreased balance, Decreased cognition, Decreased coordination, Decreased strength, Difficulty walking, Impaired sensation, Pain, Decreased activity tolerance, Decreased mobility, Decreased endurance, Postural dysfunction, Decreased knowledge of use of DME, Impaired perceived functional ability, Decreased range of motion, Impaired flexibility, Dizziness  Visit Diagnosis: Ataxic gait  Other lack of coordination  Other abnormalities of gait and mobility  Unsteadiness on feet  Pain in right thigh  Dizziness and giddiness     Problem List Patient Active Problem List   Diagnosis Date Noted  . Trimalleolar fracture of ankle, closed, right, initial encounter 11/27/2018  . Tobacco use disorder 11/27/2018  . Osteoarthritis of right hip 10/07/2018  . 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 Weakness with dizziness-  since [facility] and CVA 04/07/17 08/07/2017  . Disturbances of vision, late effect of stroke 08/06/2017  . Health education/counseling 08/05/2017  . High risk medications (not anticoagulants) long-term use 08/05/2017  . Neuritis-  R sided:  arm and leg/ body due to stroke 08/05/2017  . Elevated LDL cholesterol level 07/11/2017  . [employer] (NIH) Stroke Scale limb ataxia score 2, ataxia present in two limbs 06/25/2017  . 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 Elevated vitamin B12 level 05/30/2017  . Vitamin D deficiency 05/29/2017  . History of tobacco abuse-  30pk yr hx - quit 04/07/17 05/21/2017  . Gait disturbance, post-stroke 05/14/2017  . Benign essential HTN   . Vitreous hemorrhage of right eye ([place])   . Adjustment disorder with  mixed anxiety and depressed mood   . 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 s/p SAH (subarachnoid hemorrhage) ([place]) 04/19/2017  . 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 Hypoxia   . Subarachnoid hemorrhage due to ruptured aneurysm ([place]) 04/07/2017  . CVA (cerebral vascular accident) ([place]) 04/07/2017  . Elevated gastrin level 01/25/2012  . Hypokalemia 01/21/2012  . Nausea & vomiting 01/20/2012  . Epigastric pain 01/20/2012  . Duodenal ulcer, acute with obstruction 10/17/2011  . S/P laparoscopic cholecystectomy 10/16/2011    Silvestre Mesi 02/12/2019, 3:48 PM  [facility] [address] [place], [place], [address] Phone: [phone]   Fax:  [phone]  Name: [name] Barnett MRN: [id number] Date of Birth: November 30, 1976

## 2019-02-16 ENCOUNTER — Ambulatory Visit: Payer: BLUE CROSS/BLUE SHIELD | Admitting: Physical Therapy

## 2019-02-16 ENCOUNTER — Encounter: Payer: Self-pay | Admitting: Physical Therapy

## 2019-02-16 DIAGNOSIS — R2681 Unsteadiness on feet: Secondary | ICD-10-CM

## 2019-02-16 DIAGNOSIS — R278 Other lack of coordination: Secondary | ICD-10-CM

## 2019-02-16 DIAGNOSIS — R42 Dizziness and giddiness: Secondary | ICD-10-CM

## 2019-02-16 DIAGNOSIS — M79651 Pain in right thigh: Secondary | ICD-10-CM

## 2019-02-16 DIAGNOSIS — R2689 Other abnormalities of gait and mobility: Secondary | ICD-10-CM

## 2019-02-16 DIAGNOSIS — R260 Ataxic gait: Secondary | ICD-10-CM | POA: Diagnosis not present

## 2019-02-16 NOTE — Patient Instructions (Signed)
For Letter Exercise:  Perform in standing with feet apart 60 seconds each.  Feet together 60 seconds each

## 2019-02-16 NOTE — Therapy (Signed)
[facility] [address] [place], [place], [address] Phone: [phone]   Fax:  [phone]  Physical Therapy Treatment  Patient Details  Name: [name] Barnett MRN: [id number] Date of Birth: 11/22/1976 Referring Provider (PT): Edmonia Lynch, MD   Encounter Date: 02/16/2019  PT End of Session - 02/16/19 1612    Visit Number  34    Number of Visits  43    Date for PT Re-Evaluation  03/10/19    Authorization Type  [employer]    PT Start Time  1450    PT Stop Time  1535    PT Time Calculation (min)  45 min    Activity Tolerance  Patient tolerated treatment well    Behavior During Therapy  [facility] for tasks assessed/performed       Past Medical History:  Diagnosis Date  . Duodenal obstruction   . GERD (gastroesophageal reflux disease)   . Hypertension   . Stroke [facility])    april 2018, lt sided weakness  . Trimalleolar fracture of ankle, closed, right, initial encounter     Past Surgical History:  Procedure Laterality Date  . BALLOON DILATION  12/31/2011   Procedure: BALLOON DILATION;  Surgeon: Missy Sabins, MD;  Location: [facility] ENDOSCOPY;  Service: Endoscopy;  Laterality: N/A;  . CHOLECYSTECTOMY  07/28/11  . ESOPHAGOGASTRODUODENOSCOPY  10/17/2011   Procedure: ESOPHAGOGASTRODUODENOSCOPY (EGD);  Surgeon: Missy Sabins, MD;  Location: [facility] ENDOSCOPY;  Service: Endoscopy;  Laterality: N/A;  . IR ANGIO INTRA EXTRACRAN SEL INTERNAL CAROTID BILAT MOD SED  04/07/2017  . IR ANGIO INTRA EXTRACRAN SEL INTERNAL CAROTID BILAT MOD SED  01/07/2018  . IR ANGIO VERTEBRAL SEL VERTEBRAL UNI R MOD SED  04/07/2017  . IR ANGIO VERTEBRAL SEL VERTEBRAL UNI R MOD SED  01/07/2018  . IR ANGIOGRAM FOLLOW UP STUDY  04/07/2017  . IR ANGIOGRAM FOLLOW UP STUDY  04/07/2017  . IR ANGIOGRAM FOLLOW UP STUDY  04/07/2017  . IR ANGIOGRAM FOLLOW UP STUDY  04/07/2017  . IR ANGIOGRAM FOLLOW UP STUDY  04/07/2017  . IR ANGIOGRAM SELECTIVE EACH ADDITIONAL VESSEL  04/07/2017  . IR  TRANSCATH/EMBOLIZ  04/07/2017  . IR US GUIDE VASC ACCESS RIGHT  01/07/2018  . ORIF ANKLE FRACTURE Right 07/01/2018   Procedure: RIGHT ANKLE FRACTURE OPEN TREATMENT TRIMALLEOLAR INCLUDES INTERNAL FIXATION WITHOUT FIXATION OF POSTERIOR LIP, FRACTURE CLOSED TREATMENT TRIMALLEOLAR ANKLE WITH MANIPULATION, REPAIR SYNDESMOSIS, REPAIR DELTOID LIGAMENT;  Surgeon: Renette Butters, MD;  Location: [facility];  Service: Orthopedics;  Laterality: Right;  . PARS PLANA VITRECTOMY Right 05/01/2017   Procedure: PARS PLANA VITRECTOMY WITH 25 GAUGE RIGHT EYE, endolaser photocoaglation;  Surgeon: Jalene Mullet, MD;  Location: [place];  Service: Ophthalmology;  Laterality: Right;  . RADIOLOGY WITH ANESTHESIA N/A 04/07/2017   Procedure: RADIOLOGY WITH ANESTHESIA;  Surgeon: Consuella Lose, MD;  Location: [place];  Service: Radiology;  Laterality: N/A;  . REPLACEMENT TOTAL KNEE BILATERAL  2004  . TEMPOROMANDIBULAR JOINT SURGERY     2 surgeries  . TUMOR REMOVAL    . VAGOTOMY  01/23/2012   Procedure: VAGOTOMY, antrectomy and BII;  Surgeon: Haywood Lasso, MD;  Location: [place];  Service: General;  Laterality: N/A;  Laparotomy with vagotomy.    There were no vitals filed for this visit.  Subjective Assessment - 02/16/19 1456    Subjective  Pt had soreness from injection for a day.  Has second nerve block on April 2nd.  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  Yes    Pain Onset  More than a month ago                        Vestibular Treatment/Exercise - 02/16/19 1500      Vestibular Treatment/Exercise   Vestibular  Treatment Provided  Habituation;Gaze    Habituation Exercises  Standing Horizontal Head Turns;Seated Diagonal Head Turns    Gaze Exercises  X1 Viewing Horizontal;X1 Viewing Vertical      Seated Diagonal Head Turns   Number of Reps  10    Symptoms Description   down and to the L, up and to the R combined with reaching down to the floor on L, up and R to target while holding 3.3lb medicine ball.  No dizziness in seated      Standing Horizontal Head Turns   Number of Reps   10    Symptom Description   to R and L combined with UE reaching down and to the L, up and to the R holding 3.3lb medicine ball and then performing 10 reps down and to the R, up and to the L.  Pt did not tolerate down L, up R due to dizziness.  Transitioned to seated      X1 Viewing Horizontal   Foot Position  standing feet apart, feet together and R/L staggered stance    Reps  4    Comments  60 seconds, no UE support      X1 Viewing Vertical   Foot Position  standing feet apart, feet together and R/L staggered stance    Reps  4    Comments  60 seconds, no UE support       [facility]) - 02/16/19 1610    PWR! exercises  Moves in sitting    PWR! Twist  x 10 reps seated on rolling stool focusing on weight shift and control when turning/pivoting          PT Education - 02/16/19 1611    Education provided  Yes    Education Details  progressed x 1 viewing in standing    Person(s) Educated  Patient;Spouse    Methods  Explanation;Demonstration    Comprehension  Verbalized understanding;Returned demonstration       PT Short Term Goals - 02/06/19 1017      PT SHORT TERM GOAL #1   Title  Pt will report independence and compliance with land HEP;  continue participation in aquatic therapy with aquatic HEP to be established    Baseline  met per pt report - pt continues to participate in aquatic therapy with OT - 02-05-19    Status  Achieved      PT SHORT TERM GOAL #2   Title  Pt will improve gait speed to >/= 2.3  ft/esc indicative of community ambulation with rollator    Baseline  2.17 ft/sec with rollator on 01-05-19:  2 trials of gait speed tested - 14.00 secs = 2.34 ft/sec & 12.78 secs = 2.57 ft/sec withh rollator - 02-05-19    Status  Achieved      PT SHORT TERM GOAL #3   Title  Pt will improve Berg score to >/= 45/56 indicating decreased fall risk potential     Baseline  43/56 on 01-05-19; Berg score 38/56 (5 point decrease from 01-05-19) on 02-05-19    Status  Not Met      PT SHORT TERM GOAL #4   Title  Pt will improve TUG score to >/= 17 seconds indicating decreased risk for falls and improvement in functional mobility using rollator    Baseline  19.91 secs on 01-05-19; 2 trials of TUG completed:  17.34 secs, 17.22 secs - improvement with score but not fully met stated goal - 02-05-19; instability noted with turn with fall risk noted during TUG today    Status  Partially Met      PT SHORT TERM GOAL #5   Title  Pt will report </= 5/10 pain in R hip during exercises, WB and gait    Baseline  12/08/18: 8/10 before and after session; 6/10 pain intensity reported on 02-05-19 in Lt groin with pt scheduled for nerve block on 02-11-19    Status  Not Met        PT Long Term Goals - 01/08/19 1527      PT LONG TERM GOAL #1   Title  Pt will demonstrate independence with land and aquatic HEP    Baseline  Land HEP is established; aquatic HEP is on-going as pt is currently participating in aquatic therapy with OT    Time  8    Period  Weeks    Status  On-going    Target Date  03/10/19      PT LONG TERM GOAL #2   Title  Pt will improve Berg score to >/= 47/56 to decrease fall risk potential - CONTINUE this LTG    Baseline  43/56 on 01-05-19    Time  8    Period  Weeks    Status  Partially Met    Target Date  03/10/19      PT LONG TERM GOAL #3   Title  Pt will improve gait speed to >/=2.5 ft/sec indicating significant change in functional mobility with LRAD: REVISED 01-08-19:  Improve gait velocity to >/=  2.4 ft/sec with use of rollator for incr. gati efficiency.    Baseline  1.9 with RW, 2.17 with rollator    Time  8    Period  Weeks    Status  Not Met   REVISED   Target Date  03/10/19      PT LONG TERM GOAL #4   Title  Pt will improve TUG score to </= 13.5 seconds using LRAD indicating decreased fall risk potential ; REVISED to </= 15 secs with rollator     Baseline  23.37 with rolling walker, 19.91 with rollator    Time  8    Status  Not Met   REVISED   Target Date  03/10/19      PT LONG TERM GOAL #5   Title  Pt will negotiate 12 steps using B HR's and alternating stepping technique with supervision to improve community accessibility     Baseline  met 11-17-18    Status  Achieved      PT LONG TERM GOAL #6   Title  Pt will report ability to perform household ambulation using her RW with modified independence demonstrating significant improvement in functional mobility     Baseline  met 01-08-19;  pt now has obtained rollator for assistance with ambulation    Status  Achieved      PT LONG TERM GOAL #7   Title  Pt will ambulate outdoors over paved surfaces and perform curbs and inclines with supervision using LRAD (ROLLATOR) for 300 feet to improve functional mobility with community distances     Time  8    Period  Weeks    Status  On-going    Target Date  03/10/19      PT LONG TERM GOAL #8   Title  Pt will ambulate over level surfaces with her RW for 230 feet with supervision using LRAD demonstrating improvement in functional mobility     Status  Achieved            Plan - 02/16/19 1612    Clinical Impression Statement  Treatment session focused on progression of vestibular gaze adaptation and habituation exercises in standing and incorporating rotation (vision, head, trunk, feet) into various movements to continue to address instability when turning/pivoting.  Pt continues to experience dizziness when turning to R.  Will continue to address in order to progress towards LTG.     Rehab Potential  Good    Clinical Impairments Affecting Rehab Potential  high risk for falls due to abnormal gait, ataxia, sensory changes, and impaired cognition.     PT Frequency  2x / week    PT Duration  8 weeks    PT Treatment/Interventions  ADLs/Self Care Home Management;Aquatic Therapy;DME Instruction;Gait training;Stair training;Functional mobility training;Therapeutic activities;Therapeutic exercise;Balance training;Neuromuscular re-education;Patient/family education;Cognitive remediation;Orthotic Fit/Training;Electrical Stimulation;Cryotherapy;Moist Heat;Manual techniques;Passive range of motion;Taping;Energy conservation;Dry needling;Vestibular    PT Next Visit Plan  Continue to revise HEP.  Continue to work on turning/pivoting with one UE support, partial SLS.   Balance with narrow BOS.  Continue to progress x 1 viewing - staggered stance.  reaching in diagonals with head turns in standing.  proximal stability and core activation in sidelying/supine (pilates?), tall kneeling (trunk stability during UE movement/resistance with theraband) and standing, focus on decreased use of lumbar extensors during gait, cont to work on balance, gait and trunk stabilization activities     PT Home Exercise Plan  Access Code: [email or web address]     Consulted and Agree with Plan of Care  Patient;Family member/caregiver    Family Member Consulted  husband       Patient will benefit from skilled therapeutic intervention in order to improve the following deficits and impairments:  Abnormal gait, Decreased balance, Decreased cognition, Decreased coordination, Decreased strength, Difficulty walking, Impaired sensation, Pain, Decreased activity tolerance, Decreased mobility, Decreased endurance, Postural dysfunction, Decreased knowledge of use of DME, Impaired perceived functional ability, Decreased range of motion, Impaired flexibility, Dizziness  Visit Diagnosis: Ataxic gait  Other lack of coordination  Other  abnormalities of gait and mobility  Unsteadiness on feet  Pain in right thigh  Dizziness and giddiness     Problem List Patient Active Problem List   Diagnosis Date Noted  . Trimalleolar fracture of ankle, closed, right, initial encounter 11/27/2018  . Tobacco use disorder 11/27/2018  . Osteoarthritis of right hip 10/07/2018  . 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 Weakness with dizziness-  since [facility] and CVA 04/07/17 08/07/2017  . Disturbances of vision, late effect of stroke 08/06/2017  . Health education/counseling 08/05/2017  . High risk medications (not anticoagulants) long-term use 08/05/2017  . Neuritis-  R sided:  arm and leg/ body due to stroke 08/05/2017  . Elevated LDL cholesterol level 07/11/2017  .  [employer] (NIH) Stroke Scale limb ataxia score 2, ataxia present in two limbs 06/25/2017  . 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 Elevated vitamin B12 level 05/30/2017  . Vitamin D deficiency 05/29/2017  . History of tobacco abuse-  30pk yr hx - quit 04/07/17 05/21/2017  . Gait disturbance, post-stroke 05/14/2017  . Benign essential HTN   . Vitreous hemorrhage of right eye ([place])   . Adjustment disorder with mixed anxiety and depressed mood   . 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 s/p SAH (subarachnoid hemorrhage) ([place]) 04/19/2017  . 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 Hypoxia   . Subarachnoid hemorrhage due to ruptured aneurysm ([place]) 04/07/2017  . CVA (cerebral vascular accident) ([place]) 04/07/2017  . Elevated gastrin level 01/25/2012  . Hypokalemia 01/21/2012  . Nausea & vomiting 01/20/2012  . Epigastric pain 01/20/2012  . Duodenal ulcer, acute with obstruction 10/17/2011  . S/P laparoscopic cholecystectomy 10/16/2011    Rico Junker, PT, DPT 02/16/19    4:18 PM    [facility] [address] [place], [place], [address] Phone: [phone]   Fax:  [phone]  Name: [name] Barnett MRN: [id number] Date of Birth: 1975-12-12

## 2019-02-18 ENCOUNTER — Encounter: Payer: Self-pay | Admitting: Occupational Therapy

## 2019-02-18 ENCOUNTER — Other Ambulatory Visit: Payer: Self-pay

## 2019-02-18 ENCOUNTER — Ambulatory Visit: Payer: BLUE CROSS/BLUE SHIELD | Admitting: Occupational Therapy

## 2019-02-18 DIAGNOSIS — R208 Other disturbances of skin sensation: Secondary | ICD-10-CM

## 2019-02-18 DIAGNOSIS — M6281 Muscle weakness (generalized): Secondary | ICD-10-CM

## 2019-02-18 DIAGNOSIS — R293 Abnormal posture: Secondary | ICD-10-CM

## 2019-02-18 DIAGNOSIS — I69018 Other symptoms and signs involving cognitive functions following nontraumatic subarachnoid hemorrhage: Secondary | ICD-10-CM

## 2019-02-18 DIAGNOSIS — I69054 Hemiplegia and hemiparesis following nontraumatic subarachnoid hemorrhage affecting left non-dominant side: Secondary | ICD-10-CM

## 2019-02-18 DIAGNOSIS — R260 Ataxic gait: Secondary | ICD-10-CM | POA: Diagnosis not present

## 2019-02-18 DIAGNOSIS — R41840 Attention and concentration deficit: Secondary | ICD-10-CM

## 2019-02-18 DIAGNOSIS — R41842 Visuospatial deficit: Secondary | ICD-10-CM

## 2019-02-18 DIAGNOSIS — R2681 Unsteadiness on feet: Secondary | ICD-10-CM

## 2019-02-18 DIAGNOSIS — R270 Ataxia, unspecified: Secondary | ICD-10-CM

## 2019-02-18 NOTE — Therapy (Signed)
[facility] [address] [place], [place], [address] Phone: [phone]   Fax:  [phone]  Occupational Therapy Treatment  Patient Details  Name: [name] Barnett MRN: [id number] Date of Birth: 07/15/1976 Referring Provider (OT): Dr. Alysia Penna   Encounter Date: 02/18/2019  OT End of Session - 02/18/19 1939    Visit Number  42    Number of Visits  60    Date for OT Re-Evaluation  03/04/19    Authorization Type  BCBS, no visit limit/auth req.    OT Start Time  1502    OT Stop Time  1545    OT Time Calculation (min)  43 min    Activity Tolerance  Patient tolerated treatment well       Past Medical History:  Diagnosis Date  . Duodenal obstruction   . GERD (gastroesophageal reflux disease)   . Hypertension   . Stroke [facility])    april 2018, lt sided weakness  . Trimalleolar fracture of ankle, closed, right, initial encounter     Past Surgical History:  Procedure Laterality Date  . BALLOON DILATION  12/31/2011   Procedure: BALLOON DILATION;  Surgeon: Missy Sabins, MD;  Location: [facility] ENDOSCOPY;  Service: Endoscopy;  Laterality: N/A;  . CHOLECYSTECTOMY  07/28/11  . ESOPHAGOGASTRODUODENOSCOPY  10/17/2011   Procedure: ESOPHAGOGASTRODUODENOSCOPY (EGD);  Surgeon: Missy Sabins, MD;  Location: [facility] ENDOSCOPY;  Service: Endoscopy;  Laterality: N/A;  . IR ANGIO INTRA EXTRACRAN SEL INTERNAL CAROTID BILAT MOD SED  04/07/2017  . IR ANGIO INTRA EXTRACRAN SEL INTERNAL CAROTID BILAT MOD SED  01/07/2018  . IR ANGIO VERTEBRAL SEL VERTEBRAL UNI R MOD SED  04/07/2017  . IR ANGIO VERTEBRAL SEL VERTEBRAL UNI R MOD SED  01/07/2018  . IR ANGIOGRAM FOLLOW UP STUDY  04/07/2017  . IR ANGIOGRAM FOLLOW UP STUDY  04/07/2017  . IR ANGIOGRAM FOLLOW UP STUDY  04/07/2017  . IR ANGIOGRAM FOLLOW UP STUDY  04/07/2017  . IR ANGIOGRAM FOLLOW UP STUDY  04/07/2017  . IR ANGIOGRAM SELECTIVE EACH ADDITIONAL VESSEL  04/07/2017  . IR TRANSCATH/EMBOLIZ  04/07/2017  .  IR US GUIDE VASC ACCESS RIGHT  01/07/2018  . ORIF ANKLE FRACTURE Right 07/01/2018   Procedure: RIGHT ANKLE FRACTURE OPEN TREATMENT TRIMALLEOLAR INCLUDES INTERNAL FIXATION WITHOUT FIXATION OF POSTERIOR LIP, FRACTURE CLOSED TREATMENT TRIMALLEOLAR ANKLE WITH MANIPULATION, REPAIR SYNDESMOSIS, REPAIR DELTOID LIGAMENT;  Surgeon: Renette Butters, MD;  Location: [facility];  Service: Orthopedics;  Laterality: Right;  . PARS PLANA VITRECTOMY Right 05/01/2017   Procedure: PARS PLANA VITRECTOMY WITH 25 GAUGE RIGHT EYE, endolaser photocoaglation;  Surgeon: Jalene Mullet, MD;  Location: [place];  Service: Ophthalmology;  Laterality: Right;  . RADIOLOGY WITH ANESTHESIA N/A 04/07/2017   Procedure: RADIOLOGY WITH ANESTHESIA;  Surgeon: Consuella Lose, MD;  Location: [place];  Service: Radiology;  Laterality: N/A;  . REPLACEMENT TOTAL KNEE BILATERAL  2004  . TEMPOROMANDIBULAR JOINT SURGERY     2 surgeries  . TUMOR REMOVAL    . VAGOTOMY  01/23/2012   Procedure: VAGOTOMY, antrectomy and BII;  Surgeon: Haywood Lasso, MD;  Location: [facility];  Service: General;  Laterality: N/A;  Laparotomy with vagotomy.    There were no vitals filed for this visit.  Subjective Assessment - 02/18/19 1936    Subjective   I know I am all over the place today and not paying attention    Patient is accompanied by:  Family member   mom   Pertinent  History  Subarachnoid hemorrhage due to ruptured aneurysm, ataxia (L cerebellar); HTN, R side decr sensation, vertical diplopia/visual deficits, ataxia, Bilateral retinal hemorhage associated with SAH (with surgery on R eye in hospital and L approx 1 month ago)    Limitations  visual deficits, fall risk, impulsivity/cognitive deficits    Patient Stated Goals  I want to be able to walk without assistance so I can do all the things I need to do as a wife and mom, use my hand better.     Currently in Pain?  Yes    Pain Score  4     Pain Location  Groin    Pain Orientation   Right    Pain Descriptors / Indicators  Sore    Pain Type  Neuropathic pain    Pain Onset  More than a month ago    Pain Frequency  Constant    Aggravating Factors   sitting too long    Pain Relieving Factors  movement helps, stretching, meds, being in the water          Patient seen for aquatic therapy today.  Treatment took place in water 2.5-4 feet deep depending upon activity.  Pt entered the pool via steps using 2 hand rails, mod cues for attention, hand placement and impulsivity as well as postural alignment and control - pt also needed contact guard.  Treatment session focused on functional ambulation using water walker with max cues for pt to decrease speed, increase step length and focus on activity. Treatment also focused on safe turns - pt needed min assist for functional ambulation and close supervision for turns to the R, contact guard for turns L.  Pt very distracted today with heightened sense of disorganization. Practiced deep breathing as well as utilized deep water for calming effect to decrease anxiety, decrease impulsivity and improve ability to focus.  Addressed controlled movements for side stepping with BUE support left and right, forward and backward walking to address LE hip extension, core strength, postural alignment and control and dynamic standing balance. Pt needed min a and max cues for all activities.  Also addressed balance, alignment and LE strengthening with marching with small noodle each leg with min a. Pt needs heavy proprioceptive input, cues and min a for postural alignment with LLE when using LLE as stabilizer while moving RLE.  Utilized flotation device on RLE as LLE is more buoyant(due to less muscle mass) to improve midline control.  Pt exited pool via steps using 2 hand rails, very close supervision and mod cues for impulsivity and focus.                     OT Short Term Goals - 02/18/19 1937      OT SHORT TERM GOAL #1   Title  Pt will  verbalize understanding of basic water principles in prep for aquatic HEP - 02/04/2019    Status  On-going      OT SHORT TERM GOAL #7   Status  --   inconsistent.  01/09/18 met at approx this level       OT Long Term Goals - 02/18/19 1937      OT LONG TERM GOAL #1   Title  Pt and family willl be mod I with aquatic HEP (for use when pt has access to pool) -03/04/2019 (renewed)    Status  On-going      OT LONG TERM GOAL #2   Title  Pt will demonstrate  improved postural alignment and control in standing as demonstrated by ability to do light home mgmt tasks in standing (such as fold laundry)    Status  On-going      OT LONG TERM GOAL #3   Title  Pt will report greater ease in using LUE for simple home mgmt tasks (such as unloading dishwasher into [employer]) .     Status  On-going      OT LONG TERM GOAL #4   Title  Pt will be mod I using RW during simple home mgmt tasks at static standing level (i.e standing at sink to wash dishes).      Status  On-going            Plan - 02/18/19 1938    Clinical Impression Statement  Pt returns to aquatic therapy after several missed sessions - pt was sick for 2 weeks and then had nerve block to leg and is just now medically cleared to return to aquatic  therapy.     Occupational Profile and client history currently impacting functional performance  Pt is a 43 y.o. female s/p subarachnoid hemorrhage due to ruptured aneurysm, L cerebellar infarct with ataxia.  Pt was working full time and indpendent prior to hospitalization.  Pt lives with husband and cared for 54 y.o. daughter.  Medical history includes HTN, GERD, and Bilateral retinal hemorhage associated with SAH (needed surgery).      Occupational performance deficits (Please refer to evaluation for details):  ADL's;IADL's;Leisure;Social Participation;Work    Rehab Potential  Good    OT Frequency  1x / week    OT Duration  8 weeks    OT Treatment/Interventions  Self-care/ADL  training;Therapeutic exercise;Therapeutic activities;Cognitive remediation/compensation;Passive range of motion;Functional Mobility Training;Neuromuscular education;Energy conservation;Manual Therapy;Patient/family education;Balance training;Aquatic Therapy    Plan  aquatic therapy treatment relative to balance, postural alignment and control, functional ambulation, UE and LE strengthening    Consulted and Agree with Plan of Care  Patient;Family member/caregiver    Family Member Consulted  mother       Patient will benefit from skilled therapeutic intervention in order to improve the following deficits and impairments:     Visit Diagnosis: Unsteadiness on feet  Muscle weakness (generalized)  Ataxia  Abnormal posture  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 Trimalleolar fracture of ankle, closed, right, initial encounter 11/27/2018  . Tobacco use disorder 11/27/2018  . Osteoarthritis of right hip 10/07/2018  . 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 Weakness with dizziness-  since [facility] and CVA 04/07/17 08/07/2017  . Disturbances of vision, late effect of stroke 08/06/2017  . Health education/counseling 08/05/2017  . High risk medications (not anticoagulants) long-term use 08/05/2017  . Neuritis-  R sided:  arm and leg/ body due to stroke 08/05/2017  . Elevated LDL cholesterol level 07/11/2017  . [employer] (NIH) Stroke Scale limb ataxia score 2, ataxia present in two limbs 06/25/2017  . 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 Elevated vitamin B12 level 05/30/2017  . Vitamin D deficiency 05/29/2017  .  History of tobacco abuse-  30pk yr hx - quit 04/07/17 05/21/2017  . Gait disturbance, post-stroke 05/14/2017  . Benign essential HTN   .  Vitreous hemorrhage of right eye ([place])   . Adjustment disorder with mixed anxiety and depressed mood   . 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 s/p SAH (subarachnoid hemorrhage) ([place]) 04/19/2017  . 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 Hypoxia   . Subarachnoid hemorrhage due to ruptured aneurysm ([place]) 04/07/2017  . CVA (cerebral vascular accident) ([place]) 04/07/2017  . Elevated gastrin level 01/25/2012  . Hypokalemia 01/21/2012  . Nausea & vomiting 01/20/2012  . Epigastric pain 01/20/2012  . Duodenal ulcer, acute with obstruction 10/17/2011  . S/P laparoscopic cholecystectomy 10/16/2011    Quay Burow, OTR/L 02/18/2019, 7:41 PM  [facility] [address] [place], [place], [address] Phone: [phone]   Fax:  [phone]  Name: [name] Barnett MRN: [id number] Date of Birth: [birth year]

## 2019-02-19 ENCOUNTER — Ambulatory Visit: Payer: BLUE CROSS/BLUE SHIELD | Admitting: Physical Therapy

## 2019-02-23 ENCOUNTER — Ambulatory Visit: Payer: BLUE CROSS/BLUE SHIELD | Admitting: Physical Therapy

## 2019-02-23 ENCOUNTER — Encounter: Payer: Self-pay | Admitting: Physical Therapy

## 2019-02-23 DIAGNOSIS — R278 Other lack of coordination: Secondary | ICD-10-CM

## 2019-02-23 DIAGNOSIS — R260 Ataxic gait: Secondary | ICD-10-CM

## 2019-02-23 DIAGNOSIS — R42 Dizziness and giddiness: Secondary | ICD-10-CM

## 2019-02-23 DIAGNOSIS — R2689 Other abnormalities of gait and mobility: Secondary | ICD-10-CM

## 2019-02-23 DIAGNOSIS — R2681 Unsteadiness on feet: Secondary | ICD-10-CM

## 2019-02-23 NOTE — Therapy (Signed)
[facility] [address] [place], [place], [address] Phone: [phone]   Fax:  [phone]  Physical Therapy Treatment  Patient Details  Name: [name] Barnett MRN: [id number] Date of Birth: 12-08-1976 Referring Provider (PT): Edmonia Lynch, MD   Encounter Date: 02/23/2019  PT End of Session - 02/23/19 1536    Visit Number  35    Number of Visits  43    Date for PT Re-Evaluation  03/10/19    Authorization Type  BCBS    PT Start Time  1449    PT Stop Time  1535    PT Time Calculation (min)  46 min    Activity Tolerance  Patient tolerated treatment well    Behavior During Therapy  [facility] for tasks assessed/performed       Past Medical History:  Diagnosis Date  . Duodenal obstruction   . GERD (gastroesophageal reflux disease)   . Hypertension   . Stroke [facility])    april 2018, lt sided weakness  . Trimalleolar fracture of ankle, closed, right, initial encounter     Past Surgical History:  Procedure Laterality Date  . BALLOON DILATION  12/31/2011   Procedure: BALLOON DILATION;  Surgeon: Missy Sabins, MD;  Location: [facility] ENDOSCOPY;  Service: Endoscopy;  Laterality: N/A;  . CHOLECYSTECTOMY  07/28/11  . ESOPHAGOGASTRODUODENOSCOPY  10/17/2011   Procedure: ESOPHAGOGASTRODUODENOSCOPY (EGD);  Surgeon: Missy Sabins, MD;  Location: [facility] ENDOSCOPY;  Service: Endoscopy;  Laterality: N/A;  . IR ANGIO INTRA EXTRACRAN SEL INTERNAL CAROTID BILAT MOD SED  04/07/2017  . IR ANGIO INTRA EXTRACRAN SEL INTERNAL CAROTID BILAT MOD SED  01/07/2018  . IR ANGIO VERTEBRAL SEL VERTEBRAL UNI R MOD SED  04/07/2017  . IR ANGIO VERTEBRAL SEL VERTEBRAL UNI R MOD SED  01/07/2018  . IR ANGIOGRAM FOLLOW UP STUDY  04/07/2017  . IR ANGIOGRAM FOLLOW UP STUDY  04/07/2017  . IR ANGIOGRAM FOLLOW UP STUDY  04/07/2017  . IR ANGIOGRAM FOLLOW UP STUDY  04/07/2017  . IR ANGIOGRAM FOLLOW UP STUDY  04/07/2017  . IR ANGIOGRAM SELECTIVE EACH ADDITIONAL VESSEL  04/07/2017  . IR  TRANSCATH/EMBOLIZ  04/07/2017  . IR US GUIDE VASC ACCESS RIGHT  01/07/2018  . ORIF ANKLE FRACTURE Right 07/01/2018   Procedure: RIGHT ANKLE FRACTURE OPEN TREATMENT TRIMALLEOLAR INCLUDES INTERNAL FIXATION WITHOUT FIXATION OF POSTERIOR LIP, FRACTURE CLOSED TREATMENT TRIMALLEOLAR ANKLE WITH MANIPULATION, REPAIR SYNDESMOSIS, REPAIR DELTOID LIGAMENT;  Surgeon: Renette Butters, MD;  Location: [place];  Service: Orthopedics;  Laterality: Right;  . PARS PLANA VITRECTOMY Right 05/01/2017   Procedure: PARS PLANA VITRECTOMY WITH 25 GAUGE RIGHT EYE, endolaser photocoaglation;  Surgeon: Jalene Mullet, MD;  Location: [place];  Service: Ophthalmology;  Laterality: Right;  . RADIOLOGY WITH ANESTHESIA N/A 04/07/2017   Procedure: RADIOLOGY WITH ANESTHESIA;  Surgeon: Consuella Lose, MD;  Location: [place];  Service: Radiology;  Laterality: N/A;  . REPLACEMENT TOTAL KNEE BILATERAL  2004  . TEMPOROMANDIBULAR JOINT SURGERY     2 surgeries  . TUMOR REMOVAL    . VAGOTOMY  01/23/2012   Procedure: VAGOTOMY, antrectomy and BII;  Surgeon: Haywood Lasso, MD;  Location: [facility];  Service: General;  Laterality: N/A;  Laparotomy with vagotomy.    There were no vitals filed for this visit.  Subjective Assessment - 02/23/19 1457    Subjective  Having more leg pain today.  Brought in regular RW and needs tennis balls to decrease friction.  Is doing a lot more  now at home - standing to cook and walking more.    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  Yes    Pain Onset  More than a month ago                        Vestibular Treatment/Exercise - 02/23/19 1459      Vestibular Treatment/Exercise   Vestibular Treatment Provided  Gaze    Habituation Exercises   Standing Diagonal Head Turns;Standing Vertical Head Turns    Gaze Exercises  X1 Viewing Horizontal;X1 Viewing Vertical      Standing Vertical Head Turns   Number of Reps   10    Symptom Description   standing reaching from table <> floor to place 2 blocks on floor or table with min A      Standing Diagonal Head Turns   Number of Reps   4    Symptiom Description   standing and performing diagonals reaching and moving 2 blocks from table <> mat with min A      X1 Viewing Horizontal   Foot Position  standing feet staggered R/L     Reps  2    Comments  60 seconds, intermittent min-mod A from therapist when LLE is back      X1 Viewing Vertical   Foot Position  standing feet staggered R/L     Reps  2    Comments  60 seconds, intermittent min-mod A from therapist when LLE is back            PT Education - 02/23/19 1536    Education provided  Yes    Education Details  plan to review 4 more weeks at end of March for consistent land therapy while performing aquatic therapy    Person(s) Educated  Patient    Methods  Explanation    Comprehension  Verbalized understanding       PT Short Term Goals - 02/06/19 1017      PT SHORT TERM GOAL #1   Title  Pt will report independence and compliance with land HEP;  continue participation in aquatic therapy with aquatic HEP to be established    Baseline  met per pt report - pt continues to participate in aquatic therapy with OT - 02-05-19    Status  Achieved      PT SHORT TERM GOAL #2   Title  Pt will improve gait speed to >/= 2.3 ft/esc indicative of community ambulation with rollator    Baseline  2.17 ft/sec with rollator on 01-05-19:  2 trials of gait speed tested - 14.00 secs = 2.34 ft/sec & 12.78 secs = 2.57 ft/sec withh rollator - 02-05-19    Status  Achieved      PT SHORT TERM GOAL #3   Title  Pt will improve Berg score to >/= 45/56 indicating decreased fall risk potential     Baseline  43/56 on 01-05-19; Berg score 38/56 (5 point  decrease from 01-05-19) on 02-05-19    Status  Not Met      PT SHORT TERM GOAL #4   Title  Pt will improve TUG score to >/= 17 seconds indicating decreased risk for falls and improvement in functional mobility  using rollator    Baseline  19.91 secs on 01-05-19; 2 trials of TUG completed:  17.34 secs, 17.22 secs - improvement with score but not fully met stated goal - 02-05-19; instability noted with turn with fall risk noted during TUG today    Status  Partially Met      PT SHORT TERM GOAL #5   Title  Pt will report </= 5/10 pain in R hip during exercises, WB and gait    Baseline  12/08/18: 8/10 before and after session; 6/10 pain intensity reported on 02-05-19 in Lt groin with pt scheduled for nerve block on 02-11-19    Status  Not Met        PT Long Term Goals - 01/08/19 1527      PT LONG TERM GOAL #1   Title  Pt will demonstrate independence with land and aquatic HEP    Baseline  Land HEP is established; aquatic HEP is on-going as pt is currently participating in aquatic therapy with OT    Time  8    Period  Weeks    Status  On-going    Target Date  03/10/19      PT LONG TERM GOAL #2   Title  Pt will improve Berg score to >/= 47/56 to decrease fall risk potential - CONTINUE this LTG    Baseline  43/56 on 01-05-19    Time  8    Period  Weeks    Status  Partially Met    Target Date  03/10/19      PT LONG TERM GOAL #3   Title  Pt will improve gait speed to >/=2.5 ft/sec indicating significant change in functional mobility with LRAD: REVISED 01-08-19:  Improve gait velocity to >/= 2.4 ft/sec with use of rollator for incr. gati efficiency.    Baseline  1.9 with RW, 2.17 with rollator    Time  8    Period  Weeks    Status  Not Met   REVISED   Target Date  03/10/19      PT LONG TERM GOAL #4   Title  Pt will improve TUG score to </= 13.5 seconds using LRAD indicating decreased fall risk potential ; REVISED to </= 15 secs with rollator     Baseline  23.37 with rolling walker, 19.91 with  rollator    Time  8    Status  Not Met   REVISED   Target Date  03/10/19      PT LONG TERM GOAL #5   Title  Pt will negotiate 12 steps using B HR's and alternating stepping technique with supervision to improve community accessibility     Baseline  met 11-17-18    Status  Achieved      PT LONG TERM GOAL #6   Title  Pt will report ability to perform household ambulation using her RW with modified independence demonstrating significant improvement in functional mobility     Baseline  met 01-08-19; pt now has obtained rollator for assistance with ambulation    Status  Achieved      PT LONG TERM GOAL #7   Title  Pt will ambulate outdoors over paved surfaces and perform curbs and inclines with supervision using LRAD (ROLLATOR) for 300 feet to improve functional mobility with community distances     Time  8    Period  Weeks    Status  On-going    Target Date  03/10/19  PT LONG TERM GOAL #8   Title  Pt will ambulate over level surfaces with her RW for 230 feet with supervision using LRAD demonstrating improvement in functional mobility     Status  Achieved            Plan - 02/23/19 1709    Clinical Impression Statement  Continued to focus on vestibular impairments with review of x1 viewing with staggered stance increasing to 60 seconds and decreasing UE support.  Also continued to focus on balance and postural control during vertical and horizontal head turns combined with bimanual reaching from low <> mid height in front and in diagonal directions.  Only light min guard required.  Will continue to address and progress towards LTG.    Rehab Potential  Good    Clinical Impairments Affecting Rehab Potential  high risk for falls due to abnormal gait, ataxia, sensory changes, and impaired cognition.     PT Frequency  2x / week    PT Duration  8 weeks    PT Treatment/Interventions  ADLs/Self Care Home Management;Aquatic Therapy;DME Instruction;Gait training;Stair training;Functional  mobility training;Therapeutic activities;Therapeutic exercise;Balance training;Neuromuscular re-education;Patient/family education;Cognitive remediation;Orthotic Fit/Training;Electrical Stimulation;Cryotherapy;Moist Heat;Manual techniques;Passive range of motion;Taping;Energy conservation;Dry needling;Vestibular    PT Next Visit Plan  Continue to revise HEP.  Continue to work on turning/pivoting with one UE support, partial SLS.   Balance with narrow BOS.  Continue to progress x 1 viewing - staggered stance.  reaching in diagonals with head turns in standing.  proximal stability and core activation in sidelying/supine (pilates?), tall kneeling (trunk stability during UE movement/resistance with theraband) and standing, focus on decreased use of lumbar extensors during gait, cont to work on balance, gait and trunk stabilization activities     PT Home Exercise Plan  Access Code: [email or web address]     Consulted and Agree with Plan of Care  Patient;Family member/caregiver    Family Member Consulted  mother       Patient will benefit from skilled therapeutic intervention in order to improve the following deficits and impairments:  Abnormal gait, Decreased balance, Decreased cognition, Decreased coordination, Decreased strength, Difficulty walking, Impaired sensation, Pain, Decreased activity tolerance, Decreased mobility, Decreased endurance, Postural dysfunction, Decreased knowledge of use of DME, Impaired perceived functional ability, Decreased range of motion, Impaired flexibility, Dizziness  Visit Diagnosis: Ataxic gait  Other lack of coordination  Other abnormalities of gait and mobility  Unsteadiness on feet  Dizziness and giddiness     Problem List Patient Active Problem List   Diagnosis Date Noted  . Trimalleolar fracture of ankle, closed, right, initial encounter 11/27/2018  . Tobacco use disorder 11/27/2018  . Osteoarthritis of right hip 10/07/2018  . 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  Weakness with dizziness-  since [facility] and CVA 04/07/17 08/07/2017  . Disturbances of vision, late effect of stroke 08/06/2017  . Health education/counseling 08/05/2017  . High risk medications (not anticoagulants) long-term use 08/05/2017  . Neuritis-  R sided:  arm and leg/ body due to stroke 08/05/2017  . Elevated LDL cholesterol level 07/11/2017  . [employer] (NIH) Stroke Scale limb ataxia score 2, ataxia present in two limbs 06/25/2017  . 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 Elevated vitamin B12 level 05/30/2017  . Vitamin D deficiency 05/29/2017  . History of tobacco abuse-  30pk yr hx - quit 04/07/17 05/21/2017  . Gait disturbance, post-stroke 05/14/2017  . Benign essential HTN   . Vitreous hemorrhage of right eye ([place])   .  Adjustment disorder with mixed anxiety and depressed mood   . 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 s/p SAH (subarachnoid hemorrhage) ([place]) 04/19/2017  . 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 Hypoxia   . Subarachnoid hemorrhage due to ruptured aneurysm ([place]) 04/07/2017  . CVA (cerebral vascular accident) ([place]) 04/07/2017  . Elevated gastrin level 01/25/2012  . Hypokalemia 01/21/2012  . Nausea & vomiting 01/20/2012  . Epigastric pain 01/20/2012  . Duodenal ulcer, acute with obstruction 10/17/2011  . S/P laparoscopic cholecystectomy 10/16/2011    Rico Junker, PT, DPT 02/23/19    5:13 PM    [facility] [address] [place], [place], [address] Phone: [phone]   Fax:  [phone]  Name: CHLOEANNE POTEET MRN: [id number] Date of Birth: 1976/01/25

## 2019-02-25 ENCOUNTER — Encounter: Payer: Self-pay | Admitting: Occupational Therapy

## 2019-02-25 ENCOUNTER — Encounter: Payer: BLUE CROSS/BLUE SHIELD | Admitting: Occupational Therapy

## 2019-02-25 DIAGNOSIS — R2681 Unsteadiness on feet: Secondary | ICD-10-CM

## 2019-02-25 NOTE — Therapy (Signed)
[facility] [address] [place], [place], [address] Phone: [phone]   Fax:  [phone]  Occupational Therapy Treatment  Patient Details  Name: [name] Barnett MRN: [id number] Date of Birth: 05/05/1976 Referring Provider (OT): Dr. Andrew Kirsteins   Encounter Date: 02/25/2019    Past Medical History:  Diagnosis Date  . Duodenal obstruction   . GERD (gastroesophageal reflux disease)   . Hypertension   . Stroke ([name])    april 2018, lt sided weakness  . Trimalleolar fracture of ankle, closed, right, initial encounter     Past Surgical History:  Procedure Laterality Date  . BALLOON DILATION  12/31/2011   Procedure: BALLOON DILATION;  Surgeon: [name], MD;  [facility];  Service: Endoscopy;  Laterality: N/A;  . CHOLECYSTECTOMY  07/28/11  . ESOPHAGOGASTRODUODENOSCOPY  10/17/2011   Procedure: ESOPHAGOGASTRODUODENOSCOPY (EGD);  Surgeon: [name], MD;  [facility];  Service: Endoscopy;  Laterality: N/A;  . IR ANGIO INTRA EXTRACRAN SEL INTERNAL CAROTID BILAT MOD SED  04/07/2017  . IR ANGIO INTRA EXTRACRAN SEL INTERNAL CAROTID BILAT MOD SED  01/07/2018  . IR ANGIO VERTEBRAL SEL VERTEBRAL UNI R MOD SED  04/07/2017  . IR ANGIO VERTEBRAL SEL VERTEBRAL UNI R MOD SED  01/07/2018  . IR ANGIOGRAM FOLLOW UP STUDY  04/07/2017  . IR ANGIOGRAM FOLLOW UP STUDY  04/07/2017  . IR ANGIOGRAM FOLLOW UP STUDY  04/07/2017  . IR ANGIOGRAM FOLLOW UP STUDY  04/07/2017  . IR ANGIOGRAM FOLLOW UP STUDY  04/07/2017  . IR ANGIOGRAM SELECTIVE EACH ADDITIONAL VESSEL  04/07/2017  . IR TRANSCATH/EMBOLIZ  04/07/2017  . IR US GUIDE VASC ACCESS RIGHT  01/07/2018  . ORIF ANKLE FRACTURE Right 07/01/2018   Procedure: RIGHT ANKLE FRACTURE OPEN TREATMENT TRIMALLEOLAR INCLUDES INTERNAL FIXATION WITHOUT FIXATION OF POSTERIOR LIP, FRACTURE CLOSED TREATMENT TRIMALLEOLAR ANKLE WITH MANIPULATION, REPAIR SYNDESMOSIS, REPAIR DELTOID LIGAMENT;   Surgeon: Murphy, Timothy D, MD;  Location: [facility];  Service: Orthopedics;  Laterality: Right;  . PARS PLANA VITRECTOMY Right 05/01/2017   Procedure: PARS PLANA VITRECTOMY WITH 25 GAUGE RIGHT EYE, endolaser photocoaglation;  Surgeon: Patel, Narendra, MD;  [facility];  Service: Ophthalmology;  Laterality: Right;  . RADIOLOGY WITH ANESTHESIA N/A 04/07/2017   Procedure: RADIOLOGY WITH ANESTHESIA;  Surgeon: Neelesh Nundkumar, MD;  [facility];  Service: Radiology;  Laterality: N/A;  . REPLACEMENT TOTAL KNEE BILATERAL  2004  . TEMPOROMANDIBULAR JOINT SURGERY     2 surgeries  . TUMOR REMOVAL    . VAGOTOMY  01/23/2012   Procedure: VAGOTOMY, antrectomy and BII;  Surgeon: Christian J Streck, MD;  [facility] OR;  Service: General;  Laterality: N/A;  Laparotomy with vagotomy.    There were no vitals filed for this visit.                          OT Short Term Goals - 02/18/19 1937      OT SHORT TERM GOAL #1   Title  Pt will verbalize understanding of basic water principles in prep for aquatic HEP - 02/04/2019    Status  On-going      OT SHORT TERM GOAL #7   Status  --   inconsistent.  01/09/18 met at approx this level       OT Long Term Goals - 02/18/19 1937      OT LONG TERM GOAL #1   Title  Pt and family willl   be mod I with aquatic HEP (for use when pt has access to pool) -03/04/2019 (renewed)    Status  On-going      OT LONG TERM GOAL #2   Title  Pt will demonstrate improved postural alignment and control in standing as demonstrated by ability to do light home mgmt tasks in standing (such as fold laundry)    Status  On-going      OT LONG TERM GOAL #3   Title  Pt will report greater ease in using LUE for simple home mgmt tasks (such as unloading dishwasher into [employer]) .     Status  On-going      OT LONG TERM GOAL #4   Title  Pt will be mod I using RW during simple home mgmt tasks at static standing level (i.e standing at sink  to wash dishes).      Status  On-going            Plan - 02/25/19 1035    Clinical Impression Statement  Pt currently being seen for aquatic therapy only for OT - due to corona virus the aquatic center has closed until further notice. Will place pt on hold with plans to resume once aquatic center opens.  Pt will continue with PT in the clinic    Occupational Profile and client history currently impacting functional performance  Pt is a 43 y.o. female s/p subarachnoid hemorrhage due to ruptured aneurysm, L cerebellar infarct with ataxia.  Pt was working full time and indpendent prior to hospitalization.  Pt lives with husband and cared for 44 y.o. daughter.  Medical history includes HTN, GERD, and Bilateral retinal hemorhage associated with SAH (needed surgery).      Occupational performance deficits (Please refer to evaluation for details):  ADL's;IADL's;Leisure;Social Participation;Work    Rehab Potential  Good    OT Frequency  1x / week    OT Duration  8 weeks    OT Treatment/Interventions  Self-care/ADL training;Therapeutic exercise;Therapeutic activities;Cognitive remediation/compensation;Passive range of motion;Functional Mobility Training;Neuromuscular education;Energy conservation;Manual Therapy;Patient/family education;Balance training;Aquatic Therapy    Consulted and Agree with Plan of Care  Patient       Patient will benefit from skilled therapeutic intervention in order to improve the following deficits and impairments:     Visit Diagnosis: Unsteadiness on feet    Problem List Patient Active Problem List   Diagnosis Date Noted  . Trimalleolar fracture of ankle, closed, right, initial encounter 11/27/2018  . Tobacco use disorder 11/27/2018  . Osteoarthritis of right hip 10/07/2018  . 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 Weakness with dizziness-  since [facility] and CVA 04/07/17 08/07/2017  . Disturbances of vision, late effect of stroke 08/06/2017  . Health education/counseling  08/05/2017  . High risk medications (not anticoagulants) long-term use 08/05/2017  . Neuritis-  R sided:  arm and leg/ body due to stroke 08/05/2017  . Elevated LDL cholesterol level 07/11/2017  . [employer] (NIH) Stroke Scale limb ataxia score 2, ataxia present in two limbs 06/25/2017  . 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 Elevated vitamin B12 level 05/30/2017  . Vitamin D deficiency 05/29/2017  . History of tobacco abuse-  30pk yr hx - quit 04/07/17 05/21/2017  . Gait disturbance, post-stroke 05/14/2017  . Benign essential HTN   . Vitreous hemorrhage of right eye ([place])   . Adjustment disorder with mixed anxiety and depressed mood   . 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 s/p SAH (subarachnoid hemorrhage) ([name]) 04/19/2017  . Basilar artery aneurysm ([name])   . Hypoxia   . 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 CVA (cerebral vascular accident) ([name]) 04/07/2017  . Elevated gastrin level 01/25/2012  . Hypokalemia 01/21/2012  . Nausea & vomiting 01/20/2012  . Epigastric pain 01/20/2012  . Duodenal ulcer, acute with obstruction 10/17/2011  . S/P laparoscopic cholecystectomy 10/16/2011    [place], Karen Halliday, OTR/L 02/25/2019, 10:37 AM   [facility] [address] [place], [place], [address] Phone: [phone]   Fax:  [phone]  Name: [name] Barnett MRN: [id number] Date of Birth: 10/05/1976 

## 2019-02-26 ENCOUNTER — Ambulatory Visit: Payer: BLUE CROSS/BLUE SHIELD | Admitting: Physical Therapy

## 2019-02-26 ENCOUNTER — Encounter: Payer: Self-pay | Admitting: Physical Therapy

## 2019-02-26 DIAGNOSIS — R260 Ataxic gait: Secondary | ICD-10-CM | POA: Diagnosis not present

## 2019-02-26 DIAGNOSIS — R2681 Unsteadiness on feet: Secondary | ICD-10-CM

## 2019-02-26 DIAGNOSIS — R293 Abnormal posture: Secondary | ICD-10-CM

## 2019-02-26 DIAGNOSIS — M6281 Muscle weakness (generalized): Secondary | ICD-10-CM

## 2019-02-26 NOTE — Therapy (Signed)
[facility] [address] [place], [place], [address] Phone: [phone]   Fax:  [phone]  Physical Therapy Treatment  Patient Details  Name: [name] Barnett MRN: [id number] Date of Birth: 08-31-1976 Referring Provider (PT): Edmonia Lynch, MD   Encounter Date: 02/26/2019  PT End of Session - 02/26/19 1537    Visit Number  36    Number of Visits  43    Date for PT Re-Evaluation  03/10/19    Authorization Type  BCBS    PT Start Time  2330    PT Stop Time  1615    PT Time Calculation (min)  44 min    Equipment Utilized During Treatment  Gait belt    Activity Tolerance  Patient tolerated treatment well    Behavior During Therapy  WFL for tasks assessed/performed       Past Medical History:  Diagnosis Date  . Duodenal obstruction   . GERD (gastroesophageal reflux disease)   . Hypertension   . Stroke [facility])    april 2018, lt sided weakness  . Trimalleolar fracture of ankle, closed, right, initial encounter     Past Surgical History:  Procedure Laterality Date  . BALLOON DILATION  12/31/2011   Procedure: BALLOON DILATION;  Surgeon: Missy Sabins, MD;  Location: [facility] ENDOSCOPY;  Service: Endoscopy;  Laterality: N/A;  . CHOLECYSTECTOMY  07/28/11  . ESOPHAGOGASTRODUODENOSCOPY  10/17/2011   Procedure: ESOPHAGOGASTRODUODENOSCOPY (EGD);  Surgeon: Missy Sabins, MD;  Location: [facility] ENDOSCOPY;  Service: Endoscopy;  Laterality: N/A;  . IR ANGIO INTRA EXTRACRAN SEL INTERNAL CAROTID BILAT MOD SED  04/07/2017  . IR ANGIO INTRA EXTRACRAN SEL INTERNAL CAROTID BILAT MOD SED  01/07/2018  . IR ANGIO VERTEBRAL SEL VERTEBRAL UNI R MOD SED  04/07/2017  . IR ANGIO VERTEBRAL SEL VERTEBRAL UNI R MOD SED  01/07/2018  . IR ANGIOGRAM FOLLOW UP STUDY  04/07/2017  . IR ANGIOGRAM FOLLOW UP STUDY  04/07/2017  . IR ANGIOGRAM FOLLOW UP STUDY  04/07/2017  . IR ANGIOGRAM FOLLOW UP STUDY  04/07/2017  . IR ANGIOGRAM FOLLOW UP STUDY  04/07/2017  . IR ANGIOGRAM  SELECTIVE EACH ADDITIONAL VESSEL  04/07/2017  . IR TRANSCATH/EMBOLIZ  04/07/2017  . IR US GUIDE VASC ACCESS RIGHT  01/07/2018  . ORIF ANKLE FRACTURE Right 07/01/2018   Procedure: RIGHT ANKLE FRACTURE OPEN TREATMENT TRIMALLEOLAR INCLUDES INTERNAL FIXATION WITHOUT FIXATION OF POSTERIOR LIP, FRACTURE CLOSED TREATMENT TRIMALLEOLAR ANKLE WITH MANIPULATION, REPAIR SYNDESMOSIS, REPAIR DELTOID LIGAMENT;  Surgeon: Renette Butters, MD;  Location: [facility];  Service: Orthopedics;  Laterality: Right;  . PARS PLANA VITRECTOMY Right 05/01/2017   Procedure: PARS PLANA VITRECTOMY WITH 25 GAUGE RIGHT EYE, endolaser photocoaglation;  Surgeon: Jalene Mullet, MD;  Location: [facility];  Service: Ophthalmology;  Laterality: Right;  . RADIOLOGY WITH ANESTHESIA N/A 04/07/2017   Procedure: RADIOLOGY WITH ANESTHESIA;  Surgeon: Consuella Lose, MD;  Location: [facility];  Service: Radiology;  Laterality: N/A;  . REPLACEMENT TOTAL KNEE BILATERAL  2004  . TEMPOROMANDIBULAR JOINT SURGERY     2 surgeries  . TUMOR REMOVAL    . VAGOTOMY  01/23/2012   Procedure: VAGOTOMY, antrectomy and BII;  Surgeon: Haywood Lasso, MD;  Location: [facility];  Service: General;  Laterality: N/A;  Laparotomy with vagotomy.    There were no vitals filed for this visit.  Subjective Assessment - 02/26/19 1532    Subjective  No new complaints. No falls to report.     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  Yes    Pain Score  8     Pain Location  Groin    Pain Orientation  Right    Pain Descriptors / Indicators  Sore    Pain Type  Neuropathic pain    Pain Radiating Towards  down leg at times    Pain Onset  More than a month ago    Pain Frequency  Constant     Aggravating Factors   sitting too long    Pain Relieving Factors  movements helps, stretching, meds, being in the water             [facility] Adult PT Treatment/Exercise - 02/26/19 1551      Neuro Re-ed    Neuro Re-ed Details   in tall kneeling on mat table: PTA provided pertubations in various directions for sustained holds; with weighted 2# dowel rod- UE raises for 10 reps, then upper trunk rotation left<>right x10 each way, min guard assist with cues on form and posture; with green theraband: rows for 10 reps with 5 sec holds, then shoulder horizontal abduction for 10 reps, cues on form and posture with min guard assist; standing without UE support; zoom ball with feet hip width apart for 2-3 minutes with cues on posture, core activation and ex technique. Min guard to min assist for balance.      Pilates   Pilates Mat  on blue mat: roll ups for 5 reps x 2 sets, then single leg stretch for 10 reps, then additional roll ups with legs over bolster for 4 reps, 2 sets. cues on form and breath technique with each movement.                 PT Short Term Goals - 02/06/19 1017      PT SHORT TERM GOAL #1   Title  Pt will report independence and compliance with land HEP;  continue participation in aquatic therapy with aquatic HEP to be established    Baseline  met per pt report - pt continues to participate in aquatic therapy with OT - 02-05-19    Status  Achieved      PT SHORT TERM GOAL #2   Title  Pt will improve gait speed to >/= 2.3 ft/esc indicative of community ambulation with rollator    Baseline  2.17 ft/sec with rollator on 01-05-19:  2 trials of gait speed tested - 14.00 secs = 2.34 ft/sec & 12.78 secs = 2.57 ft/sec withh rollator - 02-05-19    Status  Achieved      PT SHORT TERM GOAL #3   Title  Pt will improve Berg score to >/= 45/56 indicating decreased fall risk potential     Baseline  43/56 on 01-05-19; Berg score 38/56 (5 point decrease from 01-05-19) on 02-05-19    Status   Not Met      PT SHORT TERM GOAL #4   Title  Pt will improve TUG score to >/= 17 seconds indicating decreased risk for falls and improvement in functional mobility using rollator    Baseline  19.91 secs on 01-05-19; 2 trials of TUG completed:  17.34 secs, 17.22 secs - improvement  with score but not fully met stated goal - 02-05-19; instability noted with turn with fall risk noted during TUG today    Status  Partially Met      PT SHORT TERM GOAL #5   Title  Pt will report </= 5/10 pain in R hip during exercises, WB and gait    Baseline  12/08/18: 8/10 before and after session; 6/10 pain intensity reported on 02-05-19 in Lt groin with pt scheduled for nerve block on 02-11-19    Status  Not Met        PT Long Term Goals - 01/08/19 1527      PT LONG TERM GOAL #1   Title  Pt will demonstrate independence with land and aquatic HEP    Baseline  Land HEP is established; aquatic HEP is on-going as pt is currently participating in aquatic therapy with OT    Time  8    Period  Weeks    Status  On-going    Target Date  03/10/19      PT LONG TERM GOAL #2   Title  Pt will improve Berg score to >/= 47/56 to decrease fall risk potential - CONTINUE this LTG    Baseline  43/56 on 01-05-19    Time  8    Period  Weeks    Status  Partially Met    Target Date  03/10/19      PT LONG TERM GOAL #3   Title  Pt will improve gait speed to >/=2.5 ft/sec indicating significant change in functional mobility with LRAD: REVISED 01-08-19:  Improve gait velocity to >/= 2.4 ft/sec with use of rollator for incr. gati efficiency.    Baseline  1.9 with RW, 2.17 with rollator    Time  8    Period  Weeks    Status  Not Met   REVISED   Target Date  03/10/19      PT LONG TERM GOAL #4   Title  Pt will improve TUG score to </= 13.5 seconds using LRAD indicating decreased fall risk potential ; REVISED to </= 15 secs with rollator     Baseline  23.37 with rolling walker, 19.91 with rollator    Time  8    Status  Not Met    REVISED   Target Date  03/10/19      PT LONG TERM GOAL #5   Title  Pt will negotiate 12 steps using B HR's and alternating stepping technique with supervision to improve community accessibility     Baseline  met 11-17-18    Status  Achieved      PT LONG TERM GOAL #6   Title  Pt will report ability to perform household ambulation using her RW with modified independence demonstrating significant improvement in functional mobility     Baseline  met 01-08-19; pt now has obtained rollator for assistance with ambulation    Status  Achieved      PT LONG TERM GOAL #7   Title  Pt will ambulate outdoors over paved surfaces and perform curbs and inclines with supervision using LRAD (ROLLATOR) for 300 feet to improve functional mobility with community distances     Time  8    Period  Weeks    Status  On-going    Target Date  03/10/19      PT LONG TERM GOAL #8   Title  Pt will ambulate over level surfaces with her RW for 230 feet with supervision  using LRAD demonstrating improvement in functional mobility     Status  Achieved            Plan - 02/26/19 1602    Clinical Impression Statement  Today's skilled session focused on core strengthening, coordination and balance in standing with no issues reported. The pt is progressing toward goals and should benefit from continued PT to progress toward unmet goals.     Rehab Potential  Good    Clinical Impairments Affecting Rehab Potential  high risk for falls due to abnormal gait, ataxia, sensory changes, and impaired cognition.     PT Frequency  2x / week    PT Duration  8 weeks    PT Treatment/Interventions  ADLs/Self Care Home Management;Aquatic Therapy;DME Instruction;Gait training;Stair training;Functional mobility training;Therapeutic activities;Therapeutic exercise;Balance training;Neuromuscular re-education;Patient/family education;Cognitive remediation;Orthotic Fit/Training;Electrical Stimulation;Cryotherapy;Moist Heat;Manual  techniques;Passive range of motion;Taping;Energy conservation;Dry needling;Vestibular    PT Next Visit Plan  Continue to revise HEP.  Continue to work on turning/pivoting with one UE support, partial SLS.   Balance with narrow BOS.  Continue to progress x 1 viewing - staggered stance.  reaching in diagonals with head turns in standing.  proximal stability and core activation in sidelying/supine (pilates?), tall kneeling (trunk stability during UE movement/resistance with theraband) and standing, focus on decreased use of lumbar extensors during gait, cont to work on balance, gait and trunk stabilization activities     PT Home Exercise Plan  Access Code: [email or web address]     Consulted and Agree with Plan of Care  Patient;Family member/caregiver    Family Member Consulted  mother       Patient will benefit from skilled therapeutic intervention in order to improve the following deficits and impairments:  Abnormal gait, Decreased balance, Decreased cognition, Decreased coordination, Decreased strength, Difficulty walking, Impaired sensation, Pain, Decreased activity tolerance, Decreased mobility, Decreased endurance, Postural dysfunction, Decreased knowledge of use of DME, Impaired perceived functional ability, Decreased range of motion, Impaired flexibility, Dizziness  Visit Diagnosis: Unsteadiness on feet  Muscle weakness (generalized)  Abnormal posture     Problem List Patient Active Problem List   Diagnosis Date Noted  . Trimalleolar fracture of ankle, closed, right, initial encounter 11/27/2018  . Tobacco use disorder 11/27/2018  . Osteoarthritis of right hip 10/07/2018  . 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 Weakness with dizziness-  since [facility] and CVA 04/07/17 08/07/2017  . Disturbances of vision, late effect of stroke 08/06/2017  . Health education/counseling 08/05/2017  . High risk medications (not anticoagulants) long-term use 08/05/2017  . Neuritis-  R sided:  arm and leg/ body due to stroke  08/05/2017  . Elevated LDL cholesterol level 07/11/2017  . [employer] (NIH) Stroke Scale limb ataxia score 2, ataxia present in two limbs 06/25/2017  . 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 Elevated vitamin B12 level 05/30/2017  . Vitamin D deficiency 05/29/2017  . History of tobacco abuse-  30pk yr hx - quit 04/07/17 05/21/2017  . Gait disturbance, post-stroke 05/14/2017  . Benign essential HTN   . Vitreous hemorrhage of right eye ([place])   . Adjustment disorder with mixed anxiety and depressed mood   . 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 s/p SAH (subarachnoid hemorrhage) ([place]) 04/19/2017  . 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 Hypoxia   . Subarachnoid hemorrhage due to ruptured aneurysm ([place]) 04/07/2017  . CVA (cerebral vascular accident) ([place]) 04/07/2017  .  Elevated gastrin level 01/25/2012  . Hypokalemia 01/21/2012  . Nausea & vomiting 01/20/2012  . Epigastric pain 01/20/2012  . Duodenal ulcer, acute with obstruction 10/17/2011  . S/P laparoscopic cholecystectomy 10/16/2011    Willow Ora, PTA, [facility] [address], [place], [place] [address] [phone] 02/26/19, 10:37 PM   Name: LYNZIE CLIBURN MRN: [id number] Date of Birth: 05-05-1976

## 2019-03-02 ENCOUNTER — Telehealth: Payer: Self-pay | Admitting: Physical Therapy

## 2019-03-02 ENCOUNTER — Ambulatory Visit: Payer: BLUE CROSS/BLUE SHIELD | Admitting: Physical Therapy

## 2019-03-02 NOTE — Telephone Encounter (Signed)
[name] Barnett was contacted today regarding the temporary closing of OP Rehab Services due to Covid-19.  Therapist discussed with pt her current PT home ex program with her reporting no issues. Pt was already aware of the OT plan to hold for now until the aquatic program can resume at the [facility]. She stated she has no questions at this time and appreciated the call. Pt was advised our front office would contact her if any further changes to the schedule are to occur. Pt verbalized understanding.    OP Rehabilitation Services will follow up with patients when we are able to resume care.  [name], PTA, [facility] Outpatient Neuro [facility] [address], [address] [place], [place] [address] [phone] 03/02/19, 12:10 PM   Ne[facility]rehabilitation Center [address] [place], [place]  [address] Phone:  [phone] Fax:  [phone]

## 2019-03-04 ENCOUNTER — Encounter: Payer: BLUE CROSS/BLUE SHIELD | Admitting: Occupational Therapy

## 2019-03-05 ENCOUNTER — Ambulatory Visit: Payer: BLUE CROSS/BLUE SHIELD | Admitting: Physical Therapy

## 2019-03-09 ENCOUNTER — Ambulatory Visit: Payer: BLUE CROSS/BLUE SHIELD | Admitting: Physical Therapy

## 2019-03-09 ENCOUNTER — Ambulatory Visit: Payer: Self-pay | Admitting: Physical Therapy

## 2019-03-09 ENCOUNTER — Other Ambulatory Visit: Payer: Self-pay | Admitting: Physical Medicine & Rehabilitation

## 2019-03-10 NOTE — Telephone Encounter (Signed)
Last clinic note indicates patient no longer needs to follow up with you

## 2019-03-12 ENCOUNTER — Ambulatory Visit: Payer: Self-pay | Admitting: Physical Therapy

## 2019-03-12 ENCOUNTER — Ambulatory Visit: Payer: BLUE CROSS/BLUE SHIELD | Admitting: Physical Therapy

## 2019-03-13 ENCOUNTER — Ambulatory Visit: Payer: Self-pay | Admitting: Physical Therapy

## 2019-03-13 ENCOUNTER — Telehealth: Payer: Self-pay | Admitting: Physical Therapy

## 2019-03-13 NOTE — Telephone Encounter (Signed)
[name] Barnett was contacted today regarding temporary reduction of Outpatient Neuro Rehabilitation Services due to concerns for community transmission of COVID-19.  Patient identity was verified.  Assessed if patient needed to be seen in person by clinician (recent fall or acute injury that requires hands on assessment and advice, change in diet order, post-surgical, special cases, etc.).    Patient did not have an acute/special need that requires in person visit. Proceeded with phone call.  Therapist advised the patient to continue to perform her HEP and assured she had no unanswered questions or concerns at this time.  The patient was offered and declined the continuation of their plan of care by using methods such as an E-Visit, virtual check in, or Telehealth visit.  Outpatient [facility] will follow up with this client when we are able to safely resume care at the [facility] [address] clinic in person.  Patient is aware we can be reached by telephone during limited business hours in the meantime.  [name] Barnett, PT, DPT 03/13/19    4:03 PM

## 2019-03-16 ENCOUNTER — Ambulatory Visit: Payer: BLUE CROSS/BLUE SHIELD | Admitting: Physical Therapy

## 2019-03-16 NOTE — Telephone Encounter (Signed)
Please tell pt to obtain this from PCP since I no longer need to follow pt.  If she wants me to continue prescribing , I'll need to see pt on yearly basis

## 2019-03-23 ENCOUNTER — Other Ambulatory Visit: Payer: Self-pay

## 2019-03-23 ENCOUNTER — Other Ambulatory Visit (INDEPENDENT_AMBULATORY_CARE_PROVIDER_SITE_OTHER): Payer: BLUE CROSS/BLUE SHIELD

## 2019-03-23 ENCOUNTER — Ambulatory Visit: Payer: BLUE CROSS/BLUE SHIELD | Admitting: Physical Therapy

## 2019-03-23 DIAGNOSIS — I639 Cerebral infarction, unspecified: Secondary | ICD-10-CM

## 2019-03-23 DIAGNOSIS — E559 Vitamin D deficiency, unspecified: Secondary | ICD-10-CM

## 2019-03-23 DIAGNOSIS — E7800 Pure hypercholesterolemia, unspecified: Secondary | ICD-10-CM

## 2019-03-23 DIAGNOSIS — M792 Neuralgia and neuritis, unspecified: Secondary | ICD-10-CM

## 2019-03-23 DIAGNOSIS — E785 Hyperlipidemia, unspecified: Secondary | ICD-10-CM

## 2019-03-23 DIAGNOSIS — I10 Essential (primary) hypertension: Secondary | ICD-10-CM

## 2019-03-25 ENCOUNTER — Ambulatory Visit (INDEPENDENT_AMBULATORY_CARE_PROVIDER_SITE_OTHER): Payer: BLUE CROSS/BLUE SHIELD | Admitting: Family Medicine

## 2019-03-25 ENCOUNTER — Other Ambulatory Visit: Payer: Self-pay

## 2019-03-25 ENCOUNTER — Encounter: Payer: Self-pay | Admitting: Family Medicine

## 2019-03-25 VITALS — Ht 64.0 in | Wt 141.9 lb

## 2019-03-25 DIAGNOSIS — E559 Vitamin D deficiency, unspecified: Secondary | ICD-10-CM

## 2019-03-25 DIAGNOSIS — R7989 Other specified abnormal findings of blood chemistry: Secondary | ICD-10-CM

## 2019-03-25 DIAGNOSIS — F411 Generalized anxiety disorder: Secondary | ICD-10-CM | POA: Diagnosis not present

## 2019-03-25 DIAGNOSIS — I10 Essential (primary) hypertension: Secondary | ICD-10-CM | POA: Diagnosis not present

## 2019-03-25 DIAGNOSIS — F4323 Adjustment disorder with mixed anxiety and depressed mood: Secondary | ICD-10-CM

## 2019-03-25 DIAGNOSIS — E7800 Pure hypercholesterolemia, unspecified: Secondary | ICD-10-CM | POA: Diagnosis not present

## 2019-03-25 LAB — TSH: TSH: 0.385 u[IU]/mL — ABNORMAL LOW (ref 0.450–4.500)

## 2019-03-25 LAB — COMPREHENSIVE METABOLIC PANEL
ALT: 11 IU/L (ref 0–32)
AST: 9 IU/L (ref 0–40)
Albumin/Globulin Ratio: 1.4 (ref 1.2–2.2)
Albumin: 3.4 g/dL — ABNORMAL LOW (ref 3.8–4.8)
Alkaline Phosphatase: 145 IU/L — ABNORMAL HIGH (ref 39–117)
BUN/Creatinine Ratio: 15 (ref 9–23)
BUN: 10 mg/dL (ref 6–24)
Bilirubin Total: <0.2 mg/dL (ref 0.0–1.2)
CO2: 21 mmol/L (ref 20–29)
Calcium: 9 mg/dL (ref 8.7–10.2)
Chloride: 106 mmol/L (ref 96–106)
Creatinine, Ser: 0.67 mg/dL (ref 0.57–1.00)
GFR calc Af Amer: 125 mL/min/{1.73_m2} (ref >59)
GFR calc non Af Amer: 108 mL/min/{1.73_m2} (ref >59)
Globulin, Total: 2.4 g/dL (ref 1.5–4.5)
Glucose: 109 mg/dL — ABNORMAL HIGH (ref 65–99)
Potassium: 4 mmol/L (ref 3.5–5.2)
Sodium: 144 mmol/L (ref 134–144)
Total Protein: 5.8 g/dL — ABNORMAL LOW (ref 6.0–8.5)

## 2019-03-25 LAB — CBC WITH DIFFERENTIAL/PLATELET
Basophils Absolute: 0.1 10*3/uL (ref 0.0–0.2)
Basos: 1 %
EOS (ABSOLUTE): 0.2 10*3/uL (ref 0.0–0.4)
Eos: 2 %
Hematocrit: 43.7 % (ref 34.0–46.6)
Hemoglobin: 14.4 g/dL (ref 11.1–15.9)
Immature Grans (Abs): 0 10*3/uL (ref 0.0–0.1)
Immature Granulocytes: 0 %
Lymphocytes Absolute: 2.6 10*3/uL (ref 0.7–3.1)
Lymphs: 23 %
MCH: 32.3 pg (ref 26.6–33.0)
MCHC: 33 g/dL (ref 31.5–35.7)
MCV: 98 fL — ABNORMAL HIGH (ref 79–97)
Monocytes Absolute: 0.7 10*3/uL (ref 0.1–0.9)
Monocytes: 6 %
Neutrophils Absolute: 7.4 10*3/uL — ABNORMAL HIGH (ref 1.4–7.0)
Neutrophils: 68 %
Platelets: 457 10*3/uL — ABNORMAL HIGH (ref 150–450)
RBC: 4.46 x10E6/uL (ref 3.77–5.28)
RDW: 12.9 % (ref 11.7–15.4)
WBC: 11 10*3/uL — ABNORMAL HIGH (ref 3.4–10.8)

## 2019-03-25 LAB — LIPID PANEL
Chol/HDL Ratio: 2.5 ratio (ref 0.0–4.4)
Cholesterol, Total: 165 mg/dL (ref 100–199)
HDL: 65 mg/dL (ref >39)
LDL Calculated: 75 mg/dL (ref 0–99)
Triglycerides: 123 mg/dL (ref 0–149)
VLDL Cholesterol Cal: 25 mg/dL (ref 5–40)

## 2019-03-25 LAB — T4, FREE: Free T4: 0.94 ng/dL (ref 0.82–1.77)

## 2019-03-25 LAB — HEMOGLOBIN A1C
Est. average glucose Bld gHb Est-mCnc: 103 mg/dL
Hgb A1c MFr Bld: 5.2 % (ref 4.8–5.6)

## 2019-03-25 LAB — B12 AND FOLATE PANEL
Folate: 15.1 ng/mL (ref >3.0)
Vitamin B-12: 1161 pg/mL (ref 232–1245)

## 2019-03-25 LAB — VITAMIN D 25 HYDROXY (VIT D DEFICIENCY, FRACTURES): Vit D, 25-Hydroxy: 58.2 ng/mL (ref 30.0–100.0)

## 2019-03-25 MED ORDER — BUSPIRONE HCL 5 MG PO TABS: 5.0000 mg | ORAL_TABLET | Freq: Three times a day (TID) | ORAL | 0 refills | Status: DC

## 2019-03-25 NOTE — Progress Notes (Signed)
Virtual Visit via Telephone Note for [employer], D.O- Primary Care Physician at [facility]   I connected with current patient today by telephone and verified that I am speaking with the correct person using two identifiers.   Because of federal recommendations of social distancing due to the current novel COVID-19 outbreak, an audio/video telehealth visit is felt to be most appropriate for this patient at this time.  My staff members also discussed with the patient that there may be a patient charge related to this service.   The patient expressed understanding, and agreed to proceed.    History of Present Illness:   -->  Recent labs done 03/23/19- full set r/w pt   TSH on low side but slightly improved from prior- ASX completely.    BP: not checking at home.   Her Mom has her BP monitor   Mood - she cut back down on prozac dose since at 60 mg caused bad dreams.  She now takes it 20mg  BID.  Anxiety has been bad lately- worries all the time- about family and covid.  Not seeing Mom either- which is difficult for her- feeling alone and isolated and worried about her parents getting covid.  No suicidal or homicidal ideations.   Husband works for [employer], working full time-out of the house.     Wt Readings from Last 3 Encounters:  03/25/19 141 lb 14.4 oz (64.4 kg)  01/06/19 151 lb (68.5 kg)  11/27/18 144 lb (65.3 kg)    BP Readings from Last 3 Encounters:  01/08/19 126/77  01/06/19 135/84  11/27/18 128/82    Pulse Readings from Last 3 Encounters:  01/06/19 77  11/27/18 80  10/07/18 85    BMI Readings from Last 3 Encounters:  03/25/19 24.36 kg/m  01/06/19 25.92 kg/m  11/27/18 24.72 kg/m     -Vitals obtained; Medications, allergies reconciled;  personal medical, social, Sx etc. etc. histories were updated by Leda Min the medical assistant today and are reflected in below chart    Patient Care Team    Relationship Specialty Notifications Start End   Thomasene Lot, DO PCP - General Family Medicine  04/30/17   Charlott Rakes, MD Consulting Physician Gastroenterology  10/17/11   Lisbeth Renshaw, MD Consulting Physician Neurosurgery  05/21/17   Erick Colace, MD Consulting Physician Physical Medicine and Rehabilitation  05/21/17   Olivia Mackie, MD Consulting Physician Obstetrics and Gynecology  05/21/17   Carmela Rima, MD Consulting Physician Ophthalmology  06/13/17    Comment: Neuro ophthalmologist  Sheral Apley, MD Attending Physician Orthopedic Surgery  11/27/18      Patient Active Problem List   Diagnosis Date Noted  . Neuritis-  R sided:  arm and leg/ body due to stroke 08/05/2017    Priority: High  . Elevated LDL cholesterol level 07/11/2017    Priority: High  . History of tobacco abuse-  30pk yr hx - quit 04/07/17 05/21/2017    Priority: High  . Benign essential HTN     Priority: High  . Basilar artery aneurysm ([name])     Priority: High  . 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 CVA (cerebral vascular accident) ([name]) 04/07/2017    Priority: High  . Vitamin D deficiency 05/29/2017    Priority: Medium  . Adjustment disorder with mixed anxiety and depressed mood     Priority: Medium  . Alteration of sensation as late effect  of stroke 06/25/2017    Priority: Low  . Gait disturbance, post-stroke 05/14/2017    Priority: Low  . Vitreous hemorrhage of right eye ([name])     Priority: Low  . s/p SAH (subarachnoid hemorrhage) ([name]) 04/19/2017    Priority: Low  . Duodenal ulcer, acute with obstruction 10/17/2011    Priority: Low  . Low TSH level 03/25/2019  . GAD (generalized anxiety disorder) 03/25/2019  . Trimalleolar fracture of ankle, closed, right, initial encounter 11/27/2018  . Tobacco use disorder 11/27/2018  . Osteoarthritis of right hip 10/07/2018  . 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 [facility]iness-  [facility] 08/07/2017  .  Disturbances of vision, late effect of stroke 08/06/2017  . Health education/counseling 08/05/2017  . High risk medications (not anticoagulants) long-term use 08/05/2017  . [employer] (NIH) Stroke Scale limb ataxia score 2, ataxia present in two limbs 06/25/2017  . Elevated vitamin B12 level 05/30/2017  . 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 Hypoxia   . Elevated gastrin level 01/25/2012  . Hypokalemia 01/21/2012  . Nausea & vomiting 01/20/2012  . Epigastric pain 01/20/2012  . S/P laparoscopic cholecystectomy 10/16/2011     Current Meds  Medication Sig  . amLODipine (NORVASC) 5 MG tablet TAKE 1 TABLET (5 MG TOTAL) BY MOUTH DAILY. PATIENT NEEDS OFFICE VISIT FOR FURTHER REFILLS  . atorvastatin (LIPITOR) 20 MG tablet TAKE 1 TABLET BY MOUTH EVERYDAY AT BEDTIME  . FLUoxetine (PROZAC) 20 MG capsule Take 3 capsules (60 mg total) by mouth daily. (Patient taking differently: Take 40 mg by mouth daily. )  . gabapentin (NEURONTIN) 600 MG tablet TAKE 1 TABLET BY MOUTH 4 TIMES DAILY.  . Multiple Vitamin (MULTIVITAMIN WITH MINERALS) TABS tablet Take 1 tablet by mouth daily.  . Vitamin D, Ergocalciferol, (DRISDOL) 50000 units CAPS capsule Take 1 capsule (50,000 Units total) by mouth every 7 (seven) days.    Allergies:  Allergies  Allergen Reactions  . Nsaids Other (See Comments)    Pt diagnosed with near-obstructing circumferential ulcer of duodenum Nov2012    ROS:  See above HPI for pertinent positives and negatives   Objective:   Height 5\' 4"  (1.626 m), weight 141 lb 14.4 oz (64.4 kg). (if some vitals are omitted, this means that patient was UNABLE to obtain them even though asked to get them prior to OV today) General: sounds in no acute distress.  Skin: Pt confirms warm and dry  extremities and pink fingertips Respiratory: speaking in full sentences, no conversational dyspnea Psych: A and O *3,  appears insight good, mood- full      Impression and Recommendations:      ICD-10-CM   1. Benign essential HTN I10   2. Elevated LDL cholesterol level E78.00   3. Adjustment disorder with mixed anxiety and depressed mood F43.23 busPIRone (BUSPAR) 5 MG tablet  4. GAD (generalized anxiety disorder) F41.1 busPIRone (BUSPAR) 5 MG tablet  5. Vitamin D deficiency E55.9   6. Low TSH level R79.89 T4, free    T3, free    TSH    -Check blood pressure at home regularly.  Keep a log and write it down.  Goal blood pressures discussed.  -Low saturated trans-fat diet in addition to medications.  Goal levels discussed with patient.  - started BuSpar for anxiety.   - low tsh with pt feeling anxious/ gittery- we will just reck to make sure  thyroid levels are stable and she is not becoming very hyperthyroid which could be contributing to her symptoms.  As part of my medical decision making, I reviewed the following data within the electronic MEDICAL RECORD NUMBER History obtained from pt/family, CMA notes reviewed and incorporated, Labs reviewed, Radiograph/ tests reviewed if applicable and OV notes from prior OV's with me, as well as other specialists he has seen since seeing me last, were all reviewed and used in my medical decision making process today. Additionally, discussion had with patient regarding txmnt plan, their biases about that plan etc were used in my medical decision making today.  I discussed the assessment and treatment plan with the patient. The patient was provided an opportunity to ask questions and all were answered.  The patient agreed with the plan and demonstrated an understanding of the instructions.   No barriers to understanding were identified.  Red flag symptoms and signs discussed in detail.  Patient expressed understanding regarding what to do in case of emergency\urgent symptoms   The patient was advised to call back or seek an in-person evaluation if the symptoms worsen or if  the condition fails to improve as anticipated.   Return for lab-only: Thyroid labs in 4-5wks, then 1 wk later, OV to review & started buspar.    Orders Placed This Encounter  Procedures  . T4, free  . T3, free  . TSH    Meds ordered this encounter  Medications  . busPIRone (BUSPAR) 5 MG tablet    Sig: Take 1 tablet (5 mg total) by mouth 3 (three) times daily.    Dispense:  90 tablet    Refill:  0      **Gross side effects, risk and benefits, and alternatives of medications and treatment plan in general discussed with patient.  Patient is aware that all medications have potential side effects and we are unable to predict every side effect or drug-drug interaction that may occur.   Patient was strongly encouraged to call with any questions or concerns they may have concerns.     I provided 26+ minutes of non-face-to-face time during this encounter.   Thomasene Lot, DO

## 2019-03-26 ENCOUNTER — Ambulatory Visit: Payer: BLUE CROSS/BLUE SHIELD | Admitting: Physical Therapy

## 2019-03-30 ENCOUNTER — Ambulatory Visit: Payer: BLUE CROSS/BLUE SHIELD | Admitting: Family Medicine

## 2019-03-31 ENCOUNTER — Ambulatory Visit: Payer: BLUE CROSS/BLUE SHIELD | Admitting: Physical Therapy

## 2019-04-02 ENCOUNTER — Ambulatory Visit: Payer: BLUE CROSS/BLUE SHIELD | Admitting: Physical Therapy

## 2019-04-06 ENCOUNTER — Ambulatory Visit: Payer: BLUE CROSS/BLUE SHIELD | Admitting: Physical Therapy

## 2019-04-09 ENCOUNTER — Ambulatory Visit: Payer: BLUE CROSS/BLUE SHIELD | Admitting: Physical Therapy

## 2019-04-16 ENCOUNTER — Other Ambulatory Visit: Payer: Self-pay | Admitting: Family Medicine

## 2019-04-16 DIAGNOSIS — F4323 Adjustment disorder with mixed anxiety and depressed mood: Secondary | ICD-10-CM

## 2019-04-16 DIAGNOSIS — F411 Generalized anxiety disorder: Secondary | ICD-10-CM

## 2019-04-16 NOTE — Telephone Encounter (Signed)
Pt's last OV was 03/25/2019.  RX sent for #90 (30 day supply) on same date.  Please review for appropriateness of refill.  OV note is not complete and I am unsure re: f/u OVs.  Thanks!  Tiajuana Amass, CMA

## 2019-04-16 NOTE — Telephone Encounter (Signed)
I will see what the deal is and take care of it.  Thanks

## 2019-04-23 ENCOUNTER — Other Ambulatory Visit: Payer: Self-pay

## 2019-04-23 ENCOUNTER — Other Ambulatory Visit (INDEPENDENT_AMBULATORY_CARE_PROVIDER_SITE_OTHER): Payer: BLUE CROSS/BLUE SHIELD

## 2019-04-23 DIAGNOSIS — R7989 Other specified abnormal findings of blood chemistry: Secondary | ICD-10-CM

## 2019-04-24 LAB — TSH: TSH: 0.806 u[IU]/mL (ref 0.450–4.500)

## 2019-04-24 LAB — T3, FREE: T3, Free: 3 pg/mL (ref 2.0–4.4)

## 2019-04-24 LAB — T4, FREE: Free T4: 0.95 ng/dL (ref 0.82–1.77)

## 2019-04-28 ENCOUNTER — Ambulatory Visit (INDEPENDENT_AMBULATORY_CARE_PROVIDER_SITE_OTHER): Payer: BLUE CROSS/BLUE SHIELD | Admitting: Family Medicine

## 2019-04-28 ENCOUNTER — Encounter: Payer: Self-pay | Admitting: Family Medicine

## 2019-04-28 ENCOUNTER — Other Ambulatory Visit: Payer: Self-pay

## 2019-04-28 VITALS — Ht 64.0 in | Wt 145.0 lb

## 2019-04-28 DIAGNOSIS — R7989 Other specified abnormal findings of blood chemistry: Secondary | ICD-10-CM

## 2019-04-28 DIAGNOSIS — F4323 Adjustment disorder with mixed anxiety and depressed mood: Secondary | ICD-10-CM

## 2019-04-28 DIAGNOSIS — I10 Essential (primary) hypertension: Secondary | ICD-10-CM | POA: Diagnosis not present

## 2019-04-28 DIAGNOSIS — F411 Generalized anxiety disorder: Secondary | ICD-10-CM

## 2019-04-28 MED ORDER — BUSPIRONE HCL 5 MG PO TABS: 5.0000 mg | ORAL_TABLET | Freq: Three times a day (TID) | ORAL | 1 refills | Status: DC

## 2019-04-28 MED ORDER — FLUOXETINE HCL 20 MG PO CAPS: 20.0000 mg | ORAL_CAPSULE | Freq: Two times a day (BID) | ORAL | 1 refills | Status: DC

## 2019-04-28 NOTE — Progress Notes (Signed)
Telehealth office visit note for [name] Barnett, D.O- at Primary Care at [facility]   I connected with current patient today and verified that I am speaking with the correct person using two identifiers.   . Location of the patient: Home . Location of the provider: Office Only the patient (+/- their family members at pt's discretion) and myself were participating in the encounter    - This visit type was conducted due to national recommendations for restrictions regarding the COVID-19 Pandemic (e.g. social distancing) in an effort to limit this patient's exposure and mitigate transmission in our community.  This format is felt to be most appropriate for this patient at this time.   - The patient did not have access to video technology or had technical difficulties with video requiring transitioning to audio format only. - No physical exam could be performed with this format, beyond that communicated to [place] by the patient/ family members as noted.   - Additionally my office staff/ schedulers discussed with the patient that there may be a monetary charge related to this service, depending on their medical insurance.   The patient expressed understanding, and agreed to proceed.       History of Present Illness: Last office visit was 03/25/2019.  She was feeling anxious and lonely etc.  At that time in addition to her SSRI we started BuSpar.   Pt tolerating Buspar - feels much better on it.  Not anxious any more.  Lets things roll off her back easily now.     Also we rechecked her thyroid labs on 04/23/19 to make sure this was not contributing to her anxiousness. R/w pt today- all WNL's.   She has not checked her blood pressure since last office visit as instructed.  Her machine is "wacky and levels are all over the place."  No Sx though, feels well      Impression and Recommendations:    1. Adjustment disorder with mixed anxiety and depressed mood   2. GAD (generalized anxiety  disorder)   3. Benign essential HTN   4. Low TSH level     - cont meds - Extensively discussed importance of diet/ exercise as pertains to pt's Bp, Mood etc.  All pt's questions/ concerns were addressed.  Told to call back if needed - needs for health maintenance exams etc r/w pt.   - As part of my medical decision making, I reviewed the following data within the electronic MEDICAL RECORD NUMBER History obtained from pt /family, CMA notes reviewed and incorporated if applicable, Labs reviewed, Radiograph/ tests reviewed if applicable and OV notes from prior OV's with me, as well as other specialists she/he has seen since seeing me last, were all reviewed and used in my medical decision making process today.   - Additionally, discussion had with patient regarding txmnt plan, and their biases/concerns about that plan were used in my medical decision making today.   - The patient agreed with the plan and demonstrated an understanding of the instructions.   No barriers to understanding were identified.   - Red flag symptoms and signs discussed in detail.  Patient expressed understanding regarding what to do in case of emergency\ urgent symptoms.  The patient was advised to call back or seek an in-person evaluation if the symptoms worsen or if the condition fails to improve as anticipated.   Return for reminded pt importance of yrly PEs; also q 25mo- BP, mood etc- needs to  bring in log of BPs.     Meds ordered this encounter  Medications  . FLUoxetine (PROZAC) 20 MG capsule    Sig: Take 1 capsule (20 mg total) by mouth 2 (two) times daily.    Dispense:  180 capsule    Refill:  1  . busPIRone (BUSPAR) 5 MG tablet    Sig: Take 1 tablet (5 mg total) by mouth 3 (three) times daily.    Dispense:  270 tablet    Refill:  1    Medications Discontinued During This Encounter  Medication Reason  . FLUoxetine (PROZAC) 20 MG capsule   . busPIRone (BUSPAR) 5 MG tablet Reorder      I provided *20+ minutes  of non-face-to-face time during this encounter,with over 50% of the time in direct counseling on patients medical conditions/ medical concerns.  Additional time was spent with charting and coordination of care after the actual visit commenced.   Note:  This note was prepared with assistance of Dragon voice recognition software. Occasional wrong-word or sound-a-like substitutions may have occurred due to the inherent limitations of voice recognition software.  [name] Loteborah Kellis Topete, DO 04/28/19    Patient Care Team    Relationship Specialty Notifications Start End  [name] Barnett, [name] Timoney, DO PCP - General Family Medicine  04/30/17   Charlott RakesSchooler, Vincent, MD Consulting Physician Gastroenterology  10/17/11   Lisbeth RenshawNundkumar, Neelesh, MD Consulting Physician Neurosurgery  05/21/17   Erick ColaceKirsteins, Andrew E, MD Consulting Physician Physical Medicine and Rehabilitation  05/21/17   Olivia Mackieaavon, Richard, MD Consulting Physician Obstetrics and Gynecology  05/21/17   Carmela RimaPatel, Narendra, MD Consulting Physician Ophthalmology  06/13/17    Comment: Neuro ophthalmologist  Sheral ApleyMurphy, Timothy D, MD Attending Physician Orthopedic Surgery  11/27/18      -Vitals obtained; medications/ allergies reconciled;  personal medical, social, Sx etc.histories were updated by CMA, reviewed by me and are reflected in chart   Patient Active Problem List   Diagnosis Date Noted  . Neuritis-  R sided:  arm and leg/ body due to stroke 08/05/2017    Priority: High  . Elevated LDL cholesterol level 07/11/2017    Priority: High  . History of tobacco abuse-  30pk yr hx - quit 04/07/17 05/21/2017    Priority: High  . Benign essential HTN     Priority: High  . Basilar artery aneurysm ([name])     Priority: High  . 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 CVA (cerebral vascular accident) ([name]) 04/07/2017    Priority: High  . Vitamin D deficiency 05/29/2017    Priority: Medium  . Adjustment disorder with mixed anxiety  and depressed mood     Priority: Medium  . 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 Gait disturbance, post-stroke 05/14/2017    Priority: Low  . Vitreous hemorrhage of right eye ([name])     Priority: Low  . s/p SAH (subarachnoid hemorrhage) ([name]) 04/19/2017    Priority: Low  . Duodenal ulcer, acute with obstruction 10/17/2011    Priority: Low  . Low TSH level 03/25/2019  . GAD (generalized anxiety disorder) 03/25/2019  . Trimalleolar fracture of ankle, closed, right, initial encounter 11/27/2018  . Tobacco use disorder 11/27/2018  . Osteoarthritis of right hip 10/07/2018  . 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 [facility]iness[facility]  [facility] 08/07/2017  . Disturbances of vision, late effect of stroke 08/06/2017  . Health education/counseling 08/05/2017  .  High risk medications (not anticoagulants) long-term use 08/05/2017  . [employer] (NIH) Stroke Scale limb ataxia score 2, ataxia present in two limbs 06/25/2017  . Elevated vitamin B12 level 05/30/2017  . 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 Hypoxia   . Elevated gastrin level 01/25/2012  . Hypokalemia 01/21/2012  . Nausea & vomiting 01/20/2012  . Epigastric pain 01/20/2012  . S/P laparoscopic cholecystectomy 10/16/2011     Current Meds  Medication Sig  . amLODipine (NORVASC) 5 MG tablet TAKE 1 TABLET (5 MG TOTAL) BY MOUTH DAILY. PATIENT NEEDS OFFICE VISIT FOR FURTHER REFILLS  . atorvastatin (LIPITOR) 20 MG tablet TAKE 1 TABLET BY MOUTH EVERYDAY AT BEDTIME  . busPIRone (BUSPAR) 5 MG tablet Take 1 tablet (5 mg total) by mouth 3 (three) times daily.  [name] FLUoxetine (PROZAC) 20 MG capsule Take 1 capsule (20 mg total) by mouth 2 (two) times daily.  [name] gabapentin (NEURONTIN) 600 MG tablet TAKE 1 TABLET BY MOUTH 4 TIMES DAILY. (Patient taking differently: Take 600 mg by mouth 2 (two) times  daily. )  . Multiple Vitamin (MULTIVITAMIN WITH MINERALS) TABS tablet Take 1 tablet by mouth daily.  . Vitamin D, Ergocalciferol, (DRISDOL) 50000 units CAPS capsule Take 1 capsule (50,000 Units total) by mouth every 7 (seven) days.  . [DISCONTINUED] busPIRone (BUSPAR) 5 MG tablet TAKE 1 TABLET BY MOUTH THREE TIMES A DAY  . [DISCONTINUED] FLUoxetine (PROZAC) 20 MG capsule Take 3 capsules (60 mg total) by mouth daily. (Patient taking differently: Take 40 mg by mouth 2 (two) times daily. )     Allergies:  Allergies  Allergen Reactions  . Nsaids Other (See Comments)    Pt diagnosed with near-obstructing circumferential ulcer of duodenum Nov2012     ROS:  See above HPI for pertinent positives and negatives   Objective:   Height  (1.626 m), weight 145 lb (65.8 kg).  (if some vitals are omitted, this means that patient was UNABLE to obtain them even though they were asked to get them prior to OV today.  They were asked to call us at their earliest convenience with these once obtained. )  General: A & O * 3; sounds in no acute distress; in usual state of health.  Skin: Pt confirms warm and dry extremities and pink fingertips HEENT: Pt confirms lips non-cyanotic Chest: Patient confirms normal chest excursion and movement Respiratory: speaking in full sentences, no conversational dyspnea; patient confirms no use of accessory muscles Psych: insight appears good, mood- appears full

## 2019-06-10 ENCOUNTER — Other Ambulatory Visit: Payer: Self-pay | Admitting: Family Medicine

## 2019-06-10 DIAGNOSIS — I10 Essential (primary) hypertension: Secondary | ICD-10-CM

## 2019-06-25 ENCOUNTER — Other Ambulatory Visit: Payer: Self-pay | Admitting: Family Medicine

## 2019-06-25 DIAGNOSIS — F4323 Adjustment disorder with mixed anxiety and depressed mood: Secondary | ICD-10-CM

## 2019-07-12 ENCOUNTER — Other Ambulatory Visit: Payer: Self-pay | Admitting: Family Medicine

## 2019-09-08 ENCOUNTER — Other Ambulatory Visit: Payer: Self-pay | Admitting: Physical Medicine & Rehabilitation

## 2019-10-13 ENCOUNTER — Telehealth: Payer: Self-pay

## 2019-10-13 ENCOUNTER — Other Ambulatory Visit: Payer: Self-pay | Admitting: Family Medicine

## 2019-10-13 DIAGNOSIS — F4323 Adjustment disorder with mixed anxiety and depressed mood: Secondary | ICD-10-CM

## 2019-10-13 NOTE — Telephone Encounter (Signed)
Please call pt to schedule appt.  No further refills until pt is seen.  T. Raeya Merritts, CMA  

## 2019-11-20 ENCOUNTER — Ambulatory Visit: Payer: BLUE CROSS/BLUE SHIELD | Admitting: Family Medicine

## 2019-11-20 ENCOUNTER — Other Ambulatory Visit: Payer: Self-pay

## 2019-11-23 ENCOUNTER — Encounter: Payer: Self-pay | Admitting: Family Medicine

## 2019-11-23 ENCOUNTER — Ambulatory Visit (INDEPENDENT_AMBULATORY_CARE_PROVIDER_SITE_OTHER): Payer: BC Managed Care – PPO | Admitting: Family Medicine

## 2019-11-23 ENCOUNTER — Other Ambulatory Visit: Payer: Self-pay

## 2019-11-23 VITALS — Wt 140.6 lb

## 2019-11-23 DIAGNOSIS — I609 Nontraumatic subarachnoid hemorrhage, unspecified: Secondary | ICD-10-CM

## 2019-11-23 DIAGNOSIS — F4323 Adjustment disorder with mixed anxiety and depressed mood: Secondary | ICD-10-CM

## 2019-11-23 DIAGNOSIS — I725 Aneurysm of other precerebral arteries: Secondary | ICD-10-CM

## 2019-11-23 DIAGNOSIS — F17200 Nicotine dependence, unspecified, uncomplicated: Secondary | ICD-10-CM

## 2019-11-23 DIAGNOSIS — Z716 Tobacco abuse counseling: Secondary | ICD-10-CM | POA: Diagnosis not present

## 2019-11-23 DIAGNOSIS — I10 Essential (primary) hypertension: Secondary | ICD-10-CM | POA: Diagnosis not present

## 2019-11-23 DIAGNOSIS — F411 Generalized anxiety disorder: Secondary | ICD-10-CM

## 2019-11-23 DIAGNOSIS — E559 Vitamin D deficiency, unspecified: Secondary | ICD-10-CM

## 2019-11-23 DIAGNOSIS — E785 Hyperlipidemia, unspecified: Secondary | ICD-10-CM

## 2019-11-23 MED ORDER — AMLODIPINE BESYLATE 5 MG PO TABS: 5.0000 mg | ORAL_TABLET | Freq: Every day | ORAL | 1 refills | Status: DC

## 2019-11-23 MED ORDER — VITAMIN D (ERGOCALCIFEROL) 1.25 MG (50000 UNIT) PO CAPS: 50000.0000 [IU] | ORAL_CAPSULE | ORAL | 3 refills | Status: DC

## 2019-11-23 MED ORDER — BUSPIRONE HCL 5 MG PO TABS: 5.0000 mg | ORAL_TABLET | Freq: Three times a day (TID) | ORAL | 1 refills | Status: AC

## 2019-11-23 MED ORDER — FLUOXETINE HCL 20 MG PO CAPS: 60.0000 mg | ORAL_CAPSULE | Freq: Every day | ORAL | 1 refills | Status: DC

## 2019-11-23 MED ORDER — ATORVASTATIN CALCIUM 20 MG PO TABS: ORAL_TABLET | ORAL | 1 refills | Status: DC

## 2019-11-23 NOTE — Progress Notes (Signed)
Telehealth office visit note for [name], D.O- at Primary Care at [facility]   I connected with current patient today and verified that I am speaking with the correct person using two identifiers.   . Location of the patient: Home . Location of the provider: Office Only the patient (+/- their family members at pt's discretion) and myself were participating in the encounter - This visit type was conducted due to national recommendations for restrictions regarding the COVID-19 Pandemic (e.g. social distancing) in an effort to limit this patient's exposure and mitigate transmission in our community.  This format is felt to be most appropriate for this patient at this time.   - The patient did not have access to video technology or had technical difficulties with video requiring transitioning to audio format only. - No physical exam could be performed with this format, beyond that communicated to [place] by the patient/ family members as noted.   - Additionally my office staff/ schedulers discussed with the patient that there may be a monetary charge related to this service, depending on their medical insurance.   The patient expressed understanding, and agreed to proceed.      History of Present Illness: I, Toni Amend, am serving as scribe for Dr. [name].   Notes [name] Barnett is doing good, as are her daughter and son.  Notes her son and his wife are pregnant finally.  [name] continues to manage her weekly pillbox.  - Lifestyle & Mood During COVID-19 Notes everyone in her family is taking care during COVID-19; "everyone is going to be home until this crap is over.  We take it seriously."  States her mood is fine.  Says "COVID sucks, and I'm tired of it being here," but overall notes "I'm fine."  Says "I've only had one rough day since I last talked to you."  She continues taking 5 mg Buspar and tolerates well.  - Tobacco Abuse & Smoking Cessation Goals With their  pregnancy, her son and daughter-in-law are "no smoking at all, so that is changing how I smoke, and by the time the baby comes, I have to be totally quit."  Says her son and daughter-in-law have told her they won't come around with the baby unless the patient quits smoking.  Says she thinks she's going to quit smoking by the end of the month.  "This has been coming since August."  Says she's "done" now.  Notes "My fifteen-year-old hates it too."  States she thinks she can just put down the cigarettes and quit smoking.  Says she bought the patches and suckers just in case, but "I can go without smoking; it's just a habit for me, that's all it is."  Feels "I just have to break that habit of the hand to mouth.  If I have something to do or something to look forward to, I know that I can do this."  Reiterates "it's just time [for me to [place]."  Notes "when I run out (of cigarettes), I'm out; I'm not going to buy anymore.  My mind's made, that's it, I'm done, so bring it on."  HPI:  Hypertension:  - Doesn't check her blood pressure at home, but assumes it's good.  Notes "I just don't think about it."  - Patient reports good compliance with medication and/or lifestyle modification.  Notes "I haven't missed a pill since you've given it to me."  Says "everything that you have given me, I take  every day and every night; however you told me to do it, I do it, aside from checking my blood pressure."  - Her denies acute concerns or problems related to treatment plan  - She denies new onset of: chest pain, exercise intolerance, shortness of breath, dizziness, visual changes, headache, lower extremity swelling or claudication.   Last 3 blood pressure readings in our office are as follows: BP Readings from Last 3 Encounters:  01/08/19 126/77  01/06/19 135/84  11/27/18 128/82   Filed Weights   11/23/19 1145  Weight: 140 lb 9.6 oz (63.8 kg)    HPI:  Hyperlipidemia:  43 y.o. female here for cholesterol  follow-up.   - Patient reports good compliance with treatment plan of:  medication and/ or lifestyle management.    - Patient denies any acute concerns or problems with management plan   - She denies new onset of: myalgias, arthralgias, increased fatigue more than normal, chest pains, exercise intolerance, shortness of breath, dizziness, visual changes, headache, lower extremity swelling or claudication.   Most recent cholesterol panel was:  Lab Results  Component Value Date   CHOL 165 03/23/2019   HDL 65 03/23/2019   LDLCALC 75 03/23/2019   TRIG 123 03/23/2019   CHOLHDL 2.5 03/23/2019   Hepatic Function Latest Ref Rng & Units 03/23/2019 02/14/2018 09/12/2017  Total Protein 6.0 - 8.5 g/dL 5.8(L) 6.2 -  Albumin 3.8 - 4.8 g/dL 3.4(L) 4.1 -  AST 0 - 40 IU/L 9 13 -  ALT 0 - 32 IU/L 11 13 21   Alk Phosphatase 39 - 117 IU/L 145(H) 111 -  Total Bilirubin 0.0 - 1.2 mg/dL <0.2 <0.2 -        GAD 7 : Generalized Anxiety Score 04/28/2019 08/01/2017  Nervous, Anxious, on Edge 0 0  Control/stop worrying 0 0  Worry too much - different things 0 0  Trouble relaxing 2 0  Restless 3 1  Easily annoyed or irritable 0 0  Afraid - awful might happen 0 0  Total GAD 7 Score 5 1  Anxiety Difficulty Somewhat difficult Somewhat difficult    Depression screen [facility] 2/9 11/23/2019 04/28/2019 03/25/2019 11/27/2018 05/21/2018  Decreased Interest 0 0 0 0 0  Down, Depressed, Hopeless 0 0 0 0 0  PHQ - 2 Score 0 0 0 0 0  Altered sleeping 1 0 0 0 1  Tired, decreased energy 1 0 0 0 1  Change in appetite 0 0 0 0 1  Feeling bad or failure about yourself  0 0 0 0 0  Trouble concentrating 0 0 0 0 0  Moving slowly or fidgety/restless 0 2 0 0 0  Suicidal thoughts 0 0 0 0 0  PHQ-9 Score 2 2 0 0 3  Difficult doing work/chores Not difficult at all Somewhat difficult Not difficult at all Not difficult at all Not difficult at all  Some recent data might be hidden      Impression and Recommendations:    1. Benign  essential HTN   2. Hyperlipidemia, unspecified hyperlipidemia type   3. Tobacco abuse counseling   4. Tobacco use disorder   5. Adjustment disorder with mixed anxiety and depressed mood   6. GAD (generalized anxiety disorder)   7. 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 HTN, goal below 130/80   10. Vitamin D deficiency      Tobacco Use Disorder - Tobacco Abuse Counseling, Smoking Cessation Goals - Told pt to  think seriously about quitting smoking!  Told pt it is very important for his/her health and well being.   - Smoking cessation instruction/ counseling given of at least 5 minutes:  counseled patient on the dangers of tobacco use and reviewed strategies to maximize success  - Discussed with patient that there are multiple treatments to aid in quitting smoking, however I explained none will work unless pt has a plan and really wants to quit.  - Told to call 1-800-QUIT-NOW 445-223-8022) for free smoking cessation counseling and support, or pt can go online to www.heart.org - the American Heart Association website and search "quit smoking ".   - Will continue to monitor.  Benign Essential Hypertension, s/p SAH, Basilar Artery Aneurysm - Patient not currently monitoring BP at home. - Encouraged patient to obtain an OMRON upper arm blood pressure cuff.  - Patient will continue current treatment regimen.  See med list.  - Counseled patient on pathophysiology of disease and discussed various treatment options, which always includes dietary and lifestyle modification as first line.   - Lifestyle changes such as dash and heart healthy diets and engaging in a regular exercise program discussed extensively with patient.   - Ambulatory blood pressure monitoring encouraged at least 3 times weekly.  Keep log and bring in every office visit.  Reminded patient that if they ever feel poorly in any way, to check their blood pressure and pulse.  -  Handouts provided at patient's desire and/or told to go online at the [place] website for further information  - We will continue to monitor  Hyperlipidemia - Cholesterol levels at goal last check.  - Pt will continue current treatment regimen.  See med list.  - Dietary changes such as low saturated & trans fat diets for hyperlipidemia and low carb diets for hypertriglyceridemia discussed with patient.    - Encouraged patient to follow AHA guidelines for regular exercise and also engage in weight loss if BMI above 25.   - Educational handouts provided at patient's desire and/ or told to look online at the [place] website for further information.  - We will continue to monitor.  Adjustment Disorder w/ Mixed Anxiety & Depressed Mood, GAD - Stable at this time on current management. - Continue treatment plan as established.  See med list.  - Pt continues 5 mg Buspar TID and tolerating well.  - Will continue to monitor.  COVID-19 Counseling - Novel Covid -19 counseling done; all questions were answered.   - Current CDC / federal and [place] guidelines reviewed with patient  - Reminded pt of extreme importance of social distancing; wearing a mask when out in public; insensate handwashing and cleaning of surfaces, avoiding unnecessary trips for shopping and avoiding ALL but emergency appts etc. - Told patient to be prepared, not scared; and be smart for the sake of others - Patient will call with any additional concerns  Recommendations - Need for CPE in 6-8 weeks. - Reviewed need to update screenings and immunizations as recommended. - Discussed goal of patient being smoke free for a month when she returns for next OV.  - As part of my medical decision making, I reviewed the following data within the [place] History obtained from pt /family, CMA notes reviewed and incorporated if applicable, Labs reviewed, Radiograph/ tests  reviewed if applicable and OV notes from prior OV's with me, as well as other specialists she/he has seen since seeing me last, were all  reviewed and used in my medical decision making process today.    - Additionally, discussion had with patient regarding our treatment plan, and their biases/concerns about that plan were used in my medical decision making today.    - The patient agreed with the plan and demonstrated an understanding of the instructions.   No barriers to understanding were identified.    - Red flag symptoms and signs discussed in detail.  Patient expressed understanding regarding what to do in case of emergency\ urgent symptoms.   - The patient was advised to call back or seek an in-person evaluation if the symptoms worsen or if the condition fails to improve as anticipated.   Return for CPE in 6-8 weeks, review smoking cessation goals.     Meds ordered this encounter  Medications  . Vitamin D, Ergocalciferol, (DRISDOL) 1.25 MG (50000 UT) CAPS capsule    Sig: Take 1 capsule (50,000 Units total) by mouth every 7 (seven) days.    Dispense:  12 capsule    Refill:  3  . busPIRone (BUSPAR) 5 MG tablet    Sig: Take 1 tablet (5 mg total) by mouth 3 (three) times daily.    Dispense:  270 tablet    Refill:  1  . amLODipine (NORVASC) 5 MG tablet    Sig: Take 1 tablet (5 mg total) by mouth daily.    Dispense:  90 tablet    Refill:  1  . atorvastatin (LIPITOR) 20 MG tablet    Sig: TAKE 1 TABLET BY MOUTH EVERYDAY AT BEDTIME    Dispense:  90 tablet    Refill:  1  . FLUoxetine (PROZAC) 20 MG capsule    Sig: Take 3 capsules (60 mg total) by mouth daily.    Dispense:  270 capsule    Refill:  1    Medications Discontinued During This Encounter  Medication Reason  . Vitamin D, Ergocalciferol, (DRISDOL) 50000 units CAPS capsule Reorder  . busPIRone (BUSPAR) 5 MG tablet Reorder  . amLODipine (NORVASC) 5 MG tablet Reorder  . atorvastatin (LIPITOR) 20 MG tablet Reorder  .  FLUoxetine (PROZAC) 20 MG capsule Reorder     I provided 17+ minutes of non face-to-face time during this encounter.  Additional time was spent with charting and coordination of care before and after the actual visit commenced.   Note:  This note was prepared with assistance of Dragon voice recognition software. Occasional wrong-word or sound-a-like substitutions may have occurred due to the inherent limitations of voice recognition software.  This document serves as a record of services personally performed by [name], DO. It was created on her behalf by Toni Amend, a trained medical scribe. The creation of this record is based on the scribe's personal observations and the provider's statements to them.   This case required medical decision making of at least moderate complexity. The above documentation has been reviewed to be accurate and was completed by Marjory Sneddon, D.O.       Patient Care Team    Relationship Specialty Notifications Start End  [name], DO PCP - General Family Medicine  04/30/17   Wilford Corner, MD Consulting Physician Gastroenterology  10/17/11   Consuella Lose, MD Consulting Physician Neurosurgery  05/21/17   Charlett Blake, MD Consulting Physician Physical Medicine and Rehabilitation  05/21/17   Brien Few, MD Consulting Physician Obstetrics and Gynecology  05/21/17   Jalene Mullet, MD Consulting Physician Ophthalmology  06/13/17    Comment: Neuro ophthalmologist  Renette Butters, MD Attending Physician Orthopedic Surgery  11/27/18      -Vitals obtained; medications/ allergies reconciled;  personal medical, social, Sx etc.histories were updated by CMA, reviewed by me and are reflected in chart   Patient Active Problem List   Diagnosis Date Noted  . Neuritis-  R sided:  arm and leg/ body due to stroke 08/05/2017  . Elevated LDL cholesterol level 07/11/2017  . History of tobacco abuse-  30pk yr hx - quit 04/07/17  05/21/2017  . Benign essential HTN   . 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 Subarachnoid hemorrhage due to ruptured aneurysm ([place]) 04/07/2017  . CVA (cerebral vascular accident) ([place]) 04/07/2017  . Vitamin D deficiency 05/29/2017  . Adjustment disorder with mixed anxiety and depressed mood   . 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 Gait disturbance, post-stroke 05/14/2017  . Vitreous hemorrhage of right eye ([place])   . s/p SAH (subarachnoid hemorrhage) ([place]) 04/19/2017  . Duodenal ulcer, acute with obstruction 10/17/2011  . Low TSH level 03/25/2019  . GAD (generalized anxiety disorder) 03/25/2019  . Trimalleolar fracture of ankle, closed, right, initial encounter 11/27/2018  . Tobacco use disorder 11/27/2018  . Osteoarthritis of right hip 10/07/2018  . 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 Weakness with dizziness-  since [facility] and CVA 04/07/17 08/07/2017  . Disturbances of vision, late effect of stroke 08/06/2017  . Health education/counseling 08/05/2017  . High risk medications (not anticoagulants) long-term use 08/05/2017  . [employer] (NIH) Stroke Scale limb ataxia score 2, ataxia present in two limbs 06/25/2017  . Elevated vitamin B12 level 05/30/2017  . 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 Hypoxia   . Elevated gastrin level 01/25/2012  . Hypokalemia 01/21/2012  . Nausea & vomiting 01/20/2012  . Epigastric pain 01/20/2012  . S/P laparoscopic cholecystectomy 10/16/2011     Current Meds  Medication Sig  . amLODipine (NORVASC) 5 MG tablet Take 1 tablet (5 mg total) by mouth daily.  [name] atorvastatin (LIPITOR) 20 MG tablet TAKE 1 TABLET BY MOUTH EVERYDAY AT BEDTIME  . busPIRone (BUSPAR) 5 MG tablet Take 1 tablet (5 mg total) by mouth 3 (three) times daily.  [name] FLUoxetine (PROZAC) 20 MG capsule Take 3 capsules (60 mg total) by mouth daily.  [name] gabapentin (NEURONTIN) 600 MG tablet  TAKE 1 TABLET BY MOUTH FOUR TIMES A DAY  . Multiple Vitamin (MULTIVITAMIN WITH MINERALS) TABS tablet Take 1 tablet by mouth daily.  . Vitamin D, Ergocalciferol, (DRISDOL) 1.25 MG (50000 UT) CAPS capsule Take 1 capsule (50,000 Units total) by mouth every 7 (seven) days.  . [DISCONTINUED] amLODipine (NORVASC) 5 MG tablet TAKE 1 TABLET (5 MG TOTAL) BY MOUTH DAILY. PATIENT NEEDS OFFICE VISIT FOR FURTHER REFILLS  . [DISCONTINUED] atorvastatin (LIPITOR) 20 MG tablet TAKE 1 TABLET BY MOUTH EVERYDAY AT BEDTIME  . [DISCONTINUED] busPIRone (BUSPAR) 5 MG tablet Take 1 tablet (5 mg total) by mouth 3 (three) times daily.  . [DISCONTINUED] FLUoxetine (PROZAC) 20 MG capsule Take 3 capsules (60 mg total) by mouth daily. OFFICE VISIT REQUIRED PRIOR TO ANY FURTHER REFILLS.  . [DISCONTINUED] Vitamin D, Ergocalciferol, (DRISDOL) 50000 units CAPS capsule Take 1 capsule (50,000 Units total) by mouth every 7 (seven) days.     Allergies:  Allergies  Allergen Reactions  . Nsaids Other (See Comments)    Pt diagnosed with near-obstructing circumferential ulcer of duodenum [id number]  ROS:  See above HPI for pertinent positives and negatives   Objective:   Weight 140 lb 9.6 oz (63.8 kg), last menstrual period 10/24/2019.  (if some vitals are omitted, this means that patient was UNABLE to obtain them even though they were asked to get them prior to [place] today.  They were asked to call us at their earliest convenience with these once obtained. )  General: A & O * 3; sounds in no acute distress; in usual state of health.  Skin: Pt confirms warm and dry extremities and pink fingertips HEENT: Pt confirms lips non-cyanotic Chest: Patient confirms normal chest excursion and movement Respiratory: speaking in full sentences, no conversational dyspnea; patient confirms no use of accessory muscles Psych: insight appears good, mood- appears full

## 2020-03-04 ENCOUNTER — Other Ambulatory Visit: Payer: Self-pay | Admitting: Physical Medicine & Rehabilitation

## 2020-03-04 NOTE — Telephone Encounter (Signed)
Needs to be refilled and managed by patients primary care provider as she is now follow up as needed.

## 2020-04-04 ENCOUNTER — Other Ambulatory Visit: Payer: Self-pay | Admitting: Family Medicine

## 2020-04-04 DIAGNOSIS — F4323 Adjustment disorder with mixed anxiety and depressed mood: Secondary | ICD-10-CM

## 2020-04-04 DIAGNOSIS — F411 Generalized anxiety disorder: Secondary | ICD-10-CM

## 2020-04-11 ENCOUNTER — Other Ambulatory Visit: Payer: Self-pay

## 2020-04-11 ENCOUNTER — Telehealth: Payer: Self-pay | Admitting: Physician Assistant

## 2020-04-11 DIAGNOSIS — Z79899 Other long term (current) drug therapy: Secondary | ICD-10-CM

## 2020-04-11 DIAGNOSIS — E876 Hypokalemia: Secondary | ICD-10-CM

## 2020-04-11 DIAGNOSIS — I10 Essential (primary) hypertension: Secondary | ICD-10-CM

## 2020-04-11 DIAGNOSIS — R7989 Other specified abnormal findings of blood chemistry: Secondary | ICD-10-CM

## 2020-04-11 DIAGNOSIS — Z0000 Encounter for general adult medical examination without abnormal findings: Secondary | ICD-10-CM

## 2020-04-11 DIAGNOSIS — E559 Vitamin D deficiency, unspecified: Secondary | ICD-10-CM

## 2020-04-11 DIAGNOSIS — E7800 Pure hypercholesterolemia, unspecified: Secondary | ICD-10-CM

## 2020-04-11 NOTE — Telephone Encounter (Signed)
Lab orders entered.  Informed pt that it is too early for refills on Buspar.  Pt expressed understanding.  Tiajuana Amass, CMA

## 2020-04-11 NOTE — Telephone Encounter (Signed)
Patient called requested refill on Buspar & also scheduled CPE w/ [facility] ( are labs needed if so Orders are also needed?? )  --Forwarding message to med asst for chart review for Lab orders  & also  Rx refill request.   busPIRone (BUSPAR) 5 MG tablet [[id number]]   Order Details Dose: 5 mg Route: Oral Frequency: 3 times daily  Dispense Quantity: 270 tablet Refills: 1       Sig: Take 1 tablet (5 mg total) by mouth 3 (three) times daily.      Start Date: 11/23/19 End Date: 05/21/20 after 540 doses  Written Date: 11/23/19 Expiration Date: 11/22/20    Diagnosis Association: Adjustment disorder with mixed anxiety and depressed mood (F43.23); GAD (generalized anxiety disorder) (F41.1)  Original Order:  busPIRone (BUSPAR) 5 MG tablet [[id number]]   --Pt uses :   [id number] [place], [place] - [address] [phone] (Phone) [phone] (Fax   -glh

## 2020-05-27 ENCOUNTER — Other Ambulatory Visit: Payer: BC Managed Care – PPO

## 2020-05-30 ENCOUNTER — Ambulatory Visit (INDEPENDENT_AMBULATORY_CARE_PROVIDER_SITE_OTHER): Payer: BC Managed Care – PPO | Admitting: Physician Assistant

## 2020-05-30 ENCOUNTER — Encounter: Payer: Self-pay | Admitting: Physician Assistant

## 2020-05-30 ENCOUNTER — Other Ambulatory Visit: Payer: Self-pay

## 2020-05-30 VITALS — BP 122/79 | HR 95 | Temp 98.3°F | Ht 63.0 in | Wt 136.3 lb

## 2020-05-30 DIAGNOSIS — E7800 Pure hypercholesterolemia, unspecified: Secondary | ICD-10-CM

## 2020-05-30 DIAGNOSIS — I639 Cerebral infarction, unspecified: Secondary | ICD-10-CM

## 2020-05-30 DIAGNOSIS — E876 Hypokalemia: Secondary | ICD-10-CM

## 2020-05-30 DIAGNOSIS — Z1231 Encounter for screening mammogram for malignant neoplasm of breast: Secondary | ICD-10-CM

## 2020-05-30 DIAGNOSIS — Z0000 Encounter for general adult medical examination without abnormal findings: Secondary | ICD-10-CM

## 2020-05-30 DIAGNOSIS — M792 Neuralgia and neuritis, unspecified: Secondary | ICD-10-CM

## 2020-05-30 DIAGNOSIS — Z01419 Encounter for gynecological examination (general) (routine) without abnormal findings: Secondary | ICD-10-CM

## 2020-05-30 DIAGNOSIS — I10 Essential (primary) hypertension: Secondary | ICD-10-CM | POA: Diagnosis not present

## 2020-05-30 DIAGNOSIS — I725 Aneurysm of other precerebral arteries: Secondary | ICD-10-CM

## 2020-05-30 DIAGNOSIS — R7989 Other specified abnormal findings of blood chemistry: Secondary | ICD-10-CM

## 2020-05-30 DIAGNOSIS — I69393 Ataxia following cerebral infarction: Secondary | ICD-10-CM

## 2020-05-30 DIAGNOSIS — R10819 Abdominal tenderness, unspecified site: Secondary | ICD-10-CM | POA: Diagnosis not present

## 2020-05-30 LAB — POCT URINALYSIS DIPSTICK
Bilirubin, UA: NEGATIVE
Glucose, UA: NEGATIVE
Ketones, UA: NEGATIVE
Leukocytes, UA: NEGATIVE
Nitrite, UA: NEGATIVE
Protein, UA: NEGATIVE
Spec Grav, UA: 1.025 (ref 1.010–1.025)
Urobilinogen, UA: 0.2 E.U./dL
pH, UA: 6 (ref 5.0–8.0)

## 2020-05-30 NOTE — Patient Instructions (Signed)

## 2020-05-30 NOTE — Progress Notes (Signed)
Female Physical   Impression and Recommendations:    1. Healthcare maintenance   2. Benign essential HTN   3. Encounter for screening mammogram for malignant neoplasm of breast   4. Suprapubic tenderness   5. Cerebrovascular accident (CVA), unspecified mechanism ([name])   6. Basilar artery aneurysm ([name])   7. Hypokalemia   8. Elevated LDL cholesterol level   9. Low TSH level   10. Well woman exam   11. Neuritis-  R sided:  arm and leg/ body due to stroke   12. Ataxia, post-stroke      1) Anticipatory Guidance: Discussed skin CA prevention and sunscreen when outside along with skin surveillance; eating a balanced and modest diet; physical activity at least 25 minutes per day or minimum of 150 min/ week moderate to intense activity. -Patient has mobility limitations due to prior CVA.  2) Immunizations / Screenings / Labs:   All immunizations are up-to-date per recommendations or will be updated today if pt allows.    - Patient understands with dental and vision screens they will schedule independently.  - Will obtain CBC, CMP, HgA1c, Lipid panel, and TSH when fasting, if not already done past 12 mo/ recently -Placed mammogram order. -Place referral to OB/GYN for Female exam including Pap. -UTD on Tdap, hep C and HIV screening.  3) Weight:  BMI meaning discussed with patient.  Discussed goal to improve diet habits to improve overall feelings of well being and objective health data. Improve nutrient density of diet through increasing intake of fruits and vegetables and decreasing saturated fats, white flour products and refined sugars.  4) Healthcare maintenance: -Continue current medication regimen. -Follow heart healthy diet. -Stay well-hydrated, at least 64 fl ounces. -Suprapubic tenderness present on PE, asymptomatic, and UA WNL. If symptoms develop recommend to RTC for further evaluation. -Follow-up in 3 months for regular office visit: Hypertension, mood  No orders of the  defined types were placed in this encounter.   Orders Placed This Encounter  Procedures  . MM Digital Screening  . CBC  . Comprehensive metabolic panel  . Hemoglobin A1c  . TSH  . Lipid panel  . Ambulatory referral to Obstetrics / Gynecology  . POCT urinalysis dipstick     Return in about 3 months (around 08/30/2020) for HTN, Mood.    Gross side effects, risk and benefits, and alternatives of medications discussed with patient.  Patient is aware that all medications have potential side effects and we are unable to predict every side effect or drug-drug interaction that may occur.  Expresses verbal understanding and consents to current therapy plan and treatment regimen.  F-up preventative CPE in 1 year- reminded pt again, this is in addition to any chronic care visits.    Please see orders placed and AVS handed out to patient at the end of our visit for further patient instructions/ counseling done pertaining to today's office visit.    Subjective:     CPE HPI: [name] Barnett is a 44 y.o. female who presents to [facility] today for a yearly health maintenance exam.   Health Maintenance Summary  - Reviewed and updated, unless pt declines services.  Last Cologuard or Colonoscopy: N/A Family history of Colon CA: No CT scan for screening lung CA: N/A Alcohol and/or drug use: No concerns; no use Dental Home: N Eye exams: Y Dermatology home: N Female Health:  PAP Smear - last known results:  Placed Ob-Gyn referral. STD concerns: none Lumps  or breast concerns: none Breast Cancer Family History: No    Immunization History  Administered Date(s) Administered  . Tdap 11/27/2018     Health Maintenance  Topic Date Due  . COVID-19 Vaccine (1) Never done  . PAP SMEAR-Modifier  Never done  . INFLUENZA VACCINE  07/10/2020  . TETANUS/TDAP  11/27/2028  . Hepatitis C Screening  Completed  . HIV Screening  Completed     Wt Readings from Last  3 Encounters:  05/30/20 136 lb 4.8 oz (61.8 kg)  11/23/19 140 lb 9.6 oz (63.8 kg)  04/28/19 145 lb (65.8 kg)   BP Readings from Last 3 Encounters:  05/30/20 122/79  01/08/19 126/77  01/06/19 135/84   Pulse Readings from Last 3 Encounters:  05/30/20 95  01/06/19 77  11/27/18 80     Past Medical History:  Diagnosis Date  . Duodenal obstruction   . GERD (gastroesophageal reflux disease)   . Hypertension   . Stroke [facility])    april 2018, lt sided weakness  . Trimalleolar fracture of ankle, closed, right, initial encounter       Past Surgical History:  Procedure Laterality Date  . BALLOON DILATION  12/31/2011   Procedure: BALLOON DILATION;  Surgeon: Barrie Folk, MD;  Location: [facility] ENDOSCOPY;  Service: Endoscopy;  Laterality: N/A;  . CHOLECYSTECTOMY  07/28/11  . ESOPHAGOGASTRODUODENOSCOPY  10/17/2011   Procedure: ESOPHAGOGASTRODUODENOSCOPY (EGD);  Surgeon: Barrie Folk, MD;  Location: [facility] ENDOSCOPY;  Service: Endoscopy;  Laterality: N/A;  . IR ANGIO INTRA EXTRACRAN SEL INTERNAL CAROTID BILAT MOD SED  04/07/2017  . IR ANGIO INTRA EXTRACRAN SEL INTERNAL CAROTID BILAT MOD SED  01/07/2018  . IR ANGIO VERTEBRAL SEL VERTEBRAL UNI R MOD SED  04/07/2017  . IR ANGIO VERTEBRAL SEL VERTEBRAL UNI R MOD SED  01/07/2018  . IR ANGIOGRAM FOLLOW UP STUDY  04/07/2017  . IR ANGIOGRAM FOLLOW UP STUDY  04/07/2017  . IR ANGIOGRAM FOLLOW UP STUDY  04/07/2017  . IR ANGIOGRAM FOLLOW UP STUDY  04/07/2017  . IR ANGIOGRAM FOLLOW UP STUDY  04/07/2017  . IR ANGIOGRAM SELECTIVE EACH ADDITIONAL VESSEL  04/07/2017  . IR TRANSCATH/EMBOLIZ  04/07/2017  . IR US GUIDE VASC ACCESS RIGHT  01/07/2018  . ORIF ANKLE FRACTURE Right 07/01/2018   Procedure: RIGHT ANKLE FRACTURE OPEN TREATMENT TRIMALLEOLAR INCLUDES INTERNAL FIXATION WITHOUT FIXATION OF POSTERIOR LIP, FRACTURE CLOSED TREATMENT TRIMALLEOLAR ANKLE WITH MANIPULATION, REPAIR SYNDESMOSIS, REPAIR DELTOID LIGAMENT;  Surgeon: Sheral Apley, MD;  Location: [facility];  Service: Orthopedics;  Laterality: Right;  . PARS PLANA VITRECTOMY Right 05/01/2017   Procedure: PARS PLANA VITRECTOMY WITH 25 GAUGE RIGHT EYE, endolaser photocoaglation;  Surgeon: Carmela Rima, MD;  Location: [facility] OR;  Service: Ophthalmology;  Laterality: Right;  . RADIOLOGY WITH ANESTHESIA N/A 04/07/2017   Procedure: RADIOLOGY WITH ANESTHESIA;  Surgeon: Lisbeth Renshaw, MD;  Location: [facility] OR;  Service: Radiology;  Laterality: N/A;  . REPLACEMENT TOTAL KNEE BILATERAL  2004  . TEMPOROMANDIBULAR JOINT SURGERY     2 surgeries  . TUMOR REMOVAL    . VAGOTOMY  01/23/2012   Procedure: VAGOTOMY, antrectomy and BII;  Surgeon: Currie Paris, MD;  Location: [facility] OR;  Service: General;  Laterality: N/A;  Laparotomy with vagotomy.      Family History  Problem Relation Age of Onset  . Hypertension Mother   . Restless legs syndrome Mother   . Hyperlipidemia Mother   . Heart disease Father   . Malignant hyperthermia Neg Hx  Social History   Substance and Sexual Activity  Drug Use No  ,   Social History   Substance and Sexual Activity  Alcohol Use Yes   Comment: social  ,   Social History   Tobacco Use  Smoking Status Current Every Day Smoker  . Packs/day: 1.00  . Years: 20.00  . Pack years: 20.00  . Types: Cigarettes  . Last attempt to quit: 06/24/2018  . Years since quitting: 1.9  Smokeless Tobacco Never Used  ,   Social History   Substance and Sexual Activity  Sexual Activity Not Currently  . Partners: Male  . Birth control/protection: None    Current Outpatient Medications on File Prior to Visit  Medication Sig Dispense Refill  . amLODipine (NORVASC) 5 MG tablet Take 1 tablet (5 mg total) by mouth daily. 90 tablet 1  . atorvastatin (LIPITOR) 20 MG tablet TAKE 1 TABLET BY MOUTH EVERYDAY AT BEDTIME 90 tablet 1  . FLUoxetine (PROZAC) 20 MG capsule Take 3 capsules (60 mg total) by mouth daily. 270 capsule 1  . gabapentin (NEURONTIN) 600 MG tablet TAKE 1  TABLET BY MOUTH FOUR TIMES A DAY 360 tablet 0  . Multiple Vitamin (MULTIVITAMIN WITH MINERALS) TABS tablet Take 1 tablet by mouth daily.    . Vitamin D, Ergocalciferol, (DRISDOL) 1.25 MG (50000 UT) CAPS capsule Take 1 capsule (50,000 Units total) by mouth every 7 (seven) days. 12 capsule 3   No current facility-administered medications on file prior to visit.    Allergies: Nsaids  Review of Systems: General:   Denies fever, chills, unexplained weight loss.  Optho/Auditory:   Denies visual changes, blurred vision/LOV Respiratory:   Denies SOB, DOE more than baseline levels.   Cardiovascular:   Denies chest pain, palpitations, new onset peripheral edema  Gastrointestinal:   Denies nausea, vomiting, diarrhea.  Genitourinary: Denies dysuria, freq/ urgency, flank pain  Endocrine:     Denies hot or cold intolerance, polyuria, polydipsia. Musculoskeletal:   Denies unexplained myalgias, joint swelling, unexplained arthralgias, gait problems.  Skin:  Denies rash, suspicious lesions Neurological:     Denies dizziness, unexplained weakness, numbness  Psychiatric/Behavioral:   Denies mood changes, suicidal or homicidal ideations, hallucinations    Objective:    Blood pressure 122/79, pulse 95, temperature 98.3 F (36.8 C), temperature source Oral, height 5\' 3"  (1.6 m), weight 136 lb 4.8 oz (61.8 kg), SpO2 94 %. Body mass index is 24.14 kg/m. General Appearance:    Alert, cooperative, no distress, appears stated age  Head:    Normocephalic, without obvious abnormality, atraumatic  Eyes:    PERRL, conjunctiva/corneas clear, L eye ptosis   Ears:    Normal TM's and external ear canals, R ear. Middle ear effusion of L ear  Nose:   Nares normal, septum midline, mucosa normal, no drainage    or sinus tenderness  Throat:   Lips w/o lesion, mucosa moist, and tongue normal; teeth and   gums normal  Neck:   Supple, symmetrical, trachea midline, no adenopathy;    thyroid:  no  enlargement/tenderness/nodules; no carotid   bruit or JVD  Back:     Symmetric, no curvature, ROM normal, no CVA tenderness  Lungs:     Clear to auscultation bilaterally, respirations unlabored, no       Wh/ R/ R  Chest Wall:    No tenderness or gross deformity; normal excursion   Heart:    Regular rate and rhythm, S1 and S2 normal, no murmur, rub  or gallop  Breast Exam:    Deferred to Ob-Gyn.  Abdomen:     Soft, tenderness to palpation of suprapubic area, NBS, No   G/R/R, no masses, no organomegaly  Genitalia:   Deferred to Ob-Gyn.  Rectal:   Deferred to Ob-Gyn.  Extremities:   Extremities normal, atraumatic, no cyanosis or gross edema  Pulses:   Good pulses  Skin:   Warm, dry, Skin color, texture, turgor normal, no obvious rashes or lesions Psych: No HI/SI, judgement and insight good, Euthymic mood. Full Affect.  Neurologic:  CNII-XII grossly intact, ataxia post stroke, decreased sensation and reflexes of right side, left patellar reflex 3+

## 2020-05-31 LAB — CBC
Hematocrit: 50.5 % — ABNORMAL HIGH (ref 34.0–46.6)
Hemoglobin: 16.7 g/dL — ABNORMAL HIGH (ref 11.1–15.9)
MCH: 32.4 pg (ref 26.6–33.0)
MCHC: 33.1 g/dL (ref 31.5–35.7)
MCV: 98 fL — ABNORMAL HIGH (ref 79–97)
Platelets: 343 10*3/uL (ref 150–450)
RBC: 5.16 x10E6/uL (ref 3.77–5.28)
RDW: 12.6 % (ref 11.7–15.4)
WBC: 10.9 10*3/uL — ABNORMAL HIGH (ref 3.4–10.8)

## 2020-05-31 LAB — LIPID PANEL
Chol/HDL Ratio: 3 ratio (ref 0.0–4.4)
Cholesterol, Total: 189 mg/dL (ref 100–199)
HDL: 64 mg/dL (ref >39)
LDL Chol Calc (NIH): 98 mg/dL (ref 0–99)
Triglycerides: 157 mg/dL — ABNORMAL HIGH (ref 0–149)
VLDL Cholesterol Cal: 27 mg/dL (ref 5–40)

## 2020-05-31 LAB — HEMOGLOBIN A1C
Est. average glucose Bld gHb Est-mCnc: 100 mg/dL
Hgb A1c MFr Bld: 5.1 % (ref 4.8–5.6)

## 2020-05-31 LAB — COMPREHENSIVE METABOLIC PANEL
ALT: 14 IU/L (ref 0–32)
AST: 24 IU/L (ref 0–40)
Albumin/Globulin Ratio: 1.5 (ref 1.2–2.2)
Albumin: 3.9 g/dL (ref 3.8–4.8)
Alkaline Phosphatase: 121 IU/L (ref 48–121)
BUN/Creatinine Ratio: 10 (ref 9–23)
BUN: 10 mg/dL (ref 6–24)
Bilirubin Total: <0.2 mg/dL (ref 0.0–1.2)
CO2: 21 mmol/L (ref 20–29)
Calcium: 9.5 mg/dL (ref 8.7–10.2)
Chloride: 102 mmol/L (ref 96–106)
Creatinine, Ser: 0.99 mg/dL (ref 0.57–1.00)
GFR calc Af Amer: 80 mL/min/{1.73_m2} (ref >59)
GFR calc non Af Amer: 70 mL/min/{1.73_m2} (ref >59)
Globulin, Total: 2.6 g/dL (ref 1.5–4.5)
Glucose: 93 mg/dL (ref 65–99)
Potassium: 4.3 mmol/L (ref 3.5–5.2)
Sodium: 140 mmol/L (ref 134–144)
Total Protein: 6.5 g/dL (ref 6.0–8.5)

## 2020-05-31 LAB — TSH: TSH: 0.464 u[IU]/mL (ref 0.450–4.500)

## 2020-06-01 ENCOUNTER — Telehealth: Payer: Self-pay | Admitting: Physician Assistant

## 2020-06-01 DIAGNOSIS — R7989 Other specified abnormal findings of blood chemistry: Secondary | ICD-10-CM

## 2020-06-01 NOTE — Telephone Encounter (Signed)
-----   Message from Mayer Masker, [place] sent at 05/31/2020  5:14 PM EDT ----- [name] Barnett,  Please call [name] Barnett and notify most labs recently obtained were within normal limits or stable. Cbc reveals increased hemoglobin and hematocrit and were normal prior, otherwise stable. I recommend rechecking cbc in 4 weeks (lab visit only). Advise to stay well hydrated. Lipid panel stable with exception of elevated triglycerides. Recommend to follow heart healthy diet. Will continue to monitor.   Thank you, Kandis Cocking

## 2020-06-26 ENCOUNTER — Other Ambulatory Visit: Payer: Self-pay | Admitting: Family Medicine

## 2020-06-26 DIAGNOSIS — I725 Aneurysm of other precerebral arteries: Secondary | ICD-10-CM

## 2020-06-26 DIAGNOSIS — E785 Hyperlipidemia, unspecified: Secondary | ICD-10-CM

## 2020-06-26 DIAGNOSIS — I609 Nontraumatic subarachnoid hemorrhage, unspecified: Secondary | ICD-10-CM

## 2020-06-30 ENCOUNTER — Other Ambulatory Visit: Payer: Self-pay

## 2020-06-30 ENCOUNTER — Other Ambulatory Visit: Payer: BC Managed Care – PPO

## 2020-06-30 DIAGNOSIS — R7989 Other specified abnormal findings of blood chemistry: Secondary | ICD-10-CM

## 2020-07-01 LAB — CBC
Hematocrit: 47.9 % — ABNORMAL HIGH (ref 34.0–46.6)
Hemoglobin: 16.2 g/dL — ABNORMAL HIGH (ref 11.1–15.9)
MCH: 32.2 pg (ref 26.6–33.0)
MCHC: 33.8 g/dL (ref 31.5–35.7)
MCV: 95 fL (ref 79–97)
Platelets: 338 10*3/uL (ref 150–450)
RBC: 5.03 x10E6/uL (ref 3.77–5.28)
RDW: 12.9 % (ref 11.7–15.4)
WBC: 12 10*3/uL — ABNORMAL HIGH (ref 3.4–10.8)

## 2020-07-18 ENCOUNTER — Other Ambulatory Visit: Payer: Self-pay | Admitting: Family Medicine

## 2020-07-18 DIAGNOSIS — I10 Essential (primary) hypertension: Secondary | ICD-10-CM

## 2020-07-18 DIAGNOSIS — I725 Aneurysm of other precerebral arteries: Secondary | ICD-10-CM

## 2020-07-18 DIAGNOSIS — I609 Nontraumatic subarachnoid hemorrhage, unspecified: Secondary | ICD-10-CM

## 2020-07-19 ENCOUNTER — Telehealth: Payer: Self-pay | Admitting: Physician Assistant

## 2020-07-19 DIAGNOSIS — I609 Nontraumatic subarachnoid hemorrhage, unspecified: Secondary | ICD-10-CM

## 2020-07-19 DIAGNOSIS — E785 Hyperlipidemia, unspecified: Secondary | ICD-10-CM

## 2020-07-19 DIAGNOSIS — I10 Essential (primary) hypertension: Secondary | ICD-10-CM

## 2020-07-19 DIAGNOSIS — I725 Aneurysm of other precerebral arteries: Secondary | ICD-10-CM

## 2020-07-19 MED ORDER — AMLODIPINE BESYLATE 5 MG PO TABS: 5.0000 mg | ORAL_TABLET | Freq: Every day | ORAL | 0 refills | Status: DC

## 2020-07-19 MED ORDER — ATORVASTATIN CALCIUM 20 MG PO TABS: ORAL_TABLET | ORAL | 0 refills | Status: DC

## 2020-07-19 NOTE — Addendum Note (Signed)
Addended by: Stan Head on: 07/19/2020 11:51 AM   Modules accepted: Orders

## 2020-07-19 NOTE — Telephone Encounter (Signed)
Patient is requesting a refill of her amlodipine and atorvastatin. If approved please send to [facility] on [address]

## 2020-08-09 ENCOUNTER — Other Ambulatory Visit: Payer: Self-pay | Admitting: Family Medicine

## 2020-08-09 DIAGNOSIS — F4323 Adjustment disorder with mixed anxiety and depressed mood: Secondary | ICD-10-CM

## 2020-08-09 DIAGNOSIS — F411 Generalized anxiety disorder: Secondary | ICD-10-CM

## 2020-08-16 ENCOUNTER — Telehealth: Payer: Self-pay | Admitting: Physician Assistant

## 2020-08-16 NOTE — Telephone Encounter (Signed)
Patient is requesting a refill of her Buspar, I advised her that it was d/c'd in chart but she says that was inaccurate and is hoping we can fix that in her chart and send her in a refill. Please advise

## 2020-08-17 MED ORDER — BUSPIRONE HCL 5 MG PO TABS: 5.0000 mg | ORAL_TABLET | Freq: Three times a day (TID) | ORAL | 0 refills | Status: DC

## 2020-08-17 NOTE — Telephone Encounter (Signed)
This medication was not d/c'd from chart-it just expired-meaning she was out of the original script from [facility] and needed a new script sent to pharmacy. Patient called office in May requesting refill and was advised then refill requested too soon-see note.   Refill sent to pharmacy. Left message to advise patient. AS, CMA

## 2020-08-17 NOTE — Addendum Note (Signed)
Addended by: Sylvester Harder on: 08/17/2020 08:27 AM   Modules accepted: Orders

## 2020-08-30 ENCOUNTER — Ambulatory Visit: Payer: BC Managed Care – PPO | Admitting: Physician Assistant

## 2020-08-30 ENCOUNTER — Other Ambulatory Visit: Payer: Self-pay

## 2020-09-12 ENCOUNTER — Other Ambulatory Visit: Payer: Self-pay | Admitting: Family Medicine

## 2020-10-10 ENCOUNTER — Other Ambulatory Visit: Payer: Self-pay | Admitting: Physician Assistant

## 2020-10-10 DIAGNOSIS — I609 Nontraumatic subarachnoid hemorrhage, unspecified: Secondary | ICD-10-CM

## 2020-10-10 DIAGNOSIS — I10 Essential (primary) hypertension: Secondary | ICD-10-CM

## 2020-10-10 DIAGNOSIS — E785 Hyperlipidemia, unspecified: Secondary | ICD-10-CM

## 2020-10-10 DIAGNOSIS — I725 Aneurysm of other precerebral arteries: Secondary | ICD-10-CM

## 2020-10-12 ENCOUNTER — Telehealth: Payer: Self-pay

## 2020-10-12 ENCOUNTER — Other Ambulatory Visit: Payer: Self-pay | Admitting: Family Medicine

## 2020-10-12 DIAGNOSIS — E559 Vitamin D deficiency, unspecified: Secondary | ICD-10-CM

## 2020-10-12 NOTE — Telephone Encounter (Signed)
Patient husband called requesting a refill for patient on Gabapentin. Last seen 01/06/2019

## 2020-10-17 ENCOUNTER — Telehealth: Payer: Self-pay | Admitting: Physician Assistant

## 2020-10-17 DIAGNOSIS — E559 Vitamin D deficiency, unspecified: Secondary | ICD-10-CM

## 2020-10-17 MED ORDER — VITAMIN D (ERGOCALCIFEROL) 1.25 MG (50000 UNIT) PO CAPS: 50000.0000 [IU] | ORAL_CAPSULE | ORAL | 3 refills | Status: DC

## 2020-10-17 NOTE — Telephone Encounter (Signed)
Refill sent to requested pharmacy. AS, CMA 

## 2020-10-17 NOTE — Addendum Note (Signed)
Addended by: Sylvester Harder on: 10/17/2020 04:22 PM   Modules accepted: Orders

## 2020-10-17 NOTE — Telephone Encounter (Signed)
Patient needs a refill on Vitamin D, (Drisdol). [facility] [address] Thank you.

## 2020-10-25 ENCOUNTER — Other Ambulatory Visit: Payer: Self-pay | Admitting: Physician Assistant

## 2020-10-25 MED ORDER — GABAPENTIN 600 MG PO TABS: 600.0000 mg | ORAL_TABLET | Freq: Four times a day (QID) | ORAL | 0 refills | Status: DC

## 2020-11-08 ENCOUNTER — Other Ambulatory Visit: Payer: Self-pay | Admitting: Physician Assistant

## 2020-12-29 ENCOUNTER — Encounter: Payer: Self-pay | Admitting: Physical Therapy

## 2020-12-29 NOTE — Therapy (Signed)
[facility] [address] [place], [place], [address] Phone: [phone]   Fax:  [phone]  Patient Details  Name: [name] Barnett MRN: [id number] Date of Birth: 03/09/1976 Referring Provider:  No ref. provider found  Encounter Date: 12/29/2020  PHYSICAL THERAPY DISCHARGE SUMMARY  Visits from Start of Care: 36  Current functional level related to goals / functional outcomes: Pt was making good progress towards goals but due to clinic closure during COVID-19 pandemic patient was unable to complete episode of care.  When clinic re-opened pt did not wish to return due to risk of exposure.     Remaining deficits: Impaired coordination, impaired balance, impaired gait   Education / Equipment: HEP  Plan: Patient agrees to discharge.  Patient goals were not met. Patient is being discharged due to the patient's request.  ?????     Rico Junker, PT, DPT 12/29/20    12:46 PM    [facility] [address] [place], [place], [address] Phone: [phone]   Fax:  [phone]

## 2021-01-01 ENCOUNTER — Other Ambulatory Visit: Payer: Self-pay | Admitting: Physician Assistant

## 2021-01-01 DIAGNOSIS — I10 Essential (primary) hypertension: Secondary | ICD-10-CM

## 2021-01-01 DIAGNOSIS — E785 Hyperlipidemia, unspecified: Secondary | ICD-10-CM

## 2021-01-01 DIAGNOSIS — I725 Aneurysm of other precerebral arteries: Secondary | ICD-10-CM

## 2021-01-01 DIAGNOSIS — I609 Nontraumatic subarachnoid hemorrhage, unspecified: Secondary | ICD-10-CM

## 2021-01-02 ENCOUNTER — Telehealth: Payer: Self-pay | Admitting: Physician Assistant

## 2021-01-02 NOTE — Telephone Encounter (Signed)
Left vm on 01-02-21 

## 2021-01-02 NOTE — Telephone Encounter (Signed)
Please contact patient to schedule OV per last AVS for further med refills. AS, CMA 

## 2021-01-10 ENCOUNTER — Encounter: Payer: Self-pay | Admitting: Occupational Therapy

## 2021-01-10 NOTE — Therapy (Unsigned)
[facility] [address] [place], [place], [address] Phone: [phone]   Fax:  [phone]  January 10, 2021    No Recipients  Occupational Therapy Discharge Summary   Patient: [name] Barnett MRN: [id number] Date of Birth: [birth year]  Diagnosis: No diagnosis found.  No data recorded Patient seen for 54 visits  The treatment consisted of OT Aquatic therapy, and prior OT -clinic based The patient is: did not return   Patient was seeing OT for aquatic therapy prior to pandemic.   Patient has not returned since pandemic due to concern of risk of exposure.      Sincerely,  Joey Lierman, Lanice Shirts, OT  CC No Recipients  [facility] [address] [place], [place], [address] Phone: [phone]   Fax:  [phone]  Patient: [name] Barnett MRN: [id number] Date of Birth: 1976/12/10

## 2021-01-28 ENCOUNTER — Other Ambulatory Visit: Payer: Self-pay | Admitting: Physician Assistant

## 2021-01-28 DIAGNOSIS — I725 Aneurysm of other precerebral arteries: Secondary | ICD-10-CM

## 2021-01-28 DIAGNOSIS — E785 Hyperlipidemia, unspecified: Secondary | ICD-10-CM

## 2021-01-28 DIAGNOSIS — I609 Nontraumatic subarachnoid hemorrhage, unspecified: Secondary | ICD-10-CM

## 2021-01-28 DIAGNOSIS — I10 Essential (primary) hypertension: Secondary | ICD-10-CM

## 2021-02-03 ENCOUNTER — Other Ambulatory Visit: Payer: Self-pay | Admitting: Physician Assistant

## 2021-02-23 ENCOUNTER — Other Ambulatory Visit: Payer: Self-pay | Admitting: Physician Assistant

## 2021-02-23 DIAGNOSIS — I10 Essential (primary) hypertension: Secondary | ICD-10-CM

## 2021-02-23 DIAGNOSIS — I609 Nontraumatic subarachnoid hemorrhage, unspecified: Secondary | ICD-10-CM

## 2021-02-23 DIAGNOSIS — E785 Hyperlipidemia, unspecified: Secondary | ICD-10-CM

## 2021-02-23 DIAGNOSIS — I725 Aneurysm of other precerebral arteries: Secondary | ICD-10-CM

## 2021-02-26 ENCOUNTER — Other Ambulatory Visit: Payer: Self-pay | Admitting: Physician Assistant

## 2021-02-26 DIAGNOSIS — I10 Essential (primary) hypertension: Secondary | ICD-10-CM

## 2021-02-26 DIAGNOSIS — I725 Aneurysm of other precerebral arteries: Secondary | ICD-10-CM

## 2021-02-26 DIAGNOSIS — E785 Hyperlipidemia, unspecified: Secondary | ICD-10-CM

## 2021-02-26 DIAGNOSIS — I609 Nontraumatic subarachnoid hemorrhage, unspecified: Secondary | ICD-10-CM

## 2021-03-08 ENCOUNTER — Encounter: Payer: Self-pay | Admitting: Physician Assistant

## 2021-03-08 ENCOUNTER — Ambulatory Visit (INDEPENDENT_AMBULATORY_CARE_PROVIDER_SITE_OTHER): Payer: BC Managed Care – PPO | Admitting: Physician Assistant

## 2021-03-08 ENCOUNTER — Other Ambulatory Visit: Payer: Self-pay

## 2021-03-08 VITALS — BP 123/82 | HR 80 | Temp 97.1°F | Ht 63.0 in | Wt 105.3 lb

## 2021-03-08 DIAGNOSIS — F4323 Adjustment disorder with mixed anxiety and depressed mood: Secondary | ICD-10-CM | POA: Diagnosis not present

## 2021-03-08 DIAGNOSIS — E785 Hyperlipidemia, unspecified: Secondary | ICD-10-CM | POA: Diagnosis not present

## 2021-03-08 DIAGNOSIS — I10 Essential (primary) hypertension: Secondary | ICD-10-CM | POA: Diagnosis not present

## 2021-03-08 MED ORDER — MIRTAZAPINE 7.5 MG PO TABS
7.5000 mg | ORAL_TABLET | Freq: Every day | ORAL | 0 refills | Status: DC
Start: 2021-03-08 — End: 2021-04-18

## 2021-03-08 MED ORDER — AMLODIPINE BESYLATE 5 MG PO TABS: 5.0000 mg | ORAL_TABLET | Freq: Every day | ORAL | 1 refills | Status: DC

## 2021-03-08 MED ORDER — ATORVASTATIN CALCIUM 20 MG PO TABS: ORAL_TABLET | ORAL | 1 refills | Status: DC

## 2021-03-08 NOTE — Progress Notes (Signed)
Established Patient Office Visit  Subjective:  Patient ID: [name] Barnett, female    DOB: Apr 09, 1976  Age: 45 y.o. MRN: [id number]  CC:  Chief Complaint  Patient presents with  . Follow-up  . Hypertension    HPI [employer] presents for follow up on hypertension and hyperlipidemia. Patient is accompanied by her husband. Patient reports continues to have right hip and leg pain which has gradually worsened, related to stroke. Reports has seen all specialists including orthopedics and neurosurgery.  HTN: Pt denies chest pain, palpitations, dizziness or lower extremity swelling. Taking medication as directed without side effects. Does not check blood pressure at home. Pt reports good hydration.  Mood: Patient reports taking fluoxetine 20 mg twice daily. Has reduced Buspar to 5 mg once daily before bedtime because it makes her very tired. Does not feel fluoxetine is helping with mood. Continues to feel down and has poor appetite.   HLD: Pt taking medication as directed without issues. Requesting 90 day supply which is more cost affordable.    Past Medical History:  Diagnosis Date  . Duodenal obstruction   . GERD (gastroesophageal reflux disease)   . Hypertension   . Stroke [facility])    april 2018, lt sided weakness  . Trimalleolar fracture of ankle, closed, right, initial encounter     Past Surgical History:  Procedure Laterality Date  . BALLOON DILATION  12/31/2011   Procedure: BALLOON DILATION;  Surgeon: Barrie Folk, MD;  Location: [facility] ENDOSCOPY;  Service: Endoscopy;  Laterality: N/A;  . CHOLECYSTECTOMY  07/28/11  . ESOPHAGOGASTRODUODENOSCOPY  10/17/2011   Procedure: ESOPHAGOGASTRODUODENOSCOPY (EGD);  Surgeon: Barrie Folk, MD;  Location: [facility] ENDOSCOPY;  Service: Endoscopy;  Laterality: N/A;  . IR ANGIO INTRA EXTRACRAN SEL INTERNAL CAROTID BILAT MOD SED  04/07/2017  . IR ANGIO INTRA EXTRACRAN SEL INTERNAL CAROTID BILAT MOD SED  01/07/2018  . IR ANGIO VERTEBRAL SEL VERTEBRAL UNI R MOD  SED  04/07/2017  . IR ANGIO VERTEBRAL SEL VERTEBRAL UNI R MOD SED  01/07/2018  . IR ANGIOGRAM FOLLOW UP STUDY  04/07/2017  . IR ANGIOGRAM FOLLOW UP STUDY  04/07/2017  . IR ANGIOGRAM FOLLOW UP STUDY  04/07/2017  . IR ANGIOGRAM FOLLOW UP STUDY  04/07/2017  . IR ANGIOGRAM FOLLOW UP STUDY  04/07/2017  . IR ANGIOGRAM SELECTIVE EACH ADDITIONAL VESSEL  04/07/2017  . IR TRANSCATH/EMBOLIZ  04/07/2017  . IR US GUIDE VASC ACCESS RIGHT  01/07/2018  . ORIF ANKLE FRACTURE Right 07/01/2018   Procedure: RIGHT ANKLE FRACTURE OPEN TREATMENT TRIMALLEOLAR INCLUDES INTERNAL FIXATION WITHOUT FIXATION OF POSTERIOR LIP, FRACTURE CLOSED TREATMENT TRIMALLEOLAR ANKLE WITH MANIPULATION, REPAIR SYNDESMOSIS, REPAIR DELTOID LIGAMENT;  Surgeon: Sheral Apley, MD;  Location: [facility];  Service: Orthopedics;  Laterality: Right;  . PARS PLANA VITRECTOMY Right 05/01/2017   Procedure: PARS PLANA VITRECTOMY WITH 25 GAUGE RIGHT EYE, endolaser photocoaglation;  Surgeon: Carmela Rima, MD;  Location: [facility] OR;  Service: Ophthalmology;  Laterality: Right;  . RADIOLOGY WITH ANESTHESIA N/A 04/07/2017   Procedure: RADIOLOGY WITH ANESTHESIA;  Surgeon: Lisbeth Renshaw, MD;  Location: [facility] OR;  Service: Radiology;  Laterality: N/A;  . REPLACEMENT TOTAL KNEE BILATERAL  2004  . TEMPOROMANDIBULAR JOINT SURGERY     2 surgeries  . TUMOR REMOVAL    . VAGOTOMY  01/23/2012   Procedure: VAGOTOMY, antrectomy and BII;  Surgeon: Currie Paris, MD;  Location: [facility] OR;  Service: General;  Laterality: N/A;  Laparotomy with vagotomy.    Family History  Problem Relation Age of Onset  . Hypertension Mother   . Restless legs syndrome Mother   . Hyperlipidemia Mother   . Heart disease Father   . Malignant hyperthermia Neg Hx     Social History   Socioeconomic History  . Marital status: Married    Spouse name: Not on file  . Number of children: Not on file  . Years of education: Not on file  . Highest education level: Not on file   Occupational History  . Not on file  Tobacco Use  . Smoking status: Current Every Day Smoker    Packs/day: 1.00    Years: 20.00    Pack years: 20.00    Types: Cigarettes    Last attempt to quit: 06/24/2018    Years since quitting: 2.7  . Smokeless tobacco: Never Used  Vaping Use  . Vaping Use: Never used  Substance and Sexual Activity  . Alcohol use: Yes    Comment: social  . Drug use: No  . Sexual activity: Not Currently    Partners: Male    Birth control/protection: None  Other Topics Concern  . Not on file  Social History Narrative   Lives in [place] with husband and 2 kids. Works as a Psychologist, prison and probation services.   Social Determinants of Health   Financial Resource Strain: Not on file  Food Insecurity: Not on file  Transportation Needs: Not on file  Physical Activity: Not on file  Stress: Not on file  Social Connections: Not on file  Intimate Partner Violence: Not on file    Outpatient Medications Prior to Visit  Medication Sig Dispense Refill  . Multiple Vitamin (MULTIVITAMIN WITH MINERALS) TABS tablet Take 1 tablet by mouth daily.    . Vitamin D, Ergocalciferol, (DRISDOL) 1.25 MG (50000 UNIT) CAPS capsule Take 1 capsule (50,000 Units total) by mouth every 7 (seven) days. 12 capsule 3  . amLODipine (NORVASC) 5 MG tablet TAKE 1 TABLET (5 MG TOTAL) BY MOUTH DAILY. **NEEDS APT FOR REFILLS** 15 tablet 0  . atorvastatin (LIPITOR) 20 MG tablet **NEEDS APT FOR REFILLS**TAKE 1 TABLET BY MOUTH EVERYDAY AT BEDTIME 15 tablet 0  . busPIRone (BUSPAR) 5 MG tablet TAKE 1 TABLET BY MOUTH THREE TIMES A DAY 270 tablet 0  . FLUoxetine (PROZAC) 20 MG capsule Take 3 capsules (60 mg total) by mouth daily. 270 capsule 1  . gabapentin (NEURONTIN) 600 MG tablet Take 1 tablet (600 mg total) by mouth 4 (four) times daily. 360 tablet 0   No facility-administered medications prior to visit.    Allergies  Allergen Reactions  . 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    Objective:    Physical Exam General: Pleasant and cooperative, thin appearing, in no acute distress   Neuro:  Alert and oriented,  extra-ocular muscles intact  HEENT:  Normocephalic, atraumatic, neck supple Skin:  no gross rash, warm, pink. Cardiac:  RRR, S1 S2 Respiratory:  ECTA B/L, Not using accessory muscles, speaking in full sentences- unlabored. Vascular:  Ext warm, no cyanosis apprec.; cap RF less 2 sec. No edema  Psych:  No HI/SI, judgement and insight good, Euthymic mood. Full Affect.   BP 123/82   Pulse 80   Temp (!) 97.1 F (36.2 C)   Ht 5\' 3"  (1.6 m)   Wt  105 lb 4.8 oz (47.8 kg)   SpO2 98%   BMI 18.65 kg/m  Wt Readings from Last 3 Encounters:  03/08/21 105 lb 4.8 oz (47.8 kg)  05/30/20 136 lb 4.8 oz (61.8 kg)  11/23/19 140 lb 9.6 oz (63.8 kg)     Health Maintenance Due  Topic Date Due  . COVID-19 Vaccine (1) Never done  . PAP SMEAR-Modifier  Never done  . COLONOSCOPY (Pts 45-2543yrs Insurance coverage will need to be confirmed)  Never done  . INFLUENZA VACCINE  Never done    There are no preventive care reminders to display for this patient.  Lab Results  Component Value Date   TSH 0.464 05/30/2020   Lab Results  Component Value Date   WBC 12.0 (H) 06/30/2020   HGB 16.2 (H) 06/30/2020   HCT 47.9 (H) 06/30/2020   MCV 95 06/30/2020   PLT 338 06/30/2020   Lab Results  Component Value Date   NA 140 05/30/2020   K 4.3 05/30/2020   CO2 21 05/30/2020   GLUCOSE 93 05/30/2020   BUN 10 05/30/2020   CREATININE 0.99 05/30/2020   BILITOT <0.2 05/30/2020   ALKPHOS 121 05/30/2020   AST 24 05/30/2020   ALT 14 05/30/2020   PROT 6.5 05/30/2020   ALBUMIN 3.9 05/30/2020   CALCIUM 9.5 05/30/2020   ANIONGAP 11 01/07/2018   Lab Results  Component Value Date   CHOL 189 05/30/2020   Lab Results   Component Value Date   HDL 64 05/30/2020   Lab Results  Component Value Date   LDLCALC 98 05/30/2020   Lab Results  Component Value Date   TRIG 157 (H) 05/30/2020   Lab Results  Component Value Date   CHOLHDL 3.0 05/30/2020   Lab Results  Component Value Date   HGBA1C 5.1 05/30/2020      Assessment & Plan:   Problem List Items Addressed This Visit      Other   Adjustment disorder with mixed anxiety and depressed mood   Relevant Medications   mirtazapine (REMERON) 7.5 MG tablet    Other Visit Diagnoses    HTN, goal below 130/80    -  Primary   Relevant Medications   amLODipine (NORVASC) 5 MG tablet   atorvastatin (LIPITOR) 20 MG tablet   Hyperlipidemia, unspecified hyperlipidemia type       Relevant Medications   amLODipine (NORVASC) 5 MG tablet   atorvastatin (LIPITOR) 20 MG tablet     Hypertension, goal below 130/80: -Controlled. -Continue current medication regimen. Provided refill. Last CMP normal. Advised patient to schedule lab visit for FBW including repeating CMP before next visit in 8 weeks. -Continue good hydration and monitor sodium.  Hyperlipidemia, unspecified hyperlipidemia type: -Last lipid panel: total cholesterol 189, triglycerides 157, HDL 64, LDL 98 -Continue current medication regimen. Last hepatic function normal. Provided 90 day supply. -Will repeat lipid panel and hepatic function with FBW.  Adjustment disorder with mixed anxiety and depressed mood: -PHQ-9 score of 12, increased from prior (score of 9). -Patient has lost 31 pounds since last visit and discussed treatment adjustments by discontinuing buspirone and fluoxetine, and start mirtazapine 7.5 mg to help improve weight and mood. Patient verbalized understanding and is agreeable. Provided tapering instructions for fluoxetine.  -Follow up in 8 weeks to reassess symptoms and medication therapy.  Advised patient to follow up with Orthopedics for further evaluation of hip/leg pain with  additional imaging studies such as MRI. Patient verbalized understanding.   Meds ordered this  encounter  Medications  . amLODipine (NORVASC) 5 MG tablet    Sig: Take 1 tablet (5 mg total) by mouth daily.    Dispense:  90 tablet    Refill:  1    Order Specific Question:   Supervising Provider    Answer:   Nani Gasser D [[id number]]  . atorvastatin (LIPITOR) 20 MG tablet    Sig: Take 1 tablet by mouth at bedtime.    Dispense:  90 tablet    Refill:  1    Order Specific Question:   Supervising Provider    Answer:   Nani Gasser D [[id number]]  . mirtazapine (REMERON) 7.5 MG tablet    Sig: Take 1 tablet (7.5 mg total) by mouth at bedtime.    Dispense:  90 tablet    Refill:  0    Order Specific Question:   Supervising Provider    Answer:   Nani Gasser D [[id number]]    Follow-up: Return in about 8 weeks (around 05/03/2021) for Mood- changed med, Wt; lab visit for FBW few days prior.    Mayer Masker, PA-C

## 2021-03-08 NOTE — Patient Instructions (Signed)
Start taking Prozac 20 mg once daily until next week (02/16/21) and then start taking every other day x 1 week and then can stop Prozac and start Mirtazapine.

## 2021-03-24 ENCOUNTER — Other Ambulatory Visit: Payer: Self-pay | Admitting: Physician Assistant

## 2021-04-17 ENCOUNTER — Telehealth: Payer: Self-pay | Admitting: Physician Assistant

## 2021-04-17 DIAGNOSIS — F4323 Adjustment disorder with mixed anxiety and depressed mood: Secondary | ICD-10-CM

## 2021-04-17 NOTE — Telephone Encounter (Signed)
Patient states Mirtazapine is making you feel like zombie, and no energy. She is not feeling well, she did not take it all weekend. Please advise, thanks.

## 2021-04-18 ENCOUNTER — Telehealth: Payer: Self-pay | Admitting: Physician Assistant

## 2021-04-18 MED ORDER — PAROXETINE HCL 10 MG PO TABS: 10.0000 mg | ORAL_TABLET | Freq: Every evening | ORAL | 0 refills | Status: DC

## 2021-04-18 NOTE — Telephone Encounter (Signed)
Please inquire from patient how her appetite and weight are. Mirtazapine was started to help with appetite and improve weight, which are factors that will help me decide what alternatives we can try.  Thank you, Kandis Cocking

## 2021-04-18 NOTE — Addendum Note (Signed)
Addended by: Sylvester Harder on: 04/18/2021 01:19 PM   Modules accepted: Orders

## 2021-04-18 NOTE — Telephone Encounter (Signed)
Patient is returning your call.    Thanks

## 2021-04-18 NOTE — Telephone Encounter (Signed)
Left msg for patient to call back. AS, CMA 

## 2021-04-18 NOTE — Telephone Encounter (Signed)
She said she does not have much of an appetite and is weighing about 100 lbs.

## 2021-04-18 NOTE — Telephone Encounter (Signed)
Paxil 10mg  one nightly #30. Patient declines referral to psychiatry. Pt agreeable to paxil.

## 2021-04-21 ENCOUNTER — Other Ambulatory Visit: Payer: Self-pay | Admitting: Physician Assistant

## 2021-04-24 ENCOUNTER — Telehealth: Payer: Self-pay

## 2021-04-24 ENCOUNTER — Other Ambulatory Visit: Payer: Self-pay

## 2021-04-24 ENCOUNTER — Telehealth: Payer: Self-pay | Admitting: Physician Assistant

## 2021-04-24 DIAGNOSIS — M792 Neuralgia and neuritis, unspecified: Secondary | ICD-10-CM

## 2021-04-24 DIAGNOSIS — I69393 Ataxia following cerebral infarction: Secondary | ICD-10-CM

## 2021-04-24 DIAGNOSIS — I69319 Unspecified symptoms and signs involving cognitive functions following cerebral infarction: Secondary | ICD-10-CM

## 2021-04-24 DIAGNOSIS — I725 Aneurysm of other precerebral arteries: Secondary | ICD-10-CM

## 2021-04-24 MED ORDER — GABAPENTIN 600 MG PO TABS: 600.0000 mg | ORAL_TABLET | Freq: Three times a day (TID) | ORAL | 0 refills | Status: DC

## 2021-04-24 NOTE — Telephone Encounter (Signed)
Patient would like a refill on Gabapentin [facility] [address], thanks.

## 2021-04-24 NOTE — Telephone Encounter (Signed)
Patient would like a refill on Gabapentin [facility] [address], thanks.

## 2021-04-24 NOTE — Telephone Encounter (Signed)
gabapentin (NEURONTIN) 600 MG tablet  patient advised that pharmacy indicated that the refill was denied. please contact patient

## 2021-04-24 NOTE — Telephone Encounter (Signed)
[facility] [address]. Left voicemail notifying patient.

## 2021-04-24 NOTE — Telephone Encounter (Signed)
Contacted patient to let them know [facility] [address]. Patient did not answer but left a voicemail. Instructed patient to callback if she has any questions.  - WA, CMA

## 2021-04-28 ENCOUNTER — Other Ambulatory Visit: Payer: Self-pay

## 2021-04-28 DIAGNOSIS — E559 Vitamin D deficiency, unspecified: Secondary | ICD-10-CM

## 2021-04-28 DIAGNOSIS — I10 Essential (primary) hypertension: Secondary | ICD-10-CM

## 2021-04-28 DIAGNOSIS — E7800 Pure hypercholesterolemia, unspecified: Secondary | ICD-10-CM

## 2021-04-28 DIAGNOSIS — I639 Cerebral infarction, unspecified: Secondary | ICD-10-CM

## 2021-05-01 ENCOUNTER — Other Ambulatory Visit: Payer: BC Managed Care – PPO

## 2021-05-01 ENCOUNTER — Other Ambulatory Visit: Payer: Self-pay | Admitting: Physician Assistant

## 2021-05-01 ENCOUNTER — Other Ambulatory Visit: Payer: Self-pay

## 2021-05-01 DIAGNOSIS — E559 Vitamin D deficiency, unspecified: Secondary | ICD-10-CM

## 2021-05-01 DIAGNOSIS — I639 Cerebral infarction, unspecified: Secondary | ICD-10-CM

## 2021-05-01 DIAGNOSIS — E7800 Pure hypercholesterolemia, unspecified: Secondary | ICD-10-CM

## 2021-05-01 DIAGNOSIS — I10 Essential (primary) hypertension: Secondary | ICD-10-CM

## 2021-05-02 LAB — CBC WITH DIFFERENTIAL/PLATELET
Basophils Absolute: 0.1 10*3/uL (ref 0.0–0.2)
Basos: 1 %
EOS (ABSOLUTE): 0.3 10*3/uL (ref 0.0–0.4)
Eos: 3 %
Hematocrit: 49 % — ABNORMAL HIGH (ref 34.0–46.6)
Hemoglobin: 15.7 g/dL (ref 11.1–15.9)
Immature Grans (Abs): 0 10*3/uL (ref 0.0–0.1)
Immature Granulocytes: 0 %
Lymphocytes Absolute: 3.3 10*3/uL — ABNORMAL HIGH (ref 0.7–3.1)
Lymphs: 37 %
MCH: 30.9 pg (ref 26.6–33.0)
MCHC: 32 g/dL (ref 31.5–35.7)
MCV: 97 fL (ref 79–97)
Monocytes Absolute: 0.6 10*3/uL (ref 0.1–0.9)
Monocytes: 7 %
Neutrophils Absolute: 4.7 10*3/uL (ref 1.4–7.0)
Neutrophils: 52 %
Platelets: 371 10*3/uL (ref 150–450)
RBC: 5.08 x10E6/uL (ref 3.77–5.28)
RDW: 13.1 % (ref 11.7–15.4)
WBC: 9 10*3/uL (ref 3.4–10.8)

## 2021-05-02 LAB — COMPREHENSIVE METABOLIC PANEL
ALT: 14 IU/L (ref 0–32)
AST: 17 IU/L (ref 0–40)
Albumin/Globulin Ratio: 1.4 (ref 1.2–2.2)
Albumin: 3.7 g/dL — ABNORMAL LOW (ref 3.8–4.8)
Alkaline Phosphatase: 125 IU/L — ABNORMAL HIGH (ref 44–121)
BUN/Creatinine Ratio: 14 (ref 9–23)
BUN: 11 mg/dL (ref 6–24)
Bilirubin Total: <0.2 mg/dL (ref 0.0–1.2)
CO2: 30 mmol/L — ABNORMAL HIGH (ref 20–29)
Calcium: 9.5 mg/dL (ref 8.7–10.2)
Chloride: 98 mmol/L (ref 96–106)
Creatinine, Ser: 0.79 mg/dL (ref 0.57–1.00)
Globulin, Total: 2.6 g/dL (ref 1.5–4.5)
Glucose: 81 mg/dL (ref 65–99)
Potassium: 4 mmol/L (ref 3.5–5.2)
Sodium: 147 mmol/L — ABNORMAL HIGH (ref 134–144)
Total Protein: 6.3 g/dL (ref 6.0–8.5)
eGFR: 94 mL/min/{1.73_m2} (ref >59)

## 2021-05-02 LAB — TSH: TSH: 0.993 u[IU]/mL (ref 0.450–4.500)

## 2021-05-02 LAB — HEMOGLOBIN A1C
Est. average glucose Bld gHb Est-mCnc: 100 mg/dL
Hgb A1c MFr Bld: 5.1 % (ref 4.8–5.6)

## 2021-05-02 LAB — LIPID PANEL
Chol/HDL Ratio: 2.6 ratio (ref 0.0–4.4)
Cholesterol, Total: 186 mg/dL (ref 100–199)
HDL: 71 mg/dL (ref >39)
LDL Chol Calc (NIH): 79 mg/dL (ref 0–99)
Triglycerides: 220 mg/dL — ABNORMAL HIGH (ref 0–149)
VLDL Cholesterol Cal: 36 mg/dL (ref 5–40)

## 2021-05-03 ENCOUNTER — Encounter: Payer: Self-pay | Admitting: Physician Assistant

## 2021-05-03 ENCOUNTER — Other Ambulatory Visit: Payer: Self-pay

## 2021-05-03 ENCOUNTER — Ambulatory Visit: Payer: BC Managed Care – PPO | Admitting: Physician Assistant

## 2021-05-03 VITALS — BP 128/82 | HR 92 | Temp 97.7°F | Wt 95.8 lb

## 2021-05-03 DIAGNOSIS — E7800 Pure hypercholesterolemia, unspecified: Secondary | ICD-10-CM

## 2021-05-03 DIAGNOSIS — I69319 Unspecified symptoms and signs involving cognitive functions following cerebral infarction: Secondary | ICD-10-CM | POA: Diagnosis not present

## 2021-05-03 DIAGNOSIS — I69393 Ataxia following cerebral infarction: Secondary | ICD-10-CM | POA: Diagnosis not present

## 2021-05-03 DIAGNOSIS — R636 Underweight: Secondary | ICD-10-CM

## 2021-05-03 DIAGNOSIS — F4323 Adjustment disorder with mixed anxiety and depressed mood: Secondary | ICD-10-CM

## 2021-05-03 DIAGNOSIS — M792 Neuralgia and neuritis, unspecified: Secondary | ICD-10-CM

## 2021-05-03 DIAGNOSIS — I725 Aneurysm of other precerebral arteries: Secondary | ICD-10-CM

## 2021-05-03 MED ORDER — SERTRALINE HCL 25 MG PO TABS: 25.0000 mg | ORAL_TABLET | Freq: Every day | ORAL | 0 refills | Status: DC

## 2021-05-03 MED ORDER — GABAPENTIN 600 MG PO TABS: 600.0000 mg | ORAL_TABLET | Freq: Three times a day (TID) | ORAL | 1 refills | Status: DC

## 2021-05-03 NOTE — Progress Notes (Signed)
Established Patient Office Visit  Subjective:  Patient ID: [name] Barnett, female    DOB: [birth year]  Age: 45 y.o. MRN: [id number]  CC:  Chief Complaint  Patient presents with  . Follow-up    Mood, Weight     HPI [name] Barnett presents for follow up on mood management and weight. Patient reports she did not tolerate mirtazapine, she stayed in bed for several days.  Tried Paxil and makes her dizzy.  Patient has not tried anything else in addition to fluoxetine.  Reports is willing to trial another medication.  Reports has 2 boost drinks a day to help with her weight.  Is making an effort to eat more, states she knows what she needs to do.  States did not realize how much gabapentin helps with her symptoms. When she did not take medication she had severe numbness of right side of face and body, which is why she requested a refill.  Wants to resume medication.  Past Medical History:  Diagnosis Date  . Duodenal obstruction   . GERD (gastroesophageal reflux disease)   . Hypertension   . Stroke [facility])    april 2018, lt sided weakness  . Trimalleolar fracture of ankle, closed, right, initial encounter     Past Surgical History:  Procedure Laterality Date  . BALLOON DILATION  12/31/2011   Procedure: BALLOON DILATION;  Surgeon: Missy Sabins, MD;  Location: [facility] ENDOSCOPY;  Service: Endoscopy;  Laterality: N/A;  . CHOLECYSTECTOMY  07/28/11  . ESOPHAGOGASTRODUODENOSCOPY  10/17/2011   Procedure: ESOPHAGOGASTRODUODENOSCOPY (EGD);  Surgeon: Missy Sabins, MD;  Location: [facility] ENDOSCOPY;  Service: Endoscopy;  Laterality: N/A;  . IR ANGIO INTRA EXTRACRAN SEL INTERNAL CAROTID BILAT MOD SED  04/07/2017  . IR ANGIO INTRA EXTRACRAN SEL INTERNAL CAROTID BILAT MOD SED  01/07/2018  . IR ANGIO VERTEBRAL SEL VERTEBRAL UNI R MOD SED  04/07/2017  . IR ANGIO VERTEBRAL SEL VERTEBRAL UNI R MOD SED  01/07/2018  . IR ANGIOGRAM FOLLOW UP STUDY  04/07/2017  . IR ANGIOGRAM FOLLOW UP STUDY  04/07/2017  . IR ANGIOGRAM FOLLOW UP  STUDY  04/07/2017  . IR ANGIOGRAM FOLLOW UP STUDY  04/07/2017  . IR ANGIOGRAM FOLLOW UP STUDY  04/07/2017  . IR ANGIOGRAM SELECTIVE EACH ADDITIONAL VESSEL  04/07/2017  . IR TRANSCATH/EMBOLIZ  04/07/2017  . IR US GUIDE VASC ACCESS RIGHT  01/07/2018  . ORIF ANKLE FRACTURE Right 07/01/2018   Procedure: RIGHT ANKLE FRACTURE OPEN TREATMENT TRIMALLEOLAR INCLUDES INTERNAL FIXATION WITHOUT FIXATION OF POSTERIOR LIP, FRACTURE CLOSED TREATMENT TRIMALLEOLAR ANKLE WITH MANIPULATION, REPAIR SYNDESMOSIS, REPAIR DELTOID LIGAMENT;  Surgeon: Renette Butters, MD;  Location: [place];  Service: Orthopedics;  Laterality: Right;  . PARS PLANA VITRECTOMY Right 05/01/2017   Procedure: PARS PLANA VITRECTOMY WITH 25 GAUGE RIGHT EYE, endolaser photocoaglation;  Surgeon: Jalene Mullet, MD;  Location: [place];  Service: Ophthalmology;  Laterality: Right;  . RADIOLOGY WITH ANESTHESIA N/A 04/07/2017   Procedure: RADIOLOGY WITH ANESTHESIA;  Surgeon: Consuella Lose, MD;  Location: [place];  Service: Radiology;  Laterality: N/A;  . REPLACEMENT TOTAL KNEE BILATERAL  2004  . TEMPOROMANDIBULAR JOINT SURGERY     2 surgeries  . TUMOR REMOVAL    . VAGOTOMY  01/23/2012   Procedure: VAGOTOMY, antrectomy and BII;  Surgeon: Haywood Lasso, MD;  Location: [place];  Service: General;  Laterality: N/A;  Laparotomy with vagotomy.    Family History  Problem Relation Age of Onset  . Hypertension Mother   .  Restless legs syndrome Mother   . Hyperlipidemia Mother   . Heart disease Father   . Malignant hyperthermia Neg Hx     Social History   Socioeconomic History  . Marital status: Married    Spouse name: Not on file  . Number of children: Not on file  . Years of education: Not on file  . Highest education level: Not on file  Occupational History  . Not on file  Tobacco Use  . Smoking status: Current Every Day Smoker    Packs/day: 1.00    Years: 20.00    Pack years: 20.00    Types: Cigarettes    Last attempt  to quit: 06/24/2018    Years since quitting: 2.8  . Smokeless tobacco: Never Used  Vaping Use  . Vaping Use: Never used  Substance and Sexual Activity  . Alcohol use: Yes    Comment: social  . Drug use: No  . Sexual activity: Not Currently    Partners: Male    Birth control/protection: None  Other Topics Concern  . Not on file  Social History Narrative   Lives in [place] with husband and 2 kids. Works as a Emergency planning/management officer.   Social Determinants of Health   Financial Resource Strain: Not on file  Food Insecurity: Not on file  Transportation Needs: Not on file  Physical Activity: Not on file  Stress: Not on file  Social Connections: Not on file  Intimate Partner Violence: Not on file    Outpatient Medications Prior to Visit  Medication Sig Dispense Refill  . amLODipine (NORVASC) 5 MG tablet Take 1 tablet (5 mg total) by mouth daily. 90 tablet 1  . atorvastatin (LIPITOR) 20 MG tablet Take 1 tablet by mouth at bedtime. 90 tablet 1  . Multiple Vitamin (MULTIVITAMIN WITH MINERALS) TABS tablet Take 1 tablet by mouth daily.    . Vitamin D, Ergocalciferol, (DRISDOL) 1.25 MG (50000 UNIT) CAPS capsule Take 1 capsule (50,000 Units total) by mouth every 7 (seven) days. 12 capsule 3  . gabapentin (NEURONTIN) 600 MG tablet Take 1 tablet (600 mg total) by mouth 3 (three) times daily. 90 tablet 0  . PARoxetine (PAXIL) 10 MG tablet Take 1 tablet (10 mg total) by mouth at bedtime. (Patient not taking: Reported on 05/03/2021) 30 tablet 0   No facility-administered medications prior to visit.    Allergies  Allergen Reactions  . Nsaids Other (See Comments)    Pt diagnosed with near-obstructing circumferential ulcer of duodenum Nov2012    ROS Review of Systems Review of Systems:  A fourteen system review of systems was performed and found to be positive as per HPI.    Objective:    Physical Exam General: Thin appearing, cooperative, in no acute distress Neuro:  Alert and oriented,   extra-ocular muscles intact  HEENT:  Normocephalic, atraumatic, neck supple Skin:  no gross rash, warm, pink. Cardiac:  RRR Respiratory:  Not using accessory muscles, speaking in full sentences- unlabored. Vascular:  Ext warm, no cyanosis apprec.; cap RF less 2 sec. Psych:  No HI/SI, judgement and insight good, Euthymic mood. Full Affect.   BP 128/82   Pulse 92   Temp 97.7 F (36.5 C)   Wt 95 lb 12 oz (43.4 kg)   SpO2 97%   BMI 16.96 kg/m  Wt Readings from Last 3 Encounters:  05/03/21 95 lb 12 oz (43.4 kg)  03/08/21 105 lb 4.8 oz (47.8 kg)  05/30/20 136 lb 4.8 oz (61.8 kg)  Health Maintenance Due  Topic Date Due  . PAP SMEAR-Modifier  Never done  . COLONOSCOPY (Pts 45-31yr Insurance coverage will need to be confirmed)  Never done  . COVID-19 Vaccine (3 - Booster for Moderna series) 12/26/2020    There are no preventive care reminders to display for this patient.  Lab Results  Component Value Date   TSH 0.993 05/01/2021   Lab Results  Component Value Date   WBC 9.0 05/01/2021   HGB 15.7 05/01/2021   HCT 49.0 (H) 05/01/2021   MCV 97 05/01/2021   PLT 371 05/01/2021   Lab Results  Component Value Date   NA 147 (H) 05/01/2021   K 4.0 05/01/2021   CO2 30 (H) 05/01/2021   GLUCOSE 81 05/01/2021   BUN 11 05/01/2021   CREATININE 0.79 05/01/2021   BILITOT <0.2 05/01/2021   ALKPHOS 125 (H) 05/01/2021   AST 17 05/01/2021   ALT 14 05/01/2021   PROT 6.3 05/01/2021   ALBUMIN 3.7 (L) 05/01/2021   CALCIUM 9.5 05/01/2021   ANIONGAP 11 01/07/2018   EGFR 94 05/01/2021   Lab Results  Component Value Date   CHOL 186 05/01/2021   Lab Results  Component Value Date   HDL 71 05/01/2021   Lab Results  Component Value Date   LDLCALC 79 05/01/2021   Lab Results  Component Value Date   TRIG 220 (H) 05/01/2021   Lab Results  Component Value Date   CHOLHDL 2.6 05/01/2021   Lab Results  Component Value Date   HGBA1C 5.1 05/01/2021      Assessment & Plan:    Problem List Items Addressed This Visit      Cardiovascular and Mediastinum   Basilar artery aneurysm ([name]) (Chronic)   Relevant Medications   gabapentin (NEURONTIN) 600 MG tablet     Nervous and Auditory   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)   Relevant Medications   gabapentin (NEURONTIN) 600 MG tablet   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  Denies SI/HI.  Discussed with patient alternative treatment options and willing to trial sertraline 25 mg.  Discussed potential side effects and advised to let me know if unable to tolerate medication or dose ineffective. -Patient has been offered referral to psychiatry and continues to decline. -Will continue to monitor.  Underweight due to inadequate caloric intake: -Patient has lost 10 pounds since last visit. Did not tolerate paroxetine and mirtazapine due to side effects. -Recommend to continue with Boost drinks and will consider referral to nutritionist if patient is agreeable. -Will reassess at follow up visit.  Basilar artery aneurysm, cognitive deficit due to old embolic stroke, neuritis-right-sided, ataxia poststroke: -S/p coil embolization of basilar artery aneurysm 04/19/2017. -Will continue Gabapentin. Provided 90 day supply.  Elevated LDL cholesterol level: -Discussed with patient recent lipid panel: Total cholesterol 186, triglycerides 220, HDL 71, LDL 79 -Continue current medication regimen.  Recent  ALT 14, normal. -Will continue to monitor.  Discussed most recent lab results which are essentially within normal limits or stable from prior.   Meds ordered this encounter  Medications  . sertraline (ZOLOFT) 25 MG tablet    Sig: Take 1 tablet (25 mg total) by mouth daily.    Dispense:  30 tablet    Refill:  0    Order Specific Question:   Supervising Provider    Answer:   Beatrice Lecher D [[id number]]  . gabapentin (NEURONTIN) 600 MG tablet    Sig: Take 1 tablet (600 mg total) by mouth 3 (three) times daily.    Dispense:  270 tablet    Refill:  1    Order Specific Question:   Supervising Provider    Answer:   Beatrice Lecher D [[id number]]    Follow-up: Return in about 3 months (around 08/03/2021) for Mood, Weight.   Note:  This note was prepared with assistance of Dragon voice recognition software. Occasional wrong-word or sound-a-like substitutions may have occurred due to the inherent limitations of voice recognition software.  Lorrene Reid, PA-C

## 2021-05-23 ENCOUNTER — Other Ambulatory Visit: Payer: Self-pay | Admitting: Physician Assistant

## 2021-05-23 DIAGNOSIS — F4323 Adjustment disorder with mixed anxiety and depressed mood: Secondary | ICD-10-CM

## 2021-05-24 ENCOUNTER — Telehealth: Payer: Self-pay | Admitting: Physician Assistant

## 2021-05-24 ENCOUNTER — Other Ambulatory Visit: Payer: Self-pay

## 2021-05-24 DIAGNOSIS — F4323 Adjustment disorder with mixed anxiety and depressed mood: Secondary | ICD-10-CM

## 2021-05-24 MED ORDER — SERTRALINE HCL 25 MG PO TABS: 25.0000 mg | ORAL_TABLET | Freq: Every day | ORAL | 0 refills | Status: DC

## 2021-05-24 NOTE — Telephone Encounter (Signed)
[facility] a [facility]ill [facility]ine [facility] [address] thanks.

## 2021-05-24 NOTE — Telephone Encounter (Signed)
[facility] [address].

## 2021-06-03 ENCOUNTER — Other Ambulatory Visit: Payer: Self-pay | Admitting: Physician Assistant

## 2021-06-03 DIAGNOSIS — F4323 Adjustment disorder with mixed anxiety and depressed mood: Secondary | ICD-10-CM

## 2021-08-03 ENCOUNTER — Ambulatory Visit: Payer: BC Managed Care – PPO | Admitting: Physician Assistant

## 2021-08-10 ENCOUNTER — Other Ambulatory Visit: Payer: Self-pay | Admitting: Physician Assistant

## 2021-08-10 DIAGNOSIS — F4323 Adjustment disorder with mixed anxiety and depressed mood: Secondary | ICD-10-CM

## 2021-09-08 ENCOUNTER — Other Ambulatory Visit: Payer: Self-pay | Admitting: Physician Assistant

## 2021-09-08 DIAGNOSIS — E559 Vitamin D deficiency, unspecified: Secondary | ICD-10-CM

## 2021-09-10 ENCOUNTER — Other Ambulatory Visit: Payer: Self-pay | Admitting: Physician Assistant

## 2021-09-10 DIAGNOSIS — E785 Hyperlipidemia, unspecified: Secondary | ICD-10-CM

## 2021-09-10 DIAGNOSIS — I10 Essential (primary) hypertension: Secondary | ICD-10-CM

## 2021-10-28 ENCOUNTER — Other Ambulatory Visit: Payer: Self-pay | Admitting: Physician Assistant

## 2021-10-28 DIAGNOSIS — M792 Neuralgia and neuritis, unspecified: Secondary | ICD-10-CM

## 2021-10-28 DIAGNOSIS — I725 Aneurysm of other precerebral arteries: Secondary | ICD-10-CM

## 2021-10-28 DIAGNOSIS — I69393 Ataxia following cerebral infarction: Secondary | ICD-10-CM

## 2021-10-28 DIAGNOSIS — I69319 Unspecified symptoms and signs involving cognitive functions following cerebral infarction: Secondary | ICD-10-CM

## 2021-11-08 ENCOUNTER — Other Ambulatory Visit: Payer: Self-pay | Admitting: Physician Assistant

## 2021-11-08 DIAGNOSIS — F4323 Adjustment disorder with mixed anxiety and depressed mood: Secondary | ICD-10-CM

## 2021-11-27 ENCOUNTER — Other Ambulatory Visit: Payer: Self-pay | Admitting: Physician Assistant

## 2021-11-27 DIAGNOSIS — E559 Vitamin D deficiency, unspecified: Secondary | ICD-10-CM

## 2021-12-21 ENCOUNTER — Encounter: Payer: Self-pay | Admitting: Physician Assistant

## 2021-12-21 ENCOUNTER — Ambulatory Visit (INDEPENDENT_AMBULATORY_CARE_PROVIDER_SITE_OTHER): Payer: BC Managed Care – PPO | Admitting: Physician Assistant

## 2021-12-21 ENCOUNTER — Other Ambulatory Visit: Payer: Self-pay

## 2021-12-21 VITALS — BP 120/86 | HR 82 | Temp 97.1°F | Ht 63.0 in | Wt 87.4 lb

## 2021-12-21 DIAGNOSIS — E7800 Pure hypercholesterolemia, unspecified: Secondary | ICD-10-CM

## 2021-12-21 DIAGNOSIS — R636 Underweight: Secondary | ICD-10-CM

## 2021-12-21 DIAGNOSIS — I10 Essential (primary) hypertension: Secondary | ICD-10-CM | POA: Diagnosis not present

## 2021-12-21 DIAGNOSIS — F4323 Adjustment disorder with mixed anxiety and depressed mood: Secondary | ICD-10-CM | POA: Diagnosis not present

## 2021-12-21 MED ORDER — DRONABINOL 2.5 MG PO CAPS: 2.5000 mg | ORAL_CAPSULE | Freq: Two times a day (BID) | ORAL | 0 refills | Status: DC

## 2021-12-21 NOTE — Assessment & Plan Note (Signed)
>>  ASSESSMENT AND PLAN FOR ELEVATED LDL CHOLESTEROL LEVEL WRITTEN ON 12/21/2021  5:19 PM BY ABONZA, MARITZA, PA-C  -LDL 79 (goal <70). Continue current medication regimen. Advised to schedule lab visit for FBW including lipid panel and hepatic function. Lipid Panel     Component Value Date/Time   CHOL 186 05/01/2021 0920   TRIG 220 (H) 05/01/2021 0920   HDL 71 05/01/2021 0920   CHOLHDL 2.6 05/01/2021 0920   LDLCALC 79 05/01/2021 0920   LABVLDL 36 05/01/2021 0920

## 2021-12-21 NOTE — Progress Notes (Signed)
Established Patient Office Visit  Subjective:  Patient ID: [name] Barnett, female    DOB: [birth year]  Age: 46 y.o. MRN: [id number]  CC:  Chief Complaint  Patient presents with   Follow-up    Mood, Weight     HPI [name] presents for follow up on hypertension, hyperlipidemia and mood. Patient reports tried the Boost drinks which upset her stomach so she stopped them. States has not had anything to eat for today yet except for coffee. Likes to eat fruit, vegetables, and salads. Patient reports has deficits and pain from her stroke but other than that feels fine, no new pains, fever or night sweats.  HTN: Pt denies chest pain, palpitations, syncope, shortness of breath or lower extremity swelling. Taking medication as directed without side effects. Does not check BP at home.  HLD: Pt taking medication as directed without problems.  Mood: Patient reports tolerating sertraline without issues. States her daughter stresses her out and constantly worries about her. Denies SI/HI.  Past Medical History:  Diagnosis Date   Duodenal obstruction    GERD (gastroesophageal reflux disease)    Hypertension    Stroke [facility])    april 2018, lt sided weakness   Trimalleolar fracture of ankle, closed, right, initial encounter     Past Surgical History:  Procedure Laterality Date   BALLOON DILATION  12/31/2011   Procedure: BALLOON DILATION;  Surgeon: Missy Sabins, MD;  Location: [place];  Service: Endoscopy;  Laterality: N/A;   CHOLECYSTECTOMY  07/28/11   ESOPHAGOGASTRODUODENOSCOPY  10/17/2011   Procedure: ESOPHAGOGASTRODUODENOSCOPY (EGD);  Surgeon: Missy Sabins, MD;  Location[facility] ENDOSCOPY;  Service: Endoscopy;  Laterality: N/A;   IR ANGIO INTRA EXTRACRAN SEL INTERNAL CAROTID BILAT MOD SED  04/07/2017   IR ANGIO INTRA EXTRACRAN SEL INTERNAL CAROTID BILAT MOD SED  01/07/2018   IR ANGIO VERTEBRAL SEL VERTEBRAL UNI R MOD SED  04/07/2017   IR ANGIO VERTEBRAL SEL VERTEBRAL UNI R MOD SED  01/07/2018    IR ANGIOGRAM FOLLOW UP STUDY  04/07/2017   IR ANGIOGRAM FOLLOW UP STUDY  04/07/2017   IR ANGIOGRAM FOLLOW UP STUDY  04/07/2017   IR ANGIOGRAM FOLLOW UP STUDY  04/07/2017   IR ANGIOGRAM FOLLOW UP STUDY  04/07/2017   IR ANGIOGRAM SELECTIVE EACH ADDITIONAL VESSEL  04/07/2017   IR TRANSCATH/EMBOLIZ  04/07/2017   IR US GUIDE VASC ACCESS RIGHT  01/07/2018   ORIF ANKLE FRACTURE Right 07/01/2018   Procedure: RIGHT ANKLE FRACTURE OPEN TREATMENT TRIMALLEOLAR INCLUDES INTERNAL FIXATION WITHOUT FIXATION OF POSTERIOR LIP, FRACTURE CLOSED TREATMENT TRIMALLEOLAR ANKLE WITH MANIPULATION, REPAIR SYNDESMOSIS, REPAIR DELTOID LIGAMENT;  Surgeon: Renette Butters, MD;  Location: [place];  Service: Orthopedics;  Laterality: Right;   PARS PLANA VITRECTOMY Right 05/01/2017   Procedure: PARS PLANA VITRECTOMY WITH 25 GAUGE RIGHT EYE, endolaser photocoaglation;  Surgeon: Jalene Mullet, MD;  Location: [place];  Service: Ophthalmology;  Laterality: Right;   RADIOLOGY WITH ANESTHESIA N/A 04/07/2017   Procedure: RADIOLOGY WITH ANESTHESIA;  Surgeon: Consuella Lose, MD;  Location: [place];  Service: Radiology;  Laterality: N/A;   REPLACEMENT TOTAL KNEE BILATERAL  2004   TEMPOROMANDIBULAR JOINT SURGERY     2 surgeries   TUMOR REMOVAL     VAGOTOMY  01/23/2012   Procedure: VAGOTOMY, antrectomy and BII;  Surgeon: Haywood Lasso, MD;  Location: [place];  Service: General;  Laterality: N/A;  Laparotomy with vagotomy.    Family History  Problem Relation Age of Onset  Hypertension Mother    Restless legs syndrome Mother    Hyperlipidemia Mother    Heart disease Father    Malignant hyperthermia Neg Hx     Social History   Socioeconomic History   Marital status: Married    Spouse name: Not on file   Number of children: Not on file   Years of education: Not on file   Highest education level: Not on file  Occupational History   Not on file  Tobacco Use   Smoking status: Every Day    Packs/day: 1.00     Years: 20.00    Pack years: 20.00    Types: Cigarettes    Last attempt to quit: 06/24/2018    Years since quitting: 3.4   Smokeless tobacco: Never  Vaping Use   Vaping Use: Never used  Substance and Sexual Activity   Alcohol use: Yes    Comment: social   Drug use: No   Sexual activity: Not Currently    Partners: Male    Birth control/protection: None  Other Topics Concern   Not on file  Social History Narrative   Lives in [place] with husband and 2 kids. Works as a Emergency planning/management officer.   Social Determinants of Health   Financial Resource Strain: Not on file  Food Insecurity: Not on file  Transportation Needs: Not on file  Physical Activity: Not on file  Stress: Not on file  Social Connections: Not on file  Intimate Partner Violence: Not on file    Outpatient Medications Prior to Visit  Medication Sig Dispense Refill   amLODipine (NORVASC) 5 MG tablet Take 1 tablet (5 mg total) by mouth daily. **PLEASE CONTACT OUR OFFICE TO SCHEDULE A FOLLOW UP FOR FUTURE MED REFILLS** 90 tablet 1   atorvastatin (LIPITOR) 20 MG tablet TAKE 1 TABLET BY MOUTH EVERYDAY AT BEDTIME **PLEASE CONTACT OUR OFFICE TO SCHEDULE A FOLLOW UP FOR FUTURE MED REFILLS** 90 tablet 1   gabapentin (NEURONTIN) 600 MG tablet Take 1 tablet (600 mg total) by mouth 3 (three) times daily. Please contact the office to schedule a follow up visit for further refills. Thank you. 270 tablet 0   Multiple Vitamin (MULTIVITAMIN WITH MINERALS) TABS tablet Take 1 tablet by mouth daily.     sertraline (ZOLOFT) 25 MG tablet TAKE 1 TABLET (25 MG TOTAL) BY MOUTH DAILY. 90 tablet 0   Vitamin D, Ergocalciferol, (DRISDOL) 1.25 MG (50000 UNIT) CAPS capsule Take 1 capsule (50,000 Units total) by mouth every 7 (seven) days. **PLEASE CONTACT OUR OFFICE TO SCHEDULE A LAB VISIT FOR FUTURE MED REFILLS** 12 capsule 0   No facility-administered medications prior to visit.    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   Objective:    Physical Exam General: Thin-appearing female, in no acute distress  Neuro:  Alert and oriented,  extra-ocular muscles intact  HEENT:  Normocephalic, atraumatic, neck supple  Skin:  no gross rash, warm, pink. Cardiac:  RRR, S1 S2 Respiratory:  ECTA B/L and A/P, Not using accessory muscles, speaking in full sentences- unlabored. Vascular:  Ext warm, no cyanosis apprec.; cap RF less 2 sec. Psych:  No HI/SI, judgement and insight good, Euthymic mood. Full Affect.  BP 120/86    Pulse 82  Temp (!) 97.1 F (36.2 C)    Ht 5' 3"  (1.6 m)    Wt 87 lb 6.4 oz (39.6 kg)    SpO2 98%    BMI 15.48 kg/m  Wt Readings from Last 3 Encounters:  12/21/21 87 lb 6.4 oz (39.6 kg)  05/03/21 95 lb 12 oz (43.4 kg)  03/08/21 105 lb 4.8 oz (47.8 kg)     Health Maintenance Due  Topic Date Due   Pneumococcal Vaccine 34-56 Years old (1 - PCV) Never done   PAP SMEAR-Modifier  Never done   COLONOSCOPY (Pts 45-73yr Insurance coverage will need to be confirmed)  Never done   COVID-19 Vaccine (3 - Moderna risk series) 08/23/2020   INFLUENZA VACCINE  Never done    There are no preventive care reminders to display for this patient.  Lab Results  Component Value Date   TSH 0.993 05/01/2021   Lab Results  Component Value Date   WBC 9.0 05/01/2021   HGB 15.7 05/01/2021   HCT 49.0 (H) 05/01/2021   MCV 97 05/01/2021   PLT 371 05/01/2021   Lab Results  Component Value Date   NA 147 (H) 05/01/2021   K 4.0 05/01/2021   CO2 30 (H) 05/01/2021   GLUCOSE 81 05/01/2021   BUN 11 05/01/2021   CREATININE 0.79 05/01/2021   BILITOT <0.2 05/01/2021   ALKPHOS 125 (H) 05/01/2021   AST 17 05/01/2021   ALT 14 05/01/2021   PROT 6.3 05/01/2021   ALBUMIN 3.7 (L) 05/01/2021   CALCIUM 9.5  05/01/2021   ANIONGAP 11 01/07/2018   EGFR 94 05/01/2021   Lab Results  Component Value Date   CHOL 186 05/01/2021   Lab Results  Component Value Date   HDL 71 05/01/2021   Lab Results  Component Value Date   LDLCALC 79 05/01/2021   Lab Results  Component Value Date   TRIG 220 (H) 05/01/2021   Lab Results  Component Value Date   CHOLHDL 2.6 05/01/2021   Lab Results  Component Value Date   HGBA1C 5.1 05/01/2021   Depression screen PHQ 2/9 12/21/2021 05/03/2021 03/08/2021 05/30/2020 11/23/2019  Decreased Interest 1 2 2  0 0  Down, Depressed, Hopeless 0 1 2 0 0  PHQ - 2 Score 1 3 4  0 0  Altered sleeping 1 0 0 3 1  Tired, decreased energy 3 3 3 3 1   Change in appetite 1 3 3 3  0  Feeling bad or failure about yourself  0 0 0 0 0  Trouble concentrating 1 2 0 0 0  Moving slowly or fidgety/restless 0 2 2 0 0  Suicidal thoughts 0 0 0 0 0  PHQ-9 Score 7 13 12 9 2   Difficult doing work/chores Somewhat difficult Not difficult at all - Somewhat difficult Not difficult at all  Some recent data might be hidden   GAD 7 : Generalized Anxiety Score 12/21/2021 05/03/2021 05/30/2020 04/28/2019  Nervous, Anxious, on Edge 0 2 0 0  Control/stop worrying 1 0 0 0  Worry too much - different things 1 0 0 0  Trouble relaxing 1 2 3 2   Restless 1 2 1 3   Easily annoyed or irritable 1 2 3  0  Afraid - awful might happen 0 0 0 0  Total GAD 7 Score 5 8 7 5   Anxiety Difficulty Somewhat difficult Not difficult at all Very difficult Somewhat difficult        Assessment & Plan:   Problem List Items Addressed This  Visit       Cardiovascular and Mediastinum   HTN, goal below 130/80    -BP initially elevated, BP repeated and improved, stable. -Continue current medication regimen. -Will continue to monitor.        Other   Elevated LDL cholesterol level (Chronic)    -LDL 79 (goal <70). Continue current medication regimen. Advised to schedule lab visit for FBW including lipid panel and hepatic  function. Lipid Panel     Component Value Date/Time   CHOL 186 05/01/2021 0920   TRIG 220 (H) 05/01/2021 0920   HDL 71 05/01/2021 0920   CHOLHDL 2.6 05/01/2021 0920   LDLCALC 79 05/01/2021 0920   LABVLDL 36 05/01/2021 0920        Adjustment disorder with mixed anxiety and depressed mood - Primary    -Mild improvement. Discussed with patient can trial 50 mg of sertraline (take 2 tab of 25 mg) and if dose effective and tolerates it well then advised to let me know and will send rx for sertraline 50 mg. If does not tolerate 50 mg then recommend to resume 25 mg. Patient verbalized understanding. -Will continue to monitor.      Other Visit Diagnoses     Underweight due to inadequate caloric intake       Relevant Medications   dronabinol (MARINOL) 2.5 MG capsule      Underweight due to inadequate caloric intake: -Patient continues to lose weight, has lost ~9 pounds since visit 05/03/2021.[name] Patient has tried and failed Paxil and Mirtazapine. Discussed referral to nutritionist and patient declined. Discussed with patient risks vs benefits of appetite stimulants and given significant weight decline it is reasonable to consider starting Marinol. Will trial 30 day supply. Advised to let me know if unable to tolerate medication. Discussed with patient the importance of avoiding to skip meals.  Meds ordered this encounter  Medications   dronabinol (MARINOL) 2.5 MG capsule    Sig: Take 1 capsule (2.5 mg total) by mouth 2 (two) times daily before lunch and supper.    Dispense:  60 capsule    Refill:  0    Order Specific Question:   Supervising Provider    Answer:   Beatrice Lecher D [[id number]]    Follow-up: Return in about 3 months (around 03/21/2022) for CPE; FBW include Vit D in 1-4 weeks .    Lorrene Reid, PA-C

## 2021-12-21 NOTE — Patient Instructions (Signed)
High-Protein and High-Calorie Diet Eating high-protein and high-calorie foods can help you to gain weight, heal after an injury, and recover after an illness or surgery. The specific amount of daily protein and calories you need depends on: Your body weight. The reason this diet is recommended for you. Generally, a high-protein, high-calorie diet involves: Eating 250-500 extra calories each day. Making sure that you get enough of your daily calories from protein. Ask your health care provider how many of your calories should come from protein. Talk with a health care provider or a dietitian about how much protein and how many calories you need each day. Follow the diet as directed by your health care provider. What are tips for following this plan? Reading food labels Check the nutrition facts label for calories, grams of fat and protein. Items with more than 4 grams of protein are high-protein foods. Preparing meals Add whole milk, half-and-half, or heavy cream to cereal, pudding, soup, or hot cocoa. Add whole milk to instant breakfast drinks. Add peanut butter to oatmeal or smoothies. Add powdered milk to baked goods, smoothies, or milkshakes. Add powdered milk, cream, or butter to mashed potatoes. Add cheese to cooked vegetables. Make whole-milk yogurt parfaits. Top them with granola, fruit, or nuts. Add cottage cheese to fruit. Add avocado, cheese, or both to sandwiches or salads. Add avocado to smoothies. Add meat, poultry, or seafood to rice, pasta, casseroles, salads, and soups. Use mayonnaise when making egg salad, chicken salad, or tuna salad. Use peanut butter as a dip for fruits and vegetables or as a topping for pretzels, celery, or crackers. Add beans to casseroles, dips, and spreads. Add pureed beans to sauces and soups. Replace calorie-free drinks with calorie-containing drinks, such as milk and fruit juice. Replace water with milk or heavy cream when making foods such as  oatmeal, pudding, or cocoa. Add oil or butter to cooked vegetables and grains. Add cream cheese to sandwiches or as a topping on crackers and bread. Make cream-based pastas and soups. General information Ask your health care provider if you should take a nutritional supplement. Try to eat six small meals each day instead of three large meals. A general goal is to eat every 2 to 3 hours. Eat a balanced diet. In each meal, include one food that is high in protein and one food with fat in it. Keep nutritious snacks available, such as nuts, trail mixes, dried fruit, and yogurt. If you have kidney disease or diabetes, talk with your health care provider about how much protein is safe for you. Too much protein may put extra stress on your kidneys. Drink your calories. Choose high-calorie drinks and have them after your meals. Consider setting a timer to remind you to eat. You will want to eat even if you do not feel very hungry. What high-protein foods should I eat? Vegetables Soybeans. Peas. Grains Quinoa. Bulgur wheat. Buckwheat. Meats and other proteins Beef, pork, and poultry. Fish and seafood. Eggs. Tofu. Textured vegetable protein (TVP). Peanut butter. Nuts and seeds. Dried beans. Protein powders. Hummus. Dairy Whole milk. Whole-milk yogurt. Powdered milk. Cheese. Cottage Cheese. Eggnog. Beverages High-protein supplement drinks. Soy milk. Other foods Protein bars. The items listed above may not be a complete list of foods and beverages you can eat and drink. Contact a dietitian for more information. What high-calorie foods should I eat? Fruits Dried fruit. Fruit leather. Canned fruit in syrup. Fruit juice. Avocado. Vegetables Vegetables cooked in oil or butter. Fried potatoes. Grains Pasta. Quick   breads. Muffins. Pancakes. Ready-to-eat cereal. Meats and other proteins Peanut butter. Nuts and seeds. Dairy Heavy cream. Whipped cream. Cream cheese. Sour cream. Ice cream. Custard.  Pudding. Whole milk dairy products. Beverages Meal-replacement beverages. Nutrition shakes. Fruit juice. Seasonings and condiments Salad dressing. Mayonnaise. Alfredo sauce. Fruit preserves or jelly. Honey. Syrup. Sweets and desserts Cake. Cookies. Pie. Pastries. Candy bars. Chocolate. Fats and oils Butter or margarine. Oil. Gravy. Other foods Meal-replacement bars. The items listed above may not be a complete list of foods and beverages you can eat and drink. Contact a dietitian for more information. Summary A high-protein, high-calorie diet can help you gain weight or heal faster after an injury, illness, or surgery. To increase your protein and calories, add ingredients such as whole milk, peanut butter, cheese, beans, meat, or seafood to meal items. To get enough extra calories each day, include high-calorie foods and beverages at each meal. Adding a high-calorie drink or shake can be an easy way to help you get enough calories each day. Talk with your healthcare provider or dietitian about the best options for you. This information is not intended to replace advice given to you by your health care provider. Make sure you discuss any questions you have with your health care provider. Document Revised: 10/30/2020 Document Reviewed: 10/30/2020 Elsevier Patient Education  2022 [employer]  

## 2021-12-21 NOTE — Assessment & Plan Note (Signed)
-  LDL 79 (goal <70). Continue current medication regimen. Advised to schedule lab visit for FBW including lipid panel and hepatic function. Lipid Panel     Component Value Date/Time   CHOL 186 05/01/2021 0920   TRIG 220 (H) 05/01/2021 0920   HDL 71 05/01/2021 0920   CHOLHDL 2.6 05/01/2021 0920   LDLCALC 79 05/01/2021 0920   LABVLDL 36 05/01/2021 0920

## 2021-12-21 NOTE — Assessment & Plan Note (Signed)
-  Mild improvement. Discussed with patient can trial 50 mg of sertraline (take 2 tab of 25 mg) and if dose effective and tolerates it well then advised to let me know and will send rx for sertraline 50 mg. If does not tolerate 50 mg then recommend to resume 25 mg. Patient verbalized understanding. -Will continue to monitor.

## 2021-12-21 NOTE — Assessment & Plan Note (Signed)
-  BP initially elevated, BP repeated and improved, stable. -Continue current medication regimen. -Will continue to monitor.

## 2021-12-25 ENCOUNTER — Telehealth: Payer: Self-pay | Admitting: Physician Assistant

## 2021-12-25 ENCOUNTER — Other Ambulatory Visit: Payer: Self-pay

## 2021-12-25 ENCOUNTER — Other Ambulatory Visit: Payer: BC Managed Care – PPO

## 2021-12-25 DIAGNOSIS — Z130 Encounter for screening for diseases of the blood and blood-forming organs and certain disorders involving the immune mechanism: Secondary | ICD-10-CM

## 2021-12-25 DIAGNOSIS — E559 Vitamin D deficiency, unspecified: Secondary | ICD-10-CM

## 2021-12-25 DIAGNOSIS — R636 Underweight: Secondary | ICD-10-CM

## 2021-12-25 DIAGNOSIS — Z1329 Encounter for screening for other suspected endocrine disorder: Secondary | ICD-10-CM

## 2021-12-25 DIAGNOSIS — Z0000 Encounter for general adult medical examination without abnormal findings: Secondary | ICD-10-CM

## 2021-12-25 NOTE — Telephone Encounter (Signed)
Patient called and requested her Zoloft be filled for a 90 day supply and also the medication that she was put on to increase her appetite is no longer available and needs it changed. Please advise. [phone]

## 2021-12-26 LAB — CBC WITH DIFFERENTIAL/PLATELET
Basophils Absolute: 0.1 10*3/uL (ref 0.0–0.2)
Basos: 1 %
EOS (ABSOLUTE): 0.3 10*3/uL (ref 0.0–0.4)
Eos: 3 %
Hematocrit: 50.6 % — ABNORMAL HIGH (ref 34.0–46.6)
Hemoglobin: 16.5 g/dL — ABNORMAL HIGH (ref 11.1–15.9)
Immature Grans (Abs): 0 10*3/uL (ref 0.0–0.1)
Immature Granulocytes: 0 %
Lymphocytes Absolute: 3 10*3/uL (ref 0.7–3.1)
Lymphs: 30 %
MCH: 30.8 pg (ref 26.6–33.0)
MCHC: 32.6 g/dL (ref 31.5–35.7)
MCV: 95 fL (ref 79–97)
Monocytes Absolute: 0.6 10*3/uL (ref 0.1–0.9)
Monocytes: 6 %
Neutrophils Absolute: 5.9 10*3/uL (ref 1.4–7.0)
Neutrophils: 60 %
Platelets: 405 10*3/uL (ref 150–450)
RBC: 5.35 x10E6/uL — ABNORMAL HIGH (ref 3.77–5.28)
RDW: 13.1 % (ref 11.7–15.4)
WBC: 9.9 10*3/uL (ref 3.4–10.8)

## 2021-12-26 LAB — LIPID PANEL
Chol/HDL Ratio: 2.7 ratio (ref 0.0–4.4)
Cholesterol, Total: 177 mg/dL (ref 100–199)
HDL: 65 mg/dL (ref >39)
LDL Chol Calc (NIH): 83 mg/dL (ref 0–99)
Triglycerides: 170 mg/dL — ABNORMAL HIGH (ref 0–149)
VLDL Cholesterol Cal: 29 mg/dL (ref 5–40)

## 2021-12-26 LAB — COMPREHENSIVE METABOLIC PANEL
ALT: 18 IU/L (ref 0–32)
AST: 17 IU/L (ref 0–40)
Albumin/Globulin Ratio: 1.9 (ref 1.2–2.2)
Albumin: 4.1 g/dL (ref 3.8–4.8)
Alkaline Phosphatase: 123 IU/L — ABNORMAL HIGH (ref 44–121)
BUN/Creatinine Ratio: 15 (ref 9–23)
BUN: 11 mg/dL (ref 6–24)
Bilirubin Total: <0.2 mg/dL (ref 0.0–1.2)
CO2: 27 mmol/L (ref 20–29)
Calcium: 9.5 mg/dL (ref 8.7–10.2)
Chloride: 103 mmol/L (ref 96–106)
Creatinine, Ser: 0.75 mg/dL (ref 0.57–1.00)
Globulin, Total: 2.2 g/dL (ref 1.5–4.5)
Glucose: 105 mg/dL — ABNORMAL HIGH (ref 70–99)
Potassium: 4 mmol/L (ref 3.5–5.2)
Sodium: 145 mmol/L — ABNORMAL HIGH (ref 134–144)
Total Protein: 6.3 g/dL (ref 6.0–8.5)
eGFR: 99 mL/min/{1.73_m2} (ref >59)

## 2021-12-26 LAB — HEMOGLOBIN A1C
Est. average glucose Bld gHb Est-mCnc: 111 mg/dL
Hgb A1c MFr Bld: 5.5 % (ref 4.8–5.6)

## 2021-12-26 LAB — TSH: TSH: 0.928 u[IU]/mL (ref 0.450–4.500)

## 2021-12-26 LAB — VITAMIN D 25 HYDROXY (VIT D DEFICIENCY, FRACTURES): Vit D, 25-Hydroxy: 124 ng/mL — ABNORMAL HIGH (ref 30.0–100.0)

## 2021-12-26 MED ORDER — MEGESTROL ACETATE 20 MG PO TABS: 20.0000 mg | ORAL_TABLET | Freq: Two times a day (BID) | ORAL | 0 refills | Status: DC

## 2021-12-29 NOTE — Telephone Encounter (Signed)
Unable to reach patient. Could not leave voicemail

## 2022-01-11 ENCOUNTER — Telehealth: Payer: Self-pay | Admitting: Physician Assistant

## 2022-01-11 ENCOUNTER — Other Ambulatory Visit: Payer: Self-pay

## 2022-01-11 DIAGNOSIS — F4323 Adjustment disorder with mixed anxiety and depressed mood: Secondary | ICD-10-CM

## 2022-01-11 MED ORDER — SERTRALINE HCL 50 MG PO TABS: 50.0000 mg | ORAL_TABLET | Freq: Every day | ORAL | 0 refills | Status: DC

## 2022-01-11 NOTE — Telephone Encounter (Signed)
Refill sent to [facility] on [address] for 90 days. Please let the patient know.

## 2022-01-11 NOTE — Telephone Encounter (Signed)
Left detailed message for patient.

## 2022-01-11 NOTE — Telephone Encounter (Signed)
Patient called stating her Zoloft was increased to 50 mg and she needs a new prescription sent in for a 90 day supply. Please advise. [phone]

## 2022-01-17 ENCOUNTER — Other Ambulatory Visit: Payer: Self-pay | Admitting: Physician Assistant

## 2022-01-17 DIAGNOSIS — R636 Underweight: Secondary | ICD-10-CM

## 2022-01-18 ENCOUNTER — Other Ambulatory Visit: Payer: Self-pay | Admitting: Physician Assistant

## 2022-01-18 DIAGNOSIS — I725 Aneurysm of other precerebral arteries: Secondary | ICD-10-CM

## 2022-01-18 DIAGNOSIS — I69319 Unspecified symptoms and signs involving cognitive functions following cerebral infarction: Secondary | ICD-10-CM

## 2022-01-18 DIAGNOSIS — M792 Neuralgia and neuritis, unspecified: Secondary | ICD-10-CM

## 2022-01-18 DIAGNOSIS — I69393 Ataxia following cerebral infarction: Secondary | ICD-10-CM

## 2022-01-22 ENCOUNTER — Other Ambulatory Visit: Payer: BC Managed Care – PPO

## 2022-01-24 ENCOUNTER — Other Ambulatory Visit: Payer: Self-pay | Admitting: Physician Assistant

## 2022-01-24 DIAGNOSIS — R636 Underweight: Secondary | ICD-10-CM

## 2022-02-01 DIAGNOSIS — E785 Hyperlipidemia, unspecified: Secondary | ICD-10-CM | POA: Insufficient documentation

## 2022-02-01 DIAGNOSIS — E7800 Pure hypercholesterolemia, unspecified: Secondary | ICD-10-CM | POA: Insufficient documentation

## 2022-02-15 ENCOUNTER — Other Ambulatory Visit: Payer: Self-pay | Admitting: Physician Assistant

## 2022-02-15 DIAGNOSIS — F4323 Adjustment disorder with mixed anxiety and depressed mood: Secondary | ICD-10-CM

## 2022-02-19 ENCOUNTER — Other Ambulatory Visit: Payer: Self-pay

## 2022-02-19 DIAGNOSIS — Z0000 Encounter for general adult medical examination without abnormal findings: Secondary | ICD-10-CM

## 2022-02-19 DIAGNOSIS — Z130 Encounter for screening for diseases of the blood and blood-forming organs and certain disorders involving the immune mechanism: Secondary | ICD-10-CM

## 2022-02-19 DIAGNOSIS — E785 Hyperlipidemia, unspecified: Secondary | ICD-10-CM

## 2022-02-19 DIAGNOSIS — E7800 Pure hypercholesterolemia, unspecified: Secondary | ICD-10-CM

## 2022-02-19 DIAGNOSIS — I10 Essential (primary) hypertension: Secondary | ICD-10-CM

## 2022-02-19 DIAGNOSIS — E559 Vitamin D deficiency, unspecified: Secondary | ICD-10-CM

## 2022-02-20 ENCOUNTER — Other Ambulatory Visit: Payer: BC Managed Care – PPO

## 2022-03-12 ENCOUNTER — Other Ambulatory Visit: Payer: BC Managed Care – PPO

## 2022-03-14 ENCOUNTER — Other Ambulatory Visit: Payer: Self-pay | Admitting: Physician Assistant

## 2022-03-14 DIAGNOSIS — E785 Hyperlipidemia, unspecified: Secondary | ICD-10-CM

## 2022-03-21 ENCOUNTER — Encounter: Payer: Self-pay | Admitting: Physician Assistant

## 2022-03-21 ENCOUNTER — Ambulatory Visit (INDEPENDENT_AMBULATORY_CARE_PROVIDER_SITE_OTHER): Payer: BC Managed Care – PPO | Admitting: Physician Assistant

## 2022-03-21 VITALS — BP 130/76 | HR 80 | Temp 98.1°F | Ht 63.0 in | Wt 88.0 lb

## 2022-03-21 DIAGNOSIS — Z0000 Encounter for general adult medical examination without abnormal findings: Secondary | ICD-10-CM

## 2022-03-21 DIAGNOSIS — I10 Essential (primary) hypertension: Secondary | ICD-10-CM | POA: Diagnosis not present

## 2022-03-21 DIAGNOSIS — R636 Underweight: Secondary | ICD-10-CM

## 2022-03-21 MED ORDER — AMLODIPINE BESYLATE 5 MG PO TABS: 5.0000 mg | ORAL_TABLET | Freq: Every day | ORAL | 1 refills | Status: DC

## 2022-03-21 NOTE — Patient Instructions (Signed)

## 2022-03-21 NOTE — Progress Notes (Signed)
? ?Complete physical exam ? ? ?Patient: [name] Barnett   DOB: 10-27-1976   47 y.o. Female  MRN: [id number] ?Visit Date: 03/21/2022 ? ? ?Chief Complaint  ?Patient presents with  ? Annual Exam  ? ?Subjective  ?  ?[name] Barnett is a 46 y.o. female who presents today for a complete physical exam.  ?She reports consuming a  high protein  diet. The patient does not participate in regular exercise at present. She generally feels ok. She does not have additional problems to discuss today.  ? ? ? ?Past Medical History:  ?Diagnosis Date  ? Duodenal obstruction   ? GERD (gastroesophageal reflux disease)   ? Hypertension   ? Stroke [facility])   ? april 2018, lt sided weakness  ? Trimalleolar fracture of ankle, closed, right, initial encounter   ? ?Past Surgical History:  ?Procedure Laterality Date  ? BALLOON DILATION  12/31/2011  ? Procedure: BALLOON DILATION;  Surgeon: Missy Sabins, MD;  Location: [facility] ENDOSCOPY;  Service: Endoscopy;  Laterality: N/A;  ? CHOLECYSTECTOMY  07/28/11  ? ESOPHAGOGASTRODUODENOSCOPY  10/17/2011  ? Procedure: ESOPHAGOGASTRODUODENOSCOPY (EGD);  Surgeon: Missy Sabins, MD;  Location: [facility] ENDOSCOPY;  Service: Endoscopy;  Laterality: N/A;  ? IR ANGIO INTRA EXTRACRAN SEL INTERNAL CAROTID BILAT MOD SED  04/07/2017  ? IR ANGIO INTRA EXTRACRAN SEL INTERNAL CAROTID BILAT MOD SED  01/07/2018  ? IR ANGIO VERTEBRAL SEL VERTEBRAL UNI R MOD SED  04/07/2017  ? IR ANGIO VERTEBRAL SEL VERTEBRAL UNI R MOD SED  01/07/2018  ? IR ANGIOGRAM FOLLOW UP STUDY  04/07/2017  ? IR ANGIOGRAM FOLLOW UP STUDY  04/07/2017  ? IR ANGIOGRAM FOLLOW UP STUDY  04/07/2017  ? IR ANGIOGRAM FOLLOW UP STUDY  04/07/2017  ? IR ANGIOGRAM FOLLOW UP STUDY  04/07/2017  ? IR ANGIOGRAM SELECTIVE EACH ADDITIONAL VESSEL  04/07/2017  ? IR TRANSCATH/EMBOLIZ  04/07/2017  ? IR US GUIDE VASC ACCESS RIGHT  01/07/2018  ? ORIF ANKLE FRACTURE Right 07/01/2018  ? Procedure: RIGHT ANKLE FRACTURE OPEN TREATMENT TRIMALLEOLAR INCLUDES INTERNAL FIXATION WITHOUT FIXATION OF POSTERIOR LIP,  FRACTURE CLOSED TREATMENT TRIMALLEOLAR ANKLE WITH MANIPULATION, REPAIR SYNDESMOSIS, REPAIR DELTOID LIGAMENT;  Surgeon: Renette Butters, MD;  Location: [place];  Service: Orthopedics;  Laterality: Right;  ? PARS PLANA VITRECTOMY Right 05/01/2017  ? Procedure: PARS PLANA VITRECTOMY WITH 25 GAUGE RIGHT EYE, endolaser photocoaglation;  Surgeon: Jalene Mullet, MD;  Location: [place];  Service: Ophthalmology;  Laterality: Right;  ? RADIOLOGY WITH ANESTHESIA N/A 04/07/2017  ? Procedure: RADIOLOGY WITH ANESTHESIA;  Surgeon: Consuella Lose, MD;  Location: [place];  Service: Radiology;  Laterality: N/A;  ? REPLACEMENT TOTAL KNEE BILATERAL  2004  ? TEMPOROMANDIBULAR JOINT SURGERY    ? 2 surgeries  ? TUMOR REMOVAL    ? VAGOTOMY  01/23/2012  ? Procedure: VAGOTOMY, antrectomy and BII;  Surgeon: Haywood Lasso, MD;  Location: [place];  Service: General;  Laterality: N/A;  Laparotomy with vagotomy.  ? ?Social History  ? ?Socioeconomic History  ? Marital status: Married  ?  Spouse name: Not on file  ? Number of children: Not on file  ? Years of education: Not on file  ? Highest education level: Not on file  ?Occupational History  ? Not on file  ?Tobacco Use  ? Smoking status: Every Day  ?  Packs/day: 1.00  ?  Years: 20.00  ?  Pack years: 20.00  ?  Types: Cigarettes  ?  Last attempt to quit: 06/24/2018  ?  Years since quitting: 3.7  ? Smokeless tobacco: Never  ?Vaping Use  ? Vaping Use: Never used  ?Substance and Sexual Activity  ? Alcohol use: Yes  ?  Comment: social  ? Drug use: No  ? Sexual activity: Not Currently  ?  Partners: Male  ?  Birth control/protection: None  ?Other Topics Concern  ? Not on file  ?Social History Narrative  ? Lives in [place] with husband and 2 kids. Works as a Psychologist, prison and probation services.  ? ?Social Determinants of Health  ? ?Financial Resource Strain: Not on file  ?Food Insecurity: Not on file  ?Transportation Needs: Not on file  ?Physical Activity: Not on file  ?Stress: Not on file  ?Social  Connections: Not on file  ?Intimate Partner Violence: Not on file  ? ? ? ?Medications: ?Outpatient Medications Prior to Visit  ?Medication Sig  ? atorvastatin (LIPITOR) 20 MG tablet TAKE 1 TABLET BY MOUTH EVERYDAY AT BEDTIME  ? HYDROcodone-acetaminophen (NORCO/VICODIN) 5-325 MG tablet Take 1 tablet by mouth every 6 (six) hours as needed.  ? megestrol (MEGACE) 20 MG tablet TAKE 1 TABLET BY MOUTH TWICE A DAY  ? sertraline (ZOLOFT) 50 MG tablet Take 1 tablet (50 mg total) by mouth daily.  ? Vitamin D, Ergocalciferol, (DRISDOL) 1.25 MG (50000 UNIT) CAPS capsule Take 1 capsule (50,000 Units total) by mouth every 7 (seven) days. **PLEASE CONTACT OUR OFFICE TO SCHEDULE A LAB VISIT FOR FUTURE MED REFILLS**  ? [DISCONTINUED] amLODipine (NORVASC) 5 MG tablet Take 1 tablet (5 mg total) by mouth daily. **PLEASE CONTACT OUR OFFICE TO SCHEDULE A FOLLOW UP FOR FUTURE MED REFILLS**  ? gabapentin (NEURONTIN) 600 MG tablet Take 1 tablet (600 mg total) by mouth 3 (three) times daily. (Patient not taking: Reported on 03/21/2022)  ? Multiple Vitamin (MULTIVITAMIN WITH MINERALS) TABS tablet Take 1 tablet by mouth daily. (Patient not taking: Reported on 03/21/2022)  ? ?No facility-administered medications prior to visit.  ? ? ?Review of Systems ?Review of Systems:  ?A fourteen system review of systems was performed and found to be positive as per HPI. ? ? ? Objective  ?  ?BP 130/76   Pulse 80   Temp 98.1 ?F (36.7 ?C)   Ht 5\' 3"  (1.6 m)   Wt 88 lb (39.9 kg)   SpO2 98%   BMI 15.59 kg/m?  ? ? ?Physical Exam  ? ?General Appearance:    Alert, cooperative, in no acute distress, appears stated age   ?Head:    Normocephalic, without obvious abnormality, atraumatic  ?Eyes:    PERRL, conjunctiva/corneas clear, EOM's intact, fundi  ?  benign, both eyes  ?Ears:    Normal TM's and external ear canals, both ears  ?Nose:   Nares normal, septum midline, mucosa normal, no drainage  ?  or sinus tenderness  ?Throat:   Lips, mucosa, and tongue normal;  teeth and gums fair  ?Neck:   Supple, symmetrical, trachea midline, no adenopathy;  ?  thyroid:  no enlargement/tenderness/nodules; no JVD  ?Back:     Symmetric, no curvature, ROM normal, no CVA tenderness  ?Lungs:     Clear to auscultation bilaterally, respirations unlabored  ?Chest Wall:    No tenderness or deformity  ? Heart:    Normal heart rate. Normal rhythm. No murmurs, rubs, or gallops.  ?  ?Breast Exam:    deferred  ?Abdomen:     Soft, non-tender, bowel sounds active all four quadrants,  ?  no masses, no organomegaly  ?Pelvic:  deferred  ?Extremities:   All extremities are intact. No cyanosis or edema. Dec strength of left upper extremity (chronic, post stroke)  ?Pulses:   2+ and symmetric all extremities  ?Skin:   Skin color, texture, turgor normal, no rashes or lesions  ?Lymph nodes:   Cervical adenopathy (submandibular, chronic), supraclavicular nodes normal  ?Neurologic:   CNII-XII grossly intact  ? ? ? ?Last depression screening scores ? ?  03/21/2022  ?  3:58 PM 12/21/2021  ?  4:15 PM 05/03/2021  ?  3:48 PM  ?PHQ 2/9 Scores  ?PHQ - 2 Score 5 1 3   ?PHQ- 9 Score 13 7 13   ? ?Last fall risk screening ? ?  03/21/2022  ?  3:57 PM  ?Fall Risk   ?Falls in the past year? 1  ?Number falls in past yr: 1  ?Injury with Fall? 1  ?Risk for fall due to : History of fall(s);Impaired balance/gait;Impaired mobility  ?Follow up Falls evaluation completed  ? ? ? ?No results found for any visits on 03/21/22. ? Assessment & Plan  ?  ?Routine Health Maintenance and Physical Exam ? ?Exercise Activities and Dietary recommendations ? ? ?Immunization History  ?Administered Date(s) Administered  ? Moderna Sars-Covid-2 Vaccination 06/28/2020, 07/26/2020  ? Tdap 11/27/2018  ? ? ?Health Maintenance  ?Topic Date Due  ? PAP SMEAR-Modifier  Never done  ? COLONOSCOPY (Pts 45-88yrs Insurance coverage will need to be confirmed)  Never done  ? COVID-19 Vaccine (3 - Moderna risk series) 08/23/2020  ? INFLUENZA VACCINE  07/10/2022  ?  TETANUS/TDAP  11/27/2028  ? Hepatitis C Screening  Completed  ? HIV Screening  Completed  ? HPV VACCINES  Aged Out  ? ? ?Discussed health benefits of physical activity, and encouraged her to engage in regular exercise appropriat

## 2022-04-08 ENCOUNTER — Other Ambulatory Visit: Payer: Self-pay | Admitting: Physician Assistant

## 2022-04-08 DIAGNOSIS — F4323 Adjustment disorder with mixed anxiety and depressed mood: Secondary | ICD-10-CM

## 2022-04-22 ENCOUNTER — Other Ambulatory Visit: Payer: Self-pay | Admitting: Physician Assistant

## 2022-04-22 DIAGNOSIS — F4323 Adjustment disorder with mixed anxiety and depressed mood: Secondary | ICD-10-CM

## 2022-04-23 ENCOUNTER — Telehealth: Payer: Self-pay | Admitting: Physician Assistant

## 2022-04-23 ENCOUNTER — Other Ambulatory Visit: Payer: Self-pay | Admitting: Physician Assistant

## 2022-04-23 DIAGNOSIS — R636 Underweight: Secondary | ICD-10-CM

## 2022-04-23 NOTE — Telephone Encounter (Signed)
Patient is aware 

## 2022-04-23 NOTE — Telephone Encounter (Signed)
Zoloft rx sent to pharmacy this morning. Patient should contact pharmacy to inquire refill. AS, CMA ?

## 2022-04-23 NOTE — Telephone Encounter (Signed)
Patient is requesting refill of her Zoloft. She only has 2 days of it left. Please advise.  ?

## 2022-07-19 ENCOUNTER — Other Ambulatory Visit: Payer: Self-pay | Admitting: Physician Assistant

## 2022-07-19 DIAGNOSIS — R636 Underweight: Secondary | ICD-10-CM

## 2022-08-14 ENCOUNTER — Telehealth: Payer: Self-pay | Admitting: Physician Assistant

## 2022-08-14 ENCOUNTER — Other Ambulatory Visit: Payer: Self-pay

## 2022-08-14 DIAGNOSIS — I69393 Ataxia following cerebral infarction: Secondary | ICD-10-CM

## 2022-08-14 DIAGNOSIS — M792 Neuralgia and neuritis, unspecified: Secondary | ICD-10-CM

## 2022-08-14 DIAGNOSIS — I69319 Unspecified symptoms and signs involving cognitive functions following cerebral infarction: Secondary | ICD-10-CM

## 2022-08-14 DIAGNOSIS — I725 Aneurysm of other precerebral arteries: Secondary | ICD-10-CM

## 2022-08-14 MED ORDER — GABAPENTIN 600 MG PO TABS: 600.0000 mg | ORAL_TABLET | Freq: Three times a day (TID) | ORAL | 1 refills | Status: DC

## 2022-08-14 NOTE — Telephone Encounter (Signed)
Pt called for a refill on her gabapentin 600 mg. Pt wants refill to be sent [id number] -  [place], [place] - [address]

## 2022-08-15 ENCOUNTER — Other Ambulatory Visit: Payer: Self-pay | Admitting: Physician Assistant

## 2022-08-15 DIAGNOSIS — I10 Essential (primary) hypertension: Secondary | ICD-10-CM

## 2022-08-15 DIAGNOSIS — F4323 Adjustment disorder with mixed anxiety and depressed mood: Secondary | ICD-10-CM

## 2022-08-15 DIAGNOSIS — E785 Hyperlipidemia, unspecified: Secondary | ICD-10-CM

## 2022-08-30 ENCOUNTER — Encounter (HOSPITAL_COMMUNITY): Payer: Self-pay | Admitting: Emergency Medicine

## 2022-08-30 ENCOUNTER — Emergency Department (HOSPITAL_COMMUNITY): Payer: BC Managed Care – PPO

## 2022-08-30 ENCOUNTER — Other Ambulatory Visit: Payer: Self-pay

## 2022-08-30 ENCOUNTER — Emergency Department (HOSPITAL_COMMUNITY)
Admission: EM | Admit: 2022-08-30 | Discharge: 2022-08-30 | Disposition: A | Discharge status: Home / Self Care | Payer: BC Managed Care – PPO | Attending: Emergency Medicine | Admitting: Emergency Medicine

## 2022-08-30 DIAGNOSIS — R55 Syncope and collapse: Secondary | ICD-10-CM | POA: Insufficient documentation

## 2022-08-30 DIAGNOSIS — R519 Headache, unspecified: Secondary | ICD-10-CM | POA: Insufficient documentation

## 2022-08-30 DIAGNOSIS — Z79899 Other long term (current) drug therapy: Secondary | ICD-10-CM | POA: Insufficient documentation

## 2022-08-30 DIAGNOSIS — I10 Essential (primary) hypertension: Secondary | ICD-10-CM | POA: Diagnosis not present

## 2022-08-30 LAB — BASIC METABOLIC PANEL
Anion gap: 8 (ref 5–15)
BUN: 14 mg/dL (ref 6–20)
CO2: 24 mmol/L (ref 22–32)
Calcium: 7.9 mg/dL — ABNORMAL LOW (ref 8.9–10.3)
Chloride: 103 mmol/L (ref 98–111)
Creatinine, Ser: 1.14 mg/dL — ABNORMAL HIGH (ref 0.44–1.00)
GFR, Estimated: 60 mL/min — ABNORMAL LOW (ref >60)
Glucose, Bld: 100 mg/dL — ABNORMAL HIGH (ref 70–99)
Potassium: 3.7 mmol/L (ref 3.5–5.1)
Sodium: 135 mmol/L (ref 135–145)

## 2022-08-30 LAB — CBC WITH DIFFERENTIAL/PLATELET
Abs Immature Granulocytes: 0.05 10*3/uL (ref 0.00–0.07)
Basophils Absolute: 0.1 10*3/uL (ref 0.0–0.1)
Basophils Relative: 0 %
Eosinophils Absolute: 0.2 10*3/uL (ref 0.0–0.5)
Eosinophils Relative: 1 %
HCT: 41.7 % (ref 36.0–46.0)
Hemoglobin: 13.7 g/dL (ref 12.0–15.0)
Immature Granulocytes: 0 %
Lymphocytes Relative: 18 %
Lymphs Abs: 2.4 10*3/uL (ref 0.7–4.0)
MCH: 32.7 pg (ref 26.0–34.0)
MCHC: 32.9 g/dL (ref 30.0–36.0)
MCV: 99.5 fL (ref 80.0–100.0)
Monocytes Absolute: 0.8 10*3/uL (ref 0.1–1.0)
Monocytes Relative: 6 %
Neutro Abs: 10.2 10*3/uL — ABNORMAL HIGH (ref 1.7–7.7)
Neutrophils Relative %: 75 %
Platelets: 248 10*3/uL (ref 150–400)
RBC: 4.19 MIL/uL (ref 3.87–5.11)
RDW: 14.6 % (ref 11.5–15.5)
WBC: 13.7 10*3/uL — ABNORMAL HIGH (ref 4.0–10.5)
nRBC: 0 % (ref 0.0–0.2)

## 2022-08-30 LAB — I-STAT BETA HCG BLOOD, ED (MC, WL, AP ONLY): I-stat hCG, quantitative: <5 m[IU]/mL (ref <5)

## 2022-08-30 LAB — CBG MONITORING, ED: Glucose-Capillary: 100 mg/dL — ABNORMAL HIGH (ref 70–99)

## 2022-08-30 MED ORDER — LACTATED RINGERS IV BOLUS
1000.0000 mL | Freq: Once | INTRAVENOUS | Status: AC
  Administered 2022-08-30: 1000 mL via INTRAVENOUS

## 2022-08-30 MED ORDER — SODIUM CHLORIDE 0.9 % IV BOLUS
1000.0000 mL | Freq: Once | INTRAVENOUS | Status: AC
  Administered 2022-08-30: 1000 mL via INTRAVENOUS

## 2022-08-30 NOTE — ED Provider Notes (Signed)
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 Loss of Consciousness    [name] Barnett is a 46 y.o. female. Presents with a chief complaint of syncope.  Per family report she had been sitting on the couch when they felt that she looked like she was sleeping.  When her daughter came home she found the patient lying on the couch unresponsive.  When EMS came they found that she was hypotensive down to 70/40.  She is given 800 cc of fluid in route and has perked up.  Per patient she does not feel like she had any symptoms prior to the syncopal episode.  She does not remember having any pain.  She says this is never happened before.  He does state that she has a headache but this is not any change from her baseline headache. She reports feeling completely normal.  She reports no change in home BP medications. Reports decreased fluid intake. No recent strenuous activity. Not on any steroids.   She denies any change in her chronic left-sided weakness, no numbness, dizziness, speech changes, chest pain, shortness of breath, abdominal pain, nausea, vomiting, fever, chills, cough.  Loss of Consciousness Associated symptoms: headaches        Home Medications Prior to Admission medications   Medication Sig Start Date End Date Taking? Authorizing Provider  amLODipine (NORVASC) 5 MG tablet TAKE 1 TABLET (5 MG TOTAL) BY MOUTH DAILY. 08/16/22   Mayer Masker, PA-C  atorvastatin (LIPITOR) 20 MG tablet TAKE 1 TABLET BY MOUTH EVERYDAY AT BEDTIME 08/16/22   Abonza, Maritza, PA-C  gabapentin (NEURONTIN) 600 MG tablet Take 1 tablet (600 mg total) by mouth 3 (three) times daily. 08/14/22   Carlean Jews, NP  HYDROcodone-acetaminophen (NORCO/VICODIN) 5-325 MG tablet Take 1 tablet by mouth every 6 (six) hours as needed.  03/15/22   [provider]  megestrol (MEGACE) 20 MG tablet TAKE 1 TABLET BY MOUTH TWICE A DAY 07/19/22   Abonza, Maritza, PA-C  Multiple Vitamin (MULTIVITAMIN WITH MINERALS) TABS tablet Take 1 tablet by mouth daily. Patient not taking: Reported on 03/21/2022    [provider]  sertraline (ZOLOFT) 50 MG tablet TAKE 1 TABLET BY MOUTH EVERY DAY 08/16/22   Abonza, Maritza, PA-C  Vitamin D, Ergocalciferol, (DRISDOL) 1.25 MG (50000 UNIT) CAPS capsule Take 1 capsule (50,000 Units total) by mouth every 7 (seven) days. **PLEASE CONTACT OUR OFFICE TO SCHEDULE A LAB VISIT FOR FUTURE MED REFILLS** 09/08/21   Mayer Masker, PA-C      Allergies    Nsaids    Review of Systems   Review of Systems  Cardiovascular:  Positive for syncope.  Neurological:  Positive for syncope and headaches.  All other systems reviewed and are negative.   Physical Exam Updated Vital Signs BP 99/61   Pulse 70   Temp 97.7 F (36.5 C) (Oral)   Resp 15   Ht 5\' 3"  (1.6 m)   Wt 42.6 kg   LMP  (LMP Unknown)   SpO2 94%   BMI 16.65 kg/m  Physical Exam Vitals and nursing note reviewed.  Constitutional:      General: She is not in acute distress.    Appearance: Normal appearance. She is not ill-appearing, toxic-appearing or diaphoretic.  HENT:     Head: Normocephalic and  atraumatic.     Nose: No nasal deformity, congestion or rhinorrhea.     Mouth/Throat:     Lips: Pink. No lesions.     Mouth: Mucous membranes are moist. No injury, lacerations, oral lesions or angioedema.     Pharynx: Oropharynx is clear. Uvula midline. No pharyngeal swelling, oropharyngeal exudate, posterior oropharyngeal erythema or uvula swelling.  Eyes:     General: Gaze aligned appropriately. No scleral icterus.       Right eye: No discharge.        Left eye: No discharge.     Extraocular Movements: Extraocular movements intact.     Conjunctiva/sclera: Conjunctivae normal.     Right eye: Right conjunctiva is not injected. No  exudate or hemorrhage.    Left eye: Left conjunctiva is not injected. No exudate or hemorrhage.    Pupils: Pupils are equal, round, and reactive to light.     Comments: L pupil > R pupil, both equal round and reactive  Cardiovascular:     Rate and Rhythm: Normal rate and regular rhythm.     Pulses: Normal pulses.          Radial pulses are 2+ on the right side and 2+ on the left side.       Dorsalis pedis pulses are 2+ on the right side and 2+ on the left side.     Heart sounds: Normal heart sounds, S1 normal and S2 normal. Heart sounds not distant. No murmur heard.    No friction rub. No gallop. No S3 or S4 sounds.  Pulmonary:     Effort: Pulmonary effort is normal. No accessory muscle usage or respiratory distress.     Breath sounds: Normal breath sounds. No stridor. No wheezing, rhonchi or rales.  Chest:     Chest wall: No tenderness.  Abdominal:     General: Abdomen is flat. There is no distension.     Palpations: Abdomen is soft. There is no mass or pulsatile mass.     Tenderness: There is no abdominal tenderness. There is no guarding or rebound.  Musculoskeletal:     Right lower leg: No edema.     Left lower leg: No edema.  Skin:    General: Skin is warm and dry.     Coloration: Skin is not jaundiced or pale.     Findings: No bruising, erythema, lesion or rash.  Neurological:     General: No focal deficit present.     Mental Status: She is alert and oriented to person, place, and time. Mental status is at baseline.     GCS: GCS eye subscore is 4. GCS verbal subscore is 5. GCS motor subscore is 6.     Comments: 5/5 strength in all ext. I can barely detect her baseline weakness on the left side. Sensory intact. UTA gait due to baseline ambulatory dysfunction  Psychiatric:        Mood and Affect: Mood normal.        Behavior: Behavior normal. Behavior is cooperative.     ED Results / Procedures / Treatments   Labs (all labs ordered are listed, but only abnormal results  are displayed) Labs Reviewed  BASIC METABOLIC PANEL - Abnormal; Notable for the following components:      Result Value   Glucose, Bld 100 (*)    Creatinine, Ser 1.14 (*)    Calcium 7.9 (*)    GFR, Estimated 60 (*)    All other components within normal limits  CBC WITH DIFFERENTIAL/PLATELET - Abnormal; Notable for the following components:   WBC 13.7 (*)    Neutro Abs 10.2 (*)    All other components within normal limits  CBG MONITORING, ED - Abnormal; Notable for the following components:   Glucose-Capillary 100 (*)    All other components within normal limits  I-STAT BETA HCG BLOOD, ED ([facility], [facility], [facility] ONLY)    EKG None  Radiology CT HEAD WO CONTRAST  Result Date: 08/30/2022 CLINICAL DATA:  Syncopal episode. EXAM: CT HEAD WITHOUT CONTRAST TECHNIQUE: Contiguous axial images were obtained from the base of the skull through the vertex without intravenous contrast. RADIATION DOSE REDUCTION: This exam was performed according to the departmental dose-optimization program which includes automated exposure control, adjustment of the mA and/or kV according to patient size and/or use of iterative reconstruction technique. COMPARISON:  September 05, 2017 FINDINGS: Brain: No evidence of acute infarction, hemorrhage, hydrocephalus, extra-axial collection or mass lesion/mass effect. A chronic left superior cerebellar artery territory infarct is seen. A right frontal ventriculostomy catheter tract is also noted. Vascular: Metallic density vascular coils are seen within the tip of the basilar artery on the right. An extensive amount of associated streak artifact is noted. Skull: A chronic right frontal burr hole is present. No acute osseous abnormalities are identified. Sinuses/Orbits: There is mild right ethmoid sinus mucosal thickening. Small, incompletely imaged left maxillary sinus polyp versus mucous retention cyst is present. Other: None. IMPRESSION: 1. No acute intracranial abnormality. 2. 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 3. Prior basilar artery aneurysm embolization. Electronically Signed   By: Aram Candelahaddeus  Houston M.D.   On: 08/30/2022 02:34    Procedures Procedures  This patient was on telemetry or cardiac monitoring during their time in the ED.    Medications Ordered in ED Medications  sodium chloride 0.9 % bolus 1,000 mL (1,000 mLs Intravenous New Bag/Given 08/30/22 0202)  lactated ringers bolus 1,000 mL (1,000 mLs Intravenous New Bag/Given 08/30/22 0311)    ED Course/ Medical Decision Making/ A&P                            Medical Decision Making Amount and/or Complexity of Data Reviewed Labs: ordered. Radiology: ordered. ECG/medicine tests: ordered.    MDM  This is a 46 y.o. female who presents to the ED with syncope The differential of this patient includes but is not limited to Anemia, Hypoglycemia, Cardiac arrhythmia, Valvular disorder, SAH, Orthostatic hypotension, Vasovagal  Initial Impression  Patient is well-appearing and in no acute distress. Her initial blood pressure here is 92/59 which improved to 100/59 after fluids. HDS otherwise. No new neuro deficits on exam. With concern for prior SAH, will obtain CT head however suspicion is low.  She seems clinically dry on exam so will give an addition L if IVF.  No infectious symptoms to suggest sepsis. Doubt seizure without postictal period. Patient back to baseline. Will obtain EKG and basic labs.  I personally ordered, reviewed, and interpreted all laboratory work and imaging and agree with radiologist interpretation. Results interpreted below: WBC 13.7 which is nonspecific, Creat 1.14, other electrolytes okay CT head with stable findings EKG reviewed and nonischemic, not in heart block, no evidence of WPW, QT slightly prolonged, No brugada criteria, No LVH  Assessment/Plan:  Patient given 2 L of IVF with improvement in Bps. She has been monitored for over 4 hours and has not shown signs of  arrhythmia. She has had no  return of symptoms. I have low suspicion for cardiac cause. With mild AKI she is likely a little dehydrated as all she has had to day is soda. I do not feel that she needs to be admitted. I have recommended close PCP follow up. I recommend holding Amlodipine until she is seen by PCP   Charting Requirements Additional history is obtained from:  Independent historian External Records from outside source obtained and reviewed including: PCP note from April 2023 Social Determinants of Health:  none Pertinant PMH that complicates patient's illness: Hx of SAH, HTN  Patient Care Problems that were addressed during this visit: - Syncope: Acute illness with systemic symptoms This patient was maintained on a cardiac monitor/telemetry. I personally viewed and interpreted the cardiac monitor which reveals an underlying rhythm of NSR Medications given in ED: 2 L IVF Reevaluation of the patient after these medicines showed that the patient resolved I have reviewed home medications and made changes accordingly.  Critical Care Interventions: n/a Consultations: n/a Disposition: discharge  Portions of this note were generated with Dragon dictation software. Dictation errors may occur despite best attempts at proofreading.  Final Clinical Impression(s) / ED Diagnoses Final diagnoses:  Syncope, unspecified syncope type    Rx / DC Orders ED Discharge Orders     None         Adolphus Birchwood, PA-C 08/30/22 0442    Mesner, Corene Cornea, MD 08/30/22 [phone]

## 2022-08-30 NOTE — Discharge Instructions (Addendum)
Your labs and head CT returned normal. Your blood pressure has remained normal for several hours while being here. You have been given 2 Liters of IV fluids.   You should schedule a follow up visit with your PCP. Please drink plenty of fluids while you are home. Please stop taking Amlodipine until you are evaluated by your PCP.  Please return if you develop chest pain, shortness of breath, confusion, or worsening symtpoms

## 2022-08-30 NOTE — ED Notes (Signed)
Pt unable to sign for MSE, verbalized understanding  

## 2022-08-30 NOTE — ED Triage Notes (Signed)
Pt BIB [facility] from home with spouse c/o syncopal episode, upon arrival BP 70/40, hx of stroke and brain aneurysm; left-sided weakness and speech at baseline per family, pt a&o x 4; 862ml NS given en route

## 2022-09-03 ENCOUNTER — Telehealth: Payer: Self-pay

## 2022-09-03 NOTE — Telephone Encounter (Signed)
Called pt she is advised to make a F/U visit her appt. will be 09/04/2022

## 2022-09-03 NOTE — Telephone Encounter (Signed)
Patient called office to inform that her blood pressure bottomed out, patient has taken a half of her blood pressure medication Thursday, Friday and Saturday and took whole one yesterday, patient would like to receive a call back, please advise, thanks!

## 2022-09-04 ENCOUNTER — Ambulatory Visit: Payer: BC Managed Care – PPO | Admitting: Physician Assistant

## 2022-09-04 ENCOUNTER — Encounter: Payer: Self-pay | Admitting: Physician Assistant

## 2022-09-04 VITALS — BP 148/92 | HR 89 | Temp 97.6°F | Ht 64.0 in | Wt 86.0 lb

## 2022-09-04 DIAGNOSIS — I10 Essential (primary) hypertension: Secondary | ICD-10-CM

## 2022-09-04 DIAGNOSIS — E46 Unspecified protein-calorie malnutrition: Secondary | ICD-10-CM

## 2022-09-04 MED ORDER — AMLODIPINE BESYLATE 2.5 MG PO TABS: 2.5000 mg | ORAL_TABLET | Freq: Every day | ORAL | 0 refills | Status: DC

## 2022-09-04 NOTE — Assessment & Plan Note (Signed)
-  Reviewed recent ED notes, imaging and labs. Patient's BP elevated in office. Will resume amlodipine at a lower dose of 2.5 mg once daily. Advised to purchase a BP device and check BP at home, at least 3 times weekly. Discussed BP goal is <130/80 and to notify the office if BP consistently above goal or <100/60 for medication adjustments. Discussed poor nutritional/fluid intake also likely contributing to hypotension. Patient will return for lab visit to repeat CMP for electrolytes and kidney function.

## 2022-09-04 NOTE — Assessment & Plan Note (Addendum)
-  Chronic. Patient has tried and failed Paxil and Mirtazapine. Previously declined nutritionist referral. Discussed improving protein intake and incorporating small meals.

## 2022-09-04 NOTE — Patient Instructions (Signed)
Hypertension, Adult ?Hypertension is another name for high blood pressure. High blood pressure forces your heart to work harder to pump blood. This can cause problems over time. ?There are two numbers in a blood pressure reading. There is a top number (systolic) over a bottom number (diastolic). It is best to have a blood pressure that is below 120/80. ?What are the causes? ?The cause of this condition is not known. Some other conditions can lead to high blood pressure. ?What increases the risk? ?Some lifestyle factors can make you more likely to develop high blood pressure: ?Smoking. ?Not getting enough exercise or physical activity. ?Being overweight. ?Having too much fat, sugar, calories, or salt (sodium) in your diet. ?Drinking too much alcohol. ?Other risk factors include: ?Having any of these conditions: ?Heart disease. ?Diabetes. ?High cholesterol. ?Kidney disease. ?Obstructive sleep apnea. ?Having a family history of high blood pressure and high cholesterol. ?Age. The risk increases with age. ?Stress. ?What are the signs or symptoms? ?High blood pressure may not cause symptoms. Very high blood pressure (hypertensive crisis) may cause: ?Headache. ?Fast or uneven heartbeats (palpitations). ?Shortness of breath. ?Nosebleed. ?Vomiting or feeling like you may vomit (nauseous). ?Changes in how you see. ?Very bad chest pain. ?Feeling dizzy. ?Seizures. ?How is this treated? ?This condition is treated by making healthy lifestyle changes, such as: ?Eating healthy foods. ?Exercising more. ?Drinking less alcohol. ?Your doctor may prescribe medicine if lifestyle changes do not help enough and if: ?Your top number is above 130. ?Your bottom number is above 80. ?Your personal target blood pressure may vary. ?Follow these instructions at home: ?Eating and drinking ? ?If told, follow the DASH eating plan. To follow this plan: ?Fill one half of your plate at each meal with fruits and vegetables. ?Fill one fourth of your plate  at each meal with whole grains. Whole grains include whole-wheat pasta, brown rice, and whole-grain bread. ?Eat or drink low-fat dairy products, such as skim milk or low-fat yogurt. ?Fill one fourth of your plate at each meal with low-fat (lean) proteins. Low-fat proteins include fish, chicken without skin, eggs, beans, and tofu. ?Avoid fatty meat, cured and processed meat, or chicken with skin. ?Avoid pre-made or processed food. ?Limit the amount of salt in your diet to less than 1,500 mg each day. ?Do not drink alcohol if: ?Your doctor tells you not to drink. ?You are pregnant, may be pregnant, or are planning to become pregnant. ?If you drink alcohol: ?Limit how much you have to: ?0-1 drink a day for women. ?0-2 drinks a day for men. ?Know how much alcohol is in your drink. In the [place] one drink equals one 12 oz bottle of beer (355 mL), one 5 oz glass of wine (148 mL), or one 1? oz glass of hard liquor (44 mL). ?Lifestyle ? ?Work with your doctor to stay at a healthy weight or to lose weight. Ask your doctor what the best weight is for you. ?Get at least 30 minutes of exercise that causes your heart to beat faster (aerobic exercise) most days of the week. This may include walking, swimming, or biking. ?Get at least 30 minutes of exercise that strengthens your muscles (resistance exercise) at least 3 days a week. This may include lifting weights or doing Pilates. ?Do not smoke or use any products that contain nicotine or tobacco. If you need help quitting, ask your doctor. ?Check your blood pressure at home as told by your doctor. ?Keep all follow-up visits. ?Medicines ?Take over-the-counter and prescription medicines   only as told by your doctor. Follow directions carefully. ?Do not skip doses of blood pressure medicine. The medicine does not work as well if you skip doses. Skipping doses also puts you at risk for problems. ?Ask your doctor about side effects or reactions to medicines that you should watch  for. ?Contact a doctor if: ?You think you are having a reaction to the medicine you are taking. ?You have headaches that keep coming back. ?You feel dizzy. ?You have swelling in your ankles. ?You have trouble with your vision. ?Get help right away if: ?You get a very bad headache. ?You start to feel mixed up (confused). ?You feel weak or numb. ?You feel faint. ?You have very bad pain in your: ?Chest. ?Belly (abdomen). ?You vomit more than once. ?You have trouble breathing. ?These symptoms may be an emergency. Get help right away. Call 911. ?Do not wait to see if the symptoms will go away. ?Do not drive yourself to the hospital. ?Summary ?Hypertension is another name for high blood pressure. ?High blood pressure forces your heart to work harder to pump blood. ?For most people, a normal blood pressure is less than 120/80. ?Making healthy choices can help lower blood pressure. If your blood pressure does not get lower with healthy choices, you may need to take medicine. ?This information is not intended to replace advice given to you by your health care provider. Make sure you discuss any questions you have with your health care provider. ?Document Revised: 09/14/2021 Document Reviewed: 09/14/2021 ?Elsevier Patient Education ? 2023 [employer] ? ?

## 2022-09-04 NOTE — Progress Notes (Signed)
Established patient visit   Patient: [name] Barnett   DOB: 1976-11-23   46 y.o. Female  MRN: [id number] Visit Date: 09/04/2022  Chief Complaint  Patient presents with   Follow-up   Subjective    HPI  Patient presents for ED follow-up. Patient reports took a half dose of amlodipine 5 mg last Friday and Saturday. Has not taken any blood pressure medication today or yesterday. Has not checked BP at home. Patient reports her father passed away 05/08/2022 and has been taking care of her mother which has been challenging. No re-occurrence of syncope. Patient continues to struggle appetite and food intake.   ED HPI: [name] Barnett is a 46 y.o. female. Presents with a chief complaint of syncope.  Per family report she had been sitting on the couch when they felt that she looked like she was sleeping.  When her daughter came home she found the patient lying on the couch unresponsive.  When EMS came they found that she was hypotensive down to 70/40.  She is given 800 cc of fluid in route and has perked up.  Per patient she does not feel like she had any symptoms prior to the syncopal episode.  She does not remember having any pain.  She says this is never happened before.  He does state that she has a headache but this is not any change from her baseline headache. She reports feeling completely normal.   She reports no change in home BP medications. Reports decreased fluid intake. No recent strenuous activity. Not on any steroids.     Medications: Outpatient Medications Prior to Visit  Medication Sig   atorvastatin (LIPITOR) 20 MG tablet TAKE 1 TABLET BY MOUTH EVERYDAY AT BEDTIME   gabapentin (NEURONTIN) 600 MG tablet Take 1 tablet (600 mg total) by mouth 3 (three) times daily.   HYDROcodone-acetaminophen (NORCO/VICODIN) 5-325 MG tablet Take 1 tablet by mouth every 6 (six) hours as needed.   megestrol (MEGACE) 20 MG tablet TAKE 1 TABLET BY MOUTH TWICE A DAY   Multiple Vitamin (MULTIVITAMIN WITH  MINERALS) TABS tablet Take 1 tablet by mouth daily. (Patient not taking: Reported on 03/21/2022)   sertraline (ZOLOFT) 50 MG tablet TAKE 1 TABLET BY MOUTH EVERY DAY   Vitamin D, Ergocalciferol, (DRISDOL) 1.25 MG (50000 UNIT) CAPS capsule Take 1 capsule (50,000 Units total) by mouth every 7 (seven) days. **PLEASE CONTACT OUR OFFICE TO SCHEDULE A LAB VISIT FOR FUTURE MED REFILLS**   [DISCONTINUED] amLODipine (NORVASC) 5 MG tablet TAKE 1 TABLET (5 MG TOTAL) BY MOUTH DAILY.   No facility-administered medications prior to visit.    Review of Systems Review of Systems:  A fourteen system review of systems was performed and found to be positive as per HPI.     Objective    BP (!) 148/92   Pulse 89   Temp 97.6 F (36.4 C) (Temporal)   Ht 5' 4" (1.626 m)   Wt 86 lb (39 kg)   LMP  (LMP Unknown)   BMI 14.76 kg/m  BP Readings from Last 3 Encounters:  09/04/22 (!) 148/92  08/30/22 107/63  03/21/22 130/76   Wt Readings from Last 3 Encounters:  09/04/22 86 lb (39 kg)  08/30/22 94 lb (42.6 kg)  03/21/22 88 lb (39.9 kg)    Physical Exam  General:  Cooperative, in no acute distress, appropriate for stated age.  Neuro:  Alert and oriented,  extra-ocular muscles intact  HEENT:  Normocephalic, atraumatic, neck supple  Skin:  no gross rash, warm, pink. Cardiac:  RRR, S1 S2 Respiratory: CTA B/L  Vascular:  Ext warm, no cyanosis apprec.; cap RF less 2 sec. Psych:  No HI/SI, judgement and insight good, Euthymic mood. Full Affect.   No results found for any visits on 09/04/22.  Assessment & Plan      Problem List Items Addressed This Visit       Cardiovascular and Mediastinum   HTN, goal below 130/80 - Primary    -Reviewed recent ED notes, imaging and labs. Patient's BP elevated in office. Will resume amlodipine at a lower dose of 2.5 mg once daily. Advised to purchase a BP device and check BP at home, at least 3 times weekly. Discussed BP goal is <130/80 and to notify the office if BP  consistently above goal or <100/60 for medication adjustments. Discussed poor nutritional/fluid intake also likely contributing to hypotension. Patient will return for lab visit to repeat CMP for electrolytes and kidney function.      Relevant Medications   amLODipine (NORVASC) 2.5 MG tablet   Other Relevant Orders   Comp Met (CMET)     Other   Protein-calorie malnutrition ([place])    -Chronic. Patient has tried and failed Paxil and Mirtazapine. Previously declined nutritionist referral. Discussed improving protein intake and incorporating small meals.        Return in about 3 months (around 12/04/2022) for HTN, Mood; lab visit this week for CMP.        Lorrene Reid, PA-C  [facility] at [facility] [phone] (phone) [phone] (fax)  [place]

## 2022-09-05 ENCOUNTER — Other Ambulatory Visit: Payer: BC Managed Care – PPO

## 2022-09-05 DIAGNOSIS — I10 Essential (primary) hypertension: Secondary | ICD-10-CM

## 2022-09-06 LAB — COMPREHENSIVE METABOLIC PANEL
ALT: 16 IU/L (ref 0–32)
AST: 19 IU/L (ref 0–40)
Albumin/Globulin Ratio: 1.8 (ref 1.2–2.2)
Albumin: 4.2 g/dL (ref 3.9–4.9)
Alkaline Phosphatase: 148 IU/L — ABNORMAL HIGH (ref 44–121)
BUN/Creatinine Ratio: 13 (ref 9–23)
BUN: 12 mg/dL (ref 6–24)
Bilirubin Total: <0.2 mg/dL (ref 0.0–1.2)
CO2: 24 mmol/L (ref 20–29)
Calcium: 9.5 mg/dL (ref 8.7–10.2)
Chloride: 106 mmol/L (ref 96–106)
Creatinine, Ser: 0.95 mg/dL (ref 0.57–1.00)
Globulin, Total: 2.4 g/dL (ref 1.5–4.5)
Glucose: 92 mg/dL (ref 70–99)
Potassium: 4 mmol/L (ref 3.5–5.2)
Sodium: 145 mmol/L — ABNORMAL HIGH (ref 134–144)
Total Protein: 6.6 g/dL (ref 6.0–8.5)
eGFR: 74 mL/min/{1.73_m2} (ref >59)

## 2022-09-20 ENCOUNTER — Ambulatory Visit: Payer: BC Managed Care – PPO | Admitting: Physician Assistant

## 2022-12-03 NOTE — Progress Notes (Signed)
Established patient visit   Patient: [name] Barnett   DOB: 31-Jul-1976   46 y.o. Female  MRN: [id number] Visit Date: 12/04/2022   Chief Complaint  Patient presents with   Follow-up   Hypertension   Subjective    HPI  Follow up  -hypertension --on amlodipine 2.5 mg daily  --blood pressure slightly elevated today. She states this is normal at onset of visit. If rechecked at the end of the visit, it will be improved.  -mood --currently on sertraline 50 mg daily.  --states that she is doing well on this dose of medication  -low BMI --stable with weight gain of 7 pounds since last visit  --has declined nutritionist referral in the past.   -she does need refills for routine medication   Medications: Outpatient Medications Prior to Visit  Medication Sig   gabapentin (NEURONTIN) 600 MG tablet TAKE 1 TABLET BY MOUTH THREE TIMES A DAY   HYDROcodone-acetaminophen (NORCO/VICODIN) 5-325 MG tablet Take 1 tablet by mouth every 6 (six) hours as needed.   Multiple Vitamin (MULTIVITAMIN WITH MINERALS) TABS tablet Take 1 tablet by mouth daily.   Vitamin D, Ergocalciferol, (DRISDOL) 1.25 MG (50000 UNIT) CAPS capsule Take 1 capsule (50,000 Units total) by mouth every 7 (seven) days. **PLEASE CONTACT OUR OFFICE TO SCHEDULE A LAB VISIT FOR FUTURE MED REFILLS**   [DISCONTINUED] amLODipine (NORVASC) 2.5 MG tablet Take 1 tablet (2.5 mg total) by mouth daily.   [DISCONTINUED] atorvastatin (LIPITOR) 20 MG tablet TAKE 1 TABLET BY MOUTH EVERYDAY AT BEDTIME   [DISCONTINUED] megestrol (MEGACE) 20 MG tablet TAKE 1 TABLET BY MOUTH TWICE A DAY   [DISCONTINUED] sertraline (ZOLOFT) 50 MG tablet TAKE 1 TABLET BY MOUTH EVERY DAY   No facility-administered medications prior to visit.    Review of Systems  Constitutional:  Negative for activity change, appetite change, chills, fatigue and fever.       Weight gain  7 pounds   HENT:  Negative for congestion, postnasal drip, rhinorrhea, sinus pressure, sinus pain,  sneezing and sore throat.   Eyes: Negative.   Respiratory:  Negative for cough, chest tightness, shortness of breath and wheezing.   Cardiovascular:  Negative for chest pain and palpitations.  Gastrointestinal:  Negative for abdominal pain, constipation, diarrhea, nausea and vomiting.  Endocrine: Negative for cold intolerance, heat intolerance, polydipsia and polyuria.  Genitourinary:  Negative for dyspareunia, dysuria, flank pain, frequency and urgency.  Musculoskeletal:  Negative for arthralgias, back pain and myalgias.  Skin:  Negative for rash.  Allergic/Immunologic: Negative for environmental allergies.  Neurological:  Negative for dizziness, weakness and headaches.  Hematological:  Negative for adenopathy.  Psychiatric/Behavioral:  The patient is not nervous/anxious.     Last CBC Lab Results  Component Value Date   WBC 13.7 (H) 08/30/2022   HGB 13.7 08/30/2022   HCT 41.7 08/30/2022   MCV 99.5 08/30/2022   MCH 32.7 08/30/2022   RDW 14.6 08/30/2022   PLT 248 08/30/2022   Last metabolic panel Lab Results  Component Value Date   GLUCOSE 92 09/05/2022   NA 145 (H) 09/05/2022   K 4.0 09/05/2022   CL 106 09/05/2022   CO2 24 09/05/2022   BUN 12 09/05/2022   CREATININE 0.95 09/05/2022   EGFR 74 09/05/2022   CALCIUM 9.5 09/05/2022   PHOS 3.7 02/14/2018   PROT 6.6 09/05/2022   ALBUMIN 4.2 09/05/2022   LABGLOB 2.4 09/05/2022   AGRATIO 1.8 09/05/2022   BILITOT <0.2 09/05/2022   ALKPHOS 148 (H) 09/05/2022  AST 19 09/05/2022   ALT 16 09/05/2022   ANIONGAP 8 08/30/2022   Last lipids Lab Results  Component Value Date   CHOL 177 12/25/2021   HDL 65 12/25/2021   LDLCALC 83 12/25/2021   TRIG 170 (H) 12/25/2021   CHOLHDL 2.7 12/25/2021   Last hemoglobin A1c Lab Results  Component Value Date   HGBA1C 5.5 12/25/2021   Last thyroid functions Lab Results  Component Value Date   TSH 0.928 12/25/2021   Last vitamin D Lab Results  Component Value Date   VD25OH 124.0  (H) 12/25/2021   Last vitamin B12 and Folate Lab Results  Component Value Date   VITAMINB12 1,161 03/23/2019   FOLATE 15.1 03/23/2019       Objective     Today's Vitals   12/04/22 1406 12/04/22 1450  BP: (Abnormal) 151/84 (Abnormal) 131/97  Pulse: 69 72  Resp: 18   SpO2: 97%   Weight: 93 lb (42.2 kg)   Height: 5\' 4"  (1.626 m)    Body mass index is 15.96 kg/m.  BP Readings from Last 3 Encounters:  12/04/22 (Abnormal) 131/97  09/04/22 (Abnormal) 148/92  08/30/22 107/63    Wt Readings from Last 3 Encounters:  12/04/22 93 lb (42.2 kg)  09/04/22 86 lb (39 kg)  08/30/22 94 lb (42.6 kg)    Physical Exam Vitals and nursing note reviewed.  Constitutional:      Appearance: Normal appearance. She is well-developed.  HENT:     Head: Normocephalic and atraumatic.     Nose: Nose normal.     Mouth/Throat:     Mouth: Mucous membranes are moist.     Pharynx: Oropharynx is clear.  Eyes:     Extraocular Movements: Extraocular movements intact.     Conjunctiva/sclera: Conjunctivae normal.     Pupils: Pupils are equal, round, and reactive to light.  Cardiovascular:     Rate and Rhythm: Normal rate and regular rhythm.     Pulses: Normal pulses.     Heart sounds: Normal heart sounds.  Pulmonary:     Effort: Pulmonary effort is normal.     Breath sounds: Normal breath sounds.  Abdominal:     Palpations: Abdomen is soft.  Musculoskeletal:        General: Normal range of motion.     Cervical back: Normal range of motion and neck supple.  Lymphadenopathy:     Cervical: No cervical adenopathy.  Skin:    General: Skin is warm and dry.     Capillary Refill: Capillary refill takes less than 2 seconds.  Neurological:     General: No focal deficit present.     Mental Status: She is alert and oriented to person, place, and time.  Psychiatric:        Mood and Affect: Mood normal.        Behavior: Behavior normal.        Thought Content: Thought content normal.        Judgment:  Judgment normal.       Assessment & Plan    1. HTN, goal below 130/80 Generally stable. Continue norvasc 2.5 mg daily. Follow DASH diet. Monitor at home..  - amLODipine (NORVASC) 2.5 MG tablet; Take 1 tablet (2.5 mg total) by mouth daily.  Dispense: 90 tablet; Refill: 1  2. Hyperlipidemia, unspecified hyperlipidemia type Continue lipitor as prescrfibed  - atorvastatin (LIPITOR) 20 MG tablet; Take 1 tablet po QPM  Dispense: 90 tablet; Refill: 1  3. Underweight due to inadequate  caloric intake Improved, with weight gain 7 pounds since last visit. May continue Megace twice daily. New prescription sent to pharmacy today.  - megestrol (MEGACE) 20 MG tablet; Take 1 tablet (20 mg total) by mouth 2 (two) times daily.  Dispense: 180 tablet; Refill: 1  4. Adjustment disorder with mixed anxiety and depressed mood Stable. Continue sertraline as prescribed  - sertraline (ZOLOFT) 50 MG tablet; Take 1 tablet (50 mg total) by mouth daily.  Dispense: 90 tablet; Refill: 1    Problem List Items Addressed This Visit       Cardiovascular and Mediastinum   HTN, goal below 130/80   Relevant Medications   amLODipine (NORVASC) 2.5 MG tablet   atorvastatin (LIPITOR) 20 MG tablet     Other   Adjustment disorder with mixed anxiety and depressed mood   Relevant Medications   sertraline (ZOLOFT) 50 MG tablet   Hyperlipidemia   Relevant Medications   amLODipine (NORVASC) 2.5 MG tablet   atorvastatin (LIPITOR) 20 MG tablet   Underweight due to inadequate caloric intake   Relevant Medications   megestrol (MEGACE) 20 MG tablet     Return in about 4 months (around 04/05/2023) for health maintenance exam, FBW a week prior to visit - ok to stay with me .         Carlean Jews, NP  [facility] at [facility] [phone] (phone) [phone] (fax)  [facility]

## 2022-12-04 ENCOUNTER — Other Ambulatory Visit: Payer: Self-pay | Admitting: Nurse Practitioner

## 2022-12-04 ENCOUNTER — Ambulatory Visit (INDEPENDENT_AMBULATORY_CARE_PROVIDER_SITE_OTHER): Payer: BC Managed Care – PPO | Admitting: Nurse Practitioner

## 2022-12-04 ENCOUNTER — Encounter: Payer: Self-pay | Admitting: Nurse Practitioner

## 2022-12-04 DIAGNOSIS — E785 Hyperlipidemia, unspecified: Secondary | ICD-10-CM

## 2022-12-04 DIAGNOSIS — I10 Essential (primary) hypertension: Secondary | ICD-10-CM

## 2022-12-04 DIAGNOSIS — I725 Aneurysm of other precerebral arteries: Secondary | ICD-10-CM

## 2022-12-04 DIAGNOSIS — I69393 Ataxia following cerebral infarction: Secondary | ICD-10-CM

## 2022-12-04 DIAGNOSIS — F4323 Adjustment disorder with mixed anxiety and depressed mood: Secondary | ICD-10-CM | POA: Diagnosis not present

## 2022-12-04 DIAGNOSIS — I69319 Unspecified symptoms and signs involving cognitive functions following cerebral infarction: Secondary | ICD-10-CM

## 2022-12-04 DIAGNOSIS — R636 Underweight: Secondary | ICD-10-CM | POA: Diagnosis not present

## 2022-12-04 DIAGNOSIS — M792 Neuralgia and neuritis, unspecified: Secondary | ICD-10-CM

## 2022-12-04 MED ORDER — ATORVASTATIN CALCIUM 20 MG PO TABS: ORAL_TABLET | ORAL | 1 refills | Status: DC

## 2022-12-04 MED ORDER — AMLODIPINE BESYLATE 2.5 MG PO TABS: 2.5000 mg | ORAL_TABLET | Freq: Every day | ORAL | 1 refills | Status: DC

## 2022-12-04 MED ORDER — SERTRALINE HCL 50 MG PO TABS: 50.0000 mg | ORAL_TABLET | Freq: Every day | ORAL | 1 refills | Status: DC

## 2022-12-04 MED ORDER — MEGESTROL ACETATE 20 MG PO TABS: 20.0000 mg | ORAL_TABLET | Freq: Two times a day (BID) | ORAL | 1 refills | Status: DC

## 2022-12-30 DIAGNOSIS — R636 Underweight: Secondary | ICD-10-CM | POA: Insufficient documentation

## 2023-04-10 ENCOUNTER — Other Ambulatory Visit: Payer: Self-pay

## 2023-04-10 DIAGNOSIS — Z130 Encounter for screening for diseases of the blood and blood-forming organs and certain disorders involving the immune mechanism: Secondary | ICD-10-CM

## 2023-04-10 DIAGNOSIS — Z0000 Encounter for general adult medical examination without abnormal findings: Secondary | ICD-10-CM

## 2023-04-16 ENCOUNTER — Other Ambulatory Visit: Payer: BC Managed Care – PPO

## 2023-04-17 ENCOUNTER — Other Ambulatory Visit: Payer: BC Managed Care – PPO

## 2023-04-17 DIAGNOSIS — Z0000 Encounter for general adult medical examination without abnormal findings: Secondary | ICD-10-CM

## 2023-04-17 DIAGNOSIS — Z130 Encounter for screening for diseases of the blood and blood-forming organs and certain disorders involving the immune mechanism: Secondary | ICD-10-CM

## 2023-04-18 LAB — CBC
Hematocrit: 54.2 % — ABNORMAL HIGH (ref 34.0–46.6)
Hemoglobin: 18.1 g/dL — ABNORMAL HIGH (ref 11.1–15.9)
MCH: 32.6 pg (ref 26.6–33.0)
MCHC: 33.4 g/dL (ref 31.5–35.7)
MCV: 98 fL — ABNORMAL HIGH (ref 79–97)
Platelets: 311 10*3/uL (ref 150–450)
RBC: 5.56 x10E6/uL — ABNORMAL HIGH (ref 3.77–5.28)
RDW: 12.9 % (ref 11.7–15.4)
WBC: 14.3 10*3/uL — ABNORMAL HIGH (ref 3.4–10.8)

## 2023-04-18 LAB — COMPREHENSIVE METABOLIC PANEL
ALT: 15 IU/L (ref 0–32)
AST: 19 IU/L (ref 0–40)
Albumin/Globulin Ratio: 1.8 (ref 1.2–2.2)
Albumin: 4.3 g/dL (ref 3.9–4.9)
Alkaline Phosphatase: 123 IU/L — ABNORMAL HIGH (ref 44–121)
BUN/Creatinine Ratio: 10 (ref 9–23)
BUN: 9 mg/dL (ref 6–24)
Bilirubin Total: <0.2 mg/dL (ref 0.0–1.2)
CO2: 22 mmol/L (ref 20–29)
Calcium: 9.3 mg/dL (ref 8.7–10.2)
Chloride: 103 mmol/L (ref 96–106)
Creatinine, Ser: 0.87 mg/dL (ref 0.57–1.00)
Globulin, Total: 2.4 g/dL (ref 1.5–4.5)
Glucose: 98 mg/dL (ref 70–99)
Potassium: 3.9 mmol/L (ref 3.5–5.2)
Sodium: 143 mmol/L (ref 134–144)
Total Protein: 6.7 g/dL (ref 6.0–8.5)
eGFR: 83 mL/min/{1.73_m2} (ref >59)

## 2023-04-18 LAB — HEMOGLOBIN A1C
Est. average glucose Bld gHb Est-mCnc: 103 mg/dL
Hgb A1c MFr Bld: 5.2 % (ref 4.8–5.6)

## 2023-04-18 LAB — LIPID PANEL
Chol/HDL Ratio: 2.4 ratio (ref 0.0–4.4)
Cholesterol, Total: 167 mg/dL (ref 100–199)
HDL: 69 mg/dL (ref >39)
LDL Chol Calc (NIH): 69 mg/dL (ref 0–99)
Triglycerides: 177 mg/dL — ABNORMAL HIGH (ref 0–149)
VLDL Cholesterol Cal: 29 mg/dL (ref 5–40)

## 2023-04-18 LAB — TSH: TSH: 0.553 u[IU]/mL (ref 0.450–4.500)

## 2023-04-19 ENCOUNTER — Ambulatory Visit
Admission: EM | Admit: 2023-04-19 | Discharge: 2023-04-19 | Disposition: A | Discharge status: Home / Self Care | Payer: BC Managed Care – PPO | Attending: Internal Medicine | Admitting: Internal Medicine

## 2023-04-19 DIAGNOSIS — S61411A Laceration without foreign body of right hand, initial encounter: Secondary | ICD-10-CM

## 2023-04-19 DIAGNOSIS — Z23 Encounter for immunization: Secondary | ICD-10-CM

## 2023-04-19 MED ORDER — TETANUS-DIPHTH-ACELL PERTUSSIS 5-2.5-18.5 LF-MCG/0.5 IM SUSY
0.5000 mL | PREFILLED_SYRINGE | Freq: Once | INTRAMUSCULAR | Status: AC
  Administered 2023-04-19: 0.5 mL via INTRAMUSCULAR

## 2023-04-19 NOTE — ED Provider Notes (Signed)
[facility]    CSN: [id number] Arrival date & time: 04/19/23  1203      History   Chief Complaint Chief Complaint  Patient presents with   Laceration    HPI [name] Barnett is a 47 y.o. female.   Patient presents to urgent care with her husband who contributes to the history for evaluation of laceration to the palm of the right hand that happened last night at around 10pm. She was opening a soup can when she accidentally cut her hand.  Wound bled significantly last night but bleeding has stopped with pressure.  She does not take any blood thinning medications.  She was able to wash her hand and cleanse the wound well. Unsure of last tetanus injection.      Past Medical History:  Diagnosis Date   Duodenal obstruction    GERD (gastroesophageal reflux disease)    Hypertension    Stroke [facility])    april 2018, lt sided weakness   Trimalleolar fracture of ankle, closed, right, initial encounter     Patient Active Problem List   Diagnosis Date Noted   Underweight due to inadequate caloric intake 12/30/2022   Protein-calorie malnutrition ([name]) 09/04/2022   Hyperlipidemia 02/01/2022   Low TSH level 03/25/2019   GAD (generalized anxiety disorder) 03/25/2019   Trimalleolar fracture of ankle, closed, right, initial encounter 11/27/2018   Tobacco use disorder 11/27/2018   Osteoarthritis of right hip 10/07/2018   Ataxia, 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   Weakness with dizziness-  since [facility] and CVA 04/07/17 08/07/2017   Disturbances of vision, late effect of stroke 08/06/2017   Health education/counseling 08/05/2017   High risk medications (not anticoagulants) long-term use 08/05/2017   Neuritis-  R sided:  arm and leg/ body due to stroke 08/05/2017   Elevated LDL cholesterol level 07/11/2017   National Institutes of Health (NIH) Stroke Scale limb ataxia score 2, ataxia present in two limbs 06/25/2017   Alteration of sensation as late effect of stroke 06/25/2017   Elevated  vitamin B12 level 05/30/2017   Vitamin D deficiency 05/29/2017   History of tobacco abuse-  30pk yr hx - quit 04/07/17 05/21/2017   Gait disturbance, post-stroke 05/14/2017   HTN, goal below 130/80    Vitreous hemorrhage of right eye ([name])    Adjustment disorder with mixed anxiety and depressed mood    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 04/23/2017   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   s/p SAH (subarachnoid hemorrhage) ([name]) 04/19/2017   Basilar artery aneurysm ([name])    Hypoxia    Subarachnoid hemorrhage due to ruptured aneurysm ([name]) 04/07/2017   CVA (cerebral vascular accident) ([name]) 04/07/2017   Elevated gastrin level 01/25/2012   Hypokalemia 01/21/2012   Nausea & vomiting 01/20/2012   Epigastric pain 01/20/2012   Duodenal ulcer, acute with obstruction 10/17/2011   S/P laparoscopic cholecystectomy 10/16/2011    Past Surgical History:  Procedure Laterality Date   BALLOON DILATION  12/31/2011   Procedure: BALLOON DILATION;  Surgeon: Barrie Folk, MD;  Location: [facility] ENDOSCOPY;  Service: Endoscopy;  Laterality: N/A;   CHOLECYSTECTOMY  07/28/11   ESOPHAGOGASTRODUODENOSCOPY  10/17/2011   Procedure: ESOPHAGOGASTRODUODENOSCOPY (EGD);  Surgeon: Barrie Folk, MD;  Location: [facility] ENDOSCOPY;  Service: Endoscopy;  Laterality: N/A;   IR ANGIO INTRA EXTRACRAN SEL INTERNAL CAROTID BILAT MOD SED  04/07/2017   IR ANGIO INTRA EXTRACRAN SEL INTERNAL CAROTID BILAT MOD SED  01/07/2018   IR ANGIO VERTEBRAL SEL VERTEBRAL UNI  R MOD SED  04/07/2017   IR ANGIO VERTEBRAL SEL VERTEBRAL UNI R MOD SED  01/07/2018   IR ANGIOGRAM FOLLOW UP STUDY  04/07/2017   IR ANGIOGRAM FOLLOW UP STUDY  04/07/2017   IR ANGIOGRAM FOLLOW UP STUDY  04/07/2017   IR ANGIOGRAM FOLLOW UP STUDY  04/07/2017   IR ANGIOGRAM FOLLOW UP STUDY  04/07/2017   IR ANGIOGRAM SELECTIVE EACH ADDITIONAL VESSEL  04/07/2017   IR TRANSCATH/EMBOLIZ  04/07/2017   IR US GUIDE VASC ACCESS RIGHT  01/07/2018   ORIF ANKLE FRACTURE Right 07/01/2018    Procedure: RIGHT ANKLE FRACTURE OPEN TREATMENT TRIMALLEOLAR INCLUDES INTERNAL FIXATION WITHOUT FIXATION OF POSTERIOR LIP, FRACTURE CLOSED TREATMENT TRIMALLEOLAR ANKLE WITH MANIPULATION, REPAIR SYNDESMOSIS, REPAIR DELTOID LIGAMENT;  Surgeon: Sheral Apley, MD;  Location: [facility];  Service: Orthopedics;  Laterality: Right;   PARS PLANA VITRECTOMY Right 05/01/2017   Procedure: PARS PLANA VITRECTOMY WITH 25 GAUGE RIGHT EYE, endolaser photocoaglation;  Surgeon: Carmela Rima, MD;  Location: [facility] OR;  Service: Ophthalmology;  Laterality: Right;   RADIOLOGY WITH ANESTHESIA N/A 04/07/2017   Procedure: RADIOLOGY WITH ANESTHESIA;  Surgeon: Lisbeth Renshaw, MD;  Location: [facility] OR;  Service: Radiology;  Laterality: N/A;   REPLACEMENT TOTAL KNEE BILATERAL  2004   TEMPOROMANDIBULAR JOINT SURGERY     2 surgeries   TUMOR REMOVAL     VAGOTOMY  01/23/2012   Procedure: VAGOTOMY, antrectomy and BII;  Surgeon: Currie Paris, MD;  Location: [facility] OR;  Service: General;  Laterality: N/A;  Laparotomy with vagotomy.    OB History   No obstetric history on file.      Home Medications    Prior to Admission medications   Medication Sig Start Date End Date Taking? Authorizing Provider  amLODipine (NORVASC) 2.5 MG tablet Take 1 tablet (2.5 mg total) by mouth daily. 12/04/22   Carlean Jews, NP  atorvastatin (LIPITOR) 20 MG tablet Take 1 tablet po QPM 12/04/22   Boscia, Heather E, NP  gabapentin (NEURONTIN) 600 MG tablet TAKE 1 TABLET BY MOUTH THREE TIMES A DAY 12/04/22   Carlean Jews, NP  HYDROcodone-acetaminophen (NORCO/VICODIN) 5-325 MG tablet Take 1 tablet by mouth every 6 (six) hours as needed. 03/15/22   [provider]  megestrol (MEGACE) 20 MG tablet Take 1 tablet (20 mg total) by mouth 2 (two) times daily. 12/04/22   Carlean Jews, NP  Multiple Vitamin (MULTIVITAMIN WITH MINERALS) TABS tablet Take 1 tablet by mouth daily.    [provider]  sertraline  (ZOLOFT) 50 MG tablet Take 1 tablet (50 mg total) by mouth daily. 12/04/22   Carlean Jews, NP  Vitamin D, Ergocalciferol, (DRISDOL) 1.25 MG (50000 UNIT) CAPS capsule Take 1 capsule (50,000 Units total) by mouth every 7 (seven) days. **PLEASE CONTACT OUR OFFICE TO SCHEDULE A LAB VISIT FOR FUTURE MED REFILLS** 09/08/21   Mayer Masker, PA-C    Family History Family History  Problem Relation Age of Onset   Hypertension Mother    Restless legs syndrome Mother    Hyperlipidemia Mother    Heart disease Father    Malignant hyperthermia Neg Hx     Social History Social History   Tobacco Use   Smoking status: Every Day    Packs/day: 1.00    Years: 20.00    Additional pack years: 0.00    Total pack years: 20.00    Types: Cigarettes    Last attempt to quit: 06/24/2018    Years since quitting: 4.8  Smokeless tobacco: Never  Vaping Use   Vaping Use: Never used  Substance Use Topics   Alcohol use: Not Currently   Drug use: No     Allergies   Nsaids   Review of Systems Review of Systems Per HPI  Physical Exam Triage Vital Signs ED Triage Vitals  Enc Vitals Group     BP 04/19/23 1353 (!) 149/82     Pulse Rate 04/19/23 1353 86     Resp 04/19/23 1353 20     Temp 04/19/23 1353 97.9 F (36.6 C)     Temp Source 04/19/23 1353 Oral     SpO2 04/19/23 1353 94 %     Weight --      Height --      Head Circumference --      Peak Flow --      Pain Score 04/19/23 1354 5     Pain Loc --      Pain Edu? --      Excl. in GC? --    No data found.  Updated Vital Signs BP (!) 149/82 (BP Location: Left Arm)   Pulse 86   Temp 97.9 F (36.6 C) (Oral)   Resp 20   LMP  (LMP Unknown)   SpO2 94%   Visual Acuity Right Eye Distance:   Left Eye Distance:   Bilateral Distance:    Right Eye Near:   Left Eye Near:    Bilateral Near:     Physical Exam Vitals and nursing note reviewed.  Constitutional:      Appearance: She is not ill-appearing or toxic-appearing.  HENT:      Head: Normocephalic and atraumatic.     Right Ear: Hearing and external ear normal.     Left Ear: Hearing and external ear normal.     Nose: Nose normal.     Mouth/Throat:     Lips: Pink.  Eyes:     General: Lids are normal. Vision grossly intact. Gaze aligned appropriately.     Extraocular Movements: Extraocular movements intact.     Conjunctiva/sclera: Conjunctivae normal.  Pulmonary:     Effort: Pulmonary effort is normal.  Musculoskeletal:     Cervical back: Neck supple.  Skin:    General: Skin is warm and dry.     Capillary Refill: Capillary refill takes less than 2 seconds.     Findings: Laceration present. No rash.       Neurological:     General: No focal deficit present.     Mental Status: She is alert and oriented to person, place, and time. Mental status is at baseline.     Cranial Nerves: No dysarthria or facial asymmetry.  Psychiatric:        Mood and Affect: Mood normal.        Speech: Speech normal.        Behavior: Behavior normal.        Thought Content: Thought content normal.        Judgment: Judgment normal.        UC Treatments / Results  Labs (all labs ordered are listed, but only abnormal results are displayed) Labs Reviewed - No data to display  EKG   Radiology No results found.  Procedures Laceration Repair  Date/Time: 04/19/2023 2:58 PM  Performed by: Carlisle Beers, FNP Authorized by: Carlisle Beers, FNP   Consent:    Consent obtained:  Verbal   Consent given by:  Patient   Risks, benefits, and  alternatives were discussed: yes     Risks discussed:  Infection, need for additional repair, nerve damage, poor wound healing, poor cosmetic result, pain, retained foreign body, tendon damage and vascular damage   Alternatives discussed:  No treatment Universal protocol:    Patient identity confirmed:  Verbally with patient Anesthesia:    Anesthesia method:  Local infiltration   Local anesthetic:  Lidocaine 1% w/o  epi Laceration details:    Location:  Hand   Hand location:  R palm   Length (cm):  4   Depth (mm):  4 Exploration:    Wound extent: no foreign bodies/material noted   Treatment:    Area cleansed with:  Chlorhexidine and povidone-iodine   Amount of cleaning:  Standard Skin repair:    Repair method:  Sutures   Suture size:  5-0   Suture material:  Prolene   Suture technique:  Simple interrupted   Number of sutures:  3 Approximation:    Approximation:  Close Repair type:    Repair type:  Simple Post-procedure details:    Dressing:  Non-adherent dressing   Procedure completion:  Tolerated well, no immediate complications  (including critical care time)  Medications Ordered in UC Medications  Tdap (BOOSTRIX) injection 0.5 mL (0.5 mLs Intramuscular Given 04/19/23 1431)    Initial Impression / Assessment and Plan / UC Course  I have reviewed the triage vital signs and the nursing notes.  Pertinent labs & imaging results that were available during my care of the patient were reviewed by me and considered in my medical decision making (see chart for details).   1. Laceration of right hand without foreign body Laceration to the right hand repaired. See procedure note above for details. Wound cleansed and dressed with nonstick dressing in clinic. Patient instructed to keep wound dry for 24 hours, then they may clean it with antibacterial soap and water gently. Advised not to scrub or rub site to avoid causing the sutures to separate. Dressing changes 2 times daily with nonstick dressing. No ointments, lotions, or powders to the site until site heals. Suture removal in 10 days. Advised to monitor site for signs of infection (redness, swelling, pus, pain) and return to UC sooner than suture removal if infected. Tylenol may be used every 6 hours as needed for pain once numbing wears off. Advised to rest hand and avoid exposure to potentially infectious environment. Encouraged to avoid  activities that increase tension to the wound/sutures. Tdap updated.  Discussed physical exam and available lab work findings in clinic with patient.  Counseled patient regarding appropriate use of medications and potential side effects for all medications recommended or prescribed today. Discussed red flag signs and symptoms of worsening condition,when to call the PCP office, return to urgent care, and when to seek higher level of care in the emergency department. Patient verbalizes understanding and agreement with plan. All questions answered. Patient discharged in stable condition.     Final Clinical Impressions(s) / UC Diagnoses   Final diagnoses:  Laceration of right hand without foreign body, initial encounter     Discharge Instructions      Wound care: Please keep the area surrounding the wound/sutures clean and dry for the next 24 hours. After 24 hours, you may get the wound wet. Gently clean wound with antibacterial soap. Do not scrub wound. Cover the area with a nonstick bandage and change the bandage 2 times a day.   You should have the sutures removed in 10 days by  your primary care provider or at urgent care. Return sooner than 10 days if you experience discharge from your laceration, redness around your laceration, warmth around your laceration, or fever.   You may take tylenol 1,000mg  every 6 hours as needed for aches/pains to your laceration once the numbing wears off.   Thanks for letting me fix your cut today! Feel better!      ED Prescriptions   None    PDMP not reviewed this encounter.   Carlisle Beers, [place] 04/19/23 1501

## 2023-04-19 NOTE — ED Triage Notes (Signed)
Patient with complaints of laceration to right hand after opening a soup can last night. Bleeding controlled in triage.

## 2023-04-19 NOTE — ED Notes (Signed)
Tdap give in right Deltoid NOT left Deltoid.

## 2023-04-19 NOTE — Discharge Instructions (Addendum)
Wound care: Please keep the area surrounding the wound/sutures clean and dry for the next 24 hours. After 24 hours, you may get the wound wet. Gently clean wound with antibacterial soap. Do not scrub wound. Cover the area with a nonstick bandage and change the bandage 2 times a day.   You should have the sutures removed in 10 days by your primary care provider or at urgent care. Return sooner than 10 days if you experience discharge from your laceration, redness around your laceration, warmth around your laceration, or fever.   You may take tylenol 1,000mg  every 6 hours as needed for aches/pains to your laceration once the numbing wears off.   Thanks for letting me fix your cut today! Feel better!

## 2023-04-23 ENCOUNTER — Encounter: Payer: BC Managed Care – PPO | Admitting: Nurse Practitioner

## 2023-04-29 ENCOUNTER — Ambulatory Visit
Admission: RE | Admit: 2023-04-29 | Discharge: 2023-04-29 | Disposition: A | Discharge status: Home / Self Care | Payer: BC Managed Care – PPO | Source: Ambulatory Visit | Attending: Family Medicine | Admitting: Family Medicine

## 2023-04-29 DIAGNOSIS — Z4802 Encounter for removal of sutures: Secondary | ICD-10-CM | POA: Diagnosis not present

## 2023-04-29 NOTE — ED Triage Notes (Addendum)
Pt here for suture removal for laceration on 04/19/2023.

## 2023-05-14 ENCOUNTER — Encounter: Payer: BC Managed Care – PPO | Admitting: Nurse Practitioner

## 2023-06-10 ENCOUNTER — Other Ambulatory Visit: Payer: Self-pay | Admitting: Nurse Practitioner

## 2023-06-10 DIAGNOSIS — I10 Essential (primary) hypertension: Secondary | ICD-10-CM

## 2023-06-10 NOTE — Progress Notes (Signed)
Elevation of white blood cell count, hemoglobin, and hematocrit.  Will need to recheck at next office visit.  Mild elevation of triglycerides.  Other labs normal.

## 2023-06-15 ENCOUNTER — Other Ambulatory Visit: Payer: Self-pay | Admitting: Nurse Practitioner

## 2023-06-15 DIAGNOSIS — E785 Hyperlipidemia, unspecified: Secondary | ICD-10-CM

## 2023-07-02 ENCOUNTER — Other Ambulatory Visit: Payer: Self-pay | Admitting: Nurse Practitioner

## 2023-07-02 DIAGNOSIS — I10 Essential (primary) hypertension: Secondary | ICD-10-CM

## 2023-07-03 ENCOUNTER — Telehealth: Payer: Self-pay | Admitting: *Deleted

## 2023-07-03 NOTE — Telephone Encounter (Signed)
[name] Barnett, [name] Barnett, CMA Pt's husband cld states pt is out of Rx : amLODipine (NORVASC) 2.5 MG tablet [[id number]]   Order Details  Dose: 2.5 mg Route: Oral Frequency: Daily Dispense Quantity: 90 tablet Refills: 1 Indications of Use: Hypertension   Sig: Take 1 tablet (2.5 mg total) by mouth daily.   ----Please send refill order to :  [id number] [facility], [place] - [address] [place] [address] Phone: [phone]  Fax: [phone]  Thx

## 2023-07-03 NOTE — Telephone Encounter (Signed)
This was filled today

## 2023-07-03 NOTE — Telephone Encounter (Signed)
-----   Message from Langley Adie sent at 07/02/2023  4:34 PM EDT ----- Regarding: Rx refill request Pt's husband cld states pt is out of Rx :  amLODipine (NORVASC) 2.5 MG tablet [[id number]]   Order Details  Dose: 2.5 mg Route: Oral Frequency: Daily Dispense Quantity: 90 tablet Refills: 1  Indications of Use: Hypertension     Sig: Take 1 tablet (2.5 mg total) by mouth daily.     ----Please send refill order to :   [id number], [place] - [address] [place] [address] Phone: [phone]  Fax: [phone]   Thx

## 2023-07-25 ENCOUNTER — Other Ambulatory Visit: Payer: Self-pay | Admitting: Nurse Practitioner

## 2023-07-25 DIAGNOSIS — E785 Hyperlipidemia, unspecified: Secondary | ICD-10-CM

## 2023-08-20 ENCOUNTER — Telehealth: Payer: Self-pay | Admitting: *Deleted

## 2023-08-20 ENCOUNTER — Other Ambulatory Visit: Payer: Self-pay | Admitting: Nurse Practitioner

## 2023-08-20 DIAGNOSIS — E785 Hyperlipidemia, unspecified: Secondary | ICD-10-CM

## 2023-08-20 MED ORDER — ATORVASTATIN CALCIUM 20 MG PO TABS: ORAL_TABLET | ORAL | 0 refills | Status: DC

## 2023-08-20 NOTE — Telephone Encounter (Signed)
Prescription Request  08/20/2023  LOV: 12/04/22 ROV:09/16/23   What is the name of the medication or equipment? atorvastatin (LIPITOR) 20 MG tablet   Have you contacted your pharmacy to request a refill? Yes   Which pharmacy would you like this sent to?  [id number] [address], [place] - [address] [place] [address] Phone: [phone] Fax: [phone]     Patient notified that their request is being sent to the clinical staff for review and that they should receive a response within 2 business days.   Please advise at Mobile [phone] (mobile)

## 2023-08-20 NOTE — Telephone Encounter (Signed)
30 day sent 

## 2023-09-05 ENCOUNTER — Other Ambulatory Visit: Payer: Self-pay | Admitting: Nurse Practitioner

## 2023-09-05 DIAGNOSIS — F4323 Adjustment disorder with mixed anxiety and depressed mood: Secondary | ICD-10-CM

## 2023-09-11 ENCOUNTER — Other Ambulatory Visit: Payer: Self-pay | Admitting: Family Medicine

## 2023-09-11 DIAGNOSIS — E785 Hyperlipidemia, unspecified: Secondary | ICD-10-CM

## 2023-09-16 ENCOUNTER — Encounter: Payer: Self-pay | Admitting: Family Medicine

## 2023-09-16 ENCOUNTER — Ambulatory Visit: Payer: BC Managed Care – PPO | Admitting: Family Medicine

## 2023-09-16 VITALS — BP 157/81 | HR 82 | Resp 18 | Ht 64.0 in | Wt 95.0 lb

## 2023-09-16 DIAGNOSIS — E782 Mixed hyperlipidemia: Secondary | ICD-10-CM

## 2023-09-16 DIAGNOSIS — E785 Hyperlipidemia, unspecified: Secondary | ICD-10-CM

## 2023-09-16 DIAGNOSIS — F32A Depression, unspecified: Secondary | ICD-10-CM

## 2023-09-16 DIAGNOSIS — I10 Essential (primary) hypertension: Secondary | ICD-10-CM

## 2023-09-16 DIAGNOSIS — F419 Anxiety disorder, unspecified: Secondary | ICD-10-CM

## 2023-09-16 MED ORDER — SERTRALINE HCL 50 MG PO TABS: 50.0000 mg | ORAL_TABLET | Freq: Every day | ORAL | 1 refills | Status: DC

## 2023-09-16 MED ORDER — AMLODIPINE BESYLATE 2.5 MG PO TABS
2.5000 mg | ORAL_TABLET | Freq: Every day | ORAL | 1 refills | Status: DC
Start: 2023-09-16 — End: 2023-12-09

## 2023-09-16 MED ORDER — ATORVASTATIN CALCIUM 20 MG PO TABS: ORAL_TABLET | ORAL | 1 refills | Status: DC

## 2023-09-16 NOTE — Patient Instructions (Addendum)
Please check your blood pressure at home twice a day for the next week and keep a log of the values.  You can send me a picture of that or drop it off at the office, which ever is easiest for you.  I will see you in about 6 months so we can update your labs and do a physical exam of your eyes/ears/mouth, listen to your heart and lungs, listen to your stomach, and check your reflexes.  If you do need anything before that time, please let me know!

## 2023-09-16 NOTE — Assessment & Plan Note (Signed)
BP goal <130/80 given history of CVA.  Blood pressure well above goal in office initially and on recheck.  Advised to check blood pressure at home and keep a log for the next week before sending me a log.  Will adjust medication if consistently above goal or <100/60.  Check CMP with blood work at annual physical.

## 2023-09-16 NOTE — Assessment & Plan Note (Signed)
Last lipid panel: LDL 69, HDL 69, triglycerides 177.  Continue atorvastatin 20 mg daily.  Recheck lipid panel and CMP with annual physical.  Will continue to monitor.

## 2023-09-16 NOTE — Progress Notes (Signed)
Established Patient Office Visit  Subjective   Patient ID: [name] Barnett, female    DOB: 1975-12-12  Age: 47 y.o. MRN: [id number]  Chief Complaint  Patient presents with   Hyperlipidemia   Hypertension    HPI [name] Barnett is a 47 y.o. female presenting today for follow up of hypertension, hyperlipidemia, mood. Hypertension:  Pt denies chest pain, SOB, dizziness, edema, syncope, fatigue or heart palpitations. Taking amlodipine 2.5 mg daily, reports excellent compliance with treatment. Denies side effects. Hyperlipidemia: tolerating atorvastatin 20 mg daily well with no myalgias or significant side effects.  The ASCVD Risk score (Arnett DK, et al., 2019) failed to calculate for the following reasons:   The patient has a prior MI or stroke diagnosis Mood: Patient is here to follow up for depression and anxiety, currently managing with Zoloft 50 mg daily. Taking medication without side effects, reports excellent compliance with treatment.  She states that Zoloft works okay, but she is nervous to change medication so she will continue with her current prescription.  She previously tried mirtazapine and it caused excessive somnolence.   Outpatient Medications Prior to Visit  Medication Sig   gabapentin (NEURONTIN) 600 MG tablet TAKE 1 TABLET BY MOUTH THREE TIMES A DAY   HYDROcodone-acetaminophen (NORCO/VICODIN) 5-325 MG tablet Take 1 tablet by mouth every 6 (six) hours as needed.   megestrol (MEGACE) 20 MG tablet Take 1 tablet (20 mg total) by mouth 2 (two) times daily.   Multiple Vitamin (MULTIVITAMIN WITH MINERALS) TABS tablet Take 1 tablet by mouth daily.   [DISCONTINUED] amLODipine (NORVASC) 2.5 MG tablet TAKE 1 TABLET BY MOUTH EVERY DAY   [DISCONTINUED] atorvastatin (LIPITOR) 20 MG tablet Take 1 tablet po QPM   [DISCONTINUED] sertraline (ZOLOFT) 50 MG tablet Take 1 tablet (50 mg total) by mouth daily.   [DISCONTINUED] Vitamin D, Ergocalciferol, (DRISDOL) 1.25 MG (50000 UNIT) CAPS  capsule Take 1 capsule (50,000 Units total) by mouth every 7 (seven) days. **PLEASE CONTACT OUR OFFICE TO SCHEDULE A LAB VISIT FOR FUTURE MED REFILLS**   No facility-administered medications prior to visit.    ROS Negative unless otherwise noted in HPI   Objective:     BP (!) 157/81 (BP Location: Left Arm, Patient Position: Sitting, Cuff Size: Normal)   Pulse 82   Resp 18   Ht 5\' 4"  (1.626 m)   Wt 95 lb (43.1 kg)   LMP  (LMP Unknown)   SpO2 94%   BMI 16.31 kg/m   Physical Exam Constitutional:      General: She is not in acute distress.    Appearance: Normal appearance.  HENT:     Head: Normocephalic and atraumatic.  Cardiovascular:     Rate and Rhythm: Normal rate and regular rhythm.     Heart sounds: No murmur heard.    No friction rub. No gallop.  Pulmonary:     Effort: Pulmonary effort is normal. No respiratory distress.     Breath sounds: No wheezing, rhonchi or rales.  Skin:    General: Skin is warm and dry.  Neurological:     Mental Status: She is alert and oriented to person, place, and time.     Assessment & Plan:  HTN, goal below 130/80 Assessment & Plan: BP goal <130/80 given history of CVA.  Blood pressure well above goal in office initially and on recheck.  Advised to check blood pressure at home and keep a log for the next week before sending me a log.  Will adjust medication if consistently above goal or <100/60.  Check CMP with blood work at annual physical.  Orders: -     amLODIPine Besylate; Take 1 tablet (2.5 mg total) by mouth daily.  Dispense: 90 tablet; Refill: 1  Mixed hyperlipidemia Assessment & Plan: Last lipid panel: LDL 69, HDL 69, triglycerides 177.  Continue atorvastatin 20 mg daily.  Recheck lipid panel and CMP with annual physical.  Will continue to monitor.   Hyperlipidemia, unspecified hyperlipidemia type Assessment & Plan: Last lipid panel: LDL 69, HDL 69, triglycerides 177.  Continue atorvastatin 20 mg daily.  Recheck lipid  panel and CMP with annual physical.  Will continue to monitor.  Orders: -     Atorvastatin Calcium; Take 1 tablet po QPM  Dispense: 90 tablet; Refill: 1  Anxiety and depression Assessment & Plan: Zoloft is not completely effective, but patient is not interested in changing medication routine out of fear for potential adverse effects.  Continue Zoloft 50 mg daily, will continue to monitor.  Orders: -     Sertraline HCl; Take 1 tablet (50 mg total) by mouth daily.  Dispense: 90 tablet; Refill: 1  Discussed what is done at an annual physical.  Patient adamant that she does not want screenings for cervical cancer, colon cancer, breast cancer as she is firm in her belief that if you start testing for things, you will die.  We discussed that the annual physical also includes routine lab work and a physical exam, she is agreeable to that portion.  Return in about 6 months (around 03/16/2024) for annual physical, fasting blood work 1 week before.    Melida Quitter, PA

## 2023-09-16 NOTE — Assessment & Plan Note (Signed)
Zoloft is not completely effective, but patient is not interested in changing medication routine out of fear for potential adverse effects.  Continue Zoloft 50 mg daily, will continue to monitor.

## 2023-11-19 ENCOUNTER — Other Ambulatory Visit: Payer: Self-pay | Admitting: Nurse Practitioner

## 2023-11-19 DIAGNOSIS — I69319 Unspecified symptoms and signs involving cognitive functions following cerebral infarction: Secondary | ICD-10-CM

## 2023-11-19 DIAGNOSIS — M792 Neuralgia and neuritis, unspecified: Secondary | ICD-10-CM

## 2023-11-19 DIAGNOSIS — I725 Aneurysm of other precerebral arteries: Secondary | ICD-10-CM

## 2023-11-19 DIAGNOSIS — I69393 Ataxia following cerebral infarction: Secondary | ICD-10-CM

## 2023-11-19 NOTE — Telephone Encounter (Signed)
Hey there. This patient has you listed as her PCP now.

## 2023-12-09 ENCOUNTER — Telehealth: Payer: Self-pay

## 2023-12-09 DIAGNOSIS — I10 Essential (primary) hypertension: Secondary | ICD-10-CM

## 2023-12-09 MED ORDER — AMLODIPINE BESYLATE 5 MG PO TABS
5.0000 mg | ORAL_TABLET | Freq: Every day | ORAL | 1 refills | Status: DC
Start: 2023-12-09 — End: 2024-07-31

## 2023-12-09 NOTE — Telephone Encounter (Signed)
Copied from CRM [id number]. Topic: Clinical - Medication Question >> Dec 09, 2023 11:46 AM [name] Barnett wrote: Reason for CRM: Patient spouse called to let [name] know that he been giving patient 2 of the 2.5 was amLODipine (NORVASC) 2.5 MG tablet , reason her blood pressure was still running high after taken the 2.5 mg as prescribed by [name] , and request for next refill for the if can be amLODipine (NORVASC) 2.5 MG tablet  5 mg instead which helps blood pressure, patient has about 20 tablets left have about 2 week left even though it was a 90 days prescription , will be running out since been taken 2 of the medication

## 2023-12-09 NOTE — Telephone Encounter (Signed)
Meds ordered this encounter  Medications   amLODipine (NORVASC) 5 MG tablet    Sig: Take 1 tablet (5 mg total) by mouth daily.    Dispense:  90 tablet    Refill:  1    Supervising Provider:   Sandre Kitty [[id number]]

## 2024-01-02 ENCOUNTER — Other Ambulatory Visit: Payer: Self-pay | Admitting: Family Medicine

## 2024-01-02 DIAGNOSIS — F32A Depression, unspecified: Secondary | ICD-10-CM

## 2024-01-02 DIAGNOSIS — E785 Hyperlipidemia, unspecified: Secondary | ICD-10-CM

## 2024-01-16 ENCOUNTER — Encounter: Payer: Self-pay | Admitting: Family Medicine

## 2024-01-17 ENCOUNTER — Encounter: Payer: Self-pay | Admitting: Family Medicine

## 2024-03-09 ENCOUNTER — Other Ambulatory Visit: Payer: BC Managed Care – PPO

## 2024-03-11 ENCOUNTER — Other Ambulatory Visit

## 2024-03-16 ENCOUNTER — Encounter: Payer: BC Managed Care – PPO | Admitting: Family Medicine

## 2024-04-14 ENCOUNTER — Other Ambulatory Visit: Payer: Self-pay | Admitting: Family Medicine

## 2024-04-14 DIAGNOSIS — F419 Anxiety disorder, unspecified: Secondary | ICD-10-CM

## 2024-04-14 NOTE — Telephone Encounter (Signed)
Patient needs appointment for next refill

## 2024-05-07 ENCOUNTER — Other Ambulatory Visit: Payer: Self-pay | Admitting: *Deleted

## 2024-05-07 DIAGNOSIS — E559 Vitamin D deficiency, unspecified: Secondary | ICD-10-CM

## 2024-05-07 DIAGNOSIS — R7309 Other abnormal glucose: Secondary | ICD-10-CM

## 2024-05-07 DIAGNOSIS — E782 Mixed hyperlipidemia: Secondary | ICD-10-CM

## 2024-05-07 DIAGNOSIS — I10 Essential (primary) hypertension: Secondary | ICD-10-CM

## 2024-05-08 ENCOUNTER — Other Ambulatory Visit

## 2024-05-08 DIAGNOSIS — R7309 Other abnormal glucose: Secondary | ICD-10-CM

## 2024-05-08 DIAGNOSIS — I10 Essential (primary) hypertension: Secondary | ICD-10-CM

## 2024-05-08 DIAGNOSIS — E782 Mixed hyperlipidemia: Secondary | ICD-10-CM

## 2024-05-08 DIAGNOSIS — E559 Vitamin D deficiency, unspecified: Secondary | ICD-10-CM

## 2024-05-09 ENCOUNTER — Ambulatory Visit: Payer: Self-pay

## 2024-05-09 LAB — CBC WITH DIFFERENTIAL/PLATELET
Basophils Absolute: 0.1 10*3/uL (ref 0.0–0.2)
Basos: 1 %
EOS (ABSOLUTE): 0.2 10*3/uL (ref 0.0–0.4)
Eos: 1 %
Hematocrit: 48.9 % — ABNORMAL HIGH (ref 34.0–46.6)
Hemoglobin: 15.5 g/dL (ref 11.1–15.9)
Immature Grans (Abs): 0 10*3/uL (ref 0.0–0.1)
Immature Granulocytes: 0 %
Lymphocytes Absolute: 2.5 10*3/uL (ref 0.7–3.1)
Lymphs: 21 %
MCH: 31.4 pg (ref 26.6–33.0)
MCHC: 31.7 g/dL (ref 31.5–35.7)
MCV: 99 fL — ABNORMAL HIGH (ref 79–97)
Monocytes Absolute: 0.6 10*3/uL (ref 0.1–0.9)
Monocytes: 5 %
Neutrophils Absolute: 8.5 10*3/uL — ABNORMAL HIGH (ref 1.4–7.0)
Neutrophils: 72 %
Platelets: 376 10*3/uL (ref 150–450)
RBC: 4.93 x10E6/uL (ref 3.77–5.28)
RDW: 13.1 % (ref 11.7–15.4)
WBC: 11.8 10*3/uL — ABNORMAL HIGH (ref 3.4–10.8)

## 2024-05-09 LAB — LIPID PANEL
Chol/HDL Ratio: 2.8 ratio (ref 0.0–4.4)
Cholesterol, Total: 190 mg/dL (ref 100–199)
HDL: 68 mg/dL (ref >39)
LDL Chol Calc (NIH): 83 mg/dL (ref 0–99)
Triglycerides: 237 mg/dL — ABNORMAL HIGH (ref 0–149)
VLDL Cholesterol Cal: 39 mg/dL (ref 5–40)

## 2024-05-09 LAB — COMPREHENSIVE METABOLIC PANEL WITH GFR
ALT: 10 IU/L (ref 0–32)
AST: 18 IU/L (ref 0–40)
Albumin: 3.7 g/dL — ABNORMAL LOW (ref 3.9–4.9)
Alkaline Phosphatase: 151 IU/L — ABNORMAL HIGH (ref 44–121)
BUN/Creatinine Ratio: 13 (ref 9–23)
BUN: 10 mg/dL (ref 6–24)
Bilirubin Total: <0.2 mg/dL (ref 0.0–1.2)
CO2: 28 mmol/L (ref 20–29)
Calcium: 9 mg/dL (ref 8.7–10.2)
Chloride: 102 mmol/L (ref 96–106)
Creatinine, Ser: 0.8 mg/dL (ref 0.57–1.00)
Globulin, Total: 2.3 g/dL (ref 1.5–4.5)
Glucose: 88 mg/dL (ref 70–99)
Potassium: 4.4 mmol/L (ref 3.5–5.2)
Sodium: 143 mmol/L (ref 134–144)
Total Protein: 6 g/dL (ref 6.0–8.5)
eGFR: 91 mL/min/{1.73_m2} (ref >59)

## 2024-05-09 LAB — TSH: TSH: 0.464 u[IU]/mL (ref 0.450–4.500)

## 2024-05-09 LAB — VITAMIN D 25 HYDROXY (VIT D DEFICIENCY, FRACTURES): Vit D, 25-Hydroxy: 30 ng/mL (ref 30.0–100.0)

## 2024-05-09 LAB — HEMOGLOBIN A1C
Est. average glucose Bld gHb Est-mCnc: 105 mg/dL
Hgb A1c MFr Bld: 5.3 % (ref 4.8–5.6)

## 2024-05-15 ENCOUNTER — Ambulatory Visit (INDEPENDENT_AMBULATORY_CARE_PROVIDER_SITE_OTHER)

## 2024-05-15 VITALS — BP 136/83 | HR 80 | Ht 64.0 in | Wt 89.0 lb

## 2024-05-15 DIAGNOSIS — M792 Neuralgia and neuritis, unspecified: Secondary | ICD-10-CM

## 2024-05-15 DIAGNOSIS — F419 Anxiety disorder, unspecified: Secondary | ICD-10-CM

## 2024-05-15 DIAGNOSIS — F32A Depression, unspecified: Secondary | ICD-10-CM

## 2024-05-15 DIAGNOSIS — E46 Unspecified protein-calorie malnutrition: Secondary | ICD-10-CM

## 2024-05-15 DIAGNOSIS — E782 Mixed hyperlipidemia: Secondary | ICD-10-CM

## 2024-05-15 DIAGNOSIS — I10 Essential (primary) hypertension: Secondary | ICD-10-CM

## 2024-05-15 DIAGNOSIS — F17200 Nicotine dependence, unspecified, uncomplicated: Secondary | ICD-10-CM

## 2024-05-15 DIAGNOSIS — I609 Nontraumatic subarachnoid hemorrhage, unspecified: Secondary | ICD-10-CM | POA: Diagnosis not present

## 2024-05-15 NOTE — Patient Instructions (Signed)
 It was nice to see you today!  As we discussed in clinic:  - Continue your amlodipine  daily for your blood pressure.  I am sending you home with a blood pressure log so that you can check your blood pressure periodically.  If you have readings consistently 130/80 or greater, please call or send me a MyChart message so we can discuss increasing your dose of medication. - Continue your atorvastatin  20 mg daily for your cholesterol. -Over the next 4 weeks, continue taking your 50 mg of Zoloft  consistently every day and you can send me a MyChart message to let me know if your moods are improving or if you would like to increase the dose. -Please reach out to your preferred gynecologist to schedule your Pap smear. -As we discussed about your desire to gain weight: Please download the app MyFitnessPal.  You can use the free version, it is not necessary to do today for a premium subscription.  You will input your height and weight into the app and specify that your goal is to gain weight.  After that, the goal will be to track your calorie intake and ensure that you are meeting her calorie goals in order to gain weight.  I will plan on seeing you back in 6 months for a follow-up on the things we discussed today.  If you have any problems before your next visit feel free to message me via MyChart (minor issues or questions) or call the office, otherwise you may reach out to schedule an office visit.  Thank you! Meryl Acosta, PA-C

## 2024-05-15 NOTE — Assessment & Plan Note (Signed)
 Last lipid panel: LDL 83, HDL 68, triglycerides 237.  Continue atorvastatin  20 mg daily.  Will recheck lipid panel and CMP in 6 months and reevaluate need to increase/decrease medication.

## 2024-05-15 NOTE — Assessment & Plan Note (Signed)
 Stable. Continue gabapentin  600 mg 3 times daily as needed.

## 2024-05-15 NOTE — Assessment & Plan Note (Signed)
 Counseled patient today on the options of smoking cessation.  Discussed that she is not a candidate for Wellbutrin given her low weight.  Discussed other options including nicotine  replacement, however patient is not interested in NRT.  Declined low-dose chest CT.  Will revisit this discussion at her next visit.

## 2024-05-15 NOTE — Progress Notes (Signed)
 Established Patient Office Visit  Subjective   Patient ID: [name] Barnett, female    DOB: 1976-06-19  Age: 47 y.o. MRN: [id number]  Chief Complaint  Patient presents with   Annual Exam    HPI [name] Barnett is a 48 y.o. female presenting today for annual physical and follow up of hypertension, hyperlipidemia, mood. Patient overall reports that she is doing well.  Reports eating a well-balanced diet.  She does not participate in regular exercise at this time due to her physical disabilities.  She is sleeping well at night.  She reports that she is currently smoking 1 ppd. Denies alcohol or illicit drug use.  She does have other concerns to discuss today.   Patient expresses a desire to gain weight.  She has not been trying anything to gain weight specifically, however she was 95 pounds at her last visit and today she is 89 pounds.  Patient reports that ever since her stroke in 2018, she has been underweight.   Hypertension:  Pt denies chest pain, SOB, dizziness, edema, syncope, fatigue or heart palpitations. Taking amlodipine  5 mg daily, reports excellent compliance with treatment. Denies side effects. Reports that she has not been check her blood pressure at home.  Hyperlipidemia: tolerating atorvastatin  20 mg daily well with no myalgias or significant side effects.  The ASCVD Risk score (Arnett DK, et al., 2019) failed to calculate for the following reasons:   The patient has a prior MI or stroke diagnosis  Mood: Patient is here to follow up for depression and anxiety, currently managing with Zoloft  50 mg daily. Taking medication inconsistently without side effects.  Reports that she has only been taking this medication when she remembers it.  Reports that most days her mood is okay, however she does get more irritable sometimes.  She previously tried mirtazapine  and it caused excessive somnolence.  Denies SI/HI     ROS Per HPI.    Objective:     BP 136/83 (BP Location: Left Arm,  Patient Position: Sitting, Cuff Size: Normal)   Pulse 80   Ht 5\' 4"  (1.626 m)   Wt 89 lb 0.6 oz (40.4 kg)   LMP  (LMP Unknown)   SpO2 94%   BMI 15.28 kg/m    Physical Exam Constitutional:      General: She is not in acute distress.    Appearance: Normal appearance.  HENT:     Mouth/Throat:     Mouth: Mucous membranes are moist.     Pharynx: No oropharyngeal exudate or posterior oropharyngeal erythema.  Eyes:     Pupils: Pupils are equal, round, and reactive to light.  Cardiovascular:     Rate and Rhythm: Normal rate and regular rhythm.     Heart sounds: Normal heart sounds. No murmur heard.    No friction rub. No gallop.  Pulmonary:     Effort: Pulmonary effort is normal. No respiratory distress.     Breath sounds: Normal breath sounds.  Abdominal:     General: Abdomen is flat. Bowel sounds are normal.     Palpations: Abdomen is soft.  Musculoskeletal:        General: No swelling.     Cervical back: Neck supple.  Lymphadenopathy:     Cervical: No cervical adenopathy.  Skin:    General: Skin is warm and dry.  Neurological:     General: No focal deficit present.     Mental Status: She is alert.  Psychiatric:  Mood and Affect: Mood normal.        Behavior: Behavior normal.        Thought Content: Thought content normal.     No results found for any visits on 05/15/24.  Last CBC Lab Results  Component Value Date   WBC 11.8 (H) 05/08/2024   HGB 15.5 05/08/2024   HCT 48.9 (H) 05/08/2024   MCV 99 (H) 05/08/2024   MCH 31.4 05/08/2024   RDW 13.1 05/08/2024   PLT 376 05/08/2024   Last metabolic panel Lab Results  Component Value Date   GLUCOSE 88 05/08/2024   NA 143 05/08/2024   K 4.4 05/08/2024   CL 102 05/08/2024   CO2 28 05/08/2024   BUN 10 05/08/2024   CREATININE 0.80 05/08/2024   EGFR 91 05/08/2024   CALCIUM  9.0 05/08/2024   PHOS 3.7 02/14/2018   PROT 6.0 05/08/2024   ALBUMIN 3.7 (L) 05/08/2024   LABGLOB 2.3 05/08/2024   AGRATIO 1.8  04/17/2023   BILITOT <0.2 05/08/2024   ALKPHOS 151 (H) 05/08/2024   AST 18 05/08/2024   ALT 10 05/08/2024   ANIONGAP 8 08/30/2022   Last lipids Lab Results  Component Value Date   CHOL 190 05/08/2024   HDL 68 05/08/2024   LDLCALC 83 05/08/2024   TRIG 237 (H) 05/08/2024   CHOLHDL 2.8 05/08/2024   Last hemoglobin A1c Lab Results  Component Value Date   HGBA1C 5.3 05/08/2024   Last thyroid functions Lab Results  Component Value Date   TSH 0.464 05/08/2024      The ASCVD Risk score (Arnett DK, et al., 2019) failed to calculate for the following reasons:   Risk score cannot be calculated because patient has a medical history suggesting prior/existing ASCVD    Assessment & Plan:   s/p SAH (subarachnoid hemorrhage) ([name]) Assessment & Plan: S/p SAH in 2018.  Continue to follow treatment plan recommendations per neurosurgery.   Anxiety and depression Assessment & Plan: Continue Zoloft  50 mg daily.  Encouraged patient to trial taking this medication daily consistently for at least 4 weeks to notice the full effects.  Patient will send MyChart message in 4 to 6 weeks with an update on her mood after taking this medication consistently.   HTN, goal below 130/80 Assessment & Plan: BP goal <130/80 given history of CVA.  Blood pressure well above goal in office initially and slightly above on recheck.  Advised to check blood pressure at home and send me a MyChart message in 4 weeks updating me on her home readings.  Will adjust medication if consistently above goal or <100/60.  Last CMP normal.  Continue amlodipine  5 mg daily.  Will continue to monitor   Tobacco use disorder Assessment & Plan: Counseled patient today on the options of smoking cessation.  Discussed that she is not a candidate for Wellbutrin given her low weight.  Discussed other options including nicotine  replacement, however patient is not interested in NRT.  Declined low-dose chest CT.  Will revisit this  discussion at her next visit.   Mixed hyperlipidemia Assessment & Plan: Last lipid panel: LDL 83, HDL 68, triglycerides 237.  Continue atorvastatin  20 mg daily.  Will recheck lipid panel and CMP in 6 months and reevaluate need to increase/decrease medication.   Protein-calorie malnutrition, unspecified severity ([name]) Assessment & Plan: Patient has tried and failed Paxil  and Mirtazapine  in the past. Previously declined nutritionist referral.  Had a long discussion with patient regarding ways that she can work to gain  weight.  Advised patient to download a calorie tracking app and input her height and weight while specifying that her goal is to gain weight.  The app will calculate the amount of calories needed to sustain healthy weight gain.  Encouraged her to utilize this app and track her calorie intake to ensure that she is meeting her calorie and protein goals in order to gain weight.  Also recommended boost very high-calorie brand drinks to have on hand for days that she may not be able to eat enough to meet her calorie goals for weight gain.  Will continue to monitor and follow-up on weight in 6 months.   Neuritis-  R sided:  arm and leg/ body due to stroke Assessment & Plan: Stable. Continue gabapentin  600 mg 3 times daily as needed.       Return in about 6 months (around 11/14/2024) for Mood, HTN, HLD, Weight.    Odilia Bennett, PA-C

## 2024-05-15 NOTE — Assessment & Plan Note (Signed)
 Continue Zoloft  50 mg daily.  Encouraged patient to trial taking this medication daily consistently for at least 4 weeks to notice the full effects.  Patient will send MyChart message in 4 to 6 weeks with an update on her mood after taking this medication consistently.

## 2024-05-15 NOTE — Assessment & Plan Note (Signed)
 S/p SAH in 2018.  Continue to follow treatment plan recommendations per neurosurgery.

## 2024-05-15 NOTE — Assessment & Plan Note (Signed)
 BP goal <130/80 given history of CVA.  Blood pressure well above goal in office initially and slightly above on recheck.  Advised to check blood pressure at home and send me a MyChart message in 4 weeks updating me on her home readings.  Will adjust medication if consistently above goal or <100/60.  Last CMP normal.  Continue amlodipine  5 mg daily.  Will continue to monitor

## 2024-05-15 NOTE — Assessment & Plan Note (Signed)
 Patient has tried and failed Paxil  and Mirtazapine  in the past. Previously declined nutritionist referral.  Had a long discussion with patient regarding ways that she can work to gain weight.  Advised patient to download a calorie tracking app and input her height and weight while specifying that her goal is to gain weight.  The app will calculate the amount of calories needed to sustain healthy weight gain.  Encouraged her to utilize this app and track her calorie intake to ensure that she is meeting her calorie and protein goals in order to gain weight.  Also recommended boost very high-calorie brand drinks to have on hand for days that she may not be able to eat enough to meet her calorie goals for weight gain.  Will continue to monitor and follow-up on weight in 6 months.

## 2024-07-13 ENCOUNTER — Other Ambulatory Visit: Payer: Self-pay | Admitting: Family Medicine

## 2024-07-13 DIAGNOSIS — I725 Aneurysm of other precerebral arteries: Secondary | ICD-10-CM

## 2024-07-13 DIAGNOSIS — I69393 Ataxia following cerebral infarction: Secondary | ICD-10-CM

## 2024-07-13 DIAGNOSIS — M792 Neuralgia and neuritis, unspecified: Secondary | ICD-10-CM

## 2024-07-13 DIAGNOSIS — I69319 Unspecified symptoms and signs involving cognitive functions following cerebral infarction: Secondary | ICD-10-CM

## 2024-07-28 ENCOUNTER — Other Ambulatory Visit: Payer: Self-pay | Admitting: Family Medicine

## 2024-07-28 DIAGNOSIS — I10 Essential (primary) hypertension: Secondary | ICD-10-CM

## 2024-07-29 ENCOUNTER — Other Ambulatory Visit: Payer: Self-pay | Admitting: Family Medicine

## 2024-07-29 DIAGNOSIS — F419 Anxiety disorder, unspecified: Secondary | ICD-10-CM

## 2024-09-13 ENCOUNTER — Other Ambulatory Visit: Payer: Self-pay

## 2024-09-13 ENCOUNTER — Emergency Department (HOSPITAL_COMMUNITY)
Admission: EM | Admit: 2024-09-13 | Discharge: 2024-09-13 | Disposition: A | Discharge status: Home / Self Care | Attending: Emergency Medicine | Admitting: Emergency Medicine

## 2024-09-13 ENCOUNTER — Encounter (HOSPITAL_COMMUNITY): Payer: Self-pay | Admitting: Emergency Medicine

## 2024-09-13 ENCOUNTER — Emergency Department (HOSPITAL_COMMUNITY)

## 2024-09-13 DIAGNOSIS — K529 Noninfective gastroenteritis and colitis, unspecified: Secondary | ICD-10-CM | POA: Insufficient documentation

## 2024-09-13 DIAGNOSIS — E876 Hypokalemia: Secondary | ICD-10-CM | POA: Diagnosis not present

## 2024-09-13 DIAGNOSIS — D72829 Elevated white blood cell count, unspecified: Secondary | ICD-10-CM | POA: Insufficient documentation

## 2024-09-13 DIAGNOSIS — I10 Essential (primary) hypertension: Secondary | ICD-10-CM | POA: Insufficient documentation

## 2024-09-13 DIAGNOSIS — R1013 Epigastric pain: Secondary | ICD-10-CM | POA: Diagnosis present

## 2024-09-13 DIAGNOSIS — Z79899 Other long term (current) drug therapy: Secondary | ICD-10-CM | POA: Diagnosis not present

## 2024-09-13 LAB — CBC
HCT: 46.6 % — ABNORMAL HIGH (ref 36.0–46.0)
Hemoglobin: 15.2 g/dL — ABNORMAL HIGH (ref 12.0–15.0)
MCH: 29.3 pg (ref 26.0–34.0)
MCHC: 32.6 g/dL (ref 30.0–36.0)
MCV: 89.8 fL (ref 80.0–100.0)
Platelets: 337 K/uL (ref 150–400)
RBC: 5.19 MIL/uL — ABNORMAL HIGH (ref 3.87–5.11)
RDW: 15 % (ref 11.5–15.5)
WBC: 11.2 K/uL — ABNORMAL HIGH (ref 4.0–10.5)
nRBC: 0 % (ref 0.0–0.2)

## 2024-09-13 LAB — COMPREHENSIVE METABOLIC PANEL WITH GFR
ALT: 16 U/L (ref 0–44)
AST: 24 U/L (ref 15–41)
Albumin: 1.8 g/dL — ABNORMAL LOW (ref 3.5–5.0)
Alkaline Phosphatase: 127 U/L — ABNORMAL HIGH (ref 38–126)
Anion gap: 11 (ref 5–15)
BUN: 16 mg/dL (ref 6–20)
CO2: 17 mmol/L — ABNORMAL LOW (ref 22–32)
Calcium: 6.9 mg/dL — ABNORMAL LOW (ref 8.9–10.3)
Chloride: 109 mmol/L (ref 98–111)
Creatinine, Ser: 0.83 mg/dL (ref 0.44–1.00)
GFR, Estimated: >60 mL/min (ref >60)
Glucose, Bld: 69 mg/dL — ABNORMAL LOW (ref 70–99)
Potassium: 3.1 mmol/L — ABNORMAL LOW (ref 3.5–5.1)
Sodium: 137 mmol/L (ref 135–145)
Total Bilirubin: 0.7 mg/dL (ref 0.0–1.2)
Total Protein: 4.1 g/dL — ABNORMAL LOW (ref 6.5–8.1)

## 2024-09-13 LAB — URINALYSIS, ROUTINE W REFLEX MICROSCOPIC
Bilirubin Urine: NEGATIVE
Glucose, UA: NEGATIVE mg/dL
Hgb urine dipstick: NEGATIVE
Ketones, ur: 5 mg/dL — AB
Leukocytes,Ua: NEGATIVE
Nitrite: NEGATIVE
Protein, ur: NEGATIVE mg/dL
Specific Gravity, Urine: 1.024 (ref 1.005–1.030)
pH: 5 (ref 5.0–8.0)

## 2024-09-13 LAB — LIPASE, BLOOD: Lipase: 29 U/L (ref 11–51)

## 2024-09-13 MED ORDER — ONDANSETRON HCL 4 MG PO TABS: 4.0000 mg | ORAL_TABLET | Freq: Four times a day (QID) | ORAL | 0 refills | Status: DC

## 2024-09-13 MED ORDER — SODIUM CHLORIDE 0.9 % IV BOLUS
1000.0000 mL | Freq: Once | INTRAVENOUS | Status: AC
  Administered 2024-09-13: 1000 mL via INTRAVENOUS

## 2024-09-13 MED ORDER — IOHEXOL 350 MG/ML SOLN
75.0000 mL | Freq: Once | INTRAVENOUS | Status: AC | PRN
Start: 2024-09-13 — End: 2024-09-13
  Administered 2024-09-13: 75 mL via INTRAVENOUS

## 2024-09-13 NOTE — ED Triage Notes (Signed)
 Pt here from home with c/o abd pain and weakness along with n/v/d  cbg 128 received 1 liter of fluids given by ems

## 2024-09-13 NOTE — ED Provider Notes (Signed)
 [place] EMERGENCY DEPARTMENT AT [facility] Provider Note   CSN: [id number] Arrival date & time: 09/13/24  1454     Patient presents with: Abdominal Pain   [name] Barnett is a 48 y.o. female.  48 year old female presents to ED with complaints of abdominal pain starting Monday night.  Patient reports she was out to eat with her mother and following dinner she had watery diarrhea.  She has had multiple episodes of watery diarrhea since then and she has eaten minimal foods.  Last time eating some solid foods were yesterday she reports still drinking fluids.  She has associated periumbilical abdominal pain, left flank pain, dry heaving, and reported foul odors to urine.  Patient has significant history of stroke in 2018 where she has left-sided deficits and incontinence where she wears a diaper.  Patient also reports she cannot walk unassisted.  Other PMH includes cholecystectomy, stomach ulcer, and hypertension.     Prior to Admission medications   Medication Sig Start Date End Date Taking? Authorizing Provider  ondansetron  (ZOFRAN ) 4 MG tablet Take 1 tablet (4 mg total) by mouth every 6 (six) hours. 09/13/24  Yes Myriam Fonda RAMAN, PA-C  amLODipine  (NORVASC ) 5 MG tablet TAKE 1 TABLET (5 MG TOTAL) BY MOUTH DAILY. 07/31/24   Gayle Saddie JULIANNA, PA-C  atorvastatin  (LIPITOR) 20 MG tablet Take 1 tablet po QPM 09/16/23   Wallace Search A, PA  gabapentin  (NEURONTIN ) 600 MG tablet TAKE 1 TABLET BY MOUTH THREE TIMES A DAY 07/13/24   Gayle Saddie F, PA-C  Multiple Vitamin (MULTIVITAMIN WITH MINERALS) TABS tablet Take 1 tablet by mouth daily.    [provider]  sertraline  (ZOLOFT ) 50 MG tablet Take 1 tablet (50 mg total) by mouth as needed. 07/29/24   Gayle Saddie JULIANNA, PA-C    Allergies: Nsaids    Review of Systems  Gastrointestinal:  Positive for abdominal pain, diarrhea and nausea.  All other systems reviewed and are negative.   Updated Vital Signs BP 122/62   Pulse 75   Temp 97.7  F (36.5 C) (Oral)   Resp 17   LMP  (LMP Unknown)   SpO2 100%   Physical Exam Vitals and nursing note reviewed.  Constitutional:      Appearance: Normal appearance.  HENT:     Head: Normocephalic and atraumatic.     Nose: Nose normal.  Eyes:     Extraocular Movements: Extraocular movements intact.     Conjunctiva/sclera: Conjunctivae normal.     Pupils: Pupils are equal, round, and reactive to light.  Cardiovascular:     Rate and Rhythm: Normal rate.  Pulmonary:     Effort: Pulmonary effort is normal. No respiratory distress.     Breath sounds: Normal breath sounds.  Abdominal:     General: Abdomen is flat. Bowel sounds are normal. There is no distension.     Palpations: Abdomen is soft. There is no mass.     Tenderness: There is abdominal tenderness in the epigastric area and periumbilical area. There is left CVA tenderness. There is no right CVA tenderness. Negative signs include McBurney's sign.  Musculoskeletal:        General: Normal range of motion.     Cervical back: Normal range of motion.  Neurological:     General: No focal deficit present.     Mental Status: She is alert.  Psychiatric:        Mood and Affect: Mood normal.        Behavior:  Behavior normal.     (all labs ordered are listed, but only abnormal results are displayed) Labs Reviewed  COMPREHENSIVE METABOLIC PANEL WITH GFR - Abnormal; Notable for the following components:      Result Value   Potassium 3.1 (*)    CO2 17 (*)    Glucose, Bld 69 (*)    Calcium  6.9 (*)    Total Protein 4.1 (*)    Albumin 1.8 (*)    Alkaline Phosphatase 127 (*)    All other components within normal limits  CBC - Abnormal; Notable for the following components:   WBC 11.2 (*)    RBC 5.19 (*)    Hemoglobin 15.2 (*)    HCT 46.6 (*)    All other components within normal limits  URINALYSIS, ROUTINE W REFLEX MICROSCOPIC - Abnormal; Notable for the following components:   Color, Urine STRAW (*)    Ketones, ur 5 (*)     All other components within normal limits  GASTROINTESTINAL PANEL BY PCR, STOOL (REPLACES STOOL CULTURE)  LIPASE, BLOOD    EKG: EKG Interpretation Date/Time:  Sunday September 13 2024 15:08:37 EDT Ventricular Rate:  89 PR Interval:  116 QRS Duration:  88 QT Interval:  366 QTC Calculation: 445 R Axis:   91  Text Interpretation: Normal sinus rhythm Biatrial enlargement Rightward axis Pulmonary disease pattern When compared with ECG of 30-Aug-2022 02:18, PREVIOUS ECG IS PRESENT No significant changes Confirmed by Cottie Cough [phone]) on 09/14/2024 8:14:31 AM  Radiology: CT ABDOMEN PELVIS W CONTRAST Result Date: 09/13/2024 CLINICAL DATA:  Acute abdominal pain. EXAM: CT ABDOMEN AND PELVIS WITH CONTRAST TECHNIQUE: Multidetector CT imaging of the abdomen and pelvis was performed using the standard protocol following bolus administration of intravenous contrast. RADIATION DOSE REDUCTION: This exam was performed according to the departmental dose-optimization program which includes automated exposure control, adjustment of the mA and/or kV according to patient size and/or use of iterative reconstruction technique. CONTRAST:  75mL OMNIPAQUE  IOHEXOL  350 MG/ML SOLN COMPARISON:  Remote CT 09/11/2011 FINDINGS: Lower chest: No acute airspace disease.  No pleural effusion. Hepatobiliary: No focal liver abnormality. Cholecystectomy. Mild intra and extrahepatic biliary ductal prominence is likely related to prior cholecystectomy. Common bile duct measures 10 mm. Pancreas: Not well visualized in the absence of enteric contrast and paucity of body fat. Pancreatic parenchyma appears atrophic with scattered calcifications in the body and head. No pancreatic inflammation. Spleen: Normal in size without focal abnormality. Adrenals/Urinary Tract: No adrenal nodule. Extrarenal pelvis configuration of the left kidney. No perinephric inflammation. No renal calculi. Unremarkable urinary bladder. Stomach/Bowel: Bowel  assessment is limited in the absence of enteric contrast and paucity of intra-abdominal fat. Lobulated gastric wall thickening. Suspected postsurgical change in the distal stomach. Small bowel appears diffusely edematous and hyperemic. Air in fluid within small bowel without abnormal distension. No pneumatosis or obstruction. Wall thickening of the cecum and ascending colon. There is formed stool within the distal colon, equivocal sigmoid hyperemia. The appendix is normal. Vascular/Lymphatic: Aortic atherosclerosis, advanced for age. Calcified is well as irregular noncalcified atheromatous plaque. Plaque causes approximately 50% luminal narrowing in the infrarenal aorta. The portal, splenic, and mesenteric veins are patent. No bulky adenopathy. Reproductive: Nonacute. Other: Suspect minimal free fluid in the pelvis. No free air. Postsurgical change of the anterior abdominal wall. No abdominal wall hernia. Small surgical clips in the central upper abdomen and adjacent to the distal esophagus. Musculoskeletal: There are no acute or suspicious osseous abnormalities. Age advanced degenerative change of the right  hip. IMPRESSION: 1. Multifocal bowel wall thickening throughout the abdomen and pelvis. Large portions of small bowel appear edematous and hyperemic, with additional wall thickening involving the stomach and ascending colon. Findings are suspicious for diffuse enteritis. 2. Irregularity calcified and noncalcified plaque in the abdominal aorta, which causes approximately 50% luminal narrowing in the infrarenal portion. Aortic Atherosclerosis (ICD10-I70.0). Electronically Signed   By: Andrea Gasman M.D.   On: 09/13/2024 17:56     Procedures   Medications Ordered in the ED  sodium chloride  0.9 % bolus 1,000 mL (0 mLs Intravenous Stopped 09/13/24 1809)  iohexol  (OMNIPAQUE ) 350 MG/ML injection 75 mL (75 mLs Intravenous Contrast Given 09/13/24 1738)   48 y.o. female presents to the ED with complaints of  abdominal pain, diarrhea, dry heaving with nausea, this involves an extensive number of treatment options, and is a complaint that carries with it a high risk of complications and morbidity.  The differential diagnosis includes appendicitis, colitis, bowel obstruction, AAA, pyelonephritis, nephrolithiasis, UTI, ACS (Ddx)  On arrival pt is nontoxic, vitals unremarkable. Exam significant for periumbilical pain and left flank pain  Additional history obtained from chart review patient is followed by primary care for soaks for management of hypertension hyperlipidemia patient is prescribed amlodipine  and atorvastatin   Lab Tests:  I Ordered, reviewed, and interpreted labs, which included: CMP, CBC, UA, C. difficile, gastrointestinal panel, lipase  Imaging Studies ordered:  I ordered imaging studies which included CT abdomen pelvis with contrast, I independently visualized and interpreted imaging which showed enteritis and abdominal aorta narrowing.   ED Course:   48 year old female presents ED with complaints of abdominal pain, diarrhea, nausea, dry heaving since Monday after dinner.  See HPI for full story.  Patient is sitting comfortably in ED bed in no acute distress nontoxic-appearing.  Patient is afebrile.  Patient has deficits from stroke in 2018 on left sided with incontinence.  Patient is not able to walk without assistance.  She has decree strength in her left arm and leg.  Patient wears a diaper for incontinence issues.  Patient also has significant history of stomach ulcer, cholecystectomy, hypertension.  On abdominal exam patient has pain to palpation in the epigastric and umbilicus.  Negative McBurney's point.  No obvious rashes or distention.  No swelling in the distal extremities.  Patient denies any chest pain or shortness of breath lungs are clear to auscultation all fields.  Is not tachypneic or febrile on initial exam.  Patient denies any recent illnesses, fever, headache, or sick  contacts. She has been able to drink fluids and ate yesterday. No reported vomiting. Patient denies any nausea at this time and was advised to report any new symptoms for further management.   CT suggestive of enteritis abdominal aorta narrowing.  Discussed with attending and advised to have patient follow-up with vascular for further recommendations.   We attempted to get stool sample but patient was not able to provide one. Lab work was unremarkable and on reassessment patient denies any new symptoms. Patient is sitting comfortably in ED bed with company at bedside. Patient was advised of findings and given zofran  prescription if nausea would happen to occur again at home. Patient was given strict return precautions and advised to follow up with vascular. Patient agreed to treatment plan and was comfortable with discharge.   Portions of this note were generated with Scientist, clinical (histocompatibility and immunogenetics). Dictation errors may occur despite best attempts at proofreading.    Final diagnoses:  Enteritis    ED Discharge  Orders          Ordered    ondansetron  (ZOFRAN ) 4 MG tablet  Every 6 hours        09/13/24 1835               Jassmine Vandruff S, PA-C 09/15/24 [id number]    Yolande Lamar BROCKS, MD 09/18/24 [phone]

## 2024-09-13 NOTE — ED Notes (Signed)
 Per pt she is unable to provide a stool sample at this time.

## 2024-09-13 NOTE — ED Notes (Signed)
 PT discharged to home with significant other.  Breathing even and unlabored.  PT educated on follow up and prescription medications.  No further questions at this time.

## 2024-09-13 NOTE — Discharge Instructions (Addendum)
 CT scan today was suggestive of inflammation of the bowels and narrowing of the abdominal aorta.  I have referred you to a vascular doctor for further management and recommendations.  Urinalysis was reassuring and not suggestive of infection.  I will prescribe Zofran  to help with nausea as needed.  If symptoms worsen or you experience other concerning symptoms such as chest pain, shortness of breath, fevers, fainting, or confusion return to ED for further evaluation.

## 2024-10-08 ENCOUNTER — Emergency Department (HOSPITAL_COMMUNITY)
Admission: EM | Admit: 2024-10-08 | Discharge: 2024-10-10 | Disposition: E | Attending: Emergency Medicine | Admitting: Emergency Medicine

## 2024-10-08 DIAGNOSIS — R092 Respiratory arrest: Secondary | ICD-10-CM | POA: Insufficient documentation

## 2024-10-08 DIAGNOSIS — R5383 Other fatigue: Secondary | ICD-10-CM | POA: Diagnosis not present

## 2024-10-08 DIAGNOSIS — R627 Adult failure to thrive: Secondary | ICD-10-CM | POA: Diagnosis not present

## 2024-10-08 LAB — CBG MONITORING, ED: Glucose-Capillary: 70 mg/dL (ref 70–99)

## 2024-10-08 MED ORDER — EPINEPHRINE 1 MG/10ML IV SOSY
PREFILLED_SYRINGE | INTRAVENOUS | Status: AC | PRN
Start: 2024-10-08 — End: 2024-10-08
  Administered 2024-10-08 (×4): 1 mg via INTRAVENOUS

## 2024-10-08 MED ORDER — ETOMIDATE 2 MG/ML IV SOLN
INTRAVENOUS | Status: AC | PRN
  Administered 2024-10-08: 5 mg via INTRAVENOUS

## 2024-10-08 MED ORDER — NOREPINEPHRINE 4 MG/250ML-% IV SOLN
0.0000 ug/min | INTRAVENOUS | Status: DC
  Administered 2024-10-08: 10 ug/min via INTRAVENOUS

## 2024-10-08 MED ORDER — FENTANYL CITRATE (PF) 50 MCG/ML IJ SOSY
PREFILLED_SYRINGE | INTRAMUSCULAR | Status: AC
  Filled 2024-10-08: qty 1

## 2024-10-08 MED ORDER — FENTANYL CITRATE (PF) 50 MCG/ML IJ SOSY
PREFILLED_SYRINGE | INTRAMUSCULAR | Status: AC | PRN
  Administered 2024-10-08: 50 ug via INTRAVENOUS

## 2024-10-10 NOTE — ED Triage Notes (Signed)
 EDP is discussing code status with family.

## 2024-10-10 NOTE — ED Provider Notes (Addendum)
 [place] EMERGENCY DEPARTMENT AT [facility] Provider Note   CSN: [id number] Arrival date & time: 10-09-24  1035     Patient presents with: unresponsive   [name] Barnett is a 48 y.o. female.   49 yo F with a chief complaints of fatigue.  This was reported by EMS as the patient is unable to provide history.  Stated that over the past week she has not been eating and drinking and has been progressively fatigued.  Initially talking with EMS but became unresponsive.  Patient assisted ventilations en route, right before she became unresponsive she had mentioned to EMS that she would not want heroic measures performed and that included CPR and intubation.  Level 5 caveat acuity of condition.        Prior to Admission medications   Medication Sig Start Date End Date Taking? Authorizing Provider  dexamethasone  0.5 MG/5ML elixir PLEASE SEE ATTACHED FOR DETAILED DIRECTIONS 09/28/24  Yes [provider]  lidocaine  (XYLOCAINE ) 2 % solution PLEASE SEE ATTACHED FOR DETAILED DIRECTIONS 09/28/24  Yes [provider]  nitrofurantoin, macrocrystal-monohydrate, (MACROBID) 100 MG capsule Take 100 mg by mouth 2 (two) times daily. 09/12/24  Yes [provider]  amLODipine  (NORVASC ) 5 MG tablet TAKE 1 TABLET (5 MG TOTAL) BY MOUTH DAILY. 07/31/24   Gayle Saddie FALCON, PA-C  atorvastatin  (LIPITOR) 20 MG tablet Take 1 tablet po QPM 09/16/23   Wallace Search A, PA  gabapentin  (NEURONTIN ) 600 MG tablet TAKE 1 TABLET BY MOUTH THREE TIMES A DAY 07/13/24   Gayle Saddie F, PA-C  Multiple Vitamin (MULTIVITAMIN WITH MINERALS) TABS tablet Take 1 tablet by mouth daily.    [provider]  ondansetron  (ZOFRAN ) 4 MG tablet Take 1 tablet (4 mg total) by mouth every 6 (six) hours. 09/13/24   Myriam Fonda RAMAN, PA-C  sertraline  (ZOLOFT ) 50 MG tablet Take 1 tablet (50 mg total) by mouth as needed. 07/29/24   Gayle Saddie FALCON, PA-C    Allergies: Nsaids    Review of Systems  Updated Vital  Signs BP (!) 81/50   Resp (!) 33   Ht 5' 4 (1.626 m)   LMP  (LMP Unknown)   BMI 15.28 kg/m   Physical Exam Vitals and nursing note reviewed.  Constitutional:      General: She is in acute distress.     Appearance: She is not diaphoretic.     Comments: Chronically ill-appearing.  Cachectic.  HENT:     Head: Normocephalic and atraumatic.  Eyes:     Comments: Pupils were 4 mm and unreactive  Pulmonary:     Effort: Pulmonary effort is normal.     Breath sounds: No wheezing or rales.  Abdominal:     General: There is no distension.     Palpations: Abdomen is soft.     Tenderness: There is no abdominal tenderness.     Comments: Old midline abdominal scar  Musculoskeletal:        General: No tenderness.     Cervical back: Normal range of motion and neck supple.  Skin:    General: Skin is dry.     Comments: cool  Psychiatric:        Behavior: Behavior normal.     (all labs ordered are listed, but only abnormal results are displayed) Labs Reviewed  CBG MONITORING, ED    EKG: None  Radiology: No results found.   Procedure Name: Intubation Date/Time: 09-Oct-2024 11:38 AM  Performed by: Emil Share, DOPre-anesthesia Checklist:  Patient identified, Patient being monitored, Emergency Drugs available, Timeout performed and Suction available Oxygen Delivery Method: Non-rebreather mask Preoxygenation: Pre-oxygenation with 100% oxygen Induction Type: Rapid sequence Ventilation: Mask ventilation without difficulty Laryngoscope Size: Glidescope Grade View: Grade I Tube size: 7.5 mm Number of attempts: 1 Airway Equipment and Method: Video-laryngoscopy Placement Confirmation: ETT inserted through vocal cords under direct vision, CO2 detector and Breath sounds checked- equal and bilateral Secured at: 23 cm Tube secured with: ETT holder Dental Injury: Teeth and Oropharynx as per pre-operative assessment  Difficulty Due To: Difficulty was anticipated Future Recommendations:  Recommend- induction with short-acting agent, and alternative techniques readily available    .Critical Care  Performed by: Emil Share, DO Authorized by: Emil Share, DO   Critical care provider statement:    Critical care time (minutes):  35   Critical care time was exclusive of:  Separately billable procedures and treating other patients   Critical care was time spent personally by me on the following activities:  Development of treatment plan with patient or surrogate, discussions with consultants, evaluation of patient's response to treatment, examination of patient, ordering and review of laboratory studies, ordering and review of radiographic studies, ordering and performing treatments and interventions, pulse oximetry, re-evaluation of patient's condition and review of old charts   Care discussed with: admitting provider    Cardiopulmonary Resuscitation (CPR) Procedure Note Directed/Performed by: Toribio Belvie Emil I personally directed ancillary staff and/or performed CPR in an effort to regain return of spontaneous circulation and to maintain cardiac, neuro and systemic perfusion.     EMERGENCY DEPARTMENT US  CARDIAC EXAM Study: Limited Ultrasound of the Heart and Pericardium  INDICATIONS:Cardiac arrest Multiple views of the heart and pericardium were obtained in real-time with a multi-frequency probe.  PERFORMED AB:Fbdzoq IMAGES ARCHIVED?: Yes LIMITATIONS:  Emergent procedure VIEWS USED: Subcostal 4 chamber INTERPRETATION: Cardiac activity absent, Pericardial effusioin absent, and Agonal contractions noted    Medications Ordered in the ED  norepinephrine (LEVOPHED) 4mg  in (0.016 mg/mL) premix infusion (0 mcg/min Intravenous Stopped 21-Jan-2024 1111)  fentaNYL  (SUBLIMAZE ) 50 MCG/ML injection (has no administration in time range)  etomidate (AMIDATE) injection (5 mg Intravenous Given 2024-10-24 1051)  EPINEPHrine  (ADRENALIN ) 1 MG/10ML injection (1 mg Intravenous Given  24-Oct-2024 1102)  fentaNYL  (SUBLIMAZE ) injection (50 mcg Intravenous Given 2024/10/24 1056)                                    Medical Decision Making Risk Prescription drug management.   48 yo F with a chief complaint of failure to thrive.  Going on for at least the past week.  On arrival patient is chronically ill appearing requiring assisted respirations.   No obvious pupillary response.  No obvious response to pain.  She did mention to EMS that she would not want CPR performed.  Does not have a DNR form with her.  Her records were reviewed no obvious DNR has been filed.  I discussed the case with the patient's husband and daughter.  Verified the patient has not been doing well.  Progressive weight loss since a reported diagnosis of skin cancer.  Has had a stroke in the past.  A long discussion with family, felt the patient would want CPR.  CPR was then initiated.  Approximately 15 minutes had 1 episode where pulses had returned but only for seconds.  Bedside ultrasound with no significant cardiac activity.  PEA throughout the CPR.  Despite 4 doses of epinephrine , CPR, intubation, fluid resuscitation, Norepinephrine infusion no meaningful improvement.  Code was called at 1107.  Family arrived and are very upset.  They expressed anger at their visit 3 weeks ago and how they felt that the patient should have had more done at that visit.  I did contact nursing leadership about my concern of the state of the family.  They will see family at bedside.  I did discuss the case with Ezzard, medical examiner.  He reviewed the patient's chart and felt that she had been sick for some time.  Thought likely natural cause of death.  Did not feel that would warrant an ME case.  Recommended PCP for death certificate.  The patients results and plan were reviewed and discussed.   Any x-rays performed were independently reviewed by myself.   Differential diagnosis were considered with the presenting  HPI.  Medications  norepinephrine (LEVOPHED) 4mg  in (0.016 mg/mL) premix infusion (0 mcg/min Intravenous Stopped 11/01/24 1111)  fentaNYL  (SUBLIMAZE ) 50 MCG/ML injection (has no administration in time range)  etomidate (AMIDATE) injection (5 mg Intravenous Given 2024/11/01 1051)  EPINEPHrine  (ADRENALIN ) 1 MG/10ML injection (1 mg Intravenous Given 11-01-24 1102)  fentaNYL  (SUBLIMAZE ) injection (50 mcg Intravenous Given November 01, 2024 1056)    Vitals:   Nov 01, 2024 1052 2024-11-01 1057 2024/11/01 1100 11-01-2024 1105  BP:   (!) 81/50   Resp: (!) 68 14 (!) 21 (!) 33  Height:        Final diagnoses:  Respiratory arrest ([name])  Failure to thrive in adult        Final diagnoses:  Respiratory arrest ([name])  Failure to thrive in adult    ED Discharge Orders     None           Emil Share, DO Nov 01, 2024 1544

## 2024-10-10 NOTE — ED Notes (Signed)
 Medical examiner  Manuelita kerns called per dr. Emil

## 2024-10-10 NOTE — ED Triage Notes (Signed)
 PT BIB [facility] from her sisters house has recent dx of viral infection and skin cancer.  Was tx with ABX in hospital last week.  Per family pt ambulated to the bed last night but has not been very responsive this AM. GCS 15 on scene but very weak, and rapidly declined to GCS of 3 enroute.  Multiple falls but not in the last 2 weeks.  Per EMS pt expressed desire not to have CPR or intubation but does not have a DNR or MOST.   Unable to auscultate BP, HR 150 on scene, HR 54 on arrival CBG 154, RR 40, ETC02 16  20G R. AC

## 2024-10-10 NOTE — ED Notes (Signed)
 Attempted to call family confirm arrival to hospital to see pt, both numbers went straight to voicemail.

## 2024-10-10 NOTE — ED Notes (Signed)
 Pt family very unhappy that pt passed away after being seen here 3 weeks ago.  They are requesting information about the previous visit and this visit.  What tests were done, what staff cared for her etc.   I provided them information for Medical Records so that they could pursue that.

## 2024-10-10 NOTE — ED Notes (Signed)
 TOD 1106

## 2024-10-10 NOTE — ED Notes (Signed)
 Pts 2 rings taken by family.

## 2024-10-10 DEATH — deceased

## 2024-11-09 ENCOUNTER — Other Ambulatory Visit

## 2024-11-16 ENCOUNTER — Ambulatory Visit
